# Patient Record
Sex: Female | Born: 1950 | Race: White | Hispanic: No | Marital: Married | Smoking: Former smoker
Health system: Southern US, Community
[De-identification: ages and names within clinical notes are randomized; demographics above are authoritative.]

## PROBLEM LIST (undated history)

## (undated) DIAGNOSIS — E039 Hypothyroidism, unspecified: Secondary | ICD-10-CM

## (undated) DIAGNOSIS — M349 Systemic sclerosis, unspecified: Secondary | ICD-10-CM

## (undated) DIAGNOSIS — R519 Headache, unspecified: Secondary | ICD-10-CM

## (undated) DIAGNOSIS — I10 Essential (primary) hypertension: Secondary | ICD-10-CM

## (undated) DIAGNOSIS — F329 Major depressive disorder, single episode, unspecified: Secondary | ICD-10-CM

## (undated) DIAGNOSIS — F319 Bipolar disorder, unspecified: Secondary | ICD-10-CM

## (undated) DIAGNOSIS — Z87442 Personal history of urinary calculi: Secondary | ICD-10-CM

## (undated) DIAGNOSIS — E042 Nontoxic multinodular goiter: Secondary | ICD-10-CM

## (undated) DIAGNOSIS — M1990 Unspecified osteoarthritis, unspecified site: Secondary | ICD-10-CM

## (undated) DIAGNOSIS — J309 Allergic rhinitis, unspecified: Secondary | ICD-10-CM

## (undated) DIAGNOSIS — Z923 Personal history of irradiation: Secondary | ICD-10-CM

## (undated) DIAGNOSIS — J189 Pneumonia, unspecified organism: Secondary | ICD-10-CM

## (undated) DIAGNOSIS — L4050 Arthropathic psoriasis, unspecified: Secondary | ICD-10-CM

## (undated) DIAGNOSIS — Z8781 Personal history of (healed) traumatic fracture: Secondary | ICD-10-CM

## (undated) DIAGNOSIS — I4510 Unspecified right bundle-branch block: Secondary | ICD-10-CM

## (undated) DIAGNOSIS — Z9889 Other specified postprocedural states: Secondary | ICD-10-CM

## (undated) DIAGNOSIS — F32A Depression, unspecified: Secondary | ICD-10-CM

## (undated) DIAGNOSIS — F4310 Post-traumatic stress disorder, unspecified: Secondary | ICD-10-CM

## (undated) DIAGNOSIS — M810 Age-related osteoporosis without current pathological fracture: Secondary | ICD-10-CM

## (undated) DIAGNOSIS — Z973 Presence of spectacles and contact lenses: Secondary | ICD-10-CM

## (undated) DIAGNOSIS — G4730 Sleep apnea, unspecified: Secondary | ICD-10-CM

## (undated) DIAGNOSIS — E7800 Pure hypercholesterolemia, unspecified: Secondary | ICD-10-CM

## (undated) DIAGNOSIS — I5032 Chronic diastolic (congestive) heart failure: Secondary | ICD-10-CM

## (undated) DIAGNOSIS — IMO0001 Reserved for inherently not codable concepts without codable children: Secondary | ICD-10-CM

## (undated) DIAGNOSIS — E119 Type 2 diabetes mellitus without complications: Secondary | ICD-10-CM

## (undated) DIAGNOSIS — K219 Gastro-esophageal reflux disease without esophagitis: Secondary | ICD-10-CM

## (undated) DIAGNOSIS — D509 Iron deficiency anemia, unspecified: Secondary | ICD-10-CM

## (undated) DIAGNOSIS — E1340 Other specified diabetes mellitus with diabetic neuropathy, unspecified: Secondary | ICD-10-CM

## (undated) DIAGNOSIS — R112 Nausea with vomiting, unspecified: Secondary | ICD-10-CM

## (undated) DIAGNOSIS — H269 Unspecified cataract: Secondary | ICD-10-CM

## (undated) DIAGNOSIS — C801 Malignant (primary) neoplasm, unspecified: Secondary | ICD-10-CM

## (undated) DIAGNOSIS — Z8739 Personal history of other diseases of the musculoskeletal system and connective tissue: Secondary | ICD-10-CM

## (undated) DIAGNOSIS — N200 Calculus of kidney: Secondary | ICD-10-CM

## (undated) DIAGNOSIS — R51 Headache: Secondary | ICD-10-CM

## (undated) HISTORY — DX: Systemic sclerosis, unspecified: M34.9

## (undated) HISTORY — DX: Hypothyroidism, unspecified: E03.9

## (undated) HISTORY — DX: Unspecified cataract: H26.9

## (undated) HISTORY — DX: Age-related osteoporosis without current pathological fracture: M81.0

## (undated) HISTORY — PX: KNEE ARTHROSCOPY WITH MEDIAL MENISECTOMY: SHX5651

## (undated) HISTORY — DX: Chronic diastolic (congestive) heart failure: I50.32

## (undated) HISTORY — DX: Morbid (severe) obesity due to excess calories: E66.01

## (undated) HISTORY — PX: EXCISIONAL HEMORRHOIDECTOMY: SHX1541

## (undated) HISTORY — DX: Allergic rhinitis, unspecified: J30.9

## (undated) HISTORY — PX: JOINT REPLACEMENT: SHX530

## (undated) HISTORY — DX: Arthropathic psoriasis, unspecified: L40.50

## (undated) HISTORY — DX: Reserved for inherently not codable concepts without codable children: IMO0001

## (undated) HISTORY — PX: BREAST SURGERY: SHX581

## (undated) HISTORY — DX: Nontoxic multinodular goiter: E04.2

## (undated) HISTORY — DX: Bipolar disorder, unspecified: F31.9

## (undated) HISTORY — DX: Pure hypercholesterolemia, unspecified: E78.00

## (undated) HISTORY — PX: BREAST EXCISIONAL BIOPSY: SUR124

---

## 1970-04-05 HISTORY — PX: TUBAL LIGATION: SHX77

## 1974-04-05 HISTORY — PX: ABDOMINAL HYSTERECTOMY: SHX81

## 1978-04-05 HISTORY — PX: LEFT OOPHORECTOMY: SHX1961

## 1983-04-06 HISTORY — PX: CHOLECYSTECTOMY: SHX55

## 1988-04-05 HISTORY — PX: OTHER SURGICAL HISTORY: SHX169

## 1988-08-03 HISTORY — PX: OTHER SURGICAL HISTORY: SHX169

## 1989-08-03 HISTORY — PX: OTHER SURGICAL HISTORY: SHX169

## 1990-04-05 HISTORY — PX: OTHER SURGICAL HISTORY: SHX169

## 1995-04-06 HISTORY — PX: OTHER SURGICAL HISTORY: SHX169

## 1997-07-08 ENCOUNTER — Encounter: Admission: RE | Admit: 1997-07-08 | Discharge: 1997-10-06 | Payer: Self-pay | Admitting: *Deleted

## 1997-11-20 ENCOUNTER — Encounter: Admission: RE | Admit: 1997-11-20 | Discharge: 1998-02-18 | Payer: Self-pay | Admitting: *Deleted

## 1998-03-24 ENCOUNTER — Encounter: Admission: RE | Admit: 1998-03-24 | Discharge: 1998-06-22 | Payer: Self-pay | Admitting: *Deleted

## 1998-04-05 HISTORY — PX: INCISIONAL BREAST BIOPSY: SHX1812

## 1998-07-14 ENCOUNTER — Other Ambulatory Visit: Admission: RE | Admit: 1998-07-14 | Discharge: 1998-07-14 | Payer: Self-pay | Admitting: *Deleted

## 1998-07-23 ENCOUNTER — Encounter: Admission: RE | Admit: 1998-07-23 | Discharge: 1998-10-21 | Payer: Self-pay | Admitting: *Deleted

## 1998-10-13 ENCOUNTER — Ambulatory Visit (HOSPITAL_COMMUNITY): Admission: RE | Admit: 1998-10-13 | Discharge: 1998-10-13 | Payer: Self-pay | Admitting: *Deleted

## 1998-10-22 ENCOUNTER — Encounter: Admission: RE | Admit: 1998-10-22 | Discharge: 1998-11-03 | Payer: Self-pay | Admitting: *Deleted

## 1999-04-06 HISTORY — PX: CARDIAC CATHETERIZATION: SHX172

## 1999-11-04 ENCOUNTER — Encounter: Admission: RE | Admit: 1999-11-04 | Discharge: 1999-11-04 | Payer: Self-pay | Admitting: *Deleted

## 1999-11-04 ENCOUNTER — Encounter: Payer: Self-pay | Admitting: *Deleted

## 1999-11-08 ENCOUNTER — Other Ambulatory Visit: Admission: RE | Admit: 1999-11-08 | Discharge: 1999-11-08 | Payer: Self-pay | Admitting: *Deleted

## 1999-12-15 ENCOUNTER — Ambulatory Visit (HOSPITAL_COMMUNITY): Admission: RE | Admit: 1999-12-15 | Discharge: 1999-12-15 | Payer: Self-pay | Admitting: Cardiology

## 2000-12-30 ENCOUNTER — Encounter: Admission: RE | Admit: 2000-12-30 | Discharge: 2000-12-30 | Payer: Self-pay | Admitting: *Deleted

## 2000-12-30 ENCOUNTER — Encounter: Payer: Self-pay | Admitting: *Deleted

## 2001-02-01 ENCOUNTER — Other Ambulatory Visit: Admission: RE | Admit: 2001-02-01 | Discharge: 2001-02-01 | Payer: Self-pay | Admitting: *Deleted

## 2001-08-01 ENCOUNTER — Encounter: Payer: Self-pay | Admitting: *Deleted

## 2001-08-01 ENCOUNTER — Encounter: Admission: RE | Admit: 2001-08-01 | Discharge: 2001-08-01 | Payer: Self-pay | Admitting: *Deleted

## 2002-01-09 ENCOUNTER — Encounter: Admission: RE | Admit: 2002-01-09 | Discharge: 2002-01-09 | Payer: Self-pay

## 2002-02-19 ENCOUNTER — Other Ambulatory Visit: Admission: RE | Admit: 2002-02-19 | Discharge: 2002-02-19 | Payer: Self-pay | Admitting: Family Medicine

## 2002-02-22 ENCOUNTER — Encounter: Admission: RE | Admit: 2002-02-22 | Discharge: 2002-02-22 | Payer: Self-pay

## 2003-01-17 ENCOUNTER — Encounter: Admission: RE | Admit: 2003-01-17 | Discharge: 2003-01-17 | Payer: Self-pay

## 2003-03-21 ENCOUNTER — Other Ambulatory Visit: Admission: RE | Admit: 2003-03-21 | Discharge: 2003-04-01 | Payer: Self-pay

## 2004-01-21 ENCOUNTER — Encounter: Admission: RE | Admit: 2004-01-21 | Discharge: 2004-01-21 | Payer: Self-pay | Admitting: Physician Assistant

## 2004-09-03 LAB — HM COLONOSCOPY: HM Colonoscopy: NORMAL

## 2005-02-18 ENCOUNTER — Encounter: Admission: RE | Admit: 2005-02-18 | Discharge: 2005-02-18 | Payer: Self-pay | Admitting: Physician Assistant

## 2006-01-05 ENCOUNTER — Encounter: Admission: RE | Admit: 2006-01-05 | Discharge: 2006-01-05 | Payer: Self-pay | Admitting: Family Medicine

## 2006-02-21 ENCOUNTER — Encounter: Admission: RE | Admit: 2006-02-21 | Discharge: 2006-02-21 | Payer: Self-pay | Admitting: Family Medicine

## 2006-07-26 ENCOUNTER — Encounter: Admission: RE | Admit: 2006-07-26 | Discharge: 2006-07-26 | Payer: Self-pay | Admitting: Family Medicine

## 2006-08-26 ENCOUNTER — Encounter: Admission: RE | Admit: 2006-08-26 | Discharge: 2006-08-26 | Payer: Self-pay | Admitting: Family Medicine

## 2007-03-14 ENCOUNTER — Encounter: Admission: RE | Admit: 2007-03-14 | Discharge: 2007-03-14 | Payer: Self-pay | Admitting: Family Medicine

## 2007-03-22 ENCOUNTER — Encounter: Admission: RE | Admit: 2007-03-22 | Discharge: 2007-03-22 | Payer: Self-pay | Admitting: Family Medicine

## 2007-04-10 ENCOUNTER — Encounter: Admission: RE | Admit: 2007-04-10 | Discharge: 2007-04-10 | Payer: Self-pay | Admitting: Family Medicine

## 2007-11-02 ENCOUNTER — Encounter: Admission: RE | Admit: 2007-11-02 | Discharge: 2007-11-02 | Payer: Self-pay | Admitting: Family Medicine

## 2007-11-15 ENCOUNTER — Encounter: Admission: RE | Admit: 2007-11-15 | Discharge: 2007-11-15 | Payer: Self-pay | Admitting: Internal Medicine

## 2008-03-20 ENCOUNTER — Encounter: Admission: RE | Admit: 2008-03-20 | Discharge: 2008-03-20 | Payer: Self-pay | Admitting: Family Medicine

## 2008-06-10 ENCOUNTER — Ambulatory Visit: Payer: Self-pay | Admitting: Endocrinology

## 2008-06-10 DIAGNOSIS — J309 Allergic rhinitis, unspecified: Secondary | ICD-10-CM | POA: Insufficient documentation

## 2008-06-10 DIAGNOSIS — I10 Essential (primary) hypertension: Secondary | ICD-10-CM | POA: Insufficient documentation

## 2008-06-10 DIAGNOSIS — F319 Bipolar disorder, unspecified: Secondary | ICD-10-CM | POA: Insufficient documentation

## 2008-06-10 DIAGNOSIS — E1159 Type 2 diabetes mellitus with other circulatory complications: Secondary | ICD-10-CM | POA: Insufficient documentation

## 2008-06-10 DIAGNOSIS — E039 Hypothyroidism, unspecified: Secondary | ICD-10-CM | POA: Insufficient documentation

## 2008-06-10 DIAGNOSIS — E7800 Pure hypercholesterolemia, unspecified: Secondary | ICD-10-CM | POA: Insufficient documentation

## 2008-06-10 DIAGNOSIS — Z780 Asymptomatic menopausal state: Secondary | ICD-10-CM | POA: Insufficient documentation

## 2008-06-10 DIAGNOSIS — I152 Hypertension secondary to endocrine disorders: Secondary | ICD-10-CM | POA: Insufficient documentation

## 2008-06-11 ENCOUNTER — Telehealth (INDEPENDENT_AMBULATORY_CARE_PROVIDER_SITE_OTHER): Payer: Self-pay | Admitting: *Deleted

## 2008-06-24 ENCOUNTER — Ambulatory Visit: Payer: Self-pay | Admitting: Endocrinology

## 2008-07-25 ENCOUNTER — Ambulatory Visit: Payer: Self-pay | Admitting: Endocrinology

## 2008-07-26 LAB — HM DIABETES EYE EXAM: HM Diabetic Eye Exam: NORMAL

## 2008-09-03 ENCOUNTER — Encounter (INDEPENDENT_AMBULATORY_CARE_PROVIDER_SITE_OTHER): Payer: Self-pay | Admitting: *Deleted

## 2008-09-03 ENCOUNTER — Ambulatory Visit: Payer: Self-pay | Admitting: Internal Medicine

## 2008-09-03 LAB — CONVERTED CEMR LAB
ALT: 33 units/L (ref 0–35)
AST: 37 units/L (ref 0–37)
Albumin: 3.5 g/dL (ref 3.5–5.2)
Alkaline Phosphatase: 130 units/L — ABNORMAL HIGH (ref 39–117)
BUN: 11 mg/dL (ref 6–23)
Bilirubin Urine: NEGATIVE
Bilirubin, Direct: 0.1 mg/dL (ref 0.0–0.3)
CO2: 30 meq/L (ref 19–32)
Calcium: 9.3 mg/dL (ref 8.4–10.5)
Chloride: 103 meq/L (ref 96–112)
Cholesterol: 115 mg/dL (ref 0–200)
Creatinine, Ser: 0.8 mg/dL (ref 0.4–1.2)
GFR calc non Af Amer: 78.28 mL/min (ref >60)
Glucose, Bld: 368 mg/dL — ABNORMAL HIGH (ref 70–99)
HDL: 53.5 mg/dL (ref >39.00)
Hemoglobin, Urine: NEGATIVE
Hgb A1c MFr Bld: 8.4 % — ABNORMAL HIGH (ref 4.6–6.5)
Ketones, ur: NEGATIVE mg/dL
LDL Cholesterol: 42 mg/dL (ref 0–99)
Nitrite: NEGATIVE
Potassium: 4 meq/L (ref 3.5–5.1)
Sodium: 138 meq/L (ref 135–145)
Specific Gravity, Urine: 1.01 (ref 1.000–1.030)
TSH: 1.05 microintl units/mL (ref 0.35–5.50)
Total Bilirubin: 0.6 mg/dL (ref 0.3–1.2)
Total CHOL/HDL Ratio: 2
Total Protein, Urine: NEGATIVE mg/dL
Total Protein: 6.6 g/dL (ref 6.0–8.3)
Triglycerides: 97 mg/dL (ref 0.0–149.0)
Urine Glucose: >=1000 mg/dL
Urobilinogen, UA: 0.2 (ref 0.0–1.0)
VLDL: 19.4 mg/dL (ref 0.0–40.0)
pH: 7.5 (ref 5.0–8.0)

## 2008-09-06 ENCOUNTER — Ambulatory Visit: Payer: Self-pay | Admitting: Endocrinology

## 2008-09-06 ENCOUNTER — Ambulatory Visit: Payer: Self-pay

## 2008-09-06 DIAGNOSIS — R82991 Hypocitraturia: Secondary | ICD-10-CM | POA: Insufficient documentation

## 2008-09-06 DIAGNOSIS — R609 Edema, unspecified: Secondary | ICD-10-CM | POA: Insufficient documentation

## 2008-09-09 ENCOUNTER — Telehealth: Payer: Self-pay | Admitting: Internal Medicine

## 2008-09-18 ENCOUNTER — Telehealth (INDEPENDENT_AMBULATORY_CARE_PROVIDER_SITE_OTHER): Payer: Self-pay | Admitting: *Deleted

## 2008-09-18 ENCOUNTER — Telehealth: Payer: Self-pay | Admitting: Endocrinology

## 2008-09-23 ENCOUNTER — Telehealth (INDEPENDENT_AMBULATORY_CARE_PROVIDER_SITE_OTHER): Payer: Self-pay | Admitting: *Deleted

## 2008-09-25 ENCOUNTER — Ambulatory Visit: Payer: Self-pay | Admitting: Internal Medicine

## 2008-09-25 ENCOUNTER — Ambulatory Visit: Payer: Self-pay | Admitting: Endocrinology

## 2008-09-26 ENCOUNTER — Encounter (INDEPENDENT_AMBULATORY_CARE_PROVIDER_SITE_OTHER): Payer: Self-pay | Admitting: *Deleted

## 2008-09-26 ENCOUNTER — Encounter: Payer: Self-pay | Admitting: Internal Medicine

## 2008-09-26 DIAGNOSIS — E876 Hypokalemia: Secondary | ICD-10-CM | POA: Insufficient documentation

## 2008-09-26 DIAGNOSIS — R7401 Elevation of levels of liver transaminase levels: Secondary | ICD-10-CM | POA: Insufficient documentation

## 2008-09-26 DIAGNOSIS — R740 Nonspecific elevation of levels of transaminase and lactic acid dehydrogenase [LDH]: Secondary | ICD-10-CM

## 2008-09-26 DIAGNOSIS — R7402 Elevation of levels of lactic acid dehydrogenase (LDH): Secondary | ICD-10-CM | POA: Insufficient documentation

## 2008-09-26 LAB — CONVERTED CEMR LAB
ALT: 38 units/L — ABNORMAL HIGH (ref 0–35)
AST: 46 units/L — ABNORMAL HIGH (ref 0–37)
Albumin: 3.5 g/dL (ref 3.5–5.2)
Alkaline Phosphatase: 128 units/L — ABNORMAL HIGH (ref 39–117)
BUN: 10 mg/dL (ref 6–23)
Bilirubin Urine: NEGATIVE
Bilirubin, Direct: 0.1 mg/dL (ref 0.0–0.3)
CO2: 31 meq/L (ref 19–32)
Calcium: 9.3 mg/dL (ref 8.4–10.5)
Chloride: 96 meq/L (ref 96–112)
Creatinine, Ser: 0.8 mg/dL (ref 0.4–1.2)
GFR calc non Af Amer: 78.27 mL/min (ref >60)
Glucose, Bld: 259 mg/dL — ABNORMAL HIGH (ref 70–99)
Hemoglobin, Urine: NEGATIVE
Ketones, ur: NEGATIVE mg/dL
Leukocytes, UA: NEGATIVE
Nitrite: NEGATIVE
Potassium: 3.3 meq/L — ABNORMAL LOW (ref 3.5–5.1)
Sodium: 137 meq/L (ref 135–145)
Specific Gravity, Urine: <=1.005 (ref 1.000–1.030)
Total Bilirubin: 0.4 mg/dL (ref 0.3–1.2)
Total Protein, Urine: NEGATIVE mg/dL
Total Protein: 6.8 g/dL (ref 6.0–8.3)
Urine Glucose: 100 mg/dL
Urobilinogen, UA: 0.2 (ref 0.0–1.0)
pH: 7 (ref 5.0–8.0)

## 2008-09-30 ENCOUNTER — Encounter: Admission: RE | Admit: 2008-09-30 | Discharge: 2008-09-30 | Payer: Self-pay | Admitting: Internal Medicine

## 2008-09-30 ENCOUNTER — Encounter (INDEPENDENT_AMBULATORY_CARE_PROVIDER_SITE_OTHER): Payer: Self-pay | Admitting: *Deleted

## 2008-10-22 ENCOUNTER — Telehealth: Payer: Self-pay | Admitting: Internal Medicine

## 2008-10-22 ENCOUNTER — Telehealth: Payer: Self-pay | Admitting: Endocrinology

## 2008-10-29 ENCOUNTER — Ambulatory Visit: Payer: Self-pay | Admitting: Internal Medicine

## 2008-10-29 ENCOUNTER — Telehealth: Payer: Self-pay | Admitting: Internal Medicine

## 2008-10-29 DIAGNOSIS — M25476 Effusion, unspecified foot: Secondary | ICD-10-CM | POA: Insufficient documentation

## 2008-10-29 DIAGNOSIS — M25473 Effusion, unspecified ankle: Secondary | ICD-10-CM | POA: Insufficient documentation

## 2008-10-31 ENCOUNTER — Encounter: Payer: Self-pay | Admitting: Internal Medicine

## 2008-10-31 LAB — CONVERTED CEMR LAB
BUN: 12 mg/dL (ref 6–23)
Basophils Absolute: 0.1 10*3/uL (ref 0.0–0.1)
Basophils Relative: 0.9 % (ref 0.0–3.0)
CO2: 35 meq/L — ABNORMAL HIGH (ref 19–32)
Calcium: 9.6 mg/dL (ref 8.4–10.5)
Chloride: 93 meq/L — ABNORMAL LOW (ref 96–112)
Creatinine, Ser: 0.9 mg/dL (ref 0.4–1.2)
Eosinophils Absolute: 0.4 10*3/uL (ref 0.0–0.7)
Eosinophils Relative: 4.1 % (ref 0.0–5.0)
GFR calc non Af Amer: 68.3 mL/min (ref >60)
Glucose, Bld: 124 mg/dL — ABNORMAL HIGH (ref 70–99)
HCT: 39.6 % (ref 36.0–46.0)
Hemoglobin: 13.1 g/dL (ref 12.0–15.0)
Lymphocytes Relative: 33.5 % (ref 12.0–46.0)
Lymphs Abs: 3.2 10*3/uL (ref 0.7–4.0)
MCHC: 33 g/dL (ref 30.0–36.0)
MCV: 82.9 fL (ref 78.0–100.0)
Monocytes Absolute: 0.6 10*3/uL (ref 0.1–1.0)
Monocytes Relative: 6.7 % (ref 3.0–12.0)
Neutro Abs: 5.4 10*3/uL (ref 1.4–7.7)
Neutrophils Relative %: 54.8 % (ref 43.0–77.0)
Platelets: 385 10*3/uL (ref 150.0–400.0)
Potassium: 2.8 meq/L — CL (ref 3.5–5.1)
RBC: 4.78 M/uL (ref 3.87–5.11)
RDW: 14 % (ref 11.5–14.6)
Sed Rate: 35 mm/hr — ABNORMAL HIGH (ref 0–22)
Sodium: 140 meq/L (ref 135–145)
WBC: 9.7 10*3/uL (ref 4.5–10.5)

## 2008-11-01 ENCOUNTER — Ambulatory Visit: Payer: Self-pay | Admitting: Internal Medicine

## 2008-11-01 ENCOUNTER — Telehealth: Payer: Self-pay | Admitting: Internal Medicine

## 2008-11-01 LAB — CONVERTED CEMR LAB: CRP, High Sensitivity: 31.6 — ABNORMAL HIGH

## 2008-11-02 LAB — CONVERTED CEMR LAB
Magnesium: 2 mg/dL (ref 1.5–2.5)
Potassium: 3 meq/L — ABNORMAL LOW (ref 3.5–5.1)

## 2008-11-04 ENCOUNTER — Telehealth: Payer: Self-pay | Admitting: Internal Medicine

## 2008-11-04 ENCOUNTER — Ambulatory Visit: Payer: Self-pay

## 2008-11-04 ENCOUNTER — Encounter: Payer: Self-pay | Admitting: Internal Medicine

## 2008-11-13 ENCOUNTER — Telehealth: Payer: Self-pay | Admitting: Internal Medicine

## 2008-11-26 ENCOUNTER — Ambulatory Visit: Payer: Self-pay | Admitting: Internal Medicine

## 2008-11-26 ENCOUNTER — Telehealth: Payer: Self-pay | Admitting: Internal Medicine

## 2008-11-27 LAB — CONVERTED CEMR LAB
BUN: 10 mg/dL (ref 6–23)
CO2: 32 meq/L (ref 19–32)
Calcium: 9.5 mg/dL (ref 8.4–10.5)
Chloride: 100 meq/L (ref 96–112)
Creatinine, Ser: 0.8 mg/dL (ref 0.4–1.2)
GFR calc non Af Amer: 78.22 mL/min (ref >60)
Glucose, Bld: 216 mg/dL — ABNORMAL HIGH (ref 70–99)
Potassium: 3.2 meq/L — ABNORMAL LOW (ref 3.5–5.1)
Sodium: 140 meq/L (ref 135–145)

## 2008-12-02 ENCOUNTER — Telehealth (INDEPENDENT_AMBULATORY_CARE_PROVIDER_SITE_OTHER): Payer: Self-pay | Admitting: *Deleted

## 2008-12-16 ENCOUNTER — Telehealth: Payer: Self-pay | Admitting: Internal Medicine

## 2008-12-20 ENCOUNTER — Telehealth: Payer: Self-pay | Admitting: Family Medicine

## 2008-12-24 ENCOUNTER — Ambulatory Visit: Payer: Self-pay | Admitting: Endocrinology

## 2008-12-24 ENCOUNTER — Ambulatory Visit: Payer: Self-pay | Admitting: Internal Medicine

## 2008-12-24 ENCOUNTER — Encounter: Admission: RE | Admit: 2008-12-24 | Discharge: 2008-12-24 | Payer: Self-pay | Admitting: Endocrinology

## 2008-12-24 DIAGNOSIS — I5032 Chronic diastolic (congestive) heart failure: Secondary | ICD-10-CM | POA: Insufficient documentation

## 2008-12-24 LAB — CONVERTED CEMR LAB
BUN: 9 mg/dL (ref 6–23)
CO2: 32 meq/L (ref 19–32)
Calcium: 9.5 mg/dL (ref 8.4–10.5)
Chloride: 100 meq/L (ref 96–112)
Creatinine, Ser: 0.8 mg/dL (ref 0.4–1.2)
GFR calc non Af Amer: 78.2 mL/min (ref >60)
Glucose, Bld: 190 mg/dL — ABNORMAL HIGH (ref 70–99)
Potassium: 3.7 meq/L (ref 3.5–5.1)
Sodium: 140 meq/L (ref 135–145)

## 2008-12-25 LAB — CONVERTED CEMR LAB
Creatinine,U: 11 mg/dL
Hgb A1c MFr Bld: 7.7 % — ABNORMAL HIGH (ref 4.6–6.5)
Microalb Creat Ratio: 18.2 mg/g (ref 0.0–30.0)
Microalb, Ur: 0.2 mg/dL (ref 0.0–1.9)

## 2009-01-03 ENCOUNTER — Telehealth: Payer: Self-pay | Admitting: Endocrinology

## 2009-01-13 ENCOUNTER — Ambulatory Visit: Payer: Self-pay | Admitting: Endocrinology

## 2009-01-13 ENCOUNTER — Encounter: Payer: Self-pay | Admitting: Endocrinology

## 2009-01-13 ENCOUNTER — Other Ambulatory Visit: Admission: RE | Admit: 2009-01-13 | Discharge: 2009-01-13 | Payer: Self-pay | Admitting: Endocrinology

## 2009-01-17 ENCOUNTER — Ambulatory Visit: Payer: Self-pay | Admitting: Endocrinology

## 2009-01-26 DIAGNOSIS — S8000XA Contusion of unspecified knee, initial encounter: Secondary | ICD-10-CM | POA: Insufficient documentation

## 2009-02-11 ENCOUNTER — Telehealth: Payer: Self-pay | Admitting: Endocrinology

## 2009-02-17 ENCOUNTER — Telehealth: Payer: Self-pay | Admitting: Internal Medicine

## 2009-03-06 ENCOUNTER — Telehealth: Payer: Self-pay | Admitting: Internal Medicine

## 2009-03-07 ENCOUNTER — Telehealth (INDEPENDENT_AMBULATORY_CARE_PROVIDER_SITE_OTHER): Payer: Self-pay | Admitting: *Deleted

## 2009-03-23 ENCOUNTER — Encounter: Payer: Self-pay | Admitting: Endocrinology

## 2009-03-24 ENCOUNTER — Ambulatory Visit: Payer: Self-pay | Admitting: Internal Medicine

## 2009-03-24 ENCOUNTER — Ambulatory Visit: Payer: Self-pay | Admitting: Endocrinology

## 2009-03-24 DIAGNOSIS — E042 Nontoxic multinodular goiter: Secondary | ICD-10-CM | POA: Insufficient documentation

## 2009-03-24 DIAGNOSIS — E049 Nontoxic goiter, unspecified: Secondary | ICD-10-CM | POA: Insufficient documentation

## 2009-03-24 LAB — CONVERTED CEMR LAB: Hgb A1c MFr Bld: 7.6 % — ABNORMAL HIGH (ref 4.6–6.5)

## 2009-04-25 ENCOUNTER — Encounter: Admission: RE | Admit: 2009-04-25 | Discharge: 2009-04-25 | Payer: Self-pay | Admitting: Internal Medicine

## 2009-04-25 LAB — HM MAMMOGRAPHY: HM Mammogram: NEGATIVE

## 2009-05-13 ENCOUNTER — Encounter: Payer: Self-pay | Admitting: Endocrinology

## 2009-05-14 ENCOUNTER — Telehealth: Payer: Self-pay | Admitting: Endocrinology

## 2009-05-23 ENCOUNTER — Ambulatory Visit: Payer: Self-pay | Admitting: Internal Medicine

## 2009-05-23 DIAGNOSIS — K120 Recurrent oral aphthae: Secondary | ICD-10-CM | POA: Insufficient documentation

## 2009-06-16 ENCOUNTER — Ambulatory Visit: Payer: Self-pay | Admitting: Endocrinology

## 2009-06-16 LAB — CONVERTED CEMR LAB: Hgb A1c MFr Bld: 7.8 % — ABNORMAL HIGH (ref 4.6–6.5)

## 2009-06-17 ENCOUNTER — Encounter: Admission: RE | Admit: 2009-06-17 | Discharge: 2009-06-17 | Payer: Self-pay | Admitting: Endocrinology

## 2009-07-10 ENCOUNTER — Telehealth: Payer: Self-pay | Admitting: Endocrinology

## 2009-07-28 ENCOUNTER — Telehealth: Payer: Self-pay | Admitting: Internal Medicine

## 2009-08-03 HISTORY — PX: KNEE ARTHROSCOPY: SUR90

## 2009-08-11 ENCOUNTER — Ambulatory Visit (HOSPITAL_BASED_OUTPATIENT_CLINIC_OR_DEPARTMENT_OTHER): Admission: RE | Admit: 2009-08-11 | Discharge: 2009-08-11 | Payer: Self-pay | Admitting: Orthopedic Surgery

## 2009-08-14 ENCOUNTER — Telehealth: Payer: Self-pay | Admitting: Internal Medicine

## 2009-08-14 ENCOUNTER — Telehealth: Payer: Self-pay | Admitting: Endocrinology

## 2009-08-18 ENCOUNTER — Telehealth: Payer: Self-pay | Admitting: Internal Medicine

## 2009-08-25 ENCOUNTER — Ambulatory Visit (HOSPITAL_COMMUNITY)
Admission: RE | Admit: 2009-08-25 | Discharge: 2009-08-25 | Payer: Self-pay | Source: Home / Self Care | Admitting: Orthopaedic Surgery

## 2009-08-25 ENCOUNTER — Encounter (INDEPENDENT_AMBULATORY_CARE_PROVIDER_SITE_OTHER): Payer: Self-pay | Admitting: Orthopaedic Surgery

## 2009-08-25 ENCOUNTER — Ambulatory Visit: Payer: Self-pay | Admitting: Vascular Surgery

## 2009-09-15 ENCOUNTER — Ambulatory Visit: Payer: Self-pay | Admitting: Internal Medicine

## 2009-09-18 ENCOUNTER — Ambulatory Visit: Payer: Self-pay | Admitting: Internal Medicine

## 2009-09-19 ENCOUNTER — Telehealth: Payer: Self-pay | Admitting: Internal Medicine

## 2009-10-10 ENCOUNTER — Ambulatory Visit: Payer: Self-pay | Admitting: Internal Medicine

## 2009-10-10 DIAGNOSIS — L259 Unspecified contact dermatitis, unspecified cause: Secondary | ICD-10-CM | POA: Insufficient documentation

## 2009-10-13 ENCOUNTER — Ambulatory Visit: Payer: Self-pay | Admitting: Endocrinology

## 2009-10-13 LAB — CONVERTED CEMR LAB: Hgb A1c MFr Bld: 7.8 % — ABNORMAL HIGH (ref 4.6–6.5)

## 2009-11-27 ENCOUNTER — Telehealth: Payer: Self-pay | Admitting: Internal Medicine

## 2009-11-27 ENCOUNTER — Ambulatory Visit: Payer: Self-pay | Admitting: Internal Medicine

## 2009-11-27 DIAGNOSIS — J329 Chronic sinusitis, unspecified: Secondary | ICD-10-CM | POA: Insufficient documentation

## 2009-12-22 ENCOUNTER — Encounter: Payer: Self-pay | Admitting: Internal Medicine

## 2009-12-30 ENCOUNTER — Encounter: Payer: Self-pay | Admitting: Internal Medicine

## 2010-01-02 ENCOUNTER — Ambulatory Visit: Payer: Self-pay | Admitting: Internal Medicine

## 2010-02-09 ENCOUNTER — Telehealth: Payer: Self-pay | Admitting: Internal Medicine

## 2010-03-18 ENCOUNTER — Telehealth: Payer: Self-pay | Admitting: Endocrinology

## 2010-03-18 ENCOUNTER — Telehealth: Payer: Self-pay | Admitting: Internal Medicine

## 2010-03-19 ENCOUNTER — Telehealth: Payer: Self-pay | Admitting: Internal Medicine

## 2010-03-23 ENCOUNTER — Ambulatory Visit: Payer: Self-pay | Admitting: Endocrinology

## 2010-03-23 ENCOUNTER — Ambulatory Visit: Payer: Self-pay | Admitting: Internal Medicine

## 2010-03-23 DIAGNOSIS — IMO0001 Reserved for inherently not codable concepts without codable children: Secondary | ICD-10-CM

## 2010-03-23 DIAGNOSIS — M797 Fibromyalgia: Secondary | ICD-10-CM | POA: Insufficient documentation

## 2010-03-23 LAB — CONVERTED CEMR LAB: Hgb A1c MFr Bld: 7.5 % — ABNORMAL HIGH (ref 4.6–6.5)

## 2010-03-24 LAB — CONVERTED CEMR LAB
ALT: 28 units/L (ref 0–35)
AST: 31 units/L (ref 0–37)
Albumin: 3.4 g/dL — ABNORMAL LOW (ref 3.5–5.2)
Alkaline Phosphatase: 104 units/L (ref 39–117)
BUN: 16 mg/dL (ref 6–23)
Bilirubin, Direct: 0.1 mg/dL (ref 0.0–0.3)
CO2: 30 meq/L (ref 19–32)
Calcium: 9.1 mg/dL (ref 8.4–10.5)
Chloride: 99 meq/L (ref 96–112)
Cholesterol: 122 mg/dL (ref 0–200)
Creatinine, Ser: 0.9 mg/dL (ref 0.4–1.2)
Direct LDL: 52.3 mg/dL
GFR calc non Af Amer: 67.11 mL/min (ref >60.00)
Glucose, Bld: 81 mg/dL (ref 70–99)
HDL: 43.5 mg/dL (ref >39.00)
Potassium: 3.9 meq/L (ref 3.5–5.1)
Sodium: 140 meq/L (ref 135–145)
Total Bilirubin: 0.5 mg/dL (ref 0.3–1.2)
Total CHOL/HDL Ratio: 3
Total Protein: 5.9 g/dL — ABNORMAL LOW (ref 6.0–8.3)
Triglycerides: 205 mg/dL — ABNORMAL HIGH (ref 0.0–149.0)
VLDL: 41 mg/dL — ABNORMAL HIGH (ref 0.0–40.0)

## 2010-03-26 ENCOUNTER — Telehealth: Payer: Self-pay | Admitting: Endocrinology

## 2010-03-31 ENCOUNTER — Telehealth: Payer: Self-pay | Admitting: Internal Medicine

## 2010-04-26 ENCOUNTER — Encounter: Payer: Self-pay | Admitting: Internal Medicine

## 2010-04-27 ENCOUNTER — Encounter: Payer: Self-pay | Admitting: Family Medicine

## 2010-05-03 LAB — CONVERTED CEMR LAB: Pro B Natriuretic peptide (BNP): 16 pg/mL (ref 0.0–100.0)

## 2010-05-07 NOTE — Assessment & Plan Note (Signed)
Summary: 1 MO ROV /NWS $50-PER PT RS TO 6-8 WK FU-D/T-VAC-STC   Vital Signs:  Patient profile:   60 year old female Height:      63 inches Weight:      243 pounds O2 Sat:      97 % on Room air Temp:     97.5 degrees F oral Pulse rate:   85 / minute Pulse rhythm:   regular BP sitting:   122 / 82  (left arm)  Vitals Entered By: Josph Macho (December 24, 2008 8:04 AM)  O2 Flow:  Room air CC: F/u spikes in sugar at dinner between 200-300 Is Patient Diabetic? Yes  Comments needs refill for metformin - cf   Referring Katira Dumais:  Clancy Gourd Primary Amaan Meyer:  Newt Lukes MD  CC:  F/u spikes in sugar at dinner between 200-300.  History of Present Illness: pt states she feels well in general, except for ongoing leg swelling.  no cbg record, but states cbg's are sometimes low in the afternoon (60's).  it is overall well-controlled.   pt states 3 weeks of slight pain at the anterior neck, and associated swelling   Current Medications (verified): 1)  Metformin Hcl 1000 Mg Tabs (Metformin Hcl) .Marland Kitchen.. 1 By Mouth Qam, 1 By Mouth Qpm 2)  Lipitor 20 Mg Tabs (Atorvastatin Calcium) .Marland Kitchen.. 1 By Mouth Once Daily 3)  Premarin 0.3 Mg Tabs (Estrogens Conjugated) .Marland Kitchen.. 1 By Mouth Qd 4)  Synthroid 50 Mcg Tabs (Levothyroxine Sodium) .Marland Kitchen.. 1 By Mouth Qd 5)  Urocit-K 10 1080 Mg Cr-Tabs (Potassium Citrate) .... 2 By Mouth Qam, 2 By Mouth Qpm 6)  Hydrochlorothiazide 25 Mg Tabs (Hydrochlorothiazide) .Marland Kitchen.. 1 By Mouth Qam 7)  Effexor Xr 150 Mg Xr24h-Cap (Venlafaxine Hcl) .Marland Kitchen.. 1 By Mouth Qam 8)  Abilify 20 Mg Tabs (Aripiprazole) .Marland Kitchen.. 1 By Mouth At Bedtime 9)  Lamotrigine 200 Mg Tabs (Lamotrigine) .Marland Kitchen.. 1 By Mouth Q Noon 10)  Clonazepam 1 Mg Tabs (Clonazepam) .Marland Kitchen.. 1 By Mouth At Bedtime 11)  Seroquel Xr 150 Mg Xr24h-Tab (Quetiapine Fumarate) .Marland Kitchen.. 1 in Am, 3 At Bedtime 12)  Fexofenadine Hcl 180 Mg Tabs (Fexofenadine Hcl) .Marland Kitchen.. 1 By Mouth Q Am 13)  Mirtazapine 30 Mg Tabs (Mirtazapine) .Marland Kitchen.. 1 By Mouth  Qhs 14)  Multivitamins  Caps (Multiple Vitamin) .Marland Kitchen.. 1 By Mouth Once Daily 15)  Vitamin D-3  1000 Iu .Marland Kitchen.. 1 By Mouth At Bedtime 16)  Humalog 100 Unit/ml Soln (Insulin Lispro (Human)) .... 4 Times A Day (Qac) 03-11-11- Units 17)  Topiramate 100 Mg Tabs (Topiramate) .Marland Kitchen.. 1 Tab Bid 18)  Novolin N 100 Unit/ml Susp (Insulin Isophane Human) .Marland Kitchen.. 15 Units Qhs 19)  Furosemide 40 Mg Tabs (Furosemide) .Marland Kitchen.. 1 By Mouth Qd 20)  Onetouch Ultra Test  Strp (Glucose Blood) .Marland Kitchen.. 1 Strip 4 Times A Day 21)  Aspirin 81 Mg Tabs (Aspirin) .... Take 1 By Mouth Qd 22)  Potassium Chloride 20 Meq Pack (Potassium Chloride) .Marland Kitchen.. 1 By Mouth Once Daily 23)  Allopurinol 100 Mg Tabs (Allopurinol) .... 2 A Day  Allergies (verified): No Known Drug Allergies  Past History:  Past Medical History: Last updated: 09/03/2008 IDDM (ICD-250.01) ALLERGIC RHINITIS (ICD-477.9) ASYMPTOMATIC POSTMENOPAUSAL STATUS (ICD-V49.81) HYPERCHOLESTEROLEMIA (ICD-272.0) HYPERTENSION (ICD-401.9) BIPOLAR DISORDER UNSPECIFIED (ICD-296.80) HYPOTHYROIDISM (ICD-244.9)  physician roster: Dr. Bryson Ha at [facility]. Dr. Turner Daniels at [facility] orthopedics Dr. Edison Pace Dr. Artist Beach Dr. Lavonia Drafts  Review of Systems       denies difficulty swallowing or breathing  Physical Exam  General:  morbidly  obese.   Neck:  thyroid is probably 3x normal size, left > right.  i cannot appreciate a  nodule   Impression & Recommendations:  Problem # 1:  IDDM (ICD-250.01) apparently well-controlled  Problem # 2:  GOITER (ICD-240.9)  Medications Added to Medication List This Visit: 1)  Humalog 100 Unit/ml Soln (Insulin lispro (human)) .... 4 times a day (qac) 03-11-11- units 2)  Novolin N 100 Unit/ml Susp (Insulin isophane human) .... 20 units qhs 3)  Allopurinol 100 Mg Tabs (Allopurinol) .... 2 a day  Other Orders: TLB-A1C / Hgb A1C (Glycohemoglobin) (83036-A1C) TLB-Microalbumin/Creat Ratio, Urine (82043-MALB) Radiology  Referral (Radiology) Est. Patient Level IV (91478)  Patient Instructions: 1)  decrease humalog to three times a day (just before each meal) 03-11-11 units 2)  continue nph 20 units at bedtime. 3)  return 3 months 4)  thyroid ultrasound 5)  tests are being ordered for you today.  a few days after the test(s), please call [phone] to hear your test results.  Appended Document: 1 MO ROV /NWS $50-PER PT RS TO 6-8 WK FU-D/T-VAC-STC [facility] (throid [place]) 12/24/08 report in emr .

## 2010-05-07 NOTE — Progress Notes (Signed)
Summary: [facility]  Phone Note From Other Clinic   Summary of Call: Received paperwork for Therapeutic shoes from [facility]. Paperwork on MD's desk Initial call taken by: Josph Macho CMA,  March 07, 2009 10:47 AM     Appended Document: [facility] Faxed paperwork and sent to scanning.

## 2010-05-07 NOTE — Letter (Signed)
Summary: Results Follow-up Letter  [facility]  [address] [place], [place] [address]   Phone: [phone]  Fax: [phone]    09/26/2008  [address] [place], [place]  [address]  Dear Ms. Nydam,   The following are the results of your recent test(s):  Test     Result     Metabol           Mild hypokalemia please take the potassium that [facility]                              [facility] Will re-check levels at your next office visit.  UDIP              No proteinuria Liver functions   Mild elevation of alkaline phosphatase and newly mild elevated tranaminase. Will arrange for abdominal ultrasound. Attached a copy of your labs for your records.     Dr. Rene Paci

## 2010-05-07 NOTE — Progress Notes (Signed)
Summary: Rx alt  Phone Note Call from Patient Call back at Home Phone [phone]   Caller: Patient Summary of Call: pt called stating that after 05/15 her Insurance co will no longer cover Rx fro Allegra. Pt requested #30 Allergra and and alternative medication be sent to pharmacy that can be used after 05/15. Please advise on alt Initial call taken by: Margaret Pyle, CMA,  July 28, 2009 3:48 PM  Follow-up for Phone Call        ask pt to check with her formulary for names of covered antihistamines - i am unaware of any that are covered by insurance since they are available OTC - if pt has the name of something she would like to try that is covered, let us know and i will change it if it is an appropriate tx option - thanks - ok to send #30 allegra as requested (already done)-  Follow-up by: Newt Lukes MD,  July 28, 2009 4:22 PM  Additional Follow-up for Phone Call Additional follow up Details #1::        pt advised and will call back with name of medications covered by Ins Additional Follow-up by: Margaret Pyle, CMA,  July 28, 2009 4:36 PM    Prescriptions: FEXOFENADINE HCL 180 MG TABS (FEXOFENADINE HCL) 1 by mouth Q AM  #30 x 0   Entered and Authorized by:   Newt Lukes MD   Signed by:   Newt Lukes MD on 07/28/2009   Method used:   [facility] to        [facility] [phone][facility] (retail)       [address] [place], [place]  [address]       Ph: [phone] or [phone]       Fax: [phone]   RxID:   [id number]

## 2010-05-07 NOTE — Letter (Signed)
Summary: Physical Exam/[facility]CD[facility]  Physical Exam/[facility]CD[facility]   Imported By: Sherian Rein 09/23/2009 13:26:58  _____________________________________________________________________  External Attachment:    Type:   Image     Comment:   External Document

## 2010-05-07 NOTE — Letter (Signed)
Summary: Lipid Letter  [facility]  [address] [place], [place] [address]   Phone: [phone]  Fax: [phone]    09/03/2008  Aliese Brannum [address] [place], [place]  [address]  Dear Dois Davenport:  We have carefully reviewed your last lipid profile from 09/03/2008 and the results are noted below with a summary of recommendations for lipid management.    Cholesterol:       115     Goal: <   HDL "good" Cholesterol:   57.84     Goal: >   LDL "bad" Cholesterol:   42     Goal: <   Triglycerides:       97.0     Goal: <  Cholestrol looks good, and  no liver prbloems. Therefore  continue same statin dosing. No evidence of protein in your urine or kidney dysfuction to explain swelling. Continue salt restriction.   Diabetes uncontrolled as expected. Please continue seeing Dr. Everardo All concerning diabetes. Normal TSH continue same thyroid treatment. Attached a copy of your bloodwork for your record.   LIFESTYLE RECOMMENDATIONS   TLC Diet (Therapeutic Lifestyle Change): Saturated Fats & Transfatty acids should be kept < 7% of total calories ***Reduce Saturated Fats Polyunstaurated Fat can be up to 10% of total calories Monounsaturated Fat Fat can be up to 20% of total calories Total Fat should be no greater than 25-35% of total calories Carbohydrates should be 50-60% of total calories Protein should be approximately 15% of total calories Fiber should be at least 20-30 grams a day ***Increased fiber may help lower LDL Total Cholesterol should be < 200mg /day Consider adding plant stanol/sterols to diet (example: Benacol spread) ***A higher intake of unsaturated fat may reduce Triglycerides and Increase HDL    Adjunctive Measures (may lower LIPIDS and reduce risk of Heart Attack) include: Aerobic Exercise (20-30 minutes 3-4 times a week) Limit Alcohol Consumption Weight Reduction Aspirin 75-81 mg a day by mouth (if not allergic or contraindicated) Dietary Fiber  20-30 grams a day by mouth     Current Medications: 1)    Metformin Hcl 1000 Mg Tabs (Metformin hcl) .Marland Kitchen.. 1 by mouth qam, 1 by mouth qpm 2)    Lipitor 20 Mg Tabs (Atorvastatin calcium) .Marland Kitchen.. 1 by mouth once daily 3)    Premarin 0.3 Mg Tabs (Estrogens conjugated) .Marland Kitchen.. 1 by mouth qd 4)    Synthroid 50 Mcg Tabs (Levothyroxine sodium) .Marland Kitchen.. 1 by mouth qd 5)    Urocit-k 10 1080 Mg Cr-tabs (Potassium citrate) .... 2 by mouth qam, 2 by mouth qpm 6)    Hydrochlorothiazide 25 Mg Tabs (Hydrochlorothiazide) .Marland Kitchen.. 1 by mouth qam 7)    Effexor Xr 150 Mg Xr24h-cap (Venlafaxine hcl) .Marland Kitchen.. 1 by mouth qam 8)    Abilify 20 Mg Tabs (Aripiprazole) .Marland Kitchen.. 1 by mouth at bedtime 9)    Lamotrigine 200 Mg Tabs (Lamotrigine) .Marland Kitchen.. 1 by mouth q noon 10)    Clonazepam 1 Mg Tabs (Clonazepam) .Marland Kitchen.. 1 by mouth at bedtime 11)    Seroquel Xr 150 Mg Xr24h-tab (Quetiapine fumarate) .Marland Kitchen.. 1 in am, 3 at bedtime 12)    Fexofenadine Hcl 180 Mg Tabs (Fexofenadine hcl) .Marland Kitchen.. 1 by mouth q am 13)    Mirtazapine 30 Mg Tabs (Mirtazapine) .Marland Kitchen.. 1 by mouth qhs 14)    Multivitamins  Caps (Multiple vitamin) .Marland Kitchen.. 1 by mouth once daily 15)    Vitamin D-3  1000 Iu  .Marland Kitchen.. 1 by mouth at bedtime 16)  Humalog 100 Unit/ml Soln (Insulin lispro (human)) .... 4 times a day (qac) 03-11-11-4 units 17)    Topiramate 100 Mg Tabs (Topiramate) .Marland Kitchen.. 1 tab bid 18)    Novolin N 100 Unit/ml Susp (Insulin isophane human) .... 4 units qhs 19)    Meloxicam 15 Mg Tabs (Meloxicam) .... Take 1 q am  If you have any questions, please call. We appreciate being able to work with you.   Sincerely,     [facility] Newt Lukes MD

## 2010-05-07 NOTE — Progress Notes (Signed)
Summary: HCTZ  Phone Note Refill Request Message from:  Fax from Pharmacy on December 16, 2008 4:44 PM  Refills Requested: Medication #1:  HYDROCHLOROTHIAZIDE 25 MG TABS 1 by mouth QAM [facility] [phone]   Method Requested: Fax to Mail Away Pharmacy Initial call taken by: Orlan Leavens,  December 16, 2008 4:45 PM    Prescriptions: HYDROCHLOROTHIAZIDE 25 MG TABS (HYDROCHLOROTHIAZIDE) 1 by mouth QAM  #90 x 3   Entered by:   Orlan Leavens   Authorized by:   Newt Lukes MD   Signed by:   Orlan Leavens on 12/16/2008   Method used:   [facility]ed to ...       [facility]       [address]       [place], [place]  [address]       Ph: [phone]       Fax: [phone]   RxID:   [id number]

## 2010-05-07 NOTE — Progress Notes (Signed)
  Phone Note Refill Request Message from:  Fax from Pharmacy on Aug 14, 2009 9:30 AM  Refills Requested: Medication #1:  FUROSEMIDE 40 MG TABS 1 by mouth qd   Dosage confirmed as above?Dosage Confirmed Initial call taken by: Josph Macho RMA,  Aug 14, 2009 9:30 AM    Prescriptions: FUROSEMIDE 40 MG TABS (FUROSEMIDE) 1 by mouth qd  #90 x 2   Entered by:   Josph Macho RMA   Authorized by:   Minus Breeding MD   Signed by:   Josph Macho RMA on 08/14/2009   Method used:   Print then Give to Patient   RxID:   [id number]   Appended Document:  Faxed

## 2010-05-07 NOTE — Miscellaneous (Signed)
Summary: LT Side Thyroid Biopsy/[facility]  LT Side Thyroid Biopsy/[facility]   Imported By: Sherian Rein 01/16/2009 11:52:22  _____________________________________________________________________  External Attachment:    Type:   Image     Comment:   External Document

## 2010-05-07 NOTE — Assessment & Plan Note (Signed)
Summary: 3 MTH FU  PER PT D/T--$50--STC   Vital Signs:  Patient profile:   60 year old female Height:      63 inches Weight:      243 pounds O2 Sat:      97 % Temp:     97.5 degrees F oral Pulse rate:   85 / minute BP sitting:   122 / 82  (left arm) Cuff size:   large  Vitals Entered By: Orlan Leavens (December 24, 2008 8:37 AM) CC: 3 month follow-up Is Patient Diabetic? Yes  Pain Assessment Patient in pain? no        Primary Care Provider:  Newt Lukes MD  CC:  3 month follow-up.  History of Present Illness: R foot pain - has seen dr. Lajoyce Corners for same -  treatment begun for ?gout - swelling and swelling and pain not improving toes feel numb going back today to repeat blood work - prev high CRP but no inc WBC or fever reported  goiter - neck and L front has been sore endo planning repeat US a/w tension headache x 5 days  cysts on kidney - dr. Statistician at [facility] = uro following for same -   hypokalemia - taking tabs and packs of KCl supp - (?both) no muscle cramping diffusely  DM - insulin adjusted this AM by endo - dr. Everardo All  no hypoglcemic events noted  Clinical Review Panels:  Lipid Management   Cholesterol:  115 (09/03/2008)   LDL (bad choesterol):  42 (09/03/2008)   HDL (good cholesterol):  53.50 (09/03/2008)  Diabetes Management   HgBA1C:  8.4 (09/03/2008)   Creatinine:  0.8 (11/26/2008)   Last Dilated Eye Exam:  normal (07/26/2008)   Last Flu Vaccine:  Historical (01/08/2008)  Complete Metabolic Panel   Glucose:  216 (11/26/2008)   Sodium:  140 (11/26/2008)   Potassium:  3.2 (11/26/2008)   Chloride:  100 (11/26/2008)   CO2:  32 (11/26/2008)   BUN:  10 (11/26/2008)   Creatinine:  0.8 (11/26/2008)   Albumin:  3.5 (09/25/2008)   Total Protein:  6.8 (09/25/2008)   Calcium:  9.5 (11/26/2008)   Total Bili:  0.4 (09/25/2008)   Alk Phos:  128 (09/25/2008)   SGPT (ALT):  38 (09/25/2008)   SGOT (AST):  46 (09/25/2008)   Current Medications  (verified): 1)  Metformin Hcl 1000 Mg Tabs (Metformin Hcl) .Marland Kitchen.. 1 By Mouth Qam, 1 By Mouth Qpm 2)  Lipitor 20 Mg Tabs (Atorvastatin Calcium) .Marland Kitchen.. 1 By Mouth Once Daily 3)  Premarin 0.3 Mg Tabs (Estrogens Conjugated) .Marland Kitchen.. 1 By Mouth Qd 4)  Synthroid 50 Mcg Tabs (Levothyroxine Sodium) .Marland Kitchen.. 1 By Mouth Qd 5)  Urocit-K 10 1080 Mg Cr-Tabs (Potassium Citrate) .... 2 By Mouth Qam, 2 By Mouth Qpm 6)  Hydrochlorothiazide 25 Mg Tabs (Hydrochlorothiazide) .Marland Kitchen.. 1 By Mouth Qam 7)  Effexor Xr 150 Mg Xr24h-Cap (Venlafaxine Hcl) .Marland Kitchen.. 1 By Mouth Qam 8)  Abilify 20 Mg Tabs (Aripiprazole) .Marland Kitchen.. 1 By Mouth At Bedtime 9)  Lamotrigine 200 Mg Tabs (Lamotrigine) .Marland Kitchen.. 1 By Mouth Q Noon 10)  Clonazepam 1 Mg Tabs (Clonazepam) .Marland Kitchen.. 1 By Mouth At Bedtime 11)  Seroquel Xr 150 Mg Xr24h-Tab (Quetiapine Fumarate) .Marland Kitchen.. 1 in Am, 3 At Bedtime 12)  Fexofenadine Hcl 180 Mg Tabs (Fexofenadine Hcl) .Marland Kitchen.. 1 By Mouth Q Am 13)  Mirtazapine 30 Mg Tabs (Mirtazapine) .Marland Kitchen.. 1 By Mouth Qhs 14)  Multivitamins  Caps (Multiple Vitamin) .Marland Kitchen.. 1 By Mouth Once Daily  15)  Vitamin D-3  1000 Iu .Marland Kitchen.. 1 By Mouth At Bedtime 16)  Humalog 100 Unit/ml Soln (Insulin Lispro (Human)) .... 4 Times A Day (Qac) 03-11-11- Units 17)  Topiramate 100 Mg Tabs (Topiramate) .Marland Kitchen.. 1 Tab Bid 18)  Novolin N 100 Unit/ml Susp (Insulin Isophane Human) .... 20 Units Qhs 19)  Furosemide 40 Mg Tabs (Furosemide) .Marland Kitchen.. 1 By Mouth Qd 20)  Onetouch Ultra Test  Strp (Glucose Blood) .Marland Kitchen.. 1 Strip 4 Times A Day 21)  Aspirin 81 Mg Tabs (Aspirin) .... Take 1 By Mouth Qd 22)  Potassium Chloride 20 Meq Pack (Potassium Chloride) .Marland Kitchen.. 1 By Mouth Once Daily 23)  Allopurinol 100 Mg Tabs (Allopurinol) .... 2 A Day  Allergies (verified): No Known Drug Allergies  Past History:  Past Medical History: IDDM (ICD-250.01) ALLERGIC RHINITIS (ICD-477.9) ASYMPTOMATIC POSTMENOPAUSAL STATUS (ICD-V49.81) HYPERCHOLESTEROLEMIA (ICD-272.0) HYPERTENSION (ICD-401.9) BIPOLAR DISORDER UNSPECIFIED  (ICD-296.80) HYPOTHYROIDISM (ICD-244.9) GOITER  physician roster: Dr. Bryson Ha at [facility]. Dr. Lucienne Minks at [facility] orthopedics Dr. Edison Pace Dr. Artist [place] Dr. Lavonia Drafts Dr. Everardo All - endo  Review of Systems  The patient denies fever, weight loss, chest pain, and abdominal pain.    Physical Exam  General:  alert, well-developed, well-nourished, and cooperative to examination.    Lungs:  normal respiratory effort, no intercostal retractions or use of accessory muscles; normal breath sounds bilaterally - no crackles and no wheezes.    Heart:  normal rate, regular rhythm, no murmur, and no rub. BLE without edema. (see Mskel exam)  Msk:  improved but diffuse edema throughout R foot - nontender to palpation dorsal and plantar surfaces. FROM in toes and ankle w/o pain or limitations. no knee effusions B - L foot and ankle normal appearing   Impression & Recommendations:  Problem # 1:  EFFUSION OF ANKLE AND FOOT JOINT (ICD-719.07) following with dr. 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 1 by mouth qam, 1 by mouth qpm    Humalog 100 Unit/ml Soln (Insulin lispro (human)) .Marland KitchenMarland KitchenMarland KitchenMarland Kitchen 4 times a day (qac) 03-11-11- units    Novolin N 100 Unit/ml Susp (Insulin isophane human) .Marland Kitchen... 20 units qhs    Aspirin 81 Mg Tabs (Aspirin) .Marland Kitchen... Take 1 by mouth qd  Problem # 4:  HYPERTENSION (ICD-401.9) well controlled on diuretic only tx - ?need ARB Her updated medication list for this problem includes:    Hydrochlorothiazide 25 Mg Tabs (Hydrochlorothiazide) .Marland Kitchen... 1 by mouth  qam    Furosemide 40 Mg Tabs (Furosemide) .Marland Kitchen... 1 by mouth qd  BP today: 122/82 Prior BP: 122/82 (12/24/2008)  Labs Reviewed: K+: 3.2 (11/26/2008) Creat: : 0.8 (11/26/2008)   Chol: 115 (09/03/2008)   HDL: 53.50 (09/03/2008)   LDL: 42 (09/03/2008)   TG: 97.0 (09/03/2008)  Orders: TLB-BMP (Basic Metabolic Panel-BMET) (80048-METABOL)  Problem # 5:  BIPOLAR DISORDER UNSPECIFIED (ICD-296.80) still following with dr. Jamas Lav no recent med changes  Problem # 6:  CHRONIC DIASTOLIC HEART FAILURE (ICD-428.32) mild on recent echo - results 8/2 reviewed cont diuretic tx - no SOB or increase edema  Her updated medication list for this problem includes:  Hydrochlorothiazide 25 Mg Tabs (Hydrochlorothiazide) .Marland Kitchen... 1 by mouth qam    Furosemide 40 Mg Tabs (Furosemide) .Marland Kitchen... 1 by mouth qd    Aspirin 81 Mg Tabs (Aspirin) .Marland Kitchen... Take 1 by mouth qd  Complete Medication List: 1)  Metformin Hcl 1000 Mg Tabs (Metformin hcl) .Marland Kitchen.. 1 by mouth qam, 1 by mouth qpm 2)  Lipitor 20 Mg Tabs (Atorvastatin calcium) .Marland Kitchen.. 1 by mouth once daily 3)  Premarin 0.3 Mg Tabs (Estrogens conjugated) .Marland Kitchen.. 1 by mouth qd 4)  Synthroid 50 Mcg Tabs (Levothyroxine sodium) .Marland Kitchen.. 1 by mouth qd 5)  Urocit-k 10 1080 Mg Cr-tabs (Potassium citrate) .... 2 by mouth qam, 2 by mouth qpm 6)  Hydrochlorothiazide 25 Mg Tabs (Hydrochlorothiazide) .Marland Kitchen.. 1 by mouth qam 7)  Effexor Xr 150 Mg Xr24h-cap (Venlafaxine hcl) .Marland Kitchen.. 1 by mouth qam 8)  Abilify 20 Mg Tabs (Aripiprazole) .Marland Kitchen.. 1 by mouth at bedtime 9)  Lamotrigine 200 Mg Tabs (Lamotrigine) .Marland Kitchen.. 1 by mouth q noon 10)  Clonazepam 1 Mg Tabs (Clonazepam) .Marland Kitchen.. 1 by mouth at bedtime 11)  Seroquel Xr 150 Mg Xr24h-tab (Quetiapine fumarate) .Marland Kitchen.. 1 in am, 3 at bedtime 12)  Fexofenadine Hcl 180 Mg Tabs (Fexofenadine hcl) .Marland Kitchen.. 1 by mouth q am 13)  Mirtazapine 30 Mg Tabs (Mirtazapine) .Marland Kitchen.. 1 by mouth qhs 14)  Multivitamins Caps (Multiple vitamin) .Marland Kitchen.. 1 by mouth once daily 15)  Vitamin D-3 1000  Iu  .Marland Kitchen.. 1 by mouth at bedtime 16)  Humalog 100 Unit/ml Soln (Insulin lispro (human)) .... 4 times a day (qac) 03-11-11- units 17)  Topiramate 100 Mg Tabs (Topiramate) .Marland Kitchen.. 1 tab bid 18)  Novolin N 100 Unit/ml Susp (Insulin isophane human) .... 20 units qhs 19)  Furosemide 40 Mg Tabs (Furosemide) .Marland Kitchen.. 1 by mouth qd 20)  Onetouch Ultra Test Strp (Glucose blood) .Marland Kitchen.. 1 strip 4 times a day 21)  Aspirin 81 Mg Tabs (Aspirin) .... Take 1 by mouth qd 22)  Potassium Chloride 20 Meq Pack (Potassium chloride) .Marland Kitchen.. 1 by mouth once daily 23)  Allopurinol 100 Mg Tabs (Allopurinol) .... 2 a day  Patient Instructions: 1)  Please schedule a follow-up appointment in 3 months, sooner if problems. 2)  labwork today - your results will be posted on the phone tree for review in next 24-48 hours.  3)  please ask dr. Lajoyce Corners to send copies of his notes to our office - thanks   Immunization History:  Tetanus/Td Immunization History:    Tetanus/Td:  historical (04/05/2004)

## 2010-05-07 NOTE — Assessment & Plan Note (Signed)
Summary: PER DEBRA SCHED-FEET SWELLING X 3 WKS-$50-URG/ER IF WORSEN-STC   Vital Signs:  Patient profile:   60 year old female Height:      63 inches Weight:      250 pounds BMI:     44.45 Temp:     98.3 degrees F oral Pulse rate:   100 / minute BP sitting:   132 / 84 Cuff size:   large  Vitals Entered By: Ami Bullins CMA (September 25, 2008 10:52 AM) CC: f/u dm   Referring Provider:  Clancy Gourd Primary Provider:  Newt Lukes MD  CC:  f/u dm.  History of Present Illness: she feels well in general.  she brings a record of her cbg's which i have reviewed today.  it is highest in am 9200), followed by hs (high-100's).  she says it is consistently higher in am than at hs, even if she does not eat a snack at hs.  Current Medications (verified): 1)  Metformin Hcl 1000 Mg Tabs (Metformin Hcl) .Marland Kitchen.. 1 By Mouth Qam, 1 By Mouth Qpm 2)  Lipitor 20 Mg Tabs (Atorvastatin Calcium) .Marland Kitchen.. 1 By Mouth Once Daily 3)  Premarin 0.3 Mg Tabs (Estrogens Conjugated) .Marland Kitchen.. 1 By Mouth Qd 4)  Synthroid 50 Mcg Tabs (Levothyroxine Sodium) .Marland Kitchen.. 1 By Mouth Qd 5)  Urocit-K 10 1080 Mg Cr-Tabs (Potassium Citrate) .... 2 By Mouth Qam, 2 By Mouth Qpm 6)  Hydrochlorothiazide 25 Mg Tabs (Hydrochlorothiazide) .Marland Kitchen.. 1 By Mouth Qam 7)  Effexor Xr 150 Mg Xr24h-Cap (Venlafaxine Hcl) .Marland Kitchen.. 1 By Mouth Qam 8)  Abilify 20 Mg Tabs (Aripiprazole) .Marland Kitchen.. 1 By Mouth At Bedtime 9)  Lamotrigine 200 Mg Tabs (Lamotrigine) .Marland Kitchen.. 1 By Mouth Q Noon 10)  Clonazepam 1 Mg Tabs (Clonazepam) .Marland Kitchen.. 1 By Mouth At Bedtime 11)  Seroquel Xr 150 Mg Xr24h-Tab (Quetiapine Fumarate) .Marland Kitchen.. 1 in Am, 3 At Bedtime 12)  Fexofenadine Hcl 180 Mg Tabs (Fexofenadine Hcl) .Marland Kitchen.. 1 By Mouth Q Am 13)  Mirtazapine 30 Mg Tabs (Mirtazapine) .Marland Kitchen.. 1 By Mouth Qhs 14)  Multivitamins  Caps (Multiple Vitamin) .Marland Kitchen.. 1 By Mouth Once Daily 15)  Vitamin D-3  1000 Iu .Marland Kitchen.. 1 By Mouth At Bedtime 16)  Humalog 100 Unit/ml Soln (Insulin Lispro (Human)) .... 4 Times A Day (Qac)  03-11-11- Units 17)  Topiramate 100 Mg Tabs (Topiramate) .Marland Kitchen.. 1 Tab Bid 18)  Novolin N 100 Unit/ml Susp (Insulin Isophane Human) .Marland Kitchen.. 10 Units Qhs 19)  Furosemide 40 Mg Tabs (Furosemide) .Marland Kitchen.. 1 By Mouth Qd 20)  Onetouch Ultra Test  Strp (Glucose Blood) .Marland Kitchen.. 1 Strip 4 Times A Day 21)  Aspirin 81 Mg Tabs (Aspirin) .... Take 1 By Mouth Qd  Allergies (verified): No Known Drug Allergies  Past History:  Past Medical History: Last updated: 09/03/2008 IDDM (ICD-250.01) ALLERGIC RHINITIS (ICD-477.9) ASYMPTOMATIC POSTMENOPAUSAL STATUS (ICD-V49.81) HYPERCHOLESTEROLEMIA (ICD-272.0) HYPERTENSION (ICD-401.9) BIPOLAR DISORDER UNSPECIFIED (ICD-296.80) HYPOTHYROIDISM (ICD-244.9)  physician roster: Dr. Bryson Ha at [facility]. Dr. Turner Daniels at [facility] orthopedics Dr. Edison Pace Dr. Artist Beach Dr. Lavonia Drafts  Review of Systems  The patient denies hypoglycemia.    Physical Exam  General:  obese.  no distress  Psych:  Alert and cooperative; normal mood and affect; normal attention span and concentration.     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 15 units qhs  Other Orders: Est. Patient Level III ([address])  Patient  Instructions: 1)  continue humalog three times a day (just before each meal) 03-13-11 units 2)  increase nph to 15 units at bedtime.  if this does not improve your morning sugar to low-100, increase to 20 units at bedtime. 3)  return 6-8 weeks.

## 2010-05-07 NOTE — Progress Notes (Signed)
  Phone Note Refill Request Call back at [facility] Phone [phone]   Refills Requested: Medication #1:  ONETOUCH ULTRA TEST  STRP 1 strip 4 times a day. Caller: [phone] Call For: Newt Lukes MD Summary of Call: per Lanna Poche call states she has run out of her testing  strips . one touch ultra  strips need 1 box pt test 4 times a day [facility] [place] [phone].Marland Kitchenpls call in per pt , she is out of town  Initial call taken by: Shelbie Proctor,  September 18, 2008 8:51 AM    New/Updated Medications: ONETOUCH ULTRA TEST  STRP (GLUCOSE BLOOD) 1 strip 4 times a day   Prescriptions: ONETOUCH ULTRA TEST  STRP (GLUCOSE BLOOD) 1 strip 4 times a day  #120 x 0   Entered by:   Ami Bullins CMA   Authorized by:   Minus Breeding MD   Signed by:   Bill Salinas CMA on 09/18/2008   Method used:   [phone]       [address]       [place], [place]  [address]       Ph: [phone] or [phone]       Fax: [phone]   RxID:   [id number]

## 2010-05-07 NOTE — Progress Notes (Signed)
Summary: ORDER IN EMR  Phone Note From Other Clinic   Summary of Call: SEE append, pt needs order in EMR for Dr Lajoyce Corners.  Initial call taken by: Lamar Sprinkles,  November 01, 2008 2:00 PM  Follow-up for Phone Call        done - thanks

## 2010-05-07 NOTE — Assessment & Plan Note (Signed)
Summary: PER PH NOTE/FLU SHOT /SAE--SCHED BX--#--PHONE  STC   Vital Signs:  Patient profile:   60 year old female Height:      63 inches (160.02 cm) Weight:      245.38 pounds (111.54 kg) O2 Sat:      95 % on Room air Temp:     97.1 degrees F (36.17 degrees C) oral Pulse rate:   82 / minute BP sitting:   122 / 74  (left arm) Cuff size:   large  Vitals Entered By: Josph Macho CMA (January 13, 2009 1:10 PM)  O2 Flow:  Room air CC: follow-up visit/ pt wants flu vax today/ CF   Referring Sally-Anne Wamble:  Clancy Gourd Primary Alix Stowers:  Newt Lukes MD  CC:  follow-up visit/ pt wants flu vax today/ CF.  History of Present Illness: no cbg record, but states cbg's are improved.  it is lowest in the afternoon (70's), and highest (200) at hs, and in am.   Current Medications (verified): 1)  Metformin Hcl 1000 Mg Tabs (Metformin Hcl) .Marland Kitchen.. 1 By Mouth Qam, 1 By Mouth Qpm 2)  Lipitor 20 Mg Tabs (Atorvastatin Calcium) .Marland Kitchen.. 1 By Mouth Once Daily 3)  Premarin 0.3 Mg Tabs (Estrogens Conjugated) .Marland Kitchen.. 1 By Mouth Qd 4)  Synthroid 50 Mcg Tabs (Levothyroxine Sodium) .Marland Kitchen.. 1 By Mouth Qd 5)  Urocit-K 10 1080 Mg Cr-Tabs (Potassium Citrate) .... 2 By Mouth Qam, 2 By Mouth Qpm 6)  Hydrochlorothiazide 25 Mg Tabs (Hydrochlorothiazide) .Marland Kitchen.. 1 By Mouth Qam 7)  Effexor Xr 150 Mg Xr24h-Cap (Venlafaxine Hcl) .Marland Kitchen.. 1 By Mouth Qam 8)  Abilify 20 Mg Tabs (Aripiprazole) .Marland Kitchen.. 1 By Mouth At Bedtime 9)  Lamotrigine 200 Mg Tabs (Lamotrigine) .Marland Kitchen.. 1 By Mouth Q Noon 10)  Clonazepam 1 Mg Tabs (Clonazepam) .Marland Kitchen.. 1 By Mouth At Bedtime 11)  Seroquel Xr 150 Mg Xr24h-Tab (Quetiapine Fumarate) .Marland Kitchen.. 1 in Am, 3 At Bedtime 12)  Fexofenadine Hcl 180 Mg Tabs (Fexofenadine Hcl) .Marland Kitchen.. 1 By Mouth Q Am 13)  Mirtazapine 30 Mg Tabs (Mirtazapine) .Marland Kitchen.. 1 By Mouth Qhs 14)  Multivitamins  Caps (Multiple Vitamin) .Marland Kitchen.. 1 By Mouth Once Daily 15)  Vitamin D-3  1000 Iu .Marland Kitchen.. 1 By Mouth At Bedtime 16)  Humalog 100 Unit/ml Soln (Insulin  Lispro (Human)) .... 4 Times A Day (Qac) 03-11-11- Units 17)  Topiramate 100 Mg Tabs (Topiramate) .Marland Kitchen.. 1 Tab Bid 18)  Novolin N 100 Unit/ml Susp (Insulin Isophane Human) .... 20 Units Qhs 19)  Furosemide 40 Mg Tabs (Furosemide) .Marland Kitchen.. 1 By Mouth Qd 20)  Onetouch Ultra Test  Strp (Glucose Blood) .Marland Kitchen.. 1 Strip 4 Times A Day 21)  Aspirin 81 Mg Tabs (Aspirin) .... Take 1 By Mouth Qd 22)  Potassium Chloride 20 Meq Pack (Potassium Chloride) .Marland Kitchen.. 1 By Mouth Once Daily 23)  Allopurinol 100 Mg Tabs (Allopurinol) .... 2 A Day 24)  Oxybutynin Chloride 10 Mg Xr24h-Tab (Oxybutynin Chloride) .Marland Kitchen.. 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 Bryson Ha at [facility]. Dr. Lucienne Minks at [facility] orthopedics Dr. Edison Pace Dr. Artist Beach Dr. Lavonia Drafts Dr. Everardo All - endo  Review of Systems  The patient denies hypoglycemia.    Physical Exam  General:  obese.   Neck:  thyroid is probably 3x normal size, left > right.  i cannot  appreciate a  nodule. 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 3 times a day (qac) 03-08-13- units 2)  Oxybutynin Chloride 10 Mg Xr24h-tab (Oxybutynin chloride) .Marland Kitchen.. 1 at pm 3)  Cefuroxime Axetil 250 Mg Tabs (Cefuroxime axetil) .Marland Kitchen.. 1 qd  Other  Orders: Flu Vaccine 80yrs + (04540) Administration Flu vaccine - MCR (G0008) Est. Patient Level III (98119) Thyroid Biopsy Percutaneous Core Needle (60100)  Patient Instructions: 1)  change humalog to three times a day (just before each meal) 03-08-13 units 2)  continue nph 20 units at bedtime. 3)  return 2 months. 4)  thyroid biopsy is done today.  in a few days, please call [phone] to hear your test results. Flu Vaccine Consent Questions     Do you have a history of severe allergic reactions to this vaccine? no    Any prior history of allergic reactions to egg and/or gelatin? no    Do you have a sensitivity to the preservative Thimersol? no    Do you have a past history of Guillan-Barre Syndrome? no    Do you currently have an acute febrile illness? no    Have you ever had a severe reaction to latex? no    Vaccine information given and explained to patient? yes    Are you currently pregnant? no    Lot Number:[id number]   Exp Date:10/02/2009   Site Given  Left Deltoid IM 3)  return 3 months 4)  tests are being ordered for you today.  a few days after the test(s), please call [phone] to hear your test results. Prescriptions: CEFUROXIME AXETIL 250 MG TABS (CEFUROXIME AXETIL) 1 qd  #14 x 0   Entered and Authorized by:   Minus Breeding MD   Signed by:   Minus Breeding MD on 01/17/2009   Method used:   Electronically to        [facility] [phone][facility] (retail)       [address] [place], [place]  [address]       Ph: [phone] or [phone]       Fax: [phone]   RxID:   [id number] [place] TEST  STRP (GLUCOSE BLOOD) 1 strip 4 times a day  #360 x 3   Entered and Authorized by:   Minus Breeding MD   Signed by:   Minus Breeding MD on 01/17/2009   Method used:   Electronically to        [facility]* (mail-order)       [address] [place], [place]  [address]       Ph: [phone]       Fax: [phone]   RxID:   [id number] METFORMIN HCL  1000 MG TABS (METFORMIN HCL) 1 by mouth QAM, 1 by mouth QPM  #180 x 3   Entered and Authorized by:   Minus Breeding MD   Signed by:   Minus Breeding MD on 01/17/2009   Method used:   Electronically to  [facility]* (mail-order)       [address] [place], [place]  [address]       Ph: [phone]       Fax: [phone]   RxID:   [id number] [place] TEST  STRP (GLUCOSE BLOOD) 1 strip 4 times a day  #360 x 3   Entered and Authorized by:   Minus Breeding MD   Signed by:   Minus Breeding MD on 01/17/2009   Method used:   Print then Give to Patient   RxID:   [id number] METFORMIN HCL 1000 MG TABS (METFORMIN HCL) 1 by mouth QAM, 1 by mouth QPM  #180 x 3   Entered and Authorized by:   Minus Breeding MD   Signed by:   Minus Breeding MD on 01/17/2009   Method used:   Print then Give to Patient   RxID:   [id number] CEFUROXIME AXETIL 250 MG TABS (CEFUROXIME AXETIL) 1 qd  #14 x 0   Entered and Authorized by:   Minus Breeding MD   Signed by:   Minus Breeding MD on 01/17/2009   Method used:   Electronically to        [facility]retail[facility]       [address]       [place], [place]  [address]       Ph: [phone]       Fax: [phone]   RxID:   [id number] METFORMIN HCL 1000 MG TABS (METFORMIN HCL) 1 by mouth QAM, 1 by mouth QPM  #180 x 3   Entered and Authorized by:   Minus Breeding MD   Signed by:   Minus Breeding MD on 01/13/2009   Method used:   Electronically to        [facility] (retail)       [address]       [place], [place]  [address]       Ph: [phone]       Fax: [phone]   RxID:   [id number] ONETOUCH ULTRA TEST  STRP (GLUCOSE BLOOD) 1 strip 4 times a day  #360 x 3   Entered and Authorized by:   Minus Breeding MD   Signed by:   Minus Breeding MD on 01/13/2009   Method used:   Electronically to        [facility] (retail)       [address]        [place], [place]  [address]       Ph: [phone]       Fax: [phone]   RxID:   [id number]  .[email or web address]

## 2010-05-07 NOTE — Progress Notes (Signed)
  Phone Note Call from Patient Call back at Home Phone [phone]   Caller: Patient Call For: Newt Lukes MD Summary of Call: per Lanna Poche call states her feet are still swollen , appt is wed  june 23-2010  with dr Lorane Gell , does she need to be seen sooner  Initial call taken by: Shelbie Proctor,  September 23, 2008 9:58 AM  Follow-up for Phone Call        i discussed with dr Felicity Coyer.  please have pt see her in addition to me on 6/23 Follow-up by: Minus Breeding MD,  September 23, 2008 1:01 PM  Additional Follow-up for Phone Call Additional follow up Details #1::        Phone Call Completed, Provider Notified called pt to inform sgharon to scheduled with dr Felicity Coyer Additional Follow-up by: Shelbie Proctor,  September 23, 2008 3:31 PM

## 2010-05-07 NOTE — Assessment & Plan Note (Signed)
Summary: 3-6 MTH FU--D/T--STC   Vital Signs:  Patient profile:   60 year old female Height:      63 inches (160.02 cm) Weight:      247.6 pounds (112.55 kg) O2 Sat:      96 % on Room air Temp:     98.5 degrees F (36.94 degrees C) oral Pulse rate:   84 / minute BP sitting:   130 / 76  (left arm) Cuff size:   large  Vitals Entered By: Orlan Leavens RMA (March 23, 2010 1:05 PM)  O2 Flow:  Room air CC: 3-6 month follow-up Is Patient Diabetic? Yes Did you bring your meter with you today? No Pain Assessment Patient in pain? no        Primary Care Provider:  Newt Lukes MD  CC:  3-6 month follow-up.  History of Present Illness: here for f/u- 1) allg rhinitis - exac due to no coverage for antihistamine pills - not using nasal steroid regularly -   2) DM2 - reports compliance with ongoing medical treatment and no changes in medication dose or frequency. denies adverse side effects related to current therapy.  home cbg log reviewed - no hypoglycemia events  3) edema - has completely resolved -  still using furosemide with potassium meds aw occ urinary incontinence - so takes at night and wears depends to avid daytime accidents  4) HTN - reports compliance with ongoing medical treatment and no changes in medication dose or frequency. denies adverse side effects related to current therapy.   5) dyslipidemia- reports compliance with ongoing medical treatment and no changes in medication dose or frequency. denies adverse side effects related to current therapy.   6) hypothyroid - reports compliance with ongoing medical treatment and no changes in medication dose or frequency. denies adverse side effects related to current therapy.   Clinical Review Panels:  Immunizations   Last Tetanus Booster:  Historical (04/05/2004)   Last Flu Vaccine:  Fluvax 3+ (01/13/2009)  Lipid Management   Cholesterol:  115 (09/03/2008)   LDL (bad choesterol):  42 (09/03/2008)   HDL (good  cholesterol):  11.91 (09/03/2008)  Diabetes Management   HgBA1C:  7.8 (10/13/2009)   Creatinine:  0.8 (12/24/2008)   Last Dilated Eye Exam:  normal (07/26/2008)   Last Flu Vaccine:  Fluvax 3+ (01/13/2009)  CBC   WBC:  9.7 (10/29/2008)   RBC:  4.78 (10/29/2008)   Hgb:  13.1 (10/29/2008)   Hct:  39.6 (10/29/2008)   Platelets:  385.0 (10/29/2008)   MCV  82.9 (10/29/2008)   MCHC  33.0 (10/29/2008)   RDW  14.0 (10/29/2008)   PMN:  54.8 (10/29/2008)   Lymphs:  33.5 (10/29/2008)   Monos:  6.7 (10/29/2008)   Eosinophils:  4.1 (10/29/2008)   Basophil:  0.9 (10/29/2008)  Complete Metabolic Panel   Glucose:  190 (12/24/2008)   Sodium:  140 (12/24/2008)   Potassium:  3.7 (12/24/2008)   Chloride:  100 (12/24/2008)   CO2:  32 (12/24/2008)   BUN:  9 (12/24/2008)   Creatinine:  0.8 (12/24/2008)   Albumin:  3.5 (09/25/2008)   Total Protein:  6.8 (09/25/2008)   Calcium:  9.5 (12/24/2008)   Total Bili:  0.4 (09/25/2008)   Alk Phos:  128 (09/25/2008)   SGPT (ALT):  38 (09/25/2008)   SGOT (AST):  46 (09/25/2008)   Current Medications (verified): 1)  Metformin Hcl 1000 Mg Tabs (Metformin Hcl) .Marland Kitchen.. 1 By Mouth Qam, 1 By Mouth Qpm 2)  Lipitor  20 Mg Tabs (Atorvastatin Calcium) .Marland Kitchen.. 1 By Mouth Once Daily 3)  Premarin 0.3 Mg Tabs (Estrogens Conjugated) .Marland Kitchen.. 1 By Mouth Qd 4)  Synthroid 50 Mcg Tabs (Levothyroxine Sodium) .Marland Kitchen.. 1 By Mouth Qd 5)  Urocit-K 10 1080 Mg Cr-Tabs (Potassium Citrate) .... 2 By Mouth Qam, 2 By Mouth Qpm 6)  Hydrochlorothiazide 25 Mg Tabs (Hydrochlorothiazide) .Marland Kitchen.. 1 By Mouth Qam 7)  Effexor Xr 150 Mg Xr24h-Cap (Venlafaxine Hcl) .Marland Kitchen.. 1 By Mouth Qam 8)  Abilify 20 Mg Tabs (Aripiprazole) .Marland Kitchen.. 1 By Mouth At Bedtime 9)  Lamotrigine 200 Mg Tabs (Lamotrigine) .Marland Kitchen.. 1 By Mouth Q Noon 10)  Clonazepam 1 Mg Tabs (Clonazepam) .... 2 By Mouth At Bedtime 11)  Seroquel Xr 150 Mg Xr24h-Tab (Quetiapine Fumarate) .Marland Kitchen.. 1 in Am, 3 At Bedtime 12)  Mirtazapine 30 Mg Tabs (Mirtazapine) .Marland Kitchen.. 1 By  Mouth Qhs 13)  Multivitamins  Caps (Multiple Vitamin) .Marland Kitchen.. 1 By Mouth Once Daily 14)  Vitamin D-3  1000 Iu .Marland Kitchen.. 1 By Mouth At Bedtime 15)  Humalog 100 Unit/ml Soln (Insulin Lispro (Human)) .... 3 Times A Day (Qac) 12-0-14- Units 16)  Topiramate 100 Mg Tabs (Topiramate) .Marland Kitchen.. 1 Tab Bid 17)  Novolin N 100 Unit/ml Susp (Insulin Isophane Human) .... 30 Units Qhs 18)  Onetouch Ultra Test  Strp (Glucose Blood) .Marland Kitchen.. 1 Strip 4 Times A Day 19)  Aspirin 81 Mg Tabs (Aspirin) .... Take 1 By Mouth Qd 20)  Oxybutynin Chloride 10 Mg Xr24h-Tab (Oxybutynin Chloride) .Marland Kitchen.. 1 At Pm 21)  Furosemide 40 Mg Tabs (Furosemide) .Marland Kitchen.. 1 By Mouth Qd 22)  Bd Insulin Syringe Ultrafine 31g X 5/16" 0.5 Ml Misc (Insulin Syringe-Needle U-100) .... Any Brand, 4x A Day 23)  Biotin 1000 Mcg Tabs (Biotin) .... Take 1 By Mouth Once Daily 24)  Naproxen 500 Mg Tabs (Naproxen) .... Take 2 By Mouth Once Daily  Allergies (verified): No Known Drug Allergies  Past History:  Past Medical History: DM2 - insulin dep ALLERGIC RHINITIS ASYMPTOMATIC POSTMENOPAUSAL STATUS (ICD-V49.81) HYPERCHOLESTEROLEMIA  HYPERTENSION BIPOLAR DISORDER UNSPECIFIED  HYPOTHYROIDISM (ICD-244.9) GOITER  ?fibromyalga - tx by ortho/rheum      physician roster: Dr. Bryson Ha at [facility]. Dr. Lucienne Minks at [facility] orthopedics  Dr. Edison Pace Dr. Artist [place] Dr. Lavonia Drafts Dr. Everardo All - endo Dr. Dierdre Forth - rheum Dr. Jenne Pane - ENT  Review of Systems  The patient denies fever, weight loss, chest pain, headaches, and abdominal pain.    Physical Exam  General:  overweight-appearing.  alert, well-developed, well-nourished, and cooperative to examination.  Lungs:  normal respiratory effort, no intercostal retractions or use of accessory muscles; normal breath sounds bilaterally - no crackles and no wheezes.    Heart:  normal rate, regular rhythm, no murmur, and no rub. BLE without edema.    Impression & Recommendations:  Problem # 1:   HYPERCHOLESTEROLEMIA (ICD-272.0)  Her updated medication list for this problem includes:    Lipitor 20 Mg Tabs (Atorvastatin calcium) .Marland Kitchen... 1 by mouth once daily  Labs Reviewed: SGOT: 46 (09/25/2008)   SGPT: 38 (09/25/2008)   HDL:53.50 (09/03/2008)  LDL:42 (09/03/2008)  Chol:115 (09/03/2008)  Trig:97.0 (09/03/2008)  Orders: TLB-Lipid Panel (80061-LIPID)  Problem # 2:  HYPERTENSION (ICD-401.9)  Her updated medication list for this problem includes:    Hydrochlorothiazide 25 Mg Tabs (Hydrochlorothiazide) .Marland Kitchen... 1 by mouth qam    Furosemide 40 Mg Tabs (Furosemide) .Marland Kitchen... 1 by mouth qd  BP today: 130/76 Prior BP: 130/80 (01/02/2010)  Labs Reviewed: K+: 3.7 (12/24/2008) Creat: : 0.8 (12/24/2008)   Chol:  115 (09/03/2008)   HDL: 53.50 (09/03/2008)   LDL: 42 (09/03/2008)   TG: 97.0 (09/03/2008)  Orders: TLB-BMP (Basic Metabolic Panel-BMET) (80048-METABOL)  Problem # 3:  IDDM (ICD-250.01)  Her updated medication list for this problem includes:    Metformin Hcl 1000 Mg Tabs (Metformin hcl) .Marland Kitchen... 1 by mouth qam, 1 by mouth qpm    Humalog 100 Unit/ml Soln (Insulin lispro (human)) .Marland KitchenMarland KitchenMarland KitchenMarland Kitchen 3 times a day (qac) 12-0-14- units    Novolin N 100 Unit/ml Susp (Insulin isophane human) .Marland KitchenMarland KitchenMarland KitchenMarland Kitchen 30 units qhs    Aspirin 81 Mg Tabs (Aspirin) .Marland Kitchen... Take 1 by mouth qd  mgmt per endo -  Labs Reviewed: Creat: 0.8 (12/24/2008)     Last Eye Exam: normal (07/26/2008) Reviewed HgBA1c results: 7.8 (10/13/2009)  7.8 (06/16/2009)  Problem # 4:  HYPOTHYROIDISM (ICD-244.9)  Her updated medication list for this problem includes:    Synthroid 50 Mcg Tabs (Levothyroxine sodium) .Marland Kitchen... 1 by mouth once daily  mgmt per endo - goiter hx noted  Labs Reviewed: TSH: 1.05 (09/03/2008)    HgBA1c: 7.8 (10/13/2009) Chol: 115 (09/03/2008)   HDL: 53.50 (09/03/2008)   LDL: 42 (09/03/2008)   TG: 97.0 (09/03/2008)  Complete Medication List: 1)  Metformin Hcl 1000 Mg Tabs (Metformin hcl) .Marland Kitchen.. 1 by mouth qam, 1 by mouth  qpm 2)  Lipitor 20 Mg Tabs (Atorvastatin calcium) .Marland Kitchen.. 1 by mouth once daily 3)  Premarin 0.3 Mg Tabs (Estrogens conjugated) .Marland Kitchen.. 1 by mouth qd 4)  Synthroid 50 Mcg Tabs (Levothyroxine sodium) .Marland Kitchen.. 1 by mouth qd 5)  Urocit-k 10 1080 Mg Cr-tabs (Potassium citrate) .... 2 by mouth qam, 2 by mouth qpm 6)  Hydrochlorothiazide 25 Mg Tabs (Hydrochlorothiazide) .Marland Kitchen.. 1 by mouth qam 7)  Effexor Xr 150 Mg Xr24h-cap (Venlafaxine hcl) .Marland Kitchen.. 1 by mouth qam 8)  Abilify 20 Mg Tabs (Aripiprazole) .Marland Kitchen.. 1 by mouth at bedtime 9)  Lamotrigine 200 Mg Tabs (Lamotrigine) .Marland Kitchen.. 1 by mouth q noon 10)  Clonazepam 1 Mg Tabs (Clonazepam) .... 2 by mouth at bedtime 11)  Seroquel Xr 150 Mg Xr24h-tab (Quetiapine fumarate) .Marland Kitchen.. 1 in am, 3 at bedtime 12)  Mirtazapine 30 Mg Tabs (Mirtazapine) .Marland Kitchen.. 1 by mouth qhs 13)  Multivitamins Caps (Multiple vitamin) .Marland Kitchen.. 1 by mouth once daily 14)  Vitamin D-3 1000 Iu  .Marland Kitchen.. 1 by mouth at bedtime 15)  Humalog 100 Unit/ml Soln (Insulin lispro (human)) .... 3 times a day (qac) 12-0-14- units 16)  Topiramate 100 Mg Tabs (Topiramate) .Marland Kitchen.. 1 tab bid 17)  Novolin N 100 Unit/ml Susp (Insulin isophane human) .... 30 units qhs 18)  Onetouch Ultra Test Strp (Glucose blood) .Marland Kitchen.. 1 strip 4 times a day 19)  Aspirin 81 Mg Tabs (Aspirin) .... Take 1 by mouth qd 20)  Oxybutynin Chloride 10 Mg Xr24h-tab (Oxybutynin chloride) .Marland Kitchen.. 1 at pm 21)  Furosemide 40 Mg Tabs (Furosemide) .Marland Kitchen.. 1 by mouth qd 22)  Bd Insulin Syringe Ultrafine 31g X 5/16" 0.5 Ml Misc (Insulin syringe-needle u-100) .... Any brand, 4x a day 23)  Biotin 1000 Mcg Tabs (Biotin) .... Take 1 by mouth once daily 24)  Naproxen 500 Mg Tabs (Naproxen) .... Take 2 by mouth once daily  Other Orders: TLB-Hepatic/Liver Function Pnl (80076-HEPATIC)  Patient Instructions: 1)  it was good to see you today. 2)  test(s) ordered today - your results will be posted on the phone tree for review in 48-72 hours from the time of test completion; call  [phone] and enter your 9 digit MRN (  listed above on this page, just below your name); if any changes need to be made or there are abnormal results, you will be contacted directly.  3)  medications reviewed - no changes -  4)  Please schedule a follow-up appointment in 3-6 months to monitor blood pressure and other issues, call sooner if problems.    Orders Added: 1)  Est. Patient Level IV [16109] 2)  TLB-Hepatic/Liver Function Pnl [80076-HEPATIC] 3)  TLB-Lipid Panel [80061-LIPID] 4)  TLB-BMP (Basic Metabolic Panel-BMET) [80048-METABOL]

## 2010-05-07 NOTE — Progress Notes (Signed)
Summary: premarin  Phone Note Refill Request Message from:  Fax from Pharmacy on Aug 14, 2009 8:42 AM  Refills Requested: Medication #1:  PREMARIN 0.3 MG TABS 1 by mouth QD [facility] [phone]   Method Requested: Electronic Initial call taken by: Orlan Leavens,  Aug 14, 2009 8:43 AM    Prescriptions: PREMARIN 0.3 MG TABS (ESTROGENS CONJUGATED) 1 by mouth QD  #90 x 1   Entered by:   Orlan Leavens   Authorized by:   Newt Lukes MD   Signed by:   Orlan Leavens on 08/14/2009   Method used:   Faxed to ...       [facility])       [address] [place], [place]  [address]       Ph: [phone]       Fax: [phone]   RxID:   [id number]   Appended Document: premarin & synthroid    Clinical Lists Changes  Medications: Rx of SYNTHROID 50 MCG TABS (LEVOTHYROXINE SODIUM) 1 by mouth QD;  #90 x 1;  Signed;  Entered by: Orlan Leavens;  Authorized by: Newt Lukes MD;  Method used: [facility]ed to [facility] [place], [address], [place], [place]  [address], Ph: [phone], Fax: [phone]    Prescriptions: SYNTHROID 50 MCG TABS (LEVOTHYROXINE SODIUM) 1 by mouth QD  #90 x 1   Entered by:   Orlan Leavens   Authorized by:   Newt Lukes MD   Signed by:   Orlan Leavens on 08/14/2009   Method used:   Faxed to ...       [facility] (mail[facility]order)       [address] [place], [place]  [address]       Ph: [phone]       Fax: [phone]   RxID:   [id number]

## 2010-05-07 NOTE — Assessment & Plan Note (Signed)
Summary: earache/vl/cd   Vital Signs:  Patient profile:   60 year old female Height:      63 inches (160.02 cm) Weight:      244 pounds (110.91 kg) O2 Sat:      97 % on Room air Temp:     98.0 degrees F (36.67 degrees C) oral Pulse rate:   87 / minute BP sitting:   138 / 68  (left arm) Cuff size:   large  Vitals Entered By: Josph Macho CMA (January 17, 2009 1:28 PM)  O2 Flow:  Room air CC: Earache X4days/ pt states she is no longer taking the potassium/ CF Is Patient Diabetic? Yes   Referring Provider:  Clancy Gourd Primary Provider:  Newt Lukes MD  CC:  Earache X4days/ pt states she is no longer taking the potassium/ CF.  History of Present Illness: see above.  she has nasal congestion but no cough.    Current Medications (verified): 1)  Metformin Hcl 1000 Mg Tabs (Metformin Hcl) .Marland Kitchen.. 1 By Mouth Qam, 1 By Mouth Qpm 2)  Lipitor 20 Mg Tabs (Atorvastatin Calcium) .Marland Kitchen.. 1 By Mouth Once Daily 3)  Premarin 0.3 Mg Tabs (Estrogens Conjugated) .Marland Kitchen.. 1 By Mouth Qd 4)  Synthroid 50 Mcg Tabs (Levothyroxine Sodium) .Marland Kitchen.. 1 By Mouth Qd 5)  Urocit-K 10 1080 Mg Cr-Tabs (Potassium Citrate) .... 2 By Mouth Qam, 2 By Mouth Qpm 6)  Hydrochlorothiazide 25 Mg Tabs (Hydrochlorothiazide) .Marland Kitchen.. 1 By Mouth Qam 7)  Effexor Xr 150 Mg Xr24h-Cap (Venlafaxine Hcl) .Marland Kitchen.. 1 By Mouth Qam 8)  Abilify 20 Mg Tabs (Aripiprazole) .Marland Kitchen.. 1 By Mouth At Bedtime 9)  Lamotrigine 200 Mg Tabs (Lamotrigine) .Marland Kitchen.. 1 By Mouth Q Noon 10)  Clonazepam 1 Mg Tabs (Clonazepam) .Marland Kitchen.. 1 By Mouth At Bedtime 11)  Seroquel Xr 150 Mg Xr24h-Tab (Quetiapine Fumarate) .Marland Kitchen.. 1 in Am, 3 At Bedtime 12)  Fexofenadine Hcl 180 Mg Tabs (Fexofenadine Hcl) .Marland Kitchen.. 1 By Mouth Q Am 13)  Mirtazapine 30 Mg Tabs (Mirtazapine) .Marland Kitchen.. 1 By Mouth Qhs 14)  Multivitamins  Caps (Multiple Vitamin) .Marland Kitchen.. 1 By Mouth Once Daily 15)  Vitamin D-3  1000 Iu .Marland Kitchen.. 1 By Mouth At Bedtime 16)  Humalog 100 Unit/ml Soln (Insulin Lispro (Human)) .... 4 Times A Day  (Qac) 03-11-11- Units 17)  Topiramate 100 Mg Tabs (Topiramate) .Marland Kitchen.. 1 Tab Bid 18)  Novolin N 100 Unit/ml Susp (Insulin Isophane Human) .... 20 Units Qhs 19)  Furosemide 40 Mg Tabs (Furosemide) .Marland Kitchen.. 1 By Mouth Qd 20)  Onetouch Ultra Test  Strp (Glucose Blood) .Marland Kitchen.. 1 Strip 4 Times A Day 21)  Aspirin 81 Mg Tabs (Aspirin) .... Take 1 By Mouth Qd 22)  Potassium Chloride 20 Meq Pack (Potassium Chloride) .Marland Kitchen.. 1 By Mouth Once Daily 23)  Allopurinol 100 Mg Tabs (Allopurinol) .... 2 A Day  Allergies (verified): No Known Drug Allergies  Past History:  Past Medical History: Last updated: 12/24/2008 IDDM (ICD-250.01) ALLERGIC RHINITIS (ICD-477.9) ASYMPTOMATIC POSTMENOPAUSAL STATUS (ICD-V49.81) HYPERCHOLESTEROLEMIA (ICD-272.0) HYPERTENSION (ICD-401.9) BIPOLAR DISORDER UNSPECIFIED (ICD-296.80) HYPOTHYROIDISM (ICD-244.9) GOITER  physician roster: Dr. Bryson Ha at [facility]. Dr. Lucienne Minks at [facility] orthopedics Dr. Edison Pace Dr. Artist [place] Dr. Lavonia Drafts Dr. Everardo All - endo  Review of Systems  The patient denies fever and dyspnea on exertion.    Physical Exam  General:  obese.  no distress  Head:  head: no deformity eyes: no periorbital swelling, no proptosis external nose and ears are normal mouth: no lesion seen Ears:  both tm's have moderate fluid and  slight erythema   Impression & Recommendations:  Problem # 1:  URI (ICD-465.9)  Other Orders: Est. Patient Level III (40981)  Patient Instructions: 1)  cefuroxime 250 mg two times a day 2)  loratadine-d as needed congestion. 3)  return as needed

## 2010-05-07 NOTE — Progress Notes (Signed)
Summary: Rx refill req  Phone Note Call from Patient Call back at Work Phone [phone]   Caller: Patient Summary of Call: pt called requesting refill of Humolog Insulin to [facility] Initial call taken by: Margaret Pyle, CMA,  Aug 18, 2009 11:33 AM    Prescriptions: HUMALOG 100 UNIT/ML SOLN (INSULIN LISPRO (HUMAN)) 3 times a day (qac) 12-0-14- units  #39mth x 3   Entered by:   Margaret Pyle, CMA   Authorized by:   Newt Lukes MD   Signed by:   Margaret Pyle, CMA on 08/18/2009   Method used:   Faxed to ...       [facility])       [address] [place], [place]  [address]       Ph: [phone]       Fax: [phone]   RxID:   [id number]

## 2010-05-07 NOTE — Progress Notes (Signed)
Summary: Call Report  Phone Note Other Incoming   Caller: [facility] Call Report Summary of Call: pt called 05/13/2009 @ 5:26pm stating that she was out of town and forgot her Insulin at home. Dr. Laury Axon was paged and asked of it was okay to fill pt's Rx out of town. MD okay'd Humolog to [facility] [phone] @ 6:04pm) spoke with Robby, Pharmacist. Initial call taken by: Margaret Pyle, CMA,  May 14, 2009 9:06 AM

## 2010-05-07 NOTE — Assessment & Plan Note (Signed)
Summary: SINUS/NWS   Vital Signs:  Patient profile:   60 year old female Height:      63 inches (160.02 cm) Weight:      241.4 pounds (109.73 kg) O2 Sat:      97 % on Room air Temp:     98.2 degrees F (36.78 degrees C) oral Pulse rate:   83 / minute BP sitting:   128 / 74  (left arm) Cuff size:   large  Vitals Entered By: Orlan Leavens RMA (November 27, 2009 10:55 AM)  O2 Flow:  Room air CC: Ongoing sinus issues Is Patient Diabetic? Yes Did you bring your meter with you today? No Pain Assessment Patient in pain? no      Comments Pt want referral to see ENT   Primary Care Provider:  Newt Lukes MD  CC:  Ongoing sinus issues.  History of Present Illness: c/o sinus pain and pressure- located right maxillary region - onset >6 weeks ago precipitated by dental procedures a/w green discharge from right nostril no fever or vision change prior mild HA but now resolved  Current Medications (verified): 1)  Metformin Hcl 1000 Mg Tabs (Metformin Hcl) .Marland Kitchen.. 1 By Mouth Qam, 1 By Mouth Qpm 2)  Lipitor 20 Mg Tabs (Atorvastatin Calcium) .Marland Kitchen.. 1 By Mouth Once Daily 3)  Premarin 0.3 Mg Tabs (Estrogens Conjugated) .Marland Kitchen.. 1 By Mouth Qd 4)  Synthroid 50 Mcg Tabs (Levothyroxine Sodium) .Marland Kitchen.. 1 By Mouth Qd 5)  Urocit-K 10 1080 Mg Cr-Tabs (Potassium Citrate) .... 2 By Mouth Qam, 2 By Mouth Qpm 6)  Hydrochlorothiazide 25 Mg Tabs (Hydrochlorothiazide) .Marland Kitchen.. 1 By Mouth Qam 7)  Effexor Xr 150 Mg Xr24h-Cap (Venlafaxine Hcl) .Marland Kitchen.. 1 By Mouth Qam 8)  Abilify 20 Mg Tabs (Aripiprazole) .Marland Kitchen.. 1 By Mouth At Bedtime 9)  Lamotrigine 200 Mg Tabs (Lamotrigine) .Marland Kitchen.. 1 By Mouth Q Noon 10)  Clonazepam 1 Mg Tabs (Clonazepam) .Marland Kitchen.. 1 By Mouth At Bedtime 11)  Seroquel Xr 150 Mg Xr24h-Tab (Quetiapine Fumarate) .Marland Kitchen.. 1 in Am, 3 At Bedtime 12)  Fexofenadine Hcl 180 Mg Tabs (Fexofenadine Hcl) .Marland Kitchen.. 1 By Mouth Q Am 13)  Mirtazapine 30 Mg Tabs (Mirtazapine) .Marland Kitchen.. 1 By Mouth Qhs 14)  Multivitamins  Caps (Multiple Vitamin)  .Marland Kitchen.. 1 By Mouth Once Daily 15)  Vitamin D-3  1000 Iu .Marland Kitchen.. 1 By Mouth At Bedtime 16)  Humalog 100 Unit/ml Soln (Insulin Lispro (Human)) .... 3 Times A Day (Qac) 12-0-14- Units 17)  Topiramate 100 Mg Tabs (Topiramate) .Marland Kitchen.. 1 Tab Bid 18)  Novolin N 100 Unit/ml Susp (Insulin Isophane Human) .... 30 Units Qhs 19)  Onetouch Ultra Test  Strp (Glucose Blood) .Marland Kitchen.. 1 Strip 4 Times A Day 20)  Aspirin 81 Mg Tabs (Aspirin) .... Take 1 By Mouth Qd 21)  Oxybutynin Chloride 10 Mg Xr24h-Tab (Oxybutynin Chloride) .Marland Kitchen.. 1 At Pm 22)  Furosemide 40 Mg Tabs (Furosemide) .Marland Kitchen.. 1 By Mouth Qd 23)  Bd Insulin Syringe Ultrafine 31g X 5/16" 0.5 Ml Misc (Insulin Syringe-Needle U-100) .... Any Brand, 4x A Day 24)  Oxycodone-Acetaminophen 5-325 Mg Tabs (Oxycodone-Acetaminophen) .... Take 1 By Mouth Q 6 Hours As Needed 25)  Biotin 1000 Mcg Tabs (Biotin) .... Take 1 By Mouth Once Daily 26)  Hydroxyzine Hcl 25 Mg Tabs (Hydroxyzine Hcl) .Marland Kitchen.. 1 By Mouth Four Times A Day As Needed For Rash and Itch 27)  Triamcinolone Acetonide 0.1 % Lotn (Triamcinolone Acetonide) .... 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 Bryson Ha at [facility]. Dr. Lucienne Minks at [facility]  Dr. Edison Pace Dr. Artist Beach Dr. Lavonia Drafts Dr. Everardo All - endo Dr. Dierdre Forth - rheum  Review of Systems  The patient denies vision loss, chest pain, and headaches.    Physical Exam  General:  overweight-appearing.  alert, well-developed, well-nourished, and cooperative to examination.  nontoxic  Eyes:  vision grossly intact; pupils equal, round  and reactive to light.  conjunctiva and lids normal.    Ears:  normal pinnae bilaterally, without erythema, swelling, or tenderness to palpation. TMs hazy but without erythema and without effusion, no cerumen impaction. Hearing grossly normal bilaterally  Mouth:  teeth and gums in fair repair; mucous membranes moist, without lesions or ulcers. oropharynx clear without exudate, no erythema. +shallow base apthous ulcer on left lateral side of tongue Lungs:  normal respiratory effort, no intercostal retractions or use of accessory muscles; normal breath sounds bilaterally - no crackles and no wheezes.    Heart:  normal rate, regular rhythm, no murmur, and no rub. BLE without edema.    Impression & Recommendations:  Problem # 1:  SINUSITIS (ICD-473.9)  Her updated medication list for this problem includes:    Augmentin 875-125 Mg Tabs (Amoxicillin-pot clavulanate) .Marland Kitchen... 1 by mouth two times a day x 7 days    Flonase 50 Mcg/act Susp (Fluticasone propionate) .Marland Kitchen... 2 sprays each nostril  every morning  Take antibiotics for full duration. Discussed treatment options and pref for ENT referral.   Orders: Prescription Created Electronically [id number]) ENT Referral (ENT)  Complete Medication List: 1)  Metformin Hcl 1000 Mg Tabs (Metformin hcl) .Marland Kitchen.. 1 by mouth qam, 1 by mouth qpm 2)  Lipitor 20 Mg Tabs (Atorvastatin calcium) .Marland Kitchen.. 1 by mouth once daily 3)  Premarin 0.3 Mg Tabs (Estrogens conjugated) .Marland Kitchen.. 1 by mouth qd 4)  Synthroid 50 Mcg Tabs (Levothyroxine sodium) .Marland Kitchen.. 1 by mouth qd 5)  Urocit-k 10 1080 Mg Cr-tabs (Potassium citrate) .... 2 by mouth qam, 2 by mouth qpm 6)  Hydrochlorothiazide 25 Mg Tabs (Hydrochlorothiazide) .Marland Kitchen.. 1 by mouth qam 7)  Effexor Xr 150 Mg Xr24h-cap (Venlafaxine hcl) .Marland Kitchen.. 1 by mouth qam 8)  Abilify 20 Mg Tabs (Aripiprazole) .Marland Kitchen.. 1 by mouth at bedtime 9)  Lamotrigine 200 Mg Tabs (Lamotrigine) .Marland Kitchen.. 1 by mouth q noon 10)  Clonazepam 1 Mg Tabs (Clonazepam) .Marland Kitchen.. 1 by mouth at  bedtime 11)  Seroquel Xr 150 Mg Xr24h-tab (Quetiapine fumarate) .Marland Kitchen.. 1 in am, 3 at bedtime 12)  Fexofenadine Hcl 180 Mg Tabs (Fexofenadine hcl) .Marland Kitchen.. 1 by mouth q am 13)  Mirtazapine 30 Mg Tabs (Mirtazapine) .Marland Kitchen.. 1 by mouth qhs 14)  Multivitamins Caps (Multiple vitamin) .Marland Kitchen.. 1 by mouth once daily 15)  Vitamin D-3 1000 Iu  .Marland Kitchen.. 1 by mouth at bedtime 16)  Humalog 100 Unit/ml Soln (Insulin lispro (human)) .... 3 times a day (qac) 12-0-14- units 17)  Topiramate 100 Mg Tabs (Topiramate) .Marland Kitchen.. 1 tab bid 18)  Novolin N 100 Unit/ml Susp (Insulin isophane human) .... 30 units qhs 19)  Onetouch Ultra Test Strp (Glucose blood) .Marland Kitchen.. 1 strip 4 times a day 20)  Aspirin 81 Mg Tabs (Aspirin) .... Take 1 by mouth qd 21)  Oxybutynin Chloride 10 Mg Xr24h-tab (Oxybutynin chloride) .Marland Kitchen.. 1 at pm 22)  Furosemide 40 Mg Tabs (Furosemide) .Marland Kitchen.. 1 by mouth qd 23)  Bd Insulin Syringe Ultrafine 31g X 5/16" 0.5 Ml Misc (Insulin syringe-needle u-100) .... Any brand, 4x a day 24)  Oxycodone-acetaminophen 5-325 Mg Tabs (Oxycodone-acetaminophen) .... Take 1 by mouth q 6 hours as needed 25)  Biotin 1000 Mcg Tabs (Biotin) .... Take 1 by mouth once daily 26)  Augmentin 875-125 Mg Tabs (Amoxicillin-pot clavulanate) .Marland Kitchen.. 1 by mouth two times a day x 7 days 27)  Flonase 50 Mcg/act Susp (Fluticasone propionate) .... 2 sprays each nostril  every morning  Patient Instructions: 1)  it was good to see you today. 2)  antibioitcs and flonase for your sinus symptoms as discussed - your prescriptions have been electronically submitted to your pharmacy. Please take as directed. Contact our office if you believe you're having problems with the medication(s).  3)  we'll make referral to ENT as requested. Our office will contact you regarding this appointment once made.  4)  Please keep follow-up appointment as previously scheduled, call sooner if problems.  Prescriptions: FLONASE 50 MCG/ACT SUSP (FLUTICASONE PROPIONATE) 2 sprays each nostril   every morning  #1 x 6   Entered and Authorized by:   Newt Lukes MD   Signed by:   Newt Lukes MD on 11/27/2009   Method used:   [facility]        [facility] [phone][facility] (retail)       [address] [place], [place]  [address]       Ph: [phone] or [phone]       Fax: [phone]   RxID:   [id number] AUGMENTIN 875-125 MG TABS (AMOXICILLIN-POT CLAVULANATE) 1 by mouth two times a day x 7 days  #14 x 0   Entered and Authorized by:   Newt Lukes MD   Signed by:   Newt Lukes MD on 11/27/2009   Method used:   [facility] to        [facility] [phone]* (retail)       [address] [place], [place]  [address]       Ph: [phone] or [phone]       Fax: [phone]   RxID:   [id number]

## 2010-05-07 NOTE — Progress Notes (Signed)
Summary: send rx  Phone Note Call from Patient Call back at Home Phone [phone]   Caller: Patient/[phone] Call For: Newt Lukes MD Summary of Call: per Lanna Poche call still having swelling in her feet right feet what should she do? Initial call taken by: Shelbie Proctor,  September 09, 2008 11:27 AM  Follow-up for Phone Call        labs done by Dr. Everardo All recently - reviewed: they show no congestive heart failure and no blood clot. Will prescribe Lasix 20 mg daily to be taken for one week then as needed for swelling, dispense #30, zero refills. followup if persistent swelling after one week Follow-up by: Newt Lukes MD,  September 09, 2008 8:44 PM  Additional Follow-up for Phone Call Additional follow up Details #1::          called pt to inform  made aware want this rx sent to   [facility] [phone][facility] (retail) [address] [place], [place]  [address] Ph: [phone] or [phone] Fax: [phone] Additional Follow-up by: Shelbie Proctor,  September 10, 2008 8:58 AM    Additional Follow-up for Phone Call Additional follow up Details #2::    [facility] Follow-up by: Orlan Leavens,  September 10, 2008 9:10 AM  New/Updated Medications: LASIX 20 MG TABS (FUROSEMIDE) TAKE 1 by mouth once daily PRN   Prescriptions: LASIX 20 MG TABS (FUROSEMIDE) TAKE 1 by mouth once daily PRN  #30 x 0   Entered by:   Orlan Leavens   Authorized by:   Newt Lukes MD   Signed by:   Orlan Leavens on 09/10/2008   Method used:   [facility]        [facility] [address] [phone]* (retail)       [address] [place], [place]  [address]       Ph: [phone] or [phone]       Fax: [phone]   RxID:   [id number]

## 2010-05-07 NOTE — Assessment & Plan Note (Signed)
Summary: PER DEBRA -SAME DY AS DR ELLISON--$50-FEET SWELLING-STC   Vital Signs:  Patient profile:   60 year old female Height:      63 inches (160.02 cm) Weight:      245.4 pounds (111.55 kg) O2 Sat:      96 % Temp:     98.3 degrees F (36.83 degrees C) oral Pulse rate:   94 / minute BP sitting:   134 / 76  (left arm) Cuff size:   large  Vitals Entered By: Orlan Leavens (September 25, 2008 9:19 AM) CC: Swelling in her feet Is Patient Diabetic? Yes  Pain Assessment Patient in pain? no        Primary Care Deneene Tarver:  Newt Lukes MD  CC:  Swelling in her feet.  History of Present Illness: cont swelling R>L feet ongoing now over 4 weeks stopped Mobic 1 weeks ago - not improved swelling no joint pains no foot pain no CP or SOB no prev leg/foot swelling no change urination habits weight actaully down 5# since last vist taking lasix 20 once daily since 6/8 but not improved  Dimer, BNP, Cr and LFTS all normal last checked  Clinical Review Panels:  Complete Metabolic Panel   Glucose:  368 (09/03/2008)   Sodium:  138 (09/03/2008)   Potassium:  4.0 (09/03/2008)   Chloride:  103 (09/03/2008)   CO2:  30 (09/03/2008)   BUN:  11 (09/03/2008)   Creatinine:  0.8 (09/03/2008)   Albumin:  3.5 (09/03/2008)   Total Protein:  6.6 (09/03/2008)   Calcium:  9.3 (09/03/2008)   Total Bili:  0.6 (09/03/2008)   Alk Phos:  130 (09/03/2008)   SGPT (ALT):  33 (09/03/2008)   SGOT (AST):  37 (09/03/2008)   Current Medications (verified): 1)  Metformin Hcl 1000 Mg Tabs (Metformin Hcl) .Marland Kitchen.. 1 By Mouth Qam, 1 By Mouth Qpm 2)  Lipitor 20 Mg Tabs (Atorvastatin Calcium) .Marland Kitchen.. 1 By Mouth Once Daily 3)  Premarin 0.3 Mg Tabs (Estrogens Conjugated) .Marland Kitchen.. 1 By Mouth Qd 4)  Synthroid 50 Mcg Tabs (Levothyroxine Sodium) .Marland Kitchen.. 1 By Mouth Qd 5)  Urocit-K 10 1080 Mg Cr-Tabs (Potassium Citrate) .... 2 By Mouth Qam, 2 By Mouth Qpm 6)  Hydrochlorothiazide 25 Mg Tabs (Hydrochlorothiazide) .Marland Kitchen.. 1 By Mouth  Qam 7)  Effexor Xr 150 Mg Xr24h-Cap (Venlafaxine Hcl) .Marland Kitchen.. 1 By Mouth Qam 8)  Abilify 20 Mg Tabs (Aripiprazole) .Marland Kitchen.. 1 By Mouth At Bedtime 9)  Lamotrigine 200 Mg Tabs (Lamotrigine) .Marland Kitchen.. 1 By Mouth Q Noon 10)  Clonazepam 1 Mg Tabs (Clonazepam) .Marland Kitchen.. 1 By Mouth At Bedtime 11)  Seroquel Xr 150 Mg Xr24h-Tab (Quetiapine Fumarate) .Marland Kitchen.. 1 in Am, 3 At Bedtime 12)  Fexofenadine Hcl 180 Mg Tabs (Fexofenadine Hcl) .Marland Kitchen.. 1 By Mouth Q Am 13)  Mirtazapine 30 Mg Tabs (Mirtazapine) .Marland Kitchen.. 1 By Mouth Qhs 14)  Multivitamins  Caps (Multiple Vitamin) .Marland Kitchen.. 1 By Mouth Once Daily 15)  Vitamin D-3  1000 Iu .Marland Kitchen.. 1 By Mouth At Bedtime 16)  Humalog 100 Unit/ml Soln (Insulin Lispro (Human)) .... 4 Times A Day (Qac) 03-11-11- Units 17)  Topiramate 100 Mg Tabs (Topiramate) .Marland Kitchen.. 1 Tab Bid 18)  Novolin N 100 Unit/ml Susp (Insulin Isophane Human) .Marland Kitchen.. 10 Units Qhs 19)  Lasix 20 Mg Tabs (Furosemide) .... Take 1 By Mouth Once Daily Prn 20)  Onetouch Ultra Test  Strp (Glucose Blood) .Marland Kitchen.. 1 Strip 4 Times A Day 21)  Aspirin 81 Mg Tabs (Aspirin) .... Take 1 By Mouth  Qd  Allergies (verified): No Known Drug Allergies  Past History:  Past Medical History: Reviewed history from 09/03/2008 and no changes required. 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 Bryson Ha at [facility]. Dr. Turner Daniels at [facility] orthopedics Dr. Edison Pace Dr. Artist Beach Dr. Lavonia Drafts  Review of Systems       The patient complains of peripheral edema.  The patient denies anorexia, fever, weight gain, hoarseness, chest pain, syncope, dyspnea on exertion, prolonged cough, headaches, and abdominal pain.    Physical Exam  General:  overweight-appearing. alert, well-developed, well-nourished, and cooperative to examination.    Neck:   No deformities, masses, or tenderness noted. Lungs:  normal respiratory effort, no intercostal retractions or use of accessory muscles; normal breath sounds bilaterally - no crackles and no wheezes.    Heart:  normal rate, regular rhythm, no murmur, and no rub. BLE with edema 1+ pitting R foot, trace L foot.  Abdomen:  soft, non-tender, normal bowel sounds, no distention; no masses and no appreciable hepatomegaly or splenomegaly.   Msk:  no joint effusions or swelling Skin:  no erythema, ulcerations, or rash   Impression & Recommendations:  Problem # 1:  EDEMA (ICD-782.3)  recheck basic metabolic today to monitor electrolytes and creatinine +LFTs (all normal 6/1) Check urinalysis to rule out proteinuria - again was neg 6/1 Previous d-dimer and BNP normal on 6/4 and doppler neg for DVT meanwhile inc Lasix 40 once daily  watch wieghts recheck 2 weeks consider Echo for r/o diast dysfx if still with edema 2 weeks Her updated medication list for this problem includes:    Hydrochlorothiazide 25 Mg Tabs (Hydrochlorothiazide) .Marland Kitchen... 1 by mouth qam    Furosemide 40 Mg Tabs (Furosemide) .Marland Kitchen... 1 by mouth qd  Orders: TLB-BMP (Basic Metabolic Panel-BMET) (80048-METABOL) TLB-Hepatic/Liver Function Pnl (80076-HEPATIC) TLB-Udip ONLY (81003-UDIP) Prescription Created Electronically [phone])  Complete Medication List: 1)  Metformin Hcl 1000 Mg Tabs (Metformin hcl) .Marland Kitchen.. 1 by mouth qam, 1 by mouth qpm 2)  Lipitor 20 Mg Tabs (Atorvastatin calcium) .Marland Kitchen.. 1 by mouth once daily 3)  Premarin 0.3 Mg Tabs (Estrogens conjugated) .Marland Kitchen.. 1 by mouth qd 4)  Synthroid 50 Mcg Tabs (Levothyroxine sodium) .Marland Kitchen.. 1 by mouth qd 5)  Urocit-k 10 1080 Mg Cr-tabs (Potassium citrate) .... 2 by mouth qam, 2 by mouth qpm 6)  Hydrochlorothiazide 25 Mg Tabs (Hydrochlorothiazide) .Marland Kitchen.. 1 by mouth qam 7)  Effexor Xr 150 Mg Xr24h-cap (Venlafaxine hcl) .Marland Kitchen.. 1 by mouth qam 8)  Abilify 20 Mg Tabs (Aripiprazole) .Marland Kitchen.. 1 by mouth at  bedtime 9)  Lamotrigine 200 Mg Tabs (Lamotrigine) .Marland Kitchen.. 1 by mouth q noon 10)  Clonazepam 1 Mg Tabs (Clonazepam) .Marland Kitchen.. 1 by mouth at bedtime 11)  Seroquel Xr 150 Mg Xr24h-tab (Quetiapine fumarate) .Marland Kitchen.. 1 in am, 3 at bedtime 12)  Fexofenadine Hcl 180 Mg Tabs (Fexofenadine hcl) .Marland Kitchen.. 1 by mouth q am 13)  Mirtazapine 30 Mg Tabs (Mirtazapine) .Marland Kitchen.. 1 by mouth qhs 14)  Multivitamins Caps (Multiple vitamin) .Marland Kitchen.. 1 by mouth once daily 15)  Vitamin D-3 1000 Iu  .Marland Kitchen.. 1 by mouth at bedtime 16)  Humalog 100 Unit/ml Soln (Insulin lispro (human)) .... 4 times a day (qac) 03-11-11- units 17)  Topiramate 100 Mg Tabs (Topiramate) .Marland Kitchen.. 1 tab bid 18)  Novolin N 100 Unit/ml Susp (Insulin isophane human) .Marland Kitchen.. 10 units qhs 19)  Furosemide 40 Mg Tabs (Furosemide) .Marland Kitchen.. 1 by mouth qd 20)  Onetouch Ultra Test Strp (Glucose blood) .Marland Kitchen.. 1 strip 4 times a day 21)  Aspirin 81 Mg Tabs (Aspirin) .... Take 1 by mouth qd  Patient Instructions: 1)  we'll recheck your labs today to monitor kidney function and liver function. 2)  Will increase your fluid pill (furosemide) to 40 mg daily 3)  Monitor your weight at home and call us if weight is increasing more than 3 pounds 4)  Recheck with office visit here in 2 weeks Prescriptions: FUROSEMIDE 40 MG TABS (FUROSEMIDE) 1 by mouth qd  #30 x 2   Entered and Authorized by:   Newt Lukes MD   Signed by:   Newt Lukes MD on 09/25/2008   Method used:   [facility] to        [facility] [phone]* (retail)       [address] [place], [place]  [address]       Ph: [phone] or [phone]       Fax: [phone]   RxID:   [id number]

## 2010-05-07 NOTE — Assessment & Plan Note (Signed)
Summary: NEW MEDICARE  $50  STC   Vital Signs:  Patient profile:   61 year old female Height:      63 inches (160.02 cm) Weight:      251.6 pounds (114.36 kg) O2 Sat:      94 % on Room air Temp:     98.3 degrees F (36.83 degrees C) oral Pulse rate:   98 / minute BP sitting:   142 / 88  (left arm) Cuff size:   large  Vitals Entered By: Orlan Leavens (September 03, 2008 9:11 AM)  O2 Flow:  Room air CC: New patient est Is Patient Diabetic? Yes  Pain Assessment Patient in pain? no       Vision Screening:      Vision Comments: Done 07/26/08 @ [facility]/ Dr. Lovell Sheehan   Primary Care Sura Canul:  Newt Lukes MD  CC:  New patient est.  History of Present Illness: here today to establish care - prev with dr. Sonia Baller  DM - home CBGs avg 250 - follows with dr. Everardo All - feels weight is going up - making insulin changes for better mgmt.   hypothyroid - as noted, complains of increasing weight. No recent thyroid check or thyroid dose changes. No palpitations, or skin changes noted  bipolar - dr. Raquel James counsels and manages her medications regularly. Patient feels her symptoms are under reasonable control at this time. Has not had any medication or dose changes recently  HTN  -swelling of feet bilaterally this am - no previous swelling. Fracture both feet. Believes she had extra salt in her diet compared to usual with [place] meal last night. No shortness of breath. No chest pain  history of recurrent kidney stones. Follows with Dr. Bryson Ha at [facility]. No current symptoms.  Current Medications (verified): 1)  Metformin Hcl 1000 Mg Tabs (Metformin Hcl) .Marland Kitchen.. 1 By Mouth Qam, 1 By Mouth Qpm 2)  Lipitor 20 Mg Tabs (Atorvastatin Calcium) .Marland Kitchen.. 1 By Mouth Once Daily 3)  Premarin 0.3 Mg Tabs (Estrogens Conjugated) .Marland Kitchen.. 1 By Mouth Qd 4)  Synthroid 50 Mcg Tabs (Levothyroxine Sodium) .Marland Kitchen.. 1 By Mouth Qd 5)  Urocit-K 10 1080 Mg Cr-Tabs (Potassium Citrate) .... 2 By Mouth Qam, 2 By  Mouth Qpm 6)  Hydrochlorothiazide 25 Mg Tabs (Hydrochlorothiazide) .Marland Kitchen.. 1 By Mouth Qam 7)  Effexor Xr 150 Mg Xr24h-Cap (Venlafaxine Hcl) .Marland Kitchen.. 1 By Mouth Qam 8)  Abilify 20 Mg Tabs (Aripiprazole) .Marland Kitchen.. 1 By Mouth At Bedtime 9)  Lamotrigine 200 Mg Tabs (Lamotrigine) .Marland Kitchen.. 1 By Mouth Q Noon 10)  Clonazepam 1 Mg Tabs (Clonazepam) .Marland Kitchen.. 1 By Mouth At Bedtime 11)  Seroquel Xr 150 Mg Xr24h-Tab (Quetiapine Fumarate) .Marland Kitchen.. 1 in Am, 3 At Bedtime 12)  Fexofenadine Hcl 180 Mg Tabs (Fexofenadine Hcl) .Marland Kitchen.. 1 By Mouth Q Am 13)  Mirtazapine 30 Mg Tabs (Mirtazapine) .Marland Kitchen.. 1 By Mouth Qhs 14)  Multivitamins  Caps (Multiple Vitamin) .Marland Kitchen.. 1 By Mouth Once Daily 15)  Vitamin D-3  1000 Iu .Marland Kitchen.. 1 By Mouth At Bedtime 16)  Humalog 100 Unit/ml Soln (Insulin Lispro (Human)) .... 4 Times A Day (Qac) 03-11-11-4 Units 17)  Topiramate 100 Mg Tabs (Topiramate) .Marland Kitchen.. 1 Tab Bid 18)  Novolin N 100 Unit/ml Susp (Insulin Isophane Human) .... 4 Units Qhs 19)  Meloxicam 15 Mg Tabs (Meloxicam) .... Take 1 Q Am  Allergies (verified): No Known Drug Allergies  Comments:  Nurse/Medical Assistant: The patient's medications and allergies were reviewed with the patient and were  updated in the Medication and Allergy Lists. 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 Bryson Ha at [facility]. Dr. Turner Daniels at [facility] Dr. Edison Pace Dr. Artist [place] Dr. Lavonia Drafts  Past Surgical History: Cholecystectomy (1985) Hysterectomy (1976) Tubal ligation (1972) (L) Ovary removed 1980 (R) nasal surgery (08/1988) (R) sinus removed tooth partial (08/1989) Percitania stone removed )L) kidney (1992) Lithotripsy (L) kidney (1997) (L) knee  arthroscopic partial medical menisectomy  (L) Cystoscopy 1990  Family History: father died age 66 - MI, DM2, HTN, dyslipidemia mom - alive age 44 - MI age 79, HTN +CAD/MI = paternal siblings  Social History: married, lives with spouse 2 grown kids - 6 g-kids disabled former smoker - quit 1985  Review of Systems       see HPI above. I have reviewed all other systems and they were negative.   Physical Exam  General:  overweight-appearing.  alert, well-developed, well-nourished, and cooperative to examination.    Eyes:  vision grossly intact; pupils equal, round and reactive to light.  conjunctiva and lids normal.    Ears:  normal pinnae bilaterally, without erythema, swelling, or tenderness to palpation. TMs clear, without effusion, or cerumen impaction. Hearing grossly normal bilaterally  Mouth:  teeth and gums in good repair; mucous membranes moist, without lesions or ulcers. oropharynx clear without exudate, erythema.  Lungs:  normal respiratory effort, no intercostal retractions or use of accessory muscles; normal breath sounds bilaterally - no crackles and no wheezes.    Heart:  normal rate, regular rhythm, no murmur, and no rub. BLE with trace edema in feet. normal DP pulses and normal cap refill in all 4 extremities    Abdomen:  soft, non-tender, normal bowel sounds, no distention; no masses and no appreciable hepatomegaly or splenomegaly.   Msk:  no joint effusions or deformities Neurologic:  alert & oriented X3 and cranial nerves II-XII symetrically intact.  strength normal in all extremities, sensation intact to light touch, and gait normal. speech fluent without dysarthria or aphasia; follows commands with good comprehension.  Skin:  no rashes, vesicles, ulcers, or erythema. No nodules or irregularity to palpation.  Psych:  Oriented X3, memory intact for recent and remote, normally interactive, good eye contact, not anxious appearing, not depressed appearing, and not agitated.    mildly flat affect  Diabetes Management Exam:    Eye Exam:       Eye Exam done elsewhere          Date: 07/26/2008          Results: normal          Done by: 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 Everardo All. Continue same Her updated medication list for this problem includes:    Metformin Hcl 1000 Mg Tabs (Metformin hcl) .Marland Kitchen... 1 by mouth qam, 1 by mouth qpm    Humalog 100 Unit/ml Soln (Insulin lispro (human)) .Marland KitchenMarland KitchenMarland KitchenMarland Kitchen 4 times a day (qac) 03-11-11-4 units    Novolin N 100 Unit/ml Susp (Insulin isophane human) .Marland KitchenMarland KitchenMarland KitchenMarland Kitchen 4 units qhs  Orders: TLB-A1C / Hgb A1C (Glycohemoglobin) (83036-A1C)  Problem # 2:  HYPERCHOLESTEROLEMIA (ICD-272.0) has not had any adverse side effects related to statin. Recheck lipids today and LFTs to monitor for adverse drug effects Pending these results,  plan to continue same Her updated medication list for this problem includes:    Lipitor 20 Mg Tabs (Atorvastatin calcium) .Marland Kitchen... 1 by mouth once daily  Orders: TLB-Lipid Panel (80061-LIPID)  Problem # 3:  HYPERTENSION (ICD-401.9) given new edema, will check a BMet and urinalysis to rule out renal disease or proteinuria. most likely related to dietary indiscretion with increased salt at last night supper Her updated medication list for this problem includes:    Hydrochlorothiazide 25 Mg Tabs (Hydrochlorothiazide) .Marland Kitchen... 1 by mouth qam  Orders: TLB-BMP (Basic Metabolic Panel-BMET) (80048-METABOL) TLB-Udip ONLY (81003-UDIP)  Problem # 4:  HYPOTHYROIDISM (ICD-244.9) weight gain as noted by history. We'll check TSH, and adjust medications as needed Her updated medication list for this problem includes:    Synthroid 50 Mcg Tabs (Levothyroxine sodium) .Marland Kitchen... 1 by mouth qd  Orders: TLB-TSH (Thyroid Stimulating Hormone) (84443-TSH)  Problem # 5:  BIPOLAR DISORDER UNSPECIFIED (ICD-296.80) continue management  as ongoing with Dr. Raquel James.  Problem # 6:  MORBID OBESITY (ICD-278.01)  Ht: 63 (09/03/2008)   Wt: 251.6 (09/03/2008)   BMI: 43.38 (07/25/2008) discussion with patient regarding increased activity and continued attention to diet for weight control and efforts on weight loss  Complete Medication List: 1)  Metformin Hcl 1000 Mg Tabs (Metformin hcl) .Marland Kitchen.. 1 by mouth qam, 1 by mouth qpm 2)  Lipitor 20 Mg Tabs (Atorvastatin calcium) .Marland Kitchen.. 1 by mouth once daily 3)  Premarin 0.3 Mg Tabs (Estrogens conjugated) .Marland Kitchen.. 1 by mouth qd 4)  Synthroid 50 Mcg Tabs (Levothyroxine sodium) .Marland Kitchen.. 1 by mouth qd 5)  Urocit-k 10 1080 Mg Cr-tabs (Potassium citrate) .... 2 by mouth qam, 2 by mouth qpm 6)  Hydrochlorothiazide 25 Mg Tabs (Hydrochlorothiazide) .Marland Kitchen.. 1 by mouth qam 7)  Effexor Xr 150 Mg Xr24h-cap (Venlafaxine hcl) .Marland Kitchen.. 1 by mouth qam 8)  Abilify 20 Mg Tabs (Aripiprazole) .Marland Kitchen.. 1 by mouth at bedtime 9)  Lamotrigine 200 Mg Tabs (Lamotrigine) .Marland Kitchen.. 1 by mouth q noon 10)  Clonazepam 1 Mg Tabs (Clonazepam) .Marland Kitchen.. 1 by mouth at bedtime 11)  Seroquel Xr 150 Mg Xr24h-tab (Quetiapine fumarate) .Marland Kitchen.. 1 in am, 3 at bedtime 12)  Fexofenadine Hcl 180 Mg Tabs (Fexofenadine hcl) .Marland Kitchen.. 1 by mouth q am 13)  Mirtazapine 30 Mg Tabs (Mirtazapine) .Marland Kitchen.. 1 by mouth qhs 14)  Multivitamins Caps (Multiple vitamin) .Marland Kitchen.. 1 by mouth once daily 15)  Vitamin D-3 1000 Iu  .Marland Kitchen.. 1 by mouth at bedtime 16)  Humalog 100 Unit/ml Soln (Insulin lispro (human)) .... 4 times a day (qac) 03-11-11-4 units 17)  Topiramate 100 Mg Tabs (Topiramate) .Marland Kitchen.. 1 tab bid 18)  Novolin N 100 Unit/ml Susp (Insulin isophane human) .... 4 units qhs 19)  Meloxicam 15 Mg Tabs (Meloxicam) .... Take 1 q am  Other Orders: TLB-Hepatic/Liver Function Pnl (80076-HEPATIC)  Patient Instructions: 1)  pleasure meeting you today. 2)  Will do labs including cholesterol, liver function test, A1c, TSH to evaluate ongoing treatment for your cholesterol, diabetes, and thyroid disease,  respectively. you'll receive a lab letter on this information regarding results and if any changes are needed. 3)  Followup visit 3 months or as needed for routine maintenance of these health issues 4)  continue all medications as currently prescribed     Bone Density  Procedure date:  09/30/2006  Findings:      Done @ [facility] Normal  Exercise Stress Test  Procedure date:  08/03/2007  Findings:      DONE @ [facility]  Colonoscopy  Procedure date:  09/03/2004  Findings:      DONE @ DR. Charna Elizabeth OFFICE Results: Normal.   Mammogram  Procedure date:  03/20/2008  Findings:      No specific mammographic evidence of malignancy.    Imaging Exam  Procedure date:  11/02/2007  Findings:      MULTINODULAR GOITER DONE @ [facility]

## 2010-05-07 NOTE — Consult Note (Signed)
Summary: [facility]  [facility]   Imported By: Lester  01/05/2010 07:57:28  _____________________________________________________________________  External Attachment:    Type:   Image     Comment:   External Document

## 2010-05-07 NOTE — Assessment & Plan Note (Signed)
Summary: SHINGLES?  SEEING DR Everardo All AT 9:15/NWS   Vital Signs:  Patient profile:   60 year old female Height:      63 inches (160.02 cm) Weight:      239.8 pounds (109 kg) O2 Sat:      94 % on Room air Temp:     98.4 degrees F (36.89 degrees C) oral Pulse rate:   95 / minute BP sitting:   130 / 88  (left arm) Cuff size:   large  Vitals Entered By: Orlan Leavens (October 10, 2009 2:22 PM)  O2 Flow:  Room air CC: ? shingles on her back & stomach, Rash Is Patient Diabetic? Yes Did you bring your meter with you today? No   Primary Care Provider:  Newt Lukes MD  CC:  ? shingles on her back & stomach and Rash.  History of Present Illness:  Rash      This is a 60 year old woman who presents with Rash.  The symptoms began 3 days ago.  The severity is described as moderate.  started on front abd, now on back flank as well -.  The patient reports papules, nodules, and itching, but denies hives and blisters.  The rash is located on the back and abdomen.  The rash is worse with heat, worse with scratching, and worse with sweating.  Associated symptoms include burning.  The patient denies the following symptoms: fever, headache, difficulty breathing, abdominal pain, nausea, vomiting, diarrhea, dizziness, sore throat, and arthralgias.  The patient reports a history of recent travel.  The patient denies history of recent tick bite, other insect bite, recent infection, recent antibiotic use, and pet/animal contact.    Current Medications (verified): 1)  Metformin Hcl 1000 Mg Tabs (Metformin Hcl) .Marland Kitchen.. 1 By Mouth Qam, 1 By Mouth Qpm 2)  Lipitor 20 Mg Tabs (Atorvastatin Calcium) .Marland Kitchen.. 1 By Mouth Once Daily 3)  Premarin 0.3 Mg Tabs (Estrogens Conjugated) .Marland Kitchen.. 1 By Mouth Qd 4)  Synthroid 50 Mcg Tabs (Levothyroxine Sodium) .Marland Kitchen.. 1 By Mouth Qd 5)  Urocit-K 10 1080 Mg Cr-Tabs (Potassium Citrate) .... 2 By Mouth Qam, 2 By Mouth Qpm 6)  Hydrochlorothiazide 25 Mg Tabs (Hydrochlorothiazide) .Marland Kitchen.. 1 By  Mouth Qam 7)  Effexor Xr 150 Mg Xr24h-Cap (Venlafaxine Hcl) .Marland Kitchen.. 1 By Mouth Qam 8)  Abilify 20 Mg Tabs (Aripiprazole) .Marland Kitchen.. 1 By Mouth At Bedtime 9)  Lamotrigine 200 Mg Tabs (Lamotrigine) .Marland Kitchen.. 1 By Mouth Q Noon 10)  Clonazepam 1 Mg Tabs (Clonazepam) .Marland Kitchen.. 1 By Mouth At Bedtime 11)  Seroquel Xr 150 Mg Xr24h-Tab (Quetiapine Fumarate) .Marland Kitchen.. 1 in Am, 3 At Bedtime 12)  Fexofenadine Hcl 180 Mg Tabs (Fexofenadine Hcl) .Marland Kitchen.. 1 By Mouth Q Am 13)  Mirtazapine 30 Mg Tabs (Mirtazapine) .Marland Kitchen.. 1 By Mouth Qhs 14)  Multivitamins  Caps (Multiple Vitamin) .Marland Kitchen.. 1 By Mouth Once Daily 15)  Vitamin D-3  1000 Iu .Marland Kitchen.. 1 By Mouth At Bedtime 16)  Humalog 100 Unit/ml Soln (Insulin Lispro (Human)) .... 3 Times A Day (Qac) 12-0-14- Units 17)  Topiramate 100 Mg Tabs (Topiramate) .Marland Kitchen.. 1 Tab Bid 18)  Novolin N 100 Unit/ml Susp (Insulin Isophane Human) .... 30 Units Qhs 19)  Onetouch Ultra Test  Strp (Glucose Blood) .Marland Kitchen.. 1 Strip 4 Times A Day 20)  Aspirin 81 Mg Tabs (Aspirin) .... Take 1 By Mouth Qd 21)  Oxybutynin Chloride 10 Mg Xr24h-Tab (Oxybutynin Chloride) .Marland Kitchen.. 1 At Pm 22)  Furosemide 40 Mg Tabs (Furosemide) .Marland KitchenMarland KitchenMarland Kitchen 1  By Mouth Qd 23)  Bd Insulin Syringe Ultrafine 31g X 5/16" 0.5 Ml Misc (Insulin Syringe-Needle U-100) .... Any Brand, 4x A Day 24)  Oxycodone-Acetaminophen 5-325 Mg Tabs (Oxycodone-Acetaminophen) .... Take 1 By Mouth Q 6 Hours As Needed 25)  Biotin 1000 Mcg Tabs (Biotin) .... 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 Bryson Ha at [facility]. Dr. Lucienne Minks at [facility]  Dr. Edison Pace Dr. Artist [place] Dr. Lavonia Drafts Dr. Everardo All - endo  Review of  Systems       The patient complains of suspicious skin lesions.  The patient denies chest pain, peripheral edema, and headaches.    Physical Exam  General:  overweight-appearing.  alert, well-developed, well-nourished, and cooperative to examination.  nontoxic  Lungs:  normal respiratory effort, no intercostal retractions or use of accessory muscles; normal breath sounds bilaterally - no crackles and no wheezes.    Heart:  normal rate, regular rhythm, no murmur, and no rub. BLE without edema.  Skin:  excema changes along bilateral flank and LQs of front abd - +excoriation - no insect bite or infx signs    Impression & Recommendations:  Problem # 1:  DERMATITIS (ICD-692.9)  will use oral antihistamin bfore oral steroid given DM2 hx - also topical steroids Her updated medication list for this problem includes:    Fexofenadine Hcl 180 Mg Tabs (Fexofenadine hcl) .Marland Kitchen... 1 by mouth q am    Triamcinolone Acetonide 0.1 % Lotn (Triamcinolone acetonide) .Marland Kitchen... Apply to affected skin three times a day as needed  Orders: Prescription Created Electronically [phone])  Complete Medication List: 1)  Metformin Hcl 1000 Mg Tabs (Metformin hcl) .Marland Kitchen.. 1 by mouth qam, 1 by mouth qpm 2)  Lipitor 20 Mg Tabs (Atorvastatin calcium) .Marland Kitchen.. 1 by mouth once daily 3)  Premarin 0.3 Mg Tabs (Estrogens conjugated) .Marland Kitchen.. 1 by mouth qd 4)  Synthroid 50 Mcg Tabs (Levothyroxine sodium) .Marland Kitchen.. 1 by mouth qd 5)  Urocit-k 10 1080 Mg Cr-tabs (Potassium citrate) .... 2 by mouth qam, 2 by mouth qpm 6)  Hydrochlorothiazide 25 Mg Tabs (Hydrochlorothiazide) .Marland Kitchen.. 1 by mouth qam 7)  Effexor Xr 150 Mg Xr24h-cap (Venlafaxine hcl) .Marland Kitchen.. 1 by mouth qam 8)  Abilify 20 Mg Tabs (Aripiprazole) .Marland Kitchen.. 1 by mouth at bedtime 9)  Lamotrigine 200 Mg Tabs (Lamotrigine) .Marland Kitchen.. 1 by mouth q noon 10)  Clonazepam 1 Mg Tabs (Clonazepam) .Marland Kitchen.. 1 by mouth at bedtime 11)  Seroquel Xr 150 Mg Xr24h-tab (Quetiapine fumarate) .Marland Kitchen.. 1 in am, 3 at bedtime 12)   Fexofenadine Hcl 180 Mg Tabs (Fexofenadine hcl) .Marland Kitchen.. 1 by mouth q am 13)  Mirtazapine 30 Mg Tabs (Mirtazapine) .Marland Kitchen.. 1 by mouth qhs 14)  Multivitamins Caps (Multiple vitamin) .Marland Kitchen.. 1 by mouth once daily 15)  Vitamin D-3 1000 Iu  .Marland Kitchen.. 1 by mouth at bedtime 16)  Humalog 100 Unit/ml Soln (Insulin lispro (human)) .... 3 times a day (qac) 12-0-14- units 17)  Topiramate 100 Mg Tabs (Topiramate) .Marland Kitchen.. 1 tab bid 18)  Novolin N 100 Unit/ml Susp (Insulin isophane human) .... 30 units qhs 19)  Onetouch Ultra Test Strp (Glucose blood) .Marland Kitchen.. 1 strip 4 times a day 20)  Aspirin 81 Mg Tabs (Aspirin) .... Take 1 by mouth qd 21)  Oxybutynin Chloride 10 Mg Xr24h-tab (Oxybutynin chloride) .Marland Kitchen.. 1 at pm 22)  Furosemide 40 Mg Tabs (Furosemide) .Marland Kitchen.. 1 by mouth qd 23)  Bd Insulin Syringe Ultrafine 31g X 5/16" 0.5 Ml Misc (Insulin syringe-needle u-100) .... Any brand, 4x a day 24)  Oxycodone-acetaminophen 5-325 Mg Tabs (Oxycodone-acetaminophen) .... Take 1 by mouth q 6 hours as needed 25)  Biotin 1000 Mcg Tabs (Biotin) .... Take 1 by mouth once daily 26)  Hydroxyzine Hcl 25 Mg Tabs (Hydroxyzine hcl) .Marland Kitchen.. 1 by mouth four times a day as needed for rash and itch 27)  Triamcinolone Acetonide 0.1 % Lotn (Triamcinolone acetonide) .... Apply to affected skin three times a day as needed  Patient Instructions: 1)  it was good to see you today. 2)  use medications and cream for your rash as discussed -your prescriptions have been electronically submitted to your pharmacy. Please take as directed. Contact our office if you believe you're having problems with the medication(s).  3)  if not improved, call for consideration of prednsione dose pack 4)  Please schedule a follow-up appointment as needed. Prescriptions: TRIAMCINOLONE ACETONIDE 0.1 % LOTN (TRIAMCINOLONE ACETONIDE) apply to affected skin three times a day as needed  #1 large x 1   Entered and Authorized by:   Newt Lukes MD   Signed by:   Newt Lukes MD on  10/10/2009   Method used:   Electronically to        [facility] [phone]* (retail)       [address] [place], [place]  [address]       Ph: [phone] or [phone]       Fax: [phone]   RxID:   [id number] HYDROXYZINE HCL 25 MG TABS (HYDROXYZINE HCL) 1 by mouth four times a day as needed for rash and itch  #60 x 1   Entered and Authorized by:   Newt Lukes MD   Signed by:   Newt Lukes MD on 10/10/2009   Method used:   [facility]ronically to        [facility] [phone]* (retail)       [address] [place], [place]  [address]       Ph: [phone] or [phone]       Fax: [phone]   RxID:   [id number]

## 2010-05-07 NOTE — Progress Notes (Signed)
Summary: lipitor  Phone Note Refill Request Message from:  Fax from Pharmacy on March 19, 2010 9:57 AM  Refills Requested: Medication #1:  LIPITOR 20 MG TABS 1 by mouth once daily [facility] [phone]  Initial call taken by: Orlan Leavens RMA,  March 19, 2010 9:58 AM  Follow-up for Phone Call        Faxed paper req back to [facility] [phone]. Updated EMR Follow-up by: Orlan Leavens RMA,  March 19, 2010 9:59 AM    Prescriptions: LIPITOR 20 MG TABS (ATORVASTATIN CALCIUM) 1 by mouth once daily  #90 x 1   Entered by:   Orlan Leavens RMA   Authorized by:   Newt Lukes MD   Signed by:   Orlan Leavens RMA on 03/19/2010   Method used:   Historical   RxID:   [id number]

## 2010-05-07 NOTE — Progress Notes (Signed)
Summary: rx refill req  Phone Note Refill Request Message from:  Fax from Pharmacy on March 18, 2010 11:11 AM  Refills Requested: Medication #1:  METFORMIN HCL 1000 MG TABS 1 by mouth QAM   Dosage confirmed as above?Dosage Confirmed  Method Requested: Fax to [facility] Next Appointment Scheduled: 03/23/2010 Initial call taken by: Brenton Grills CMA (AAMA),  March 18, 2010 11:11 AM    Prescriptions: METFORMIN HCL 1000 MG TABS (METFORMIN HCL) 1 by mouth QAM, 1 by mouth QPM  #180 x 2   Entered by:   Brenton Grills CMA (AAMA)   Authorized by:   Minus Breeding MD   Signed by:   Brenton Grills CMA (AAMA) on 03/18/2010   Method used:   Faxed to ...       [facility]       [address] [place], [place]  [address]       Ph: [phone]       Fax: [phone]   RxID:   [id number]

## 2010-05-07 NOTE — Assessment & Plan Note (Signed)
Summary: CONGESTION/NWS   Vital Signs:  Patient profile:   60 year old female Height:      63 inches (160.02 cm) Weight:      241 pounds (109.55 kg) O2 Sat:      98 % on Room air Temp:     97.4 degrees F (36.33 degrees C) oral Pulse rate:   91 / minute BP sitting:   130 / 80  (left arm) Cuff size:   large  Vitals Entered By: Orlan Leavens RMA (January 02, 2010 3:05 PM)  O2 Flow:  Room air CC: Congestion Is Patient Diabetic? Yes Did you bring your meter with you today? No Pain Assessment Patient in pain? no        Primary Care Provider:  Newt Lukes MD  CC:  Congestion.  History of Present Illness: c/o sinus pain and pressure- recurrent hx same - has recently seen ENT for sme onset 8 days ago precipitated byweather change a/w green discharge from nares -  also wheeze and productive yellow green cough no fever or vision change prior mild HA but now resolved  Clinical Review Panels:  CBC   WBC:  9.7 (10/29/2008)   RBC:  4.78 (10/29/2008)   Hgb:  13.1 (10/29/2008)   Hct:  39.6 (10/29/2008)   Platelets:  385.0 (10/29/2008)   MCV  82.9 (10/29/2008)   MCHC  33.0 (10/29/2008)   RDW  14.0 (10/29/2008)   PMN:  54.8 (10/29/2008)   Lymphs:  33.5 (10/29/2008)   Monos:  6.7 (10/29/2008)   Eosinophils:  4.1 (10/29/2008)   Basophil:  0.9 (10/29/2008)  Complete Metabolic Panel   Glucose:  190 (12/24/2008)   Sodium:  140 (12/24/2008)   Potassium:  3.7 (12/24/2008)   Chloride:  100 (12/24/2008)   CO2:  32 (12/24/2008)   BUN:  9 (12/24/2008)   Creatinine:  0.8 (12/24/2008)   Albumin:  3.5 (09/25/2008)   Total Protein:  6.8 (09/25/2008)   Calcium:  9.5 (12/24/2008)   Total Bili:  0.4 (09/25/2008)   Alk Phos:  128 (09/25/2008)   SGPT (ALT):  38 (09/25/2008)   SGOT (AST):  46 (09/25/2008)   Current Medications (verified): 1)  Metformin Hcl 1000 Mg Tabs (Metformin Hcl) .Marland Kitchen.. 1 By Mouth Qam, 1 By Mouth Qpm 2)  Lipitor 20 Mg Tabs (Atorvastatin Calcium) .Marland Kitchen.. 1  By Mouth Once Daily 3)  Premarin 0.3 Mg Tabs (Estrogens Conjugated) .Marland Kitchen.. 1 By Mouth Qd 4)  Synthroid 50 Mcg Tabs (Levothyroxine Sodium) .Marland Kitchen.. 1 By Mouth Qd 5)  Urocit-K 10 1080 Mg Cr-Tabs (Potassium Citrate) .... 2 By Mouth Qam, 2 By Mouth Qpm 6)  Hydrochlorothiazide 25 Mg Tabs (Hydrochlorothiazide) .Marland Kitchen.. 1 By Mouth Qam 7)  Effexor Xr 150 Mg Xr24h-Cap (Venlafaxine Hcl) .Marland Kitchen.. 1 By Mouth Qam 8)  Abilify 20 Mg Tabs (Aripiprazole) .Marland Kitchen.. 1 By Mouth At Bedtime 9)  Lamotrigine 200 Mg Tabs (Lamotrigine) .Marland Kitchen.. 1 By Mouth Q Noon 10)  Clonazepam 1 Mg Tabs (Clonazepam) .Marland Kitchen.. 1 By Mouth At Bedtime 11)  Seroquel Xr 150 Mg Xr24h-Tab (Quetiapine Fumarate) .Marland Kitchen.. 1 in Am, 3 At Bedtime 12)  Fexofenadine Hcl 180 Mg Tabs (Fexofenadine Hcl) .Marland Kitchen.. 1 By Mouth Q Am 13)  Mirtazapine 30 Mg Tabs (Mirtazapine) .Marland Kitchen.. 1 By Mouth Qhs 14)  Multivitamins  Caps (Multiple Vitamin) .Marland Kitchen.. 1 By Mouth Once Daily 15)  Vitamin D-3  1000 Iu .Marland Kitchen.. 1 By Mouth At Bedtime 16)  Humalog 100 Unit/ml Soln (Insulin Lispro (Human)) .... 3 Times A Day (Qac) 12-0-14-  Units 17)  Topiramate 100 Mg Tabs (Topiramate) .Marland Kitchen.. 1 Tab Bid 18)  Novolin N 100 Unit/ml Susp (Insulin Isophane Human) .... 30 Units Qhs 19)  Onetouch Ultra Test  Strp (Glucose Blood) .Marland Kitchen.. 1 Strip 4 Times A Day 20)  Aspirin 81 Mg Tabs (Aspirin) .... Take 1 By Mouth Qd 21)  Oxybutynin Chloride 10 Mg Xr24h-Tab (Oxybutynin Chloride) .Marland Kitchen.. 1 At Pm 22)  Furosemide 40 Mg Tabs (Furosemide) .Marland Kitchen.. 1 By Mouth Qd 23)  Bd Insulin Syringe Ultrafine 31g X 5/16" 0.5 Ml Misc (Insulin Syringe-Needle U-100) .... Any Brand, 4x A Day 24)  Biotin 1000 Mcg Tabs (Biotin) .... Take 1 By Mouth Once Daily 25)  Flonase 50 Mcg/act Susp (Fluticasone Propionate) .... 2 Sprays Each Nostril  Every Morning 26)  Nasal Rinse .... Use Once Once Daily  Allergies (verified): No Known Drug Allergies  Past History:  Past Medical History: IDDM ALLERGIC RHINITIS ASYMPTOMATIC POSTMENOPAUSAL STATUS  (ICD-V49.81) HYPERCHOLESTEROLEMIA  HYPERTENSION BIPOLAR DISORDER UNSPECIFIED  HYPOTHYROIDISM (ICD-244.9) GOITER  ?fibromyalga - tx by ortho/rheum      physician roster: Dr. Bryson Ha at [facility]. Dr. Lucienne Minks at [facility] orthopedics  Dr. Edison Pace Dr. Artist [place] Dr. Lavonia Drafts Dr. Everardo All - endo Dr. Dierdre Forth - rheum Dr. Jenne Pane - ENT  Review of Systems  The patient denies fever, chest pain, syncope, hemoptysis, and abdominal pain.    Physical Exam  General:  overweight-appearing.  alert, well-developed, well-nourished, and cooperative to examination.  nontoxic  Head:  Normocephalic and atraumatic without obvious abnormalities. No apparent alopecia or balding. Ears:  normal pinnae bilaterally, without erythema, swelling, or tenderness to palpation. TMs hazy but without erythema and without effusion, no cerumen impaction. Hearing grossly normal bilaterally  Mouth:  teeth and gums in fair repair; mucous membranes moist, without lesions or ulcers. oropharynx clear without exudate, no erythema. +shallow base apthous ulcer on left lateral side of tongue Lungs:  upper airway psuedowheeze audible but normal respiratory effort, no intercostal retractions or use of accessory muscles; normal breath sounds bilaterally - no crackles and no true wheezes.    Heart:  normal rate, regular rhythm, no murmur, and no rub. BLE without edema.    Impression & Recommendations:  Problem # 1:  SINUSITIS (ICD-473.9)  cont to follow ENT for same -  given sinus symptoms with progression into chest and assoc pseudowheze, use steroids for tx same  Her updated medication list for this problem includes:    Flonase 50 Mcg/act Susp (Fluticasone propionate) .Marland Kitchen... 2 sprays each nostril  every morning    Augmentin 875-125 Mg Tabs (Amoxicillin-pot clavulanate) .Marland Kitchen... 1 by mouth two times a day x 7 days  Take antibiotics for full duration. Discussed treatment options including indications for  coronal CT scan of sinuses and ENT referral.   Orders: Prescription Created Electronically [phone]) Depo- Medrol 80mg  (J1040) Depo- Medrol 40mg  (J1030) Admin of Therapeutic Inj  intramuscular or subcutaneous (84696)  Complete Medication List: 1)  Metformin Hcl 1000 Mg Tabs (Metformin hcl) .Marland Kitchen.. 1 by mouth qam, 1 by mouth qpm 2)  Lipitor 20 Mg Tabs (Atorvastatin calcium) .Marland Kitchen.. 1 by mouth once daily 3)  Premarin 0.3 Mg Tabs (Estrogens conjugated) .Marland Kitchen.. 1 by mouth qd 4)  Synthroid 50 Mcg Tabs (Levothyroxine sodium) .Marland Kitchen.. 1 by mouth qd 5)  Urocit-k 10 1080 Mg Cr-tabs (Potassium citrate) .... 2 by mouth qam, 2 by mouth qpm 6)  Hydrochlorothiazide 25 Mg Tabs (Hydrochlorothiazide) .Marland Kitchen.. 1 by mouth qam 7)  Effexor Xr 150 Mg Xr24h-cap (Venlafaxine hcl) .Marland Kitchen.. 1 by mouth  qam 8)  Abilify 20 Mg Tabs (Aripiprazole) .Marland Kitchen.. 1 by mouth at bedtime 9)  Lamotrigine 200 Mg Tabs (Lamotrigine) .Marland Kitchen.. 1 by mouth q noon 10)  Clonazepam 1 Mg Tabs (Clonazepam) .Marland Kitchen.. 1 by mouth at bedtime 11)  Seroquel Xr 150 Mg Xr24h-tab (Quetiapine fumarate) .Marland Kitchen.. 1 in am, 3 at bedtime 12)  Fexofenadine Hcl 180 Mg Tabs (Fexofenadine hcl) .Marland Kitchen.. 1 by mouth q am 13)  Mirtazapine 30 Mg Tabs (Mirtazapine) .Marland Kitchen.. 1 by mouth qhs 14)  Multivitamins Caps (Multiple vitamin) .Marland Kitchen.. 1 by mouth once daily 15)  Vitamin D-3 1000 Iu  .Marland Kitchen.. 1 by mouth at bedtime 16)  Humalog 100 Unit/ml Soln (Insulin lispro (human)) .... 3 times a day (qac) 12-0-14- units 17)  Topiramate 100 Mg Tabs (Topiramate) .Marland Kitchen.. 1 tab bid 18)  Novolin N 100 Unit/ml Susp (Insulin isophane human) .... 30 units qhs 19)  Onetouch Ultra Test Strp (Glucose blood) .Marland Kitchen.. 1 strip 4 times a day 20)  Aspirin 81 Mg Tabs (Aspirin) .... Take 1 by mouth qd 21)  Oxybutynin Chloride 10 Mg Xr24h-tab (Oxybutynin chloride) .Marland Kitchen.. 1 at pm 22)  Furosemide 40 Mg Tabs (Furosemide) .Marland Kitchen.. 1 by mouth qd 23)  Bd Insulin Syringe Ultrafine 31g X 5/16" 0.5 Ml Misc (Insulin syringe-needle u-100) .... Any brand, 4x a day 24)   Biotin 1000 Mcg Tabs (Biotin) .... Take 1 by mouth once daily 25)  Flonase 50 Mcg/act Susp (Fluticasone propionate) .... 2 sprays each nostril  every morning 26)  Nasal Rinse  .... Use once once daily 27)  Augmentin 875-125 Mg Tabs (Amoxicillin-pot clavulanate) .Marland Kitchen.. 1 by mouth two times a day x 7 days  Patient Instructions: 1)  it was good to see you today. 2)  antibiotics for your sinus symptoms and steroid shot for the wheezing as discussed; continue the nasal spray and wash -  3)  your prescription have been electronically submitted to your pharmacy. Please take as directed. Contact our office if you believe continue to follow with ENT as ongoing 4)  Please keep follow-up appointment as previously scheduled, call sooner if problems.  Prescriptions: AUGMENTIN 875-125 MG TABS (AMOXICILLIN-POT CLAVULANATE) 1 by mouth two times a day x 7 days  #14 x 0   Entered and Authorized by:   Newt Lukes MD   Signed by:   Newt Lukes MD on 01/02/2010   Method used:   [facility] [address] [phone][facility] (retail)       [address] [place], [place]  [address]       Ph: [phone] or [phone]       Fax: [phone]   RxID:   [id number]    Medication Administration  Injection # 1:    Medication: Depo- Medrol 80mg     Diagnosis: SINUSITIS (ICD-473.9)    Route: IM    Site: LUOQ gluteus    Exp Date: 07/2012    Lot #: [id number]    Mfr: [employer]    Comments: Gave totalof 120mg     Patient tolerated injection without complications    Given by: Orlan Leavens RMA (January 02, 2010 3:47 PM)  Injection # 2:    Medication: Depo- Medrol 40mg     Diagnosis: SINUSITIS (ICD-473.9)  Orders Added: 1)  Est. Patient Level IV [59563] 2)  Prescription Created Electronically [G8553] 3)  Depo- Medrol 80mg  [J1040] 4)  Depo- Medrol 40mg  [J1030] 5)  Admin of  Therapeutic Inj  intramuscular or subcutaneous [16109]

## 2010-05-07 NOTE — Assessment & Plan Note (Signed)
Summary: 2 WK ROV Brittany Acosta $50-PER PT RS--D/T  STC   Vital Signs:  Patient profile:   60 year old female Height:      63 inches (160.02 cm) Weight:      242.6 pounds (110.27 kg) O2 Sat:      97 % Temp:     98.2 degrees F (36.78 degrees C) oral Pulse rate:   95 / minute BP sitting:   130 / 78  (left arm) Cuff size:   large  Vitals Entered By: Orlan Leavens (October 29, 2008 10:09 AM) CC: 2 week follow-up/ Pt states is taking 20 units of novolin n Is Patient Diabetic? Yes  Pain Assessment Patient in pain? no        Primary Care Provider:  Newt Lukes MD  CC:  2 week follow-up/ Pt states is taking 20 units of novolin n.  History of Present Illness: still swollen R foot and trace to no L foot swelling skin bust open between toes last week because of swelling -  has improved with "saltwater soaks" at the beach last week no drainage no erythema or bruising no injury no pain in foot, toes or ankle ongoing swelling R>L foot now over 2mos  labs/work up reviewed: 09/03/08 -normal TSH 09/06/08 - normal DDimer, BNP, Cr, LFTs; neg venous US for DVT 09/25/08 - neg UA for protein, fatty liver changes on abd [place]  reviewed potential SE of all pych meds for ?swelling/edema -none obvious with effexor, abilify, lamictal, seroquel or topamax - no recent med or dose changes  Clinical Review Panels:  Complete Metabolic Panel   Glucose:  259 (09/25/2008)   Sodium:  137 (09/25/2008)   Potassium:  3.3 (09/25/2008)   Chloride:  96 (09/25/2008)   CO2:  31 (09/25/2008)   BUN:  10 (09/25/2008)   Creatinine:  0.8 (09/25/2008)   Albumin:  3.5 (09/25/2008)   Total Protein:  6.8 (09/25/2008)   Calcium:  9.3 (09/25/2008)   Total Bili:  0.4 (09/25/2008)   Alk Phos:  128 (09/25/2008)   SGPT (ALT):  38 (09/25/2008)   SGOT (AST):  46 (09/25/2008)   Current Medications (verified): 1)  Metformin Hcl 1000 Mg Tabs (Metformin Hcl) .Marland Kitchen.. 1 By Mouth Qam, 1 By Mouth Qpm 2)  Lipitor 20 Mg Tabs (Atorvastatin  Calcium) .Marland Kitchen.. 1 By Mouth Once Daily 3)  Premarin 0.3 Mg Tabs (Estrogens Conjugated) .Marland Kitchen.. 1 By Mouth Qd 4)  Synthroid 50 Mcg Tabs (Levothyroxine Sodium) .Marland Kitchen.. 1 By Mouth Qd 5)  Urocit-K 10 1080 Mg Cr-Tabs (Potassium Citrate) .... 2 By Mouth Qam, 2 By Mouth Qpm 6)  Hydrochlorothiazide 25 Mg Tabs (Hydrochlorothiazide) .Marland Kitchen.. 1 By Mouth Qam 7)  Effexor Xr 150 Mg Xr24h-Cap (Venlafaxine Hcl) .Marland Kitchen.. 1 By Mouth Qam 8)  Abilify 20 Mg Tabs (Aripiprazole) .Marland Kitchen.. 1 By Mouth At Bedtime 9)  Lamotrigine 200 Mg Tabs (Lamotrigine) .Marland Kitchen.. 1 By Mouth Q Noon 10)  Clonazepam 1 Mg Tabs (Clonazepam) .Marland Kitchen.. 1 By Mouth At Bedtime 11)  Seroquel Xr 150 Mg Xr24h-Tab (Quetiapine Fumarate) .Marland Kitchen.. 1 in Am, 3 At Bedtime 12)  Fexofenadine Hcl 180 Mg Tabs (Fexofenadine Hcl) .Marland Kitchen.. 1 By Mouth Q Am 13)  Mirtazapine 30 Mg Tabs (Mirtazapine) .Marland Kitchen.. 1 By Mouth Qhs 14)  Multivitamins  Caps (Multiple Vitamin) .Marland Kitchen.. 1 By Mouth Once Daily 15)  Vitamin D-3  1000 Iu .Marland Kitchen.. 1 By Mouth At Bedtime 16)  Humalog 100 Unit/ml Soln (Insulin Lispro (Human)) .... 4 Times A Day (Qac) 03-11-11- Units 17)  Topiramate 100 Mg Tabs (Topiramate) .Marland Kitchen.. 1 Tab Bid 18)  Novolin N 100 Unit/ml Susp (Insulin Isophane Human) .Marland Kitchen.. 15 Units Qhs 19)  Furosemide 40 Mg Tabs (Furosemide) .Marland Kitchen.. 1 By Mouth Qd 20)  Onetouch Ultra Test  Strp (Glucose Blood) .Marland Kitchen.. 1 Strip 4 Times A Day 21)  Aspirin 81 Mg Tabs (Aspirin) .... Take 1 By Mouth Qd 22)  Potassium Chloride 20 Meq Pack (Potassium Chloride) .Marland Kitchen.. 1 By Mouth Once Daily  Allergies (verified): No Known Drug Allergies PMH-FH-SH reviewed-no changes except otherwise noted  Review of Systems       see HPI above. I have reviewed all other systems and they were negative.   Physical Exam  General:  alert, well-developed, well-nourished, and cooperative to examination.    Eyes:  vision grossly intact; pupils equal, round and reactive to light.  conjunctiva and lids normal.    Lungs:  normal respiratory effort, no intercostal retractions or  use of accessory muscles; normal breath sounds bilaterally - no crackles and no wheezes.    Heart:  normal rate, regular rhythm, no murmur, and no rub. BLE without edema. (see Mskel exam) normal DP pulses and normal cap refill in all 4 extremities    Abdomen:  obese, soft, non-tender, normal bowel sounds, no distention; no masses and no appreciable hepatomegaly or splenomegaly.   Msk:  diffuse mild edema throughout R foot - nontender to palpation dorsal and plantar surfaces. FROM in toes and ankle w/o pain or limitations. no knee effusions B - L foot and ankle normal appearing Neurologic:  alert & oriented X3 and cranial nerves II-XII symetrically intact.  strength normal in all extremities, sensation intact to light touch, and gait normal. speech fluent without dysarthria or aphasia; follows commands with good comprehension.  Skin:  no bruising or ulceration or cellulitis changes over b feet. R foot with dry, cracked skin between great and 2nd toe w/o signs infection or drainage. Psych:  Oriented X3, memory intact for recent and remote, normally interactive, good eye contact, not anxious appearing, not depressed appearing, and not agitated.      Impression & Recommendations:  Problem # 1:  EDEMA (ICD-782.3) improved with diuretic tx but not resolved no evidence to date of cardiac, renal or hepatic dysfx causing fluid retention no DVT on prev [place] not on meds to cause poss SE cont same tx and continue w/u given DM, ?underlying infx or bony injury - check xray now check labs to r/o inf - CBC, ESR, CRP also check echo r/o diastolic dysfx Her updated medication list for this problem includes:    Hydrochlorothiazide 25 Mg Tabs (Hydrochlorothiazide) .Marland Kitchen... 1 by mouth qam    Furosemide 40 Mg Tabs (Furosemide) .Marland Kitchen... 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 1 by mouth qam, 1 by mouth qpm    Humalog 100 Unit/ml Soln (Insulin lispro (human)) .Marland KitchenMarland KitchenMarland KitchenMarland Kitchen 4 times a day (qac) 03-11-11- units    Novolin N 100 Unit/ml Susp (Insulin isophane human) .Marland KitchenMarland KitchenMarland KitchenMarland Kitchen  15 units qhs    Aspirin 81 Mg Tabs (Aspirin) .Marland Kitchen... Take 1 by mouth qd  Labs Reviewed: Creat: 0.8 (09/25/2008)     Last Eye Exam: normal (07/26/2008) Reviewed HgBA1c results: 8.4 (09/03/2008)  Problem # 5:  HYPOTHYROIDISM (ICD-244.9)  Her updated medication list for this problem includes:    Synthroid 50 Mcg Tabs (Levothyroxine sodium) .Marland Kitchen... 1 by mouth qd  Labs Reviewed: TSH: 1.05 (09/03/2008)    HgBA1c: 8.4 (09/03/2008) Chol: 115 (09/03/2008)   HDL: 53.50 (09/03/2008)   LDL: 42 (09/03/2008)   TG: 97.0 (09/03/2008)  Complete Medication List: 1)  Metformin Hcl 1000 Mg Tabs (Metformin hcl) .Marland Kitchen.. 1 by mouth qam, 1 by mouth qpm 2)  Lipitor 20 Mg Tabs (Atorvastatin calcium) .Marland Kitchen.. 1 by mouth once daily 3)  Premarin 0.3 Mg Tabs (Estrogens conjugated) .Marland Kitchen.. 1 by mouth qd 4)  Synthroid 50 Mcg Tabs (Levothyroxine sodium) .Marland Kitchen.. 1 by mouth qd 5)  Urocit-k 10 1080 Mg Cr-tabs (Potassium citrate) .... 2 by mouth qam, 2 by mouth qpm 6)  Hydrochlorothiazide 25 Mg Tabs (Hydrochlorothiazide) .Marland Kitchen.. 1 by mouth qam 7)  Effexor Xr 150 Mg Xr24h-cap (Venlafaxine hcl) .Marland Kitchen.. 1 by mouth qam 8)  Abilify 20 Mg Tabs (Aripiprazole) .Marland Kitchen.. 1 by mouth at bedtime 9)  Lamotrigine 200 Mg Tabs (Lamotrigine) .Marland Kitchen.. 1 by mouth q noon 10)  Clonazepam 1 Mg Tabs (Clonazepam) .Marland Kitchen.. 1 by mouth at bedtime 11)  Seroquel Xr  150 Mg Xr24h-tab (Quetiapine fumarate) .Marland Kitchen.. 1 in am, 3 at bedtime 12)  Fexofenadine Hcl 180 Mg Tabs (Fexofenadine hcl) .Marland Kitchen.. 1 by mouth q am 13)  Mirtazapine 30 Mg Tabs (Mirtazapine) .Marland Kitchen.. 1 by mouth qhs 14)  Multivitamins Caps (Multiple vitamin) .Marland Kitchen.. 1 by mouth once daily 15)  Vitamin D-3 1000 Iu  .Marland Kitchen.. 1 by mouth at bedtime 16)  Humalog 100 Unit/ml Soln (Insulin lispro (human)) .... 4 times a day (qac) 03-11-11- units 17)  Topiramate 100 Mg Tabs (Topiramate) .Marland Kitchen.. 1 tab bid 18)  Novolin N 100 Unit/ml Susp (Insulin isophane human) .Marland Kitchen.. 15 units qhs 19)  Furosemide 40 Mg Tabs (Furosemide) .Marland Kitchen.. 1 by mouth qd 20)  Onetouch Ultra Test Strp (Glucose blood) .Marland Kitchen.. 1 strip 4 times a day 21)  Aspirin 81 Mg Tabs (Aspirin) .... Take 1 by mouth qd 22)  Potassium Chloride 20 Meq Pack (Potassium chloride) .Marland Kitchen.. 1 by mouth once daily  Patient Instructions: 1)  we will arrange for further testing today to look for possible infection in your right foot - bloodwork and x-rays 2)  will also arrange for cardiac ultrasound to look for possible heart falure 3)  it does not appear that your medications are causing this problem - continue everything as you are doing 4)  followup here 2-3 months, sooner if problems  Appended Document: Orders Update    Clinical Lists Changes  Orders: Added new Test order of T- * Misc. Laboratory test [phone]) - Signed

## 2010-05-07 NOTE — Progress Notes (Signed)
Summary: rx- one touch  Phone Note Refill Request Message from:  Patient/[phone] on October 22, 2008 8:42 AM  Refills Requested: Medication #1:  ONETOUCH ULTRA TEST  STRP 1 strip 4 times a day send to pharmacy on file    Method Requested: Electronic Initial call taken by: Shelbie Proctor,  October 22, 2008 8:43 AM    Prescriptions: [place] TEST  STRP (GLUCOSE BLOOD) 1 strip 4 times a day  #120 x 3   Entered by:   Ami Bullins CMA   Authorized by:   Minus Breeding MD   Signed by:   Bill Salinas CMA on 10/22/2008   Method used:   [facility]ronically to        [facility] [address] (retail)       [address] [place], [place]  [address]       Ph: [phone] or [phone]       Fax: [phone]   RxID:   [id number]

## 2010-05-07 NOTE — Progress Notes (Signed)
Summary: CRITICAL LAB  Phone Note From Other Clinic   Summary of Call: CRITICAL LAB: Potassium 2.8, Dr Felicity Coyer is out of the office this pm, given to DR Jonny Ruiz  Initial call taken by: Lamar Sprinkles,  October 29, 2008 1:38 PM  Follow-up for Phone Call        call pt with low K - to take EXTRA potassium pills that she already has - take 3 extra pills today, and 2 extra pills tomorrow with repeat K and magnesium done on Thursday 276.8 Follow-up by: Corwin Levins MD,  October 29, 2008 2:15 PM  Additional Follow-up for Phone Call Additional follow up Details #1::        Called pt no ansew [facility] concerning labs Additional Follow-up by: Orlan Leavens,  October 29, 2008 2:23 PM    Additional Follow-up for Phone Call Additional follow up Details #2::    Called pt again still no ansew [facility] Follow-up by: Orlan Leavens,  October 29, 2008 4:15 PM  Additional Follow-up for Phone Call Additional follow up Details #3:: Details for Additional Follow-up Action Taken: Pt return called per dr Felicity Coyer was ok for pt to take 3 potassium regimen as dr Jonny Ruiz rx. will come in friday to have potassium check again. lab entered in [facility] Additional Follow-up by: Orlan Leavens,  October 30, 2008 9:09 AM

## 2010-05-07 NOTE — Progress Notes (Signed)
  Phone Note Call from Patient Call back at Home Phone [phone]   Caller: Patient Call For: Newt Lukes MD Summary of Call: Pt. c/o 2 day hx of somewhat "sharp"pain radiating left neck to shoulder.  No injury.  No assoc. numbness or weakness.  Pain worse with movement such as lateral bending neck.  Tylenol with no relief.  Denies chest pain. ?cervical nerve root impingement.  No red flags for concerning neurologic defecits.  Pt has reported f/u with primary next week.  She will present to Urgent Care of ER if pain intolerable or new sxs such as weakness between now and then. Initial call taken by: Evelena Peat MD,  December 20, 2008 8:54 PM

## 2010-05-07 NOTE — Miscellaneous (Signed)
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.8) - Signed Added new problem of TRANSAMINASES, SERUM, ELEVATED (ICD-790.4) - Signed Medications: Added new medication of POTASSIUM CHLORIDE 20 MEQ PACK (POTASSIUM CHLORIDE) 1 by mouth once daily - Signed Rx of POTASSIUM CHLORIDE 20 MEQ PACK (POTASSIUM CHLORIDE) 1 by mouth once daily;  #30 x 2;  Signed;  Entered by: Newt Lukes MD;  Authorized by: Newt Lukes MD;  Method used: [facility] to [facility] [phone]*, [address], [place], [place]  [address], Ph: [phone] or [phone], Fax: [phone] Orders: Added new Referral order of Radiology Referral (Radiology) - Signed Added new Service order of Prescription Created Electronically [phone]) - Signed    Prescriptions: POTASSIUM CHLORIDE 20 MEQ PACK (POTASSIUM CHLORIDE) 1 by mouth once daily  #30 x 2   Entered and Authorized by:   Newt Lukes MD   Signed by:   Newt Lukes MD on 09/26/2008   Method used:   Electronically to        [facility] [phone]* (retail)       [address] [place], [place]  [address]       Ph: [phone] or [phone]       Fax: [phone]   RxID:   [id number]

## 2010-05-07 NOTE — Progress Notes (Signed)
  Phone Note Other Incoming   Summary of Call: Called pt and informed her that [facility] [place] pt informed me that she is in [place], [place] Rx for one touch ultra test strips qty 120 with no refills Initial call taken by: Ami Bullins CMA,  September 18, 2008 9:12 AM  Follow-up for Phone Call        informed pt that One Touch ultra test strips were called in Follow-up by: Ami Bullins CMA,  September 18, 2008 9:31 AM

## 2010-05-07 NOTE — Progress Notes (Signed)
Summary: Rx req  Phone Note Call from Patient Call back at Home Phone [phone]   Caller: Patient Summary of Call: Pt called requesting Rx for ultrafine short pen needles and alcohol wipes for insulin injections. Pt informed Initial call taken by: Margaret Pyle, CMA,  March 26, 2010 1:44 PM    New/Updated Medications: BD PEN NEEDLE MINI U/F 31G X 5 MM MISC (INSULIN PEN NEEDLE) use as directed three times a day  Dx 250.01 ALCOHOL WIPES 70 % PADS (ALCOHOL SWABS) use as directed Prescriptions: ALCOHOL WIPES 70 % PADS (ALCOHOL SWABS) use as directed  #200 x 3   Entered by:   Margaret Pyle, CMA   Authorized by:   Minus Breeding MD   Signed by:   Margaret Pyle, CMA on 03/26/2010   Method used:   Electronically to        [facility] [phone]* (retail)       [address] [place], [place]  [address]       Ph: [phone] or [phone]       Fax: [phone]   RxID:   [id number] BD PEN NEEDLE MINI U/F 31G X 5 MM MISC (INSULIN PEN NEEDLE) use as directed three times a day  Dx 250.01  #100 x 3   Entered by:   Margaret Pyle, CMA   Authorized by:   Minus Breeding MD   Signed by:   Margaret Pyle, CMA on 03/26/2010   Method used:   Electronically to        [facility] [address]* (retail)       [address] [place], [place]  [address]       Ph: [phone] or [phone]       Fax: [phone]   RxID:   [id number]

## 2010-05-07 NOTE — Progress Notes (Signed)
Summary: furosemide  Phone Note From Pharmacy   Caller: [facility] [phone]* Summary of Call: Pt requesting a 90 day supply on her furosemide 40mg  Initial call taken by: Orlan Leavens,  November 13, 2008 3:00 PM  Follow-up for Phone Call        sent to pharm Follow-up by: Orlan Leavens,  November 13, 2008 3:01 PM    Prescriptions: FUROSEMIDE 40 MG TABS (FUROSEMIDE) 1 by mouth qd  #90 x 1   Entered by:   Orlan Leavens   Authorized by:   Newt Lukes MD   Signed by:   Orlan Leavens on 11/13/2008   Method used:   [facility]        [facility] [address] [phone]* (retail)       [address] [place], [place]  [address]       Ph: [phone] or [phone]       Fax: [phone]   RxID:   [id number]

## 2010-05-07 NOTE — Assessment & Plan Note (Signed)
Summary: PER PT D/T--FU--STC   Vital Signs:  Patient profile:   60 year old female Height:      63 inches (160.02 cm) Weight:      246.4 pounds (112 kg) BMI:     43.81 O2 Sat:      96 % on Room air Temp:     98.2 degrees F (36.78 degrees C) oral Pulse rate:   91 / minute BP sitting:   122 / 74  (left arm) Cuff size:   large  Vitals Entered By: Orlan Leavens (September 15, 2009 2:34 PM)  O2 Flow:  Room air CC: follow-up visit Is Patient Diabetic? Yes Did you bring your meter with you today? No Pain Assessment Patient in pain? no        Primary Care Provider:  Newt Lukes MD  CC:  follow-up visit.  History of Present Illness: here for f/u   1) prior injury to right knee precipitated by fall now s/p surg 08/13/09 no swelling or trouble walking -   2) DM2 - reports compliance with ongoing medical treatment and no changes in medication dose or frequency. denies adverse side effects related to current therapy.  home cbg log reviewed - no hypoglycemia events  3) edema - has completely resolved -  still using furosemide with potassium meds aw occ urinary incontinence - so takes at night and wears depends to avid daytime accidents  4) HTN - reports compliance with ongoing medical treatment and no changes in medication dose or frequency. denies adverse side effects related to current therapy.    Clinical Review Panels:  Prevention   Last Mammogram:  ASSESSMENT: Negative - BI-RADS 1^MM DIGITAL SCREENING (04/25/2009)   Last Colonoscopy:  DONE @ DR. Charna Elizabeth OFFICE Results: Normal. (09/03/2004)  Lipid Management   Cholesterol:  115 (09/03/2008)   LDL (bad choesterol):  42 (09/03/2008)   HDL (good cholesterol):  53.50 (09/03/2008)  Diabetes Management   HgBA1C:  7.8 (06/16/2009)   Creatinine:  0.8 (12/24/2008)   Last Dilated Eye Exam:  normal (07/26/2008)   Last Flu Vaccine:  Fluvax 3+ (01/13/2009)  CBC   WBC:  9.7 (10/29/2008)   RBC:  4.78 (10/29/2008)   Hgb:   13.1 (10/29/2008)   Hct:  39.6 (10/29/2008)   Platelets:  385.0 (10/29/2008)   MCV  82.9 (10/29/2008)   MCHC  33.0 (10/29/2008)   RDW  14.0 (10/29/2008)   PMN:  54.8 (10/29/2008)   Lymphs:  33.5 (10/29/2008)   Monos:  6.7 (10/29/2008)   Eosinophils:  4.1 (10/29/2008)   Basophil:  0.9 (10/29/2008)  Complete Metabolic Panel   Glucose:  190 (12/24/2008)   Sodium:  140 (12/24/2008)   Potassium:  3.7 (12/24/2008)   Chloride:  100 (12/24/2008)   CO2:  32 (12/24/2008)   BUN:  9 (12/24/2008)   Creatinine:  0.8 (12/24/2008)   Albumin:  3.5 (09/25/2008)   Total Protein:  6.8 (09/25/2008)   Calcium:  9.5 (12/24/2008)   Total Bili:  0.4 (09/25/2008)   Alk Phos:  128 (09/25/2008)   SGPT (ALT):  38 (09/25/2008)   SGOT (AST):  46 (09/25/2008)   Current Medications (verified): 1)  Metformin Hcl 1000 Mg Tabs (Metformin Hcl) .Marland Kitchen.. 1 By Mouth Qam, 1 By Mouth Qpm 2)  Lipitor 20 Mg Tabs (Atorvastatin Calcium) .Marland Kitchen.. 1 By Mouth Once Daily 3)  Premarin 0.3 Mg Tabs (Estrogens Conjugated) .Marland Kitchen.. 1 By Mouth Qd 4)  Synthroid 50 Mcg Tabs (Levothyroxine Sodium) .Marland Kitchen.. 1 By Mouth Qd  5)  Urocit-K 10 1080 Mg Cr-Tabs (Potassium Citrate) .... 2 By Mouth Qam, 2 By Mouth Qpm 6)  Hydrochlorothiazide 25 Mg Tabs (Hydrochlorothiazide) .Marland Kitchen.. 1 By Mouth Qam 7)  Effexor Xr 150 Mg Xr24h-Cap (Venlafaxine Hcl) .Marland Kitchen.. 1 By Mouth Qam 8)  Abilify 20 Mg Tabs (Aripiprazole) .Marland Kitchen.. 1 By Mouth At Bedtime 9)  Lamotrigine 200 Mg Tabs (Lamotrigine) .Marland Kitchen.. 1 By Mouth Q Noon 10)  Clonazepam 1 Mg Tabs (Clonazepam) .Marland Kitchen.. 1 By Mouth At Bedtime 11)  Seroquel Xr 150 Mg Xr24h-Tab (Quetiapine Fumarate) .Marland Kitchen.. 1 in Am, 3 At Bedtime 12)  Fexofenadine Hcl 180 Mg Tabs (Fexofenadine Hcl) .Marland Kitchen.. 1 By Mouth Q Am 13)  Mirtazapine 30 Mg Tabs (Mirtazapine) .Marland Kitchen.. 1 By Mouth Qhs 14)  Multivitamins  Caps (Multiple Vitamin) .Marland Kitchen.. 1 By Mouth Once Daily 15)  Vitamin D-3  1000 Iu .Marland Kitchen.. 1 By Mouth At Bedtime 16)  Humalog 100 Unit/ml Soln (Insulin Lispro (Human)) .... 3  Times A Day (Qac) 12-0-14- Units 17)  Topiramate 100 Mg Tabs (Topiramate) .Marland Kitchen.. 1 Tab Bid 18)  Novolin N 100 Unit/ml Susp (Insulin Isophane Human) .... 30 Units Qhs 19)  Onetouch Ultra Test  Strp (Glucose Blood) .Marland Kitchen.. 1 Strip 4 Times A Day 20)  Aspirin 81 Mg Tabs (Aspirin) .... Take 1 By Mouth Qd 21)  Allopurinol 100 Mg Tabs (Allopurinol) .... 2 A Day 22)  Oxybutynin Chloride 10 Mg Xr24h-Tab (Oxybutynin Chloride) .Marland Kitchen.. 1 At Pm 23)  Hydrocodone-Acetaminophen 5-500 Mg Tabs (Hydrocodone-Acetaminophen) .... Take 1-2 By Mouth Once Daily As Needed 24)  Alka-Seltzer Plus Cold 2-7.8-325 Mg Tbef (Chlorphen-Phenyleph-Asa) .... Use As Needed 25)  Furosemide 40 Mg Tabs (Furosemide) .Marland Kitchen.. 1 By Mouth Qd 26)  Meloxicam 15 Mg Tabs (Meloxicam) .... Take 1 By Mouth Once Daily 27)  Lidocaine Viscous 2 % Soln (Lidocaine Hcl) .... 5cc Bu Mouth Swish and Spit Four Times A Day As Needed For Mouth Pain 28)  Bd Insulin Syringe Ultrafine 31g X 5/16" 0.5 Ml Misc (Insulin Syringe-Needle U-100) .... Any Brand, 4x A Day 29)  Oxycodone-Acetaminophen 5-325 Mg Tabs (Oxycodone-Acetaminophen) .... Take 1 By Mouth Q 6 Hours As Needed 30)  Biotin 1000 Mcg Tabs (Biotin) .... 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 Bryson Ha at [facility]. Dr. Lucienne Minks at [facility] orthopedics  Dr. Edison Pace Dr. Artist Beach Dr. Lavonia Drafts Dr. Everardo All - endo  Past Surgical History: Cholecystectomy (1610) Hysterectomy (1976) Tubal ligation (1972) (L) Ovary removed 1980 (R) nasal surgery (08/1988) (R) sinus removed tooth partial (08/1989) Percitania stone removed )L) kidney (1992) Lithotripsy (L)  kidney (1997) (L) knee arthroscopic partial medical menisectomy  (L) Cystoscopy 1990 R knee surg 08/2009  Review of Systems  The patient denies chest pain, syncope, peripheral edema, and headaches.    Physical Exam  General:  alert, well-developed, well-nourished, and cooperative to examination.    Lungs:  normal respiratory effort, no intercostal retractions or use of accessory muscles; normal breath sounds bilaterally - no crackles and no wheezes.    Heart:  normal rate, regular rhythm, no murmur, and no rub. BLE with trace edema ankles Psych:  Oriented X3, memory intact for recent and remote, normally interactive, good eye contact, not anxious appearing, not depressed appearing, and not agitated.      Impression & Recommendations:  Problem # 1:  IDDM (  ICD-250.01)  Her updated medication list for this problem includes:    Metformin Hcl 1000 Mg Tabs (Metformin hcl) .Marland Kitchen... 1 by mouth qam, 1 by mouth qpm    Humalog 100 Unit/ml Soln (Insulin lispro (human)) .Marland KitchenMarland KitchenMarland KitchenMarland Kitchen 3 times a day (qac) 12-0-14- units    Novolin N 100 Unit/ml Susp (Insulin isophane human) .Marland KitchenMarland KitchenMarland KitchenMarland Kitchen 30 units qhs    Aspirin 81 Mg Tabs (Aspirin) .Marland Kitchen... Take 1 by mouth qd  Labs Reviewed: Creat: 0.8 (12/24/2008)     Last Eye Exam: normal (07/26/2008) Reviewed HgBA1c results: 7.8 (06/16/2009)  7.6 (03/24/2009)  Problem # 2:  HYPERTENSION (ICD-401.9)  Her updated medication list for this problem includes:    Hydrochlorothiazide 25 Mg Tabs (Hydrochlorothiazide) .Marland Kitchen... 1 by mouth qam    Furosemide 40 Mg Tabs (Furosemide) .Marland Kitchen... 1 by mouth qd  BP today: 122/74 Prior BP: 136/88 (06/16/2009)  Labs Reviewed: K+: 3.7 (12/24/2008) Creat: : 0.8 (12/24/2008)   Chol: 115 (09/03/2008)   HDL: 53.50 (09/03/2008)   LDL: 42 (09/03/2008)   TG: 97.0 (09/03/2008)  Problem # 3:  HYPERCHOLESTEROLEMIA (ICD-272.0)  Her updated medication list for this problem includes:    Lipitor 20 Mg Tabs (Atorvastatin calcium) .Marland Kitchen... 1 by mouth once  daily  Labs Reviewed: SGOT: 46 (09/25/2008)   SGPT: 38 (09/25/2008)   HDL:53.50 (09/03/2008)  LDL:42 (09/03/2008)  Chol:115 (09/03/2008)  Trig:97.0 (09/03/2008)  Problem # 4:  HYPOTHYROIDISM (ICD-244.9)  Her updated medication list for this problem includes:    Synthroid 50 Mcg Tabs (Levothyroxine sodium) .Marland Kitchen... 1 by mouth qd  Labs Reviewed: TSH: 1.05 (09/03/2008)    HgBA1c: 7.8 (06/16/2009) Chol: 115 (09/03/2008)   HDL: 53.50 (09/03/2008)   LDL: 42 (09/03/2008)   TG: 97.0 (09/03/2008)  Complete Medication List: 1)  Metformin Hcl 1000 Mg Tabs (Metformin hcl) .Marland Kitchen.. 1 by mouth qam, 1 by mouth qpm 2)  Lipitor 20 Mg Tabs (Atorvastatin calcium) .Marland Kitchen.. 1 by mouth once daily 3)  Premarin 0.3 Mg Tabs (Estrogens conjugated) .Marland Kitchen.. 1 by mouth qd 4)  Synthroid 50 Mcg Tabs (Levothyroxine sodium) .Marland Kitchen.. 1 by mouth qd 5)  Urocit-k 10 1080 Mg Cr-tabs (Potassium citrate) .... 2 by mouth qam, 2 by mouth qpm 6)  Hydrochlorothiazide 25 Mg Tabs (Hydrochlorothiazide) .Marland Kitchen.. 1 by mouth qam 7)  Effexor Xr 150 Mg Xr24h-cap (Venlafaxine hcl) .Marland Kitchen.. 1 by mouth qam 8)  Abilify 20 Mg Tabs (Aripiprazole) .Marland Kitchen.. 1 by mouth at bedtime 9)  Lamotrigine 200 Mg Tabs (Lamotrigine) .Marland Kitchen.. 1 by mouth q noon 10)  Clonazepam 1 Mg Tabs (Clonazepam) .Marland Kitchen.. 1 by mouth at bedtime 11)  Seroquel Xr 150 Mg Xr24h-tab (Quetiapine fumarate) .Marland Kitchen.. 1 in am, 3 at bedtime 12)  Fexofenadine Hcl 180 Mg Tabs (Fexofenadine hcl) .Marland Kitchen.. 1 by mouth q am 13)  Mirtazapine 30 Mg Tabs (Mirtazapine) .Marland Kitchen.. 1 by mouth qhs 14)  Multivitamins Caps (Multiple vitamin) .Marland Kitchen.. 1 by mouth once daily 15)  Vitamin D-3 1000 Iu  .Marland Kitchen.. 1 by mouth at bedtime 16)  Humalog 100 Unit/ml Soln (Insulin lispro (human)) .... 3 times a day (qac) 12-0-14- units 17)  Topiramate 100 Mg Tabs (Topiramate) .Marland Kitchen.. 1 tab bid 18)  Novolin N 100 Unit/ml Susp (Insulin isophane human) .... 30 units qhs 19)  Onetouch Ultra Test Strp (Glucose blood) .Marland Kitchen.. 1 strip 4 times a day 20)  Aspirin 81 Mg Tabs (Aspirin)  .... Take 1 by mouth qd 21)  Allopurinol 100 Mg Tabs (Allopurinol) .... 2 a day 22)  Oxybutynin Chloride 10 Mg Xr24h-tab (Oxybutynin chloride) .Marland KitchenMarland KitchenMarland Kitchen  1 at pm 23)  Hydrocodone-acetaminophen 5-500 Mg Tabs (Hydrocodone-acetaminophen) .... Take 1-2 by mouth once daily as needed 24)  Alka-seltzer Plus Cold 2-7.8-325 Mg Tbef (Chlorphen-phenyleph-asa) .... Use as needed 25)  Furosemide 40 Mg Tabs (Furosemide) .Marland Kitchen.. 1 by mouth qd 26)  Meloxicam 15 Mg Tabs (Meloxicam) .... Take 1 by mouth once daily 27)  Lidocaine Viscous 2 % Soln (Lidocaine hcl) .... 5cc bu mouth swish and spit four times a day as needed for mouth pain 28)  Bd Insulin Syringe Ultrafine 31g X 5/16" 0.5 Ml Misc (Insulin syringe-needle u-100) .... Any brand, 4x a day 29)  Oxycodone-acetaminophen 5-325 Mg Tabs (Oxycodone-acetaminophen) .... Take 1 by mouth q 6 hours as needed 30)  Biotin 1000 Mcg Tabs (Biotin) .... Take 1 by mouth once daily  Patient Instructions: 1)  it was good to see you today. 2)  labs and recent surgery reviewed - no labs needed today 3)  reschedule your appointment with dr. Everardo All as planned and call if you have problems before then - 4)  Please schedule a follow-up appointment in 3-6 months, sooner if problems.

## 2010-05-07 NOTE — Progress Notes (Signed)
Summary: rx-test strips  Phone Note Refill Request Message from:  Fax from Pharmacy on March 18, 2010 11:09 AM  Refills Requested: Medication #1:  ONETOUCH ULTRA TEST  STRP 1 strip 4 times a day   Dosage confirmed as above?Dosage Confirmed  Method Requested: Fax to [facility] Next Appointment Scheduled: 03/23/2010 Initial call taken by: Brenton Grills CMA (AAMA),  March 18, 2010 11:10 AM    Prescriptions: [place] TEST  STRP (GLUCOSE BLOOD) 1 strip 4 times a day  #360 x 3   Entered by:   Brenton Grills CMA (AAMA)   Authorized by:   Minus Breeding MD   Signed by:   Brenton Grills CMA (AAMA) on 03/18/2010   Method used:   Faxed to ...       [facility]       [address] [place], [place]  [address]       Ph: [phone]       Fax: [phone]   RxID:   [id number]

## 2010-05-07 NOTE — Assessment & Plan Note (Signed)
Summary: 2 WK FU   $50---STC   Vital Signs:  Patient profile:   60 year old female Height:      64 inches (162.56 cm) Weight:      248.6 pounds (113.00 kg) BMI:     42.83 O2 Sat:      94 % Temp:     97.2 degrees F (36.22 degrees C) oral Pulse rate:   99 / minute BP sitting:   134 / 80  (left arm) Cuff size:   large  Vitals Entered By: Orlan Leavens (June 24, 2008 1:25 PM)  Vision Screening:      Vision Comments: Last eye exam 07/07/07 @ [facility]   Referring Elliot Simoneaux:  Clancy Gourd Primary Vernie Piet:  Clancy Gourd   History of Present Illness: pt likes this new insulin regimen.  she brings a record of her cbg's which i have reviewed today.  it varies from 100's to 200's.  it is highest in am and before lunch.  she says it is high in am "because i have a habit of eating at bedtime."  Current Medications (verified): 1)  Metformin Hcl 1000 Mg Tabs (Metformin Hcl) .Marland Kitchen.. 1 By Mouth Qam, 1 By Mouth Qpm 2)  Lipitor 20 Mg Tabs (Atorvastatin Calcium) .Marland Kitchen.. 1 By Mouth Once Daily 3)  Premarin 0.3 Mg Tabs (Estrogens Conjugated) .Marland Kitchen.. 1 By Mouth Qd 4)  Synthroid 50 Mcg Tabs (Levothyroxine Sodium) .Marland Kitchen.. 1 By Mouth Qd 5)  Urocit-K 10 1080 Mg Cr-Tabs (Potassium Citrate) .... 2 By Mouth Qam, 2 By Mouth Qpm 6)  Hydrochlorothiazide 25 Mg Tabs (Hydrochlorothiazide) .Marland Kitchen.. 1 By Mouth Qam 7)  Topiramate 200 Mg Tabs (Topiramate) .Marland Kitchen.. 1 By Mouth Qd 8)  Effexor Xr 150 Mg Xr24h-Cap (Venlafaxine Hcl) .Marland Kitchen.. 1 By Mouth Qam 9)  Abilify 20 Mg Tabs (Aripiprazole) .Marland Kitchen.. 1 By Mouth At Bedtime 10)  Lamotrigine 200 Mg Tabs (Lamotrigine) .Marland Kitchen.. 1 By Mouth Q Noon 11)  Clonazepam 1 Mg Tabs (Clonazepam) .Marland Kitchen.. 1 By Mouth At Bedtime 12)  Seroquel Xr 150 Mg Xr24h-Tab (Quetiapine Fumarate) .Marland Kitchen.. 1 in Am, 3 At Bedtime 13)  Fexofenadine Hcl 180 Mg Tabs (Fexofenadine Hcl) .Marland Kitchen.. 1 By Mouth Q Am 14)  Nasonex 50 Mcg/act Susp (Mometasone Furoate) .... 2 Sprays Daily 15)  Mirtazapine 30 Mg Tabs (Mirtazapine) .Marland Kitchen..  1 By Mouth Qhs 16)  Multivitamins  Caps (Multiple Vitamin) .Marland Kitchen.. 1 By Mouth Once Daily 17)  Vitamin D-3  1000 Iu .Marland Kitchen.. 1 By Mouth At Bedtime 18)  Humalog 100 Unit/ml Soln (Insulin Lispro (Human)) .... Three Times A Day (Qac) 03-11-11 Units  Allergies (verified): No Known Drug Allergies  Review of Systems  The patient denies hypoglycemia.    Physical Exam  General:  obese.   Skin:  insulin injection sites at anterior abdomen are normal, except for a few ecchymoses.   Medications Added to Medication List This Visit: 1)  Urocit-k 10 1080 Mg Cr-tabs (Potassium citrate) .... 2 by mouth qam, 2 by mouth qpm 2)  Humalog 100 Unit/ml Soln (Insulin lispro (human)) .... 4 times a day (qac) 03-11-11-4 units  Other Orders: Est. Patient Level III ([address])  Patient Instructions: 1)  continue humalog (just before each meal) 03-11-11 units 2)  also, if you choose to eat a bedtime snack, take 4 units of humalog with it. 3)  Please schedule a follow-up appointment in 1 month. Prescriptions: METFORMIN HCL 1000 MG TABS (METFORMIN HCL) 1 by mouth QAM, 1 by mouth QPM  #180 x 3  Entered and Authorized by:   Minus Breeding MD   Signed by:   Minus Breeding MD on 06/24/2008   Method used:   [facility]ronically to        [facility] [employer]       [address] [place], [place]  [address]       Ph: [phone]       Fax: [phone]   RxID:   [id number]        Immunization History:  Influenza Immunization History:    Influenza:  historical (01/08/2008)

## 2010-05-07 NOTE — Assessment & Plan Note (Signed)
Summary: RS BUMP/DESK--STC   Vital Signs:  Patient profile:   60 year old female Height:      63 inches (160.02 cm) Weight:      238.50 pounds (108.41 kg) BMI:     42.40 O2 Sat:      96 % on Room air Temp:     98.3 degrees F (36.83 degrees C) oral Pulse rate:   92 / minute Pulse rhythm:   regular BP sitting:   132 / 84  (left arm) Cuff size:   large  Vitals Entered By: Brenton Grills MA (October 13, 2009 9:19 AM)  O2 Flow:  Room air CC: f/u appt/aj   Referring Provider:  Clancy Gourd Primary Provider:  Newt Lukes MD  CC:  f/u appt/aj.  History of Present Illness: pt states she feels well in general.  she brings a record of her cbg's which i have reviewed today.  it is lowest in the afternoon (as low as 60's), but it can be below 100 at any time of day  Current Medications (verified): 1)  Metformin Hcl 1000 Mg Tabs (Metformin Hcl) .Marland Kitchen.. 1 By Mouth Qam, 1 By Mouth Qpm 2)  Lipitor 20 Mg Tabs (Atorvastatin Calcium) .Marland Kitchen.. 1 By Mouth Once Daily 3)  Premarin 0.3 Mg Tabs (Estrogens Conjugated) .Marland Kitchen.. 1 By Mouth Qd 4)  Synthroid 50 Mcg Tabs (Levothyroxine Sodium) .Marland Kitchen.. 1 By Mouth Qd 5)  Urocit-K 10 1080 Mg Cr-Tabs (Potassium Citrate) .... 2 By Mouth Qam, 2 By Mouth Qpm 6)  Hydrochlorothiazide 25 Mg Tabs (Hydrochlorothiazide) .Marland Kitchen.. 1 By Mouth Qam 7)  Effexor Xr 150 Mg Xr24h-Cap (Venlafaxine Hcl) .Marland Kitchen.. 1 By Mouth Qam 8)  Abilify 20 Mg Tabs (Aripiprazole) .Marland Kitchen.. 1 By Mouth At Bedtime 9)  Lamotrigine 200 Mg Tabs (Lamotrigine) .Marland Kitchen.. 1 By Mouth Q Noon 10)  Clonazepam 1 Mg Tabs (Clonazepam) .Marland Kitchen.. 1 By Mouth At Bedtime 11)  Seroquel Xr 150 Mg Xr24h-Tab (Quetiapine Fumarate) .Marland Kitchen.. 1 in Am, 3 At Bedtime 12)  Fexofenadine Hcl 180 Mg Tabs (Fexofenadine Hcl) .Marland Kitchen.. 1 By Mouth Q Am 13)  Mirtazapine 30 Mg Tabs (Mirtazapine) .Marland Kitchen.. 1 By Mouth Qhs 14)  Multivitamins  Caps (Multiple Vitamin) .Marland Kitchen.. 1 By Mouth Once Daily 15)  Vitamin D-3  1000 Iu .Marland Kitchen.. 1 By Mouth At Bedtime 16)  Humalog 100 Unit/ml Soln  (Insulin Lispro (Human)) .... 3 Times A Day (Qac) 12-0-14- Units 17)  Topiramate 100 Mg Tabs (Topiramate) .Marland Kitchen.. 1 Tab Bid 18)  Novolin N 100 Unit/ml Susp (Insulin Isophane Human) .... 30 Units Qhs 19)  Onetouch Ultra Test  Strp (Glucose Blood) .Marland Kitchen.. 1 Strip 4 Times A Day 20)  Aspirin 81 Mg Tabs (Aspirin) .... Take 1 By Mouth Qd 21)  Oxybutynin Chloride 10 Mg Xr24h-Tab (Oxybutynin Chloride) .Marland Kitchen.. 1 At Pm 22)  Furosemide 40 Mg Tabs (Furosemide) .Marland Kitchen.. 1 By Mouth Qd 23)  Bd Insulin Syringe Ultrafine 31g X 5/16" 0.5 Ml Misc (Insulin Syringe-Needle U-100) .... Any Brand, 4x A Day 24)  Oxycodone-Acetaminophen 5-325 Mg Tabs (Oxycodone-Acetaminophen) .... Take 1 By Mouth Q 6 Hours As Needed 25)  Biotin 1000 Mcg Tabs (Biotin) .... Take 1 By Mouth Once Daily 26)  Hydroxyzine Hcl 25 Mg Tabs (Hydroxyzine Hcl) .Marland Kitchen.. 1 By Mouth Four Times A Day As Needed For Rash and Itch 27)  Triamcinolone Acetonide 0.1 % Lotn (Triamcinolone Acetonide) .... 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 Bryson Ha at [facility]. Dr. Lucienne Minks at [facility] orthopedics  Dr. Edison Pace Dr. Artist [place] Dr. Lavonia Drafts Dr. Everardo All - endo  Review of Systems  The patient denies syncope.    Physical Exam  General:  obese.   Pulses:  dorsalis pedis intact bilat.  Extremities:  no deformity.  no ulcer on the feet.  feet are of normal color and temp.  1+ right pedal edema and 1+ left pedal edema.  there is a heavy callous on the right foot mtp area Neurologic:  sensation is intact to touch on the feet, but slightly decreased from  normal.  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 4)  return 3 months. 5)  tests are being ordered for you today.  a few days after the test(s), please call [phone] to hear your test results. 6)  check your blood sugar 2 times a day.  vary the time of day when you check, between before the 3 meals, and at bedtime.  also check if you have symptoms of your blood sugar being too high or too low.  please keep a record of the readings and bring it to your next appointment here.  please call us sooner if you are having low blood sugar episodes.

## 2010-05-07 NOTE — Assessment & Plan Note (Signed)
Summary: R ANKLE SWELLING/CD   Vital Signs:  Patient profile:   60 year old female Height:      63 inches (160.02 cm) Weight:      249.0 pounds (113.18 kg) O2 Sat:      95 % on Room air Temp:     98.5 degrees F (36.94 degrees C) oral Pulse rate:   98 / minute BP sitting:   134 / 80  (left arm) Cuff size:   large  Vitals Entered By: Orlan Leavens (May 23, 2009 3:31 PM)  O2 Flow:  Room air CC: Swollen ankle, also complaining of chest congestion Is Patient Diabetic? Yes Did you bring your meter with you today? No Pain Assessment Patient in pain? no        Primary Care Provider:  Newt Lukes MD  CC:  Swollen ankle and also complaining of chest congestion.  History of Present Illness: here today with complaint of fluid in her ankles R>L . onset of symptoms was 1 week ago. course has been gradual onset and now occurs in constant daily pattern. problem precipitated by ?starting new medications for arthritis - mobic + vicodin denies injury or bruising symptom characterized as "puffiness" around both ankles problem associated with chest cold anddry cough but not associated with sputum,fever or CP  . symptoms improved by elevation. symptoms worsened with activity during the day. +prior hx of same symptoms.   Current Medications (verified): 1)  Metformin Hcl 1000 Mg Tabs (Metformin Hcl) .Marland Kitchen.. 1 By Mouth Qam, 1 By Mouth Qpm 2)  Lipitor 20 Mg Tabs (Atorvastatin Calcium) .Marland Kitchen.. 1 By Mouth Once Daily 3)  Premarin 0.3 Mg Tabs (Estrogens Conjugated) .Marland Kitchen.. 1 By Mouth Qd 4)  Synthroid 50 Mcg Tabs (Levothyroxine Sodium) .Marland Kitchen.. 1 By Mouth Qd 5)  Urocit-K 10 1080 Mg Cr-Tabs (Potassium Citrate) .... 2 By Mouth Qam, 2 By Mouth Qpm 6)  Hydrochlorothiazide 25 Mg Tabs (Hydrochlorothiazide) .Marland Kitchen.. 1 By Mouth Qam 7)  Effexor Xr 150 Mg Xr24h-Cap (Venlafaxine Hcl) .Marland Kitchen.. 1 By Mouth Qam 8)  Abilify 20 Mg Tabs (Aripiprazole) .Marland Kitchen.. 1 By Mouth At Bedtime 9)  Lamotrigine 200 Mg Tabs (Lamotrigine) .Marland Kitchen..  1 By Mouth Q Noon 10)  Clonazepam 1 Mg Tabs (Clonazepam) .Marland Kitchen.. 1 By Mouth At Bedtime 11)  Seroquel Xr 150 Mg Xr24h-Tab (Quetiapine Fumarate) .Marland Kitchen.. 1 in Am, 3 At Bedtime 12)  Fexofenadine Hcl 180 Mg Tabs (Fexofenadine Hcl) .Marland Kitchen.. 1 By Mouth Q Am 13)  Mirtazapine 30 Mg Tabs (Mirtazapine) .Marland Kitchen.. 1 By Mouth Qhs 14)  Multivitamins  Caps (Multiple Vitamin) .Marland Kitchen.. 1 By Mouth Once Daily 15)  Vitamin D-3  1000 Iu .Marland Kitchen.. 1 By Mouth At Bedtime 16)  Humalog 100 Unit/ml Soln (Insulin Lispro (Human)) .... 3 Times A Day (Qac) 03-06-13- Units 17)  Topiramate 100 Mg Tabs (Topiramate) .Marland Kitchen.. 1 Tab Bid 18)  Novolin N 100 Unit/ml Susp (Insulin Isophane Human) .... 25 Units Qhs 19)  Furosemide 40 Mg Tabs (Furosemide) .Marland Kitchen.. 1 By Mouth Qd 20)  Onetouch Ultra Test  Strp (Glucose Blood) .Marland Kitchen.. 1 Strip 4 Times A Day 21)  Aspirin 81 Mg Tabs (Aspirin) .... Take 1 By Mouth Qd 22)  Allopurinol 100 Mg Tabs (Allopurinol) .... 2 A Day 23)  Oxybutynin Chloride 10 Mg Xr24h-Tab (Oxybutynin Chloride) .Marland Kitchen.. 1 At Pm 24)  Meloxicam 15 Mg Tabs (Meloxicam) .... Take 1 By Mouth Qd 25)  Hydrocodone-Acetaminophen 5-500 Mg Tabs (Hydrocodone-Acetaminophen) .... Take 1-2 By Mouth Once Daily As Needed 26)  Alka-Seltzer Plus Cold  2-7.8-325 Mg Tbef (Chlorphen-Phenyleph-Asa) .... Use As Needed  Allergies (verified): No Known Drug Allergies  Past History:  Past Medical History: Reviewed history from 12/24/2008 and no changes required. IDDM (ICD-250.01) ALLERGIC RHINITIS (ICD-477.9) ASYMPTOMATIC POSTMENOPAUSAL STATUS (ICD-V49.81) HYPERCHOLESTEROLEMIA (ICD-272.0) HYPERTENSION (ICD-401.9) BIPOLAR DISORDER UNSPECIFIED (ICD-296.80) HYPOTHYROIDISM (ICD-244.9) GOITER  physician roster: Dr. Bryson Ha at [facility]. Dr. Lucienne Minks at [facility] orthopedics Dr. Edison Pace Dr. Artist [place] Dr. Lavonia Drafts Dr. Everardo All - endo  Review of Systems  The patient denies anorexia, fever, hoarseness, chest pain, syncope, dyspnea on exertion,  headaches, and difficulty walking.    Physical Exam  General:  alert, well-developed, well-nourished, and cooperative to examination.    Eyes:  vision grossly intact; pupils equal, round and reactive to light.  conjunctiva and lids normal.    Ears:  normal pinnae bilaterally, without erythema, swelling, or tenderness to palpation. TMs clear, without effusion, or cerumen impaction. Hearing grossly normal bilaterally  Mouth:  teeth and gums in good repair; mucous membranes moist, without lesions or ulcers. oropharynx clear without exudate, no erythema. +shallow base apthous ulcer on left lateral side of tongue Lungs:  normal respiratory effort, no intercostal retractions or use of accessory muscles; normal breath sounds bilaterally - no crackles and no wheezes.    Heart:  normal rate, regular rhythm, no murmur, and no rub. BLE with 1+ pitting edema ankles to mid calf   Impression & Recommendations:  Problem # 1:  EDEMA (ICD-782.3) ?exac by mobic use - began approx 3 weeks ago by ortho for arthritis - will holdsame for now - inc diuretic x 72h - cont elevation  Her updated medication list for this problem includes:    Hydrochlorothiazide 25 Mg Tabs (Hydrochlorothiazide) .Marland Kitchen... 1 by mouth qam    Furosemide 40 Mg Tabs (Furosemide) .Marland Kitchen... 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 1 by mouth q am    Aspirin 81 Mg Tabs (Aspirin) .Marland Kitchen... Take 1 by mouth qd    Alka-seltzer Plus Cold 2-7.8-325 Mg Tbef (Chlorphen-phenyleph-asa) ..... Use as needed    Meloxicam 15 Mg Tabs (Meloxicam) .Marland Kitchen... Take 1 by mouth once daily- hold for now  Instructed on symptomatic treatment. Call if symptoms persist or worsen.   Orders: Prescription Created  Electronically [phone])  Complete Medication List: 1)  Metformin Hcl 1000 Mg Tabs (Metformin hcl) .Marland Kitchen.. 1 by mouth qam, 1 by mouth qpm 2)  Lipitor 20 Mg Tabs (Atorvastatin calcium) .Marland Kitchen.. 1 by mouth once daily 3)  Premarin 0.3 Mg Tabs (Estrogens conjugated) .Marland Kitchen.. 1 by mouth qd 4)  Synthroid 50 Mcg Tabs (Levothyroxine sodium) .Marland Kitchen.. 1 by mouth qd 5)  Urocit-k 10 1080 Mg Cr-tabs (Potassium citrate) .... 2 by mouth qam, 2 by mouth qpm 6)  Hydrochlorothiazide 25 Mg Tabs (Hydrochlorothiazide) .Marland Kitchen.. 1 by mouth qam 7)  Effexor Xr 150 Mg Xr24h-cap (Venlafaxine hcl) .Marland Kitchen.. 1 by mouth qam 8)  Abilify 20 Mg Tabs (Aripiprazole) .Marland Kitchen.. 1 by mouth at bedtime 9)  Lamotrigine 200 Mg Tabs (Lamotrigine) .Marland Kitchen.. 1 by mouth q noon 10)  Clonazepam 1 Mg Tabs (Clonazepam) .Marland Kitchen.. 1 by mouth at bedtime 11)  Seroquel Xr 150 Mg Xr24h-tab (Quetiapine fumarate) .Marland Kitchen.. 1 in am, 3 at bedtime 12)  Fexofenadine Hcl 180 Mg Tabs (Fexofenadine hcl) .Marland KitchenMarland KitchenMarland Kitchen 1  by mouth q am 13)  Mirtazapine 30 Mg Tabs (Mirtazapine) .Marland Kitchen.. 1 by mouth qhs 14)  Multivitamins Caps (Multiple vitamin) .Marland Kitchen.. 1 by mouth once daily 15)  Vitamin D-3 1000 Iu  .Marland Kitchen.. 1 by mouth at bedtime 16)  Humalog 100 Unit/ml Soln (Insulin lispro (human)) .... 3 times a day (qac) 03-06-13- units 17)  Topiramate 100 Mg Tabs (Topiramate) .Marland Kitchen.. 1 tab bid 18)  Novolin N 100 Unit/ml Susp (Insulin isophane human) .... 25 units qhs 19)  Onetouch Ultra Test Strp (Glucose blood) .Marland Kitchen.. 1 strip 4 times a day 20)  Aspirin 81 Mg Tabs (Aspirin) .... Take 1 by mouth qd 21)  Allopurinol 100 Mg Tabs (Allopurinol) .... 2 a day 22)  Oxybutynin Chloride 10 Mg Xr24h-tab (Oxybutynin chloride) .Marland Kitchen.. 1 at pm 23)  Hydrocodone-acetaminophen 5-500 Mg Tabs (Hydrocodone-acetaminophen) .... Take 1-2 by mouth once daily as needed 24)  Alka-seltzer Plus Cold 2-7.8-325 Mg Tbef (Chlorphen-phenyleph-asa) .... Use as needed 25)  Furosemide 40 Mg Tabs (Furosemide) .Marland Kitchen.. 1 by mouth qd 26)  Meloxicam 15 Mg Tabs (Meloxicam) .... Take 1  by mouth once daily- hold for now 27)  Lidocaine Viscous 2 % Soln (Lidocaine hcl) .... 5cc bu mouth swish and spit four times a day as needed for mouth pain  Patient Instructions: 1)  it was good to see you today. 2)  hold meloxicam for now - may consider resuming at lower dose if needed for arthritis pain after swelling is improved 3)  take 2 furosemide (fluid pills) for next 3 days, then resume just 1 tab daily as before 4)  lidocaine gel for mouth and tongue pain - your prescription has been electronically submitted to your pharmacy. Please take as directed. Contact our office if you believe you're having problems with the medication(s).  5)  continue Alka seltzer and tylenol for cold symptoms - if you develop worseing symptoms or fever, call us and we can recosider antibiotics but it does not appear necessary to use any anitbiotic at this time  Prescriptions: LIDOCAINE VISCOUS 2 % SOLN (LIDOCAINE HCL) 5cc bu mouth swish and spit four times a day as needed for mouth pain  #100cc x 0   Entered and Authorized by:   Newt Lukes MD   Signed by:   Newt Lukes MD on 05/23/2009   Method used:   [facility]ronically to        [facility] [address] (retail[facility]       [address] [place], [place]  [address]       Ph: [phone] or [phone]       Fax: [phone]   RxID:   [id number]

## 2010-05-07 NOTE — Progress Notes (Signed)
Summary: Pharmacy  Phone Note Call from Patient Call back at Work Phone [phone]   Caller: Patient Summary of Call: pt called [facility] Initial call taken by: Margaret Pyle, CMA,  February 17, 2009 9:17 AM    Prescriptions: FUROSEMIDE 40 MG TABS (FUROSEMIDE) 1 by mouth qd  #90 x 2   Entered by:   Margaret Pyle, CMA   Authorized by:   Minus Breeding MD   Signed by:   Margaret Pyle, CMA on 02/17/2009   Method used:   Faxed to ...       [facility] [address] (mail-order)       [address] [place], [place]  [address]       Ph: [phone]       Fax: [phone]   RxID:   [id number]

## 2010-05-07 NOTE — Assessment & Plan Note (Signed)
Summary: 3 MTH FU  D/T---STC   Vital Signs:  Patient profile:   60 year old female Height:      63 inches (160.02 cm) Weight:      248.38 pounds (112.90 kg) O2 Sat:      97 % on Room air Temp:     97.3 degrees F (36.28 degrees C) oral Pulse rate:   101 / minute BP sitting:   136 / 88  (left arm) Cuff size:   large  Vitals Entered By: Josph Macho RMA (June 16, 2009 1:50 PM)  O2 Flow:  Room air CC: 3 month follow up/ pt states she is no longer taking the Alka Seltzer/ CF Is Patient Diabetic? No   Referring Provider:  Clancy Gourd Primary Provider:  Newt Lukes MD  CC:  3 month follow up/ pt states she is no longer taking the Alka Seltzer/ CF.  History of Present Illness: no cbg record, but states cbg's are sometimes low in the afternoon.  it is highest in am (higher than at hs despite no hs-snack).   Current Medications (verified): 1)  Metformin Hcl 1000 Mg Tabs (Metformin Hcl) .Marland Kitchen.. 1 By Mouth Qam, 1 By Mouth Qpm 2)  Lipitor 20 Mg Tabs (Atorvastatin Calcium) .Marland Kitchen.. 1 By Mouth Once Daily 3)  Premarin 0.3 Mg Tabs (Estrogens Conjugated) .Marland Kitchen.. 1 By Mouth Qd 4)  Synthroid 50 Mcg Tabs (Levothyroxine Sodium) .Marland Kitchen.. 1 By Mouth Qd 5)  Urocit-K 10 1080 Mg Cr-Tabs (Potassium Citrate) .... 2 By Mouth Qam, 2 By Mouth Qpm 6)  Hydrochlorothiazide 25 Mg Tabs (Hydrochlorothiazide) .Marland Kitchen.. 1 By Mouth Qam 7)  Effexor Xr 150 Mg Xr24h-Cap (Venlafaxine Hcl) .Marland Kitchen.. 1 By Mouth Qam 8)  Abilify 20 Mg Tabs (Aripiprazole) .Marland Kitchen.. 1 By Mouth At Bedtime 9)  Lamotrigine 200 Mg Tabs (Lamotrigine) .Marland Kitchen.. 1 By Mouth Q Noon 10)  Clonazepam 1 Mg Tabs (Clonazepam) .Marland Kitchen.. 1 By Mouth At Bedtime 11)  Seroquel Xr 150 Mg Xr24h-Tab (Quetiapine Fumarate) .Marland Kitchen.. 1 in Am, 3 At Bedtime 12)  Fexofenadine Hcl 180 Mg Tabs (Fexofenadine Hcl) .Marland Kitchen.. 1 By Mouth Q Am 13)  Mirtazapine 30 Mg Tabs (Mirtazapine) .Marland Kitchen.. 1 By Mouth Qhs 14)  Multivitamins  Caps (Multiple Vitamin) .Marland Kitchen.. 1 By Mouth Once Daily 15)  Vitamin D-3  1000 Iu .Marland Kitchen.. 1  By Mouth At Bedtime 16)  Humalog 100 Unit/ml Soln (Insulin Lispro (Human)) .... 3 Times A Day (Qac) 03-06-13- Units 17)  Topiramate 100 Mg Tabs (Topiramate) .Marland Kitchen.. 1 Tab Bid 18)  Novolin N 100 Unit/ml Susp (Insulin Isophane Human) .... 25 Units Qhs 19)  Onetouch Ultra Test  Strp (Glucose Blood) .Marland Kitchen.. 1 Strip 4 Times A Day 20)  Aspirin 81 Mg Tabs (Aspirin) .... Take 1 By Mouth Qd 21)  Allopurinol 100 Mg Tabs (Allopurinol) .... 2 A Day 22)  Oxybutynin Chloride 10 Mg Xr24h-Tab (Oxybutynin Chloride) .Marland Kitchen.. 1 At Pm 23)  Hydrocodone-Acetaminophen 5-500 Mg Tabs (Hydrocodone-Acetaminophen) .... Take 1-2 By Mouth Once Daily As Needed 24)  Alka-Seltzer Plus Cold 2-7.8-325 Mg Tbef (Chlorphen-Phenyleph-Asa) .... Use As Needed 25)  Furosemide 40 Mg Tabs (Furosemide) .Marland Kitchen.. 1 By Mouth Qd 26)  Meloxicam 15 Mg Tabs (Meloxicam) .... Take 1 By Mouth Once Daily- Hold For Now 27)  Lidocaine Viscous 2 % Soln (Lidocaine Hcl) .... 5cc Bu Mouth Swish and Spit Four Times A Day As Needed For Mouth Pain  Allergies (verified): No Known Drug Allergies  Past History:  Past Medical History: Last updated: 12/24/2008 IDDM (ICD-250.01) ALLERGIC RHINITIS (  ICD-477.9) ASYMPTOMATIC POSTMENOPAUSAL STATUS (ICD-V49.81) HYPERCHOLESTEROLEMIA (ICD-272.0) HYPERTENSION (ICD-401.9) BIPOLAR DISORDER UNSPECIFIED (ICD-296.80) HYPOTHYROIDISM (ICD-244.9) GOITER  physician roster: Dr. Bryson Ha at [facility]. Dr. Lucienne Minks at [facility] orthopedics Dr. Edison Pace Dr. Artist [place] Dr. Lavonia Drafts Dr. Everardo All - endo  Review of Systems  The patient denies syncope.    Physical Exam  General:  morbidly obese.  no distress  Neck:  thyroid is approx 3x normal size, left > right.  i cannot appreciate a  nodule. Additional Exam:   Hemoglobin A1C       [H]  7.8 %    Impression & Recommendations:  Problem # 1:  IDDM (ICD-250.01) needs increased rx  Problem # 2:  GOITER, MULTINODULAR (ICD-241.1)  Medications Added to  Medication List This Visit: 1)  Humalog 100 Unit/ml Soln (Insulin lispro (human)) .... 3 times a day (qac) 12-0-14- units 2)  Novolin N 100 Unit/ml Susp (Insulin isophane human) .... 30 units qhs 3)  Bd Pen Needle Short U/f 31g X 8 Mm Misc (Insulin pen needle) .... Three times a day  Other Orders: Radiology Referral (Radiology) TLB-A1C / Hgb A1C (Glycohemoglobin) (83036-A1C) Est. Patient Level III (52841)  Patient Instructions: 1)  pending the results of today's blood tests: 2)  increase nph to 30 units at night 3)  reduce humalog to (just before each meal) 12-0-14 units. 4)  return 3 months. 5)  recheck thyroid ultrasound. 6)  tests are being ordered for you today.  a few days after the test(s), please call [phone] to hear your test results. 7)  check your blood sugar 2 times a day.  vary the time of day when you check, between before the 3 meals, and at bedtime.  also check if you have symptoms of your blood sugar being too high or too low.  please keep a record of the readings and bring it to your next appointment here.  please call us sooner if you are having low blood sugar episodes. 8)  (update: i left message on phone-tree:  rx as we discussed) Prescriptions: BD PEN NEEDLE SHORT U/F 31G X 8 MM MISC (INSULIN PEN NEEDLE) three times a day  #300 x 3   Entered and Authorized by:   Minus Breeding MD   Signed by:   Minus Breeding MD on 06/16/2009   Method used:   Faxed to ...       [facility]       [address] [place], [place]  [address]       Ph: [phone]       Fax: [phone]   RxID:   [id number]

## 2010-05-07 NOTE — Progress Notes (Signed)
  Phone Note Call from Patient   Caller: Patient Reason for Call: Talk to Nurse Summary of Call: Recieved a call from office. want to know who called her. pt see Dr. Felicity Coyer & Dr. Everardo All Initial call taken by: Orlan Leavens,  December 02, 2008 4:26 PM  Follow-up for Phone Call        I  Brittany Acosta) did not call pt today. Ami did you call Brittany Acosta today Follow-up by: Orlan Leavens,  December 02, 2008 4:26 PM  Additional Follow-up for Phone Call Additional follow up Details #1::        I did not call pt Additional Follow-up by: Ami Bullins CMA,  December 04, 2008 4:04 PM

## 2010-05-07 NOTE — Progress Notes (Signed)
  Phone Note Call from Patient Call back at Home Phone [phone]   Caller: Patient Call For: Everardo All Summary of Call: Pt would like log books to keep track of glucose, can you mail them to here? [address] [place], [place] [address] Initial call taken by: Verdell Face,  June 11, 2008 1:22 PM  Follow-up for Phone Call        please write on a paper for now.  please let me know when you return, and i'll see if we have any Follow-up by: Minus Breeding MD,  June 11, 2008 4:00 PM  Additional Follow-up for Phone Call Additional follow up Details #1::        called pt to inform left msg on machine  Additional Follow-up by: Shelbie Proctor,  June 11, 2008 4:39 PM

## 2010-05-07 NOTE — Assessment & Plan Note (Signed)
Summary: NEW MEDICARE/BCBS-$50-PKG/OFF-DIABETES-PER [facility]HA/DR [facility]   Vital Signs:  Patient Profile:   60 Years Old Female Weight:      249.8 pounds O2 Sat:      97 % O2 treatment:    Room Air Temp:     98.0 degrees F oral Pulse rate:   92 / minute BP sitting:   110 / 80  (left arm) Cuff size:   large  Pt. in pain?   no  Vitals Entered By: Iona Hansen CMA (June 10, 2008 2:43 PM)                  Vital Signs:  Patient profile:   60 Years Old Female Weight:      249.8 pounds O2 Sat:      97 % Temp:     98.0 degrees F oral Pulse rate:   92 / minute BP sitting:   110 / 80 Cuff size:   large   Visit Type:  NEW ENDO Referred by:  Clancy Gourd PCP:  Clancy Gourd  Chief Complaint:  NEW ENDO.  History of Present Illness: pt states dm x approx 10 years.  she is unaware of any chronic complications.  she has taken insulin since soon after dx.  she takes humulin 70/30, at a varying dosage (total approx 35 units/day).  she says her cbg's vary greatly.  it is often over 300.   she has few years of moderate weight gain, bu no associated numbness of the feet. she says her diet is "fair," and exercise is poor.    Prior Medications Reviewed Using: List Brought by Patient  Updated Prior Medication List: METFORMIN HCL 1000 MG TABS (METFORMIN HCL) 1 by mouth QAM, 1 by mouth QPM HUMULIN 70/30 70-30 % SUSP (INSULIN ISOPHANE & REGULAR) SLIDING SCALE LIPITOR 20 MG TABS (ATORVASTATIN CALCIUM) 1 by mouth once daily PREMARIN 0.3 MG TABS (ESTROGENS CONJUGATED) 1 by mouth QD SYNTHROID 50 MCG TABS (LEVOTHYROXINE SODIUM) 1 by mouth QD UROCIT-K 10 1080 MG CR-TABS (POTASSIUM CITRATE) 1 by mouth QAM, 2 by mouth QPM HYDROCHLOROTHIAZIDE 25 MG TABS (HYDROCHLOROTHIAZIDE) 1 by mouth QAM TOPIRAMATE 200 MG TABS (TOPIRAMATE) 1 by mouth QD EFFEXOR XR 150 MG XR24H-CAP (VENLAFAXINE HCL) 1 by mouth QAM ABILIFY 20 MG TABS (ARIPIPRAZOLE) 1 by mouth at bedtime LAMOTRIGINE 200 MG TABS  (LAMOTRIGINE) 1 by mouth Q NOON CLONAZEPAM 1 MG TABS (CLONAZEPAM) 1 by mouth at bedtime SEROQUEL XR 150 MG XR24H-TAB (QUETIAPINE FUMARATE) 1 IN AM, 3 at bedtime FEXOFENADINE HCL 180 MG TABS (FEXOFENADINE HCL) 1 by mouth Q AM NASONEX 50 MCG/ACT SUSP (MOMETASONE FUROATE) 2 SPRAYS DAILY MIRTAZAPINE 30 MG TABS (MIRTAZAPINE) 1 by mouth QHS MULTIVITAMINS  CAPS (MULTIPLE VITAMIN) 1 by mouth once daily * VITAMIN D-3  1000 IU 1 by mouth at bedtime  Current Allergies (reviewed today): No known allergies   Past Medical History:    Reviewed history and no changes required:       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         denies chest pain, n/v, cramps, excessive diaphoresis, memory loss, hypoglycemia, bruising.  she has frequent urination     Physical Exam  General:     obese.   Head:     head: no deformity eyes: no periorbital swelling, no proptosis external nose and ears are normal mouth: no lesion seen  Neck:     thyroid is slightly enlarged with irregular surface, but i do not appreciate a discrete nodule. Lungs:     clear bilaterally to A  Heart:     regular rate and rhythm, S1, S2 without murmurs, rubs, gallops, or clicks Abdomen:     abdomen is soft, nontender.  no hepatosplenomegaly.   not distended.  no hernia  Msk:     no deformity or scoliosis noted with normal posture and gait Pulses:     dorsalis pedis intact bilat.  no carotid  bruit  Extremities:     no deformity.  no ulcer on the feet.  feet are of normal color and temp.   trace left pedal edema and trace right pedal edema.   Neurologic:     cn 2-12 grossly intact.   readily moves all 4's.   sensation is intact to touch on the feet  Skin:     normal texture and temp.  no rash.  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 1 by mouth qam, 1 by mouth qpm 2)  Humulin 70/30 70-30 % Susp (Insulin isophane & regular) .... Sliding scale 3)  Lipitor 20 Mg Tabs (Atorvastatin calcium) .Marland Kitchen.. 1 by mouth once daily 4)  Premarin 0.3 Mg Tabs (Estrogens conjugated) .Marland Kitchen.. 1 by mouth qd 5)  Synthroid 50 Mcg Tabs (Levothyroxine sodium) .Marland Kitchen.. 1 by mouth qd 6)  Urocit-k 10 1080 Mg Cr-tabs (Potassium citrate) .Marland Kitchen.. 1 by mouth qam, 2 by mouth qpm 7)  Hydrochlorothiazide 25 Mg Tabs (Hydrochlorothiazide) .Marland Kitchen.. 1 by mouth qam 8)  Topiramate 200 Mg Tabs (Topiramate) .Marland Kitchen.. 1 by mouth qd 9)  Effexor Xr 150 Mg Xr24h-cap (Venlafaxine hcl) .Marland Kitchen.. 1 by mouth qam 10)  Abilify 20 Mg Tabs (Aripiprazole) .Marland Kitchen.. 1 by mouth at bedtime 11)  Lamotrigine 200 Mg Tabs (Lamotrigine) .Marland Kitchen.. 1 by mouth q noon 12)  Clonazepam 1 Mg Tabs (Clonazepam) .Marland Kitchen.. 1 by mouth at bedtime 13)  Seroquel Xr 150 Mg Xr24h-tab (Quetiapine fumarate) .Marland Kitchen.. 1 in am, 3 at bedtime 14)  Fexofenadine Hcl 180 Mg Tabs (Fexofenadine hcl) .Marland Kitchen.. 1 by mouth q am 15)  Nasonex 50 Mcg/act Susp (Mometasone furoate) .... 2 sprays daily 16)  Mirtazapine 30 Mg Tabs  (Mirtazapine) .Marland Kitchen.. 1 by mouth qhs 17)  Multivitamins Caps (Multiple vitamin) .Marland Kitchen.. 1 by mouth once daily 18)  Vitamin D-3 1000  Iu  ..Marland Kitchen. 1 by mouth at bedtime 19)  Humalog 100 Unit/ml Soln (Insulin lispro (human)) .... Three times a day (qac) 03-11-11 units   Patient Instructions: 1)  we discussed the importance of diet and exercise therapy and the risk of diabetes 2)  i told pt we will need to take this complex situation in stages 3)  check your blood glucose 2 times a day.  vary the time of day between before the 3 meals and at bedtime.  also check if you feel as though your glucose might be very high or too low.  bring a record of this to your doctor appointments. 4)  change current insulin to humalog three times a day (just before each meal) 03-11-11 units 5)  Please schedule a follow-up appointment in 2 weeks. 6)  continue aggressive rx of other risk-factors, per dr Raquel James

## 2010-05-07 NOTE — Progress Notes (Signed)
Summary: 2-D echo  Phone Note Outgoing Call   Call placed by: Orlan Leavens,  November 04, 2008 12:37 PM Call placed to: Patient Summary of Call: Called to inform her about labs that she had done on Friday 11/01/08/ Pt was'nt @ home left msg on machine to RTC/LMB Initial call taken by: Orlan Leavens,  November 04, 2008 12:39 PM  Follow-up for Phone Call        Called pt again no ansew [facility] RTC concerning labs Follow-up by: Orlan Leavens,  November 04, 2008 3:41 PM  Additional Follow-up for Phone Call Additional follow up Details #1::        Called pt no ansew [facility] RTC concerning labs Additional Follow-up by: Orlan Leavens,  November 05, 2008 8:41 AM    Additional Follow-up for Phone Call Additional follow up Details #2::    Pt return my call gave results concerning potassioum. also pt states that she had 2-d echo doen yesterday. req results Follow-up by: Orlan Leavens,  November 05, 2008 9:52 AM  Additional Follow-up for Phone Call Additional follow up Details #3:: Details for Additional Follow-up Action Taken: very mild diastolic heart failure - appropriate treatment is to continue the fluid pills for swelling as we are doing - will need recheck Bmet in 2 weeks to watch potassium and kidney function. thanks.  Rene Paci, MD 10:41 AM 11/05/08   Notified pt with results, also put future lab order in IDX, mailed copy of report 2 pt for her records/LMB Additional Follow-up by: Orlan Leavens,  November 05, 2008 11:52 AM

## 2010-05-07 NOTE — Progress Notes (Signed)
Summary: premarin & synthroid  Phone Note Refill Request Message from:  Fax from Pharmacy on March 18, 2010 9:18 AM  Refills Requested: Medication #1:  PREMARIN 0.3 MG TABS 1 by mouth QD  Medication #2:  SYNTHROID 50 MCG TABS 1 by mouth QD [facility] [phone]   Method Requested: Fax to Mail Away Pharmacy Initial call taken by: Orlan Leavens RMA,  March 18, 2010 9:18 AM  Follow-up for Phone Call        Faxed paper request back to [facility] [phone]. Updated EMR Follow-up by: Orlan Leavens RMA,  March 18, 2010 9:19 AM    Prescriptions: SYNTHROID 50 MCG TABS (LEVOTHYROXINE SODIUM) 1 by mouth QD  #90 x 1   Entered by:   Orlan Leavens RMA   Authorized by:   Newt Lukes MD   Signed by:   Orlan Leavens RMA on 03/18/2010   Method used:   Historical   RxID:   [id number] PREMARIN 0.3 MG TABS (ESTROGENS CONJUGATED) 1 by mouth QD  #90 x 1   Entered by:   Orlan Leavens RMA   Authorized by:   Newt Lukes MD   Signed by:   Orlan Leavens RMA on 03/18/2010   Method used:   Historical   RxID:   [id number]

## 2010-05-07 NOTE — Progress Notes (Signed)
Summary: Insulin  Phone Note Call from Patient Call back at Work Phone [phone]   Caller: Patient Summary of Call: pt called stating that she doesnot use pen needles, she says she uses regular needles and vials of Insulin. Pt is requesting new Rx to [facility]. Initial call taken by: Margaret Pyle, CMA,  July 10, 2009 9:26 AM  Follow-up for Phone Call        sent Follow-up by: Minus Breeding MD,  July 10, 2009 11:59 AM  Additional Follow-up for Phone Call Additional follow up Details #1::        pt informed, states that she had to pay $60+ for the first Rx that she does not use. pt is requesting MD call her back. please advise Additional Follow-up by: Margaret Pyle, CMA,  July 10, 2009 1:15 PM    Additional Follow-up for Phone Call Additional follow up Details #2::    please call patient: hold on to the pen needles.  i'll give you some free sample insulin pens when you come in for appointments, so you can use up the pen needles.  Follow-up by: Minus Breeding MD,  July 10, 2009 2:11 PM  Additional Follow-up for Phone Call Additional follow up Details #3:: Details for Additional Follow-up Action Taken: pt agreed Additional Follow-up by: Margaret Pyle, CMA,  July 10, 2009 2:14 PM  New/Updated Medications: BD INSULIN SYRINGE ULTRAFINE 31G X 5/16" 0.5 ML MISC (INSULIN SYRINGE-NEEDLE U-100) any brand, 4x a day Prescriptions: BD INSULIN SYRINGE ULTRAFINE 31G X 5/16" 0.5 ML MISC (INSULIN SYRINGE-NEEDLE U-100) any brand, 4x a day  #360 x 3   Entered and Authorized by:   Minus Breeding MD   Signed by:   Minus Breeding MD on 07/10/2009   Method used:   Electronically to        [facility] ([facility])       [address]       [place], [place]  [address]       Ph: [phone]       Fax: [phone]   RxID:   [id number]

## 2010-05-07 NOTE — Progress Notes (Signed)
Summary: referral  Phone Note Call from Patient Call back at Work Phone [phone]   Caller: Patient Summary of Call: Pt called requesting referral to an ENT specialist. Pt says she has been have persistant sinus drainage  Initial call taken by: Margaret Pyle, CMA,  November 27, 2009 9:03 AM  Follow-up for Phone Call        Pt has appt scheduled to day with VAL Follow-up by: Margaret Pyle, CMA,  November 27, 2009 10:29 AM

## 2010-05-07 NOTE — Letter (Signed)
Summary: Results Follow-up Letter  [facility]  [address] [place], [place] [address]   Phone: [phone]  Fax: [phone]    09/30/2008  [address] [place], [place]  [address]  Dear Ms. Harshman,   The following are the results of your recent test(s 09/30/08  Test     Result     U/S Abdomen       Fatty liver changes, likely explaination for abnormal lever functions on your labwork. No specific treatment except to continue effort at weight loss. No other explaination for feet swelling. attached a copy of your U/S report for your records   Sincerely,  Lucy Brand,RMA/Dr. Rene Paci [facility]

## 2010-05-07 NOTE — Progress Notes (Signed)
Summary: Lab results  Phone Note Call from Patient Call back at Home Phone [phone] Call back at Work Phone [phone]   Caller: Patient Summary of Call: Pt called stating results of recent labs with VAL are not available on PT. I have verified this. Pt is requesting results and also states she is still having swellingof her ankles. Initial call taken by: Margaret Pyle, CMA,  March 31, 2010 11:33 AM  Follow-up for Phone Call        her labs are all fine - normal liver, kindeys and chol under good control - no explaination on labs for ankle swelling (and on exam last OV there was no swelling/edema) - i do not rec increase in furosemide - pt should use compression hose (from medical supply store) and avoid sodium in diet - limited canned or processed foods and no extra salt on food - f/u as needed  Follow-up by: Newt Lukes MD,  March 31, 2010 12:28 PM  Additional Follow-up for Phone Call Additional follow up Details #1::        pt advised of above Additional Follow-up by: Margaret Pyle, CMA,  March 31, 2010 1:18 PM

## 2010-05-07 NOTE — Progress Notes (Signed)
Summary: lipitor & fexofenadine  Phone Note Refill Request   Refills Requested: Medication #1:  LIPITOR 20 MG TABS 1 by mouth once daily  Medication #2:  FEXOFENADINE HCL 180 MG TABS 1 by mouth Q AM  Method Requested: Fax to [facility] Initial call taken by: Orlan Leavens,  March 06, 2009 11:21 AM    Prescriptions: FEXOFENADINE HCL 180 MG TABS (FEXOFENADINE HCL) 1 by mouth Q AM  #90 x 3   Entered by:   Orlan Leavens   Authorized by:   Newt Lukes MD   Signed by:   Orlan Leavens on 03/06/2009   Method used:   Historical   RxID:   [id number] LIPITOR 20 MG TABS (ATORVASTATIN CALCIUM) 1 by mouth once daily  #90 x 3   Entered by:   Orlan Leavens   Authorized by:   Newt Lukes MD   Signed by:   Orlan Leavens on 03/06/2009   Method used:   Historical   RxID:   [id number]  Faxed back paper req to [facility] @ [phone]....03/06/09 @ 11:22am/LMB

## 2010-05-07 NOTE — Progress Notes (Signed)
Summary: 90 day rx  Phone Note Call from Patient Call back at Work Phone [phone]   Caller: Patient Summary of Call: pt called requesting 90 day supply of test strips sent to [facility]. pt has appt w MD on 10/11 but is almost out of strips. rx sent Initial call taken by: Margaret Pyle, CMA,  January 03, 2009 1:43 PM    Prescriptions: ONETOUCH ULTRA TEST  STRP (GLUCOSE BLOOD) 1 strip 4 times a day  #360 x 3   Entered by:   Margaret Pyle, CMA   Authorized by:   Minus Breeding MD   Signed by:   Margaret Pyle, CMA on 01/03/2009   Method used:   Faxed to ...       [facility] (retail)       [address]       [place], [place]  [address]       Ph: [phone]       Fax: [phone]   RxID:   [id number]

## 2010-05-07 NOTE — Assessment & Plan Note (Signed)
Summary: 3 MTH FU  STC   Vital Signs:  Patient profile:   60 year old female Menstrual status:  hysterectomy Height:      63 inches (160.02 cm) Weight:      247.38 pounds (112.45 kg) BMI:     43.98 O2 Sat:      96 % on Room air Temp:     98.5 degrees F (36.94 degrees C) oral Pulse rate:   84 / minute BP sitting:   130 / 76  (left arm) Cuff size:   large  Vitals Entered By: Brenton Grills CMA Duncan Dull) (March 23, 2010 2:23 PM)  O2 Flow:  Room air CC: 3 month F/U/blurred vision, bilateral swelling in ankles x 2 weeks/aj Is Patient Diabetic? Yes Comments Pt is due for mammogram     Menstrual Status hysterectomy   Referring Ripley Lovecchio:  Clancy Gourd Primary Ason Heslin:  Newt Lukes MD  CC:  3 month F/U/blurred vision and bilateral swelling in ankles x 2 weeks/aj.  History of Present Illness: she brings a record of her cbg's which i have reviewed today.  it is sometimes low before lunch.  it is highest at hs, but sometimes is as low as the low-100's at hs.  she does not take supper humalog, as she often eats out.  cbg is also sometimes over 200 in am.  she feels this is due to hs snack.    Current Medications (verified): 1)  Metformin Hcl 1000 Mg Tabs (Metformin Hcl) .Marland Kitchen.. 1 By Mouth Qam, 1 By Mouth Qpm 2)  Lipitor 20 Mg Tabs (Atorvastatin Calcium) .Marland Kitchen.. 1 By Mouth Once Daily 3)  Premarin 0.3 Mg Tabs (Estrogens Conjugated) .Marland Kitchen.. 1 By Mouth Qd 4)  Synthroid 50 Mcg Tabs (Levothyroxine Sodium) .Marland Kitchen.. 1 By Mouth Qd 5)  Urocit-K 10 1080 Mg Cr-Tabs (Potassium Citrate) .... 2 By Mouth Qam, 2 By Mouth Qpm 6)  Hydrochlorothiazide 25 Mg Tabs (Hydrochlorothiazide) .Marland Kitchen.. 1 By Mouth Qam 7)  Effexor Xr 150 Mg Xr24h-Cap (Venlafaxine Hcl) .Marland Kitchen.. 1 By Mouth Qam 8)  Abilify 20 Mg Tabs (Aripiprazole) .Marland Kitchen.. 1 By Mouth At Bedtime 9)  Lamotrigine 200 Mg Tabs (Lamotrigine) .Marland Kitchen.. 1 By Mouth Q Noon 10)  Clonazepam 1 Mg Tabs (Clonazepam) .... 2 By Mouth At Bedtime 11)  Seroquel Xr 150 Mg Xr24h-Tab  (Quetiapine Fumarate) .Marland Kitchen.. 1 in Am, 3 At Bedtime 12)  Mirtazapine 30 Mg Tabs (Mirtazapine) .Marland Kitchen.. 1 By Mouth Qhs 13)  Multivitamins  Caps (Multiple Vitamin) .Marland Kitchen.. 1 By Mouth Once Daily 14)  Vitamin D-3  1000 Iu .Marland Kitchen.. 1 By Mouth At Bedtime 15)  Humalog 100 Unit/ml Soln (Insulin Lispro (Human)) .... 3 Times A Day (Qac) 12-0-14- Units 16)  Topiramate 100 Mg Tabs (Topiramate) .Marland Kitchen.. 1 Tab Bid 17)  Novolin N 100 Unit/ml Susp (Insulin Isophane Human) .... 30 Units Qhs 18)  Onetouch Ultra Test  Strp (Glucose Blood) .Marland Kitchen.. 1 Strip 4 Times A Day 19)  Aspirin 81 Mg Tabs (Aspirin) .... Take 1 By Mouth Qd 20)  Oxybutynin Chloride 10 Mg Xr24h-Tab (Oxybutynin Chloride) .Marland Kitchen.. 1 At Pm 21)  Furosemide 40 Mg Tabs (Furosemide) .Marland Kitchen.. 1 By Mouth Qd 22)  Bd Insulin Syringe Ultrafine 31g X 5/16" 0.5 Ml Misc (Insulin Syringe-Needle U-100) .... Any Brand, 4x A Day 23)  Biotin 1000 Mcg Tabs (Biotin) .... Take 1 By Mouth Once Daily 24)  Naproxen 500 Mg Tabs (Naproxen) .... Take 2 By Mouth Once Daily  Allergies (verified): No Known Drug Allergies  Past History:  Past Medical History: Last updated: 01/02/2010 IDDM ALLERGIC RHINITIS ASYMPTOMATIC POSTMENOPAUSAL STATUS (ICD-V49.81) HYPERCHOLESTEROLEMIA  HYPERTENSION BIPOLAR DISORDER UNSPECIFIED  HYPOTHYROIDISM (ICD-244.9) GOITER  ?fibromyalga - tx by ortho/rheum      physician roster: Dr. Bryson Ha at [facility]. Dr. Lucienne Minks at [facility] orthopedics  Dr. Edison Pace Dr. Artist [place] Dr. Lavonia Drafts Dr. Everardo All - endo Dr. Dierdre Forth - rheum Dr. Jenne Pane - ENT  Review of Systems  The patient denies syncope.    Physical Exam  General:  obese.  no distress  Pulses:  dorsalis pedis intact bilat.  Extremities:  no deformity.  no ulcer on the feet.  feet are of normal color and temp.  1+ right pedal edema and 1+ left pedal edema.  there is a heavy callous on the right foot mtp area Neurologic:  sensation is intact to touch on the feet. 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 3 times a day (qac) 12-0-6-5 units units. (breakfast, lunch, supper, bedtime snack) 2)  Humalog Kwikpen 100 Unit/ml Soln (Insulin lispro (human)) .... Breakfast: 12 units, lunch: none, supper: 6 units, bedtime snack: 4 units.  Other Orders: TLB-A1C / Hgb A1C (Glycohemoglobin) (83036-A1C) Est. Patient Level III (60454)  Patient Instructions: 1)  blood tests are being ordered for you today.  please call [phone] to hear your test results. 2)  pending the results of today's blood tests: 3)  reduce nph to 25 units at night. 4)  take humalog (just before each meal) 12-0-6 units.  if you eat a bedtime snack, take 4 units of humalog for it.   5)  return 3 months. 6)  tests are being ordered for you today.  a few days after the test(s), please call [phone] to hear your test results. 7)  check your blood sugar 2 times a day.  vary the time of day when you check, between before the 3 meals, and at bedtime.  also check if you have symptoms of your blood sugar being too high or too low.  please keep a record of the readings and bring it to your next appointment here.  please call us sooner if you are having low blood sugar episodes. 8)  (update: i left message on phone-tree:  rx as we discussed) Prescriptions: HUMALOG KWIKPEN 100 UNIT/ML SOLN (INSULIN LISPRO (HUMAN)) breakfast: 12 units, lunch: none, supper: 6 units, bedtime snack: 4 units.  #1 box x 1   Entered and Authorized by:   Minus Breeding MD   Signed by:   Minus Breeding MD on 03/23/2010   Method used:   Print then Give to Patient   RxID:   [id number]    Orders Added: 1)  TLB-A1C / Hgb A1C (Glycohemoglobin) [83036-A1C] 2)  Est. Patient Level III [28413]

## 2010-05-07 NOTE — Progress Notes (Signed)
Summary: Call back  Phone Note Call from Patient Call back at Work Phone [phone]   Summary of Call: Pt left vm that she faxed the office and would like a call back.  Initial call taken by: Lamar Sprinkles, CMA,  September 19, 2009 2:37 PM  Follow-up for Phone Call        pt advised that fax not recieved. I advised pt to fax to [facility] fax ATTN Dahlia Follow-up by: Margaret Pyle, CMA,  September 22, 2009 8:31 AM

## 2010-05-07 NOTE — Letter (Signed)
Summary: [facility]  [facility]   Imported By: Lester Sunrise 05/15/2009 10:27:19  _____________________________________________________________________  External Attachment:    Type:   Image     Comment:   External Document

## 2010-05-07 NOTE — Assessment & Plan Note (Signed)
Summary: 1 MTH FU  $50---STC   Vital Signs:  Patient profile:   60 year old female Height:      63 inches Weight:      244 pounds BMI:     43.38 Temp:     97.6 degrees F oral Pulse rate:   102 / minute BP sitting:   110 / 78  (left arm) Cuff size:   large  Vitals Entered By: Bill Salinas CMA (July 25, 2008 1:05 PM) CC: follow-up visit   Referring Provider:  Clancy Gourd Primary Provider:  Clancy Gourd  CC:  follow-up visit.  History of Present Illness: pt says her am and noon cbg's are still above 200.  the am-cbg is higher than hs despite no hs-snack.  she feels well in general.    Current Medications (verified): 1)  Metformin Hcl 1000 Mg Tabs (Metformin Hcl) .Marland Kitchen.. 1 By Mouth Qam, 1 By Mouth Qpm 2)  Lipitor 20 Mg Tabs (Atorvastatin Calcium) .Marland Kitchen.. 1 By Mouth Once Daily 3)  Premarin 0.3 Mg Tabs (Estrogens Conjugated) .Marland Kitchen.. 1 By Mouth Qd 4)  Synthroid 50 Mcg Tabs (Levothyroxine Sodium) .Marland Kitchen.. 1 By Mouth Qd 5)  Urocit-K 10 1080 Mg Cr-Tabs (Potassium Citrate) .... 2 By Mouth Qam, 2 By Mouth Qpm 6)  Hydrochlorothiazide 25 Mg Tabs (Hydrochlorothiazide) .Marland Kitchen.. 1 By Mouth Qam 7)  Effexor Xr 150 Mg Xr24h-Cap (Venlafaxine Hcl) .Marland Kitchen.. 1 By Mouth Qam 8)  Abilify 20 Mg Tabs (Aripiprazole) .Marland Kitchen.. 1 By Mouth At Bedtime 9)  Lamotrigine 200 Mg Tabs (Lamotrigine) .Marland Kitchen.. 1 By Mouth Q Noon 10)  Clonazepam 1 Mg Tabs (Clonazepam) .Marland Kitchen.. 1 By Mouth At Bedtime 11)  Seroquel Xr 150 Mg Xr24h-Tab (Quetiapine Fumarate) .Marland Kitchen.. 1 in Am, 3 At Bedtime 12)  Fexofenadine Hcl 180 Mg Tabs (Fexofenadine Hcl) .Marland Kitchen.. 1 By Mouth Q Am 13)  Nasonex 50 Mcg/act Susp (Mometasone Furoate) .... 2 Sprays Daily 14)  Mirtazapine 30 Mg Tabs (Mirtazapine) .Marland Kitchen.. 1 By Mouth Qhs 15)  Multivitamins  Caps (Multiple Vitamin) .Marland Kitchen.. 1 By Mouth Once Daily 16)  Vitamin D-3  1000 Iu .Marland Kitchen.. 1 By Mouth At Bedtime 17)  Humalog 100 Unit/ml Soln (Insulin Lispro (Human)) .... 4 Times A Day (Qac) 03-11-11-4 Units 18)  Topiramate 100 Mg Tabs (Topiramate)  .Marland Kitchen.. 1 Tab Bid  Allergies (verified): No Known Drug Allergies  Past History:  Past Medical History:    IDDM (ICD-250.01)    ALLERGIC RHINITIS (ICD-477.9)    ASYMPTOMATIC POSTMENOPAUSAL STATUS (ICD-V49.81)    HYPERCHOLESTEROLEMIA (ICD-272.0)    HYPERTENSION (ICD-401.9)    BIPOLAR DISORDER UNSPECIFIED (ICD-296.80)    HYPOTHYROIDISM (ICD-244.9)     (06/10/2008)  Review of Systems       The patient complains of nausea.  The patient denies hypoglycemia.         correction: no nausea  Physical Exam  General:  obese.   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 1 tab bid 2)  Novolin N 100 Unit/ml Susp (Insulin isophane human) .... 4 units qhs  Other Orders: Est. Patient Level III ([address])  Patient Instructions: 1)  continue humalog (just before each meal) 03-11-11 2)  add nph 4 units at bedtime 3)  ret 6 weeks Prescriptions: NOVOLIN N 100 UNIT/ML SUSP (INSULIN ISOPHANE HUMAN) 4 units qhs  #3 vials  x 3   Entered and Authorized by:   Minus Breeding MD   Signed by:   Minus Breeding MD on 07/25/2008   Method used:   Electronically to        [facility]* (mail-order)       [address] [place], [place]  [address]       Ph: [phone]       Fax: [phone]   RxID:   [id number]

## 2010-05-07 NOTE — Progress Notes (Signed)
Summary: Novolin  Phone Note Call from Patient Call back at Work Phone [phone]   Summary of Call: Patient is requesting novolin to be called into to [facility] # [phone]. Initial call taken by: Lamar Sprinkles, CMA,  February 09, 2010 12:23 PM  Follow-up for Phone Call        Pt advised that Rx can be sent to pharm in [place] and transferred as needed. Pt agreed and Rx was sent Follow-up by: Margaret Pyle, CMA,  February 09, 2010 2:37 PM    Prescriptions: NOVOLIN N 100 UNIT/ML SUSP (INSULIN ISOPHANE HUMAN) 30 units qhs  #41mth x 1   Entered by:   Margaret Pyle, CMA   Authorized by:   Newt Lukes MD   Signed by:   Margaret Pyle, CMA on 02/09/2010   Method used:   [facility]        [facility] [address] (retail)       [address] [place], [place]  [address]       Ph: [phone] or [phone]       Fax: [phone]   RxID:   [id number]

## 2010-05-07 NOTE — Progress Notes (Signed)
  Phone Note Refill Request Message from:  Fax from Pharmacy on February 11, 2009 8:31 AM  Refills Requested: Medication #1:  HUMALOG 100 UNIT/ML SOLN 3 times a day (qac) 03-08-13- units   Dosage confirmed as above?Dosage Confirmed Initial call taken by: Josph Macho CMA,  February 11, 2009 8:32 AM    Prescriptions: HUMALOG 100 UNIT/ML SOLN (INSULIN LISPRO (HUMAN)) 3 times a day (qac) 03-08-13- units  #3 month x 0   Entered by:   Josph Macho CMA   Authorized by:   Minus Breeding MD   Signed by:   Josph Macho CMA on 02/11/2009   Method used:   [facility]        [facility]       [address]       [place], [place]  [address]       Ph: [phone]       Fax: [phone]   RxID:   [id number]

## 2010-05-07 NOTE — Consult Note (Signed)
Summary: [facility]  [facility]   Imported By: Sherian Rein 12/25/2009 10:11:33  _____________________________________________________________________  External Attachment:    Type:   Image     Comment:   External Document

## 2010-05-07 NOTE — Medication Information (Signed)
Summary: Diabetic shoes/[facility]  Diabetic shoes/[facility]   Imported By: Lester Whitfield 03/26/2009 10:24:19  _____________________________________________________________________  External Attachment:    Type:   Image     Comment:   External Document

## 2010-05-07 NOTE — Progress Notes (Signed)
Summary: HCTZ  Phone Note Refill Request Message from:  Fax from Pharmacy on October 22, 2008 3:22 PM  Refills Requested: Medication #1:  HYDROCHLOROTHIAZIDE 25 MG TABS 1 by mouth QAM [facility] [place] [phone]  Initial call taken by: Orlan Leavens,  October 22, 2008 3:23 PM    Prescriptions: HYDROCHLOROTHIAZIDE 25 MG TABS (HYDROCHLOROTHIAZIDE) 1 by mouth QAM  #30 x 6   Entered by:   Orlan Leavens   Authorized by:   Newt Lukes MD   Signed by:   Orlan Leavens on 10/22/2008   Method used:   [facility]        [facility] [address] [phone]* (retail)       [address] [place], [place]  [address]       Ph: [phone] or [phone]       Fax: [phone]   RxID:   [id number]

## 2010-05-07 NOTE — Assessment & Plan Note (Signed)
Summary: 3 MTH FU  #  STC--rs'd/cd   Vital Signs:  Patient profile:   60 year old female Height:      63 inches (160.02 cm) Weight:      242.50 pounds (110.23 kg) O2 Sat:      94 % on Room air Temp:     97.4 degrees F (36.33 degrees C) oral Pulse rate:   90 / minute BP sitting:   124 / 80  (left arm) Cuff size:   large  Vitals Entered By: Josph Macho CMA (March 24, 2009 2:33 PM)  O2 Flow:  Room air CC: 3 month follow up/ pt states she is no longer taking Potassium Chloride or Cefuroxime Axetil/ pt has paperwork that needs to be filled out/ pt sees Dr. Felicity Coyer after this appt/CF Is Patient Diabetic? Yes   Referring Provider:  Clancy Gourd Primary Provider:  Newt Lukes MD  CC:  3 month follow up/ pt states she is no longer taking Potassium Chloride or Cefuroxime Axetil/ pt has paperwork that needs to be filled out/ pt sees Dr. Felicity Coyer after this appt/CF.  History of Present Illness: pt states she feels well in general, except for fatigue.  she brings a record of her cbg's which i have reviewed today.  it is lowest in the afternoon (as low as 64), and highest in am (high-100's). she has 8 years of mild numbness of the feet, and associated pain.  Current Medications (verified): 1)  Metformin Hcl 1000 Mg Tabs (Metformin Hcl) .Marland Kitchen.. 1 By Mouth Qam, 1 By Mouth Qpm 2)  Lipitor 20 Mg Tabs (Atorvastatin Calcium) .Marland Kitchen.. 1 By Mouth Once Daily 3)  Premarin 0.3 Mg Tabs (Estrogens Conjugated) .Marland Kitchen.. 1 By Mouth Qd 4)  Synthroid 50 Mcg Tabs (Levothyroxine Sodium) .Marland Kitchen.. 1 By Mouth Qd 5)  Urocit-K 10 1080 Mg Cr-Tabs (Potassium Citrate) .... 2 By Mouth Qam, 2 By Mouth Qpm 6)  Hydrochlorothiazide 25 Mg Tabs (Hydrochlorothiazide) .Marland Kitchen.. 1 By Mouth Qam 7)  Effexor Xr 150 Mg Xr24h-Cap (Venlafaxine Hcl) .Marland Kitchen.. 1 By Mouth Qam 8)  Abilify 20 Mg Tabs (Aripiprazole) .Marland Kitchen.. 1 By Mouth At Bedtime 9)  Lamotrigine 200 Mg Tabs (Lamotrigine) .Marland Kitchen.. 1 By Mouth Q Noon 10)  Clonazepam 1 Mg Tabs  (Clonazepam) .Marland Kitchen.. 1 By Mouth At Bedtime 11)  Seroquel Xr 150 Mg Xr24h-Tab (Quetiapine Fumarate) .Marland Kitchen.. 1 in Am, 3 At Bedtime 12)  Fexofenadine Hcl 180 Mg Tabs (Fexofenadine Hcl) .Marland Kitchen.. 1 By Mouth Q Am 13)  Mirtazapine 30 Mg Tabs (Mirtazapine) .Marland Kitchen.. 1 By Mouth Qhs 14)  Multivitamins  Caps (Multiple Vitamin) .Marland Kitchen.. 1 By Mouth Once Daily 15)  Vitamin D-3  1000 Iu .Marland Kitchen.. 1 By Mouth At Bedtime 16)  Humalog 100 Unit/ml Soln (Insulin Lispro (Human)) .... 3 Times A Day (Qac) 03-08-13- Units 17)  Topiramate 100 Mg Tabs (Topiramate) .Marland Kitchen.. 1 Tab Bid 18)  Novolin N 100 Unit/ml Susp (Insulin Isophane Human) .... 20 Units Qhs 19)  Furosemide 40 Mg Tabs (Furosemide) .Marland Kitchen.. 1 By Mouth Qd 20)  Onetouch Ultra Test  Strp (Glucose Blood) .Marland Kitchen.. 1 Strip 4 Times A Day 21)  Aspirin 81 Mg Tabs (Aspirin) .... Take 1 By Mouth Qd 22)  Potassium Chloride 20 Meq Pack (Potassium Chloride) .Marland Kitchen.. 1 By Mouth Once Daily 23)  Allopurinol 100 Mg Tabs (Allopurinol) .... 2 A Day 24)  Oxybutynin Chloride 10 Mg Xr24h-Tab (Oxybutynin Chloride) .Marland Kitchen.. 1 At Pm 25)  Cefuroxime Axetil 250 Mg Tabs (Cefuroxime Axetil) .Marland Kitchen.. 1 Qd  Allergies (verified):  No Known Drug Allergies  Review of Systems  The patient denies weight loss, weight gain, and syncope.    Physical Exam  General:  obese.  no distress  Pulses:  dorsalis pedis intact bilat.  Extremities:  no deformity.  no ulcer on the feet.  feet are of normal color and temp.  1+ right pedal edema and 1+ left pedal edema.  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 3 times a day (qac) 03-06-13-  units 2)  Novolin N 100 Unit/ml Susp (Insulin isophane human) .... 25 units qhs  Other Orders: TLB-A1C / Hgb A1C (Glycohemoglobin) (83036-A1C) Est. Patient Level IV ([address])  Patient Instructions: 1)  pending the results of today's blood tests: 2)  increase nph to 25 units at night 3)  reduce humalog to (just before each meal) 03-06-13 units. 4)  return 3 months. 5)  tests are being ordered for you today.  a few days after the test(s), please call [phone] to hear your test results. 6)  i did form for dm shoes. 7)  (update: i left message on phone-tree:  rx as we discussed)

## 2010-05-07 NOTE — Assessment & Plan Note (Signed)
Summary: 3 MTH FU  #  STC   Vital Signs:  Patient profile:   60 year old female Height:      63 inches (160.02 cm) Weight:      242.8 pounds (110.36 kg) Temp:     97.4 degrees F (36.33 degrees C) oral Pulse rate:   90 / minute BP sitting:   124 / 80  (left arm) Cuff size:   large  Vitals Entered By: Orlan Leavens (March 24, 2009 3:20 PM) CC: 3 month follow-up/ Pt states she feel couple weeks ago injured (R) knee Is Patient Diabetic? Yes Did you bring your meter with you today? No Pain Assessment Patient in pain? no        Primary Care Danai Gotto:  Newt Lukes MD  CC:  3 month follow-up/ Pt states she feel couple weeks ago injured (R) knee.  History of Present Illness: here for followup  1) c/o injury to right knee precipitated by fall few weeks ago - accidental in the night while going to [place] no swelling or trouble walking -  trenderness with direct pressure to front of knee no falls since then or leg weakness -   2) DM - drop sugars in the afternoon - just saw endo- insuli n changes rec -  3) edema - has completely resolved -  still using furosemide with potassium meds aw occ urinary incontinence - so takes at night and wears depends to avid daytime accidents  4) HTN - reports compliance with ongoing medical treatment and no changes in medication dose or frequency. denies adverse side effects related to current therapy.   Current Medications (verified): 1)  Metformin Hcl 1000 Mg Tabs (Metformin Hcl) .Marland Kitchen.. 1 By Mouth Qam, 1 By Mouth Qpm 2)  Lipitor 20 Mg Tabs (Atorvastatin Calcium) .Marland Kitchen.. 1 By Mouth Once Daily 3)  Premarin 0.3 Mg Tabs (Estrogens Conjugated) .Marland Kitchen.. 1 By Mouth Qd 4)  Synthroid 50 Mcg Tabs (Levothyroxine Sodium) .Marland Kitchen.. 1 By Mouth Qd 5)  Urocit-K 10 1080 Mg Cr-Tabs (Potassium Citrate) .... 2 By Mouth Qam, 2 By Mouth Qpm 6)  Hydrochlorothiazide 25 Mg Tabs (Hydrochlorothiazide) .Marland Kitchen.. 1 By Mouth Qam 7)  Effexor Xr 150 Mg Xr24h-Cap (Venlafaxine Hcl) .Marland Kitchen.. 1  By Mouth Qam 8)  Abilify 20 Mg Tabs (Aripiprazole) .Marland Kitchen.. 1 By Mouth At Bedtime 9)  Lamotrigine 200 Mg Tabs (Lamotrigine) .Marland Kitchen.. 1 By Mouth Q Noon 10)  Clonazepam 1 Mg Tabs (Clonazepam) .Marland Kitchen.. 1 By Mouth At Bedtime 11)  Seroquel Xr 150 Mg Xr24h-Tab (Quetiapine Fumarate) .Marland Kitchen.. 1 in Am, 3 At Bedtime 12)  Fexofenadine Hcl 180 Mg Tabs (Fexofenadine Hcl) .Marland Kitchen.. 1 By Mouth Q Am 13)  Mirtazapine 30 Mg Tabs (Mirtazapine) .Marland Kitchen.. 1 By Mouth Qhs 14)  Multivitamins  Caps (Multiple Vitamin) .Marland Kitchen.. 1 By Mouth Once Daily 15)  Vitamin D-3  1000 Iu .Marland Kitchen.. 1 By Mouth At Bedtime 16)  Humalog 100 Unit/ml Soln (Insulin Lispro (Human)) .... 3 Times A Day (Qac) 03-08-13- Units 17)  Topiramate 100 Mg Tabs (Topiramate) .Marland Kitchen.. 1 Tab Bid 18)  Novolin N 100 Unit/ml Susp (Insulin Isophane Human) .... 20 Units Qhs 19)  Furosemide 40 Mg Tabs (Furosemide) .Marland Kitchen.. 1 By Mouth Qd 20)  Onetouch Ultra Test  Strp (Glucose Blood) .Marland Kitchen.. 1 Strip 4 Times A Day 21)  Aspirin 81 Mg Tabs (Aspirin) .... Take 1 By Mouth Qd 22)  Potassium Chloride 20 Meq Pack (Potassium Chloride) .Marland Kitchen.. 1 By Mouth Once Daily 23)  Allopurinol 100 Mg Tabs (Allopurinol) .Marland KitchenMarland KitchenMarland Kitchen  2 A Day 24)  Oxybutynin Chloride 10 Mg Xr24h-Tab (Oxybutynin Chloride) .Marland Kitchen.. 1 At Pm 25)  Cefuroxime Axetil 250 Mg Tabs (Cefuroxime Axetil) .Marland Kitchen.. 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 Bryson Ha at [facility]. Dr. Lucienne Minks at [facility] orthopedics Dr. Edison Pace Dr. [place] Dr. Lavonia Drafts Dr. Everardo All - endo  Review of Systems  The patient denies fever, weight loss, chest pain, syncope, dyspnea on exertion, and peripheral edema.    Physical  Exam  General:  alert, well-developed, well-nourished, and cooperative to examination.    Lungs:  normal respiratory effort, no intercostal retractions or use of accessory muscles; normal breath sounds bilaterally - no crackles and no wheezes.    Heart:  normal rate, regular rhythm, no murmur, and no rub. BLE without edema.  Msk:  right knee: full range of motion, no joint effusion or swelling. no erythema or abnormal warmth. Stable to ligamentous testing. Nontender to palpation. Neurovascularly intact.    Impression & Recommendations:  Problem # 1:  CONTUSION, RIGHT KNEE (ICD-924.11) reassurance provided - exam benign Time spent with patient 25 minutes, more than 50% of this time was spent counseling patient on knee injury and problems concerning the need for further eval if change in signs or symptoms   Problem # 2:  EDEMA (ICD-782.3) Assessment: Improved  Her updated medication list for this problem includes:    Hydrochlorothiazide 25 Mg Tabs (Hydrochlorothiazide) .Marland Kitchen... 1 by mouth qam    Furosemide 40 Mg Tabs (Furosemide) .Marland Kitchen... 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 1 by mouth qam, 1 by mouth qpm    Humalog 100 Unit/ml Soln (Insulin lispro (human)) .Marland KitchenMarland KitchenMarland KitchenMarland Kitchen 3 times a day (qac) 03-06-13- units    Novolin N 100 Unit/ml Susp (Insulin isophane human) .Marland Kitchen... 25 units qhs    Aspirin 81 Mg Tabs (Aspirin) .Marland Kitchen... Take 1 by mouth qd  Problem # 4:  HYPERTENSION (ICD-401.9)  Her updated medication list for this problem includes:    Hydrochlorothiazide 25 Mg Tabs (Hydrochlorothiazide) .Marland Kitchen... 1 by mouth qam    Furosemide 40 Mg Tabs (Furosemide) .Marland Kitchen... 1 by mouth qd  BP today: 124/80 Prior BP: 138/68 (01/17/2009)  Labs Reviewed: K+: 3.7 (12/24/2008) Creat: : 0.8 (12/24/2008)   Chol: 115 (09/03/2008)   HDL: 53.50 (09/03/2008)   LDL: 42 (09/03/2008)   TG: 97.0  (09/03/2008)  Problem # 5:  HYPERCHOLESTEROLEMIA (ICD-272.0)  Her updated medication list for this problem includes:    Lipitor 20 Mg Tabs (Atorvastatin calcium) .Marland Kitchen... 1 by mouth once daily  Labs Reviewed: SGOT: 46 (09/25/2008)   SGPT: 38 (09/25/2008)   HDL:53.50 (09/03/2008)  LDL:42 (09/03/2008)  Chol:115 (09/03/2008)  Trig:97.0 (09/03/2008)  Problem # 6:  HYPOTHYROIDISM (ICD-244.9)  Her updated medication list for this problem includes:    Synthroid 50 Mcg Tabs (Levothyroxine sodium) .Marland Kitchen... 1 by mouth qd  Labs Reviewed: TSH: 1.05 (09/03/2008)    HgBA1c: 7.7 (12/24/2008) Chol: 115 (09/03/2008)   HDL: 53.50 (09/03/2008)   LDL: 42 (09/03/2008)   TG: 97.0 (09/03/2008)  Complete Medication List: 1)  Metformin Hcl 1000 Mg Tabs (Metformin hcl) .Marland Kitchen.. 1 by mouth qam, 1 by mouth qpm 2)  Lipitor  20 Mg Tabs (Atorvastatin calcium) .Marland Kitchen.. 1 by mouth once daily 3)  Premarin 0.3 Mg Tabs (Estrogens conjugated) .Marland Kitchen.. 1 by mouth qd 4)  Synthroid 50 Mcg Tabs (Levothyroxine sodium) .Marland Kitchen.. 1 by mouth qd 5)  Urocit-k 10 1080 Mg Cr-tabs (Potassium citrate) .... 2 by mouth qam, 2 by mouth qpm 6)  Hydrochlorothiazide 25 Mg Tabs (Hydrochlorothiazide) .Marland Kitchen.. 1 by mouth qam 7)  Effexor Xr 150 Mg Xr24h-cap (Venlafaxine hcl) .Marland Kitchen.. 1 by mouth qam 8)  Abilify 20 Mg Tabs (Aripiprazole) .Marland Kitchen.. 1 by mouth at bedtime 9)  Lamotrigine 200 Mg Tabs (Lamotrigine) .Marland Kitchen.. 1 by mouth q noon 10)  Clonazepam 1 Mg Tabs (Clonazepam) .Marland Kitchen.. 1 by mouth at bedtime 11)  Seroquel Xr 150 Mg Xr24h-tab (Quetiapine fumarate) .Marland Kitchen.. 1 in am, 3 at bedtime 12)  Fexofenadine Hcl 180 Mg Tabs (Fexofenadine hcl) .Marland Kitchen.. 1 by mouth q am 13)  Mirtazapine 30 Mg Tabs (Mirtazapine) .Marland Kitchen.. 1 by mouth qhs 14)  Multivitamins Caps (Multiple vitamin) .Marland Kitchen.. 1 by mouth once daily 15)  Vitamin D-3 1000 Iu  .Marland Kitchen.. 1 by mouth at bedtime 16)  Humalog 100 Unit/ml Soln (Insulin lispro (human)) .... 3 times a day (qac) 03-06-13- units 17)  Topiramate 100 Mg Tabs (Topiramate) .Marland Kitchen.. 1 tab  bid 18)  Novolin N 100 Unit/ml Susp (Insulin isophane human) .... 25 units qhs 19)  Furosemide 40 Mg Tabs (Furosemide) .Marland Kitchen.. 1 by mouth qd 20)  Onetouch Ultra Test Strp (Glucose blood) .Marland Kitchen.. 1 strip 4 times a day 21)  Aspirin 81 Mg Tabs (Aspirin) .... Take 1 by mouth qd 22)  Allopurinol 100 Mg Tabs (Allopurinol) .... 2 a day 23)  Oxybutynin Chloride 10 Mg Xr24h-tab (Oxybutynin chloride) .Marland Kitchen.. 1 at pm  Patient Instructions: 1)  it was good to see you today.  2)  no medication changes recomended by me - as per dr. Everardo All 3)  Please schedule a follow-up appointment in 3 months, sooner if problems.

## 2010-05-15 ENCOUNTER — Other Ambulatory Visit: Payer: Self-pay | Admitting: Internal Medicine

## 2010-05-15 DIAGNOSIS — Z1231 Encounter for screening mammogram for malignant neoplasm of breast: Secondary | ICD-10-CM

## 2010-05-26 ENCOUNTER — Telehealth: Payer: Self-pay | Admitting: Endocrinology

## 2010-06-02 NOTE — Progress Notes (Signed)
Summary: rx refill req  Phone Note Refill Request Message from:  Fax from Pharmacy on May 26, 2010 8:58 AM  Refills Requested: Medication #1:  FUROSEMIDE 40 MG TABS 1 by mouth qd   Dosage confirmed as above?Dosage Confirmed   Last Refilled: 08/14/2009  Method Requested: Fax to Mail Away Pharmacy Next Appointment Scheduled: 08/03/2010 Initial call taken by: Brenton Grills CMA (AAMA),  May 26, 2010 8:59 AM    Prescriptions: FUROSEMIDE 40 MG TABS (FUROSEMIDE) 1 by mouth qd  #90 x 0   Entered by:   Brenton Grills CMA (AAMA)   Authorized by:   Minus Breeding MD   Signed by:   Brenton Grills CMA (AAMA) on 05/26/2010   Method used:   Faxed to ...       [facility]       [address] [place], [place]  [address]       Ph: [phone]       Fax: [phone]   RxID:   [id number]

## 2010-06-05 ENCOUNTER — Ambulatory Visit: Payer: Self-pay

## 2010-06-23 LAB — POCT I-STAT, CHEM 8
BUN: 8 mg/dL (ref 6–23)
Calcium, Ion: 1.08 mmol/L — ABNORMAL LOW (ref 1.12–1.32)
Chloride: 102 mEq/L (ref 96–112)
Creatinine, Ser: 0.8 mg/dL (ref 0.4–1.2)
Glucose, Bld: 170 mg/dL — ABNORMAL HIGH (ref 70–99)
HCT: 41 % (ref 36.0–46.0)
Hemoglobin: 13.9 g/dL (ref 12.0–15.0)
Potassium: 3.2 mEq/L — ABNORMAL LOW (ref 3.5–5.1)
Sodium: 140 mEq/L (ref 135–145)
TCO2: 31 mmol/L (ref 0–100)

## 2010-06-23 LAB — GLUCOSE, CAPILLARY: Glucose-Capillary: 169 mg/dL — ABNORMAL HIGH (ref 70–99)

## 2010-06-24 ENCOUNTER — Encounter: Payer: Self-pay | Admitting: *Deleted

## 2010-06-26 ENCOUNTER — Ambulatory Visit
Admission: RE | Admit: 2010-06-26 | Discharge: 2010-06-26 | Disposition: A | Discharge status: Home / Self Care | Payer: Medicare Other | Source: Ambulatory Visit | Attending: Internal Medicine | Admitting: Internal Medicine

## 2010-06-26 DIAGNOSIS — Z1231 Encounter for screening mammogram for malignant neoplasm of breast: Secondary | ICD-10-CM

## 2010-07-03 ENCOUNTER — Encounter: Payer: Self-pay | Admitting: Internal Medicine

## 2010-07-04 ENCOUNTER — Encounter: Payer: Self-pay | Admitting: Internal Medicine

## 2010-07-08 ENCOUNTER — Encounter: Payer: Self-pay | Admitting: Internal Medicine

## 2010-07-08 ENCOUNTER — Ambulatory Visit (INDEPENDENT_AMBULATORY_CARE_PROVIDER_SITE_OTHER): Payer: Medicare Other | Admitting: Endocrinology

## 2010-07-08 ENCOUNTER — Ambulatory Visit (INDEPENDENT_AMBULATORY_CARE_PROVIDER_SITE_OTHER): Payer: Medicare Other | Admitting: Internal Medicine

## 2010-07-08 ENCOUNTER — Other Ambulatory Visit (INDEPENDENT_AMBULATORY_CARE_PROVIDER_SITE_OTHER): Payer: Medicare Other

## 2010-07-08 ENCOUNTER — Encounter: Payer: Self-pay | Admitting: Endocrinology

## 2010-07-08 VITALS — BP 116/72 | HR 95 | Temp 97.8°F | Ht 62.5 in | Wt 240.0 lb

## 2010-07-08 DIAGNOSIS — E7800 Pure hypercholesterolemia, unspecified: Secondary | ICD-10-CM

## 2010-07-08 DIAGNOSIS — E109 Type 1 diabetes mellitus without complications: Secondary | ICD-10-CM

## 2010-07-08 DIAGNOSIS — E039 Hypothyroidism, unspecified: Secondary | ICD-10-CM

## 2010-07-08 DIAGNOSIS — IMO0001 Reserved for inherently not codable concepts without codable children: Secondary | ICD-10-CM

## 2010-07-08 DIAGNOSIS — I10 Essential (primary) hypertension: Secondary | ICD-10-CM

## 2010-07-08 LAB — HEMOGLOBIN A1C: Hgb A1c MFr Bld: 9.5 % — ABNORMAL HIGH (ref 4.6–6.5)

## 2010-07-08 MED ORDER — INSULIN ASPART 100 UNIT/ML ~~LOC~~ SOLN: SUBCUTANEOUS | Status: DC

## 2010-07-08 MED ORDER — NAPROXEN 500 MG PO TABS: 500.0000 mg | ORAL_TABLET | Freq: Two times a day (BID) | ORAL | Status: DC

## 2010-07-08 NOTE — Progress Notes (Signed)
  Subjective:    Patient ID: Brittany Acosta, female    DOB: [birth year], 60 y.o.   MRN: [id number]  HPI Pt says cbg's went high (at all times of day), approx 2 mos ago.  Pt says this is driven by pain, and by a death in the family.  She says her diet is good, but exercise is limited by pain.  She is taking the humalog only--not the nph. Past Medical History  Diagnosis Date  . GOITER, MULTINODULAR 03/24/2009  . HYPOTHYROIDISM 06/10/2008  . IDDM 06/10/2008  . HYPERCHOLESTEROLEMIA 06/10/2008  . Morbid obesity 09/03/2008  . BIPOLAR DISORDER UNSPECIFIED 06/10/2008  . HYPERTENSION 06/10/2008  . Chronic diastolic heart failure 12/24/2008  . ALLERGIC RHINITIS 06/10/2008  . APHTHOUS STOMATITIS 05/23/2009  . TRANSAMINASES, SERUM, ELEVATED 09/26/2008  . ASYMPTOMATIC POSTMENOPAUSAL STATUS 06/10/2008  . FIBROMYALGIA 03/23/2010   Past Surgical History  Procedure Date  . Cholecystectomy 1985  . Abdominal hysterectomy 1976  . Tubal ligation 1972  . Left ovary removed 1980  . Right nasal surgery 08/1988  . Right sinus removed 08/1989    tooth partial  . Percitania stone removed (l) kidney 1992  . Lithotripsy (l) kidney 1997  . Left knee arthroscopy      Partial medical menisectomy  . Left cystoscopy  1990  . Right knee surgery  08/2009    reports that she quit smoking about 27 years ago. She does not have any smokeless tobacco history on file. She reports that she does not drink alcohol or use illicit drugs. family history includes Coronary artery disease in her other; Diabetes in her father; Heart attack in her other; Hyperlipidemia in her father; and Hypertension in her father and mother. No Known Allergies   Review of Systems denies hypoglycemia.  She has lost a few lbs recently.      Objective:   Physical Exam GENERAL: no distress.  Obese.   Neck - No masses or thyromegaly or limitation in range of motion.        Assessment & Plan:  Dm, therapy limited by noncompliance.  i'll do the best i can.

## 2010-07-08 NOTE — Assessment & Plan Note (Signed)
BP Readings from Last 3 Encounters:  07/08/10 116/72  07/08/10 116/72  03/23/10 130/76   The current medical regimen is effective;  continue present plan and medications.

## 2010-07-08 NOTE — Progress Notes (Signed)
  Subjective:    Patient ID: Brittany Acosta, female    DOB: 1950-05-14, 60 y.o.   MRN: [id number]  HPI here for f/u- allg rhinitis - exac due to no coverage for antihistamine pills - not using nasal steroid regularly -   DM2 - follows with endo for same. reports compliance with ongoing medical treatment and no changes in medication dose or frequency. denies adverse side effects related to current therapy.  home cbg log reviewed - no hypoglycemia events  edema - completely resolved -  still using furosemide with potassium meds associated with occ urinary incontinence - so takes at night and wears depends to avid daytime accidents  HTN - reports compliance with ongoing medical treatment and no changes in medication dose or frequency. denies adverse side effects related to current therapy.   dyslipidemia- reports compliance with ongoing medical treatment and no changes in medication dose or frequency. denies adverse side effects related to current therapy.   hypothyroid - reports compliance with ongoing medical treatment and no changes in medication dose or frequency. denies adverse side effects related to current therapy.   Past Medical History  Diagnosis Date  . GOITER, MULTINODULAR 03/24/2009  . HYPOTHYROIDISM 06/10/2008  . IDDM 06/10/2008  . HYPERCHOLESTEROLEMIA 06/10/2008  . Morbid obesity 09/03/2008  . BIPOLAR DISORDER UNSPECIFIED 06/10/2008  . HYPERTENSION 06/10/2008  . Chronic diastolic heart failure 12/24/2008  . ALLERGIC RHINITIS 06/10/2008  . APHTHOUS STOMATITIS 05/23/2009  . TRANSAMINASES, SERUM, ELEVATED 09/26/2008  . ASYMPTOMATIC POSTMENOPAUSAL STATUS 06/10/2008  . FIBROMYALGIA 03/23/2010    Review of Systems  Constitutional: Negative for fever and unexpected weight change.  Respiratory: Negative for cough.   Cardiovascular: Negative for chest pain.      Objective:   Physical Exam BP 116/72  Pulse 95  Temp(Src) 97.8 F (36.6 C) (Oral)  Ht 5\' 3"  (1.6 m)  Wt 240 lb (108.863 kg)   BMI 42.51 kg/m2     Physical Exam  Constitutional: She is oriented to person, place, and time. She appears well-developed and well-nourished. No distress.  Neck: Normal range of motion. Neck supple. No JVD present. No thyromegaly present.  Cardiovascular: Normal rate, regular rhythm and normal heart sounds.  No murmur heard. Pulmonary/Chest: Effort normal and breath sounds normal. No respiratory distress. She has no wheezes.  Musculoskeletal: Normal range of motion. She exhibits no edema. No joint effusions, full range of motion without pain or deformity Neurological: She is alert and oriented to person, place, and time. No cranial nerve deficit. Coordination normal.   Lab Results  Component Value Date   WBC 9.7 10/29/2008   HGB 13.9 08/11/2009   HCT 41.0 08/11/2009   PLT 385.0 10/29/2008   CHOL 122 03/23/2010   TRIG 205.0* 03/23/2010   HDL 43.50 03/23/2010   LDLDIRECT 52.3 03/23/2010   ALT 28 03/23/2010   AST 31 03/23/2010   NA 140 03/23/2010   K 3.9 03/23/2010   CL 99 03/23/2010   CREATININE 0.9 03/23/2010   BUN 16 03/23/2010   CO2 30 03/23/2010   TSH 1.05 09/03/2008   HGBA1C 9.5* 07/08/2010   MICROALBUR 0.2 12/24/2008   Assessment & Plan:  See problem list. Medications and labs reviewed today. Time spent with the patient 25 minutes, greater than 50% time spent counseling patient on diabetes, FM and medication review.

## 2010-07-08 NOTE — Patient Instructions (Addendum)
blood tests are being ordered for you today.  please call [phone] to hear your test results pending the results of today's blood tests: change humalog to novolog 20 units 2x a day (just before first and last meals of the day).  if you eat a bedtime snack, take 4 units of humalog for it.   Please make a follow-up appointment in 3 weeks.  Please call sooner if your blood sugar does not significantly improve.  check your blood sugar 2 times a day.  vary the time of day when you check, between before the 3 meals, and at bedtime.  also check if you have symptoms of your blood sugar being too high or too low.  please keep a record of the readings and bring it to your next appointment here.  please call us sooner if you are having low blood sugar episodes.

## 2010-07-08 NOTE — Progress Notes (Signed)
Addended by: Orlan Leavens on: 07/08/2010 03:02 PM   Modules accepted: Orders

## 2010-07-08 NOTE — Assessment & Plan Note (Signed)
Diffuse ache, no effusions - renew NSAIDs -

## 2010-07-08 NOTE — Progress Notes (Signed)
Had to print naproxen rx out and manually fax to [facility] @ [phone].Marland KitchenMarland Kitchen4/4/12@3 :03pm/LMB

## 2010-07-08 NOTE — Assessment & Plan Note (Signed)
Need updated TSH but the patient already completed labs - check next OV - pt aware of need to do same The current medical regimen is effective;  continue present plan and medications. Lab Results  Component Value Date   TSH 1.05 09/03/2008

## 2010-07-08 NOTE — Patient Instructions (Signed)
It was good to see you today. Medications reviewed, no changes at this time. Refill on medication(s) as discussed today. Please schedule followup in 3-4 months to review medications and other issues, call sooner if problems.

## 2010-07-08 NOTE — Assessment & Plan Note (Signed)
The current medical regimen is effective;  continue present plan and medications.  

## 2010-07-21 ENCOUNTER — Other Ambulatory Visit: Payer: Self-pay | Admitting: Internal Medicine

## 2010-07-21 MED ORDER — INSULIN ASPART 100 UNIT/ML ~~LOC~~ SOLN: SUBCUTANEOUS | Status: DC

## 2010-07-21 NOTE — Telephone Encounter (Addendum)
Rx for Insulin sent to [facility] did not go through electronically, rx printed to fax to  [facility].  Rx faxed to [facility]

## 2010-07-27 ENCOUNTER — Ambulatory Visit: Payer: Self-pay | Admitting: Internal Medicine

## 2010-07-27 ENCOUNTER — Ambulatory Visit (INDEPENDENT_AMBULATORY_CARE_PROVIDER_SITE_OTHER): Payer: Medicare Other | Admitting: Endocrinology

## 2010-07-27 ENCOUNTER — Encounter: Payer: Self-pay | Admitting: Endocrinology

## 2010-07-27 VITALS — BP 122/74 | HR 94 | Temp 98.3°F | Ht 63.0 in | Wt 245.0 lb

## 2010-07-27 DIAGNOSIS — E109 Type 1 diabetes mellitus without complications: Secondary | ICD-10-CM

## 2010-07-27 NOTE — Progress Notes (Signed)
  Subjective:    Patient ID: Brittany Acosta, female    DOB: 05-26-1950, 60 y.o.   MRN: [id number]  HPI pt states she feels well in general, except for cold intolerance.  She is not sure why her a1c has increased, as she does not have much weight change. She had been on the seroquel x a few years. she brings a record of her cbg's which i have reviewed today.  It is highest in am (170-220).  This is higher than at hs, despite her eating her eating little or nothing at hs.  Past Medical History  Diagnosis Date  . GOITER, MULTINODULAR 03/24/2009  . HYPOTHYROIDISM 06/10/2008  . IDDM 06/10/2008  . HYPERCHOLESTEROLEMIA 06/10/2008  . Morbid obesity 09/03/2008  . BIPOLAR DISORDER UNSPECIFIED 06/10/2008  . HYPERTENSION 06/10/2008  . Chronic diastolic heart failure 12/24/2008  . ALLERGIC RHINITIS 06/10/2008  . APHTHOUS STOMATITIS 05/23/2009  . TRANSAMINASES, SERUM, ELEVATED 09/26/2008  . ASYMPTOMATIC POSTMENOPAUSAL STATUS 06/10/2008  . FIBROMYALGIA 03/23/2010   Past Surgical History  Procedure Date  . Cholecystectomy 1985  . Abdominal hysterectomy 1976  . Tubal ligation 1972  . Left ovary removed 1980  . Right nasal surgery 08/1988  . Right sinus removed 08/1989    tooth partial  . Percitania stone removed (l) kidney 1992  . Lithotripsy (l) kidney 1997  . Left knee arthroscopy      Partial medical menisectomy  . Left cystoscopy  1990  . Right knee surgery  08/2009    reports that she quit smoking about 27 years ago. She does not have any smokeless tobacco history on file. She reports that she does not drink alcohol or use illicit drugs. family history includes Coronary artery disease in her other; Diabetes in her father; Heart attack in her other; Hyperlipidemia in her father; and Hypertension in her father and mother. No Known Allergies   Review of Systems denies hypoglycemia    Objective:   Physical Exam GENERAL: no distress.  Morbidly obese. SKIN: Insulin injection sites at the anterior abdomen are  normal, except for a few ecchymoses.    Lab Results  Component Value Date   HGBA1C 9.5* 07/08/2010      Assessment & Plan:  Dm, worse.

## 2010-07-27 NOTE — Patient Instructions (Addendum)
increase nph to 30 units at night. take humalog (just before each meal) 20-0-20 units.  if you eat a bedtime snack, take 4 units of humalog for it.   return 1 month. tests are being ordered for you today.  a few days after the test(s), please call [phone] to hear your test results. check your blood sugar 2 times a day.  vary the time of day when you check, between before the 3 meals, and at bedtime.  also check if you have symptoms of your blood sugar being too high or too low.  please keep a record of the readings and bring it to your next appointment here.  please call us sooner if you are having low blood sugar episodes.

## 2010-08-03 ENCOUNTER — Ambulatory Visit: Payer: Self-pay | Admitting: Internal Medicine

## 2010-08-03 ENCOUNTER — Ambulatory Visit: Payer: Self-pay | Admitting: Endocrinology

## 2010-08-27 ENCOUNTER — Encounter: Payer: Self-pay | Admitting: Endocrinology

## 2010-08-27 ENCOUNTER — Ambulatory Visit (INDEPENDENT_AMBULATORY_CARE_PROVIDER_SITE_OTHER): Payer: Medicare Other | Admitting: Endocrinology

## 2010-08-27 ENCOUNTER — Other Ambulatory Visit: Payer: Self-pay | Admitting: *Deleted

## 2010-08-27 ENCOUNTER — Other Ambulatory Visit (INDEPENDENT_AMBULATORY_CARE_PROVIDER_SITE_OTHER): Payer: Medicare Other

## 2010-08-27 DIAGNOSIS — E559 Vitamin D deficiency, unspecified: Secondary | ICD-10-CM | POA: Insufficient documentation

## 2010-08-27 DIAGNOSIS — E039 Hypothyroidism, unspecified: Secondary | ICD-10-CM

## 2010-08-27 DIAGNOSIS — E109 Type 1 diabetes mellitus without complications: Secondary | ICD-10-CM

## 2010-08-27 LAB — TSH: TSH: 0.82 u[IU]/mL (ref 0.35–5.50)

## 2010-08-27 MED ORDER — LEVOTHYROXINE SODIUM 50 MCG PO TABS: 50.0000 ug | ORAL_TABLET | Freq: Every day | ORAL | Status: DC

## 2010-08-27 NOTE — Progress Notes (Signed)
Subjective:    Patient ID: Brittany Acosta, female    DOB: 05-10-1950, 60 y.o.   MRN: [id number]  HPI she brings a record of her cbg's which i have reviewed today.  It varies from 89 (breakfast) to 200 (afternoon).  She had steroid injection into the right knee, for pain there.  She otherwise feels well.   Past Medical History  Diagnosis Date  . GOITER, MULTINODULAR 03/24/2009  . HYPOTHYROIDISM 06/10/2008  . IDDM 06/10/2008  . HYPERCHOLESTEROLEMIA 06/10/2008  . Morbid obesity 09/03/2008  . BIPOLAR DISORDER UNSPECIFIED 06/10/2008  . HYPERTENSION 06/10/2008  . Chronic diastolic heart failure 12/24/2008  . ALLERGIC RHINITIS 06/10/2008  . APHTHOUS STOMATITIS 05/23/2009  . TRANSAMINASES, SERUM, ELEVATED 09/26/2008  . ASYMPTOMATIC POSTMENOPAUSAL STATUS 06/10/2008  . FIBROMYALGIA 03/23/2010    Past Surgical History  Procedure Date  . Cholecystectomy 1985  . Abdominal hysterectomy 1976  . Tubal ligation 1972  . Left ovary removed 1980  . Right nasal surgery 08/1988  . Right sinus removed 08/1989    tooth partial  . Percitania stone removed (l) kidney 1992  . Lithotripsy (l) kidney 1997  . Left knee arthroscopy      Partial medical menisectomy  . Left cystoscopy  1990  . Right knee surgery  08/2009    History   Social History  . Marital Status: Married    Spouse Name: N/A    Number of Children: N/A  . Years of Education: N/A   Occupational History  . Not on file.   Social History Main Topics  . Smoking status: Former Smoker    Quit date: 04/06/1983  . Smokeless tobacco: Not on file   Comment: Married, lives with spouse. Disable- 2 grown kids-6 g-kids  . Alcohol Use: No  . Drug Use: No  . Sexually Active:    Other Topics Concern  . Not on file   Social History Narrative  . No narrative on file    Current Outpatient Prescriptions on File Prior to Visit  Medication Sig Dispense Refill  . Alcohol Swabs (ALCOHOL WIPES) 70 % PADS by Does not apply route. Use as directed        .  ARIPiprazole (ABILIFY) 20 MG tablet Take 20 mg by mouth at bedtime.        Marland Kitchen aspirin 81 MG tablet Take 81 mg by mouth at bedtime.       Marland Kitchen atorvastatin (LIPITOR) 20 MG tablet Take 20 mg by mouth daily.       . B-D UF III MINI PEN NEEDLES 31G X 5 MM MISC       . Biotin 5000 MCG CAPS Take 1 capsule by mouth 3 (three) times daily.        . Cholecalciferol (VITAMIN D) 1000 UNITS capsule Take 1,000 Units by mouth at bedtime.        . clonazePAM (KLONOPIN) 1 MG tablet Take 1 mg by mouth at bedtime. Take 2 by mouth at bedtime       . estrogens, conjugated, (PREMARIN) 0.3 MG tablet Take 0.3 mg by mouth daily.       . furosemide (LASIX) 40 MG tablet Take 40 mg by mouth daily.        . Glucose Blood (ONETOUCH ULTRA BLUE VI) Use 1 strip four times to day to check blood sugars      . hydrochlorothiazide 25 MG tablet Take 25 mg by mouth every morning.        . lamoTRIgine (LAMICTAL) 200  MG tablet Take 200 mg by mouth daily at 12 noon.        . metFORMIN (GLUCOPHAGE) 1000 MG tablet Take 1,000 mg by mouth 2 (two) times daily with a meal.        . mirtazapine (REMERON) 30 MG tablet Take 30 mg by mouth at bedtime.        . Multiple Vitamin (MULTIVITAMIN) tablet Take 1 tablet by mouth daily.        . naproxen (NAPROSYN) 500 MG tablet Take 1 tablet (500 mg total) by mouth 2 (two) times daily with a meal.  180 tablet  1  . oxybutynin (DITROPAN-XL) 10 MG 24 hr tablet Take 10 mg by mouth every evening.        Bertram Gala Glycol-Propyl Glycol (SYSTANE) 0.4-0.3 % SOLN Apply to eye. 3 times daily       . potassium citrate (UROCIT-K 10) 10 MEQ (1080 MG) SR tablet Take 10 mEq by mouth. Take 2 by mouth every morning and 2 in the evening       . QUEtiapine Fumarate (SEROQUEL XR) 150 MG TB24 Take by mouth. Take 1 every morning and 3  At bedtime       . topiramate (TOPAMAX) 100 MG tablet Take 100 mg by mouth 2 (two) times daily.        Marland Kitchen venlafaxine (EFFEXOR-XR) 150 MG 24 hr capsule Take 150 mg by mouth every morning.          . insulin NPH (HUMULIN N,NOVOLIN N) 100 UNIT/ML injection Inject 30 Units into the skin at bedtime.        . Insulin Syringe-Needle U-100 (B-D INSULIN SYRINGE) 31G X 5/16" 0.3 ML MISC 3 times daily        No Known Allergies  Family History  Problem Relation Age of Onset  . Hypertension Mother   . Diabetes Father   . Hypertension Father   . Hyperlipidemia Father   . Heart attack Other   . Coronary artery disease Other     BP 122/78  Pulse 95  Temp(Src) 98 F (36.7 C) (Oral)  Ht 5\' 3"  (1.6 m)  Wt 250 lb 6.4 oz (113.581 kg)  BMI 44.36 kg/m2  SpO2 94%    Review of Systems denies hypoglycemia    Objective:   Physical Exam Pulses: dorsalis pedis intact bilat.   Feet: no deformity.  no ulcer on the feet.  feet are of normal color and temp.  no edema Neuro: sensation is intact to touch on the feet, but slightly decreased from normal.    Fructosamine= 237 (converts to a1c of 5.8)    Assessment & Plan:  Dm, overcontrolled

## 2010-08-27 NOTE — Patient Instructions (Addendum)
continue nph 30 units at night. increase humalog to (just before each meal) 20-5-20 units.  if you eat a bedtime snack, take 4 units of humalog for it.   return 1 month. tests are being ordered for you today.  a few days after the test(s), please call [phone] to hear your test results. check your blood sugar 2 times a day.  vary the time of day when you check, between before the 3 meals, and at bedtime.  also check if you have symptoms of your blood sugar being too high or too low.  please keep a record of the readings and bring it to your next appointment here.  please call us sooner if you are having low blood sugar episodes. It will take approx 1 week for your blood sugar to return to what it was before the steroid injection.  Until then, take an extra 10 units of novolog for any blood sugar over 300, and 5 extra any time it is in the 200's.   (update: i left message on phone-tree:  Reduce nph to 10 units qhs).

## 2010-08-28 ENCOUNTER — Other Ambulatory Visit: Payer: Self-pay | Admitting: *Deleted

## 2010-08-28 LAB — VITAMIN D 25 HYDROXY (VIT D DEFICIENCY, FRACTURES): Vit D, 25-Hydroxy: 59 ng/mL (ref 30–89)

## 2010-08-28 LAB — FRUCTOSAMINE: Fructosamine: 237 umol/L (ref <285)

## 2010-08-28 MED ORDER — LEVOTHYROXINE SODIUM 50 MCG PO TABS: 50.0000 ug | ORAL_TABLET | Freq: Every day | ORAL | Status: DC

## 2010-09-02 ENCOUNTER — Other Ambulatory Visit: Payer: Self-pay | Admitting: *Deleted

## 2010-09-02 MED ORDER — FUROSEMIDE 40 MG PO TABS: 40.0000 mg | ORAL_TABLET | Freq: Every day | ORAL | Status: DC

## 2010-09-02 NOTE — Telephone Encounter (Signed)
R'cd fax from [facility] for refill of Furosemide  Last OV-08/27/2010  Last Filled-05/26/2010

## 2010-09-23 ENCOUNTER — Ambulatory Visit (INDEPENDENT_AMBULATORY_CARE_PROVIDER_SITE_OTHER): Payer: Medicare Other | Admitting: Endocrinology

## 2010-09-23 ENCOUNTER — Encounter: Payer: Self-pay | Admitting: Endocrinology

## 2010-09-23 VITALS — BP 126/70 | HR 94 | Temp 98.1°F | Ht 63.5 in | Wt 240.0 lb

## 2010-09-23 DIAGNOSIS — E042 Nontoxic multinodular goiter: Secondary | ICD-10-CM

## 2010-09-23 DIAGNOSIS — M25569 Pain in unspecified knee: Secondary | ICD-10-CM

## 2010-09-23 DIAGNOSIS — E119 Type 2 diabetes mellitus without complications: Secondary | ICD-10-CM

## 2010-09-23 NOTE — Progress Notes (Signed)
Subjective:    Patient ID: Brittany Acosta, female    DOB: 03-21-1951, 60 y.o.   MRN: [id number]  HPI pt states she feels well in general, except she got a steroid shot into her left knee yesterday.  she brings a record of her cbg's which i have reviewed today.  She checks mostly hs and am.  It is in the 200's at hs, and 100-150 in am.  She says that the scale of extra novolog i advised for her only improved cbg's to the 200's. She has few years of moderate pain at both knees, but no assoc numbness Past Medical History  Diagnosis Date  . GOITER, MULTINODULAR 03/24/2009  . HYPOTHYROIDISM 06/10/2008  . IDDM 06/10/2008  . HYPERCHOLESTEROLEMIA 06/10/2008  . Morbid obesity 09/03/2008  . BIPOLAR DISORDER UNSPECIFIED 06/10/2008  . HYPERTENSION 06/10/2008  . Chronic diastolic heart failure 12/24/2008  . ALLERGIC RHINITIS 06/10/2008  . APHTHOUS STOMATITIS 05/23/2009  . TRANSAMINASES, SERUM, ELEVATED 09/26/2008  . ASYMPTOMATIC POSTMENOPAUSAL STATUS 06/10/2008  . FIBROMYALGIA 03/23/2010    Past Surgical History  Procedure Date  . Cholecystectomy 1985  . Abdominal hysterectomy 1976  . Tubal ligation 1972  . Left ovary removed 1980  . Right nasal surgery 08/1988  . Right sinus removed 08/1989    tooth partial  . Percitania stone removed (l) kidney 1992  . Lithotripsy (l) kidney 1997  . Left knee arthroscopy      Partial medical menisectomy  . Left cystoscopy  1990  . Right knee surgery  08/2009    History   Social History  . Marital Status: Married    Spouse Name: N/A    Number of Children: N/A  . Years of Education: N/A   Occupational History  . Not on file.   Social History Main Topics  . Smoking status: Former Smoker    Quit date: 04/06/1983  . Smokeless tobacco: Not on file   Comment: Married, lives with spouse. Disable- 2 grown kids-6 g-kids  . Alcohol Use: No  . Drug Use: No  . Sexually Active:    Other Topics Concern  . Not on file   Social History Narrative  . No narrative on file      Current Outpatient Prescriptions on File Prior to Visit  Medication Sig Dispense Refill  . Alcohol Swabs (ALCOHOL WIPES) 70 % PADS by Does not apply route. Use as directed        . ARIPiprazole (ABILIFY) 20 MG tablet Take 20 mg by mouth at bedtime.        Marland Kitchen aspirin 81 MG tablet Take 81 mg by mouth at bedtime.       Marland Kitchen atorvastatin (LIPITOR) 20 MG tablet Take 20 mg by mouth daily.       . B-D UF III MINI PEN NEEDLES 31G X 5 MM MISC       . Biotin 5000 MCG CAPS Take 1 capsule by mouth 3 (three) times daily.        . Calcium Carbonate-Vitamin D (CALCIUM 600 + D PO) Take 1 tablet by mouth 2 (two) times daily.        . clonazePAM (KLONOPIN) 1 MG tablet Take 1 mg by mouth at bedtime. Take 2 by mouth at bedtime       . estrogens, conjugated, (PREMARIN) 0.3 MG tablet Take 0.3 mg by mouth daily.       . furosemide (LASIX) 40 MG tablet Take 1 tablet (40 mg total) by mouth daily.  90  tablet  2  . Glucose Blood (ONETOUCH ULTRA BLUE VI) Use 1 strip four times to day to check blood sugars      . hydrochlorothiazide 25 MG tablet Take 25 mg by mouth every morning.        . insulin aspart (NOVOLOG) 100 UNIT/ML injection 3 (three) times daily before meals. 3x a day (just before each meal), 20-5-20 units and 10 units at bedtime      . lamoTRIgine (LAMICTAL) 200 MG tablet Take 200 mg by mouth daily at 12 noon.        Marland Kitchen levothyroxine (SYNTHROID) 50 MCG tablet Take 1 tablet (50 mcg total) by mouth daily.  90 tablet  2  . Magnesium Malate POWD 1000mg  tablet 1 tablet by mouth three times a day with meals       . Manganese Gluconate 50 MG TABS Take 1 tablet by mouth daily.        . metFORMIN (GLUCOPHAGE) 1000 MG tablet Take 1,000 mg by mouth 2 (two) times daily with a meal.        . mirtazapine (REMERON) 30 MG tablet Take 30 mg by mouth at bedtime.        . Multiple Vitamin (MULTIVITAMIN) tablet Take 1 tablet by mouth daily.        . naproxen (NAPROSYN) 500 MG tablet Take 1 tablet (500 mg total) by mouth 2 (two)  times daily with a meal.  180 tablet  1  . oxybutynin (DITROPAN-XL) 10 MG 24 hr tablet Take 10 mg by mouth every evening.        Bertram Gala Glycol-Propyl Glycol (SYSTANE) 0.4-0.3 % SOLN Apply to eye. 3 times daily       . potassium citrate (UROCIT-K 10) 10 MEQ (1080 MG) SR tablet Take 10 mEq by mouth. Take 2 by mouth every morning and 2 in the evening       . QUEtiapine Fumarate (SEROQUEL XR) 150 MG TB24 Take by mouth. Take 1 every morning and 3  At bedtime       . topiramate (TOPAMAX) 100 MG tablet Take 100 mg by mouth 2 (two) times daily.        Marland Kitchen venlafaxine (EFFEXOR-XR) 150 MG 24 hr capsule Take 150 mg by mouth every morning.        Marland Kitchen DISCONTD: Cholecalciferol (VITAMIN D) 1000 UNITS capsule Take 1,000 Units by mouth at bedtime.        Marland Kitchen DISCONTD: insulin NPH (HUMULIN N,NOVOLIN N) 100 UNIT/ML injection Inject 30 Units into the skin at bedtime.        Marland Kitchen DISCONTD: Insulin Syringe-Needle U-100 (B-D INSULIN SYRINGE) 31G X 5/16" 0.3 ML MISC 3 times daily        No Known Allergies  Family History  Problem Relation Age of Onset  . Hypertension Mother   . Diabetes Father   . Hypertension Father   . Hyperlipidemia Father   . Heart attack Other   . Coronary artery disease Other     BP 126/70  Pulse 94  Temp(Src) 98.1 F (36.7 C) (Oral)  Ht 5' 3.5" (1.613 m)  Wt 240 lb (108.863 kg)  BMI 41.85 kg/m2  SpO2 97%  Review of Systems denies hypoglycemia and fever.    Objective:   Physical Exam GENERAL: no distress Neck: the right thyroid lobe now seems 5x normal size--much larger than the left       Assessment & Plan:  DM.  Her cbg record indicates she may not  need nph now oa of the knees.  Steroid injections are increasing cbg's. Multinodular goiter.  Physical exam now suggests right lobe is larger than the left.

## 2010-09-23 NOTE — Patient Instructions (Addendum)
Stop nph insulin. increase novolog to (just before each meal) 20-5-25 units.  if you eat a bedtime snack, take 4 units of humalog for it.   return 6 weeks. check your blood sugar 2 times a day.  vary the time of day when you check, between before the 3 meals, and at bedtime.  also check if you have symptoms of your blood sugar being too high or too low.  please keep a record of the readings and bring it to your next appointment here.  please call us sooner if you are having low blood sugar episodes. It will take approx 1 week for your blood sugar to return to what it was before the steroid injection.  Until then, take an extra 15 units of novolog for any blood sugar over 300, and 10 extra any time it is in the 200's.   Let's recheck the thyroid ultrasound.  you will be called with a day and time for an appointment.  Then please call [phone] to hear your test results.  You will be prompted to enter the 9-digit "MRN" number that appears at the top left of this page, followed by #.  Then you will hear the message.

## 2010-09-29 ENCOUNTER — Ambulatory Visit
Admission: RE | Admit: 2010-09-29 | Discharge: 2010-09-29 | Disposition: A | Discharge status: Home / Self Care | Payer: Medicare Other | Source: Ambulatory Visit | Attending: Endocrinology | Admitting: Endocrinology

## 2010-09-29 DIAGNOSIS — E042 Nontoxic multinodular goiter: Secondary | ICD-10-CM

## 2010-10-26 ENCOUNTER — Telehealth: Payer: Self-pay

## 2010-10-26 NOTE — Telephone Encounter (Signed)
Pt advised and states she has been wearing DM shoes for 10 years and has other Dx associated. Pt will provide documentation from Podiatrist for [facility] to review as soon as possible.

## 2010-10-26 NOTE — Telephone Encounter (Signed)
Pt called stating she was advised by her Podiatrist Dr Stevan Born, that request for DM shoes was denied by [facility]. Pt is would like to know why/if she does not qualify?

## 2010-10-26 NOTE — Telephone Encounter (Signed)
Medicare has peculiar criteria that someone has to meet, in addition to having dm.  Fortunately, you do not meet any of these criteria.

## 2010-10-27 ENCOUNTER — Other Ambulatory Visit: Payer: Self-pay | Admitting: *Deleted

## 2010-10-27 MED ORDER — ATORVASTATIN CALCIUM 20 MG PO TABS: 20.0000 mg | ORAL_TABLET | Freq: Every day | ORAL | Status: DC

## 2010-11-02 ENCOUNTER — Ambulatory Visit: Payer: Medicare Other | Admitting: Endocrinology

## 2010-11-25 ENCOUNTER — Telehealth: Payer: Self-pay

## 2010-11-25 NOTE — Telephone Encounter (Signed)
Pt called stating she is out of town and has had MA x 3 days. Pt is requesting MD [facility]. I advised pt that she would need to be evaluated prior to treatment and since she is out of town she may want to consider local UC. Pt agreed.

## 2010-12-08 ENCOUNTER — Other Ambulatory Visit: Payer: Self-pay | Admitting: Internal Medicine

## 2010-12-14 ENCOUNTER — Encounter: Payer: Self-pay | Admitting: Endocrinology

## 2010-12-14 ENCOUNTER — Other Ambulatory Visit (INDEPENDENT_AMBULATORY_CARE_PROVIDER_SITE_OTHER): Payer: Medicare Other

## 2010-12-14 ENCOUNTER — Ambulatory Visit (INDEPENDENT_AMBULATORY_CARE_PROVIDER_SITE_OTHER): Payer: Medicare Other | Admitting: Internal Medicine

## 2010-12-14 ENCOUNTER — Encounter: Payer: Self-pay | Admitting: Internal Medicine

## 2010-12-14 ENCOUNTER — Ambulatory Visit: Payer: Medicare Other | Admitting: Endocrinology

## 2010-12-14 ENCOUNTER — Ambulatory Visit: Payer: Medicare Other | Admitting: Internal Medicine

## 2010-12-14 ENCOUNTER — Ambulatory Visit (INDEPENDENT_AMBULATORY_CARE_PROVIDER_SITE_OTHER): Payer: Medicare Other | Admitting: Endocrinology

## 2010-12-14 VITALS — BP 112/72 | HR 92 | Temp 98.2°F | Ht 63.5 in | Wt 246.0 lb

## 2010-12-14 DIAGNOSIS — E109 Type 1 diabetes mellitus without complications: Secondary | ICD-10-CM

## 2010-12-14 DIAGNOSIS — R51 Headache: Secondary | ICD-10-CM

## 2010-12-14 DIAGNOSIS — E039 Hypothyroidism, unspecified: Secondary | ICD-10-CM

## 2010-12-14 LAB — HEMOGLOBIN A1C: Hgb A1c MFr Bld: 7.6 % — ABNORMAL HIGH (ref 4.6–6.5)

## 2010-12-14 MED ORDER — GABAPENTIN 300 MG PO CAPS: 300.0000 mg | ORAL_CAPSULE | Freq: Every day | ORAL | Status: DC

## 2010-12-14 MED ORDER — AMITRIPTYLINE HCL 10 MG PO TABS: 10.0000 mg | ORAL_TABLET | Freq: Every day | ORAL | Status: DC

## 2010-12-14 MED ORDER — HYDROCODONE-ACETAMINOPHEN 5-500 MG PO TABS: 1.0000 | ORAL_TABLET | ORAL | Status: DC | PRN

## 2010-12-14 NOTE — Progress Notes (Signed)
Subjective:    Patient ID: Brittany Acosta, female    DOB: 1951/01/07, 60 y.o.   MRN: [id number]  HPI pt states she feels well in general, except for chronic arthralgias.  Pt states she thinks cbg's have been mildly low twice since last ov.  She did not check cbg then.  Both episodes were after the evening meal.  It is highest in am.   she brings a record of her cbg's which i have reviewed today. Past Medical History  Diagnosis Date  . GOITER, MULTINODULAR 03/24/2009  . HYPOTHYROIDISM 06/10/2008  . IDDM 06/10/2008  . HYPERCHOLESTEROLEMIA 06/10/2008  . Morbid obesity 09/03/2008  . BIPOLAR DISORDER UNSPECIFIED 06/10/2008  . HYPERTENSION 06/10/2008  . Chronic diastolic heart failure 12/24/2008  . ALLERGIC RHINITIS 06/10/2008  . APHTHOUS STOMATITIS 05/23/2009  . TRANSAMINASES, SERUM, ELEVATED 09/26/2008  . ASYMPTOMATIC POSTMENOPAUSAL STATUS 06/10/2008  . FIBROMYALGIA 03/23/2010    Past Surgical History  Procedure Date  . Cholecystectomy 1985  . Abdominal hysterectomy 1976  . Tubal ligation 1972  . Left ovary removed 1980  . Right nasal surgery 08/1988  . Right sinus removed 08/1989    tooth partial  . Percitania stone removed (l) kidney 1992  . Lithotripsy (l) kidney 1997  . Left knee arthroscopy      Partial medical menisectomy  . Left cystoscopy  1990  . Right knee surgery  08/2009    History   Social History  . Marital Status: Married    Spouse Name: N/A    Number of Children: N/A  . Years of Education: N/A   Occupational History  . Not on file.   Social History Main Topics  . Smoking status: Former Smoker    Quit date: 04/06/1983  . Smokeless tobacco: Not on file   Comment: Married, lives with spouse. Disable- 2 grown kids-6 g-kids  . Alcohol Use: No  . Drug Use: No  . Sexually Active:    Other Topics Concern  . Not on file   Social History Narrative  . No narrative on file    Current Outpatient Prescriptions on File Prior to Visit  Medication Sig Dispense Refill  .  Alcohol Swabs (ALCOHOL WIPES) 70 % PADS by Does not apply route. Use as directed        . ARIPiprazole (ABILIFY) 20 MG tablet Take 20 mg by mouth at bedtime.        Marland Kitchen aspirin 81 MG tablet Take 81 mg by mouth at bedtime.       Marland Kitchen atorvastatin (LIPITOR) 20 MG tablet Take 1 tablet (20 mg total) by mouth daily.  90 tablet  3  . B-D UF III MINI PEN NEEDLES 31G X 5 MM MISC       . Biotin 5000 MCG CAPS Take 1 capsule by mouth 3 (three) times daily.        . Calcium Carbonate-Vitamin D (CALCIUM 600 + D PO) Take 1 tablet by mouth 2 (two) times daily.        . Cholecalciferol (VITAMIN D) 2000 UNITS CAPS Take 1 capsule by mouth at bedtime.        . clonazePAM (KLONOPIN) 1 MG tablet Take 1 mg by mouth at bedtime. Take 2 by mouth at bedtime       . Coenzyme Q10 (CO Q 10) 60 MG CAPS Take 1 capsule by mouth 3 (three) times daily.        Marland Kitchen estrogens, conjugated, (PREMARIN) 0.3 MG tablet Take 0.3 mg by  mouth daily.       . furosemide (LASIX) 40 MG tablet Take 1 tablet (40 mg total) by mouth daily.  90 tablet  2  . Glucose Blood (ONETOUCH ULTRA BLUE VI) Use 1 strip four times to day to check blood sugars      . hydrochlorothiazide 25 MG tablet Take 25 mg by mouth every morning.        . insulin aspart (NOVOLOG) 100 UNIT/ML injection 3 (three) times daily before meals. 3x a day (just before each meal), 20-5-20 units and 10 units at bedtime      . lamoTRIgine (LAMICTAL) 200 MG tablet Take 200 mg by mouth daily at 12 noon.        Marland Kitchen levothyroxine (SYNTHROID) 50 MCG tablet Take 1 tablet (50 mcg total) by mouth daily.  90 tablet  2  . Magnesium Malate POWD 1000mg  tablet 1 tablet by mouth three times a day with meals       . Manganese Gluconate 50 MG TABS Take 1 tablet by mouth daily.        . metFORMIN (GLUCOPHAGE) 1000 MG tablet Take 1,000 mg by mouth 2 (two) times daily with a meal.        . mirtazapine (REMERON) 30 MG tablet Take 30 mg by mouth at bedtime.        . Multiple Vitamin (MULTIVITAMIN) tablet Take 1  tablet by mouth daily.        . naproxen (NAPROSYN) 500 MG tablet TAKE 1 TABLET TWICE A DAY  WITH MEALS  180 tablet  1  . oxybutynin (DITROPAN-XL) 10 MG 24 hr tablet Take 10 mg by mouth every evening.        Bertram Gala Glycol-Propyl Glycol (SYSTANE) 0.4-0.3 % SOLN Apply to eye. 3 times daily       . potassium citrate (UROCIT-K 10) 10 MEQ (1080 MG) SR tablet Take 10 mEq by mouth. Take 2 by mouth every morning and 2 in the evening       . QUEtiapine Fumarate (SEROQUEL XR) 150 MG TB24 Take by mouth. Take 1 every morning and 3  At bedtime       . Thiamine HCl (VITAMIN B-1) 100 MG tablet Take 100 mg by mouth daily.        Marland Kitchen topiramate (TOPAMAX) 100 MG tablet Take 100 mg by mouth 2 (two) times daily.        Marland Kitchen venlafaxine (EFFEXOR-XR) 150 MG 24 hr capsule Take 150 mg by mouth every morning.          No Known Allergies  Family History  Problem Relation Age of Onset  . Hypertension Mother   . Diabetes Father   . Hypertension Father   . Hyperlipidemia Father   . Heart attack Other   . Coronary artery disease Other     BP 112/72  Pulse 92  Temp(Src) 98.2 F (36.8 C) (Oral)  Ht 5' 3.5" (1.613 m)  Wt 246 lb (111.585 kg)  BMI 42.89 kg/m2  SpO2 96%  Review of Systems Denies loc    Objective:   Physical Exam VITAL SIGNS:  See vs page GENERAL: no distress.  Obese SKIN: Insulin injection sites at the anterior abdomen are normal Pulses: dorsalis pedis intact bilat.   Feet: no deformity.  no ulcer on the feet.  feet are of normal color and temp.  trace bilat leg edema.  There is bilteral onychomycosis.  No callus.   Neuro: sensation is intact to touch on  the feet, but decreased from normal.     Assessment & Plan:  Dm.  She needs less supper novolog.  She may also need to resume the nph Pt has peripheral neuropathy, but no callus.  She does not qualify for dm shoes.

## 2010-12-14 NOTE — Patient Instructions (Addendum)
blood tests are being requested for you today.  please call [phone] to hear your test results.  You will be prompted to enter the 9-digit "MRN" number that appears at the top left of this page, followed by #.  Then you will hear the message. pending the test results, please decrease novolog to (just before each meal) 20-5-20 units.  if you eat a bedtime snack, take 4 units of humalog for it.   return 6 weeks. check your blood sugar 2 times a day.  vary the time of day when you check, between before the 3 meals, and at bedtime.  also check if you have symptoms of your blood sugar being too high or too low.  please keep a record of the readings and bring it to your next appointment here.  please call us sooner if you are having low blood sugar episodes. Please make a follow-up appointment in 4 months.

## 2010-12-14 NOTE — Assessment & Plan Note (Signed)
Check TSH now, adjust as needed The current medical regimen is effective;  continue present plan and medications. Lab Results  Component Value Date   TSH 0.82 08/27/2010

## 2010-12-14 NOTE — Assessment & Plan Note (Signed)
Reports increasing peripheral neuropathy symptoms Follows with podiatry and endo for same -  Start gabapentin as well as elavil  - erx done

## 2010-12-14 NOTE — Progress Notes (Signed)
Subjective:    Patient ID: Brittany Acosta, female    DOB: [birth year], 60 y.o.   MRN: [id number]  HPI  here for follow up - reviewed chronic medical issues:  allg rhinitis - exacerbation when out antihistamine pills - not using nasal steroid regularly -   DM2 - follows with endo for same. reports compliance with ongoing medical treatment and no changes in medication dose or frequency. denies adverse side effects related to current therapy.  home cbg log reviewed - no hypoglycemia events - complains of feet tingling and pain - working with podiatry for same  HTN - reports compliance with ongoing medical treatment and no changes in medication dose or frequency. denies adverse side effects related to current therapy.   dyslipidemia- reports compliance with ongoing medical treatment and no changes in medication dose or frequency. denies adverse side effects related to current therapy.   hypothyroid - reports compliance with ongoing medical treatment and no changes in medication dose or frequency. denies adverse side effects related to current therapy.   Past Medical History  Diagnosis Date  . GOITER, MULTINODULAR   . HYPOTHYROIDISM   . IDDM   . HYPERCHOLESTEROLEMIA   . Morbid obesity   . BIPOLAR DISORDER UNSPECIFIED   . HYPERTENSION   . Chronic diastolic heart failure   . ALLERGIC RHINITIS   . APHTHOUS STOMATITIS   . TRANSAMINASES, SERUM, ELEVATED   . ASYMPTOMATIC POSTMENOPAUSAL STATUS   . FIBROMYALGIA     Review of Systems  Constitutional: Negative for fever and unexpected weight change.  Respiratory: Negative for cough.   Cardiovascular: Negative for chest pain.  Neurological: Positive for headaches.      Objective:   Physical Exam  BP 112/72  Pulse 92  Temp(Src) 98.2 F (36.8 C) (Oral)  Ht 5\' 3"  (1.6 m)  Wt 246 lb (111.585 kg)  BMI 43.58 kg/m2  SpO2 96%     Wt Readings from Last 3 Encounters:  12/14/10 246 lb (111.585 kg)  12/14/10 246 lb (111.585 kg)  09/23/10  240 lb (108.863 kg)    Constitutional: She is overweight. She appears well-developed and well-nourished. No distress.  Neck: Normal range of motion. Neck supple. No JVD present. No thyromegaly present.  Cardiovascular: Normal rate, regular rhythm and normal heart sounds.  No murmur heard. no BLE edema Pulmonary/Chest: Effort normal and breath sounds normal. No respiratory distress. She has no wheezes.  Musculoskeletal: No joint effusions, B knee - boggy synovitis - tender to palpation over joint line; FROM and ligamentous function intact Neurological: She is alert and oriented to person, place, and time. No cranial nerve deficit. MAE well; Coordination and balance normal.   Lab Results  Component Value Date   WBC 9.7 10/29/2008   HGB 13.9 08/11/2009   HCT 41.0 08/11/2009   PLT 385.0 10/29/2008   CHOL 122 03/23/2010   TRIG 205.0* 03/23/2010   HDL 43.50 03/23/2010   LDLDIRECT 52.3 03/23/2010   ALT 28 03/23/2010   AST 31 03/23/2010   NA 140 03/23/2010   K 3.9 03/23/2010   CL 99 03/23/2010   CREATININE 0.9 03/23/2010   BUN 16 03/23/2010   CO2 30 03/23/2010   TSH 0.82 08/27/2010   HGBA1C 9.5* 07/08/2010   MICROALBUR 0.2 12/24/2008   Assessment & Plan:  See problem list. Medications and labs reviewed today.  Headache x 1 month - tension type but reports remote migraines - neuro exam benign, already on NSAIDs for knee OA - will provide few vicodin  and start elavil for sleep to help with same

## 2010-12-14 NOTE — Patient Instructions (Signed)
It was good to see you today. Test(s) ordered today. Your results will be called to you after review (48-72hours after test completion). If any changes need to be made, you will be notified at that time. Start gabapentin and amitriptyline at bedtime to help headache and feet burning symptoms - Your prescription(s) have been submitted to your pharmacy. Please take as directed and contact our office if you believe you are having problem(s) with the medication(s). Also few vicodin to use as needed for severe pain symptoms  Please schedule followup in 6 months, call sooner if problems.

## 2010-12-15 LAB — TSH: TSH: 1.11 u[IU]/mL (ref 0.35–5.50)

## 2010-12-31 ENCOUNTER — Other Ambulatory Visit: Payer: Self-pay | Admitting: Endocrinology

## 2011-01-05 ENCOUNTER — Other Ambulatory Visit: Payer: Self-pay | Admitting: Internal Medicine

## 2011-01-11 ENCOUNTER — Telehealth: Payer: Self-pay

## 2011-01-11 ENCOUNTER — Encounter: Payer: Self-pay | Admitting: *Deleted

## 2011-01-11 NOTE — Telephone Encounter (Signed)
Pt calling to inform MDs that she is scheduled to have bilateral cataract surgery on 01/18/2011

## 2011-01-11 NOTE — Telephone Encounter (Signed)
Thank you.  Skip novolog for any skipped meal.

## 2011-01-11 NOTE — Telephone Encounter (Signed)
Pt informed of MD's advisement. 

## 2011-01-26 ENCOUNTER — Telehealth: Payer: Self-pay | Admitting: *Deleted

## 2011-01-26 DIAGNOSIS — Z1211 Encounter for screening for malignant neoplasm of colon: Secondary | ICD-10-CM

## 2011-01-26 NOTE — Telephone Encounter (Signed)
Notified pt with md response...01/26/11@4 :13pm/LMB

## 2011-01-26 NOTE — Telephone Encounter (Signed)
Order placed as requested for screen colo With Dr. Loreta Ave

## 2011-01-26 NOTE — Telephone Encounter (Signed)
Left msg on on vm stating need colonoscopy last ine was done 05/27/97...01/26/11@3 :52pm/LMB

## 2011-02-04 HISTORY — PX: CATARACT EXTRACTION, BILATERAL: SHX1313

## 2011-02-06 ENCOUNTER — Emergency Department (HOSPITAL_COMMUNITY): Payer: Medicare Other

## 2011-02-06 ENCOUNTER — Emergency Department (HOSPITAL_COMMUNITY)
Admission: EM | Admit: 2011-02-06 | Discharge: 2011-02-06 | Disposition: A | Discharge status: Home / Self Care | Payer: Medicare Other | Attending: Emergency Medicine | Admitting: Emergency Medicine

## 2011-02-06 ENCOUNTER — Other Ambulatory Visit: Payer: Self-pay

## 2011-02-06 DIAGNOSIS — R5381 Other malaise: Secondary | ICD-10-CM | POA: Insufficient documentation

## 2011-02-06 DIAGNOSIS — R059 Cough, unspecified: Secondary | ICD-10-CM | POA: Insufficient documentation

## 2011-02-06 DIAGNOSIS — I10 Essential (primary) hypertension: Secondary | ICD-10-CM | POA: Insufficient documentation

## 2011-02-06 DIAGNOSIS — K589 Irritable bowel syndrome without diarrhea: Secondary | ICD-10-CM | POA: Insufficient documentation

## 2011-02-06 DIAGNOSIS — R05 Cough: Secondary | ICD-10-CM | POA: Insufficient documentation

## 2011-02-06 DIAGNOSIS — R0602 Shortness of breath: Secondary | ICD-10-CM | POA: Insufficient documentation

## 2011-02-06 DIAGNOSIS — R0609 Other forms of dyspnea: Secondary | ICD-10-CM | POA: Insufficient documentation

## 2011-02-06 DIAGNOSIS — Z794 Long term (current) use of insulin: Secondary | ICD-10-CM | POA: Insufficient documentation

## 2011-02-06 DIAGNOSIS — E119 Type 2 diabetes mellitus without complications: Secondary | ICD-10-CM | POA: Insufficient documentation

## 2011-02-06 DIAGNOSIS — J069 Acute upper respiratory infection, unspecified: Secondary | ICD-10-CM | POA: Insufficient documentation

## 2011-02-06 DIAGNOSIS — R5383 Other fatigue: Secondary | ICD-10-CM | POA: Insufficient documentation

## 2011-02-06 DIAGNOSIS — Z7982 Long term (current) use of aspirin: Secondary | ICD-10-CM | POA: Insufficient documentation

## 2011-02-06 DIAGNOSIS — R0989 Other specified symptoms and signs involving the circulatory and respiratory systems: Secondary | ICD-10-CM | POA: Insufficient documentation

## 2011-02-06 DIAGNOSIS — Z79899 Other long term (current) drug therapy: Secondary | ICD-10-CM | POA: Insufficient documentation

## 2011-02-06 DIAGNOSIS — R062 Wheezing: Secondary | ICD-10-CM | POA: Insufficient documentation

## 2011-02-06 DIAGNOSIS — R079 Chest pain, unspecified: Secondary | ICD-10-CM | POA: Insufficient documentation

## 2011-02-06 DIAGNOSIS — F319 Bipolar disorder, unspecified: Secondary | ICD-10-CM | POA: Insufficient documentation

## 2011-02-06 LAB — BASIC METABOLIC PANEL
BUN: 13 mg/dL (ref 6–23)
CO2: 27 mEq/L (ref 19–32)
Calcium: 9.1 mg/dL (ref 8.4–10.5)
Chloride: 93 mEq/L — ABNORMAL LOW (ref 96–112)
Creatinine, Ser: 0.82 mg/dL (ref 0.50–1.10)
GFR calc Af Amer: 88 mL/min — ABNORMAL LOW (ref >90)
GFR calc non Af Amer: 76 mL/min — ABNORMAL LOW (ref >90)
Glucose, Bld: 229 mg/dL — ABNORMAL HIGH (ref 70–99)
Potassium: 3.4 mEq/L — ABNORMAL LOW (ref 3.5–5.1)
Sodium: 133 mEq/L — ABNORMAL LOW (ref 135–145)

## 2011-02-06 LAB — CBC
HCT: 33.3 % — ABNORMAL LOW (ref 36.0–46.0)
Hemoglobin: 10.8 g/dL — ABNORMAL LOW (ref 12.0–15.0)
MCH: 24.1 pg — ABNORMAL LOW (ref 26.0–34.0)
MCHC: 32.4 g/dL (ref 30.0–36.0)
MCV: 74.2 fL — ABNORMAL LOW (ref 78.0–100.0)
Platelets: 420 10*3/uL — ABNORMAL HIGH (ref 150–400)
RBC: 4.49 MIL/uL (ref 3.87–5.11)
RDW: 16.9 % — ABNORMAL HIGH (ref 11.5–15.5)
WBC: 14.8 10*3/uL — ABNORMAL HIGH (ref 4.0–10.5)

## 2011-02-06 LAB — DIFFERENTIAL
Basophils Absolute: 0.1 10*3/uL (ref 0.0–0.1)
Basophils Relative: 0 % (ref 0–1)
Eosinophils Absolute: 0.4 10*3/uL (ref 0.0–0.7)
Eosinophils Relative: 3 % (ref 0–5)
Lymphocytes Relative: 13 % (ref 12–46)
Lymphs Abs: 1.9 10*3/uL (ref 0.7–4.0)
Monocytes Absolute: 0.4 10*3/uL (ref 0.1–1.0)
Monocytes Relative: 3 % (ref 3–12)
Neutro Abs: 12.1 10*3/uL — ABNORMAL HIGH (ref 1.7–7.7)
Neutrophils Relative %: 82 % — ABNORMAL HIGH (ref 43–77)

## 2011-02-06 LAB — POCT I-STAT, CHEM 8
BUN: 13 mg/dL (ref 6–23)
Calcium, Ion: 1.12 mmol/L (ref 1.12–1.32)
Chloride: 95 mEq/L — ABNORMAL LOW (ref 96–112)
Creatinine, Ser: 0.8 mg/dL (ref 0.50–1.10)
Glucose, Bld: 230 mg/dL — ABNORMAL HIGH (ref 70–99)
HCT: 36 % (ref 36.0–46.0)
Hemoglobin: 12.2 g/dL (ref 12.0–15.0)
Potassium: 3.4 mEq/L — ABNORMAL LOW (ref 3.5–5.1)
Sodium: 133 mEq/L — ABNORMAL LOW (ref 135–145)
TCO2: 25 mmol/L (ref 0–100)

## 2011-02-06 MED ORDER — IOHEXOL 300 MG/ML  SOLN: 100.0000 mL | Freq: Once | INTRAMUSCULAR | Status: DC | PRN

## 2011-02-06 MED ORDER — IOHEXOL 350 MG/ML SOLN
100.0000 mL | Freq: Once | INTRAVENOUS | Status: AC | PRN
  Administered 2011-02-06: 100 mL via INTRAVENOUS

## 2011-02-09 ENCOUNTER — Ambulatory Visit (INDEPENDENT_AMBULATORY_CARE_PROVIDER_SITE_OTHER): Payer: Medicare Other | Admitting: Cardiology

## 2011-02-09 ENCOUNTER — Telehealth: Payer: Self-pay

## 2011-02-09 ENCOUNTER — Ambulatory Visit: Payer: Medicare Other | Admitting: Internal Medicine

## 2011-02-09 ENCOUNTER — Encounter: Payer: Self-pay | Admitting: Internal Medicine

## 2011-02-09 ENCOUNTER — Ambulatory Visit (INDEPENDENT_AMBULATORY_CARE_PROVIDER_SITE_OTHER): Payer: Medicare Other | Admitting: Internal Medicine

## 2011-02-09 ENCOUNTER — Encounter: Payer: Self-pay | Admitting: Cardiology

## 2011-02-09 VITALS — BP 110/72 | HR 78 | Ht 63.0 in | Wt 246.4 lb

## 2011-02-09 VITALS — BP 120/72 | HR 90 | Temp 98.6°F

## 2011-02-09 DIAGNOSIS — E785 Hyperlipidemia, unspecified: Secondary | ICD-10-CM

## 2011-02-09 DIAGNOSIS — R0600 Dyspnea, unspecified: Secondary | ICD-10-CM | POA: Insufficient documentation

## 2011-02-09 DIAGNOSIS — R0609 Other forms of dyspnea: Secondary | ICD-10-CM

## 2011-02-09 DIAGNOSIS — E119 Type 2 diabetes mellitus without complications: Secondary | ICD-10-CM

## 2011-02-09 DIAGNOSIS — R062 Wheezing: Secondary | ICD-10-CM

## 2011-02-09 DIAGNOSIS — IMO0001 Reserved for inherently not codable concepts without codable children: Secondary | ICD-10-CM

## 2011-02-09 DIAGNOSIS — J069 Acute upper respiratory infection, unspecified: Secondary | ICD-10-CM

## 2011-02-09 DIAGNOSIS — K219 Gastro-esophageal reflux disease without esophagitis: Secondary | ICD-10-CM | POA: Insufficient documentation

## 2011-02-09 DIAGNOSIS — R0789 Other chest pain: Secondary | ICD-10-CM | POA: Insufficient documentation

## 2011-02-09 DIAGNOSIS — R0989 Other specified symptoms and signs involving the circulatory and respiratory systems: Secondary | ICD-10-CM

## 2011-02-09 MED ORDER — ALBUTEROL 90 MCG/ACT IN AERS: 2.0000 | INHALATION_SPRAY | Freq: Four times a day (QID) | RESPIRATORY_TRACT | Status: DC | PRN

## 2011-02-09 MED ORDER — OMEPRAZOLE 20 MG PO CPDR: 20.0000 mg | DELAYED_RELEASE_CAPSULE | Freq: Two times a day (BID) | ORAL | Status: DC

## 2011-02-09 NOTE — Telephone Encounter (Signed)
[facility] Triage Call Report Triage Record Num: [id number] Operator: Lesli Albee Patient Name: Brittany Acosta Call Date & Time: 02/06/2011 6:34:48PM Patient Phone: [phone] PCP: Romero Belling Patient Gender: Female PCP Fax : [phone] Patient DOB: [birth year] Practice Name: [facility] Reason for Call: wheezing and coughing and sinuses are hurting, chest is hurting, chest is rattling and can hear it over the phone THE PATIENT REFUSED 911 Pt is calling about a cough worsening. Tonight she can hear her chest rattling and she is wheezing. Rn triaged and advised ED. Pt will go to [facility]. Protocol(s) Used: Cough - Adult Recommended Outcome per Protocol: See ED Immediately Reason for Outcome: Sudden onset of shortness of breath, difficulty breathing, chest pain OR cough with blood tinged sputum. Care Advice: ~ 02/06/2011 7:35:08PM Page 1 of 1 [id number]  Appt scheduled for today with VAL

## 2011-02-09 NOTE — Patient Instructions (Signed)
We will schedule you for an echocardiogram and a nuclear stress test.   I recommend you use the albuterol inhaler every 6 hours for wheezing.  I recommend you schedule a visit with Dr. Bayard Hugger for your asthma.

## 2011-02-09 NOTE — Progress Notes (Signed)
  Subjective:    Patient ID: Brittany Acosta, female    DOB: [birth year], 60 y.o.   MRN: [id number]  HPI multiple concerns:   Cough - onset 3 days ago - no fever or sputum- seen at ER 3 days ago>> no abx, but using cough syrup at night - improved, but ongoing wheeze   GERD - ?uncontrolled per anesthesia following cataract surgery 2 weeks ago - takes OTC meds for same but unrelieved substernal burning. Symptoms worse with supine position.  Past Medical History  Diagnosis Date  . GOITER, MULTINODULAR   . HYPOTHYROIDISM   . IDDM   . HYPERCHOLESTEROLEMIA   . Morbid obesity   . BIPOLAR DISORDER UNSPECIFIED   . HYPERTENSION   . Chronic diastolic heart failure   . ALLERGIC RHINITIS   . APHTHOUS STOMATITIS   . TRANSAMINASES, SERUM, ELEVATED   . ASYMPTOMATIC POSTMENOPAUSAL STATUS   . FIBROMYALGIA     Review of Systems  Constitutional: Negative for fever and fatigue.  HENT: Negative for sinus pressure.   Respiratory: Positive for cough, shortness of breath and wheezing.   Cardiovascular: Negative for chest pain and palpitations.  Neurological: Negative for dizziness and headaches.       Objective:   Physical Exam BP 120/72  Pulse 90  Temp(Src) 98.6 F (37 C) (Oral)  SpO2 94% Constitutional: She is obese; but appears well-developed and well-nourished. No distress.  Neck: Thick; Normal range of motion. Neck supple. No JVD present. No thyromegaly present.  Cardiovascular: Normal rate, regular rhythm and normal heart sounds.  No murmur heard. No BLE edema. Pulmonary/Chest: Effort normal and breath sounds normal. No respiratory distress. She has end exp wheezes and psuedowheeze/VCD.  Psychiatric: She has a normal mood and affect. Her behavior is normal. Judgment and thought content normal.   Lab Results  Component Value Date   WBC 14.8* 02/06/2011   HGB 12.2 02/06/2011   HCT 36.0 02/06/2011   PLT 420* 02/06/2011   GLUCOSE 230* 02/06/2011   CHOL 122 03/23/2010   TRIG 205.0* 03/23/2010     HDL 43.50 03/23/2010   LDLDIRECT 52.3 03/23/2010   LDLCALC 42 09/03/2008   ALT 28 03/23/2010   AST 31 03/23/2010   NA 133* 02/06/2011   K 3.4* 02/06/2011   CL 95* 02/06/2011   CREATININE 0.80 02/06/2011   BUN 13 02/06/2011   CO2 27 02/06/2011   TSH 1.11 12/14/2010   HGBA1C 7.6* 12/14/2010   MICROALBUR 0.2 12/24/2008        Assessment & Plan:   URI/pseudowheeze - seen in ER for same last 72h - reviewed labs/tx - continue antitussive and rx Alb MDI due to pseudowheeze, no role for antibiotics - also tx for GERD as below  GERD - start PPI - BID x 2 weeks, then qd

## 2011-02-09 NOTE — Progress Notes (Signed)
Brittany Acosta Date of Birth: 1951/03/18 Medical Record #[id number]  History of Present Illness: Brittany Acosta is seen today at the request of the emergency department for evaluation of dyspnea and atypical chest pain. She is a 60 year old white female who has multiple cardiac risk factors including diabetes, hypertension, and hyperlipidemia. She reports prior cardiac catheterization in 2001 which apparently was unremarkable. She has a very strong family history of early coronary disease. Over the past year she reports symptoms of increasing dyspnea. This is worse with exertion. This past weekend she presented to the emergency department with increasing cough and shortness of breath. Significant wheezing was noted on exam. Chest x-ray showed no active disease. She was prescribed an inhaler and cough medicine and discharged home. She has had occasional chest pain every once in a while describes a stinging and burning sensation and usually occurring at rest.  Current Outpatient Prescriptions on File Prior to Visit  Medication Sig Dispense Refill  . albuterol (PROVENTIL,VENTOLIN) 90 MCG/ACT inhaler Inhale 2 puffs into the lungs every 6 (six) hours as needed for wheezing or shortness of breath.  17 g  0  . Alcohol Swabs (ALCOHOL WIPES) 70 % PADS by Does not apply route. Use as directed        . ARIPiprazole (ABILIFY) 20 MG tablet Take 20 mg by mouth at bedtime.        Marland Kitchen aspirin 81 MG tablet Take 81 mg by mouth at bedtime.       Marland Kitchen atorvastatin (LIPITOR) 20 MG tablet Take 1 tablet (20 mg total) by mouth daily.  90 tablet  3  . B-D UF III MINI PEN NEEDLES 31G X 5 MM MISC USE AS DIRECTED 3 TIMES A DAY DX 250.01  300 each  3  . Biotin 5000 MCG CAPS Take 1 capsule by mouth 3 (three) times daily.        . Calcium Carbonate-Vitamin D (CALCIUM 600 + D PO) Take 1 tablet by mouth 2 (two) times daily.        . Cholecalciferol (VITAMIN D) 2000 UNITS CAPS Take 1 capsule by mouth at bedtime.        . clonazePAM  (KLONOPIN) 1 MG tablet Take 1 mg by mouth at bedtime. Take 2 by mouth at bedtime       . Coenzyme Q10 (CO Q 10) 60 MG CAPS Take 1 capsule by mouth 3 (three) times daily.        . furosemide (LASIX) 40 MG tablet Take 1 tablet (40 mg total) by mouth daily.  90 tablet  2  . Glucose Blood (ONETOUCH ULTRA BLUE VI) Use 1 strip four times to day to check blood sugars      . hydrochlorothiazide 25 MG tablet Take 25 mg by mouth every morning.        . indomethacin (INDOCIN SR) 75 MG CR capsule       . insulin aspart (NOVOLOG) 100 UNIT/ML injection 3 (three) times daily before meals. 3x a day (just before each meal), 20-5-20 units and 10 units at bedtime      . lamoTRIgine (LAMICTAL) 200 MG tablet Take 200 mg by mouth daily at 12 noon.        Marland Kitchen levothyroxine (SYNTHROID) 50 MCG tablet Take 1 tablet (50 mcg total) by mouth daily.  90 tablet  2  . Magnesium Malate POWD 1000mg  tablet 1 tablet by mouth three times a day with meals       . Manganese Gluconate  50 MG TABS Take 1 tablet by mouth daily.        . metFORMIN (GLUCOPHAGE) 1000 MG tablet Take 1,000 mg by mouth 2 (two) times daily with a meal.        . mirtazapine (REMERON) 30 MG tablet Take 30 mg by mouth at bedtime.        . Multiple Vitamin (MULTIVITAMIN) tablet Take 1 tablet by mouth daily.        . naproxen (NAPROSYN) 500 MG tablet TAKE 1 TABLET TWICE A DAY  WITH MEALS  180 tablet  1  . NON FORMULARY Garcinia Cambogla 400 mg take 2 tabs daily       . oxybutynin (DITROPAN-XL) 10 MG 24 hr tablet Take 10 mg by mouth every evening.        Bertram Gala Glycol-Propyl Glycol (SYSTANE) 0.4-0.3 % SOLN Apply to eye. 3 times daily       . potassium citrate (UROCIT-K 10) 10 MEQ (1080 MG) SR tablet Take 10 mEq by mouth. Take 2 by mouth every morning and 2 in the evening       . PREMARIN 0.3 MG tablet TAKE 1 TABLET DAILY  90 tablet  1  . QUEtiapine Fumarate (SEROQUEL XR) 150 MG TB24 Take by mouth. Take 1 every morning and 3  At bedtime       . Thiamine HCl  (VITAMIN B-1) 100 MG tablet Take 100 mg by mouth daily.        Marland Kitchen topiramate (TOPAMAX) 100 MG tablet Take 100 mg by mouth 2 (two) times daily.        Marland Kitchen venlafaxine (EFFEXOR-XR) 150 MG 24 hr capsule Take 150 mg by mouth every morning.        Marland Kitchen DISCONTD: LIPITOR 20 MG tablet Take 1 by mouth daily        No Known Allergies  Past Medical History  Diagnosis Date  . GOITER, MULTINODULAR   . HYPOTHYROIDISM   . IDDM   . HYPERCHOLESTEROLEMIA   . Morbid obesity   . BIPOLAR DISORDER UNSPECIFIED   . Chronic diastolic heart failure   . ALLERGIC RHINITIS   . APHTHOUS STOMATITIS   . TRANSAMINASES, SERUM, ELEVATED   . ASYMPTOMATIC POSTMENOPAUSAL STATUS   . FIBROMYALGIA     Past Surgical History  Procedure Date  . Cholecystectomy 1985  . Abdominal hysterectomy 1976  . Tubal ligation 1972  . Left ovary removed 1980  . Right nasal surgery 08/1988  . Right sinus removed 08/1989    tooth partial  . Percitania stone removed (l) kidney 1992  . Lithotripsy (l) kidney 1997  . Left knee arthroscopy      Partial medical menisectomy  . Left cystoscopy  1990  . Right knee surgery  08/2009    History  Smoking status  . Former Smoker  . Quit date: 04/06/1983  Smokeless tobacco  . Never Used  Comment: Married, lives with spouse. Disable- 2 grown kids-6 g-kids    History  Alcohol Use No    Family History  Problem Relation Age of Onset  . Hypertension Mother   . Diabetes Father   . Hypertension Father   . Hyperlipidemia Father   . Heart attack Other   . Coronary artery disease Other     Review of Systems: The review of systems is positive for increased weight gain.  She has occasional ankle swelling which varies. She is disabled because of her bipolar disorder. All other systems were reviewed and are negative.  Physical Exam: BP 110/72  Pulse 78  Ht 5\' 3"  (1.6 m)  Wt 246 lb 6.4 oz (111.766 kg)  BMI 43.65 kg/m2 She is a morbidly obese white female in no acute distress.The patient is  alert and oriented x 3.  The mood and affect are normal.  The skin is warm and dry.  Color is normal.  The HEENT exam reveals that the sclera are nonicteric.  The mucous membranes are moist.  The carotids are 2+ without bruits.  There is no thyromegaly.  There is no JVD.  The lungs reveal coarse breath sounds bilaterally with expiratory wheezing. The chest wall is non tender.  The heart exam reveals a regular rate with a normal S1 and S2.  There are no murmurs, gallops, or rubs.  The PMI is not displaced.   Abdominal exam reveals good bowel sounds.  There is no guarding or rebound.  There is no hepatosplenomegaly or tenderness.  There are no masses.  Exam of the legs reveal no clubbing, cyanosis, or edema.  The legs are without rashes.  The distal pulses are intact.  Cranial nerves II - XII are intact.  Motor and sensory functions are intact.  The gait is normal.  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 There are no  old ECGs for comparison.  Assessment / Plan:

## 2011-02-09 NOTE — Patient Instructions (Signed)
It was good to see you today. We have reviewed your prior ER records including labs and tests today Use omeprazole for reflux symptoms to help burning and cough - Also start inhaler every 4 hours as needed for cough/wheeze symptoms - Your prescription(s) have been submitted to your pharmacy. Please take as directed and contact our office if you believe you are having problem(s) with the medication(s).

## 2011-02-09 NOTE — Assessment & Plan Note (Signed)
Her chest pain symptoms are atypical for angina. However given her multiple cardiac risk factors I recommended a nuclear stress test to further stratify her cardiac risk.

## 2011-02-09 NOTE — Assessment & Plan Note (Signed)
I think that her dyspnea is predominantly related to asthma. She did have an echocardiogram in August of 2010 which was unremarkable. We will update it and repeat. I recommended that she schedule an appointment to see Dr. Bayard Hugger for treatment of her asthmatic symptoms.

## 2011-02-11 ENCOUNTER — Telehealth: Payer: Self-pay

## 2011-02-11 ENCOUNTER — Telehealth: Payer: Self-pay | Admitting: Endocrinology

## 2011-02-11 MED ORDER — PREDNISONE (PAK) 10 MG PO TABS: 10.0000 mg | ORAL_TABLET | ORAL | Status: DC

## 2011-02-11 MED ORDER — DOXYCYCLINE HYCLATE 100 MG PO TABS: 100.0000 mg | ORAL_TABLET | Freq: Two times a day (BID) | ORAL | Status: DC

## 2011-02-11 NOTE — Telephone Encounter (Signed)
Pt called stating she is no better than she was at OV, still has productive cough, wheezing and SOB. Pt is requesting Rx to treat, please advise.

## 2011-02-11 NOTE — Telephone Encounter (Signed)
Doxy antibiotics and Pred pak - erx done - use both in addition to ongoing tx (Alb MDI, omeprazole bid, cough syrup from ER) - ROV if still unimproved or worse - thx

## 2011-02-11 NOTE — Telephone Encounter (Signed)
Received copies from [facility] 02/11/11. Forwarded  2pages to Dr. Jannetta Quint review.

## 2011-02-12 ENCOUNTER — Telehealth: Payer: Self-pay | Admitting: Cardiology

## 2011-02-12 ENCOUNTER — Telehealth: Payer: Self-pay

## 2011-02-12 NOTE — Telephone Encounter (Signed)
The effects of steroids take about 1 week to go away.  Until then take 5 extra units of novolog qac/qhs, for any cbg in the 200's, adn 10 extra for any over 300.  Thi is in addition to your usual dosage.

## 2011-02-12 NOTE — Telephone Encounter (Signed)
Pt called stating her blood sugar has been elevated (250) since starting steroids, Rx'd by [facility]. Pt is requesting advisement from [facility].

## 2011-02-12 NOTE — Telephone Encounter (Signed)
Patient called to let Dr Swaziland and his nurse known that Dr. Felicity Coyer has put her on Prednisone for 6 days and Doxycycline for upper respiratory infection. She is scheduled for an Echo and stress test on 02/18/11.

## 2011-02-12 NOTE — Telephone Encounter (Signed)
Pt informed of MD's advisement. 

## 2011-02-12 NOTE — Telephone Encounter (Signed)
Pt calling wanting to inform MD/nurse that pt is taking steroids. Please return pt call to discuss further.

## 2011-02-15 ENCOUNTER — Telehealth: Payer: Self-pay

## 2011-02-15 DIAGNOSIS — R059 Cough, unspecified: Secondary | ICD-10-CM

## 2011-02-15 DIAGNOSIS — R05 Cough: Secondary | ICD-10-CM

## 2011-02-15 MED ORDER — HYDROCODONE-HOMATROPINE 5-1.5 MG/5ML PO SYRP: 5.0000 mL | ORAL_SOLUTION | Freq: Four times a day (QID) | ORAL | Status: AC | PRN

## 2011-02-15 NOTE — Telephone Encounter (Signed)
Called to let us know Dr. Felicity Coyer has put her on Prednisone and Doxycycline for URI. Per Dr. Swaziland need to reschedule lexiscan when she is feeling better. Pt wants to do both Echo and Lexiscan on same day since she is driving from [place]. Will call back when feeling better.

## 2011-02-15 NOTE — Telephone Encounter (Signed)
Pt called complaining of a disruptive cough that has not improved with steroids or ABX. Pt is requesting Rx for cough syrup, please advise.

## 2011-02-15 NOTE — Telephone Encounter (Signed)
Pt advised of Rx and referral. Rx faxed to pharmacy per pt request.

## 2011-02-15 NOTE — Telephone Encounter (Signed)
rx for cough syrup AND refer to pulm for ocugh done - thanks

## 2011-02-18 ENCOUNTER — Other Ambulatory Visit (HOSPITAL_COMMUNITY): Payer: Medicare Other | Admitting: Radiology

## 2011-02-19 ENCOUNTER — Ambulatory Visit (INDEPENDENT_AMBULATORY_CARE_PROVIDER_SITE_OTHER): Payer: Medicare Other | Admitting: Internal Medicine

## 2011-02-19 ENCOUNTER — Encounter: Payer: Self-pay | Admitting: Internal Medicine

## 2011-02-19 VITALS — BP 120/78 | HR 84 | Temp 98.3°F | Ht 63.0 in | Wt 246.2 lb

## 2011-02-19 DIAGNOSIS — R05 Cough: Secondary | ICD-10-CM

## 2011-02-19 DIAGNOSIS — R0600 Dyspnea, unspecified: Secondary | ICD-10-CM

## 2011-02-19 DIAGNOSIS — R0989 Other specified symptoms and signs involving the circulatory and respiratory systems: Secondary | ICD-10-CM

## 2011-02-19 DIAGNOSIS — R059 Cough, unspecified: Secondary | ICD-10-CM

## 2011-02-19 DIAGNOSIS — R0609 Other forms of dyspnea: Secondary | ICD-10-CM

## 2011-02-19 LAB — PULMONARY FUNCTION TEST

## 2011-02-19 NOTE — Progress Notes (Signed)
PFT done today. 

## 2011-02-19 NOTE — Progress Notes (Signed)
Subjective:    Patient ID: Brittany Acosta, female    DOB: Feb 14, 1951, 60 y.o.   MRN: [id number]  HPI  02/19/2011 OV : 60 year old, hardly a smoker, morbid obesity, dm (but no high bp), normal echo, normal cath 2001, ptsd, fibromyalgia, bipolar, hypothyroid, polypharmacy with narcs/benzo and psych meds  . Referred for cough. Started with cough before 02/16/11 but she does not remember exactly when but thinks cough onset > 1 but < 2 months ago. Insidious onset. Progressive since onset. At time is mild but at times is severe. Present both day and night. Wakes her up from sleep +. Dry cough in quality. Unclear with aggravating or relieving factors. Reports prior wheezing on 02/06/11 during ER visit but now resolved. Reports associated dyspnea that is chronic, exertional, and slowly gets worse, class 3 severity. Denies current chest pain. Per records Dr Swaziland suspects asthma but no formal diagnosis. Admits to GERD +  And is on "Something" for it though med review does not show PPI. She admits to chronic sinus drainage +.     RSI cough score is 31 of 45 and c/w LPR . Reports level 2 difficulty with foods, level 3 difficulty with hoarseness, post nasal drip/tickle in throat, cough after lying down, and sensation of lump in throat. Reports level 4 difficulty clearing throat, troublesome cough, and heart burn. THere is level 5 difficulty in choking sensation.   CT chest 02/06/11 shows thyroid goiter with tracheal deviation. PE ruled out. Normal lung parenchyma. Labs that day show normal renal function  Review of Systems  Constitutional: Negative for fever and unexpected weight change.  HENT: Positive for congestion. Negative for ear pain, nosebleeds, sore throat, rhinorrhea, sneezing, trouble swallowing, dental problem, postnasal drip and sinus pressure.   Eyes: Negative for redness and itching.  Respiratory: Positive for cough and shortness of breath. Negative for chest tightness and wheezing.     Cardiovascular: Positive for palpitations. Negative for leg swelling.  Gastrointestinal: Negative for nausea and vomiting.  Genitourinary: Negative for dysuria.  Musculoskeletal: Negative for joint swelling.  Skin: Negative for rash.  Neurological: Positive for headaches.  Hematological: Does not bruise/bleed easily.  Psychiatric/Behavioral: Positive for dysphoric mood. The patient is nervous/anxious.        Objective:   Physical Exam  Vitals reviewed. Constitutional: She is oriented to person, place, and time. She appears well-developed and well-nourished. No distress.       Obese   HENT:  Head: Normocephalic and atraumatic.  Right Ear: External ear normal.  Left Ear: External ear normal.  Mouth/Throat: Oropharynx is clear and moist. No oropharyngeal exudate.  Eyes: Conjunctivae and EOM are normal. Pupils are equal, round, and reactive to light. Right eye exhibits no discharge. Left eye exhibits no discharge. No scleral icterus.  Neck: Normal range of motion. Neck supple. No JVD present. No tracheal deviation present. No thyromegaly present.  Cardiovascular: Normal rate, regular rhythm, normal heart sounds and intact distal pulses.  Exam reveals no gallop and no friction rub.   No murmur heard. Pulmonary/Chest: Effort normal and breath sounds normal. No respiratory distress. She has no wheezes. She has no rales. She exhibits no tenderness.  Abdominal: Soft. Bowel sounds are normal. She exhibits no distension and no mass. There is no tenderness. There is no rebound and no guarding.  Musculoskeletal: Normal range of motion. She exhibits no edema and no tenderness.  Lymphadenopathy:    She has no cervical adenopathy.  Neurological: She is alert and oriented to  person, place, and time. She has normal reflexes. No cranial nerve deficit. She exhibits normal muscle tone. Coordination normal.  Skin: Skin is warm and dry. No rash noted. She is not diaphoretic. No erythema. No pallor.   Psychiatric:       Flat affect Slow response           Assessment & Plan:

## 2011-02-19 NOTE — Assessment & Plan Note (Signed)
I thnk she has LPR cough with sinus and gERd playing a big role. She might have undiagnosed asthma. Doubt with all her polypharmacy and PTSD issues how much headway one can make with Rx. Will try to keep it as simple as possible. First wil do PFT. IF negative, will do methacholine challenge test. IF that is negative, will Rx for gerd/sinus and monitor

## 2011-02-19 NOTE — Patient Instructions (Signed)
We need to figure out if you have asthma or not as first step Sinuses and acid reflux can also cause cough Please have full PFTs breathing test After this, I will look at result. If this is normal will do additional breathing test called methacholine challenge test

## 2011-02-22 ENCOUNTER — Telehealth: Payer: Self-pay | Admitting: Internal Medicine

## 2011-02-22 DIAGNOSIS — R059 Cough, unspecified: Secondary | ICD-10-CM

## 2011-02-22 DIAGNOSIS — R05 Cough: Secondary | ICD-10-CM

## 2011-02-22 NOTE — Telephone Encounter (Signed)
I spoke with pt and she is requesting her PFT results from 02/19/11. Please advise Dr. Marchelle Gearing, thanks

## 2011-02-22 NOTE — Telephone Encounter (Signed)
Please leave on my desk today for review

## 2011-02-22 NOTE — Telephone Encounter (Signed)
Please let her know PFTs normal (low dlco 65%, ? Cause, because CT chest is normal). Need to methacholine challenge test which is similar to PFTs but with many smaller breathing tests and she will have to inhaler irrriatnt. Normal cath; no perceived contraindications.  I gave results to patient myself.   She is ok having methacholine. Please set this up. Aftrer finishing test; she is to call me and I can look at report and decide next step

## 2011-02-22 NOTE — Telephone Encounter (Signed)
Results have been placed on your desk. Please advise Dr. Marchelle Gearing, thanks

## 2011-02-22 NOTE — Telephone Encounter (Signed)
Patient is aware and an order to set up methacholine challenge has been sent to the [facility].

## 2011-02-23 ENCOUNTER — Ambulatory Visit: Payer: Medicare Other | Admitting: Internal Medicine

## 2011-03-01 ENCOUNTER — Other Ambulatory Visit: Payer: Self-pay | Admitting: Endocrinology

## 2011-03-02 ENCOUNTER — Ambulatory Visit (HOSPITAL_COMMUNITY)
Admission: RE | Admit: 2011-03-02 | Discharge: 2011-03-02 | Disposition: A | Discharge status: Home / Self Care | Payer: Medicare Other | Source: Ambulatory Visit | Attending: Internal Medicine | Admitting: Internal Medicine

## 2011-03-02 ENCOUNTER — Other Ambulatory Visit: Payer: Self-pay | Admitting: *Deleted

## 2011-03-02 ENCOUNTER — Other Ambulatory Visit (HOSPITAL_COMMUNITY): Payer: Self-pay | Admitting: Radiology

## 2011-03-02 ENCOUNTER — Telehealth: Payer: Self-pay

## 2011-03-02 DIAGNOSIS — R059 Cough, unspecified: Secondary | ICD-10-CM | POA: Insufficient documentation

## 2011-03-02 DIAGNOSIS — R05 Cough: Secondary | ICD-10-CM

## 2011-03-02 LAB — PULMONARY FUNCTION TEST

## 2011-03-02 MED ORDER — SODIUM CHLORIDE 0.9 % IN NEBU: 3.0000 mL | INHALATION_SOLUTION | Freq: Once | RESPIRATORY_TRACT | Status: DC

## 2011-03-02 MED ORDER — METHACHOLINE 0.25 MG/ML NEB SOLN: 2.0000 mL | Freq: Once | RESPIRATORY_TRACT | Status: DC

## 2011-03-02 MED ORDER — METHACHOLINE 4 MG/ML NEB SOLN: 2.0000 mL | Freq: Once | RESPIRATORY_TRACT | Status: DC

## 2011-03-02 MED ORDER — METHACHOLINE 0.0625 MG/ML NEB SOLN: 2.0000 mL | Freq: Once | RESPIRATORY_TRACT | Status: DC

## 2011-03-02 MED ORDER — ALBUTEROL SULFATE (5 MG/ML) 0.5% IN NEBU: 2.5000 mg | INHALATION_SOLUTION | Freq: Once | RESPIRATORY_TRACT | Status: DC

## 2011-03-02 MED ORDER — METHACHOLINE 1 MG/ML NEB SOLN: 2.0000 mL | Freq: Once | RESPIRATORY_TRACT | Status: DC

## 2011-03-02 MED ORDER — METHACHOLINE 16 MG/ML NEB SOLN: 2.0000 mL | Freq: Once | RESPIRATORY_TRACT | Status: DC

## 2011-03-02 MED ORDER — GLUCOSE BLOOD VI STRP: ORAL_STRIP | Status: DC

## 2011-03-02 NOTE — Telephone Encounter (Signed)
Pt called stating that Dr Everardo All has discussed with her the possibility of having gastric bypass surgery. Pt says that she is now interested in pursuing this option. Pt is requesting referral.

## 2011-03-02 NOTE — Telephone Encounter (Signed)
done

## 2011-03-02 NOTE — Telephone Encounter (Signed)
Pt informed

## 2011-03-02 NOTE — Telephone Encounter (Signed)
R'cd fax from [facility] for refill of test strips.  Last OV-12/2010

## 2011-03-03 ENCOUNTER — Telehealth: Payer: Self-pay | Admitting: Internal Medicine

## 2011-03-03 NOTE — Telephone Encounter (Signed)
[email or web address].Cailean Heacock, CMA  

## 2011-03-03 NOTE — Telephone Encounter (Signed)
Need her to come in. Lot of discrepancy between full pft and pre methacholine challenge . ? Thyroid the issue, ? VCD.  Please give first avail appt

## 2011-03-03 NOTE — Telephone Encounter (Signed)
Pt returned Jennifer's call.  appt scheduled with MR 12.10.12 @ 1530.  Will sign off on message.

## 2011-03-08 ENCOUNTER — Ambulatory Visit (HOSPITAL_COMMUNITY): Payer: Medicare Other | Attending: Cardiology | Admitting: Radiology

## 2011-03-08 ENCOUNTER — Encounter: Payer: Self-pay | Admitting: Internal Medicine

## 2011-03-08 VITALS — Ht 63.5 in | Wt 240.0 lb

## 2011-03-08 DIAGNOSIS — Z8249 Family history of ischemic heart disease and other diseases of the circulatory system: Secondary | ICD-10-CM | POA: Insufficient documentation

## 2011-03-08 DIAGNOSIS — I079 Rheumatic tricuspid valve disease, unspecified: Secondary | ICD-10-CM | POA: Insufficient documentation

## 2011-03-08 DIAGNOSIS — R0789 Other chest pain: Secondary | ICD-10-CM

## 2011-03-08 DIAGNOSIS — E119 Type 2 diabetes mellitus without complications: Secondary | ICD-10-CM | POA: Insufficient documentation

## 2011-03-08 DIAGNOSIS — R9431 Abnormal electrocardiogram [ECG] [EKG]: Secondary | ICD-10-CM | POA: Insufficient documentation

## 2011-03-08 DIAGNOSIS — R0609 Other forms of dyspnea: Secondary | ICD-10-CM | POA: Insufficient documentation

## 2011-03-08 DIAGNOSIS — I4510 Unspecified right bundle-branch block: Secondary | ICD-10-CM | POA: Insufficient documentation

## 2011-03-08 DIAGNOSIS — Z794 Long term (current) use of insulin: Secondary | ICD-10-CM | POA: Insufficient documentation

## 2011-03-08 DIAGNOSIS — R0602 Shortness of breath: Secondary | ICD-10-CM

## 2011-03-08 DIAGNOSIS — R079 Chest pain, unspecified: Secondary | ICD-10-CM | POA: Insufficient documentation

## 2011-03-08 DIAGNOSIS — E785 Hyperlipidemia, unspecified: Secondary | ICD-10-CM | POA: Insufficient documentation

## 2011-03-08 DIAGNOSIS — E109 Type 1 diabetes mellitus without complications: Secondary | ICD-10-CM

## 2011-03-08 DIAGNOSIS — I10 Essential (primary) hypertension: Secondary | ICD-10-CM | POA: Insufficient documentation

## 2011-03-08 DIAGNOSIS — R5383 Other fatigue: Secondary | ICD-10-CM | POA: Insufficient documentation

## 2011-03-08 DIAGNOSIS — E669 Obesity, unspecified: Secondary | ICD-10-CM | POA: Insufficient documentation

## 2011-03-08 DIAGNOSIS — R0989 Other specified symptoms and signs involving the circulatory and respiratory systems: Secondary | ICD-10-CM | POA: Insufficient documentation

## 2011-03-08 DIAGNOSIS — R5381 Other malaise: Secondary | ICD-10-CM | POA: Insufficient documentation

## 2011-03-08 DIAGNOSIS — R0600 Dyspnea, unspecified: Secondary | ICD-10-CM

## 2011-03-08 DIAGNOSIS — Z87891 Personal history of nicotine dependence: Secondary | ICD-10-CM | POA: Insufficient documentation

## 2011-03-08 DIAGNOSIS — E7800 Pure hypercholesterolemia, unspecified: Secondary | ICD-10-CM

## 2011-03-08 DIAGNOSIS — R000 Tachycardia, unspecified: Secondary | ICD-10-CM | POA: Insufficient documentation

## 2011-03-08 MED ORDER — TECHNETIUM TC 99M TETROFOSMIN IV KIT
33.0000 | PACK | Freq: Once | INTRAVENOUS | Status: AC | PRN
  Administered 2011-03-08: 33 via INTRAVENOUS

## 2011-03-08 MED ORDER — TECHNETIUM TC 99M TETROFOSMIN IV KIT
11.0000 | PACK | Freq: Once | INTRAVENOUS | Status: AC | PRN
  Administered 2011-03-08: 11 via INTRAVENOUS

## 2011-03-08 MED ORDER — REGADENOSON 0.4 MG/5ML IV SOLN
0.4000 mg | Freq: Once | INTRAVENOUS | Status: AC
  Administered 2011-03-08: 0.4 mg via INTRAVENOUS

## 2011-03-08 NOTE — Progress Notes (Signed)
[facility] SITE 3 NUCLEAR MED [address] [place] [address] [phone]  Cardiology Nuclear Med Study  Brittany Acosta is a 60 y.o. female [id number] [birth year]   Nuclear Med Background Indication for Stress Test:  Evaluation for Ischemia, 02/06/11 Post Hospital:Dyspnea and Abnormal EKG-RBBB History:  '01 Cath:Normal per patient; '09 MPS:OK per patient; '10 Echo:EF=60-65% Cardiac Risk Factors: Family History - CAD, History of Smoking, Hypertension, IDDM Type 2, Lipids, Obesity and New RBBB  Symptoms:  DOE, Fatigue and Rapid HR   Nuclear Pre-Procedure Caffeine/Decaff Intake:  None NPO After: 7:00pm   Lungs:  Clear.  O2 SAT 95% on RA. IV 0.9% NS with Angio Cath:  20g  IV Site: L Antecubital  IV Started by:  Stanton Kidney, EMT-P  Chest Size (in):  48 Cup Size: DD  Height: 5' 3.5" (1.613 m)  Weight:  240 lb (108.863 kg)  BMI:  Body mass index is 41.85 kg/(m^2). Tech Comments:  CBG=239 @ 6:30 am, per patient.    Nuclear Med Study 1 or 2 day study: 1 day  Stress Test Type:  Lexiscan  Reading MD: Olga Millers, MD  Order Authorizing Provider:  Peter Swaziland, MD  Resting Radionuclide: Technetium 25m Tetrofosmin  Resting Radionuclide Dose: 11.0 mCi   Stress Radionuclide:  Technetium 60m Tetrofosmin  Stress Radionuclide Dose: 33.0 mCi           Stress Protocol Rest HR: 84 Stress HR: 97  Rest BP: 107/60 Stress BP: 104/62  Exercise Time (min): n/a METS: n/a   Predicted Max HR: 160 bpm % Max HR: 60.62 bpm Rate Pressure Product: 21308   Dose of Adenosine (mg):  n/a Dose of Lexiscan: 0.4 mg  Dose of Atropine (mg): n/a Dose of Dobutamine: n/a mcg/kg/min (at max HR)  Stress Test Technologist: Smiley Houseman, CMA-N  Nuclear Technologist:  Domenic Polite, CNMT     Rest Procedure:  Myocardial perfusion imaging was performed at rest 45 minutes following the intravenous administration of Technetium 32m Tetrofosmin.  Rest ECG: RBBB  Stress Procedure:  The patient  received IV Lexiscan 0.4 mg over 15-seconds.  Technetium 47m Tetrofosmin injected at 30-seconds.  There were no significant changes with Lexiscan.  Quantitative spect images were obtained after a 45 minute delay.  Stress ECG: No significant ST segment change suggestive of ischemia.  QPS Raw Data Images:  Acquisition technically good; normal left ventricular size. Stress Images:  Normal homogeneous uptake in all areas of the myocardium. Rest Images:  Normal homogeneous uptake in all areas of the myocardium. Subtraction (SDS):  No evidence of ischemia. Transient Ischemic Dilatation (Normal <1.22):  1.18 Lung/Heart Ratio (Normal <0.45):  0.41  Quantitative Gated Spect Images QGS EDV:  74 ml QGS ESV:  18 ml QGS cine images:  NL LV Function; NL Wall Motion QGS EF: 76%  Impression Exercise Capacity:  Lexiscan with no exercise. BP Response:  Normal blood pressure response. Clinical Symptoms:  No chest pain. ECG Impression:  No significant ST segment change suggestive of ischemia. Comparison with Prior Nuclear Study: No images to compare  Overall Impression:  Normal stress nuclear study.  Olga Millers

## 2011-03-09 ENCOUNTER — Telehealth: Payer: Self-pay | Admitting: Cardiology

## 2011-03-09 NOTE — Telephone Encounter (Signed)
Patient called,was told Lexiscan and echo normal.

## 2011-03-09 NOTE — Telephone Encounter (Signed)
New Msg: Pt calling wanting to know results of pt ECHO and rest lexi. Please return pt call to discuss further.

## 2011-03-15 ENCOUNTER — Telehealth: Payer: Self-pay | Admitting: Internal Medicine

## 2011-03-15 ENCOUNTER — Encounter: Payer: Self-pay | Admitting: Internal Medicine

## 2011-03-15 ENCOUNTER — Ambulatory Visit (INDEPENDENT_AMBULATORY_CARE_PROVIDER_SITE_OTHER): Payer: Medicare Other | Admitting: Internal Medicine

## 2011-03-15 DIAGNOSIS — R05 Cough: Secondary | ICD-10-CM

## 2011-03-15 DIAGNOSIS — R059 Cough, unspecified: Secondary | ICD-10-CM

## 2011-03-15 NOTE — Patient Instructions (Signed)
My feeling is that the goiter is causing pressure effect on breathing tube and might be causing your symptoms I will talk to Dr Everardo All if surgical option is an option or not Please wait to hear from [place]

## 2011-03-15 NOTE — Progress Notes (Signed)
Subjective:    Patient ID: Brittany Acosta, female    DOB: 05-Mar-1951, 60 y.o.   MRN: [id number]  HPI  02/19/2011 IOV : 60 year old, hardly a smoker, morbid obesity, dm (but no high bp), normal echo, normal cath 2001, ptsd, fibromyalgia, bipolar, hypothyroid, polypharmacy with narcs/benzo and psych meds  .Referred for cough. Started with cough before 02/16/11 but she does not remember exactly when but thinks cough onset > 1 but < 2 months ago. Insidious onset. Progressive since onset. At time is mild but at times is severe. Present both day and night. Wakes her up from sleep +. Dry cough in quality. Unclear with aggravating or relieving factors. Reports prior wheezing on 02/06/11 during ER visit but now resolved. Reports associated dyspnea that is chronic, exertional, and slowly gets worse, class 3 severity. Denies current chest pain. Per records Dr Swaziland suspects asthma but no formal diagnosis. Admits to GERD +  And is on "Something" for it though med review does not show PPI. She admits to chronic sinus drainage +.   RSI cough score is 31 of 45 and c/w LPR . Reports level 2 difficulty with foods, level 3 difficulty with hoarseness, post nasal drip/tickle in throat, cough after lying down, and sensation of lump in throat. Reports level 4 difficulty clearing throat, troublesome cough, and heart burn. THere is level 5 difficulty in choking sensation.   CT chest 02/06/11 shows thyroid goiter with tracheal deviation (also conformed in cxr). PE ruled out. Normal lung parenchyma. Labs that day show normal renal function  OV 03/15/2011 Had PFT 02/19/11: essentially normal except for isolated low DLCO 65%. It appeared that flow volume was flat esp at peak flow. We then sent her to her methacholine challeng and there it appears both insp and flow volume loops somewwhat flat and fvc was 61%. So test prematurely terminated. The FVC in both tests are markedly different raising possibility along with flattened flow  volume loop of upper airway obstruction.  Today she states cough is spontaneously improved. RSI cough score is only 14 (Level 4 - clearing of throat, post nasal drip, and heart burn. Level 1 - cough after lying down, and sensation of somehting in throat)  Reviewed her past hx again: she is aware of goiter. I discussed this in context of tracheal deviation due to goiter and flat flow volume loops. SHe says is being monitored by Dr. Everardo All. No surgical consult done so far  No change in social or family hx    Review of Systems  Constitutional: Negative for fever and unexpected weight change.  HENT: Positive for ear pain and sinus pressure. Negative for nosebleeds, congestion, sore throat, rhinorrhea, sneezing, trouble swallowing, dental problem and postnasal drip.   Eyes: Positive for redness and itching.  Respiratory: Positive for shortness of breath and wheezing. Negative for cough and chest tightness.   Cardiovascular: Positive for leg swelling. Negative for palpitations.  Gastrointestinal: Positive for nausea. Negative for vomiting and diarrhea.  Genitourinary: Positive for dysuria.  Musculoskeletal: Negative for joint swelling.  Skin: Negative for rash.  Neurological: Positive for headaches.  Hematological: Negative for adenopathy. Bruises/bleeds easily.  Psychiatric/Behavioral: Positive for dysphoric mood. The patient is nervous/anxious.        Objective:   Physical Exam  Physical Exam  Vitals reviewed. Constitutional: She is oriented to person, place, and time. She appears well-developed and well-nourished. No distress.       Obese   HENT:  Head: Normocephalic and atraumatic.  Right  Ear: External ear normal.  Left Ear: External ear normal.  Mouth/Throat: Oropharynx is clear and moist. No oropharyngeal exudate.  Eyes: Conjunctivae and EOM are normal. Pupils are equal, round, and reactive to light. Right eye exhibits no discharge. Left eye exhibits no discharge. No scleral  icterus.  Neck: Normal range of motion. Neck supple. No JVD present. No tracheal deviation present. No thyromegaly present.  Cardiovascular: Normal rate, regular rhythm, normal heart sounds and intact distal pulses.  Exam reveals no gallop and no friction rub.   No murmur heard. Pulmonary/Chest: Effort normal and breath sounds normal. No respiratory distress. She has no wheezes. She has no rales. She exhibits no tenderness.  Abdominal: Soft. Bowel sounds are normal. She exhibits no distension and no mass. There is no tenderness. There is no rebound and no guarding.  Musculoskeletal: Normal range of motion. She exhibits no edema and no tenderness.  Lymphadenopathy:    She has no cervical adenopathy.  Neurological: She is alert and oriented to person, place, and time. She has normal reflexes. No cranial nerve deficit. She exhibits normal muscle tone. Coordination normal.  Skin: Skin is warm and dry. No rash noted. She is not diaphoretic. No erythema. No pallor.  Psychiatric:       Flat affect Slow response        Assessment & Plan:

## 2011-03-15 NOTE — Telephone Encounter (Signed)
Hi Sean  I saw her for cough. What is striking is hte tracheal deviation on CT from the gotier. HEr PFTS show flatening of flow volume loop on inspiration and expiration. Suspect fixed obstruction. She is having hoarseness and mild upper airway symptoms too. I suspect cough related to this. Wondering if surgical resection in an option or indicated ?   Please let me know  THanks  MR

## 2011-03-16 ENCOUNTER — Ambulatory Visit (INDEPENDENT_AMBULATORY_CARE_PROVIDER_SITE_OTHER): Payer: Medicare Other | Admitting: Endocrinology

## 2011-03-16 ENCOUNTER — Telehealth: Payer: Self-pay | Admitting: Internal Medicine

## 2011-03-16 ENCOUNTER — Encounter: Payer: Self-pay | Admitting: Endocrinology

## 2011-03-16 ENCOUNTER — Encounter: Payer: Self-pay | Admitting: Internal Medicine

## 2011-03-16 VITALS — BP 122/64 | HR 89 | Temp 98.2°F | Ht 63.5 in | Wt 243.6 lb

## 2011-03-16 DIAGNOSIS — E039 Hypothyroidism, unspecified: Secondary | ICD-10-CM

## 2011-03-16 DIAGNOSIS — E042 Nontoxic multinodular goiter: Secondary | ICD-10-CM

## 2011-03-16 DIAGNOSIS — E109 Type 1 diabetes mellitus without complications: Secondary | ICD-10-CM

## 2011-03-16 MED ORDER — INSULIN ASPART PROT & ASPART (70-30 MIX) 100 UNIT/ML ~~LOC~~ SUSP: SUBCUTANEOUS | Status: DC

## 2011-03-16 MED ORDER — GLUCOSE BLOOD VI STRP: 1.0000 | ORAL_STRIP | Freq: Two times a day (BID) | Status: DC

## 2011-03-16 NOTE — Assessment & Plan Note (Addendum)
Given variable FVC and fev1 readings wihtout Rx intervention and given relatively flat flow volume loops and CT/CXR showing large goiter causing tracheal deviation and she reporting upper airway hoarseness, I suspect cough is due to goiter. Given clinical findings of possible obstruction I think surgical resection is needed but I need to know Dr Harlow Asa opinion. Clearly, it would be difficult to diagnose etiology of cough without resolving this issue. I have sent a message to Dr Everardo All and if he is ok, refer [facility]  > 50% of this > 15 min visit spent in face to face counseling

## 2011-03-16 NOTE — Progress Notes (Signed)
Subjective:    Patient ID: Brittany Acosta, female    DOB: 10/21/1950, 60 y.o.   MRN: [id number]  HPI Pt states few years of slight wheezing sensation in the chest, an assoc cough.  She saw dr Izell [place]kas[place], who noted spirometry pattern c/w extrathoracic airway obstruction. Pt returns for f/u of insulin-requiring DM (2002).  she brings a record of her cbg's which i have reviewed today.  It was low twice, at hs (61 and 67).  She does not know why this happened.   She does not check cbg at lunch. At breakfast and afternoon, it is approx 200.   On further questioning, she says she has not been taking insulin at lunch.   Past Medical History  Diagnosis Date  . GOITER, MULTINODULAR   . HYPOTHYROIDISM   . IDDM   . HYPERCHOLESTEROLEMIA   . Morbid obesity   . BIPOLAR DISORDER UNSPECIFIED   . Chronic diastolic heart failure   . ALLERGIC RHINITIS   . APHTHOUS STOMATITIS   . TRANSAMINASES, SERUM, ELEVATED   . ASYMPTOMATIC POSTMENOPAUSAL STATUS   . FIBROMYALGIA     Past Surgical History  Procedure Date  . Cholecystectomy 1985  . Abdominal hysterectomy 1976  . Tubal ligation 1972  . Left ovary removed 1980  . Right nasal surgery 08/1988  . Right sinus removed 08/1989    tooth partial  . Percitania stone removed (l) kidney 1992  . Lithotripsy (l) kidney 1997  . Left knee arthroscopy      Partial medical menisectomy  . Left cystoscopy  1990  . Right knee surgery  08/2009    History   Social History  . Marital Status: Married    Spouse Name: N/A    Number of Children: 2  . Years of Education: N/A   Occupational History  . disability    Social History Main Topics  . Smoking status: Former Smoker -- 1.0 packs/day for 6 years    Types: Cigarettes    Quit date: 04/06/1983  . Smokeless tobacco: Never Used   Comment: Married, lives with spouse. Disable- 2 grown kids-6 g-kids  . Alcohol Use: No  . Drug Use: No  . Sexually Active: Not on file   Other Topics Concern  . Not on file    Social History Narrative   Married, lives with spouse - 2 adult childrendiabled    Current Outpatient Prescriptions on File Prior to Visit  Medication Sig Dispense Refill  . Alcohol Swabs (ALCOHOL WIPES) 70 % PADS by Does not apply route. Use as directed        . ARIPiprazole (ABILIFY) 20 MG tablet Take 20 mg by mouth at bedtime.        Marland Kitchen aspirin 81 MG tablet Take 81 mg by mouth at bedtime.       Marland Kitchen atorvastatin (LIPITOR) 20 MG tablet Take 1 tablet (20 mg total) by mouth daily.  90 tablet  3  . Biotin 5000 MCG CAPS Take 1 capsule by mouth 3 (three) times daily.        . Calcium Carbonate-Vitamin D (CALCIUM 600 + D PO) Take 1 tablet by mouth 2 (two) times daily.        . Cholecalciferol (VITAMIN D) 2000 UNITS CAPS Take 1 capsule by mouth at bedtime.        . clonazePAM (KLONOPIN) 1 MG tablet Take 2 mg by mouth at bedtime.       . Coenzyme Q10 (CO Q 10) 60 MG CAPS  Take 1 capsule by mouth 3 (three) times daily.        . furosemide (LASIX) 40 MG tablet Take 1 tablet (40 mg total) by mouth daily.  90 tablet  2  . hydrochlorothiazide 25 MG tablet Take 25 mg by mouth every morning.        . indomethacin (INDOCIN SR) 75 MG CR capsule as needed.       . Lactobacillus (ULTIMATE PROBIOTIC FORMULA PO) Take 1 capsule by mouth daily.        Marland Kitchen lamoTRIgine (LAMICTAL) 200 MG tablet Take 200 mg by mouth daily at 12 noon.        Marland Kitchen levothyroxine (SYNTHROID) 50 MCG tablet Take 1 tablet (50 mcg total) by mouth daily.  90 tablet  2  . Magnesium Malate POWD 1000mg  tablet 1 tablet by mouth three times a day with meals       . Manganese Gluconate 50 MG TABS Take 1 tablet by mouth daily.        . metFORMIN (GLUCOPHAGE) 1000 MG tablet TAKE 1 TABLET EVERY        MORNING AND 1 TABLET IN THEEVENING  180 tablet  2  . mirtazapine (REMERON) 30 MG tablet Take 30 mg by mouth at bedtime.        . Multiple Vitamin (MULTIVITAMIN) tablet Take 1 tablet by mouth daily.        . naproxen (NAPROSYN) 500 MG tablet TAKE 1 TABLET  TWICE A DAY  WITH MEALS  180 tablet  1  . omeprazole (PRILOSEC) 20 MG capsule Take 40 mg by mouth daily.        Marland Kitchen oxybutynin (DITROPAN-XL) 10 MG 24 hr tablet Take 10 mg by mouth every evening.        . potassium citrate (UROCIT-K 10) 10 MEQ (1080 MG) SR tablet Take 20 mEq by mouth 2 (two) times daily.       Marland Kitchen PREMARIN 0.3 MG tablet TAKE 1 TABLET DAILY  90 tablet  1  . QUEtiapine Fumarate (SEROQUEL XR) 150 MG TB24 Take by mouth. Take 1 every morning and 3  At bedtime       . Thiamine HCl (VITAMIN B-1) 100 MG tablet Take 100 mg by mouth daily.        Marland Kitchen topiramate (TOPAMAX) 100 MG tablet Take 100 mg by mouth 2 (two) times daily.        Marland Kitchen venlafaxine (EFFEXOR-XR) 150 MG 24 hr capsule Take 150 mg by mouth every morning.        Marland Kitchen albuterol (PROVENTIL,VENTOLIN) 90 MCG/ACT inhaler Inhale 2 puffs into the lungs every 6 (six) hours as needed for wheezing or shortness of breath.  17 g  0  . Insulin Pen Needle (B-D UF III MINI PEN NEEDLES) 31G X 5 MM MISC 1 each by Other route 3 (three) times daily.  300 each  3    No Known Allergies  Family History  Problem Relation Age of Onset  . Hypertension Mother   . Diabetes Father   . Hypertension Father   . Hyperlipidemia Father   . Heart attack Other   . Coronary artery disease Other     BP 122/64  Pulse 89  Temp(Src) 98.2 F (36.8 C) (Oral)  Ht 5' 3.5" (1.613 m)  Wt 243 lb 9.6 oz (110.496 kg)  BMI 42.47 kg/m2  SpO2 94%    Review of Systems Denies loc and weight change.    Objective:   Physical Exam VITAL  SIGNS:  See vs page GENERAL: no distress Neck: thyroid is 5x normal size, with multinodular surface   Lab Results  Component Value Date   TSH 1.11 12/14/2010      Assessment & Plan:  Multinodular goiter, apparently causing airway compromise.  We discussed the pros and cons of thyroidectomy vs i-131 rx.   Hypothyroidism.  She is euthyroid on synthroid.  However, she would need to be off synthroid x 2 mos before she could have i-131  rx, so i favor surgery.   DM.  She may do better with a simpler regimen.

## 2011-03-16 NOTE — Telephone Encounter (Signed)
Please get hold of Dr Christia Reading ENT for me please  Thanks MR

## 2011-03-16 NOTE — Patient Instructions (Addendum)
pending the test results, please change novolog to novolog 70/30, 30 units with breakfast, and 15 with the evening meal. check your blood sugar 2 times a day.  vary the time of day when you check, between before the 3 meals, and at bedtime.  also check if you have symptoms of your blood sugar being too high or too low.  please keep a record of the readings and bring it to your next appointment here.  please call us sooner if you are having low blood sugar episodes. Please make a follow-up appointment in 1 month.   Let's wait to hear back from dr Izell Eunola about his advice re: radioactive iodine treatmetn of the goiter, vs surgical removal.   (update: pt is advised that dr Izell Avon advises surgery--i agree).

## 2011-03-17 ENCOUNTER — Telehealth: Payer: Self-pay

## 2011-03-17 MED ORDER — INSULIN PEN NEEDLE 31G X 5 MM MISC: 1.0000 | Freq: Three times a day (TID) | Status: DC

## 2011-03-17 NOTE — Telephone Encounter (Signed)
Pt called requesting refill of pen needles, per pharmacy.

## 2011-03-17 NOTE — Telephone Encounter (Signed)
LMTCBx1 with Dr. Jenne Pane nurse and I left MR cell number per his request for Dr. Jenne Pane to call him in regards to this patient. I will forward message to MR. Carron Curie, CMA

## 2011-03-17 NOTE — Telephone Encounter (Signed)
Gregary Signs  I d/w Dr Jenne Pane today. ANd we both thought thyroidectomy best option. HE will touch base with you to see what you think  THanks  MR

## 2011-03-17 NOTE — Telephone Encounter (Signed)
Spoke to Dr Jenne Pane. He agrees she needs thyroidectomy. He has already called patient. He will be in touch with Dr Everardo All

## 2011-03-18 NOTE — Telephone Encounter (Signed)
Left message for pt to callback office.  

## 2011-03-18 NOTE — Telephone Encounter (Signed)
Pt informed of MD's advisement. 

## 2011-03-18 NOTE — Telephone Encounter (Signed)
Ashely: please call patient:  Dr Sandria Manly has advised that you consider surgery.  You will receive a phone call, about a date and time to discuss this with surgeon.

## 2011-03-24 ENCOUNTER — Telehealth: Payer: Self-pay | Admitting: *Deleted

## 2011-03-24 DIAGNOSIS — E042 Nontoxic multinodular goiter: Secondary | ICD-10-CM

## 2011-03-24 NOTE — Telephone Encounter (Signed)
Pt is calling on status of thyroidectomy so that she can make plans accordingly.

## 2011-03-24 NOTE — Telephone Encounter (Signed)
Pt informed of referral and that PCC's will contact regarding appt information.

## 2011-03-24 NOTE — Telephone Encounter (Signed)
i made referral

## 2011-03-26 ENCOUNTER — Ambulatory Visit: Payer: Medicare Other | Admitting: Endocrinology

## 2011-03-26 ENCOUNTER — Ambulatory Visit: Payer: Medicare Other | Admitting: Internal Medicine

## 2011-04-04 ENCOUNTER — Other Ambulatory Visit: Payer: Self-pay | Admitting: Internal Medicine

## 2011-04-06 HISTORY — PX: ROTATOR CUFF REPAIR: SHX139

## 2011-04-07 ENCOUNTER — Encounter (HOSPITAL_COMMUNITY): Admission: RE | Payer: Self-pay | Source: Ambulatory Visit

## 2011-04-07 ENCOUNTER — Ambulatory Visit (HOSPITAL_COMMUNITY): Admission: RE | Admit: 2011-04-07 | Payer: Medicare Other | Source: Ambulatory Visit | Admitting: Otolaryngology

## 2011-04-07 ENCOUNTER — Other Ambulatory Visit: Payer: Self-pay | Admitting: *Deleted

## 2011-04-07 SURGERY — THYROIDECTOMY
Anesthesia: General | Laterality: Left

## 2011-04-07 MED ORDER — OMEPRAZOLE 20 MG PO CPDR: 20.0000 mg | DELAYED_RELEASE_CAPSULE | Freq: Two times a day (BID) | ORAL | Status: DC

## 2011-04-07 NOTE — Telephone Encounter (Signed)
Received fax stating insurance require 90 supply on omeprazole. Sending new rx to pharmacy...04/07/11@9 :46am/LMB

## 2011-04-09 ENCOUNTER — Other Ambulatory Visit: Payer: Self-pay | Admitting: *Deleted

## 2011-04-09 MED ORDER — NAPROXEN 500 MG PO TABS: 500.0000 mg | ORAL_TABLET | Freq: Two times a day (BID) | ORAL | Status: DC

## 2011-04-13 ENCOUNTER — Telehealth: Payer: Self-pay | Admitting: *Deleted

## 2011-04-13 ENCOUNTER — Encounter (HOSPITAL_COMMUNITY): Payer: Self-pay

## 2011-04-13 NOTE — Telephone Encounter (Signed)
Pt left vm wanting to let md know that Dr. Jenne Pane will be doing Thyroidectomy on both sides on 04/22/11...1/8/13210:46am/LMB

## 2011-04-13 NOTE — Telephone Encounter (Signed)
Noted thanks °

## 2011-04-14 ENCOUNTER — Telehealth: Payer: Self-pay | Admitting: *Deleted

## 2011-04-14 MED ORDER — OMEPRAZOLE 20 MG PO CPDR: 20.0000 mg | DELAYED_RELEASE_CAPSULE | Freq: Two times a day (BID) | ORAL | Status: DC

## 2011-04-14 NOTE — Telephone Encounter (Signed)
Pt states needing new rx for omeprazole fax to mail service [phone]. Inform pt will send rx to mail service...04/14/11@8 :32am/LMB

## 2011-04-15 ENCOUNTER — Ambulatory Visit: Payer: Medicare Other | Admitting: Endocrinology

## 2011-04-19 ENCOUNTER — Other Ambulatory Visit (HOSPITAL_BASED_OUTPATIENT_CLINIC_OR_DEPARTMENT_OTHER): Payer: Self-pay | Admitting: Otolaryngology

## 2011-04-20 ENCOUNTER — Encounter (HOSPITAL_COMMUNITY)
Admission: RE | Admit: 2011-04-20 | Discharge: 2011-04-20 | Disposition: A | Discharge status: Home / Self Care | Payer: Medicare Other | Source: Ambulatory Visit | Attending: Otolaryngology | Admitting: Otolaryngology

## 2011-04-20 ENCOUNTER — Encounter (HOSPITAL_COMMUNITY): Payer: Self-pay

## 2011-04-20 HISTORY — DX: Unspecified osteoarthritis, unspecified site: M19.90

## 2011-04-20 HISTORY — DX: Gastro-esophageal reflux disease without esophagitis: K21.9

## 2011-04-20 HISTORY — DX: Nausea with vomiting, unspecified: R11.2

## 2011-04-20 HISTORY — DX: Other specified postprocedural states: Z98.890

## 2011-04-20 HISTORY — DX: Personal history of (healed) traumatic fracture: Z87.81

## 2011-04-20 HISTORY — DX: Major depressive disorder, single episode, unspecified: F32.9

## 2011-04-20 HISTORY — DX: Depression, unspecified: F32.A

## 2011-04-20 LAB — COMPREHENSIVE METABOLIC PANEL
ALT: 26 U/L (ref 0–35)
AST: 25 U/L (ref 0–37)
Albumin: 3.4 g/dL — ABNORMAL LOW (ref 3.5–5.2)
Alkaline Phosphatase: 114 U/L (ref 39–117)
BUN: 17 mg/dL (ref 6–23)
CO2: 32 mEq/L (ref 19–32)
Calcium: 9.5 mg/dL (ref 8.4–10.5)
Chloride: 99 mEq/L (ref 96–112)
Creatinine, Ser: 0.94 mg/dL (ref 0.50–1.10)
GFR calc Af Amer: 75 mL/min — ABNORMAL LOW (ref >90)
GFR calc non Af Amer: 65 mL/min — ABNORMAL LOW (ref >90)
Glucose, Bld: 82 mg/dL (ref 70–99)
Potassium: 3.5 mEq/L (ref 3.5–5.1)
Sodium: 141 mEq/L (ref 135–145)
Total Bilirubin: 0.1 mg/dL — ABNORMAL LOW (ref 0.3–1.2)
Total Protein: 6.2 g/dL (ref 6.0–8.3)

## 2011-04-20 LAB — SURGICAL PCR SCREEN
MRSA, PCR: NEGATIVE
Staphylococcus aureus: NEGATIVE

## 2011-04-20 LAB — CBC
HCT: 32.6 % — ABNORMAL LOW (ref 36.0–46.0)
Hemoglobin: 10.2 g/dL — ABNORMAL LOW (ref 12.0–15.0)
MCH: 22.9 pg — ABNORMAL LOW (ref 26.0–34.0)
MCHC: 31.3 g/dL (ref 30.0–36.0)
MCV: 73.3 fL — ABNORMAL LOW (ref 78.0–100.0)
Platelets: 428 10*3/uL — ABNORMAL HIGH (ref 150–400)
RBC: 4.45 MIL/uL (ref 3.87–5.11)
RDW: 16.1 % — ABNORMAL HIGH (ref 11.5–15.5)
WBC: 11.7 10*3/uL — ABNORMAL HIGH (ref 4.0–10.5)

## 2011-04-20 NOTE — Pre-Procedure Instructions (Signed)
20 AVITAL DANCY  04/20/2011   Your procedure is scheduled on:  Thursday, Jan 17  Report to [facility] at 0530 AM.  Call this number if you have problems the morning of surgery: [phone]   Remember:   Do not eat food:After Midnight.  May have clear liquids: up to 4 Hours before arrival.  Clear liquids include soda, tea, black coffee, apple or grape juice, broth.  Take these medicines the morning of surgery with A SIP OF WATER: *Synthroid,Prilosec,Seroquel,Topamax,Effexor**   Do not wear jewelry, make-up or nail polish.  Do not wear lotions, powders, or perfumes. You may wear deodorant.  Do not shave 48 hours prior to surgery.  Do not bring valuables to the hospital.  Contacts, dentures or bridgework may not be worn into surgery.  Leave suitcase in the car. After surgery it may be brought to your room.  For patients admitted to the hospital, checkout time is 11:00 AM the day of discharge.   Patients discharged the day of surgery will not be allowed to drive home.  Name and phone number of your driver: N/A  Special Instructions: CHG Shower Use Special Wash: 1/2 bottle night before surgery and 1/2 bottle morning of surgery.   Please read over the following fact sheets that you were given: Pain Booklet, Coughing and Deep Breathing, MRSA Information and Surgical Site Infection Prevention

## 2011-04-20 NOTE — Consult Note (Signed)
Anesthesia:  Patient is a 61 year old female scheduled for a total thyroidectomy on 02/20/12 by Dr. Jenne Pane.  He requested an Anesthesia consult during her PAT visit presumable due to possible difficult intubation related to large goiter.  Her history includes obesity, multi-nodular goiter with right tracheal deviation, DM2 on insulin, hypothyroidism, hypercholesterolemia, Bipolar disorder, chronic diastolic HF, aphthous stomatitis, elevated transaminases (liver with fatty infiltration on U/S in 2010), fibromyalgia, depression GERD, former smoker, and kidney stones.  She is also prone to post-operative N/V.  She has had a CTA of the chest on 02/06/11 and a thyroid U/S on 09/29/10 which measured the right lobe at 4.9X2.6X2.0 CM and the left lobe at 11.0X4.6X4.7 CM.  Her CXR on 02/06/11 showed no acute cardiopulmonary process identified. Rightward deviation of the trachea, most in keeping with the patient's known thyroid goiter.  She has also been evaluated by Pulmonologist Dr. Marchelle Gearing due to SOB and cough.  She had PFTs showing moderate obstructive airway disease (see Media tab).  He is the one who ultimately felt that she would benefit from surgical resection of her goiter to improve her respiratory symptoms.     Her PCP is Dr. Everardo All and her Cardiologist is Dr. Swaz[place].  She recently had a normal nuclear stress test with EF 76% and echo with EF 55-60% on 03/08/11 (See Results Review tab).  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  She reportedly had a normal cath in 2001.  She denies CP.  She does have DOE with moderate activity and chronic mild LE edema R>L.  Her heart has a RRR, lungs clear, ankles show 1+ edema.  No carotid bruits were auscultated.  Her neck was large, goiter noted.  Her preoperative labs are noted.  Her H/H are 10.2/32.6.  Her last two readings in November were 12.2/36 and 10.8/33.3.  LFTs were unremarkable.  I reviewed possible means of intubation including use of a glidescope  or an awake fiberoptic intubation with the patient and also updated Anesthesiologist Dr. Chaney Malling.  She is comfortable with talking with her assigned Anesthesiologist about the definitive Anesthesia plan on the day of surgery.

## 2011-04-20 NOTE — Anesthesia Preprocedure Evaluation (Addendum)
Anesthesia Evaluation  Patient identified by MRN, date of birth, ID band Patient awake    Reviewed: Allergy & Precautions, H&P , NPO status , Patient's Chart, lab work & pertinent test results, reviewed documented beta blocker date and time   History of Anesthesia Complications (+) PONV  Airway Mallampati: III TM Distance: >3 FB Neck ROM: Full  Mouth opening: Limited Mouth Opening  Dental  (+) Partial Lower and Teeth Intact   Pulmonary shortness of breath, with exertion and lying,          Cardiovascular hypertension, Pt. on medications     Neuro/Psych PSYCHIATRIC DISORDERS Depression Bipolar Disorder  Neuromuscular disease    GI/Hepatic GERD-  Medicated and Controlled,  Endo/Other  Diabetes mellitus-, Type 2, Oral Hypoglycemic Agents and Insulin DependentHypothyroidism   Renal/GU      Musculoskeletal  (+) Fibromyalgia -  Abdominal   Peds  Hematology   Anesthesia Other Findings Seen by P Sw[place] MD in 02/2011. Had ECHO NL LV EF 65. Had NL Stress test. EKG showed RBBB and LAFB. OK for GA  Reproductive/Obstetrics                          Anesthesia Physical Anesthesia Plan  ASA: III  Anesthesia Plan: General ETT   Post-op Pain Management:    Induction:   Airway Management Planned:   Additional Equipment:   Intra-op Plan:   Post-operative Plan:   Informed Consent: I have reviewed the patients History and Physical, chart, labs and discussed the procedure including the risks, benefits and alternatives for the proposed anesthesia with the patient or authorized representative who has indicated his/her understanding and acceptance.     Plan Discussed with: CRNA and Surgeon  Anesthesia Plan Comments: (See Anesthesia consult note.  Possible difficult intubation secondary to large goiter with right tracheal deviation.  Shonna Chock, PA-C)       Anesthesia Quick Evaluation

## 2011-04-22 ENCOUNTER — Other Ambulatory Visit: Payer: Self-pay | Admitting: Otolaryngology

## 2011-04-22 ENCOUNTER — Inpatient Hospital Stay (HOSPITAL_COMMUNITY)
Admission: RE | Admit: 2011-04-22 | Discharge: 2011-04-23 | DRG: 626 | Disposition: A | Discharge status: Home / Self Care | Payer: Medicare Other | Source: Ambulatory Visit | Attending: Otolaryngology | Admitting: Otolaryngology

## 2011-04-22 ENCOUNTER — Encounter (HOSPITAL_COMMUNITY): Payer: Self-pay | Admitting: Vascular Surgery

## 2011-04-22 ENCOUNTER — Encounter (HOSPITAL_COMMUNITY)
Admission: RE | Disposition: A | Discharge status: Home / Self Care | Payer: Self-pay | Source: Ambulatory Visit | Attending: Otolaryngology

## 2011-04-22 ENCOUNTER — Ambulatory Visit (HOSPITAL_COMMUNITY): Payer: Medicare Other | Admitting: Vascular Surgery

## 2011-04-22 DIAGNOSIS — Z886 Allergy status to analgesic agent status: Secondary | ICD-10-CM

## 2011-04-22 DIAGNOSIS — E039 Hypothyroidism, unspecified: Secondary | ICD-10-CM | POA: Diagnosis present

## 2011-04-22 DIAGNOSIS — E042 Nontoxic multinodular goiter: Secondary | ICD-10-CM

## 2011-04-22 DIAGNOSIS — E041 Nontoxic single thyroid nodule: Principal | ICD-10-CM | POA: Diagnosis present

## 2011-04-22 DIAGNOSIS — E119 Type 2 diabetes mellitus without complications: Secondary | ICD-10-CM | POA: Diagnosis present

## 2011-04-22 DIAGNOSIS — K219 Gastro-esophageal reflux disease without esophagitis: Secondary | ICD-10-CM | POA: Diagnosis present

## 2011-04-22 DIAGNOSIS — IMO0001 Reserved for inherently not codable concepts without codable children: Secondary | ICD-10-CM | POA: Diagnosis present

## 2011-04-22 DIAGNOSIS — J398 Other specified diseases of upper respiratory tract: Secondary | ICD-10-CM | POA: Diagnosis present

## 2011-04-22 DIAGNOSIS — E7800 Pure hypercholesterolemia, unspecified: Secondary | ICD-10-CM | POA: Diagnosis present

## 2011-04-22 DIAGNOSIS — J988 Other specified respiratory disorders: Secondary | ICD-10-CM | POA: Diagnosis present

## 2011-04-22 DIAGNOSIS — Z794 Long term (current) use of insulin: Secondary | ICD-10-CM

## 2011-04-22 DIAGNOSIS — M069 Rheumatoid arthritis, unspecified: Secondary | ICD-10-CM | POA: Diagnosis present

## 2011-04-22 DIAGNOSIS — I10 Essential (primary) hypertension: Secondary | ICD-10-CM | POA: Diagnosis present

## 2011-04-22 DIAGNOSIS — Z780 Asymptomatic menopausal state: Secondary | ICD-10-CM

## 2011-04-22 DIAGNOSIS — Z23 Encounter for immunization: Secondary | ICD-10-CM

## 2011-04-22 DIAGNOSIS — F319 Bipolar disorder, unspecified: Secondary | ICD-10-CM | POA: Diagnosis present

## 2011-04-22 DIAGNOSIS — I509 Heart failure, unspecified: Secondary | ICD-10-CM | POA: Diagnosis present

## 2011-04-22 DIAGNOSIS — I5032 Chronic diastolic (congestive) heart failure: Secondary | ICD-10-CM | POA: Diagnosis present

## 2011-04-22 HISTORY — PX: THYROIDECTOMY: SHX17

## 2011-04-22 LAB — GLUCOSE, CAPILLARY
Glucose-Capillary: 184 mg/dL — ABNORMAL HIGH (ref 70–99)
Glucose-Capillary: 171 mg/dL — ABNORMAL HIGH (ref 70–99)
Glucose-Capillary: 169 mg/dL — ABNORMAL HIGH (ref 70–99)
Glucose-Capillary: 151 mg/dL — ABNORMAL HIGH (ref 70–99)

## 2011-04-22 SURGERY — THYROIDECTOMY
Anesthesia: General | Site: Throat | Wound class: Clean

## 2011-04-22 MED ORDER — INSULIN ASPART PROT & ASPART (70-30 MIX) 100 UNIT/ML ~~LOC~~ SUSP
30.0000 [IU] | Freq: Every day | SUBCUTANEOUS | Status: DC
  Administered 2011-04-23: 30 [IU] via SUBCUTANEOUS
  Filled 2011-04-22: qty 3

## 2011-04-22 MED ORDER — PNEUMOCOCCAL VAC POLYVALENT 25 MCG/0.5ML IJ INJ
0.5000 mL | INJECTION | INTRAMUSCULAR | Status: AC
  Administered 2011-04-23: 0.5 mL via INTRAMUSCULAR
  Filled 2011-04-22: qty 0.5

## 2011-04-22 MED ORDER — METFORMIN HCL 500 MG PO TABS
1000.0000 mg | ORAL_TABLET | Freq: Two times a day (BID) | ORAL | Status: DC
  Administered 2011-04-22 – 2011-04-23 (×2): 1000 mg via ORAL
  Filled 2011-04-22 (×4): qty 2

## 2011-04-22 MED ORDER — INSULIN PEN NEEDLE 31G X 5 MM MISC: 1.0000 | Freq: Three times a day (TID) | Status: DC

## 2011-04-22 MED ORDER — LIDOCAINE-EPINEPHRINE 1 %-1:100000 IJ SOLN
INTRAMUSCULAR | Status: DC | PRN
  Administered 2011-04-22: 6 mL

## 2011-04-22 MED ORDER — GLUCOSE BLOOD VI STRP: 1.0000 | ORAL_STRIP | Freq: Two times a day (BID) | Status: DC

## 2011-04-22 MED ORDER — EPHEDRINE SULFATE 50 MG/ML IJ SOLN
INTRAMUSCULAR | Status: DC | PRN
  Administered 2011-04-22 (×3): 10 mg via INTRAVENOUS

## 2011-04-22 MED ORDER — HYDROCODONE-ACETAMINOPHEN 5-325 MG PO TABS
1.0000 | ORAL_TABLET | ORAL | Status: DC | PRN
  Administered 2011-04-22 – 2011-04-23 (×2): 2 via ORAL
  Filled 2011-04-22 (×2): qty 2

## 2011-04-22 MED ORDER — PHENYLEPHRINE HCL 10 MG/ML IJ SOLN
INTRAMUSCULAR | Status: DC | PRN
  Administered 2011-04-22 (×5): 80 ug via INTRAVENOUS

## 2011-04-22 MED ORDER — MIRTAZAPINE 30 MG PO TABS
30.0000 mg | ORAL_TABLET | Freq: Every day | ORAL | Status: DC
  Administered 2011-04-22: 30 mg via ORAL
  Filled 2011-04-22 (×2): qty 1

## 2011-04-22 MED ORDER — VITAMIN B-1 100 MG PO TABS
100.0000 mg | ORAL_TABLET | Freq: Every day | ORAL | Status: DC
  Administered 2011-04-22 – 2011-04-23 (×2): 100 mg via ORAL
  Filled 2011-04-22 (×2): qty 1

## 2011-04-22 MED ORDER — LEVOTHYROXINE SODIUM 50 MCG PO TABS
50.0000 ug | ORAL_TABLET | Freq: Every day | ORAL | Status: DC
  Administered 2011-04-23: 50 ug via ORAL
  Filled 2011-04-22 (×2): qty 1

## 2011-04-22 MED ORDER — PANTOPRAZOLE SODIUM 40 MG PO TBEC
40.0000 mg | DELAYED_RELEASE_TABLET | Freq: Every day | ORAL | Status: DC
  Administered 2011-04-22: 40 mg via ORAL
  Filled 2011-04-22: qty 1

## 2011-04-22 MED ORDER — NAPROXEN 500 MG PO TABS
500.0000 mg | ORAL_TABLET | Freq: Two times a day (BID) | ORAL | Status: DC
  Administered 2011-04-22 – 2011-04-23 (×2): 500 mg via ORAL
  Filled 2011-04-22 (×4): qty 1

## 2011-04-22 MED ORDER — ARIPIPRAZOLE 10 MG PO TABS
20.0000 mg | ORAL_TABLET | Freq: Every day | ORAL | Status: DC
  Administered 2011-04-22: 20 mg via ORAL
  Filled 2011-04-22 (×2): qty 2

## 2011-04-22 MED ORDER — PROPOFOL 10 MG/ML IV EMUL
INTRAVENOUS | Status: DC | PRN
  Administered 2011-04-22: 200 mg via INTRAVENOUS

## 2011-04-22 MED ORDER — MAGNESIUM OXIDE 400 MG PO TABS
400.0000 mg | ORAL_TABLET | Freq: Every day | ORAL | Status: DC
Start: 2011-04-22 — End: 2011-04-23
  Administered 2011-04-22 – 2011-04-23 (×2): 400 mg via ORAL
  Filled 2011-04-22 (×2): qty 1

## 2011-04-22 MED ORDER — CEFAZOLIN SODIUM 1-5 GM-% IV SOLN
1.0000 g | Freq: Three times a day (TID) | INTRAVENOUS | Status: DC
  Filled 2011-04-22 (×3): qty 50

## 2011-04-22 MED ORDER — QUETIAPINE FUMARATE ER 300 MG PO TB24
300.0000 mg | ORAL_TABLET | Freq: Every day | ORAL | Status: DC
  Administered 2011-04-22: 300 mg via ORAL
  Filled 2011-04-22 (×2): qty 1

## 2011-04-22 MED ORDER — VITAMIN D 1000 UNITS PO TABS
2000.0000 [IU] | ORAL_TABLET | Freq: Every day | ORAL | Status: DC
  Administered 2011-04-22: 2000 [IU] via ORAL
  Filled 2011-04-22 (×2): qty 2

## 2011-04-22 MED ORDER — INSULIN ASPART PROT & ASPART (70-30 MIX) 100 UNIT/ML ~~LOC~~ SUSP
15.0000 [IU] | Freq: Every day | SUBCUTANEOUS | Status: DC
  Administered 2011-04-22: 15 [IU] via SUBCUTANEOUS
  Filled 2011-04-22: qty 3

## 2011-04-22 MED ORDER — CEFAZOLIN SODIUM 1-5 GM-% IV SOLN
INTRAVENOUS | Status: AC
  Filled 2011-04-22: qty 100

## 2011-04-22 MED ORDER — CEFAZOLIN SODIUM 1-5 GM-% IV SOLN
INTRAVENOUS | Status: DC | PRN
  Administered 2011-04-22: 2 g via INTRAVENOUS

## 2011-04-22 MED ORDER — ASPIRIN 81 MG PO CHEW
81.0000 mg | CHEWABLE_TABLET | Freq: Every day | ORAL | Status: DC
  Administered 2011-04-22: 81 mg via ORAL
  Filled 2011-04-22 (×2): qty 1

## 2011-04-22 MED ORDER — LAMOTRIGINE 200 MG PO TABS
200.0000 mg | ORAL_TABLET | Freq: Every day | ORAL | Status: DC
Start: 2011-04-22 — End: 2011-04-23
  Administered 2011-04-22: 200 mg via ORAL
  Filled 2011-04-22 (×2): qty 1

## 2011-04-22 MED ORDER — MIDAZOLAM HCL 5 MG/5ML IJ SOLN
INTRAMUSCULAR | Status: DC | PRN
  Administered 2011-04-22: 1 mg via INTRAVENOUS

## 2011-04-22 MED ORDER — CLONAZEPAM 1 MG PO TABS
2.0000 mg | ORAL_TABLET | Freq: Every day | ORAL | Status: DC
  Administered 2011-04-22: 2 mg via ORAL
  Filled 2011-04-22: qty 2

## 2011-04-22 MED ORDER — MANGANESE GLUCONATE 50 MG PO TABS: 1.0000 | ORAL_TABLET | Freq: Every day | ORAL | Status: DC

## 2011-04-22 MED ORDER — ONDANSETRON HCL 4 MG/2ML IJ SOLN
INTRAMUSCULAR | Status: DC | PRN
  Administered 2011-04-22: 4 mg via INTRAVENOUS

## 2011-04-22 MED ORDER — LIDOCAINE HCL (CARDIAC) 20 MG/ML IV SOLN
INTRAVENOUS | Status: DC | PRN
  Administered 2011-04-22: 40 mg via INTRAVENOUS

## 2011-04-22 MED ORDER — ONDANSETRON HCL 4 MG/2ML IJ SOLN: 4.0000 mg | Freq: Once | INTRAMUSCULAR | Status: DC | PRN

## 2011-04-22 MED ORDER — SUCCINYLCHOLINE CHLORIDE 20 MG/ML IJ SOLN
INTRAMUSCULAR | Status: DC | PRN
  Administered 2011-04-22: 100 mg via INTRAVENOUS

## 2011-04-22 MED ORDER — OXYBUTYNIN CHLORIDE ER 10 MG PO TB24
10.0000 mg | ORAL_TABLET | Freq: Every evening | ORAL | Status: DC
Start: 2011-04-22 — End: 2011-04-23
  Administered 2011-04-22: 10 mg via ORAL
  Filled 2011-04-22 (×2): qty 1

## 2011-04-22 MED ORDER — ADULT MULTIVITAMIN W/MINERALS CH
1.0000 | ORAL_TABLET | Freq: Every day | ORAL | Status: DC
  Administered 2011-04-22 – 2011-04-23 (×2): 1 via ORAL
  Filled 2011-04-22 (×2): qty 1

## 2011-04-22 MED ORDER — DROPERIDOL 2.5 MG/ML IJ SOLN
INTRAMUSCULAR | Status: DC | PRN
  Administered 2011-04-22: 0.625 mg via INTRAVENOUS

## 2011-04-22 MED ORDER — VENLAFAXINE HCL ER 150 MG PO CP24
150.0000 mg | ORAL_CAPSULE | ORAL | Status: DC
  Administered 2011-04-23: 150 mg via ORAL
  Filled 2011-04-22 (×2): qty 1

## 2011-04-22 MED ORDER — CEPHALEXIN 500 MG PO CAPS
500.0000 mg | ORAL_CAPSULE | Freq: Three times a day (TID) | ORAL | Status: AC
  Administered 2011-04-22 – 2011-04-23 (×3): 500 mg via ORAL
  Filled 2011-04-22 (×3): qty 1

## 2011-04-22 MED ORDER — MEPERIDINE HCL 25 MG/ML IJ SOLN: 6.2500 mg | INTRAMUSCULAR | Status: DC | PRN

## 2011-04-22 MED ORDER — LACTATED RINGERS IV SOLN
INTRAVENOUS | Status: DC | PRN
  Administered 2011-04-22 (×2): via INTRAVENOUS

## 2011-04-22 MED ORDER — CO Q 10 60 MG PO CAPS: 1.0000 | ORAL_CAPSULE | Freq: Three times a day (TID) | ORAL | Status: DC

## 2011-04-22 MED ORDER — MORPHINE SULFATE 2 MG/ML IJ SOLN: 0.0500 mg/kg | INTRAMUSCULAR | Status: DC | PRN

## 2011-04-22 MED ORDER — PROMETHAZINE HCL 25 MG PO TABS: 12.5000 mg | ORAL_TABLET | Freq: Four times a day (QID) | ORAL | Status: DC | PRN

## 2011-04-22 MED ORDER — FUROSEMIDE 40 MG PO TABS
40.0000 mg | ORAL_TABLET | Freq: Every day | ORAL | Status: DC
  Administered 2011-04-22: 40 mg via ORAL
  Filled 2011-04-22 (×2): qty 1

## 2011-04-22 MED ORDER — QUETIAPINE FUMARATE ER 50 MG PO TB24: 150.0000 mg | ORAL_TABLET | Freq: Two times a day (BID) | ORAL | Status: DC

## 2011-04-22 MED ORDER — LIDOCAINE HCL 4 % MT SOLN
OROMUCOSAL | Status: DC | PRN
  Administered 2011-04-22: 4 mL via TOPICAL

## 2011-04-22 MED ORDER — HYDROCHLOROTHIAZIDE 25 MG PO TABS
25.0000 mg | ORAL_TABLET | ORAL | Status: DC
  Administered 2011-04-23: 25 mg via ORAL
  Filled 2011-04-22 (×2): qty 1

## 2011-04-22 MED ORDER — SIMVASTATIN 40 MG PO TABS
40.0000 mg | ORAL_TABLET | Freq: Every day | ORAL | Status: DC
  Administered 2011-04-22 – 2011-04-23 (×2): 40 mg via ORAL
  Filled 2011-04-22 (×2): qty 1

## 2011-04-22 MED ORDER — HYDROMORPHONE HCL PF 1 MG/ML IJ SOLN
0.2500 mg | INTRAMUSCULAR | Status: DC | PRN
  Administered 2011-04-22 (×2): 0.5 mg via INTRAVENOUS

## 2011-04-22 MED ORDER — TOPIRAMATE 100 MG PO TABS
100.0000 mg | ORAL_TABLET | Freq: Two times a day (BID) | ORAL | Status: DC
  Administered 2011-04-22 – 2011-04-23 (×3): 100 mg via ORAL
  Filled 2011-04-22 (×5): qty 1

## 2011-04-22 MED ORDER — INFLUENZA VIRUS VACC SPLIT PF IM SUSP
0.5000 mL | INTRAMUSCULAR | Status: AC
  Administered 2011-04-23: 0.5 mL via INTRAMUSCULAR
  Filled 2011-04-22: qty 0.5

## 2011-04-22 MED ORDER — QUETIAPINE FUMARATE ER 50 MG PO TB24
150.0000 mg | ORAL_TABLET | Freq: Every day | ORAL | Status: DC
  Administered 2011-04-23: 150 mg via ORAL
  Filled 2011-04-22 (×2): qty 3

## 2011-04-22 MED ORDER — FENTANYL CITRATE 0.05 MG/ML IJ SOLN
INTRAMUSCULAR | Status: DC | PRN
  Administered 2011-04-22: 100 ug via INTRAVENOUS

## 2011-04-22 MED ORDER — INDOMETHACIN ER 75 MG PO CPCR
75.0000 mg | ORAL_CAPSULE | Freq: Four times a day (QID) | ORAL | Status: DC | PRN
  Filled 2011-04-22: qty 1

## 2011-04-22 MED ORDER — ZOLPIDEM TARTRATE 5 MG PO TABS: 5.0000 mg | ORAL_TABLET | Freq: Every evening | ORAL | Status: DC | PRN

## 2011-04-22 MED ORDER — PROMETHAZINE HCL 25 MG RE SUPP: 12.5000 mg | Freq: Four times a day (QID) | RECTAL | Status: DC | PRN

## 2011-04-22 MED ORDER — 0.9 % SODIUM CHLORIDE (POUR BTL) OPTIME
TOPICAL | Status: DC | PRN
  Administered 2011-04-22: 1000 mL

## 2011-04-22 MED ORDER — POTASSIUM CITRATE ER 10 MEQ (1080 MG) PO TBCR
20.0000 meq | EXTENDED_RELEASE_TABLET | Freq: Two times a day (BID) | ORAL | Status: DC
  Administered 2011-04-22 – 2011-04-23 (×3): 20 meq via ORAL
  Filled 2011-04-22 (×4): qty 2

## 2011-04-22 MED ORDER — BIOTIN 5000 MCG PO CAPS: 1.0000 | ORAL_CAPSULE | Freq: Three times a day (TID) | ORAL | Status: DC

## 2011-04-22 MED ORDER — INSULIN ASPART PROT & ASPART (70-30 MIX) 100 UNIT/ML ~~LOC~~ SUSP: 15.0000 [IU] | Freq: Two times a day (BID) | SUBCUTANEOUS | Status: DC

## 2011-04-22 MED ORDER — MORPHINE SULFATE 4 MG/ML IJ SOLN
2.0000 mg | INTRAMUSCULAR | Status: DC | PRN
  Administered 2011-04-22: 2 mg via INTRAVENOUS
  Filled 2011-04-22: qty 1

## 2011-04-22 MED ORDER — ALBUTEROL SULFATE HFA 108 (90 BASE) MCG/ACT IN AERS
2.0000 | INHALATION_SPRAY | Freq: Four times a day (QID) | RESPIRATORY_TRACT | Status: DC | PRN
  Filled 2011-04-22: qty 6.7

## 2011-04-22 MED ORDER — ESTROGENS CONJUGATED 0.3 MG PO TABS
0.3000 mg | ORAL_TABLET | Freq: Every day | ORAL | Status: DC
  Administered 2011-04-22 – 2011-04-23 (×2): 0.3 mg via ORAL
  Filled 2011-04-22 (×2): qty 1

## 2011-04-22 SURGICAL SUPPLY — 58 items
APPLIER CLIP 9.375 SM OPEN (CLIP) ×2
ATTRACTOMAT 16X20 MAGNETIC DRP (DRAPES) IMPLANT
BENZOIN TINCTURE PRP APPL 2/3 (GAUZE/BANDAGES/DRESSINGS) IMPLANT
BLADE SURG 15 STRL LF DISP TIS (BLADE) ×1 IMPLANT
BLADE SURG 15 STRL SS (BLADE) ×1
CANISTER SUCTION 2500CC (MISCELLANEOUS) ×2 IMPLANT
CLEANER TIP ELECTROSURG 2X2 (MISCELLANEOUS) ×2 IMPLANT
CLIP APPLIE 9.375 SM OPEN (CLIP) ×1 IMPLANT
CLOTH BEACON ORANGE TIMEOUT ST (SAFETY) ×2 IMPLANT
CORDS BIPOLAR (ELECTRODE) ×4 IMPLANT
COVER SURGICAL LIGHT HANDLE (MISCELLANEOUS) ×2 IMPLANT
CRADLE DONUT ADULT HEAD (MISCELLANEOUS) IMPLANT
DERMABOND ADHESIVE PROPEN (GAUZE/BANDAGES/DRESSINGS) ×1
DERMABOND ADVANCED (GAUZE/BANDAGES/DRESSINGS)
DERMABOND ADVANCED .7 DNX12 (GAUZE/BANDAGES/DRESSINGS) IMPLANT
DERMABOND ADVANCED .7 DNX6 (GAUZE/BANDAGES/DRESSINGS) ×1 IMPLANT
DRAIN JACKSON RD 7FR 3/32 (WOUND CARE) IMPLANT
DRAIN SNY 10 ROU (WOUND CARE) IMPLANT
ELECT COATED BLADE 2.86 ST (ELECTRODE) ×2 IMPLANT
ELECT REM PT RETURN 9FT ADLT (ELECTROSURGICAL) ×2
ELECTRODE REM PT RTRN 9FT ADLT (ELECTROSURGICAL) ×1 IMPLANT
EVACUATOR SILICONE 100CC (DRAIN) ×2 IMPLANT
GAUZE SPONGE 4X4 16PLY XRAY LF (GAUZE/BANDAGES/DRESSINGS) ×2 IMPLANT
GLOVE BIO SURGEON STRL SZ7.5 (GLOVE) ×4 IMPLANT
GLOVE BIOGEL PI IND STRL 6.5 (GLOVE) ×2 IMPLANT
GLOVE BIOGEL PI IND STRL 7.0 (GLOVE) ×1 IMPLANT
GLOVE BIOGEL PI IND STRL 7.5 (GLOVE) ×1 IMPLANT
GLOVE BIOGEL PI INDICATOR 6.5 (GLOVE) ×2
GLOVE BIOGEL PI INDICATOR 7.0 (GLOVE) ×1
GLOVE BIOGEL PI INDICATOR 7.5 (GLOVE) ×1
GLOVE ECLIPSE 6.5 STRL STRAW (GLOVE) ×2 IMPLANT
GLOVE ECLIPSE 7.5 STRL STRAW (GLOVE) ×2 IMPLANT
GLOVE SURG SS PI 6.5 STRL IVOR (GLOVE) ×4 IMPLANT
GLOVE SURG SS PI 7.0 STRL IVOR (GLOVE) ×2 IMPLANT
GOWN PREVENTION PLUS XLARGE (GOWN DISPOSABLE) ×2 IMPLANT
GOWN STRL NON-REIN LRG LVL3 (GOWN DISPOSABLE) ×8 IMPLANT
KIT BASIN OR (CUSTOM PROCEDURE TRAY) ×2 IMPLANT
KIT ROOM TURNOVER OR (KITS) ×2 IMPLANT
LOCATOR NERVE 3 VOLT (DISPOSABLE) IMPLANT
MARKER SKIN DUAL TIP RULER LAB (MISCELLANEOUS) ×2 IMPLANT
NS IRRIG 1000ML POUR BTL (IV SOLUTION) ×2 IMPLANT
PAD ARMBOARD 7.5X6 YLW CONV (MISCELLANEOUS) ×4 IMPLANT
PENCIL BUTTON HOLSTER BLD 10FT (ELECTRODE) ×2 IMPLANT
SPECIMEN JAR MEDIUM (MISCELLANEOUS) ×2 IMPLANT
SPONGE INTESTINAL PEANUT (DISPOSABLE) IMPLANT
STAPLER VISISTAT 35W (STAPLE) ×2 IMPLANT
SUT ETHILON 2 0 FS 18 (SUTURE) ×2 IMPLANT
SUT MNCRL AB 4-0 PS2 18 (SUTURE) IMPLANT
SUT SILK 3 0 REEL (SUTURE) ×2 IMPLANT
SUT VIC AB 3-0 FS2 27 (SUTURE) ×2 IMPLANT
SUT VIC AB 3-0 SH 27 (SUTURE) ×1
SUT VIC AB 3-0 SH 27X BRD (SUTURE) ×1 IMPLANT
SUT VICRYL 4-0 PS2 18IN ABS (SUTURE) ×2 IMPLANT
TOWEL OR 17X24 6PK STRL BLUE (TOWEL DISPOSABLE) ×2 IMPLANT
TOWEL OR 17X26 10 PK STRL BLUE (TOWEL DISPOSABLE) ×2 IMPLANT
TRAY ENT MC OR (CUSTOM PROCEDURE TRAY) ×2 IMPLANT
TRAY FOLEY CATH 14FRSI W/METER (CATHETERS) IMPLANT
WATER STERILE IRR 1000ML POUR (IV SOLUTION) ×2 IMPLANT

## 2011-04-22 NOTE — Op Note (Signed)
Brittany Acosta, Brittany Acosta                 ACCOUNT NO.:  [id number]  MEDICAL RECORD NO.:  [id number]  LOCATION:  [facility]                         FACILITY:  [facility]  PHYSICIAN:  Antony Contras, MD     DATE OF BIRTH:  30-Nov-1950  DATE OF PROCEDURE:  04/22/2011 DATE OF DISCHARGE:                              OPERATIVE REPORT   PREOPERATIVE DIAGNOSES: 1. Thyroid goiter. 2. Tracheal stenosis.  POSTOPERATIVE DIAGNOSES: 1. Thyroid goiter. 2. Tracheal stenosis.  PROCEDURE:  Left thyroid lobectomy.  SURGEON:  Antony Contras, MD  ASSISTANT:  Suzanna Obey, MD  ANESTHESIA:  General endotracheal anesthesia.  COMPLICATIONS:  None.  INDICATION:  The patient is a 61 year old white female who has been followed for thyroid goiter by Dr. Everardo All.  She has developed difficulty breathing and was evaluated at the emergency department.  A CT scan demonstrated tracheal narrowing at the level of the thyroid goiter due to the left side enlargement.  Pulmonary function testing with Dr. Marchelle Gearing demonstrated an obstructive airway.  Thus, she presents to the operating room for surgical management.  The original plan was to remove the total thyroid but with difficulty removing the left side, the surgery was stopped after removing that side.  The right side of the gland was palpated and not enlarged significantly.  FINDINGS:  There was a large left-sided thyroid goiter involving the isthmus as well.  There was a firm area superiorly and a softer area inferiorly.  The inferior area extended down into the thoracic inlet. Superior parathyroid gland was identified and kept in place.  The recurrent laryngeal nerve was not clearly identified, and there was a structure that was divided while dividing the vessels that was concerning to be possibly the nerve.  This structure was stretched around the thyroid goiter and was not in the tracheoesophageal groove. It was because of this concern that the right-sided thyroid lobe  was left in place for taking on unnecessary risk.  DESCRIPTION OF PROCEDURE:  The patient was identified in the holding room.  Informed consent having been obtained, including discussion of risks, benefits, alternatives, the patient was brought to the operative suite and put on the operative table in supine position.  Anesthesia was induced, and the patient was intubated by anesthesia team without difficulty.  The patient was given intravenous antibiotics during the case.  The eyes were taped and closed and a shoulder roll was placed. The incision was marked with a marking pen and injected with 1% lidocaine with 1:100,000 epinephrine.  The neck was then prepped and draped in sterile fashion.  An incision was made with a 15-blade scalpel and extended through the subcutaneous layer and platysmal layer using Bovie electrocautery.  Subplatysmal flaps were elevated superiorly and inferiorly.  A self-retaining retractor was added.  The midline raphe of the strap muscles was divided and the left-sided strap muscles were retracted laterally, exposing the thyroid gland.  Dissection was then performed over the anterior surface of the thyroid gland and around to the lateral edge using bipolar to control bleeding.  Finger dissection was then performed around the lateral edge and down toward the inferior portion.  Superiorly, dissection was performed carefully with  hemoclips and bipolar electrocautery until the superior pedicle was identified. There were very large vessels making up superior pedicle that were then divided and ligated in a step-by-step fashion until the superior pole was freed.  Careful dissection was then performed around the superior edge of the gland and this is when the superior parathyroid gland was identified.  It was dissected out of a groove in the thyroid gland. Vessels were then further dissected and ligated and divided until the superior pole was free.  It was in this  process that the structure was identified that may represent recurrent nerve.  At this point, with the superior pole 3, finger dissection was further performed around the inferior part of the gland down into the thoracic inlet and done until the mass of the thyroid goiter was able to be rolled out and removed. This was passed to nursing for pathology.  This came out separate from the remainder of the left lobe.  Careful dissection was then performed under the undersurface of the remaining left lobe and dividing vessels to berry ligament and then the isthmus was divided using Bovie electrocautery.  Remainder of the left lobe was passed to nursing also for pathology.  The right-sided strap muscles were then dissected over the right thyroid lobe and it was palpated.  There was no discrete mass and the lobe was not particularly enlarged.  Because of concern of the left-sided recurrent nerve, the procedure was then aborted and the right lobe left in place.  The left-sided dissection site was packed for several minutes and then packing was removed.  The area was copiously irrigated.  The dissection site was then carefully examined and bleeding sites were then controlled with bipolar electrocautery or ligature. This was tedious in the superior pedicle region.  Valsalva was given and additional bleeding controlled.  Irrigation was again used.  Once bleeding was under control, a 10-French drain was placed in the wound and secured to the left-sided incision using 2-0 nylon suture in a standard drain stitch.  The midline raphe was then closed with 3-0 Vicryl suture in a simple interrupted fashion.  The platysmal layer was also closed with 3-0 Vicryl suture in a simple interrupted fashion.  The subcutaneous layer was closed with 4-0 Vicryl in a simple interrupted fashion.  The skin was closed with Dermabond.  The drain was hooked to suction during closure and then changed to bulb suction.  The  patient was then cleaned off, and the drain was attached to the left shoulder. She was returned to anesthesia for wake-up and was extubated and moved to recovery room in stable condition.  Pressure was held against the neck during wake-up.     Antony Contras, MD     DDB/MEDQ  D:  04/22/2011  T:  04/22/2011  Job:  [id number]

## 2011-04-22 NOTE — H&P (Signed)
Brittany Acosta is an 61 y.o. female.   Chief Complaint: thyroid goiter, tracheal narrowing HPI: 61 year old with long history of thyroid goiter found to be narrowing trachea on CT imaging.  Pulmonary testing demonstrates obstruction and she is symptomatic.  Past Medical History  Diagnosis Date  . GOITER, MULTINODULAR   . HYPOTHYROIDISM   . HYPERCHOLESTEROLEMIA   . Morbid obesity   . BIPOLAR DISORDER UNSPECIFIED   . Chronic diastolic heart failure   . ALLERGIC RHINITIS   . APHTHOUS STOMATITIS   . TRANSAMINASES, SERUM, ELEVATED   . ASYMPTOMATIC POSTMENOPAUSAL STATUS   . FIBROMYALGIA   . PONV (postoperative nausea and vomiting)   . IDDM     Type  2 DM x 10 years  . Chronic kidney disease     kidney stones; sees urologist @ [facility]  . Fibromyalgia   . Depression     Bipolar disorder  . Arthritis     rheumatoid arthritis knees and shoulders  . GERD (gastroesophageal reflux disease)   . Shortness of breath     exertional  . History of wrist fracture     rt wrist    Past Surgical History  Procedure Date  . Cholecystectomy 1985  . Abdominal hysterectomy 1976  . Tubal ligation 1972  . Left ovary removed 1980  . Right nasal surgery 08/1988  . Right sinus removed 08/1989    tooth partial  . Percitania stone removed (l) kidney 1992  . Lithotripsy (l) kidney 1997  . Left knee arthroscopy      Partial medical menisectomy  . Left cystoscopy  1990  . Right knee surgery  08/2009  . Eye surgery 2012    Bil cataract Ext  . Breast surgery 2000    Breast bx- Left  . Cardiac catheterization 2001    sees Dr Peter [place]    Family History  Problem Relation Age of Onset  . Hypertension Mother   . Diabetes Father   . Hypertension Father   . Hyperlipidemia Father   . Heart attack Other   . Coronary artery disease Other    Social History:  reports that she quit smoking about 28 years ago. Her smoking use included Cigarettes. She has a 6 pack-year smoking history. She has never used  smokeless tobacco. She reports that she does not drink alcohol or use illicit drugs.  Allergies: No Known Allergies  Medications Prior to Admission  Medication Dose Route Frequency Provider Last Rate Last Dose  . 0.9 % irrigation (POUR BTL)    PRN Antony Contras, MD   1,000 mL at 04/22/11 0732   Medications Prior to Admission  Medication Sig Dispense Refill  . ARIPiprazole (ABILIFY) 20 MG tablet Take 20 mg by mouth at bedtime.        Marland Kitchen aspirin 81 MG tablet Take 81 mg by mouth at bedtime.       Marland Kitchen atorvastatin (LIPITOR) 20 MG tablet Take 1 tablet (20 mg total) by mouth daily.  90 tablet  3  . Biotin 5000 MCG CAPS Take 1 capsule by mouth 3 (three) times daily.        . Calcium Carbonate-Vitamin D (CALCIUM 600 + D PO) Take 1 tablet by mouth 2 (two) times daily.        . Cholecalciferol (VITAMIN D) 2000 UNITS CAPS Take 1 capsule by mouth at bedtime.        . clonazePAM (KLONOPIN) 1 MG tablet Take 2 mg by mouth at bedtime.       Marland Kitchen  Coenzyme Q10 (CO Q 10) 60 MG CAPS Take 1 capsule by mouth 3 (three) times daily.        . furosemide (LASIX) 40 MG tablet Take 1 tablet (40 mg total) by mouth daily.  90 tablet  2  . glucose blood (ONE TOUCH ULTRA TEST) test strip 1 each by Other route 2 (two) times daily. Use as instructed to test blood sugar 2 times daily dx 250.01, and lancets 2/day  180 each  3  . hydrochlorothiazide 25 MG tablet Take 25 mg by mouth every morning.        . indomethacin (INDOCIN SR) 75 MG CR capsule Take 75 mg by mouth 4 (four) times daily as needed. For gout.      . insulin aspart protamine-insulin aspart (NOVOLOG 70/30) (70-30) 100 UNIT/ML injection Inject 15-30 Units into the skin 2 (two) times daily before a meal. 30 units before breakfast, and 15 units before the evening meal, and pen needles 2/day.       . Insulin Pen Needle (B-D UF III MINI PEN NEEDLES) 31G X 5 MM MISC 1 each by Other route 3 (three) times daily.  300 each  3  . Lactobacillus (ULTIMATE PROBIOTIC FORMULA PO) Take  1 capsule by mouth daily.        Marland Kitchen lamoTRIgine (LAMICTAL) 200 MG tablet Take 200 mg by mouth daily at 12 noon.        Marland Kitchen levothyroxine (SYNTHROID) 50 MCG tablet Take 1 tablet (50 mcg total) by mouth daily.  90 tablet  2  . Manganese Gluconate 50 MG TABS Take 1 tablet by mouth daily.        . metFORMIN (GLUCOPHAGE) 1000 MG tablet TAKE 1 TABLET EVERY        MORNING AND 1 TABLET IN THEEVENING  180 tablet  2  . mirtazapine (REMERON) 30 MG tablet Take 30 mg by mouth at bedtime.        . Multiple Vitamin (MULTIVITAMIN) tablet Take 1 tablet by mouth daily.        . naproxen (NAPROSYN) 500 MG tablet Take 1 tablet (500 mg total) by mouth 2 (two) times daily with a meal.  180 tablet  1  . oxybutynin (DITROPAN-XL) 10 MG 24 hr tablet Take 10 mg by mouth every evening.        . potassium citrate (UROCIT-K 10) 10 MEQ (1080 MG) SR tablet Take 20 mEq by mouth 2 (two) times daily.       Marland Kitchen PREMARIN 0.3 MG tablet TAKE 1 TABLET DAILY  90 tablet  1  . QUEtiapine Fumarate (SEROQUEL XR) 150 MG TB24 Take 150-450 mg by mouth 2 (two) times daily. Take 1 every morning and 3  At bedtime      . Thiamine HCl (VITAMIN B-1) 100 MG tablet Take 100 mg by mouth daily.        Marland Kitchen topiramate (TOPAMAX) 100 MG tablet Take 100 mg by mouth 2 (two) times daily.        Marland Kitchen venlafaxine (EFFEXOR-XR) 150 MG 24 hr capsule Take 150 mg by mouth every morning.        Marland Kitchen albuterol (PROVENTIL,VENTOLIN) 90 MCG/ACT inhaler Inhale 2 puffs into the lungs every 6 (six) hours as needed for wheezing or shortness of breath.  17 g  0    Results for orders placed during the hospital encounter of 04/22/11 (from the past 48 hour(s))  GLUCOSE, CAPILLARY     Status: Abnormal   Collection Time  04/22/11  6:16 AM      Component Value Range Comment   Glucose-Capillary 184 (*) 70 - 99 (mg/dL)    No results found.  Review of Systems  All other systems reviewed and are negative.    Blood pressure 131/74, pulse 82, temperature 98.1 F (36.7 C), temperature  source Oral, resp. rate 18, SpO2 97.00%. Physical Exam  Constitutional: She is oriented to person, place, and time. She appears well-developed and well-nourished.  HENT:  Head: Normocephalic and atraumatic.  Right Ear: External ear normal.  Left Ear: External ear normal.  Nose: Nose normal.  Mouth/Throat: Oropharynx is clear and moist.  Eyes: Conjunctivae and EOM are normal. Pupils are equal, round, and reactive to light.  Neck: Normal range of motion. Neck supple. Thyromegaly present.  Cardiovascular: Normal rate and regular rhythm.   Respiratory: Effort normal.  GI:       Did not examine.  Genitourinary:       Did not examine.  Musculoskeletal: Normal range of motion.  Neurological: She is alert and oriented to person, place, and time. No cranial nerve deficit.  Skin: Skin is warm and dry.  Psychiatric: She has a normal mood and affect. Her behavior is normal. Judgment and thought content normal.     Assessment/Plan Thyroid goiter with narrowed trachea. To OR for total thyroidectomy.  Overnight admission.  Khing Belcher D 04/22/2011, 7:39 AM

## 2011-04-22 NOTE — Brief Op Note (Signed)
04/22/2011  9:57 AM  PATIENT:  Brittany Acosta  61 y.o. female  PRE-OPERATIVE DIAGNOSIS:  Thyroid nodule [241.0] Tracheal stenosis [519.19]  POST-OPERATIVE DIAGNOSIS:  Thyroid nodule [241.0] Tracheal stenosis [519.19]  PROCEDURE:  Procedure(s): LEFT THYROID LOBECTOMY  SURGEON:  Surgeon(s): Antony Contras, MD Leonette Most, MD  PHYSICIAN ASSISTANT:   ASSISTANTS: Suzanna Obey, MD   ANESTHESIA:   general  EBL:  Total I/O In: 1000 [I.V.:1000] Out: 200 [Blood:200]  BLOOD ADMINISTERED:none  DRAINS: () Jackson-Pratt drain(s) with closed bulb suction in the neck   LOCAL MEDICATIONS USED:  XYLOCAINE 6CC  SPECIMEN:  Source of Specimen:  Left thyroid lobe  DISPOSITION OF SPECIMEN:  PATHOLOGY  COUNTS:  YES  TOURNIQUET:  * No tourniquets in log *  DICTATION: .Other Dictation: Dictation Number [id number]  PLAN OF CARE: Admit for overnight observation  PATIENT DISPOSITION:  PACU - hemodynamically stable.   Delay start of Pharmacological VTE agent (>24hrs) due to surgical blood loss or risk of bleeding:  {YES/NO/NOT APPLICABLE:20182

## 2011-04-22 NOTE — Transfer of Care (Signed)
Immediate Anesthesia Transfer of Care Note  Patient: Brittany Acosta  Procedure(s) Performed:  THYROIDECTOMY - TOTAL THYROIDECOTMY  Patient Location: PACU  Anesthesia Type: General  Level of Consciousness: awake, alert , oriented and patient cooperative  Airway & Oxygen Therapy: Patient Spontanous Breathing and Patient connected to face mask oxygen  Post-op Assessment: Report given to PACU RN, Post -op Vital signs reviewed and stable and Patient moving all extremities  Post vital signs: Reviewed and stable Filed Vitals:   04/22/11 0613  BP: 131/74  Pulse: 82  Temp: 36.7 C  Resp: 18    Complications: No apparent anesthesia complications

## 2011-04-22 NOTE — Progress Notes (Signed)
Subjective: POD#0 from left thyroid lobectomy for compressive goiter with tracheal compression. Subjectively doing well, denies any shortness of breath. Awake and alert. JP has been emptied twice since OR.  Objective: Vital signs in last 24 hours: Temp:  [97.1 F (36.2 C)-98.4 F (36.9 C)] 97.9 F (36.6 C) (01/17 1400) Pulse Rate:  [82-103] 92  (01/17 1400) Resp:  [13-43] 18  (01/17 1400) BP: (99-133)/(43-74) 119/56 mmHg (01/17 1400) SpO2:  [95 %-98 %] 98 % (01/17 1400) Weight:  [111.131 kg (245 lb)] 111.131 kg (245 lb) (01/17 1015)  Awake, alert, no acute distress, CN 2-12 grossly intact and symmetric. Voice is strong with no dysphonia, no stridor, no stertor. Neck is supple, flat, no ecchymosis, no hematoma. JP is holding bulb suction with 25mL serosanguinous drainage. Trachea midline. Oral cavity clear.  Basename 04/20/11 1314  NA 141  K 3.5  CL 99  CO2 32  GLUCOSE 82  BUN 17  CREATININE 0.94  CALCIUM 9.5    Medications:  Scheduled Meds:   . ARIPiprazole  20 mg Oral QHS  . aspirin  81 mg Oral QHS  . cephALEXin  500 mg Oral Q8H  . cholecalciferol  2,000 Units Oral QHS  . clonazePAM  2 mg Oral QHS  . estrogens (conjugated)  0.3 mg Oral Daily  . furosemide  40 mg Oral Daily  . hydrochlorothiazide  25 mg Oral Q0700  . influenza  inactive virus vaccine  0.5 mL Intramuscular Tomorrow-1000  . insulin aspart protamine-insulin aspart  15 Units Subcutaneous Q supper  . insulin aspart protamine-insulin aspart  30 Units Subcutaneous Q breakfast  . lamoTRIgine  200 mg Oral Q1200  . levothyroxine  50 mcg Oral Daily  . magnesium oxide  400 mg Oral Daily  . metFORMIN  1,000 mg Oral BID WC  . mirtazapine  30 mg Oral QHS  . mulitivitamin with minerals  1 tablet Oral Daily  . naproxen  500 mg Oral BID WC  . oxybutynin  10 mg Oral QPM  . pantoprazole  40 mg Oral Q1200  . pneumococcal 23 valent vaccine  0.5 mL Intramuscular Tomorrow-1000  . potassium citrate  20 mEq Oral BID  .  QUEtiapine  150 mg Oral Daily  . QUEtiapine  300 mg Oral QHS  . simvastatin  40 mg Oral Daily  . thiamine  100 mg Oral Daily  . topiramate  100 mg Oral BID  . venlafaxine  150 mg Oral Q0700  . DISCONTD: Biotin  1 capsule Oral TID  . DISCONTD:  ceFAZolin (ANCEF) IV  1 g Intravenous Q8H  . DISCONTD: Co Q 10  1 capsule Oral TID  . DISCONTD: glucose blood  1 each Other BID  . DISCONTD: insulin aspart protamine-insulin aspart  15-30 Units Subcutaneous BID AC  . DISCONTD: Insulin Pen Needle  1 each Other TID  . DISCONTD: Manganese Gluconate  1 tablet Oral Daily  . DISCONTD: QUEtiapine Fumarate  150-450 mg Oral BID   Continuous Infusions:  PRN Meds:.albuterol, HYDROcodone-acetaminophen, indomethacin, morphine, promethazine, promethazine, zolpidem, DISCONTD: 0.9 % irrigation (POUR BTL), DISCONTD: HYDROmorphone, DISCONTD: lidocaine-EPINEPHrine, DISCONTD: meperidine, DISCONTD: morphine, DISCONTD: ondansetron (ZOFRAN) IV  Assessment/Plan: POD#0 from left thyroid lobectomy. Doing well, no signs of stridor or dysphonia. Will monitor JP output, encourage PO intake and ambulation.   LOS: 0 days   Melvenia Beam 04/22/2011, 5:20 PM

## 2011-04-22 NOTE — Preoperative (Signed)
Beta Blockers   Reason not to administer Beta Blockers:Not Applicable 

## 2011-04-22 NOTE — Anesthesia Postprocedure Evaluation (Signed)
Anesthesia Post Note  Patient: Brittany Acosta  Procedure(s) Performed:  THYROIDECTOMY - TOTAL THYROIDECOTMY  Anesthesia type: general  Patient location: PACU  Post pain: Pain level controlled  Post assessment: Patient's Cardiovascular Status Stable  Last Vitals:  Filed Vitals:   04/22/11 1115  BP:   Pulse: 98  Temp:   Resp: 20    Post vital signs: Reviewed and stable  Level of consciousness: sedated  Complications: No apparent anesthesia complications

## 2011-04-23 LAB — GLUCOSE, CAPILLARY
Glucose-Capillary: 118 mg/dL — ABNORMAL HIGH (ref 70–99)
Glucose-Capillary: 151 mg/dL — ABNORMAL HIGH (ref 70–99)

## 2011-04-23 MED ORDER — HYDROCODONE-ACETAMINOPHEN 5-325 MG PO TABS: 1.0000 | ORAL_TABLET | ORAL | Status: AC | PRN

## 2011-04-23 NOTE — Discharge Summary (Signed)
Physician Discharge Summary  Patient ID: Brittany Acosta MRN: [id number] DOB/AGE: 1950-12-05 61 y.o.  Admit date: 04/22/2011 Discharge date: 04/23/2011  Admission Diagnoses: Thyroid goiter, tracheal stenosis  Discharge Diagnoses: Same Active Problems:  * No active hospital problems. *    Discharged Condition: good  Hospital Course: 61 year old with thyroid goiter and tracheal compression with difficulty breathing.  Taken to OR for thyroidectomy where left side removed.  Observed overnight with JP drain in place.  Did well overnight with reasonable degree of pain.  Voice is normal.  Tolerating oral intake well.  Out of bed well.  Drain removed on POD 1 with 125 cc of output and stable for discharge home.  Consults: none  Significant Diagnostic Studies: None.  Treatments: analgesia: Morphine  Discharge Exam: Blood pressure 130/66, pulse 87, temperature 98.1 F (36.7 C), temperature source Oral, resp. rate 18, weight 111.131 kg (245 lb), SpO2 98.00%. General appearance: alert, cooperative and no distress Neck: thyroid not enlarged, symmetric, no tenderness/mass/nodules and neck incision clean and intact.  Area soft with no sign of hematoma. Drain removed.  Voice normal.  No stridor.  Disposition: Home or Self Care  Discharge Orders    Future Appointments: Provider: Department: Dept Phone: Center:   06/14/2011 10:00 AM Rene Paci, MD [facility] [phone] [facility]     Future Orders Please Complete By Expires   Diet - low sodium heart healthy      Increase activity slowly      Discharge instructions      Comments:   Do not apply ointment to incision.  OK to get it wet.  Pat dry gently.   Call MD for:  temperature >100.4      Call MD for:  redness, tenderness, or signs of infection (pain, swelling, redness, odor or green/yellow discharge around incision site)        Medication List  As of 04/23/2011  9:11 AM   TAKE these medications         albuterol 90 MCG/ACT inhaler   Commonly known as: PROVENTIL,VENTOLIN   Inhale 2 puffs into the lungs every 6 (six) hours as needed for wheezing or shortness of breath.      ARIPiprazole 20 MG tablet   Commonly known as: ABILIFY   Take 20 mg by mouth at bedtime.      aspirin 81 MG tablet   Take 81 mg by mouth at bedtime.      atorvastatin 20 MG tablet   Commonly known as: LIPITOR   Take 1 tablet (20 mg total) by mouth daily.      Biotin 5000 MCG Caps   Take 1 capsule by mouth 3 (three) times daily.      CALCIUM 600 + D PO   Take 1 tablet by mouth 2 (two) times daily.      clonazePAM 1 MG tablet   Commonly known as: KLONOPIN   Take 2 mg by mouth at bedtime.      Co Q 10 60 MG Caps   Take 1 capsule by mouth 3 (three) times daily.      furosemide 40 MG tablet   Commonly known as: LASIX   Take 1 tablet (40 mg total) by mouth daily.      glucose blood test strip   1 each by Other route 2 (two) times daily. Use as instructed to test blood sugar 2 times daily dx 250.01, and lancets 2/day      hydrochlorothiazide 25 MG tablet  Commonly known as: HYDRODIURIL   Take 25 mg by mouth every morning.      HYDROcodone-acetaminophen 5-325 MG per tablet   Commonly known as: NORCO   Take 1-2 tablets by mouth every 4 (four) hours as needed.      indomethacin 75 MG CR capsule   Commonly known as: INDOCIN SR   Take 75 mg by mouth 4 (four) times daily as needed. For gout.      insulin aspart protamine-insulin aspart (70-30) 100 UNIT/ML injection   Commonly known as: NOVOLOG 70/30   Inject 15-30 Units into the skin 2 (two) times daily before a meal. 30 units before breakfast, and 15 units before the evening meal, and pen needles 2/day.      Insulin Pen Needle 31G X 5 MM Misc   1 each by Other route 3 (three) times daily.      lamoTRIgine 200 MG tablet   Commonly known as: LAMICTAL   Take 200 mg by mouth daily at 12 noon.      levothyroxine 50 MCG tablet   Commonly known as: SYNTHROID, LEVOTHROID   Take 1 tablet  (50 mcg total) by mouth daily.      Magnesium 100 MG Tabs   Take 1 tablet by mouth 3 (three) times daily.      Manganese Gluconate 50 MG Tabs   Take 1 tablet by mouth daily.      metFORMIN 1000 MG tablet   Commonly known as: GLUCOPHAGE   TAKE 1 TABLET EVERY        MORNING AND 1 TABLET IN THEEVENING      mirtazapine 30 MG tablet   Commonly known as: REMERON   Take 30 mg by mouth at bedtime.      multivitamin tablet   Take 1 tablet by mouth daily.      naproxen 500 MG tablet   Commonly known as: NAPROSYN   Take 1 tablet (500 mg total) by mouth 2 (two) times daily with a meal.      omeprazole 20 MG capsule   Commonly known as: PRILOSEC   Take 1 capsule (20 mg total) by mouth 2 (two) times daily.      oxybutynin 10 MG 24 hr tablet   Commonly known as: DITROPAN-XL   Take 10 mg by mouth every evening.      PREMARIN 0.3 MG tablet   Generic drug: estrogens (conjugated)   TAKE 1 TABLET DAILY      SEROQUEL XR 150 MG 24 hr tablet   Generic drug: QUEtiapine Fumarate   Take 150-450 mg by mouth 2 (two) times daily. Take 1 every morning and 3  At bedtime      thiamine 100 MG tablet   Commonly known as: VITAMIN B-1   Take 100 mg by mouth daily.      topiramate 100 MG tablet   Commonly known as: TOPAMAX   Take 100 mg by mouth 2 (two) times daily.      ULTIMATE PROBIOTIC FORMULA PO   Take 1 capsule by mouth daily.      UROCIT-K 10 10 MEQ (1080 MG) SR tablet   Generic drug: potassium citrate   Take 20 mEq by mouth 2 (two) times daily.      venlafaxine 150 MG 24 hr capsule   Commonly known as: EFFEXOR-XR   Take 150 mg by mouth every morning.      Vitamin D 2000 UNITS Caps   Take 1 capsule by mouth at  bedtime.           Follow-up Information    Follow up with Evvie Behrmann D, MD. Schedule an appointment as soon as possible for a visit in 1 week.   Contact information:   [facility] [address] [place]  [address] [phone]          Signed: Antony Contras 04/23/2011, 9:11 AM

## 2011-04-27 ENCOUNTER — Encounter (HOSPITAL_COMMUNITY): Payer: Self-pay | Admitting: Otolaryngology

## 2011-04-29 ENCOUNTER — Encounter: Payer: Self-pay | Admitting: Endocrinology

## 2011-04-29 ENCOUNTER — Ambulatory Visit (INDEPENDENT_AMBULATORY_CARE_PROVIDER_SITE_OTHER): Payer: Medicare Other | Admitting: Endocrinology

## 2011-04-29 DIAGNOSIS — E109 Type 1 diabetes mellitus without complications: Secondary | ICD-10-CM

## 2011-04-29 NOTE — Patient Instructions (Addendum)
please increase novolog 70/30, to 32 units with breakfast, and 17 with the evening meal. check your blood sugar 2 times a day.  vary the time of day when you check, between before the 3 meals, and at bedtime.  also check if you have symptoms of your blood sugar being too high or too low.  please keep a record of the readings and bring it to your next appointment here.  please call us sooner if you are having low blood sugar episodes. Please make a follow-up appointment in 1 month.   Continue the thyroid pill at the same dosage.

## 2011-04-29 NOTE — Progress Notes (Signed)
Subjective:    Patient ID: Brittany Acosta, female    DOB: May 17, 1950, 61 y.o.   MRN: [id number]  HPI Pt returns for f/u of insulin-requiring DM (2002).  She likes the bid insulin schedule better.  She had only 1 episode of hypoglycemia, and this was mild.  She does not recall what time of day it was.  she brings a record of her cbg's which i have reviewed today.  It varies from 150-220.  There is no trend throughout the day. She had thyroidectomy approx 1 week ago.  She takes synthroid 50/d.  Past Medical History  Diagnosis Date  . GOITER, MULTINODULAR   . HYPOTHYROIDISM   . HYPERCHOLESTEROLEMIA   . Morbid obesity   . BIPOLAR DISORDER UNSPECIFIED   . Chronic diastolic heart failure   . ALLERGIC RHINITIS   . APHTHOUS STOMATITIS   . TRANSAMINASES, SERUM, ELEVATED   . ASYMPTOMATIC POSTMENOPAUSAL STATUS   . FIBROMYALGIA   . PONV (postoperative nausea and vomiting)   . IDDM     Type  2 DM x 10 years  . Chronic kidney disease     kidney stones; sees urologist @ [facility]  . Fibromyalgia   . Depression     Bipolar disorder  . Arthritis     rheumatoid arthritis knees and shoulders  . GERD (gastroesophageal reflux disease)   . Shortness of breath     exertional  . History of wrist fracture     rt wrist    Past Surgical History  Procedure Date  . Cholecystectomy 1985  . Abdominal hysterectomy 1976  . Tubal ligation 1972  . Left ovary removed 1980  . Right nasal surgery 08/1988  . Right sinus removed 08/1989    tooth partial  . Percitania stone removed (l) kidney 1992  . Lithotripsy (l) kidney 1997  . Left knee arthroscopy      Partial medical menisectomy  . Left cystoscopy  1990  . Right knee surgery  08/2009  . Eye surgery 2012    Bil cataract Ext  . Breast surgery 2000    Breast bx- Left  . Cardiac catheterization 2001    sees Dr Peter [place]  . Thyroidectomy 04/22/2011    Procedure: THYROIDECTOMY;  Surgeon: Antony Contras, MD;  Location: [facility] OR;  Service: ENT;  Laterality:  N/A;  TOTAL THYROIDECOTMY    History   Social History  . Marital Status: Married    Spouse Name: N/A    Number of Children: 2  . Years of Education: N/A   Occupational History  . disability    Social History Main Topics  . Smoking status: Former Smoker -- 1.0 packs/day for 6 years    Types: Cigarettes    Quit date: 04/06/1983  . Smokeless tobacco: Never Used   Comment: Married, lives with spouse. Disable- 2 grown kids-6 g-kids  . Alcohol Use: No  . Drug Use: No  . Sexually Active: Yes    Birth Control/ Protection: Surgical   Other Topics Concern  . Not on file   Social History Narrative   Married, lives with spouse - 2 adult childrendiabled    Current Outpatient Prescriptions on File Prior to Visit  Medication Sig Dispense Refill  . ARIPiprazole (ABILIFY) 20 MG tablet Take 20 mg by mouth at bedtime.        Marland Kitchen aspirin 81 MG tablet Take 81 mg by mouth at bedtime.       Marland Kitchen atorvastatin (LIPITOR) 20 MG tablet Take  1 tablet (20 mg total) by mouth daily.  90 tablet  3  . Biotin 5000 MCG CAPS Take 1 capsule by mouth 3 (three) times daily.        . Calcium Carbonate-Vitamin D (CALCIUM 600 + D PO) Take 1 tablet by mouth 2 (two) times daily.        . Cholecalciferol (VITAMIN D) 2000 UNITS CAPS Take 1 capsule by mouth at bedtime.        . clonazePAM (KLONOPIN) 1 MG tablet Take 2 mg by mouth at bedtime.       . Coenzyme Q10 (CO Q 10) 60 MG CAPS Take 1 capsule by mouth 3 (three) times daily.        . furosemide (LASIX) 40 MG tablet Take 1 tablet (40 mg total) by mouth daily.  90 tablet  2  . glucose blood (ONE TOUCH ULTRA TEST) test strip 1 each by Other route 2 (two) times daily. Use as instructed to test blood sugar 2 times daily dx 250.01, and lancets 2/day  180 each  3  . hydrochlorothiazide 25 MG tablet Take 25 mg by mouth every morning.        Marland Kitchen HYDROcodone-acetaminophen (NORCO) 5-325 MG per tablet Take 1-2 tablets by mouth every 4 (four) hours as needed.  30 tablet  0  .  indomethacin (INDOCIN SR) 75 MG CR capsule Take 75 mg by mouth 4 (four) times daily as needed. For gout.      . insulin aspart protamine-insulin aspart (NOVOLOG 70/30) (70-30) 100 UNIT/ML injection Inject 15-30 Units into the skin 2 (two) times daily before a meal. 30 units before breakfast, and 15 units before the evening meal, and pen needles 2/day.       . Insulin Pen Needle (B-D UF III MINI PEN NEEDLES) 31G X 5 MM MISC 1 each by Other route 3 (three) times daily.  300 each  3  . Lactobacillus (ULTIMATE PROBIOTIC FORMULA PO) Take 1 capsule by mouth daily.        Marland Kitchen lamoTRIgine (LAMICTAL) 200 MG tablet Take 200 mg by mouth daily at 12 noon.        Marland Kitchen levothyroxine (SYNTHROID) 50 MCG tablet Take 1 tablet (50 mcg total) by mouth daily.  90 tablet  2  . Magnesium 100 MG TABS Take 1 tablet by mouth 3 (three) times daily.        . Manganese Gluconate 50 MG TABS Take 1 tablet by mouth daily.        . metFORMIN (GLUCOPHAGE) 1000 MG tablet TAKE 1 TABLET EVERY        MORNING AND 1 TABLET IN THEEVENING  180 tablet  2  . mirtazapine (REMERON) 30 MG tablet Take 30 mg by mouth at bedtime.        . Multiple Vitamin (MULTIVITAMIN) tablet Take 1 tablet by mouth daily.        . naproxen (NAPROSYN) 500 MG tablet Take 1 tablet (500 mg total) by mouth 2 (two) times daily with a meal.  180 tablet  1  . omeprazole (PRILOSEC) 20 MG capsule Take 1 capsule (20 mg total) by mouth 2 (two) times daily.  180 capsule  1  . oxybutynin (DITROPAN-XL) 10 MG 24 hr tablet Take 10 mg by mouth every evening.        . potassium citrate (UROCIT-K 10) 10 MEQ (1080 MG) SR tablet Take 20 mEq by mouth 2 (two) times daily.       Marland Kitchen PREMARIN  0.3 MG tablet TAKE 1 TABLET DAILY  90 tablet  1  . QUEtiapine Fumarate (SEROQUEL XR) 150 MG TB24 Take 150-450 mg by mouth 2 (two) times daily. Take 1 every morning and 3  At bedtime      . Thiamine HCl (VITAMIN B-1) 100 MG tablet Take 100 mg by mouth daily.        Marland Kitchen topiramate (TOPAMAX) 100 MG tablet Take  100 mg by mouth 2 (two) times daily.        Marland Kitchen venlafaxine (EFFEXOR-XR) 150 MG 24 hr capsule Take 150 mg by mouth every morning.          No Known Allergies  Family History  Problem Relation Age of Onset  . Hypertension Mother   . Diabetes Father   . Hypertension Father   . Hyperlipidemia Father   . Heart attack Other   . Coronary artery disease Other     BP 124/74  Pulse 100  Temp(Src) 97.8 F (36.6 C) (Oral)  Ht 5' 3.5" (1.613 m)  Wt 247 lb 9.6 oz (112.311 kg)  BMI 43.17 kg/m2  SpO2 96%   Review of Systems Denies loc and sob.      Objective:   Physical Exam VITAL SIGNS:  See vs page GENERAL: no distress Neck: a healing thyroidectomy scar is present.  i do not appreciate a nodule in the thyroid or elsewhere in the neck     Assessment & Plan:  S/P partial thyroidectomy, benign DM, needs increased rx

## 2011-05-04 ENCOUNTER — Telehealth: Payer: Self-pay

## 2011-05-04 NOTE — Telephone Encounter (Signed)
ok 

## 2011-05-04 NOTE — Telephone Encounter (Signed)
Pt called back, unable to reach, left message for pt to callback office.

## 2011-05-04 NOTE — Telephone Encounter (Signed)
Left message for pt to callback office.  

## 2011-05-04 NOTE — Telephone Encounter (Signed)
Pt called requesting MD advise if it would be safe for her to take Chromium 500 mg bid with her Rx medications. Pt says that the medication information states that it can affect blood glucose levels.

## 2011-05-04 NOTE — Telephone Encounter (Signed)
As SAE manages her DM, i will defer this question to him - thanks

## 2011-05-05 NOTE — Telephone Encounter (Signed)
Pt informed of MD's advisement. 

## 2011-05-18 ENCOUNTER — Other Ambulatory Visit: Payer: Self-pay | Admitting: Internal Medicine

## 2011-05-18 DIAGNOSIS — Z1231 Encounter for screening mammogram for malignant neoplasm of breast: Secondary | ICD-10-CM

## 2011-05-26 ENCOUNTER — Other Ambulatory Visit: Payer: Self-pay | Admitting: Orthopedic Surgery

## 2011-05-31 ENCOUNTER — Other Ambulatory Visit: Payer: Self-pay | Admitting: Endocrinology

## 2011-05-31 NOTE — Telephone Encounter (Signed)
Please decrease am insulin to 30 units

## 2011-05-31 NOTE — Telephone Encounter (Signed)
Pt left msg on vm wanting to inform md that her BS is running low during lunch in the 60's... 05/31/11@ 4:39pm/LMB

## 2011-06-01 NOTE — Telephone Encounter (Signed)
Pt informed of MD's advisement,

## 2011-06-03 ENCOUNTER — Other Ambulatory Visit: Payer: Self-pay | Admitting: Endocrinology

## 2011-06-04 ENCOUNTER — Encounter (HOSPITAL_COMMUNITY): Payer: Self-pay | Admitting: Respiratory Therapy

## 2011-06-07 ENCOUNTER — Other Ambulatory Visit (HOSPITAL_COMMUNITY): Payer: Medicare Other

## 2011-06-09 ENCOUNTER — Other Ambulatory Visit: Payer: Self-pay | Admitting: *Deleted

## 2011-06-09 MED ORDER — FUROSEMIDE 40 MG PO TABS: 40.0000 mg | ORAL_TABLET | Freq: Every day | ORAL | Status: DC

## 2011-06-09 NOTE — Telephone Encounter (Signed)
R'cd fax from [facility] for refill of Furosemide.

## 2011-06-11 ENCOUNTER — Other Ambulatory Visit (INDEPENDENT_AMBULATORY_CARE_PROVIDER_SITE_OTHER): Payer: Medicare Other

## 2011-06-11 ENCOUNTER — Ambulatory Visit (INDEPENDENT_AMBULATORY_CARE_PROVIDER_SITE_OTHER): Payer: Medicare Other | Admitting: Internal Medicine

## 2011-06-11 ENCOUNTER — Encounter: Payer: Self-pay | Admitting: Internal Medicine

## 2011-06-11 DIAGNOSIS — E7800 Pure hypercholesterolemia, unspecified: Secondary | ICD-10-CM

## 2011-06-11 DIAGNOSIS — E039 Hypothyroidism, unspecified: Secondary | ICD-10-CM

## 2011-06-11 DIAGNOSIS — E109 Type 1 diabetes mellitus without complications: Secondary | ICD-10-CM

## 2011-06-11 DIAGNOSIS — I10 Essential (primary) hypertension: Secondary | ICD-10-CM

## 2011-06-11 LAB — LIPID PANEL
Cholesterol: 128 mg/dL (ref 0–200)
HDL: 50 mg/dL (ref >39.00)
Total CHOL/HDL Ratio: 3
Triglycerides: 228 mg/dL — ABNORMAL HIGH (ref 0.0–149.0)
VLDL: 45.6 mg/dL — ABNORMAL HIGH (ref 0.0–40.0)

## 2011-06-11 LAB — HEMOGLOBIN A1C: Hgb A1c MFr Bld: 7.9 % — ABNORMAL HIGH (ref 4.6–6.5)

## 2011-06-11 LAB — LDL CHOLESTEROL, DIRECT: Direct LDL: 56.4 mg/dL

## 2011-06-11 LAB — TSH: TSH: 2.45 u[IU]/mL (ref 0.35–5.50)

## 2011-06-11 NOTE — Assessment & Plan Note (Signed)
On statin - check annually The current medical regimen appears effective;  continue present plan and medications.

## 2011-06-11 NOTE — Assessment & Plan Note (Signed)
BP Readings from Last 3 Encounters:  06/11/11 120/72  04/29/11 124/74  04/23/11 130/66   The current medical regimen is effective;  continue present plan and medications.

## 2011-06-11 NOTE — Assessment & Plan Note (Signed)
S/p partial thyroidectomy 04/2011 for goiter - follows with endo for same Check TSH now, adjust as needed The current medical regimen is effective;  continue present plan and medications. Lab Results  Component Value Date   TSH 1.11 12/14/2010

## 2011-06-11 NOTE — Progress Notes (Signed)
Subjective:    Patient ID: Brittany Acosta, female    DOB: [birth year], 61 y.o.   MRN: [id number]  HPI  here for follow up - reviewed chronic medical issues:  DM2 - follows with endo for same. reports compliance with ongoing medical treatment and no changes in medication dose or frequency. denies adverse side effects related to current therapy.  home cbg log reviewed - no hypoglycemia events - improved neuropathy - less feet tingling and pain on current meds- working with podiatry for same  HTN - reports compliance with ongoing medical treatment and no changes in medication dose or frequency. denies adverse side effects related to current therapy.   dyslipidemia- on statin, reports compliance with ongoing medical treatment and no changes in medication dose or frequency. denies adverse side effects related to current therapy.   hypothyroid - reviewed thyroidectomy January 2013 procedure for goiter -reports compliance with ongoing medical treatment and no changes in medication dose or frequency. denies adverse side effects related to current therapy.   Past Medical History  Diagnosis Date  . GOITER, MULTINODULAR   . HYPOTHYROIDISM   . HYPERCHOLESTEROLEMIA   . Morbid obesity   . BIPOLAR DISORDER UNSPECIFIED     depression  . Chronic diastolic heart failure   . ALLERGIC RHINITIS   . APHTHOUS STOMATITIS   . TRANSAMINASES, SERUM, ELEVATED   . ASYMPTOMATIC POSTMENOPAUSAL STATUS   . FIBROMYALGIA   . IDDM     Type  2 DM x 10 years  . Chronic kidney disease     kidney stones; sees urologist @ [facility]  . Arthritis     rheumatoid arthritis knees and shoulders  . GERD (gastroesophageal reflux disease)   . History of wrist fracture     rt wrist    Review of Systems  Constitutional: Negative for fever and unexpected weight change.  HENT: Negative for facial swelling and neck pain.   Respiratory: Negative for cough.   Cardiovascular: Negative for chest pain and leg swelling.  Neurological:  Negative for dizziness and headaches.      Objective:   Physical Exam  BP 120/72  Pulse 101  Temp(Src) 98.9 F (37.2 C) (Oral)  Ht 5\' 2"  (1.575 m)  Wt 247 lb 1.9 oz (112.093 kg)  BMI 45.20 kg/m2  SpO2 97%     Wt Readings from Last 3 Encounters:  06/11/11 247 lb 1.9 oz (112.093 kg)  04/29/11 247 lb 9.6 oz (112.311 kg)  04/22/11 245 lb (111.131 kg)    Constitutional: She is overweight. She appears well-developed and well-nourished. No distress.  Neck: thyroidectomy scar healing well, mild soft tissue swelling without fluid or mass - Normal range of motion. Neck supple. No JVD present. Cardiovascular: Normal rate, regular rhythm and normal heart sounds.  No murmur heard. no BLE edema Pulmonary/Chest: Effort normal and breath sounds normal. No respiratory distress. She has no wheezes.  Musculoskeletal: No joint effusions, B knee - boggy synovitis - tender to palpation over joint line; FROM and ligamentous function intact Neurological: She is alert and oriented to person, place, and time. No cranial nerve deficit. MAE well; Coordination and balance normal.   Lab Results  Component Value Date   WBC 11.7* 04/20/2011   HGB 10.2* 04/20/2011   HCT 32.6* 04/20/2011   PLT 428* 04/20/2011   CHOL 122 03/23/2010   TRIG 205.0* 03/23/2010   HDL 43.50 03/23/2010   LDLDIRECT 52.3 03/23/2010   ALT 26 04/20/2011   AST 25 04/20/2011   NA 141  04/20/2011   K 3.5 04/20/2011   CL 99 04/20/2011   CREATININE 0.94 04/20/2011   BUN 17 04/20/2011   CO2 32 04/20/2011   TSH 1.11 12/14/2010   HGBA1C 7.6* 12/14/2010   MICROALBUR 0.2 12/24/2008   Assessment & Plan:  See problem list. Medications and labs reviewed today.

## 2011-06-11 NOTE — Assessment & Plan Note (Signed)
Reports increasing peripheral neuropathy symptoms Follows with podiatry and endo for same -  on gabapentin since 9/12 in addition to elavil  -  Check a1c now - meds managed by endo The current medical regimen appears effective;  continue present plan and medications.  Lab Results  Component Value Date   HGBA1C 7.6* 12/14/2010

## 2011-06-14 ENCOUNTER — Ambulatory Visit (INDEPENDENT_AMBULATORY_CARE_PROVIDER_SITE_OTHER): Payer: Medicare Other | Admitting: Endocrinology

## 2011-06-14 ENCOUNTER — Ambulatory Visit: Payer: Medicare Other | Admitting: Internal Medicine

## 2011-06-14 ENCOUNTER — Encounter: Payer: Self-pay | Admitting: Endocrinology

## 2011-06-14 DIAGNOSIS — E109 Type 1 diabetes mellitus without complications: Secondary | ICD-10-CM

## 2011-06-14 DIAGNOSIS — E042 Nontoxic multinodular goiter: Secondary | ICD-10-CM

## 2011-06-14 NOTE — Patient Instructions (Addendum)
please continue the same insulin. check your blood sugar 2 times a day.  vary the time of day when you check, between before the 3 meals, and at bedtime.  also check if you have symptoms of your blood sugar being too high or too low.  please keep a record of the readings and bring it to your next appointment here.  please call us sooner if you are having low blood sugar episodes.  If it goes below 70, please write down why it may have happened). Please make a follow-up appointment in 3 months.

## 2011-06-14 NOTE — Progress Notes (Signed)
Subjective:    Patient ID: Brittany Acosta, female    DOB: 10-Oct-1950, 61 y.o.   MRN: [id number]  HPI Pt returns for f/u of insulin-requiring DM (2002).  she brings a record of her cbg's which i have reviewed today.  It varies from 63 (once, in the afternoon), to 200's (after evening meal).  She does not know why it was low in the afternoon (such as lunch being smaller than expected, or delayed).  Since am insulin was reduced to 30 units, she ha had no further hypoglycemia. She had left thyroid lobectomy 6 weeks ago for mass effect.  She feels well on synthroid.   Past Medical History  Diagnosis Date  . GOITER, MULTINODULAR   . HYPOTHYROIDISM   . HYPERCHOLESTEROLEMIA   . Morbid obesity   . BIPOLAR DISORDER UNSPECIFIED     depression  . Chronic diastolic heart failure   . ALLERGIC RHINITIS   . APHTHOUS STOMATITIS   . TRANSAMINASES, SERUM, ELEVATED   . ASYMPTOMATIC POSTMENOPAUSAL STATUS   . FIBROMYALGIA   . IDDM     Type  2 DM x 10 years  . Chronic kidney disease     kidney stones; sees urologist @ [facility]  . Arthritis     rheumatoid arthritis knees and shoulders  . GERD (gastroesophageal reflux disease)   . History of wrist fracture     rt wrist    Past Surgical History  Procedure Date  . Cholecystectomy 1985  . Abdominal hysterectomy 1976  . Tubal ligation 1972  . Left ovary removed 1980  . Right nasal surgery 08/1988  . Right sinus removed 08/1989    tooth partial  . Percitania stone removed (l) kidney 1992  . Lithotripsy (l) kidney 1997  . Left knee arthroscopy      Partial medical menisectomy  . Left cystoscopy  1990  . Right knee surgery  08/2009  . Breast surgery 2000    Breast bx- Left  . Cardiac catheterization 2001    sees Dr Peter [place]  . Thyroidectomy 04/22/2011    Procedure: THYROIDECTOMY;  Surgeon: Antony Contras, MD;  Location: [facility] OR;  Service: ENT;  Laterality: N/A;  TOTAL THYROIDECOTMY  . Cataract extraction, bilateral 02/2011    epps    History    Social History  . Marital Status: Married    Spouse Name: N/A    Number of Children: 2  . Years of Education: N/A   Occupational History  . disability    Social History Main Topics  . Smoking status: Former Smoker -- 1.0 packs/day for 6 years    Types: Cigarettes    Quit date: 04/06/1983  . Smokeless tobacco: Never Used   Comment: Married, lives with spouse. Disable- 2 grown kids-6 g-kids  . Alcohol Use: No  . Drug Use: No  . Sexually Active: Yes    Birth Control/ Protection: Surgical   Other Topics Concern  . Not on file   Social History Narrative   Married, lives with spouse - 2 adult childrendiabled    Current Outpatient Prescriptions on File Prior to Visit  Medication Sig Dispense Refill  . Alcohol Swabs ([facility] ALCOHOL PREP SWABS) 70 % PADS USE AS DIRECTED  200 each  3  . Alpha-Lipoic Acid 300 MG TABS Take by mouth daily.      . ARIPiprazole (ABILIFY) 20 MG tablet Take 20 mg by mouth at bedtime.        Marland Kitchen aspirin 81 MG tablet Take 81  mg by mouth at bedtime.       Marland Kitchen atorvastatin (LIPITOR) 20 MG tablet Take 1 tablet (20 mg total) by mouth daily.  90 tablet  3  . Biotin 5000 MCG CAPS Take 1 capsule by mouth 3 (three) times daily.        . Calcium Carbonate-Vitamin D (CALCIUM 600 + D PO) Take 1 tablet by mouth 2 (two) times daily.        . Cholecalciferol (VITAMIN D) 2000 UNITS CAPS Take 1 capsule by mouth at bedtime.        . clonazePAM (KLONOPIN) 1 MG tablet Take 2 mg by mouth at bedtime.       . Coenzyme Q10 (CO Q 10) 60 MG CAPS Take 1 capsule by mouth 3 (three) times daily.        Marland Kitchen estrogens, conjugated, (PREMARIN) 0.3 MG tablet Take 0.3 mg by mouth daily.       . furosemide (LASIX) 40 MG tablet Take 40 mg by mouth at bedtime.      Marland Kitchen glucose blood (ONE TOUCH ULTRA TEST) test strip 1 each by Other route 2 (two) times daily. Use as instructed to test blood sugar 2 times daily dx 250.01, and lancets 2/day  180 each  3  . hydrochlorothiazide 25 MG tablet Take 25 mg by  mouth every morning.        . indomethacin (INDOCIN SR) 75 MG CR capsule Take 75 mg by mouth 4 (four) times daily as needed. For gout.      . insulin aspart protamine-insulin aspart (NOVOLOG 70/30) (70-30) 100 UNIT/ML injection Inject into the skin 2 (two) times daily before a meal. 30 units before breakfast, and 17 units before the evening meal, and pen needles 2/day.  10 mL    . Insulin Pen Needle (B-D UF III MINI PEN NEEDLES) 31G X 5 MM MISC 1 each by Other route 3 (three) times daily.  300 each  3  . Lactobacillus (ULTIMATE PROBIOTIC FORMULA PO) Take 1 capsule by mouth daily.        Marland Kitchen lamoTRIgine (LAMICTAL) 200 MG tablet Take 200 mg by mouth daily at 12 noon.        Marland Kitchen levothyroxine (SYNTHROID) 50 MCG tablet Take 1 tablet (50 mcg total) by mouth daily.  90 tablet  2  . Magnesium 100 MG TABS Take 100 mg by mouth 3 (three) times daily.       . Manganese Gluconate 50 MG TABS Take 50 mg by mouth daily.       . metFORMIN (GLUCOPHAGE) 1000 MG tablet Take 1,000 mg by mouth 2 (two) times daily with a meal.      . mirtazapine (REMERON) 30 MG tablet Take 30 mg by mouth at bedtime.        . Multiple Vitamin (MULTIVITAMIN) tablet Take 1 tablet by mouth daily.        . naproxen (NAPROSYN) 500 MG tablet Take 1 tablet (500 mg total) by mouth 2 (two) times daily with a meal.  180 tablet  1  . NON FORMULARY Hair Revitalizing formula take 2 tabs daily      . omeprazole (PRILOSEC) 20 MG capsule Take 1 capsule (20 mg total) by mouth 2 (two) times daily.  180 capsule  1  . oxybutynin (DITROPAN-XL) 10 MG 24 hr tablet Take 10 mg by mouth every evening.        . potassium citrate (UROCIT-K 10) 10 MEQ (1080 MG) SR tablet Take 20  mEq by mouth 2 (two) times daily.       . QUEtiapine Fumarate (SEROQUEL XR) 150 MG TB24 Take 150-450 mg by mouth 2 (two) times daily. Take 1 every morning and 3  At bedtime      . Thiamine HCl (VITAMIN B-1) 100 MG tablet Take 100 mg by mouth daily.        Marland Kitchen topiramate (TOPAMAX) 100 MG tablet  Take 100 mg by mouth 2 (two) times daily.        Marland Kitchen venlafaxine (EFFEXOR-XR) 150 MG 24 hr capsule Take 150 mg by mouth every morning.          No Known Allergies  Family History  Problem Relation Age of Onset  . Hypertension Mother   . Diabetes Father   . Hypertension Father   . Hyperlipidemia Father   . Heart attack Other   . Coronary artery disease Other     BP 130/72  Pulse 89  Temp(Src) 96.7 F (35.9 C) (Oral)  Ht 5\' 3"  (1.6 m)  Wt 247 lb (112.038 kg)  BMI 43.75 kg/m2  SpO2 96%  Review of Systems Denies loc.    Objective:   Physical Exam VITAL SIGNS:  See vs page GENERAL: no distress PSYCH: Alert and oriented x 3.  Does not appear anxious nor depressed.    Lab Results  Component Value Date   HGBA1C 7.9* 06/11/2011   Lab Results  Component Value Date   TSH 2.45 06/11/2011      Assessment & Plan:  DM.  this is the best control this pt should aim for, given this regimen, which does match insulin to her changing needs throughout the day Postsurgical hypothyroidism, well-replaced

## 2011-06-15 ENCOUNTER — Encounter (HOSPITAL_COMMUNITY): Payer: Self-pay

## 2011-06-15 ENCOUNTER — Encounter (HOSPITAL_COMMUNITY)
Admission: RE | Admit: 2011-06-15 | Discharge: 2011-06-15 | Disposition: A | Discharge status: Home / Self Care | Payer: Medicare Other | Source: Ambulatory Visit | Attending: Orthopedic Surgery | Admitting: Orthopedic Surgery

## 2011-06-15 LAB — BASIC METABOLIC PANEL
BUN: 15 mg/dL (ref 6–23)
CO2: 30 mEq/L (ref 19–32)
Calcium: 9.2 mg/dL (ref 8.4–10.5)
Chloride: 99 mEq/L (ref 96–112)
Creatinine, Ser: 0.88 mg/dL (ref 0.50–1.10)
GFR calc Af Amer: 81 mL/min — ABNORMAL LOW (ref >90)
GFR calc non Af Amer: 70 mL/min — ABNORMAL LOW (ref >90)
Glucose, Bld: 105 mg/dL — ABNORMAL HIGH (ref 70–99)
Potassium: 3.6 mEq/L (ref 3.5–5.1)
Sodium: 138 mEq/L (ref 135–145)

## 2011-06-15 LAB — CBC
HCT: 31.2 % — ABNORMAL LOW (ref 36.0–46.0)
Hemoglobin: 9.5 g/dL — ABNORMAL LOW (ref 12.0–15.0)
MCH: 21.5 pg — ABNORMAL LOW (ref 26.0–34.0)
MCHC: 30.4 g/dL (ref 30.0–36.0)
MCV: 70.6 fL — ABNORMAL LOW (ref 78.0–100.0)
Platelets: 483 10*3/uL — ABNORMAL HIGH (ref 150–400)
RBC: 4.42 MIL/uL (ref 3.87–5.11)
RDW: 16.6 % — ABNORMAL HIGH (ref 11.5–15.5)
WBC: 10.9 10*3/uL — ABNORMAL HIGH (ref 4.0–10.5)

## 2011-06-15 LAB — TYPE AND SCREEN
ABO/RH(D): O POS
Antibody Screen: NEGATIVE

## 2011-06-15 LAB — DIFFERENTIAL
Basophils Absolute: 0 10*3/uL (ref 0.0–0.1)
Basophils Relative: 0 % (ref 0–1)
Eosinophils Absolute: 1 10*3/uL — ABNORMAL HIGH (ref 0.0–0.7)
Eosinophils Relative: 9 % — ABNORMAL HIGH (ref 0–5)
Lymphocytes Relative: 35 % (ref 12–46)
Lymphs Abs: 3.8 10*3/uL (ref 0.7–4.0)
Monocytes Absolute: 0.7 10*3/uL (ref 0.1–1.0)
Monocytes Relative: 6 % (ref 3–12)
Neutro Abs: 5.3 10*3/uL (ref 1.7–7.7)
Neutrophils Relative %: 49 % (ref 43–77)

## 2011-06-15 LAB — PROTIME-INR
INR: 0.96 (ref 0.00–1.49)
Prothrombin Time: 13 seconds (ref 11.6–15.2)

## 2011-06-15 LAB — URINALYSIS, ROUTINE W REFLEX MICROSCOPIC
Bilirubin Urine: NEGATIVE
Glucose, UA: NEGATIVE mg/dL
Hgb urine dipstick: NEGATIVE
Ketones, ur: NEGATIVE mg/dL
Leukocytes, UA: NEGATIVE
Nitrite: NEGATIVE
Protein, ur: NEGATIVE mg/dL
Specific Gravity, Urine: 1.01 (ref 1.005–1.030)
Urobilinogen, UA: 0.2 mg/dL (ref 0.0–1.0)
pH: 7.5 (ref 5.0–8.0)

## 2011-06-15 LAB — APTT: aPTT: 29 seconds (ref 24–37)

## 2011-06-15 LAB — ABO/RH: ABO/RH(D): O POS

## 2011-06-15 LAB — SURGICAL PCR SCREEN
MRSA, PCR: NEGATIVE
Staphylococcus aureus: NEGATIVE

## 2011-06-15 NOTE — H&P (Signed)
  Subjective: The patient returns in followup for bilateral knee arthritis.  At this point her left knee is causing significant pain that interferes with her sleep at night and her with her ability to ambulate throughout the day.  She completed the Supartz Visco supplementation series on 02/02/11.  She initially got very good pain relief from the injections, but at this point all of her pain has returned.  She localizes her pain to the medial side.  X-rays taken in December demonstrated significant arthritis in the medial compartment that was approaching bare bone.  She reports that her pain has worsened considerably over the last month with no new injury.  PAST MEDICAL HX: high cholesterol, HTN, Diabetes, hypothyroidism, Bipolar disorder, PTSD.  Past surgical Hx: Tubal ligation, hysterectomy, Cholecystectomy, Lithotripsy, Left knee scope  Family Hx: Non-contributory  Social Hx: Non-smoker, denies EtOH.  Allergies: NKDA  ROS: Patient denies dizziness, nausea, fever, chills, vomiting, shortness of breath, chest pain, loss of appetite, or rash.    PHYSICAL EXAM: Well-developed, well-nourished.  Awake, alert, and oriented x3.  Extraocular motion is intact.  No use of accessory respiratory muscles for breathing. Cardiovascular exam reveals a regular rhythm.  Skin is intact without cuts, scrapes, or abrasions. 5'3" tall, 247 lbs.  BMI 44. Examination of the left knee demonstrates no palpable effusion.  She is tender to palpation along the medial joint line.  Range of motion is 0-120.  She is neurovascularly intact.  X-rays: 4 views of the left knee taken today demonstrate mild progression of her arthritis in the medial compartment.  She now has end-stage arthritis.  Asses: End-stage arthritis of the left knee  Plan: We have discussed the options with Ms. Christofferson today.  One option would be a cortisone injection until it has been 6 months since her last Supartz injection and we can repeat the series.   Her other option is total knee replacement, which was discussed with her in detail today.  We have given her prescriptions for tramadol and Vicodin to take for pain.  She has decided to proceed with surgery.  All risks and benefits of surgery were discussed with the patient.

## 2011-06-15 NOTE — Pre-Procedure Instructions (Signed)
20 GENEVIVE PRINTUP  06/15/2011   Your procedure is scheduled on:  06/18/2011@1 :35PM  Report to [facility] at 11:30 AM.  Call this number if you have problems the morning of surgery: [phone]   Remember:   Do not eat food:After Midnight.  May have clear liquids: up to 4 Hours before arrival( nothing after 7:30AM).  Clear liquids include soda, tea, black coffee, apple or grape juice, broth.  Take these medicines the morning of surgery with A SIP OF WATER: Prilosec, Synthroid, Klonopin, Effexor, Ditropan, Premarin                   Do not wear jewelry, make-up or nail polish.  Do not wear lotions, powders, or perfumes. You may wear deodorant.  Do not shave 48 hours prior to surgery.  Do not bring valuables to the hospital.  Contacts, dentures or bridgework may not be worn into surgery.  Leave suitcase in the car. After surgery it may be brought to your room.  For patients admitted to the hospital, checkout time is 11:00 AM the day of discharge.   Patients discharged the day of surgery will not be allowed to drive home.  Name and phone number of your driver:   Special Instructions: CHG Shower Use Special Wash: 1/2 bottle night before surgery and 1/2 bottle morning of surgery.   Please read over the following fact sheets that you were given: Pain Booklet, Coughing and Deep Breathing, Blood Transfusion Information and Surgical Site Infection Prevention

## 2011-06-17 MED ORDER — CEFAZOLIN SODIUM-DEXTROSE 2-3 GM-% IV SOLR
2.0000 g | INTRAVENOUS | Status: DC
Start: 2011-06-18 — End: 2011-06-18
  Filled 2011-06-17: qty 50

## 2011-06-17 MED ORDER — DEXTROSE-NACL 5-0.45 % IV SOLN: INTRAVENOUS | Status: DC

## 2011-06-17 MED ORDER — CHLORHEXIDINE GLUCONATE 4 % EX LIQD
60.0000 mL | Freq: Once | CUTANEOUS | Status: DC
  Filled 2011-06-17: qty 60

## 2011-06-18 ENCOUNTER — Inpatient Hospital Stay (HOSPITAL_COMMUNITY)
Admission: RE | Admit: 2011-06-18 | Discharge: 2011-06-21 | DRG: 470 | Disposition: A | Discharge status: Skilled Nursing Facility | Payer: Medicare Other | Source: Ambulatory Visit | Attending: Orthopedic Surgery | Admitting: Orthopedic Surgery

## 2011-06-18 ENCOUNTER — Encounter (HOSPITAL_COMMUNITY): Payer: Self-pay | Admitting: *Deleted

## 2011-06-18 ENCOUNTER — Encounter (HOSPITAL_COMMUNITY): Payer: Self-pay | Admitting: Vascular Surgery

## 2011-06-18 ENCOUNTER — Ambulatory Visit (HOSPITAL_COMMUNITY): Payer: Medicare Other | Admitting: Vascular Surgery

## 2011-06-18 ENCOUNTER — Encounter (HOSPITAL_COMMUNITY)
Admission: RE | Disposition: A | Discharge status: Skilled Nursing Facility | Payer: Self-pay | Source: Ambulatory Visit | Attending: Orthopedic Surgery

## 2011-06-18 DIAGNOSIS — Z90710 Acquired absence of both cervix and uterus: Secondary | ICD-10-CM

## 2011-06-18 DIAGNOSIS — K219 Gastro-esophageal reflux disease without esophagitis: Secondary | ICD-10-CM | POA: Diagnosis present

## 2011-06-18 DIAGNOSIS — F4310 Post-traumatic stress disorder, unspecified: Secondary | ICD-10-CM | POA: Diagnosis present

## 2011-06-18 DIAGNOSIS — G4730 Sleep apnea, unspecified: Secondary | ICD-10-CM | POA: Diagnosis present

## 2011-06-18 DIAGNOSIS — M069 Rheumatoid arthritis, unspecified: Secondary | ICD-10-CM | POA: Diagnosis present

## 2011-06-18 DIAGNOSIS — E7800 Pure hypercholesterolemia, unspecified: Secondary | ICD-10-CM | POA: Diagnosis present

## 2011-06-18 DIAGNOSIS — F319 Bipolar disorder, unspecified: Secondary | ICD-10-CM | POA: Diagnosis present

## 2011-06-18 DIAGNOSIS — I10 Essential (primary) hypertension: Secondary | ICD-10-CM | POA: Diagnosis present

## 2011-06-18 DIAGNOSIS — I5032 Chronic diastolic (congestive) heart failure: Secondary | ICD-10-CM | POA: Diagnosis present

## 2011-06-18 DIAGNOSIS — Z794 Long term (current) use of insulin: Secondary | ICD-10-CM

## 2011-06-18 DIAGNOSIS — M1712 Unilateral primary osteoarthritis, left knee: Secondary | ICD-10-CM | POA: Diagnosis present

## 2011-06-18 DIAGNOSIS — Z780 Asymptomatic menopausal state: Secondary | ICD-10-CM

## 2011-06-18 DIAGNOSIS — E119 Type 2 diabetes mellitus without complications: Secondary | ICD-10-CM | POA: Diagnosis present

## 2011-06-18 DIAGNOSIS — E039 Hypothyroidism, unspecified: Secondary | ICD-10-CM | POA: Diagnosis present

## 2011-06-18 DIAGNOSIS — I509 Heart failure, unspecified: Secondary | ICD-10-CM | POA: Diagnosis present

## 2011-06-18 DIAGNOSIS — Z01812 Encounter for preprocedural laboratory examination: Secondary | ICD-10-CM

## 2011-06-18 DIAGNOSIS — M1710 Unilateral primary osteoarthritis, unspecified knee: Principal | ICD-10-CM | POA: Diagnosis present

## 2011-06-18 DIAGNOSIS — Z6841 Body Mass Index (BMI) 40.0 and over, adult: Secondary | ICD-10-CM

## 2011-06-18 DIAGNOSIS — IMO0001 Reserved for inherently not codable concepts without codable children: Secondary | ICD-10-CM | POA: Diagnosis present

## 2011-06-18 HISTORY — PX: TOTAL KNEE ARTHROPLASTY: SHX125

## 2011-06-18 LAB — GLUCOSE, CAPILLARY
Glucose-Capillary: 139 mg/dL — ABNORMAL HIGH (ref 70–99)
Glucose-Capillary: 141 mg/dL — ABNORMAL HIGH (ref 70–99)
Glucose-Capillary: 172 mg/dL — ABNORMAL HIGH (ref 70–99)
Glucose-Capillary: 219 mg/dL — ABNORMAL HIGH (ref 70–99)
Glucose-Capillary: 166 mg/dL — ABNORMAL HIGH (ref 70–99)

## 2011-06-18 SURGERY — ARTHROPLASTY, KNEE, TOTAL
Anesthesia: General | Site: Knee | Laterality: Left | Wound class: Clean

## 2011-06-18 MED ORDER — ROCURONIUM BROMIDE 100 MG/10ML IV SOLN
INTRAVENOUS | Status: DC | PRN
  Administered 2011-06-18: 50 mg via INTRAVENOUS

## 2011-06-18 MED ORDER — MENTHOL 3 MG MT LOZG: 1.0000 | LOZENGE | OROMUCOSAL | Status: DC | PRN

## 2011-06-18 MED ORDER — HYDROCODONE-ACETAMINOPHEN 5-325 MG PO TABS: 1.0000 | ORAL_TABLET | ORAL | Status: DC | PRN

## 2011-06-18 MED ORDER — TOPIRAMATE 100 MG PO TABS
100.0000 mg | ORAL_TABLET | Freq: Two times a day (BID) | ORAL | Status: DC
  Administered 2011-06-18 – 2011-06-21 (×6): 100 mg via ORAL
  Filled 2011-06-18 (×7): qty 1

## 2011-06-18 MED ORDER — SCOPOLAMINE 1 MG/3DAYS TD PT72
1.0000 | MEDICATED_PATCH | Freq: Once | TRANSDERMAL | Status: DC
  Filled 2011-06-18: qty 1

## 2011-06-18 MED ORDER — HYDROMORPHONE HCL PF 1 MG/ML IJ SOLN
INTRAMUSCULAR | Status: AC
  Administered 2011-06-18: 1 mg via INTRAVENOUS
  Filled 2011-06-18: qty 1

## 2011-06-18 MED ORDER — MIDAZOLAM HCL 5 MG/5ML IJ SOLN
INTRAMUSCULAR | Status: DC | PRN
  Administered 2011-06-18 (×2): 1 mg via INTRAVENOUS

## 2011-06-18 MED ORDER — ONDANSETRON HCL 4 MG/2ML IJ SOLN
INTRAMUSCULAR | Status: DC | PRN
  Administered 2011-06-18: 4 mg via INTRAVENOUS

## 2011-06-18 MED ORDER — MORPHINE SULFATE 2 MG/ML IJ SOLN: 0.0500 mg/kg | INTRAMUSCULAR | Status: DC | PRN

## 2011-06-18 MED ORDER — INSULIN ASPART PROT & ASPART (70-30 MIX) 100 UNIT/ML ~~LOC~~ SUSP
17.0000 [IU] | Freq: Every day | SUBCUTANEOUS | Status: DC
  Filled 2011-06-18: qty 3

## 2011-06-18 MED ORDER — MAGNESIUM 100 MG PO TABS: 100.0000 mg | ORAL_TABLET | Freq: Three times a day (TID) | ORAL | Status: DC

## 2011-06-18 MED ORDER — ESTROGENS CONJUGATED 0.3 MG PO TABS
0.3000 mg | ORAL_TABLET | Freq: Every day | ORAL | Status: DC
  Administered 2011-06-19 – 2011-06-21 (×3): 0.3 mg via ORAL
  Filled 2011-06-18 (×3): qty 1

## 2011-06-18 MED ORDER — KCL IN DEXTROSE-NACL 20-5-0.45 MEQ/L-%-% IV SOLN
INTRAVENOUS | Status: DC
  Administered 2011-06-18 – 2011-06-19 (×3): via INTRAVENOUS
  Filled 2011-06-18 (×11): qty 1000

## 2011-06-18 MED ORDER — FLEET ENEMA 7-19 GM/118ML RE ENEM: 1.0000 | ENEMA | Freq: Once | RECTAL | Status: AC | PRN

## 2011-06-18 MED ORDER — PHENOL 1.4 % MT LIQD: 1.0000 | OROMUCOSAL | Status: DC | PRN

## 2011-06-18 MED ORDER — WARFARIN SODIUM 7.5 MG PO TABS
7.5000 mg | ORAL_TABLET | Freq: Once | ORAL | Status: AC
  Administered 2011-06-18: 7.5 mg via ORAL
  Filled 2011-06-18: qty 1

## 2011-06-18 MED ORDER — POTASSIUM CHLORIDE CRYS ER 20 MEQ PO TBCR
20.0000 meq | EXTENDED_RELEASE_TABLET | Freq: Two times a day (BID) | ORAL | Status: DC
  Administered 2011-06-18 – 2011-06-19 (×2): 20 meq via ORAL
  Filled 2011-06-18 (×3): qty 1

## 2011-06-18 MED ORDER — ALUM & MAG HYDROXIDE-SIMETH 200-200-20 MG/5ML PO SUSP: 30.0000 mL | ORAL | Status: DC | PRN

## 2011-06-18 MED ORDER — CEFAZOLIN SODIUM 1-5 GM-% IV SOLN
INTRAVENOUS | Status: DC | PRN
  Administered 2011-06-18: 2 g via INTRAVENOUS

## 2011-06-18 MED ORDER — QUETIAPINE FUMARATE ER 50 MG PO TB24
450.0000 mg | ORAL_TABLET | Freq: Every day | ORAL | Status: DC
  Administered 2011-06-18 – 2011-06-20 (×3): 450 mg via ORAL
  Filled 2011-06-18 (×4): qty 1

## 2011-06-18 MED ORDER — PROPOFOL 10 MG/ML IV EMUL
INTRAVENOUS | Status: DC | PRN
  Administered 2011-06-18: 170 mg via INTRAVENOUS

## 2011-06-18 MED ORDER — FENTANYL CITRATE 0.05 MG/ML IJ SOLN
INTRAMUSCULAR | Status: DC | PRN
  Administered 2011-06-18 (×2): 50 ug via INTRAVENOUS
  Administered 2011-06-18: 100 ug via INTRAVENOUS
  Administered 2011-06-18: 50 ug via INTRAVENOUS

## 2011-06-18 MED ORDER — LACTATED RINGERS IV SOLN
INTRAVENOUS | Status: DC | PRN
  Administered 2011-06-18: 13:00:00 via INTRAVENOUS

## 2011-06-18 MED ORDER — WARFARIN VIDEO: Freq: Once | Status: DC

## 2011-06-18 MED ORDER — INSULIN ASPART PROT & ASPART (70-30 MIX) 100 UNIT/ML ~~LOC~~ SUSP
30.0000 [IU] | Freq: Every day | SUBCUTANEOUS | Status: DC
  Administered 2011-06-19 – 2011-06-21 (×3): 30 [IU] via SUBCUTANEOUS
  Filled 2011-06-18: qty 10
  Filled 2011-06-18: qty 3

## 2011-06-18 MED ORDER — ARIPIPRAZOLE 10 MG PO TABS
20.0000 mg | ORAL_TABLET | Freq: Every day | ORAL | Status: DC
  Administered 2011-06-18 – 2011-06-20 (×3): 20 mg via ORAL
  Filled 2011-06-18 (×4): qty 2

## 2011-06-18 MED ORDER — HYDROCHLOROTHIAZIDE 25 MG PO TABS
25.0000 mg | ORAL_TABLET | Freq: Every day | ORAL | Status: DC
  Administered 2011-06-18 – 2011-06-21 (×4): 25 mg via ORAL
  Filled 2011-06-18 (×4): qty 1

## 2011-06-18 MED ORDER — HYDROMORPHONE HCL PF 1 MG/ML IJ SOLN
0.5000 mg | INTRAMUSCULAR | Status: DC | PRN
  Administered 2011-06-18 – 2011-06-19 (×3): 1 mg via INTRAVENOUS
  Filled 2011-06-18 (×2): qty 1

## 2011-06-18 MED ORDER — FUROSEMIDE 40 MG PO TABS
40.0000 mg | ORAL_TABLET | Freq: Every day | ORAL | Status: DC
  Administered 2011-06-18 – 2011-06-20 (×3): 40 mg via ORAL
  Filled 2011-06-18 (×4): qty 1

## 2011-06-18 MED ORDER — MAGNESIUM HYDROXIDE 400 MG/5ML PO SUSP: 30.0000 mL | Freq: Every day | ORAL | Status: DC | PRN

## 2011-06-18 MED ORDER — SODIUM CHLORIDE 0.9 % IR SOLN
Status: DC | PRN
  Administered 2011-06-18: 1000 mL
  Administered 2011-06-18: 3000 mL

## 2011-06-18 MED ORDER — METHOCARBAMOL 500 MG PO TABS
500.0000 mg | ORAL_TABLET | Freq: Four times a day (QID) | ORAL | Status: DC | PRN
  Administered 2011-06-18 – 2011-06-20 (×4): 500 mg via ORAL
  Filled 2011-06-18 (×4): qty 1

## 2011-06-18 MED ORDER — FENTANYL CITRATE 0.05 MG/ML IJ SOLN
INTRAMUSCULAR | Status: AC
  Filled 2011-06-18: qty 2

## 2011-06-18 MED ORDER — NEOSTIGMINE METHYLSULFATE 1 MG/ML IJ SOLN
INTRAMUSCULAR | Status: DC | PRN
  Administered 2011-06-18: 3 mg via INTRAVENOUS

## 2011-06-18 MED ORDER — ONDANSETRON HCL 4 MG/2ML IJ SOLN: 4.0000 mg | Freq: Four times a day (QID) | INTRAMUSCULAR | Status: DC | PRN

## 2011-06-18 MED ORDER — METOCLOPRAMIDE HCL 10 MG PO TABS: 5.0000 mg | ORAL_TABLET | Freq: Three times a day (TID) | ORAL | Status: DC | PRN

## 2011-06-18 MED ORDER — LEVOTHYROXINE SODIUM 50 MCG PO TABS
50.0000 ug | ORAL_TABLET | Freq: Every day | ORAL | Status: DC
  Administered 2011-06-19 – 2011-06-21 (×3): 50 ug via ORAL
  Filled 2011-06-18 (×4): qty 1

## 2011-06-18 MED ORDER — MIDAZOLAM HCL 2 MG/2ML IJ SOLN
INTRAMUSCULAR | Status: AC
  Filled 2011-06-18: qty 2

## 2011-06-18 MED ORDER — OXYCODONE-ACETAMINOPHEN 5-325 MG PO TABS
1.0000 | ORAL_TABLET | ORAL | Status: DC | PRN
  Administered 2011-06-18 – 2011-06-21 (×8): 2 via ORAL
  Filled 2011-06-18 (×8): qty 2

## 2011-06-18 MED ORDER — INSULIN ASPART PROT & ASPART (70-30 MIX) 100 UNIT/ML ~~LOC~~ SUSP
30.0000 [IU] | Freq: Every day | SUBCUTANEOUS | Status: DC
  Filled 2011-06-18: qty 3

## 2011-06-18 MED ORDER — LAMOTRIGINE 200 MG PO TABS
200.0000 mg | ORAL_TABLET | Freq: Every day | ORAL | Status: DC
  Administered 2011-06-18 – 2011-06-21 (×4): 200 mg via ORAL
  Filled 2011-06-18 (×4): qty 1

## 2011-06-18 MED ORDER — POTASSIUM CITRATE ER 10 MEQ (1080 MG) PO TBCR
20.0000 meq | EXTENDED_RELEASE_TABLET | Freq: Two times a day (BID) | ORAL | Status: DC
  Filled 2011-06-18: qty 2

## 2011-06-18 MED ORDER — KCL IN DEXTROSE-NACL 20-5-0.45 MEQ/L-%-% IV SOLN
INTRAVENOUS | Status: AC
  Filled 2011-06-18: qty 1000

## 2011-06-18 MED ORDER — VENLAFAXINE HCL ER 150 MG PO CP24
150.0000 mg | ORAL_CAPSULE | Freq: Every day | ORAL | Status: DC
  Administered 2011-06-19 – 2011-06-21 (×3): 150 mg via ORAL
  Filled 2011-06-18 (×3): qty 1

## 2011-06-18 MED ORDER — HYDROMORPHONE HCL PF 1 MG/ML IJ SOLN
0.2500 mg | INTRAMUSCULAR | Status: DC | PRN
  Administered 2011-06-18: 0.5 mg via INTRAVENOUS

## 2011-06-18 MED ORDER — QUETIAPINE FUMARATE ER 50 MG PO TB24
150.0000 mg | ORAL_TABLET | Freq: Every day | ORAL | Status: DC
  Administered 2011-06-19 – 2011-06-21 (×3): 150 mg via ORAL
  Filled 2011-06-18 (×3): qty 3

## 2011-06-18 MED ORDER — GLYCOPYRROLATE 0.2 MG/ML IJ SOLN
INTRAMUSCULAR | Status: DC | PRN
  Administered 2011-06-18: 0.4 mg via INTRAVENOUS

## 2011-06-18 MED ORDER — METHOCARBAMOL 100 MG/ML IJ SOLN
500.0000 mg | Freq: Four times a day (QID) | INTRAMUSCULAR | Status: DC | PRN
  Filled 2011-06-18 (×3): qty 5

## 2011-06-18 MED ORDER — CEFUROXIME SODIUM 1.5 G IJ SOLR: INTRAMUSCULAR | Status: DC | PRN

## 2011-06-18 MED ORDER — MIRTAZAPINE 30 MG PO TABS
30.0000 mg | ORAL_TABLET | Freq: Every day | ORAL | Status: DC
  Administered 2011-06-18 – 2011-06-20 (×3): 30 mg via ORAL
  Filled 2011-06-18 (×4): qty 1

## 2011-06-18 MED ORDER — QUETIAPINE FUMARATE ER 50 MG PO TB24: 150.0000 mg | ORAL_TABLET | Freq: Two times a day (BID) | ORAL | Status: DC

## 2011-06-18 MED ORDER — OXYBUTYNIN CHLORIDE ER 10 MG PO TB24
10.0000 mg | ORAL_TABLET | Freq: Every evening | ORAL | Status: DC
  Administered 2011-06-19 – 2011-06-20 (×2): 10 mg via ORAL
  Filled 2011-06-18 (×4): qty 1

## 2011-06-18 MED ORDER — SIMVASTATIN 20 MG PO TABS
20.0000 mg | ORAL_TABLET | Freq: Every day | ORAL | Status: DC
  Administered 2011-06-18 – 2011-06-20 (×3): 20 mg via ORAL
  Filled 2011-06-18 (×4): qty 1

## 2011-06-18 MED ORDER — ALPHA-LIPOIC ACID 300 MG PO TABS: 300.0000 mg | ORAL_TABLET | Freq: Every day | ORAL | Status: DC

## 2011-06-18 MED ORDER — METOCLOPRAMIDE HCL 5 MG/ML IJ SOLN: 5.0000 mg | Freq: Three times a day (TID) | INTRAMUSCULAR | Status: DC | PRN

## 2011-06-18 MED ORDER — COUMADIN BOOK
Freq: Once | Status: AC
  Administered 2011-06-18: 18:00:00
  Filled 2011-06-18: qty 1

## 2011-06-18 MED ORDER — PANTOPRAZOLE SODIUM 40 MG PO TBEC
40.0000 mg | DELAYED_RELEASE_TABLET | Freq: Every day | ORAL | Status: DC
  Administered 2011-06-19 – 2011-06-21 (×3): 40 mg via ORAL
  Filled 2011-06-18 (×2): qty 1

## 2011-06-18 MED ORDER — LACTATED RINGERS IV SOLN
INTRAVENOUS | Status: DC
  Administered 2011-06-18: 13:00:00 via INTRAVENOUS

## 2011-06-18 MED ORDER — ONDANSETRON HCL 4 MG PO TABS: 4.0000 mg | ORAL_TABLET | Freq: Four times a day (QID) | ORAL | Status: DC | PRN

## 2011-06-18 MED ORDER — ACETAMINOPHEN 650 MG RE SUPP: 650.0000 mg | Freq: Four times a day (QID) | RECTAL | Status: DC | PRN

## 2011-06-18 MED ORDER — CLONAZEPAM 1 MG PO TABS
2.0000 mg | ORAL_TABLET | Freq: Every day | ORAL | Status: DC
  Administered 2011-06-18 – 2011-06-20 (×3): 2 mg via ORAL
  Filled 2011-06-18 (×3): qty 2

## 2011-06-18 MED ORDER — ZOLPIDEM TARTRATE 5 MG PO TABS: 5.0000 mg | ORAL_TABLET | Freq: Every evening | ORAL | Status: DC | PRN

## 2011-06-18 MED ORDER — SODIUM CHLORIDE 0.9 % IV SOLN
INTRAVENOUS | Status: DC | PRN
  Administered 2011-06-18: 14:00:00 via INTRAVENOUS

## 2011-06-18 MED ORDER — DIPHENHYDRAMINE HCL 12.5 MG/5ML PO ELIX: 12.5000 mg | ORAL_SOLUTION | ORAL | Status: DC | PRN

## 2011-06-18 MED ORDER — METFORMIN HCL 500 MG PO TABS
1000.0000 mg | ORAL_TABLET | Freq: Two times a day (BID) | ORAL | Status: DC
  Administered 2011-06-18 – 2011-06-21 (×6): 1000 mg via ORAL
  Filled 2011-06-18 (×8): qty 2

## 2011-06-18 MED ORDER — SCOPOLAMINE 1 MG/3DAYS TD PT72
MEDICATED_PATCH | TRANSDERMAL | Status: DC | PRN
  Administered 2011-06-18: 1 via TRANSDERMAL

## 2011-06-18 MED ORDER — ACETAMINOPHEN 325 MG PO TABS: 650.0000 mg | ORAL_TABLET | Freq: Four times a day (QID) | ORAL | Status: DC | PRN

## 2011-06-18 MED ORDER — INSULIN ASPART 100 UNIT/ML ~~LOC~~ SOLN: 0.0000 [IU] | Freq: Three times a day (TID) | SUBCUTANEOUS | Status: DC

## 2011-06-18 MED ORDER — BISACODYL 10 MG RE SUPP: 10.0000 mg | Freq: Every day | RECTAL | Status: DC | PRN

## 2011-06-18 MED ORDER — INSULIN ASPART PROT & ASPART (70-30 MIX) 100 UNIT/ML ~~LOC~~ SUSP
17.0000 [IU] | Freq: Every day | SUBCUTANEOUS | Status: DC
  Administered 2011-06-18 – 2011-06-20 (×3): 17 [IU] via SUBCUTANEOUS
  Filled 2011-06-18: qty 3

## 2011-06-18 MED ORDER — WARFARIN - PHARMACIST DOSING INPATIENT: Freq: Every day | Status: DC

## 2011-06-18 SURGICAL SUPPLY — 53 items
BANDAGE ELASTIC 6 VELCRO ST LF (GAUZE/BANDAGES/DRESSINGS) ×2 IMPLANT
BANDAGE ESMARK 6X9 LF (GAUZE/BANDAGES/DRESSINGS) ×1 IMPLANT
BLADE SAG 18X100X1.27 (BLADE) ×2 IMPLANT
BLADE SAW SGTL 13X75X1.27 (BLADE) ×2 IMPLANT
BLADE SURG ROTATE 9660 (MISCELLANEOUS) IMPLANT
BNDG ELASTIC 6X10 VLCR STRL LF (GAUZE/BANDAGES/DRESSINGS) ×2 IMPLANT
BNDG ESMARK 6X9 LF (GAUZE/BANDAGES/DRESSINGS) ×2
BOWL SMART MIX CTS (DISPOSABLE) ×2 IMPLANT
CEMENT HV SMART SET (Cement) ×4 IMPLANT
CLOTH BEACON ORANGE TIMEOUT ST (SAFETY) ×2 IMPLANT
COVER BACK TABLE 24X17X13 BIG (DRAPES) IMPLANT
COVER SURGICAL LIGHT HANDLE (MISCELLANEOUS) ×2 IMPLANT
CUFF TOURNIQUET SINGLE 34IN LL (TOURNIQUET CUFF) IMPLANT
CUFF TOURNIQUET SINGLE 44IN (TOURNIQUET CUFF) IMPLANT
DRAPE EXTREMITY T 121X128X90 (DRAPE) ×2 IMPLANT
DRAPE U-SHAPE 47X51 STRL (DRAPES) ×2 IMPLANT
DURAPREP 26ML APPLICATOR (WOUND CARE) ×2 IMPLANT
ELECT REM PT RETURN 9FT ADLT (ELECTROSURGICAL) ×2
ELECTRODE REM PT RTRN 9FT ADLT (ELECTROSURGICAL) ×1 IMPLANT
EVACUATOR 1/8 PVC DRAIN (DRAIN) ×2 IMPLANT
GAUZE XEROFORM 1X8 LF (GAUZE/BANDAGES/DRESSINGS) ×2 IMPLANT
GLOVE BIO SURGEON STRL SZ7 (GLOVE) ×2 IMPLANT
GLOVE BIO SURGEON STRL SZ7.5 (GLOVE) ×2 IMPLANT
GLOVE BIOGEL PI IND STRL 7.0 (GLOVE) ×1 IMPLANT
GLOVE BIOGEL PI IND STRL 8 (GLOVE) ×1 IMPLANT
GLOVE BIOGEL PI INDICATOR 7.0 (GLOVE) ×1
GLOVE BIOGEL PI INDICATOR 8 (GLOVE) ×1
GOWN PREVENTION PLUS XLARGE (GOWN DISPOSABLE) ×2 IMPLANT
GOWN STRL NON-REIN LRG LVL3 (GOWN DISPOSABLE) ×4 IMPLANT
HANDPIECE INTERPULSE COAX TIP (DISPOSABLE) ×1
HOOD PEEL AWAY FACE SHEILD DIS (HOOD) ×6 IMPLANT
KIT BASIN OR (CUSTOM PROCEDURE TRAY) ×2 IMPLANT
KIT ROOM TURNOVER OR (KITS) ×2 IMPLANT
MANIFOLD NEPTUNE II (INSTRUMENTS) ×2 IMPLANT
NS IRRIG 1000ML POUR BTL (IV SOLUTION) ×2 IMPLANT
PACK TOTAL JOINT (CUSTOM PROCEDURE TRAY) ×2 IMPLANT
PAD ARMBOARD 7.5X6 YLW CONV (MISCELLANEOUS) ×4 IMPLANT
PADDING CAST COTTON 6X4 STRL (CAST SUPPLIES) ×2 IMPLANT
SET HNDPC FAN SPRY TIP SCT (DISPOSABLE) ×1 IMPLANT
SPONGE GAUZE 4X4 12PLY (GAUZE/BANDAGES/DRESSINGS) ×2 IMPLANT
STAPLER VISISTAT 35W (STAPLE) ×2 IMPLANT
SUCTION FRAZIER TIP 10 FR DISP (SUCTIONS) ×2 IMPLANT
SURGIFLO TRUKIT (HEMOSTASIS) IMPLANT
SUT VIC AB 0 CTX 36 (SUTURE) ×1
SUT VIC AB 0 CTX36XBRD ANTBCTR (SUTURE) ×1 IMPLANT
SUT VIC AB 1 CTX 36 (SUTURE) ×1
SUT VIC AB 1 CTX36XBRD ANBCTR (SUTURE) ×1 IMPLANT
SUT VIC AB 2-0 CT1 27 (SUTURE) ×1
SUT VIC AB 2-0 CT1 TAPERPNT 27 (SUTURE) ×1 IMPLANT
TOWEL OR 17X24 6PK STRL BLUE (TOWEL DISPOSABLE) ×2 IMPLANT
TOWEL OR 17X26 10 PK STRL BLUE (TOWEL DISPOSABLE) ×2 IMPLANT
TRAY FOLEY CATH 14FR (SET/KITS/TRAYS/PACK) IMPLANT
WATER STERILE IRR 1000ML POUR (IV SOLUTION) ×6 IMPLANT

## 2011-06-18 NOTE — Transfer of Care (Signed)
Immediate Anesthesia Transfer of Care Note  Patient: Brittany Acosta  Procedure(s) Performed: Procedure(s) (LRB): TOTAL KNEE ARTHROPLASTY (Left)  Patient Location: PACU  Anesthesia Type: General and Regional  Level of Consciousness: awake, alert , oriented and patient cooperative  Airway & Oxygen Therapy: Patient Spontanous Breathing and Patient connected to nasal cannula oxygen  Post-op Assessment: Report given to PACU RN, Post -op Vital signs reviewed and stable and Patient moving all extremities X 4  Post vital signs: Reviewed and stable  Complications: No apparent anesthesia complications

## 2011-06-18 NOTE — Progress Notes (Signed)
Orthopedic Tech Progress Note Patient Details:  Brittany Acosta 1951/03/31 [id number]  CPM Left Knee CPM Left Knee: On Left Knee Flexion (Degrees): 30  Left Knee Extension (Degrees): 0    [name] 06/18/2011, 4:48 PM

## 2011-06-18 NOTE — Progress Notes (Signed)
Patient reports moderate left knee pain  BP 112/73  Pulse 97  Temp(Src) 97.8 F (36.6 C) (Oral)  Resp 20  SpO2 98%  Dressing in place NVI  Hg 8.6  POD #1 after left TKA  - drain d/c'ed - will change dressing tomorrow - d/c PCA and foley today, start oral pain meds - follow hg

## 2011-06-18 NOTE — Interval H&P Note (Signed)
History and Physical Interval Note:  06/18/2011 1:18 PM  Brittany Acosta  has presented today for surgery, with the diagnosis of OSTEOARTHRITIS LEFT KNEE  The various methods of treatment have been discussed with the patient and family. After consideration of risks, benefits and other options for treatment, the patient has consented to  Procedure(s) (LRB): TOTAL KNEE ARTHROPLASTY (Left) as a surgical intervention .  The patients' history has been reviewed, patient examined, no change in status, stable for surgery.  I have reviewed the patients' chart and labs.  Questions were answered to the patient's satisfaction.     Nestor Lewandowsky

## 2011-06-18 NOTE — Op Note (Signed)
PATIENT ID:      Brittany Acosta  MRN:     [id number] DOB/AGE:    Jan 27, 1951 / 61 y.o.       OPERATIVE REPORT    DATE OF PROCEDURE:  06/18/2011       PREOPERATIVE DIAGNOSIS:   OSTEOARTHRITIS LEFT KNEE      Estimated Body mass index is 43.17 kg/(m^2) as calculated from the following:   Height as of 04/29/11: 5' 3.5"(1.613 m).   Weight as of 04/29/11: 247 lb 9.6 oz(112.311 kg).                                                        POSTOPERATIVE DIAGNOSIS:   OSTEOARTHRITIS LEFT KNEE                                                                      PROCEDURE:  Procedure(s): TOTAL KNEE ARTHROPLASTY Using Depuy Sigma RP implants #3L Femur, #3Tibia, 10mm sigma RP bearing, 35 Patella     SURGEON: Esmirna Ravan J    ASSISTANT:   Shirl Harris PA-C   (Present and scrubbed throughout the case, critical for assistance with exposure, retraction, instrumentation, and closure.)         ANESTHESIA: GA combined with regional for post-op pain   DRAINS: foley, 2 medium hemovac in knee   TOURNIQUET TIME: * Missing tourniquet times found for documented tourniquets in log:  [id number] *    COMPLICATIONS:  None     SPECIMENS: None   INDICATIONS FOR PROCEDURE: The patient has  OSTEOARTHRITIS LEFT KNEE, varus deformities, XR shows bone on bone arthritis. Patient has failed all conservative measures including anti-inflammatory medicines, narcotics, attempts at  exercise and weight loss, cortisone injections and viscosupplementation.  Risks and benefits of surgery have been discussed, questions answered.   DESCRIPTION OF PROCEDURE: The patient identified by armband, received  right femoral nerve block and IV antibiotics, in the holding area at [facility]. Patient taken to the operating room, appropriate anesthetic  monitors were attached General endotracheal anesthesia induced with  the patient in supine position, Foley catheter was inserted. Tourniquet  applied high to the operative thigh. Lateral post  and foot positioner  applied to the table, the lower extremity was then prepped and draped  in usual sterile fashion from the ankle to the tourniquet. Time-out procedure was performed. The limb was wrapped with an Esmarch bandage and the tourniquet inflated to 350 mmHg. We began the operation by making the anterior midline incision starting at  handbreadth above the patella going over the patella 1 cm medial to and  4 cm distal to the tibial tubercle. Small bleeders in the skin and the  subcutaneous tissue identified and cauterized. Transverse retinaculum was incised and reflected medially and a medial parapatellar arthrotomy was accomplished. the patella was everted and theprepatellar fat pad resected. The superficial medial collateral  ligament was then elevated from anterior to posterior along the proximal  flare of the tibia and anterior half of the menisci resected. The knee was hyperflexed exposing bone on bone arthritis. Peripheral  and notch osteophytes as well as the cruciate ligaments were then resected. We continued to  work our way around posteriorly along the proximal tibia, and externally  rotated the tibia subluxing it out from underneath the femur. A McHale  retractor was placed through the notch and a lateral Hohmann retractor  placed, and we then drilled through the proximal tibia in line with the  axis of the tibia followed by an intramedullary guide rod and 2-degree  posterior slope cutting guide. The tibial cutting guide was pinned into place  allowing resection of 3 mm of bone medially and about 8 mm of bone  laterally because of her varus deformity. Satisfied with the tibial resection, we then  entered the distal femur 2 mm anterior to the PCL origin with the  intramedullary guide rod and applied the distal femoral cutting guide  set at 11mm, with 5 degrees of valgus. This was pinned along the  epicondylar axis. At this point, the distal femoral cut was accomplished without  difficulty. We then sized for a #3L femoral component and pinned the guide in 3 degrees of external rotation.The chamfer cutting guide was pinned into place. The anterior, posterior, and chamfer cuts were accomplished without difficulty followed by  the Sigma RP box cutting guide and the box cut. We also removed posterior osteophytes from the posterior femoral condyles. At this  time, the knee was brought into full extension. We checked our  extension and flexion gaps and found them symmetric at 10mm.  The patella thickness measured at 23 mm. We felt a 35 patella would  fit. We set the cutting guide at 15 and removed the posterior 9.5-10 mm  of the patella sized for 35 button and drilled the lollipop. The knee  was then once again hyperflexed exposing the proximal tibia. We sized for a #3 tibial base plate, applied the smokestack and the conical reamer followed by the the Delta fin keel punch. We then hammered into place the Sigma RP trial femoral component, inserted a 10-mm trial bearing, trial patellar button, and took the knee through range of motion from 0-130 degrees. No thumb pressure was required for patellar  tracking. At this point, all trial components were removed, a double batch of DePuy HV cement with 1500 mg of Zinacef was mixed and applied to all bony metallic mating surfaces except for the posterior condyles of the femur itself. In order, we  hammered into place the tibial tray and removed excess cement, the femoral component and removed excess cement, a 10-mm Sigma RP bearing  was inserted, and the knee brought to full extension with compression.  The patellar button was clamped into place, and excess cement  removed. While the cement cured the wound was irrigated out with normal saline solution pulse lavage, and medium Hemovac drains were placed from an anterolateral  approach. Ligament stability and patellar tracking were checked and found to be excellent. The parapatellar arthrotomy  was closed with  running #1 Vicryl suture. The subcutaneous tissue with 0 and 2-0 undyed  Vicryl suture, and the skin with skin staples. A dressing of Xeroform,  4 x 4, dressing sponges, Webril, and Ace wrap applied. The patient  awakened, extubated, and taken to recovery room without difficulty.   Nestor Lewandowsky 06/18/2011, 3:18 PM

## 2011-06-18 NOTE — Anesthesia Preprocedure Evaluation (Addendum)
Anesthesia Evaluation  Patient identified by MRN, date of birth, ID band Patient awake    Reviewed: Allergy & Precautions, H&P , NPO status , Patient's Chart, lab work & pertinent test results, reviewed documented beta blocker date and time   History of Anesthesia Complications (+) PONV  Airway Mallampati: III TM Distance: >3 FB Neck ROM: Limited    Dental No notable dental hx. (+) Teeth Intact   Pulmonary sleep apnea ,  Suspected Undiagnosed OSA breath sounds clear to auscultation  Pulmonary exam normal       Cardiovascular hypertension, Pt. on medications +CHF Rhythm:Regular Rate:Normal  Hx/o Diastolic Heart Failure   Neuro/Psych PSYCHIATRIC DISORDERS Anxiety Depression Fibromyalgia  Neuromuscular disease    GI/Hepatic Neg liver ROS, GERD-  Medicated and Controlled,  Endo/Other  Diabetes mellitus-, Well Controlled, Type 2, Insulin DependentHypothyroidism Morbid obesityHx/o large multinodular goiter  Renal/GU negative Renal ROS  negative genitourinary   Musculoskeletal  (+) Fibromyalgia -  Abdominal Normal abdominal exam  (+) + obese,  Abdomen: soft.    Peds  (+) Congenital Heart Disease Hematology negative hematology ROS (+)   Anesthesia Other Findings   Reproductive/Obstetrics negative OB ROS                          Anesthesia Physical Anesthesia Plan  ASA: III  Anesthesia Plan: General   Post-op Pain Management:    Induction: Intravenous  Airway Management Planned: Oral ETT  Additional Equipment:   Intra-op Plan:   Post-operative Plan: Extubation in OR  Informed Consent: I have reviewed the patients History and Physical, chart, labs and discussed the procedure including the risks, benefits and alternatives for the proposed anesthesia with the patient or authorized representative who has indicated his/her understanding and acceptance.   Dental advisory given  Plan  Discussed with: CRNA, Anesthesiologist and Surgeon  Anesthesia Plan Comments: (Would have Glidescope in room for intubation. Discussed Risks, Benefits and alternatives of Femoral Nerve Block for post operative pain management.)      Anesthesia Quick Evaluation

## 2011-06-18 NOTE — Anesthesia Procedure Notes (Signed)
Anesthesia Regional Block:  Femoral nerve block  Pre-Anesthetic Checklist: ,, timeout performed, Correct Patient, Correct Site, Correct Laterality, Correct Procedure, Correct Position, site marked, Risks and benefits discussed,  Surgical consent,  Pre-op evaluation,  At surgeon's request and post-op pain management  Laterality: Left  Prep: chloraprep       Needles:  Injection technique: Single-shot  Needle Type: Stimulator Needle - 80      Needle Gauge: 22 and 22 G    Additional Needles:  Procedures: ultrasound guided and nerve stimulator Femoral nerve block  Nerve Stimulator or Paresthesia:  Response: 1 mA, 4 cm  Additional Responses:   Narrative:  Start time: 06/18/2011 1:00 PM End time: 06/18/2011 1:11 PM  Additional Notes: 30 cc 0.5% marcaine 1:200 Epi injected in 5  Cc increments.  Kipp Brood, MD

## 2011-06-18 NOTE — Preoperative (Signed)
Beta Blockers   Reason not to administer Beta Blockers:Hold beta blocker due to otherPt takes BB at night taken on 3/14

## 2011-06-18 NOTE — Anesthesia Postprocedure Evaluation (Signed)
  Anesthesia Post-op Note  Patient: Brittany Acosta  Procedure(s) Performed: Procedure(s) (LRB): TOTAL KNEE ARTHROPLASTY (Left)  Patient Location: PACU  Anesthesia Type: General  Level of Consciousness: awake  Airway and Oxygen Therapy: Patient Spontanous Breathing  Post-op Pain: mild  Post-op Assessment: Post-op Vital signs reviewed  Post-op Vital Signs: stable  Complications: No apparent anesthesia complications

## 2011-06-18 NOTE — Anesthesia Postprocedure Evaluation (Signed)
  Anesthesia Post-op Note  Patient: Brittany Acosta  Procedure(s) Performed: Procedure(s) (LRB): TOTAL KNEE ARTHROPLASTY (Left)  Patient Location: PACU  Anesthesia Type: General  Level of Consciousness: awake  Airway and Oxygen Therapy: Patient Spontanous Breathing  Post-op Pain: none  Post-op Assessment: Post-op Vital signs reviewed  Post-op Vital Signs: stable  Complications: No apparent anesthesia complications

## 2011-06-18 NOTE — Progress Notes (Signed)
Orthopedic Tech Progress Note Patient Details:  Brittany Acosta October 11, 1950 [id number]  Patient ID: Frutoso Schatz, female   DOB: 06-19-1950, 61 y.o.   MRN: [id number]   [name] 06/18/2011, 4:49 PM Trapeze bar

## 2011-06-18 NOTE — Progress Notes (Signed)
ANTICOAGULATION CONSULT NOTE - Initial Consult  Pharmacy Consult for Coumadin Indication: VTE prophylaxis  No Known Allergies  Patient Measurements:  Weight: 111.9 kg on 06/15/11  Vital Signs: Temp: 97 F (36.1 C) (03/15 1627) Temp src: Oral (03/15 1059) BP: 144/66 mmHg (03/15 1630) Pulse Rate: 96  (03/15 1630)  Labs: No results found for this basename: HGB:2,HCT:3,PLT:3,APTT:3,LABPROT:3,INR:3,HEPARINUNFRC:3,CREATININE:3,CKTOTAL:3,CKMB:3,TROPONINI:3 in the last 72 hours The CrCl is unknown because both a height and weight (above a minimum accepted value) are required for this calculation.  Medical History: Past Medical History  Diagnosis Date  . GOITER, MULTINODULAR   . HYPOTHYROIDISM   . HYPERCHOLESTEROLEMIA   . Morbid obesity   . BIPOLAR DISORDER UNSPECIFIED     depression  . Chronic diastolic heart failure   . ALLERGIC RHINITIS   . APHTHOUS STOMATITIS   . TRANSAMINASES, SERUM, ELEVATED   . ASYMPTOMATIC POSTMENOPAUSAL STATUS   . FIBROMYALGIA   . IDDM     Type  2 DM x 10 years  . Chronic kidney disease     kidney stones; sees urologist @ [facility]  . Arthritis     rheumatoid arthritis knees and shoulders  . GERD (gastroesophageal reflux disease)   . History of wrist fracture     rt wrist  . Complication of anesthesia   . PONV (postoperative nausea and vomiting)     Medications:  Prescriptions prior to admission  Medication Sig Dispense Refill  . Alcohol Swabs ([employer] ALCOHOL PREP SWABS) 70 % PADS USE AS DIRECTED  200 each  3  . Alpha-Lipoic Acid 300 MG TABS Take by mouth daily.      . ARIPiprazole (ABILIFY) 20 MG tablet Take 20 mg by mouth at bedtime.        Marland Kitchen aspirin 81 MG tablet Take 81 mg by mouth at bedtime.       Marland Kitchen atorvastatin (LIPITOR) 20 MG tablet Take 1 tablet (20 mg total) by mouth daily.  90 tablet  3  . Biotin 5000 MCG CAPS Take 1 capsule by mouth 3 (three) times daily.        . Calcium Carbonate-Vitamin D (CALCIUM 600 + D PO) Take 1 tablet by mouth 2  (two) times daily.        . Cholecalciferol (VITAMIN D) 2000 UNITS CAPS Take 1 capsule by mouth at bedtime.        . clonazePAM (KLONOPIN) 1 MG tablet Take 2 mg by mouth at bedtime.       . Coenzyme Q10 (CO Q 10) 60 MG CAPS Take 1 capsule by mouth 3 (three) times daily.        Marland Kitchen estrogens, conjugated, (PREMARIN) 0.3 MG tablet Take 0.3 mg by mouth daily.       . furosemide (LASIX) 40 MG tablet Take 40 mg by mouth at bedtime.      . hydrochlorothiazide 25 MG tablet Take 25 mg by mouth every morning.        . indomethacin (INDOCIN SR) 75 MG CR capsule Take 75 mg by mouth 4 (four) times daily as needed. For gout.      . insulin aspart protamine-insulin aspart (NOVOLOG 70/30) (70-30) 100 UNIT/ML injection Inject into the skin 2 (two) times daily before a meal. 30 units before breakfast, and 17 units before the evening meal, and pen needles 2/day.  10 mL    . Lactobacillus (ULTIMATE PROBIOTIC FORMULA PO) Take 1 capsule by mouth daily.        Marland Kitchen lamoTRIgine (LAMICTAL) 200 MG  tablet Take 200 mg by mouth daily at 12 noon.        Marland Kitchen levothyroxine (SYNTHROID) 50 MCG tablet Take 1 tablet (50 mcg total) by mouth daily.  90 tablet  2  . Magnesium 100 MG TABS Take 100 mg by mouth 3 (three) times daily.       . Manganese Gluconate 50 MG TABS Take 50 mg by mouth daily.       . metFORMIN (GLUCOPHAGE) 1000 MG tablet Take 1,000 mg by mouth 2 (two) times daily with a meal.      . mirtazapine (REMERON) 30 MG tablet Take 30 mg by mouth at bedtime.        . Multiple Vitamin (MULTIVITAMIN) tablet Take 1 tablet by mouth daily.        . naproxen (NAPROSYN) 500 MG tablet Take 1 tablet (500 mg total) by mouth 2 (two) times daily with a meal.  180 tablet  1  . NON FORMULARY Hair Revitalizing formula take 2 tabs daily      . omeprazole (PRILOSEC) 20 MG capsule Take 1 capsule (20 mg total) by mouth 2 (two) times daily.  180 capsule  1  . oxybutynin (DITROPAN-XL) 10 MG 24 hr tablet Take 10 mg by mouth every evening.        .  potassium citrate (UROCIT-K 10) 10 MEQ (1080 MG) SR tablet Take 20 mEq by mouth 2 (two) times daily.       . QUEtiapine Fumarate (SEROQUEL XR) 150 MG TB24 Take 150-450 mg by mouth 2 (two) times daily. Take 1 every morning and 3  At bedtime      . Thiamine HCl (VITAMIN B-1) 100 MG tablet Take 100 mg by mouth daily.        Marland Kitchen topiramate (TOPAMAX) 100 MG tablet Take 100 mg by mouth 2 (two) times daily.        Marland Kitchen venlafaxine (EFFEXOR-XR) 150 MG 24 hr capsule Take 150 mg by mouth every morning.        Marland Kitchen DISCONTD: glucose blood (ONE TOUCH ULTRA TEST) test strip 1 each by Other route 2 (two) times daily. Use as instructed to test blood sugar 2 times daily dx 250.01, and lancets 2/day  180 each  3  . DISCONTD: Insulin Pen Needle (B-D UF III MINI PEN NEEDLES) 31G X 5 MM MISC 1 each by Other route 3 (three) times daily.  300 each  3   Scheduled:    . Alpha-Lipoic Acid  300 mg Oral Daily  . ARIPiprazole  20 mg Oral QHS  . clonazePAM  2 mg Oral QHS  . dextrose 5 % and 0.45 % NaCl with KCl 20 mEq/L      . estrogens (conjugated)  0.3 mg Oral Daily  . furosemide  40 mg Oral QHS  . hydrochlorothiazide  25 mg Oral BH-q7a  . insulin aspart  0-20 Units Subcutaneous TID WC  . insulin aspart protamine-insulin aspart  30 Units Subcutaneous BID AC  . lamoTRIgine  200 mg Oral Q1200  . levothyroxine  50 mcg Oral Daily  . Magnesium  100 mg Oral TID  . metFORMIN  1,000 mg Oral BID WC  . mirtazapine  30 mg Oral QHS  . oxybutynin  10 mg Oral QPM  . pantoprazole  40 mg Oral Q1200  . potassium citrate  20 mEq Oral BID  . QUEtiapine Fumarate  150-450 mg Oral BID  . simvastatin  20 mg Oral Daily  . topiramate  100  mg Oral BID  . venlafaxine  150 mg Oral BH-q7a  . DISCONTD:  ceFAZolin (ANCEF) IV  2 g Intravenous 60 min Pre-Op  . DISCONTD: chlorhexidine  60 mL Topical Once  . DISCONTD: scopolamine  1 patch Transdermal Once    Assessment: 61 year old female s/p TKA today (06/1511) to start Coumadin post-op for VTE  prophylaxis. Baseline INR from 06/15/11 was 0.96. Patient was not on Coumadin PTA. CBC from 06/15/11- Hgb 9.5, Plts 483. Patient is not on significant potentiators of Coumadin.   Goal of Therapy:  INR 2-3   Plan:  1. Coumadin 7.5mg  po x1 at 1800. 2. Daily PT/INR. 3. Coumadin book and video.  4. Will delay education until patient is in less pain.   Fayne Norrie 06/18/2011,5:07 PM

## 2011-06-18 NOTE — Progress Notes (Signed)
Pt is S/P L TKR. Knee precautiions. +cms. Pt is to be WBAT. CPM per order. Ice to L knee. Hemovac draining mod amount of bldy drainage. Ace dressing to L knee dry and intact. CPM applied at 1627. Pt is alert and oriented x 3. Neuro check is negative. Lungs CTA. Heart rate regular rate and rhythm. No s/sx cardiac or resp distress and no c/o such. Vital signs stable. Pt repts LBM 3/14. BS+x4. Pt abdomen is soft flat nontender and nondistended. Pt repts a history of postop nausea and vomiting. Pt has a scopalamine patch behind R ear. Foley draining mod amount of pale clear yellow urine. Pt instructed IS. Pt verb understanding and agrees to comply. Pt oriented to room and unit protocol. Pt family at bedside and supportive. Pt rates pain 4 on scale of 1-10. Call bell within reach. Will continue to monitor.

## 2011-06-19 LAB — CBC
HCT: 28.9 % — ABNORMAL LOW (ref 36.0–46.0)
Hemoglobin: 8.6 g/dL — ABNORMAL LOW (ref 12.0–15.0)
MCH: 21 pg — ABNORMAL LOW (ref 26.0–34.0)
MCHC: 29.8 g/dL — ABNORMAL LOW (ref 30.0–36.0)
MCV: 70.7 fL — ABNORMAL LOW (ref 78.0–100.0)
Platelets: 480 10*3/uL — ABNORMAL HIGH (ref 150–400)
RBC: 4.09 MIL/uL (ref 3.87–5.11)
RDW: 16.8 % — ABNORMAL HIGH (ref 11.5–15.5)
WBC: 11.1 10*3/uL — ABNORMAL HIGH (ref 4.0–10.5)

## 2011-06-19 LAB — BASIC METABOLIC PANEL
BUN: 7 mg/dL (ref 6–23)
CO2: 28 mEq/L (ref 19–32)
Calcium: 8.2 mg/dL — ABNORMAL LOW (ref 8.4–10.5)
Chloride: 98 mEq/L (ref 96–112)
Creatinine, Ser: 0.74 mg/dL (ref 0.50–1.10)
GFR calc Af Amer: >90 mL/min (ref >90)
GFR calc non Af Amer: >90 mL/min (ref >90)
Glucose, Bld: 211 mg/dL — ABNORMAL HIGH (ref 70–99)
Potassium: 3.1 mEq/L — ABNORMAL LOW (ref 3.5–5.1)
Sodium: 139 mEq/L (ref 135–145)

## 2011-06-19 LAB — PROTIME-INR
INR: 1.02 (ref 0.00–1.49)
Prothrombin Time: 13.6 seconds (ref 11.6–15.2)

## 2011-06-19 LAB — GLUCOSE, CAPILLARY: Glucose-Capillary: 232 mg/dL — ABNORMAL HIGH (ref 70–99)

## 2011-06-19 MED ORDER — POTASSIUM CHLORIDE CRYS ER 20 MEQ PO TBCR
40.0000 meq | EXTENDED_RELEASE_TABLET | Freq: Two times a day (BID) | ORAL | Status: DC
  Administered 2011-06-19: 20 meq via ORAL
  Administered 2011-06-19 – 2011-06-21 (×4): 40 meq via ORAL
  Filled 2011-06-19 (×2): qty 1
  Filled 2011-06-19 (×4): qty 2

## 2011-06-19 MED ORDER — WARFARIN SODIUM 7.5 MG PO TABS
7.5000 mg | ORAL_TABLET | Freq: Once | ORAL | Status: AC
  Administered 2011-06-19: 7.5 mg via ORAL
  Filled 2011-06-19: qty 1

## 2011-06-19 MED ORDER — WHITE PETROLATUM GEL
Status: AC
  Administered 2011-06-19: 13:00:00
  Filled 2011-06-19: qty 5

## 2011-06-19 NOTE — Progress Notes (Signed)
Physical Therapy Treatment Patient Details Name: Brittany Acosta MRN: [id number] DOB: 08-Jun-1950 Today's Date: 06/19/2011  PT Assessment/Plan  PT - Assessment/Plan Comments on Treatment Session: Pt progressing well.  Exercise handout provided. PT Plan: Discharge plan remains appropriate;Frequency remains appropriate PT Frequency: 7X/week Follow Up Recommendations: Skilled nursing facility Equipment Recommended: Defer to next venue PT Goals  Acute Rehab PT Goals PT Goal: Supine/Side to Sit - Progress: Progressing toward goal PT Goal: Sit to Supine/Side - Progress: Progressing toward goal PT Goal: Sit to Stand - Progress: Progressing toward goal PT Goal: Stand to Sit - Progress: Progressing toward goal PT Transfer Goal: Bed to Chair/Chair to Bed - Progress: Progressing toward goal PT Goal: Ambulate - Progress: Progressing toward goal PT Goal: Up/Down Stairs - Progress: Progressing toward goal PT Goal: Perform Home Exercise Program - Progress: Progressing toward goal  PT Treatment Precautions/Restrictions  Precautions Precautions: Knee Required Braces or Orthoses: No Restrictions Weight Bearing Restrictions: Yes LLE Weight Bearing: Weight bearing as tolerated Mobility (including Balance) Bed Mobility Supine to Sit: 4: Min assist;With rails;HOB elevated (Comment degrees) (30 degrees) Supine to Sit Details (indicate cue type and reason): verbal cues for sequencing Sit to Supine: 4: Min assist;With rail;HOB elevated (comment degrees) (30 degrees) Sit to Supine - Details (indicate cue type and reason): verbal cues for sequencing Transfers Sit to Stand: 4: Min assist;From bed;From chair/3-in-1;With upper extremity assist Sit to Stand Details (indicate cue type and reason): verbal cues for hand placement, sequencing, safety Stand to Sit: 4: Min assist;To bed;To chair/3-in-1;With upper extremity assist Stand to Sit Details: assist to control descent, verbal cues for sequencing Stand Pivot  Transfers: 4: Min assist Stand Pivot Transfer Details (indicate cue type and reason): with RW, bed to/from [facility] Ambulation/Gait Assistive device: Rolling walker Gait Pattern: Step-to pattern;Decreased stance time - left;Antalgic    Exercise  Total Joint Exercises Ankle Circles/Pumps: AROM;Both;10 reps;Supine Quad Sets: AAROM;Left;10 reps;Supine (neuromuscular facilitation to complete) Heel Slides: AAROM;Left;10 reps;Supine Hip ABduction/ADduction: AAROM;Left;10 reps;Supine End of Session PT - End of Session Equipment Utilized During Treatment: Gait belt Activity Tolerance: Patient tolerated treatment well Patient left: in bed;in CPM;with call bell in reach;with family/visitor present Nurse Communication: Mobility status for transfers General Behavior During Session: [facility] for tasks performed Cognition: [facility] for tasks performed  Ilda Foil 06/19/2011, 3:03 PM  Aida Raider, PT  Office # [phone] Pager [phone]

## 2011-06-19 NOTE — Progress Notes (Signed)
ANTICOAGULATION CONSULT NOTE - Follow Up Consult  Pharmacy Consult for coumadin Indication: VTE prophylaxis s/p L TKA  No Known Allergies  Vital Signs: Temp: 97.8 F (36.6 C) (03/16 0711) BP: 139/79 mmHg (03/16 0711) Pulse Rate: 92  (03/16 0711)  Labs:  Basename 06/19/11 0636  HGB 8.6*  HCT 28.9*  PLT 480*  APTT --  LABPROT 13.6  INR 1.02  HEPARINUNFRC --  CREATININE 0.74  CKTOTAL --  CKMB --  TROPONINI --   The CrCl is unknown because both a height and weight (above a minimum accepted value) are required for this calculation.   Medications:  Scheduled:    . ARIPiprazole  20 mg Oral QHS  . clonazePAM  2 mg Oral QHS  . coumadin book   Does not apply Once  . dextrose 5 % and 0.45 % NaCl with KCl 20 mEq/L      . estrogens (conjugated)  0.3 mg Oral Daily  . furosemide  40 mg Oral QHS  . hydrochlorothiazide  25 mg Oral Daily  . insulin aspart protamine-insulin aspart  17 Units Subcutaneous Q supper  . insulin aspart protamine-insulin aspart  30 Units Subcutaneous Q breakfast  . lamoTRIgine  200 mg Oral Q1200  . levothyroxine  50 mcg Oral Q0600  . metFORMIN  1,000 mg Oral BID WC  . mirtazapine  30 mg Oral QHS  . oxybutynin  10 mg Oral QPM  . pantoprazole  40 mg Oral Q1200  . potassium chloride  40 mEq Oral BID  . QUEtiapine Fumarate  150 mg Oral Daily  . QUEtiapine  450 mg Oral QHS  . simvastatin  20 mg Oral Q2000  . topiramate  100 mg Oral BID  . venlafaxine  150 mg Oral Daily  . warfarin  7.5 mg Oral ONCE-1800  . warfarin   Does not apply Once  . Warfarin - Pharmacist Dosing Inpatient   Does not apply q1800  . DISCONTD: Alpha-Lipoic Acid  300 mg Oral Daily  . DISCONTD:  ceFAZolin (ANCEF) IV  2 g Intravenous 60 min Pre-Op  . DISCONTD: chlorhexidine  60 mL Topical Once  . DISCONTD: insulin aspart  0-20 Units Subcutaneous TID WC  . DISCONTD: insulin aspart protamine-insulin aspart  17 Units Subcutaneous Q supper  . DISCONTD: insulin aspart protamine-insulin  aspart  30 Units Subcutaneous Q breakfast  . DISCONTD: Magnesium  100 mg Oral TID  . DISCONTD: potassium chloride  20 mEq Oral BID  . DISCONTD: potassium citrate  20 mEq Oral BID  . DISCONTD: QUEtiapine Fumarate  150-450 mg Oral BID  . DISCONTD: scopolamine  1 patch Transdermal Once    Assessment: 61 yo female s.p L TKA on coumadin day 2 and INR =1.02.  Goal of Therapy:  INR 2-3   Plan:  -Continue coumadin 7.5mg  today  Brittany Acosta 06/19/2011,11:57 AM

## 2011-06-19 NOTE — Progress Notes (Signed)
   CARE MANAGEMENT NOTE 06/19/2011  Patient:  RALENE, GASPARYAN   Account Number:  [id number]  Date Initiated:  06/19/2011  Documentation initiated by:  [facility]  Subjective/Objective Assessment:   TOTAL KNEE ARTHROPLASTY     Action/Plan:   Anticipated DC Date:  06/21/2011   Anticipated DC Plan:  HOME W HOME HEALTH SERVICES  In-house referral  Clinical Social Worker      DC Planning Services  CM consult      Choice offered to / List presented to:             Status of service:  Completed, signed off Medicare Important Message given?   (If response is "NO", the following Medicare IM given date fields will be blank) Date Medicare IM given:   Date Additional Medicare IM given:    Discharge Disposition:  SKILLED NURSING FACILITY  Per UR Regulation:    If discussed at Long Length of Stay Meetings, dates discussed:    Comments:  06/19/2011 1550 Spoke to pt and states she is going to [facility]. Contacted CSW for referral for SNF. PT recommended SNF after d/c. Isidoro Donning RN CCM Case Mgmt phone [phone]

## 2011-06-19 NOTE — Evaluation (Signed)
Physical Therapy Evaluation Patient Details Name: Brittany Acosta MRN: [id number] DOB: 1950-07-31 Today's Date: 06/19/2011  Problem List:  Patient Active Problem List  Diagnoses  . GOITER, MULTINODULAR  . HYPOTHYROIDISM  . IDDM  . HYPERCHOLESTEROLEMIA  . MORBID OBESITY  . BIPOLAR DISORDER UNSPECIFIED  . HYPERTENSION  . CHRONIC DIASTOLIC HEART FAILURE  . ALLERGIC RHINITIS  . APHTHOUS STOMATITIS  . EDEMA  . TRANSAMINASES, SERUM, ELEVATED  . ASYMPTOMATIC POSTMENOPAUSAL STATUS  . FIBROMYALGIA  . Unspecified vitamin D deficiency  . GERD (gastroesophageal reflux disease)  . Atypical chest pain    Past Medical History:  Past Medical History  Diagnosis Date  . GOITER, MULTINODULAR   . HYPOTHYROIDISM   . HYPERCHOLESTEROLEMIA   . Morbid obesity   . BIPOLAR DISORDER UNSPECIFIED     depression  . Chronic diastolic heart failure   . ALLERGIC RHINITIS   . APHTHOUS STOMATITIS   . TRANSAMINASES, SERUM, ELEVATED   . ASYMPTOMATIC POSTMENOPAUSAL STATUS   . FIBROMYALGIA   . IDDM     Type  2 DM x 10 years  . Chronic kidney disease     kidney stones; sees urologist @ [facility]  . Arthritis     rheumatoid arthritis knees and shoulders  . GERD (gastroesophageal reflux disease)   . History of wrist fracture     rt wrist  . Complication of anesthesia   . PONV (postoperative nausea and vomiting)    Past Surgical History:  Past Surgical History  Procedure Date  . Cholecystectomy 1985  . Abdominal hysterectomy 1976  . Tubal ligation 1972  . Left ovary removed 1980  . Right nasal surgery 08/1988  . Right sinus removed 08/1989    tooth partial  . Percitania stone removed (l) kidney 1992  . Lithotripsy (l) kidney 1997  . Left knee arthroscopy      Partial medical menisectomy  . Left cystoscopy  1990  . Right knee surgery  08/2009  . Breast surgery 2000    Breast bx- Left  . Cardiac catheterization 2001    sees Dr Peter [place]  . Thyroidectomy 04/22/2011    Procedure: THYROIDECTOMY;   Surgeon: Antony Contras, MD;  Location: [facility] OR;  Service: ENT;  Laterality: N/A;  TOTAL THYROIDECOTMY  . Cataract extraction, bilateral 02/2011    epps    PT Assessment/Plan/Recommendation PT Assessment Clinical Impression Statement: Pt is a 61 y.o. female who underwent L TKA 06-18-11.  D/C plan is for [facility].  PT intervention needed to progress mobility/gait, provide education, increase strength/ROM L knee for LTG of returning home independent. PT Recommendation/Assessment: Patient will need skilled PT in the acute care venue PT Problem List: Decreased strength;Decreased range of motion;Decreased activity tolerance;Decreased mobility;Decreased safety awareness;Pain Barriers to Discharge: None PT Therapy Diagnosis : Difficulty walking;Acute pain PT Plan PT Frequency: 7X/week PT Treatment/Interventions: DME instruction;Gait training;Stair training;Functional mobility training;Therapeutic activities;Therapeutic exercise;Patient/family education PT Recommendation Recommendations for Other Services: OT consult Follow Up Recommendations: Skilled nursing facility Equipment Recommended: Defer to next venue PT Goals  Acute Rehab PT Goals PT Goal Formulation: With patient Time For Goal Achievement: 7 days Pt will go Supine/Side to Sit: with modified independence PT Goal: Supine/Side to Sit - Progress: Goal set today Pt will go Sit to Supine/Side: with modified independence PT Goal: Sit to Supine/Side - Progress: Goal set today Pt will go Sit to Stand: with supervision PT Goal: Sit to Stand - Progress: Goal set today Pt will go Stand to Sit: with supervision PT Goal:  Stand to Sit - Progress: Goal set today Pt will Transfer Bed to Chair/Chair to Bed: with supervision PT Transfer Goal: Bed to Chair/Chair to Bed - Progress: Goal set today Pt will Ambulate: 51 - 150 feet;with supervision PT Goal: Ambulate - Progress: Goal set today Pt will Go Up / Down Stairs: 1-2 stairs;with min assist;with  least restrictive assistive device PT Goal: Up/Down Stairs - Progress: Goal set today Pt will Perform Home Exercise Program: with supervision, verbal cues required/provided PT Goal: Perform Home Exercise Program - Progress: Goal set today  PT Evaluation Precautions/Restrictions  Precautions Precautions: Knee Required Braces or Orthoses: No Restrictions LLE Weight Bearing: Weight bearing as tolerated Prior Functioning  Home Living Lives With: Spouse Receives Help From: Family Type of Home: House Home Layout: One level Home Access: Stairs to enter Entrance Stairs-Rails: None Entrance Stairs-Number of Steps: 2 Prior Function Level of Independence: Independent with basic ADLs;Independent with homemaking with ambulation;Independent with gait;Independent with transfers Cognition Cognition Arousal/Alertness: Lethargic Overall Cognitive Status: Difficult to assess Difficult to assess due to: level of arousal Orientation Level: Oriented X4 Sensation/Coordination   Extremity Assessment   Mobility (including Balance) Bed Mobility Bed Mobility: Yes Supine to Sit: 4: Min assist;HOB elevated (Comment degrees);With rails (45 degrees) Transfers Transfers: Yes Sit to Stand: 3: Mod assist;With upper extremity assist;From bed Sit to Stand Details (indicate cue type and reason): verbal cues for hand placement, sequencing Stand to Sit: 3: Mod assist Stand to Sit Details: verbal cues for sequencing, safety Ambulation/Gait Ambulation/Gait: Yes Ambulation/Gait Assistance: 3: Mod assist Ambulation/Gait Assistance Details (indicate cue type and reason): pt with L knee buckling during L stance phase, pt with LOB x 1 posteriorly requiring max assist to recover Ambulation Distance (Feet): 4 Feet Assistive device: Rolling walker Gait Pattern: Antalgic;Decreased stance time - left;Step-to pattern Gait velocity: decreased    Exercise  Total Joint Exercises Ankle Circles/Pumps: AROM;Both;10  reps Knee Flexion: AAROM;Left;Supine (5-75 degrees) End of Session PT - End of Session Equipment Utilized During Treatment: Gait belt Activity Tolerance: Patient limited by fatigue Patient left: in chair;with call bell in reach Nurse Communication: Mobility status for transfers;Mobility status for ambulation General Behavior During Session: Lethargic Cognition: WFL for tasks performed  Ilda Foil 06/19/2011, 10:21 AM  Aida Raider, PT  Office # [phone] Pager [phone]

## 2011-06-20 LAB — CBC
HCT: 28.6 % — ABNORMAL LOW (ref 36.0–46.0)
Hemoglobin: 8.6 g/dL — ABNORMAL LOW (ref 12.0–15.0)
MCH: 20.9 pg — ABNORMAL LOW (ref 26.0–34.0)
MCHC: 30.1 g/dL (ref 30.0–36.0)
MCV: 69.6 fL — ABNORMAL LOW (ref 78.0–100.0)
Platelets: 452 10*3/uL — ABNORMAL HIGH (ref 150–400)
RBC: 4.11 MIL/uL (ref 3.87–5.11)
RDW: 16.8 % — ABNORMAL HIGH (ref 11.5–15.5)
WBC: 15.4 10*3/uL — ABNORMAL HIGH (ref 4.0–10.5)

## 2011-06-20 LAB — GLUCOSE, CAPILLARY
Glucose-Capillary: 155 mg/dL — ABNORMAL HIGH (ref 70–99)
Glucose-Capillary: 200 mg/dL — ABNORMAL HIGH (ref 70–99)
Glucose-Capillary: 107 mg/dL — ABNORMAL HIGH (ref 70–99)
Glucose-Capillary: 173 mg/dL — ABNORMAL HIGH (ref 70–99)
Glucose-Capillary: 157 mg/dL — ABNORMAL HIGH (ref 70–99)
Glucose-Capillary: 176 mg/dL — ABNORMAL HIGH (ref 70–99)
Glucose-Capillary: 112 mg/dL — ABNORMAL HIGH (ref 70–99)

## 2011-06-20 LAB — PROTIME-INR
INR: 1.47 (ref 0.00–1.49)
Prothrombin Time: 18.1 seconds — ABNORMAL HIGH (ref 11.6–15.2)

## 2011-06-20 MED ORDER — WARFARIN SODIUM 7.5 MG PO TABS
7.5000 mg | ORAL_TABLET | Freq: Once | ORAL | Status: AC
  Administered 2011-06-20: 7.5 mg via ORAL
  Filled 2011-06-20: qty 1

## 2011-06-20 NOTE — Progress Notes (Signed)
Chart reviewed.  Note that pt. Will discharge to SNF likely first of week - will defer OT eval to SNF.  Jeani Hawking, OTR/L [phone]

## 2011-06-20 NOTE — Progress Notes (Signed)
PT Progress Note:     06/20/11 1000  PT Visit Information  Last PT Received On 06/20/11  Precautions  Precautions Knee  Restrictions  LLE Weight Bearing WBAT  Bed Mobility  Bed Mobility No  Transfers  Sit to Stand 4: Min assist;With armrests;From chair/3-in-1;With upper extremity assist  Sit to Stand Details (indicate cue type and reason) (A) to steady.  Cues for hand placement  Stand to Sit 4: Min assist;To chair/3-in-1;With armrests;With upper extremity assist  Stand to Sit Details (A) to control descent.  Cues for hand placement & Lt leg postioning to decrease pain/stress with descent  Ambulation/Gait  Ambulation/Gait Yes  Ambulation/Gait Assistance Other (comment) (Min Guard)  Ambulation/Gait Assistance Details (indicate cue type and reason) Cues for sequencing, upright posture, look ahead.  No knee buckling- pt reports feeling more steady today.  Relies heavily on RW with UE's.    Ambulation Distance (Feet) 50 Feet  Assistive device Rolling walker  Gait Pattern Step-to pattern;Decreased stance time - right;Decreased step length - right;Decreased step length - left;Decreased hip/knee flexion - left;Decreased weight shift to left  Exercises  Exercises Total Joint  Total Joint Exercises  Ankle Circles/Pumps AROM;Both;10 reps;Supine  Quad Sets AAROM;Left;10 reps;Seated  Long Arc Quad AAROM;Strengthening;Left;10 reps;Seated  PT - End of Session  Equipment Utilized During Treatment Gait belt  Activity Tolerance Patient tolerated treatment well  Patient left in chair;with call bell in reach;with family/visitor present  Nurse Communication Mobility status for transfers  General  Behavior During Session [facility] for tasks performed  [facility]ognition [facility] for tasks performed  PT - Assessment/Plan  Comments on Treatment Session Pt progressing with PT goals.  Increased ambulation distance today.  Reports mobiliizing is getting easier & she feels more steady on feet.   PT Plan Discharge plan  remains appropriate;Frequency remains appropriate  PT Frequency 7X/week  Follow Up Recommendations Skilled nursing facility  Equipment Recommended Defer to next venue  Acute Rehab PT Goals  PT Goal: Sit to Stand - Progress Progressing toward goal  PT Goal: Stand to Sit - Progress Progressing toward goal  PT Goal: Ambulate - Progress Progressing toward goal  PT Goal: Perform Home Exercise Program - Progress Progressing toward goal     Verdell Face, [place] [phone] 06/20/2011

## 2011-06-20 NOTE — Progress Notes (Signed)
Pt will require Level 2 PASARR for SNF. 30 day note on chart for MD's signature. Pt unable to d/c to SNF until PASARR number received and request for PASARR cannot be submitted without the 30 day note signed by MD. [facility] aware of level 2 PASARR requirement.  Dellie Burns, MSW, [place] [phone] (weekend)

## 2011-06-20 NOTE — Progress Notes (Signed)
Clinical Social Work Department BRIEF PSYCHOSOCIAL ASSESSMENT 06/20/2011  Patient:  Brittany Acosta, Brittany Acosta     Account Number:  [id number]     Admit date:  06/18/2011  Clinical Social Worker:  Skip Mayer  Date/Time:  06/20/2011 11:30 AM  Referred by:  Physician  Date Referred:  06/19/2011 Referred for  SNF Placement   Other Referral:   Interview type:  Patient Other interview type:   Pt's husband at bedside    PSYCHOSOCIAL DATA Living Status:  HUSBAND Admitted from facility:   Level of care:   Primary support name:  Alfredo Bach Primary support relationship to patient:  SPOUSE Degree of support available:   Adequate    CURRENT CONCERNS Current Concerns  Post-Acute Placement   Other Concerns:    SOCIAL WORK ASSESSMENT / PLAN CSW met with pt and pt's husband re: PT/OT recommendation for SNF. Pt reports pre-registered with [facility]. CSW confirmed pt has bed with Jasmine December in admissions at [place]. Pt will require a level 2 PASARR prior to SNF admission. 30 day note on pt's shadow chart for MD signature. 30 day note must be signed before level 2 PASARR request can be initiated. CSW completed PASARR form. Weekday CSW to f/u on 30 day note and submit for PASARR request if signed. [facility] aware of level 2.   Assessment/plan status:  Information/Referral to [facility] Other assessment/ plan:   Information/referral to community resources:   SNF    PATIENT'S/FAMILY'S RESPONSE TO PLAN OF CARE: Pt and pt's husband agreeable to SNF placement in order to increase strength and independence with mobility. Pt stated, "I really don't want to be around old people because I'm not old, but I know I need rehab." Pt verbalized understanding of level 2 PASARR need.     Dellie Burns, MSW, [place] [phone] (weekend)

## 2011-06-20 NOTE — Progress Notes (Signed)
PT Progress Note    06/20/11 1300  PT Visit Information  Last PT Received On 06/20/11  Precautions  Precautions Knee  Restrictions  LLE Weight Bearing WBAT  Bed Mobility  Supine to Sit 5: Supervision;HOB flat  Supine to Sit Details (indicate cue type and reason) cues for sequencing & technique.  Managed LLE independently.    Sit to Supine 4: Min assist;HOB flat  Sit to Supine - Details (indicate cue type and reason) (A) for LLE.  Cues for technique & use of UE's.   Transfers  Sit to Stand Other (comment);With upper extremity assist;From bed (Min Guard (A))  Sit to Stand Details (indicate cue type and reason) cues for hand placement  Stand to Sit Other (comment);To bed;With upper extremity assist (Min Guard (A))  Stand to Sit Details cues for hand placement  Ambulation/Gait  Ambulation/Gait Yes  Ambulation/Gait Assistance (Min Guard (A))  Ambulation/Gait Assistance Details (indicate cue type and reason) cues for sequencing, keep eyes open (pt seemed to be a little more drowsy this PM), encouragement to increase step length (Rt & Lt.)  Ambulation Distance (Feet) 30 Feet  Assistive device Rolling walker  Gait Pattern Step-to pattern;Decreased stance time - right;Decreased step length - right;Decreased step length - left;Decreased hip/knee flexion - left;Decreased weight shift to left  Gait velocity decreased  Stairs No  Wheelchair Mobility  Wheelchair Mobility No  Posture/Postural Control  Posture/Postural Control No significant limitations  Balance  Balance Assessed No  Exercises  Exercises Total Joint  Total Joint Exercises  Ankle Circles/Pumps AROM;Both;10 reps;Supine  Quad Sets AAROM;Left;10 reps;Seated  Heel Slides AAROM;Left;10 reps;Supine  Hip ABduction/ADduction AAROM;Left;10 reps;Supine  Straight Leg Raises AAROM;Strengthening;Left;10 reps;Supine  PT - End of Session  Equipment Utilized During Treatment Gait belt  Activity Tolerance Patient tolerated treatment  well  Patient left in bed;in CPM;with call bell in reach;with family/visitor present  General  Behavior During Session [facility] for tasks performed  Cognition [facility] for tasks performed  PT - Assessment/Plan  Comments on Treatment Session Pt placed in CPM at end of session- CPM: 0-45 degrees.    PT Plan Discharge plan remains appropriate;Frequency remains appropriate  PT Frequency 7X/week  Follow Up Recommendations Skilled nursing facility  Equipment Recommended Defer to next venue  Acute Rehab PT Goals  PT Goal: Supine/Side to Sit - Progress Not met  PT Goal: Sit to Supine/Side - Progress Not met  PT Goal: Sit to Stand - Progress Not met  PT Goal: Stand to Sit - Progress Not met  PT Goal: Ambulate - Progress Not met  PT Goal: Perform Home Exercise Program - Progress Not met     Verdell Face, [place] [phone] 06/20/2011

## 2011-06-20 NOTE — Progress Notes (Signed)
ANTICOAGULATION CONSULT NOTE - Follow Up Consult  Pharmacy Consult for coumadin Indication: VTE prophylaxis s/p L TKA  No Known Allergies  Vital Signs: Temp: 98.4 F (36.9 C) (03/17 0704) BP: 130/80 mmHg (03/17 0704) Pulse Rate: 101  (03/17 0704)  Labs:  Basename 06/20/11 0636 06/19/11 0636  HGB 8.6* 8.6*  HCT 28.6* 28.9*  PLT 452* 480*  APTT -- --  LABPROT 18.1* 13.6  INR 1.47 1.02  HEPARINUNFRC -- --  CREATININE -- 0.74  CKTOTAL -- --  CKMB -- --  TROPONINI -- --   The CrCl is unknown because both a height and weight (above a minimum accepted value) are required for this calculation.   Medications:  Scheduled:     . ARIPiprazole  20 mg Oral QHS  . clonazePAM  2 mg Oral QHS  . estrogens (conjugated)  0.3 mg Oral Daily  . furosemide  40 mg Oral QHS  . hydrochlorothiazide  25 mg Oral Daily  . insulin aspart protamine-insulin aspart  17 Units Subcutaneous Q supper  . insulin aspart protamine-insulin aspart  30 Units Subcutaneous Q breakfast  . lamoTRIgine  200 mg Oral Q1200  . levothyroxine  50 mcg Oral Q0600  . metFORMIN  1,000 mg Oral BID WC  . mirtazapine  30 mg Oral QHS  . oxybutynin  10 mg Oral QPM  . pantoprazole  40 mg Oral Q1200  . potassium chloride  40 mEq Oral BID  . QUEtiapine Fumarate  150 mg Oral Daily  . QUEtiapine  450 mg Oral QHS  . simvastatin  20 mg Oral Q2000  . topiramate  100 mg Oral BID  . venlafaxine  150 mg Oral Daily  . warfarin  7.5 mg Oral ONCE-1800  . warfarin   Does not apply Once  . Warfarin - Pharmacist Dosing Inpatient   Does not apply q1800  . white petrolatum        Assessment: 61 yo female s.p L TKA on coumadin day 3 and INR =1.47.  Goal of Therapy:  INR 2-3   Plan:  -Continue coumadin 7.5mg  today  Brittany Acosta 06/20/2011,11:39 AM

## 2011-06-20 NOTE — Progress Notes (Signed)
Patient reports moderate left knee pain   BP 130/80  Pulse 101  Temp(Src) 98.4 F (36.9 C) (Oral)  Resp 20  SpO2 98%  wound CDI NVI  Hg 8.6   POD #2 after left TKA  - continue to follow hg - up with PT -continue coumadin

## 2011-06-21 ENCOUNTER — Encounter (HOSPITAL_COMMUNITY): Payer: Self-pay | Admitting: Orthopedic Surgery

## 2011-06-21 DIAGNOSIS — M1712 Unilateral primary osteoarthritis, left knee: Secondary | ICD-10-CM | POA: Diagnosis present

## 2011-06-21 LAB — GLUCOSE, CAPILLARY
Glucose-Capillary: 177 mg/dL — ABNORMAL HIGH (ref 70–99)
Glucose-Capillary: 244 mg/dL — ABNORMAL HIGH (ref 70–99)

## 2011-06-21 LAB — CBC
HCT: 26.1 % — ABNORMAL LOW (ref 36.0–46.0)
Hemoglobin: 8.1 g/dL — ABNORMAL LOW (ref 12.0–15.0)
MCH: 21.5 pg — ABNORMAL LOW (ref 26.0–34.0)
MCHC: 31 g/dL (ref 30.0–36.0)
MCV: 69.2 fL — ABNORMAL LOW (ref 78.0–100.0)
Platelets: 468 10*3/uL — ABNORMAL HIGH (ref 150–400)
RBC: 3.77 MIL/uL — ABNORMAL LOW (ref 3.87–5.11)
RDW: 16.9 % — ABNORMAL HIGH (ref 11.5–15.5)
WBC: 12.7 10*3/uL — ABNORMAL HIGH (ref 4.0–10.5)

## 2011-06-21 LAB — PROTIME-INR
INR: 2.21 — ABNORMAL HIGH (ref 0.00–1.49)
Prothrombin Time: 24.9 seconds — ABNORMAL HIGH (ref 11.6–15.2)

## 2011-06-21 MED ORDER — GLUCOSE BLOOD VI STRP: 1.0000 | ORAL_STRIP | Freq: Two times a day (BID) | Status: DC

## 2011-06-21 MED ORDER — WARFARIN SODIUM 5 MG PO TABS: 5.0000 mg | ORAL_TABLET | Freq: Every day | ORAL | Status: DC

## 2011-06-21 MED ORDER — INSULIN PEN NEEDLE 31G X 5 MM MISC: 1.0000 | Freq: Three times a day (TID) | Status: DC

## 2011-06-21 MED ORDER — METHOCARBAMOL 500 MG PO TABS
500.0000 mg | ORAL_TABLET | Freq: Four times a day (QID) | ORAL | Status: AC | PRN
Start: 2011-06-21 — End: 2011-07-01

## 2011-06-21 MED ORDER — OXYCODONE-ACETAMINOPHEN 5-325 MG PO TABS: 1.0000 | ORAL_TABLET | ORAL | Status: AC | PRN

## 2011-06-21 NOTE — Progress Notes (Signed)
Clinical Social Work-CSW received passarr number-confirmed d/c with SNF, compiled d/c packet and arranged PTAR transport for pt to d/c to [facility] place-no further needs-Markees Carns-MSW, [phone]

## 2011-06-21 NOTE — Progress Notes (Signed)
UR COMPLETED  

## 2011-06-21 NOTE — Progress Notes (Signed)
Physical Therapy Treatment Note   06/21/11 1500  PT Visit Information  Last PT Received On 06/21/11  Precautions  Precautions Knee  Restrictions  LLE Weight Bearing WBAT  Bed Mobility  Supine to Sit 5: Supervision;HOB flat;With rails  Supine to Sit Details (indicate cue type and reason) strong use of bed rail  Transfers  Sit to Stand 5: Supervision (verbal cues for hand placement)  Stand to Sit 5: Supervision  Stand to Sit Details verbal cues to reach back for chair  Ambulation/Gait  Ambulation/Gait Assistance 4: Min assist  Ambulation/Gait Assistance Details (indicate cue type and reason) contact guard  Ambulation Distance (Feet) 120 Feet  Assistive device Rolling walker  Gait Pattern Step-to pattern;Decreased stride length;Decreased step length - left;Decreased stance time - left  Gait velocity increased time requierd  Stairs No  PT - End of Session  Equipment Utilized During Treatment Gait belt  Activity Tolerance Patient tolerated treatment well  Patient left in chair;with call bell in reach  General  Behavior During Session [facility] for tasks performed  Cognition [facility] for tasks performed  PT - Assessment/Plan  Comments on Treatment Session Patient assisted to/from bathroom. Patient supervision with tolieting and hygiene. Per RN report patient d/c to SNF this date.  PT Plan Discharge plan remains appropriate  Follow Up Recommendations Skilled nursing facility  Acute Rehab PT Goals  PT Goal: Supine/Side to Sit - Progress Progressing toward goal  PT Goal: Sit to Supine/Side - Progress Progressing toward goal  PT Goal: Sit to Stand - Progress Progressing toward goal  PT Goal: Stand to Sit - Progress Progressing toward goal  PT Transfer Goal: Bed to Chair/Chair to Bed - Progress Progressing toward goal  PT Goal: Ambulate - Progress Progressing toward goal  PT Goal: Perform Home Exercise Program - Progress Progressing toward goal    Pain: 5/10 L knee pain  Lewis Shock, PT,  DPT Pager #: [phone] Office #: [phone]

## 2011-06-21 NOTE — Discharge Summary (Signed)
Patient ID: Brittany Acosta MRN: [id number] DOB/AGE: Jan 12, 1951 61 y.o.  Admit date: 06/18/2011 Discharge date: 06/21/2011  Admission Diagnoses:  Active Problems:  Osteoarthritis of left knee   Discharge Diagnoses:  Same  Past Medical History  Diagnosis Date  . GOITER, MULTINODULAR   . HYPOTHYROIDISM   . HYPERCHOLESTEROLEMIA   . Morbid obesity   . BIPOLAR DISORDER UNSPECIFIED     depression  . Chronic diastolic heart failure   . ALLERGIC RHINITIS   . APHTHOUS STOMATITIS   . TRANSAMINASES, SERUM, ELEVATED   . ASYMPTOMATIC POSTMENOPAUSAL STATUS   . FIBROMYALGIA   . IDDM     Type  2 DM x 10 years  . Chronic kidney disease     kidney stones; sees urologist @ [facility]  . Arthritis     rheumatoid arthritis knees and shoulders  . GERD (gastroesophageal reflux disease)   . History of wrist fracture     rt wrist  . Complication of anesthesia   . PONV (postoperative nausea and vomiting)     Surgeries: Procedure(s): TOTAL KNEE ARTHROPLASTY on 06/18/2011   Consultants:    Discharged Condition: Improved  Hospital Course: Brittany Acosta is an 61 y.o. female who was admitted 06/18/2011 for operative treatment of<principal problem not specified>. Patient has severe unremitting pain that affects sleep, daily activities, and work/hobbies. After pre-op clearance the patient was taken to the operating room on 06/18/2011 and underwent  Procedure(s): TOTAL KNEE ARTHROPLASTY.    Patient was given perioperative antibiotics: Anti-infectives     Start     Dose/Rate Route Frequency Ordered Stop   06/18/11 1423   cefUROXime (ZINACEF) injection  Status:  Discontinued          As needed 06/18/11 1423 06/18/11 1528   06/18/11 0600   ceFAZolin (ANCEF) IVPB 2 g/50 mL premix  Status:  Discontinued        2 g 100 mL/hr over 30 Minutes Intravenous 60 min pre-op 06/17/11 1016 06/18/11 1648           Patient was given sequential compression devices, early ambulation, and chemoprophylaxis to prevent  DVT.  Patient benefited maximally from hospital stay and there were no complications.    Recent vital signs: Patient Vitals for the past 24 hrs:  BP Temp Temp src Pulse Resp SpO2  06/21/11 0624 137/64 mmHg 97.4 F (36.3 C) - 107  20  93 %  Jul 07, 2011 2224 159/69 mmHg 98.2 F (36.8 C) - 103  20  99 %  2011-07-07 1425 135/75 mmHg 98.3 F (36.8 C) Oral 85  18  98 %     Recent laboratory studies:  Basename 06/21/11 0528 07/07/2011 0636 06/19/11 0636  WBC 12.7* 15.4* --  HGB 8.1* 8.6* --  HCT 26.1* 28.6* --  PLT 468* 452* --  NA -- -- 139  K -- -- 3.1*  CL -- -- 98  CO2 -- -- 28  BUN -- -- 7  CREATININE -- -- 0.74  GLUCOSE -- -- 211*  INR 2.21* 1.47 --  CALCIUM -- -- 8.2*     Discharge Medications:   Medication List  As of 06/21/2011  7:58 AM   STOP taking these medications         aspirin 81 MG tablet      naproxen 500 MG tablet         TAKE these medications         Alpha-Lipoic Acid 300 MG Tabs   Take by mouth daily.  ARIPiprazole 20 MG tablet   Commonly known as: ABILIFY   Take 20 mg by mouth at bedtime.      atorvastatin 20 MG tablet   Commonly known as: LIPITOR   Take 1 tablet (20 mg total) by mouth daily.      Biotin 5000 MCG Caps   Take 1 capsule by mouth 3 (three) times daily.      CALCIUM 600 + D PO   Take 1 tablet by mouth 2 (two) times daily.      clonazePAM 1 MG tablet   Commonly known as: KLONOPIN   Take 2 mg by mouth at bedtime.      Co Q 10 60 MG Caps   Take 1 capsule by mouth 3 (three) times daily.      [employer]S ALCOHOL PREP SWABS 70 % Pads   USE AS DIRECTED      estrogens (conjugated) 0.3 MG tablet   Commonly known as: PREMARIN   Take 0.3 mg by mouth daily.      furosemide 40 MG tablet   Commonly known as: LASIX   Take 40 mg by mouth at bedtime.      glucose blood test strip   1 each by Other route 2 (two) times daily. Use as instructed to test blood sugar 2 times daily dx 250.01, and lancets 2/day      hydrochlorothiazide 25 MG  tablet   Commonly known as: HYDRODIURIL   Take 25 mg by mouth every morning.      indomethacin 75 MG CR capsule   Commonly known as: INDOCIN SR   Take 75 mg by mouth 4 (four) times daily as needed. For gout.      insulin aspart protamine-insulin aspart (70-30) 100 UNIT/ML injection   Commonly known as: NOVOLOG 70/30   Inject into the skin 2 (two) times daily before a meal. 30 units before breakfast, and 17 units before the evening meal, and pen needles 2/day.      Insulin Pen Needle 31G X 5 MM Misc   1 each by Other route 3 (three) times daily.      lamoTRIgine 200 MG tablet   Commonly known as: LAMICTAL   Take 200 mg by mouth daily at 12 noon.      levothyroxine 50 MCG tablet   Commonly known as: SYNTHROID, LEVOTHROID   Take 1 tablet (50 mcg total) by mouth daily.      Magnesium 100 MG Tabs   Take 100 mg by mouth 3 (three) times daily.      Manganese Gluconate 50 MG Tabs   Take 50 mg by mouth daily.      metFORMIN 1000 MG tablet   Commonly known as: GLUCOPHAGE   Take 1,000 mg by mouth 2 (two) times daily with a meal.      methocarbamol 500 MG tablet   Commonly known as: ROBAXIN   Take 1 tablet (500 mg total) by mouth every 6 (six) hours as needed.      mirtazapine 30 MG tablet   Commonly known as: REMERON   Take 30 mg by mouth at bedtime.      multivitamin tablet   Take 1 tablet by mouth daily.      NON FORMULARY   Hair Revitalizing formula take 2 tabs daily      omeprazole 20 MG capsule   Commonly known as: PRILOSEC   Take 1 capsule (20 mg total) by mouth 2 (two) times daily.  oxybutynin 10 MG 24 hr tablet   Commonly known as: DITROPAN-XL   Take 10 mg by mouth every evening.      oxyCODONE-acetaminophen 5-325 MG per tablet   Commonly known as: PERCOCET   Take 1-2 tablets by mouth every 4 (four) hours as needed.      SEROQUEL XR 150 MG 24 hr tablet   Generic drug: QUEtiapine Fumarate   Take 150-450 mg by mouth 2 (two) times daily. Take 1 every morning  and 3  At bedtime      thiamine 100 MG tablet   Commonly known as: VITAMIN B-1   Take 100 mg by mouth daily.      topiramate 100 MG tablet   Commonly known as: TOPAMAX   Take 100 mg by mouth 2 (two) times daily.      ULTIMATE PROBIOTIC FORMULA PO   Take 1 capsule by mouth daily.      UROCIT-K 10 10 MEQ (1080 MG) SR tablet   Generic drug: potassium citrate   Take 20 mEq by mouth 2 (two) times daily.      venlafaxine 150 MG 24 hr capsule   Commonly known as: EFFEXOR-XR   Take 150 mg by mouth every morning.      Vitamin D 2000 UNITS Caps   Take 1 capsule by mouth at bedtime.      warfarin 5 MG tablet   Commonly known as: COUMADIN   Take 1 tablet (5 mg total) by mouth daily.            Diagnostic Studies: No results found.  Disposition: Short term Health visitor  Discharge Orders    Future Appointments: Provider: Department: Dept Phone: Center:   07/19/2011 9:00 AM [facility] [employer] [phone] [facility]   07/19/2011 10:40 AM [facility] [phone] [facility]   09/13/2011 9:30 AM Romero Belling, MD [facility] [phone] [facility]   12/10/2011 10:00 AM Newt Lukes, MD [facility] [phone] [facility]     Future Orders Please Complete By Expires   Increase activity slowly      Dan Humphreys       May shower / Bathe      Driving Restrictions      Comments:   No driving for 2 weeks.   Change dressing (specify)      Comments:   Dressing change as needed.   Call MD for:  temperature >100.4      Call MD for:  severe uncontrolled pain      Call MD for:  redness, tenderness, or signs of infection (pain, swelling, redness, odor or green/yellow discharge around incision site)      Discharge instructions      Comments:   F/U with Dr. Turner Daniels in 10 days         Signed: Hazle Nordmann. 06/21/2011, 7:58 AM

## 2011-06-21 NOTE — Progress Notes (Signed)
Physical Therapy Treatment Note   06/21/11 1011  PT Visit Information  Last PT Received On 06/21/11  Precautions  Precautions Knee  Restrictions  LLE Weight Bearing WBAT  Bed Mobility  Bed Mobility (pt received sitting up in chair)  Transfers  Sit to Stand 4: Min assist  Sit to Stand Details (indicate cue type and reason) increased time, contact guard  Stand to Sit 5: Supervision  Stand to Sit Details pt with good hand placement  Ambulation/Gait  Ambulation/Gait Assistance 4: Min assist  Ambulation/Gait Assistance Details (indicate cue type and reason) pt with good sequencing, increased time required, no episodes of L Knee buckling  Ambulation Distance (Feet) 150 Feet  Assistive device Rolling walker  Gait Pattern Step-to pattern;Decreased step length - left;Decreased stance time - left  Gait velocity decreased  Stairs No  Total Joint Exercises  Quad Sets AROM;Left;10 reps  Heel Slides AROM;Left;AAROM;20 reps;Seated (with towel beneath foot)  Long Arc Quad AROM;Left;10 reps;Seated  PT - End of Session  Equipment Utilized During Treatment Gait belt  Activity Tolerance Patient tolerated treatment well  Patient left in chair;with call bell in reach  General  Behavior During Session [facility] for tasks performed  Cognition [facility] for tasks performed  PT - Assessment/Plan  Comments on Treatment Session Patient with improved ambulation tolerance this date. Patient achieve approx 60 deg of active L knee flexion this date and 75 degrees AAROM to L knee. patient remains to present "groggy" put did tolerate session very well.  PT Plan Discharge plan remains appropriate;Frequency remains appropriate  PT Frequency 7X/week  Follow Up Recommendations Skilled nursing facility  Equipment Recommended Defer to next venue  Acute Rehab PT Goals  PT Goal: Sit to Stand - Progress Progressing toward goal  PT Goal: Stand to Sit - Progress Progressing toward goal  PT Transfer Goal: Bed to Chair/Chair to  Bed - Progress Progressing toward goal  PT Goal: Ambulate - Progress Progressing toward goal     Pain: Patient reports pain in L knee to "not be too bad"  Lewis Shock, PT, DPT Pager #: [phone] Office #: [phone]

## 2011-06-21 NOTE — Progress Notes (Signed)
CSW faxed in documents for a 30 day waiver on a level II pasarr. DC summary sent to [facility]. Awaiting pasarr for DC.  Frederico Hamman, LCSW [phone]

## 2011-06-22 LAB — GLUCOSE, CAPILLARY: Glucose-Capillary: 121 mg/dL — ABNORMAL HIGH (ref 70–99)

## 2011-07-08 ENCOUNTER — Telehealth: Payer: Self-pay | Admitting: Cardiology

## 2011-07-08 NOTE — Telephone Encounter (Signed)
Patient called wanting to know if she still needs lab on 07/19/11 since she was recently in hospital.Patient advised hepatic panel is scheduled on 07/19/11 and that was not done in hospital.

## 2011-07-08 NOTE — Telephone Encounter (Signed)
Please return call to patient at [phone]  Patient had labs while she was in [facility] hospital and wants to know if she has to keep 4/15 lab appnt @ [facility].  Please return call to patient at [phone]

## 2011-07-19 ENCOUNTER — Ambulatory Visit
Admission: RE | Admit: 2011-07-19 | Discharge: 2011-07-19 | Disposition: A | Discharge status: Home / Self Care | Payer: Medicare Other | Source: Ambulatory Visit | Attending: Internal Medicine | Admitting: Internal Medicine

## 2011-07-19 ENCOUNTER — Ambulatory Visit: Payer: Medicare Other | Admitting: *Deleted

## 2011-07-19 DIAGNOSIS — Z1231 Encounter for screening mammogram for malignant neoplasm of breast: Secondary | ICD-10-CM

## 2011-07-19 DIAGNOSIS — E785 Hyperlipidemia, unspecified: Secondary | ICD-10-CM

## 2011-07-19 LAB — HEPATIC FUNCTION PANEL
ALT: 18 U/L (ref 0–35)
AST: 21 U/L (ref 0–37)
Albumin: 3.6 g/dL (ref 3.5–5.2)
Alkaline Phosphatase: 102 U/L (ref 39–117)
Bilirubin, Direct: 0 mg/dL (ref 0.0–0.3)
Total Bilirubin: 0.3 mg/dL (ref 0.3–1.2)
Total Protein: 6.7 g/dL (ref 6.0–8.3)

## 2011-07-22 ENCOUNTER — Other Ambulatory Visit: Payer: Self-pay

## 2011-07-22 MED ORDER — LEVOTHYROXINE SODIUM 50 MCG PO TABS: 50.0000 ug | ORAL_TABLET | Freq: Every day | ORAL | Status: DC

## 2011-07-22 MED ORDER — OMEPRAZOLE 20 MG PO CPDR: 20.0000 mg | DELAYED_RELEASE_CAPSULE | Freq: Two times a day (BID) | ORAL | Status: DC

## 2011-08-18 ENCOUNTER — Encounter: Payer: Self-pay | Admitting: Endocrinology

## 2011-08-18 ENCOUNTER — Ambulatory Visit (INDEPENDENT_AMBULATORY_CARE_PROVIDER_SITE_OTHER): Payer: Medicare Other | Admitting: Endocrinology

## 2011-08-18 VITALS — BP 132/72 | HR 98 | Temp 97.2°F | Ht 63.0 in | Wt 241.0 lb

## 2011-08-18 DIAGNOSIS — E1142 Type 2 diabetes mellitus with diabetic polyneuropathy: Secondary | ICD-10-CM

## 2011-08-18 DIAGNOSIS — E1065 Type 1 diabetes mellitus with hyperglycemia: Secondary | ICD-10-CM

## 2011-08-18 DIAGNOSIS — E1149 Type 2 diabetes mellitus with other diabetic neurological complication: Secondary | ICD-10-CM

## 2011-08-18 DIAGNOSIS — E1049 Type 1 diabetes mellitus with other diabetic neurological complication: Secondary | ICD-10-CM

## 2011-08-18 NOTE — Progress Notes (Signed)
Subjective:    Patient ID: Brittany Acosta, female    DOB: [birth year], 61 y.o.   MRN: [id number]  HPI Pt returns for f/u of insulin-requiring DM (dx'ed 2002, complicated by peripheral sensory neuropathy).  she brings a record of her cbg's which i have reviewed today.  It is frequently mildly low after the evening meal.  It is in the mid-100's at all other times of day. Past Medical History  Diagnosis Date  . GOITER, MULTINODULAR   . HYPOTHYROIDISM   . HYPERCHOLESTEROLEMIA   . Morbid obesity   . BIPOLAR DISORDER UNSPECIFIED     depression  . Chronic diastolic heart failure   . ALLERGIC RHINITIS   . APHTHOUS STOMATITIS   . TRANSAMINASES, SERUM, ELEVATED   . ASYMPTOMATIC POSTMENOPAUSAL STATUS   . FIBROMYALGIA   . IDDM     Type  2 DM x 10 years  . Chronic kidney disease     kidney stones; sees urologist @ [facility]  . Arthritis     rheumatoid arthritis knees and shoulders  . GERD (gastroesophageal reflux disease)   . History of wrist fracture     rt wrist  . Complication of anesthesia   . PONV (postoperative nausea and vomiting)     Past Surgical History  Procedure Date  . Cholecystectomy 1985  . Abdominal hysterectomy 1976  . Tubal ligation 1972  . Left ovary removed 1980  . Right nasal surgery 08/1988  . Right sinus removed 08/1989    tooth partial  . Percitania stone removed (l) kidney 1992  . Lithotripsy (l) kidney 1997  . Left knee arthroscopy      Partial medical menisectomy  . Left cystoscopy  1990  . Right knee surgery  08/2009  . Breast surgery 2000    Breast bx- Left  . Cardiac catheterization 2001    sees Dr Peter [place]  . Thyroidectomy 04/22/2011    Procedure: THYROIDECTOMY;  Surgeon: Antony Contras, MD;  Location: [facility] OR;  Service: ENT;  Laterality: N/A;  TOTAL THYROIDECOTMY  . Cataract extraction, bilateral 02/2011    epps  . Total knee arthroplasty 06/18/2011    Procedure: TOTAL KNEE ARTHROPLASTY;  Surgeon: Nestor Lewandowsky, MD;  Location: [facility] OR;  Service:  Orthopedics;  Laterality: Left;  DEPUY    History   Social History  . Marital Status: Married    Spouse Name: N/A    Number of Children: 2  . Years of Education: N/A   Occupational History  . disability    Social History Main Topics  . Smoking status: Former Smoker -- 1.0 packs/day for 6 years    Types: Cigarettes    Quit date: 04/06/1983  . Smokeless tobacco: Never Used   Comment: Married, lives with spouse. Disable- 2 grown kids-6 g-kids  . Alcohol Use: No  . Drug Use: No  . Sexually Active: Yes    Birth Control/ Protection: Surgical   Other Topics Concern  . Not on file   Social History Narrative   Married, lives with spouse - 2 adult childrendiabled    Current Outpatient Prescriptions on File Prior to Visit  Medication Sig Dispense Refill  . Alcohol Swabs ([facility]S ALCOHOL PREP SWABS) 70 % PADS USE AS DIRECTED  200 each  3  . Alpha-Lipoic Acid 300 MG TABS Take by mouth daily.      . ARIPiprazole (ABILIFY) 20 MG tablet Take 20 mg by mouth at bedtime.        Marland Kitchen atorvastatin (LIPITOR)  20 MG tablet Take 1 tablet (20 mg total) by mouth daily.  90 tablet  3  . Biotin 5000 MCG CAPS Take 1 capsule by mouth 3 (three) times daily.        . Calcium Carbonate-Vitamin D (CALCIUM 600 + D PO) Take 1 tablet by mouth 2 (two) times daily.        . Cholecalciferol (VITAMIN D) 2000 UNITS CAPS Take 1 capsule by mouth at bedtime.        . clonazePAM (KLONOPIN) 1 MG tablet Take 2 mg by mouth at bedtime.       . Coenzyme Q10 (CO Q 10) 60 MG CAPS Take 1 capsule by mouth 3 (three) times daily.        Marland Kitchen estrogens, conjugated, (PREMARIN) 0.3 MG tablet Take 0.3 mg by mouth daily.       . furosemide (LASIX) 40 MG tablet Take 40 mg by mouth at bedtime.      Marland Kitchen glucose blood (ONE TOUCH ULTRA TEST) test strip 1 each by Other route 2 (two) times daily. Use as instructed to test blood sugar 2 times daily dx 250.01, and lancets 2/day  180 each  3  . hydrochlorothiazide 25 MG tablet Take 25 mg by mouth every  morning.        . indomethacin (INDOCIN SR) 75 MG CR capsule Take 75 mg by mouth 4 (four) times daily as needed. For gout.      . insulin aspart protamine-insulin aspart (NOVOLOG 70/30) (70-30) 100 UNIT/ML injection Inject into the skin 2 (two) times daily before a meal. 30 units before breakfast, and 12 units before the evening meal, and pen needles 2/day.  10 mL    . Insulin Pen Needle (B-D UF III MINI PEN NEEDLES) 31G X 5 MM MISC 1 each by Other route 3 (three) times daily.  300 each  3  . Lactobacillus (ULTIMATE PROBIOTIC FORMULA PO) Take 1 capsule by mouth daily.        Marland Kitchen lamoTRIgine (LAMICTAL) 200 MG tablet Take 200 mg by mouth daily at 12 noon.        Marland Kitchen levothyroxine (SYNTHROID) 50 MCG tablet Take 1 tablet (50 mcg total) by mouth daily.  90 tablet  1  . Magnesium 100 MG TABS Take 100 mg by mouth 3 (three) times daily.       . Manganese Gluconate 50 MG TABS Take 50 mg by mouth daily.       . metFORMIN (GLUCOPHAGE) 1000 MG tablet Take 1,000 mg by mouth 2 (two) times daily with a meal.      . mirtazapine (REMERON) 30 MG tablet Take 30 mg by mouth at bedtime.        . Multiple Vitamin (MULTIVITAMIN) tablet Take 1 tablet by mouth daily.        . NON FORMULARY Hair Revitalizing formula take 2 tabs daily      . omeprazole (PRILOSEC) 20 MG capsule Take 1 capsule (20 mg total) by mouth 2 (two) times daily.  180 capsule  1  . oxybutynin (DITROPAN-XL) 10 MG 24 hr tablet Take 10 mg by mouth every evening.        . potassium citrate (UROCIT-K 10) 10 MEQ (1080 MG) SR tablet Take 20 mEq by mouth 2 (two) times daily.       . QUEtiapine Fumarate (SEROQUEL XR) 150 MG TB24 Take 150-450 mg by mouth 2 (two) times daily. Take 1 every morning and 3  At bedtime      .  Thiamine HCl (VITAMIN B-1) 100 MG tablet Take 100 mg by mouth daily.        Marland Kitchen topiramate (TOPAMAX) 100 MG tablet Take 100 mg by mouth 2 (two) times daily.        Marland Kitchen venlafaxine (EFFEXOR-XR) 150 MG 24 hr capsule Take 150 mg by mouth every morning.         . warfarin (COUMADIN) 5 MG tablet Take 1 tablet (5 mg total) by mouth daily.  30 tablet  0    No Known Allergies  Family History  Problem Relation Age of Onset  . Hypertension Mother   . Diabetes Father   . Hypertension Father   . Hyperlipidemia Father   . Heart attack Other   . Coronary artery disease Other     BP 132/72  Pulse 98  Temp(Src) 97.2 F (36.2 C) (Oral)  Ht 5\' 3"  (1.6 m)  Wt 241 lb (109.317 kg)  BMI 42.69 kg/m2  SpO2 98%  Review of Systems Denies loc    Objective:   Physical Exam VITAL SIGNS:  See vs page GENERAL: no distress PSYCH: Alert and oriented x 3.  Does not appear anxious nor depressed.   Lab Results  Component Value Date   HGBA1C 7.9* 06/11/2011      Assessment & Plan:  DM, Based on the pattern of her cbg's, she needs some adjustment in her therapy.  this is the best control this pt should aim for, given this regimen, which does match insulin to her changing needs throughout the day

## 2011-08-18 NOTE — Patient Instructions (Addendum)
Decrease insulin to 30 units with breakfast, and 12 with the evening meal.  Please come back for a follow-up appointment in 3 months check your blood sugar twice a day.  vary the time of day when you check, between before the 3 meals, and at bedtime.  also check if you have symptoms of your blood sugar being too high or too low.  please keep a record of the readings and bring it to your next appointment here.  please call us sooner if your blood sugar goes below 70, or if it stays over 200.

## 2011-08-27 ENCOUNTER — Other Ambulatory Visit: Payer: Self-pay | Admitting: Endocrinology

## 2011-09-01 ENCOUNTER — Other Ambulatory Visit: Payer: Self-pay | Admitting: *Deleted

## 2011-09-01 MED ORDER — GLUCOSE BLOOD VI STRP: ORAL_STRIP | Status: DC

## 2011-09-01 NOTE — Telephone Encounter (Signed)
R'cd fax from [facility] for refill of Onetouch Ultra test strips

## 2011-09-03 ENCOUNTER — Other Ambulatory Visit: Payer: Self-pay | Admitting: Internal Medicine

## 2011-09-03 MED ORDER — ESTROGENS CONJUGATED 0.3 MG PO TABS: 0.3000 mg | ORAL_TABLET | Freq: Every day | ORAL | Status: DC

## 2011-09-03 NOTE — Telephone Encounter (Signed)
[employer]/medicare part D/[place]--requesting primeran .3--fax#  [phone]---[place] ph#  [phone]

## 2011-09-03 NOTE — Telephone Encounter (Signed)
Faxed script back to [facility]... 09/03/11@12 :00pm/LMB

## 2011-09-13 ENCOUNTER — Ambulatory Visit: Payer: Medicare Other | Admitting: Endocrinology

## 2011-09-30 ENCOUNTER — Other Ambulatory Visit: Payer: Self-pay | Admitting: Internal Medicine

## 2011-10-08 ENCOUNTER — Ambulatory Visit: Payer: Medicare Other | Admitting: Endocrinology

## 2011-11-24 ENCOUNTER — Ambulatory Visit: Payer: Medicare Other | Admitting: Endocrinology

## 2011-12-07 ENCOUNTER — Ambulatory Visit (INDEPENDENT_AMBULATORY_CARE_PROVIDER_SITE_OTHER): Payer: Medicare Other | Admitting: Endocrinology

## 2011-12-07 ENCOUNTER — Encounter: Payer: Self-pay | Admitting: Endocrinology

## 2011-12-07 VITALS — BP 130/66 | HR 89 | Temp 96.8°F | Resp 16 | Wt 246.2 lb

## 2011-12-07 DIAGNOSIS — E1149 Type 2 diabetes mellitus with other diabetic neurological complication: Secondary | ICD-10-CM

## 2011-12-07 DIAGNOSIS — E1049 Type 1 diabetes mellitus with other diabetic neurological complication: Secondary | ICD-10-CM

## 2011-12-07 DIAGNOSIS — E1142 Type 2 diabetes mellitus with diabetic polyneuropathy: Secondary | ICD-10-CM

## 2011-12-07 DIAGNOSIS — E1065 Type 1 diabetes mellitus with hyperglycemia: Secondary | ICD-10-CM

## 2011-12-07 MED ORDER — INSULIN ASPART 100 UNIT/ML ~~LOC~~ SOLN: SUBCUTANEOUS | Status: DC

## 2011-12-07 MED ORDER — INSULIN PEN NEEDLE 31G X 5 MM MISC: 1.0000 | Freq: Two times a day (BID) | Status: DC

## 2011-12-07 MED ORDER — GLUCOSE BLOOD VI STRP: ORAL_STRIP | Status: DC

## 2011-12-07 MED ORDER — INSULIN ASPART PROT & ASPART (70-30 MIX) 100 UNIT/ML ~~LOC~~ SUSP: SUBCUTANEOUS | Status: DC

## 2011-12-07 NOTE — Progress Notes (Signed)
Subjective:    Patient ID: Brittany Acosta, female    DOB: November 01, 1950, 61 y.o.   MRN: [id number]  HPI Pt returns for f/u of insulin-requiring DM (dx'ed 2002, complicated by peripheral sensory neuropathy).  she brings a record of her cbg's which i have reviewed today.  It is frequently mildly low before lunch, if it has been more than 4 1/2 hrs since breakfast (and breakfast insulin).  It is highest in the afternoon Past Medical History  Diagnosis Date  . GOITER, MULTINODULAR   . HYPOTHYROIDISM   . HYPERCHOLESTEROLEMIA   . Morbid obesity   . BIPOLAR DISORDER UNSPECIFIED     depression  . Chronic diastolic heart failure   . ALLERGIC RHINITIS   . APHTHOUS STOMATITIS   . TRANSAMINASES, SERUM, ELEVATED   . ASYMPTOMATIC POSTMENOPAUSAL STATUS   . FIBROMYALGIA   . IDDM     Type  2 DM x 10 years  . Chronic kidney disease     kidney stones; sees urologist @ [facility]  . Arthritis     rheumatoid arthritis knees and shoulders  . GERD (gastroesophageal reflux disease)   . History of wrist fracture     rt wrist  . Complication of anesthesia   . PONV (postoperative nausea and vomiting)     Past Surgical History  Procedure Date  . Cholecystectomy 1985  . Abdominal hysterectomy 1976  . Tubal ligation 1972  . Left ovary removed 1980  . Right nasal surgery 08/1988  . Right sinus removed 08/1989    tooth partial  . Percitania stone removed (l) kidney 1992  . Lithotripsy (l) kidney 1997  . Left knee arthroscopy      Partial medical menisectomy  . Left cystoscopy  1990  . Right knee surgery  08/2009  . Breast surgery 2000    Breast bx- Left  . Cardiac catheterization 2001    sees Dr Peter [place]  . Thyroidectomy 04/22/2011    Procedure: THYROIDECTOMY;  Surgeon: Antony Contras, MD;  Location: [facility] OR;  Service: ENT;  Laterality: N/A;  TOTAL THYROIDECOTMY  . Cataract extraction, bilateral 02/2011    epps  . Total knee arthroplasty 06/18/2011    Procedure: TOTAL KNEE ARTHROPLASTY;  Surgeon: Nestor Lewandowsky, MD;  Location: [facility] OR;  Service: Orthopedics;  Laterality: Left;  DEPUY    History   Social History  . Marital Status: Married    Spouse Name: N/A    Number of Children: 2  . Years of Education: N/A   Occupational History  . disability    Social History Main Topics  . Smoking status: Former Smoker -- 1.0 packs/day for 6 years    Types: Cigarettes    Quit date: 04/06/1983  . Smokeless tobacco: Never Used   Comment: Married, lives with spouse. Disable- 2 grown kids-6 g-kids  . Alcohol Use: No  . Drug Use: No  . Sexually Active: Yes    Birth Control/ Protection: Surgical   Other Topics Concern  . Not on file   Social History Narrative   Married, lives with spouse - 2 adult childrendiabled    Current Outpatient Prescriptions on File Prior to Visit  Medication Sig Dispense Refill  . Alcohol Swabs ([employer] ALCOHOL PREP SWABS) 70 % PADS       . Alpha-Lipoic Acid 300 MG TABS Take by mouth daily.      . ARIPiprazole (ABILIFY) 20 MG tablet Take 20 mg by mouth at bedtime.        Marland Kitchen  atorvastatin (LIPITOR) 20 MG tablet Take 1 tablet (20 mg total) by mouth daily.  90 tablet  3  . Biotin 5000 MCG CAPS Take 1 capsule by mouth 3 (three) times daily.        . Calcium Carbonate-Vitamin D (CALCIUM 600 + D PO) Take 1 tablet by mouth 2 (two) times daily.        . Cholecalciferol (VITAMIN D) 2000 UNITS CAPS Take 1 capsule by mouth at bedtime.        . clonazePAM (KLONOPIN) 1 MG tablet Take 2 mg by mouth at bedtime.       . Coenzyme Q10 (CO Q 10) 60 MG CAPS Take 1 capsule by mouth 3 (three) times daily.        Marland Kitchen estrogens, conjugated, (PREMARIN) 0.3 MG tablet Take 1 tablet (0.3 mg total) by mouth daily.  90 tablet  1  . furosemide (LASIX) 40 MG tablet Take 40 mg by mouth at bedtime.      . hydrochlorothiazide 25 MG tablet Take 25 mg by mouth every morning.        . indomethacin (INDOCIN SR) 75 MG CR capsule Take 75 mg by mouth 4 (four) times daily as needed. For gout.      . Lactobacillus  (ULTIMATE PROBIOTIC FORMULA PO) Take 1 capsule by mouth daily.        Marland Kitchen lamoTRIgine (LAMICTAL) 200 MG tablet Take 200 mg by mouth daily at 12 noon.        Marland Kitchen levothyroxine (SYNTHROID) 50 MCG tablet Take 1 tablet (50 mcg total) by mouth daily.  90 tablet  1  . Magnesium 100 MG TABS Take 100 mg by mouth 3 (three) times daily.       . Manganese Gluconate 50 MG TABS Take 50 mg by mouth daily.       . meloxicam (MOBIC) 15 MG tablet Take 15 mg by mouth daily.      . mirtazapine (REMERON) 30 MG tablet Take 30 mg by mouth at bedtime.        . Multiple Vitamin (MULTIVITAMIN) tablet Take 1 tablet by mouth daily.        . NON FORMULARY Hair Revitalizing formula take 2 tabs daily      . omeprazole (PRILOSEC) 20 MG capsule TAKE 1 CAPSULE TWICE DAILY  180 capsule  1  . oxybutynin (DITROPAN-XL) 10 MG 24 hr tablet Take 10 mg by mouth every evening.        Marland Kitchen oxyCODONE-acetaminophen (PERCOCET) 5-325 MG per tablet 1 tablet every 4-6 hours as needed for pain      . potassium citrate (UROCIT-K 10) 10 MEQ (1080 MG) SR tablet Take 20 mEq by mouth 2 (two) times daily.       . QUEtiapine Fumarate (SEROQUEL XR) 150 MG TB24 Take 150-450 mg by mouth 2 (two) times daily. Take 1 every morning and 3  At bedtime      . Thiamine HCl (VITAMIN B-1) 100 MG tablet Take 100 mg by mouth daily.        Marland Kitchen topiramate (TOPAMAX) 100 MG tablet Take 100 mg by mouth 2 (two) times daily.        Marland Kitchen venlafaxine (EFFEXOR-XR) 150 MG 24 hr capsule Take 150 mg by mouth every morning.        . warfarin (COUMADIN) 5 MG tablet Take 1 tablet (5 mg total) by mouth daily.  30 tablet  0    No Known Allergies  Family History  Problem  Relation Age of Onset  . Hypertension Mother   . Diabetes Father   . Hypertension Father   . Hyperlipidemia Father   . Heart attack Other   . Coronary artery disease Other     BP 130/66  Pulse 89  Temp 96.8 F (36 C) (Oral)  Resp 16  Wt 246 lb 4 oz (111.698 kg)  SpO2 98%  Review of Systems Denies LOC      Objective:   Physical Exam VITAL SIGNS:  See vs page GENERAL: no distress Pulses: dorsalis pedis intact bilat.   Feet: no deformity.  no ulcer on the feet.  feet are of normal color and temp.  1+ right leg edema (trace on the left).  There is bilateral onychomycosis. Neuro: sensation is intact to touch on the feet, but decreased from normal.        Assessment & Plan:

## 2011-12-07 NOTE — Patient Instructions (Addendum)
continue insulin, 30 units with breakfast, and 12 with the evening meal.  Y\on this type of insulin, you should eat lunch no later than 4 hrs after breakfast.   Stop metformin.  Please call if this stoppage makes your morning blood sugar go up.   blood tests are being requested for you today.  You will receive a letter with results. Please come back for a follow-up appointment in 3 months.   check your blood sugar twice a day.  vary the time of day when you check, between before the 3 meals, and at bedtime.  also check if you have symptoms of your blood sugar being too high or too low.  please keep a record of the readings and bring it to your next appointment here.  please call us sooner if your blood sugar goes below 70, or if it stays over 200.

## 2011-12-09 ENCOUNTER — Encounter: Payer: Self-pay | Admitting: Internal Medicine

## 2011-12-09 ENCOUNTER — Other Ambulatory Visit (INDEPENDENT_AMBULATORY_CARE_PROVIDER_SITE_OTHER): Payer: Medicare Other

## 2011-12-09 ENCOUNTER — Ambulatory Visit (INDEPENDENT_AMBULATORY_CARE_PROVIDER_SITE_OTHER): Payer: Medicare Other | Admitting: Internal Medicine

## 2011-12-09 VITALS — BP 128/70 | HR 90 | Temp 97.0°F | Resp 18

## 2011-12-09 DIAGNOSIS — E1149 Type 2 diabetes mellitus with other diabetic neurological complication: Secondary | ICD-10-CM

## 2011-12-09 DIAGNOSIS — E039 Hypothyroidism, unspecified: Secondary | ICD-10-CM

## 2011-12-09 DIAGNOSIS — E1049 Type 1 diabetes mellitus with other diabetic neurological complication: Secondary | ICD-10-CM

## 2011-12-09 DIAGNOSIS — E1065 Type 1 diabetes mellitus with hyperglycemia: Secondary | ICD-10-CM

## 2011-12-09 DIAGNOSIS — M1710 Unilateral primary osteoarthritis, unspecified knee: Secondary | ICD-10-CM

## 2011-12-09 DIAGNOSIS — E1142 Type 2 diabetes mellitus with diabetic polyneuropathy: Secondary | ICD-10-CM

## 2011-12-09 DIAGNOSIS — M1712 Unilateral primary osteoarthritis, left knee: Secondary | ICD-10-CM

## 2011-12-09 DIAGNOSIS — IMO0002 Reserved for concepts with insufficient information to code with codable children: Secondary | ICD-10-CM

## 2011-12-09 LAB — HEMOGLOBIN A1C: Hgb A1c MFr Bld: 7.1 % — ABNORMAL HIGH (ref 4.6–6.5)

## 2011-12-09 LAB — TSH: TSH: 7.09 u[IU]/mL — ABNORMAL HIGH (ref 0.35–5.50)

## 2011-12-09 NOTE — Patient Instructions (Addendum)
It was good to see you today. We have reviewed your interval medical records including labs and tests today. Medications reviewed and updated, no changes at this time.  Test(s) ordered today. Your results will be called to you after review (48-72hours after test completion). If any changes need to be made, you will be notified at that time. Please schedule followup in 6 months, call sooner if problems.

## 2011-12-09 NOTE — Assessment & Plan Note (Signed)
Reports increasing peripheral neuropathy symptoms Follows with podiatry and endo for same -  on gabapentin since 9/12 in addition to elavil  - No ASA on coumadin, but on statin Check a1c now - meds managed by endo - 70/30 only, metformin stopped 12/2011 The current medical regimen appears effective;  continue present plan and medications.  Lab Results  Component Value Date   HGBA1C 7.9* 06/11/2011

## 2011-12-09 NOTE — Progress Notes (Signed)
Subjective:    Patient ID: Brittany Acosta, female    DOB: 01-Oct-1950, 61 y.o.   MRN: [id number]  HPI  here for follow up - reviewed chronic medical issues:  DM2 - follows with endo for same. reports compliance with ongoing medical treatment and no changes in medication dose or frequency. denies adverse side effects related to current therapy.  home cbg log reviewed - no hypoglycemia events - improved neuropathy - less feet tingling and pain on current meds- working with podiatry for same  HTN - reports compliance with ongoing medical treatment and no changes in medication dose or frequency. denies adverse side effects related to current therapy.   dyslipidemia- on statin, reports compliance with ongoing medical treatment and no changes in medication dose or frequency. denies adverse side effects related to current therapy.   hypothyroid - s/p L partial thyroidectomy January 2013 procedure for goiter with mass effect -reports compliance with ongoing medical treatment and no changes in medication dose or frequency. denies adverse side effects related to current therapy.   Past Medical History  Diagnosis Date  . GOITER, MULTINODULAR   . HYPOTHYROIDISM   . HYPERCHOLESTEROLEMIA   . Morbid obesity   . BIPOLAR DISORDER UNSPECIFIED     depression  . Chronic diastolic heart failure   . ALLERGIC RHINITIS   . APHTHOUS STOMATITIS   . TRANSAMINASES, SERUM, ELEVATED   . ASYMPTOMATIC POSTMENOPAUSAL STATUS   . FIBROMYALGIA   . IDDM     Type  2 DM x 10 years  . Chronic kidney disease     kidney stones; sees urologist @ [facility]  . Arthritis     rheumatoid arthritis knees and shoulders  . GERD (gastroesophageal reflux disease)   . History of wrist fracture     rt wrist    Review of Systems  Constitutional: Negative for fever and unexpected weight change.  HENT: Negative for facial swelling and neck pain.   Respiratory: Negative for cough.   Cardiovascular: Negative for chest pain and leg  swelling.  Neurological: Negative for dizziness and headaches.      Objective:   Physical Exam  BP 128/70  Pulse 90  Temp 97 F (36.1 C)  Resp 18  SpO2 95%     Wt Readings from Last 3 Encounters:  12/07/11 246 lb 4 oz (111.698 kg)  08/18/11 241 lb (109.317 kg)  06/15/11 246 lb 11.2 oz (111.902 kg)   Constitutional: She is overweight. She appears well-developed and well-nourished. No distress.  Neck: thyroidectomy scar, mild soft tissue swelling without fluid or mass - Normal range of motion. Neck supple. No JVD present. Cardiovascular: Normal rate, regular rhythm and normal heart sounds.  No murmur heard. no BLE edema Pulmonary/Chest: Effort normal and breath sounds normal. No respiratory distress. She has no wheezes.  Musculoskeletal:  s/p L TKR - B knee with boggy synovitis - tender to palpation over joint line; FROM and ligamentous function intact - no gross effusions Neurological: She is alert and oriented to person, place, and time. No cranial nerve deficit. MAE well; Coordination and balance normal.   Lab Results  Component Value Date   WBC 12.7* 06/21/2011   HGB 8.1* 06/21/2011   HCT 26.1* 06/21/2011   PLT 468* 06/21/2011   CHOL 128 06/11/2011   TRIG 228.0* 06/11/2011   HDL 50.00 06/11/2011   LDLDIRECT 56.4 06/11/2011   ALT 18 07/19/2011   AST 21 07/19/2011   NA 139 06/19/2011   K 3.1* 06/19/2011  CL 98 06/19/2011   CREATININE 0.74 06/19/2011   BUN 7 06/19/2011   CO2 28 06/19/2011   TSH 2.45 06/11/2011   INR 2.21* 06/21/2011   HGBA1C 7.9* 06/11/2011   MICROALBUR 0.2 12/24/2008   Assessment & Plan:  See problem list. Medications and labs reviewed today.

## 2011-12-09 NOTE — Assessment & Plan Note (Signed)
Check TSH now, adjust as needed 04/2011 underwent L thyroid lobectomy due to goiter with mass effect The current medical regimen is effective;  continue present plan and medications. Lab Results  Component Value Date   TSH 2.45 06/11/2011

## 2011-12-09 NOTE — Assessment & Plan Note (Signed)
S/p TKR 06/2011 - Planning R RTC repair 12/2011 - Turner Daniels

## 2011-12-10 ENCOUNTER — Ambulatory Visit: Payer: Medicare Other | Admitting: Internal Medicine

## 2011-12-10 ENCOUNTER — Ambulatory Visit: Payer: Medicare Other | Admitting: Endocrinology

## 2011-12-12 ENCOUNTER — Other Ambulatory Visit: Payer: Self-pay | Admitting: Internal Medicine

## 2011-12-13 ENCOUNTER — Telehealth: Payer: Self-pay | Admitting: Internal Medicine

## 2011-12-13 ENCOUNTER — Telehealth: Payer: Self-pay | Admitting: Endocrinology

## 2011-12-13 DIAGNOSIS — R17 Unspecified jaundice: Secondary | ICD-10-CM

## 2011-12-13 NOTE — Telephone Encounter (Signed)
It would be returned if i received the fax -  They may fax form request again if not yet received by their agency

## 2011-12-13 NOTE — Telephone Encounter (Signed)
No problem seen Dr. Felicity Coyer is PCP will route it there.

## 2011-12-13 NOTE — Telephone Encounter (Signed)
Caller: Yuleidy/Patient; Phone: [phone]; Reason for Call: Patient would like to talk to Dr Felicity Coyer regarding a follow-up blood test.  Please call back when possible.

## 2011-12-13 NOTE — Telephone Encounter (Signed)
i only see pt for DM, so it probably not come to me

## 2011-12-13 NOTE — Telephone Encounter (Signed)
Kristy of [facility] called to see if we received orders for a knee brace for Pt. Order was sent on 12/07/11. Please Advise.

## 2011-12-14 NOTE — Telephone Encounter (Signed)
Notified pt md ok order....Raechel Chute

## 2011-12-14 NOTE — Telephone Encounter (Signed)
Called pt to clarify what she needed. Patient states she had surgery on her (R) shoulder on Friday was told by nurse she look like she had jaundice. Requesting to have labs done to see if she have jaundice...Raechel Chute

## 2011-12-14 NOTE — Telephone Encounter (Signed)
Lab ordered.

## 2011-12-15 ENCOUNTER — Other Ambulatory Visit (INDEPENDENT_AMBULATORY_CARE_PROVIDER_SITE_OTHER): Payer: Medicare Other

## 2011-12-15 DIAGNOSIS — R17 Unspecified jaundice: Secondary | ICD-10-CM

## 2011-12-15 LAB — HEPATIC FUNCTION PANEL
ALT: 20 U/L (ref 0–35)
AST: 26 U/L (ref 0–37)
Albumin: 3 g/dL — ABNORMAL LOW (ref 3.5–5.2)
Alkaline Phosphatase: 105 U/L (ref 39–117)
Bilirubin, Direct: 0.1 mg/dL (ref 0.0–0.3)
Total Bilirubin: 0.2 mg/dL — ABNORMAL LOW (ref 0.3–1.2)
Total Protein: 6.2 g/dL (ref 6.0–8.3)

## 2011-12-15 NOTE — Telephone Encounter (Signed)
Called [facility] Advised them to resend if need be.

## 2011-12-22 ENCOUNTER — Telehealth: Payer: Self-pay | Admitting: Internal Medicine

## 2011-12-22 NOTE — Telephone Encounter (Signed)
Noted thanks °

## 2011-12-22 NOTE — Telephone Encounter (Signed)
Patient calling, had right shoulder surgery on 9/6.  Has worsening shoulder pain, knee pain with tingling in her legs and neck.  Has had same "for the last couple of days".  No fever but having some chills.   Triaged per Tingling, needs to be seen in 72 hours.  RN override to have her seen with first available appt.  Same scheduled for 9/19 at 1145 with MD.

## 2011-12-23 ENCOUNTER — Other Ambulatory Visit (INDEPENDENT_AMBULATORY_CARE_PROVIDER_SITE_OTHER): Payer: Medicare Other

## 2011-12-23 ENCOUNTER — Ambulatory Visit (INDEPENDENT_AMBULATORY_CARE_PROVIDER_SITE_OTHER): Payer: Medicare Other | Admitting: Internal Medicine

## 2011-12-23 ENCOUNTER — Encounter: Payer: Self-pay | Admitting: Internal Medicine

## 2011-12-23 ENCOUNTER — Ambulatory Visit: Payer: Medicare Other

## 2011-12-23 ENCOUNTER — Encounter: Payer: Self-pay | Admitting: Endocrinology

## 2011-12-23 VITALS — BP 130/68 | HR 86 | Temp 97.5°F | Resp 15 | Wt 233.1 lb

## 2011-12-23 DIAGNOSIS — R6883 Chills (without fever): Secondary | ICD-10-CM

## 2011-12-23 DIAGNOSIS — G629 Polyneuropathy, unspecified: Secondary | ICD-10-CM

## 2011-12-23 DIAGNOSIS — D62 Acute posthemorrhagic anemia: Secondary | ICD-10-CM

## 2011-12-23 DIAGNOSIS — E1049 Type 1 diabetes mellitus with other diabetic neurological complication: Secondary | ICD-10-CM

## 2011-12-23 DIAGNOSIS — E1149 Type 2 diabetes mellitus with other diabetic neurological complication: Secondary | ICD-10-CM

## 2011-12-23 DIAGNOSIS — G609 Hereditary and idiopathic neuropathy, unspecified: Secondary | ICD-10-CM

## 2011-12-23 DIAGNOSIS — E1065 Type 1 diabetes mellitus with hyperglycemia: Secondary | ICD-10-CM

## 2011-12-23 DIAGNOSIS — E1142 Type 2 diabetes mellitus with diabetic polyneuropathy: Secondary | ICD-10-CM

## 2011-12-23 LAB — BASIC METABOLIC PANEL
BUN: 14 mg/dL (ref 6–23)
CO2: 29 mEq/L (ref 19–32)
Calcium: 9.4 mg/dL (ref 8.4–10.5)
Chloride: 96 mEq/L (ref 96–112)
Creatinine, Ser: 1 mg/dL (ref 0.4–1.2)
GFR: 63.49 mL/min (ref >60.00)
Glucose, Bld: 117 mg/dL — ABNORMAL HIGH (ref 70–99)
Potassium: 3.5 mEq/L (ref 3.5–5.1)
Sodium: 136 mEq/L (ref 135–145)

## 2011-12-23 LAB — URINALYSIS, ROUTINE W REFLEX MICROSCOPIC
Bilirubin Urine: NEGATIVE
Hgb urine dipstick: NEGATIVE
Ketones, ur: NEGATIVE
Leukocytes, UA: NEGATIVE
Nitrite: NEGATIVE
Specific Gravity, Urine: 1.01 (ref 1.000–1.030)
Total Protein, Urine: NEGATIVE
Urine Glucose: NEGATIVE
Urobilinogen, UA: 0.2 (ref 0.0–1.0)
pH: 7.5 (ref 5.0–8.0)

## 2011-12-23 LAB — CBC WITH DIFFERENTIAL/PLATELET
Basophils Absolute: 0.1 10*3/uL (ref 0.0–0.1)
Basophils Relative: 1 % (ref 0–1)
Eosinophils Absolute: 0.6 10*3/uL (ref 0.0–0.7)
Eosinophils Relative: 4 % (ref 0–5)
HCT: 29.1 % — ABNORMAL LOW (ref 36.0–46.0)
Hemoglobin: 8.4 g/dL — ABNORMAL LOW (ref 12.0–15.0)
Lymphocytes Relative: 37 % (ref 12–46)
Lymphs Abs: 4.7 10*3/uL — ABNORMAL HIGH (ref 0.7–4.0)
MCH: 17.5 pg — ABNORMAL LOW (ref 26.0–34.0)
MCHC: 28.9 g/dL — ABNORMAL LOW (ref 30.0–36.0)
MCV: 60.5 fL — ABNORMAL LOW (ref 78.0–100.0)
Monocytes Absolute: 0.9 10*3/uL (ref 0.1–1.0)
Monocytes Relative: 7 % (ref 3–12)
Neutro Abs: 6.6 10*3/uL (ref 1.7–7.7)
Neutrophils Relative %: 51 % (ref 43–77)
Platelets: 607 10*3/uL — ABNORMAL HIGH (ref 150–400)
RBC: 4.81 MIL/uL (ref 3.87–5.11)
RDW: 20 % — ABNORMAL HIGH (ref 11.5–15.5)
WBC: 12.8 10*3/uL — ABNORMAL HIGH (ref 4.0–10.5)

## 2011-12-23 LAB — FERRITIN: Ferritin: 4.2 ng/mL — ABNORMAL LOW (ref 10.0–291.0)

## 2011-12-23 LAB — VITAMIN B12: Vitamin B-12: 1106 pg/mL — ABNORMAL HIGH (ref 211–911)

## 2011-12-23 LAB — HEMOGLOBIN A1C: Hgb A1c MFr Bld: 7.3 % — ABNORMAL HIGH (ref 4.6–6.5)

## 2011-12-23 MED ORDER — GABAPENTIN 300 MG PO CAPS: 300.0000 mg | ORAL_CAPSULE | Freq: Three times a day (TID) | ORAL | Status: DC

## 2011-12-23 NOTE — Progress Notes (Signed)
Subjective:    Patient ID: Brittany Acosta, female    DOB: 09-Oct-1950, 61 y.o.   MRN: [id number]  HPI  See CC -  S/p R shoulder surgery 2 weeks ago - now with increased tingling bilateral hands. No weakness and no swelling Increase in chronic fibromyalgia aches and pains Also increase in burning of bilateral feet, question neuropathy (see DM below)  also reviewed chronic medical issues:  DM2 - follows with endo for same. reports compliance with ongoing medical treatment and no changes in medication dose or frequency. denies adverse side effects related to current therapy.  home cbg log reviewed - no hypoglycemia events - associated with neuropathy - working with podiatry for same  HTN - reports compliance with ongoing medical treatment and no changes in medication dose or frequency. denies adverse side effects related to current therapy.   dyslipidemia- on statin, reports compliance with ongoing medical treatment and no changes in medication dose or frequency. denies adverse side effects related to current therapy.   hypothyroid - s/p L partial thyroidectomy January 2013 procedure for goiter with mass effect -reports compliance with ongoing medical treatment and no changes in medication dose or frequency. denies adverse side effects related to current therapy.   Past Medical History  Diagnosis Date  . GOITER, MULTINODULAR   . HYPOTHYROIDISM   . HYPERCHOLESTEROLEMIA   . Morbid obesity   . BIPOLAR DISORDER UNSPECIFIED     depression  . Chronic diastolic heart failure   . ALLERGIC RHINITIS   . APHTHOUS STOMATITIS   . TRANSAMINASES, SERUM, ELEVATED   . ASYMPTOMATIC POSTMENOPAUSAL STATUS   . FIBROMYALGIA   . IDDM     Type  2 DM x 10 years  . Chronic kidney disease     kidney stones; sees urologist @ [facility]  . Arthritis     rheumatoid arthritis knees and shoulders  . GERD (gastroesophageal reflux disease)   . History of wrist fracture     rt wrist    Review of Systems    Constitutional: Negative for fever and unexpected weight change.  HENT: Negative for facial swelling and neck pain.   Respiratory: Negative for cough.   Cardiovascular: Negative for chest pain and leg swelling.  Neurological: Negative for dizziness and headaches.      Objective:   Physical Exam  BP 130/68  Pulse 86  Temp 97.5 F (36.4 C) (Oral)  Resp 15  Wt 233 lb 2 oz (105.745 kg)  SpO2 99%     Wt Readings from Last 3 Encounters:  12/23/11 233 lb 2 oz (105.745 kg)  12/07/11 246 lb 4 oz (111.698 kg)  08/18/11 241 lb (109.317 kg)    Constitutional: She is overweight. She appears well-developed and well-nourished. No distress.  Neck: thyroidectomy scar, mild soft tissue swelling without fluid or mass - Normal range of motion. Neck supple. No JVD present. Cardiovascular: Normal rate, regular rhythm and normal heart sounds.  No murmur heard. no BLE edema Pulmonary/Chest: Effort normal and breath sounds normal. No respiratory distress. She has no wheezes.  Musculoskeletal:  right shoulder arthroscopy site incision intact, healing well without signs of infection. Mild diffuse bruising along the right bicep - s/p L TKR - B knee with boggy synovitis - tender to palpation over joint line; FROM and ligamentous function intact - no gross effusions Neurological: She is alert and oriented to person, place, and time. No cranial nerve deficit. MAE well; Coordination and balance normal.   Lab Results  Component Value  Date   WBC 12.7* 06/21/2011   HGB 8.1* 06/21/2011   HCT 26.1* 06/21/2011   PLT 468* 06/21/2011   CHOL 128 06/11/2011   TRIG 228.0* 06/11/2011   HDL 50.00 06/11/2011   LDLDIRECT 56.4 06/11/2011   ALT 20 12/15/2011   AST 26 12/15/2011   NA 139 06/19/2011   K 3.1* 06/19/2011   CL 98 06/19/2011   CREATININE 0.74 06/19/2011   BUN 7 06/19/2011   CO2 28 06/19/2011   TSH 7.09* 12/09/2011   INR 2.21* 06/21/2011   HGBA1C 7.1* 12/09/2011   MICROALBUR 0.2 12/24/2008   Assessment & Plan:  See problem  list. Medications and labs reviewed today.  Acute blood loss anemia, related to recent surgical intervention. Recheck H&H now  Peripheral neuropathy, history of same related to diabetes. Add gabapentin. Also check B12 for other potential causes of neuropathy -suspect exacerbation of fibromyalgia contributing to increasing pain symptoms  Chills without fever. Nontoxic on exam. Check labs including UA -hold empiric antibiotics unless abnormal white count or evidence of infection identified

## 2011-12-23 NOTE — Patient Instructions (Signed)
It was good to see you today. Test(s) ordered today. Your results will be called to you after review (48-72hours after test completion). If any changes need to be made, you will be notified at that time. Start gabapentin for neuropathy ain - Your prescription(s) have been submitted to your pharmacy. Please take as directed and contact our office if you believe you are having problem(s) with the medication(s). Other Medications reviewed, no additional changes at this time.

## 2012-01-03 ENCOUNTER — Encounter: Payer: Self-pay | Admitting: Endocrinology

## 2012-01-03 ENCOUNTER — Ambulatory Visit (INDEPENDENT_AMBULATORY_CARE_PROVIDER_SITE_OTHER): Payer: Medicare Other | Admitting: Endocrinology

## 2012-01-03 VITALS — BP 118/72 | HR 84 | Temp 98.2°F | Resp 15 | Wt 242.6 lb

## 2012-01-03 DIAGNOSIS — E1065 Type 1 diabetes mellitus with hyperglycemia: Secondary | ICD-10-CM

## 2012-01-03 DIAGNOSIS — E1049 Type 1 diabetes mellitus with other diabetic neurological complication: Secondary | ICD-10-CM

## 2012-01-03 DIAGNOSIS — E1142 Type 2 diabetes mellitus with diabetic polyneuropathy: Secondary | ICD-10-CM

## 2012-01-03 DIAGNOSIS — E1149 Type 2 diabetes mellitus with other diabetic neurological complication: Secondary | ICD-10-CM

## 2012-01-03 NOTE — Patient Instructions (Addendum)
Please increase the insulin to 35 units with breakfast, and 15 with the evening meal.   If your sugar is not better by next week, please call back.   on this type of insulin, you should eat lunch no later than 4 hrs after breakfast.     check your blood sugar twice a day.  vary the time of day when you check, between before the 3 meals, and at bedtime.  also check if you have symptoms of your blood sugar being too high or too low.  please keep a record of the readings and bring it to your next appointment here.  please call us sooner if your blood sugar goes below 70, or if it stays over 200.

## 2012-01-03 NOTE — Progress Notes (Signed)
  Subjective:    Patient ID: Brittany Acosta, female    DOB: 1950-07-04, 61 y.o.   MRN: [id number]  HPI Pt returns for f/u of insulin-requiring DM (dx'ed 2002, complicated by peripheral sensory neuropathy).  no cbg record, but states cbg's have gone up to the 200's.  She is not sure if this is due to the neurontin, discontinuation of the metformin, or some other reason.     Review of Systems denies hypoglycemia    Objective:   Physical Exam VITAL SIGNS:  See vs page GENERAL: no distress PSYCH: Alert and oriented x 3.  Does not appear anxious nor depressed.  Lab Results  Component Value Date   HGBA1C 7.3* 12/23/2011      Assessment & Plan:

## 2012-01-05 ENCOUNTER — Ambulatory Visit (INDEPENDENT_AMBULATORY_CARE_PROVIDER_SITE_OTHER): Payer: Medicare Other | Admitting: Internal Medicine

## 2012-01-05 ENCOUNTER — Telehealth: Payer: Self-pay

## 2012-01-05 ENCOUNTER — Encounter: Payer: Self-pay | Admitting: Internal Medicine

## 2012-01-05 VITALS — BP 134/72 | HR 90 | Temp 97.5°F | Ht 63.5 in | Wt 245.0 lb

## 2012-01-05 DIAGNOSIS — M25569 Pain in unspecified knee: Secondary | ICD-10-CM

## 2012-01-05 DIAGNOSIS — M25561 Pain in right knee: Secondary | ICD-10-CM

## 2012-01-05 DIAGNOSIS — E1142 Type 2 diabetes mellitus with diabetic polyneuropathy: Secondary | ICD-10-CM

## 2012-01-05 DIAGNOSIS — E1049 Type 1 diabetes mellitus with other diabetic neurological complication: Secondary | ICD-10-CM

## 2012-01-05 DIAGNOSIS — Z23 Encounter for immunization: Secondary | ICD-10-CM

## 2012-01-05 DIAGNOSIS — IMO0001 Reserved for inherently not codable concepts without codable children: Secondary | ICD-10-CM

## 2012-01-05 DIAGNOSIS — E1065 Type 1 diabetes mellitus with hyperglycemia: Secondary | ICD-10-CM

## 2012-01-05 DIAGNOSIS — E1149 Type 2 diabetes mellitus with other diabetic neurological complication: Secondary | ICD-10-CM

## 2012-01-05 MED ORDER — GABAPENTIN 300 MG PO CAPS: 600.0000 mg | ORAL_CAPSULE | Freq: Three times a day (TID) | ORAL | Status: DC

## 2012-01-05 NOTE — Telephone Encounter (Signed)
[facility] Triage Call Report Triage Record Num: [id number] Operator: Maryfrances Bunnell Patient Name: Brittany Acosta Call Date & Time: 01/04/2012 11:50:01PM Patient Phone: [phone] PCP: Rene Paci Patient Gender: Female PCP Fax : [phone] Patient DOB: 05-18-1950 Practice Name: [facility] Reason for Call: Caller: Belinda Fisher; PCP: Rene Paci (Adults only); CB#: [phone]; Call regarding Neuropathy; Pain in legs tonight, using Neurontin which has been helping up until tonight. Per Numbness or Tingling Protocol all emergent symptoms ruled out with exception of "Any numbness/tingling that persists despite home care measures for past 24hrs." Advised to see provider within 72 hours. Home care advice given. Protocol(s) Used: Leg Non-Injury Protocol(s) Used: Numbness or Tingling Recommended Outcome per Protocol: See Provider within 72 Hours Reason for Outcome: Gradual onset or worsening numbness/tingling Any numbness/tingling (not related to trauma or prolonged positioning) that persists despite home care measures for past 24 hours. Care Advice: ~ Avoid injury to affected area. Maintain good posture. Avoid putting pressure on a nerve by not carrying heavy objects such as computer case or backpack. Avoid overuse activities - alternate activities by switching sides and limit length of activity. Avoid lifting heavy objects. ~ ~ If numbness/tingling, weakness or pain worsens, see provider in 24 hours. Continue to follow your treatment plan. Take all medications as prescribed and keep all appointments with your provider. ~ ~ HEALTH PROMOTION / MAINTENANCE Call provider if numbness/tingling becomes worse when you walk, or is accompanied by other symptoms such as dizziness or muscle spasm. ~ ~ SYMPTOM / CONDITION MANAGEMENT ~ CAUTIONS 01/04/2012 11:59:09PM Page 1 of 1 [id number]

## 2012-01-05 NOTE — Telephone Encounter (Signed)
Caller: [name]/Patient; Patient Name: Brittany Acosta; PCP: Rene Paci (Adults only); Best Callback Phone Number: [phone]; Calling reports neuropathy below knees and feet  worsened last night,  has been using Neurontin for last 10 days, better till 910/1/13, took one extra pill to sleep, states blood sugars still in the 20 range, follows a fall 01/04/12 after being pulled by dog, fell flat on face, calling for appointment after triage 01/04/12. Same completed for 1100 01/05/12 with Dr Felicity Coyer.

## 2012-01-05 NOTE — Assessment & Plan Note (Signed)
Diffuse ache continue gabapentin with NSAIDs - suspect exacerbation of fibromyalgia contributing to increasing pain symptoms

## 2012-01-05 NOTE — Patient Instructions (Signed)
It was good to see you today. Increase dose of gabapentin for neuropathy and fibromyalgia pain - Your prescription(s) have been submitted to your pharmacy. Please take as directed and contact our office if you believe you are having problem(s) with the medication(s). Other Medications reviewed, no additional changes at this time. If new pain is unimproved, call your orthopedist or here for consideration of injection as needed Flu shot given today

## 2012-01-05 NOTE — Progress Notes (Signed)
Subjective:    Patient ID: Brittany Acosta, female    DOB: 19-May-1950, 61 y.o.   MRN: [id number]  Leg Pain    See CC -  Increase in chronic fibromyalgia aches and pains following accidental fall onto R knee - started gabapentin 12/2011 - initially improved until fall chronic burning of bilateral feet related to DM neuropathy   also reviewed chronic medical issues:  DM2 - follows with endo for same. reports compliance with ongoing medical treatment and no changes in medication dose or frequency. denies adverse side effects related to current therapy.  home cbg log reviewed - no hypoglycemia events - associated with neuropathy - working with podiatry for same  HTN - reports compliance with ongoing medical treatment and no changes in medication dose or frequency. denies adverse side effects related to current therapy.   dyslipidemia- on statin, reports compliance with ongoing medical treatment and no changes in medication dose or frequency. denies adverse side effects related to current therapy.   hypothyroid - s/p L partial thyroidectomy January 2013 procedure for goiter with mass effect -reports compliance with ongoing medical treatment and no changes in medication dose or frequency. denies adverse side effects related to current therapy.   Past Medical History  Diagnosis Date  . GOITER, MULTINODULAR   . HYPOTHYROIDISM   . HYPERCHOLESTEROLEMIA   . Morbid obesity   . BIPOLAR DISORDER UNSPECIFIED     depression  . Chronic diastolic heart failure   . ALLERGIC RHINITIS   . APHTHOUS STOMATITIS   . TRANSAMINASES, SERUM, ELEVATED   . ASYMPTOMATIC POSTMENOPAUSAL STATUS   . FIBROMYALGIA   . IDDM     Type  2 DM x 10 years  . Chronic kidney disease     kidney stones; sees urologist @ [facility]  . Arthritis     rheumatoid arthritis knees and shoulders  . GERD (gastroesophageal reflux disease)   . History of wrist fracture     rt wrist    Review of Systems  Constitutional: Negative for fever  and unexpected weight change.  HENT: Negative for facial swelling and neck pain.   Respiratory: Negative for cough.   Cardiovascular: Negative for chest pain and leg swelling.  Neurological: Negative for dizziness and headaches.      Objective:   Physical Exam  BP 134/72  Pulse 90  Temp 97.5 F (36.4 C) (Oral)  Ht 5' 3.5" (1.613 m)  Wt 245 lb (111.131 kg)  BMI 42.72 kg/m2  SpO2 97%     Wt Readings from Last 3 Encounters:  01/05/12 245 lb (111.131 kg)  01/03/12 242 lb 9 oz (110.026 kg)  12/23/11 233 lb 2 oz (105.745 kg)    Constitutional: She is overweight. She appears well-developed and well-nourished. No distress.  Neck: thyroidectomy scar, mild soft tissue swelling without fluid or mass - Normal range of motion. Neck supple. No JVD present. Cardiovascular: Normal rate, regular rhythm and normal heart sounds.  No murmur heard. no BLE edema Pulmonary/Chest: Effort normal and breath sounds normal. No respiratory distress. She has no wheezes.  Musculoskeletal:  R knee with warmth and boggy synovitis - mild bruising anterior, mildly tender to palpation over joint line;s/p L TKR - FROM and ligamentous function intact - no gross effusions Neurological: She is alert and oriented to person, place, and time. No cranial nerve deficit. MAE well; Coordination and balance normal.   Lab Results  Component Value Date   WBC 12.8* 12/23/2011   HGB 8.4* 12/23/2011   HCT  29.1* 12/23/2011   PLT 607* 12/23/2011   CHOL 128 06/11/2011   TRIG 228.0* 06/11/2011   HDL 50.00 06/11/2011   LDLDIRECT 56.4 06/11/2011   ALT 20 12/15/2011   AST 26 12/15/2011   NA 136 12/23/2011   K 3.5 12/23/2011   CL 96 12/23/2011   CREATININE 1.0 12/23/2011   BUN 14 12/23/2011   CO2 29 12/23/2011   TSH 7.09* 12/09/2011   INR 2.21* 06/21/2011   HGBA1C 7.3* 12/23/2011   MICROALBUR 0.2 12/24/2008   Assessment & Plan:  See problem list. Medications and labs reviewed today.  Accidental fall - mild R knee contusion from direct trauma.  No effusion today. 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

## 2012-01-05 NOTE — Assessment & Plan Note (Signed)
Reports increasing peripheral neuropathy symptoms Follows with podiatry and endo for same -  on gabapentin since 9/12 in addition to elavil  - titrate up now No ASA on coumadin, but on statin meds managed by endo - 70/30 only, metformin stopped 12/2011  Lab Results  Component Value Date   HGBA1C 7.3* 12/23/2011

## 2012-01-05 NOTE — Addendum Note (Signed)
Addended by: Carin Primrose on: 01/05/2012 04:49 PM   Modules accepted: Orders

## 2012-01-06 ENCOUNTER — Other Ambulatory Visit: Payer: Self-pay | Admitting: General Practice

## 2012-01-10 ENCOUNTER — Other Ambulatory Visit: Payer: Self-pay | Admitting: Internal Medicine

## 2012-01-17 ENCOUNTER — Telehealth: Payer: Self-pay | Admitting: Endocrinology

## 2012-01-17 ENCOUNTER — Telehealth: Payer: Self-pay

## 2012-01-17 NOTE — Telephone Encounter (Signed)
Pt advised that there is no documentation on a knee brace being ordered by [facility] or [facility]. Pt advised to return to sender since equipment not applicable. Pt understood and agreed.

## 2012-01-17 NOTE — Telephone Encounter (Signed)
Dr. Everardo All spoke with Dr. Felicity Coyer in regards to this Pt. Paperwork was sent to our office several times upon speaking with Dr. Felicity Coyer we completed said paperwork. Can someone in your office call Pt and confirm why knee brace was ordered and directions for use? Thanks.

## 2012-01-17 NOTE — Telephone Encounter (Signed)
Pt was sent a knee brace and isn't sure why. Her last appt was with Dr. Felicity Coyer for right knee pain, so informed pt that's probably why she received knee brace. She states that Dr. Diamantina Monks office told her they didn't send it. Can we look into her records and find out why she received this and whether or not she needs to be wearing it? Call pt at home #.

## 2012-01-20 ENCOUNTER — Other Ambulatory Visit: Payer: Self-pay

## 2012-01-20 MED ORDER — ESTROGENS CONJUGATED 0.3 MG PO TABS: ORAL_TABLET | ORAL | Status: DC

## 2012-01-21 ENCOUNTER — Telehealth: Payer: Self-pay | Admitting: Internal Medicine

## 2012-01-21 ENCOUNTER — Emergency Department (HOSPITAL_COMMUNITY): Payer: Medicare Other

## 2012-01-21 ENCOUNTER — Encounter: Payer: Self-pay | Admitting: Internal Medicine

## 2012-01-21 ENCOUNTER — Encounter (HOSPITAL_COMMUNITY): Payer: Self-pay | Admitting: *Deleted

## 2012-01-21 ENCOUNTER — Emergency Department (HOSPITAL_COMMUNITY)
Admission: EM | Admit: 2012-01-21 | Discharge: 2012-01-21 | Disposition: A | Discharge status: Home / Self Care | Payer: Medicare Other | Attending: Emergency Medicine | Admitting: Emergency Medicine

## 2012-01-21 ENCOUNTER — Ambulatory Visit (INDEPENDENT_AMBULATORY_CARE_PROVIDER_SITE_OTHER): Payer: Medicare Other | Admitting: Internal Medicine

## 2012-01-21 VITALS — BP 140/64 | HR 110 | Temp 97.6°F | Resp 20 | Wt 240.2 lb

## 2012-01-21 DIAGNOSIS — E860 Dehydration: Secondary | ICD-10-CM

## 2012-01-21 DIAGNOSIS — N189 Chronic kidney disease, unspecified: Secondary | ICD-10-CM | POA: Insufficient documentation

## 2012-01-21 DIAGNOSIS — F319 Bipolar disorder, unspecified: Secondary | ICD-10-CM | POA: Insufficient documentation

## 2012-01-21 DIAGNOSIS — Z794 Long term (current) use of insulin: Secondary | ICD-10-CM | POA: Insufficient documentation

## 2012-01-21 DIAGNOSIS — R112 Nausea with vomiting, unspecified: Secondary | ICD-10-CM | POA: Insufficient documentation

## 2012-01-21 DIAGNOSIS — R109 Unspecified abdominal pain: Secondary | ICD-10-CM | POA: Insufficient documentation

## 2012-01-21 DIAGNOSIS — Z9089 Acquired absence of other organs: Secondary | ICD-10-CM | POA: Insufficient documentation

## 2012-01-21 DIAGNOSIS — R197 Diarrhea, unspecified: Secondary | ICD-10-CM | POA: Insufficient documentation

## 2012-01-21 DIAGNOSIS — Z79899 Other long term (current) drug therapy: Secondary | ICD-10-CM | POA: Insufficient documentation

## 2012-01-21 DIAGNOSIS — E119 Type 2 diabetes mellitus without complications: Secondary | ICD-10-CM | POA: Insufficient documentation

## 2012-01-21 LAB — CBC WITH DIFFERENTIAL/PLATELET
Basophils Absolute: 0 10*3/uL (ref 0.0–0.1)
Basophils Relative: 0 % (ref 0–1)
Eosinophils Absolute: 0.5 10*3/uL (ref 0.0–0.7)
Eosinophils Relative: 5 % (ref 0–5)
HCT: 30.3 % — ABNORMAL LOW (ref 36.0–46.0)
Hemoglobin: 8.9 g/dL — ABNORMAL LOW (ref 12.0–15.0)
Lymphocytes Relative: 36 % (ref 12–46)
Lymphs Abs: 3.7 10*3/uL (ref 0.7–4.0)
MCH: 19.5 pg — ABNORMAL LOW (ref 26.0–34.0)
MCHC: 29.4 g/dL — ABNORMAL LOW (ref 30.0–36.0)
MCV: 66.3 fL — ABNORMAL LOW (ref 78.0–100.0)
Monocytes Absolute: 0.8 10*3/uL (ref 0.1–1.0)
Monocytes Relative: 8 % (ref 3–12)
Neutro Abs: 5.4 10*3/uL (ref 1.7–7.7)
Neutrophils Relative %: 51 % (ref 43–77)
Platelets: 552 10*3/uL — ABNORMAL HIGH (ref 150–400)
RBC: 4.57 MIL/uL (ref 3.87–5.11)
RDW: 26.2 % — ABNORMAL HIGH (ref 11.5–15.5)
WBC: 10.4 10*3/uL (ref 4.0–10.5)

## 2012-01-21 LAB — COMPREHENSIVE METABOLIC PANEL
ALT: 22 U/L (ref 0–35)
AST: 23 U/L (ref 0–37)
Albumin: 3.4 g/dL — ABNORMAL LOW (ref 3.5–5.2)
Alkaline Phosphatase: 124 U/L — ABNORMAL HIGH (ref 39–117)
BUN: 12 mg/dL (ref 6–23)
CO2: 26 mEq/L (ref 19–32)
Calcium: 8.5 mg/dL (ref 8.4–10.5)
Chloride: 101 mEq/L (ref 96–112)
Creatinine, Ser: 0.82 mg/dL (ref 0.50–1.10)
GFR calc Af Amer: 88 mL/min — ABNORMAL LOW (ref >90)
GFR calc non Af Amer: 76 mL/min — ABNORMAL LOW (ref >90)
Glucose, Bld: 70 mg/dL (ref 70–99)
Potassium: 3.2 mEq/L — ABNORMAL LOW (ref 3.5–5.1)
Sodium: 138 mEq/L (ref 135–145)
Total Bilirubin: 0.1 mg/dL — ABNORMAL LOW (ref 0.3–1.2)
Total Protein: 6.8 g/dL (ref 6.0–8.3)

## 2012-01-21 LAB — URINALYSIS, ROUTINE W REFLEX MICROSCOPIC
Bilirubin Urine: NEGATIVE
Glucose, UA: NEGATIVE mg/dL
Hgb urine dipstick: NEGATIVE
Ketones, ur: NEGATIVE mg/dL
Nitrite: NEGATIVE
Protein, ur: NEGATIVE mg/dL
Specific Gravity, Urine: 1.023 (ref 1.005–1.030)
Urobilinogen, UA: 0.2 mg/dL (ref 0.0–1.0)
pH: 6.5 (ref 5.0–8.0)

## 2012-01-21 LAB — URINE MICROSCOPIC-ADD ON

## 2012-01-21 MED ORDER — MORPHINE SULFATE 4 MG/ML IJ SOLN
6.0000 mg | Freq: Once | INTRAMUSCULAR | Status: AC
  Administered 2012-01-21: 6 mg via INTRAVENOUS
  Filled 2012-01-21: qty 2

## 2012-01-21 MED ORDER — ONDANSETRON HCL 4 MG/2ML IJ SOLN
4.0000 mg | Freq: Once | INTRAMUSCULAR | Status: AC
  Administered 2012-01-21: 4 mg via INTRAVENOUS
  Filled 2012-01-21: qty 2

## 2012-01-21 MED ORDER — ONDANSETRON HCL 4 MG PO TABS: 4.0000 mg | ORAL_TABLET | Freq: Four times a day (QID) | ORAL | Status: DC

## 2012-01-21 MED ORDER — SODIUM CHLORIDE 0.9 % IV BOLUS (SEPSIS)
1000.0000 mL | Freq: Once | INTRAVENOUS | Status: AC
  Administered 2012-01-21: 1000 mL via INTRAVENOUS

## 2012-01-21 MED ORDER — IOHEXOL 300 MG/ML  SOLN
100.0000 mL | Freq: Once | INTRAMUSCULAR | Status: AC | PRN
  Administered 2012-01-21: 100 mL via INTRAVENOUS

## 2012-01-21 MED ORDER — POTASSIUM CHLORIDE CRYS ER 20 MEQ PO TBCR
40.0000 meq | EXTENDED_RELEASE_TABLET | Freq: Once | ORAL | Status: AC
  Administered 2012-01-21: 40 meq via ORAL
  Filled 2012-01-21: qty 2

## 2012-01-21 NOTE — ED Notes (Signed)
Patient stated she is unable to give a urine sample at this time. Will try again later

## 2012-01-21 NOTE — ED Notes (Signed)
Pt aware of the need for a urine specimen,pt states "I will try after I drink this" (meaning CT contrast). Will check back.

## 2012-01-21 NOTE — Progress Notes (Signed)
Subjective:    Patient ID: Brittany Acosta, female    DOB: 05-Jan-1951, 61 y.o.   MRN: [id number]  Diarrhea  This is a new problem. The current episode started in the past 7 days. The problem occurs 5 to 10 times per day. The problem has been gradually worsening. The stool consistency is described as bloody and watery. The patient states that diarrhea awakens her from sleep. Associated symptoms include abdominal pain, chills, myalgias and vomiting. Pertinent negatives include no arthralgias, bloating, coughing, fever, headaches, increased  flatus, sweats, URI or weight loss. Nothing aggravates the symptoms. Risk factors include no known risk factors. She has tried nothing for the symptoms. The treatment provided no relief.      Review of Systems  Constitutional: Positive for chills, activity change, appetite change and fatigue. Negative for fever, weight loss, diaphoresis and unexpected weight change.  HENT: Negative.   Eyes: Negative.   Respiratory: Negative.  Negative for cough.   Cardiovascular: Negative.  Negative for chest pain, palpitations and leg swelling.  Gastrointestinal: Positive for nausea, vomiting, abdominal pain, diarrhea and blood in stool. Negative for constipation, abdominal distention, rectal pain, bloating and flatus.  Genitourinary: Negative.   Musculoskeletal: Positive for myalgias. Negative for back pain, joint swelling, arthralgias and gait problem.  Skin: Negative for color change, pallor, rash and wound.  Neurological: Positive for dizziness and light-headedness. Negative for tremors, seizures, syncope, facial asymmetry, speech difficulty, weakness, numbness and headaches.  Hematological: Negative for adenopathy. Does not bruise/bleed easily.  Psychiatric/Behavioral: Negative.        Objective:   Physical Exam  Vitals reviewed. Constitutional: She is oriented to person, place, and time. She appears well-developed and well-nourished. She appears lethargic.   Non-toxic appearance. She has a sickly appearance. She appears ill. No distress.  HENT:  Head: Normocephalic.  Mouth/Throat: Oropharynx is clear and moist. Mucous membranes are pale, dry and not cyanotic. No oropharyngeal exudate, posterior oropharyngeal edema, posterior oropharyngeal erythema or tonsillar abscesses.  Eyes: Conjunctivae normal are normal. Right eye exhibits no discharge. Left eye exhibits no discharge. No scleral icterus.  Neck: Normal range of motion. Neck supple. No JVD present. No tracheal deviation present. No thyromegaly present.  Cardiovascular: Normal rate, regular rhythm, normal heart sounds and intact distal pulses.  Exam reveals no gallop and no friction rub.   No murmur heard. Pulmonary/Chest: Effort normal and breath sounds normal. No stridor. No respiratory distress. She has no wheezes. She has no rales. She exhibits no tenderness.  Abdominal: Soft. Bowel sounds are normal. She exhibits no distension and no mass. There is no tenderness. There is no rebound and no guarding.  Genitourinary: Rectum normal. Rectal exam shows no external hemorrhoid, no internal hemorrhoid, no fissure, no mass, no tenderness and anal tone normal. Guaiac negative stool.  Musculoskeletal: Normal range of motion. She exhibits edema. She exhibits no tenderness.  Lymphadenopathy:    She has no cervical adenopathy.  Neurological: She is oriented to person, place, and time. She appears lethargic. She is not disoriented.  Skin: Skin is warm, dry and intact. No rash noted. She is not diaphoretic. No erythema. There is pallor.  Psychiatric: She has a normal mood and affect. Her behavior is normal. Judgment and thought content normal.      Lab Results  Component Value Date   WBC 12.8* 12/23/2011   HGB 8.4* 12/23/2011   HCT 29.1* 12/23/2011   PLT 607* 12/23/2011   GLUCOSE 117* 12/23/2011   CHOL 128 06/11/2011   TRIG  228.0* 06/11/2011   HDL 50.00 06/11/2011   LDLDIRECT 56.4 06/11/2011   LDLCALC 42  09/03/2008   ALT 20 12/15/2011   AST 26 12/15/2011   NA 136 12/23/2011   K 3.5 12/23/2011   CL 96 12/23/2011   CREATININE 1.0 12/23/2011   BUN 14 12/23/2011   CO2 29 12/23/2011   TSH 7.09* 12/09/2011   INR 2.21* 06/21/2011   HGBA1C 7.3* 12/23/2011   MICROALBUR 0.2 12/24/2008      Assessment & Plan:

## 2012-01-21 NOTE — ED Notes (Signed)
Pt sts headache, abdominal pain, n/v/d, and shoulder pain x 5 days.  Sts emesis has stopped.  Sts increasing abdominal pain.  Sts blood in stool x 1 occurrence on Monday.

## 2012-01-21 NOTE — Assessment & Plan Note (Signed)
She appears to be acutely ill with N/V/D and now dehydration, she has recent abnormal lab work and a complicated history, I have sent her to the ER for urgent evaluation and IV fluids.

## 2012-01-21 NOTE — ED Notes (Signed)
Pt attempted to provide [place] with a urine sample however "just can't go".

## 2012-01-21 NOTE — ED Notes (Addendum)
Pt states "this has been going on since Monday, thought I was getting better then last night it was awful, my doctor is out of town, also having stomach pain, feels rumbling, can't describe the pain"; pt now stating "they sent me here from the doctor's office for dehydration"

## 2012-01-21 NOTE — Telephone Encounter (Signed)
Caller: [name]/Patient; Patient Name: Brittany Acosta; PCP: Rene Paci (Adults only); Best Callback Phone Number: [phone]. Patient N/V/D onset on Monday  01/17/12.  She states Tuesday 01/18/15 she had diarrhea. Wednesday 01/19/12 she was fine, Thursday 01/20/12 fine and then diarrhea started back .  Diarrhea x every 1/2 hour through the night.  She states she is weak. Abdominal discomfort at umbilicus, intermittent dull pain.  Treating at home with Pepto-Bismol. GBS # 189. Last intake this morning water taking with meds.  Last UOP- this morning. Afebrile. she has been eating and drinking . Generalized weakness with bloating and discomfort.  Emergent s/sx ruled out per Diabetes: Gastrointestinal Problems Protocol with the exception to "Generalized bloating /diarrhea. See Provider in 24 hours . Appointment scheduled today 01/21/12, with Dr. Yetta Barre at 11:15. Care advice and call back parameters reviewed with patient. Understanding expressed.

## 2012-01-22 NOTE — ED Provider Notes (Signed)
History    61yF with abdominal pain and n/v/d. Onset about 4d ago. Feels achy all over, particularly shoulder. Nausea and NBNB emesis. Last vomited yesterday but still feels nauseated. 2-3 loose stools/day. No blood or melena. Mild diffuse crampy abdominal pain. No urinary complaints. No fever or chills. No sick contacts.  CSN: [id number]  Arrival date & time 01/21/12  1143   First MD Initiated Contact with Patient 01/21/12 1214      Chief Complaint  Patient presents with  . Abdominal Pain  . Emesis  . Diarrhea    (Consider location/radiation/quality/duration/timing/severity/associated sxs/prior treatment) HPI  Past Medical History  Diagnosis Date  . GOITER, MULTINODULAR   . HYPOTHYROIDISM   . HYPERCHOLESTEROLEMIA   . Morbid obesity   . BIPOLAR DISORDER UNSPECIFIED     depression  . Chronic diastolic heart failure   . ALLERGIC RHINITIS   . APHTHOUS STOMATITIS   . TRANSAMINASES, SERUM, ELEVATED   . ASYMPTOMATIC POSTMENOPAUSAL STATUS   . FIBROMYALGIA   . IDDM     Type  2 DM x 10 years  . Chronic kidney disease     kidney stones; sees urologist @ [facility]  . Arthritis     rheumatoid arthritis knees and shoulders  . GERD (gastroesophageal reflux disease)   . History of wrist fracture     rt wrist    Past Surgical History  Procedure Date  . Cholecystectomy 1985  . Abdominal hysterectomy 1976  . Tubal ligation 1972  . Left ovary removed 1980  . Right nasal surgery 08/1988  . Right sinus removed 08/1989    tooth partial  . Percitania stone removed (l) kidney 1992  . Lithotripsy (l) kidney 1997  . Left knee arthroscopy      Partial medical menisectomy  . Left cystoscopy  1990  . Right knee surgery  08/2009  . Breast surgery 2000    Breast bx- Left  . Cardiac catheterization 2001    sees Dr Peter [place]  . Thyroidectomy 04/22/2011    Procedure: THYROIDECTOMY;  Surgeon: Antony Contras, MD;  Location: [facility] OR;  Service: ENT;  Laterality: N/A;  TOTAL THYROIDECOTMY  .  Cataract extraction, bilateral 02/2011    epps  . Total knee arthroplasty 06/18/2011    Procedure: TOTAL KNEE ARTHROPLASTY;  Surgeon: Nestor Lewandowsky, MD;  Location: [facility] OR;  Service: Orthopedics;  Laterality: Left;  DEPUY    Family History  Problem Relation Age of Onset  . Hypertension Mother   . Diabetes Father   . Hypertension Father   . Hyperlipidemia Father   . Heart attack Other   . Coronary artery disease Other     History  Substance Use Topics  . Smoking status: Former Smoker -- 1.0 packs/day for 6 years    Types: Cigarettes    Quit date: 04/06/1983  . Smokeless tobacco: Never Used   Comment: Married, lives with spouse. Disable- 2 grown kids-6 g-kids  . Alcohol Use: No    OB History    Grav Para Term Preterm Abortions TAB SAB Ect Mult Living                  Review of Systems   Review of symptoms negative unless otherwise noted in HPI.   Allergies  Review of patient's allergies indicates no known allergies.  Home Medications   Current Outpatient Rx  Name Route Sig Dispense Refill  . [facility] ALCOHOL PREP SWABS 70 % PADS      . ALPHA-LIPOIC ACID  300 MG PO TABS Oral Take by mouth daily.    . ARIPIPRAZOLE 20 MG PO TABS Oral Take 20 mg by mouth at bedtime.     . ATORVASTATIN CALCIUM 20 MG PO TABS Oral Take 1 tablet (20 mg total) by mouth daily. 90 tablet 3  . BIOTIN 5000 MCG PO CAPS Oral Take 1 capsule by mouth daily.     Marland Kitchen CALCIUM 600 + D PO Oral Take 1 tablet by mouth 2 (two) times daily.      Marland Kitchen VITAMIN D 2000 UNITS PO CAPS Oral Take 1 capsule by mouth at bedtime.      Marland Kitchen CLONAZEPAM 1 MG PO TABS Oral Take 2 mg by mouth at bedtime.     . CO Q 10 60 MG PO CAPS Oral Take 1 capsule by mouth 3 (three) times daily.      Marland Kitchen ESTROGENS CONJUGATED 0.3 MG PO TABS Oral Take 0.3 mg by mouth daily.    . IRON 325 (65 FE) MG PO TABS Oral Take 1 tablet by mouth daily.    . FUROSEMIDE 40 MG PO TABS Oral Take 40 mg by mouth at bedtime.    Marland Kitchen GABAPENTIN 300 MG PO CAPS Oral Take 600 mg  by mouth 3 (three) times daily.    Marland Kitchen HYDROCHLOROTHIAZIDE 25 MG PO TABS Oral Take 25 mg by mouth every morning.     Marland Kitchen HYDROCODONE-ACETAMINOPHEN 7.5-325 MG PO TABS Oral Take 1 tablet by mouth every 6 (six) hours as needed.    . INDOMETHACIN ER 75 MG PO CPCR Oral Take 75 mg by mouth 4 (four) times daily as needed. For gout.    . INSULIN ASPART PROT & ASPART (70-30) 100 UNIT/ML [place] SUSP Subcutaneous Inject 15-35 Units into the skin. 35 units before breakfast, and 15 units before the evening meal, and pen needles 2/day.    . INSULIN PEN NEEDLE 31G X 5 MM MISC Other 1 each by Other route 2 (two) times daily.    Marland Kitchen ULTIMATE PROBIOTIC FORMULA PO Oral Take 1 capsule by mouth daily.     Marland Kitchen LAMOTRIGINE 200 MG PO TABS Oral Take 200 mg by mouth daily at 12 noon.     Marland Kitchen LEVOTHYROXINE SODIUM 50 MCG PO TABS Oral Take 50 mcg by mouth daily.    Marland Kitchen MAGNESIUM 100 MG PO TABS Oral Take 100 mg by mouth 3 (three) times daily.     Marland Kitchen MANGANESE GLUCONATE 50 MG PO TABS Oral Take 50 mg by mouth daily.     . MELOXICAM 15 MG PO TABS Oral Take 15 mg by mouth daily.    Marland Kitchen MIRTAZAPINE 30 MG PO TABS Oral Take 30 mg by mouth at bedtime.      Marland Kitchen ONE-DAILY MULTI VITAMINS PO TABS Oral Take 1 tablet by mouth daily.      Marland Kitchen NAPROXEN 500 MG PO TABS Oral Take 500 mg by mouth 2 (two) times daily with a meal.    . NON FORMULARY  Hair Revitalizing formula take 2 tabs daily    . OMEPRAZOLE 20 MG PO CPDR Oral Take 20 mg by mouth 2 (two) times daily.    . OXYBUTYNIN CHLORIDE ER 10 MG PO TB24 Oral Take 10 mg by mouth every evening.     Marland Kitchen POTASSIUM CITRATE ER 10 MEQ (1080 MG) PO TBCR Oral Take 20 mEq by mouth 2 (two) times daily.     . QUETIAPINE FUMARATE ER 150 MG PO TB24 Oral Take 150-450 mg by mouth  2 (two) times daily. Take 1 every morning and 3  At bedtime    . VITAMIN B-1 100 MG PO TABS Oral Take 100 mg by mouth daily.      . TOPIRAMATE 100 MG PO TABS Oral Take 100 mg by mouth 2 (two) times daily.     . VENLAFAXINE HCL ER 150 MG PO CP24 Oral Take  150 mg by mouth every morning.      Marland Kitchen GLUCOSE BLOOD VI STRP  test blood sugar 2 times daily dx 250.01, and lancets and alcohol preps, 2/day 200 each 3  . ONDANSETRON HCL 4 MG PO TABS Oral Take 1 tablet (4 mg total) by mouth every 6 (six) hours. 12 tablet 0    BP 114/54  Pulse 74  Temp 98.2 F (36.8 C) (Oral)  Resp 20  Wt 240 lb (108.863 kg)  SpO2 99%  Physical Exam  Nursing note and vitals reviewed. Constitutional: She appears well-developed and well-nourished. No distress.       Laying in bed. NAD. Obese.  HENT:  Head: Normocephalic and atraumatic.  Eyes: Conjunctivae normal are normal. Right eye exhibits no discharge. Left eye exhibits no discharge.  Neck: Neck supple.  Cardiovascular: Normal rate, regular rhythm and normal heart sounds.  Exam reveals no gallop and no friction rub.   No murmur heard. Pulmonary/Chest: Effort normal and breath sounds normal. No respiratory distress.  Abdominal: Soft. She exhibits no distension. There is tenderness. There is no rebound and no guarding.       Mild diffuse tenderness. No rebound or guarding.  Musculoskeletal: She exhibits no edema and no tenderness.  Neurological: She is alert.  Skin: Skin is warm and dry.  Psychiatric: She has a normal mood and affect. Her behavior is normal. Thought content normal.    ED Course  Procedures (including critical care time)  Labs Reviewed  CBC WITH DIFFERENTIAL - Abnormal; Notable for the following:    Hemoglobin 8.9 (*)     HCT 30.3 (*)     MCV 66.3 (*)     MCH 19.5 (*)     MCHC 29.4 (*)     RDW 26.2 (*)     Platelets 552 (*)     All other components within normal limits  COMPREHENSIVE METABOLIC PANEL - Abnormal; Notable for the following:    Potassium 3.2 (*)     Albumin 3.4 (*)     Alkaline Phosphatase 124 (*)     Total Bilirubin 0.1 (*)     GFR calc non Af Amer 76 (*)     GFR calc Af Amer 88 (*)     All other components within normal limits  URINALYSIS, ROUTINE W REFLEX MICROSCOPIC  - Abnormal; Notable for the following:    Leukocytes, UA SMALL (*)     All other components within normal limits  URINE MICROSCOPIC-ADD ON - Abnormal; Notable for the following:    Squamous Epithelial / LPF FEW (*)     Bacteria, UA FEW (*)     All other components within normal limits  LAB REPORT - SCANNED   Ct Abdomen Pelvis W Contrast  01/21/2012  *RADIOLOGY REPORT*  Clinical Data: abdominal pain, nausea, vomiting, diarrhea  CT ABDOMEN AND PELVIS WITH CONTRAST  Technique:  Multidetector CT imaging of the abdomen and pelvis was performed following the standard protocol during bolus administration of intravenous contrast.  Contrast: OMNIPAQUE IOHEXOL 300 MG/ML  SOLN  Comparison: 01/05/2006  Findings: Sagittal images of the spine  shows disc space flattening with vacuum disc phenomenon and mild anterior and posterior spurring at L5 S1 level.  Lung bases are unremarkable.  Enhanced liver is unremarkable.  The patient is status post cholecystectomy.  Spleen and adrenal glands are unremarkable.  Partially fatty replaced pancreas again noted without change from prior exam.  Enhanced kidneys are symmetrical in size.  No hydronephrosis or hydroureter.  There is a stable cyst in lower pole of the right kidney measures 9 mm.  Exophytic cyst in the lower pole of the left kidney measures 2.3 cm increased in size from prior exam.  Stable cyst in the upper pole of the left kidney measures 1.9 cm.  Again noted a retroaortic left renal vein.  There is nonspecific minimal stranding of the central mesenteric fat surrounding the root of the mesentery.  This is stable from prior exam.  A central mesenteric lymph node measures 1.3 x 0.8 cm without change from prior exam.  Delayed renal images shows bilateral renal symmetrical excretion. A mesenteric cyst measures five by 3.3 cm in axial image 38 in the right abdomen.  Stable in size and appearance from prior exam.  No small bowel obstruction.  No ascites or free air.   There is no pericecal inflammation. The patient is status post appendectomy.  The urinary bladder is unremarkable.  The patient is status post hysterectomy.  No distal colonic obstruction.  No inguinal adenopathy.  Mild degenerative changes SI joints.  Coronal images shows no destructive bony lesions within pelvis.  Bilateral visualized proximal ureter on delayed images is unremarkable.  IMPRESSION:  1.  No hydronephrosis or hydroureter.  Bilateral renal cysts. 2.  No small bowel obstruction. 3.  Stable mesenteric/peritoneal cyst in right abdomen. 4.  Status post cholecystectomy.  Status post appendectomy. 5.  No pericecal inflammation. 6.  Status post hysterectomy.   Original Report Authenticated By: Natasha Mead, M.D.      1. Nausea and vomiting   2. Diarrhea   3. Mild dehydration       MDM  61yF with abdominal pain. Suspect viral illness. Mild diffuse tenderness on exam w/o peritoneal signs. Imaging unremarkable. Labs fairly unremarkable aside from anemia which is at baseline (8.1-9.5) and thrombocytosis which is chronic as well. Clinically mildy dehydrated. Feeling better with ivf and meds. Tachycardia resolved. I feel safe for DC at this time. Return precautions discussed. PRN antiemetics. Outpt FU.        Raeford Razor, MD 01/22/12 [phone]

## 2012-01-26 ENCOUNTER — Telehealth: Payer: Self-pay | Admitting: Internal Medicine

## 2012-01-26 NOTE — Telephone Encounter (Signed)
The patient is hoping to get an rx for a walk in tub.  Does she need to schedule an ov to get this?

## 2012-01-27 NOTE — Telephone Encounter (Signed)
I do not prescribe this type of dme. No ov needed

## 2012-01-27 NOTE — Telephone Encounter (Signed)
Notified pt wit hmd response. Pt states she has appt already schedule for 02/03/12 will discuss then,....Brittany Acosta

## 2012-01-28 ENCOUNTER — Other Ambulatory Visit: Payer: Self-pay | Admitting: Orthopedic Surgery

## 2012-01-28 ENCOUNTER — Encounter (HOSPITAL_COMMUNITY): Payer: Self-pay | Admitting: Pharmacy Technician

## 2012-01-31 ENCOUNTER — Encounter: Payer: Self-pay | Admitting: Internal Medicine

## 2012-01-31 ENCOUNTER — Ambulatory Visit (INDEPENDENT_AMBULATORY_CARE_PROVIDER_SITE_OTHER): Payer: Medicare Other | Admitting: Internal Medicine

## 2012-01-31 VITALS — BP 132/62 | HR 97 | Temp 97.0°F | Ht 63.5 in | Wt 248.0 lb

## 2012-01-31 DIAGNOSIS — IMO0001 Reserved for inherently not codable concepts without codable children: Secondary | ICD-10-CM

## 2012-01-31 DIAGNOSIS — M1710 Unilateral primary osteoarthritis, unspecified knee: Secondary | ICD-10-CM

## 2012-01-31 DIAGNOSIS — E860 Dehydration: Secondary | ICD-10-CM

## 2012-01-31 DIAGNOSIS — M1712 Unilateral primary osteoarthritis, left knee: Secondary | ICD-10-CM

## 2012-01-31 DIAGNOSIS — IMO0002 Reserved for concepts with insufficient information to code with codable children: Secondary | ICD-10-CM

## 2012-01-31 NOTE — Assessment & Plan Note (Signed)
Diffuse ache  continue gabapentin with NSAIDs - suspect exacerbation of fibromyalgia contributing to osteoarthritis  pain symptoms

## 2012-01-31 NOTE — Patient Instructions (Signed)
It was good to see you today. We have reviewed your ER records including labs and tests today We've completed the paperwork for the [facility] application as requested today Continue to work with other specialists as ongoing Let us know if [facility] works with walk-in bathtub as I am unfamiliar with processing or ordering this equipment

## 2012-01-31 NOTE — Progress Notes (Signed)
  Subjective:    Patient ID: Brittany Acosta, female    DOB: Aug 19, 1950, 61 y.o.   MRN: [id number]  HPI  Here for [facility] paperwork - Also requesting approval for walk-in tub  Past Medical History  Diagnosis Date  . GOITER, MULTINODULAR   . HYPOTHYROIDISM   . HYPERCHOLESTEROLEMIA   . Morbid obesity   . BIPOLAR DISORDER UNSPECIFIED     depression  . Chronic diastolic heart failure   . ALLERGIC RHINITIS   . APHTHOUS STOMATITIS   . TRANSAMINASES, SERUM, ELEVATED   . ASYMPTOMATIC POSTMENOPAUSAL STATUS   . FIBROMYALGIA   . IDDM     Type  2 DM x 10 years  . Chronic kidney disease     kidney stones; sees urologist @ [facility]  . Arthritis     rheumatoid arthritis knees and shoulders  . GERD (gastroesophageal reflux disease)   . History of wrist fracture     rt wrist    Review of Systems  Constitutional: Negative for fever and fatigue.  Respiratory: Negative for cough and shortness of breath.   Cardiovascular: Negative for chest pain and palpitations.       Objective:   Physical Exam BP 132/62  Pulse 97  Temp 97 F (36.1 C) (Oral)  Ht 5' 3.5" (1.613 m)  Wt 248 lb (112.492 kg)  BMI 43.24 kg/m2  SpO2 98% Constitutional: She appears well-developed and well-nourished. No distress.  Neck: Normal range of motion. Neck supple. No JVD present. No thyromegaly present.  Cardiovascular: Normal rate, regular rhythm and normal heart sounds.  No murmur heard. No BLE edema. Pulmonary/Chest: Effort normal and breath sounds normal. No respiratory distress. She has no wheezes.  Musculoskeletal: Normal range of motion, no joint effusions. No gross deformities Neurological: She is alert and oriented to person, place, and time. No cranial nerve deficit. Coordination normal.  Skin: Skin is warm and dry. No rash noted. No erythema.  Psychiatric: She has a normal mood and affect. Her behavior is normal. Judgment and thought content normal.   Lab Results  Component Value Date   WBC 10.4 01/21/2012   HGB 8.9* 01/21/2012   HCT 30.3* 01/21/2012   PLT 552* 01/21/2012   GLUCOSE 70 01/21/2012   CHOL 128 06/11/2011   TRIG 228.0* 06/11/2011   HDL 50.00 06/11/2011   LDLDIRECT 56.4 06/11/2011   LDLCALC 42 09/03/2008   ALT 22 01/21/2012   AST 23 01/21/2012   NA 138 01/21/2012   K 3.2* 01/21/2012   CL 101 01/21/2012   CREATININE 0.82 01/21/2012   BUN 12 01/21/2012   CO2 26 01/21/2012   TSH 7.09* 12/09/2011   INR 2.21* 06/21/2011   HGBA1C 7.3* 12/23/2011   MICROALBUR 0.2 12/24/2008       Assessment & Plan:  See problem list. Medications and labs reviewed today.  Time spent with pt today 25 minutes, greater than 50% time spent counseling patient on paperwork for DMV re: diabetes, bipolar and medication review. Also review of ER records for dehydration  dehydration with viral gastroenteritis - resolved

## 2012-01-31 NOTE — Assessment & Plan Note (Signed)
S/p TKR 06/2011 - Planning R RTC repair 02/2012 Turner Daniels ?walk in tub - I am unfamiliar with this DME order - pt will talk with her [facility] agency and ortho

## 2012-02-03 ENCOUNTER — Ambulatory Visit: Payer: Medicare Other | Admitting: Internal Medicine

## 2012-02-03 ENCOUNTER — Ambulatory Visit (INDEPENDENT_AMBULATORY_CARE_PROVIDER_SITE_OTHER): Payer: Medicare Other | Admitting: Endocrinology

## 2012-02-03 ENCOUNTER — Other Ambulatory Visit (HOSPITAL_COMMUNITY): Payer: Medicare Other

## 2012-02-03 ENCOUNTER — Encounter: Payer: Self-pay | Admitting: Endocrinology

## 2012-02-03 VITALS — BP 130/70 | HR 104 | Temp 97.5°F | Wt 245.0 lb

## 2012-02-03 DIAGNOSIS — E1049 Type 1 diabetes mellitus with other diabetic neurological complication: Secondary | ICD-10-CM

## 2012-02-03 DIAGNOSIS — E1065 Type 1 diabetes mellitus with hyperglycemia: Secondary | ICD-10-CM

## 2012-02-03 NOTE — Patient Instructions (Addendum)
Please increase the insulin to 35 units with breakfast, and 25 with the evening meal.   Please come back for a follow-up appointment in 3 months.   on this type of insulin, you should eat lunch no later than 4 hrs after breakfast.     check your blood sugar twice a day.  vary the time of day when you check, between before the 3 meals, and at bedtime.  also check if you have symptoms of your blood sugar being too high or too low.  please keep a record of the readings and bring it to your next appointment here.  please call us sooner if your blood sugar goes below 70, or if it stays over 200.

## 2012-02-03 NOTE — Progress Notes (Signed)
Subjective:    Patient ID: Brittany Acosta, female    DOB: 08-27-1950, 61 y.o.   MRN: [id number]  Diabetes  Pt returns for f/u of insulin-requiring DM (dx'ed 2002, complicated by peripheral sensory neuropathy).  she brings a record of her cbg's which i have reviewed today. It varies from 100-200's.  It is in general lowest in the afternoon, but not necessarily so.    Past Medical History  Diagnosis Date  . GOITER, MULTINODULAR   . HYPOTHYROIDISM   . HYPERCHOLESTEROLEMIA   . Morbid obesity   . BIPOLAR DISORDER UNSPECIFIED     depression  . Chronic diastolic heart failure   . ALLERGIC RHINITIS   . APHTHOUS STOMATITIS   . TRANSAMINASES, SERUM, ELEVATED   . ASYMPTOMATIC POSTMENOPAUSAL STATUS   . FIBROMYALGIA   . IDDM     Type  2 DM x 10 years  . Chronic kidney disease     kidney stones; sees urologist @ [facility]  . Arthritis     rheumatoid arthritis knees and shoulders  . GERD (gastroesophageal reflux disease)   . History of wrist fracture     rt wrist  . Depression   . PONV (postoperative nausea and vomiting)   . Anemia     iron deficiency  . Neuropathy due to secondary diabetes     Past Surgical History  Procedure Date  . Cholecystectomy 1985  . Abdominal hysterectomy 1976  . Tubal ligation 1972  . Left ovary removed 1980  . Right nasal surgery 08/1988  . Right sinus removed 08/1989    tooth partial  . Percitania stone removed (l) kidney 1992  . Lithotripsy (l) kidney 1997  . Left knee arthroscopy      Partial medical menisectomy  . Left cystoscopy  1990  . Right knee surgery  08/2009  . Breast surgery 2000    Breast bx- Left  . Cardiac catheterization 2001    sees Dr Peter [place]  . Thyroidectomy 04/22/2011    Procedure: THYROIDECTOMY;  Surgeon: Antony Contras, MD;  Location: [facility];  Service: ENT;  Laterality: N/A;  TOTAL THYROIDECOTMY  . Cataract extraction, bilateral 02/2011    epps  . Total knee arthroplasty 06/18/2011    Procedure: TOTAL KNEE ARTHROPLASTY;   Surgeon: Nestor Lewandowsky, MD;  Location: [facility] OR;  Service: Orthopedics;  Laterality: Left;  DEPUY    History   Social History  . Marital Status: Married    Spouse Name: N/A    Number of Children: 2  . Years of Education: N/A   Occupational History  . disability    Social History Main Topics  . Smoking status: Former Smoker -- 1.0 packs/day for 6 years    Types: Cigarettes    Quit date: 04/06/1983  . Smokeless tobacco: Never Used   Comment: Married, lives with spouse. Disable- 2 grown kids-6 g-kids  . Alcohol Use: No  . Drug Use: No  . Sexually Active: No   Other Topics Concern  . Not on file   Social History Narrative   Married, lives with spouse - 2 adult childrendiabled    Current Outpatient Prescriptions on File Prior to Visit  Medication Sig Dispense Refill  . Alpha-Lipoic Acid 300 MG TABS Take 300 mg by mouth daily.       . ARIPiprazole (ABILIFY) 20 MG tablet Take 20 mg by mouth at bedtime.       Marland Kitchen atorvastatin (LIPITOR) 20 MG tablet Take 1 tablet (20 mg total) by  mouth daily.  90 tablet  3  . Biotin 10 MG TABS Take by mouth daily.      . Cholecalciferol (VITAMIN D3) 400 UNITS CAPS Take by mouth daily.      . clonazePAM (KLONOPIN) 1 MG tablet Take 2 mg by mouth at bedtime.       . Coenzyme Q10 (CO Q 10) 60 MG CAPS Take 120 mg by mouth daily.       Marland Kitchen estrogens, conjugated, (PREMARIN) 0.3 MG tablet Take 0.3 mg by mouth daily.      . Ferrous Sulfate (IRON) 325 (65 FE) MG TABS Take 325 mg by mouth every evening.       . furosemide (LASIX) 40 MG tablet Take 40 mg by mouth every evening.       . gabapentin (NEURONTIN) 300 MG capsule Take 600 mg by mouth 3 (three) times daily.      Marland Kitchen glucose blood (ONE TOUCH ULTRA TEST) test strip test blood sugar 2 times daily dx 250.01, and lancets and alcohol preps, 2/day  200 each  3  . hydrochlorothiazide 25 MG tablet Take 25 mg by mouth every morning.       . insulin aspart protamine-insulin aspart (NOVOLOG 70/30) (70-30) 100 UNIT/ML  injection Inject 15-35 Units into the skin 2 (two) times daily before lunch and supper. 35 units before breakfast, and 25 units before the evening meal      . Insulin Pen Needle 31G X 5 MM MISC 1 each by Other route 2 (two) times daily.      . Lactobacillus (ULTIMATE PROBIOTIC FORMULA PO) Take 1 capsule by mouth every evening.       . lamoTRIgine (LAMICTAL) 200 MG tablet Take 200 mg by mouth daily at 12 noon.       Marland Kitchen levothyroxine (SYNTHROID, LEVOTHROID) 50 MCG tablet Take 50 mcg by mouth daily.      . Magnesium 250 MG TABS Take by mouth 2 (two) times daily.      . Manganese Gluconate 50 MG TABS Take 50 mg by mouth every evening.       . meloxicam (MOBIC) 15 MG tablet Take 15 mg by mouth every evening.       . mirtazapine (REMERON) 30 MG tablet Take 30 mg by mouth at bedtime.        . Multiple Vitamin (MULTIVITAMIN WITH MINERALS) TABS Take 1 tablet by mouth every evening.      . naproxen (NAPROSYN) 500 MG tablet Take 500 mg by mouth 2 (two) times daily with a meal.      . NON FORMULARY Hair Revitalizing formula take 2 tabs daily      . omeprazole (PRILOSEC) 20 MG capsule Take 20 mg by mouth 2 (two) times daily.      Marland Kitchen oxybutynin (DITROPAN-XL) 10 MG 24 hr tablet Take 10 mg by mouth every evening.       . potassium citrate (UROCIT-K 10) 10 MEQ (1080 MG) SR tablet Take 20 mEq by mouth 2 (two) times daily.       . QUEtiapine Fumarate (SEROQUEL XR) 150 MG TB24 Take 150-450 mg by mouth 2 (two) times daily. Take 1 every morning and 3  At bedtime      . Thiamine HCl (VITAMIN B-1) 100 MG tablet Take 100 mg by mouth every evening.       . topiramate (TOPAMAX) 100 MG tablet Take 100 mg by mouth 2 (two) times daily.       Marland Kitchen  venlafaxine (EFFEXOR-XR) 150 MG 24 hr capsule Take 150 mg by mouth every morning.          No Known Allergies  Family History  Problem Relation Age of Onset  . Hypertension Mother   . Diabetes Father   . Hypertension Father   . Hyperlipidemia Father   . Heart attack Other   .  Coronary artery disease Other     BP 130/70  Pulse 104  Temp 97.5 F (36.4 C) (Oral)  Wt 245 lb (111.131 kg)  SpO2 94%  Review of Systems denies hypoglycemia.      Objective:   Physical Exam VITAL SIGNS:  See vs page.  GENERAL: no distress.   PSYCH: Alert and oriented x 3.  Does not appear anxious nor depressed.       Assessment & Plan:  DM, needs increased rx

## 2012-02-04 ENCOUNTER — Encounter (HOSPITAL_COMMUNITY)
Admission: RE | Admit: 2012-02-04 | Discharge: 2012-02-04 | Disposition: A | Discharge status: Home / Self Care | Payer: Medicare Other | Source: Ambulatory Visit | Attending: Orthopedic Surgery | Admitting: Orthopedic Surgery

## 2012-02-04 ENCOUNTER — Encounter (HOSPITAL_COMMUNITY): Payer: Self-pay

## 2012-02-04 HISTORY — DX: Other specified diabetes mellitus with diabetic neuropathy, unspecified: E13.40

## 2012-02-04 LAB — BASIC METABOLIC PANEL
BUN: 17 mg/dL (ref 6–23)
CO2: 28 mEq/L (ref 19–32)
Calcium: 9 mg/dL (ref 8.4–10.5)
Chloride: 104 mEq/L (ref 96–112)
Creatinine, Ser: 0.79 mg/dL (ref 0.50–1.10)
GFR calc Af Amer: >90 mL/min (ref >90)
GFR calc non Af Amer: 88 mL/min — ABNORMAL LOW (ref >90)
Glucose, Bld: 71 mg/dL (ref 70–99)
Potassium: 3.3 mEq/L — ABNORMAL LOW (ref 3.5–5.1)
Sodium: 141 mEq/L (ref 135–145)

## 2012-02-04 LAB — CBC WITH DIFFERENTIAL/PLATELET
Basophils Absolute: 0.1 10*3/uL (ref 0.0–0.1)
Basophils Relative: 1 % (ref 0–1)
Eosinophils Absolute: 0.5 10*3/uL (ref 0.0–0.7)
Eosinophils Relative: 6 % — ABNORMAL HIGH (ref 0–5)
HCT: 33.5 % — ABNORMAL LOW (ref 36.0–46.0)
Hemoglobin: 9.4 g/dL — ABNORMAL LOW (ref 12.0–15.0)
Lymphocytes Relative: 39 % (ref 12–46)
Lymphs Abs: 3.4 10*3/uL (ref 0.7–4.0)
MCH: 19.5 pg — ABNORMAL LOW (ref 26.0–34.0)
MCHC: 28.1 g/dL — ABNORMAL LOW (ref 30.0–36.0)
MCV: 69.4 fL — ABNORMAL LOW (ref 78.0–100.0)
Monocytes Absolute: 0.5 10*3/uL (ref 0.1–1.0)
Monocytes Relative: 6 % (ref 3–12)
Neutro Abs: 4.1 10*3/uL (ref 1.7–7.7)
Neutrophils Relative %: 48 % (ref 43–77)
Platelets: 523 10*3/uL — ABNORMAL HIGH (ref 150–400)
RBC: 4.83 MIL/uL (ref 3.87–5.11)
RDW: 26.4 % — ABNORMAL HIGH (ref 11.5–15.5)
WBC: 8.6 10*3/uL (ref 4.0–10.5)

## 2012-02-04 LAB — URINALYSIS, ROUTINE W REFLEX MICROSCOPIC
Bilirubin Urine: NEGATIVE
Glucose, UA: NEGATIVE mg/dL
Hgb urine dipstick: NEGATIVE
Ketones, ur: NEGATIVE mg/dL
Leukocytes, UA: NEGATIVE
Nitrite: NEGATIVE
Protein, ur: NEGATIVE mg/dL
Specific Gravity, Urine: 1.014 (ref 1.005–1.030)
Urobilinogen, UA: 0.2 mg/dL (ref 0.0–1.0)
pH: 7.5 (ref 5.0–8.0)

## 2012-02-04 LAB — TYPE AND SCREEN
ABO/RH(D): O POS
Antibody Screen: NEGATIVE

## 2012-02-04 LAB — PROTIME-INR
INR: 1.01 (ref 0.00–1.49)
Prothrombin Time: 13.2 seconds (ref 11.6–15.2)

## 2012-02-04 LAB — APTT: aPTT: 32 seconds (ref 24–37)

## 2012-02-04 LAB — SURGICAL PCR SCREEN
MRSA, PCR: NEGATIVE
Staphylococcus aureus: NEGATIVE

## 2012-02-04 NOTE — Pre-Procedure Instructions (Signed)
20 Brittany Acosta  02/04/2012   Your procedure is scheduled on:  Monday, November 4  Report to [facility] at 0845 AM.  Call this number if you have problems the morning of surgery: [phone]   Remember:   Do not eat food or drink liquids:After Midnight.     Take these medicines the morning of surgery with A SIP OF WATER: Premarin,Gabapentin,Synthroid,Omeprazole,Seroquel,Venlafexine,Topiramate   Do not wear jewelry, make-up or nail polish.  Do not wear lotions, powders, or perfumes. You may wear deodorant.  Do not shave 48 hours prior to surgery. Men may shave face and neck.  Do not bring valuables to the hospital.  Contacts, dentures or bridgework may not be worn into surgery.  Leave suitcase in the car. After surgery it may be brought to your room.  For patients admitted to the hospital, checkout time is 11:00 AM the day of discharge.   Special Instructions: Shower using CHG 2 nights before surgery and the night before surgery.  If you shower the day of surgery use CHG.  Use special wash - you have one bottle of CHG for all showers.  You should use approximately 1/3 of the bottle for each shower.   Please read over the following fact sheets that you were given: Pain Booklet, Coughing and Deep Breathing, Blood Transfusion Information, Total Joint Packet, MRSA Information and Surgical Site Infection Prevention

## 2012-02-04 NOTE — H&P (Signed)
TOTAL KNEE ADMISSION H&P  Patient is being admitted for right total knee arthroplasty.  Subjective:  Chief Complaint:right knee pain.  HPI: Brittany Acosta, 61 y.o. female, has a history of pain and functional disability in the right knee due to arthritis and has failed non-surgical conservative treatments for greater than 12 weeks to includeNSAID's and/or analgesics, corticosteriod injections and use of assistive devices.  Onset of symptoms was gradual, starting 5 years ago with gradually worsening course since that time. The patient noted no past surgery on the right knee(s).  Patient currently rates pain in the right knee(s) at 7 out of 10 with activity. Patient has night pain, pain that interferes with activities of daily living and pain with passive range of motion.  Patient has evidence of subchondral cysts, periarticular osteophytes and joint space narrowing by imaging studies. There is no active infection.  Patient Active Problem List   Diagnosis Date Noted  . Dehydration, moderate 01/21/2012  . 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 Osteoarthritis of left knee 06/21/2011  . GERD (gastroesophageal reflux disease) 02/09/2011  . Atypical chest pain 02/09/2011  . Unspecified vitamin D deficiency 08/27/2010  . FIBROMYALGIA 03/23/2010  . APHTHOUS STOMATITIS 05/23/2009  . GOITER, MULTINODULAR 03/24/2009  . CHRONIC DIASTOLIC HEART FAILURE 12/24/2008  . TRANSAMINASES, SERUM, ELEVATED 09/26/2008  . EDEMA 09/06/2008  . MORBID OBESITY 09/03/2008  . HYPOTHYROIDISM 06/10/2008  . HYPERCHOLESTEROLEMIA 06/10/2008  . BIPOLAR DISORDER UNSPECIFIED 06/10/2008  . HYPERTENSION 06/10/2008  . ALLERGIC RHINITIS 06/10/2008  . ASYMPTOMATIC POSTMENOPAUSAL STATUS 06/10/2008   Past Medical History  Diagnosis Date  . GOITER, MULTINODULAR   . HYPOTHYROIDISM   . HYPERCHOLESTEROLEMIA   . Morbid obesity   . BIPOLAR DISORDER UNSPECIFIED       depression  . Chronic diastolic heart failure   . ALLERGIC RHINITIS   . APHTHOUS STOMATITIS   . TRANSAMINASES, SERUM, ELEVATED   . ASYMPTOMATIC POSTMENOPAUSAL STATUS   . FIBROMYALGIA   . IDDM     Type  2 DM x 10 years  . Chronic kidney disease     kidney stones; sees urologist @ [facility]  . Arthritis     rheumatoid arthritis knees and shoulders  . GERD (gastroesophageal reflux disease)   . History of wrist fracture     rt wrist    Past Surgical History  Procedure Date  . Cholecystectomy 1985  . Abdominal hysterectomy 1976  . Tubal ligation 1972  . Left ovary removed 1980  . Right nasal surgery 08/1988  . Right sinus removed 08/1989    tooth partial  . Percitania stone removed (l) kidney 1992  . Lithotripsy (l) kidney 1997  . Left knee arthroscopy      Partial medical menisectomy  . Left cystoscopy  1990  . Right knee surgery  08/2009  . Breast surgery 2000    Breast bx- Left  . Cardiac catheterization 2001    sees Dr Peter [place]  . Thyroidectomy 04/22/2011    Procedure: THYROIDECTOMY;  Surgeon: Antony Contras, MD;  Location: [facility] OR;  Service: ENT;  Laterality: N/A;  TOTAL THYROIDECOTMY  . Cataract extraction, bilateral 02/2011    epps  . Total knee arthroplasty 06/18/2011    Procedure: TOTAL KNEE ARTHROPLASTY;  Surgeon: Nestor Lewandowsky, MD;  Location: [facility] OR;  Service: Orthopedics;  Laterality: Left;  DEPUY    No prescriptions prior to admission   No Known Allergies  History  Substance Use Topics  . Smoking  status: Former Smoker -- 1.0 packs/day for 6 years    Types: Cigarettes    Quit date: 04/06/1983  . Smokeless tobacco: Never Used   Comment: Married, lives with spouse. Disable- 2 grown kids-6 g-kids  . Alcohol Use: No    Family History  Problem Relation Age of Onset  . Hypertension Mother   . Diabetes Father   . Hypertension Father   . Hyperlipidemia Father   . Heart attack Other   . Coronary artery disease Other      Review of Systems  Constitutional:  Negative.   HENT: Negative.   Eyes: Negative.   Respiratory: Negative.   Cardiovascular: Negative.   Gastrointestinal: Negative.   Genitourinary: Negative.   Musculoskeletal: Positive for myalgias and joint pain.  Skin: Negative.   Neurological: Negative.   Endo/Heme/Allergies: Negative.   Psychiatric/Behavioral: Positive for depression. The patient is nervous/anxious.     Objective:  Physical Exam  Constitutional: She is oriented to person, place, and time. She appears well-developed and well-nourished.  HENT:  Head: Normocephalic.  Eyes: Pupils are equal, round, and reactive to light.  Cardiovascular: Normal heart sounds.   Respiratory: Breath sounds normal.  Musculoskeletal:       Right knee: She exhibits decreased range of motion and effusion. tenderness found. Medial joint line tenderness noted.  Neurological: She is alert and oriented to person, place, and time.  Psychiatric: She has a normal mood and affect.    Vital signs in last 24 hours:    Labs:   Estimated Body mass index is 41.85 kg/(m^2) as calculated from the following:   Height as of 01/05/12: 5' 3.5"(1.613 m).   Weight as of 01/21/12: 240 lb(108.863 kg).   Imaging Review Plain radiographs demonstrate severe degenerative joint disease of the right knee(s). The overall alignment ismild varus. The bone quality appears to be adequate for age and reported activity level.  Assessment/Plan:  End stage arthritis, right knee   The patient history, physical examination, clinical judgment of the provider and imaging studies are consistent with end stage degenerative joint disease of the right knee(s) and total knee arthroplasty is deemed medically necessary. The treatment options including medical management, injection therapy arthroscopy and arthroplasty were discussed at length. The risks and benefits of total knee arthroplasty were presented and reviewed. The risks due to aseptic loosening, infection, stiffness,  patella tracking problems, thromboembolic complications and other imponderables were discussed. The patient acknowledged the explanation, agreed to proceed with the plan and consent was signed. Patient is being admitted for inpatient treatment for surgery, pain control, PT, OT, prophylactic antibiotics, VTE prophylaxis, progressive ambulation and ADL's and discharge planning. The patient is planning to be discharged to skilled nursing facility

## 2012-02-06 MED ORDER — CEFAZOLIN SODIUM-DEXTROSE 2-3 GM-% IV SOLR
2.0000 g | INTRAVENOUS | Status: AC
  Administered 2012-02-07: 2 g via INTRAVENOUS
  Filled 2012-02-06: qty 50

## 2012-02-07 ENCOUNTER — Encounter (HOSPITAL_COMMUNITY): Payer: Self-pay | Admitting: Surgery

## 2012-02-07 ENCOUNTER — Encounter (HOSPITAL_COMMUNITY): Payer: Self-pay | Admitting: Vascular Surgery

## 2012-02-07 ENCOUNTER — Inpatient Hospital Stay (HOSPITAL_COMMUNITY)
Admission: RE | Admit: 2012-02-07 | Discharge: 2012-02-10 | DRG: 470 | Disposition: A | Discharge status: Skilled Nursing Facility | Payer: Medicare Other | Source: Ambulatory Visit | Attending: Orthopedic Surgery | Admitting: Orthopedic Surgery

## 2012-02-07 ENCOUNTER — Inpatient Hospital Stay (HOSPITAL_COMMUNITY): Payer: Medicare Other | Admitting: Anesthesiology

## 2012-02-07 ENCOUNTER — Encounter (HOSPITAL_COMMUNITY): Payer: Self-pay | Admitting: Anesthesiology

## 2012-02-07 ENCOUNTER — Encounter (HOSPITAL_COMMUNITY)
Admission: RE | Disposition: A | Discharge status: Skilled Nursing Facility | Payer: Self-pay | Source: Ambulatory Visit | Attending: Orthopedic Surgery

## 2012-02-07 DIAGNOSIS — M069 Rheumatoid arthritis, unspecified: Secondary | ICD-10-CM | POA: Diagnosis present

## 2012-02-07 DIAGNOSIS — K219 Gastro-esophageal reflux disease without esophagitis: Secondary | ICD-10-CM | POA: Diagnosis present

## 2012-02-07 DIAGNOSIS — Z87891 Personal history of nicotine dependence: Secondary | ICD-10-CM

## 2012-02-07 DIAGNOSIS — Z7982 Long term (current) use of aspirin: Secondary | ICD-10-CM

## 2012-02-07 DIAGNOSIS — M1711 Unilateral primary osteoarthritis, right knee: Secondary | ICD-10-CM | POA: Diagnosis present

## 2012-02-07 DIAGNOSIS — D62 Acute posthemorrhagic anemia: Secondary | ICD-10-CM | POA: Diagnosis not present

## 2012-02-07 DIAGNOSIS — Z79899 Other long term (current) drug therapy: Secondary | ICD-10-CM

## 2012-02-07 DIAGNOSIS — E7800 Pure hypercholesterolemia, unspecified: Secondary | ICD-10-CM | POA: Diagnosis present

## 2012-02-07 DIAGNOSIS — F319 Bipolar disorder, unspecified: Secondary | ICD-10-CM | POA: Diagnosis present

## 2012-02-07 DIAGNOSIS — Z01812 Encounter for preprocedural laboratory examination: Secondary | ICD-10-CM

## 2012-02-07 DIAGNOSIS — I5032 Chronic diastolic (congestive) heart failure: Secondary | ICD-10-CM | POA: Diagnosis present

## 2012-02-07 DIAGNOSIS — M1710 Unilateral primary osteoarthritis, unspecified knee: Principal | ICD-10-CM | POA: Diagnosis present

## 2012-02-07 DIAGNOSIS — E039 Hypothyroidism, unspecified: Secondary | ICD-10-CM | POA: Diagnosis present

## 2012-02-07 DIAGNOSIS — Z833 Family history of diabetes mellitus: Secondary | ICD-10-CM

## 2012-02-07 DIAGNOSIS — M109 Gout, unspecified: Secondary | ICD-10-CM | POA: Diagnosis present

## 2012-02-07 DIAGNOSIS — Z8249 Family history of ischemic heart disease and other diseases of the circulatory system: Secondary | ICD-10-CM

## 2012-02-07 DIAGNOSIS — F4310 Post-traumatic stress disorder, unspecified: Secondary | ICD-10-CM | POA: Diagnosis present

## 2012-02-07 DIAGNOSIS — E1149 Type 2 diabetes mellitus with other diabetic neurological complication: Secondary | ICD-10-CM | POA: Diagnosis present

## 2012-02-07 DIAGNOSIS — IMO0001 Reserved for inherently not codable concepts without codable children: Secondary | ICD-10-CM | POA: Diagnosis present

## 2012-02-07 DIAGNOSIS — E1142 Type 2 diabetes mellitus with diabetic polyneuropathy: Secondary | ICD-10-CM | POA: Diagnosis present

## 2012-02-07 DIAGNOSIS — Z794 Long term (current) use of insulin: Secondary | ICD-10-CM

## 2012-02-07 DIAGNOSIS — E042 Nontoxic multinodular goiter: Secondary | ICD-10-CM | POA: Diagnosis present

## 2012-02-07 HISTORY — DX: Personal history of other diseases of the musculoskeletal system and connective tissue: Z87.39

## 2012-02-07 HISTORY — DX: Type 2 diabetes mellitus without complications: E11.9

## 2012-02-07 HISTORY — DX: Post-traumatic stress disorder, unspecified: F43.10

## 2012-02-07 HISTORY — PX: TOTAL KNEE ARTHROPLASTY: SHX125

## 2012-02-07 HISTORY — DX: Calculus of kidney: N20.0

## 2012-02-07 HISTORY — DX: Iron deficiency anemia, unspecified: D50.9

## 2012-02-07 LAB — GLUCOSE, CAPILLARY
Glucose-Capillary: 175 mg/dL — ABNORMAL HIGH (ref 70–99)
Glucose-Capillary: 180 mg/dL — ABNORMAL HIGH (ref 70–99)
Glucose-Capillary: 232 mg/dL — ABNORMAL HIGH (ref 70–99)
Glucose-Capillary: 258 mg/dL — ABNORMAL HIGH (ref 70–99)

## 2012-02-07 SURGERY — ARTHROPLASTY, KNEE, TOTAL
Anesthesia: Choice | Site: Knee | Laterality: Right | Wound class: Clean

## 2012-02-07 MED ORDER — INSULIN ASPART PROT & ASPART (70-30 MIX) 100 UNIT/ML ~~LOC~~ SUSP
25.0000 [IU] | Freq: Every day | SUBCUTANEOUS | Status: DC
  Administered 2012-02-08 – 2012-02-09 (×2): 25 [IU] via SUBCUTANEOUS

## 2012-02-07 MED ORDER — TOPIRAMATE 100 MG PO TABS
100.0000 mg | ORAL_TABLET | Freq: Two times a day (BID) | ORAL | Status: DC
  Administered 2012-02-07 – 2012-02-09 (×5): 100 mg via ORAL
  Filled 2012-02-07 (×8): qty 1

## 2012-02-07 MED ORDER — LACTATED RINGERS IV SOLN
INTRAVENOUS | Status: DC | PRN
  Administered 2012-02-07 (×2): via INTRAVENOUS

## 2012-02-07 MED ORDER — ARTIFICIAL TEARS OP OINT
TOPICAL_OINTMENT | OPHTHALMIC | Status: DC | PRN
  Administered 2012-02-07: 1 via OPHTHALMIC

## 2012-02-07 MED ORDER — LEVOTHYROXINE SODIUM 50 MCG PO TABS
50.0000 ug | ORAL_TABLET | Freq: Every day | ORAL | Status: DC
  Administered 2012-02-08 – 2012-02-10 (×3): 50 ug via ORAL
  Filled 2012-02-07 (×4): qty 1

## 2012-02-07 MED ORDER — ACETAMINOPHEN 10 MG/ML IV SOLN
INTRAVENOUS | Status: AC
  Filled 2012-02-07: qty 100

## 2012-02-07 MED ORDER — INSULIN ASPART PROT & ASPART (70-30 MIX) 100 UNIT/ML ~~LOC~~ SUSP
35.0000 [IU] | Freq: Every day | SUBCUTANEOUS | Status: DC
  Administered 2012-02-08 – 2012-02-10 (×3): 35 [IU] via SUBCUTANEOUS

## 2012-02-07 MED ORDER — MIDAZOLAM HCL 2 MG/2ML IJ SOLN
1.0000 mg | INTRAMUSCULAR | Status: DC | PRN
  Administered 2012-02-07: 2 mg via INTRAVENOUS

## 2012-02-07 MED ORDER — GABAPENTIN 300 MG PO CAPS
600.0000 mg | ORAL_CAPSULE | Freq: Three times a day (TID) | ORAL | Status: DC
  Administered 2012-02-07 – 2012-02-10 (×8): 600 mg via ORAL
  Filled 2012-02-07 (×11): qty 2

## 2012-02-07 MED ORDER — DIPHENHYDRAMINE HCL 12.5 MG/5ML PO ELIX: 12.5000 mg | ORAL_SOLUTION | ORAL | Status: DC | PRN

## 2012-02-07 MED ORDER — ALUM & MAG HYDROXIDE-SIMETH 200-200-20 MG/5ML PO SUSP: 30.0000 mL | ORAL | Status: DC | PRN

## 2012-02-07 MED ORDER — ONDANSETRON HCL 4 MG/2ML IJ SOLN
INTRAMUSCULAR | Status: DC | PRN
  Administered 2012-02-07: 4 mg via INTRAVENOUS

## 2012-02-07 MED ORDER — FENTANYL CITRATE 0.05 MG/ML IJ SOLN
INTRAMUSCULAR | Status: DC | PRN
  Administered 2012-02-07 (×2): 25 ug via INTRAVENOUS

## 2012-02-07 MED ORDER — ACETAMINOPHEN 325 MG PO TABS: 650.0000 mg | ORAL_TABLET | Freq: Four times a day (QID) | ORAL | Status: DC | PRN

## 2012-02-07 MED ORDER — QUETIAPINE FUMARATE ER 50 MG PO TB24
450.0000 mg | ORAL_TABLET | Freq: Every day | ORAL | Status: DC
  Administered 2012-02-07 – 2012-02-09 (×3): 450 mg via ORAL
  Filled 2012-02-07 (×4): qty 1

## 2012-02-07 MED ORDER — ATORVASTATIN CALCIUM 20 MG PO TABS
20.0000 mg | ORAL_TABLET | Freq: Every day | ORAL | Status: DC
  Administered 2012-02-07 – 2012-02-10 (×4): 20 mg via ORAL
  Filled 2012-02-07 (×4): qty 1

## 2012-02-07 MED ORDER — ASPIRIN EC 325 MG PO TBEC
325.0000 mg | DELAYED_RELEASE_TABLET | Freq: Two times a day (BID) | ORAL | Status: DC
  Administered 2012-02-07 – 2012-02-10 (×7): 325 mg via ORAL
  Filled 2012-02-07 (×8): qty 1

## 2012-02-07 MED ORDER — ONDANSETRON HCL 4 MG PO TABS: 4.0000 mg | ORAL_TABLET | Freq: Four times a day (QID) | ORAL | Status: DC | PRN

## 2012-02-07 MED ORDER — PHENOL 1.4 % MT LIQD: 1.0000 | OROMUCOSAL | Status: DC | PRN

## 2012-02-07 MED ORDER — OXYBUTYNIN CHLORIDE ER 10 MG PO TB24
10.0000 mg | ORAL_TABLET | Freq: Every evening | ORAL | Status: DC
  Administered 2012-02-07 – 2012-02-09 (×3): 10 mg via ORAL
  Filled 2012-02-07 (×4): qty 1

## 2012-02-07 MED ORDER — QUETIAPINE FUMARATE ER 50 MG PO TB24
150.0000 mg | ORAL_TABLET | Freq: Two times a day (BID) | ORAL | Status: DC
  Filled 2012-02-07 (×2): qty 9

## 2012-02-07 MED ORDER — HYDROMORPHONE HCL PF 1 MG/ML IJ SOLN: 0.2500 mg | INTRAMUSCULAR | Status: DC | PRN

## 2012-02-07 MED ORDER — OXYCODONE HCL 5 MG/5ML PO SOLN: 5.0000 mg | Freq: Once | ORAL | Status: DC | PRN

## 2012-02-07 MED ORDER — HYDROCODONE-ACETAMINOPHEN 5-325 MG PO TABS
1.0000 | ORAL_TABLET | ORAL | Status: DC | PRN
  Administered 2012-02-07 – 2012-02-10 (×3): 2 via ORAL
  Filled 2012-02-07 (×4): qty 2

## 2012-02-07 MED ORDER — ESTROGENS CONJUGATED 0.3 MG PO TABS
0.3000 mg | ORAL_TABLET | Freq: Every day | ORAL | Status: DC
  Administered 2012-02-07 – 2012-02-10 (×4): 0.3 mg via ORAL
  Filled 2012-02-07 (×4): qty 1

## 2012-02-07 MED ORDER — HYDROMORPHONE HCL PF 1 MG/ML IJ SOLN
1.0000 mg | INTRAMUSCULAR | Status: DC | PRN
  Administered 2012-02-07 – 2012-02-08 (×2): 1 mg via INTRAVENOUS
  Filled 2012-02-07 (×2): qty 1

## 2012-02-07 MED ORDER — BISACODYL 5 MG PO TBEC
5.0000 mg | DELAYED_RELEASE_TABLET | Freq: Every day | ORAL | Status: DC | PRN
  Administered 2012-02-10: 5 mg via ORAL
  Filled 2012-02-07: qty 1

## 2012-02-07 MED ORDER — MIDAZOLAM HCL 2 MG/2ML IJ SOLN
INTRAMUSCULAR | Status: AC
  Filled 2012-02-07: qty 2

## 2012-02-07 MED ORDER — MIRTAZAPINE 30 MG PO TABS
30.0000 mg | ORAL_TABLET | Freq: Every day | ORAL | Status: DC
  Administered 2012-02-07 – 2012-02-09 (×3): 30 mg via ORAL
  Filled 2012-02-07 (×4): qty 1

## 2012-02-07 MED ORDER — FENTANYL CITRATE 0.05 MG/ML IJ SOLN
INTRAMUSCULAR | Status: AC
  Filled 2012-02-07: qty 2

## 2012-02-07 MED ORDER — LACTATED RINGERS IV SOLN
INTRAVENOUS | Status: DC
  Administered 2012-02-07: 09:00:00 via INTRAVENOUS

## 2012-02-07 MED ORDER — MENTHOL 3 MG MT LOZG: 1.0000 | LOZENGE | OROMUCOSAL | Status: DC | PRN

## 2012-02-07 MED ORDER — VENLAFAXINE HCL ER 150 MG PO CP24
150.0000 mg | ORAL_CAPSULE | Freq: Every day | ORAL | Status: DC
  Administered 2012-02-07 – 2012-02-10 (×4): 150 mg via ORAL
  Filled 2012-02-07 (×4): qty 1

## 2012-02-07 MED ORDER — PROMETHAZINE HCL 25 MG/ML IJ SOLN: 6.2500 mg | INTRAMUSCULAR | Status: DC | PRN

## 2012-02-07 MED ORDER — METOCLOPRAMIDE HCL 5 MG/ML IJ SOLN: 5.0000 mg | Freq: Three times a day (TID) | INTRAMUSCULAR | Status: DC | PRN

## 2012-02-07 MED ORDER — HYDROCHLOROTHIAZIDE 25 MG PO TABS
25.0000 mg | ORAL_TABLET | Freq: Every day | ORAL | Status: DC
  Administered 2012-02-08 – 2012-02-10 (×3): 25 mg via ORAL
  Filled 2012-02-07 (×3): qty 1

## 2012-02-07 MED ORDER — LIDOCAINE HCL (CARDIAC) 20 MG/ML IV SOLN
INTRAVENOUS | Status: DC | PRN
  Administered 2012-02-07: 100 mg via INTRAVENOUS

## 2012-02-07 MED ORDER — OXYCODONE HCL 5 MG PO TABS: 5.0000 mg | ORAL_TABLET | Freq: Once | ORAL | Status: DC | PRN

## 2012-02-07 MED ORDER — MAGNESIUM HYDROXIDE 400 MG/5ML PO SUSP: 30.0000 mL | Freq: Every day | ORAL | Status: DC | PRN

## 2012-02-07 MED ORDER — INSULIN PEN NEEDLE 31G X 5 MM MISC: 1.0000 | Freq: Two times a day (BID) | Status: DC

## 2012-02-07 MED ORDER — KCL IN DEXTROSE-NACL 20-5-0.45 MEQ/L-%-% IV SOLN
INTRAVENOUS | Status: DC
  Administered 2012-02-07: 15:00:00 via INTRAVENOUS
  Filled 2012-02-07 (×12): qty 1000

## 2012-02-07 MED ORDER — CEFUROXIME SODIUM 1.5 G IJ SOLR
INTRAMUSCULAR | Status: DC | PRN
  Administered 2012-02-07: 1.5 g

## 2012-02-07 MED ORDER — PANTOPRAZOLE SODIUM 40 MG PO TBEC
40.0000 mg | DELAYED_RELEASE_TABLET | Freq: Every day | ORAL | Status: DC
  Administered 2012-02-07 – 2012-02-09 (×3): 40 mg via ORAL
  Filled 2012-02-07 (×3): qty 1

## 2012-02-07 MED ORDER — FUROSEMIDE 40 MG PO TABS
40.0000 mg | ORAL_TABLET | Freq: Every evening | ORAL | Status: DC
  Administered 2012-02-08 – 2012-02-09 (×2): 40 mg via ORAL
  Filled 2012-02-07 (×4): qty 1

## 2012-02-07 MED ORDER — INSULIN ASPART 100 UNIT/ML ~~LOC~~ SOLN
0.0000 [IU] | Freq: Three times a day (TID) | SUBCUTANEOUS | Status: DC
  Administered 2012-02-07: 5 [IU] via SUBCUTANEOUS
  Administered 2012-02-08: 2 [IU] via SUBCUTANEOUS
  Administered 2012-02-08: 8 [IU] via SUBCUTANEOUS
  Administered 2012-02-08 (×2): 2 [IU] via SUBCUTANEOUS
  Administered 2012-02-09: 3 [IU] via SUBCUTANEOUS
  Administered 2012-02-09: 2 [IU] via SUBCUTANEOUS
  Administered 2012-02-10: 3 [IU] via SUBCUTANEOUS

## 2012-02-07 MED ORDER — DEXAMETHASONE SODIUM PHOSPHATE 4 MG/ML IJ SOLN
INTRAMUSCULAR | Status: DC | PRN
  Administered 2012-02-07: 4 mg

## 2012-02-07 MED ORDER — PROPOFOL 10 MG/ML IV BOLUS
INTRAVENOUS | Status: DC | PRN
  Administered 2012-02-07: 160 mg via INTRAVENOUS

## 2012-02-07 MED ORDER — ACETAMINOPHEN 650 MG RE SUPP: 650.0000 mg | Freq: Four times a day (QID) | RECTAL | Status: DC | PRN

## 2012-02-07 MED ORDER — ACETAMINOPHEN 10 MG/ML IV SOLN
1000.0000 mg | Freq: Once | INTRAVENOUS | Status: AC
  Administered 2012-02-07: 1000 mg via INTRAVENOUS
  Filled 2012-02-07: qty 100

## 2012-02-07 MED ORDER — VITAMIN B-1 100 MG PO TABS
100.0000 mg | ORAL_TABLET | Freq: Every evening | ORAL | Status: DC
  Administered 2012-02-07 – 2012-02-09 (×3): 100 mg via ORAL
  Filled 2012-02-07 (×4): qty 1

## 2012-02-07 MED ORDER — METHOCARBAMOL 100 MG/ML IJ SOLN
500.0000 mg | Freq: Four times a day (QID) | INTRAVENOUS | Status: DC | PRN
  Filled 2012-02-07: qty 5

## 2012-02-07 MED ORDER — ACETAMINOPHEN 10 MG/ML IV SOLN
1000.0000 mg | Freq: Four times a day (QID) | INTRAVENOUS | Status: DC
  Administered 2012-02-07 – 2012-02-08 (×3): 1000 mg via INTRAVENOUS
  Filled 2012-02-07 (×4): qty 100

## 2012-02-07 MED ORDER — HYDROMORPHONE HCL PF 1 MG/ML IJ SOLN
INTRAMUSCULAR | Status: AC
  Filled 2012-02-07: qty 1

## 2012-02-07 MED ORDER — METHOCARBAMOL 100 MG/ML IJ SOLN
500.0000 mg | INTRAVENOUS | Status: DC
  Filled 2012-02-07: qty 5

## 2012-02-07 MED ORDER — FLEET ENEMA 7-19 GM/118ML RE ENEM: 1.0000 | ENEMA | Freq: Once | RECTAL | Status: AC | PRN

## 2012-02-07 MED ORDER — CHLORHEXIDINE GLUCONATE 4 % EX LIQD: 60.0000 mL | Freq: Once | CUTANEOUS | Status: DC

## 2012-02-07 MED ORDER — DEXTROSE-NACL 5-0.45 % IV SOLN: INTRAVENOUS | Status: DC

## 2012-02-07 MED ORDER — CEFUROXIME SODIUM 1.5 G IJ SOLR
INTRAMUSCULAR | Status: AC
  Filled 2012-02-07: qty 1.5

## 2012-02-07 MED ORDER — ARIPIPRAZOLE 10 MG PO TABS
20.0000 mg | ORAL_TABLET | Freq: Every day | ORAL | Status: DC
  Administered 2012-02-07 – 2012-02-09 (×3): 20 mg via ORAL
  Filled 2012-02-07 (×4): qty 2

## 2012-02-07 MED ORDER — METOCLOPRAMIDE HCL 10 MG PO TABS: 5.0000 mg | ORAL_TABLET | Freq: Three times a day (TID) | ORAL | Status: DC | PRN

## 2012-02-07 MED ORDER — ONDANSETRON HCL 4 MG/2ML IJ SOLN: 4.0000 mg | Freq: Four times a day (QID) | INTRAMUSCULAR | Status: DC | PRN

## 2012-02-07 MED ORDER — CLONAZEPAM 1 MG PO TABS
2.0000 mg | ORAL_TABLET | Freq: Every day | ORAL | Status: DC
  Administered 2012-02-07 – 2012-02-09 (×3): 2 mg via ORAL
  Filled 2012-02-07 (×3): qty 2

## 2012-02-07 MED ORDER — LAMOTRIGINE 200 MG PO TABS
200.0000 mg | ORAL_TABLET | Freq: Every day | ORAL | Status: DC
  Administered 2012-02-07 – 2012-02-09 (×3): 200 mg via ORAL
  Filled 2012-02-07 (×4): qty 1

## 2012-02-07 MED ORDER — HYDROCHLOROTHIAZIDE 25 MG PO TABS
25.0000 mg | ORAL_TABLET | ORAL | Status: DC
  Filled 2012-02-07: qty 1

## 2012-02-07 MED ORDER — CELECOXIB 200 MG PO CAPS
200.0000 mg | ORAL_CAPSULE | Freq: Two times a day (BID) | ORAL | Status: DC
  Administered 2012-02-07 – 2012-02-10 (×7): 200 mg via ORAL
  Filled 2012-02-07 (×8): qty 1

## 2012-02-07 MED ORDER — INSULIN ASPART PROT & ASPART (70-30 MIX) 100 UNIT/ML ~~LOC~~ SUSP
15.0000 [IU] | Freq: Two times a day (BID) | SUBCUTANEOUS | Status: DC
  Filled 2012-02-07: qty 3

## 2012-02-07 MED ORDER — BUPIVACAINE-EPINEPHRINE PF 0.5-1:200000 % IJ SOLN
INTRAMUSCULAR | Status: DC | PRN
  Administered 2012-02-07: 25 mL

## 2012-02-07 MED ORDER — METHOCARBAMOL 500 MG PO TABS: 500.0000 mg | ORAL_TABLET | Freq: Four times a day (QID) | ORAL | Status: DC | PRN

## 2012-02-07 MED ORDER — POTASSIUM CITRATE ER 10 MEQ (1080 MG) PO TBCR
20.0000 meq | EXTENDED_RELEASE_TABLET | Freq: Two times a day (BID) | ORAL | Status: DC
  Administered 2012-02-08 – 2012-02-10 (×6): 20 meq via ORAL
  Filled 2012-02-07 (×9): qty 2

## 2012-02-07 MED ORDER — FENTANYL CITRATE 0.05 MG/ML IJ SOLN
50.0000 ug | Freq: Once | INTRAMUSCULAR | Status: AC
  Administered 2012-02-07: 100 ug via INTRAVENOUS

## 2012-02-07 MED ORDER — SODIUM CHLORIDE 0.9 % IR SOLN
Status: DC | PRN
  Administered 2012-02-07: 1000 mL
  Administered 2012-02-07: 3000 mL

## 2012-02-07 MED ORDER — SUCCINYLCHOLINE CHLORIDE 20 MG/ML IJ SOLN
INTRAMUSCULAR | Status: DC | PRN
  Administered 2012-02-07: 100 mg via INTRAVENOUS

## 2012-02-07 MED ORDER — QUETIAPINE FUMARATE ER 50 MG PO TB24
150.0000 mg | ORAL_TABLET | Freq: Every day | ORAL | Status: DC
  Administered 2012-02-08 – 2012-02-10 (×3): 150 mg via ORAL
  Filled 2012-02-07 (×4): qty 3

## 2012-02-07 SURGICAL SUPPLY — 63 items
BANDAGE ESMARK 6X9 LF (GAUZE/BANDAGES/DRESSINGS) ×1 IMPLANT
BLADE SAG 18X100X1.27 (BLADE) ×2 IMPLANT
BLADE SAW SGTL 13X75X1.27 (BLADE) ×2 IMPLANT
BLADE SURG ROTATE 9660 (MISCELLANEOUS) IMPLANT
BNDG ELASTIC 6X10 VLCR STRL LF (GAUZE/BANDAGES/DRESSINGS) ×2 IMPLANT
BNDG ESMARK 6X9 LF (GAUZE/BANDAGES/DRESSINGS) ×2
BOWL SMART MIX CTS (DISPOSABLE) ×2 IMPLANT
CEMENT HV SMART SET (Cement) ×4 IMPLANT
CLOTH BEACON ORANGE TIMEOUT ST (SAFETY) ×2 IMPLANT
COVER BACK TABLE 24X17X13 BIG (DRAPES) IMPLANT
COVER SURGICAL LIGHT HANDLE (MISCELLANEOUS) ×2 IMPLANT
CUFF TOURNIQUET SINGLE 34IN LL (TOURNIQUET CUFF) IMPLANT
CUFF TOURNIQUET SINGLE 44IN (TOURNIQUET CUFF) ×2 IMPLANT
DRAPE EXTREMITY T 121X128X90 (DRAPE) ×2 IMPLANT
DRAPE U-SHAPE 47X51 STRL (DRAPES) ×2 IMPLANT
DRSG PAD ABDOMINAL 8X10 ST (GAUZE/BANDAGES/DRESSINGS) ×2 IMPLANT
DURAPREP 26ML APPLICATOR (WOUND CARE) ×2 IMPLANT
ELECT REM PT RETURN 9FT ADLT (ELECTROSURGICAL) ×2
ELECTRODE REM PT RTRN 9FT ADLT (ELECTROSURGICAL) ×1 IMPLANT
EVACUATOR 1/8 PVC DRAIN (DRAIN) ×2 IMPLANT
GAUZE XEROFORM 1X8 LF (GAUZE/BANDAGES/DRESSINGS) ×2 IMPLANT
GLOVE BIO SURGEON STRL SZ7 (GLOVE) ×2 IMPLANT
GLOVE BIO SURGEON STRL SZ7.5 (GLOVE) ×2 IMPLANT
GLOVE BIO SURGEON STRL SZ8.5 (GLOVE) ×4 IMPLANT
GLOVE BIOGEL PI IND STRL 6.5 (GLOVE) ×1 IMPLANT
GLOVE BIOGEL PI IND STRL 7.0 (GLOVE) ×1 IMPLANT
GLOVE BIOGEL PI IND STRL 8 (GLOVE) ×1 IMPLANT
GLOVE BIOGEL PI INDICATOR 6.5 (GLOVE) ×1
GLOVE BIOGEL PI INDICATOR 7.0 (GLOVE) ×1
GLOVE BIOGEL PI INDICATOR 8 (GLOVE) ×1
GLOVE BIOGEL PI ORTHO PRO 7.5 (GLOVE) ×1
GLOVE PI ORTHO PRO STRL 7.5 (GLOVE) ×1 IMPLANT
GLOVE SS BIOGEL STRL SZ 8.5 (GLOVE) ×1 IMPLANT
GLOVE SUPERSENSE BIOGEL SZ 8.5 (GLOVE) ×1
GLOVE SURG SS PI 7.5 STRL IVOR (GLOVE) ×2 IMPLANT
GOWN PREVENTION PLUS XLARGE (GOWN DISPOSABLE) ×2 IMPLANT
GOWN SRG XL XLNG 56XLVL 4 (GOWN DISPOSABLE) ×1 IMPLANT
GOWN STRL NON-REIN LRG LVL3 (GOWN DISPOSABLE) ×2 IMPLANT
GOWN STRL NON-REIN XL XLG LVL4 (GOWN DISPOSABLE) ×1
GOWN STRL REIN 2XL XLG LVL4 (GOWN DISPOSABLE) ×4 IMPLANT
HANDPIECE INTERPULSE COAX TIP (DISPOSABLE) ×1
HOOD PEEL AWAY FACE SHEILD DIS (HOOD) ×4 IMPLANT
KIT BASIN OR (CUSTOM PROCEDURE TRAY) ×2 IMPLANT
KIT ROOM TURNOVER OR (KITS) ×2 IMPLANT
MANIFOLD NEPTUNE II (INSTRUMENTS) ×2 IMPLANT
NS IRRIG 1000ML POUR BTL (IV SOLUTION) ×2 IMPLANT
PACK TOTAL JOINT (CUSTOM PROCEDURE TRAY) ×2 IMPLANT
PAD ARMBOARD 7.5X6 YLW CONV (MISCELLANEOUS) ×2 IMPLANT
PADDING CAST COTTON 6X4 STRL (CAST SUPPLIES) ×2 IMPLANT
SET HNDPC FAN SPRY TIP SCT (DISPOSABLE) ×1 IMPLANT
SPONGE GAUZE 4X4 12PLY (GAUZE/BANDAGES/DRESSINGS) ×2 IMPLANT
STAPLER VISISTAT 35W (STAPLE) ×2 IMPLANT
SUCTION FRAZIER TIP 10 FR DISP (SUCTIONS) ×2 IMPLANT
SUT VIC AB 0 CTX 36 (SUTURE) ×1
SUT VIC AB 0 CTX36XBRD ANTBCTR (SUTURE) ×1 IMPLANT
SUT VIC AB 1 CTX 36 (SUTURE) ×1
SUT VIC AB 1 CTX36XBRD ANBCTR (SUTURE) ×1 IMPLANT
SUT VIC AB 2-0 CT1 27 (SUTURE) ×1
SUT VIC AB 2-0 CT1 TAPERPNT 27 (SUTURE) ×1 IMPLANT
TOWEL OR 17X24 6PK STRL BLUE (TOWEL DISPOSABLE) ×2 IMPLANT
TOWEL OR 17X26 10 PK STRL BLUE (TOWEL DISPOSABLE) ×2 IMPLANT
TRAY FOLEY CATH 14FR (SET/KITS/TRAYS/PACK) ×2 IMPLANT
WATER STERILE IRR 1000ML POUR (IV SOLUTION) ×2 IMPLANT

## 2012-02-07 NOTE — Transfer of Care (Signed)
Immediate Anesthesia Transfer of Care Note  Patient: Brittany Acosta  Procedure(s) Performed: Procedure(s) (LRB) with comments: TOTAL KNEE ARTHROPLASTY (Right)  Patient Location: PACU  Anesthesia Type:General  Level of Consciousness: awake and oriented  Airway & Oxygen Therapy: Patient Spontanous Breathing and Patient connected to face mask oxygen  Post-op Assessment: Report given to PACU RN and Post -op Vital signs reviewed and stable  Post vital signs: Reviewed and stable  Complications: No apparent anesthesia complications

## 2012-02-07 NOTE — Anesthesia Procedure Notes (Addendum)
Anesthesia Regional Block:  Femoral nerve block  Pre-Anesthetic Checklist: ,, timeout performed, Correct Patient, Correct Site, Correct Laterality, Correct Procedure, Correct Position, site marked, Risks and benefits discussed,  Surgical consent,  Pre-op evaluation,  At surgeon's request and post-op pain management  Laterality: Right  Prep: chloraprep       Needles:  Injection technique: Single-shot  Needle Type: Stimulator Needle - 80     Needle Length: 8cm  Needle Gauge: 22 and 22 G    Additional Needles:  Procedures: nerve stimulator Femoral nerve block  Nerve Stimulator or Paresthesia:  Response: 0.48 mA,   Additional Responses:   Narrative:  Start time: 02/07/2012 9:45 AM End time: 02/07/2012 9:55 AM Injection made incrementally with aspirations every 5 mL. Anesthesiologist: Dr Gypsy Balsam  Additional Notes: [id number] R FNB POP CHG prep, sterile tech #[id number] stim needle w/stim down to .48ma Multiple neg asp Marc .5% w/epi 25cc+ decadron 4mg  infil No compl Dr Gypsy Balsam     Procedure Name: Intubation Date/Time: 02/07/2012 10:16 AM Performed by: Romie Minus K Pre-anesthesia Checklist: Patient identified, Emergency Drugs available, Suction available, Patient being monitored and Timeout performed Patient Re-evaluated:Patient Re-evaluated prior to inductionOxygen Delivery Method: Circle system utilized Preoxygenation: Pre-oxygenation with 100% oxygen Intubation Type: IV induction Ventilation: Mask ventilation without difficulty Grade View: Grade I Tube type: Oral Tube size: 7.0 mm Number of attempts: 1 Airway Equipment and Method: Video-laryngoscopy and Stylet Placement Confirmation: positive ETCO2 and breath sounds checked- equal and bilateral Secured at: 21 cm Tube secured with: Tape Dental Injury: Teeth and Oropharynx as per pre-operative assessment  Difficulty Due To: Difficulty was anticipated and Difficult Airway- due to limited oral opening Future  Recommendations: Recommend- induction with short-acting agent, and alternative techniques readily available

## 2012-02-07 NOTE — Anesthesia Postprocedure Evaluation (Signed)
  Anesthesia Post-op Note  Patient: Brittany Acosta  Procedure(s) Performed: Procedure(s) (LRB) with comments: TOTAL KNEE ARTHROPLASTY (Right)  Patient Location: PACU  Anesthesia Type:GA combined with regional for post-op pain  Level of Consciousness: awake and alert   Airway and Oxygen Therapy: Patient Spontanous Breathing  Post-op Pain: mild  Post-op Assessment: Post-op Vital signs reviewed, Patient's Cardiovascular Status Stable, Respiratory Function Stable, Patent Airway, No signs of Nausea or vomiting and Pain level controlled  Post-op Vital Signs: stable  Complications: No apparent anesthesia complications

## 2012-02-07 NOTE — Anesthesia Preprocedure Evaluation (Addendum)
Anesthesia Evaluation  Patient identified by MRN, date of birth, ID band Patient awake    Reviewed: Allergy & Precautions, H&P , NPO status , Patient's Chart, lab work & pertinent test results, reviewed documented beta blocker date and time   History of Anesthesia Complications (+) PONV  Airway Mallampati: III TM Distance: >3 FB Neck ROM: Limited    Dental No notable dental hx. (+) Teeth Intact and Dental Advisory Given   Pulmonary sleep apnea , former smoker,  Suspected Undiagnosed OSA breath sounds clear to auscultation  Pulmonary exam normal       Cardiovascular hypertension, Pt. on medications +CHF Rhythm:Regular Rate:Normal  Hx/o Diastolic Heart Failure   Neuro/Psych PSYCHIATRIC DISORDERS Anxiety Depression Bipolar Disorder Fibromyalgia  Neuromuscular disease    GI/Hepatic Neg liver ROS, GERD-  Medicated and Controlled,  Endo/Other  diabetes, Type 2, Insulin DependentHypothyroidism Morbid obesityHx/o large multinodular goiter  Renal/GU negative Renal ROS  negative genitourinary   Musculoskeletal  (+) Fibromyalgia -  Abdominal Normal abdominal exam  (+) + obese,  Abdomen: soft.    Peds  (+) Congenital Heart Disease Hematology negative hematology ROS (+)   Anesthesia Other Findings   Reproductive/Obstetrics negative OB ROS                       Anesthesia Physical Anesthesia Plan  ASA: III  Anesthesia Plan: General   Post-op Pain Management:    Induction: Intravenous  Airway Management Planned: Oral ETT and Video Laryngoscope Planned  Additional Equipment:   Intra-op Plan:   Post-operative Plan: Extubation in OR  Informed Consent: I have reviewed the patients History and Physical, chart, labs and discussed the procedure including the risks, benefits and alternatives for the proposed anesthesia with the patient or authorized representative who has indicated his/her  understanding and acceptance.     Plan Discussed with: CRNA and Surgeon  Anesthesia Plan Comments:         Anesthesia Quick Evaluation

## 2012-02-07 NOTE — Progress Notes (Signed)
Orthopedic Tech Progress Note Patient Details:  Brittany Acosta 1950/08/10 [id number] CPM applied to Right Le with appropriate settings; OHF applied to bed CPM Right Knee CPM Right Knee: On Right Knee Flexion (Degrees): 60  Right Knee Extension (Degrees): 0    Asia R Thompson 02/07/2012, 1:15 PM

## 2012-02-07 NOTE — Op Note (Signed)
PATIENT ID:      Brittany Acosta  MRN:     [id number] DOB/AGE:    Jun 04, 1950 / 61 y.o.       OPERATIVE REPORT    DATE OF PROCEDURE:  02/07/2012       PREOPERATIVE DIAGNOSIS:   OSTEOARTHRITIS RIGHT KNEE      Estimated Body mass index is 41.85 kg/(m^2) as calculated from the following:   Height as of 01/05/12: 5' 3.5"(1.613 m).   Weight as of 01/21/12: 240 lb(108.863 kg).                                                        POSTOPERATIVE DIAGNOSIS:   OSTEOARTHRITIS RIGHT KNEE                                                                      PROCEDURE:  Procedure(s): TOTAL KNEE ARTHROPLASTY Using Depuy Sigma RP implants #3R Femur, #3Tibia, 10mm sigma RP bearing, 38 Patella     SURGEON: Trinady Milewski J    ASSISTANT:   Shirl Harris PA-C   (Present and scrubbed throughout the case, critical for assistance with exposure, retraction, instrumentation, and closure.)         ANESTHESIA: GET with Femoral Nerve Block  DRAINS: foley, 2 medium hemovac in knee   TOURNIQUET TIME:   COMPLICATIONS:  None     SPECIMENS: None   INDICATIONS FOR PROCEDURE: The patient has  OSTEOARTHRITIS RIGHT KNEE, varus deformities, XR shows bone on bone arthritis. Patient has failed all conservative measures including anti-inflammatory medicines, narcotics, attempts at  exercise and weight loss, cortisone injections and viscosupplementation.  Risks and benefits of surgery have been discussed, questions answered.   DESCRIPTION OF PROCEDURE: The patient identified by armband, received  right femoral nerve block and IV antibiotics, in the holding area at [facility]. Patient taken to the operating room, appropriate anesthetic  monitors were attached General endotracheal anesthesia induced with  the patient in supine position, Foley catheter was inserted. Tourniquet  applied high to the operative thigh. Lateral post and foot positioner  applied to the table, the lower extremity was then prepped and draped   in usual sterile fashion from the ankle to the tourniquet. Time-out procedure was performed. The limb was wrapped with an Esmarch bandage and the tourniquet inflated to 350 mmHg. We began the operation by making the anterior midline incision starting at handbreadth above the patella going over the patella 1 cm medial to and  4 cm distal to the tibial tubercle. Small bleeders in the skin and the  subcutaneous tissue identified and cauterized. Transverse retinaculum was incised and reflected medially and a medial parapatellar arthrotomy was accomplished. the patella was everted and theprepatellar fat pad resected. The superficial medial collateral  ligament was then elevated from anterior to posterior along the proximal  flare of the tibia and anterior half of the menisci resected. The knee was hyperflexed exposing bone on bone arthritis. Peripheral and notch osteophytes as well as the cruciate ligaments were then resected. We continued to  work our way  around posteriorly along the proximal tibia, and externally  rotated the tibia subluxing it out from underneath the femur. A McHale  retractor was placed through the notch and a lateral Hohmann retractor  placed, and we then drilled through the proximal tibia in line with the  axis of the tibia followed by an intramedullary guide rod and 2-degree  posterior slope cutting guide. The tibial cutting guide was pinned into place  allowing resection of 4 mm of bone medially and about 8 mm of bone  laterally because of her varus deformity. Satisfied with the tibial resection, we then  entered the distal femur 2 mm anterior to the PCL origin with the  intramedullary guide rod and applied the distal femoral cutting guide  set at 11mm, with 5 degrees of valgus. This was pinned along the  epicondylar axis. At this point, the distal femoral cut was accomplished without difficulty. We then sized for a #3R femoral component and pinned the guide in 3 degrees of  external rotation.The chamfer cutting guide was pinned into place. The anterior, posterior, and chamfer cuts were accomplished without difficulty followed by  the Sigma RP box cutting guide and the box cut. We also removed posterior osteophytes from the posterior femoral condyles. At this  time, the knee was brought into full extension. We checked our  extension and flexion gaps and found them symmetric at 10mm.  The patella thickness measured at 22 mm. We set the cutting guide at 13 and removed the posterior 9.5-10 mm  of the patella sized for 38 button and drilled the lollipop. The knee  was then once again hyperflexed exposing the proximal tibia. We sized for a #3 tibial base plate, applied the smokestack and the conical reamer followed by the the Delta fin keel punch. We then hammered into place the Sigma RP trial femoral component, inserted a 10-mm trial bearing, trial patellar button, and took the knee through range of motion from 0-130 degrees. No thumb pressure was required for patellar  tracking. At this point, all trial components were removed, a double batch of DePuy HV cement with 1500 mg of Zinacef was mixed and applied to all bony metallic mating surfaces except for the posterior condyles of the femur itself. In order, we  hammered into place the tibial tray and removed excess cement, the femoral component and removed excess cement, a 10-mm Sigma RP bearing  was inserted, and the knee brought to full extension with compression.  The patellar button was clamped into place, and excess cement  removed. While the cement cured the wound was irrigated out with normal saline solution pulse lavage, and medium Hemovac drains were placed from an anterolateral  approach. Ligament stability and patellar tracking were checked and found to be excellent. The parapatellar arthrotomy was closed with  running #1 Vicryl suture. The subcutaneous tissue with 0 and 2-0 undyed  Vicryl suture, and the skin with  skin staples. A dressing of Xeroform,  4 x 4, dressing sponges, Webril, and Ace wrap applied. The patient  awakened, extubated, and taken to recovery room without difficulty.   Heather Mckendree J 02/07/2012, 11:43 AM

## 2012-02-07 NOTE — Preoperative (Signed)
Beta Blockers   Reason not to administer Beta Blockers:Not Applicable 

## 2012-02-07 NOTE — Interval H&P Note (Signed)
History and Physical Interval Note:  02/07/2012 10:02 AM  Brittany Acosta  has presented today for surgery, with the diagnosis of OSTEOARTHRITIS RIGHT KNEE  The various methods of treatment have been discussed with the patient and family. After consideration of risks, benefits and other options for treatment, the patient has consented to  Procedure(s) (LRB) with comments: TOTAL KNEE ARTHROPLASTY (Right) as a surgical intervention .  The patient's history has been reviewed, patient examined, no change in status, stable for surgery.  I have reviewed the patient's chart and labs.  Questions were answered to the patient's satisfaction.     Nestor Lewandowsky

## 2012-02-07 NOTE — Progress Notes (Signed)
UR COMPLETED  

## 2012-02-08 ENCOUNTER — Encounter (HOSPITAL_COMMUNITY): Payer: Self-pay | Admitting: Orthopedic Surgery

## 2012-02-08 LAB — BASIC METABOLIC PANEL
BUN: 12 mg/dL (ref 6–23)
CO2: 26 mEq/L (ref 19–32)
Calcium: 8.3 mg/dL — ABNORMAL LOW (ref 8.4–10.5)
Chloride: 104 mEq/L (ref 96–112)
Creatinine, Ser: 0.76 mg/dL (ref 0.50–1.10)
GFR calc Af Amer: >90 mL/min (ref >90)
GFR calc non Af Amer: 89 mL/min — ABNORMAL LOW (ref >90)
Glucose, Bld: 144 mg/dL — ABNORMAL HIGH (ref 70–99)
Potassium: 3.6 mEq/L (ref 3.5–5.1)
Sodium: 137 mEq/L (ref 135–145)

## 2012-02-08 LAB — GLUCOSE, CAPILLARY
Glucose-Capillary: 136 mg/dL — ABNORMAL HIGH (ref 70–99)
Glucose-Capillary: 132 mg/dL — ABNORMAL HIGH (ref 70–99)
Glucose-Capillary: 132 mg/dL — ABNORMAL HIGH (ref 70–99)
Glucose-Capillary: 131 mg/dL — ABNORMAL HIGH (ref 70–99)

## 2012-02-08 LAB — CBC
HCT: 26.6 % — ABNORMAL LOW (ref 36.0–46.0)
Hemoglobin: 7.6 g/dL — ABNORMAL LOW (ref 12.0–15.0)
MCH: 20 pg — ABNORMAL LOW (ref 26.0–34.0)
MCHC: 28.6 g/dL — ABNORMAL LOW (ref 30.0–36.0)
MCV: 70 fL — ABNORMAL LOW (ref 78.0–100.0)
Platelets: 447 10*3/uL — ABNORMAL HIGH (ref 150–400)
RBC: 3.8 MIL/uL — ABNORMAL LOW (ref 3.87–5.11)
RDW: 26.6 % — ABNORMAL HIGH (ref 11.5–15.5)
WBC: 10.2 10*3/uL (ref 4.0–10.5)

## 2012-02-08 NOTE — Clinical Social Work Psychosocial (Signed)
     Clinical Social Work Department BRIEF PSYCHOSOCIAL ASSESSMENT 02/08/2012  Patient:  Brittany Acosta, Brittany Acosta     Account Number:  [id number]     Admit date:  02/07/2012  Clinical Social Worker:  Tiburcio Pea  Date/Time:  02/08/2012 04:36 PM  Referred by:  Physician  Date Referred:  02/08/2012 Referred for  SNF Placement   Other Referral:   Interview type:  Patient Other interview type:    PSYCHOSOCIAL DATA Living Status:  HUSBAND Admitted from facility:   Level of care:   Primary support name:  Brittany Acosta Primary support relationship to patient:  SPOUSE Degree of support available:   Strong support    CURRENT CONCERNS Current Concerns  Post-Acute Placement   Other Concerns:    SOCIAL WORK ASSESSMENT / PLAN Met with patient today- she is requesting short term SNF for rehab. She states taht she is a prior resident of [address] and would like to return there  at d/c. Spoke with Donne Hazel, Admissions- [facility]- she is aware of patient and states that they will have a bed available on Thursday 02/10/12.  Fl2 placed on chart for MD's signature. Will monitor for tentative d/c date.   Assessment/plan status:  Psychosocial Support/Ongoing Assessment of Needs Other assessment/ plan:   Information/referral to community resources:   SNF list deferred as she wants return to [facility]  Aftercare needs discussed and will be arranged by SNF at d/c.    PATIENTS/FAMILYS RESPONSE TO PLAN OF CARE: Patient is alert, oriented and very pleasant. She wants return to [facility] for short term rehab.  Facility agrees to accept patient when medically stable.  Patient states she will notify her husband who supports this d/c plan. Patient was appreciatve of CSW's assistance.

## 2012-02-08 NOTE — Progress Notes (Signed)
Orthopedic Tech Progress Note Patient Details:  Brittany Acosta 25-Dec-1950 [id number] Nurse called because CPM stopped working. Ortho Tech checked CPM and remote to machine is not turning on but machine sounds as if it is on when the power switch is flipped. There is possibly a short in the remote cord. Patient given another CPM but wasn't fitted at that time for it because she was eating. Nurse made aware that the next time patient is put on CPM it will have to be adjusted to fit. Nurse shown how to adjust machine with side knobs and foot plate.  Patient ID: Brittany Acosta, female   DOB: 02-15-1951, 61 y.o.   MRN: [id number]   [name] 02/08/2012, 5:00 PM

## 2012-02-08 NOTE — Progress Notes (Signed)
Patient ID: Brittany Acosta, female   DOB: [birth year], 61 y.o.   MRN: [id number] PATIENT ID: Brittany Acosta  MRN: [id number]  DOB/AGE:  [birth year] / 61 y.o.  1 Day Post-Op Procedure(s) (LRB): TOTAL KNEE ARTHROPLASTY (Right)    PROGRESS NOTE Subjective: Patient is alert, oriented, no Nausea, no Vomiting, yes passing gas, no Bowel Movement. Taking PO well. Denies SOB, Chest or Calf Pain. Using Incentive Spirometer, PAS in place. Ambulate wbat, CPM 0-30 Patient reports pain as 4 on 0-10 scale  .    Objective: Vital signs in last 24 hours: Filed Vitals:   02/07/12 1330 02/07/12 1600 02/07/12 2000 02/08/12 0616  BP: 131/65  135/66 136/68  Pulse: 83  85 80  Temp: 97.1 F (36.2 C)  97.6 F (36.4 C) 97.9 F (36.6 C)  TempSrc:      Resp: 16 16 18 18   SpO2: 100% 93% 95% 99%      Intake/Output from previous day: I/O last 3 completed shifts: In: 2970 [P.O.:120; I.V.:2850] Out: 1075 [Urine:700; Drains:300; Blood:75]   Intake/Output this shift: Total I/O In: -  Out: 1500 [Other:1500]   LABORATORY DATA:  Basename 02/07/12 2219 02/07/12 1544 02/07/12 1220  WBC -- -- --  HGB -- -- --  HCT -- -- --  PLT -- -- --  NA -- -- --  K -- -- --  CL -- -- --  CO2 -- -- --  BUN -- -- --  CREATININE -- -- --  GLUCOSE -- -- --  GLUCAP 258* 232* 180*  INR -- -- --  CALCIUM -- -- --    Examination: Neurologically intact ABD soft Neurovascular intact Sensation intact distally Intact pulses distally Dorsiflexion/Plantar flexion intact Incision: no drainage No cellulitis present Compartment soft}  Assessment:   1 Day Post-Op Procedure(s) (LRB): TOTAL KNEE ARTHROPLASTY (Right) ADDITIONAL DIAGNOSIS:  Morbid obesity, diabetes  Plan: PT/OT WBAT, CPM 5/hrs day until ROM 0-90 degrees, then D/C CPM DVT Prophylaxis:  SCDx72hrs, ASA 325 mg BID x 2 weeks DISCHARGE PLAN: Skilled Nursing Facility/Rehab DISCHARGE NEEDS: HHPT, HHRN, CPM, Walker and 3-in-1 comode seat     Skyler Dusing  J 02/08/2012, 7:14 AM

## 2012-02-08 NOTE — Evaluation (Signed)
Physical Therapy Evaluation Patient Details Name: Brittany Acosta MRN: [id number] DOB: [birth year] Today's Date: 02/08/2012 Time: 8469-6295 PT Time Calculation (min): 31 min  PT Assessment / Plan / Recommendation Clinical Impression  Pt is a 61 y/o female s/p L TKA.  Pt lives with spouse who is unable to assist with mobility secondary to health issues of caregiver.  Pt would benefit from SNF placement to maximize functional mobility for safe return to home.     PT Assessment  Patient needs continued PT services    Follow Up Recommendations  Post acute inpatient    Does the patient have the potential to tolerate intense rehabilitation   No, Recommend SNF  Barriers to Discharge Decreased caregiver support Spouse cannot provide assistance beyond supervision level     Equipment Recommendations  None recommended by PT    Recommendations for Other Services     Frequency 7X/week    Precautions / Restrictions Precautions Precautions: Fall;Knee Precaution Booklet Issued: No Precaution Comments: Educated pt on TKA position precautions.  Restrictions Weight Bearing Restrictions: No   Pertinent Vitals/Pain Pt reporting 4/10 pain in L Knee. Pt medicated prior to session.       Mobility  Bed Mobility Bed Mobility: Sit to Supine;Scooting to [facility] Rolling Left: Not tested (comment) Left Sidelying to Sit: Not tested (comment) Supine to Sit: Not tested (comment) Sitting - Scoot to Edge of Bed: Not tested (comment) Sit to Supine: 4: Min assist;HOB flat Scooting to HOB: 4: Min assist;With trapeze Details for Bed Mobility Assistance: Assist to manage L LE. Transfers Transfers: Sit to Stand;Stand to Sit Sit to Stand: 5: Supervision;With upper extremity assist;From chair/3-in-1 Stand to Sit: 5: Supervision;With upper extremity assist;To chair/3-in-1 Details for Transfer Assistance: Verbal cues for technque including hand and L LE placement.   Ambulation/Gait Ambulation/Gait Assistance: 5:  Supervision Ambulation Distance (Feet): 50 Feet Assistive device: Rolling walker Ambulation/Gait Assistance Details: Cues for gait sequencing, WBAT on L LE, and to maintain safe distance from walker.   Gait Pattern: Step-to pattern Gait velocity: WFL  General Gait Details: Pt pushes walker too far ahead causing pt to flex at the trunk.   Stairs: No Wheelchair Mobility Wheelchair Mobility: No    Shoulder Instructions     Exercises Total Joint Exercises Ankle Circles/Pumps: Both;10 reps;Supine Quad Sets: Left;10 reps;Supine   PT Diagnosis: Difficulty walking;Acute pain;Abnormality of gait;Generalized weakness  PT Problem List:   PT Treatment Interventions: DME instruction;Gait training;Stair training;Functional mobility training;Therapeutic activities;Therapeutic exercise;Patient/family education   PT Goals Acute Rehab PT Goals PT Goal Formulation: With patient Time For Goal Achievement: 02/15/12 Potential to Achieve Goals: Good Pt will go Supine/Side to Sit: with modified independence PT Goal: Supine/Side to Sit - Progress: Goal set today Pt will go Sit to Supine/Side: with modified independence PT Goal: Sit to Supine/Side - Progress: Goal set today Pt will Transfer Bed to Chair/Chair to Bed: with modified independence PT Transfer Goal: Bed to Chair/Chair to Bed - Progress: Goal set today Pt will Ambulate: >150 feet;with modified independence;with rolling walker PT Goal: Ambulate - Progress: Goal set today Pt will Go Up / Down Stairs: 1-2 stairs;with supervision;with rolling walker PT Goal: Up/Down Stairs - Progress: Goal set today Pt will Perform Home Exercise Program: Independently PT Goal: Perform Home Exercise Program - Progress: Goal set today  Visit Information  Last PT Received On: 02/08/12 Assistance Needed: +1    Subjective Data  Subjective: Agree to PT Eval.  Patient Stated Goal: Ambulate without pain.  Prior Functioning  Home Living Lives With:  Spouse Available Help at Discharge: Skilled Nursing Facility Type of Home: House Home Access: Stairs to enter Secretary/administrator of Steps: 2 Entrance Stairs-Rails: None Home Layout: One level Bathroom Shower/Tub: Health visitor: Standard Bathroom Accessibility: Yes Home Adaptive Equipment: Walker - rolling;Bedside commode/3-in-1 Additional Comments: d/c SNF planned Prior Function Level of Independence: Independent Able to Take Stairs?: Yes Driving: Yes Communication Communication: No difficulties Dominant Hand: Right    Cognition  Overall Cognitive Status: Appears within functional limits for tasks assessed/performed Arousal/Alertness: Awake/alert Orientation Level: Appears intact for tasks assessed Behavior During Session: [facility] for tasks performed    Extremity/Trunk Assessment Right Upper Extremity Assessment RUE ROM/Strength/Tone: Deficits RUE ROM/Strength/Tone Deficits: recent shoulder surg in September and s/p fall on Rt shoulder. AROM ~ 20 degress PROM 90 degrees. shoulder evaluation limited due to no details regarding ROM restrictions in chart and patient unable to recall any. Pt abduction Rt shoulder ~ 20 degrees. RUE Coordination: WFL - gross motor;WFL - fine motor Left Upper Extremity Assessment LUE ROM/Strength/Tone: Within functional levels Right Lower Extremity Assessment RLE ROM/Strength/Tone: Within functional levels Left Lower Extremity Assessment LLE ROM/Strength/Tone: Deficits LLE ROM/Strength/Tone Deficits: ROM and strength in knee and hip limited secondary to surgery. Trunk Assessment Trunk Assessment: Normal   Balance High Level Balance High Level Balance Activites: Turns;Other (comment) (bil hand use at sink) High Level Balance Comments: pt is able to complete static standing and dynamic reaching within 5 inches of BOS  End of Session PT - End of Session Equipment Utilized During Treatment: Gait belt CPM Right Knee CPM Right Knee:  On Right Knee Flexion (Degrees): 50  Right Knee Extension (Degrees): 0   GP     [name] 02/08/2012, 1:41 PM [name] L. Wauneta Silveria DPT [phone]

## 2012-02-08 NOTE — Evaluation (Signed)
Occupational Therapy Evaluation Patient Details Name: Brittany Acosta MRN: [id number] DOB: 1950/09/19 Today's Date: 02/08/2012 Time: 1478-2956 OT Time Calculation (min): 26 min  OT Assessment / Plan / Recommendation Clinical Impression  61 yo female s/p Rt TKA that could benefit from skilled OT and will defer all OT needs to snf level. OT to sign off acutely    OT Assessment  All further OT needs can be met in the next venue of care    Follow Up Recommendations  Skilled nursing facility    Barriers to Discharge      Equipment Recommendations  Rolling walker with 5" wheels;3 in 1 bedside comode    Recommendations for Other Services    Frequency       Precautions / Restrictions Precautions Precautions: Fall;Knee Restrictions Weight Bearing Restrictions: No   Pertinent Vitals/Pain none    ADL  Eating/Feeding: Performed;Independent Where Assessed - Eating/Feeding: Chair (poured water into cup and drink it) Grooming: Performed;Wash/dry hands;Wash/dry face;Teeth care;Denture care;Independent (with RN student just prior to arrival) Where Assessed - Grooming: Supported sitting (washed hands standing bil UE used) Upper Body Bathing: Performed;Chest;Right arm;Left arm;Abdomen;Modified independent Where Assessed - Upper Body Bathing: Supported sitting Lower Body Bathing: Performed;Moderate assistance (performed with RN student just prior to arrival) Where Assessed - Lower Body Bathing: Supported sitting Upper Body Dressing: Performed;Modified independent Where Assessed - Upper Body Dressing: Supported sitting Lower Body Dressing: Simulated Where Assessed - Lower Body Dressing: Unsupported sitting (Min (A) able to reach just above ankles) Toilet Transfer: Research scientist (life sciences) Method: Sit to Barista: Raised toilet seat with arms (or 3-in-1 over toilet) Toileting - Clothing Manipulation and Hygiene: Performed;Minimal assistance Where  Assessed - Engineer, mining and Hygiene: Sit to stand from 3-in-1 or toilet (pt requires sitting for hygiene) Tub/Shower Transfer:  (defer to snf) Equipment Used: Gait belt;Rolling walker Transfers/Ambulation Related to ADLs: Pt ambulated S level to rest room with decr gait velocity ADL Comments: pt completing bath on arrival with Environmental health practitioner. Pt with difficulty reaching LB supported sitting in bed. Pt completed bed mobility with Lt rail  MOD I. Pt completed restroom transfer and sink level grooming. Pt setup for grooming at chair as well due to oral care not addressed during RN student hygiene. Pt progressing well and will defer all other OT needs to SNF at this time    OT Diagnosis: Generalized weakness  OT Problem List: Decreased strength;Decreased activity tolerance;Impaired balance (sitting and/or standing);Decreased safety awareness;Decreased knowledge of use of DME or AE;Decreased knowledge of precautions OT Treatment Interventions:     OT Goals    Visit Information  Last OT Received On: 02/08/12 Assistance Needed: +1    Subjective Data  Subjective: "I fell and they scanned my shoulder on saturday"- Pt reports a fall and previous surg on Rt shoulder in September 2013 Patient Stated Goal: to go to [facility] for therapy   Prior Functioning     Home Living Lives With: Spouse Available Help at Discharge: Other (Comment) (limited so plans to d/c to [facility]) Additional Comments: d/c SNF planned Communication Communication: No difficulties Dominant Hand: Right         Vision/Perception     Cognition  Overall Cognitive Status: Appears within functional limits for tasks assessed/performed Arousal/Alertness: Awake/alert Orientation Level: Appears intact for tasks assessed Behavior During Session: [facility] for tasks performed    Extremity/Trunk Assessment Right Upper Extremity Assessment RUE ROM/Strength/Tone: Deficits RUE ROM/Strength/Tone Deficits: recent  shoulder surg in September and s/p fall  on Rt shoulder. AROM ~ 20 degress PROM 90 degrees. shoulder evaluation limited due to no details regarding ROM restrictions in chart and patient unable to recall any. Pt abduction Rt shoulder ~ 20 degrees. RUE Coordination: WFL - gross motor;WFL - fine motor Left Upper Extremity Assessment LUE ROM/Strength/Tone: Within functional levels Trunk Assessment Trunk Assessment: Normal     Mobility Bed Mobility Bed Mobility: Rolling Left;Left Sidelying to Sit;Supine to Sit;Sitting - Scoot to Edge of Bed Rolling Left: 6: Modified independent (Device/Increase time);With rail Left Sidelying to Sit: 6: Modified independent (Device/Increase time);With rails;HOB flat Supine to Sit: 6: Modified independent (Device/Increase time);With rails;HOB flat Sitting - Scoot to Edge of Bed: 6: Modified independent (Device/Increase time);With rail Details for Bed Mobility Assistance: pt requires use of the Lt hand rail Transfers Transfers: Sit to Stand;Stand to Sit Sit to Stand: 5: Supervision;With upper extremity assist;From bed Stand to Sit: 5: Supervision;With upper extremity assist;To chair/3-in-1 Details for Transfer Assistance: min v/c for RW safety during transfers     Shoulder Instructions     Exercise     Balance High Level Balance High Level Balance Activites: Turns;Other (comment) (bil hand use at sink) High Level Balance Comments: pt is able to complete static standing and dynamic reaching within 5 inches of BOS   End of Session OT - End of Session Activity Tolerance: Patient tolerated treatment well Patient left: in chair;with call bell/phone within reach Nurse Communication: Mobility status CPM Right Knee CPM Right Knee: Off Right Knee Flexion (Degrees): 60  Right Knee Extension (Degrees): 0   OT to defer all further needs to SNF at this time. OT to treat patient if d/c plan changes to Home.     Harrel Carina [facility] 02/08/2012, 11:22 AM Pager:  [phone]

## 2012-02-08 NOTE — Progress Notes (Signed)
CARE MANAGEMENT NOTE 02/08/2012  Patient:  Brittany Acosta, Brittany Acosta   Account Number:  [id number]  Date Initiated:  02/08/2012  Documentation initiated by:  Vance Peper  Subjective/Objective Assessment:   61 yr old female s/p right total knee arthroplasty.     Action/Plan:   patient will go for short term rehab at [facility]. Social Worker is aware.   Anticipated DC Date:  02/10/2012   Anticipated DC Plan:  SKILLED NURSING FACILITY  In-house referral  Clinical Social Worker      DC Planning Services  CM consult      Choice offered to / List presented to:             Status of service:  Completed, signed off Medicare Important Message given?   (If response is "NO", the following Medicare IM given date fields will be blank) Date Medicare IM given:   Date Additional Medicare IM given:    Discharge Disposition:  SKILLED NURSING FACILITY  Per UR Regulation:    If discussed at Long Length of Stay Meetings, dates discussed:    Comments:

## 2012-02-09 LAB — GLUCOSE, CAPILLARY
Glucose-Capillary: 131 mg/dL — ABNORMAL HIGH (ref 70–99)
Glucose-Capillary: 188 mg/dL — ABNORMAL HIGH (ref 70–99)
Glucose-Capillary: 114 mg/dL — ABNORMAL HIGH (ref 70–99)
Glucose-Capillary: 91 mg/dL (ref 70–99)

## 2012-02-09 LAB — CBC
HCT: 25.7 % — ABNORMAL LOW (ref 36.0–46.0)
Hemoglobin: 7.4 g/dL — ABNORMAL LOW (ref 12.0–15.0)
MCH: 20.4 pg — ABNORMAL LOW (ref 26.0–34.0)
MCHC: 28.8 g/dL — ABNORMAL LOW (ref 30.0–36.0)
MCV: 70.8 fL — ABNORMAL LOW (ref 78.0–100.0)
Platelets: 382 10*3/uL (ref 150–400)
RBC: 3.63 MIL/uL — ABNORMAL LOW (ref 3.87–5.11)
RDW: 27 % — ABNORMAL HIGH (ref 11.5–15.5)
WBC: 8.8 10*3/uL (ref 4.0–10.5)

## 2012-02-09 MED ORDER — BISACODYL 5 MG PO TBEC: 5.0000 mg | DELAYED_RELEASE_TABLET | Freq: Every day | ORAL | Status: DC | PRN

## 2012-02-09 MED ORDER — METHOCARBAMOL 500 MG PO TABS: 500.0000 mg | ORAL_TABLET | Freq: Four times a day (QID) | ORAL | Status: DC | PRN

## 2012-02-09 MED ORDER — INSULIN ASPART PROT & ASPART (70-30 MIX) 100 UNIT/ML ~~LOC~~ SUSP: 15.0000 [IU] | Freq: Two times a day (BID) | SUBCUTANEOUS | Status: DC

## 2012-02-09 MED ORDER — HYDROCODONE-ACETAMINOPHEN 5-325 MG PO TABS: 1.0000 | ORAL_TABLET | ORAL | Status: DC | PRN

## 2012-02-09 MED ORDER — ASPIRIN 325 MG PO TBEC: 325.0000 mg | DELAYED_RELEASE_TABLET | Freq: Two times a day (BID) | ORAL | Status: DC

## 2012-02-09 NOTE — Progress Notes (Signed)
Nutrition Brief Note  Patient identified on the Malnutrition Screening Tool (MST) Report  Pt's BMI is 43.2.  Pt meets criteria for morbidly obese based on current BMI.   Current diet order is CHO Modified Med, patient is consuming approximately 75% of meals at this time per her report. Labs and medications reviewed.  Pt reports she recently weighed 105 kg at a visit with her PCP.  Per chart review, pt wt has been stable at 245 lbs within 2 weeks before and after this visit with PCP suggesting variability within scales, calibration error, etc.  Pt states she believed the wt of 105 kg was accurate and reports "something was going on then, but I don't remember what."  Pt states she has been eating per her usual, denies activity, and denies noticeable wt change.   Pt wt of 247 lbs is currently consistent with her usual of 245 lbs.  No nutrition interventions warranted at this time. If nutrition issues arise, please consult RD.   Loyce Dys, MS RD LDN Clinical Inpatient Dietitian Pager: [phone] Weekend/After hours pager: [phone]

## 2012-02-09 NOTE — Progress Notes (Signed)
Physical Therapy Treatment Patient Details Name: Brittany Acosta MRN: [id number] DOB: 1950-11-16 Today's Date: 02/09/2012 Time: 4098-1191 PT Time Calculation (min): 26 min  PT Assessment / Plan / Recommendation Comments on Treatment Session  Patient progressing well with ambulation. Able to tolerate therex without difficulty. Planning on discharge to [facility] tomorrow    Follow Up Recommendations  SNF     Does the patient have the potential to tolerate intense rehabilitation     Barriers to Discharge        Equipment Recommendations  None recommended by PT    Recommendations for Other Services    Frequency 7X/week   Plan Discharge plan remains appropriate;Frequency remains appropriate    Precautions / Restrictions Precautions Precautions: Fall;Knee Restrictions Weight Bearing Restrictions: Yes LLE Weight Bearing: Weight bearing as tolerated   Pertinent Vitals/Pain     Mobility  Bed Mobility Bed Mobility: Not assessed Transfers Sit to Stand: 5: Supervision;With upper extremity assist;From chair/3-in-1 Stand to Sit: 5: Supervision;With upper extremity assist;To chair/3-in-1 Details for Transfer Assistance: Supervision for safety Ambulation/Gait Ambulation/Gait Assistance: 4: Min guard Ambulation Distance (Feet): 300 Feet Assistive device: Rolling walker Ambulation/Gait Assistance Details: Cues to stand tall Gait Pattern: Step-through pattern;Decreased stride length Gait velocity: [facility]    Exercises Total Joint Exercises Ankle Circles/Pumps: Both;10 reps;AROM Quad Sets: AROM;Left;15 reps Short Arc Quad: AROM;Left;15 reps Heel Slides: AAROM;Left;15 reps Hip ABduction/ADduction: AAROM;15 reps;Left Straight Leg Raises: AAROM;Left;15 reps   PT Diagnosis:    PT Problem List:   PT Treatment Interventions:     PT Goals Acute Rehab PT Goals PT Transfer Goal: Bed to Chair/Chair to Bed - Progress: Progressing toward goal PT Goal: Ambulate - Progress: Progressing  toward goal PT Goal: Perform Home Exercise Program - Progress: Progressing toward goal  Visit Information  Last PT Received On: 02/09/12 Assistance Needed: +1    Subjective Data      Cognition  Overall Cognitive Status: Appears within functional limits for tasks assessed/performed Arousal/Alertness: Awake/alert Orientation Level: Appears intact for tasks assessed Behavior During Session: [facility] for tasks performed    Balance     End of Session PT - End of Session Equipment Utilized During Treatment: Gait belt Activity Tolerance: Patient tolerated treatment well Patient left: in chair;with call bell/phone within reach Nurse Communication: Mobility status   GP     Fredrich Birks 02/09/2012, 11:01 AM 02/09/2012 Fredrich Birks PTA [phone] pager [phone] office

## 2012-02-09 NOTE — Progress Notes (Signed)
PATIENT ID: Brittany Acosta  MRN: [id number]  DOB/AGE:  1950-10-03 / 61 y.o.  2 Days Post-Op Procedure(s) (LRB): TOTAL KNEE ARTHROPLASTY (Right)    PROGRESS NOTE Subjective: Patient is alert, oriented, no Nausea, no Vomiting, yes passing gas, no Bowel Movement. Taking PO well. Denies SOB, Chest or Calf Pain. Using Incentive Spirometer, PAS in place. Ambulating slowly with PT. Patient reports pain as moderate  .    Objective: Vital signs in last 24 hours: Filed Vitals:   02/08/12 1405 02/08/12 1600 02/08/12 2015 02/09/12 0603  BP: 122/66  116/69 110/57  Pulse: 82  74 85  Temp: 97.5 F (36.4 C)  97.6 F (36.4 C) 97.8 F (36.6 C)  TempSrc:    Axillary  Resp: 18 16 17 18   SpO2: 100% 100% 100% 93%      Intake/Output from previous day: I/O last 3 completed shifts: In: 1620 [P.O.:120; I.V.:1500] Out: 1850 [Urine:200; Drains:150; Other:1500]   Intake/Output this shift:     LABORATORY DATA:  Basename 02/09/12 0642 02/09/12 0500 02/08/12 2158 02/08/12 1643 02/08/12 0615  WBC -- 8.8 -- -- 10.2  HGB -- 7.4* -- -- 7.6*  HCT -- 25.7* -- -- 26.6*  PLT -- 382 -- -- 447*  NA -- -- -- -- 137  K -- -- -- -- 3.6  CL -- -- -- -- 104  CO2 -- -- -- -- 26  BUN -- -- -- -- 12  CREATININE -- -- -- -- 0.76  GLUCOSE -- -- -- -- 144*  GLUCAP 131* -- 131* 132* --  INR -- -- -- -- --  CALCIUM -- -- -- -- 8.3*    Examination: Neurologically intact ABD soft Neurovascular intact Sensation intact distally Intact pulses distally Dorsiflexion/Plantar flexion intact Incision: dressing C/D/I}  Assessment:   2 Days Post-Op Procedure(s) (LRB): TOTAL KNEE ARTHROPLASTY (Right) ADDITIONAL DIAGNOSIS:  Acute Blood Loss Anemia - asymptomatic.  Will transfuse 1 unit PRBC if patient becomes symptomatic.    Plan: PT/OT WBAT, CPM 5/hrs day until ROM 0-90 degrees, then D/C CPM  Dressing change today. DVT Prophylaxis:  SCDx72hrs, ASA 325 mg BID x 2 weeks DISCHARGE PLAN: Skilled Nursing Facility/Rehab  when bed available. DISCHARGE NEEDS: HHPT, HHRN, CPM, Walker and 3-in-1 comode seat     Brittany Avis M. 02/09/2012, 8:14 AM

## 2012-02-09 NOTE — Clinical Social Work Placement (Signed)
    Clinical Social Work Department CLINICAL SOCIAL WORK PLACEMENT NOTE 02/09/2012  Patient:  Brittany Acosta, Brittany Acosta  Account Number:  [id number] Admit date:  02/07/2012  Clinical Social Worker:  Lupita Leash Abran Gavigan, LCSWA  Date/time:  02/08/2012 04:00 PM  Clinical Social Work is seeking post-discharge placement for this patient at the following level of care:   SKILLED NURSING   (*CSW will update this form in Epic as items are completed)     Patient/family provided with [facility] Department of Clinical Social Work's list of facilities offering this level of care within the geographic area requested by the patient (or if unable, by the patient's family).    Patient/family informed of their freedom to choose among providers that offer the needed level of care, that participate in Medicare, Medicaid or managed care program needed by the patient, have an available bed and are willing to accept the patient.    Patient/family informed of [facility]' ownership interest in [facility], as well as of the fact that they are under no obligation to receive care at this facility.  PASARR submitted to EDS on 02/08/2012 PASARR number received from EDS on 02/08/2012  FL2 transmitted to all facilities in geographic area requested by pt/family on  02/08/2012 FL2 transmitted to all facilities within larger geographic area on   Patient informed that his/her managed care company has contracts with or will negotiate with  certain facilities, including the following:   Patient is a past resident of [address] and is requesting return there.     Patient/family informed of bed offers received:  02/08/2012 Patient chooses bed at  [facility] Physician recommends and patient chooses bed at    Patient to be transferred to [facility]  on   02/10/12 Patient to be transferred to facility by [facility] (per patient's request)  The following physician request were entered in Epic:   Additional Comments:  02/10/12  Patient is pleased with d/c plan. She will notify her family. Notified SNF and pt's nurse of d/c. No further CSW needs identified. CSW signing off.  Lorri Frederick. [place]  [phone]

## 2012-02-10 DIAGNOSIS — M1711 Unilateral primary osteoarthritis, right knee: Secondary | ICD-10-CM | POA: Diagnosis present

## 2012-02-10 LAB — GLUCOSE, CAPILLARY
Glucose-Capillary: 179 mg/dL — ABNORMAL HIGH (ref 70–99)
Glucose-Capillary: 156 mg/dL — ABNORMAL HIGH (ref 70–99)
Glucose-Capillary: 190 mg/dL — ABNORMAL HIGH (ref 70–99)

## 2012-02-10 LAB — CBC
HCT: 30.7 % — ABNORMAL LOW (ref 36.0–46.0)
Hemoglobin: 9.2 g/dL — ABNORMAL LOW (ref 12.0–15.0)
MCH: 21 pg — ABNORMAL LOW (ref 26.0–34.0)
MCHC: 30 g/dL (ref 30.0–36.0)
MCV: 70.1 fL — ABNORMAL LOW (ref 78.0–100.0)
Platelets: 520 10*3/uL — ABNORMAL HIGH (ref 150–400)
RBC: 4.38 MIL/uL (ref 3.87–5.11)
RDW: 27.5 % — ABNORMAL HIGH (ref 11.5–15.5)
WBC: 11.5 10*3/uL — ABNORMAL HIGH (ref 4.0–10.5)

## 2012-02-10 NOTE — Progress Notes (Signed)
Physical Therapy Treatment Patient Details Name: Brittany Acosta MRN: [id number] DOB: 11-27-1950 Today's Date: 02/10/2012 Time: 7829-5621 PT Time Calculation (min): 25 min  PT Assessment / Plan / Recommendation Comments on Treatment Session  Patient progressing well. Plans to DC to [facility] this afternoon    Follow Up Recommendations  SNF     Does the patient have the potential to tolerate intense rehabilitation     Barriers to Discharge        Equipment Recommendations  None recommended by PT    Recommendations for Other Services    Frequency 7X/week   Plan Discharge plan remains appropriate;Frequency remains appropriate    Precautions / Restrictions Precautions Precautions: Fall;Knee Restrictions Weight Bearing Restrictions: No LLE Weight Bearing: Weight bearing as tolerated   Pertinent Vitals/Pain     Mobility  Bed Mobility Supine to Sit: 5: Supervision Sitting - Scoot to Edge of Bed: 5: Supervision Sit to Supine: 5: Supervision Transfers Sit to Stand: 5: Supervision;With upper extremity assist;From chair/3-in-1;From bed Stand to Sit: 5: Supervision;With upper extremity assist;To chair/3-in-1;To bed Ambulation/Gait Ambulation/Gait Assistance: 5: Supervision Ambulation Distance (Feet): 350 Feet Assistive device: Rolling walker Ambulation/Gait Assistance Details: Cues for posture Gait Pattern: Step-through pattern;Decreased stride length    Exercises Total Joint Exercises Quad Sets: AROM;Left;15 reps Heel Slides: AROM;Left;15 reps Hip ABduction/ADduction: AROM;Left;15 reps Straight Leg Raises: AROM;Left;15 reps Long Arc Quad: 15 reps;Left;AROM   PT Diagnosis:    PT Problem List:   PT Treatment Interventions:     PT Goals Acute Rehab PT Goals PT Goal: Supine/Side to Sit - Progress: Progressing toward goal PT Goal: Sit to Supine/Side - Progress: Progressing toward goal PT Transfer Goal: Bed to Chair/Chair to Bed - Progress: Progressing toward goal PT  Goal: Ambulate - Progress: Progressing toward goal  Visit Information  Last PT Received On: 02/10/12 Assistance Needed: +1    Subjective Data      Cognition  Overall Cognitive Status: Appears within functional limits for tasks assessed/performed Arousal/Alertness: Awake/alert Orientation Level: Appears intact for tasks assessed Behavior During Session: [facility] for tasks performed    Balance     End of Session PT - End of Session Equipment Utilized During Treatment: Gait belt Activity Tolerance: Patient tolerated treatment well Patient left: with call bell/phone within reach;in bed Nurse Communication: Mobility status CPM Right Knee CPM Right Knee: Off   GP     Fredrich Birks 02/10/2012, 10:40 AM 02/10/2012 Fredrich Birks PTA [phone] pager [phone] office

## 2012-02-10 NOTE — Discharge Summary (Signed)
Patient ID: Brittany Acosta MRN: [id number] DOB/AGE: [birth year] 61 y.o.  Admit date: 02/07/2012 Discharge date: 02/10/2012  Admission Diagnoses:  Principal Problem:  *Osteoarthritis of right knee   Discharge Diagnoses:  Same  Past Medical History  Diagnosis Date  . GOITER, MULTINODULAR   . HYPOTHYROIDISM   . HYPERCHOLESTEROLEMIA   . Morbid obesity   . BIPOLAR DISORDER UNSPECIFIED     depression  . Chronic diastolic heart failure   . ALLERGIC RHINITIS   . APHTHOUS STOMATITIS   . TRANSAMINASES, SERUM, ELEVATED   . ASYMPTOMATIC POSTMENOPAUSAL STATUS   . FIBROMYALGIA   . GERD (gastroesophageal reflux disease)   . History of wrist fracture     rt wrist  . Depression   . PONV (postoperative nausea and vomiting)   . Type II diabetes mellitus   . Neuropathy due to secondary diabetes   . Iron deficiency anemia   . Kidney stones     sees urologist @ [facility]  . External bleeding hemorrhoids   . Rheumatoid arthritis     knees and shoulders  . Fibromyalgia   . History of gout     "haven't had it in several years" (02/08/2012)  . PTSD (post-traumatic stress disorder)     Surgeries: Procedure(s): TOTAL KNEE ARTHROPLASTY on 02/07/2012   Consultants:    Discharged Condition: Improved  Hospital Course: Brittany Acosta is an 61 y.o. female who was admitted 02/07/2012 for operative treatment ofOsteoarthritis of right knee. Patient has severe unremitting pain that affects sleep, daily activities, and work/hobbies. After pre-op clearance the patient was taken to the operating room on 02/07/2012 and underwent  Procedure(s): TOTAL KNEE ARTHROPLASTY.    Patient was given perioperative antibiotics: Anti-infectives     Start     Dose/Rate Route Frequency Ordered Stop   02/07/12 1044   cefUROXime (ZINACEF) injection  Status:  Discontinued          As needed 02/07/12 1044 02/07/12 1204   02/06/12 1318   ceFAZolin (ANCEF) IVPB 2 g/50 mL premix        2 g 100 mL/hr over 30 Minutes Intravenous  60 min pre-op 02/06/12 1318 02/07/12 1007           Patient was given sequential compression devices, early ambulation, and chemoprophylaxis to prevent DVT.  Patient benefited maximally from hospital stay and there were no complications.    Recent vital signs: Patient Vitals for the past 24 hrs:  BP Temp Temp src Pulse Resp SpO2  Mar 04, 2012 2040 147/68 mmHg 97.9 F (36.6 C) Oral 93  18  98 %  03/04/12 1453 138/64 mmHg 98.1 F (36.7 C) - 68  19  99 %     Recent laboratory studies:  Basename 02/10/12 0621 03-04-2012 0500 02/08/12 0615  WBC 11.5* 8.8 --  HGB 9.2* 7.4* --  HCT 30.7* 25.7* --  PLT 520* 382 --  NA -- -- 137  K -- -- 3.6  CL -- -- 104  CO2 -- -- 26  BUN -- -- 12  CREATININE -- -- 0.76  GLUCOSE -- -- 144*  INR -- -- --  CALCIUM -- -- 8.3*     Discharge Medications:     Medication List     As of 02/10/2012 12:56 PM    STOP taking these medications         naproxen 500 MG tablet   Commonly known as: NAPROSYN      TAKE these medications  Alpha-Lipoic Acid 300 MG Tabs   Take 300 mg by mouth daily.      ARIPiprazole 20 MG tablet   Commonly known as: ABILIFY   Take 20 mg by mouth at bedtime.      aspirin 325 MG EC tablet   Take 1 tablet (325 mg total) by mouth 2 (two) times daily.      atorvastatin 20 MG tablet   Commonly known as: LIPITOR   Take 1 tablet (20 mg total) by mouth daily.      Biotin 10 MG Tabs   Take by mouth daily.      bisacodyl 5 MG EC tablet   Commonly known as: DULCOLAX   Take 1 tablet (5 mg total) by mouth daily as needed.      clonazePAM 1 MG tablet   Commonly known as: KLONOPIN   Take 2 mg by mouth at bedtime.      Co Q 10 60 MG Caps   Take 120 mg by mouth daily.      estrogens (conjugated) 0.3 MG tablet   Commonly known as: PREMARIN   Take 0.3 mg by mouth daily.      furosemide 40 MG tablet   Commonly known as: LASIX   Take 40 mg by mouth every evening.      gabapentin 300 MG capsule   Commonly known  as: NEURONTIN   Take 600 mg by mouth 3 (three) times daily.      glucose blood test strip   test blood sugar 2 times daily dx 250.01, and lancets and alcohol preps, 2/day      hydrochlorothiazide 25 MG tablet   Commonly known as: HYDRODIURIL   Take 25 mg by mouth every morning.      HYDROcodone-acetaminophen 5-325 MG per tablet   Commonly known as: NORCO/VICODIN   Take 1-2 tablets by mouth every 4 (four) hours as needed (breakthrough pain).      insulin aspart protamine-insulin aspart (70-30) 100 UNIT/ML injection   Commonly known as: NOVOLOG 70/30   Inject 15-35 Units into the skin 2 (two) times daily before lunch and supper. 35 units before breakfast, and 25 units before the evening meal      insulin aspart protamine-insulin aspart (70-30) 100 UNIT/ML injection   Commonly known as: NOVOLOG 70/30   Inject 25-35 Units into the skin 2 (two) times daily with a meal. Takes 35 units before breakfast and 25 units before supper      Insulin Pen Needle 31G X 5 MM Misc   1 each by Other route 2 (two) times daily.      Iron 325 (65 FE) MG Tabs   Take 325 mg by mouth every evening.      lamoTRIgine 200 MG tablet   Commonly known as: LAMICTAL   Take 200 mg by mouth daily at 12 noon.      levothyroxine 50 MCG tablet   Commonly known as: SYNTHROID, LEVOTHROID   Take 50 mcg by mouth daily.      Magnesium 250 MG Tabs   Take by mouth 2 (two) times daily.      Manganese Gluconate 50 MG Tabs   Take 50 mg by mouth every evening.      meloxicam 15 MG tablet   Commonly known as: MOBIC   Take 15 mg by mouth every evening.      methocarbamol 500 MG tablet   Commonly known as: ROBAXIN   Take 1 tablet (500 mg total) by  mouth every 6 (six) hours as needed.      mirtazapine 30 MG tablet   Commonly known as: REMERON   Take 30 mg by mouth at bedtime.      multivitamin with minerals Tabs   Take 1 tablet by mouth every evening.      NON FORMULARY   Hair Revitalizing formula take 2 tabs  daily      omeprazole 20 MG capsule   Commonly known as: PRILOSEC   Take 20 mg by mouth 2 (two) times daily.      oxybutynin 10 MG 24 hr tablet   Commonly known as: DITROPAN-XL   Take 10 mg by mouth every evening.      SEROQUEL XR 150 MG 24 hr tablet   Generic drug: QUEtiapine Fumarate   Take 150-450 mg by mouth 2 (two) times daily. Take 1 every morning and 3  At bedtime      thiamine 100 MG tablet   Commonly known as: VITAMIN B-1   Take 100 mg by mouth every evening.      topiramate 100 MG tablet   Commonly known as: TOPAMAX   Take 100 mg by mouth 2 (two) times daily.      ULTIMATE PROBIOTIC FORMULA PO   Take 1 capsule by mouth every evening.      UROCIT-K 10 10 MEQ (1080 MG) SR tablet   Generic drug: potassium citrate   Take 20 mEq by mouth 2 (two) times daily.      venlafaxine XR 150 MG 24 hr capsule   Commonly known as: EFFEXOR-XR   Take 150 mg by mouth every morning.      Vitamin D3 400 UNITS Caps   Take by mouth daily.        Diagnostic Studies: Dg Chest 2 View  02/04/2012  *RADIOLOGY REPORT*  Clinical Data: Preop for knee replacement.  CHEST - 2 VIEW  Comparison: 02/06/2011  Findings: Two views of the chest demonstrate clear lungs. Heart and mediastinum are within normal limits.  Trachea is midline.  Bony thorax is intact.  IMPRESSION: Normal examination.   Original Report Authenticated By: Richarda Overlie, M.D.    Ct Abdomen Pelvis W Contrast  01/21/2012  *RADIOLOGY REPORT*  Clinical Data: abdominal pain, nausea, vomiting, diarrhea  CT ABDOMEN AND PELVIS WITH CONTRAST  Technique:  Multidetector CT imaging of the abdomen and pelvis was performed following the standard protocol during bolus administration of intravenous contrast.  Contrast: OMNIPAQUE IOHEXOL 300 MG/ML  SOLN  Comparison: 01/05/2006  Findings: Sagittal images of the spine shows disc space flattening with vacuum disc phenomenon and mild anterior and posterior spurring at L5 S1 level.  Lung bases are  unremarkable.  Enhanced liver is unremarkable.  The patient is status post cholecystectomy.  Spleen and adrenal glands are unremarkable.  Partially fatty replaced pancreas again noted without change from prior exam.  Enhanced kidneys are symmetrical in size.  No hydronephrosis or hydroureter.  There is a stable cyst in lower pole of the right kidney measures 9 mm.  Exophytic cyst in the lower pole of the left kidney measures 2.3 cm increased in size from prior exam.  Stable cyst in the upper pole of the left kidney measures 1.9 cm.  Again noted a retroaortic left renal vein.  There is nonspecific minimal stranding of the central mesenteric fat surrounding the root of the mesentery.  This is stable from prior exam.  A central mesenteric lymph node measures 1.3 x 0.8 cm  without change from prior exam.  Delayed renal images shows bilateral renal symmetrical excretion. A mesenteric cyst measures five by 3.3 cm in axial image 38 in the right abdomen.  Stable in size and appearance from prior exam.  No small bowel obstruction.  No ascites or free air.  There is no pericecal inflammation. The patient is status post appendectomy.  The urinary bladder is unremarkable.  The patient is status post hysterectomy.  No distal colonic obstruction.  No inguinal adenopathy.  Mild degenerative changes SI joints.  Coronal images shows no destructive bony lesions within pelvis.  Bilateral visualized proximal ureter on delayed images is unremarkable.  IMPRESSION:  1.  No hydronephrosis or hydroureter.  Bilateral renal cysts. 2.  No small bowel obstruction. 3.  Stable mesenteric/peritoneal cyst in right abdomen. 4.  Status post cholecystectomy.  Status post appendectomy. 5.  No pericecal inflammation. 6.  Status post hysterectomy.   Original Report Authenticated By: Natasha Mead, M.D.     Disposition: 01-Home or Self Care      Discharge Orders    Future Appointments: Provider: Department: Dept Phone: Center:   03/07/2012 10:45 AM  Romero Belling, MD [facility] PRIMARY CARE ENDOCRINOLOGY [phone] None   05/04/2012 10:00 AM Romero Belling, MD [facility] PRIMARY CARE ENDOCRINOLOGY [phone] None   06/07/2012 10:45 AM Newt Lukes, MD [facility] Primary Care [facility] [phone] [facility]     Future Orders Please Complete By Expires   Increase activity slowly      Walker       May shower / Bathe      Driving Restrictions      Comments:   No driving for 2 weeks.   Change dressing (specify)      Comments:   Dressing change as needed.   Call MD for:  temperature >100.4      Call MD for:  severe uncontrolled pain      Call MD for:  redness, tenderness, or signs of infection (pain, swelling, redness, odor or green/yellow discharge around incision site)      Discharge instructions      Comments:   F/U with Dr. Turner Daniels as scheduled (POD #14)         Signed: Hazle Nordmann. 02/10/2012, 12:56 PM

## 2012-02-10 NOTE — Progress Notes (Signed)
Patient ID: KORRINE SICARD, female   DOB: Jan 06, 1951, 61 y.o.   MRN: [id number] PATIENT ID: DALORES WEGER  MRN: [id number]  DOB/AGE:  [birth year] / 61 y.o.  3 Days Post-Op Procedure(s) (LRB): TOTAL KNEE ARTHROPLASTY (Right)    PROGRESS NOTE Subjective: Patient is alert, oriented, no Nausea, no Vomiting, yes passing gas, no Bowel Movement. Taking PO well. Denies SOB, Chest or Calf Pain. Using Incentive Spirometer, PAS in place. Ambulate WBAT, CPM 0-50 Patient reports pain as 4 on 0-10 scale  .    Objective: Vital signs in last 24 hours: Filed Vitals:   02/09/12 0603 02/09/12 1100 02/09/12 1453 02/09/12 2040  BP: 110/57  138/64 147/68  Pulse: 85  68 93  Temp: 97.8 F (36.6 C)  98.1 F (36.7 C) 97.9 F (36.6 C)  TempSrc: Axillary   Oral  Resp: 18  19 18   Height:  5' 3.5" (1.613 m)    SpO2: 93%  99% 98%      Intake/Output from previous day: I/O last 3 completed shifts: In: 470 [P.O.:470] Out: 1700 [Urine:200; Other:1500]   Intake/Output this shift: Total I/O In: 240 [P.O.:240] Out: -    LABORATORY DATA:  Basename 02/09/12 2220 02/09/12 1724 02/09/12 1225 02/09/12 0500 02/08/12 0615  WBC -- -- -- 8.8 10.2  HGB -- -- -- 7.4* 7.6*  HCT -- -- -- 25.7* 26.6*  PLT -- -- -- 382 447*  NA -- -- -- -- 137  K -- -- -- -- 3.6  CL -- -- -- -- 104  CO2 -- -- -- -- 26  BUN -- -- -- -- 12  CREATININE -- -- -- -- 0.76  GLUCOSE -- -- -- -- 144*  GLUCAP 91 114* 188* -- --  INR -- -- -- -- --  CALCIUM -- -- -- -- 8.3*    Examination: Neurologically intact ABD soft Neurovascular intact Sensation intact distally Intact pulses distally Dorsiflexion/Plantar flexion intact Incision: no drainage No cellulitis present Compartment soft}  Assessment:   3 Days Post-Op Procedure(s) (LRB): TOTAL KNEE ARTHROPLASTY (Right) ADDITIONAL DIAGNOSIS:  Diabetes, morbid obesity  Plan: PT/OT WBAT, CPM 5/hrs day until ROM 0-90 degrees, then D/C CPM DVT Prophylaxis:  SCDx72hrs, ASA 325 mg BID x 2  weeks DISCHARGE PLAN: Skilled Nursing Facility/Rehab, [facility] DISCHARGE NEEDS: HHPT, HHRN, CPM, Walker and 3-in-1 comode seat     Madicyn Mesina J 02/10/2012, 6:14 AM

## 2012-03-01 ENCOUNTER — Other Ambulatory Visit: Payer: Self-pay | Admitting: Endocrinology

## 2012-03-07 ENCOUNTER — Ambulatory Visit: Payer: Medicare Other | Admitting: Internal Medicine

## 2012-03-07 ENCOUNTER — Ambulatory Visit (INDEPENDENT_AMBULATORY_CARE_PROVIDER_SITE_OTHER): Payer: Medicare Other | Admitting: Endocrinology

## 2012-03-07 ENCOUNTER — Encounter: Payer: Self-pay | Admitting: Endocrinology

## 2012-03-07 VITALS — BP 132/74 | HR 87 | Temp 97.6°F | Wt 254.0 lb

## 2012-03-07 DIAGNOSIS — E1065 Type 1 diabetes mellitus with hyperglycemia: Secondary | ICD-10-CM

## 2012-03-07 DIAGNOSIS — E1049 Type 1 diabetes mellitus with other diabetic neurological complication: Secondary | ICD-10-CM

## 2012-03-07 MED ORDER — INSULIN NPH (HUMAN) (ISOPHANE) 100 UNIT/ML ~~LOC~~ SUSP: SUBCUTANEOUS | Status: DC

## 2012-03-07 NOTE — Patient Instructions (Addendum)
Please increase the insulin to 35 units with breakfast, and 25 with the evening meal.   Please come back for a follow-up appointment in 2 months.   on this type of insulin, you should eat lunch no later than 4 hrs after breakfast.  Otherwise, your blood sugar could go low.   check your blood sugar twice a day.  vary the time of day when you check, between before the 3 meals, and at bedtime.  also check if you have symptoms of your blood sugar being too high or too low.  please keep a record of the readings and bring it to your next appointment here.  please call us sooner if your blood sugar goes below 70, or if it stays over 200.

## 2012-03-07 NOTE — Progress Notes (Signed)
Subjective:    Patient ID: Brittany Acosta, female    DOB: [birth year], 61 y.o.   MRN: [id number]  HPI Pt returns for f/u of insulin-requiring DM (dx'ed 2002, complicated by peripheral sensory neuropathy).  she brings a record of her cbg's which i have reviewed today. It varies from 69 (before lunch) to 200's (other times).  The lows before lunch happen even when she ets lunch by 11 AM.  She says cbg's are higher due to persistent pain since her recent right TKR. Past Medical History  Diagnosis Date  . GOITER, MULTINODULAR   . HYPOTHYROIDISM   . HYPERCHOLESTEROLEMIA   . Morbid obesity   . BIPOLAR DISORDER UNSPECIFIED     depression  . Chronic diastolic heart failure   . ALLERGIC RHINITIS   . APHTHOUS STOMATITIS   . TRANSAMINASES, SERUM, ELEVATED   . ASYMPTOMATIC POSTMENOPAUSAL STATUS   . FIBROMYALGIA   . GERD (gastroesophageal reflux disease)   . History of wrist fracture     rt wrist  . Depression   . PONV (postoperative nausea and vomiting)   . Type II diabetes mellitus   . Neuropathy due to secondary diabetes   . Iron deficiency anemia   . Kidney stones     sees urologist @ [facility]  . External bleeding hemorrhoids   . Rheumatoid arthritis     knees and shoulders  . Fibromyalgia   . History of gout     "haven't had it in several years" (02/08/2012)  . PTSD (post-traumatic stress disorder)     Past Surgical History  Procedure Date  . Cholecystectomy 1985  . Abdominal hysterectomy 1976  . Tubal ligation 1972  . Right nasal surgery 08/1988  . Right sinus removed 08/1989    tooth partial  . Percitania stone removed (l) kidney 1992  . Lithotripsy (l) kidney 1997  . Left cystoscopy  1990  . Thyroidectomy 04/22/2011    Procedure: THYROIDECTOMY;  Surgeon: Antony Contras, MD;  Location: [facility];  Service: ENT;  Laterality: N/A;  TOTAL THYROIDECOTMY  . Cataract extraction, bilateral 02/2011    epps  . Total knee arthroplasty 06/18/2011    Procedure: TOTAL KNEE ARTHROPLASTY;  Surgeon:  Nestor Lewandowsky, MD;  Location: [facility] OR;  Service: Orthopedics;  Laterality: Left;  DEPUY  . Total knee arthroplasty 02/07/2012    Procedure: TOTAL KNEE ARTHROPLASTY;  Surgeon: Nestor Lewandowsky, MD;  Location: [facility] OR;  Service: Orthopedics;  Laterality: Right;  . Incisional breast biopsy 2000    right  . Cardiac catheterization 2001    sees Dr Peter [place]il[place]  . Knee arthroscopy 08/2009    right  . Knee arthroscopy with medial menisectomy     left  . Excisional hemorrhoidectomy     "dr cut out in his office" (02/08/2012)  . Left oophorectomy 1980    History   Social History  . Marital Status: Married    Spouse Name: N/A    Number of Children: 2  . Years of Education: N/A   Occupational History  . disability    Social History Main Topics  . Smoking status: Former Smoker -- 1.0 packs/day for 6 years    Types: Cigarettes    Quit date: 04/06/1983  . Smokeless tobacco: Never Used     Comment: Married, lives with spouse. Disable- 2 grown kids-6 g-kids  . Alcohol Use: No  . Drug Use: No  . Sexually Active: Not Currently   Other Topics Concern  . Not on  file   Social History Narrative   Married, lives with spouse - 2 adult childrendiabled    Current Outpatient Prescriptions on File Prior to Visit  Medication Sig Dispense Refill  . Alpha-Lipoic Acid 300 MG TABS Take 300 mg by mouth daily.       . ARIPiprazole (ABILIFY) 20 MG tablet Take 20 mg by mouth at bedtime.       Marland Kitchen aspirin EC 325 MG EC tablet Take 1 tablet (325 mg total) by mouth 2 (two) times daily.  30 tablet  0  . atorvastatin (LIPITOR) 20 MG tablet Take 1 tablet (20 mg total) by mouth daily.  90 tablet  3  . Biotin 10 MG TABS Take by mouth daily.      . bisacodyl (DULCOLAX) 5 MG EC tablet Take 1 tablet (5 mg total) by mouth daily as needed.  30 tablet  0  . Cholecalciferol (VITAMIN D3) 400 UNITS CAPS Take by mouth daily.      . clonazePAM (KLONOPIN) 1 MG tablet Take 2 mg by mouth at bedtime.       . Coenzyme Q10 (CO Q 10) 60  MG CAPS Take 120 mg by mouth daily.       Marland Kitchen estrogens, conjugated, (PREMARIN) 0.3 MG tablet Take 0.3 mg by mouth daily.      . Ferrous Sulfate (IRON) 325 (65 FE) MG TABS Take 325 mg by mouth every evening.       . furosemide (LASIX) 40 MG tablet TAKE 1 TABLET DAILY  90 tablet  2  . gabapentin (NEURONTIN) 300 MG capsule Take 600 mg by mouth 3 (three) times daily.      Marland Kitchen glucose blood (ONE TOUCH ULTRA TEST) test strip test blood sugar 2 times daily dx 250.01, and lancets and alcohol preps, 2/day  200 each  3  . hydrochlorothiazide 25 MG tablet Take 25 mg by mouth every morning.       Marland Kitchen HYDROcodone-acetaminophen (NORCO/VICODIN) 5-325 MG per tablet Take 1-2 tablets by mouth every 4 (four) hours as needed (breakthrough pain).  60 tablet  0  . Insulin Pen Needle 31G X 5 MM MISC 1 each by Other route 2 (two) times daily.      . Lactobacillus (ULTIMATE PROBIOTIC FORMULA PO) Take 1 capsule by mouth every evening.       . lamoTRIgine (LAMICTAL) 200 MG tablet Take 200 mg by mouth daily at 12 noon.       Marland Kitchen levothyroxine (SYNTHROID, LEVOTHROID) 50 MCG tablet Take 50 mcg by mouth daily.      . Magnesium 250 MG TABS Take by mouth 2 (two) times daily.      . Manganese Gluconate 50 MG TABS Take 50 mg by mouth every evening.       . meloxicam (MOBIC) 15 MG tablet Take 15 mg by mouth every evening.       . methocarbamol (ROBAXIN) 500 MG tablet Take 1 tablet (500 mg total) by mouth every 6 (six) hours as needed.  60 tablet  0  . mirtazapine (REMERON) 30 MG tablet Take 30 mg by mouth at bedtime.        . Multiple Vitamin (MULTIVITAMIN WITH MINERALS) TABS Take 1 tablet by mouth every evening.      . NON FORMULARY Hair Revitalizing formula take 2 tabs daily      . omeprazole (PRILOSEC) 20 MG capsule Take 20 mg by mouth 2 (two) times daily.      Marland Kitchen oxybutynin (DITROPAN-XL) 10  MG 24 hr tablet Take 10 mg by mouth every evening.       . potassium citrate (UROCIT-K 10) 10 MEQ (1080 MG) SR tablet Take 20 mEq by mouth 2 (two)  times daily.       . QUEtiapine Fumarate (SEROQUEL XR) 150 MG TB24 Take 150-450 mg by mouth 2 (two) times daily. Take 1 every morning and 3  At bedtime      . Thiamine HCl (VITAMIN B-1) 100 MG tablet Take 100 mg by mouth every evening.       . topiramate (TOPAMAX) 100 MG tablet Take 100 mg by mouth 2 (two) times daily.       Marland Kitchen venlafaxine (EFFEXOR-XR) 150 MG 24 hr capsule Take 150 mg by mouth every morning.        . insulin NPH (HUMULIN N PEN) 100 UNIT/ML injection 30 units before breakfast, and 20 units before the evening meal  30 mL  12    No Known Allergies  Family History  Problem Relation Age of Onset  . Hypertension Mother   . Diabetes Father   . Hypertension Father   . Hyperlipidemia Father   . Heart attack Other   . Coronary artery disease Other     BP 132/74  Pulse 87  Temp 97.6 F (36.4 C) (Oral)  Wt 254 lb (115.214 kg)  SpO2 96%    Review of Systems Denies LOC    Objective:   Physical Exam VITAL SIGNS:  See vs page GENERAL: no distress PSYCH: Alert and oriented x 3.  Does not appear anxious nor depressed.   Lab Results  Component Value Date   HGBA1C 7.3* 12/23/2011      Assessment & Plan:  DM: Based on the pattern of her cbg's, she needs some adjustment in her therapy

## 2012-03-27 ENCOUNTER — Other Ambulatory Visit: Payer: Self-pay | Admitting: *Deleted

## 2012-03-27 MED ORDER — ATORVASTATIN CALCIUM 20 MG PO TABS: 20.0000 mg | ORAL_TABLET | Freq: Every day | ORAL | Status: DC

## 2012-03-27 MED ORDER — LEVOTHYROXINE SODIUM 50 MCG PO TABS: 50.0000 ug | ORAL_TABLET | Freq: Every day | ORAL | Status: DC

## 2012-03-28 ENCOUNTER — Other Ambulatory Visit: Payer: Self-pay | Admitting: *Deleted

## 2012-03-28 MED ORDER — OMEPRAZOLE 20 MG PO CPDR: 20.0000 mg | DELAYED_RELEASE_CAPSULE | Freq: Two times a day (BID) | ORAL | Status: DC

## 2012-03-30 ENCOUNTER — Encounter: Payer: Self-pay | Admitting: Internal Medicine

## 2012-03-30 ENCOUNTER — Ambulatory Visit (INDEPENDENT_AMBULATORY_CARE_PROVIDER_SITE_OTHER): Payer: Medicare Other | Admitting: Internal Medicine

## 2012-03-30 ENCOUNTER — Telehealth: Payer: Self-pay | Admitting: Internal Medicine

## 2012-03-30 VITALS — BP 124/68 | HR 82 | Temp 97.7°F | Ht 63.5 in | Wt 251.1 lb

## 2012-03-30 DIAGNOSIS — J019 Acute sinusitis, unspecified: Secondary | ICD-10-CM

## 2012-03-30 MED ORDER — PROMETHAZINE-PHENYLEPHRINE 6.25-5 MG/5ML PO SYRP: 5.0000 mL | ORAL_SOLUTION | ORAL | Status: DC | PRN

## 2012-03-30 MED ORDER — AMOXICILLIN-POT CLAVULANATE 875-125 MG PO TABS: 1.0000 | ORAL_TABLET | Freq: Two times a day (BID) | ORAL | Status: AC

## 2012-03-30 NOTE — Progress Notes (Signed)
  Subjective:    HPI  complains of head cold symptoms, ?sinusitus Onset >1 week ago, initially improved then relapsing and worse symptoms  First associated with rhinorrhea, sneezing, sore throat, mild headache and low grade fever Now sinus pressure and mild-mod nasal congestion, yellow-green discharge - hx same with sinus surg x 3 in past No relief with OTC meds Precipitated by sick contacts and weather change Planning shoulder surgery 04/04/12 -"i need to be better by then"  Past Medical History  Diagnosis Date  . GOITER, MULTINODULAR   . HYPOTHYROIDISM   . HYPERCHOLESTEROLEMIA   . Morbid obesity   . BIPOLAR DISORDER UNSPECIFIED     depression  . Chronic diastolic heart failure   . ALLERGIC RHINITIS   . APHTHOUS STOMATITIS   . TRANSAMINASES, SERUM, ELEVATED   . ASYMPTOMATIC POSTMENOPAUSAL STATUS   . FIBROMYALGIA   . GERD (gastroesophageal reflux disease)   . History of wrist fracture     rt wrist  . Depression   . PONV (postoperative nausea and vomiting)   . Type II diabetes mellitus   . Neuropathy due to secondary diabetes   . Iron deficiency anemia   . Kidney stones     sees urologist @ [facility]  . External bleeding hemorrhoids   . Rheumatoid arthritis     knees and shoulders  . Fibromyalgia   . History of gout     "haven't had it in several years" (02/08/2012)  . PTSD (post-traumatic stress disorder)     Review of Systems Constitutional: No night sweats, no unexpected weight change Pulmonary: No pleurisy or hemoptysis Cardiovascular: No chest pain or palpitations     Objective:   Physical Exam BP 124/68  Pulse 82  Temp 97.7 F (36.5 C) (Oral)  Ht 5' 3.5" (1.613 m)  Wt 251 lb 1.9 oz (113.907 kg)  BMI 43.79 kg/m2  SpO2 97% GEN: mildly ill appearing and audible head/chest congestion HENT: NCAT, mild sinus tenderness bilaterally, nares with thick discharge and turbinate swelling, oropharynx mild erythema and PND, no exudate Eyes: Vision grossly intact, no  conjunctivitis Lungs: Clear to auscultation without rhonchi or wheeze, no increased work of breathing Cardiovascular: Regular rate and rhythm, no bilateral edema  Lab Results  Component Value Date   WBC 11.5* 02/10/2012   HGB 9.2* 02/10/2012   HCT 30.7* 02/10/2012   PLT 520* 02/10/2012   GLUCOSE 144* 02/08/2012   CHOL 128 06/11/2011   TRIG 228.0* 06/11/2011   HDL 50.00 06/11/2011   LDLDIRECT 56.4 06/11/2011   LDLCALC 42 09/03/2008   ALT 22 01/21/2012   AST 23 01/21/2012   NA 137 02/08/2012   K 3.6 02/08/2012   CL 104 02/08/2012   CREATININE 0.76 02/08/2012   BUN 12 02/08/2012   CO2 26 02/08/2012   TSH 7.09* 12/09/2011   INR 1.01 02/04/2012   HGBA1C 7.3* 12/23/2011   MICROALBUR 0.2 12/24/2008      Assessment & Plan:  Viral URI > progression to acute sinusitis Cough, postnasal drip related to above L otalgia   Empiric antibiotics prescribed due to symptom duration greater than 7 days and progression despite OTC symptomatic care Prescription cough suppression - new prescriptions done Symptomatic care with Tylenol or Advil, decongestants, antihistamine, hydration and rest -  Saline irrigation and salt gargle advised as needed

## 2012-03-30 NOTE — Patient Instructions (Signed)
It was good to see you today. augmentin antibiotics and prescription cough syrup - Your prescription(s) have been submitted to your pharmacy. Please take as directed and contact our office if you believe you are having problem(s) with the medication(s). Alternate between ibuprofen and tylenol for aches, pain and fever symptoms as discussed Hydrate, rest and call if worse or unimproved

## 2012-03-30 NOTE — Telephone Encounter (Signed)
Patient Information:  Caller Name: Stevie  Phone: [phone]  Patient: Brittany Acosta, Brittany Acosta  Gender: Female  DOB: Sep 01, 1950  Age: 61 Years  PCP: Rene Paci (Adults only)  Office Follow Up:  Does the office need to follow up with this patient?: No  Instructions For The Office: N/A  RN Note:  Due to have shoulder surgery 04/04/12.  Reports hoarseness with yellow sinus drainage. Temp unknown/no thermometer; suspects afebrile. FBS 234. Left earache present.  Symptoms  Reason For Call & Symptoms: hoarseness and yellow sinus drainage  Reviewed Health History In EMR: Yes  Reviewed Medications In EMR: Yes  Reviewed Allergies In EMR: Yes  Reviewed Surgeries / Procedures: Yes  Date of Onset of Symptoms: 03/27/2012  Treatments Tried: zyrtec  Treatments Tried Worked: No  Guideline(s) Used:  Sinus Pain and Congestion  Disposition Per Guideline:   See Today in Office  Reason For Disposition Reached:   Earache  Advice Given:  Reassurance:   Sinus congestion is a normal part of a cold.  Usually home treatment with nasal washes can prevent an actual bacterial sinus infection.  Antibiotics are not helpful for the sinus congestion that occurs with colds.  Here is some care advice that should help.  For a Runny Nose With Profuse Discharge:  Nasal mucus and discharge helps to wash viruses and bacteria out of the nose and sinuses.  Blowing the nose is all that is needed.  For a Stuffy Nose - Use Nasal Washes:  Introduction: Saline (salt water) nasal irrigation (nasal wash) is an effective and simple home remedy for treating stuffy nose and sinus congestion. The nose can be irrigated by pouring, spraying, or squirting salt water into the nose and then letting it run back out.  Medicines for a Stuffy or Runny Nose:  Most cold medicines that are available over-the-counter (OTC) are not helpful.  Antihistamines: Are only helpful if you also have nasal allergies.  Caution - Nasal  Decongestants:  Do not take these medications if you have high blood pressure, heart disease, prostate problems, or an overactive thyroid.  Medicines for a Stuffy or Runny Nose:  Most cold medicines that are available over-the-counter (OTC) are not helpful.  Hydration:  Drink plenty of liquids (6-8 glasses of water daily). If the air in your home is dry, use a cool mist humidifier  Expected Course:  Sinus congestion from viral upper respiratory infections (colds) usually lasts 5-10 days.  Occasionally a cold can worsen and turn into bacterial sinusitis. Clues to this are sinus symptoms lasting longer than 10 days, fever lasting longer than 3 days, and worsening pain. Bacterial sinusitis may need antibiotic treatment.  Call Back If:   You become worse.  Appointment Scheduled:  03/30/2012 11:45:00 Appointment Scheduled Provider:  Rene Paci (Adults only)

## 2012-04-05 HISTORY — PX: CATARACT EXTRACTION: SUR2

## 2012-05-04 ENCOUNTER — Ambulatory Visit: Payer: Medicare Other | Admitting: Endocrinology

## 2012-05-17 ENCOUNTER — Telehealth: Payer: Self-pay | Admitting: Endocrinology

## 2012-05-17 MED ORDER — INSULIN PEN NEEDLE 31G X 5 MM MISC: 1.0000 | Freq: Two times a day (BID) | Status: DC

## 2012-05-17 NOTE — Telephone Encounter (Signed)
The patient called to request refill for BD Ultra fine pen needles 5mm x 31g.  The patient would like them sent to the [facility] on [address] in [place].  The patient may be reached at home at [phone] or on her cell at [phone] with any questions.

## 2012-05-19 ENCOUNTER — Telehealth: Payer: Self-pay | Admitting: Internal Medicine

## 2012-05-19 MED ORDER — SULFAMETHOXAZOLE-TRIMETHOPRIM 800-160 MG PO TABS: 1.0000 | ORAL_TABLET | Freq: Two times a day (BID) | ORAL | Status: DC

## 2012-05-19 NOTE — Telephone Encounter (Signed)
Patient called through the answering service: two day illnes with sinus drainage, chest congestion,low grade fever, sputum is green, she feels more short of breath. She doesn't have a way to get to [place] to attend Saturday clinic.  Plan She is advised that for increased SOB, persistent fever she will need to see a doctor  Septra DS bid x 7 days for possible bronchitis in a diabetic patient  Sudafed 30 mg twice a day  Robitussin DM sugar free every 4 hours as needed

## 2012-05-22 NOTE — Telephone Encounter (Signed)
Noted - I agree with advice and mgmt - and I appreciate the excellent care!

## 2012-06-04 ENCOUNTER — Other Ambulatory Visit: Payer: Self-pay | Admitting: Endocrinology

## 2012-06-07 ENCOUNTER — Encounter: Payer: Self-pay | Admitting: Endocrinology

## 2012-06-07 ENCOUNTER — Ambulatory Visit (INDEPENDENT_AMBULATORY_CARE_PROVIDER_SITE_OTHER): Payer: Medicare Other | Admitting: Endocrinology

## 2012-06-07 ENCOUNTER — Ambulatory Visit (INDEPENDENT_AMBULATORY_CARE_PROVIDER_SITE_OTHER): Payer: Medicare Other | Admitting: Internal Medicine

## 2012-06-07 ENCOUNTER — Other Ambulatory Visit (INDEPENDENT_AMBULATORY_CARE_PROVIDER_SITE_OTHER): Payer: Medicare Other

## 2012-06-07 ENCOUNTER — Encounter: Payer: Self-pay | Admitting: Internal Medicine

## 2012-06-07 VITALS — BP 130/80 | HR 70 | Temp 97.0°F | Wt 254.0 lb

## 2012-06-07 VITALS — BP 134/70 | HR 73 | Wt 253.0 lb

## 2012-06-07 DIAGNOSIS — R269 Unspecified abnormalities of gait and mobility: Secondary | ICD-10-CM

## 2012-06-07 DIAGNOSIS — D62 Acute posthemorrhagic anemia: Secondary | ICD-10-CM

## 2012-06-07 DIAGNOSIS — E1065 Type 1 diabetes mellitus with hyperglycemia: Secondary | ICD-10-CM

## 2012-06-07 DIAGNOSIS — E1049 Type 1 diabetes mellitus with other diabetic neurological complication: Secondary | ICD-10-CM

## 2012-06-07 DIAGNOSIS — R51 Headache: Secondary | ICD-10-CM

## 2012-06-07 LAB — CBC WITH DIFFERENTIAL/PLATELET
Basophils Absolute: 0 10*3/uL (ref 0.0–0.1)
Basophils Relative: 0.2 % (ref 0.0–3.0)
Eosinophils Absolute: 0.2 10*3/uL (ref 0.0–0.7)
Eosinophils Relative: 1.9 % (ref 0.0–5.0)
HCT: 37.7 % (ref 36.0–46.0)
Hemoglobin: 12.3 g/dL (ref 12.0–15.0)
Lymphocytes Relative: 25.9 % (ref 12.0–46.0)
Lymphs Abs: 2.5 10*3/uL (ref 0.7–4.0)
MCHC: 32.7 g/dL (ref 30.0–36.0)
MCV: 78.1 fl (ref 78.0–100.0)
Monocytes Absolute: 0.7 10*3/uL (ref 0.1–1.0)
Monocytes Relative: 6.7 % (ref 3.0–12.0)
Neutro Abs: 6.3 10*3/uL (ref 1.4–7.7)
Neutrophils Relative %: 65.3 % (ref 43.0–77.0)
Platelets: 304 10*3/uL (ref 150.0–400.0)
RBC: 4.82 Mil/uL (ref 3.87–5.11)
RDW: 19.4 % — ABNORMAL HIGH (ref 11.5–14.6)
WBC: 9.7 10*3/uL (ref 4.5–10.5)

## 2012-06-07 LAB — HEMOGLOBIN A1C: Hgb A1c MFr Bld: 8.9 % — ABNORMAL HIGH (ref 4.6–6.5)

## 2012-06-07 MED ORDER — INSULIN NPH (HUMAN) (ISOPHANE) 100 UNIT/ML ~~LOC~~ SUSP: 35.0000 [IU] | Freq: Two times a day (BID) | SUBCUTANEOUS | Status: DC

## 2012-06-07 NOTE — Assessment & Plan Note (Signed)
Reports increasing peripheral neuropathy symptoms and falls - refer to PT for gait training (see above) Follows with podiatry and endo for same -  on gabapentin since 12/2010 in addition to elavil  - continue same No ASA on coumadin, but on statin meds managed by endo - 70/30 only, metformin stopped 12/2011 Check a1c and titrate as needed  Lab Results  Component Value Date   HGBA1C 7.3* 12/23/2011

## 2012-06-07 NOTE — Progress Notes (Signed)
Subjective:    Patient ID: Brittany Acosta, female    DOB: [birth year], 62 y.o.   MRN: [id number]  HPI Pt returns for f/u of insulin-requiring DM (dx'ed 2002, complicated by peripheral sensory neuropathy; therapy limited by pt's need for a simple, inexpensive regimen; she has never had severe hypoglycemia or DKA).  no cbg record, but states cbg's are in the mid-100's.  There is no trend throughout the day, except it is lowest in the afternoon (90, 71). Past Medical History  Diagnosis Date  . GOITER, MULTINODULAR   . HYPOTHYROIDISM   . HYPERCHOLESTEROLEMIA   . Morbid obesity   . BIPOLAR DISORDER UNSPECIFIED     depression  . Chronic diastolic heart failure   . ALLERGIC RHINITIS   . APHTHOUS STOMATITIS   . TRANSAMINASES, SERUM, ELEVATED   . ASYMPTOMATIC POSTMENOPAUSAL STATUS   . FIBROMYALGIA   . GERD (gastroesophageal reflux disease)   . History of wrist fracture     rt wrist  . Depression   . PONV (postoperative nausea and vomiting)   . Type II diabetes mellitus   . Neuropathy due to secondary diabetes   . Iron deficiency anemia   . Kidney stones     sees urologist @ [facility]  . External bleeding hemorrhoids   . Rheumatoid arthritis     knees and shoulders  . Fibromyalgia   . History of gout     "haven't had it in several years" (02/08/2012)  . PTSD (post-traumatic stress disorder)     Past Surgical History  Procedure Laterality Date  . Cholecystectomy  1985  . Abdominal hysterectomy  1976  . Tubal ligation  1972  . Right nasal surgery  08/1988  . Right sinus removed  08/1989    tooth partial  . Percitania stone removed (l) kidney  1992  . Lithotripsy (l) kidney  1997  . Left cystoscopy   1990  . Thyroidectomy  04/22/2011    Procedure: THYROIDECTOMY;  Surgeon: Antony Contras, MD;  Location: [facility] OR;  Service: ENT;  Laterality: N/A;  TOTAL THYROIDECOTMY  . Cataract extraction, bilateral  02/2011    epps  . Total knee arthroplasty  06/18/2011    Procedure: TOTAL KNEE  ARTHROPLASTY;  Surgeon: Nestor Lewandowsky, MD;  Location: [facility] OR;  Service: Orthopedics;  Laterality: Left;  DEPUY  . Total knee arthroplasty  02/07/2012    Procedure: TOTAL KNEE ARTHROPLASTY;  Surgeon: Nestor Lewandowsky, MD;  Location: [facility] OR;  Service: Orthopedics;  Laterality: Right;  . Incisional breast biopsy  2000    right  . Cardiac catheterization  2001    sees Dr Peter [place]  . Knee arthroscopy  08/2009    right  . Knee arthroscopy with medial menisectomy      left  . Excisional hemorrhoidectomy      "dr cut out in his office" (02/08/2012)  . Left oophorectomy  1980    History   Social History  . Marital Status: Married    Spouse Name: N/A    Number of Children: 2  . Years of Education: N/A   Occupational History  . disability    Social History Main Topics  . Smoking status: Former Smoker -- 1.00 packs/day for 6 years    Types: Cigarettes    Quit date: 04/06/1983  . Smokeless tobacco: Never Used     Comment: Married, lives with spouse. Disable- 2 grown kids-6 g-kids  . Alcohol Use: No  . Drug Use: No  .  Sexually Active: Not Currently   Other Topics Concern  . Not on file   Social History Narrative   Married, lives with spouse - 2 adult children   diabled    Current Outpatient Prescriptions on File Prior to Visit  Medication Sig Dispense Refill  . Alpha-Lipoic Acid 300 MG TABS Take 300 mg by mouth daily.       . ARIPiprazole (ABILIFY) 20 MG tablet Take 20 mg by mouth at bedtime.       Marland Kitchen aspirin EC 325 MG EC tablet Take 1 tablet (325 mg total) by mouth 2 (two) times daily.  30 tablet  0  . atorvastatin (LIPITOR) 20 MG tablet Take 1 tablet (20 mg total) by mouth daily.  90 tablet  3  . Cholecalciferol (VITAMIN D3) 400 UNITS CAPS Take 400 Units by mouth 2 (two) times daily.       . clonazePAM (KLONOPIN) 1 MG tablet Take 2 mg by mouth at bedtime.       . Coenzyme Q10 120 MG CAPS Take 120 mg by mouth daily.      Marland Kitchen estrogens, conjugated, (PREMARIN) 0.3 MG tablet Take 0.3  mg by mouth daily.      . Ferrous Sulfate (IRON) 325 (65 FE) MG TABS Take 325 mg by mouth every evening.       . furosemide (LASIX) 40 MG tablet TAKE 1 TABLET DAILY  90 tablet  2  . gabapentin (NEURONTIN) 300 MG capsule Take 600 mg by mouth 3 (three) times daily.      Marland Kitchen glucose blood (ONE TOUCH ULTRA TEST) test strip test blood sugar 2 times daily dx 250.01, and lancets and alcohol preps, 2/day  200 each  3  . hydrochlorothiazide 25 MG tablet Take 25 mg by mouth every morning.       . indomethacin (INDOCIN SR) 75 MG CR capsule Take 1 capsule (75 mg total) by mouth 2 (two) times daily with a meal.      . Insulin Pen Needle 31G X 5 MM MISC 1 each by Other route 2 (two) times daily.  100 each  6  . Lactobacillus (ULTIMATE PROBIOTIC FORMULA PO) Take 1 capsule by mouth every evening.       . lamoTRIgine (LAMICTAL) 200 MG tablet Take 200 mg by mouth daily at 12 noon.       Marland Kitchen levothyroxine (SYNTHROID, LEVOTHROID) 50 MCG tablet Take 1 tablet (50 mcg total) by mouth daily.  90 tablet  1  . Magnesium 200 MG TABS Take 200 mg by mouth daily.      . Manganese Gluconate 50 MG TABS Take 50 mg by mouth every evening.       . Multiple Vitamin (MULTIVITAMIN WITH MINERALS) TABS Take 1 tablet by mouth every evening.      . NON FORMULARY Hair Revitalizing formula take 2 tabs daily      . omeprazole (PRILOSEC) 20 MG capsule Take 1 capsule (20 mg total) by mouth 2 (two) times daily.  180 capsule  1  . oxybutynin (DITROPAN-XL) 10 MG 24 hr tablet Take 10 mg by mouth every evening.       . potassium citrate (UROCIT-K 10) 10 MEQ (1080 MG) SR tablet Take 20 mEq by mouth 2 (two) times daily.       . QUEtiapine Fumarate (SEROQUEL XR) 150 MG TB24 Take 150-450 mg by mouth 2 (two) times daily. Take 1 every morning and 3  At bedtime      .  Thiamine HCl (VITAMIN B-1) 100 MG tablet Take 100 mg by mouth every evening.       . venlafaxine (EFFEXOR-XR) 150 MG 24 hr capsule Take 150 mg by mouth every morning.         No current  facility-administered medications on file prior to visit.    No Known Allergies  Family History  Problem Relation Age of Onset  . Hypertension Mother   . Diabetes Father   . Hypertension Father   . Hyperlipidemia Father   . Heart attack Other   . Coronary artery disease Other     BP 134/70  Pulse 73  Wt 253 lb (114.76 kg)  BMI 44.11 kg/m2  SpO2 98%   Review of Systems denies hypoglycemia    Objective:   Physical Exam VITAL SIGNS:  See vs page GENERAL: no distress SKIN:  Insulin injection sites at the anterior abdomen are normal.       Lab Results  Component Value Date   HGBA1C 8.9* 06/07/2012      Assessment & Plan:  DM: needs increased rx

## 2012-06-07 NOTE — Progress Notes (Signed)
Subjective:    Patient ID: Brittany Acosta, female    DOB: 1950/09/07, 62 y.o.   MRN: [id number]  HPI Type 1 Diabetes- Patient presents today for a f/u of Type 1 Diabetes. She is currently monitoring her blood glucose bid. She takes 30 units of insulin NPH in the morning and 20 units of same at dinner. She denies hyperglycemic episodes and states she may have some hypoglycemia symptoms but is unsure. She does feel nauseated at times. She denies any weakness, shakiness, sweating or confusion. She perceives her diabetes as being "pretty well controlled".   Headache- Headache began on Friday and has been constant ever since. The pain is located on the right side of the forehead and was precipitated when medications of Topamax and Remeron were stopped by her psychiatrist. She has tried alka-selzter and BC Powder to relieve her symptoms, but neither have provided significant relief. She denies nausea, vomiting, and weakness.   Gout- Gout flair noticed last week. Patient began taking indomethacin and does say it is helping. She denies side effects of the medications.   Instability- Patient reports repeated falls over the last year, two of which requiring orthopaedic intervention after sustaining an injury to knee and shoulder. Falls occur after getting out of bed or out of a seated position. She states she becomes dizzy and then falls.   Acute Blood Loss Anemia- Occurred secondary to orthopaedic surgery in December. Has been taking iron supplements since that time. She would like to know when these supplements can be stopped.   Past Medical History  Diagnosis Date  . GOITER, MULTINODULAR   . HYPOTHYROIDISM   . HYPERCHOLESTEROLEMIA   . Morbid obesity   . BIPOLAR DISORDER UNSPECIFIED     depression  . Chronic diastolic heart failure   . ALLERGIC RHINITIS   . APHTHOUS STOMATITIS   . TRANSAMINASES, SERUM, ELEVATED   . ASYMPTOMATIC POSTMENOPAUSAL STATUS   . FIBROMYALGIA   . GERD (gastroesophageal  reflux disease)   . History of wrist fracture     rt wrist  . Depression   . PONV (postoperative nausea and vomiting)   . Type II diabetes mellitus   . Neuropathy due to secondary diabetes   . Iron deficiency anemia   . Kidney stones     sees urologist @ [facility]  . External bleeding hemorrhoids   . Rheumatoid arthritis     knees and shoulders  . Fibromyalgia   . History of gout     "haven't had it in several years" (02/08/2012)  . PTSD (post-traumatic stress disorder)     Review of Systems  Constitutional: Negative for fever and unexpected weight change.  Respiratory: Negative for chest tightness, shortness of breath and wheezing.   Cardiovascular: Negative for chest pain and palpitations.  Gastrointestinal: Negative for nausea, vomiting, diarrhea and constipation.  Endocrine: Negative for polydipsia, polyphagia and polyuria.  Neurological: Positive for dizziness (upon standing) and headaches. Negative for syncope, speech difficulty, weakness, light-headedness and numbness.  Psychiatric/Behavioral: Positive for dysphoric mood.       Objective:   Physical Exam  Constitutional: She is oriented to person, place, and time. She appears well-developed and well-nourished. No distress.  Obese.  HENT:  Head: Normocephalic and atraumatic.  Mouth/Throat: Oropharynx is clear and moist. No oropharyngeal exudate.  Eyes: Conjunctivae and EOM are normal. Pupils are equal, round, and reactive to light. Right eye exhibits no discharge. Left eye exhibits no discharge. No scleral icterus.  Neck: Normal range of motion. Neck supple.  Cardiovascular: Normal rate and regular rhythm.  Exam reveals no gallop and no friction rub.   No murmur heard. Pulmonary/Chest: Effort normal and breath sounds normal. No respiratory distress. She has no wheezes.  Neurological: She is alert and oriented to person, place, and time. She has normal strength. No cranial nerve deficit. She displays a negative Romberg sign.  Coordination and gait normal.  Skin: Skin is warm and dry. No rash noted.  Psychiatric: Her behavior is normal. Judgment and thought content normal.  Dysphoric mood noted today.    Filed Vitals:   06/07/12 1103  BP: 130/80  Pulse: 70  Temp: 97 F (36.1 C)   Lab Results  Component Value Date   WBC 11.5* 02/10/2012   HGB 9.2* 02/10/2012   HCT 30.7* 02/10/2012   PLT 520* 02/10/2012   GLUCOSE 144* 02/08/2012   CHOL 128 06/11/2011   TRIG 228.0* 06/11/2011   HDL 50.00 06/11/2011   LDLDIRECT 56.4 06/11/2011   LDLCALC 42 09/03/2008   ALT 22 01/21/2012   AST 23 01/21/2012   NA 137 02/08/2012   K 3.6 02/08/2012   CL 104 02/08/2012   CREATININE 0.76 02/08/2012   BUN 12 02/08/2012   CO2 26 02/08/2012   TSH 7.09* 12/09/2011   INR 1.01 02/04/2012   HGBA1C 7.3* 12/23/2011   MICROALBUR 0.2 12/24/2008       Assessment & Plan:  Type 1 Diabetes- A1C recheck today. Medication changes will be made if indicated. Work with endo on same as ongoing Headache- Patient to try Tylenol to control pain. If pain continues or gets worse call office for another appointment.  Gout- Continue Indomethacin treatment. Symptoms controlled at present time.  Instability- Referral made for PT for gait assessment.   Acute Blood Loss Anemia- Check CBC today to evaluate if iron therapy is still needed. Will stop iron supplements if indicated.  Will return in 6 months for follow up or sooner if needed.   Jennifer Little PA-S  I have personally reviewed this case with PA student. I also personally examined this patient. I agree with history and findings as documented above. I reviewed, discussed and approve of the assessment and plan as listed above. Rene Paci, MD

## 2012-06-07 NOTE — Progress Notes (Signed)
Subjective:    Patient ID: Brittany Acosta, female    DOB: 05-29-1950, 62 y.o.   MRN: [id number]  HPI   Here for follow up - reviewed chronic medical issues  Past Medical History  Diagnosis Date  . GOITER, MULTINODULAR   . HYPOTHYROIDISM   . HYPERCHOLESTEROLEMIA   . Morbid obesity   . BIPOLAR DISORDER UNSPECIFIED     depression  . Chronic diastolic heart failure   . ALLERGIC RHINITIS   . APHTHOUS STOMATITIS   . TRANSAMINASES, SERUM, ELEVATED   . ASYMPTOMATIC POSTMENOPAUSAL STATUS   . FIBROMYALGIA   . GERD (gastroesophageal reflux disease)   . History of wrist fracture     rt wrist  . Depression   . PONV (postoperative nausea and vomiting)   . Type II diabetes mellitus   . Neuropathy due to secondary diabetes   . Iron deficiency anemia   . Kidney stones     sees urologist @ [facility]  . External bleeding hemorrhoids   . Rheumatoid arthritis     knees and shoulders  . Fibromyalgia   . History of gout     "haven't had it in several years" (02/08/2012)  . PTSD (post-traumatic stress disorder)     Review of Systems  Constitutional: Negative for fever and fatigue.  Respiratory: Negative for cough and shortness of breath.   Cardiovascular: Negative for chest pain and palpitations.       Objective:   Physical Exam  BP 130/80  Pulse 70  Temp(Src) 97 F (36.1 C) (Oral)  Wt 254 lb (115.214 kg)  BMI 44.28 kg/m2  SpO2 95% Wt Readings from Last 3 Encounters:  06/07/12 254 lb (115.214 kg)  03/30/12 251 lb 1.9 oz (113.907 kg)  03/07/12 254 lb (115.214 kg)   Constitutional: She appears well-developed and well-nourished. No distress.  Neck: Normal range of motion. Neck supple. No JVD present. No thyromegaly present.  Cardiovascular: Normal rate, regular rhythm and normal heart sounds.  No murmur heard. No BLE edema. Pulmonary/Chest: Effort normal and breath sounds normal. No respiratory distress. She has no wheezes.  Musculoskeletal: Normal range of motion, no joint  effusions. No gross deformities Neurological: She is alert and oriented to person, place, and time. No cranial nerve deficit. Coordination normal.  Skin: Skin is warm and dry. No rash noted. No erythema.  Psychiatric: She has a normal mood and affect. Her behavior is normal. Judgment and thought content normal.   Lab Results  Component Value Date   WBC 11.5* 02/10/2012   HGB 9.2* 02/10/2012   HCT 30.7* 02/10/2012   PLT 520* 02/10/2012   GLUCOSE 144* 02/08/2012   CHOL 128 06/11/2011   TRIG 228.0* 06/11/2011   HDL 50.00 06/11/2011   LDLDIRECT 56.4 06/11/2011   LDLCALC 42 09/03/2008   ALT 22 01/21/2012   AST 23 01/21/2012   NA 137 02/08/2012   K 3.6 02/08/2012   CL 104 02/08/2012   CREATININE 0.76 02/08/2012   BUN 12 02/08/2012   CO2 26 02/08/2012   TSH 7.09* 12/09/2011   INR 1.01 02/04/2012   HGBA1C 7.3* 12/23/2011   MICROALBUR 0.2 12/24/2008       Assessment & Plan:  See problem list. Medications and labs reviewed today.  headache  -neuro exam benign - likely related recent med changes - recommended tylenol otc prn and reassurance provided  Gait disorder with frequent falls - suspect contribution of peripheral neuropathy - refer to PT for gait training  ABL anemia - related to  shoulder surgery - on iron - recheck now and stop iron if normalized

## 2012-06-07 NOTE — Patient Instructions (Addendum)
It was good to see you today. We have reviewed your prior records including labs and tests today Test(s) ordered today. Your results will be released to MyChart (or called to you) after review, usually within 72hours after test completion. If any changes need to be made, you will be notified at that same time. we'll make referral to physical therapy for balance and gait training . Our office will contact you regarding appointment(s) once made. Medications reviewed and updated, no changes at this time.  Try using Tylenol as needed for headache symptoms and let us know if the symptoms are worse or unimproved Continue to work with other specialists as ongoing Please schedule followup in 6 months, call sooner if problems.

## 2012-06-07 NOTE — Patient Instructions (Addendum)
Please increase the insulin to 35 units twice a day (with breakfast, and with the evening meal).   Please come back for a follow-up appointment in 3 months.   on this type of insulin, you should eat lunch no later than 4 hrs after breakfast.  Otherwise, your blood sugar could go low.   check your blood sugar twice a day.  vary the time of day when you check, between before the 3 meals, and at bedtime.  also check if you have symptoms of your blood sugar being too high or too low.  please keep a record of the readings and bring it to your next appointment here.  please call us sooner if your blood sugar goes below 70, or if it stays over 200.

## 2012-06-13 ENCOUNTER — Telehealth: Payer: Self-pay | Admitting: Endocrinology

## 2012-06-13 NOTE — Telephone Encounter (Signed)
insulin should be the pens not the vial per patient. Please call Kelli at [facility] # [phone]./ Sherri S.

## 2012-06-13 NOTE — Telephone Encounter (Signed)
Per pharmacist they have correct rx

## 2012-06-19 ENCOUNTER — Telehealth: Payer: Self-pay | Admitting: Endocrinology

## 2012-06-19 MED ORDER — INSULIN NPH (HUMAN) (ISOPHANE) 100 UNIT/ML ~~LOC~~ SUSP: 35.0000 [IU] | Freq: Two times a day (BID) | SUBCUTANEOUS | Status: DC

## 2012-06-19 NOTE — Telephone Encounter (Signed)
The patient called to report that the cost for her Humulin N 100u flex pen is $66 for a box of 5.  The patient can get this for less money by using her [facility] mail order pharmacy.  The telephone number for the pharmacy is [phone].  The patient may be reached at [phone] if needed.

## 2012-06-20 ENCOUNTER — Telehealth: Payer: Self-pay | Admitting: Internal Medicine

## 2012-06-20 ENCOUNTER — Other Ambulatory Visit: Payer: Self-pay

## 2012-06-20 ENCOUNTER — Other Ambulatory Visit: Payer: Self-pay | Admitting: *Deleted

## 2012-06-20 DIAGNOSIS — Z1231 Encounter for screening mammogram for malignant neoplasm of breast: Secondary | ICD-10-CM

## 2012-06-20 MED ORDER — INSULIN NPH (HUMAN) (ISOPHANE) 100 UNIT/ML ~~LOC~~ SUSP: 35.0000 [IU] | Freq: Two times a day (BID) | SUBCUTANEOUS | Status: DC

## 2012-06-20 NOTE — Telephone Encounter (Signed)
The patient called and is hoping to have her physical therapy transferred to [facility] clinic [phone]).  The pt's callback number - [phone]

## 2012-07-10 ENCOUNTER — Ambulatory Visit (INDEPENDENT_AMBULATORY_CARE_PROVIDER_SITE_OTHER): Payer: Medicare Other | Admitting: Internal Medicine

## 2012-07-10 ENCOUNTER — Encounter: Payer: Self-pay | Admitting: Internal Medicine

## 2012-07-10 VITALS — BP 130/74 | HR 93 | Temp 98.1°F | Wt 258.8 lb

## 2012-07-10 DIAGNOSIS — G43909 Migraine, unspecified, not intractable, without status migrainosus: Secondary | ICD-10-CM

## 2012-07-10 MED ORDER — TOPIRAMATE 50 MG PO TABS: 50.0000 mg | ORAL_TABLET | Freq: Two times a day (BID) | ORAL | Status: DC

## 2012-07-10 MED ORDER — HYDROCODONE-ACETAMINOPHEN 5-325 MG PO TABS: 1.0000 | ORAL_TABLET | Freq: Four times a day (QID) | ORAL | Status: DC | PRN

## 2012-07-10 NOTE — Assessment & Plan Note (Signed)
Increase in headache symptoms since March 2014 medication changes when psychiatry stopped Remeron and Topamax Neuro exam remains benign but headache symptoms are persistent and severe Check MRI head to exclude underlying anatomic abnormality Resume Topamax for treatment of migraines, unmasked by sudden discontinuation of same with psychiatry's effort to simplify medication regimen Okay for Norco to use as needed with severe symptoms Followup if symptoms worse or unimproved on resumed Topamax therapy

## 2012-07-10 NOTE — Progress Notes (Signed)
Subjective:    Patient ID: Brittany Acosta, female    DOB: Apr 30, 1950, 62 y.o.   MRN: [id number]  Headache  This is a chronic problem. The current episode started more than 1 month ago. The problem occurs constantly. The problem has been gradually worsening. The pain is located in the right unilateral and parietal region. The pain does not radiate. The pain quality is not similar to prior headaches. The quality of the pain is described as aching. The pain is moderate. Associated symptoms include ear pain. Pertinent negatives include no anorexia, blurred vision, coughing, dizziness, fever, hearing loss, loss of balance, nausea, neck pain, phonophobia, photophobia, scalp tenderness, seizures, sore throat, swollen glands, tingling, tinnitus, visual change or vomiting. The symptoms are aggravated by bright light and fatigue. She has tried acetaminophen and oral narcotics for the symptoms. The treatment provided mild relief. Her past medical history is significant for hypertension, migraine headaches, obesity and sinus disease. There is no history of immunosuppression or recent head traumas.     Past Medical History  Diagnosis Date  . GOITER, MULTINODULAR   . HYPOTHYROIDISM   . HYPERCHOLESTEROLEMIA   . Morbid obesity   . BIPOLAR DISORDER UNSPECIFIED     depression  . Chronic diastolic heart failure   . ALLERGIC RHINITIS   . APHTHOUS STOMATITIS   . TRANSAMINASES, SERUM, ELEVATED   . ASYMPTOMATIC POSTMENOPAUSAL STATUS   . FIBROMYALGIA   . GERD (gastroesophageal reflux disease)   . History of wrist fracture     rt wrist  . Depression   . PONV (postoperative nausea and vomiting)   . Type II diabetes mellitus   . Neuropathy due to secondary diabetes   . Iron deficiency anemia   . Kidney stones     sees urologist @ [facility]  . External bleeding hemorrhoids   . Rheumatoid arthritis     knees and shoulders  . Fibromyalgia   . History of gout     "haven't had it in several years" (02/08/2012)  .  PTSD (post-traumatic stress disorder)     Review of Systems  Constitutional: Negative for fever and fatigue.  HENT: Positive for ear pain. Negative for hearing loss, sore throat, neck pain and tinnitus.   Eyes: Negative for blurred vision and photophobia.  Respiratory: Negative for cough and shortness of breath.   Cardiovascular: Negative for chest pain and palpitations.  Gastrointestinal: Negative for nausea, vomiting and anorexia.  Neurological: Positive for headaches. Negative for dizziness, tingling, seizures and loss of balance.       Objective:   Physical Exam  BP 130/74  Pulse 93  Temp(Src) 98.1 F (36.7 C) (Oral)  Wt 258 lb 12.8 oz (117.391 kg)  BMI 45.12 kg/m2  SpO2 96% Wt Readings from Last 3 Encounters:  07/10/12 258 lb 12.8 oz (117.391 kg)  06/07/12 253 lb (114.76 kg)  06/07/12 254 lb (115.214 kg)   Constitutional: She appears well-developed and well-nourished. No distress.  HENT: normocephalic, atraumatic. Sinuses nontender to palpation. No temporal tenderness or scalp tenderness. Nose normal. Throat with clear oropharynx, no erythema or exudate. Ears with TMs clear bilaterally without erythema or effusion Eyes: PERRL, EOMI. Vision grossly intact bilaterally. Neck: Normal range of motion. Neck supple. No JVD present. No thyromegaly present.  Cardiovascular: Normal rate, regular rhythm and normal heart sounds.  No murmur heard. No BLE edema. Pulmonary/Chest: Effort normal and breath sounds normal. No respiratory distress. She has no wheezes.  Neurological: She is alert and oriented to person, place, and  time. No cranial nerve deficit. Coordination, balance, gait, speech and recall are normal.  Skin: Skin is warm and dry. No rash noted. No erythema.  Psychiatric: She has a normal mood and affect. Her behavior is normal. Judgment and thought content normal.   Lab Results  Component Value Date   WBC 9.7 06/07/2012   HGB 12.3 06/07/2012   HCT 37.7 06/07/2012   PLT 304.0  06/07/2012   GLUCOSE 144* 02/08/2012   CHOL 128 06/11/2011   TRIG 228.0* 06/11/2011   HDL 50.00 06/11/2011   LDLDIRECT 56.4 06/11/2011   LDLCALC 42 09/03/2008   ALT 22 01/21/2012   AST 23 01/21/2012   NA 137 02/08/2012   K 3.6 02/08/2012   CL 104 02/08/2012   CREATININE 0.76 02/08/2012   BUN 12 02/08/2012   CO2 26 02/08/2012   TSH 7.09* 12/09/2011   INR 1.01 02/04/2012   HGBA1C 8.9* 06/07/2012   MICROALBUR 0.2 12/24/2008       Assessment & Plan:  See problem list. Medications and labs reviewed today.

## 2012-07-10 NOTE — Patient Instructions (Signed)
It was good to see you today. We have reviewed your prior records including labs and tests today we'll make referral for MRI brain because of migraine headache symptoms. Our office will contact you regarding appointment(s) once made. Your results will be released to MyChart (or called to you) after review, usually within 72hours after test completion. If any changes need to be made, you will be notified at that same time. Until then, resumed Topamax 50 mg twice daily for migraine treatment. Okay to use Norco as needed for severe pain - Your prescription(s) have been submitted to your pharmacy. Please take as directed and contact our office if you believe you are having problem(s) with the medication(s). Continue working with physical therapy for balance and gait training . Our office will contact you regarding appointment(s) once made. Continue to work with other specialists as ongoing Please keep scheduled followup as planned, call sooner if problems. Migraine Headache A migraine headache is an intense, throbbing pain on one or both sides of your head. A migraine can last for 30 minutes to several hours. CAUSES  The exact cause of a migraine headache is not always known. However, a migraine may be caused when nerves in the brain become irritated and release chemicals that cause inflammation. This causes pain. SYMPTOMS  Pain on one or both sides of your head.  Pulsating or throbbing pain.  Severe pain that prevents daily activities.  Pain that is aggravated by any physical activity.  Nausea, vomiting, or both.  Dizziness.  Pain with exposure to bright lights, loud noises, or activity.  General sensitivity to bright lights, loud noises, or smells. Before you get a migraine, you may get warning signs that a migraine is coming (aura). An aura may include:  Seeing flashing lights.  Seeing bright spots, halos, or zig-zag lines.  Having tunnel vision or blurred vision.  Having feelings  of numbness or tingling.  Having trouble talking.  Having muscle weakness. MIGRAINE TRIGGERS  Alcohol.  Smoking.  Stress.  Menstruation.  Aged cheeses.  Foods or drinks that contain nitrates, glutamate, aspartame, or tyramine.  Lack of sleep.  Chocolate.  Caffeine.  Hunger.  Physical exertion.  Fatigue.  Medicines used to treat chest pain (nitroglycerine), birth control pills, estrogen, and some blood pressure medicines. DIAGNOSIS  A migraine headache is often diagnosed based on:  Symptoms.  Physical examination.  A CT scan or MRI of your head. TREATMENT Medicines may be given for pain and nausea. Medicines can also be given to help prevent recurrent migraines.  HOME CARE INSTRUCTIONS  Only take over-the-counter or prescription medicines for pain or discomfort as directed by your caregiver. The use of long-term narcotics is not recommended.  Lie down in a dark, quiet room when you have a migraine.  Keep a journal to find out what may trigger your migraine headaches. For example, write down:  What you eat and drink.  How much sleep you get.  Any change to your diet or medicines.  Limit alcohol consumption.  Quit smoking if you smoke.  Get 7 to 9 hours of sleep, or as recommended by your caregiver.  Limit stress.  Keep lights dim if bright lights bother you and make your migraines worse. SEEK IMMEDIATE MEDICAL CARE IF:   Your migraine becomes severe.  You have a fever.  You have a stiff neck.  You have vision loss.  You have muscular weakness or loss of muscle control.  You start losing your balance or have trouble walking.  You feel faint or pass out.  You have severe symptoms that are different from your first symptoms. MAKE SURE YOU:   Understand these instructions.  Will watch your condition.  Will get help right away if you are not doing well or get worse. Document Released: 03/22/2005 Document Revised: 06/14/2011 Document  Reviewed: 03/12/2011 [facility] Patient Information 2013 [place], [place].

## 2012-07-19 ENCOUNTER — Ambulatory Visit
Admission: RE | Admit: 2012-07-19 | Discharge: 2012-07-19 | Disposition: A | Discharge status: Home / Self Care | Payer: Medicare Other | Source: Ambulatory Visit | Attending: Internal Medicine | Admitting: Internal Medicine

## 2012-07-19 DIAGNOSIS — G43909 Migraine, unspecified, not intractable, without status migrainosus: Secondary | ICD-10-CM

## 2012-07-27 ENCOUNTER — Other Ambulatory Visit: Payer: Self-pay | Admitting: Internal Medicine

## 2012-07-28 ENCOUNTER — Other Ambulatory Visit: Payer: Self-pay | Admitting: Internal Medicine

## 2012-07-28 ENCOUNTER — Ambulatory Visit
Admission: RE | Admit: 2012-07-28 | Discharge: 2012-07-28 | Disposition: A | Discharge status: Home / Self Care | Payer: Medicare Other | Source: Ambulatory Visit

## 2012-07-28 ENCOUNTER — Telehealth: Payer: Self-pay | Admitting: Internal Medicine

## 2012-07-28 DIAGNOSIS — G43909 Migraine, unspecified, not intractable, without status migrainosus: Secondary | ICD-10-CM

## 2012-07-28 DIAGNOSIS — R928 Other abnormal and inconclusive findings on diagnostic imaging of breast: Secondary | ICD-10-CM | POA: Insufficient documentation

## 2012-07-28 DIAGNOSIS — Z1231 Encounter for screening mammogram for malignant neoplasm of breast: Secondary | ICD-10-CM

## 2012-07-28 NOTE — Telephone Encounter (Signed)
Called pt she states she is still having the migraine along with the med md rx. She is also concern about the weight gain. She stats since Feb she has gain 18lbs. Saw her neurologist yesterday she was 15

## 2012-07-28 NOTE — Telephone Encounter (Signed)
Patient would like to speak with Valentina Gu because she is still having migraines and she is gaining weight

## 2012-07-28 NOTE — Telephone Encounter (Signed)
Faxed script [facility] to [facility].../lmb 

## 2012-07-31 ENCOUNTER — Encounter: Payer: Self-pay | Admitting: Internal Medicine

## 2012-07-31 ENCOUNTER — Other Ambulatory Visit: Payer: Self-pay | Admitting: Internal Medicine

## 2012-07-31 DIAGNOSIS — R928 Other abnormal and inconclusive findings on diagnostic imaging of breast: Secondary | ICD-10-CM

## 2012-07-31 NOTE — Telephone Encounter (Signed)
Breast ultrasound was scheduled to evaluate abnormal mammogram. I am unaware of the breast MRI, but this may have been ordered by radiology to evaluate her abnormality. If she has questions, she should discuss with the breast center where her mammogram was performed.

## 2012-07-31 NOTE — Telephone Encounter (Signed)
Notified pt with md response. Pt has ? about why she was schedule for a breast MRI. She stated md referred her. Pls advise....lmb

## 2012-07-31 NOTE — Telephone Encounter (Signed)
Notified pt with md response. Pt states ok also want to let md know she never seen neurologist will need referral...lmb

## 2012-07-31 NOTE — Telephone Encounter (Signed)
Defer tx of migraines to her neurologist - OV if needs to discuss with me Weight gain likely related to medications - she should talk with her psyciatrist about this side effect

## 2012-07-31 NOTE — Telephone Encounter (Signed)
Order to neuro done- thanks

## 2012-08-10 ENCOUNTER — Ambulatory Visit
Admission: RE | Admit: 2012-08-10 | Discharge: 2012-08-10 | Disposition: A | Discharge status: Home / Self Care | Payer: Medicare Other | Source: Ambulatory Visit | Attending: Internal Medicine | Admitting: Internal Medicine

## 2012-08-10 ENCOUNTER — Other Ambulatory Visit: Payer: Self-pay | Admitting: Internal Medicine

## 2012-08-10 DIAGNOSIS — N6320 Unspecified lump in the left breast, unspecified quadrant: Secondary | ICD-10-CM

## 2012-08-10 DIAGNOSIS — R928 Other abnormal and inconclusive findings on diagnostic imaging of breast: Secondary | ICD-10-CM

## 2012-08-11 ENCOUNTER — Ambulatory Visit
Admission: RE | Admit: 2012-08-11 | Discharge: 2012-08-11 | Disposition: A | Discharge status: Home / Self Care | Payer: Medicare Other | Source: Ambulatory Visit | Attending: Internal Medicine | Admitting: Internal Medicine

## 2012-08-11 DIAGNOSIS — N6320 Unspecified lump in the left breast, unspecified quadrant: Secondary | ICD-10-CM

## 2012-08-21 ENCOUNTER — Encounter: Payer: Self-pay | Admitting: Neurology

## 2012-08-21 ENCOUNTER — Ambulatory Visit (INDEPENDENT_AMBULATORY_CARE_PROVIDER_SITE_OTHER): Payer: Medicare Other | Admitting: Neurology

## 2012-08-21 VITALS — BP 150/70 | HR 88 | Temp 97.7°F | Resp 12 | Ht 63.5 in | Wt 257.0 lb

## 2012-08-21 DIAGNOSIS — G43819 Other migraine, intractable, without status migrainosus: Secondary | ICD-10-CM

## 2012-08-21 DIAGNOSIS — G43119 Migraine with aura, intractable, without status migrainosus: Secondary | ICD-10-CM

## 2012-08-21 MED ORDER — ALMOTRIPTAN MALATE 12.5 MG PO TABS: 12.5000 mg | ORAL_TABLET | ORAL | Status: DC | PRN

## 2012-08-21 NOTE — Patient Instructions (Addendum)
Follow up in one month with Dr. Smiley Houseman.
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
## 2012-08-22 ENCOUNTER — Encounter (HOSPITAL_COMMUNITY): Payer: Self-pay | Admitting: Pharmacy Technician

## 2012-08-22 ENCOUNTER — Encounter (INDEPENDENT_AMBULATORY_CARE_PROVIDER_SITE_OTHER): Payer: Self-pay | Admitting: General Surgery

## 2012-08-22 ENCOUNTER — Ambulatory Visit (INDEPENDENT_AMBULATORY_CARE_PROVIDER_SITE_OTHER): Payer: Medicare Other | Admitting: General Surgery

## 2012-08-22 VITALS — BP 150/86 | HR 73 | Temp 96.6°F | Ht 63.0 in | Wt 256.8 lb

## 2012-08-22 DIAGNOSIS — R928 Other abnormal and inconclusive findings on diagnostic imaging of breast: Secondary | ICD-10-CM

## 2012-08-22 NOTE — Progress Notes (Signed)
Patient ID: Brittany Acosta, female   DOB: 1950-05-06, 62 y.o.   MRN: [id number]  Chief Complaint  Patient presents with  . New Evaluation    eval br mass    HPI Brittany Acosta is a 62 y.o. female.  She is referred by Dr. Juel Burrow at the breast center of [facility] for evaluation of an abnormal mammogram of the left breast. Rene Paci  is her PCP.  The patient has a minimal history of breast problems. She did have a left breast biopsy in the lower inner quadrant many years ago for benign process. Recent screening mammograms led to focused left mammogram and ultrasound. This shows a persistent, solid, lobulated 1.1 cm lesion in the left breast at the 10:00 position 8 cm from the nipple. Image guided biopsy shows a biphasic lesion, with differential diagnosis of intraductal papilloma, hamartoma, and fibroadenoma. Pathology stated that surgical excision may help better evaluate this lesion.  Comorbidities include morbid obesity, bipolar disorder, insulin-dependent diabetes mellitus, as it had multiple surgeries including open cholecystectomy, bilateral total knee replacement, thyroidectomy, hysterectomy and BSO for endometriosis, right shoulder surgery.  Family history is negative for breast cancer. There is a fairly minimum and had uterine cancer but not ovarian cancer. Lots of cardiovascular disease in the family.  HPI  Past Medical History  Diagnosis Date  . GOITER, MULTINODULAR   . HYPOTHYROIDISM   . HYPERCHOLESTEROLEMIA   . Morbid obesity   . BIPOLAR DISORDER UNSPECIFIED     depression  . Chronic diastolic heart failure   . ALLERGIC RHINITIS   . APHTHOUS STOMATITIS   . TRANSAMINASES, SERUM, ELEVATED   . ASYMPTOMATIC POSTMENOPAUSAL STATUS   . FIBROMYALGIA   . GERD (gastroesophageal reflux disease)   . History of wrist fracture     rt wrist  . Depression   . PONV (postoperative nausea and vomiting)   . Type II diabetes mellitus   . Neuropathy due to secondary diabetes   . Iron  deficiency anemia   . Kidney stones     sees urologist @ [facility]  . External bleeding hemorrhoids   . Rheumatoid arthritis     knees and shoulders  . Fibromyalgia   . History of gout     "haven't had it in several years" (02/08/2012)  . PTSD (post-traumatic stress disorder)     Past Surgical History  Procedure Laterality Date  . Cholecystectomy  1985  . Abdominal hysterectomy  1976  . Tubal ligation  1972  . Right nasal surgery  08/1988  . Right sinus removed  08/1989    tooth partial  . Percitania stone removed (l) kidney  1992  . Lithotripsy (l) kidney  1997  . Left cystoscopy   1990  . Thyroidectomy  04/22/2011    Procedure: THYROIDECTOMY;  Surgeon: Antony Contras, MD;  Location: [facility] OR;  Service: ENT;  Laterality: N/A;  TOTAL THYROIDECOTMY  . Cataract extraction, bilateral  02/2011    epps  . Total knee arthroplasty  06/18/2011    Procedure: TOTAL KNEE ARTHROPLASTY;  Surgeon: Nestor Lewandowsky, MD;  Location: [facility] OR;  Service: Orthopedics;  Laterality: Left;  DEPUY  . Total knee arthroplasty  02/07/2012    Procedure: TOTAL KNEE ARTHROPLASTY;  Surgeon: Nestor Lewandowsky, MD;  Location: [facility] OR;  Service: Orthopedics;  Laterality: Right;  . Incisional breast biopsy  2000    right  . Cardiac catheterization  2001    sees Dr Peter Swaziland  . Knee arthroscopy  08/2009  right  . Knee arthroscopy with medial menisectomy      left  . Excisional hemorrhoidectomy      "dr cut out in his office" (02/08/2012)  . Left oophorectomy  1980  . Rotator cuff repair  2013    right shoulder x 2    Family History  Problem Relation Age of Onset  . Hypertension Mother   . Diabetes Father   . Hypertension Father   . Hyperlipidemia Father   . Heart attack Other   . Coronary artery disease Other     Social History History  Substance Use Topics  . Smoking status: Former Smoker -- 1.00 packs/day for 6 years    Types: Cigarettes    Quit date: 04/06/1983  . Smokeless tobacco: Never Used     Comment:  Married, lives with spouse. Disable- 2 grown kids-6 g-kids  . Alcohol Use: No    No Known Allergies  Current Outpatient Prescriptions  Medication Sig Dispense Refill  . almotriptan (AXERT) 12.5 MG tablet Take 1 tablet (12.5 mg total) by mouth as needed for migraine. may repeat in 2 hours if needed  10 tablet  2  . Alpha-Lipoic Acid 300 MG TABS Take 300 mg by mouth daily.       . ARIPiprazole (ABILIFY) 20 MG tablet Take 20 mg by mouth at bedtime.       Marland Kitchen aspirin 81 MG tablet Take 81 mg by mouth daily.      Marland Kitchen atorvastatin (LIPITOR) 20 MG tablet Take 1 tablet (20 mg total) by mouth daily.  90 tablet  3  . Cholecalciferol (VITAMIN D3) 400 UNITS CAPS Take 400 Units by mouth 2 (two) times daily.       . clonazePAM (KLONOPIN) 1 MG tablet Take 2 mg by mouth at bedtime.       . Coenzyme Q10 120 MG CAPS Take 120 mg by mouth daily.      Marland Kitchen estrogens, conjugated, (PREMARIN) 0.3 MG tablet Take 0.3 mg by mouth daily.      . Ferrous Sulfate (IRON) 325 (65 FE) MG TABS Take 325 mg by mouth every evening.       . furosemide (LASIX) 40 MG tablet TAKE 1 TABLET DAILY  90 tablet  2  . glucose blood (ONE TOUCH ULTRA TEST) test strip test blood sugar 2 times daily dx 250.01, and lancets and alcohol preps, 2/day  200 each  3  . hydrochlorothiazide 25 MG tablet Take 25 mg by mouth every morning.       Marland Kitchen HYDROcodone-acetaminophen (NORCO/VICODIN) 5-325 MG per tablet TAKE 1 TABLET BY MOUTH EVERY 6 HOURS AS NEEDED FOR PAIN  20 tablet  0  . indomethacin (INDOCIN SR) 75 MG CR capsule Take 75 mg by mouth 2 (two) times daily between meals as needed.       . insulin NPH (HUMULIN N,NOVOLIN N) 100 UNIT/ML injection Inject 35 Units into the skin 2 (two) times daily.  7 vial  3  . Insulin Pen Needle 31G X 5 MM MISC 1 each by Other route 2 (two) times daily.  100 each  6  . Lactobacillus (ULTIMATE PROBIOTIC FORMULA PO) Take 1 capsule by mouth every evening.       . lamoTRIgine (LAMICTAL) 200 MG tablet Take 200 mg by mouth daily at  12 noon.       Marland Kitchen levothyroxine (SYNTHROID, LEVOTHROID) 50 MCG tablet Take 1 tablet (50 mcg total) by mouth daily.  90 tablet  1  .  Magnesium 200 MG TABS Take 200 mg by mouth daily.      . Manganese Gluconate 50 MG TABS Take 50 mg by mouth every evening.       . meloxicam (MOBIC) 15 MG tablet Take 15 mg by mouth daily. Take with food.      . Multiple Vitamin (MULTIVITAMIN WITH MINERALS) TABS Take 1 tablet by mouth every evening.      . NON FORMULARY Hair Revitalizing formula take 2 tabs daily      . omeprazole (PRILOSEC) 20 MG capsule TAKE 1 CAPSULE TWICE DAILY  180 capsule  1  . oxybutynin (DITROPAN-XL) 10 MG 24 hr tablet Take 10 mg by mouth every evening.       . potassium citrate (UROCIT-K 10) 10 MEQ (1080 MG) SR tablet Take 20 mEq by mouth 2 (two) times daily.       . QUEtiapine Fumarate (SEROQUEL XR) 150 MG TB24 Take 150-450 mg by mouth 2 (two) times daily. Take 1 every morning and 3  At bedtime      . Thiamine HCl (VITAMIN B-1) 100 MG tablet Take 100 mg by mouth every evening.       . topiramate (TOPAMAX) 50 MG tablet Take 1 tablet (50 mg total) by mouth 2 (two) times daily.  60 tablet  3  . venlafaxine (EFFEXOR-XR) 150 MG 24 hr capsule Take 150 mg by mouth every morning.         No current facility-administered medications for this visit.    Review of Systems Review of Systems  Constitutional: Negative for fever, chills and unexpected weight change.  HENT: Negative for hearing loss, congestion, sore throat, trouble swallowing and voice change.   Eyes: Negative for visual disturbance.  Respiratory: Negative for cough and wheezing.   Cardiovascular: Negative for chest pain, palpitations and leg swelling.  Gastrointestinal: Negative for nausea, vomiting, abdominal pain, diarrhea, constipation, blood in stool, abdominal distention and anal bleeding.  Genitourinary: Negative for hematuria, vaginal bleeding and difficulty urinating.  Musculoskeletal: Negative for arthralgias.  Skin:  Negative for rash and wound.  Neurological: Negative for seizures, syncope and headaches.  Hematological: Negative for adenopathy. Does not bruise/bleed easily.  Psychiatric/Behavioral: Negative for confusion. The patient is nervous/anxious.     Blood pressure 150/86, pulse 73, temperature 96.6 F (35.9 C), temperature source Temporal, height 5\' 3"  (1.6 m), weight 256 lb 12.8 oz (116.484 kg), SpO2 98.00%.  Physical Exam Physical Exam  Constitutional: She is oriented to person, place, and time. She appears well-developed and well-nourished. No distress.  HENT:  Head: Normocephalic and atraumatic.  Nose: Nose normal.  Mouth/Throat: No oropharyngeal exudate.  Eyes: Conjunctivae and EOM are normal. Pupils are equal, round, and reactive to light. Left eye exhibits no discharge. No scleral icterus.  Neck: Neck supple. No JVD present. No tracheal deviation present. No thyromegaly present.  Thyroidectomy scar well healed. No adenopathy  Cardiovascular: Normal rate, regular rhythm, normal heart sounds and intact distal pulses.   No murmur heard. Pulmonary/Chest: Effort normal and breath sounds normal. No respiratory distress. She has no wheezes. She has no rales. She exhibits no tenderness.    Biopsy site in bruise left breast, 11:00 position. Tiny hematoma. No other mass either breast. No axillary adenopathy on either side. Well-healed biopsy scar left breast, lower inner quadrant.  Abdominal: Soft. Bowel sounds are normal. She exhibits no distension and no mass. There is no tenderness. There is no rebound and no guarding.  Right subcostal incision well-healed.  Musculoskeletal:  She exhibits no edema and no tenderness.  Lymphadenopathy:    She has no cervical adenopathy.  Neurological: She is alert and oriented to person, place, and time. She exhibits normal muscle tone. Coordination normal.  Skin: Skin is warm. No rash noted. She is not diaphoretic. No erythema. No pallor.  Psychiatric: She  has a normal mood and affect. Her behavior is normal. Judgment and thought content normal.    Data Reviewed Mammograms. Pathology report. Position meds.  Assessment    Abnormal mammogram left breast, upper inner quadrant with biopsy-proven biphasic lesion. Differential diagnosis includes benign papilloma, hamartoma, fibroadenoma. Noninvasive breast cancer risk here is 5-10% at most. Excision is reasonable.  Morbid obesity  Bipolar disorder  Isn't to diabetes mellitus  Status post multiple operations.  Negative family history for breast cancer    Plan    The patient strongly desires at this area be excised to exclude cancer. That is reasonable.  She'll be scheduled for left partial mastectomy with needle localization.  I've discussed indications, details, techniques, and numerous risk of surgery with her. I've explained to her the risk of bleeding, infection, reoperation, cosmetic deformity, skin necrosis, and other unforeseen problems. She understands all these issues well. All of her questions are answered. She agrees with this plan.        Angelia Mould. Derrell Lolling, M.D., [facility] General and Minimally invasive Surgery Breast and Colorectal Surgery Office:   [phone] Pager:   [phone]  08/22/2012, 11:11 AM

## 2012-08-22 NOTE — Patient Instructions (Signed)
There is a solid tumor in your left breast. Most likely this is noncancerous. There is a small chance that an early breast cancer could be in this area.  You will be scheduled for left partial mastectomy with needle localization in the near future.      Lumpectomy, Breast Conserving Surgery A lumpectomy is breast surgery that removes only part of the breast. Another name used may be partial mastectomy. The amount removed varies. Make sure you understand how much of your breast will be removed. Reasons for a lumpectomy:  Any solid breast mass.  Grouped significant nodularity that may be confused with a solitary breast mass. Lumpectomy is the most common form of breast cancer surgery today. The surgeon removes the portion of your breast which contains the tumor (cancer). This is the lump. Some normal tissue around the lump is also removed to be sure that all the tumor has been removed.  If cancer cells are found in the margins where the breast tissue was removed, your surgeon will do more surgery to remove the remaining cancer tissue. This is called re-excision surgery. Radiation and/or chemotherapy treatments are often given following a lumpectomy to kill any cancer cells that could possibly remain.  REASONS YOU MAY NOT BE ABLE TO HAVE BREAST CONSERVING SURGERY:  The tumor is located in more than one place.  Your breast is small and the tumor is large so the breast would be disfigured.  The entire tumor removal is not successful with a lumpectomy.  You cannot commit to a full course of chemotherapy, radiation therapy or are pregnant and cannot have radiation.  You have previously had radiation to the breast to treat cancer. HOW A LUMPECTOMY IS PERFORMED If overnight nursing is not required following a biopsy, a lumpectomy can be performed as a same-day surgery. This can be done in a hospital, clinic, or surgical center. The anesthesia used will depend on your surgeon. They will discuss  this with you. A general anesthetic keeps you sleeping through the procedure. LET YOUR CAREGIVERS KNOW ABOUT THE FOLLOWING:  Allergies  Medications taken including herbs, eye drops, over the counter medications, and creams.  Use of steroids (by mouth or creams)  Previous problems with anesthetics or Novocaine.  Possibility of pregnancy, if this applies  History of blood clots (thrombophlebitis)  History of bleeding or blood problems.  Previous surgery  Other health problems BEFORE THE PROCEDURE You should be present one hour prior to your procedure unless directed otherwise.  AFTER THE PROCEDURE  After surgery, you will be taken to the recovery area where a nurse will watch and check your progress. Once you're awake, stable, and taking fluids well, barring other problems you will be allowed to go home.  Ice packs applied to your operative site may help with discomfort and keep the swelling down.  A small rubber drain may be placed in the breast for a couple of days to prevent a hematoma from developing in the breast.  A pressure dressing may be applied for 24 to 48 hours to prevent bleeding.  Keep the wound dry.  You may resume a normal diet and activities as directed. Avoid strenuous activities affecting the arm on the side of the biopsy site such as tennis, swimming, heavy lifting (more than 10 pounds) or pulling.  Bruising in the breast is normal following this procedure.  Wearing a bra - even to bed - may be more comfortable and also help keep the dressing on.  Change dressings  as directed.  Only take over-the-counter or prescription medicines for pain, discomfort, or fever as directed by your caregiver. Call for your results as instructed by your surgeon. Remember it is your responsibility to get the results of your lumpectomy if your surgeon asked you to follow-up. Do not assume everything is fine if you have not heard from your caregiver. SEEK MEDICAL CARE IF:    There is increased bleeding (more than a small spot) from the wound.  You notice redness, swelling, or increasing pain in the wound.  Pus is coming from wound.  An unexplained oral temperature above 102 F (38.9 C) develops.  You notice a foul smell coming from the wound or dressing. SEEK IMMEDIATE MEDICAL CARE IF:   You develop a rash.  You have difficulty breathing.  You have any allergic problems. Document Released: 05/03/2006 Document Revised: 06/14/2011 Document Reviewed: 08/04/2006 [facility] Patient Information 2013 [place], [place].

## 2012-08-23 ENCOUNTER — Ambulatory Visit: Payer: Medicare Other | Admitting: Internal Medicine

## 2012-08-31 ENCOUNTER — Encounter (HOSPITAL_COMMUNITY): Payer: Self-pay

## 2012-08-31 ENCOUNTER — Encounter (HOSPITAL_COMMUNITY)
Admission: RE | Admit: 2012-08-31 | Discharge: 2012-08-31 | Disposition: A | Discharge status: Home / Self Care | Payer: Medicare Other | Source: Ambulatory Visit | Attending: General Surgery | Admitting: General Surgery

## 2012-08-31 LAB — BASIC METABOLIC PANEL
BUN: 13 mg/dL (ref 6–23)
CO2: 30 mEq/L (ref 19–32)
Calcium: 9.7 mg/dL (ref 8.4–10.5)
Chloride: 92 mEq/L — ABNORMAL LOW (ref 96–112)
Creatinine, Ser: 0.72 mg/dL (ref 0.50–1.10)
GFR calc Af Amer: >90 mL/min (ref >90)
GFR calc non Af Amer: >90 mL/min (ref >90)
Glucose, Bld: 194 mg/dL — ABNORMAL HIGH (ref 70–99)
Potassium: 3.2 mEq/L — ABNORMAL LOW (ref 3.5–5.1)
Sodium: 135 mEq/L (ref 135–145)

## 2012-08-31 LAB — SURGICAL PCR SCREEN
MRSA, PCR: NEGATIVE
Staphylococcus aureus: NEGATIVE

## 2012-08-31 LAB — CBC
HCT: 38 % (ref 36.0–46.0)
Hemoglobin: 12.8 g/dL (ref 12.0–15.0)
MCH: 27.4 pg (ref 26.0–34.0)
MCHC: 33.7 g/dL (ref 30.0–36.0)
MCV: 81.4 fL (ref 78.0–100.0)
Platelets: 337 10*3/uL (ref 150–400)
RBC: 4.67 MIL/uL (ref 3.87–5.11)
RDW: 14.7 % (ref 11.5–15.5)
WBC: 10.6 10*3/uL — ABNORMAL HIGH (ref 4.0–10.5)

## 2012-08-31 MED ORDER — DEXTROSE 5 % IV SOLN
3.0000 g | INTRAVENOUS | Status: AC
  Administered 2012-09-01: 3 g via INTRAVENOUS
  Filled 2012-08-31: qty 3000

## 2012-08-31 NOTE — Progress Notes (Signed)
Will give to anesthesia for cardiac review

## 2012-08-31 NOTE — Pre-Procedure Instructions (Signed)
Brittany Acosta  08/31/2012   Your procedure is scheduled on:  09/01/12  Report to [facility] at 8 AM.  Call this number if you have problems the morning of surgery: [phone]   Remember:   Do not eat food or drink liquids after midnight.   Take these medicines the morning of surgery with A SIP OF WATER: synthroid,prilosec,effexor,premarin   Do not wear jewelry, make-up or nail polish.  Do not wear lotions, powders, or perfumes. You may wear deodorant.  Do not shave 48 hours prior to surgery. Men may shave face and neck.  Do not bring valuables to the hospital.  Contacts, dentures or bridgework may not be worn into surgery.  Leave suitcase in the car. After surgery it may be brought to your room.  For patients admitted to the hospital, checkout time is 11:00 AM the day of  discharge.   Patients discharged the day of surgery will not be allowed to drive  home.  Name and phone number of your driver: family  Special Instructions: Incentive Spirometry - Practice and bring it with you on the day of surgery.   Please read over the following fact sheets that you were given: Pain Booklet, Coughing and Deep Breathing, MRSA Information and Surgical Site Infection Prevention

## 2012-08-31 NOTE — H&P (Signed)
Brittany Acosta    MRN:  [id number]   Description: 62 year old female  Provider: Ernestene Mention, MD  Department: [facility]        Diagnoses    Abnormal mammogram    -  Primary    793.80      Reason for Visit    New Evaluation    eval br mass        Current Vitals - Last Recorded    BP Pulse Temp(Src) Ht Wt BMI    150/86 73 96.6 F (35.9 C) (Temporal) 5\' 3"  (1.6 m) 256 lb 12.8 oz (116.484 kg) 45.5 kg/m2               History and Physical    Ernestene Mention, MD    Status: Signed                           HPI Brittany Acosta is a 62 y.o. female.  She is referred by Dr. Juel Burrow at the breast center of [facility] for evaluation of an abnormal mammogram of the left breast. Rene Paci  is her PCP.   The patient has a minimal history of breast problems. She did have a left breast biopsy in the lower inner quadrant many years ago for benign process. Recent screening mammograms led to focused left mammogram and ultrasound. This shows a persistent, solid, lobulated 1.1 cm lesion in the left breast at the 10:00 position 8 cm from the nipple. Image guided biopsy shows a biphasic lesion, with differential diagnosis of intraductal papilloma, hamartoma, and fibroadenoma. Pathology stated that surgical excision may help better evaluate this lesion.   Comorbidities include morbid obesity, bipolar disorder, insulin-dependent diabetes mellitus, as it had multiple surgeries including open cholecystectomy, bilateral total knee replacement, thyroidectomy, hysterectomy and BSO for endometriosis, right shoulder surgery.   Family history is negative for breast cancer. There is a fairly minimum and had uterine cancer but not ovarian cancer. Lots of cardiovascular disease in the family.        Past Medical History   Diagnosis  Date   .  GOITER, MULTINODULAR     .  HYPOTHYROIDISM     .  HYPERCHOLESTEROLEMIA     .  Morbid obesity     .  BIPOLAR DISORDER UNSPECIFIED          depression   .  Chronic diastolic heart failure     .  ALLERGIC RHINITIS     .  APHTHOUS STOMATITIS     .  TRANSAMINASES, SERUM, ELEVATED     .  ASYMPTOMATIC POSTMENOPAUSAL STATUS     .  FIBROMYALGIA     .  GERD (gastroesophageal reflux disease)     .  History of wrist fracture         rt wrist   .  Depression     .  PONV (postoperative nausea and vomiting)     .  Type II diabetes mellitus     .  Neuropathy due to secondary diabetes     .  Iron deficiency anemia     .  Kidney stones         sees urologist @ [facility]   .  External bleeding hemorrhoids     .  Rheumatoid arthritis         knees and shoulders   .  Fibromyalgia     .  History  of gout         "haven't had it in several years" (02/08/2012)   .  PTSD (post-traumatic stress disorder)           Past Surgical History   Procedure  Laterality  Date   .  Cholecystectomy    1985   .  Abdominal hysterectomy    1976   .  Tubal ligation    1972   .  Right nasal surgery    08/1988   .  Right sinus removed    08/1989       tooth partial   .  Percitania stone removed (l) kidney    1992   .  Lithotripsy (l) kidney    1997   .  Left cystoscopy     1990   .  Thyroidectomy    04/22/2011       Procedure: THYROIDECTOMY;  Surgeon: Antony Contras, MD;  Location: [facility] OR;  Service: ENT;  Laterality: N/A;  TOTAL THYROIDECOTMY   .  Cataract extraction, bilateral    02/2011       epps   .  Total knee arthroplasty    06/18/2011       Procedure: TOTAL KNEE ARTHROPLASTY;  Surgeon: Nestor Lewandowsky, MD;  Location: [facility] OR;  Service: Orthopedics;  Laterality: Left;  DEPUY   .  Total knee arthroplasty    02/07/2012       Procedure: TOTAL KNEE ARTHROPLASTY;  Surgeon: Nestor Lewandowsky, MD;  Location: [facility] OR;  Service: Orthopedics;  Laterality: Right;   .  Incisional breast biopsy    2000       right   .  Cardiac catheterization    2001       sees Dr Peter [place]   .  Knee arthroscopy    08/2009       right   .  Knee arthroscopy with medial menisectomy            left   .  Excisional hemorrhoidectomy           "dr cut out in his office" (02/08/2012)   .  Left oophorectomy    1980   .  Rotator cuff repair    2013       right shoulder x 2         Family History   Problem  Relation  Age of Onset   .  Hypertension  Mother     .  Diabetes  Father     .  Hypertension  Father     .  Hyperlipidemia  Father     .  Heart attack  Other     .  Coronary artery disease  Other          Social History History   Substance Use Topics   .  Smoking status:  Former Smoker -- 1.00 packs/day for 6 years       Types:  Cigarettes       Quit date:  04/06/1983   .  Smokeless tobacco:  Never Used         Comment: Married, lives with spouse. Disable- 2 grown kids-6 g-kids   .  Alcohol Use:  No        No Known Allergies    Current Outpatient Prescriptions   Medication  Sig  Dispense  Refill   .  almotriptan (AXERT) 12.5 MG tablet  Take 1 tablet (12.5 mg total)  by mouth as needed for migraine. may repeat in 2 hours if needed   10 tablet   2   .  Alpha-Lipoic Acid 300 MG TABS  Take 300 mg by mouth daily.          .  ARIPiprazole (ABILIFY) 20 MG tablet  Take 20 mg by mouth at bedtime.          Marland Kitchen  aspirin 81 MG tablet  Take 81 mg by mouth daily.         Marland Kitchen  atorvastatin (LIPITOR) 20 MG tablet  Take 1 tablet (20 mg total) by mouth daily.   90 tablet   3   .  Cholecalciferol (VITAMIN D3) 400 UNITS CAPS  Take 400 Units by mouth 2 (two) times daily.          .  clonazePAM (KLONOPIN) 1 MG tablet  Take 2 mg by mouth at bedtime.          .  Coenzyme Q10 120 MG CAPS  Take 120 mg by mouth daily.         Marland Kitchen  estrogens, conjugated, (PREMARIN) 0.3 MG tablet  Take 0.3 mg by mouth daily.         .  Ferrous Sulfate (IRON) 325 (65 FE) MG TABS  Take 325 mg by mouth every evening.          .  furosemide (LASIX) 40 MG tablet  TAKE 1 TABLET DAILY   90 tablet   2   .  glucose blood (ONE TOUCH ULTRA TEST) test strip  test blood sugar 2 times daily dx 250.01, and lancets and  alcohol preps, 2/day   200 each   3   .  hydrochlorothiazide 25 MG tablet  Take 25 mg by mouth every morning.          Marland Kitchen  HYDROcodone-acetaminophen (NORCO/VICODIN) 5-325 MG per tablet  TAKE 1 TABLET BY MOUTH EVERY 6 HOURS AS NEEDED FOR PAIN   20 tablet   0   .  indomethacin (INDOCIN SR) 75 MG CR capsule  Take 75 mg by mouth 2 (two) times daily between meals as needed.          .  insulin NPH (HUMULIN N,NOVOLIN N) 100 UNIT/ML injection  Inject 35 Units into the skin 2 (two) times daily.   7 vial   3   .  Insulin Pen Needle 31G X 5 MM MISC  1 each by Other route 2 (two) times daily.   100 each   6   .  Lactobacillus (ULTIMATE PROBIOTIC FORMULA PO)  Take 1 capsule by mouth every evening.          .  lamoTRIgine (LAMICTAL) 200 MG tablet  Take 200 mg by mouth daily at 12 noon.          Marland Kitchen  levothyroxine (SYNTHROID, LEVOTHROID) 50 MCG tablet  Take 1 tablet (50 mcg total) by mouth daily.   90 tablet   1   .  Magnesium 200 MG TABS  Take 200 mg by mouth daily.         .  Manganese Gluconate 50 MG TABS  Take 50 mg by mouth every evening.          .  meloxicam (MOBIC) 15 MG tablet  Take 15 mg by mouth daily. Take with food.         .  Multiple Vitamin (MULTIVITAMIN WITH MINERALS) TABS  Take 1 tablet by mouth  every evening.         .  NON FORMULARY  Hair Revitalizing formula take 2 tabs daily         .  omeprazole (PRILOSEC) 20 MG capsule  TAKE 1 CAPSULE TWICE DAILY   180 capsule   1   .  oxybutynin (DITROPAN-XL) 10 MG 24 hr tablet  Take 10 mg by mouth every evening.          .  potassium citrate (UROCIT-K 10) 10 MEQ (1080 MG) SR tablet  Take 20 mEq by mouth 2 (two) times daily.          .  QUEtiapine Fumarate (SEROQUEL XR) 150 MG TB24  Take 150-450 mg by mouth 2 (two) times daily. Take 1 every morning and 3  At bedtime         .  Thiamine HCl (VITAMIN B-1) 100 MG tablet  Take 100 mg by mouth every evening.          .  topiramate (TOPAMAX) 50 MG tablet  Take 1 tablet (50 mg total) by mouth 2 (two) times daily.    60 tablet   3   .  venlafaxine (EFFEXOR-XR) 150 MG 24 hr capsule  Take 150 mg by mouth every morning.               No current facility-administered medications for this visit.        Review of Systems  Constitutional: Negative for fever, chills and unexpected weight change.  HENT: Negative for hearing loss, congestion, sore throat, trouble swallowing and voice change.   Eyes: Negative for visual disturbance.  Respiratory: Negative for cough and wheezing.   Cardiovascular: Negative for chest pain, palpitations and leg swelling.  Gastrointestinal: Negative for nausea, vomiting, abdominal pain, diarrhea, constipation, blood in stool, abdominal distention and anal bleeding.  Genitourinary: Negative for hematuria, vaginal bleeding and difficulty urinating.  Musculoskeletal: Negative for arthralgias.  Skin: Negative for rash and wound.  Neurological: Negative for seizures, syncope and headaches.  Hematological: Negative for adenopathy. Does not bruise/bleed easily.  Psychiatric/Behavioral: Negative for confusion. The patient is nervous/anxious.       Blood pressure 150/86, pulse 73, temperature 96.6 F (35.9 C), temperature source Temporal, height 5\' 3"  (1.6 m), weight 256 lb 12.8 oz (116.484 kg), SpO2 98.00%.   Physical Exam  Constitutional: She is oriented to person, place, and time. She appears well-developed and well-nourished. No distress.  HENT:   Head: Normocephalic and atraumatic.   Nose: Nose normal.   Mouth/Throat: No oropharyngeal exudate.  Eyes: Conjunctivae and EOM are normal. Pupils are equal, round, and reactive to light. Left eye exhibits no discharge. No scleral icterus.  Neck: Neck supple. No JVD present. No tracheal deviation present. No thyromegaly present.  Thyroidectomy scar well healed. No adenopathy  Cardiovascular: Normal rate, regular rhythm, normal heart sounds and intact distal pulses.    No murmur heard. Pulmonary/Chest: Effort normal and breath  sounds normal. No respiratory distress. She has no wheezes. She has no rales. She exhibits no tenderness.    Biopsy site in bruise left breast, 11:00 position. Tiny hematoma. No other mass either breast. No axillary adenopathy on either side. Well-healed biopsy scar left breast, lower inner quadrant.  Abdominal: Soft. Bowel sounds are normal. She exhibits no distension and no mass. There is no tenderness. There is no rebound and no guarding.  Right subcostal incision well-healed.  Musculoskeletal: She exhibits no edema and no tenderness.  Lymphadenopathy:    She  has no cervical adenopathy.  Neurological: She is alert and oriented to person, place, and time. She exhibits normal muscle tone. Coordination normal.  Skin: Skin is warm. No rash noted. She is not diaphoretic. No erythema. No pallor.  Psychiatric: She has a normal mood and affect. Her behavior is normal. Judgment and thought content normal.      Data Reviewed Mammograms. Pathology report. Position meds.   Assessment     Abnormal mammogram left breast, upper inner quadrant with biopsy-proven biphasic lesion. Differential diagnosis includes benign papilloma, hamartoma, fibroadenoma. Noninvasive breast cancer risk here is 5-10% at most. Excision is reasonable.   Morbid obesity   Bipolar disorder   Isn't to diabetes mellitus   Status post multiple operations.   Negative family history for breast cancer     Plan    The patient strongly desires at this area be excised to exclude cancer. That is reasonable.   She'll be scheduled for left partial mastectomy with needle localization.   I've discussed indications, details, techniques, and numerous risk of surgery with her. I've explained to her the risk of bleeding, infection, reoperation, cosmetic deformity, skin necrosis, and other unforeseen problems. She understands all these issues well. All of her questions are answered. She agrees with this plan.            Angelia Mould. Derrell Lolling, M.D., [facility] General and Minimally invasive Surgery Breast and Colorectal Surgery Office:   [phone] Pager:   [phone]

## 2012-08-31 NOTE — Progress Notes (Signed)
Dr Katrinka Blazing notified of chart review for surgery

## 2012-09-01 ENCOUNTER — Ambulatory Visit (HOSPITAL_COMMUNITY): Payer: Medicare Other | Admitting: Anesthesiology

## 2012-09-01 ENCOUNTER — Ambulatory Visit (HOSPITAL_COMMUNITY)
Admission: RE | Admit: 2012-09-01 | Discharge: 2012-09-01 | Disposition: A | Discharge status: Home / Self Care | Payer: Medicare Other | Source: Ambulatory Visit | Attending: General Surgery | Admitting: General Surgery

## 2012-09-01 ENCOUNTER — Encounter (HOSPITAL_COMMUNITY)
Admission: RE | Disposition: A | Discharge status: Home / Self Care | Payer: Self-pay | Source: Ambulatory Visit | Attending: General Surgery

## 2012-09-01 ENCOUNTER — Encounter (HOSPITAL_COMMUNITY): Payer: Self-pay | Admitting: Anesthesiology

## 2012-09-01 ENCOUNTER — Ambulatory Visit
Admission: RE | Admit: 2012-09-01 | Discharge: 2012-09-01 | Disposition: A | Discharge status: Home / Self Care | Payer: Medicare Other | Source: Ambulatory Visit | Attending: General Surgery | Admitting: General Surgery

## 2012-09-01 DIAGNOSIS — Z79899 Other long term (current) drug therapy: Secondary | ICD-10-CM | POA: Insufficient documentation

## 2012-09-01 DIAGNOSIS — Z791 Long term (current) use of non-steroidal anti-inflammatories (NSAID): Secondary | ICD-10-CM | POA: Insufficient documentation

## 2012-09-01 DIAGNOSIS — E042 Nontoxic multinodular goiter: Secondary | ICD-10-CM | POA: Insufficient documentation

## 2012-09-01 DIAGNOSIS — K219 Gastro-esophageal reflux disease without esophagitis: Secondary | ICD-10-CM | POA: Insufficient documentation

## 2012-09-01 DIAGNOSIS — D509 Iron deficiency anemia, unspecified: Secondary | ICD-10-CM | POA: Insufficient documentation

## 2012-09-01 DIAGNOSIS — Z794 Long term (current) use of insulin: Secondary | ICD-10-CM | POA: Insufficient documentation

## 2012-09-01 DIAGNOSIS — R928 Other abnormal and inconclusive findings on diagnostic imaging of breast: Secondary | ICD-10-CM

## 2012-09-01 DIAGNOSIS — M069 Rheumatoid arthritis, unspecified: Secondary | ICD-10-CM | POA: Insufficient documentation

## 2012-09-01 DIAGNOSIS — I509 Heart failure, unspecified: Secondary | ICD-10-CM | POA: Insufficient documentation

## 2012-09-01 DIAGNOSIS — F4310 Post-traumatic stress disorder, unspecified: Secondary | ICD-10-CM | POA: Insufficient documentation

## 2012-09-01 DIAGNOSIS — Z87891 Personal history of nicotine dependence: Secondary | ICD-10-CM | POA: Insufficient documentation

## 2012-09-01 DIAGNOSIS — F319 Bipolar disorder, unspecified: Secondary | ICD-10-CM | POA: Insufficient documentation

## 2012-09-01 DIAGNOSIS — D249 Benign neoplasm of unspecified breast: Secondary | ICD-10-CM | POA: Insufficient documentation

## 2012-09-01 DIAGNOSIS — I5032 Chronic diastolic (congestive) heart failure: Secondary | ICD-10-CM | POA: Insufficient documentation

## 2012-09-01 DIAGNOSIS — Z780 Asymptomatic menopausal state: Secondary | ICD-10-CM | POA: Insufficient documentation

## 2012-09-01 DIAGNOSIS — E1149 Type 2 diabetes mellitus with other diabetic neurological complication: Secondary | ICD-10-CM | POA: Insufficient documentation

## 2012-09-01 DIAGNOSIS — E1142 Type 2 diabetes mellitus with diabetic polyneuropathy: Secondary | ICD-10-CM | POA: Insufficient documentation

## 2012-09-01 DIAGNOSIS — Z7982 Long term (current) use of aspirin: Secondary | ICD-10-CM | POA: Insufficient documentation

## 2012-09-01 HISTORY — PX: PARTIAL MASTECTOMY WITH NEEDLE LOCALIZATION: SHX6008

## 2012-09-01 LAB — GLUCOSE, CAPILLARY
Glucose-Capillary: 207 mg/dL — ABNORMAL HIGH (ref 70–99)
Glucose-Capillary: 211 mg/dL — ABNORMAL HIGH (ref 70–99)
Glucose-Capillary: 189 mg/dL — ABNORMAL HIGH (ref 70–99)

## 2012-09-01 SURGERY — PARTIAL MASTECTOMY WITH NEEDLE LOCALIZATION
Anesthesia: General | Site: Breast | Laterality: Left | Wound class: Clean

## 2012-09-01 MED ORDER — LACTATED RINGERS IV SOLN
INTRAVENOUS | Status: DC | PRN
  Administered 2012-09-01: 10:00:00 via INTRAVENOUS

## 2012-09-01 MED ORDER — EPHEDRINE SULFATE 50 MG/ML IJ SOLN
INTRAMUSCULAR | Status: DC | PRN
  Administered 2012-09-01: 10 mg via INTRAVENOUS

## 2012-09-01 MED ORDER — LIDOCAINE HCL (CARDIAC) 20 MG/ML IV SOLN
INTRAVENOUS | Status: DC | PRN
  Administered 2012-09-01: 100 mg via INTRAVENOUS

## 2012-09-01 MED ORDER — SCOPOLAMINE 1 MG/3DAYS TD PT72: 1.0000 | MEDICATED_PATCH | TRANSDERMAL | Status: DC

## 2012-09-01 MED ORDER — ARTIFICIAL TEARS OP OINT
TOPICAL_OINTMENT | OPHTHALMIC | Status: DC | PRN
  Administered 2012-09-01: 1 via OPHTHALMIC

## 2012-09-01 MED ORDER — PROPOFOL 10 MG/ML IV BOLUS
INTRAVENOUS | Status: DC | PRN
  Administered 2012-09-01: 50 mg via INTRAVENOUS
  Administered 2012-09-01: 150 mg via INTRAVENOUS
  Administered 2012-09-01: 50 mg via INTRAVENOUS

## 2012-09-01 MED ORDER — BUPIVACAINE-EPINEPHRINE PF 0.25-1:200000 % IJ SOLN
INTRAMUSCULAR | Status: AC
  Filled 2012-09-01: qty 30

## 2012-09-01 MED ORDER — ONDANSETRON HCL 4 MG/2ML IJ SOLN
INTRAMUSCULAR | Status: DC | PRN
  Administered 2012-09-01 (×2): 4 mg via INTRAVENOUS

## 2012-09-01 MED ORDER — LACTATED RINGERS IV SOLN: INTRAVENOUS | Status: DC | PRN

## 2012-09-01 MED ORDER — SCOPOLAMINE 1 MG/3DAYS TD PT72
MEDICATED_PATCH | TRANSDERMAL | Status: AC
  Administered 2012-09-01: 1.5 mg via TRANSDERMAL
  Filled 2012-09-01: qty 1

## 2012-09-01 MED ORDER — FENTANYL CITRATE 0.05 MG/ML IJ SOLN
INTRAMUSCULAR | Status: DC | PRN
  Administered 2012-09-01 (×4): 50 ug via INTRAVENOUS

## 2012-09-01 MED ORDER — HYDROCODONE-ACETAMINOPHEN 5-325 MG PO TABS: 1.0000 | ORAL_TABLET | ORAL | Status: DC | PRN

## 2012-09-01 MED ORDER — ROCURONIUM BROMIDE 100 MG/10ML IV SOLN
INTRAVENOUS | Status: DC | PRN
  Administered 2012-09-01: 40 mg via INTRAVENOUS

## 2012-09-01 MED ORDER — NEOSTIGMINE METHYLSULFATE 1 MG/ML IJ SOLN
INTRAMUSCULAR | Status: DC | PRN
  Administered 2012-09-01: 5 mg via INTRAVENOUS

## 2012-09-01 MED ORDER — 0.9 % SODIUM CHLORIDE (POUR BTL) OPTIME
TOPICAL | Status: DC | PRN
  Administered 2012-09-01: 1000 mL

## 2012-09-01 MED ORDER — CHLORHEXIDINE GLUCONATE 4 % EX LIQD: 1.0000 "application " | Freq: Once | CUTANEOUS | Status: DC

## 2012-09-01 MED ORDER — BUPIVACAINE-EPINEPHRINE 0.25% -1:200000 IJ SOLN
INTRAMUSCULAR | Status: DC | PRN
  Administered 2012-09-01: 10 mL

## 2012-09-01 MED ORDER — GLYCOPYRROLATE 0.2 MG/ML IJ SOLN
INTRAMUSCULAR | Status: DC | PRN
  Administered 2012-09-01: .8 mg via INTRAVENOUS

## 2012-09-01 MED ORDER — MIDAZOLAM HCL 5 MG/5ML IJ SOLN
INTRAMUSCULAR | Status: DC | PRN
  Administered 2012-09-01 (×2): 1 mg via INTRAVENOUS

## 2012-09-01 MED ORDER — LACTATED RINGERS IV SOLN
INTRAVENOUS | Status: DC
  Administered 2012-09-01: 10:00:00 via INTRAVENOUS

## 2012-09-01 SURGICAL SUPPLY — 45 items
BLADE SURG 15 STRL LF DISP TIS (BLADE) ×1 IMPLANT
BLADE SURG 15 STRL SS (BLADE) ×1
CANISTER SUCTION 2500CC (MISCELLANEOUS) ×2 IMPLANT
CHLORAPREP W/TINT 26ML (MISCELLANEOUS) ×2 IMPLANT
CLOTH BEACON ORANGE TIMEOUT ST (SAFETY) ×2 IMPLANT
COVER SURGICAL LIGHT HANDLE (MISCELLANEOUS) ×2 IMPLANT
DERMABOND ADHESIVE PROPEN (GAUZE/BANDAGES/DRESSINGS) ×1
DERMABOND ADVANCED (GAUZE/BANDAGES/DRESSINGS) ×1
DERMABOND ADVANCED .7 DNX12 (GAUZE/BANDAGES/DRESSINGS) ×1 IMPLANT
DERMABOND ADVANCED .7 DNX6 (GAUZE/BANDAGES/DRESSINGS) ×1 IMPLANT
DEVICE DUBIN SPECIMEN MAMMOGRA (MISCELLANEOUS) ×2 IMPLANT
DRAPE CHEST BREAST 15X10 FENES (DRAPES) ×2 IMPLANT
DRAPE UTILITY 15X26 W/TAPE STR (DRAPE) ×4 IMPLANT
ELECT CAUTERY BLADE 6.4 (BLADE) ×2 IMPLANT
ELECT REM PT RETURN 9FT ADLT (ELECTROSURGICAL) ×2
ELECTRODE REM PT RTRN 9FT ADLT (ELECTROSURGICAL) ×1 IMPLANT
GLOVE BIO SURGEON STRL SZ7.5 (GLOVE) ×2 IMPLANT
GLOVE BIOGEL PI IND STRL 7.0 (GLOVE) ×2 IMPLANT
GLOVE BIOGEL PI IND STRL 8 (GLOVE) ×1 IMPLANT
GLOVE BIOGEL PI INDICATOR 7.0 (GLOVE) ×2
GLOVE BIOGEL PI INDICATOR 8 (GLOVE) ×1
GLOVE EUDERMIC 7 POWDERFREE (GLOVE) ×4 IMPLANT
GLOVE SURG SS PI 7.0 STRL IVOR (GLOVE) ×2 IMPLANT
GOWN PREVENTION PLUS XLARGE (GOWN DISPOSABLE) ×2 IMPLANT
GOWN STRL NON-REIN LRG LVL3 (GOWN DISPOSABLE) ×4 IMPLANT
KIT BASIN OR (CUSTOM PROCEDURE TRAY) ×2 IMPLANT
KIT MARKER MARGIN INK (KITS) IMPLANT
KIT ROOM TURNOVER OR (KITS) ×2 IMPLANT
NEEDLE HYPO 25GX1X1/2 BEV (NEEDLE) ×2 IMPLANT
NS IRRIG 1000ML POUR BTL (IV SOLUTION) ×2 IMPLANT
PACK SURGICAL SETUP 50X90 (CUSTOM PROCEDURE TRAY) ×2 IMPLANT
PAD ARMBOARD 7.5X6 YLW CONV (MISCELLANEOUS) ×2 IMPLANT
PENCIL BUTTON HOLSTER BLD 10FT (ELECTRODE) ×2 IMPLANT
SPONGE GAUZE 4X4 12PLY (GAUZE/BANDAGES/DRESSINGS) ×2 IMPLANT
SPONGE LAP 18X18 X RAY DECT (DISPOSABLE) ×2 IMPLANT
SUT MNCRL AB 4-0 PS2 18 (SUTURE) ×2 IMPLANT
SUT SILK 2 0 SH (SUTURE) ×2 IMPLANT
SUT VIC AB 3-0 SH 18 (SUTURE) ×2 IMPLANT
SYR BULB 3OZ (MISCELLANEOUS) ×2 IMPLANT
SYR CONTROL 10ML LL (SYRINGE) ×2 IMPLANT
TAPE CLOTH SURG 4X10 WHT LF (GAUZE/BANDAGES/DRESSINGS) ×2 IMPLANT
TOWEL OR 17X24 6PK STRL BLUE (TOWEL DISPOSABLE) ×2 IMPLANT
TOWEL OR 17X26 10 PK STRL BLUE (TOWEL DISPOSABLE) ×2 IMPLANT
TUBE CONNECTING 12X1/4 (SUCTIONS) ×2 IMPLANT
YANKAUER SUCT BULB TIP NO VENT (SUCTIONS) ×2 IMPLANT

## 2012-09-01 NOTE — Preoperative (Signed)
Beta Blockers   Reason not to administer Beta Blockers:Not Applicable 

## 2012-09-01 NOTE — Progress Notes (Signed)
Prescription given to family member........getting dressed at this moment...Marland KitchenDA

## 2012-09-01 NOTE — Anesthesia Preprocedure Evaluation (Addendum)
Anesthesia Evaluation    History of Anesthesia Complications (+) PONV  Airway       Dental   Pulmonary neg pulmonary ROS, former smoker,          Cardiovascular hypertension, + Peripheral Vascular Disease   Normal stress nuclear study and  nl echo in 2012    Neuro/Psych PSYCHIATRIC DISORDERS Depression Bipolar Disorder negative neurological ROS     GI/Hepatic Neg liver ROS, GERD-  ,  Endo/Other  diabetes, Type 2Hypothyroidism   Renal/GU negative Renal ROS     Musculoskeletal  (+) Arthritis -, Fibromyalgia -  Abdominal   Peds  Hematology negative hematology ROS (+)   Anesthesia Other Findings   Reproductive/Obstetrics                          Anesthesia Physical Anesthesia Plan  ASA: III  Anesthesia Plan: General   Post-op Pain Management:    Induction: Intravenous  Airway Management Planned: Oral ETT  Additional Equipment:   Intra-op Plan:   Post-operative Plan: Extubation in OR  Informed Consent:   Plan Discussed with: CRNA, Anesthesiologist and Surgeon  Anesthesia Plan Comments:        Anesthesia Quick Evaluation

## 2012-09-01 NOTE — Anesthesia Procedure Notes (Signed)
Procedure Name: Intubation Date/Time: 09/01/2012 10:01 AM Performed by: Sherie Don Pre-anesthesia Checklist: Patient identified, Emergency Drugs available, Suction available, Patient being monitored and Timeout performed Patient Re-evaluated:Patient Re-evaluated prior to inductionOxygen Delivery Method: Circle system utilized Preoxygenation: Pre-oxygenation with 100% oxygen Intubation Type: IV induction Ventilation: Mask ventilation without difficulty and Oral airway inserted - appropriate to patient size Laryngoscope Size: Mac and 3 Grade View: Grade II Tube type: Oral Tube size: 7.5 mm Number of attempts: 1 Airway Equipment and Method: Stylet Placement Confirmation: ETT inserted through vocal cords under direct vision,  positive ETCO2 and breath sounds checked- equal and bilateral Secured at: 22 cm Tube secured with: Tape Dental Injury: Teeth and Oropharynx as per pre-operative assessment

## 2012-09-01 NOTE — Interval H&P Note (Signed)
History and Physical Interval Note:  09/01/2012 9:33 AM  Brittany Acosta  has presented today for surgery, with the diagnosis of breast abdnormal mgm  The goals and the  various methods of treatment have been discussed with the patient and family. After consideration of risks, benefits and other options for treatment, the patient has consented to  Procedure(s): PARTIAL MASTECTOMY WITH NEEDLE LOCALIZATION (Left) as a surgical intervention .  The patient's history has been reviewed, patient examined, no change in status, stable for surgery.  I have reviewed the patient's chart and labs.  Questions were answered to the patient's satisfaction.     Ernestene Mention

## 2012-09-01 NOTE — Transfer of Care (Signed)
Immediate Anesthesia Transfer of Care Note  Patient: Brittany Acosta  Procedure(s) Performed: Procedure(s): PARTIAL MASTECTOMY WITH NEEDLE LOCALIZATION (Left)  Patient Location: PACU  Anesthesia Type:General  Level of Consciousness: awake, oriented and patient cooperative  Airway & Oxygen Therapy: Patient Spontanous Breathing and Patient connected to face mask oxygen  Post-op Assessment: Report given to PACU RN, Post -op Vital signs reviewed and stable and Patient moving all extremities X 4  Post vital signs: Reviewed and stable  Complications: No apparent anesthesia complications

## 2012-09-01 NOTE — Op Note (Signed)
Patient Name:           Brittany Acosta   Date of Surgery:        09/01/2012  Pre op Diagnosis:      Abnormal mammogram left breast, upper inner quadrant with biopsy-proven biphasic lesion  Post op Diagnosis:    Same  Procedure:                 Left partial mastectomy with needle localization and margin assessment  Surgeon:                     Angelia Mould. Derrell Lolling, M.D., FACS  Assistant:                      None  Operative Indications:   Brittany Acosta is a 62 y.o. female. She is referred by Dr. Juel Burrow at the breast center of [facility] for evaluation of an abnormal mammogram of the left breast. Rene Paci is her PCP.  The patient has a minimal history of breast problems. She did have a left breast biopsy in the lower inner quadrant many years ago for benign process. Recent screening mammograms led to focused left mammogram and ultrasound. This shows a persistent, solid, lobulated 1.1 cm lesion in the left breast at the 10:00 position 8 cm from the nipple. Image guided biopsy shows a biphasic lesion, with differential diagnosis of intraductal papilloma, hamartoma, and fibroadenoma. Pathology stated that surgical excision may help better evaluate this lesion.  Comorbidities include morbid obesity, bipolar disorder, insulin-dependent diabetes mellitus, as it had multiple surgeries including open cholecystectomy, bilateral total knee replacement, thyroidectomy, hysterectomy and BSO for endometriosis, right shoulder surgery. The patient desires that this area be excised. Family history is negative for breast cancer. r. Lots of cardiovascular disease in the family.    Operative Findings:       The wire was placed in the upper inner quadrant of the left breast and directed from medial to lateral. The specimen mammogram looked good with the marker clip and the wire essentially centered within the specimen. There was no gross abnormality by physical exam.  Procedure in Detail:          Following the  placement of the localizing wire, the patient was brought to the operating room at [facility] hospital and general anesthesia was induced. The left breast was prepped and draped in a sterile fashion and intravenous antibiotics were given, after which a surgical time out was performed. 0.5% Marcaine with epinephrine was used as a local infiltration anesthetic. A transverse, somewhat radially oriented incision was made starting at the insertion site of the wire and directed medially. Dissection was carried down to the breast tissue and around the localizing wire. The specimen was removed and the superior, lateral, and deep margins were marked with silk sutures. Specimen mammogram looked good as described above. The specimen was marked and sent to the pathology lab. Hemostasis was excellent and achieved with electrocautery and the wound was irrigated with saline. The breast tissues were closed in several layers with interrupted sutures of 3-0 Vicryl and the skin closed with a running subcuticular suture of 4-0 Monocryl and Dermabond. Patient tolerated procedure well was taken to recovery in stable condition. EBL 10 cc. Counts correct. Complications none.     Angelia Mould. Derrell Lolling, M.D., FACS General and Minimally Invasive Surgery Breast and Colorectal Surgery  09/01/2012 10:49 AM

## 2012-09-01 NOTE — Anesthesia Postprocedure Evaluation (Signed)
Anesthesia Post Note  Patient: Brittany Acosta  Procedure(s) Performed: Procedure(s) (LRB): PARTIAL MASTECTOMY WITH NEEDLE LOCALIZATION (Left)  Anesthesia type: general  Patient location: PACU  Post pain: Pain level controlled  Post assessment: Patient's Cardiovascular Status Stable  Last Vitals:  Filed Vitals:   09/01/12 1200  BP: 132/68  Pulse: 85  Temp: 36.6 C  Resp: 16    Post vital signs: Reviewed and stable  Level of consciousness: sedated  Complications: No apparent anesthesia complications

## 2012-09-04 ENCOUNTER — Telehealth (INDEPENDENT_AMBULATORY_CARE_PROVIDER_SITE_OTHER): Payer: Self-pay | Admitting: General Surgery

## 2012-09-04 ENCOUNTER — Encounter (HOSPITAL_COMMUNITY): Payer: Self-pay | Admitting: General Surgery

## 2012-09-04 NOTE — Telephone Encounter (Signed)
Spoke with patient she is aware of PO f/u visit

## 2012-09-05 ENCOUNTER — Telehealth (INDEPENDENT_AMBULATORY_CARE_PROVIDER_SITE_OTHER): Payer: Self-pay

## 2012-09-05 NOTE — Telephone Encounter (Signed)
Patient called in requesting path results. I looked and told her that her path was not in yet. I explained I talked to pathology today and there is a 1-2 day delay in getting results to patients right now. I explained we will call her when we receive them.

## 2012-09-06 ENCOUNTER — Ambulatory Visit: Payer: Medicare Other | Admitting: Endocrinology

## 2012-09-07 NOTE — Telephone Encounter (Signed)
Patient called for her pathology results.  I pulled them up and gave them to her.  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  She has a follow up appointment.  She asked if she can swim.  I told her not for 3 to 4 weeks once her incisions are healed and no open areas.

## 2012-09-13 ENCOUNTER — Encounter: Payer: Self-pay | Admitting: Internal Medicine

## 2012-09-13 ENCOUNTER — Ambulatory Visit (INDEPENDENT_AMBULATORY_CARE_PROVIDER_SITE_OTHER): Payer: Medicare Other | Admitting: Internal Medicine

## 2012-09-13 VITALS — BP 140/70 | HR 82 | Temp 97.1°F | Ht 63.5 in | Wt 257.0 lb

## 2012-09-13 DIAGNOSIS — G629 Polyneuropathy, unspecified: Secondary | ICD-10-CM | POA: Insufficient documentation

## 2012-09-13 DIAGNOSIS — G609 Hereditary and idiopathic neuropathy, unspecified: Secondary | ICD-10-CM

## 2012-09-13 MED ORDER — TRAMADOL HCL (ER BIPHASIC) 100 MG PO CP24: 1.0000 | ORAL_CAPSULE | Freq: Every day | ORAL | Status: DC

## 2012-09-13 NOTE — Patient Instructions (Addendum)
Please take all new medication as prescribed Please continue all other medications as before  Please keep your appointments with your specialists as you have planned, and Dr Felicity Coyer in September, 2014  Please remember to sign up for My Chart if you have not done so, as this will be important to you in the future with finding out test results, communicating by private email, and scheduling acute appointments online when needed.

## 2012-09-13 NOTE — Assessment & Plan Note (Signed)
With pain - Ideally would like to try cymbalta, but already on mult psychoactive meds, for ultram 100 ER qd prn,  to f/u any worsening symptoms or concerns j

## 2012-09-13 NOTE — Progress Notes (Signed)
Subjective:    Patient ID: Brittany Acosta, female    DOB: 1951-01-07, 62 y.o.   MRN: [id number]  HPI  Here to f/u, pt of Dr L, [place]. Pt c/o 1 mo worsening bilat neuropathic type distal and prox leg burning pain with pins and needles, no back pain or other leg weakness or numbness.  Still off the mobic and asa since her breast surgury last wk (non malignant) but had the discomfort prior to that , Out of norco from last wk, worked ok for legs as well.. S/p laser surgury to right eye yesterday, but she thinks related to cataract, not DM.  Past Medical History  Diagnosis Date  . GOITER, MULTINODULAR   . HYPOTHYROIDISM   . HYPERCHOLESTEROLEMIA   . Morbid obesity   . BIPOLAR DISORDER UNSPECIFIED     depression  . Chronic diastolic heart failure   . ALLERGIC RHINITIS   . APHTHOUS STOMATITIS   . TRANSAMINASES, SERUM, ELEVATED   . ASYMPTOMATIC POSTMENOPAUSAL STATUS   . FIBROMYALGIA   . GERD (gastroesophageal reflux disease)   . History of wrist fracture     rt wrist  . Depression   . PONV (postoperative nausea and vomiting)   . Type II diabetes mellitus   . Neuropathy due to secondary diabetes   . Iron deficiency anemia   . Kidney stones     sees urologist @ [facility]  . External bleeding hemorrhoids   . Rheumatoid arthritis(714.0)     knees and shoulders  . Fibromyalgia   . History of gout     "haven't had it in several years" (02/08/2012)  . PTSD (post-traumatic stress disorder)   . Shortness of breath    Past Surgical History  Procedure Laterality Date  . Cholecystectomy  1985  . Abdominal hysterectomy  1976  . Tubal ligation  1972  . Right nasal surgery  08/1988  . Right sinus removed  08/1989    tooth partial  . Percitania stone removed (l) kidney  1992  . Lithotripsy (l) kidney  1997  . Left cystoscopy   1990  . Thyroidectomy  04/22/2011    Procedure: THYROIDECTOMY;  Surgeon: Antony Contras, MD;  Location: [facility] OR;  Service: ENT;  Laterality: N/A;   TOTAL THYROIDECOTMY  . Cataract extraction, bilateral  02/2011    epps  . Total knee arthroplasty  06/18/2011    Procedure: TOTAL KNEE ARTHROPLASTY;  Surgeon: Nestor Lewandowsky, MD;  Location: [facility] OR;  Service: Orthopedics;  Laterality: Left;  DEPUY  . Total knee arthroplasty  02/07/2012    Procedure: TOTAL KNEE ARTHROPLASTY;  Surgeon: Nestor Lewandowsky, MD;  Location: [facility] OR;  Service: Orthopedics;  Laterality: Right;  . Incisional breast biopsy  2000    right  . Cardiac catheterization  2001    sees Dr Peter [place]and  . Knee arthroscopy  08/2009    right  . Knee arthroscopy with medial menisectomy      left  . Excisional hemorrhoidectomy      "dr cut out in his office" (02/08/2012)  . Left oophorectomy  1980  . Rotator cuff repair  2013    right shoulder x 2  . Joint replacement      rt knee  . Partial mastectomy with needle localization Left 09/01/2012    Procedure: PARTIAL MASTECTOMY WITH NEEDLE LOCALIZATION;  Surgeon: Ernestene Mention, MD;  Location: [facility] OR;  Service: General;  Laterality: Left;  reports that she quit smoking about 29 years ago. Her smoking use included Cigarettes. She has a 6 pack-year smoking history. She has never used smokeless tobacco. She reports that she does not drink alcohol or use illicit drugs. family history includes Coronary artery disease in her other; Diabetes in her father; Heart attack in her other; Hyperlipidemia in her father; and Hypertension in her father and mother. No Known Allergies Current Outpatient Prescriptions on File Prior to Visit  Medication Sig Dispense Refill  . Alpha-Lipoic Acid 300 MG TABS Take 300 mg by mouth daily.       . ARIPiprazole (ABILIFY) 20 MG tablet Take 20 mg by mouth at bedtime.       Marland Kitchen aspirin 81 MG tablet Take 81 mg by mouth daily.      Marland Kitchen atorvastatin (LIPITOR) 20 MG tablet Take 1 tablet (20 mg total) by mouth daily.  90 tablet  3  . BIOTIN PO Take 1 tablet by mouth 3 (three) times daily.      . Cholecalciferol (VITAMIN D3)  400 UNITS CAPS Take 400 Units by mouth daily.       . clonazePAM (KLONOPIN) 1 MG tablet Take 2 mg by mouth at bedtime.       . Coenzyme Q10 120 MG CAPS Take 120 mg by mouth daily.      Marland Kitchen estrogens, conjugated, (PREMARIN) 0.3 MG tablet Take 0.3 mg by mouth daily.      . furosemide (LASIX) 40 MG tablet Take 40 mg by mouth daily.      . hydrochlorothiazide 25 MG tablet Take 25 mg by mouth every morning.       . insulin NPH (HUMULIN N,NOVOLIN N) 100 UNIT/ML injection Inject 35 Units into the skin 2 (two) times daily.  7 vial  3  . Lactobacillus (ULTIMATE PROBIOTIC FORMULA PO) Take 1 capsule by mouth every evening.       . lamoTRIgine (LAMICTAL) 200 MG tablet Take 200 mg by mouth daily at 12 noon.       Marland Kitchen levothyroxine (SYNTHROID, LEVOTHROID) 50 MCG tablet Take 1 tablet (50 mcg total) by mouth daily.  90 tablet  1  . Magnesium 200 MG TABS Take 200 mg by mouth daily.      . Manganese Gluconate 50 MG TABS Take 50 mg by mouth every evening.       . meloxicam (MOBIC) 15 MG tablet Take 15 mg by mouth daily.       . Multiple Vitamin (MULTIVITAMIN WITH MINERALS) TABS Take 1 tablet by mouth every evening.      . neomycin-bacitracin-polymyxin (NEOSPORIN) ointment Apply 1 application topically every evening.      Marland Kitchen omeprazole (PRILOSEC) 20 MG capsule Take 20 mg by mouth 2 (two) times daily.      Marland Kitchen oxybutynin (DITROPAN-XL) 10 MG 24 hr tablet Take 10 mg by mouth every evening.       . potassium citrate (UROCIT-K 10) 10 MEQ (1080 MG) SR tablet Take 20 mEq by mouth 2 (two) times daily.       . QUEtiapine Fumarate (SEROQUEL XR) 150 MG TB24 Take 150-450 mg by mouth 2 (two) times daily. Take 1 every morning and 3  At bedtime      . Thiamine HCl (VITAMIN B-1) 100 MG tablet Take 100 mg by mouth every evening.       . venlafaxine (EFFEXOR-XR) 150 MG 24 hr capsule Take 150 mg by mouth every morning.  No current facility-administered medications on file prior to visit.   Review of Systems All otherwise neg per  pt     Objective:   Physical Exam BP 140/70  Pulse 82  Temp(Src) 97.1 F (36.2 C) (Oral)  Ht 5' 3.5" (1.613 m)  Wt 257 lb (116.574 kg)  BMI 44.81 kg/m2  SpO2 96% VS noted,  Constitutional: Pt appears well-developed and well-nourished.  HENT: Head: NCAT.  Right Ear: External ear normal.  Left Ear: External ear normal.  Eyes: Conjunctivae and EOM are normal. Pupils are equal, round, and reactive to light.  Neck: Normal range of motion. Neck supple.  Cardiovascular: Normal rate and regular rhythm.   Pulmonary/Chest: Effort normal and breath sounds normal.  Abd:  Soft, NT, non-distended, + BS Neurological: Pt is alert. Not confused , motor 5/5 LE Skin: Skin is warm. No erythema. , some decr sens to LT to legs to mid thigh Spine nontender, no sweling, erythema, rash Psychiatric: Pt behavior is normal. Thought content normal. mild nervous     Assessment & Plan:

## 2012-09-18 ENCOUNTER — Other Ambulatory Visit: Payer: Self-pay | Admitting: Internal Medicine

## 2012-09-21 ENCOUNTER — Ambulatory Visit (INDEPENDENT_AMBULATORY_CARE_PROVIDER_SITE_OTHER): Payer: Medicare Other | Admitting: General Surgery

## 2012-09-21 ENCOUNTER — Encounter (INDEPENDENT_AMBULATORY_CARE_PROVIDER_SITE_OTHER): Payer: Self-pay | Admitting: General Surgery

## 2012-09-21 VITALS — BP 130/74 | HR 80 | Temp 98.6°F | Resp 16 | Ht 63.5 in | Wt 259.4 lb

## 2012-09-21 DIAGNOSIS — R928 Other abnormal and inconclusive findings on diagnostic imaging of breast: Secondary | ICD-10-CM

## 2012-09-21 NOTE — Progress Notes (Signed)
Patient ID: Brittany Acosta, female   DOB: Nov 02, 1950, 62 y.o.   MRN: [id number] History: This patient underwent excisional biopsy of a biphasic lesion in the upper left breast on 09/01/2012. Final pathology report shows benign intraductal papilloma, benign fibrocystic disease. No atypia or malignancy. I discussed the pathology report with her and gave her a copy. She's not had any wound problems.  Exam: Patient looks well. No distress Left breast examined. The incision is healing without signs of infection or hematoma. Normal amount of incisional thickening.  Assessment abnormal mammogram left breast. Benign intraductal papilloma and fibrocystic disease on final pathology following lumpectomy  Plan: The patient was reassured Annual breast exam and annual mammogram by her primary care physician Return to see me if further problems arise   Angelia Mould. Derrell Lolling, M.D., [facility] General and Minimally invasive Surgery Breast and Colorectal Surgery Office:   [phone] Pager:   [phone]

## 2012-09-21 NOTE — Patient Instructions (Signed)
Your left breast biopsy wound appears to be healing without any surgical complications. The thickening and the wound will slowly resolve over the next several months.  Be sure to get annual mammograms and annual breast exam by your physician  Return to see Dr. Derrell Lolling if further problems arise

## 2012-09-27 ENCOUNTER — Ambulatory Visit (INDEPENDENT_AMBULATORY_CARE_PROVIDER_SITE_OTHER): Payer: Medicare Other | Admitting: Endocrinology

## 2012-09-27 ENCOUNTER — Encounter: Payer: Self-pay | Admitting: Endocrinology

## 2012-09-27 ENCOUNTER — Ambulatory Visit: Payer: Medicare Other | Admitting: Neurology

## 2012-09-27 ENCOUNTER — Encounter: Payer: Self-pay | Admitting: Neurology

## 2012-09-27 VITALS — BP 140/76 | HR 88 | Temp 97.8°F | Ht 63.5 in | Wt 261.0 lb

## 2012-09-27 VITALS — BP 126/82 | HR 100 | Ht 63.0 in | Wt 261.0 lb

## 2012-09-27 DIAGNOSIS — E1049 Type 1 diabetes mellitus with other diabetic neurological complication: Secondary | ICD-10-CM

## 2012-09-27 DIAGNOSIS — G43119 Migraine with aura, intractable, without status migrainosus: Secondary | ICD-10-CM

## 2012-09-27 DIAGNOSIS — G43819 Other migraine, intractable, without status migrainosus: Secondary | ICD-10-CM

## 2012-09-27 DIAGNOSIS — G4730 Sleep apnea, unspecified: Secondary | ICD-10-CM

## 2012-09-27 DIAGNOSIS — E1065 Type 1 diabetes mellitus with hyperglycemia: Secondary | ICD-10-CM

## 2012-09-27 LAB — HEMOGLOBIN A1C: Hgb A1c MFr Bld: 8.4 % — ABNORMAL HIGH (ref 4.6–6.5)

## 2012-09-27 NOTE — Patient Instructions (Addendum)
Follow up in our office in 3 months with Dr. Everlena Cooper.  Continue the Topamax as directed.  Your appointment for the sleep study is scheduled for Wednesday, July 9th at 8:00 pm at the [facility].  They will mail information to your home about this test.  [phone]

## 2012-09-27 NOTE — Patient Instructions (Addendum)
blood and urine tests are being requested for you today.  We'll contact you with results.  Please come back for a follow-up appointment in 3 months.   on this type of insulin, you should eat lunch no later than 4 hrs after breakfast.  Otherwise, your blood sugar could go low.   check your blood sugar twice a day.  vary the time of day when you check, between before the 3 meals, and at bedtime.  also check if you have symptoms of your blood sugar being too high or too low.  please keep a record of the readings and bring it to your next appointment here.  please call us sooner if your blood sugar goes below 70, or if it stays over 200.

## 2012-09-27 NOTE — Progress Notes (Unsigned)
Brittany Acosta returns for followup of right-sided throbbing headaches which are currently happening about every other day.  She is now taking Topamax 50 mg at night.  She also mentioned that she has gained weight and that she snores and she has daytime fatigue and sleepiness.  She states that she has not had a sleep study to rule out sleep apnea.  The headache persists and can be pre-strong. She is taking tramadol 100 mg if needed.  Headaches are throbbing in quality.  She is happily back on the Seroquel and Topamax.  She states that when she stopped these 2+ mirtazapine, her body had a funny reaction from withdrawing the medications.  She did not try the Relpax medication for the migraine-like headache.  Review of systems is positive for poor concentration memory, snoring, fatigue and daytime sleepiness, headaches and depression as well as aches and pains diffusely.  Past Medical History  Diagnosis Date  . GOITER, MULTINODULAR   . HYPOTHYROIDISM   . HYPERCHOLESTEROLEMIA   . Morbid obesity   . BIPOLAR DISORDER UNSPECIFIED     depression  . Chronic diastolic heart failure   . ALLERGIC RHINITIS   . APHTHOUS STOMATITIS   . TRANSAMINASES, SERUM, ELEVATED   . ASYMPTOMATIC POSTMENOPAUSAL STATUS   . FIBROMYALGIA   . GERD (gastroesophageal reflux disease)   . History of wrist fracture     rt wrist  . Depression   . PONV (postoperative nausea and vomiting)   . Type II diabetes mellitus   . Neuropathy due to secondary diabetes   . Iron deficiency anemia   . Kidney stones     sees urologist @ [facility]  . External bleeding hemorrhoids   . Rheumatoid arthritis(714.0)     knees and shoulders  . Fibromyalgia   . History of gout     "haven't had it in several years" (02/08/2012)  . PTSD (post-traumatic stress disorder)   . Shortness of breath     Current Outpatient Prescriptions on File Prior to Visit  Medication Sig Dispense Refill  . Alpha-Lipoic Acid 300 MG TABS Take 300 mg by mouth daily.       .  ARIPiprazole (ABILIFY) 20 MG tablet Take 20 mg by mouth at bedtime.       Marland Kitchen aspirin 81 MG tablet Take 81 mg by mouth daily.      Marland Kitchen atorvastatin (LIPITOR) 20 MG tablet Take 1 tablet (20 mg total) by mouth daily.  90 tablet  3  . BIOTIN PO Take 1 tablet by mouth 3 (three) times daily.      . Cholecalciferol (VITAMIN D3) 400 UNITS CAPS Take 400 Units by mouth daily.       . clonazePAM (KLONOPIN) 1 MG tablet Take 2 mg by mouth at bedtime.       . Coenzyme Q10 120 MG CAPS Take 120 mg by mouth daily.      Marland Kitchen estrogens, conjugated, (PREMARIN) 0.3 MG tablet Take 0.3 mg by mouth daily.      . furosemide (LASIX) 40 MG tablet Take 40 mg by mouth daily.      . hydrochlorothiazide 25 MG tablet Take 25 mg by mouth every morning.       . insulin NPH (HUMULIN N,NOVOLIN N) 100 UNIT/ML injection Inject 35 Units into the skin 2 (two) times daily.  7 vial  3  . Lactobacillus (ULTIMATE PROBIOTIC FORMULA PO) Take 1 capsule by mouth every evening.       . lamoTRIgine (LAMICTAL) 200 MG tablet  Take 200 mg by mouth daily at 12 noon.       . Magnesium 200 MG TABS Take 200 mg by mouth daily.      . Manganese Gluconate 50 MG TABS Take 50 mg by mouth every evening.       . meloxicam (MOBIC) 15 MG tablet Take 15 mg by mouth daily.       . Multiple Vitamin (MULTIVITAMIN WITH MINERALS) TABS Take 1 tablet by mouth every evening.      . neomycin-bacitracin-polymyxin (NEOSPORIN) ointment Apply 1 application topically every evening.      Marland Kitchen omeprazole (PRILOSEC) 20 MG capsule Take 20 mg by mouth 2 (two) times daily.      Marland Kitchen oxybutynin (DITROPAN-XL) 10 MG 24 hr tablet Take 10 mg by mouth every evening.       . potassium citrate (UROCIT-K 10) 10 MEQ (1080 MG) SR tablet Take 20 mEq by mouth 2 (two) times daily.       Marland Kitchen PREMARIN 0.3 MG tablet TAKE 1 TABLET DAILY  90 tablet  1  . QUEtiapine Fumarate (SEROQUEL XR) 150 MG TB24 Take 150-450 mg by mouth 2 (two) times daily. Take 1 every morning and 3  At bedtime      . SYNTHROID 50 MCG  tablet TAKE 1 TABLET DAILY  90 tablet  1  . Thiamine HCl (VITAMIN B-1) 100 MG tablet Take 100 mg by mouth every evening.       . TraMADol HCl 100 MG CP24 Take 1 tablet by mouth daily.  30 capsule  2  . venlafaxine (EFFEXOR-XR) 150 MG 24 hr capsule Take 150 mg by mouth every morning.         No current facility-administered medications on file prior to visit.   Review of patient's allergies indicates no known allergies.  History   Social History  . Marital Status: Married    Spouse Name: N/A    Number of Children: 2  . Years of Education: N/A   Occupational History  . disability    Social History Main Topics  . Smoking status: Former Smoker -- 1.00 packs/day for 6 years    Types: Cigarettes    Quit date: 04/06/1983  . Smokeless tobacco: Never Used     Comment: Married, lives with spouse. Disable- 2 grown kids-6 g-kids  . Alcohol Use: No     Comment: none  . Drug Use: No  . Sexually Active: Not Currently   Other Topics Concern  . Not on file   Social History Narrative   Married, lives with spouse - 2 adult children   diabled    Family History  Problem Relation Age of Onset  . Hypertension Mother   . Diabetes Father   . Hypertension Father   . Hyperlipidemia Father   . Heart attack Other   . Coronary artery disease Other    BP 140/76  Pulse 88  Temp(Src) 97.8 F (36.6 C)  Ht 5' 3.5" (1.613 m)  Wt 261 lb (118.389 kg)  BMI 45.5 kg/m2   Morbidly obese woman.  Alert and oriented x 3.  Memory function appears to be fair with slow recall.  Concentration and attention are poor.  Speech is fluent and with some word finding difficulty.  Is aware of current events.  No carotid bruits detected.  Cranial nerve II through XII are within normal limits.  This includes normal optic discs and acuity, EOMI, PERLA, facial movement and sensation intact, hearing grossly intact, gag intact,Uvula  raises symmetrically and tongue protrudes evenly. Motor strength is 5 over 5 throughout  all limbs.  No atrophy, abnormal tone or tremors. Reflexes are 1-2+ and symmetric in the upper and lower extremities Sensory exam is intact. Coordination is intact for fine movements and rapid alternating movements in all limbs Gait and station are normal.   Impression: 1. Ongoing right-sided atypical migraine-like headaches.  Slightly improved if at all but now back on Topamax 50 mg at night.  She has been acupuncture twice but she doesn't think she can afford to continue. 2. Obesity, fatigue and daytime sleepiness with observe snoring.  Plan: 1. She can try the Axert to see if her migraine-like headaches respond. 2. We will refer her to sleep clinic on needle exam for evaluation and sleep studies to rule out sleep apnea. 3. Return in 3 months

## 2012-09-27 NOTE — Progress Notes (Signed)
Subjective:    Patient ID: Brittany Acosta, female    DOB: [birth year], 62 y.o.   MRN: [id number]  HPI Pt returns for f/u of insulin-requiring DM (dx'ed 2002, complicated by peripheral sensory neuropathy; therapy limited by pt's need for a simple, inexpensive regimen; she has never had severe hypoglycemia or DKA).  she brings a record of her cbg's which i have reviewed today.  It varies from 86-220, but most are in the 100's.  There is no trend throughout the day.  Past Medical History  Diagnosis Date  . GOITER, MULTINODULAR   . HYPOTHYROIDISM   . HYPERCHOLESTEROLEMIA   . Morbid obesity   . BIPOLAR DISORDER UNSPECIFIED     depression  . Chronic diastolic heart failure   . ALLERGIC RHINITIS   . APHTHOUS STOMATITIS   . TRANSAMINASES, SERUM, ELEVATED   . ASYMPTOMATIC POSTMENOPAUSAL STATUS   . FIBROMYALGIA   . GERD (gastroesophageal reflux disease)   . History of wrist fracture     rt wrist  . Depression   . PONV (postoperative nausea and vomiting)   . Type II diabetes mellitus   . Neuropathy due to secondary diabetes   . Iron deficiency anemia   . Kidney stones     sees urologist @ [facility]  . External bleeding hemorrhoids   . Rheumatoid arthritis(714.0)     knees and shoulders  . Fibromyalgia   . History of gout     "haven't had it in several years" (02/08/2012)  . PTSD (post-traumatic stress disorder)   . Shortness of breath     Past Surgical History  Procedure Laterality Date  . Cholecystectomy  1985  . Abdominal hysterectomy  1976  . Tubal ligation  1972  . Right nasal surgery  08/1988  . Right sinus removed  08/1989    tooth partial  . Percitania stone removed (l) kidney  1992  . Lithotripsy (l) kidney  1997  . Left cystoscopy   1990  . Thyroidectomy  04/22/2011    Procedure: THYROIDECTOMY;  Surgeon: Antony Contras, MD;  Location: [facility] OR;  Service: ENT;  Laterality: N/A;  TOTAL THYROIDECOTMY  . Cataract extraction, bilateral  02/2011    epps  . Total knee arthroplasty   06/18/2011    Procedure: TOTAL KNEE ARTHROPLASTY;  Surgeon: Nestor Lewandowsky, MD;  Location: [facility] OR;  Service: Orthopedics;  Laterality: Left;  DEPUY  . Total knee arthroplasty  02/07/2012    Procedure: TOTAL KNEE ARTHROPLASTY;  Surgeon: Nestor Lewandowsky, MD;  Location: [facility] OR;  Service: Orthopedics;  Laterality: Right;  . Incisional breast biopsy  2000    right  . Cardiac catheterization  2001    sees Dr Peter [place]il[place]  . Knee arthroscopy  08/2009    right  . Knee arthroscopy with medial menisectomy      left  . Excisional hemorrhoidectomy      "dr cut out in his office" (02/08/2012)  . Left oophorectomy  1980  . Rotator cuff repair  2013    right shoulder x 2  . Joint replacement      rt knee  . Partial mastectomy with needle localization Left 09/01/2012    Procedure: PARTIAL MASTECTOMY WITH NEEDLE LOCALIZATION;  Surgeon: Ernestene Mention, MD;  Location: [facility] OR;  Service: General;  Laterality: Left;  . Cataract extraction  2014    History   Social History  . Marital Status: Married    Spouse Name: N/A    Number of Children:  2  . Years of Education: N/A   Occupational History  . disability    Social History Main Topics  . Smoking status: Former Smoker -- 1.00 packs/day for 6 years    Types: Cigarettes    Quit date: 04/06/1983  . Smokeless tobacco: Never Used     Comment: Married, lives with spouse. Disable- 2 grown kids-6 g-kids  . Alcohol Use: No     Comment: none  . Drug Use: No  . Sexually Active: Not Currently   Other Topics Concern  . Not on file   Social History Narrative   Married, lives with spouse - 2 adult children   diabled    Current Outpatient Prescriptions on File Prior to Visit  Medication Sig Dispense Refill  . Alpha-Lipoic Acid 300 MG TABS Take 300 mg by mouth daily.       . ARIPiprazole (ABILIFY) 20 MG tablet Take 20 mg by mouth at bedtime.       Marland Kitchen aspirin 81 MG tablet Take 81 mg by mouth daily.      Marland Kitchen atorvastatin (LIPITOR) 20 MG tablet Take 1 tablet (20  mg total) by mouth daily.  90 tablet  3  . BIOTIN PO Take 1 tablet by mouth 3 (three) times daily.      . Cholecalciferol (VITAMIN D3) 400 UNITS CAPS Take 400 Units by mouth daily.       . clonazePAM (KLONOPIN) 1 MG tablet Take 2 mg by mouth at bedtime.       . Coenzyme Q10 120 MG CAPS Take 120 mg by mouth daily.      Marland Kitchen estrogens, conjugated, (PREMARIN) 0.3 MG tablet Take 0.3 mg by mouth daily.      . furosemide (LASIX) 40 MG tablet Take 40 mg by mouth daily.      . hydrochlorothiazide 25 MG tablet Take 25 mg by mouth every morning.       . Lactobacillus (ULTIMATE PROBIOTIC FORMULA PO) Take 1 capsule by mouth every evening.       . lamoTRIgine (LAMICTAL) 200 MG tablet Take 200 mg by mouth daily at 12 noon.       . Magnesium 200 MG TABS Take 200 mg by mouth daily.      . Manganese Gluconate 50 MG TABS Take 50 mg by mouth every evening.       . meloxicam (MOBIC) 15 MG tablet Take 15 mg by mouth daily.       . Multiple Vitamin (MULTIVITAMIN WITH MINERALS) TABS Take 1 tablet by mouth every evening.      . neomycin-bacitracin-polymyxin (NEOSPORIN) ointment Apply 1 application topically every evening.      Marland Kitchen omeprazole (PRILOSEC) 20 MG capsule Take 20 mg by mouth 2 (two) times daily.      Marland Kitchen oxybutynin (DITROPAN-XL) 10 MG 24 hr tablet Take 10 mg by mouth every evening.       . potassium citrate (UROCIT-K 10) 10 MEQ (1080 MG) SR tablet Take 20 mEq by mouth 2 (two) times daily.       Marland Kitchen PREMARIN 0.3 MG tablet TAKE 1 TABLET DAILY  90 tablet  1  . QUEtiapine Fumarate (SEROQUEL XR) 150 MG TB24 Take 150-450 mg by mouth 2 (two) times daily. Take 1 every morning and 3  At bedtime      . SYNTHROID 50 MCG tablet TAKE 1 TABLET DAILY  90 tablet  1  . Thiamine HCl (VITAMIN B-1) 100 MG tablet Take 100 mg by mouth every  evening.       . TraMADol HCl 100 MG CP24 Take 1 tablet by mouth daily.  30 capsule  2  . venlafaxine (EFFEXOR-XR) 150 MG 24 hr capsule Take 150 mg by mouth every morning.         No current  facility-administered medications on file prior to visit.    No Known Allergies  Family History  Problem Relation Age of Onset  . Hypertension Mother   . Diabetes Father   . Hypertension Father   . Hyperlipidemia Father   . Heart attack Other   . Coronary artery disease Other    BP 126/82  Pulse 100  Ht 5\' 3"  (1.6 m)  Wt 261 lb (118.389 kg)  BMI 46.25 kg/m2  SpO2 93%  Review of Systems She has weight gain.  She denies hypoglycemia    Objective:   Physical Exam VITAL SIGNS:  See vs page GENERAL: no distress  Lab Results  Component Value Date   HGBA1C 8.4* 09/27/2012      Assessment & Plan:  DM: This insulin regimen was chosen from multiple options, for its simplicity and relatively low cost.  The benefits of glycemic control must be weighed against the risks of hypoglycemia.  She needs increased rx

## 2012-10-03 DIAGNOSIS — G4730 Sleep apnea, unspecified: Secondary | ICD-10-CM

## 2012-10-03 HISTORY — DX: Sleep apnea, unspecified: G47.30

## 2012-10-11 ENCOUNTER — Ambulatory Visit (HOSPITAL_BASED_OUTPATIENT_CLINIC_OR_DEPARTMENT_OTHER): Payer: Medicare Other | Attending: Neurology | Admitting: Radiology

## 2012-10-11 VITALS — Ht 63.0 in | Wt 255.0 lb

## 2012-10-11 DIAGNOSIS — G4730 Sleep apnea, unspecified: Secondary | ICD-10-CM

## 2012-10-11 DIAGNOSIS — G43119 Migraine with aura, intractable, without status migrainosus: Secondary | ICD-10-CM

## 2012-10-11 DIAGNOSIS — G43819 Other migraine, intractable, without status migrainosus: Secondary | ICD-10-CM

## 2012-10-11 DIAGNOSIS — G4733 Obstructive sleep apnea (adult) (pediatric): Secondary | ICD-10-CM | POA: Insufficient documentation

## 2012-10-23 ENCOUNTER — Telehealth: Payer: Self-pay | Admitting: *Deleted

## 2012-10-23 NOTE — Telephone Encounter (Signed)
Received order from [facility] for Diabetic Supplies for patient, completed demo on paperwork and forwarded to provider for completion to be faxed/SLS

## 2012-10-25 DIAGNOSIS — G4733 Obstructive sleep apnea (adult) (pediatric): Secondary | ICD-10-CM

## 2012-10-25 NOTE — Procedures (Signed)
[name], Brittany Acosta                 ACCOUNT NO.:  [id number]  MEDICAL RECORD NO.:  [id number]          PATIENT TYPE:  OUT  LOCATION:  SLEEP CENTER                 FACILITY:  [facility]  PHYSICIAN:  Barbaraann Share, MD,FCCPDATE OF BIRTH:  1950/05/25  DATE OF STUDY:  10/11/2012                           NOCTURNAL POLYSOMNOGRAM  REFERRING PHYSICIAN:  MICHAEL L. SOO  INDICATION FOR STUDY:  Hypersomnia with sleep apnea.  EPWORTH SLEEPINESS SCORE:  8.  MEDICATIONS:  SLEEP ARCHITECTURE:  The patient had a total sleep time of 371 minutes with no slow-wave sleep and only 55 minutes of REM.  Sleep onset latency was normal at 12 minutes and REM onset was prolonged at 2157 minutes. Sleep efficiency was mildly reduced at 89%.  RESPIRATORY DATA:  The patient was found to have 21 obstructive apneas and 59 obstructive hypopneas, giving her an apnea-hypopnea index of 13 events per hour.  The events occurred in all body positions, and there was moderate snoring noted throughout.  OXYGEN DATA:  There was O2 desaturation as low as 87% with the patient's obstructive events.  CARDIAC DATA:  No clinically significant arrhythmias were noted.  MOVEMENT-PARASOMNIA:  The patient had small numbers of periodic limb movements without significant sleep disruption.  IMPRESSIONS-RECOMMENDATIONS:  Mild obstructive sleep apnea/hypopnea syndrome, with an AHI of 13 events per hour and oxygen desaturation as low as 87% transiently.  Treatment for this degree of sleep apnea can include a trial of weight loss alone, upper airway surgery, dental appliance, and also CPAP.  Clinical correlation is suggested.     Barbaraann Share, MD,FCCP Diplomate, American Board of Sleep Medicine   [facility]  D:  10/25/2012 07:52:37  T:  10/25/2012 08:32:35  Job:  [id number]

## 2012-10-26 ENCOUNTER — Telehealth: Payer: Self-pay | Admitting: Neurology

## 2012-10-26 NOTE — Telephone Encounter (Signed)
Called and spoke with the patient. I let her know that I had received a preliminary copy of her sleep study from the Sleep Center. We discussed the preliminary findings and at Dr. Moises Blood recommendation, I offered to refer her to a sleep specialist. The patient denied a referral at this point. I asked her to call if she changed her mind. She states she will. I also gave her the phone number of the sleep center as she wanted a copy of her study mailed to her.

## 2012-12-06 ENCOUNTER — Other Ambulatory Visit (INDEPENDENT_AMBULATORY_CARE_PROVIDER_SITE_OTHER): Payer: Medicare Other

## 2012-12-06 ENCOUNTER — Ambulatory Visit (INDEPENDENT_AMBULATORY_CARE_PROVIDER_SITE_OTHER): Payer: Medicare Other | Admitting: Internal Medicine

## 2012-12-06 ENCOUNTER — Other Ambulatory Visit: Payer: Self-pay | Admitting: Internal Medicine

## 2012-12-06 ENCOUNTER — Encounter: Payer: Self-pay | Admitting: Internal Medicine

## 2012-12-06 VITALS — BP 132/70 | HR 86 | Temp 97.4°F | Wt 260.8 lb

## 2012-12-06 DIAGNOSIS — E1049 Type 1 diabetes mellitus with other diabetic neurological complication: Secondary | ICD-10-CM

## 2012-12-06 DIAGNOSIS — R109 Unspecified abdominal pain: Secondary | ICD-10-CM

## 2012-12-06 DIAGNOSIS — E1065 Type 1 diabetes mellitus with hyperglycemia: Secondary | ICD-10-CM

## 2012-12-06 DIAGNOSIS — H9313 Tinnitus, bilateral: Secondary | ICD-10-CM

## 2012-12-06 DIAGNOSIS — Z23 Encounter for immunization: Secondary | ICD-10-CM

## 2012-12-06 DIAGNOSIS — Z87442 Personal history of urinary calculi: Secondary | ICD-10-CM

## 2012-12-06 DIAGNOSIS — H9319 Tinnitus, unspecified ear: Secondary | ICD-10-CM

## 2012-12-06 LAB — BASIC METABOLIC PANEL
BUN: 13 mg/dL (ref 6–23)
CO2: 30 mEq/L (ref 19–32)
Calcium: 8.9 mg/dL (ref 8.4–10.5)
Chloride: 95 mEq/L — ABNORMAL LOW (ref 96–112)
Creatinine, Ser: 0.8 mg/dL (ref 0.4–1.2)
GFR: 73.96 mL/min (ref >60.00)
Glucose, Bld: 139 mg/dL — ABNORMAL HIGH (ref 70–99)
Potassium: 3.6 mEq/L (ref 3.5–5.1)
Sodium: 134 mEq/L — ABNORMAL LOW (ref 135–145)

## 2012-12-06 LAB — MICROALBUMIN / CREATININE URINE RATIO
Creatinine,U: 60.6 mg/dL
Microalb Creat Ratio: 0.7 mg/g (ref 0.0–30.0)
Microalb, Ur: 0.4 mg/dL (ref 0.0–1.9)

## 2012-12-06 LAB — URINALYSIS, ROUTINE W REFLEX MICROSCOPIC
Bilirubin Urine: NEGATIVE
Hgb urine dipstick: NEGATIVE
Ketones, ur: NEGATIVE
Leukocytes, UA: NEGATIVE
Nitrite: NEGATIVE
Specific Gravity, Urine: 1.01 (ref 1.000–1.030)
Total Protein, Urine: NEGATIVE
Urine Glucose: 500
Urobilinogen, UA: 0.2 (ref 0.0–1.0)
WBC, UA: NONE SEEN (ref 0–?)
pH: 7.5 (ref 5.0–8.0)

## 2012-12-06 LAB — LIPID PANEL
Cholesterol: 116 mg/dL (ref 0–200)
HDL: 53.2 mg/dL (ref >39.00)
LDL Cholesterol: 37 mg/dL (ref 0–99)
Total CHOL/HDL Ratio: 2
Triglycerides: 130 mg/dL (ref 0.0–149.0)
VLDL: 26 mg/dL (ref 0.0–40.0)

## 2012-12-06 NOTE — Assessment & Plan Note (Signed)
Follows with podiatry, optho and endo for same -  on gabapentin since 12/2010 in addition to elavil  - continue same On ASA and statin meds managed by endo - 70/30 only, metformin stopped 12/2011 Check a1c q3-36mo, microalb, lipids and titrate tx as needed  Lab Results  Component Value Date   HGBA1C 8.4* 09/27/2012

## 2012-12-06 NOTE — Progress Notes (Signed)
Subjective:    Patient ID: Brittany Acosta, female    DOB: 25-Apr-1950, 62 y.o.   MRN: [id number]  Flank Pain This is a recurrent problem. The current episode started in the past 7 days. The problem occurs intermittently. The problem has been waxing and waning since onset. The pain is present in the lumbar spine. The quality of the pain is described as aching and stabbing. The pain does not radiate. The pain is moderate. The pain is the same all the time. Stiffness is present all day. Associated symptoms include dysuria. Pertinent negatives include no abdominal pain, bladder incontinence, chest pain, fever, numbness, pelvic pain, tingling, weakness or weight loss. Risk factors include obesity and menopause (history of kidney stone 1997; no trauma). She has tried bed rest for the symptoms. The treatment provided no relief.   Also "crickets" noise in both ears for past 2 months, day and night  also reviewed chronic medical issues  Past Medical History  Diagnosis Date  . GOITER, MULTINODULAR   . HYPOTHYROIDISM   . HYPERCHOLESTEROLEMIA   . Morbid obesity   . BIPOLAR DISORDER UNSPECIFIED     depression  . Chronic diastolic heart failure   . ALLERGIC RHINITIS   . APHTHOUS STOMATITIS   . TRANSAMINASES, SERUM, ELEVATED   . ASYMPTOMATIC POSTMENOPAUSAL STATUS   . FIBROMYALGIA   . GERD (gastroesophageal reflux disease)   . History of wrist fracture     rt wrist  . Depression   . PONV (postoperative nausea and vomiting)   . Type II diabetes mellitus   . Neuropathy due to secondary diabetes   . Iron deficiency anemia   . Kidney stones     sees urologist @ [facility]  . External bleeding hemorrhoids   . Rheumatoid arthritis(714.0)     knees and shoulders  . Fibromyalgia   . History of gout     "haven't had it in several years" (02/08/2012)  . PTSD (post-traumatic stress disorder)   . Shortness of breath     Review of Systems  Constitutional: Negative for fever, weight loss and fatigue.   Respiratory: Negative for cough and shortness of breath.   Cardiovascular: Negative for chest pain and palpitations.  Gastrointestinal: Negative for abdominal pain.  Genitourinary: Positive for dysuria and flank pain. Negative for bladder incontinence and pelvic pain.  Neurological: Negative for tingling, weakness and numbness.       Objective:   Physical Exam BP 132/70  Pulse 86  Temp(Src) 97.4 F (36.3 C) (Oral)  Wt 260 lb 12.8 oz (118.298 kg)  BMI 46.21 kg/m2  SpO2 96% Wt Readings from Last 3 Encounters:  12/06/12 260 lb 12.8 oz (118.298 kg)  10/11/12 255 lb (115.667 kg)  09/27/12 261 lb (118.389 kg)   Constitutional: She is obese, but appears well-developed and well-nourished. No distress.  HENT: NCAT, TMs clear B without erythema or effusion, no cerumen impaction - sinus nontender to palp Neck: Normal range of motion. Neck supple. No JVD present. No thyromegaly present.  Cardiovascular: Normal rate, regular rhythm and normal heart sounds.  No murmur heard. No BLE edema. Pulmonary/Chest: Effort normal and breath sounds normal. No respiratory distress. She has no wheezes.  Abdomen: SNTND, +BS, no flank pain to palp Musculoskeletal: Normal range of motion, no joint effusions. No gross deformities Neurological: She is alert and oriented to person, place, and time. No cranial nerve deficit. Coordination normal.  Skin: Skin is warm and dry. No rash noted. No erythema.  Psychiatric: She has a  normal mood and affect. Her behavior is normal. Judgment and thought content normal.   Lab Results  Component Value Date   WBC 10.6* 08/31/2012   HGB 12.8 08/31/2012   HCT 38.0 08/31/2012   PLT 337 08/31/2012   GLUCOSE 194* 08/31/2012   CHOL 128 06/11/2011   TRIG 228.0* 06/11/2011   HDL 50.00 06/11/2011   LDLDIRECT 56.4 06/11/2011   LDLCALC 42 09/03/2008   ALT 22 01/21/2012   AST 23 01/21/2012   NA 135 08/31/2012   K 3.2* 08/31/2012   CL 92* 08/31/2012   CREATININE 0.72 08/31/2012   BUN 13  08/31/2012   CO2 30 08/31/2012   TSH 7.09* 12/09/2011   INR 1.01 02/04/2012   HGBA1C 8.4* 09/27/2012   MICROALBUR 0.2 12/24/2008       Assessment & Plan:  See problem list. Medications and labs reviewed today.  B tinnitus - ENT exam appears benign, MRI brain earlier this year 07/2012 ok - refer for audiology testing  R flank pain - exam benign but hx kidney stone requiring intervention in 1997 - check UA, reports 06/2012 KUB with uro at [facility] without stones - on NSAIDs as for osteoarthritis

## 2012-12-06 NOTE — Patient Instructions (Signed)
It was good to see you today. We have reviewed your prior records including labs and tests today Test(s) ordered today. Your results will be released to MyChart (or called to you) after review, usually within 72hours after test completion. If any changes need to be made, you will be notified at that same time. we'll make referral for hearing testing. Our office will contact you regarding appointment(s) once made. Further testing for flank pain and possible kidney stones will depend on what labs show -  Continue hydration as ongoing and call if worse or unimproved

## 2012-12-07 ENCOUNTER — Telehealth: Payer: Self-pay | Admitting: Internal Medicine

## 2012-12-07 MED ORDER — TRAMADOL HCL (ER BIPHASIC) 100 MG PO CP24: 1.0000 | ORAL_CAPSULE | Freq: Every day | ORAL | Status: DC

## 2012-12-07 NOTE — Addendum Note (Signed)
Addended by: Rene Paci A on: 12/07/2012 10:58 AM   Modules accepted: Orders

## 2012-12-07 NOTE — Telephone Encounter (Signed)
[facility]ed [facility] to [facility]...lmb 

## 2012-12-07 NOTE — Telephone Encounter (Signed)
Tramadol [facility]...lmb

## 2012-12-07 NOTE — Telephone Encounter (Signed)
Patient Information:  Caller Name: Sahalie  Phone: [phone]  Patient: Brittany Acosta, Brittany Acosta  Gender: Female  DOB: Dec 15, 1950  Age: 62 Years  PCP: Rene Paci (Adults only)  Office Follow Up:  Does the office need to follow up with this patient?: No  Instructions For The Office: N/A  RN Note:  pt states her daughter is coming from [facility] to take care of her  Symptoms  Reason For Call & Symptoms: pt states that she had a flu shot on 12/06/12.  Pt reports she began having diarrhea yesterday and it continues.  Pt states she has had at least 25 episodes of diarrhea.  Pt wanted to know if it was a reaction to the flu shot. Pt reports that she is drinking fluids.   Reviewed Health History In EMR: Yes  Reviewed Medications In EMR: Yes  Reviewed Allergies In EMR: Yes  Reviewed Surgeries / Procedures: Yes  Date of Onset of Symptoms: 12/06/2012  Guideline(s) Used:  Immunization Reactions  Diarrhea  Disposition Per Guideline:   Go to Office Now  Reason For Disposition Reached:   Age > 60 years and has had > 6 diarrhea stools in past 24 hours  Advice Given:  Fluids:  Drink more fluids, at least 8-10 glasses (8 oz or 240 ml) daily.  For example: sports drinks, diluted fruit juices, soft drinks.  Diarrhea Medication  - Imodium AD:   Helps reduce diarrhea.  Adult dosage: 4 mg (2 capsules or 4 teaspoons or 20 ml) is the recommended first dose. You may take an additional 2 mg (1 capsule or 2 teaspoons or 10 ml) after each loose BM.  Maximum dosage: 16 mg (8 capsules or 16 teaspoons or 80 ml).  1 capsule = 2 mg; 1 teaspoon (5 ml) = 1 mg.  Caution: Do not use if you have a fever greater than 100F (37.8C). Do not use if there is blood or mucus in your stools. Do not use for more than 2 days.  Read all package instructions.  Call Back If:  Signs of dehydration occur (e.g., no urine for more than 12 hours, very dry mouth, lightheaded, etc.)  You become worse.  Patient Refused  Recommendation:  Patient Refused Care Advice  pt states she cannot come into the office because she feels to bad.  RN advised pt that was the reason to come in. Pt states she will continue drinking fluids and then talk with her daughter when she gets there.

## 2012-12-07 NOTE — Telephone Encounter (Signed)
Pt called stated Dr. Felicity Coyer assistant called about the lab result that she done 12/06/12. Please call pt. Pt is aware that Valentina Gu is not going to be here Friday.

## 2012-12-08 NOTE — Telephone Encounter (Signed)
Spoke with pt advised all labs normal, no treatment changes recommended

## 2012-12-12 ENCOUNTER — Ambulatory Visit (INDEPENDENT_AMBULATORY_CARE_PROVIDER_SITE_OTHER): Payer: Medicare Other | Admitting: Internal Medicine

## 2012-12-12 ENCOUNTER — Encounter: Payer: Self-pay | Admitting: Internal Medicine

## 2012-12-12 ENCOUNTER — Ambulatory Visit (INDEPENDENT_AMBULATORY_CARE_PROVIDER_SITE_OTHER)
Admission: RE | Admit: 2012-12-12 | Discharge: 2012-12-12 | Disposition: A | Discharge status: Home / Self Care | Payer: Medicare Other | Source: Ambulatory Visit | Attending: Internal Medicine | Admitting: Internal Medicine

## 2012-12-12 ENCOUNTER — Other Ambulatory Visit (INDEPENDENT_AMBULATORY_CARE_PROVIDER_SITE_OTHER): Payer: Medicare Other

## 2012-12-12 VITALS — BP 140/72 | HR 85 | Temp 97.1°F | Wt 257.0 lb

## 2012-12-12 DIAGNOSIS — R109 Unspecified abdominal pain: Secondary | ICD-10-CM

## 2012-12-12 DIAGNOSIS — T50Z95A Adverse effect of other vaccines and biological substances, initial encounter: Secondary | ICD-10-CM

## 2012-12-12 DIAGNOSIS — E039 Hypothyroidism, unspecified: Secondary | ICD-10-CM

## 2012-12-12 DIAGNOSIS — IMO0001 Reserved for inherently not codable concepts without codable children: Secondary | ICD-10-CM

## 2012-12-12 DIAGNOSIS — T8062XA Other serum reaction due to vaccination, initial encounter: Secondary | ICD-10-CM

## 2012-12-12 LAB — CBC WITH DIFFERENTIAL/PLATELET
Basophils Absolute: 0.1 10*3/uL (ref 0.0–0.1)
Basophils Relative: 0.7 % (ref 0.0–3.0)
Eosinophils Absolute: 0.2 10*3/uL (ref 0.0–0.7)
Eosinophils Relative: 3 % (ref 0.0–5.0)
HCT: 36.6 % (ref 36.0–46.0)
Hemoglobin: 12.2 g/dL (ref 12.0–15.0)
Lymphocytes Relative: 39.3 % (ref 12.0–46.0)
Lymphs Abs: 3 10*3/uL (ref 0.7–4.0)
MCHC: 33.4 g/dL (ref 30.0–36.0)
MCV: 80.9 fl (ref 78.0–100.0)
Monocytes Absolute: 0.6 10*3/uL (ref 0.1–1.0)
Monocytes Relative: 7.3 % (ref 3.0–12.0)
Neutro Abs: 3.8 10*3/uL (ref 1.4–7.7)
Neutrophils Relative %: 49.7 % (ref 43.0–77.0)
Platelets: 377 10*3/uL (ref 150.0–400.0)
RBC: 4.52 Mil/uL (ref 3.87–5.11)
RDW: 14.5 % (ref 11.5–14.6)
WBC: 7.7 10*3/uL (ref 4.5–10.5)

## 2012-12-12 LAB — HEPATIC FUNCTION PANEL
ALT: 34 U/L (ref 0–35)
AST: 32 U/L (ref 0–37)
Albumin: 3.5 g/dL (ref 3.5–5.2)
Alkaline Phosphatase: 100 U/L (ref 39–117)
Bilirubin, Direct: 0.1 mg/dL (ref 0.0–0.3)
Total Bilirubin: 0.5 mg/dL (ref 0.3–1.2)
Total Protein: 6.7 g/dL (ref 6.0–8.3)

## 2012-12-12 LAB — TSH: TSH: 2.93 u[IU]/mL (ref 0.35–5.50)

## 2012-12-12 NOTE — Patient Instructions (Signed)
It was good to see you today. We have reviewed your prior records including labs and tests today Test(s) ordered today. Your results will be released to MyChart (or called to you) after review, usually within 72hours after test completion. If any changes need to be made, you will be notified at that same time. we'll make referral for CT scan to look for kidney stone. Our office will contact you regarding appointment(s) once made. Medications reviewed and updated, no changes recommended at this time. Please schedule followup in 4 months, call sooner if problems.

## 2012-12-12 NOTE — Progress Notes (Signed)
Subjective:    Patient ID: Brittany Acosta, female    DOB: Apr 26, 1950, 62 y.o.   MRN: [id number]  HPI See CC Chief Complaint  Patient presents with  . Follow-up    Pt states after recieving flu shot she became sick chills, fatigue, muscle aches, diarrhea   Also continue R flank pain without change from last OV also reviewed chronic medical issues  Past Medical History  Diagnosis Date  . GOITER, MULTINODULAR   . HYPOTHYROIDISM   . HYPERCHOLESTEROLEMIA   . Morbid obesity   . BIPOLAR DISORDER UNSPECIFIED     depression  . Chronic diastolic heart failure   . ALLERGIC RHINITIS   . APHTHOUS STOMATITIS   . TRANSAMINASES, SERUM, ELEVATED   . ASYMPTOMATIC POSTMENOPAUSAL STATUS   . FIBROMYALGIA   . GERD (gastroesophageal reflux disease)   . History of wrist fracture     rt wrist  . Depression   . PONV (postoperative nausea and vomiting)   . Type II diabetes mellitus   . Neuropathy due to secondary diabetes   . Iron deficiency anemia   . Kidney stones     sees urologist @ [facility]  . External bleeding hemorrhoids   . Rheumatoid arthritis(714.0)     knees and shoulders  . Fibromyalgia   . History of gout     "haven't had it in several years" (02/08/2012)  . PTSD (post-traumatic stress disorder)   . Shortness of breath     Review of Systems  Constitutional: Positive for chills and fatigue.  Respiratory: Negative for cough and shortness of breath.   Cardiovascular: Negative for palpitations.  Gastrointestinal: Positive for diarrhea (resolved with immodium yesterday). Negative for nausea, vomiting and blood in stool.  Musculoskeletal: Positive for myalgias.  Skin: Negative for rash.  Neurological: Positive for weakness (generalized), light-headedness and headaches. Negative for dizziness and facial asymmetry.       Objective:   Physical Exam BP 140/72  Pulse 85  Temp(Src) 97.1 F (36.2 C) (Oral)  Wt 257 lb (116.574 kg)  BMI 45.54 kg/m2  SpO2 98% Wt Readings from Last 3  Encounters:  12/12/12 257 lb (116.574 kg)  12/06/12 260 lb 12.8 oz (118.298 kg)  10/11/12 255 lb (115.667 kg)   Constitutional: She is obese, but appears well-developed and well-nourished. No distress.  HENT: NCAT, TMs clear B without erythema or effusion, no cerumen impaction - sinus nontender to palp Neck: Normal range of motion. Neck supple. No JVD present. No thyromegaly present.  Cardiovascular: Normal rate, regular rhythm and normal heart sounds.  No murmur heard. No BLE edema. Pulmonary/Chest: Effort normal and breath sounds normal. No respiratory distress. She has no wheezes.  Abdomen: SNTND, +BS, no flank pain to palpation Musculoskeletal: Normal range of motion, no joint effusions. No gross deformities Neurological: She is alert and oriented to person, place, and time. No cranial nerve deficit. Coordination normal.  Skin: Skin is warm and dry. No rash noted. No erythema.  Psychiatric: She has a normal mood and affect. Her behavior is normal. Judgment and thought content normal.   Lab Results  Component Value Date   WBC 10.6* 08/31/2012   HGB 12.8 08/31/2012   HCT 38.0 08/31/2012   PLT 337 08/31/2012   GLUCOSE 139* 12/06/2012   CHOL 116 12/06/2012   TRIG 130.0 12/06/2012   HDL 53.20 12/06/2012   LDLDIRECT 56.4 06/11/2011   LDLCALC 37 12/06/2012   ALT 22 01/21/2012   AST 23 01/21/2012   NA 134* 12/06/2012  K 3.6 12/06/2012   CL 95* 12/06/2012   CREATININE 0.8 12/06/2012   BUN 13 12/06/2012   CO2 30 12/06/2012   TSH 7.09* 12/09/2011   INR 1.01 02/04/2012   HGBA1C 8.4* 09/27/2012   MICROALBUR 0.4 12/06/2012       Assessment & Plan:  See problem list. Medications and labs reviewed today.  Myalgias, flu-like reaction to flu vaccine last week? - symptoms now resolved. Suspect viral infection overlap given transient diarrhea? -reassurance and education provided. Okay to continue Tylenol and Imodium as needed  R flank pain, ongoing >2 weeks - exam benign but hx kidney stone requiring intervention in  1997 - neg UA last week, reports 06/2012 KUB with uro at [facility] without stones - on NSAIDs as for osteoarthritis, improved but not resolved -  check CBC, LFTs and Ct a/p given overlap with other symptoms

## 2012-12-12 NOTE — Assessment & Plan Note (Signed)
Check TSH now, adjust as needed 04/2011 underwent L thyroid lobectomy due to goiter with mass effect  Lab Results  Component Value Date   TSH 7.09* 12/09/2011

## 2012-12-13 ENCOUNTER — Ambulatory Visit: Payer: Medicare Other | Admitting: Internal Medicine

## 2012-12-19 ENCOUNTER — Other Ambulatory Visit: Payer: Self-pay | Admitting: Internal Medicine

## 2012-12-25 ENCOUNTER — Ambulatory Visit (INDEPENDENT_AMBULATORY_CARE_PROVIDER_SITE_OTHER): Payer: Medicare Other | Admitting: Endocrinology

## 2012-12-25 ENCOUNTER — Telehealth: Payer: Self-pay | Admitting: Internal Medicine

## 2012-12-25 ENCOUNTER — Encounter: Payer: Self-pay | Admitting: Endocrinology

## 2012-12-25 ENCOUNTER — Ambulatory Visit: Payer: Medicare Other | Admitting: Internal Medicine

## 2012-12-25 ENCOUNTER — Encounter: Payer: Self-pay | Admitting: Neurology

## 2012-12-25 ENCOUNTER — Ambulatory Visit (INDEPENDENT_AMBULATORY_CARE_PROVIDER_SITE_OTHER): Payer: Medicare Other | Admitting: Neurology

## 2012-12-25 VITALS — BP 140/74 | HR 88 | Temp 98.1°F | Ht 63.0 in | Wt 251.0 lb

## 2012-12-25 VITALS — BP 140/74 | HR 88 | Ht 63.0 in | Wt 251.0 lb

## 2012-12-25 DIAGNOSIS — E1049 Type 1 diabetes mellitus with other diabetic neurological complication: Secondary | ICD-10-CM

## 2012-12-25 DIAGNOSIS — E1065 Type 1 diabetes mellitus with hyperglycemia: Secondary | ICD-10-CM

## 2012-12-25 DIAGNOSIS — G4733 Obstructive sleep apnea (adult) (pediatric): Secondary | ICD-10-CM

## 2012-12-25 DIAGNOSIS — G43909 Migraine, unspecified, not intractable, without status migrainosus: Secondary | ICD-10-CM

## 2012-12-25 LAB — HEMOGLOBIN A1C: Hgb A1c MFr Bld: 8.3 % — ABNORMAL HIGH (ref 4.6–6.5)

## 2012-12-25 MED ORDER — PROPRANOLOL HCL 40 MG PO TABS: 40.0000 mg | ORAL_TABLET | Freq: Two times a day (BID) | ORAL | Status: DC

## 2012-12-25 NOTE — Patient Instructions (Addendum)
A diabetes blood test is requested for you today.  We'll contact you with results.  Please come back for a follow-up appointment in 3 months.   on this type of insulin, you should eat lunch no later than 4 hrs after breakfast.  Otherwise, your blood sugar could go low.   check your blood sugar twice a day.  vary the time of day when you check, between before the 3 meals, and at bedtime.  also check if you have symptoms of your blood sugar being too high or too low.  please keep a record of the readings and bring it to your next appointment here.  please call us sooner if your blood sugar goes below 70, or if it stays over 200.

## 2012-12-25 NOTE — Progress Notes (Signed)
Subjective:    Patient ID: Brittany Acosta, female    DOB: 22-Nov-1950, 62 y.o.   MRN: [id number]  HPI Pt returns for f/u of insulin-requiring DM (dx'ed 2002, complicated by peripheral sensory neuropathy; therapy limited by pt's need for a simple, inexpensive regimen; she has never had severe hypoglycemia or DKA).  she brings a record of her cbg's which i have reviewed today.  It varies from 90-200.  There is no trend throughout the day.  pt states she feels well in general. Past Medical History  Diagnosis Date  . GOITER, MULTINODULAR   . HYPOTHYROIDISM   . HYPERCHOLESTEROLEMIA   . Morbid obesity   . BIPOLAR DISORDER UNSPECIFIED     depression  . Chronic diastolic heart failure   . ALLERGIC RHINITIS   . FIBROMYALGIA   . GERD (gastroesophageal reflux disease)   . History of wrist fracture     rt wrist  . Depression   . Type II diabetes mellitus   . Neuropathy due to secondary diabetes   . Iron deficiency anemia   . Kidney stones     sees urologist @ [facility]  . History of gout     "haven't had it in several years" (02/08/2012)  . PTSD (post-traumatic stress disorder)     Past Surgical History  Procedure Laterality Date  . Cholecystectomy  1985  . Abdominal hysterectomy  1976  . Tubal ligation  1972  . Right nasal surgery  08/1988  . Right sinus removed  08/1989    tooth partial  . Percitania stone removed (l) kidney  1992  . Lithotripsy (l) kidney  1997  . Left cystoscopy   1990  . Thyroidectomy  04/22/2011    Procedure: THYROIDECTOMY;  Surgeon: Antony Contras, MD;  Location: [facility] OR;  Service: ENT;  Laterality: N/A;  TOTAL THYROIDECOTMY  . Cataract extraction, bilateral  02/2011    epps  . Total knee arthroplasty  06/18/2011    Procedure: TOTAL KNEE ARTHROPLASTY;  Surgeon: Nestor Lewandowsky, MD;  Location: [facility] OR;  Service: Orthopedics;  Laterality: Left;  DEPUY  . Total knee arthroplasty  02/07/2012    Procedure: TOTAL KNEE ARTHROPLASTY;  Surgeon: Nestor Lewandowsky, MD;  Location: [facility] OR;   Service: Orthopedics;  Laterality: Right;  . Incisional breast biopsy  2000    right  . Cardiac catheterization  2001    sees Dr Peter [place]  . Knee arthroscopy  08/2009    right  . Knee arthroscopy with medial menisectomy      left  . Excisional hemorrhoidectomy      "dr cut out in his office" (02/08/2012)  . Left oophorectomy  1980  . Rotator cuff repair  2013    right shoulder x 2  . Joint replacement      rt knee  . Partial mastectomy with needle localization Left 09/01/2012    Procedure: PARTIAL MASTECTOMY WITH NEEDLE LOCALIZATION;  Surgeon: Ernestene Mention, MD;  Location: [facility] OR;  Service: General;  Laterality: Left;  . Cataract extraction  2014    History   Social History  . Marital Status: Married    Spouse Name: N/A    Number of Children: 2  . Years of Education: N/A   Occupational History  . disability    Social History Main Topics  . Smoking status: Former Smoker -- 1.00 packs/day for 6 years    Types: Cigarettes    Quit date: 04/06/1983  . Smokeless tobacco: Never Used  Comment: Married, lives with spouse. Disable- 2 grown kids-6 g-kids  . Alcohol Use: No     Comment: none  . Drug Use: No  . Sexual Activity: Not Currently   Other Topics Concern  . Not on file   Social History Narrative   Married, lives with spouse - 2 adult children   diabled    Current Outpatient Prescriptions on File Prior to Visit  Medication Sig Dispense Refill  . Alpha-Lipoic Acid 300 MG TABS Take 300 mg by mouth daily.       . ARIPiprazole (ABILIFY) 20 MG tablet Take 20 mg by mouth at bedtime.       Marland Kitchen aspirin 81 MG tablet Take 81 mg by mouth daily.      Marland Kitchen atorvastatin (LIPITOR) 20 MG tablet Take 1 tablet (20 mg total) by mouth daily.  90 tablet  3  . Cholecalciferol (VITAMIN D3) 400 UNITS CAPS Take 400 Units by mouth daily.       . clonazePAM (KLONOPIN) 1 MG tablet Take 2 mg by mouth at bedtime.       . Coenzyme Q10 120 MG CAPS Take 120 mg by mouth daily.      .  furosemide (LASIX) 40 MG tablet Take 40 mg by mouth daily.      . hydrochlorothiazide 25 MG tablet Take 25 mg by mouth every morning.       . insulin NPH (HUMULIN N,NOVOLIN N) 100 UNIT/ML injection Inject 40 Units into the skin 2 (two) times daily.       . Lactobacillus (ULTIMATE PROBIOTIC FORMULA PO) Take 1 capsule by mouth every evening.       . lamoTRIgine (LAMICTAL) 200 MG tablet Take 200 mg by mouth daily at 12 noon.       . Magnesium 200 MG TABS Take 200 mg by mouth daily.      . Manganese Gluconate 50 MG TABS Take 50 mg by mouth every evening.       . meloxicam (MOBIC) 15 MG tablet Take 15 mg by mouth daily.       . Multiple Vitamin (MULTIVITAMIN WITH MINERALS) TABS Take 1 tablet by mouth every evening.      Marland Kitchen omeprazole (PRILOSEC) 20 MG capsule TAKE 1 CAPSULE TWICE DAILY  180 capsule  1  . oxybutynin (DITROPAN-XL) 10 MG 24 hr tablet Take 10 mg by mouth every evening.       . potassium citrate (UROCIT-K 10) 10 MEQ (1080 MG) SR tablet Take 20 mEq by mouth 2 (two) times daily.       . prazosin (MINIPRESS) 5 MG capsule Take 10 mg by mouth every evening.      Marland Kitchen PREMARIN 0.3 MG tablet TAKE 1 TABLET DAILY  90 tablet  1  . QUEtiapine Fumarate (SEROQUEL XR) 150 MG TB24 Take 150-450 mg by mouth 2 (two) times daily. Take 1 every morning and 2  At bedtime      . SYNTHROID 50 MCG tablet TAKE 1 TABLET DAILY  90 tablet  1  . Thiamine HCl (VITAMIN B-1) 100 MG tablet Take 100 mg by mouth every evening.        No current facility-administered medications on file prior to visit.    No Known Allergies  Family History  Problem Relation Age of Onset  . Hypertension Mother   . Diabetes Father   . Hypertension Father   . Hyperlipidemia Father   . Heart attack Other   . Coronary artery disease Other  BP 140/74  Pulse 88  Ht 5\' 3"  (1.6 m)  Wt 251 lb (113.853 kg)  BMI 44.47 kg/m2  SpO2 98%  Review of Systems denies hypoglycemia and weight change    Objective:   Physical Exam VITAL SIGNS:   See vs page GENERAL: no distress  Lab Results  Component Value Date   HGBA1C 8.3* 12/25/2012      Assessment & Plan:  DM: This insulin regimen was chosen from multiple options, for its simplicity.  The benefits of glycemic control must be weighed against the risks of hypoglycemia.   bipolar disorder: this complicates the rx of DM. Fibromyalgia: this limits exercise of DM.

## 2012-12-25 NOTE — Progress Notes (Signed)
NEUROLOGY FOLLOW UP OFFICE NOTE  Brittany Acosta [id number]  HISTORY OF PRESENT ILLNESS: Brittany Acosta is a 62 year old left-handed woman with history of bipolar disorder, on disability, PTSD, obesity and diabetes type II who presents for migraines.  She was previously seen by Dr. Murriel Hopper, who has since left [facility].  Records and images were personally reviewed where available.    Onset:  Many years ago Location:  right temple Quality:  throbbing Intensity:  3 to 9/10 Aura:  no Associated symptoms:  Sometimes nausea, vomiting, photophobia, phonophobia, osmophobia Activity:  Needs to lay down in dark room Duration:  1 to 2 days Frequency:  Initially almost daily.  Recently had her eye prescription changed and now about once a week. Triggers/Exacerbating factors:  none Relieving factors:  Laying down in dark  Prior abortive therapy:  None.  Question of Relpax or Axert? Prior preventative therapy:  Topamax 50mg  qhs (was taking it for mood, but discontinued in attempt to reduce number of neuroleptic medications)  Current abortive therapy: Not certain Current preventative therapy:  None  Other medication:  Seroquel (weaning off), Zoloft, Lamictal 200mg  BID, Mg 200mg , Klonopin 2mg  qhs, Co Q10  She says she sleeps well but feels fatigued during the day.  She had a sleep study performed on 10/11/12, which revealed mild OSA/hypopnea syndrome.  We offered referral to sleep specialist, but she denied referral at that time.  PAST MEDICAL HISTORY: Past Medical History  Diagnosis Date  . GOITER, MULTINODULAR   . HYPOTHYROIDISM   . HYPERCHOLESTEROLEMIA   . Morbid obesity   . BIPOLAR DISORDER UNSPECIFIED     depression  . Chronic diastolic heart failure   . ALLERGIC RHINITIS   . FIBROMYALGIA   . GERD (gastroesophageal reflux disease)   . History of wrist fracture     rt wrist  . Depression   . Type II diabetes mellitus   . Neuropathy due to secondary diabetes   . Iron  deficiency anemia   . Kidney stones     sees urologist @ [facility]  . History of gout     "haven't had it in several years" (02/08/2012)  . PTSD (post-traumatic stress disorder)     MEDICATIONS: Current Outpatient Prescriptions on File Prior to Visit  Medication Sig Dispense Refill  . Alpha-Lipoic Acid 300 MG TABS Take 300 mg by mouth daily.       . ARIPiprazole (ABILIFY) 20 MG tablet Take 20 mg by mouth at bedtime.       Marland Kitchen aspirin 81 MG tablet Take 81 mg by mouth daily.      Marland Kitchen atorvastatin (LIPITOR) 20 MG tablet Take 1 tablet (20 mg total) by mouth daily.  90 tablet  3  . Cholecalciferol (VITAMIN D3) 400 UNITS CAPS Take 400 Units by mouth daily.       . clonazePAM (KLONOPIN) 1 MG tablet Take 2 mg by mouth at bedtime.       . Coenzyme Q10 120 MG CAPS Take 120 mg by mouth daily.      . furosemide (LASIX) 40 MG tablet Take 40 mg by mouth daily.      . hydrochlorothiazide 25 MG tablet Take 25 mg by mouth every morning.       . insulin NPH (HUMULIN N,NOVOLIN N) 100 UNIT/ML injection Inject 38 Units into the skin 2 (two) times daily.       . Lactobacillus (ULTIMATE PROBIOTIC FORMULA PO) Take 1 capsule by mouth every evening.       Marland Kitchen  lamoTRIgine (LAMICTAL) 200 MG tablet Take 200 mg by mouth daily at 12 noon.       . Magnesium 200 MG TABS Take 200 mg by mouth daily.      . Manganese Gluconate 50 MG TABS Take 50 mg by mouth every evening.       . meloxicam (MOBIC) 15 MG tablet Take 15 mg by mouth daily.       . Multiple Vitamin (MULTIVITAMIN WITH MINERALS) TABS Take 1 tablet by mouth every evening.      Marland Kitchen omeprazole (PRILOSEC) 20 MG capsule TAKE 1 CAPSULE TWICE DAILY  180 capsule  1  . oxybutynin (DITROPAN-XL) 10 MG 24 hr tablet Take 10 mg by mouth every evening.       . potassium citrate (UROCIT-K 10) 10 MEQ (1080 MG) SR tablet Take 20 mEq by mouth 2 (two) times daily.       . prazosin (MINIPRESS) 5 MG capsule Take 10 mg by mouth every evening.      Marland Kitchen PREMARIN 0.3 MG tablet TAKE 1 TABLET DAILY  90  tablet  1  . QUEtiapine Fumarate (SEROQUEL XR) 150 MG TB24 Take 150-450 mg by mouth 2 (two) times daily. Take 1 every morning and 2  At bedtime      . SYNTHROID 50 MCG tablet TAKE 1 TABLET DAILY  90 tablet  1  . Thiamine HCl (VITAMIN B-1) 100 MG tablet Take 100 mg by mouth every evening.        No current facility-administered medications on file prior to visit.    ALLERGIES: No Known Allergies  FAMILY HISTORY: Family History  Problem Relation Age of Onset  . Hypertension Mother   . Diabetes Father   . Hypertension Father   . Hyperlipidemia Father   . Heart attack Other   . Coronary artery disease Other     SOCIAL HISTORY: History   Social History  . Marital Status: Married    Spouse Name: N/A    Number of Children: 2  . Years of Education: N/A   Occupational History  . disability    Social History Main Topics  . Smoking status: Former Smoker -- 1.00 packs/day for 6 years    Types: Cigarettes    Quit date: 04/06/1983  . Smokeless tobacco: Never Used     Comment: Married, lives with spouse. Disable- 2 grown kids-6 g-kids  . Alcohol Use: No     Comment: none  . Drug Use: No  . Sexual Activity: Not Currently   Other Topics Concern  . Not on file   Social History Narrative   Married, lives with spouse - 2 adult children   diabled    PHYSICAL EXAM: Filed Vitals:   12/25/12 1249  BP: 140/74  Pulse: 88  Temp: 98.1 F (36.7 C)   General: No acute distress Head:  Normocephalic/atraumatic Neck: supple, no paraspinal tenderness, full range of motion Back: No paraspinal tenderness Neurological Exam: alert and oriented to person, place, and time, speech fluent and not dysarthric, language intact.  CN II-XII intact, bulk and tone normal, muscle strength 5/5 throughout, endorses reduced temperature and vibration sensation in toes, deep tendon reflexes 2+ and symmetric in UEs, absent in LEs, toes downgoing, finger to nose and heel to shin testing intact, gait mildly  wide-based, no ataxia, Romberg negative.  IMPRESSION & PLAN: Chronic migraine, improved since vision correction.  Will continue to optimize control. 1.  For preventative therapy, will start propranolol 40mg  BID (prefer not to  add another neuroleptic).  Topamax was recently discontinued, however it is typically very effective for migraine prophylaxis. 2.  Instructed her to go home and call us with the abortive medication she uses, so I can refill.  Instructed about prevention of rebound headache. 3.  Will refer to sleep specialist since proper sleep hygiene can help with both migraine and depression.  She agrees to referral. 4.  Proper diet, exercise and hydration. 5.  Follow up in 3 months.  Call with questions or concerns.  Shon Millet, DO  CC:  Rene Paci, MD  Archer Asa, MD

## 2012-12-25 NOTE — Patient Instructions (Addendum)
1.  To help reduce the frequency of headaches, we will start propranolol 40mg  twice daily.  Side effects include sleepiness or dizziness or reduced heart rate.   2.  When you get home, call to let us know what medication you take for the migraine attacks so I can refill them. 3.  We will refer to sleep specialist, as adequate management of your sleep may help headaches and depression. 4.  Proper diet, exercise, stress management 5.  Follow up in 3 months.  Call with questions or concerns.  We have referred you to [facility] for your sleep apnea. You have an appointment with Dr. Shelle Iron at [address] in [place] on Thursday, September 24th at 11:15 am. They are located on the second floor of the [employer].   [phone]

## 2012-12-25 NOTE — Telephone Encounter (Signed)
Patient Information:  Caller Name: Kenneshia  Phone: [phone]  Patient: Brittany Acosta, Brittany Acosta  Gender: Female  DOB: 1951/04/05  Age: 62 Years  PCP: Rene Paci (Adults only)  Office Follow Up:  Does the office need to follow up with this patient?: No  Instructions For The Office: N/A  RN Note:  RN offered appt for today (12/25/12) but pt declined.  Appt scheduled for 12/26/12 at 0845 with Dr Posey Rea  Symptoms  Reason For Call & Symptoms: flu like symptoms x 1 week.  Body aches is the main symptoms.  Pt also reports chills but no fever  Reviewed Health History In EMR: Yes  Reviewed Medications In EMR: Yes  Reviewed Allergies In EMR: Yes  Reviewed Surgeries / Procedures: Yes  Date of Onset of Symptoms: 12/06/2012  Guideline(s) Used:  Influenza - Seasonal  Disposition Per Guideline:   See Today in Office  Reason For Disposition Reached:   Earache  Advice Given:  N/A  Patient Refused Recommendation:  Patient Refused Appt, Patient Requests Appt At Later Date  pt states she has appts all day and cannot make it today

## 2012-12-26 ENCOUNTER — Encounter: Payer: Self-pay | Admitting: Internal Medicine

## 2012-12-26 ENCOUNTER — Ambulatory Visit (INDEPENDENT_AMBULATORY_CARE_PROVIDER_SITE_OTHER): Payer: Medicare Other | Admitting: Internal Medicine

## 2012-12-26 ENCOUNTER — Other Ambulatory Visit (INDEPENDENT_AMBULATORY_CARE_PROVIDER_SITE_OTHER): Payer: Medicare Other

## 2012-12-26 VITALS — BP 140/76 | HR 76 | Temp 96.9°F | Resp 16 | Wt 252.0 lb

## 2012-12-26 DIAGNOSIS — R209 Unspecified disturbances of skin sensation: Secondary | ICD-10-CM

## 2012-12-26 DIAGNOSIS — M545 Low back pain, unspecified: Secondary | ICD-10-CM

## 2012-12-26 DIAGNOSIS — E559 Vitamin D deficiency, unspecified: Secondary | ICD-10-CM

## 2012-12-26 DIAGNOSIS — IMO0001 Reserved for inherently not codable concepts without codable children: Secondary | ICD-10-CM

## 2012-12-26 DIAGNOSIS — R202 Paresthesia of skin: Secondary | ICD-10-CM

## 2012-12-26 LAB — URINALYSIS
Bilirubin Urine: NEGATIVE
Hgb urine dipstick: NEGATIVE
Ketones, ur: NEGATIVE
Leukocytes, UA: NEGATIVE
Nitrite: NEGATIVE
Specific Gravity, Urine: 1.01 (ref 1.000–1.030)
Total Protein, Urine: NEGATIVE
Urine Glucose: NEGATIVE
Urobilinogen, UA: 0.2 (ref 0.0–1.0)
pH: 7.5 (ref 5.0–8.0)

## 2012-12-26 LAB — CBC WITH DIFFERENTIAL/PLATELET
Basophils Absolute: 0 10*3/uL (ref 0.0–0.1)
Basophils Relative: 0.3 % (ref 0.0–3.0)
Eosinophils Absolute: 0.2 10*3/uL (ref 0.0–0.7)
Eosinophils Relative: 1.9 % (ref 0.0–5.0)
HCT: 38.8 % (ref 36.0–46.0)
Hemoglobin: 12.9 g/dL (ref 12.0–15.0)
Lymphocytes Relative: 24 % (ref 12.0–46.0)
Lymphs Abs: 2.9 10*3/uL (ref 0.7–4.0)
MCHC: 33.1 g/dL (ref 30.0–36.0)
MCV: 81 fl (ref 78.0–100.0)
Monocytes Absolute: 0.6 10*3/uL (ref 0.1–1.0)
Monocytes Relative: 4.7 % (ref 3.0–12.0)
Neutro Abs: 8.3 10*3/uL — ABNORMAL HIGH (ref 1.4–7.7)
Neutrophils Relative %: 69.1 % (ref 43.0–77.0)
Platelets: 306 10*3/uL (ref 150.0–400.0)
RBC: 4.8 Mil/uL (ref 3.87–5.11)
RDW: 14.6 % (ref 11.5–14.6)
WBC: 12 10*3/uL — ABNORMAL HIGH (ref 4.5–10.5)

## 2012-12-26 LAB — HEPATIC FUNCTION PANEL
ALT: 29 U/L (ref 0–35)
AST: 28 U/L (ref 0–37)
Albumin: 3.7 g/dL (ref 3.5–5.2)
Alkaline Phosphatase: 116 U/L (ref 39–117)
Bilirubin, Direct: 0 mg/dL (ref 0.0–0.3)
Total Bilirubin: 0.5 mg/dL (ref 0.3–1.2)
Total Protein: 7.1 g/dL (ref 6.0–8.3)

## 2012-12-26 LAB — BASIC METABOLIC PANEL
BUN: 11 mg/dL (ref 6–23)
CO2: 31 mEq/L (ref 19–32)
Calcium: 9.4 mg/dL (ref 8.4–10.5)
Chloride: 98 mEq/L (ref 96–112)
Creatinine, Ser: 0.8 mg/dL (ref 0.4–1.2)
GFR: 80.63 mL/min (ref >60.00)
Glucose, Bld: 182 mg/dL — ABNORMAL HIGH (ref 70–99)
Potassium: 3.6 mEq/L (ref 3.5–5.1)
Sodium: 137 mEq/L (ref 135–145)

## 2012-12-26 LAB — SEDIMENTATION RATE: Sed Rate: 30 mm/hr — ABNORMAL HIGH (ref 0–22)

## 2012-12-26 LAB — CK: Total CK: 87 U/L (ref 7–177)

## 2012-12-26 LAB — VITAMIN B12: Vitamin B-12: 1045 pg/mL — ABNORMAL HIGH (ref 211–911)

## 2012-12-26 MED ORDER — TRAMADOL HCL 50 MG PO TABS: 50.0000 mg | ORAL_TABLET | Freq: Two times a day (BID) | ORAL | Status: DC | PRN

## 2012-12-26 NOTE — Patient Instructions (Signed)
Hold Lipitor x 2-3 weeks

## 2012-12-26 NOTE — Progress Notes (Signed)
Subjective:    HPI See CC No chief complaint on file. C/o diffuse body aches 8/10 in intensity and chills, tired after her last flu shot Also continue R flank pain without change from last OV also reviewed chronic medical issues  Past Medical History  Diagnosis Date  . GOITER, MULTINODULAR   . HYPOTHYROIDISM   . HYPERCHOLESTEROLEMIA   . Morbid obesity   . BIPOLAR DISORDER UNSPECIFIED     depression  . Chronic diastolic heart failure   . ALLERGIC RHINITIS   . FIBROMYALGIA   . GERD (gastroesophageal reflux disease)   . History of wrist fracture     rt wrist  . Depression   . Type II diabetes mellitus   . Neuropathy due to secondary diabetes   . Iron deficiency anemia   . Kidney stones     sees urologist @ [facility]  . History of gout     "haven't had it in several years" (02/08/2012)  . PTSD (post-traumatic stress disorder)     Review of Systems  Constitutional: Positive for chills and fatigue.  Respiratory: Negative for cough and shortness of breath.   Cardiovascular: Negative for palpitations.  Gastrointestinal: Positive for diarrhea (resolved with immodium yesterday). Negative for nausea, vomiting and blood in stool.  Musculoskeletal: Positive for myalgias.  Skin: Negative for rash.  Neurological: Positive for weakness (generalized), light-headedness and headaches. Negative for dizziness and facial asymmetry.       Objective:   Physical Exam BP 140/76  Pulse 76  Temp(Src) 96.9 F (36.1 C) (Oral)  Resp 16  Wt 252 lb (114.306 kg)  BMI 44.65 kg/m2 Wt Readings from Last 3 Encounters:  12/26/12 252 lb (114.306 kg)  12/25/12 251 lb (113.853 kg)  12/25/12 251 lb (113.853 kg)   Constitutional: She is obese, but appears well-developed and well-nourished. No distress.  HENT: NCAT, TMs clear B without erythema or effusion, no cerumen impaction - sinus nontender to palp Neck: Normal range of motion. Neck supple. No JVD present. No thyromegaly present.   Cardiovascular: Normal rate, regular rhythm and normal heart sounds.  No murmur heard. No BLE edema. Pulmonary/Chest: Effort normal and breath sounds normal. No respiratory distress. She has no wheezes.  Abdomen: SNTND, +BS, no flank pain to palpation Musculoskeletal: Normal range of motion, no joint effusions. No gross deformities. Joints are pinful w/ROM/palpation except for hands and feet. Muscles are tender as well... Neurological: She is alert and oriented to person, place, and time. No cranial nerve deficit. Coordination normal.  Skin: Skin is warm and dry. No rash noted. No erythema.  Psychiatric: She has a normal mood and affect. Her behavior is normal. Judgment and thought content normal.    Lab Results  Component Value Date   WBC 7.7 12/12/2012   HGB 12.2 12/12/2012   HCT 36.6 12/12/2012   PLT 377.0 12/12/2012   GLUCOSE 139* 12/06/2012   CHOL 116 12/06/2012   TRIG 130.0 12/06/2012   HDL 53.20 12/06/2012   LDLDIRECT 56.4 06/11/2011   LDLCALC 37 12/06/2012   ALT 34 12/12/2012   AST 32 12/12/2012   NA 134* 12/06/2012   K 3.6 12/06/2012   CL 95* 12/06/2012   CREATININE 0.8 12/06/2012   BUN 13 12/06/2012   CO2 30 12/06/2012   TSH 2.93 12/12/2012   INR 1.01 02/04/2012   HGBA1C 8.3* 12/25/2012   MICROALBUR 0.4 12/06/2012       Assessment & Plan:  See problem list. Medications and labs reviewed today.  Myalgias, flu-like  reaction to flu vaccine 3 week? - not better. Suspect viral infection overlap given transient diarrhea? -reassurance and education provided. Okay to continue Tylenol and Imodium as needed. Hold Lipitor to see if it would help. Labs. F/u w/Dr Felicity Coyer in 2 wks. Tramadol prn  R flank pain, ongoing >3 weeks - some better now - exam benign but hx kidney stone requiring intervention in 1997 - neg UA last week, reports 06/2012 KUB with uro at [facility] without stones - on NSAIDs as for osteoarthritis, improved but not resolved -  check CBC, LFTs and Ct a/p given overlap with other symptoms   Paresthesias.  Labs

## 2012-12-27 ENCOUNTER — Institutional Professional Consult (permissible substitution): Payer: Medicare Other | Admitting: Pulmonary Disease

## 2012-12-27 ENCOUNTER — Telehealth: Payer: Self-pay | Admitting: Neurology

## 2012-12-27 NOTE — Telephone Encounter (Signed)
Called and spoke with Brittany Acosta. She states she is a little better. Information given as per Dr. Everlena Cooper. Reinforced that if sx worsened or if she did not feel better to call 911 or proceed to the ER. She states she will.

## 2012-12-27 NOTE — Telephone Encounter (Signed)
Patient called to say the medication that she took in the past for her migraines was Axert 12.5 mg po. She states Dr. Everlena Cooper wanted her to call and let him know what it was as she could not remember when she was in the office.  **Dr. Everlena Cooper, just a FYI.

## 2012-12-27 NOTE — Telephone Encounter (Signed)
Those are unusual symptoms to be side effect of propranolol, especially after only one pill.  I recommend contacting her PCP or going to the ED for further evaluation.

## 2012-12-27 NOTE — Telephone Encounter (Signed)
Picked up a call from the patient who states she took the first pill that Dr. Everlena Cooper ordered for her (Propranolol) when she saw him on 09/22 and about 3 hours later she experienced jaw pain on the left, neck pain and sweating. She also reports that she is packing and working real hard as she is getting ready to move. Asked about if she was lifting and/or pushing and pulling boxes and she reports that she has been. She denies any cardiac history but I did tell her that jaw pain and sweating were actually classic sx of a heart attack. She states she does feel some better now but that her jaw still hurts a little. I told her that I would let Dr. Everlena Cooper know and see what he had to say. Reinforced that if her sx worsened she she go to the ER to rule out heart problems. The patient states she understands. **Dr. Everlena Cooper, please advise.

## 2013-01-09 ENCOUNTER — Ambulatory Visit (INDEPENDENT_AMBULATORY_CARE_PROVIDER_SITE_OTHER): Payer: Medicare Other | Admitting: Internal Medicine

## 2013-01-09 ENCOUNTER — Encounter: Payer: Self-pay | Admitting: Internal Medicine

## 2013-01-09 VITALS — BP 122/70 | HR 70 | Temp 97.8°F | Wt 253.8 lb

## 2013-01-09 DIAGNOSIS — I10 Essential (primary) hypertension: Secondary | ICD-10-CM

## 2013-01-09 DIAGNOSIS — IMO0001 Reserved for inherently not codable concepts without codable children: Secondary | ICD-10-CM

## 2013-01-09 MED ORDER — GABAPENTIN 300 MG PO CAPS: 300.0000 mg | ORAL_CAPSULE | Freq: Three times a day (TID) | ORAL | Status: DC

## 2013-01-09 NOTE — Assessment & Plan Note (Signed)
BP Readings from Last 3 Encounters:  01/09/13 122/70  12/26/12 140/76  12/25/12 140/74   The current medical regimen is effective;  continue present plan and medications.

## 2013-01-09 NOTE — Progress Notes (Signed)
Subjective:    Patient ID: Brittany Acosta, female    DOB: Jul 23, 1950, 62 y.o.   MRN: [id number]  HPI See CC Chief Complaint  Patient presents with  . Follow-up    2 weeks     Past Medical History  Diagnosis Date  . GOITER, MULTINODULAR   . HYPOTHYROIDISM   . HYPERCHOLESTEROLEMIA   . Morbid obesity   . BIPOLAR DISORDER UNSPECIFIED     depression  . Chronic diastolic heart failure   . ALLERGIC RHINITIS   . FIBROMYALGIA   . GERD (gastroesophageal reflux disease)   . History of wrist fracture     rt wrist  . Depression   . Type II diabetes mellitus   . Neuropathy due to secondary diabetes   . Iron deficiency anemia   . Kidney stones     sees urologist @ [facility]  . History of gout     "haven't had it in several years" (02/08/2012)  . PTSD (post-traumatic stress disorder)     Review of Systems  Constitutional: Positive for chills and fatigue. Negative for fever.  Respiratory: Negative for cough and shortness of breath.   Cardiovascular: Negative for palpitations and leg swelling.  Gastrointestinal: Negative for nausea, vomiting, diarrhea and blood in stool.  Musculoskeletal: Positive for myalgias. Negative for joint swelling.  Skin: Negative for color change and rash.  Neurological: Positive for weakness (generalized), light-headedness and headaches. Negative for dizziness and facial asymmetry.       Objective:   Physical Exam BP 122/70  Pulse 70  Temp(Src) 97.8 F (36.6 C) (Oral)  Wt 253 lb 12.8 oz (115.123 kg)  BMI 44.97 kg/m2  SpO2 95% Wt Readings from Last 3 Encounters:  01/09/13 253 lb 12.8 oz (115.123 kg)  12/26/12 252 lb (114.306 kg)  12/25/12 251 lb (113.853 kg)   Constitutional: She is obese, but appears well-developed and well-nourished. No distress.  Neck: Normal range of motion. Neck supple. No JVD present. No thyromegaly present.  Cardiovascular: Normal rate, regular rhythm and normal heart sounds.  No murmur heard. No BLE edema. Pulmonary/Chest:  Effort normal and breath sounds normal. No respiratory distress. She has no wheezes.  Musculoskeletal: Normal range of motion, no joint effusions. No gross deformities Neurological: She is alert and oriented to person, place, and time. No cranial nerve deficit. Coordination normal.  Skin: Skin is warm and dry. No rash noted. No erythema.  Psychiatric: She has a normal mood and affect. Her behavior is normal. Judgment and thought content normal.   Lab Results  Component Value Date   WBC 12.0* 12/26/2012   HGB 12.9 12/26/2012   HCT 38.8 12/26/2012   PLT 306.0 12/26/2012   GLUCOSE 182* 12/26/2012   CHOL 116 12/06/2012   TRIG 130.0 12/06/2012   HDL 53.20 12/06/2012   LDLDIRECT 56.4 06/11/2011   LDLCALC 37 12/06/2012   ALT 29 12/26/2012   AST 28 12/26/2012   NA 137 12/26/2012   K 3.6 12/26/2012   CL 98 12/26/2012   CREATININE 0.8 12/26/2012   BUN 11 12/26/2012   CO2 31 12/26/2012   TSH 2.93 12/12/2012   INR 1.01 02/04/2012   HGBA1C 8.3* 12/25/2012   MICROALBUR 0.4 12/06/2012   Ct Abdomen Pelvis Wo Contrast  12/12/2012   CLINICAL DATA:  Right flank pain. History of kidney stones.  EXAM: CT ABDOMEN AND PELVIS WITHOUT CONTRAST  TECHNIQUE: Multidetector CT imaging of the abdomen and pelvis was performed following the standard protocol without intravenous contrast.  COMPARISON:  01/21/2012  FINDINGS: Lung bases are clear. No effusions. Heart is normal size.  Prior cholecystectomy. Liver, spleen, adrenals are unremarkable. Mild fatty replacement of the pancreas without focal abnormality. Small transverse duodenal diverticulum.  No renal or ureteral stones. No hydronephrosis.  Bowel grossly unremarkable. No free fluid, free air, or adenopathy.  Aorta is normal caliber. There is a retro aortic left renal vein.  No acute bony abnormality. Degenerative changes at L5-S1. Small 's scattered sclerotic foci within the right sacrum and right iliac bone, presumably: [place]. These are unchanged since prior study.  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 This currently measures 5.5 x 3.9 cm compared with 5.0 x 3.4 cm previously. The slight difference in size may be technical. This remains well-circumscribed and measures water density most compatible with a mesenteric/peritoneal cyst.  IMPRESSION: No renal or ureteral stones. No hydronephrosis.  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   Electronically Signed   By: Charlett Nose M.D.   On: 12/12/2012 13:02     Assessment & Plan:  See problem list. Medications and labs reviewed today.  Myalgias, ongoing >1 months following flu vaccine early 12/2012?  Normal lab evaluation 12/26/12 reviewed Unimproved with DC of statin Tramadol ineffective relief  Will tx with gabapentin for neuropathic type pain/chronic FM overlap - consider Lyrica if unimproved   reassurance and education provided. Okay to continue Tylenol as needed

## 2013-01-09 NOTE — Assessment & Plan Note (Signed)
Diffuse ache - exacerbated by flu vaccination early September 2014 prescribe gabapentin today -continue chronic "vitamins" with NSAIDs/Tylenol -

## 2013-01-09 NOTE — Patient Instructions (Signed)
It was good to see you today. We have reviewed your prior records including labs and tests today Medications reviewed and updated Begin gabapentin for pain symptoms and fibromyalgia flare -start once daily at bedtime for one week, then twice daily for one week, then increase to 3 times daily to control pain Your prescription(s) have been submitted to your local pharmacy. Please take as directed and contact our office if you believe you are having problem(s) with the medication(s). Please schedule followup in 3-4 weeks, call sooner if problems.

## 2013-01-15 ENCOUNTER — Telehealth: Payer: Self-pay | Admitting: *Deleted

## 2013-01-15 ENCOUNTER — Telehealth: Payer: Self-pay | Admitting: Internal Medicine

## 2013-01-15 DIAGNOSIS — IMO0001 Reserved for inherently not codable concepts without codable children: Secondary | ICD-10-CM

## 2013-01-15 NOTE — Telephone Encounter (Signed)
[facility] Triage Call Report Triage Record Num: [id number] Operator: Devin Going Patient Name: Brittany Acosta Call Date & Time: 01/14/2013 8:51:21AM Patient Phone: [phone] PCP: Rene Paci Patient Gender: Female PCP Fax : [phone] Patient DOB: 1951/01/13 Practice Name: [facility] Reason for Call: Caller: Belinda Fisher; PCP: Rene Paci (Adults only); CB#: [phone]; Call regarding Body Aches Post Flu Vaccine; Onset 12/15/12 after the flu vaccine began to get body aches. Went into the Dr. three times to be evaluated and was told did not think it was the flu vaccine. Got Tramadol and Gabapentin and it has not really helped. Did some blood tests all were normal. No fever. Triaged per Flu-Like Symptoms with a See Provider in 8 hrs. for evaluated by provider and symptoms are worsening. Advised pt. to call the office in the am for an appt. to be re-evaluated. Pt. agrees to do this. Care Advice for rest and fluids since no respiratory problems. Protocol(s) Used: Flu-Like Symptoms Recommended Outcome per Protocol: Call Provider within 8 Hours Reason for Outcome: Evaluated by provider AND symptoms worsening when following recommended treatment plan for 48 hours Care Advice: ~ Rest until symptoms improve. See a provider immediately or go to the Emergency Department if having chest pain with breathing, change in level of consciousness, new seizure, vomiting and unable to keep fluids down for 8 hours or more, or has not urinated for 8 or more hours. ~ Total water intake includes drinking water, water in beverages, and water contained in food. Fluids make up about 80% of the body's total hydration need. Individual fluid requirement to maintain hydration vary based on physical activity, environmental factors and illness. Limit fluids that contain sugar, caffeine, or alcohol. Urine will be very light yellow color when you drink enough fluids. ~ 01/14/2013 9:10:51AM Page  1 of 1 [id number]

## 2013-01-15 NOTE — Telephone Encounter (Signed)
She should followup with her rheumatologist -Dr. Dierdre Forth -to see what other treatments for her fibromyalgia flare can be considered.  No new recommendations for medication changes

## 2013-01-15 NOTE — Telephone Encounter (Signed)
Left msg on vm stating saw md last Tuesday. Still not feeling any better. Wanting md advisement on what to do...lmb

## 2013-01-15 NOTE — Telephone Encounter (Signed)
Pt called Dr. Shawnee Knapp office.  It has been since 2011 since she last saw him.  That office needs a referral.

## 2013-01-15 NOTE — Telephone Encounter (Signed)
Notified pt with md response.../lmb 

## 2013-01-15 NOTE — Telephone Encounter (Signed)
Order for rheumatology referral placed as requested. Thanks

## 2013-01-16 NOTE — Telephone Encounter (Signed)
Notified pt with md response.../lmb 

## 2013-01-18 ENCOUNTER — Encounter: Payer: Self-pay | Admitting: Internal Medicine

## 2013-01-24 ENCOUNTER — Encounter: Payer: Self-pay | Admitting: Internal Medicine

## 2013-01-24 ENCOUNTER — Ambulatory Visit (INDEPENDENT_AMBULATORY_CARE_PROVIDER_SITE_OTHER): Payer: Medicare Other | Admitting: Internal Medicine

## 2013-01-24 VITALS — BP 120/68 | HR 62 | Temp 98.2°F | Wt 257.1 lb

## 2013-01-24 DIAGNOSIS — R1011 Right upper quadrant pain: Secondary | ICD-10-CM

## 2013-01-24 MED ORDER — ONDANSETRON HCL 4 MG PO TABS: 4.0000 mg | ORAL_TABLET | Freq: Three times a day (TID) | ORAL | Status: DC | PRN

## 2013-01-24 MED ORDER — HYOSCYAMINE SULFATE ER 0.375 MG PO TB12: 0.3750 mg | ORAL_TABLET | Freq: Two times a day (BID) | ORAL | Status: DC | PRN

## 2013-01-24 NOTE — Progress Notes (Signed)
Subjective:    Patient ID: Brittany Acosta, female    DOB: [birth year], 62 y.o.   MRN: [id number]  Flank Pain This is a new problem. The current episode started in the past 7 days. The problem occurs constantly. The problem is unchanged. Pain location: right upper quadrant. Quality: tearing. The pain does not radiate. The pain is at a severity of 9/10. The pain is moderate. The pain is the same all the time. Pertinent negatives include no chest pain, fever or pelvic pain. Treatments tried: Alka selzer. The treatment provided no relief.  Patient also reports single episode of small amount of brown hemoptysis on Monday.  None since.   Past Medical History  Diagnosis Date  . GOITER, MULTINODULAR   . HYPOTHYROIDISM   . HYPERCHOLESTEROLEMIA   . Morbid obesity   . BIPOLAR DISORDER UNSPECIFIED     depression  . Chronic diastolic heart failure   . ALLERGIC RHINITIS   . FIBROMYALGIA   . GERD (gastroesophageal reflux disease)   . History of wrist fracture     rt wrist  . Depression   . Type II diabetes mellitus   . Neuropathy due to secondary diabetes   . Iron deficiency anemia   . Kidney stones     sees urologist @ [facility]  . History of gout     "haven't had it in several years" (02/08/2012)  . PTSD (post-traumatic stress disorder)      Review of Systems  Constitutional: Negative for fever, activity change and appetite change.  Respiratory: Positive for shortness of breath (chronic, unchanged).   Cardiovascular: Negative for chest pain and leg swelling.  Gastrointestinal: Positive for nausea and constipation (chronic, unchanged). Negative for vomiting and diarrhea.       Increased belching and gas  Genitourinary: Positive for flank pain. Negative for urgency, hematuria and pelvic pain.       Objective:   Physical Exam  Constitutional: She is oriented to person, place, and time. She appears well-nourished. No distress.  obese  Cardiovascular: Normal rate, regular rhythm and normal heart  sounds.   Pulmonary/Chest: Effort normal and breath sounds normal. No respiratory distress.  Abdominal: Soft. Bowel sounds are normal. She exhibits no distension and no mass. There is tenderness (right upper quadrant at costal margin). There is no rebound and no guarding.  Neurological: She is alert and oriented to person, place, and time.  Skin: Skin is warm and dry. No rash noted. She is not diaphoretic.  Psychiatric: She has a normal mood and affect. Her behavior is normal. Judgment and thought content normal.   BP 120/68  Pulse 62  Temp(Src) 98.2 F (36.8 C) (Oral)  Wt 257 lb 1.9 oz (116.629 kg)  BMI 45.56 kg/m2  SpO2 97%  Lab Results  Component Value Date   WBC 12.0* 12/26/2012   HGB 12.9 12/26/2012   HCT 38.8 12/26/2012   PLT 306.0 12/26/2012   GLUCOSE 182* 12/26/2012   CHOL 116 12/06/2012   TRIG 130.0 12/06/2012   HDL 53.20 12/06/2012   LDLDIRECT 56.4 06/11/2011   LDLCALC 37 12/06/2012   ALT 29 12/26/2012   AST 28 12/26/2012   NA 137 12/26/2012   K 3.6 12/26/2012   CL 98 12/26/2012   CREATININE 0.8 12/26/2012   BUN 11 12/26/2012   CO2 31 12/26/2012   TSH 2.93 12/12/2012   INR 1.01 02/04/2012   HGBA1C 8.3* 12/25/2012   MICROALBUR 0.4 12/06/2012       Assessment & Plan:  Right upper quadrant pain and cramping x72 hours. Patient is status post cholecystectomy. Mild nausea but no vomiting or bowel change. Afebrile. Denies potential food poisoning exposure or recent change in medications. Recent labs reviewed and normal. Will treat symptomatic cramping with Levbid twice daily and use Zofran as needed for nausea -patient will call if symptoms unimproved in next 72h to consider imaging, or call sooner if worse or go to the emergency room.

## 2013-01-24 NOTE — Patient Instructions (Signed)
It was good to see you today.  We have reviewed your prior records including labs and tests today  Medications reviewed and updated - take Zofran as needed for nausea and Levbid twice daily for cramping pain  Your prescription(s) have been submitted to your pharmacy. Please take as directed and contact our office if you believe you are having problem(s) with the medication(s).  Call if pain symptoms unimproved in next 72 hours, sooner if worse

## 2013-01-30 ENCOUNTER — Other Ambulatory Visit: Payer: Self-pay | Admitting: *Deleted

## 2013-01-30 ENCOUNTER — Other Ambulatory Visit: Payer: Self-pay | Admitting: Internal Medicine

## 2013-01-30 ENCOUNTER — Other Ambulatory Visit: Payer: Self-pay | Admitting: Endocrinology

## 2013-01-30 MED ORDER — FUROSEMIDE 40 MG PO TABS: 40.0000 mg | ORAL_TABLET | Freq: Every day | ORAL | Status: DC

## 2013-02-05 ENCOUNTER — Ambulatory Visit: Payer: Medicare Other | Admitting: Internal Medicine

## 2013-02-19 ENCOUNTER — Telehealth: Payer: Self-pay | Admitting: Neurology

## 2013-02-19 NOTE — Telephone Encounter (Signed)
Pt wants to know if it is necessary for her to follow up next month, why? / Please call / Sherri

## 2013-02-19 NOTE — Telephone Encounter (Signed)
Called and spoke with Brittany Acosta. She reports she is doing better and didn't feel as if she needed to come in to see Dr. Everlena Cooper in December. I told her that would be her call and she asked that I cancel the appointment. I told her that I would but to call us if she needed [place]. She states she will.

## 2013-02-22 ENCOUNTER — Telehealth: Payer: Self-pay | Admitting: Endocrinology

## 2013-02-22 NOTE — Telephone Encounter (Signed)
Pt states she left message with nurse yesterday regarding her readings  Call back [phone]  Thank You :)

## 2013-03-14 ENCOUNTER — Other Ambulatory Visit: Payer: Self-pay | Admitting: Internal Medicine

## 2013-03-19 ENCOUNTER — Ambulatory Visit: Payer: Medicare Other | Admitting: Neurology

## 2013-03-19 ENCOUNTER — Ambulatory Visit (INDEPENDENT_AMBULATORY_CARE_PROVIDER_SITE_OTHER): Payer: Medicare Other | Admitting: Endocrinology

## 2013-03-19 ENCOUNTER — Encounter: Payer: Self-pay | Admitting: Endocrinology

## 2013-03-19 ENCOUNTER — Ambulatory Visit: Payer: Medicare Other | Admitting: Endocrinology

## 2013-03-19 VITALS — BP 126/64 | HR 60 | Temp 97.0°F | Ht 63.0 in | Wt 264.0 lb

## 2013-03-19 DIAGNOSIS — M545 Low back pain, unspecified: Secondary | ICD-10-CM

## 2013-03-19 DIAGNOSIS — R209 Unspecified disturbances of skin sensation: Secondary | ICD-10-CM

## 2013-03-19 DIAGNOSIS — E1065 Type 1 diabetes mellitus with hyperglycemia: Secondary | ICD-10-CM

## 2013-03-19 DIAGNOSIS — IMO0001 Reserved for inherently not codable concepts without codable children: Secondary | ICD-10-CM

## 2013-03-19 DIAGNOSIS — E1049 Type 1 diabetes mellitus with other diabetic neurological complication: Secondary | ICD-10-CM

## 2013-03-19 DIAGNOSIS — E559 Vitamin D deficiency, unspecified: Secondary | ICD-10-CM

## 2013-03-19 DIAGNOSIS — R202 Paresthesia of skin: Secondary | ICD-10-CM

## 2013-03-19 LAB — HEMOGLOBIN A1C: Hgb A1c MFr Bld: 7.6 % — ABNORMAL HIGH (ref 4.6–6.5)

## 2013-03-19 NOTE — Progress Notes (Signed)
Subjective:    Patient ID: Brittany Acosta, female    DOB: Sep 08, 1950, 62 y.o.   MRN: [id number]  HPI Pt returns for f/u of insulin-requiring DM (dx'ed 2002, on a routine blood test; she has moderate neuropathy of the lower extremities; no associated chronic complications; she has been on insulin since 2004; therapy limited by pt's need for a simple, inexpensive regimen; she has never had severe hypoglycemia or DKA).  she brings a record of her cbg's which i have reviewed today.  It varies from 55-200, but most are in the low-100's.  There is no trend throughout the day, except it is lowest in the late morning.  pt states she feels well in general.   Past Medical History  Diagnosis Date  . GOITER, MULTINODULAR   . HYPOTHYROIDISM   . HYPERCHOLESTEROLEMIA   . Morbid obesity   . BIPOLAR DISORDER UNSPECIFIED     depression  . Chronic diastolic heart failure   . ALLERGIC RHINITIS   . FIBROMYALGIA   . GERD (gastroesophageal reflux disease)   . History of wrist fracture     rt wrist  . Depression   . Type II diabetes mellitus   . Neuropathy due to secondary diabetes   . Iron deficiency anemia   . Kidney stones     sees urologist @ [facility]  . History of gout     "haven't had it in several years" (02/08/2012)  . PTSD (post-traumatic stress disorder)     Past Surgical History  Procedure Laterality Date  . Cholecystectomy  1985  . Abdominal hysterectomy  1976  . Tubal ligation  1972  . Right nasal surgery  08/1988  . Right sinus removed  08/1989    tooth partial  . Percitania stone removed (l) kidney  1992  . Lithotripsy (l) kidney  1997  . Left cystoscopy   1990  . Thyroidectomy  04/22/2011    Procedure: THYROIDECTOMY;  Surgeon: Antony Contras, MD;  Location: [facility] OR;  Service: ENT;  Laterality: N/A;  TOTAL THYROIDECOTMY  . Cataract extraction, bilateral  02/2011    epps  . Total knee arthroplasty  06/18/2011    Procedure: TOTAL KNEE ARTHROPLASTY;  Surgeon: Nestor Lewandowsky, MD;  Location: [facility]  OR;  Service: Orthopedics;  Laterality: Left;  DEPUY  . Total knee arthroplasty  02/07/2012    Procedure: TOTAL KNEE ARTHROPLASTY;  Surgeon: Nestor Lewandowsky, MD;  Location: [facility] OR;  Service: Orthopedics;  Laterality: Right;  . Incisional breast biopsy  2000    right  . Cardiac catheterization  2001    sees Dr Peter [place]  . Knee arthroscopy  08/2009    right  . Knee arthroscopy with medial menisectomy      left  . Excisional hemorrhoidectomy      "dr cut out in his office" (02/08/2012)  . Left oophorectomy  1980  . Rotator cuff repair  2013    right shoulder x 2  . Joint replacement      rt knee  . Partial mastectomy with needle localization Left 09/01/2012    Procedure: PARTIAL MASTECTOMY WITH NEEDLE LOCALIZATION;  Surgeon: Ernestene Mention, MD;  Location: [facility] OR;  Service: General;  Laterality: Left;  . Cataract extraction  2014    History   Social History  . Marital Status: Married    Spouse Name: N/A    Number of Children: 2  . Years of Education: N/A   Occupational History  . disability  Social History Main Topics  . Smoking status: Former Smoker -- 1.00 packs/day for 6 years    Types: Cigarettes    Quit date: 04/06/1983  . Smokeless tobacco: Never Used     Comment: Married, lives with spouse. Disable- 2 grown kids-6 g-kids  . Alcohol Use: No     Comment: none  . Drug Use: No  . Sexual Activity: Not Currently   Other Topics Concern  . Not on file   Social History Narrative   Married, lives with spouse - 2 adult children   diabled    Current Outpatient Prescriptions on File Prior to Visit  Medication Sig Dispense Refill  . Alpha-Lipoic Acid 300 MG TABS Take 300 mg by mouth daily.       . ARIPiprazole (ABILIFY) 20 MG tablet Take 20 mg by mouth at bedtime.       Marland Kitchen aspirin 81 MG tablet Take 81 mg by mouth daily.      Marland Kitchen atorvastatin (LIPITOR) 20 MG tablet Take 1 tablet (20 mg total) by mouth daily.  90 tablet  3  . Cholecalciferol (VITAMIN D-3 PO) Take 800 mg by  mouth daily.      . clonazePAM (KLONOPIN) 1 MG tablet Take 2 mg by mouth at bedtime.       . Coenzyme Q10 120 MG CAPS Take 120 mg by mouth daily.      . furosemide (LASIX) 40 MG tablet Take 1 tablet (40 mg total) by mouth daily.  90 tablet  1  . gabapentin (NEURONTIN) 300 MG capsule Take 1 capsule (300 mg total) by mouth 3 (three) times daily.  90 capsule  3  . hydrochlorothiazide 25 MG tablet Take 25 mg by mouth every morning.       . hyoscyamine (LEVBID) 0.375 MG 12 hr tablet Take 1 tablet (0.375 mg total) by mouth every 12 (twelve) hours as needed for cramping.  60 tablet  0  . insulin NPH (HUMULIN N,NOVOLIN N) 100 UNIT/ML injection 40 units each morning, and 35 units each evening.      . Lactobacillus (ULTIMATE PROBIOTIC FORMULA PO) Take 1 capsule by mouth every evening.       . lamoTRIgine (LAMICTAL) 200 MG tablet Take 200 mg by mouth daily at 12 noon.       . Magnesium 200 MG TABS Take 200 mg by mouth daily.      . Manganese Gluconate 50 MG TABS Take 50 mg by mouth every evening.       . meloxicam (MOBIC) 15 MG tablet Take 15 mg by mouth daily.       . Multiple Vitamin (MULTIVITAMIN WITH MINERALS) TABS Take 1 tablet by mouth every evening.      Marland Kitchen omeprazole (PRILOSEC) 20 MG capsule TAKE 1 CAPSULE TWICE DAILY  180 capsule  1  . ondansetron (ZOFRAN) 4 MG tablet Take 1 tablet (4 mg total) by mouth every 8 (eight) hours as needed for nausea.  20 tablet  0  . oxybutynin (DITROPAN-XL) 10 MG 24 hr tablet Take 10 mg by mouth every evening.       . potassium citrate (UROCIT-K 10) 10 MEQ (1080 MG) SR tablet Take 20 mEq by mouth 2 (two) times daily.       . prazosin (MINIPRESS) 5 MG capsule Take 10 mg by mouth every evening.      Marland Kitchen PREMARIN 0.3 MG tablet TAKE 1 TABLET DAILY  90 tablet  1  . propranolol (INDERAL) 40 MG tablet  Take 1 tablet (40 mg total) by mouth 2 (two) times daily.  60 tablet  11  . sertraline (ZOLOFT) 100 MG tablet Take 100 mg by mouth daily. Take 200 mg once a day.      Marland Kitchen  SYNTHROID 50 MCG tablet TAKE 1 TABLET DAILY  90 tablet  1  . Thiamine HCl (VITAMIN B-1) 100 MG tablet Take 100 mg by mouth every evening.        No current facility-administered medications on file prior to visit.   No Known Allergies  Family History  Problem Relation Age of Onset  . Hypertension Mother   . Diabetes Father   . Hypertension Father   . Hyperlipidemia Father   . Heart attack Other   . Coronary artery disease Other    BP 126/64  Pulse 60  Temp(Src) 97 F (36.1 C) (Oral)  Ht 5\' 3"  (1.6 m)  Wt 264 lb (119.75 kg)  BMI 46.78 kg/m2  SpO2 97%  Review of Systems Denies LOC.  She has gained a few lbs    Objective:   Physical Exam VITAL SIGNS:  See vs page GENERAL: no distress SKIN:  Insulin injection sites at the anterior abdomen are normal, except for a few ecchymoses.    Lab Results  Component Value Date   HGBA1C 7.6* 03/19/2013      Assessment & Plan:  DM: This insulin regimen was chosen from multiple options, for its simplicity.  The benefits of glycemic control must be weighed against the risks of hypoglycemia.  this is the best control this pt should aim for, given this regimen, which does match insulin to her changing needs throughout the day bipolar disorder: this complicates the rx of DM. Fibromyalgia: this limits exercise of DM.  Weight gain: this complicates the rx of DM.

## 2013-03-19 NOTE — Patient Instructions (Addendum)
A diabetes blood test is requested for you today.  We'll contact you with results.  Please reduce the insulin, as below.   Please come back for a follow-up appointment in 3 months.   on this type of insulin, you should eat lunch no later than 4 hrs after breakfast.  Otherwise, your blood sugar could go low.   check your blood sugar twice a day.  vary the time of day when you check, between before the 3 meals, and at bedtime.  also check if you have symptoms of your blood sugar being too high or too low.  please keep a record of the readings and bring it to your next appointment here.  please call us sooner if your blood sugar goes below 70, or if it stays over 200.

## 2013-03-20 LAB — VITAMIN D 25 HYDROXY (VIT D DEFICIENCY, FRACTURES): Vit D, 25-Hydroxy: 46 ng/mL (ref 30–89)

## 2013-03-27 ENCOUNTER — Other Ambulatory Visit: Payer: Self-pay | Admitting: Internal Medicine

## 2013-03-28 MED ORDER — ESTROGENS CONJUGATED 0.3 MG PO TABS: ORAL_TABLET | ORAL | Status: DC

## 2013-03-28 MED ORDER — LEVOTHYROXINE SODIUM 50 MCG PO TABS: ORAL_TABLET | ORAL | Status: DC

## 2013-03-28 NOTE — Telephone Encounter (Signed)
VAL pt  

## 2013-03-28 NOTE — Addendum Note (Signed)
Addended by: Deatra James on: 03/28/2013 11:04 AM   Modules accepted: Orders

## 2013-04-12 ENCOUNTER — Encounter: Payer: Self-pay | Admitting: Internal Medicine

## 2013-04-12 ENCOUNTER — Ambulatory Visit (INDEPENDENT_AMBULATORY_CARE_PROVIDER_SITE_OTHER): Payer: Medicare Other | Admitting: Internal Medicine

## 2013-04-12 VITALS — BP 122/78 | HR 57 | Temp 97.5°F | Wt 265.0 lb

## 2013-04-12 DIAGNOSIS — E1065 Type 1 diabetes mellitus with hyperglycemia: Secondary | ICD-10-CM

## 2013-04-12 DIAGNOSIS — E7800 Pure hypercholesterolemia, unspecified: Secondary | ICD-10-CM

## 2013-04-12 DIAGNOSIS — E1049 Type 1 diabetes mellitus with other diabetic neurological complication: Secondary | ICD-10-CM

## 2013-04-12 DIAGNOSIS — M25579 Pain in unspecified ankle and joints of unspecified foot: Secondary | ICD-10-CM

## 2013-04-12 DIAGNOSIS — M25572 Pain in left ankle and joints of left foot: Secondary | ICD-10-CM

## 2013-04-12 DIAGNOSIS — G43909 Migraine, unspecified, not intractable, without status migrainosus: Secondary | ICD-10-CM

## 2013-04-12 MED ORDER — ATORVASTATIN CALCIUM 10 MG PO TABS: 10.0000 mg | ORAL_TABLET | Freq: Every day | ORAL | Status: DC

## 2013-04-12 MED ORDER — INSULIN NPH (HUMAN) (ISOPHANE) 100 UNIT/ML ~~LOC~~ SUSP: 35.0000 [IU] | Freq: Two times a day (BID) | SUBCUTANEOUS | Status: DC

## 2013-04-12 MED ORDER — ESTRADIOL 0.5 MG PO TABS: 0.5000 mg | ORAL_TABLET | Freq: Every day | ORAL | Status: DC

## 2013-04-12 NOTE — Assessment & Plan Note (Signed)
Follows with podiatry, optho and endo for same -  on gabapentin since 12/2010 in addition to elavil  For neuropathy- continue same On ASA and statin meds managed by endo - 70/30 only, metformin stopped 12/2011 Because of reported hypoglycemia, reduce NPH to 35 units twice a day Check a1c q3-48mo, microalb, lipids and titrate tx as needed  Lab Results  Component Value Date   HGBA1C 7.6* 03/19/2013

## 2013-04-12 NOTE — Progress Notes (Signed)
Pre-visit discussion using our clinic review tool. No additional management support is needed unless otherwise documented below in the visit note.  

## 2013-04-12 NOTE — Patient Instructions (Addendum)
It was good to see you today.  We have reviewed your prior records including labs and tests today  Medications reviewed and updated - Refill on medication(s) as discussed today.  Reduce NPH to 35 units twice daily and exchange estradiol for Premarin  Your prescription(s) have been submitted to your mail order pharmacy. Please take as directed and contact our office if you believe you are having problem(s) with the medication(s).  Will make appointment with Dr. Tamala Julian to evaluate your left ankle pain  Continue working with your other specialists as reviewed  Please schedule followup in 4 months, call sooner if problems.  Diabetes and Standards of Medical Care  Diabetes is complicated. You may find that your diabetes team includes a dietitian, nurse, diabetes educator, eye doctor, and more. To help everyone know what is going on and to help you get the care you deserve, the following schedule of care was developed to help keep you on track. Below are the tests, exams, vaccines, medicines, education, and plans you will need. HbA1c test This test shows how well you have controlled your glucose over the past 2 3 months. It is used to see if your diabetes management plan needs to be adjusted.   It is performed at least 2 times a year if you are meeting treatment goals.  It is performed 4 times a year if therapy has changed or if you are not meeting treatment goals. Blood pressure test  This test is performed at every routine medical visit. The goal is less than 140/90 mmHg for most people, but 130/80 mmHg in some cases. Ask your health care provider about your goal. Dental exam  Follow up with the dentist regularly. Eye exam  If you are diagnosed with type 1 diabetes as a child, get an exam upon reaching the age of 8 years or older and have had diabetes for 3 5 years. Yearly eye exams are recommended after that initial eye exam.  If you are diagnosed with type 1 diabetes as an adult, get  an exam within 5 years of diagnosis and then yearly.  If you are diagnosed with type 2 diabetes, get an exam as soon as possible after the diagnosis and then yearly. Foot care exam  Visual foot exams are performed at every routine medical visit. The exams check for cuts, injuries, or other problems with the feet.  A comprehensive foot exam should be done yearly. This includes visual inspection as well as assessing foot pulses and testing for loss of sensation.  Check your feet nightly for cuts, injuries, or other problems with your feet. Tell your health care provider if anything is not healing. Kidney function test (urine microalbumin)  This test is performed once a year.  Type 1 diabetes: The first test is performed 5 years after diagnosis.  Type 2 diabetes: The first test is performed at the time of diagnosis.  A serum creatinine and estimated glomerular filtration rate (eGFR) test is done once a year to assess the level of chronic kidney disease (CKD), if present. Lipid profile (cholesterol, HDL, LDL, triglycerides)  Performed every 5 years for most people.  The goal for LDL is less than 100 mg/dL. If you are at high risk, the goal is less than 70 mg/dL.  The goal for HDL is 40 mg/dL 50 mg/dL for men and 50 mg/dL 60 mg/dL for women. An HDL cholesterol of 60 mg/dL or higher gives some protection against heart disease.  The goal for triglycerides is  less than 150 mg/dL. Influenza vaccine, pneumococcal vaccine, and hepatitis B vaccine  The influenza vaccine is recommended yearly.  The pneumococcal vaccine is generally given once in a lifetime. However, there are some instances when another vaccination is recommended. Check with your health care provider.  The hepatitis B vaccine is also recommended for adults with diabetes. Diabetes self-management education  Education is recommended at diagnosis and ongoing as needed. Treatment plan  Your treatment plan is reviewed at every  medical visit. Document Released: 01/17/2009 Document Revised: 11/22/2012 Document Reviewed: 08/22/2012 [facility] Patient Information 2014 [place].

## 2013-04-12 NOTE — Progress Notes (Signed)
   Subjective:    Patient ID: Brittany Acosta, female    DOB: 1950/11/24, 63 y.o.   MRN: [id number]  HPI  Here for followup. Reviewed chronic medical issues interval medical event  Reports concerns about medications and need for refill Hypoglycemia, especially evenings before dinner -60s requiring glucose tabs Also reports left ankle pain for 3 days  Past Medical History  Diagnosis Date  . GOITER, MULTINODULAR   . HYPOTHYROIDISM   . HYPERCHOLESTEROLEMIA   . Morbid obesity   . BIPOLAR DISORDER UNSPECIFIED     depression  . Chronic diastolic heart failure   . ALLERGIC RHINITIS   . FIBROMYALGIA   . GERD (gastroesophageal reflux disease)   . History of wrist fracture     rt wrist  . Depression   . Type II diabetes mellitus   . Neuropathy due to secondary diabetes   . Iron deficiency anemia   . Kidney stones     sees urologist @ [place]  . History of gout     "haven't had it in several years" (02/08/2012)  . PTSD (post-traumatic stress disorder)     Review of Systems  Constitutional: Positive for fatigue and unexpected weight change. Negative for fever.  Respiratory: Negative for cough and shortness of breath.   Cardiovascular: Negative for chest pain and leg swelling.       Objective:   Physical Exam BP 122/78  Pulse 57  Temp(Src) 97.5 F (36.4 C) (Oral)  Wt 265 lb (120.203 kg)  SpO2 97% Wt Readings from Last 3 Encounters:  04/12/13 265 lb (120.203 kg)  03/19/13 264 lb (119.75 kg)  01/24/13 257 lb 1.9 oz (116.629 kg)   Constitutional: She is obese, but appears well-developed and well-nourished. No distress.  Neck: Thick -Normal range of motion. Neck supple. No JVD present. No thyromegaly present.  Cardiovascular: Normal rate, regular rhythm and normal heart sounds.  No murmur heard. No BLE edema. Pulmonary/Chest: Effort normal and breath sounds normal. No respiratory distress. She has no wheezes.  Musculoskeletal: left ankle with mild soft tissue swelling, Normal  range of motion, no joint effusions. No gross deformities Psychiatric: She has a monotone but dysphoric/flat mood and affect. Her behavior is normal. Judgment and thought content normal.   Lab Results  Component Value Date   WBC 12.0* 12/26/2012   HGB 12.9 12/26/2012   HCT 38.8 12/26/2012   PLT 306.0 12/26/2012   GLUCOSE 182* 12/26/2012   CHOL 116 12/06/2012   TRIG 130.0 12/06/2012   HDL 53.20 12/06/2012   LDLDIRECT 56.4 06/11/2011   LDLCALC 37 12/06/2012   ALT 29 12/26/2012   AST 28 12/26/2012   NA 137 12/26/2012   K 3.6 12/26/2012   CL 98 12/26/2012   CREATININE 0.8 12/26/2012   BUN 11 12/26/2012   CO2 31 12/26/2012   TSH 2.93 12/12/2012   INR 1.01 02/04/2012   HGBA1C 7.6* 03/19/2013   MICROALBUR 0.4 12/06/2012       Assessment & Plan:   left ankle pain x3 days. Mild soft tissue swelling, but denies injury or sprain. No problem weightbearing but constant ache unrelieved with Tylenol. Refer to sports medicine for further evaluation and treatment of same  Also see problem list. Medications reviewed and updated

## 2013-04-12 NOTE — Assessment & Plan Note (Signed)
Goal LDL under 70 Will reduce statin to avoid over treatment -  Change atorvastatin from 20 mg to 10 mg daily -erx done

## 2013-04-12 NOTE — Assessment & Plan Note (Signed)
Increase in headache symptoms since March 2014 medication changes when psychiatry stopped Remeron and Topamax Neuro exam remains benign  Evaluation by neurology summer 2014 reviewed -on Inderal for prophylaxis in place of Topamax MRI head unremarkable December 2014 Continue Axert or formulary equivalent as needed

## 2013-04-13 ENCOUNTER — Ambulatory Visit: Payer: Medicare Other | Admitting: Internal Medicine

## 2013-04-17 ENCOUNTER — Ambulatory Visit: Payer: Medicare Other | Admitting: Family Medicine

## 2013-04-23 ENCOUNTER — Ambulatory Visit (INDEPENDENT_AMBULATORY_CARE_PROVIDER_SITE_OTHER): Payer: Medicare Other | Admitting: Podiatry

## 2013-04-23 ENCOUNTER — Encounter: Payer: Self-pay | Admitting: Podiatry

## 2013-04-23 ENCOUNTER — Ambulatory Visit (INDEPENDENT_AMBULATORY_CARE_PROVIDER_SITE_OTHER): Payer: Medicare Other

## 2013-04-23 VITALS — BP 119/61 | HR 79 | Temp 96.4°F | Resp 18

## 2013-04-23 DIAGNOSIS — S91309A Unspecified open wound, unspecified foot, initial encounter: Secondary | ICD-10-CM

## 2013-04-23 DIAGNOSIS — M79609 Pain in unspecified limb: Secondary | ICD-10-CM

## 2013-04-23 DIAGNOSIS — S91319A Laceration without foreign body, unspecified foot, initial encounter: Secondary | ICD-10-CM

## 2013-04-23 DIAGNOSIS — S92919A Unspecified fracture of unspecified toe(s), initial encounter for closed fracture: Secondary | ICD-10-CM

## 2013-04-23 MED ORDER — CEPHALEXIN 500 MG PO CAPS
500.0000 mg | ORAL_CAPSULE | Freq: Four times a day (QID) | ORAL | Status: DC
Start: 2013-04-23 — End: 2013-04-23

## 2013-04-23 MED ORDER — CEPHALEXIN 500 MG PO CAPS: 500.0000 mg | ORAL_CAPSULE | Freq: Four times a day (QID) | ORAL | Status: DC

## 2013-04-23 NOTE — Progress Notes (Signed)
° °  Subjective:    Patient ID: Brittany Acosta, female    DOB: [birth year], 63 y.o.   MRN: [id number]  HPI I hit my right foot on a tool box this morning and throbbing and no numbness or tingling and can't tell if it is swelling or not .  This patient describes injuring her right foot on a plastic stepstool today. She noticed immediate bleeding from the area. Her husband applied a gauze dressing and presents today for evaluation.   Review of Systems  Constitutional: Positive for chills.  HENT: Negative.   Eyes: Negative.   Respiratory: Negative.   Cardiovascular: Negative.   Gastrointestinal: Negative.   Endocrine: Positive for cold intolerance and heat intolerance.  Genitourinary: Negative.   Musculoskeletal: Negative.   Skin: Negative.   Allergic/Immunologic: Negative.   Neurological: Negative.   Hematological: Bruises/bleeds easily.  Psychiatric/Behavioral: Negative.        Objective:   Physical Exam  Orientated x3 white female presents with her husband  Vascular: DP pulses two over four bilaterally. PT pulses one over 4 bilaterally.  Neurological: Deferred  Dermatological: A laceration in the fourth right with space with some residual bleeding from the area noted. There is no no erythema, edema noted in the fourth fifth right toe area.  Musculoskeletal: No deformities noted bilaterally   X-ray report right foot  Transverse fracture of the proximal phalanx fifth digit without displacement. Soft tissue deficit noted between fourth and fifth toes with slight lateral drifting of the fifth digit.  Radiographic impression:  Nondisplaced fracture proximal phalanx fifth toe  Soft tissue void between fourth and fifth toes noted.       Assessment & Plan:   Assessment: Transverse fracture proximal phalanx fifth digit right without displacement Laceration fourth right with space  Plan: The wound site between the fourth and fifth right toe was washed and packed with Iodosorb  gel. A gauze dressing was applied. A surgical shoe was dispensed to wear and right foot Cephalexin 500 mg 4 times a day x7 days prescribed.  The patient will continue  local wound care at home with Betadine soaks and applying Betadine ointment to the fourth webspace area on a daily basis, cover with  gauze and continue wearing surgical shoe. Patient advised if she develops any sudden swelling, warmth or fever to present to the ER.   Reevaluate in 7 days.

## 2013-04-23 NOTE — Patient Instructions (Signed)
Betadine Soak Instructions  Purchase an 8 oz. bottle of BETADINE solution (Povidone)  THE DAY AFTER THE PROCEDURE  Place 1 tablespoon of betadine solution in a quart of warm tap water.  Submerge your foot or feet with outer bandage intact for the initial soak; this will allow the bandage to become moist and wet for easy lift off.  Once you remove your bandage, continue to soak in the solution for 20 minutes.  This soak should be done twice a day.  Next, remove your foot or feet from solution, blot dry the affected area and cover.  You may use a band aid large enough to cover the area or use gauze and tape.  Apply other medications to the area as directed by the doctor such as cortisporin otic solution (ear drops) or neosporin.  Apply Betadine ointment to the wound site on the fourth/fifth toe area right foot daily  Wear a surgical shoe on right foot.  Stretch her antibiotics today.  IF YOUR SKIN BECOMES IRRITATED WHILE USING THESE INSTRUCTIONS, IT IS OKAY TO SWITCH TO EPSOM SALTS AND WATER OR WHITE VINEGAR AND WATER.

## 2013-04-24 ENCOUNTER — Encounter: Payer: Self-pay | Admitting: Podiatry

## 2013-04-26 ENCOUNTER — Other Ambulatory Visit: Payer: Self-pay | Admitting: Internal Medicine

## 2013-04-30 ENCOUNTER — Ambulatory Visit (INDEPENDENT_AMBULATORY_CARE_PROVIDER_SITE_OTHER): Payer: Medicare Other | Admitting: *Deleted

## 2013-04-30 ENCOUNTER — Ambulatory Visit (INDEPENDENT_AMBULATORY_CARE_PROVIDER_SITE_OTHER): Payer: Medicare Other | Admitting: Podiatry

## 2013-04-30 ENCOUNTER — Encounter: Payer: Self-pay | Admitting: Podiatry

## 2013-04-30 VITALS — BP 152/73 | HR 57 | Temp 96.8°F | Resp 18

## 2013-04-30 DIAGNOSIS — S92919A Unspecified fracture of unspecified toe(s), initial encounter for closed fracture: Secondary | ICD-10-CM

## 2013-04-30 DIAGNOSIS — S91319A Laceration without foreign body, unspecified foot, initial encounter: Secondary | ICD-10-CM

## 2013-04-30 DIAGNOSIS — S91309A Unspecified open wound, unspecified foot, initial encounter: Secondary | ICD-10-CM

## 2013-04-30 DIAGNOSIS — Z23 Encounter for immunization: Secondary | ICD-10-CM

## 2013-04-30 NOTE — Patient Instructions (Signed)
Complete previous prescription for antibiotics. Maintain daily Betadine soaks. Apply a small amount of antibiotic ointment to the fourth right web space daily, insert a folded piece of gauze between the fourth and fifth toes on the right foot and secure with Coflex tape. Maintain surgical shoe on the right foot and limited amount of standing and walking.

## 2013-04-30 NOTE — Progress Notes (Signed)
   Subjective:    Patient ID: Brittany Acosta, female    DOB: [birth year], 63 y.o.   MRN: [id number]  HPI It feels so much better on my fifth toe on my right foot  This patient presents for followup care after the visit of 04/24/2013 for a laceration and fracture in the right foot. She is currently taking cephalexin without a complaint. .   Review of Systems     Objective:   Physical Exam Orientated x35 63 year old white female presents with her husband. Oral temperature 96.8  Dermatological: Low-grade edema in the fifth right toe with slight maceration in the fourth right with space and beginning closure of the laceration in the fourth right with space. No erythema, drainage or malodor is noted around the fifth toe area on right foot the        Assessment & Plan:   Assessment: Healing laceration fourth right with space. No active sign of clinical infection at this time.  Plan: Complete previous Rx for cephalexin. Continue Betadine soaks and local antibiotic to the fourth right with space with light gauze dressing. Ambulate in a surgical shoe and reduce standing and walking.  Reappoint x2 weeks

## 2013-05-14 ENCOUNTER — Ambulatory Visit (INDEPENDENT_AMBULATORY_CARE_PROVIDER_SITE_OTHER): Payer: Medicare Other | Admitting: Endocrinology

## 2013-05-14 ENCOUNTER — Ambulatory Visit: Payer: Medicare Other | Admitting: Podiatry

## 2013-05-14 ENCOUNTER — Encounter: Payer: Self-pay | Admitting: Endocrinology

## 2013-05-14 VITALS — BP 122/86 | HR 67 | Temp 97.8°F | Ht 63.0 in | Wt 265.0 lb

## 2013-05-14 DIAGNOSIS — E1049 Type 1 diabetes mellitus with other diabetic neurological complication: Secondary | ICD-10-CM

## 2013-05-14 DIAGNOSIS — E1065 Type 1 diabetes mellitus with hyperglycemia: Secondary | ICD-10-CM

## 2013-05-14 NOTE — Patient Instructions (Signed)
Please reduce the insulin to 35 units each morning, and 30 units each evening.   Please come back for a follow-up appointment in 2 months.  on this type of insulin, you should eat lunch no later than 4 hrs after breakfast.  Otherwise, your blood sugar could go low.   check your blood sugar twice a day.  vary the time of day when you check, between before the 3 meals, and at bedtime.  also check if you have symptoms of your blood sugar being too high or too low.  please keep a record of the readings and bring it to your next appointment here.  please call us sooner if your blood sugar goes below 70, or if it stays over 200.

## 2013-05-14 NOTE — Progress Notes (Signed)
Subjective:    Patient ID: Brittany Acosta, female    DOB: Jul 25, 1950, 63 y.o.   MRN: [id number]  HPI Pt returns for f/u of insulin-requiring DM (dx'ed 2002, on a routine blood test; she has moderate neuropathy of the lower extremities; no associated chronic complications; she has been on insulin since 2004; in 2012, she was changed to a simple BID insulin regimen, after she did not get good control on multiple daily injections; she says she can only afford inexpensive human insulin; she has never had severe hypoglycemia or DKA).  she brings a record of her cbg's which i have reviewed today.  It varies from 65-200, but most are below 100.  This is despite the insulin being decreased to 35 units BID.  It is in general higher as the day goes on, but not necessarily so.   Past Medical History  Diagnosis Date  . GOITER, MULTINODULAR   . HYPOTHYROIDISM   . HYPERCHOLESTEROLEMIA   . Morbid obesity   . BIPOLAR DISORDER UNSPECIFIED     depression  . Chronic diastolic heart failure   . ALLERGIC RHINITIS   . FIBROMYALGIA   . GERD (gastroesophageal reflux disease)   . History of wrist fracture     rt wrist  . Depression   . Type II diabetes mellitus   . Neuropathy due to secondary diabetes   . Iron deficiency anemia   . Kidney stones     sees urologist @ [facility]  . History of gout     "haven't had it in several years" (02/08/2012)  . PTSD (post-traumatic stress disorder)     Past Surgical History  Procedure Laterality Date  . Cholecystectomy  1985  . Abdominal hysterectomy  1976  . Tubal ligation  1972  . Right nasal surgery  08/1988  . Right sinus removed  08/1989    tooth partial  . Percitania stone removed (l) kidney  1992  . Lithotripsy (l) kidney  1997  . Left cystoscopy   1990  . Thyroidectomy  04/22/2011    Procedure: THYROIDECTOMY;  Surgeon: Onnie Graham, MD;  Location: [facility];  Service: ENT;  Laterality: N/A;  TOTAL THYROIDECOTMY  . Cataract extraction, bilateral  02/2011    epps    . Total knee arthroplasty  06/18/2011    Procedure: TOTAL KNEE ARTHROPLASTY;  Surgeon: Kerin Salen, MD;  Location: [facility];  Service: Orthopedics;  Laterality: Left;  DEPUY  . Total knee arthroplasty  02/07/2012    Procedure: TOTAL KNEE ARTHROPLASTY;  Surgeon: Kerin Salen, MD;  Location: [facility];  Service: Orthopedics;  Laterality: Right;  . Incisional breast biopsy  2000    right  . Cardiac catheterization  2001    sees Dr Peter Martinique  . Knee arthroscopy  08/2009    right  . Knee arthroscopy with medial menisectomy      left  . Excisional hemorrhoidectomy      "dr cut out in his office" (02/08/2012)  . Left oophorectomy  1980  . Rotator cuff repair  2013    right shoulder x 2  . Joint replacement      rt knee  . Partial mastectomy with needle localization Left 09/01/2012    Procedure: PARTIAL MASTECTOMY WITH NEEDLE LOCALIZATION;  Surgeon: Adin Hector, MD;  Location: [facility];  Service: General;  Laterality: Left;  . Cataract extraction  2014    History   Social History  . Marital Status: Married  Spouse Name: N/A    Number of Children: 2  . Years of Education: N/A   Occupational History  . disability    Social History Main Topics  . Smoking status: Former Smoker -- 1.00 packs/day for 6 years    Types: Cigarettes    Quit date: 04/06/1983  . Smokeless tobacco: Never Used     Comment: Married, lives with spouse. Disable- 2 grown kids-6 g-kids  . Alcohol Use: No     Comment: none  . Drug Use: No  . Sexual Activity: Not Currently   Other Topics Concern  . Not on file   Social History Narrative   Married, lives with spouse - 2 adult children   diabled    Current Outpatient Prescriptions on File Prior to Visit  Medication Sig Dispense Refill  . Alpha-Lipoic Acid 300 MG TABS Take 300 mg by mouth daily.       . ARIPiprazole (ABILIFY) 20 MG tablet Take 10 mg by mouth at bedtime.       Marland Kitchen aspirin 81 MG tablet Take 81 mg by mouth daily.      Marland Kitchen atorvastatin (LIPITOR)  10 MG tablet Take 1 tablet (10 mg total) by mouth daily.  90 tablet  3  . cephALEXin (KEFLEX) 500 MG capsule Take 1 capsule (500 mg total) by mouth 4 (four) times daily.  28 capsule  1  . Cholecalciferol (VITAMIN D-3 PO) Take 800 mg by mouth daily.      . clonazePAM (KLONOPIN) 1 MG tablet Take 3 mg by mouth at bedtime.       . Coenzyme Q10 120 MG CAPS Take 120 mg by mouth daily.      Marland Kitchen estradiol (ESTRACE) 0.5 MG tablet Take 1 tablet (0.5 mg total) by mouth daily.  90 tablet  1  . furosemide (LASIX) 40 MG tablet Take 1 tablet (40 mg total) by mouth daily.  90 tablet  1  . gabapentin (NEURONTIN) 300 MG capsule TAKE 1 CAPSULE (300 MG TOTAL) BY MOUTH 3 (THREE) TIMES DAILY.  90 capsule  5  . hydrochlorothiazide 25 MG tablet Take 25 mg by mouth every morning.       . Lactobacillus (ULTIMATE PROBIOTIC FORMULA PO) Take 1 capsule by mouth every evening.       . lamoTRIgine (LAMICTAL) 200 MG tablet Take 200 mg by mouth daily at 12 noon.       Marland Kitchen levothyroxine (SYNTHROID) 50 MCG tablet TAKE 1 TABLET DAILY  90 tablet  1  . Magnesium 200 MG TABS Take 200 mg by mouth daily.      . Manganese Gluconate 50 MG TABS Take 50 mg by mouth every evening.       Marland Kitchen MANGANESE PO Take 40 mg by mouth daily.      . meloxicam (MOBIC) 15 MG tablet Take 15 mg by mouth daily.       . Multiple Vitamin (MULTIVITAMIN WITH MINERALS) TABS Take 1 tablet by mouth every evening.      Marland Kitchen omeprazole (PRILOSEC) 20 MG capsule TAKE 1 CAPSULE TWICE DAILY  180 capsule  1  . oxybutynin (DITROPAN-XL) 10 MG 24 hr tablet Take 10 mg by mouth every evening.       . potassium citrate (UROCIT-K 10) 10 MEQ (1080 MG) SR tablet Take 20 mEq by mouth 2 (two) times daily.       . prazosin (MINIPRESS) 5 MG capsule Take 10 mg by mouth every evening.      Marland Kitchen  propranolol (INDERAL) 40 MG tablet Take 1 tablet (40 mg total) by mouth 2 (two) times daily.  60 tablet  11  . sertraline (ZOLOFT) 100 MG tablet Take 300 mg by mouth daily. Take 200 mg once a day.      .  Thiamine HCl (VITAMIN B-1) 100 MG tablet Take 100 mg by mouth every evening.        No current facility-administered medications on file prior to visit.    No Known Allergies  Family History  Problem Relation Age of Onset  . Hypertension Mother   . Diabetes Father   . Hypertension Father   . Hyperlipidemia Father   . Heart attack Other   . Coronary artery disease Other     BP 122/86  Pulse 67  Temp(Src) 97.8 F (36.6 C) (Oral)  Ht 5\' 3"  (1.6 m)  Wt 265 lb (120.203 kg)  BMI 46.95 kg/m2  SpO2 96%  Review of Systems Denies LOC.  She has gained weight.      Objective:   Physical Exam VITAL SIGNS:  See vs page. GENERAL: no distress. SKIN:  Insulin injection sites at the anterior abdomen are normal.   Lab Results  Component Value Date   HGBA1C 7.6* 03/19/2013      Assessment & Plan:  DM: This insulin regimen was chosen from multiple options, for its simplicity.  The benefits of glycemic control must be weighed against the risks of hypoglycemia.  overcontrolled, given this regimen, which does match insulin to her changing needs throughout the day bipolar disorder: this complicates the rx of DM. Fibromyalgia: this limits exercise of DM.  Weight gain: this complicates the rx of DM.

## 2013-05-21 ENCOUNTER — Ambulatory Visit: Payer: Medicare Other | Admitting: Podiatry

## 2013-05-23 ENCOUNTER — Ambulatory Visit (INDEPENDENT_AMBULATORY_CARE_PROVIDER_SITE_OTHER): Payer: Medicare Other | Admitting: Podiatry

## 2013-05-23 ENCOUNTER — Encounter: Payer: Self-pay | Admitting: Podiatry

## 2013-05-23 ENCOUNTER — Ambulatory Visit (INDEPENDENT_AMBULATORY_CARE_PROVIDER_SITE_OTHER): Payer: Medicare Other

## 2013-05-23 VITALS — BP 141/64 | HR 62 | Resp 18

## 2013-05-23 DIAGNOSIS — S92919A Unspecified fracture of unspecified toe(s), initial encounter for closed fracture: Secondary | ICD-10-CM

## 2013-05-23 DIAGNOSIS — S91319A Laceration without foreign body, unspecified foot, initial encounter: Secondary | ICD-10-CM

## 2013-05-23 DIAGNOSIS — S91309A Unspecified open wound, unspecified foot, initial encounter: Secondary | ICD-10-CM

## 2013-05-23 NOTE — Patient Instructions (Signed)
Wear the surgical shoe on the right foot an additional 2 weeks. Began transitioning into a soft shoe on the right foot as long as the foot is comfortable. Return to the surgical shoe as needed. Discontinue local wound to the right foot.

## 2013-05-24 ENCOUNTER — Encounter: Payer: Self-pay | Admitting: Podiatry

## 2013-05-24 NOTE — Progress Notes (Signed)
Patient ID: FATIMATA TALSMA, female   DOB: [birth year], 63 y.o.   MRN: [id number]  Subjective: This patient presents for followup care for laceration and fracture fifth toe right foot, initial visit 04/23/2013.  Objective: Slight edema noted in the fifth right toe with the toe in a rectus position. The laceration the fourth right with space is closed. There is mild palpable tenderness in the base of fifth right metatarsal.   X-ray examination left foot  Very slight visibility of the transverse fracture on the medial aspect of the base of the proximal phalanx fifth toe without displacement.  Radiographic impression: Healing transverse fracture base of proximal phalanx fifth toe, left foot  Assessment: Healed laceration fourth right web space without any clinical sign of infection Healing transverse fracture base of proximal phalanx fifth right toe  Plan: DC local wound care Continue to wear her surgical shoe on right foot until fifth toe is pain-free.  Patient is discharged

## 2013-06-12 ENCOUNTER — Other Ambulatory Visit: Payer: Self-pay | Admitting: Endocrinology

## 2013-06-25 ENCOUNTER — Ambulatory Visit: Payer: Medicare Other

## 2013-06-25 VITALS — BP 142/66 | HR 76 | Resp 12

## 2013-06-25 DIAGNOSIS — M79609 Pain in unspecified limb: Secondary | ICD-10-CM

## 2013-06-25 DIAGNOSIS — R609 Edema, unspecified: Secondary | ICD-10-CM

## 2013-06-25 DIAGNOSIS — M1990 Unspecified osteoarthritis, unspecified site: Secondary | ICD-10-CM

## 2013-06-25 DIAGNOSIS — M2030 Hallux varus (acquired), unspecified foot: Secondary | ICD-10-CM

## 2013-06-25 DIAGNOSIS — E1140 Type 2 diabetes mellitus with diabetic neuropathy, unspecified: Secondary | ICD-10-CM

## 2013-06-25 NOTE — Patient Instructions (Signed)
Diabetes and Foot Care Diabetes may cause you to have problems because of poor blood supply (circulation) to your feet and legs. This may cause the skin on your feet to become thinner, break easier, and heal more slowly. Your skin may become dry, and the skin may peel and crack. You may also have nerve damage in your legs and feet causing decreased feeling in them. You may not notice minor injuries to your feet that could lead to infections or more serious problems. Taking care of your feet is one of the most important things you can do for yourself.  HOME CARE INSTRUCTIONS  Wear shoes at all times, even in the house. Do not go barefoot. Bare feet are easily injured.  Check your feet daily for blisters, cuts, and redness. If you cannot see the bottom of your feet, use a mirror or ask someone for help.  Wash your feet with warm water (do not use hot water) and mild soap. Then pat your feet and the areas between your toes until they are completely dry. Do not soak your feet as this can dry your skin.  Apply a moisturizing lotion or petroleum jelly (that does not contain alcohol and is unscented) to the skin on your feet and to dry, brittle toenails. Do not apply lotion between your toes.  Trim your toenails straight across. Do not dig under them or around the cuticle. File the edges of your nails with an emery board or nail file.  Do not cut corns or calluses or try to remove them with medicine.  Wear clean socks or stockings every day. Make sure they are not too tight. Do not wear knee-high stockings since they may decrease blood flow to your legs.  Wear shoes that fit properly and have enough cushioning. To break in new shoes, wear them for just a few hours a day. This prevents you from injuring your feet. Always look in your shoes before you put them on to be sure there are no objects inside.  Do not cross your legs. This may decrease the blood flow to your feet.  If you find a minor scrape,  cut, or break in the skin on your feet, keep it and the skin around it clean and dry. These areas may be cleansed with mild soap and water. Do not cleanse the area with peroxide, alcohol, or iodine.  When you remove an adhesive bandage, be sure not to damage the skin around it.  If you have a wound, look at it several times a day to make sure it is healing.  Do not use heating pads or hot water bottles. They may burn your skin. If you have lost feeling in your feet or legs, you may not know it is happening until it is too late.  Make sure your health care provider performs a complete foot exam at least annually or more often if you have foot problems. Report any cuts, sores, or bruises to your health care provider immediately. SEEK MEDICAL CARE IF:   You have an injury that is not healing.  You have cuts or breaks in the skin.  You have an ingrown nail.  You notice redness on your legs or feet.  You feel burning or tingling in your legs or feet.  You have pain or cramps in your legs and feet.  Your legs or feet are numb.  Your feet always feel cold. SEEK IMMEDIATE MEDICAL CARE IF:   There is increasing redness,   swelling, or pain in or around a wound.  There is a red line that goes up your leg.  Pus is coming from a wound.  You develop a fever or as directed by your health care provider.  You notice a bad smell coming from an ulcer or wound. Document Released: 03/19/2000 Document Revised: 11/22/2012 Document Reviewed: 08/29/2012 [facility] Patient Information 2014 [place].   Must wear a lace up or Oxford type shoe no slip on snow flip-flops no flimsy shoes no maryjane type shoes Also recommend support hose or compression stocking be worn daily place the stockings on first thing in the morning before getting out of bed and maintain them as instructed this will help with the swelling and fluid formation both feet and ankles

## 2013-06-25 NOTE — Progress Notes (Signed)
   Subjective:    Patient ID: Brittany Acosta, female    DOB: 1950-05-23, 63 y.o.   MRN: [id number]  HPI '' BOTH FEET ARE STILL SWOLLEN AND LT FOOT 5TH TOE IS DOING OK.''   Review of Systems no new changes or findings since last visit     Objective:   Physical Exam Patient presents at this time with a new problem she has a fracture the fifth toe right foot this visit still has some edema and pain tenderness and fifth toe however at this time her concern is that she's having pain in both feet swelling aching and throbbing points to the great toes left foot has a prominence dissection point the hallux IP joint dorsally slight hallux hammertoe deformity or hallux malleus with contracture and prominence of the MTP joint and IP joint no new history of injury trauma we'll do x-rays are reviewed with fracture the fifth digit proximal phalanx base being noted. Patient has +2 edema on the right plus one edema the left she does have compression stockings however has not used them pedal pulses palpable DP and PT bilateral. Epicritic and proprioceptive sensations intact although diminished on Semmes Weinstein testing to the digits and plantar foot remainder of exam otherwise unremarkable patient wearing shoes are appropriate slip on or to narrow for foot with no structure support or stability.       Assessment & Plan:  Assessment this time his diabetes with peripheral neuropathy. Patient also has venous insufficiency and edema right more so than left recommended compression stockings to be worn daily she'll he has to stockings at home. May recommendations for a lace up oxford type shoe such as a walking or running shoe or SAS or [place]'s or Birkenstock type shoes. No barefoot no flimsy shoes no flip-flops patient currently doing her own nail care or sitting manicurist advised that she needs assistance report happy to provide diabetic foot and palliative care in the future to. Reappointed for followup as needed  again stressed the importance accommodative shoes not tight or constrictive the fifth metatarsal fracture may produce some edema for leaks other 2 or 3 months however did stressed the importance of appropriate coming shoes  Harriet Masson DPM

## 2013-06-28 ENCOUNTER — Other Ambulatory Visit: Payer: Self-pay

## 2013-06-28 ENCOUNTER — Telehealth: Payer: Self-pay | Admitting: *Deleted

## 2013-06-28 DIAGNOSIS — Z1239 Encounter for other screening for malignant neoplasm of breast: Secondary | ICD-10-CM

## 2013-06-28 NOTE — Telephone Encounter (Signed)
Spoke with pt advised order placed. 

## 2013-06-28 NOTE — Telephone Encounter (Signed)
Pt called requesting a referral to the [facility] for a Mammogram.  Please advise

## 2013-06-28 NOTE — Telephone Encounter (Signed)
ordered

## 2013-07-02 ENCOUNTER — Encounter: Payer: Self-pay | Admitting: Family Medicine

## 2013-07-02 ENCOUNTER — Other Ambulatory Visit (INDEPENDENT_AMBULATORY_CARE_PROVIDER_SITE_OTHER): Payer: Medicare Other

## 2013-07-02 ENCOUNTER — Ambulatory Visit (INDEPENDENT_AMBULATORY_CARE_PROVIDER_SITE_OTHER): Payer: Medicare Other | Admitting: Family Medicine

## 2013-07-02 VITALS — BP 126/82 | HR 63 | Wt 266.0 lb

## 2013-07-02 DIAGNOSIS — M79672 Pain in left foot: Secondary | ICD-10-CM

## 2013-07-02 DIAGNOSIS — M79609 Pain in unspecified limb: Secondary | ICD-10-CM

## 2013-07-02 DIAGNOSIS — M25579 Pain in unspecified ankle and joints of unspecified foot: Secondary | ICD-10-CM | POA: Insufficient documentation

## 2013-07-02 DIAGNOSIS — M79671 Pain in right foot: Secondary | ICD-10-CM

## 2013-07-02 NOTE — Progress Notes (Signed)
[facility] [address], [place] [address] Phone: [phone] Subjective:    I'm seeing this patient by the request  of:  Gwendolyn Grant, MD   CC: foot pain bilaterally.   Brittany Acosta is a 63 y.o. female coming in with complaint of left ankle pain. Patient has been seen by different providers including a podiatrist for this problem. Patient has had some swelling and did have a fracture of the fifth metatarsal previously. Patient also has a past medical history significant for fibromyalgia and overweight and peripheral neuropathy. Patient states that she has bilateral pain of the feet. Patient states it hurts more at the end of the day especially when she has more swelling. Patient has tried and diabetic orthotics with minimal improvement as well as gotten new shoes which have been somewhat helpful.     Past medical history, social, surgical and family history all reviewed in electronic medical record.   Review of Systems: No headache, visual changes, nausea, vomiting, diarrhea, constipation, dizziness, abdominal pain, skin rash, fevers, chills, night sweats, weight loss, swollen lymph nodes, body aches, joint swelling, muscle aches, chest pain, shortness of breath, mood changes.   Objective Blood pressure 126/82, pulse 63, weight 266 lb (120.657 kg), SpO2 98.00%.  General: No apparent distress alert and oriented x3 mood and affect normal, dressed appropriately.  HEENT: Pupils equal, extraocular movements intact  Respiratory: Patient's speak in full sentences and does not appear short of breath  Cardiovascular: 2+ lower extremity edema, non tender, no erythema  Skin: Warm dry intact with no signs of infection or rash on extremities or on axial skeleton.  Abdomen: Soft nontender  Neuro: Cranial nerves II through XII are intact, neurovascularly intact in all extremities with 2+ DTRs and 2+ pulses.  Lymph: No lymphadenopathy of  posterior or anterior cervical chain or axillae bilaterally.  Gait normal with good balance and coordination.  MSK:  Non tender with full range of motion and good stability and symmetric strength and tone of shoulders, elbows, wrist, hip, knee and ankles bilaterally.  Foot Exam shows the patient does have swelling of ankles bilaterally. Patient does have overpronation hindfoot bilaterally significantly. Patient does have some breakdown of the longitudinal as well as the transverse arch causing splaying between the first and second toes bilaterally right greater than left. Patient does have hammering of the third fourth and fifth toes bilaterally. Mild tibial deviation of the large toes bilaterally. Patient does have mild bunion formations bilaterally with mild bunionette. Nontender on exam good range of motion of the ankles.  MSK US performed of: Left This study was ordered, performed, and interpreted by Charlann Boxer D.O.  Foot/Ankle:   All structures visualized.   Talar dome unremarkable  Ankle mortise without effusion. Peroneus longus and brevis tendons unremarkable on long and transverse views without sheath effusions. Patient does have surrounding soft tissue swelling Posterior tibialis, flexor hallucis longus, and flexor digitorum longus tendons unremarkable on long and transverse views without sheath effusions. Achilles tendon visualized along length of tendon and unremarkable on long and transverse views without sheath effusion. Anterior Talofibular Ligament hard to visualize secondary to soft tissue swelling surrounding the area and Calcaneofibular Ligaments unremarkable and intact. Deltoid Ligament unremarkable and intact. Plantar fascia intact and without effusion, normal thickness. No increased doppler signal, cap sign, or thickening of tibial cortex. Power doppler signal normal.  IMPRESSION:  Lateral column overload     Impression and Recommendations:  This case required  medical decision making of moderate complexity.

## 2013-07-02 NOTE — Assessment & Plan Note (Signed)
Patient's bilateral foot pain is likely multifactorial secondary to her diabetes and peripheral neuropathy, obesity, and patient's fibromyalgia. Patient does have lateral column overload of the left ankle that is causing some mild trace tarsi tunnel syndrome. Patient was told that compression could be beneficial. We discussed over-the-counter orthotics that could be helpful as well as shoes that would be more beneficial. Home exercise program to do 3 times a week and discussed icing protocol the Patient will try these interventions and come back in 3 weeks. If she continues to have trouble we may want to consider orthotics.

## 2013-07-02 NOTE — Patient Instructions (Signed)
Nice to meet you Wear shoes with rigid sole- Dansko, Marily Memos, Jennet Maduro are some that do well Spenco orthotics at [employer] or online would be good.  Ice bath 10 minutes 1 time before bed can help  Vitamin D 2000 IU daily  Turmeric 500mg  twice daily.  Controlling your weight is important.  Compression socks will help with the swelling and some of the discomfort.  Exercises 3 times a week.  Come back in 3 weeks.  If still in pain then come back and we will make orthotics.

## 2013-07-03 ENCOUNTER — Other Ambulatory Visit: Payer: Self-pay | Admitting: Internal Medicine

## 2013-07-03 DIAGNOSIS — Z9889 Other specified postprocedural states: Secondary | ICD-10-CM

## 2013-07-03 DIAGNOSIS — Z1231 Encounter for screening mammogram for malignant neoplasm of breast: Secondary | ICD-10-CM

## 2013-07-04 ENCOUNTER — Other Ambulatory Visit: Payer: Self-pay | Admitting: Endocrinology

## 2013-07-16 ENCOUNTER — Ambulatory Visit (INDEPENDENT_AMBULATORY_CARE_PROVIDER_SITE_OTHER): Payer: Medicare Other | Admitting: Endocrinology

## 2013-07-16 ENCOUNTER — Encounter: Payer: Self-pay | Admitting: Endocrinology

## 2013-07-16 ENCOUNTER — Other Ambulatory Visit: Payer: Self-pay | Admitting: Internal Medicine

## 2013-07-16 VITALS — BP 124/82 | HR 63 | Temp 97.6°F | Ht 63.0 in | Wt 266.0 lb

## 2013-07-16 DIAGNOSIS — E1049 Type 1 diabetes mellitus with other diabetic neurological complication: Secondary | ICD-10-CM

## 2013-07-16 DIAGNOSIS — E1065 Type 1 diabetes mellitus with hyperglycemia: Secondary | ICD-10-CM

## 2013-07-16 LAB — HEMOGLOBIN A1C: Hgb A1c MFr Bld: 8.1 % — ABNORMAL HIGH (ref 4.6–6.5)

## 2013-07-16 NOTE — Patient Instructions (Addendum)
Please come back for a follow-up appointment in 3 months.  on this type of insulin, you should eat lunch no later than 4 hrs after breakfast.  Otherwise, your blood sugar could go low.   check your blood sugar twice a day.  vary the time of day when you check, between before the 3 meals, and at bedtime.  also check if you have symptoms of your blood sugar being too high or too low.  please keep a record of the readings and bring it to your next appointment here.  please call us sooner if your blood sugar goes below 70, or if it stays over 200.   blood tests are being requested for you today.  We'll contact you with results.    

## 2013-07-16 NOTE — Progress Notes (Signed)
Subjective:    Patient ID: Brittany Acosta, female    DOB: May 13, 1950, 63 y.o.   MRN: [id number]  HPI Pt returns for f/u of insulin-requiring DM (dx'ed 2002, on a routine blood test; she has moderate neuropathy of the lower extremities; no associated chronic complications; she has been on insulin since 2004; in 2012, she was changed to a simple BID insulin regimen, after she did not get good control on multiple daily injections; she says she can only afford inexpensive human insulin; she has never had severe hypoglycemia or DKA; she declines weight-loss surgery).  she brings a record of her cbg's which i have reviewed today.  It is in general higher as the day goes on, but not necessarily so.  It varies from 72-200, but most are in the low-100's.   Past Medical History  Diagnosis Date  . GOITER, MULTINODULAR   . HYPOTHYROIDISM   . HYPERCHOLESTEROLEMIA   . Morbid obesity   . BIPOLAR DISORDER UNSPECIFIED     depression  . Chronic diastolic heart failure   . ALLERGIC RHINITIS   . FIBROMYALGIA   . GERD (gastroesophageal reflux disease)   . History of wrist fracture     rt wrist  . Depression   . Type II diabetes mellitus   . Neuropathy due to secondary diabetes   . Iron deficiency anemia   . Kidney stones     sees urologist @ [place]  . History of gout     "haven't had it in several years" (02/08/2012)  . PTSD (post-traumatic stress disorder)     Past Surgical History  Procedure Laterality Date  . Cholecystectomy  1985  . Abdominal hysterectomy  1976  . Tubal ligation  1972  . Right nasal surgery  08/1988  . Right sinus removed  08/1989    tooth partial  . Percitania stone removed (l) kidney  1992  . Lithotripsy (l) kidney  1997  . Left cystoscopy   1990  . Thyroidectomy  04/22/2011    Procedure: THYROIDECTOMY;  Surgeon: Onnie Graham, MD;  Location: [facility];  Service: ENT;  Laterality: N/A;  TOTAL THYROIDECOTMY  . Cataract extraction, bilateral  02/2011    epps  . Total knee  arthroplasty  06/18/2011    Procedure: TOTAL KNEE ARTHROPLASTY;  Surgeon: Kerin Salen, MD;  Location: [facility];  Service: Orthopedics;  Laterality: Left;  DEPUY  . Total knee arthroplasty  02/07/2012    Procedure: TOTAL KNEE ARTHROPLASTY;  Surgeon: Kerin Salen, MD;  Location: [facility];  Service: Orthopedics;  Laterality: Right;  . Incisional breast biopsy  2000    right  . Cardiac catheterization  2001    sees Dr Peter Martinique  . Knee arthroscopy  08/2009    right  . Knee arthroscopy with medial menisectomy      left  . Excisional hemorrhoidectomy      "dr cut out in his office" (02/08/2012)  . Left oophorectomy  1980  . Rotator cuff repair  2013    right shoulder x 2  . Joint replacement      rt knee  . Partial mastectomy with needle localization Left 09/01/2012    Procedure: PARTIAL MASTECTOMY WITH NEEDLE LOCALIZATION;  Surgeon: Adin Hector, MD;  Location: [facility];  Service: General;  Laterality: Left;  . Cataract extraction  2014    History   Social History  . Marital Status: Married    Spouse Name: N/A    Number of  Children: 2  . Years of Education: N/A   Occupational History  . disability    Social History Main Topics  . Smoking status: Former Smoker -- 1.00 packs/day for 6 years    Types: Cigarettes    Quit date: 04/06/1983  . Smokeless tobacco: Never Used     Comment: Married, lives with spouse. Disable- 2 grown kids-6 g-kids  . Alcohol Use: No     Comment: none  . Drug Use: No  . Sexual Activity: Not Currently   Other Topics Concern  . Not on file   Social History Narrative   Married, lives with spouse - 2 adult children   diabled    Current Outpatient Prescriptions on File Prior to Visit  Medication Sig Dispense Refill  . Alpha-Lipoic Acid 300 MG TABS Take 300 mg by mouth daily.       . ARIPiprazole (ABILIFY) 20 MG tablet Take 10 mg by mouth at bedtime.       Marland Kitchen aspirin 81 MG tablet Take 81 mg by mouth daily.      Marland Kitchen atorvastatin (LIPITOR) 10 MG tablet  Take 1 tablet (10 mg total) by mouth daily.  90 tablet  3  . B-D UF III MINI PEN NEEDLES 31G X 5 MM MISC USE AND DISCARD 1 PEN      NEEDLE TWO TIMES A DAY  90 each  6  . Cholecalciferol (VITAMIN D-3 PO) Take 800 mg by mouth daily.      . clonazePAM (KLONOPIN) 1 MG tablet Take 3 mg by mouth at bedtime.       . Coenzyme Q10 120 MG CAPS Take 120 mg by mouth daily.      Marland Kitchen estradiol (ESTRACE) 0.5 MG tablet Take 1 tablet (0.5 mg total) by mouth daily.  90 tablet  1  . furosemide (LASIX) 40 MG tablet Take 1 tablet (40 mg total) by mouth daily.  90 tablet  1  . gabapentin (NEURONTIN) 300 MG capsule TAKE 1 CAPSULE (300 MG TOTAL) BY MOUTH 3 (THREE) TIMES DAILY.  90 capsule  5  . hydrochlorothiazide 25 MG tablet Take 25 mg by mouth every morning.       . insulin NPH Human (HUMULIN N,NOVOLIN N) 100 UNIT/ML injection 45 units each morning, and 30 units each evening.      . lamoTRIgine (LAMICTAL) 200 MG tablet Take 200 mg by mouth daily at 12 noon.       Marland Kitchen levothyroxine (SYNTHROID) 50 MCG tablet TAKE 1 TABLET DAILY  90 tablet  1  . Magnesium 200 MG TABS Take 200 mg by mouth daily.      . Multiple Vitamin (MULTIVITAMIN WITH MINERALS) TABS Take 1 tablet by mouth every evening.      Marland Kitchen omeprazole (PRILOSEC) 20 MG capsule TAKE 1 CAPSULE TWICE DAILY  180 capsule  1  . oxybutynin (DITROPAN-XL) 10 MG 24 hr tablet Take 10 mg by mouth every evening.       . potassium citrate (UROCIT-K 10) 10 MEQ (1080 MG) SR tablet Take 20 mEq by mouth 2 (two) times daily.       . prazosin (MINIPRESS) 5 MG capsule Take 1 mg by mouth every evening. 3 tablets every evening.      . propranolol (INDERAL) 40 MG tablet Take 1 tablet (40 mg total) by mouth 2 (two) times daily.  60 tablet  11  . Thiamine HCl (VITAMIN B-1) 100 MG tablet Take 100 mg by mouth every evening.       Marland Kitchen  sertraline (ZOLOFT) 100 MG tablet Take 200 mg by mouth daily. Take 200 mg once a day.       No current facility-administered medications on file prior to visit.     No Known Allergies  Family History  Problem Relation Age of Onset  . Hypertension Mother   . Diabetes Father   . Hypertension Father   . Hyperlipidemia Father   . Heart attack Other   . Coronary artery disease Other     BP 124/82  Pulse 63  Temp(Src) 97.6 F (36.4 C) (Oral)  Ht 5\' 3"  (1.6 m)  Wt 266 lb (120.657 kg)  BMI 47.13 kg/m2  SpO2 93%  Review of Systems She denies hypoglycemia and weight change.      Objective:   Physical Exam VITAL SIGNS:  See vs page GENERAL: no distress   Lab Results  Component Value Date   HGBA1C 8.1* 07/16/2013      Assessment & Plan:  DM: she needs increased rx bipolar disorder: this complicates the rx of DM. Fibromyalgia: this limits exercise of DM.  Weight gain: this complicates the rx of DM.

## 2013-07-24 ENCOUNTER — Telehealth: Payer: Self-pay | Admitting: Internal Medicine

## 2013-07-24 DIAGNOSIS — E1049 Type 1 diabetes mellitus with other diabetic neurological complication: Secondary | ICD-10-CM

## 2013-07-24 DIAGNOSIS — E1065 Type 1 diabetes mellitus with hyperglycemia: Secondary | ICD-10-CM

## 2013-07-24 NOTE — Telephone Encounter (Signed)
Pt has an Eye appt with Dr. Laurey Morale at [facility] on April 28.  They need a referral sent for the appt.

## 2013-07-24 NOTE — Telephone Encounter (Signed)
Refer done thanks

## 2013-07-25 DIAGNOSIS — R82994 Hypercalciuria: Secondary | ICD-10-CM | POA: Insufficient documentation

## 2013-07-25 DIAGNOSIS — N200 Calculus of kidney: Secondary | ICD-10-CM | POA: Insufficient documentation

## 2013-07-25 NOTE — Telephone Encounter (Signed)
Notified pt with md response.../lmb 

## 2013-07-26 ENCOUNTER — Ambulatory Visit: Payer: Medicare Other | Admitting: Family Medicine

## 2013-07-27 ENCOUNTER — Ambulatory Visit (INDEPENDENT_AMBULATORY_CARE_PROVIDER_SITE_OTHER): Payer: Medicare Other | Admitting: Family Medicine

## 2013-07-27 ENCOUNTER — Encounter: Payer: Self-pay | Admitting: Family Medicine

## 2013-07-27 VITALS — BP 130/74 | HR 61

## 2013-07-27 DIAGNOSIS — M25579 Pain in unspecified ankle and joints of unspecified foot: Secondary | ICD-10-CM

## 2013-07-27 LAB — HM DIABETES EYE EXAM

## 2013-07-27 NOTE — Assessment & Plan Note (Signed)
Patient had orthotics made for her today. Patient is going to slowly increase the amount wear over the course of time. We discussed proper care and likely longevity of these orthotics for 2-5 years. We discussed continuing icing at home exercises. Patient was again declined formal physical therapy. Patient will try this and come back again in 3 weeks for further evaluation.

## 2013-07-27 NOTE — Progress Notes (Signed)
  [name] [address], [place] [address] Phone: [phone] Subjective:     CC: foot pain bilaterally.   [id number] Brittany Acosta is a 63 y.o. female coming in with complaint of left ankle pain and bilateral foot pain. Patient does have significant osteophytic changes of the ankles bilaterally as well as peripheral neuropathy and fibromyalgia is likely contributing to her pain. Patient did get over-the-counter orthotics and has tried some other medications with minimal benefit. Patient states that she continues to have significant ankle and foot pain with walking greater than 1 mile distance. Denies any new symptoms.  Patient does have significant lateral column overload secondary to patient's body habitus as well as significant pes planus of the feet bilaterally.     Past medical history, social, surgical and family history all reviewed in electronic medical record.   Review of Systems: No headache, visual changes, nausea, vomiting, diarrhea, constipation, dizziness, abdominal pain, skin rash, fevers, chills, night sweats, weight loss, swollen lymph nodes, body aches, joint swelling, muscle aches, chest pain, shortness of breath, mood changes.   Objective Blood pressure 130/74, pulse 61, SpO2 95.00%.  General: No apparent distress alert and oriented x3 mood and affect normal, dressed appropriately.  HEENT: Pupils equal, extraocular movements intact  Respiratory: Patient's speak in full sentences and does not appear short of breath  Cardiovascular: 2+ lower extremity edema, non tender, no erythema  Skin: Warm dry intact with no signs of infection or rash on extremities or on axial skeleton.  Abdomen: Soft nontender  Neuro: Cranial nerves II through XII are intact, neurovascularly intact in all extremities with 2+ DTRs and 2+ pulses.  Lymph: No lymphadenopathy of posterior or anterior cervical chain or axillae bilaterally.  Gait normal with good  balance and coordination.  MSK:  Non tender with full range of motion and good stability and symmetric strength and tone of shoulders, elbows, wrist, hip, knee and ankles bilaterally.  Foot Exam shows the patient does have swelling of ankles bilaterally. Patient does have overpronation hindfoot bilaterally significantly. Patient does have some breakdown of the longitudinal as well as the transverse arch causing splaying between the first and second toes bilaterally right greater than left. Patient does have hammering of the third fourth and fifth toes bilaterally. Mild tibial deviation of the large toes bilaterally. Patient does have mild bunion formations bilaterally with mild bunionette. Nontender on exam good range of motion of the ankles.   For suture note  Patient was fitted for a : standard, cushioned, semi-rigid orthotic. The orthotic was heated and afterward the patient stood on the orthotic blank positioned on the orthotic stand. The patient was positioned in subtalar neutral position and 10 degrees of ankle dorsiflexion in a weight bearing stance. After completion of molding, a stable base was applied to the orthotic blank. The blank was ground to a stable position for weight bearing. Size: 8 Base: Left her with cork bottom Additional Posting and Padding: None The patient ambulated these, and they were very comfortable.  I spent 45 minutes with this patient, greater than 50% was face-to-face time counseling regarding the below diagnosis.      Impression and Recommendations:

## 2013-07-27 NOTE — Patient Instructions (Signed)
Good to see you We will call you when we have the orthotics.  Wear them for 3-4 hours the first day and increase 1 hour daily until full day.  Still continue exercises and icing.  See me again in 3 weeks after orthotics to make sure we are doing better.

## 2013-08-06 ENCOUNTER — Encounter: Payer: Self-pay | Admitting: Internal Medicine

## 2013-08-06 ENCOUNTER — Ambulatory Visit: Admission: RE | Admit: 2013-08-06 | Payer: Medicare Other | Source: Ambulatory Visit

## 2013-08-06 ENCOUNTER — Ambulatory Visit (INDEPENDENT_AMBULATORY_CARE_PROVIDER_SITE_OTHER): Payer: Medicare Other | Admitting: Internal Medicine

## 2013-08-06 ENCOUNTER — Other Ambulatory Visit (INDEPENDENT_AMBULATORY_CARE_PROVIDER_SITE_OTHER): Payer: Medicare Other

## 2013-08-06 ENCOUNTER — Ambulatory Visit: Payer: Medicare Other

## 2013-08-06 VITALS — BP 130/76 | HR 64 | Temp 98.1°F | Wt 268.8 lb

## 2013-08-06 DIAGNOSIS — R928 Other abnormal and inconclusive findings on diagnostic imaging of breast: Secondary | ICD-10-CM

## 2013-08-06 DIAGNOSIS — Z0000 Encounter for general adult medical examination without abnormal findings: Secondary | ICD-10-CM

## 2013-08-06 DIAGNOSIS — E039 Hypothyroidism, unspecified: Secondary | ICD-10-CM

## 2013-08-06 DIAGNOSIS — E1049 Type 1 diabetes mellitus with other diabetic neurological complication: Secondary | ICD-10-CM

## 2013-08-06 DIAGNOSIS — E1065 Type 1 diabetes mellitus with hyperglycemia: Secondary | ICD-10-CM

## 2013-08-06 LAB — TSH: TSH: 3.09 u[IU]/mL (ref 0.35–5.50)

## 2013-08-06 NOTE — Progress Notes (Signed)
Subjective:    Patient ID: Brittany Acosta, female    DOB: 1951/03/26, 63 y.o.   MRN: [id number]  HPI   Here for medicare wellness  Diet: heart healthy, diabetic Physical activity: sedentary Depression/mood screen: negative Hearing: intact to whispered voice Visual acuity: grossly normal, performs annual eye exam  ADLs: capable Fall risk: none Home safety: good Cognitive evaluation: intact to orientation, naming, recall and repetition EOL planning: adv directives, full code/ I agree  I have personally reviewed and have noted 1. The patient's medical and social history 2. Their use of alcohol, tobacco or illicit drugs 3. Their current medications and supplements 4. The patient's functional ability including ADL's, fall risks, home safety risks and hearing or visual impairment. 5. Diet and physical activities 6. Evidence for depression or mood disorders  Also reviewed chronic medical issues and interval medical events  Past Medical History  Diagnosis Date  . GOITER, MULTINODULAR   . HYPOTHYROIDISM   . HYPERCHOLESTEROLEMIA   . Morbid obesity   . BIPOLAR DISORDER UNSPECIFIED     depression  . Chronic diastolic heart failure   . ALLERGIC RHINITIS   . FIBROMYALGIA   . GERD (gastroesophageal reflux disease)   . History of wrist fracture     rt wrist  . Depression   . Type II diabetes mellitus   . Neuropathy due to secondary diabetes   . Iron deficiency anemia   . Kidney stones     sees urologist @ [place]  . History of gout     "haven't had it in several years" (02/08/2012)  . PTSD (post-traumatic stress disorder)    Family History  Problem Relation Age of Onset  . Hypertension Mother   . Diabetes Father   . Hypertension Father   . Hyperlipidemia Father   . Heart attack Other   . Coronary artery disease Other    History  Substance Use Topics  . Smoking status: Former Smoker -- 1.00 packs/day for 6 years    Types: Cigarettes    Quit date: 04/06/1983  . Smokeless  tobacco: Never Used     Comment: Married, lives with spouse. Disable- 2 grown kids-6 g-kids  . Alcohol Use: No     Comment: none    Review of Systems  Constitutional: Negative for fatigue and unexpected weight change.  Respiratory: Negative for cough, shortness of breath and wheezing.   Cardiovascular: Negative for chest pain, palpitations and leg swelling.  Gastrointestinal: Negative for nausea, abdominal pain and diarrhea.  Musculoskeletal: Positive for arthralgias (L shoulder - working with ortho ([facility]) on same -?rtc).  Neurological: Negative for dizziness, weakness, light-headedness and headaches.  Psychiatric/Behavioral: Negative for dysphoric mood. The patient is not nervous/anxious.   All other systems reviewed and are negative.      Objective:   Physical Exam  BP 130/76  Pulse 64  Temp(Src) 98.1 F (36.7 C) (Oral)  Wt 268 lb 12.8 oz (121.927 kg)  SpO2 94% Wt Readings from Last 3 Encounters:  08/06/13 268 lb 12.8 oz (121.927 kg)  07/16/13 266 lb (120.657 kg)  07/02/13 266 lb (120.657 kg)   Constitutional: She is obese, but appears well-developed and well-nourished. No distress.  Neck: Normal range of motion. Neck supple. No JVD present. No thyromegaly present.  Cardiovascular: Normal rate, regular rhythm and normal heart sounds.  No murmur heard. No BLE edema. Pulmonary/Chest: Effort normal and breath sounds normal. No respiratory distress. She has no wheezes.  MSkel: L decreased range of motion on forward  flexion, abduction, and internal rotation. Positive impingement signs. Decreased strength with stressing of rotator cuff. Pain with crossed arm adduction. referred pain into distal deltoid. Tender over a.c. joint and subacromial. Psychiatric: She has a normal mood and affect. Her behavior is normal. Judgment and thought content normal.   Lab Results  Component Value Date   WBC 12.0* 12/26/2012   HGB 12.9 12/26/2012   HCT 38.8 12/26/2012   PLT 306.0 12/26/2012    GLUCOSE 182* 12/26/2012   CHOL 116 12/06/2012   TRIG 130.0 12/06/2012   HDL 53.20 12/06/2012   LDLDIRECT 56.4 06/11/2011   LDLCALC 37 12/06/2012   ALT 29 12/26/2012   AST 28 12/26/2012   NA 137 12/26/2012   K 3.6 12/26/2012   CL 98 12/26/2012   CREATININE 0.8 12/26/2012   BUN 11 12/26/2012   CO2 31 12/26/2012   TSH 2.93 12/12/2012   INR 1.01 02/04/2012   HGBA1C 8.1* 07/16/2013   MICROALBUR 0.4 12/06/2012    Ct Abdomen Pelvis Wo Contrast  12/12/2012   CLINICAL DATA:  Right flank pain. History of kidney stones.  EXAM: CT ABDOMEN AND PELVIS WITHOUT CONTRAST  TECHNIQUE: Multidetector CT imaging of the abdomen and pelvis was performed following the standard protocol without intravenous contrast.  COMPARISON:  01/21/2012  FINDINGS: Lung bases are clear. No effusions. Heart is normal size.  Prior cholecystectomy. Liver, spleen, adrenals are unremarkable. Mild fatty replacement of the pancreas without focal abnormality. Small transverse duodenal diverticulum.  No renal or ureteral stones. No hydronephrosis.  Bowel grossly unremarkable. No free fluid, free air, or adenopathy.  Aorta is normal caliber. There is a retro aortic left renal vein.  No acute bony abnormality. Degenerative changes at L5-S1. Small 's scattered sclerotic foci within the right sacrum and right iliac bone, presumably: [place]. These are unchanged since prior study.  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 This currently measures 5.5 x 3.9 cm compared with 5.0 x 3.4 cm previously. The slight difference in size may be technical. This remains well-circumscribed and measures water density most compatible with a mesenteric/peritoneal cyst.  IMPRESSION: No renal or ureteral stones. No hydronephrosis.  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   Electronically Signed   By: Rolm Baptise M.D.   On: 12/12/2012 13:02       Assessment & Plan:   AWV/v70.0 - Today patient counseled on age appropriate routine health  concerns for screening and prevention, each reviewed and up to date or declined. Immunizations reviewed and up to date or declined. Labs reviewed. Risk factors for depression reviewed and negative. Hearing function and visual acuity are intact. ADLs screened and addressed as needed. Functional ability and level of safety reviewed and appropriate. Education, counseling and referrals performed based on assessed risks today. Patient provided with a copy of personalized plan for preventive services.  Problem List Items Addressed This Visit   Abnormal mammogram     Scheduled for annual follow up today, 2014 reviewed    HYPOTHYROIDISM      Check TSH now, adjust as needed 04/2011 underwent L thyroid lobectomy due to goiter with mass effect  Lab Results  Component Value Date   TSH 2.93 12/12/2012      Relevant Orders      TSH   MORBID OBESITY      Wt Readings from Last 3 Encounters:  08/06/13 268 lb 12.8 oz (121.927 kg)  07/16/13 266 lb (120.657 kg)  07/02/13 266 lb (120.657 kg)  Pt understands this to be greatest obstacle to her health Encouraged to investigate bariatric options - she will consider, but is reluctant    Type I (juvenile type) diabetes mellitus with neurological manifestations, 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

## 2013-08-06 NOTE — Assessment & Plan Note (Signed)
Follows with podiatry, optho and endo for same -  on gabapentin since 12/2010 in addition to elavil for neuropathy- continue same On ASA and statin meds managed by endo - NPH 70/30 only, metformin stopped 12/2011 Check a1c q3-63mo, microalb, lipids and titrate tx as needed  Lab Results  Component Value Date   HGBA1C 8.1* 07/16/2013

## 2013-08-06 NOTE — Assessment & Plan Note (Signed)
Check TSH now, adjust as needed 04/2011 underwent L thyroid lobectomy due to goiter with mass effect  Lab Results  Component Value Date   TSH 2.93 12/12/2012

## 2013-08-06 NOTE — Patient Instructions (Addendum)
It was good to see you today.  We have reviewed your prior records including labs and tests today  Health Maintenance reviewed - all recommended immunizations and age-appropriate screenings are up-to-date.  Test(s) ordered today. Your results will be released to [facility] (or called to you) after review, usually within 72hours after test completion. If any changes need to be made, you will be notified at that same time.  Medications reviewed and updated, no changes recommended at this time.  Please schedule followup in 6 months for semiannual exam and labs, call sooner if problems.  Health Maintenance, Female A healthy lifestyle and preventative care can promote health and wellness.  Maintain regular health, dental, and eye exams.  Eat a healthy diet. Foods like vegetables, fruits, whole grains, low-fat dairy products, and lean protein foods contain the nutrients you need without too many calories. Decrease your intake of foods high in solid fats, added sugars, and salt. Get information about a proper diet from your caregiver, if necessary.  Regular physical exercise is one of the most important things you can do for your health. Most adults should get at least 150 minutes of moderate-intensity exercise (any activity that increases your heart rate and causes you to sweat) each week. In addition, most adults need muscle-strengthening exercises on 2 or more days a week.   Maintain a healthy weight. The body mass index (BMI) is a screening tool to identify possible weight problems. It provides an estimate of body fat based on height and weight. Your caregiver can help determine your BMI, and can help you achieve or maintain a healthy weight. For adults 20 years and older:  A BMI below 18.5 is considered underweight.  A BMI of 18.5 to 24.9 is normal.  A BMI of 25 to 29.9 is considered overweight.  A BMI of 30 and above is considered obese.  Maintain normal blood lipids and cholesterol by  exercising and minimizing your intake of saturated fat. Eat a balanced diet with plenty of fruits and vegetables. Blood tests for lipids and cholesterol should begin at age 44 and be repeated every 5 years. If your lipid or cholesterol levels are high, you are over 50, or you are a high risk for heart disease, you may need your cholesterol levels checked more frequently.Ongoing high lipid and cholesterol levels should be treated with medicines if diet and exercise are not effective.  If you smoke, find out from your caregiver how to quit. If you do not use tobacco, do not start.  Lung cancer screening is recommended for adults aged 34 80 years who are at high risk for developing lung cancer because of a history of smoking. Yearly low-dose computed tomography (CT) is recommended for people who have at least a 30-pack-year history of smoking and are a current smoker or have quit within the past 15 years. A pack year of smoking is smoking an average of 1 pack of cigarettes a day for 1 year (for example: 1 pack a day for 30 years or 2 packs a day for 15 years). Yearly screening should continue until the smoker has stopped smoking for at least 15 years. Yearly screening should also be stopped for people who develop a health problem that would prevent them from having lung cancer treatment.  If you are pregnant, do not drink alcohol. If you are breastfeeding, be very cautious about drinking alcohol. If you are not pregnant and choose to drink alcohol, do not exceed 1 drink per day. One drink  is considered to be 12 ounces (355 mL) of beer, 5 ounces (148 mL) of wine, or 1.5 ounces (44 mL) of liquor.  Avoid use of street drugs. Do not share needles with anyone. Ask for help if you need support or instructions about stopping the use of drugs.  High blood pressure causes heart disease and increases the risk of stroke. Blood pressure should be checked at least every 1 to 2 years. Ongoing high blood pressure should be  treated with medicines, if weight loss and exercise are not effective.  If you are 39 to 63 years old, ask your caregiver if you should take aspirin to prevent strokes.  Diabetes screening involves taking a blood sample to check your fasting blood sugar level. This should be done once every 3 years, after age 66, if you are within normal weight and without risk factors for diabetes. Testing should be considered at a younger age or be carried out more frequently if you are overweight and have at least 1 risk factor for diabetes.  Breast cancer screening is essential preventative care for women. You should practice "breast self-awareness." This means understanding the normal appearance and feel of your breasts and may include breast self-examination. Any changes detected, no matter how small, should be reported to a caregiver. Women in their 73s and 30s should have a clinical breast exam (CBE) by a caregiver as part of a regular health exam every 1 to 3 years. After age 39, women should have a CBE every year. Starting at age 91, women should consider having a mammogram (breast X-ray) every year. Women who have a family history of breast cancer should talk to their caregiver about genetic screening. Women at a high risk of breast cancer should talk to their caregiver about having an MRI and a mammogram every year.  Breast cancer gene (BRCA)-related cancer risk assessment is recommended for women who have family members with BRCA-related cancers. BRCA-related cancers include breast, ovarian, tubal, and peritoneal cancers. Having family members with these cancers may be associated with an increased risk for harmful changes (mutations) in the breast cancer genes BRCA1 and BRCA2. Results of the assessment will determine the need for genetic counseling and BRCA1 and BRCA2 testing.  The Pap test is a screening test for cervical cancer. Women should have a Pap test starting at age 63. Between ages 85 and 56, Pap  tests should be repeated every 2 years. Beginning at age 29, you should have a Pap test every 3 years as long as the past 3 Pap tests have been normal. If you had a hysterectomy for a problem that was not cancer or a condition that could lead to cancer, then you no longer need Pap tests. If you are between ages 49 and 47, and you have had normal Pap tests going back 10 years, you no longer need Pap tests. If you have had past treatment for cervical cancer or a condition that could lead to cancer, you need Pap tests and screening for cancer for at least 20 years after your treatment. If Pap tests have been discontinued, risk factors (such as a new sexual partner) need to be reassessed to determine if screening should be resumed. Some women have medical problems that increase the chance of getting cervical cancer. In these cases, your caregiver may recommend more frequent screening and Pap tests.  The human papillomavirus (HPV) test is an additional test that may be used for cervical cancer screening. The HPV test looks for  the virus that can cause the cell changes on the cervix. The cells collected during the Pap test can be tested for HPV. The HPV test could be used to screen women aged 31 years and older, and should be used in women of any age who have unclear Pap test results. After the age of 35, women should have HPV testing at the same frequency as a Pap test.  Colorectal cancer can be detected and often prevented. Most routine colorectal cancer screening begins at the age of 76 and continues through age 69. However, your caregiver may recommend screening at an earlier age if you have risk factors for colon cancer. On a yearly basis, your caregiver may provide home test kits to check for hidden blood in the stool. Use of a small camera at the end of a tube, to directly examine the colon (sigmoidoscopy or colonoscopy), can detect the earliest forms of colorectal cancer. Talk to your caregiver about this at  age 68, when routine screening begins. Direct examination of the colon should be repeated every 5 to 10 years through age 84, unless early forms of pre-cancerous polyps or small growths are found.  Hepatitis C blood testing is recommended for all people born from [birth year] through 1965 and any individual with known risks for hepatitis C.  Practice safe sex. Use condoms and avoid high-risk sexual practices to reduce the spread of sexually transmitted infections (STIs). Sexually active women aged 66 and younger should be checked for Chlamydia, which is a common sexually transmitted infection. Older women with new or multiple partners should also be tested for Chlamydia. Testing for other STIs is recommended if you are sexually active and at increased risk.  Osteoporosis is a disease in which the bones lose minerals and strength with aging. This can result in serious bone fractures. The risk of osteoporosis can be identified using a bone density scan. Women ages 65 and over and women at risk for fractures or osteoporosis should discuss screening with their caregivers. Ask your caregiver whether you should be taking a calcium supplement or vitamin D to reduce the rate of osteoporosis.  Menopause can be associated with physical symptoms and risks. Hormone replacement therapy is available to decrease symptoms and risks. You should talk to your caregiver about whether hormone replacement therapy is right for you.  Use sunscreen. Apply sunscreen liberally and repeatedly throughout the day. You should seek shade when your shadow is shorter than you. Protect yourself by wearing long sleeves, pants, a wide-brimmed hat, and sunglasses year round, whenever you are outdoors.  Notify your caregiver of new moles or changes in moles, especially if there is a change in shape or color. Also notify your caregiver if a mole is larger than the size of a pencil eraser.  Stay current with your immunizations. Document Released:  10/05/2010 Document Revised: 07/17/2012 Document Reviewed: 10/05/2010 [facility] Patient Information 2014 [place].

## 2013-08-06 NOTE — Assessment & Plan Note (Signed)
Scheduled for annual follow up today, 2014 reviewed

## 2013-08-06 NOTE — Progress Notes (Signed)
Pre visit review using our clinic review tool, if applicable. No additional management support is needed unless otherwise documented below in the visit note. 

## 2013-08-06 NOTE — Assessment & Plan Note (Signed)
Wt Readings from Last 3 Encounters:  08/06/13 268 lb 12.8 oz (121.927 kg)  07/16/13 266 lb (120.657 kg)  07/02/13 266 lb (120.657 kg)   Pt understands this to be greatest obstacle to her health Encouraged to investigate bariatric options - she will consider, but is reluctant

## 2013-08-13 ENCOUNTER — Ambulatory Visit: Payer: Medicare Other | Admitting: Internal Medicine

## 2013-08-15 ENCOUNTER — Ambulatory Visit
Admission: RE | Admit: 2013-08-15 | Discharge: 2013-08-15 | Disposition: A | Discharge status: Home / Self Care | Payer: Medicare Other | Source: Ambulatory Visit | Attending: Internal Medicine | Admitting: Internal Medicine

## 2013-08-15 DIAGNOSIS — Z1231 Encounter for screening mammogram for malignant neoplasm of breast: Secondary | ICD-10-CM

## 2013-08-15 DIAGNOSIS — Z9889 Other specified postprocedural states: Secondary | ICD-10-CM

## 2013-08-16 ENCOUNTER — Encounter (HOSPITAL_BASED_OUTPATIENT_CLINIC_OR_DEPARTMENT_OTHER): Payer: Self-pay | Admitting: *Deleted

## 2013-08-16 ENCOUNTER — Other Ambulatory Visit: Payer: Self-pay | Admitting: Orthopedic Surgery

## 2013-08-16 ENCOUNTER — Other Ambulatory Visit: Payer: Self-pay | Admitting: Internal Medicine

## 2013-08-16 NOTE — Progress Notes (Signed)
Pt has had many surgeries and shoulders in past-is obese, had a sleep study 7/14-mild-did not need cpap-diabetes not well controlled, meds adjusted-will bring all meds and overnight bag in case she has to stay-to come in for bmet-ekg-did have a cardiac work up dr Martinique 2012-stress test normal-echo normal-did not need to f/u

## 2013-08-16 NOTE — Progress Notes (Signed)
Called susan-or sch. About the type and screen order-if he wanted her to have blood-she needs to be moved to main or

## 2013-08-17 ENCOUNTER — Encounter (HOSPITAL_BASED_OUTPATIENT_CLINIC_OR_DEPARTMENT_OTHER)
Admission: RE | Admit: 2013-08-17 | Discharge: 2013-08-17 | Disposition: A | Discharge status: Home / Self Care | Payer: Medicare Other | Source: Ambulatory Visit | Attending: Orthopedic Surgery | Admitting: Orthopedic Surgery

## 2013-08-17 ENCOUNTER — Encounter (HOSPITAL_BASED_OUTPATIENT_CLINIC_OR_DEPARTMENT_OTHER): Payer: Self-pay | Admitting: *Deleted

## 2013-08-17 ENCOUNTER — Ambulatory Visit: Payer: Medicare Other

## 2013-08-17 LAB — BASIC METABOLIC PANEL
BUN: 15 mg/dL (ref 6–23)
CO2: 30 mEq/L (ref 19–32)
Calcium: 9 mg/dL (ref 8.4–10.5)
Chloride: 97 mEq/L (ref 96–112)
Creatinine, Ser: 0.74 mg/dL (ref 0.50–1.10)
GFR calc Af Amer: >90 mL/min (ref >90)
GFR calc non Af Amer: 89 mL/min — ABNORMAL LOW (ref >90)
Glucose, Bld: 382 mg/dL — ABNORMAL HIGH (ref 70–99)
Potassium: 4.3 mEq/L (ref 3.7–5.3)
Sodium: 136 mEq/L — ABNORMAL LOW (ref 137–147)

## 2013-08-20 ENCOUNTER — Ambulatory Visit (HOSPITAL_BASED_OUTPATIENT_CLINIC_OR_DEPARTMENT_OTHER): Payer: Medicare Other | Admitting: Anesthesiology

## 2013-08-20 ENCOUNTER — Encounter (HOSPITAL_BASED_OUTPATIENT_CLINIC_OR_DEPARTMENT_OTHER): Payer: Medicare Other | Admitting: Anesthesiology

## 2013-08-20 ENCOUNTER — Encounter (HOSPITAL_BASED_OUTPATIENT_CLINIC_OR_DEPARTMENT_OTHER)
Admission: RE | Disposition: A | Discharge status: Home / Self Care | Payer: Self-pay | Source: Ambulatory Visit | Attending: Orthopedic Surgery

## 2013-08-20 ENCOUNTER — Ambulatory Visit: Payer: Medicare Other | Admitting: Family Medicine

## 2013-08-20 ENCOUNTER — Encounter (HOSPITAL_BASED_OUTPATIENT_CLINIC_OR_DEPARTMENT_OTHER): Payer: Self-pay

## 2013-08-20 ENCOUNTER — Ambulatory Visit (HOSPITAL_BASED_OUTPATIENT_CLINIC_OR_DEPARTMENT_OTHER)
Admission: RE | Admit: 2013-08-20 | Discharge: 2013-08-20 | Disposition: A | Discharge status: Home / Self Care | Payer: Medicare Other | Source: Ambulatory Visit | Attending: Orthopedic Surgery | Admitting: Orthopedic Surgery

## 2013-08-20 DIAGNOSIS — F319 Bipolar disorder, unspecified: Secondary | ICD-10-CM | POA: Insufficient documentation

## 2013-08-20 DIAGNOSIS — Z794 Long term (current) use of insulin: Secondary | ICD-10-CM | POA: Insufficient documentation

## 2013-08-20 DIAGNOSIS — I4510 Unspecified right bundle-branch block: Secondary | ICD-10-CM | POA: Insufficient documentation

## 2013-08-20 DIAGNOSIS — Z79899 Other long term (current) drug therapy: Secondary | ICD-10-CM | POA: Insufficient documentation

## 2013-08-20 DIAGNOSIS — E1149 Type 2 diabetes mellitus with other diabetic neurological complication: Secondary | ICD-10-CM | POA: Insufficient documentation

## 2013-08-20 DIAGNOSIS — M25819 Other specified joint disorders, unspecified shoulder: Secondary | ICD-10-CM | POA: Insufficient documentation

## 2013-08-20 DIAGNOSIS — D509 Iron deficiency anemia, unspecified: Secondary | ICD-10-CM | POA: Insufficient documentation

## 2013-08-20 DIAGNOSIS — Z7982 Long term (current) use of aspirin: Secondary | ICD-10-CM | POA: Insufficient documentation

## 2013-08-20 DIAGNOSIS — M659 Unspecified synovitis and tenosynovitis, unspecified site: Secondary | ICD-10-CM | POA: Insufficient documentation

## 2013-08-20 DIAGNOSIS — IMO0001 Reserved for inherently not codable concepts without codable children: Secondary | ICD-10-CM | POA: Insufficient documentation

## 2013-08-20 DIAGNOSIS — E7800 Pure hypercholesterolemia, unspecified: Secondary | ICD-10-CM | POA: Insufficient documentation

## 2013-08-20 DIAGNOSIS — M67919 Unspecified disorder of synovium and tendon, unspecified shoulder: Secondary | ICD-10-CM | POA: Insufficient documentation

## 2013-08-20 DIAGNOSIS — E1142 Type 2 diabetes mellitus with diabetic polyneuropathy: Secondary | ICD-10-CM | POA: Insufficient documentation

## 2013-08-20 DIAGNOSIS — Z87891 Personal history of nicotine dependence: Secondary | ICD-10-CM | POA: Insufficient documentation

## 2013-08-20 DIAGNOSIS — M75122 Complete rotator cuff tear or rupture of left shoulder, not specified as traumatic: Secondary | ICD-10-CM

## 2013-08-20 DIAGNOSIS — G4730 Sleep apnea, unspecified: Secondary | ICD-10-CM | POA: Insufficient documentation

## 2013-08-20 DIAGNOSIS — Z6841 Body Mass Index (BMI) 40.0 and over, adult: Secondary | ICD-10-CM | POA: Insufficient documentation

## 2013-08-20 DIAGNOSIS — K219 Gastro-esophageal reflux disease without esophagitis: Secondary | ICD-10-CM | POA: Insufficient documentation

## 2013-08-20 DIAGNOSIS — F4310 Post-traumatic stress disorder, unspecified: Secondary | ICD-10-CM | POA: Insufficient documentation

## 2013-08-20 DIAGNOSIS — E039 Hypothyroidism, unspecified: Secondary | ICD-10-CM | POA: Insufficient documentation

## 2013-08-20 DIAGNOSIS — M19019 Primary osteoarthritis, unspecified shoulder: Secondary | ICD-10-CM | POA: Insufficient documentation

## 2013-08-20 DIAGNOSIS — M719 Bursopathy, unspecified: Principal | ICD-10-CM | POA: Insufficient documentation

## 2013-08-20 DIAGNOSIS — M7580 Other shoulder lesions, unspecified shoulder: Secondary | ICD-10-CM

## 2013-08-20 HISTORY — PX: SHOULDER ARTHROSCOPY WITH ROTATOR CUFF REPAIR AND SUBACROMIAL DECOMPRESSION: SHX5686

## 2013-08-20 HISTORY — DX: Unspecified right bundle-branch block: I45.10

## 2013-08-20 HISTORY — DX: Sleep apnea, unspecified: G47.30

## 2013-08-20 HISTORY — DX: Presence of spectacles and contact lenses: Z97.3

## 2013-08-20 LAB — GLUCOSE, CAPILLARY
Glucose-Capillary: 143 mg/dL — ABNORMAL HIGH (ref 70–99)
Glucose-Capillary: 139 mg/dL — ABNORMAL HIGH (ref 70–99)

## 2013-08-20 LAB — POCT HEMOGLOBIN-HEMACUE: Hemoglobin: 13.7 g/dL (ref 12.0–15.0)

## 2013-08-20 SURGERY — SHOULDER ARTHROSCOPY WITH ROTATOR CUFF REPAIR AND SUBACROMIAL DECOMPRESSION
Anesthesia: General | Site: Shoulder | Laterality: Left

## 2013-08-20 MED ORDER — MIDAZOLAM HCL 2 MG/2ML IJ SOLN
INTRAMUSCULAR | Status: AC
  Filled 2013-08-20: qty 2

## 2013-08-20 MED ORDER — OXYCODONE-ACETAMINOPHEN 5-325 MG PO TABS: 1.0000 | ORAL_TABLET | ORAL | Status: DC | PRN

## 2013-08-20 MED ORDER — BUPIVACAINE-EPINEPHRINE (PF) 0.5% -1:200000 IJ SOLN
INTRAMUSCULAR | Status: DC | PRN
  Administered 2013-08-20: 20 mL

## 2013-08-20 MED ORDER — PROPOFOL 10 MG/ML IV BOLUS
INTRAVENOUS | Status: DC | PRN
  Administered 2013-08-20: 60 mg via INTRAVENOUS
  Administered 2013-08-20: 250 mg via INTRAVENOUS

## 2013-08-20 MED ORDER — SODIUM CHLORIDE 0.9 % IR SOLN
Status: DC | PRN
  Administered 2013-08-20: 3000 mL

## 2013-08-20 MED ORDER — LACTATED RINGERS IV SOLN
INTRAVENOUS | Status: DC
Start: 2013-08-20 — End: 2013-08-20
  Administered 2013-08-20: 12:00:00 via INTRAVENOUS

## 2013-08-20 MED ORDER — MIDAZOLAM HCL 2 MG/2ML IJ SOLN
1.0000 mg | INTRAMUSCULAR | Status: DC | PRN
  Administered 2013-08-20: 2 mg via INTRAVENOUS

## 2013-08-20 MED ORDER — FENTANYL CITRATE 0.05 MG/ML IJ SOLN
50.0000 ug | INTRAMUSCULAR | Status: DC | PRN
  Administered 2013-08-20: 100 ug via INTRAVENOUS

## 2013-08-20 MED ORDER — CEFAZOLIN SODIUM 10 G IJ SOLR
3.0000 g | INTRAMUSCULAR | Status: AC
  Administered 2013-08-20: 3 g via INTRAVENOUS

## 2013-08-20 MED ORDER — ONDANSETRON HCL 4 MG/2ML IJ SOLN: 4.0000 mg | Freq: Once | INTRAMUSCULAR | Status: DC | PRN

## 2013-08-20 MED ORDER — SUCCINYLCHOLINE CHLORIDE 20 MG/ML IJ SOLN
INTRAMUSCULAR | Status: DC | PRN
  Administered 2013-08-20: 180 mg via INTRAVENOUS

## 2013-08-20 MED ORDER — POVIDONE-IODINE 7.5 % EX SOLN: Freq: Once | CUTANEOUS | Status: DC

## 2013-08-20 MED ORDER — CEFAZOLIN SODIUM-DEXTROSE 2-3 GM-% IV SOLR
INTRAVENOUS | Status: AC
  Filled 2013-08-20: qty 50

## 2013-08-20 MED ORDER — OXYCODONE HCL 5 MG PO TABS: 5.0000 mg | ORAL_TABLET | Freq: Once | ORAL | Status: DC | PRN

## 2013-08-20 MED ORDER — FENTANYL CITRATE 0.05 MG/ML IJ SOLN
INTRAMUSCULAR | Status: AC
  Filled 2013-08-20: qty 6

## 2013-08-20 MED ORDER — CEFAZOLIN SODIUM 1-5 GM-% IV SOLN
INTRAVENOUS | Status: AC
  Filled 2013-08-20: qty 50

## 2013-08-20 MED ORDER — EPHEDRINE SULFATE 50 MG/ML IJ SOLN
INTRAMUSCULAR | Status: DC | PRN
  Administered 2013-08-20 (×2): 15 mg via INTRAVENOUS

## 2013-08-20 MED ORDER — LIDOCAINE HCL (CARDIAC) 20 MG/ML IV SOLN
INTRAVENOUS | Status: DC | PRN
  Administered 2013-08-20: 80 mg via INTRAVENOUS

## 2013-08-20 MED ORDER — ONDANSETRON HCL 4 MG/2ML IJ SOLN
INTRAMUSCULAR | Status: DC | PRN
  Administered 2013-08-20: 4 mg via INTRAVENOUS

## 2013-08-20 MED ORDER — DOCUSATE SODIUM 100 MG PO CAPS: 100.0000 mg | ORAL_CAPSULE | Freq: Three times a day (TID) | ORAL | Status: DC | PRN

## 2013-08-20 MED ORDER — HYDROMORPHONE HCL PF 1 MG/ML IJ SOLN: 0.2500 mg | INTRAMUSCULAR | Status: DC | PRN

## 2013-08-20 MED ORDER — DEXAMETHASONE SODIUM PHOSPHATE 4 MG/ML IJ SOLN
INTRAMUSCULAR | Status: DC | PRN
  Administered 2013-08-20: 10 mg via INTRAVENOUS

## 2013-08-20 MED ORDER — OXYCODONE HCL 5 MG/5ML PO SOLN: 5.0000 mg | Freq: Once | ORAL | Status: DC | PRN

## 2013-08-20 MED ORDER — FENTANYL CITRATE 0.05 MG/ML IJ SOLN
INTRAMUSCULAR | Status: AC
  Filled 2013-08-20: qty 2

## 2013-08-20 SURGICAL SUPPLY — 80 items
BENZOIN TINCTURE PRP APPL 2/3 (GAUZE/BANDAGES/DRESSINGS) IMPLANT
BLADE 15 SAFETY STRL DISP (BLADE) IMPLANT
BLADE SURG ROTATE 9660 (MISCELLANEOUS) IMPLANT
BUR OVAL 4.0 (BURR) ×2 IMPLANT
CANISTER SUCT 3000ML (MISCELLANEOUS) IMPLANT
CANNULA 5.75X71 LONG (CANNULA) ×2 IMPLANT
CANNULA TWIST IN 8.25X7CM (CANNULA) IMPLANT
CHLORAPREP W/TINT 26ML (MISCELLANEOUS) ×4 IMPLANT
DECANTER SPIKE VIAL GLASS SM (MISCELLANEOUS) IMPLANT
DERMABOND ADVANCED (GAUZE/BANDAGES/DRESSINGS)
DERMABOND ADVANCED .7 DNX12 (GAUZE/BANDAGES/DRESSINGS) IMPLANT
DRAPE INCISE IOBAN 66X45 STRL (DRAPES) ×2 IMPLANT
DRAPE STERI 35X30 U-POUCH (DRAPES) ×4 IMPLANT
DRAPE SURG 17X23 STRL (DRAPES) ×2 IMPLANT
DRAPE U 20/CS (DRAPES) ×2 IMPLANT
DRAPE U-SHAPE 47X51 STRL (DRAPES) ×2 IMPLANT
DRAPE U-SHAPE 76X120 STRL (DRAPES) ×4 IMPLANT
DRSG PAD ABDOMINAL 8X10 ST (GAUZE/BANDAGES/DRESSINGS) ×2 IMPLANT
ELECT REM PT RETURN 9FT ADLT (ELECTROSURGICAL)
ELECTRODE REM PT RTRN 9FT ADLT (ELECTROSURGICAL) IMPLANT
GAUZE SPONGE 4X4 12PLY STRL (GAUZE/BANDAGES/DRESSINGS) ×2 IMPLANT
GAUZE SPONGE 4X4 16PLY XRAY LF (GAUZE/BANDAGES/DRESSINGS) IMPLANT
GAUZE XEROFORM 1X8 LF (GAUZE/BANDAGES/DRESSINGS) ×2 IMPLANT
GLOVE BIO SURGEON STRL SZ7 (GLOVE) ×2 IMPLANT
GLOVE BIO SURGEON STRL SZ7.5 (GLOVE) ×2 IMPLANT
GLOVE BIOGEL M STRL SZ7.5 (GLOVE) ×2 IMPLANT
GLOVE BIOGEL PI IND STRL 7.0 (GLOVE) ×1 IMPLANT
GLOVE BIOGEL PI IND STRL 8 (GLOVE) ×2 IMPLANT
GLOVE BIOGEL PI INDICATOR 7.0 (GLOVE) ×1
GLOVE BIOGEL PI INDICATOR 8 (GLOVE) ×2
GOWN STRL REUS W/ TWL LRG LVL3 (GOWN DISPOSABLE) ×2 IMPLANT
GOWN STRL REUS W/ TWL XL LVL3 (GOWN DISPOSABLE) ×1 IMPLANT
GOWN STRL REUS W/TWL LRG LVL3 (GOWN DISPOSABLE) ×2
GOWN STRL REUS W/TWL XL LVL3 (GOWN DISPOSABLE) ×1
MANIFOLD NEPTUNE II (INSTRUMENTS) ×2 IMPLANT
NDL SUT 6 .5 CRC .975X.05 MAYO (NEEDLE) IMPLANT
NEEDLE 1/2 CIR CATGUT .05X1.09 (NEEDLE) IMPLANT
NEEDLE MAYO TAPER (NEEDLE)
NEEDLE SCORPION MULTI FIRE (NEEDLE) IMPLANT
NS IRRIG 1000ML POUR BTL (IV SOLUTION) IMPLANT
PACK ARTHROSCOPY DSU (CUSTOM PROCEDURE TRAY) ×2 IMPLANT
PACK BASIN DAY SURGERY FS (CUSTOM PROCEDURE TRAY) ×2 IMPLANT
PENCIL BUTTON HOLSTER BLD 10FT (ELECTRODE) IMPLANT
RESECTOR FULL RADIUS 4.2MM (BLADE) ×2 IMPLANT
SHEET MEDIUM DRAPE 40X70 STRL (DRAPES) ×2 IMPLANT
SLEEVE SCD COMPRESS KNEE MED (MISCELLANEOUS) ×2 IMPLANT
SLING ARM IMMOBILIZER MED (SOFTGOODS) IMPLANT
SLING ARM LRG ADULT FOAM STRAP (SOFTGOODS) ×2 IMPLANT
SLING ARM MED ADULT FOAM STRAP (SOFTGOODS) IMPLANT
SLING ARM XL FOAM STRAP (SOFTGOODS) IMPLANT
SPONGE LAP 4X18 X RAY DECT (DISPOSABLE) IMPLANT
STRIP CLOSURE SKIN 1/2X4 (GAUZE/BANDAGES/DRESSINGS) IMPLANT
SUCTION FRAZIER TIP 10 FR DISP (SUCTIONS) IMPLANT
SUPPORT WRAP ARM LG (MISCELLANEOUS) ×2 IMPLANT
SUT BONE WAX W31G (SUTURE) IMPLANT
SUT ETHIBOND 2 OS 4 DA (SUTURE) IMPLANT
SUT ETHILON 3 0 PS 1 (SUTURE) ×2 IMPLANT
SUT ETHILON 4 0 PS 2 18 (SUTURE) IMPLANT
SUT FIBERWIRE #2 38 T-5 BLUE (SUTURE)
SUT FIBERWIRE 2-0 18 17.9 3/8 (SUTURE)
SUT MNCRL AB 3-0 PS2 18 (SUTURE) IMPLANT
SUT MNCRL AB 4-0 PS2 18 (SUTURE) IMPLANT
SUT PDS AB 0 CT 36 (SUTURE) IMPLANT
SUT PROLENE 3 0 PS 2 (SUTURE) IMPLANT
SUT TIGER TAPE 7 IN WHITE (SUTURE) IMPLANT
SUT VIC AB 0 CT1 27 (SUTURE)
SUT VIC AB 0 CT1 27XBRD ANBCTR (SUTURE) IMPLANT
SUT VIC AB 2-0 SH 27 (SUTURE)
SUT VIC AB 2-0 SH 27XBRD (SUTURE) IMPLANT
SUTURE FIBERWR #2 38 T-5 BLUE (SUTURE) IMPLANT
SUTURE FIBERWR 2-0 18 17.9 3/8 (SUTURE) IMPLANT
SYR BULB 3OZ (MISCELLANEOUS) IMPLANT
TAPE FIBER 2MM 7IN #2 BLUE (SUTURE) IMPLANT
TOWEL OR 17X24 6PK STRL BLUE (TOWEL DISPOSABLE) ×2 IMPLANT
TOWEL OR NON WOVEN STRL DISP B (DISPOSABLE) ×2 IMPLANT
TUBE CONNECTING 20X1/4 (TUBING) ×2 IMPLANT
TUBING ARTHROSCOPY IRRIG 16FT (MISCELLANEOUS) ×2 IMPLANT
WAND STAR VAC 90 (SURGICAL WAND) ×2 IMPLANT
WATER STERILE IRR 1000ML POUR (IV SOLUTION) ×2 IMPLANT
YANKAUER SUCT BULB TIP NO VENT (SUCTIONS) IMPLANT

## 2013-08-20 NOTE — Op Note (Signed)
Procedure(s): SHOULDER ARTHROSCOPY WITH ROTATOR CUFF REPAIR AND SUBACROMIAL DECOMPRESSION Procedure Note  Brittany Acosta female 63 y.o. 08/20/2013  Procedure(s) and Anesthesia Type: Left shoulder arthroscopic debridement irreparable supraspinatus tear Left shoulder arthroscopic biceps tenotomy Left shoulder arthroscopic subacromial decompression Left shoulder arthroscopic distal clavicle excision  Surgeon(s) and Role:    * Nita Sells, MD - Primary     Surgeon: Nita Sells   Assistants: Jeanmarie Hubert PA-C [facility] was present and scrubbed throughout the procedure and was essential in positioning, assisting with the camera and instrumentation,, and closure)  Anesthesia: General endotracheal anesthesia with preoperative interscalene block    Procedure Detail  Estimated Blood Loss: Min         Drains: none  Blood Given: none         Specimens: none        Complications:  * No complications entered in OR log *         Disposition: PACU - hemodynamically stable.         Condition: stable    Procedure:   INDICATIONS FOR SURGERY: The patient is 63 y.o. female who has a history of left shoulder pain which the fracture and nonoperative management. She was found on MRI to have a large supraspinatus tear with significant atrophy. She also had symptomatic a.c. joint DJD. After discussion of potential treatment option she want to go forward with surgery understanding that the rotator cuff tear was likely irreparable, but that she may benefit from debridement with resection of the a.c. joint to try and decrease her pain. She understood risks benefits alternatives to the procedure and wished to go forward with surgery.  OPERATIVE FINDINGS: Examination under anesthesia: No stiffness or instability Diagnostic Arthroscopy:  Glenoid articular cartilage: Intact Humeral head articular cartilage: Intact Labrum: Intact, mild superior labral fraying, when the  biceps tendon was pulled into the joint there was a fair amount of synovitis in the bicipital groove. Therefore given her irreparable rotator cuff tear I performed a biceps tenotomy. Loose bodies: None Synovitis: Moderate rotator cuff: Full-thickness retracted tear of the supraspinatus beyond the level of the glenoid. Irreparable. Coracoacromial ligament: Frayed indicating impingement. A smoothing procedure was performed of the anterior acromial spur , without taking down the coracoacromial ligament. The a.c. joint was arthritic and was addressed with a standard distal clavicle excision.  DESCRIPTION OF PROCEDURE: The patient was identified in preoperative  holding area where I personally marked the operative site after  verifying site, side, and procedure with the patient. An interscalene block was given by the attending anesthesiologist the holding area.  The patient was taken back to the operating room where general anesthesia was induced without complication and was placed in the beach-chair position with the back  elevated about 60 degrees and all extremities and head and neck carefully padded and  positioned.   The left upper extremity was then prepped and  draped in a standard sterile fashion. The appropriate time-out  procedure was carried out. The patient did receive IV antibiotics  within 30 minutes of incision.   A small posterior portal incision was made and the arthroscope was introduced into the joint. An anterior portal was then established above the subscapularis using needle localization. Small cannula was placed anteriorly. Diagnostic arthroscopy was then carried out with findings as described above.  She was noted to have a retracted supraspinatus tear beyond the level of the glenoid. Superior labrum was mildly elevated from the origin on the superior glenoid. The  biceps tendon was pulled in the joint. There is a fair amount of synovitis around the tendon. Therefore given her  irreparable rotator cuff tear the decision was made to go forth biceps tenotomy to try and maximize her pain relief from the procedure.  The arthroscope was then introduced into the subacromial space a standard lateral portal was established with needle localization. The greater tuberosity was noted to be completely bare. There is some soft tissue remaining here which was debrided down to a smooth surface on the greater tuberosity. The tear was viewed from the lateral portal. Was retracted down all the glenoid. Tissue quality was poor. It was determined that the tear was irreparable. Tear edge was debrided and anteriorly there is a band of scar tissue which likely contributed to anterior impingement was resected. The anterior acromial spur was exposed with the ArthriCare and smoothed from posterior to anterior, to create a flat surface without resecting her detaching the coracoacromial ligament anteriorly.  The distal clavicle was exposed arthroscopically and the bur was used to take off the undersurface for approximately 8 mm from the lateral portal. The bur was then moved to an anterior portal position to complete the distal clavicle excision resecting about 8 mm of the distal clavicle and a smooth even fashion. This was viewed from anterior and lateral portals and felt to be complete.  The arthroscopic equipment was removed from the joint and the portals were closed with 3-0 nylon in an interrupted fashion. Sterile dressings were then applied including Xeroform 4 x 4's ABDs and tape. The patient was then allowed to awaken from general anesthesia, placed in a sling, transferred to the stretcher and taken to the recovery room in stable condition.   POSTOPERATIVE PLAN: The patient will be discharged home today and will followup in one week for suture removal and wound check.

## 2013-08-20 NOTE — H&P (Signed)
Brittany Acosta is an 63 y.o. female.   Chief Complaint: L shoulder pain HPI: L shoulder chronic RCT, failed conservative treatment.  Past Medical History  Diagnosis Date  . GOITER, MULTINODULAR   . HYPOTHYROIDISM   . HYPERCHOLESTEROLEMIA   . Morbid obesity   . BIPOLAR DISORDER UNSPECIFIED     depression  . Chronic diastolic heart failure   . ALLERGIC RHINITIS   . FIBROMYALGIA   . GERD (gastroesophageal reflux disease)   . History of wrist fracture     rt wrist  . Depression   . Type II diabetes mellitus   . Neuropathy due to secondary diabetes   . Iron deficiency anemia   . Kidney stones     sees urologist @ [place]  . History of gout     "haven't had it in several years" (02/08/2012)  . PTSD (post-traumatic stress disorder)   . Wears glasses   . Sleep apnea 7/14    mild osa-did not need cpap -dr Gwenette Greet  . PONV (postoperative nausea and vomiting)   . RBBB     Past Surgical History  Procedure Laterality Date  . Cholecystectomy  1985  . Abdominal hysterectomy  1976  . Tubal ligation  1972  . Right nasal surgery  08/1988  . Right sinus removed  08/1989    tooth partial  . Percitania stone removed (l) kidney  1992  . Lithotripsy (l) kidney  1997  . Left cystoscopy   1990  . Thyroidectomy  04/22/2011    Procedure: THYROIDECTOMY;  Surgeon: Onnie Graham, MD;  Location: [facility];  Service: ENT;  Laterality: N/A;  TOTAL THYROIDECOTMY  . Cataract extraction, bilateral  02/2011    epps  . Total knee arthroplasty  06/18/2011    Procedure: TOTAL KNEE ARTHROPLASTY;  Surgeon: Kerin Salen, MD;  Location: [facility];  Service: Orthopedics;  Laterality: Left;  DEPUY  . Total knee arthroplasty  02/07/2012    Procedure: TOTAL KNEE ARTHROPLASTY;  Surgeon: Kerin Salen, MD;  Location: [facility];  Service: Orthopedics;  Laterality: Right;  . Incisional breast biopsy  2000    right  . Cardiac catheterization  2001    sees Dr Peter Martinique  . Knee arthroscopy  08/2009    right  . Knee arthroscopy with  medial menisectomy      left  . Excisional hemorrhoidectomy      "dr cut out in his office" (02/08/2012)  . Left oophorectomy  1980  . Rotator cuff repair  2013    right shoulder x 2  . Joint replacement      rt knee  . Partial mastectomy with needle localization Left 09/01/2012    Procedure: PARTIAL MASTECTOMY WITH NEEDLE LOCALIZATION;  Surgeon: Adin Hector, MD;  Location: [facility];  Service: General;  Laterality: Left;  . Cataract extraction  2014    Family History  Problem Relation Age of Onset  . Hypertension Mother   . Diabetes Father   . Hypertension Father   . Hyperlipidemia Father   . Heart attack Other   . Coronary artery disease Other    Social History:  reports that she quit smoking about 30 years ago. Her smoking use included Cigarettes. She has a 6 pack-year smoking history. She has never used smokeless tobacco. She reports that she does not drink alcohol or use illicit drugs.  Allergies: No Known Allergies  Medications Prior to Admission  Medication Sig Dispense Refill  . Alpha-Lipoic Acid 300 MG  TABS Take 300 mg by mouth daily.       . ARIPiprazole (ABILIFY) 20 MG tablet Take 10 mg by mouth every morning.       Marland Kitchen aspirin 81 MG tablet Take 81 mg by mouth daily.      Marland Kitchen atorvastatin (LIPITOR) 10 MG tablet Take 1 tablet (10 mg total) by mouth daily.  90 tablet  3  . B-D UF III MINI PEN NEEDLES 31G X 5 MM MISC USE AND DISCARD 1 PEN      NEEDLE TWO TIMES A DAY  90 each  6  . Biotin 10 MG CAPS Take 1 capsule by mouth daily.      . Calcium Carbonate (CALCIUM 600 PO) Take 2 tablets by mouth daily.      . Cholecalciferol (VITAMIN D) 2000 UNITS CAPS Take 1 capsule by mouth daily.      . clonazePAM (KLONOPIN) 1 MG tablet Take 3 mg by mouth at bedtime.       . Coenzyme Q10 120 MG CAPS Take 120 mg by mouth daily.      Marland Kitchen doxepin (SINEQUAN) 10 MG capsule Take 10 mg by mouth at bedtime.       Marland Kitchen estradiol (ESTRACE) 0.5 MG tablet Take 1 tablet (0.5 mg total) by mouth daily.  90  tablet  1  . furosemide (LASIX) 40 MG tablet Take 1 tablet (40 mg total) by mouth daily.  90 tablet  1  . gabapentin (NEURONTIN) 300 MG capsule TAKE 1 CAPSULE (300 MG TOTAL) BY MOUTH 3 (THREE) TIMES DAILY.  90 capsule  5  . hydrochlorothiazide 25 MG tablet Take 25 mg by mouth every morning.       . insulin NPH Human (HUMULIN N,NOVOLIN N) 100 UNIT/ML injection 40 units each morning, and 30 units each evening.      . lamoTRIgine (LAMICTAL) 200 MG tablet Take 200 mg by mouth daily at 12 noon.       Marland Kitchen levothyroxine (SYNTHROID) 50 MCG tablet TAKE 1 TABLET DAILY  90 tablet  1  . Magnesium 200 MG TABS Take 1 tablet by mouth daily.      Marland Kitchen MANGANESE PO Take by mouth. 40mg  daily.      . Multiple Vitamin (MULTIVITAMIN WITH MINERALS) TABS Take 1 tablet by mouth every evening.      . NON FORMULARY Tumeric-720mg  2 times daily.      Marland Kitchen omeprazole (PRILOSEC) 20 MG capsule TAKE 1 CAPSULE TWICE DAILY  180 capsule  1  . oxybutynin (DITROPAN-XL) 10 MG 24 hr tablet Take 10 mg by mouth every evening.       . potassium citrate (UROCIT-K 10) 10 MEQ (1080 MG) SR tablet Take 20 mEq by mouth 2 (two) times daily.       . prazosin (MINIPRESS) 2 MG capsule Take 3 mg by mouth at bedtime.      . propranolol (INDERAL) 40 MG tablet Take 1 tablet (40 mg total) by mouth 2 (two) times daily.  60 tablet  11  . Thiamine HCl (VITAMIN B-1) 100 MG tablet Take 100 mg by mouth every evening.       . Vortioxetine HBr (BRINTELLIX) 10 MG TABS Take 1 tablet by mouth every morning.         Results for orders placed during the hospital encounter of 08/20/13 (from the past 48 hour(s))  GLUCOSE, CAPILLARY     Status: Abnormal   Collection Time    08/20/13 12:00 PM  Result Value Ref Range   Glucose-Capillary 143 (*) 70 - 99 mg/dL  POCT HEMOGLOBIN-HEMACUE     Status: None   Collection Time    08/20/13 12:02 PM      Result Value Ref Range   Hemoglobin 13.7  12.0 - 15.0 g/dL   No results found.  Review of Systems  All other systems  reviewed and are negative.   Blood pressure 103/51, pulse 60, temperature 97.8 F (36.6 C), temperature source Oral, resp. rate 16, height 5\' 3"  (1.6 m), weight 121.201 kg (267 lb 3.2 oz), SpO2 96.00%. Physical Exam  Constitutional: She is oriented to person, place, and time. She appears well-developed and well-nourished.  HENT:  Head: Atraumatic.  Eyes: EOM are normal.  Cardiovascular: Intact distal pulses.   Respiratory: Effort normal.  Musculoskeletal:  L shoulder pain and weakness with RC testing  Neurological: She is alert and oriented to person, place, and time.  Skin: Skin is warm and dry.  Psychiatric: She has a normal mood and affect.     Assessment/Plan L Chronic RCT Will try to fix, but if irrepairable will debride with SAD/DCR Risks / benefits of surgery discussed Consent on chart  NPO for OR Preop antibiotics   Nita Sells 08/20/2013, 2:30 PM

## 2013-08-20 NOTE — Transfer of Care (Signed)
Immediate Anesthesia Transfer of Care Note  Patient: Brittany Acosta  Procedure(s) Performed: Procedure(s) with comments: SHOULDER ARTHROSCOPY  AND SUBACROMIAL DECOMPRESSION (Left) - Left shoulder arthroscopy, debridement, subacromial decompression, distal clavical resection  Patient Location: PACU  Anesthesia Type:GA combined with regional for post-op pain  Level of Consciousness: awake, alert , oriented and patient cooperative  Airway & Oxygen Therapy: Patient Spontanous Breathing and Patient connected to face mask oxygen  Post-op Assessment: Report given to PACU RN and Post -op Vital signs reviewed and stable  Post vital signs: Reviewed and stable  Complications: No apparent anesthesia complications

## 2013-08-20 NOTE — Anesthesia Procedure Notes (Addendum)
Anesthesia Regional Block:  Interscalene brachial plexus block  Pre-Anesthetic Checklist: ,, timeout performed, Correct Patient, Correct Site, Correct Laterality, Correct Procedure, Correct Position, site marked, Risks and benefits discussed,  Surgical consent,  Pre-op evaluation,  At surgeon's request and post-op pain management  Laterality: Left and Upper  Prep: chloraprep       Needles:  Injection technique: Single-shot  Needle Type: Echogenic Needle     Needle Length: 5cm 5 cm Needle Gauge: 21 and 21 G    Additional Needles:  Procedures: ultrasound guided (picture in chart) Interscalene brachial plexus block Narrative:  Start time: 08/20/2013 12:39 PM End time: 08/20/2013 12:45 PM Injection made incrementally with aspirations every 5 mL.  Performed by: Personally  Anesthesiologist: Lorrene Reid, MD   Procedure Name: Intubation Performed by: Terrance Mass Pre-anesthesia Checklist: Patient identified, Timeout performed, Emergency Drugs available, Suction available and Patient being monitored Patient Re-evaluated:Patient Re-evaluated prior to inductionPreoxygenation: Pre-oxygenation with 100% oxygen Intubation Type: IV induction Ventilation: Mask ventilation without difficulty Laryngoscope Size: Miller and 2 Grade View: Grade II Tube type: Oral Tube size: 7.0 mm Number of attempts: 1 Airway Equipment and Method: Stylet Placement Confirmation: ETT inserted through vocal cords under direct vision,  breath sounds checked- equal and bilateral and positive ETCO2 Secured at: 22 cm Tube secured with: Tape Dental Injury: Teeth and Oropharynx as per pre-operative assessment

## 2013-08-20 NOTE — Anesthesia Preprocedure Evaluation (Signed)
Anesthesia Evaluation  Patient identified by MRN, date of birth, ID band Patient awake    Reviewed: Allergy & Precautions, H&P , NPO status , Patient's Chart, lab work & pertinent test results  History of Anesthesia Complications (+) PONV  Airway Mallampati: I TM Distance: >3 FB Neck ROM: Full    Dental  (+) Partial Lower, Dental Advisory Given, Teeth Intact   Pulmonary former smoker,  breath sounds clear to auscultation        Cardiovascular hypertension, Pt. on medications and Pt. on home beta blockers Rhythm:Regular Rate:Normal     Neuro/Psych    GI/Hepatic GERD-  Medicated and Controlled,  Endo/Other  diabetes, Well Controlled, Type 2, Insulin DependentMorbid obesity  Renal/GU      Musculoskeletal   Abdominal   Peds  Hematology   Anesthesia Other Findings   Reproductive/Obstetrics                           Anesthesia Physical Anesthesia Plan  ASA: III  Anesthesia Plan: General   Post-op Pain Management:    Induction:   Airway Management Planned: Oral ETT  Additional Equipment:   Intra-op Plan:   Post-operative Plan: Extubation in OR  Informed Consent: I have reviewed the patients History and Physical, chart, labs and discussed the procedure including the risks, benefits and alternatives for the proposed anesthesia with the patient or authorized representative who has indicated his/her understanding and acceptance.   Dental advisory given  Plan Discussed with: CRNA, Anesthesiologist and Surgeon  Anesthesia Plan Comments:         Anesthesia Quick Evaluation

## 2013-08-20 NOTE — Anesthesia Postprocedure Evaluation (Signed)
  Anesthesia Post-op Note  Patient: Brittany Acosta  Procedure(s) Performed: Procedure(s) with comments: SHOULDER ARTHROSCOPY  AND SUBACROMIAL DECOMPRESSION (Left) - Left shoulder arthroscopy, debridement, subacromial decompression, distal clavical resection  Patient Location: PACU  Anesthesia Type:GA combined with regional for post-op pain  Level of Consciousness: awake, alert  and oriented  Airway and Oxygen Therapy: Patient Spontanous Breathing and Patient connected to face mask oxygen  Post-op Pain: none  Post-op Assessment: Post-op Vital signs reviewed  Post-op Vital Signs: Reviewed  Last Vitals:  Filed Vitals:   08/20/13 1645  BP: 148/84  Pulse: 76  Temp:   Resp: 20    Complications: No apparent anesthesia complications

## 2013-08-20 NOTE — Discharge Instructions (Signed)
Discharge Instructions after Arthroscopic Shoulder Surgery ° ° °A sling has been provided for you. You may remove the sling after 72 hours. The sling may be worn for your protection, if you are in a crowd.  °Use ice on the shoulder intermittently over the first 48 hours after surgery.  °Pain medication has been prescribed for you.  °Use your medication liberally over the first 48 hours, and then begin to taper your use. You may take Extra Strength Tylenol or Tylenol only in place of the pain pills. DO NOT take ANY nonsteroidal anti-inflammatory pain medications: Advil, Motrin, Ibuprofen, Aleve, Naproxen, or Naprosyn.  °You may remove your dressing after two days.  °You may shower 5 days after surgery. The incision CANNOT get wet prior to 5 days. Simply allow the water to wash over the site and then pat dry. Do not rub the incision. Make sure your axilla (armpit) is completely dry after showering.  °Take one aspirin a day for 2 weeks after surgery, unless you have an aspirin sensitivity/allergy or asthma.  °Three to 5 times each day you should perform assisted overhead reaching and external rotation (outward turning) exercises with the operative arm. Both exercises should be done with the non-operative arm used as the "therapist arm" while the operative arm remains relaxed. Ten of each exercise should be done three to five times each day. ° ° ° °Overhead reach is helping to lift your stiff arm up as high as it will go. To stretch your overhead reach, lie flat on your back, relax, and grasp the wrist of the tight shoulder with your opposite hand. Using the power in your opposite arm, bring the stiff arm up as far as it is comfortable. Start holding it for ten seconds and then work up to where you can hold it for a count of 30. Breathe slowly and deeply while the arm is moved. Repeat this stretch ten times, trying to help the arm up a little higher each time.  ° ° ° ° ° °External rotation is turning the arm out to  the side while your elbow stays close to your body. External rotation is best stretched while you are lying on your back. Hold a cane, yardstick, broom handle, or dowel in both hands. Bend both elbows to a right angle. Use steady, gentle force from your normal arm to rotate the hand of the stiff shoulder out away from your body. Continue the rotation as far as it will go comfortably, holding it there for a count of 10. Repeat this exercise ten times.  ° ° ° °Please call [phone] during normal business hours or [phone] after hours for any problems. Including the following: ° °- excessive redness of the incisions °- drainage for more than 4 days °- fever of more than 101.5 F ° °*Please note that pain medications will not be refilled after hours or on weekends. ° ° ° °Post Anesthesia Home Care Instructions ° °Activity: °Get plenty of rest for the remainder of the day. A responsible adult should stay with you for 24 hours following the procedure.  °For the next 24 hours, DO NOT: °-Drive a car °-Operate machinery °-Drink alcoholic beverages °-Take any medication unless instructed by your physician °-Make any legal decisions or sign important papers. ° °Meals: °Start with liquid foods such as gelatin or soup. Progress to regular foods as tolerated. Avoid greasy, spicy, heavy foods. If nausea and/or vomiting occur, drink only clear liquids until the nausea and/or vomiting subsides. Call   your physician if vomiting continues. ° °Special Instructions/Symptoms: °Your throat may feel dry or sore from the anesthesia or the breathing tube placed in your throat during surgery. If this causes discomfort, gargle with warm salt water. The discomfort should disappear within 24 hours. ° ° °Regional Anesthesia Blocks ° °1. Numbness or the inability to move the "blocked" extremity may last from 3-48 hours after placement. The length of time depends on the medication injected and your individual response to the medication. If the  numbness is not going away after 48 hours, call your surgeon. ° °2. The extremity that is blocked will need to be protected until the numbness is gone and the  Strength has returned. Because you cannot feel it, you will need to take extra care to avoid injury. Because it may be weak, you may have difficulty moving it or using it. You may not know what position it is in without looking at it while the block is in effect. ° °3. For blocks in the legs and feet, returning to weight bearing and walking needs to be done carefully. You will need to wait until the numbness is entirely gone and the strength has returned. You should be able to move your leg and foot normally before you try and bear weight or walk. You will need someone to be with you when you first try to ensure you do not fall and possibly risk injury. ° °4. Bruising and tenderness at the needle site are common side effects and will resolve in a few days. ° °5. Persistent numbness or new problems with movement should be communicated to the surgeon or the [facility] ([phone])/ [facility] ([phone]). °

## 2013-08-20 NOTE — Progress Notes (Signed)
Assisted Dr. Crews with left, ultrasound guided, interscalene  block. Side rails up, monitors on throughout procedure. See vital signs in flow sheet. Tolerated Procedure well. 

## 2013-08-22 ENCOUNTER — Encounter (HOSPITAL_BASED_OUTPATIENT_CLINIC_OR_DEPARTMENT_OTHER): Payer: Self-pay | Admitting: Orthopedic Surgery

## 2013-08-31 ENCOUNTER — Ambulatory Visit: Payer: Medicare Other

## 2013-09-03 ENCOUNTER — Ambulatory Visit: Payer: Medicare Other | Admitting: Family Medicine

## 2013-09-07 ENCOUNTER — Ambulatory Visit (INDEPENDENT_AMBULATORY_CARE_PROVIDER_SITE_OTHER): Payer: Medicare Other

## 2013-09-07 DIAGNOSIS — Z2911 Encounter for prophylactic immunotherapy for respiratory syncytial virus (RSV): Secondary | ICD-10-CM

## 2013-09-07 DIAGNOSIS — Z23 Encounter for immunization: Secondary | ICD-10-CM

## 2013-09-10 ENCOUNTER — Other Ambulatory Visit: Payer: Self-pay | Admitting: Internal Medicine

## 2013-09-14 ENCOUNTER — Other Ambulatory Visit: Payer: Self-pay | Admitting: *Deleted

## 2013-09-14 MED ORDER — FUROSEMIDE 40 MG PO TABS: 40.0000 mg | ORAL_TABLET | Freq: Every day | ORAL | Status: DC

## 2013-10-15 ENCOUNTER — Other Ambulatory Visit: Payer: Self-pay | Admitting: Internal Medicine

## 2013-10-15 ENCOUNTER — Ambulatory Visit: Payer: Medicare Other | Admitting: Endocrinology

## 2013-10-18 ENCOUNTER — Encounter: Payer: Self-pay | Admitting: Family Medicine

## 2013-10-18 ENCOUNTER — Ambulatory Visit (INDEPENDENT_AMBULATORY_CARE_PROVIDER_SITE_OTHER): Payer: Medicare Other | Admitting: Family Medicine

## 2013-10-18 ENCOUNTER — Ambulatory Visit (INDEPENDENT_AMBULATORY_CARE_PROVIDER_SITE_OTHER): Payer: Medicare Other | Admitting: Endocrinology

## 2013-10-18 ENCOUNTER — Encounter: Payer: Self-pay | Admitting: Endocrinology

## 2013-10-18 VITALS — BP 114/64 | HR 67 | Temp 97.7°F | Ht 63.0 in | Wt 269.0 lb

## 2013-10-18 VITALS — BP 140/80 | HR 65 | Temp 97.9°F | Ht 63.5 in | Wt 269.0 lb

## 2013-10-18 DIAGNOSIS — G43909 Migraine, unspecified, not intractable, without status migrainosus: Secondary | ICD-10-CM

## 2013-10-18 DIAGNOSIS — E1065 Type 1 diabetes mellitus with hyperglycemia: Secondary | ICD-10-CM

## 2013-10-18 DIAGNOSIS — G629 Polyneuropathy, unspecified: Secondary | ICD-10-CM

## 2013-10-18 DIAGNOSIS — E1049 Type 1 diabetes mellitus with other diabetic neurological complication: Secondary | ICD-10-CM

## 2013-10-18 DIAGNOSIS — M25579 Pain in unspecified ankle and joints of unspecified foot: Secondary | ICD-10-CM

## 2013-10-18 DIAGNOSIS — G609 Hereditary and idiopathic neuropathy, unspecified: Secondary | ICD-10-CM

## 2013-10-18 LAB — HEMOGLOBIN A1C: Hgb A1c MFr Bld: 9.1 % — ABNORMAL HIGH (ref 4.6–6.5)

## 2013-10-18 MED ORDER — GABAPENTIN 300 MG PO CAPS: 600.0000 mg | ORAL_CAPSULE | Freq: Three times a day (TID) | ORAL | Status: DC

## 2013-10-18 NOTE — Patient Instructions (Addendum)
Good to see you COntinue to work with that shoulder.  Try the B12 and if you like it call me next week and we will get you a prescription.  We will increase your gabapentin to 300mg  in AM, 300mg  in PM and 600mg  at night.  New prescription given.  Continue to wear the orthotics whenever you can.  See me again in 4 weeks.

## 2013-10-18 NOTE — Progress Notes (Signed)
  [name] [address], [place] [address] Phone: [phone] Subjective:     CC: foot pain bilaterally.   [id number] Brittany Acosta is a 63 y.o. female coming in with complaint of left ankle pain and bilateral foot pain. Patient does have significant osteophytic changes of the ankles bilaterally as well as peripheral neuropathy and fibromyalgia is likely contributing to her pain. Patient did have custom orthotics made and states that she has made about 25-35% improvement. Patient though it has not been changing shoes with the orthotics. Patient has been wearing more sandals to to the weather. Patient states when she wears her orthotics though it does seem to help. Patient continues to have the neuropathy which he states could be more troubling than the pain sometimes. Patient is on gabapentin 300 mg 3 times a day without any side effects. Denies any weakness.    Past medical history, social, surgical and family history all reviewed in electronic medical record.   Review of Systems: No headache, visual changes, nausea, vomiting, diarrhea, constipation, dizziness, abdominal pain, skin rash, fevers, chills, night sweats, weight loss, swollen lymph nodes, body aches, joint swelling, muscle aches, chest pain, shortness of breath, mood changes.   Objective Blood pressure 140/80, pulse 65, temperature 97.9 F (36.6 C), temperature source Oral, height 5' 3.5" (1.613 m), weight 269 lb (122.018 kg), SpO2 95.00%.  General: No apparent distress alert and oriented x3 mood and affect normal, dressed appropriately.  HEENT: Pupils equal, extraocular movements intact  Respiratory: Patient's speak in full sentences and does not appear short of breath  Cardiovascular: 2+ lower extremity edema, non tender, no erythema  Skin: Warm dry intact with no signs of infection or rash on extremities or on axial skeleton.  Abdomen: Soft nontender  Neuro: Cranial nerves II  through XII are intact, neurovascularly intact in all extremities with 2+ DTRs and 2+ pulses.  Lymph: No lymphadenopathy of posterior or anterior cervical chain or axillae bilaterally.  Gait normal with good balance and coordination.  MSK:  Non tender with full range of motion and good stability and symmetric strength and tone of shoulders, elbows, wrist, hip, knee and ankles bilaterally.  Foot Exam shows the patient does have decreased swelling in the ankles.  Patient does have overpronation hindfoot bilaterally significantly. Patient does have some breakdown of the longitudinal as well as the transverse arch causing splaying between the first and second toes bilaterally right greater than left. Patient does have hammering of the third fourth and fifth toes bilaterally     Impression and Recommendations:

## 2013-10-18 NOTE — Progress Notes (Signed)
Subjective:    Patient ID: Brittany Acosta, female    DOB: 17-Apr-1950, 63 y.o.   MRN: [id number]  HPI Pt returns for f/u of insulin-requiring DM (dx'ed 2002, on a routine blood test; she has moderate neuropathy of the lower extremities; no associated chronic complications; she has been on insulin since 2004; in 2012, she was changed to a simple BID insulin regimen, after she did not get good control on multiple daily injections; she says she can only afford inexpensive human insulin; she has never had severe hypoglycemia, pancreatitis, or DKA; she declines weight-loss surgery).  Chief complaint is chronic neuropathic leg pain, which is limiting her exercise.  She takes insulin, 40 units each morning, and 30 units each evening.  she brings a record of her cbg's which i have reviewed today.  It varies from 70-200, but most are in the 100's.    Past Medical History  Diagnosis Date  . GOITER, MULTINODULAR   . HYPOTHYROIDISM   . HYPERCHOLESTEROLEMIA   . Morbid obesity   . BIPOLAR DISORDER UNSPECIFIED     depression  . Chronic diastolic heart failure   . ALLERGIC RHINITIS   . FIBROMYALGIA   . GERD (gastroesophageal reflux disease)   . History of wrist fracture     rt wrist  . Depression   . Type II diabetes mellitus   . Neuropathy due to secondary diabetes   . Iron deficiency anemia   . Kidney stones     sees urologist @ [place]  . History of gout     "haven't had it in several years" (02/08/2012)  . PTSD (post-traumatic stress disorder)   . Wears glasses   . Sleep apnea 7/14    mild osa-did not need cpap -dr Gwenette Greet  . PONV (postoperative nausea and vomiting)   . RBBB     Past Surgical History  Procedure Laterality Date  . Cholecystectomy  1985  . Abdominal hysterectomy  1976  . Tubal ligation  1972  . Right nasal surgery  08/1988  . Right sinus removed  08/1989    tooth partial  . Percitania stone removed (l) kidney  1992  . Lithotripsy (l) kidney  1997  . Left cystoscopy   1990    . Thyroidectomy  04/22/2011    Procedure: THYROIDECTOMY;  Surgeon: Onnie Graham, MD;  Location: [place];  Service: ENT;  Laterality: N/A;  TOTAL THYROIDECOTMY  . Cataract extraction, bilateral  02/2011    epps  . Total knee arthroplasty  06/18/2011    Procedure: TOTAL KNEE ARTHROPLASTY;  Surgeon: Kerin Salen, MD;  Location: [facility];  Service: Orthopedics;  Laterality: Left;  DEPUY  . Total knee arthroplasty  02/07/2012    Procedure: TOTAL KNEE ARTHROPLASTY;  Surgeon: Kerin Salen, MD;  Location: [facility];  Service: Orthopedics;  Laterality: Right;  . Incisional breast biopsy  2000    right  . Cardiac catheterization  2001    sees Dr Peter Martinique  . Knee arthroscopy  08/2009    right  . Knee arthroscopy with medial menisectomy      left  . Excisional hemorrhoidectomy      "dr cut out in his office" (02/08/2012)  . Left oophorectomy  1980  . Rotator cuff repair  2013    right shoulder x 2  . Joint replacement      rt knee  . Partial mastectomy with needle localization Left 09/01/2012    Procedure: PARTIAL MASTECTOMY WITH NEEDLE LOCALIZATION;  Surgeon: Adin Hector, MD;  Location: [facility];  Service: General;  Laterality: Left;  . Cataract extraction  2014  . Shoulder arthroscopy with rotator cuff repair and subacromial decompression Left 08/20/2013    Procedure: SHOULDER ARTHROSCOPY  AND SUBACROMIAL DECOMPRESSION;  Surgeon: Nita Sells, MD;  Location: [facility];  Service: Orthopedics;  Laterality: Left;  Left shoulder arthroscopy, debridement, subacromial decompression, distal clavical resection    History   Social History  . Marital Status: Married    Spouse Name: N/A    Number of Children: 2  . Years of Education: N/A   Occupational History  . disability    Social History Main Topics  . Smoking status: Former Smoker -- 1.00 packs/day for 6 years    Types: Cigarettes    Quit date: 04/06/1983  . Smokeless tobacco: Never Used     Comment: Married,  lives with spouse. Disable- 2 grown kids-6 g-kids  . Alcohol Use: No     Comment: none  . Drug Use: No  . Sexual Activity: Not Currently   Other Topics Concern  . Not on file   Social History Narrative   Married, lives with spouse - 2 adult children   diabled    Current Outpatient Prescriptions on File Prior to Visit  Medication Sig Dispense Refill  . Alpha-Lipoic Acid 300 MG TABS Take 300 mg by mouth daily.       . ARIPiprazole (ABILIFY) 20 MG tablet Take 10 mg by mouth every morning.       Marland Kitchen aspirin 81 MG tablet Take 81 mg by mouth daily.      Marland Kitchen atorvastatin (LIPITOR) 10 MG tablet Take 1 tablet (10 mg total) by mouth daily.  90 tablet  3  . B-D UF III MINI PEN NEEDLES 31G X 5 MM MISC USE AND DISCARD 1 PEN      NEEDLE TWO TIMES A DAY  90 each  6  . Biotin 10 MG CAPS Take 1 capsule by mouth daily.      . Calcium Carbonate (CALCIUM 600 PO) Take 2 tablets by mouth daily.      . Cholecalciferol (VITAMIN D) 2000 UNITS CAPS Take 1 capsule by mouth daily.      . clonazePAM (KLONOPIN) 1 MG tablet Take 2 mg by mouth at bedtime.       . Coenzyme Q10 120 MG CAPS Take 120 mg by mouth daily.      Marland Kitchen docusate sodium (COLACE) 100 MG capsule Take 1 capsule (100 mg total) by mouth 3 (three) times daily as needed.  20 capsule  0  . doxepin (SINEQUAN) 10 MG capsule Take 10 mg by mouth at bedtime.       Marland Kitchen estradiol (ESTRACE) 0.5 MG tablet TAKE 1 TABLET DAILY  90 tablet  1  . furosemide (LASIX) 40 MG tablet Take 1 tablet (40 mg total) by mouth daily.  90 tablet  3  . hydrochlorothiazide 25 MG tablet Take 25 mg by mouth every morning.       . insulin NPH Human (HUMULIN N,NOVOLIN N) 100 UNIT/ML injection 45 units each morning, and 35 units each evening.      . lamoTRIgine (LAMICTAL) 200 MG tablet Take 200 mg by mouth daily at 12 noon.       Marland Kitchen levothyroxine (SYNTHROID) 50 MCG tablet TAKE 1 TABLET DAILY  90 tablet  1  . Magnesium 200 MG TABS Take 1 tablet by mouth daily.      Marland Kitchen  MANGANESE PO Take by mouth.  40mg  daily.      . Multiple Vitamin (MULTIVITAMIN WITH MINERALS) TABS Take 1 tablet by mouth every evening.      . NON FORMULARY Tumeric-720mg  2 times daily.      Marland Kitchen omeprazole (PRILOSEC) 20 MG capsule TAKE 1 CAPSULE TWICE DAILY  180 capsule  3  . oxybutynin (DITROPAN-XL) 10 MG 24 hr tablet Take 10 mg by mouth every evening.       Marland Kitchen oxyCODONE-acetaminophen (ROXICET) 5-325 MG per tablet Take 1-2 tablets by mouth every 4 (four) hours as needed for severe pain.  60 tablet  0  . potassium citrate (UROCIT-K 10) 10 MEQ (1080 MG) SR tablet Take 20 mEq by mouth 2 (two) times daily.       . propranolol (INDERAL) 40 MG tablet Take 1 tablet (40 mg total) by mouth 2 (two) times daily.  60 tablet  11  . Thiamine HCl (VITAMIN B-1) 100 MG tablet Take 100 mg by mouth every evening.       . Vortioxetine HBr (BRINTELLIX) 10 MG TABS Take 1 tablet by mouth every morning. 20 mg      . gabapentin (NEURONTIN) 300 MG capsule Take 2 capsules (600 mg total) by mouth 3 (three) times daily.  180 capsule  5  . prazosin (MINIPRESS) 2 MG capsule Take 3 mg by mouth at bedtime.       No current facility-administered medications on file prior to visit.    No Known Allergies  Family History  Problem Relation Age of Onset  . Hypertension Mother   . Diabetes Father   . Hypertension Father   . Hyperlipidemia Father   . Heart attack Other   . Coronary artery disease Other     BP 114/64  Pulse 67  Temp(Src) 97.7 F (36.5 C) (Oral)  Ht 5\' 3"  (1.6 m)  Wt 269 lb (122.018 kg)  BMI 47.66 kg/m2  SpO2 95%  Review of Systems She denies hypoglycemia and weight change.     Objective:   Physical Exam VITAL SIGNS:  See vs page.   GENERAL: no distress. Pulses: dorsalis pedis intact bilat.  Feet: no deformity. feet are of normal color and temp. 1+ bilat leg edema.   Skin: no ulcer on the feet.  Neuro: sensation is intact to touch on the feet, but decreased from normal.   Lab Results  Component Value Date   HGBA1C 9.1*  10/18/2013       Assessment & Plan:  DM: severe exacerbation.  Fibromyalgia: this and neuropathy limit exercise of DM. This impairs her ability to achieve glycemic control.  I'll work around this as best I can.     Patient is advised the following: Patient Instructions  Please come back for a follow-up appointment in 3 months.  on this type of insulin, you should eat lunch no later than 4 hrs after breakfast.  Otherwise, your blood sugar could go low.   check your blood sugar twice a day.  vary the time of day when you check, between before the 3 meals, and at bedtime.  also check if you have symptoms of your blood sugar being too high or too low.  please keep a record of the readings and bring it to your next appointment here.  please call us sooner if your blood sugar goes below 70, or if it stays over 200.   blood tests are being requested for you today.  We'll contact you with results.

## 2013-10-18 NOTE — Assessment & Plan Note (Signed)
Patient's the pain is improving as long as she wears the custom orthotic. We discussed continuing this on a regular basis. Patient knows that she can switch to different shoes and will try to do this more regularly. She can followup on an as-needed basis.

## 2013-10-18 NOTE — Patient Instructions (Signed)
Please come back for a follow-up appointment in 3 months.  on this type of insulin, you should eat lunch no later than 4 hrs after breakfast.  Otherwise, your blood sugar could go low.   check your blood sugar twice a day.  vary the time of day when you check, between before the 3 meals, and at bedtime.  also check if you have symptoms of your blood sugar being too high or too low.  please keep a record of the readings and bring it to your next appointment here.  please call us sooner if your blood sugar goes below 70, or if it stays over 200.   blood tests are being requested for you today.  We'll contact you with results.

## 2013-10-18 NOTE — Progress Notes (Signed)
Pre visit review using our clinic review tool, if applicable. No additional management support is needed unless otherwise documented below in the visit note. 

## 2013-10-18 NOTE — Assessment & Plan Note (Signed)
Patient given a trial of B12 supplementations that I think will be beneficial. In addition this will increase patient's gabapentin to 600 mg at night and continue 300 mg in the morning as well as the afternoon. We'll see if this makes any improvement.

## 2013-10-19 ENCOUNTER — Telehealth: Payer: Self-pay | Admitting: Family Medicine

## 2013-10-19 MED ORDER — CYANOCOBALAMIN-SALCAPROZATE 1000-100 MCG-MG PO TABS: 1.0000 | ORAL_TABLET | Freq: Every day | ORAL | Status: DC

## 2013-10-19 NOTE — Telephone Encounter (Signed)
Please call and tell her done.

## 2013-10-19 NOTE — Telephone Encounter (Signed)
Patient informed script sent in as requested.

## 2013-10-19 NOTE — Telephone Encounter (Signed)
Patient would like script for b 12 call in to [facility] (dr. Tamala Julian patient).

## 2013-11-02 ENCOUNTER — Ambulatory Visit (INDEPENDENT_AMBULATORY_CARE_PROVIDER_SITE_OTHER): Payer: Medicare Other | Admitting: Physician Assistant

## 2013-11-02 ENCOUNTER — Encounter: Payer: Self-pay | Admitting: Physician Assistant

## 2013-11-02 VITALS — BP 140/76 | HR 65 | Temp 97.6°F | Resp 20 | Ht 63.0 in | Wt 273.2 lb

## 2013-11-02 DIAGNOSIS — R3989 Other symptoms and signs involving the genitourinary system: Secondary | ICD-10-CM

## 2013-11-02 DIAGNOSIS — R399 Unspecified symptoms and signs involving the genitourinary system: Secondary | ICD-10-CM | POA: Insufficient documentation

## 2013-11-02 DIAGNOSIS — G43909 Migraine, unspecified, not intractable, without status migrainosus: Secondary | ICD-10-CM

## 2013-11-02 LAB — POCT URINALYSIS DIPSTICK
Bilirubin, UA: NEGATIVE
Blood, UA: NEGATIVE
Ketones, UA: NEGATIVE
Leukocytes, UA: NEGATIVE
Nitrite, UA: NEGATIVE
Protein, UA: NEGATIVE
Spec Grav, UA: <=1.005
Urobilinogen, UA: 0.2
pH, UA: 7

## 2013-11-02 MED ORDER — CIPROFLOXACIN HCL 500 MG PO TABS: 500.0000 mg | ORAL_TABLET | Freq: Two times a day (BID) | ORAL | Status: DC

## 2013-11-02 NOTE — Progress Notes (Signed)
Pre visit review using our clinic review tool, if applicable. No additional management support is needed unless otherwise documented below in the visit note/SLS  

## 2013-11-02 NOTE — Patient Instructions (Signed)
Please take antibiotic as directed.  Stay well hydrated. Please take a daily probiotic (Align, Culturelle, Activia Yogurt).  I also recommend a cranberry supplement daily for good urinary tract health.  I will call you with your urine culture results. Please take all medications, especially insulin and blood pressure medications as directed.  If symptoms acutely worsen over the weekend, please proceed to an Urgent Care facility.  I hope you feel better.

## 2013-11-02 NOTE — Progress Notes (Signed)
Patient presents to clinic today c/o 3 days of dysuria, urgency , frequency and LBP. Denies fever, chills, N/V, hematuria.  Is a Type I diabetic.  Endorses taking medications as directed.  AM glucose around 140.  Denies vaginal pain, pruritus or discharge.   Past Medical History  Diagnosis Date  . GOITER, MULTINODULAR   . HYPOTHYROIDISM   . HYPERCHOLESTEROLEMIA   . Morbid obesity   . BIPOLAR DISORDER UNSPECIFIED     depression  . Chronic diastolic heart failure   . ALLERGIC RHINITIS   . FIBROMYALGIA   . GERD (gastroesophageal reflux disease)   . History of wrist fracture     rt wrist  . Depression   . Type II diabetes mellitus   . Neuropathy due to secondary diabetes   . Iron deficiency anemia   . Kidney stones     sees urologist @ [facility]  . History of gout     "haven't had it in several years" (02/08/2012)  . PTSD (post-traumatic stress disorder)   . Wears glasses   . Sleep apnea 7/14    mild osa-did not need cpap -dr Gwenette Greet  . PONV (postoperative nausea and vomiting)   . RBBB     Current Outpatient Prescriptions on File Prior to Visit  Medication Sig Dispense Refill  . Alpha-Lipoic Acid 300 MG TABS Take 300 mg by mouth daily.       . ARIPiprazole (ABILIFY) 20 MG tablet Take 10 mg by mouth every morning.       Marland Kitchen aspirin 81 MG tablet Take 81 mg by mouth daily.      Marland Kitchen atorvastatin (LIPITOR) 10 MG tablet Take 1 tablet (10 mg total) by mouth daily.  90 tablet  3  . B-D UF III MINI PEN NEEDLES 31G X 5 MM MISC USE AND DISCARD 1 PEN      NEEDLE TWO TIMES A DAY  90 each  6  . Biotin 10 MG CAPS Take 1 capsule by mouth daily.      . Calcium Carbonate (CALCIUM 600 PO) Take 2 tablets by mouth daily.      . Cholecalciferol (VITAMIN D) 2000 UNITS CAPS Take 1 capsule by mouth daily.      . clonazePAM (KLONOPIN) 1 MG tablet Take 2 mg by mouth at bedtime.       . Coenzyme Q10 120 MG CAPS Take 120 mg by mouth daily.      . Cyanocobalamin-Salcaprozate (ELIGEN B12) 1000-100 MCG-MG TABS Take 1  tablet by mouth daily.  30 tablet  3  . docusate sodium (COLACE) 100 MG capsule Take 1 capsule (100 mg total) by mouth 3 (three) times daily as needed.  20 capsule  0  . doxepin (SINEQUAN) 10 MG capsule Take 10 mg by mouth at bedtime.       Marland Kitchen estradiol (ESTRACE) 0.5 MG tablet TAKE 1 TABLET DAILY  90 tablet  1  . furosemide (LASIX) 40 MG tablet Take 1 tablet (40 mg total) by mouth daily.  90 tablet  3  . gabapentin (NEURONTIN) 300 MG capsule Take 2 capsules (600 mg total) by mouth 3 (three) times daily.  180 capsule  5  . hydrochlorothiazide 25 MG tablet Take 25 mg by mouth every morning.       . insulin NPH Human (HUMULIN N,NOVOLIN N) 100 UNIT/ML injection 45 units each morning, and 35 units each evening.      . lamoTRIgine (LAMICTAL) 200 MG tablet Take 200 mg by mouth daily at  12 noon.       Marland Kitchen levothyroxine (SYNTHROID) 50 MCG tablet TAKE 1 TABLET DAILY  90 tablet  1  . Magnesium 200 MG TABS Take 1 tablet by mouth daily.      Marland Kitchen MANGANESE PO Take by mouth. 2m daily.      . Multiple Vitamin (MULTIVITAMIN WITH MINERALS) TABS Take 1 tablet by mouth every evening.      . NON FORMULARY Tumeric-7231m2 times daily.      . Marland Kitchenmeprazole (PRILOSEC) 20 MG capsule TAKE 1 CAPSULE TWICE DAILY  180 capsule  3  . oxybutynin (DITROPAN-XL) 10 MG 24 hr tablet Take 10 mg by mouth every evening.       . Marland KitchenxyCODONE-acetaminophen (ROXICET) 5-325 MG per tablet Take 1-2 tablets by mouth every 4 (four) hours as needed for severe pain.  60 tablet  0  . potassium citrate (UROCIT-K 10) 10 MEQ (1080 MG) SR tablet Take 20 mEq by mouth 2 (two) times daily.       . prazosin (MINIPRESS) 2 MG capsule Take 3 mg by mouth at bedtime.      . propranolol (INDERAL) 40 MG tablet Take 1 tablet (40 mg total) by mouth 2 (two) times daily.  60 tablet  11  . Thiamine HCl (VITAMIN B-1) 100 MG tablet Take 100 mg by mouth every evening.       . Vortioxetine HBr (BRINTELLIX) 10 MG TABS Take 1 tablet by mouth every morning. 20 mg       No  current facility-administered medications on file prior to visit.    No Known Allergies  Family History  Problem Relation Age of Onset  . Hypertension Mother   . Diabetes Father   . Hypertension Father   . Hyperlipidemia Father   . Heart attack Other   . Coronary artery disease Other     History   Social History  . Marital Status: Married    Spouse Name: N/A    Number of Children: 2  . Years of Education: N/A   Occupational History  . disability    Social History Main Topics  . Smoking status: Former Smoker -- 1.00 packs/day for 6 years    Types: Cigarettes    Quit date: 04/06/1983  . Smokeless tobacco: Never Used     Comment: Married, lives with spouse. Disable- 2 grown kids-6 g-kids  . Alcohol Use: No     Comment: none  . Drug Use: No  . Sexual Activity: Not Currently   Other Topics Concern  . None   Social History Narrative   Married, lives with spouse - 2 adult children   diabled   Review of Systems - See HPI.  All other ROS are negative.  BP 192/84  Pulse 65  Temp(Src) 97.6 F (36.4 C) (Oral)  Resp 20  Ht '5\' 3"'  (1.6 m)  Wt 273 lb 4 oz (123.945 kg)  BMI 48.42 kg/m2  SpO2 98%  Physical Exam  Constitutional: She is well-developed, well-nourished, and in no distress.  HENT:  Head: Normocephalic and atraumatic.  Eyes: Conjunctivae are normal.  Neck: Neck supple.  Cardiovascular: Normal rate, regular rhythm, normal heart sounds and intact distal pulses.   Pulmonary/Chest: Effort normal and breath sounds normal. No respiratory distress. She has no wheezes. She has no rales. She exhibits no tenderness.  Negative for CVA tenderness.  Abdominal: Soft. Bowel sounds are normal. She exhibits no distension and no mass. There is no tenderness. There is no rebound and  no guarding.  Skin: Skin is warm and dry. No rash noted.    Recent Results (from the past 2160 hour(s))  TSH     Status: None   Collection Time    08/06/13 11:13 AM      Result Value Ref  Range   TSH 3.09  0.35 - 5.50 uIU/mL  BASIC METABOLIC PANEL     Status: Abnormal   Collection Time    08/17/13  9:45 AM      Result Value Ref Range   Sodium 136 (*) 137 - 147 mEq/L   Potassium 4.3  3.7 - 5.3 mEq/L   Chloride 97  96 - 112 mEq/L   CO2 30  19 - 32 mEq/L   Glucose, Bld 382 (*) 70 - 99 mg/dL   BUN 15  6 - 23 mg/dL   Creatinine, Ser 0.74  0.50 - 1.10 mg/dL   Calcium 9.0  8.4 - 10.5 mg/dL   GFR calc non Af Amer 89 (*) >90 mL/min   GFR calc Af Amer >90  >90 mL/min   Comment: (NOTE)     The eGFR has been calculated using the CKD EPI equation.     This calculation has not been validated in all clinical situations.     eGFR's persistently <90 mL/min signify possible Chronic Kidney     Disease.  GLUCOSE, CAPILLARY     Status: Abnormal   Collection Time    08/20/13 12:00 PM      Result Value Ref Range   Glucose-Capillary 143 (*) 70 - 99 mg/dL  POCT HEMOGLOBIN-HEMACUE     Status: None   Collection Time    08/20/13 12:02 PM      Result Value Ref Range   Hemoglobin 13.7  12.0 - 15.0 g/dL  GLUCOSE, CAPILLARY     Status: Abnormal   Collection Time    08/20/13  4:28 PM      Result Value Ref Range   Glucose-Capillary 139 (*) 70 - 99 mg/dL  HEMOGLOBIN A1C     Status: Abnormal   Collection Time    10/18/13  9:36 AM      Result Value Ref Range   Hemoglobin A1C 9.1 (*) 4.6 - 6.5 %   Comment: Glycemic Control Guidelines for People with Diabetes:Non Diabetic:  <6%Goal of Therapy: <7%Additional Action Suggested:  >8%     Assessment/Plan: UTI symptoms Urine dip unremarkable for infection.  Will send for culture.  Giving symptoms fit typical pattern for UTI, and patient's increases risk for UTI, will empirically treat with Cipro.  Stay well-hydrated.  Probiotic.  Cranberry supplement.  Will alter or d/rc therapy if indicated by culture.

## 2013-11-02 NOTE — Assessment & Plan Note (Addendum)
Urine dip unremarkable for infection.  Will send for culture.  Giving symptoms fit typical pattern for UTI, and patient's increases risk for UTI, will empirically treat with Cipro.  Stay well-hydrated.  Probiotic.  Cranberry supplement.  Will alter or d/rc therapy if indicated by culture.

## 2013-11-04 LAB — CULTURE, URINE COMPREHENSIVE
Colony Count: NO GROWTH
Organism ID, Bacteria: NO GROWTH

## 2013-11-05 ENCOUNTER — Ambulatory Visit: Payer: Medicare Other | Admitting: Family Medicine

## 2013-11-14 ENCOUNTER — Encounter: Payer: Self-pay | Admitting: Family Medicine

## 2013-11-14 ENCOUNTER — Encounter: Payer: Self-pay | Admitting: Internal Medicine

## 2013-11-14 ENCOUNTER — Ambulatory Visit (INDEPENDENT_AMBULATORY_CARE_PROVIDER_SITE_OTHER): Payer: Medicare Other | Admitting: Internal Medicine

## 2013-11-14 ENCOUNTER — Telehealth: Payer: Self-pay | Admitting: Internal Medicine

## 2013-11-14 ENCOUNTER — Ambulatory Visit (INDEPENDENT_AMBULATORY_CARE_PROVIDER_SITE_OTHER): Payer: Medicare Other | Admitting: Family Medicine

## 2013-11-14 VITALS — BP 140/78 | HR 68 | Ht 63.0 in | Wt 271.0 lb

## 2013-11-14 VITALS — BP 140/78 | HR 68 | Temp 97.8°F | Ht 63.0 in | Wt 271.0 lb

## 2013-11-14 DIAGNOSIS — F319 Bipolar disorder, unspecified: Secondary | ICD-10-CM

## 2013-11-14 DIAGNOSIS — T07XXXA Unspecified multiple injuries, initial encounter: Secondary | ICD-10-CM

## 2013-11-14 DIAGNOSIS — R296 Repeated falls: Secondary | ICD-10-CM

## 2013-11-14 DIAGNOSIS — Z9181 History of falling: Secondary | ICD-10-CM

## 2013-11-14 DIAGNOSIS — S40029A Contusion of unspecified upper arm, initial encounter: Secondary | ICD-10-CM | POA: Insufficient documentation

## 2013-11-14 DIAGNOSIS — G609 Hereditary and idiopathic neuropathy, unspecified: Secondary | ICD-10-CM

## 2013-11-14 DIAGNOSIS — R269 Unspecified abnormalities of gait and mobility: Secondary | ICD-10-CM

## 2013-11-14 DIAGNOSIS — M25579 Pain in unspecified ankle and joints of unspecified foot: Secondary | ICD-10-CM

## 2013-11-14 DIAGNOSIS — R29818 Other symptoms and signs involving the nervous system: Secondary | ICD-10-CM

## 2013-11-14 DIAGNOSIS — R2689 Other abnormalities of gait and mobility: Secondary | ICD-10-CM

## 2013-11-14 DIAGNOSIS — M949 Disorder of cartilage, unspecified: Secondary | ICD-10-CM

## 2013-11-14 DIAGNOSIS — T148XXA Other injury of unspecified body region, initial encounter: Secondary | ICD-10-CM

## 2013-11-14 DIAGNOSIS — M8580 Other specified disorders of bone density and structure, unspecified site: Secondary | ICD-10-CM

## 2013-11-14 DIAGNOSIS — M899 Disorder of bone, unspecified: Secondary | ICD-10-CM

## 2013-11-14 DIAGNOSIS — G629 Polyneuropathy, unspecified: Secondary | ICD-10-CM

## 2013-11-14 DIAGNOSIS — R2681 Unsteadiness on feet: Secondary | ICD-10-CM | POA: Insufficient documentation

## 2013-11-14 NOTE — Progress Notes (Signed)
  [facility] [address] [place], [place] [address] Phone: [phone] Subjective:     CC: foot pain bilaterally, recent fall  [id number] Brittany Acosta is a 63 y.o. female coming in with complaint of left ankle pain and bilateral foot pain. Patient does have significant osteophytic changes of the ankles bilaterally as well as peripheral neuropathy and fibromyalgia is likely contributing to her pain. Patient did have custom orthotics. Patient though it has not been changing shoes with the orthotics but to do well he continues to wear sandals. Discuss that the foot pain is improving with B12 supplementation. Patient states that the pain seems to be tolerable  Patient though has had a recent falls lately. Patient did fall and unfortunately fractured her wrist and she is seeing another provider for this. Patient states that her arm still hurts above where the fracture is secondary to bruising. States that it is improving slowly but would like to have it looked at. He is able to lift arm and denies any significant shoulder pain. Patient does not know the reason a fall. Patient had another fall where she fell on her knees bilaterally. Patient is taking gabapentin 600 mg 3 times a day we tried to decrease her to 300 in the morning and afternoon in 600mg .       Past medical history, social, surgical and family history all reviewed in electronic medical record.   Review of Systems: No headache, visual changes, nausea, vomiting, diarrhea, constipation, dizziness, abdominal pain, skin rash, fevers, chills, night sweats, weight loss, swollen lymph nodes, body aches, joint swelling, muscle aches, chest pain, shortness of breath, mood changes.   Objective Blood pressure 140/78, pulse 68, height 5\' 3"  (1.6 m), weight 271 lb (122.925 kg), SpO2 95.00%.  General: No apparent distress alert and oriented x3 mood and affect normal, dressed appropriately.  HEENT: Pupils equal,  extraocular movements intact  Respiratory: Patient's speak in full sentences and does not appear short of breath  Cardiovascular: 2+ lower extremity edema, non tender, no erythema  Skin: Warm dry intact with no signs of infection or rash on extremities or on axial skeleton.  Abdomen: Soft nontender  Neuro: Cranial nerves II through XII are intact, neurovascularly intact in all extremities with 2+ DTRs and 2+ pulses.  Lymph: No lymphadenopathy of posterior or anterior cervical chain or axillae bilaterally.  Gait normal with good balance and coordination.  MSK:  Non tender with full range of motion and good stability and symmetric strength and tone of shoulders, elbows, wrist, hip, knee and ankles bilaterally.  Foot Exam shows the patient does have decreased swelling in the ankles.  Patient does have overpronation hindfoot bilaterally significantly. Patient does have some breakdown of the longitudinal as well as the transverse arch causing splaying between the first and second toes bilaterally right greater than left. Patient does have hammering of the third fourth and fifth toes bilaterally  Patient's right arm shows the patient does have a hematoma in the humerus region the soft tissue. Minimally tender to palpation. No redness noted. Patient's bruising seems to be resolving.   Impression and Recommendations:

## 2013-11-14 NOTE — Patient Instructions (Addendum)
You are given the prescription for the [facility] today, to use at all times at home, and when out of the home (can be obtained at [facility] on [address]).  You will be contacted regarding the referral for: Physical Therapy  Please schedule the bone density test before leaving today at the scheduling desk (where you check out)  Please continue all other medications as before, and refills have been done if requested.  Please have the pharmacy call with any other refills you may need.  Please continue your efforts at being more active, low cholesterol diet, and weight control.  Please keep your appointments with your specialists as you may have planned

## 2013-11-14 NOTE — Assessment & Plan Note (Signed)
Patient does have recurrent falls as well as poor balance that could be contributing. I do agree with primary care provider that physical therapy would be warranted. Referral already placed. I do believe that the medications could also be contributing and we'll decrease her gabapentin again to 300, 300, 600.  The patient cannot tolerate this she will drop the night Dosepak to 300 and potentially get rid of the afternoon dose. We discussed talking to her psychiatrist about the Abilify at the dose that it is as well. This could also be contributing to her weight gain.

## 2013-11-14 NOTE — Assessment & Plan Note (Signed)
Patient's hematoma seems to be resolving but we will have her do heat and ice pedicle as well as massage. Patient was given some compression to try to make sure that there is no reaccumulation. We discussed that this will likely resolve over the course of the next 2 weeks. More concerning is patient's balance problem and recurrent falls.

## 2013-11-14 NOTE — Progress Notes (Signed)
Subjective:    Patient ID: Brittany Acosta, female    DOB: [birth year], 63 y.o.   MRN: [id number]  HPI Here to f/u, pt of Dr Asa Lente, with c/o fall x 2 recent, first with right wrist fx approx 10 days ago (also bruise to right upper arm), and then more recent 8 days ago with fall with abrasions to both knees. No further falls since then.  Not sure why she fell, but seemed to have something to do with balance.  Is s/p bilat knee replacements, right shoulder rot cuff, left shoulder rot cuff (both from falls per pt), and hx of fx of 5th toe left foot, all assoc with at least 15 falls over just 2 yrs.  Falls have had different character, such as tripping, falling out of bed, or bending over and losing balance. Has hx of DM neuropathy.  Not dizzy, HA, and Pt denies new neurological symptoms such as new headache, or facial or extremity weakness or numbness.  Has hx of migraine.  Pt denies chest pain, increased sob or doe, wheezing, orthopnea, PND, increased LE swelling, palpitations, dizziness or syncope, except some intermittent venous insuff type swelling to legs.  Has gained wt gradually 25 lbs over 1 yr.  Been rec'd to her for wt loss surgury.  States she does not overeat.  Not active at all. Mostly sitting with some light housework.  Denies worsening depressive symptoms but not getting better, chronic, stable but persistent, but no suicidal ideation, or panic; has ongoing anxiety, PTSD.  Sees psychology Dr Aneta Mins, and Dr Casimiro Needle for psychiatry.  Had PT for left shoulder recently.   Most recent a1c over 9 c/w difficult control, has some polys, followed by Dr Loanne Drilling. A1c worse recently with wt gain and less active. Also recent bmet, tsh may 2015 ok. Last DXA with ortho office about 6 yrs - normal per pt. B12 normal sept 2014 Past Medical History  Diagnosis Date  . GOITER, MULTINODULAR   . HYPOTHYROIDISM   . HYPERCHOLESTEROLEMIA   . Morbid obesity   . BIPOLAR DISORDER UNSPECIFIED     depression  . Chronic  diastolic heart failure   . ALLERGIC RHINITIS   . FIBROMYALGIA   . GERD (gastroesophageal reflux disease)   . History of wrist fracture     rt wrist  . Depression   . Type II diabetes mellitus   . Neuropathy due to secondary diabetes   . Iron deficiency anemia   . Kidney stones     sees urologist @ [facility]  . History of gout     "haven't had it in several years" (02/08/2012)  . PTSD (post-traumatic stress disorder)   . Wears glasses   . Sleep apnea 7/14    mild osa-did not need cpap -dr Gwenette Greet  . PONV (postoperative nausea and vomiting)   . RBBB    Past Surgical History  Procedure Laterality Date  . Cholecystectomy  1985  . Abdominal hysterectomy  1976  . Tubal ligation  1972  . Right nasal surgery  08/1988  . Right sinus removed  08/1989    tooth partial  . Percitania stone removed (l) kidney  1992  . Lithotripsy (l) kidney  1997  . Left cystoscopy   1990  . Thyroidectomy  04/22/2011    Procedure: THYROIDECTOMY;  Surgeon: Onnie Graham, MD;  Location: [facility];  Service: ENT;  Laterality: N/A;  TOTAL THYROIDECOTMY  . Cataract extraction, bilateral  02/2011    epps  .  Total knee arthroplasty  06/18/2011    Procedure: TOTAL KNEE ARTHROPLASTY;  Surgeon: Kerin Salen, MD;  Location: [facility];  Service: Orthopedics;  Laterality: Left;  DEPUY  . Total knee arthroplasty  02/07/2012    Procedure: TOTAL KNEE ARTHROPLASTY;  Surgeon: Kerin Salen, MD;  Location: [facility];  Service: Orthopedics;  Laterality: Right;  . Incisional breast biopsy  2000    right  . Cardiac catheterization  2001    sees Dr Peter Martinique  . Knee arthroscopy  08/2009    right  . Knee arthroscopy with medial menisectomy      left  . Excisional hemorrhoidectomy      "dr cut out in his office" (02/08/2012)  . Left oophorectomy  1980  . Rotator cuff repair  2013    right shoulder x 2  . Joint replacement      rt knee  . Partial mastectomy with needle localization Left 09/01/2012    Procedure: PARTIAL MASTECTOMY WITH  NEEDLE LOCALIZATION;  Surgeon: Adin Hector, MD;  Location: [facility];  Service: General;  Laterality: Left;  . Cataract extraction  2014  . Shoulder arthroscopy with rotator cuff repair and subacromial decompression Left 08/20/2013    Procedure: SHOULDER ARTHROSCOPY  AND SUBACROMIAL DECOMPRESSION;  Surgeon: Nita Sells, MD;  Location: [facility];  Service: Orthopedics;  Laterality: Left;  Left shoulder arthroscopy, debridement, subacromial decompression, distal clavical resection    reports that she quit smoking about 30 years ago. Her smoking use included Cigarettes. She has a 6 pack-year smoking history. She has never used smokeless tobacco. She reports that she does not drink alcohol or use illicit drugs. family history includes Coronary artery disease in her other; Diabetes in her father; Heart attack in her other; Hyperlipidemia in her father; Hypertension in her father and mother. No Known Allergies Current Outpatient Prescriptions on File Prior to Visit  Medication Sig Dispense Refill  . Alpha-Lipoic Acid 300 MG TABS Take 300 mg by mouth daily.       . ARIPiprazole (ABILIFY) 20 MG tablet Take 10 mg by mouth every morning.       Marland Kitchen aspirin 81 MG tablet Take 81 mg by mouth daily.      Marland Kitchen atorvastatin (LIPITOR) 10 MG tablet Take 1 tablet (10 mg total) by mouth daily.  90 tablet  3  . B-D UF III MINI PEN NEEDLES 31G X 5 MM MISC USE AND DISCARD 1 PEN      NEEDLE TWO TIMES A DAY  90 each  6  . Biotin 10 MG CAPS Take 1 capsule by mouth daily.      . Calcium Carbonate (CALCIUM 600 PO) Take 2 tablets by mouth daily.      . Cholecalciferol (VITAMIN D) 2000 UNITS CAPS Take 1 capsule by mouth daily.      . clonazePAM (KLONOPIN) 1 MG tablet Take 2 mg by mouth at bedtime.       . Coenzyme Q10 120 MG CAPS Take 120 mg by mouth daily.      . Cyanocobalamin-Salcaprozate (ELIGEN B12) 1000-100 MCG-MG TABS Take 1 tablet by mouth daily.  30 tablet  3  . doxepin (SINEQUAN) 10 MG capsule  Take 10 mg by mouth at bedtime.       Marland Kitchen estradiol (ESTRACE) 0.5 MG tablet TAKE 1 TABLET DAILY  90 tablet  1  . furosemide (LASIX) 40 MG tablet Take 1 tablet (40 mg total) by mouth daily.  90 tablet  3  . gabapentin (NEURONTIN) 300 MG capsule Take 2 capsules (600 mg total) by mouth 3 (three) times daily.  180 capsule  5  . hydrochlorothiazide 25 MG tablet Take 25 mg by mouth every morning.       . insulin NPH Human (HUMULIN N,NOVOLIN N) 100 UNIT/ML injection 45 units each morning, and 35 units each evening.      . lamoTRIgine (LAMICTAL) 200 MG tablet Take 200 mg by mouth daily at 12 noon.       Marland Kitchen levothyroxine (SYNTHROID) 50 MCG tablet TAKE 1 TABLET DAILY  90 tablet  1  . Magnesium 200 MG TABS Take 1 tablet by mouth daily.      Marland Kitchen MANGANESE PO Take by mouth. 40mg  daily.      . Multiple Vitamin (MULTIVITAMIN WITH MINERALS) TABS Take 1 tablet by mouth every evening.      . NON FORMULARY Tumeric-720mg  2 times daily.      Marland Kitchen omeprazole (PRILOSEC) 20 MG capsule TAKE 1 CAPSULE TWICE DAILY  180 capsule  3  . oxybutynin (DITROPAN-XL) 10 MG 24 hr tablet Take 10 mg by mouth every evening.       . potassium citrate (UROCIT-K 10) 10 MEQ (1080 MG) SR tablet Take 20 mEq by mouth 2 (two) times daily.       . prazosin (MINIPRESS) 2 MG capsule Take 3 mg by mouth at bedtime.      . propranolol (INDERAL) 40 MG tablet Take 1 tablet (40 mg total) by mouth 2 (two) times daily.  60 tablet  11  . Thiamine HCl (VITAMIN B-1) 100 MG tablet Take 100 mg by mouth every evening.       . Vortioxetine HBr (BRINTELLIX) 10 MG TABS Take 1 tablet by mouth every morning. 20 mg       No current facility-administered medications on file prior to visit.   Review of Systems  Constitutional: Negative for unusual diaphoresis or other sweats  HENT: Negative for ringing in ear Eyes: Negative for double vision or worsening visual disturbance.  Respiratory: Negative for choking and stridor.   Gastrointestinal: Negative for vomiting or  other signifcant bowel change Genitourinary: Negative for hematuria or decreased urine volume.  Musculoskeletal: Negative for other MSK pain or swelling Skin: Negative for color change and worsening wound.  Neurological: Negative for tremors and numbness other than noted  Psychiatric/Behavioral: Negative for decreased concentration or agitation other than above       Objective:   Physical Exam BP 140/78  Pulse 68  Temp(Src) 97.8 F (36.6 C) (Oral)  Ht 5\' 3"  (1.6 m)  Wt 271 lb (122.925 kg)  BMI 48.02 kg/m2  SpO2 95% VS noted,  Constitutional: Pt appears well-developed, well-nourished./morbid obese HENT: Head: NCAT.  Right Ear: External ear normal.  Left Ear: External ear normal.  Eyes: . Pupils are equal, round, and reactive to light. Conjunctivae and EOM are normal Neck: Normal range of motion. Neck supple.  Cardiovascular: Normal rate and regular rhythm.   Pulmonary/Chest: Effort normal and breath sounds normal.  Abd:  Soft, NT, ND, + BS Neurological: Pt is alert. Not confused , motor grossly intact, decr sens to LT to feet Skin: Skin is warm. No rash Psychiatric: Pt behavior is normal. No agitation. depressed, slowed affect     Assessment & Plan:

## 2013-11-14 NOTE — Patient Instructions (Signed)
Good to see you but we need you to stay on your feet.  For the arm, heat 10 minutes then message then ice for 10 minutes 2 times a day.  Continue to see chandler for the arm.  For the knee try the pennsaid up to 2 times daily.  If you like it I can call it in for you.  Tylenol 500mg  3 times daily.  Decrease gabapentin to 300mg  in AM, 300mg  in PM and 600mg  at night. I think this will help with the falling  Come back in 4 weeks if still having difficulty.

## 2013-11-14 NOTE — Assessment & Plan Note (Signed)
Discuss with following patient need to increase her vitamin D to 4000 units daily

## 2013-11-14 NOTE — Assessment & Plan Note (Signed)
Encourage patient to wear better shoes and try to avoid sandals. Encourage her to wear the orthotics on a more regular basis I think would be beneficial. We discussed the supplementation of Tylenol over-the-counter scheduled that could also be helpful. Patient will try these interventions and come back and see me again in 4 weeks.

## 2013-11-14 NOTE — Progress Notes (Signed)
Pre visit review using our clinic review tool, if applicable. No additional management support is needed unless otherwise documented below in the visit note. 

## 2013-11-14 NOTE — Telephone Encounter (Signed)
Patient came in today and received a prescription for a cane. She is at the place trying to pick one up today and states that they are wanting a new prescription with an ICD-9 code and diagnosis code written on it. They also are needing to know what "type" of cane is needed. Please advise. Patient says to send to fax#: [phone]

## 2013-11-16 NOTE — Telephone Encounter (Signed)
Done hardcopy to jasmine

## 2013-11-18 NOTE — Assessment & Plan Note (Signed)
Also for PT as above

## 2013-11-18 NOTE — Assessment & Plan Note (Signed)
Also for start PT  - pt [facility] [place], just completed right shoulder PT there

## 2013-11-18 NOTE — Assessment & Plan Note (Signed)
Also for DXA  -r/o osteporosis  Note:  Total time for pt hx, exam, review of record with pt in the room, determination of diagnoses and plan for further eval and tx is > 40 min, with over 50% spent in coordination and counseling of patient

## 2013-11-18 NOTE — Assessment & Plan Note (Signed)
Likely etiology for recent worsening gait and balance issue, recent b12 normal, cont Glucose control,  to f/u any worsening symptoms or concerns

## 2013-11-18 NOTE — Assessment & Plan Note (Signed)
F/u psych as planned

## 2013-11-18 NOTE — Assessment & Plan Note (Signed)
For rx for cane at [facility],  to f/u any worsening symptoms or concerns

## 2013-11-19 ENCOUNTER — Ambulatory Visit (INDEPENDENT_AMBULATORY_CARE_PROVIDER_SITE_OTHER)
Admission: RE | Admit: 2013-11-19 | Discharge: 2013-11-19 | Disposition: A | Discharge status: Home / Self Care | Payer: Medicare Other | Source: Ambulatory Visit | Attending: Internal Medicine | Admitting: Internal Medicine

## 2013-11-19 DIAGNOSIS — M899 Disorder of bone, unspecified: Secondary | ICD-10-CM

## 2013-11-19 DIAGNOSIS — T07XXXA Unspecified multiple injuries, initial encounter: Secondary | ICD-10-CM

## 2013-11-19 DIAGNOSIS — Z9181 History of falling: Secondary | ICD-10-CM

## 2013-11-19 DIAGNOSIS — R296 Repeated falls: Secondary | ICD-10-CM

## 2013-11-19 DIAGNOSIS — M8580 Other specified disorders of bone density and structure, unspecified site: Secondary | ICD-10-CM

## 2013-11-19 DIAGNOSIS — T148XXA Other injury of unspecified body region, initial encounter: Secondary | ICD-10-CM

## 2013-11-19 DIAGNOSIS — M949 Disorder of cartilage, unspecified: Secondary | ICD-10-CM

## 2013-11-27 ENCOUNTER — Encounter: Payer: Self-pay | Admitting: Internal Medicine

## 2013-12-06 ENCOUNTER — Other Ambulatory Visit: Payer: Self-pay | Admitting: Internal Medicine

## 2013-12-12 ENCOUNTER — Ambulatory Visit: Payer: Medicare Other | Admitting: Family Medicine

## 2013-12-31 ENCOUNTER — Other Ambulatory Visit: Payer: Self-pay | Admitting: *Deleted

## 2013-12-31 DIAGNOSIS — R51 Headache: Secondary | ICD-10-CM

## 2013-12-31 MED ORDER — PROPRANOLOL HCL 40 MG PO TABS: 40.0000 mg | ORAL_TABLET | Freq: Two times a day (BID) | ORAL | Status: DC

## 2014-01-03 ENCOUNTER — Other Ambulatory Visit: Payer: Self-pay | Admitting: *Deleted

## 2014-01-03 ENCOUNTER — Telehealth: Payer: Self-pay | Admitting: *Deleted

## 2014-01-03 NOTE — Telephone Encounter (Signed)
For patient to make an appt it has been alomost 1 year since last office visit and would need to be seen to make sure medication is working before we do refills again .

## 2014-01-18 ENCOUNTER — Ambulatory Visit: Payer: Medicare Other | Admitting: Family Medicine

## 2014-01-18 ENCOUNTER — Ambulatory Visit: Payer: Medicare Other | Admitting: Endocrinology

## 2014-01-21 ENCOUNTER — Ambulatory Visit: Payer: Medicare Other | Admitting: Endocrinology

## 2014-01-21 ENCOUNTER — Ambulatory Visit: Payer: Medicare Other | Admitting: Family Medicine

## 2014-02-03 ENCOUNTER — Other Ambulatory Visit: Payer: Self-pay | Admitting: Endocrinology

## 2014-02-18 ENCOUNTER — Encounter: Payer: Self-pay | Admitting: Endocrinology

## 2014-02-18 ENCOUNTER — Encounter: Payer: Medicare Other | Admitting: Internal Medicine

## 2014-02-18 ENCOUNTER — Ambulatory Visit (INDEPENDENT_AMBULATORY_CARE_PROVIDER_SITE_OTHER): Payer: Medicare Other | Admitting: Endocrinology

## 2014-02-18 VITALS — BP 136/80 | HR 80 | Temp 97.5°F | Ht 63.0 in | Wt 273.0 lb

## 2014-02-18 DIAGNOSIS — M25579 Pain in unspecified ankle and joints of unspecified foot: Secondary | ICD-10-CM

## 2014-02-18 DIAGNOSIS — E1065 Type 1 diabetes mellitus with hyperglycemia: Secondary | ICD-10-CM

## 2014-02-18 DIAGNOSIS — IMO0002 Reserved for concepts with insufficient information to code with codable children: Secondary | ICD-10-CM

## 2014-02-18 DIAGNOSIS — E1049 Type 1 diabetes mellitus with other diabetic neurological complication: Secondary | ICD-10-CM

## 2014-02-18 DIAGNOSIS — E1041 Type 1 diabetes mellitus with diabetic mononeuropathy: Secondary | ICD-10-CM

## 2014-02-18 LAB — SEDIMENTATION RATE: Sed Rate: 29 mm/hr — ABNORMAL HIGH (ref 0–22)

## 2014-02-18 LAB — MICROALBUMIN / CREATININE URINE RATIO
Creatinine,U: 33.6 mg/dL
Microalb Creat Ratio: 5.4 mg/g (ref 0.0–30.0)
Microalb, Ur: 1.8 mg/dL (ref 0.0–1.9)

## 2014-02-18 LAB — HEMOGLOBIN A1C: Hgb A1c MFr Bld: 8.1 % — ABNORMAL HIGH (ref 4.6–6.5)

## 2014-02-18 NOTE — Progress Notes (Signed)
Subjective:    Patient ID: [facility], female    DOB: [birth year], 63 y.o.   MRN: [id number]  HPI  Pt returns for f/u of diabetes mellitus: DM type: Insulin-requiring type 2 Dx'ed: 0938 Complications: neuropathy Therapy: insulin since 2004 GDM: never DKA: never Severe hypoglycemia: never Pancreatitis: never Other: in 2012, she was changed to a simple BID insulin regimen, after she did not get good control on multiple daily injections; she says she can only afford inexpensive human insulin; she declines weight-loss surgery Interval history: she brings a record of her cbg's which i have reviewed today.  It varies from 78-150.  There is no trend throughout the day.  Pt states 1 month of moderate pain at the ankles, and assoc swelling Past Medical History  Diagnosis Date  . GOITER, MULTINODULAR   . HYPOTHYROIDISM   . HYPERCHOLESTEROLEMIA   . Morbid obesity   . BIPOLAR DISORDER UNSPECIFIED     depression  . Chronic diastolic heart failure   . ALLERGIC RHINITIS   . FIBROMYALGIA   . GERD (gastroesophageal reflux disease)   . History of wrist fracture     rt wrist  . Depression   . Type II diabetes mellitus   . Neuropathy due to secondary diabetes   . Iron deficiency anemia   . Kidney stones     sees urologist @ [facility]  . History of gout     "haven't had it in several years" (02/08/2012)  . PTSD (post-traumatic stress disorder)   . Wears glasses   . Sleep apnea 7/14    mild osa-did not need cpap -dr Gwenette Greet  . PONV (postoperative nausea and vomiting)   . RBBB     Past Surgical History  Procedure Laterality Date  . Cholecystectomy  1985  . Abdominal hysterectomy  1976  . Tubal ligation  1972  . Right nasal surgery  08/1988  . Right sinus removed  08/1989    tooth partial  . Percitania stone removed (l) kidney  1992  . Lithotripsy (l) kidney  1997  . Left cystoscopy   1990  . Thyroidectomy  04/22/2011    Procedure: THYROIDECTOMY;  Surgeon: Onnie Graham, MD;  Location:  [facility];  Service: ENT;  Laterality: N/A;  TOTAL THYROIDECOTMY  . Cataract extraction, bilateral  02/2011    epps  . Total knee arthroplasty  06/18/2011    Procedure: TOTAL KNEE ARTHROPLASTY;  Surgeon: Kerin Salen, MD;  Location: [facility];  Service: Orthopedics;  Laterality: Left;  DEPUY  . Total knee arthroplasty  02/07/2012    Procedure: TOTAL KNEE ARTHROPLASTY;  Surgeon: Kerin Salen, MD;  Location: [facility];  Service: Orthopedics;  Laterality: Right;  . Incisional breast biopsy  2000    right  . Cardiac catheterization  2001    sees Dr Peter Martinique  . Knee arthroscopy  08/2009    right  . Knee arthroscopy with medial menisectomy      left  . Excisional hemorrhoidectomy      "dr cut out in his office" (02/08/2012)  . Left oophorectomy  1980  . Rotator cuff repair  2013    right shoulder x 2  . Joint replacement      rt knee  . Partial mastectomy with needle localization Left 09/01/2012    Procedure: PARTIAL MASTECTOMY WITH NEEDLE LOCALIZATION;  Surgeon: Adin Hector, MD;  Location: [facility];  Service: General;  Laterality: Left;  . Cataract extraction  2014  .  Shoulder arthroscopy with rotator cuff repair and subacromial decompression Left 08/20/2013    Procedure: SHOULDER ARTHROSCOPY  AND SUBACROMIAL DECOMPRESSION;  Surgeon: Nita Sells, MD;  Location: [facility];  Service: Orthopedics;  Laterality: Left;  Left shoulder arthroscopy, debridement, subacromial decompression, distal clavical resection    History   Social History  . Marital Status: Married    Spouse Name: N/A    Number of Children: 2  . Years of Education: N/A   Occupational History  . disability    Social History Main Topics  . Smoking status: Former Smoker -- 1.00 packs/day for 6 years    Types: Cigarettes    Quit date: 04/06/1983  . Smokeless tobacco: Never Used     Comment: Married, lives with spouse. Disable- 2 grown kids-6 g-kids  . Alcohol Use: No     Comment: none  . Drug Use:  No  . Sexual Activity: Not Currently   Other Topics Concern  . Not on file   Social History Narrative   Married, lives with spouse - 2 adult children   diabled    Current Outpatient Prescriptions on File Prior to Visit  Medication Sig Dispense Refill  . Alpha-Lipoic Acid 300 MG TABS Take 300 mg by mouth daily.     . ARIPiprazole (ABILIFY) 20 MG tablet Take 10 mg by mouth every morning.     Marland Kitchen aspirin 81 MG tablet Take 81 mg by mouth daily.    Marland Kitchen atorvastatin (LIPITOR) 10 MG tablet Take 1 tablet (10 mg total) by mouth daily. 90 tablet 3  . B-D UF III MINI PEN NEEDLES 31G X 5 MM MISC USE AND DISCARD 1 PEN      NEEDLE TWO TIMES A DAY 90 each 6  . Biotin 10 MG CAPS Take 1 capsule by mouth daily.    . Calcium Carbonate (CALCIUM 600 PO) Take 2 tablets by mouth daily.    . Cholecalciferol (VITAMIN D) 2000 UNITS CAPS Take 1 capsule by mouth daily.    . clonazePAM (KLONOPIN) 1 MG tablet Take 2 mg by mouth at bedtime.     . Coenzyme Q10 120 MG CAPS Take 120 mg by mouth daily.    . Cyanocobalamin-Salcaprozate (ELIGEN B12) 1000-100 MCG-MG TABS Take 1 tablet by mouth daily. 30 tablet 3  . doxepin (SINEQUAN) 10 MG capsule Take 10 mg by mouth at bedtime.     Marland Kitchen estradiol (ESTRACE) 0.5 MG tablet TAKE 1 TABLET DAILY 90 tablet 1  . furosemide (LASIX) 40 MG tablet Take 1 tablet (40 mg total) by mouth daily. 90 tablet 3  . gabapentin (NEURONTIN) 300 MG capsule Take 2 capsules (600 mg total) by mouth 3 (three) times daily. 180 capsule 5  . hydrochlorothiazide 25 MG tablet Take 25 mg by mouth every morning.     . insulin NPH Human (HUMULIN N,NOVOLIN N) 100 UNIT/ML injection 45 units each morning, and 35 units each evening.    . lamoTRIgine (LAMICTAL) 200 MG tablet Take 200 mg by mouth daily at 12 noon.     . Magnesium 200 MG TABS Take 1 tablet by mouth daily.    Marland Kitchen MANGANESE PO Take by mouth. 40mg  daily.    . Multiple Vitamin (MULTIVITAMIN WITH MINERALS) TABS Take 1 tablet by mouth every evening.    . NON  FORMULARY Tumeric-720mg  2 times daily.    Marland Kitchen omeprazole (PRILOSEC) 20 MG capsule TAKE 1 CAPSULE TWICE DAILY 180 capsule 3  . oxybutynin (DITROPAN-XL) 10 MG 24  hr tablet Take 10 mg by mouth every evening.     . potassium citrate (UROCIT-K 10) 10 MEQ (1080 MG) SR tablet Take 20 mEq by mouth 2 (two) times daily.     . prazosin (MINIPRESS) 2 MG capsule Take 3 mg by mouth at bedtime.    . propranolol (INDERAL) 40 MG tablet Take 1 tablet (40 mg total) by mouth 2 (two) times daily. 60 tablet 11  . SYNTHROID 50 MCG tablet TAKE 1 TABLET DAILY 90 tablet 1  . Thiamine HCl (VITAMIN B-1) 100 MG tablet Take 100 mg by mouth every evening.     . Vortioxetine HBr (BRINTELLIX) 10 MG TABS Take 1 tablet by mouth every morning. 20 mg     No current facility-administered medications on file prior to visit.    No Known Allergies  Family History  Problem Relation Age of Onset  . Hypertension Mother   . Diabetes Father   . Hypertension Father   . Hyperlipidemia Father   . Heart attack Other   . Coronary artery disease Other     BP 136/80 mmHg  Pulse 80  Temp(Src) 97.5 F (36.4 C) (Oral)  Ht 5\' 3"  (1.6 m)  Wt 273 lb (123.832 kg)  BMI 48.37 kg/m2  SpO2 97%    Review of Systems She denies hypoglycemia and weight change.    Objective:   Physical Exam VITAL SIGNS:  See vs page.   GENERAL: no distress.  Pulses: dorsalis pedis intact bilat.  Feet: no deformity. feet are of normal color and temp. 1+ bilat leg edema.   Ankles: nontender.  No warmth or erythema.   Skin: no ulcer on the feet.  Neuro: sensation is intact to touch on the feet, but decreased from normal.        Assessment & Plan:  DM: apparently well-controlled Arthralgias, new, uncertain etiology   Patient is advised the following: Patient Instructions  Please come back for a follow-up appointment in 3 months.  on this type of insulin, you should eat lunch no later than 4 hrs after breakfast.  Otherwise, your blood sugar could  go low.   check your blood sugar twice a day.  vary the time of day when you check, between before the 3 meals, and at bedtime.  also check if you have symptoms of your blood sugar being too high or too low.  please keep a record of the readings and bring it to your next appointment here.  please call us sooner if your blood sugar goes below 70, or if it stays over 200.   blood tests are being requested for you today.  We'll contact you with results.

## 2014-02-18 NOTE — Patient Instructions (Addendum)
Please come back for a follow-up appointment in 3 months.  on this type of insulin, you should eat lunch no later than 4 hrs after breakfast.  Otherwise, your blood sugar could go low.   check your blood sugar twice a day.  vary the time of day when you check, between before the 3 meals, and at bedtime.  also check if you have symptoms of your blood sugar being too high or too low.  please keep a record of the readings and bring it to your next appointment here.  please call us sooner if your blood sugar goes below 70, or if it stays over 200.   blood tests are being requested for you today.  We'll contact you with results.

## 2014-02-19 ENCOUNTER — Encounter: Payer: Self-pay | Admitting: Family Medicine

## 2014-02-19 ENCOUNTER — Ambulatory Visit (INDEPENDENT_AMBULATORY_CARE_PROVIDER_SITE_OTHER): Payer: Medicare Other | Admitting: Family Medicine

## 2014-02-19 VITALS — BP 142/82 | HR 70 | Ht 63.0 in | Wt 273.0 lb

## 2014-02-19 DIAGNOSIS — M7661 Achilles tendinitis, right leg: Secondary | ICD-10-CM

## 2014-02-19 DIAGNOSIS — M6788 Other specified disorders of synovium and tendon, other site: Secondary | ICD-10-CM | POA: Insufficient documentation

## 2014-02-19 LAB — BASIC METABOLIC PANEL
BUN: 12 mg/dL (ref 6–23)
CO2: 29 mEq/L (ref 19–32)
Calcium: 8.9 mg/dL (ref 8.4–10.5)
Chloride: 98 mEq/L (ref 96–112)
Creatinine, Ser: 0.8 mg/dL (ref 0.4–1.2)
GFR: 72.66 mL/min (ref >60.00)
Glucose, Bld: 145 mg/dL — ABNORMAL HIGH (ref 70–99)
Potassium: 3.7 mEq/L (ref 3.5–5.1)
Sodium: 135 mEq/L (ref 135–145)

## 2014-02-19 LAB — LIPID PANEL
Cholesterol: 117 mg/dL (ref 0–200)
HDL: 47.5 mg/dL (ref >39.00)
LDL Cholesterol: 41 mg/dL (ref 0–99)
NonHDL: 69.5
Total CHOL/HDL Ratio: 2
Triglycerides: 142 mg/dL (ref 0.0–149.0)
VLDL: 28.4 mg/dL (ref 0.0–40.0)

## 2014-02-19 LAB — URIC ACID: Uric Acid, Serum: 6.9 mg/dL (ref 2.4–7.0)

## 2014-02-19 NOTE — Patient Instructions (Signed)
Good to see you Ice 20 minutes 2 times daily. Usually after activity and before bed. Exercises 3 times a week.  Exercise on stair, with toes dropping heels as far as you can and then go up on your toes, hold 2 seconds, come down slow for a count of 4 seconds, repeat 30 times daily Wear the new brace and see if that helps See me again in 4 weeks.

## 2014-02-19 NOTE — Progress Notes (Signed)
  [facility] [address] [place], [place] [address] Phone: [phone] Subjective:     CC: foot pain bilaterally, been greater then 2 months since last visit.   Brittany Acosta is a 63 y.o. female coming in with complaint of left ankle pain and bilateral foot pain. Patient does have significant osteophytic changes of the ankles bilaterally as well as peripheral neuropathy and fibromyalgia is likely contributing to her pain. Patient did have custom orthotics. Patient was also found to be a very large dose of gabapentin at last visit and we did attempt to titrate down on the medication. Has not fallen as much. States that she has some discomfort throughout the day. Patient does have more of an Achilles tendinosis as well she states. More pain on the posterior aspect of her heel. States that she saw another provider and was given exercises which has helped minimally. Patient is wondering if there is anything else he can do. Denies any true injury. Denies any radiation into her foot. Denies any fevers or chills or any abnormal weight loss.       Past medical history, social, surgical and family history all reviewed in electronic medical record.   Review of Systems: No headache, visual changes, nausea, vomiting, diarrhea, constipation, dizziness, abdominal pain, skin rash, fevers, chills, night sweats, weight loss, swollen lymph nodes, body aches, joint swelling, muscle aches, chest pain, shortness of breath, mood changes.   Objective Blood pressure 142/82, pulse 70, height 5\' 3"  (1.6 m), weight 273 lb (123.832 kg), SpO2 95 %.  General: No apparent distress alert and oriented x3 mood and affect normal, dressed appropriately.  HEENT: Pupils equal, extraocular movements intact  Respiratory: Patient's speak in full sentences and does not appear short of breath  Cardiovascular: 2+ lower extremity edema, non tender, no erythema  Skin: Warm dry intact with no  signs of infection or rash on extremities or on axial skeleton.  Abdomen: Soft nontender  Neuro: Cranial nerves II through XII are intact, neurovascularly intact in all extremities with 2+ DTRs and 2+ pulses.  Lymph: No lymphadenopathy of posterior or anterior cervical chain or axillae bilaterally.  Gait normal with good balance and coordination.  MSK:  Non tender with full range of motion and good stability and symmetric strength and tone of shoulders, elbows, wrist, hip, knee and ankles bilaterally.  Foot Exam shows the patient does have decreased swelling in the ankles.  Patient does have overpronation hindfoot bilaterally significantly. Patient does have some breakdown of the longitudinal as well as the transverse arch causing splaying between the first and second toes bilaterally right greater than left. Patient does have hammering of the third fourth and fifth toes bilaterally Patient is also tender to palpation at the insertion of the Achilles. No Haglund's nodule noted. Patient does have some trace effusion though noted. Full strength of the calf. Neurovascularly intact distally.  Impression and Recommendations:

## 2014-02-19 NOTE — Assessment & Plan Note (Signed)
Discussed with patient at great length. Patient was given exercises and showed proper technique. We discussed icing protocol. We also discussed different range of motion exercises that I think will be beneficial. We discussed bracing and patient was given a pneumatic compression sleeve today. Patient and will come back and see me again in 3 weeks. Continuing to have difficulty I would consider doing an ultrasound evaluation and discuss formal physical therapy.  Spent greater than 25 minutes with patient face-to-face and had greater than 50% of counseling including as described above in assessment and plan.

## 2014-02-20 ENCOUNTER — Telehealth: Payer: Self-pay | Admitting: Endocrinology

## 2014-02-20 NOTE — Telephone Encounter (Signed)
Patient is calling back for the results of her labs, message on machine was not clear.  Please advise

## 2014-02-20 NOTE — Telephone Encounter (Signed)
Pt advise that A1C was a little high and to please increase insulin to 55 units. Pt voiced understanding,

## 2014-03-04 ENCOUNTER — Telehealth: Payer: Self-pay | Admitting: Endocrinology

## 2014-03-04 MED ORDER — INSULIN ISOPHANE HUMAN 100 UNIT/ML KWIKPEN: PEN_INJECTOR | SUBCUTANEOUS | Status: DC

## 2014-03-04 NOTE — Telephone Encounter (Signed)
Please call in humulin quikpen called into mail order pharmacy [facility]  55 u in am and 35 u in pm

## 2014-03-04 NOTE — Telephone Encounter (Signed)
Rx sent to pharamacy.  

## 2014-03-04 NOTE — Telephone Encounter (Signed)
Patient ask if Dr Loanne Drilling could send in a correct dosage of Humilin quick

## 2014-03-13 ENCOUNTER — Other Ambulatory Visit: Payer: Self-pay | Admitting: Internal Medicine

## 2014-03-22 ENCOUNTER — Ambulatory Visit: Payer: Medicare Other | Admitting: Family Medicine

## 2014-03-25 ENCOUNTER — Telehealth: Payer: Self-pay | Admitting: Internal Medicine

## 2014-03-25 MED ORDER — ATORVASTATIN CALCIUM 10 MG PO TABS: 10.0000 mg | ORAL_TABLET | Freq: Every day | ORAL | Status: DC

## 2014-03-25 NOTE — Addendum Note (Signed)
Addended by: Lowella Dandy on: 03/25/2014 02:08 PM   Modules accepted: Orders

## 2014-03-25 NOTE — Telephone Encounter (Signed)
Erx done.  

## 2014-03-25 NOTE — Telephone Encounter (Signed)
Pt also call to request Atorvastatin to be send there as well.

## 2014-04-08 ENCOUNTER — Ambulatory Visit: Payer: Medicare Other | Admitting: Family Medicine

## 2014-04-10 ENCOUNTER — Other Ambulatory Visit: Payer: Self-pay | Admitting: Endocrinology

## 2014-04-22 ENCOUNTER — Other Ambulatory Visit (INDEPENDENT_AMBULATORY_CARE_PROVIDER_SITE_OTHER): Payer: Medicare Other

## 2014-04-22 ENCOUNTER — Ambulatory Visit (INDEPENDENT_AMBULATORY_CARE_PROVIDER_SITE_OTHER): Payer: Medicare Other | Admitting: Family Medicine

## 2014-04-22 ENCOUNTER — Encounter: Payer: Self-pay | Admitting: Family Medicine

## 2014-04-22 VITALS — BP 134/76 | HR 76 | Ht 63.0 in | Wt 276.0 lb

## 2014-04-22 DIAGNOSIS — M25552 Pain in left hip: Secondary | ICD-10-CM

## 2014-04-22 DIAGNOSIS — M7062 Trochanteric bursitis, left hip: Secondary | ICD-10-CM

## 2014-04-22 NOTE — Progress Notes (Signed)
[facility] [address] [place], [place] [address] Phone: [phone] Subjective:     CC: foot pain bilaterally, follow up [id number] Britt[facility]osta is a 64 y.o. female coming in with complaint of left ankle pain and bilateral foot pain. Patient does have significant osteophytic changes of the ankles bilaterally as well as peripheral neuropathy and fibromyalgia is likely contributing to her pain. Patient did have custom orthotics. Patient was also found to be a very large dose of gabapentin at last visit and we did attempt to titrate down on the medication.  Patient at last visit had more of an Achilles tendinosis. Patient had elected to continue with conservative therapy. Patient states that her ankles are doing significant better.  Patient though states that she is having more pain on the lateral aspect of her hip. Patient states that when she is trying to sleep at night it is severe. Patient states that it hurts more after sitting a long amount of time and then seems give better slowly. Patient denies any significant radiation down the leg but sometimes does. Denies any weakness. Denies any back pain that is associated with it. Rates the severity of pain is 8 out of 10.    Past medical history, social, surgical and family history all reviewed in electronic medical record.   Review of Systems: No headache, visual changes, nausea, vomiting, diarrhea, constipation, dizziness, abdominal pain, skin rash, fevers, chills, night sweats, weight loss, swollen lymph nodes, body aches, joint swelling, muscle aches, chest pain, shortness of breath, mood changes.   Objective Blood pressure 134/76, pulse 76, height 5\' 3"  (1.6 m), weight 276 lb (125.193 kg), SpO2 96 %.  General: No apparent distress alert and oriented x3 mood and affect normal, dressed appropriately.  HEENT: Pupils equal, extraocular movements intact  Respiratory: Patient's speak in full sentences and does  not appear short of breath  Cardiovascular: 2+ lower extremity edema, non tender, no erythema  Skin: Warm dry intact with no signs of infection or rash on extremities or on axial skeleton.  Abdomen: Soft nontender  Neuro: Cranial nerves II through XII are intact, neurovascularly intact in all extremities with 2+ DTRs and 2+ pulses.  Lymph: No lymphadenopathy of posterior or anterior cervical chain or axillae bilaterally.  Gait normal with good balance and coordination.  MSK:  Non tender with full range of motion and good stability and symmetric strength and tone of shoulders, elbows, wrist, , knee and ankles bilaterally.  Foot Exam shows the patient does have decreased swelling in the ankles.  Patient does have overpronation hindfoot bilaterally significantly. Patient does have some breakdown of the longitudinal as well as the transverse arch causing splaying between the first and second toes bilaterally right greater than left. Patient does have hammering of the third fourth and fifth toes bilaterally  No Haglund's nodule noted. Nontender on exam today significant improvement from previous exam.  Hip: ROM IR: 25 Deg, ER: 45 Deg, Flexion: 100 Deg, Extension: 100 Deg, Abduction: 45 Deg, Adduction: 45 Deg Strength IR: 5/5, ER: 5/5, Flexion: 5/5, Extension: 5/5, Abduction: 5/5, Adduction: 5/5 Pelvic alignment unremarkable to inspection and palpation. Standing hip rotation and gait without trendelenburg sign / unsteadiness. Greater trochanter severe tenderness to palpation No tenderness over piriformis and greater trochanter. Positive Faber No SI joint tenderness and normal minimal SI movement.  MSK US performed of: Left This study was ordered, performed, and interpreted by Charlann Boxer D.O.  Hip: Trochanteric bursa with significant hypoechoic  changes and swelling Acetabular labrum visualized and without tears, displacement, or effusion in joint. Femoral neck appears unremarkable without  increased power doppler signal along Cortex.  IMPRESSION:  Greater trochanter bursitis   Procedure: Real-time Ultrasound Guided Injection of left greater trochanteric bursitis secondary to patient's body habitus Device: GE Logiq E  Ultrasound guided injection is preferred based studies that show increased duration, increased effect, greater accuracy, decreased procedural pain, increased response rate, and decreased cost with ultrasound guided versus blind injection.  Verbal informed consent obtained.  Time-out conducted.  Noted no overlying erythema, induration, or other signs of local infection.  Skin prepped in a sterile fashion.  Local anesthesia: Topical Ethyl chloride.  With sterile technique and under real time ultrasound guidance:  Greater trochanteric area was visualized and patient's bursa was noted. A 22-gauge 3 inch needle was inserted and 4 cc of 0.5% Marcaine and 1 cc of Kenalog 40 mg/dL was injected. Pictures taken Completed without difficulty  Pain immediately resolved suggesting accurate placement of the medication.  Advised to call if fevers/chills, erythema, induration, drainage, or persistent bleeding.  Images permanently stored and available for review in the ultrasound unit.  Impression: Technically successful ultrasound guided injection.   Procedure note 24401; 15 minutes spent for Therapeutic exercises as stated in above notes.  This included exercises focusing on stretching, strengthening, with significant focus on eccentric aspects.   Proper technique shown and discussed handout in great detail with ATC.  All questions were discussed and answered.     Impression and Recommendations:

## 2014-04-22 NOTE — Assessment & Plan Note (Signed)
Patient was given an injection today. We discussed icing regimen, home exercises and patient was given topical anti-inflammatories. Patient's underlying other comorbidities are likely going to contribute to some of his pain. Patient will continue with the home exercises and did work with the Systems analyst today. Patient will try to make these changes and come back and see me again in 3-4 weeks for further evaluation.

## 2014-04-22 NOTE — Patient Instructions (Signed)
Good to see you Ice to hip and shoulder before bed.  Try exercises 3 times a week.  In your car roll up a towel or your coat and put it under your thighs.  Try to bring seat up.  Try pennsaid on the shoulder up to 2 times daily.  With lifting think thumbs up or underhand.  See me again in 3-4 weeks.

## 2014-04-30 ENCOUNTER — Encounter: Payer: Medicare Other | Admitting: Internal Medicine

## 2014-05-06 ENCOUNTER — Other Ambulatory Visit: Payer: Self-pay | Admitting: Internal Medicine

## 2014-05-13 ENCOUNTER — Encounter: Payer: Self-pay | Admitting: Endocrinology

## 2014-05-13 ENCOUNTER — Ambulatory Visit (INDEPENDENT_AMBULATORY_CARE_PROVIDER_SITE_OTHER): Payer: Medicare Other | Admitting: Endocrinology

## 2014-05-13 ENCOUNTER — Other Ambulatory Visit (INDEPENDENT_AMBULATORY_CARE_PROVIDER_SITE_OTHER): Payer: Medicare Other

## 2014-05-13 ENCOUNTER — Encounter: Payer: Self-pay | Admitting: Family

## 2014-05-13 ENCOUNTER — Telehealth: Payer: Self-pay | Admitting: Family

## 2014-05-13 ENCOUNTER — Ambulatory Visit (INDEPENDENT_AMBULATORY_CARE_PROVIDER_SITE_OTHER): Payer: Medicare Other | Admitting: Family

## 2014-05-13 VITALS — BP 152/78 | HR 71 | Temp 97.6°F | Resp 18 | Ht 63.0 in | Wt 270.1 lb

## 2014-05-13 VITALS — BP 138/92 | HR 81 | Temp 98.1°F | Ht 63.0 in | Wt 270.0 lb

## 2014-05-13 DIAGNOSIS — E1041 Type 1 diabetes mellitus with diabetic mononeuropathy: Secondary | ICD-10-CM

## 2014-05-13 DIAGNOSIS — E7800 Pure hypercholesterolemia, unspecified: Secondary | ICD-10-CM

## 2014-05-13 DIAGNOSIS — I10 Essential (primary) hypertension: Secondary | ICD-10-CM

## 2014-05-13 DIAGNOSIS — E039 Hypothyroidism, unspecified: Secondary | ICD-10-CM

## 2014-05-13 DIAGNOSIS — R6889 Other general symptoms and signs: Secondary | ICD-10-CM

## 2014-05-13 DIAGNOSIS — E1049 Type 1 diabetes mellitus with other diabetic neurological complication: Secondary | ICD-10-CM

## 2014-05-13 DIAGNOSIS — Z9189 Other specified personal risk factors, not elsewhere classified: Secondary | ICD-10-CM

## 2014-05-13 DIAGNOSIS — Z0000 Encounter for general adult medical examination without abnormal findings: Secondary | ICD-10-CM | POA: Insufficient documentation

## 2014-05-13 DIAGNOSIS — E1065 Type 1 diabetes mellitus with hyperglycemia: Secondary | ICD-10-CM

## 2014-05-13 DIAGNOSIS — Z1331 Encounter for screening for depression: Secondary | ICD-10-CM | POA: Insufficient documentation

## 2014-05-13 DIAGNOSIS — IMO0002 Reserved for concepts with insufficient information to code with codable children: Secondary | ICD-10-CM

## 2014-05-13 LAB — LIPID PANEL
Cholesterol: 135 mg/dL (ref 0–200)
HDL: 65.4 mg/dL (ref >39.00)
LDL Cholesterol: 54 mg/dL (ref 0–99)
NonHDL: 69.6
Total CHOL/HDL Ratio: 2
Triglycerides: 78 mg/dL (ref 0.0–149.0)
VLDL: 15.6 mg/dL (ref 0.0–40.0)

## 2014-05-13 LAB — HEMOGLOBIN A1C: Hgb A1c MFr Bld: 8.1 % — ABNORMAL HIGH (ref 4.6–6.5)

## 2014-05-13 LAB — CBC
HCT: 41.1 % (ref 36.0–46.0)
Hemoglobin: 13.8 g/dL (ref 12.0–15.0)
MCHC: 33.6 g/dL (ref 30.0–36.0)
MCV: 81.4 fl (ref 78.0–100.0)
Platelets: 424 10*3/uL — ABNORMAL HIGH (ref 150.0–400.0)
RBC: 5.05 Mil/uL (ref 3.87–5.11)
RDW: 15.6 % — ABNORMAL HIGH (ref 11.5–15.5)
WBC: 12.2 10*3/uL — ABNORMAL HIGH (ref 4.0–10.5)

## 2014-05-13 LAB — BASIC METABOLIC PANEL
BUN: 21 mg/dL (ref 6–23)
CO2: 32 mEq/L (ref 19–32)
Calcium: 9.4 mg/dL (ref 8.4–10.5)
Chloride: 98 mEq/L (ref 96–112)
Creatinine, Ser: 0.9 mg/dL (ref 0.40–1.20)
GFR: 67.05 mL/min (ref >60.00)
Glucose, Bld: 107 mg/dL — ABNORMAL HIGH (ref 70–99)
Potassium: 4.1 mEq/L (ref 3.5–5.1)
Sodium: 140 mEq/L (ref 135–145)

## 2014-05-13 LAB — TSH: TSH: 2.29 u[IU]/mL (ref 0.35–4.50)

## 2014-05-13 MED ORDER — INSULIN ISOPHANE HUMAN 100 UNIT/ML KWIKPEN: PEN_INJECTOR | SUBCUTANEOUS | Status: DC

## 2014-05-13 NOTE — Progress Notes (Signed)
Pre visit review using our clinic review tool, if applicable. No additional management support is needed unless otherwise documented below in the visit note. 

## 2014-05-13 NOTE — Patient Instructions (Addendum)
Please come back for a follow-up appointment in 3 months.  on this type of insulin, it is really important to eat lunch no later than 4 hrs after breakfast.  Otherwise, your blood sugar could go low.   check your blood sugar twice a day.  vary the time of day when you check, between before the 3 meals, and at bedtime.  also check if you have symptoms of your blood sugar being too high or too low.  please keep a record of the readings and bring it to your next appointment here.  please call us sooner if your blood sugar goes below 70, or if it stays over 200.     please increase the insulin to 70 units each morning, and 30 units each evening.

## 2014-05-13 NOTE — Telephone Encounter (Signed)
Pt aware of results. Sending results in the mail per pts request.

## 2014-05-13 NOTE — Assessment & Plan Note (Addendum)
Obtain A1c.

## 2014-05-13 NOTE — Patient Instructions (Addendum)
Thank you for choosing [facility].  Summary/Instructions:  Please stop by the lab on the basement level of the building for your blood work. Your results will be released to MyChart (or called to you) after review, usually within 72hours after test completion. If any changes need to be made, you will be notified at that same time.  Referrals have been made during this visit. You should expect to hear back from our schedulers in about 7-10 days in regards to establishing an appointment with the specialists we discussed.   If your symptoms worsen or fail to improve, please contact our office for further instruction, or in case of emergency go directly to the emergency room at the closest medical facility.   Health Maintenance Adopting a healthy lifestyle and getting preventive care can go a long way to promote health and wellness. Talk with your health care provider about what schedule of regular examinations is right for you. This is a good chance for you to check in with your provider about disease prevention and staying healthy. In between checkups, there are plenty of things you can do on your own. Experts have done a lot of research about which lifestyle changes and preventive measures are most likely to keep you healthy. Ask your health care provider for more information. WEIGHT AND DIET  Eat a healthy diet  Be sure to include plenty of vegetables, fruits, low-fat dairy products, and lean protein.  Do not eat a lot of foods high in solid fats, added sugars, or salt.  Get regular exercise. This is one of the most important things you can do for your health.  Most adults should exercise for at least 150 minutes each week. The exercise should increase your heart rate and make you sweat (moderate-intensity exercise).  Most adults should also do strengthening exercises at least twice a week. This is in addition to the moderate-intensity exercise.  Maintain a healthy weight  Body  mass index (BMI) is a measurement that can be used to identify possible weight problems. It estimates body fat based on height and weight. Your health care provider can help determine your BMI and help you achieve or maintain a healthy weight.  For females 20 years of age and older:   A BMI below 18.5 is considered underweight.  A BMI of 18.5 to 24.9 is normal.  A BMI of 25 to 29.9 is considered overweight.  A BMI of 30 and above is considered obese.  Watch levels of cholesterol and blood lipids  You should start having your blood tested for lipids and cholesterol at 64 years of age, then have this test every 5 years.  You may need to have your cholesterol levels checked more often if:  Your lipid or cholesterol levels are high.  You are older than 64 years of age.  You are at high risk for heart disease.  CANCER SCREENING   Lung Cancer  Lung cancer screening is recommended for adults 55-80 years old who are at high risk for lung cancer because of a history of smoking.  A yearly low-dose CT scan of the lungs is recommended for people who:  Currently smoke.  Have quit within the past 15 years.  Have at least a 30-pack-year history of smoking. A pack year is smoking an average of one pack of cigarettes a day for 1 year.  Yearly screening should continue until it has been 15 years since you quit.  Yearly screening should stop if you develop   a health problem that would prevent you from having lung cancer treatment.  Breast Cancer  Practice breast self-awareness. This means understanding how your breasts normally appear and feel.  It also means doing regular breast self-exams. Let your health care provider know about any changes, no matter how small.  If you are in your 20s or 30s, you should have a clinical breast exam (CBE) by a health care provider every 1-3 years as part of a regular health exam.  If you are 40 or older, have a CBE every year. Also consider having a  breast X-ray (mammogram) every year.  If you have a family history of breast cancer, talk to your health care provider about genetic screening.  If you are at high risk for breast cancer, talk to your health care provider about having an MRI and a mammogram every year.  Breast cancer gene (BRCA) assessment is recommended for women who have family members with BRCA-related cancers. BRCA-related cancers include:  Breast.  Ovarian.  Tubal.  Peritoneal cancers.  Results of the assessment will determine the need for genetic counseling and BRCA1 and BRCA2 testing. Cervical Cancer Routine pelvic examinations to screen for cervical cancer are no longer recommended for nonpregnant women who are considered low risk for cancer of the pelvic organs (ovaries, uterus, and vagina) and who do not have symptoms. A pelvic examination may be necessary if you have symptoms including those associated with pelvic infections. Ask your health care provider if a screening pelvic exam is right for you.   The Pap test is the screening test for cervical cancer for women who are considered at risk.  If you had a hysterectomy for a problem that was not cancer or a condition that could lead to cancer, then you no longer need Pap tests.  If you are older than 65 years, and you have had normal Pap tests for the past 10 years, you no longer need to have Pap tests.  If you have had past treatment for cervical cancer or a condition that could lead to cancer, you need Pap tests and screening for cancer for at least 20 years after your treatment.  If you no longer get a Pap test, assess your risk factors if they change (such as having a new sexual partner). This can affect whether you should start being screened again.  Some women have medical problems that increase their chance of getting cervical cancer. If this is the case for you, your health care provider may recommend more frequent screening and Pap tests.  The  human papillomavirus (HPV) test is another test that may be used for cervical cancer screening. The HPV test looks for the virus that can cause cell changes in the cervix. The cells collected during the Pap test can be tested for HPV.  The HPV test can be used to screen women 30 years of age and older. Getting tested for HPV can extend the interval between normal Pap tests from three to five years.  An HPV test also should be used to screen women of any age who have unclear Pap test results.  After 64 years of age, women should have HPV testing as often as Pap tests.  Colorectal Cancer  This type of cancer can be detected and often prevented.  Routine colorectal cancer screening usually begins at 64 years of age and continues through 64 years of age.  Your health care provider may recommend screening at an earlier age if you have risk   factors for colon cancer.  Your health care provider may also recommend using home test kits to check for hidden blood in the stool.  A small camera at the end of a tube can be used to examine your colon directly (sigmoidoscopy or colonoscopy). This is done to check for the earliest forms of colorectal cancer.  Routine screening usually begins at age 50.  Direct examination of the colon should be repeated every 5-10 years through 64 years of age. However, you may need to be screened more often if early forms of precancerous polyps or small growths are found. Skin Cancer  Check your skin from head to toe regularly.  Tell your health care provider about any new moles or changes in moles, especially if there is a change in a mole's shape or color.  Also tell your health care provider if you have a mole that is larger than the size of a pencil eraser.  Always use sunscreen. Apply sunscreen liberally and repeatedly throughout the day.  Protect yourself by wearing long sleeves, pants, a wide-brimmed hat, and sunglasses whenever you are outside. HEART  DISEASE, DIABETES, AND HIGH BLOOD PRESSURE   Have your blood pressure checked at least every 1-2 years. High blood pressure causes heart disease and increases the risk of stroke.  If you are between 55 years and 79 years old, ask your health care provider if you should take aspirin to prevent strokes.  Have regular diabetes screenings. This involves taking a blood sample to check your fasting blood sugar level.  If you are at a normal weight and have a low risk for diabetes, have this test once every three years after 64 years of age.  If you are overweight and have a high risk for diabetes, consider being tested at a younger age or more often. PREVENTING INFECTION  Hepatitis B  If you have a higher risk for hepatitis B, you should be screened for this virus. You are considered at high risk for hepatitis B if:  You were born in a country where hepatitis B is common. Ask your health care provider which countries are considered high risk.  Your parents were born in a high-risk country, and you have not been immunized against hepatitis B (hepatitis B vaccine).  You have HIV or AIDS.  You use needles to inject street drugs.  You live with someone who has hepatitis B.  You have had sex with someone who has hepatitis B.  You get hemodialysis treatment.  You take certain medicines for conditions, including cancer, organ transplantation, and autoimmune conditions. Hepatitis C  Blood testing is recommended for:  Everyone born from 1945 through 1965.  Anyone with known risk factors for hepatitis C. Sexually transmitted infections (STIs)  You should be screened for sexually transmitted infections (STIs) including gonorrhea and chlamydia if:  You are sexually active and are younger than 64 years of age.  You are older than 64 years of age and your health care provider tells you that you are at risk for this type of infection.  Your sexual activity has changed since you were last  screened and you are at an increased risk for chlamydia or gonorrhea. Ask your health care provider if you are at risk.  If you do not have HIV, but are at risk, it may be recommended that you take a prescription medicine daily to prevent HIV infection. This is called pre-exposure prophylaxis (PrEP). You are considered at risk if:  You are sexually active   and do not regularly use condoms or know the HIV status of your partner(s).  You take drugs by injection.  You are sexually active with a partner who has HIV. Talk with your health care provider about whether you are at high risk of being infected with HIV. If you choose to begin PrEP, you should first be tested for HIV. You should then be tested every 3 months for as long as you are taking PrEP.  PREGNANCY   If you are premenopausal and you may become pregnant, ask your health care provider about preconception counseling.  If you may become pregnant, take 400 to 800 micrograms (mcg) of folic acid every day.  If you want to prevent pregnancy, talk to your health care provider about birth control (contraception). OSTEOPOROSIS AND MENOPAUSE   Osteoporosis is a disease in which the bones lose minerals and strength with aging. This can result in serious bone fractures. Your risk for osteoporosis can be identified using a bone density scan.  If you are 65 years of age or older, or if you are at risk for osteoporosis and fractures, ask your health care provider if you should be screened.  Ask your health care provider whether you should take a calcium or vitamin D supplement to lower your risk for osteoporosis.  Menopause may have certain physical symptoms and risks.  Hormone replacement therapy may reduce some of these symptoms and risks. Talk to your health care provider about whether hormone replacement therapy is right for you.  HOME CARE INSTRUCTIONS   Schedule regular health, dental, and eye exams.  Stay current with your  immunizations.   Do not use any tobacco products including cigarettes, chewing tobacco, or electronic cigarettes.  If you are pregnant, do not drink alcohol.  If you are breastfeeding, limit how much and how often you drink alcohol.  Limit alcohol intake to no more than 1 drink per day for nonpregnant women. One drink equals 12 ounces of beer, 5 ounces of wine, or 1 ounces of hard liquor.  Do not use street drugs.  Do not share needles.  Ask your health care provider for help if you need support or information about quitting drugs.  Tell your health care provider if you often feel depressed.  Tell your health care provider if you have ever been abused or do not feel safe at home. Document Released: 10/05/2010 Document Revised: 08/06/2013 Document Reviewed: 02/21/2013 [employer] Patient Information 2015 [employer]. This information is not intended to replace advice given to you by your health care provider. Make sure you discuss any questions you have with your health care provider.   

## 2014-05-13 NOTE — Assessment & Plan Note (Signed)
Screening positive. Patient is currently being treated by psychiatry and psychology.

## 2014-05-13 NOTE — Telephone Encounter (Signed)
Please inform patient that her thyroid, kidney function, electrolytes, and cholesterol are all within normal limits. Her red blood cells indicate a slightly smaller size than normal. This could be related t iron deficiency.  I would recommend starting ferrous sulfate which is available over-the-counter by [facility] on a daily basis. Otherwise, her A1c was unchanged.

## 2014-05-13 NOTE — Assessment & Plan Note (Signed)
Obtain lipid profile. Stable with current dosage of atorvastatin.

## 2014-05-13 NOTE — Progress Notes (Signed)
Subjective:    Patient ID: Brittany Acosta, female    DOB: 1950/05/02, 64 y.o.   MRN: [id number]  Chief Complaint  Patient presents with  . CPE    Fasting    HPI:  Brittany Acosta is a 64 y.o. female who presents today for an annual wellness visit.   1) Health Maintenance - Overall believes her health to be poor. States she gained about 30 lbs in the last year, but not sure why  Diet - Indicates she is supposed to eat every 4 hours due to diabetes, but she eats about 2-3 meals per day; vegetables, protein, and some fruit; Trying to stay away from fried and processed foods  Exercise - no structured exercise; working on starting.   2) Preventative Exams / Immunizations:  Dental -- Up to date  Vision -- Scheduled for April   Health Maintenance  Topic Date Due  . PAP SMEAR  05/06/2018 (Originally 08/10/1968)  . OPHTHALMOLOGY EXAM  07/28/2014  . HEMOGLOBIN A1C  08/19/2014  . COLONOSCOPY  09/04/2014  . INFLUENZA VACCINE  11/04/2014  . FOOT EXAM  02/19/2015  . URINE MICROALBUMIN  02/19/2015  . MAMMOGRAM  08/16/2015  . PNEUMOCOCCAL POLYSACCHARIDE VACCINE (2) 04/22/2016  . TETANUS/TDAP  05/01/2023  . ZOSTAVAX  Completed    Immunization History  Administered Date(s) Administered  . Influenza Split 04/23/2011, 01/05/2012  . Influenza Whole 01/08/2008, 01/13/2009  . Influenza,inj,Quad PF,36+ Mos 12/06/2012  . Pneumococcal Polysaccharide-23 04/23/2011  . Td 04/05/2004, 04/30/2013  . Zoster 09/07/2013    No Known Allergies   Current Outpatient Prescriptions on File Prior to Visit  Medication Sig Dispense Refill  . Alpha-Lipoic Acid 300 MG TABS Take 300 mg by mouth daily.     . ARIPiprazole (ABILIFY) 20 MG tablet Take 10 mg by mouth every morning.     Marland Kitchen aspirin 81 MG tablet Take 81 mg by mouth daily.    Marland Kitchen atorvastatin (LIPITOR) 10 MG tablet Take 1 tablet (10 mg total) by mouth daily. 90 tablet 3  . B-D UF III MINI PEN NEEDLES 31G X 5 MM MISC USE AND DISCARD 1 PEN      NEEDLE  TWO TIMES A DAY 90 each 6  . Biotin 10 MG CAPS Take 1 capsule by mouth daily.    . Calcium Carbonate (CALCIUM 600 PO) Take 2 tablets by mouth daily.    . Cholecalciferol (VITAMIN D) 2000 UNITS CAPS Take 1 capsule by mouth daily.    . clonazePAM (KLONOPIN) 1 MG tablet Take 2 mg by mouth at bedtime.     . Coenzyme Q10 120 MG CAPS Take 120 mg by mouth daily.    Marland Kitchen doxepin (SINEQUAN) 10 MG capsule Take 10 mg by mouth at bedtime.     Marland Kitchen estradiol (ESTRACE) 0.5 MG tablet Take 1 tablet (0.5 mg total) by mouth daily. 90 tablet 1  . furosemide (LASIX) 40 MG tablet Take 1 tablet (40 mg total) by mouth daily. 90 tablet 3  . gabapentin (NEURONTIN) 300 MG capsule Take 2 capsules (600 mg total) by mouth 3 (three) times daily. 180 capsule 5  . hydrochlorothiazide 25 MG tablet Take 25 mg by mouth every morning.     . Insulin NPH, Human,, Isophane, (HUMULIN N KWIKPEN) 100 UNIT/ML Kiwkpen Inject 45 units in the morning and 35 units in the evening. (Patient taking differently: Inject 55 units in the morning and 35 units in the evening.) 30 mL 2  . lamoTRIgine (LAMICTAL) 200 MG  tablet Take 200 mg by mouth daily at 12 noon.     Marland Kitchen levothyroxine (SYNTHROID) 50 MCG tablet Take 1 tablet (50 mcg total) by mouth daily. 90 tablet 1  . Magnesium 200 MG TABS Take 1 tablet by mouth daily.    Marland Kitchen MANGANESE PO Take by mouth. 40mg  daily.    . Multiple Vitamin (MULTIVITAMIN WITH MINERALS) TABS Take 1 tablet by mouth every evening.    . NON FORMULARY Tumeric-720mg  2 times daily.    Marland Kitchen omeprazole (PRILOSEC) 20 MG capsule TAKE 1 CAPSULE TWICE DAILY 180 capsule 3  . oxybutynin (DITROPAN-XL) 10 MG 24 hr tablet Take 10 mg by mouth every evening.     . potassium citrate (UROCIT-K 10) 10 MEQ (1080 MG) SR tablet Take 20 mEq by mouth 2 (two) times daily.     . prazosin (MINIPRESS) 2 MG capsule Take 3 mg by mouth at bedtime.    . propranolol (INDERAL) 40 MG tablet Take 1 tablet (40 mg total) by mouth 2 (two) times daily. 60 tablet 11  .  Thiamine HCl (VITAMIN B-1) 100 MG tablet Take 100 mg by mouth every evening.     . Vortioxetine HBr (BRINTELLIX) 10 MG TABS Take 1 tablet by mouth every morning. 20 mg     No current facility-administered medications on file prior to visit.    Past Medical History  Diagnosis Date  . GOITER, MULTINODULAR   . HYPOTHYROIDISM   . HYPERCHOLESTEROLEMIA   . Morbid obesity   . BIPOLAR DISORDER UNSPECIFIED     depression  . Chronic diastolic heart failure   . ALLERGIC RHINITIS   . FIBROMYALGIA   . GERD (gastroesophageal reflux disease)   . History of wrist fracture     rt wrist  . Depression   . Type II diabetes mellitus   . Neuropathy due to secondary diabetes   . Iron deficiency anemia   . Kidney stones     sees urologist @ [place]  . History of gout     "haven't had it in several years" (02/08/2012)  . PTSD (post-traumatic stress disorder)   . Wears glasses   . Sleep apnea 7/14    mild osa-did not need cpap -dr Gwenette Greet  . PONV (postoperative nausea and vomiting)   . RBBB     Past Surgical History  Procedure Laterality Date  . Cholecystectomy  1985  . Abdominal hysterectomy  1976  . Tubal ligation  1972  . Right nasal surgery  08/1988  . Right sinus removed  08/1989    tooth partial  . Percitania stone removed (l) kidney  1992  . Lithotripsy (l) kidney  1997  . Left cystoscopy   1990  . Thyroidectomy  04/22/2011    Procedure: THYROIDECTOMY;  Surgeon: Onnie Graham, MD;  Location: [place];  Service: ENT;  Laterality: N/A;  TOTAL THYROIDECOTMY  . Cataract extraction, bilateral  02/2011    epps  . Total knee arthroplasty  06/18/2011    Procedure: TOTAL KNEE ARTHROPLASTY;  Surgeon: Kerin Salen, MD;  Location: [place];  Service: Orthopedics;  Laterality: Left;  DEPUY  . Total knee arthroplasty  02/07/2012    Procedure: TOTAL KNEE ARTHROPLASTY;  Surgeon: Kerin Salen, MD;  Location: [place];  Service: Orthopedics;  Laterality: Right;  . Incisional breast biopsy  2000    right  .  Cardiac catheterization  2001    sees Dr Peter Martinique  . Knee arthroscopy  08/2009    right  .  Knee arthroscopy with medial menisectomy      left  . Excisional hemorrhoidectomy      "dr cut out in his office" (02/08/2012)  . Left oophorectomy  1980  . Rotator cuff repair  2013    right shoulder x 2  . Joint replacement      rt knee  . Partial mastectomy with needle localization Left 09/01/2012    Procedure: PARTIAL MASTECTOMY WITH NEEDLE LOCALIZATION;  Surgeon: Adin Hector, MD;  Location: [place];  Service: General;  Laterality: Left;  . Cataract extraction  2014  . Shoulder arthroscopy with rotator cuff repair and subacromial decompression Left 08/20/2013    Procedure: SHOULDER ARTHROSCOPY  AND SUBACROMIAL DECOMPRESSION;  Surgeon: Nita Sells, MD;  Location: [place];  Service: Orthopedics;  Laterality: Left;  Left shoulder arthroscopy, debridement, subacromial decompression, distal clavical resection    Family History  Problem Relation Age of Onset  . Hypertension Mother   . Diabetes Father   . Hypertension Father   . Hyperlipidemia Father   . Heart attack Other   . Coronary artery disease Other     History   Social History  . Marital Status: Married    Spouse Name: N/A    Number of Children: 2  . Years of Education: 14   Occupational History  . disability    Social History Main Topics  . Smoking status: Former Smoker -- 1.00 packs/day for 6 years    Types: Cigarettes    Quit date: 04/06/1983  . Smokeless tobacco: Never Used     Comment: Married, lives with spouse. Disable- 2 grown kids-6 g-kids  . Alcohol Use: No     Comment: none  . Drug Use: No  . Sexual Activity: Not Currently   Other Topics Concern  . Not on file   Social History Narrative   Married, lives with spouse - 2 adult children   diabled    RISK FACTORS  Tobacco History  Smoking status  . Former Smoker -- 1.00 packs/day for 6 years  . Types: Cigarettes  .  Quit date: 04/06/1983  Smokeless tobacco  . Never Used    Comment: Married, lives with spouse. Disable- 2 grown kids-6 g-kids     Cardiac risk factors: diabetes mellitus, dyslipidemia, hypertension, obesity (BMI >= 30 kg/m2) and sedentary lifestyle.  Depression Screen  Q1: Over the past two weeks, have you felt down, depressed or hopeless? Yes  Q2: Over the past two weeks, have you felt little interest or pleasure in doing things? Yes  Have you lost interest or pleasure in daily life? Yes  Do you often feel hopeless? Yes  Do you cry easily over simple problems? Yes   Currently taking medication, but not helping a lot - Dr. Wadie Lessen (psychiatrist)  Activities of Daily Living In your present state of health, do you have any difficulty performing the following activities?:  Driving? No Managing money?  No Feeding yourself? No Getting from bed to chair? No Climbing a flight of stairs? No Preparing food and eating?: No Bathing or showering? No Getting dressed: No Getting to the toilet? No Using the toilet: No Moving around from place to place: No In the past year have you fallen or had a near fall?:No   Home Safety Has smoke detector and wears seat belts. No firearms. No excess sun exposure. Are there smokers in your home (other than you)?  No Do you feel safe at home?  Yes  Hearing Difficulties:  No Do you often ask people to speak up or repeat themselves? No Do you experience ringing or noises in your ears? No  Do you have difficulty understanding soft or whispered voices? No    Cognitive Testing  Alert? Yes   Normal Appearance? Yes  Oriented to person? Yes  Place? Yes   Time? Yes  Recall of three objects?  Yes  Can perform simple calculations? Yes  Displays appropriate judgment? Yes  Can read the correct time from a watch face? Yes  Do you feel that you have a problem with memory? No  Do you often misplace items? No   Advanced Directives have been discussed with the  patient? Yes  Current Physicians/Providers and Suppliers  1. Dr. Gwendolyn Grant - PCP 2. Dr. Emelda Brothers - Psychiatrist 3. Dr. Clance Boll Ragen - Psychologist 4. Dr. Renato Shin - Endocrinology 5. Dr. Tamera Punt - Othropedics 6. Dr. Charlann Boxer - Sports Medicine 7. Dr. Amalia Hailey - Podiatry 8. Dr. Monica Martinez Premminger - Urology  Indicate any recent Medical Services you may have received from other than [facility] providers in the past year (date may be approximate).  All answers were reviewed with the patient and necessary referrals were made:  Mauricio Po, [place]   05/13/2014    Review of Systems  Constitutional: Denies fever, chills, fatigue, or significant weight gain/loss. HENT: Head: Denies headache or neck pain Ears: Denies changes in hearing, ringing in ears, earache, drainage Nose: Denies discharge, stuffiness, itching, nosebleed, sinus pain Throat: Denies sore throat, hoarseness, dry mouth, sores, thrush Eyes: Denies loss/changes in vision, pain, redness, blurry/double vision, flashing lights Cardiovascular: Denies chest pain/discomfort, tightness, palpitations, shortness of breath with activity, difficulty lying down, swelling, sudden awakening with shortness of breath Respiratory: Denies shortness of breath, cough, sputum production, wheezing Gastrointestinal: Denies dysphasia, heartburn, change in appetite, nausea, change in bowel habits, rectal bleeding, constipation, diarrhea, yellow skin or eyes Genitourinary: Denies frequency, urgency, burning/pain, blood in urine, incontinence, change in urinary strength. Musculoskeletal: Denies muscle/joint pain, stiffness, back pain, redness or swelling of joints, trauma Skin: Denies rashes, lumps, itching, dryness, color changes, or hair/nail changes Neurological: Denies dizziness, fainting, seizures, weakness, numbness, tingling, tremor Psychiatric - Denies nervousness, stress, depression or memory loss Endocrine: Denies heat or cold intolerance,  sweating, frequent urination, excessive thirst, changes in appetite Hematologic: Denies ease of bruising or bleeding    Objective:     BP 152/78 mmHg  Pulse 71  Temp(Src) 97.6 F (36.4 C) (Oral)  Resp 18  Ht 5\' 3"  (1.6 m)  Wt 270 lb 1.9 oz (122.526 kg)  BMI 47.86 kg/m2  SpO2 95% Nursing note and vital signs reviewed.  Physical Exam  Constitutional: She is oriented to person, place, and time. She appears well-developed and well-nourished. No distress.  Cardiovascular: Normal rate, regular rhythm, normal heart sounds and intact distal pulses.   Pulmonary/Chest: Effort normal and breath sounds normal.  Neurological: She is alert and oriented to person, place, and time.  Skin: Skin is warm and dry.  Psychiatric: She has a normal mood and affect. Her behavior is normal. Judgment and thought content normal.       Assessment & Plan:   During the course of the visit the patient was educated and counseled about appropriate screening and preventive services including:    Pneumococcal vaccine   Td vaccine  Colorectal cancer screening  Diabetes screening  Nutrition counseling   Diet review for nutrition referral? Yes ____  Not Indicated _X___   Patient Instructions (the written  plan) was given to the patient.  Medicare Attestation I have personally reviewed: The patient's medical and social history Their use of alcohol, tobacco or illicit drugs Their current medications and supplements The patient's functional ability including ADLs,fall risks, home safety risks, cognitive, and hearing and visual impairment Diet and physical activities Evidence for depression or mood disorders  The patient's weight, height, BMI,  have been recorded in the chart.  I have made referrals, counseling, and provided education to the patient based on review of the above and I have provided the patient with a written personalized care plan for preventive services.     Mauricio Po,  [place]   05/13/2014

## 2014-05-13 NOTE — Progress Notes (Signed)
Subjective:    Patient ID: Brittany Acosta, female    DOB: 01-07-1951, 64 y.o.   MRN: [id number]  HPI Pt returns for f/u of diabetes mellitus: DM type: Insulin-requiring type 2 Dx'ed: 7425 Complications: neuropathy Therapy: insulin since 2004 GDM: never DKA: never Severe hypoglycemia: never Pancreatitis: never.  Other: in 2012, she was changed to a simple BID insulin regimen, after she did not get good control on multiple daily injections; she says she can only afford inexpensive human insulin; she declines weight-loss surgery.   Interval history: no cbg record, but states cbg's mildly low before breakfast (most often time), and before lunch (if lunch is delayed).  Pt says she never misses the insulin injections.   Past Medical History  Diagnosis Date  . GOITER, MULTINODULAR   . HYPOTHYROIDISM   . HYPERCHOLESTEROLEMIA   . Morbid obesity   . BIPOLAR DISORDER UNSPECIFIED     depression  . Chronic diastolic heart failure   . ALLERGIC RHINITIS   . FIBROMYALGIA   . GERD (gastroesophageal reflux disease)   . History of wrist fracture     rt wrist  . Depression   . Type II diabetes mellitus   . Neuropathy due to secondary diabetes   . Iron deficiency anemia   . Kidney stones     sees urologist @ [place]  . History of gout     "haven't had it in several years" (02/08/2012)  . PTSD (post-traumatic stress disorder)   . Wears glasses   . Sleep apnea 7/14    mild osa-did not need cpap -dr Gwenette Greet  . PONV (postoperative nausea and vomiting)   . RBBB     Past Surgical History  Procedure Laterality Date  . Cholecystectomy  1985  . Abdominal hysterectomy  1976  . Tubal ligation  1972  . Right nasal surgery  08/1988  . Right sinus removed  08/1989    tooth partial  . Percitania stone removed (l) kidney  1992  . Lithotripsy (l) kidney  1997  . Left cystoscopy   1990  . Thyroidectomy  04/22/2011    Procedure: THYROIDECTOMY;  Surgeon: Onnie Graham, MD;  Location: [place];  Service: ENT;   Laterality: N/A;  TOTAL THYROIDECOTMY  . Cataract extraction, bilateral  02/2011    epps  . Total knee arthroplasty  06/18/2011    Procedure: TOTAL KNEE ARTHROPLASTY;  Surgeon: Kerin Salen, MD;  Location: [place];  Service: Orthopedics;  Laterality: Left;  DEPUY  . Total knee arthroplasty  02/07/2012    Procedure: TOTAL KNEE ARTHROPLASTY;  Surgeon: Kerin Salen, MD;  Location: [place];  Service: Orthopedics;  Laterality: Right;  . Incisional breast biopsy  2000    right  . Cardiac catheterization  2001    sees Dr Peter Martinique  . Knee arthroscopy  08/2009    right  . Knee arthroscopy with medial menisectomy      left  . Excisional hemorrhoidectomy      "dr cut out in his office" (02/08/2012)  . Left oophorectomy  1980  . Rotator cuff repair  2013    right shoulder x 2  . Joint replacement      rt knee  . Partial mastectomy with needle localization Left 09/01/2012    Procedure: PARTIAL MASTECTOMY WITH NEEDLE LOCALIZATION;  Surgeon: Adin Hector, MD;  Location: [employer] [place] [facility];  Service: General;  Laterality: Left;  . Cataract extraction  2014  . Shoulder arthroscopy with rotator cuff repair  and subacromial decompression Left 08/20/2013    Procedure: SHOULDER ARTHROSCOPY  AND SUBACROMIAL DECOMPRESSION;  Surgeon: Nita Sells, MD;  Location: [place];  Service: Orthopedics;  Laterality: Left;  Left shoulder arthroscopy, debridement, subacromial decompression, distal clavical resection    History   Social History  . Marital Status: Married    Spouse Name: N/A    Number of Children: 2  . Years of Education: 14   Occupational History  . disability    Social History Main Topics  . Smoking status: Former Smoker -- 1.00 packs/day for 6 years    Types: Cigarettes    Quit date: 04/06/1983  . Smokeless tobacco: Never Used     Comment: Married, lives with spouse. Disable- 2 grown kids-6 g-kids  . Alcohol Use: No     Comment: none  . Drug Use: No  . Sexual Activity:  Not Currently   Other Topics Concern  . Not on file   Social History Narrative   Married, lives with spouse - 2 adult children   diabled    Current Outpatient Prescriptions on File Prior to Visit  Medication Sig Dispense Refill  . Alpha-Lipoic Acid 300 MG TABS Take 300 mg by mouth daily.     . ARIPiprazole (ABILIFY) 20 MG tablet Take 10 mg by mouth every morning.     Marland Kitchen aspirin 81 MG tablet Take 81 mg by mouth daily.    Marland Kitchen atorvastatin (LIPITOR) 10 MG tablet Take 1 tablet (10 mg total) by mouth daily. 90 tablet 3  . B-D UF III MINI PEN NEEDLES 31G X 5 MM MISC USE AND DISCARD 1 PEN      NEEDLE TWO TIMES A DAY 90 each 6  . Biotin 10 MG CAPS Take 1 capsule by mouth daily.    . Calcium Carbonate (CALCIUM 600 PO) Take 2 tablets by mouth daily.    . Cholecalciferol (VITAMIN D) 2000 UNITS CAPS Take 1 capsule by mouth daily.    . clonazePAM (KLONOPIN) 1 MG tablet Take 2 mg by mouth at bedtime.     . Coenzyme Q10 120 MG CAPS Take 120 mg by mouth daily.    Marland Kitchen doxepin (SINEQUAN) 10 MG capsule Take 10 mg by mouth at bedtime.     Marland Kitchen estradiol (ESTRACE) 0.5 MG tablet Take 1 tablet (0.5 mg total) by mouth daily. 90 tablet 1  . furosemide (LASIX) 40 MG tablet Take 1 tablet (40 mg total) by mouth daily. 90 tablet 3  . gabapentin (NEURONTIN) 300 MG capsule Take 2 capsules (600 mg total) by mouth 3 (three) times daily. 180 capsule 5  . hydrochlorothiazide 25 MG tablet Take 25 mg by mouth every morning.     . lamoTRIgine (LAMICTAL) 200 MG tablet Take 200 mg by mouth daily at 12 noon.     Marland Kitchen levothyroxine (SYNTHROID) 50 MCG tablet Take 1 tablet (50 mcg total) by mouth daily. 90 tablet 1  . Magnesium 200 MG TABS Take 1 tablet by mouth daily.    Marland Kitchen MANGANESE PO Take by mouth. 40mg  daily.    . Multiple Vitamin (MULTIVITAMIN WITH MINERALS) TABS Take 1 tablet by mouth every evening.    . NON FORMULARY Tumeric-720mg  2 times daily.    Marland Kitchen omeprazole (PRILOSEC) 20 MG capsule TAKE 1 CAPSULE TWICE DAILY 180 capsule 3  .  oxybutynin (DITROPAN-XL) 10 MG 24 hr tablet Take 10 mg by mouth every evening.     . potassium citrate (UROCIT-K 10) 10 MEQ (1080  MG) SR tablet Take 20 mEq by mouth 2 (two) times daily.     . prazosin (MINIPRESS) 2 MG capsule Take 3 mg by mouth at bedtime.    . propranolol (INDERAL) 40 MG tablet Take 1 tablet (40 mg total) by mouth 2 (two) times daily. 60 tablet 11  . Thiamine HCl (VITAMIN B-1) 100 MG tablet Take 100 mg by mouth every evening.     . Vortioxetine HBr (BRINTELLIX) 10 MG TABS Take 1 tablet by mouth every morning. 20 mg     No current facility-administered medications on file prior to visit.    No Known Allergies  Family History  Problem Relation Age of Onset  . Hypertension Mother   . Diabetes Father   . Hypertension Father   . Hyperlipidemia Father   . Heart attack Other   . Coronary artery disease Other     BP 138/92 mmHg  Pulse 81  Temp(Src) 98.1 F (36.7 C) (Oral)  Ht 5\' 3"  (1.6 m)  Wt 270 lb (122.471 kg)  BMI 47.84 kg/m2  SpO2 91%    Review of Systems Denies LOC and weight change    Objective:   Physical Exam VITAL SIGNS:  See vs page GENERAL: no distress Pulses: dorsalis pedis intact bilat.   MSK: no deformity of the feet.  CV: no leg edema.  Skin:  no ulcer on the feet.  normal color and temp on the feet. Neuro: sensation is intact to touch on the feet, but decreased from normal.    Lab Results  Component Value Date   HGBA1C 8.1* 05/13/2014       Assessment & Plan:  DM: moderate exacerbation. Side-effect of rx: mild hypoglycemia.  Noncompliance with cbg recording: I'll work around this as best I can.     Patient is advised the following: Patient Instructions  Please come back for a follow-up appointment in 3 months.  on this type of insulin, it is really important to eat lunch no later than 4 hrs after breakfast.  Otherwise, your blood sugar could go low.   check your blood sugar twice a day.  vary the time of day when you check,  between before the 3 meals, and at bedtime.  also check if you have symptoms of your blood sugar being too high or too low.  please keep a record of the readings and bring it to your next appointment here.  please call us sooner if your blood sugar goes below 70, or if it stays over 200.     please increase the insulin to 70 units each morning, and 30 units each evening.

## 2014-05-13 NOTE — Assessment & Plan Note (Signed)
Obtain TSH. Stable with current regimen. Continue levothyroxine at 50 g.

## 2014-05-13 NOTE — Assessment & Plan Note (Signed)
Reviewed and updated patient's medical, surgical, family and social history. Medications and allergies were also reviewed. Basic screenings for depression, activities of daily living, hearing, cognition and safety were performed. Provider list was updated and health plan was provided to the patient.   1) Anticipatory Guidance: Discussed importance of wearing a seatbelt while driving and not texting while driving; changing batteries in smoke detector at least once annually; wearing suntan lotion when outside; eating a balanced and moderate diet; getting physical activity at least 30 minutes per day.  2) Immunizations / Screenings / Labs:  All immunizations are up-to-date per recommendations. Patient will be due for colonoscopy in June. Refer to gastroenterology for colonoscopy. All other recommended screenings have been completed per recommendations.Obtain CBC, BMET, A1c, Lipid profile and TSH.   Adequate health exam. Patient has multiple comorbidities which challenged her overall healthful living. Discussed risk factors of hypertension, hyperlipidemia, obesity, diabetes, and sedentary lifestyle. Discussed importance of eating a nutrient dense diet including fruits and vegetables with minimal saturated fats, processed foods and fried foods. Encouraged increasing physical activity to most days of the week for at least 30 minutes. Patient indicates she is going to start an exercise program in the pool. Patient depression screen is positive. She is being treated by psychology and psychiatry. Continue medications as prescribed. Follow-up annual wellness visit in one year.

## 2014-05-20 ENCOUNTER — Ambulatory Visit: Payer: Medicare Other | Admitting: Family Medicine

## 2014-05-27 ENCOUNTER — Ambulatory Visit: Payer: Medicare Other | Admitting: Family Medicine

## 2014-05-31 ENCOUNTER — Ambulatory Visit (INDEPENDENT_AMBULATORY_CARE_PROVIDER_SITE_OTHER): Payer: Medicare Other | Admitting: Family Medicine

## 2014-05-31 ENCOUNTER — Encounter: Payer: Self-pay | Admitting: Family Medicine

## 2014-05-31 ENCOUNTER — Ambulatory Visit (INDEPENDENT_AMBULATORY_CARE_PROVIDER_SITE_OTHER)
Admission: RE | Admit: 2014-05-31 | Discharge: 2014-05-31 | Disposition: A | Discharge status: Home / Self Care | Payer: Medicare Other | Source: Ambulatory Visit | Attending: Family Medicine | Admitting: Family Medicine

## 2014-05-31 VITALS — BP 144/86 | HR 74 | Ht 63.0 in | Wt 279.0 lb

## 2014-05-31 DIAGNOSIS — M25552 Pain in left hip: Secondary | ICD-10-CM

## 2014-05-31 DIAGNOSIS — M5416 Radiculopathy, lumbar region: Secondary | ICD-10-CM

## 2014-05-31 MED ORDER — TRAMADOL HCL 50 MG PO TABS: 50.0000 mg | ORAL_TABLET | Freq: Three times a day (TID) | ORAL | Status: DC

## 2014-05-31 NOTE — Patient Instructions (Signed)
Good to see you Exercises 3 times a week.  Ice 20 minutes 2 times daily. Usually after activity and before bed. Try tramadol up to 3 times a day as needed Take the meloxicam you were given Get xrays downstairs See me again in 2 weeks.

## 2014-05-31 NOTE — Progress Notes (Signed)
[name] [address] [place], [place] [address] Phone: [phone] Subjective:     CC: Continued lateral hip pain XBD:[id number] Brittany Acosta is a 64 y.o. female coming in with complaint of continued lateral hip pain. Patient was seen one month ago and was given an injection on the lateral aspect of her hip. Patient states it only worked for approximately 1 week and then the pain came back. Patient did see a orthopedic physician this morning and was given another injection in the hip but continues to have the discomfort. When asked patient does state that she does have back pain that seems to be associated with it. Patient denies any significant radiation down the leg but states this seems to be localized. Patient states that she moves certain whether she can have an electrical sensation down her leg. Patient denies any changes in medications and continues with the gabapentin 300 mg 2 times a day and 600 mg at night. Patient states that it is very uncomfortable and night and has difficulty falling asleep as well as staying asleep secondary to pain and rates the severity of pain a 7 out of 10.  Marland Kitchen    Past medical history, social, surgical and family history all reviewed in electronic medical record.   Review of Systems: No headache, visual changes, nausea, vomiting, diarrhea, constipation, dizziness, abdominal pain, skin rash, fevers, chills, night sweats, weight loss, swollen lymph nodes, body aches, joint swelling, muscle aches, chest pain, shortness of breath, mood changes.   Objective Blood pressure 144/86, pulse 74, height 5\' 3"  (1.6 m), weight 279 lb (126.554 kg), SpO2 97 %.  General: No apparent distress alert and oriented x3 mood and affect normal, dressed appropriately.  HEENT: Pupils equal, extraocular movements intact  Respiratory: Patient's speak in full sentences and does not appear short of breath  Cardiovascular: 2+ lower extremity edema, non  tender, no erythema  Skin: Warm dry intact with no signs of infection or rash on extremities or on axial skeleton.  Abdomen: Soft nontender  Neuro: Cranial nerves II through XII are intact, neurovascularly intact in all extremities with 2+ DTRs and 2+ pulses.  Lymph: No lymphadenopathy of posterior or anterior cervical chain or axillae bilaterally.  Gait normal with good balance and coordination.  MSK:  Non tender with full range of motion and good stability and symmetric strength and tone of shoulders, elbows, wrist, , knee and ankles bilaterally.  Foot Exam shows  Patient does have overpronation hindfoot bilaterally significantly. Patient does have some breakdown of the longitudinal as well as the transverse arch causing splaying between the first and second toes bilaterally right greater than left. Patient does have hammering of the third fourth and fifth toes bilaterally  No Haglund's nodule noted. Nontender on exam today significant improvement from previous exam.  Back Exam:  Inspection: Mild increase in lordosis secondary to patient's body habitus Motion: Flexion 45 deg, Extension 25 deg, Side Bending to 35 deg bilaterally,  Rotation to 35 deg bilaterally  SLR laying: Positive XSLR laying: Negative  Palpable tenderness: moderate pain, severe pain over the L5-S1 area on left. FABER: negative. Sensory change: Gross sensation intact to all lumbar and sacral dermatomes.  Reflexes: 2+ at both patellar tendons, 2+ at achilles tendons, Babinski's downgoing.  Strength at foot  Plantar-flexion: 5/5 Dorsi-flexion: 5/5 Eversion: 5/5 Inversion: 5/5  Leg strength  Quad: 3/5 Hamstring: 5/5 Hip flexor: 3/5 Hip abductors: 3/5 but symmetric compared to contralateral side Gait unremarkable.  Impression and Recommendations:

## 2014-05-31 NOTE — Assessment & Plan Note (Signed)
With patient not responding well to localize injections of the greater trochanteric area and do think that there is a chance that this is secondary to more of a lumbar radiculopathy. X-rays were ordered today and we will see if there is any narrowing at the L4-L5 or S1 levels. We discussed icing regimen as well as topical anti-inflammatories. We discussed continuing the gabapentin for now. Patient would not be a candidate for oral prednisone secondary to patient's poor glucose control at this time. Depending on findings and patient's response to conservative therapy further imaging may be necessary. Patient knows that if she gets any weakness or any numbness she will go to the emergency department immediately.

## 2014-05-31 NOTE — Progress Notes (Signed)
Pre visit review using our clinic review tool, if applicable. No additional management support is needed unless otherwise documented below in the visit note. 

## 2014-06-04 ENCOUNTER — Telehealth: Payer: Self-pay

## 2014-06-04 DIAGNOSIS — Z808 Family history of malignant neoplasm of other organs or systems: Secondary | ICD-10-CM

## 2014-06-04 NOTE — Telephone Encounter (Signed)
Referral made 

## 2014-06-04 NOTE — Telephone Encounter (Signed)
Pt called wanting to know if there could be a referral for dermatology put in due to the spots she has on her. She also has a hx of melanoma in her family. Can you put a referral in? Please advise.

## 2014-06-12 ENCOUNTER — Telehealth: Payer: Self-pay | Admitting: Endocrinology

## 2014-06-12 ENCOUNTER — Other Ambulatory Visit: Payer: Self-pay

## 2014-06-12 MED ORDER — INSULIN ISOPHANE HUMAN 100 UNIT/ML KWIKPEN: PEN_INJECTOR | SUBCUTANEOUS | Status: DC

## 2014-06-12 MED ORDER — INSULIN PEN NEEDLE 31G X 5 MM MISC: Status: DC

## 2014-06-12 NOTE — Telephone Encounter (Signed)
Spoke with MD. Hulen Skains in (and documented) rx refill. Pt notified.

## 2014-06-12 NOTE — Telephone Encounter (Signed)
Pt needs insulin  and needles called in for refill she is out of town due to family emergency  [facility] [place] number [phone] call pt please when this has been done

## 2014-06-14 ENCOUNTER — Ambulatory Visit: Payer: Medicare Other | Admitting: Family Medicine

## 2014-06-25 ENCOUNTER — Ambulatory Visit: Payer: Medicare Other | Admitting: Family Medicine

## 2014-06-27 ENCOUNTER — Other Ambulatory Visit: Payer: Self-pay | Admitting: *Deleted

## 2014-06-27 MED ORDER — GABAPENTIN 300 MG PO CAPS: 600.0000 mg | ORAL_CAPSULE | Freq: Three times a day (TID) | ORAL | Status: DC

## 2014-06-27 NOTE — Telephone Encounter (Signed)
Refill done.  

## 2014-07-31 ENCOUNTER — Other Ambulatory Visit: Payer: Self-pay

## 2014-07-31 MED ORDER — INSULIN PEN NEEDLE 31G X 5 MM MISC: Status: DC

## 2014-08-02 ENCOUNTER — Ambulatory Visit: Payer: Medicare Other | Admitting: Family Medicine

## 2014-08-12 ENCOUNTER — Ambulatory Visit: Payer: Medicare Other | Admitting: Endocrinology

## 2014-08-19 ENCOUNTER — Encounter: Payer: Medicare Other | Admitting: Internal Medicine

## 2014-08-19 ENCOUNTER — Encounter: Payer: Self-pay | Admitting: Endocrinology

## 2014-08-19 ENCOUNTER — Ambulatory Visit (INDEPENDENT_AMBULATORY_CARE_PROVIDER_SITE_OTHER): Payer: Medicare Other | Admitting: Endocrinology

## 2014-08-19 VITALS — BP 134/88 | HR 83 | Wt 273.0 lb

## 2014-08-19 DIAGNOSIS — E1041 Type 1 diabetes mellitus with diabetic mononeuropathy: Secondary | ICD-10-CM | POA: Diagnosis not present

## 2014-08-19 DIAGNOSIS — IMO0002 Reserved for concepts with insufficient information to code with codable children: Secondary | ICD-10-CM

## 2014-08-19 DIAGNOSIS — E1065 Type 1 diabetes mellitus with hyperglycemia: Secondary | ICD-10-CM

## 2014-08-19 DIAGNOSIS — E1049 Type 1 diabetes mellitus with other diabetic neurological complication: Secondary | ICD-10-CM

## 2014-08-19 LAB — HEMOGLOBIN A1C: Hgb A1c MFr Bld: 6.8 % — ABNORMAL HIGH (ref 4.6–6.5)

## 2014-08-19 MED ORDER — INSULIN ISOPHANE HUMAN 100 UNIT/ML KWIKPEN: PEN_INJECTOR | SUBCUTANEOUS | Status: DC

## 2014-08-19 MED ORDER — INSULIN PEN NEEDLE 31G X 5 MM MISC: Status: DC

## 2014-08-19 NOTE — Progress Notes (Signed)
Subjective:    Patient ID: Brittany Acosta, female    DOB: March 02, 1951, 64 y.o.   MRN: [id number]  HPI Pt returns for f/u of diabetes mellitus: DM type: Insulin-requiring type 2 Dx'ed: 3295 Complications: neuropathy Therapy: insulin since 2004 GDM: never DKA: never Severe hypoglycemia: never Pancreatitis: never.  Other: in 2012, she was changed to a simple BID insulin regimen, after she did not get good control on multiple daily injections; she says she can only afford inexpensive human insulin; she declines weight-loss surgery.   Interval history: Pt says she never misses the insulin injections.  no cbg record, but states cbg's are mildly low approx twice a week.  This usually happens before lunch, or in the afternoon.   Past Medical History  Diagnosis Date  . GOITER, MULTINODULAR   . HYPOTHYROIDISM   . HYPERCHOLESTEROLEMIA   . Morbid obesity   . BIPOLAR DISORDER UNSPECIFIED     depression  . Chronic diastolic heart failure   . ALLERGIC RHINITIS   . FIBROMYALGIA   . GERD (gastroesophageal reflux disease)   . History of wrist fracture     rt wrist  . Depression   . Type II diabetes mellitus   . Neuropathy due to secondary diabetes   . Iron deficiency anemia   . Kidney stones     sees urologist @ [facility]  . History of gout     "haven't had it in several years" (02/08/2012)  . PTSD (post-traumatic stress disorder)   . Wears glasses   . Sleep apnea 7/14    mild osa-did not need cpap -dr Gwenette Greet  . PONV (postoperative nausea and vomiting)   . RBBB     Past Surgical History  Procedure Laterality Date  . Cholecystectomy  1985  . Abdominal hysterectomy  1976  . Tubal ligation  1972  . Right nasal surgery  08/1988  . Right sinus removed  08/1989    tooth partial  . Percitania stone removed (l) kidney  1992  . Lithotripsy (l) kidney  1997  . Left cystoscopy   1990  . Thyroidectomy  04/22/2011    Procedure: THYROIDECTOMY;  Surgeon: Onnie Graham, MD;  Location: [facility];  Service:  ENT;  Laterality: N/A;  TOTAL THYROIDECOTMY  . Cataract extraction, bilateral  02/2011    epps  . Total knee arthroplasty  06/18/2011    Procedure: TOTAL KNEE ARTHROPLASTY;  Surgeon: Kerin Salen, MD;  Location: [facility];  Service: Orthopedics;  Laterality: Left;  DEPUY  . Total knee arthroplasty  02/07/2012    Procedure: TOTAL KNEE ARTHROPLASTY;  Surgeon: Kerin Salen, MD;  Location: [facility];  Service: Orthopedics;  Laterality: Right;  . Incisional breast biopsy  2000    right  . Cardiac catheterization  2001    sees Dr Peter Martinique  . Knee arthroscopy  08/2009    right  . Knee arthroscopy with medial menisectomy      left  . Excisional hemorrhoidectomy      "dr cut out in his office" (02/08/2012)  . Left oophorectomy  1980  . Rotator cuff repair  2013    right shoulder x 2  . Joint replacement      rt knee  . Partial mastectomy with needle localization Left 09/01/2012    Procedure: PARTIAL MASTECTOMY WITH NEEDLE LOCALIZATION;  Surgeon: Adin Hector, MD;  Location: [facility];  Service: General;  Laterality: Left;  . Cataract extraction  2014  . Shoulder arthroscopy with  rotator cuff repair and subacromial decompression Left 08/20/2013    Procedure: SHOULDER ARTHROSCOPY  AND SUBACROMIAL DECOMPRESSION;  Surgeon: Nita Sells, MD;  Location: [facility];  Service: Orthopedics;  Laterality: Left;  Left shoulder arthroscopy, debridement, subacromial decompression, distal clavical resection    History   Social History  . Marital Status: Married    Spouse Name: N/A  . Number of Children: 2  . Years of Education: 14   Occupational History  . disability    Social History Main Topics  . Smoking status: Former Smoker -- 1.00 packs/day for 6 years    Types: Cigarettes    Quit date: 04/06/1983  . Smokeless tobacco: Never Used     Comment: Married, lives with spouse. Disable- 2 grown kids-6 g-kids  . Alcohol Use: No     Comment: none  . Drug Use: No  . Sexual  Activity: Not Currently   Other Topics Concern  . Not on file   Social History Narrative   Married, lives with spouse - 2 adult children   diabled    Current Outpatient Prescriptions on File Prior to Visit  Medication Sig Dispense Refill  . Alpha-Lipoic Acid 300 MG TABS Take 300 mg by mouth daily.     . ARIPiprazole (ABILIFY) 20 MG tablet Take 10 mg by mouth every morning.     Marland Kitchen aspirin 81 MG tablet Take 81 mg by mouth daily.    Marland Kitchen atorvastatin (LIPITOR) 10 MG tablet Take 1 tablet (10 mg total) by mouth daily. 90 tablet 3  . Biotin 10 MG CAPS Take 1 capsule by mouth daily.    . Calcium Carbonate (CALCIUM 600 PO) Take 2 tablets by mouth daily.    . Cholecalciferol (VITAMIN D) 2000 UNITS CAPS Take 1 capsule by mouth daily.    . clonazePAM (KLONOPIN) 1 MG tablet Take 2 mg by mouth at bedtime.     . Coenzyme Q10 120 MG CAPS Take 120 mg by mouth daily.    Marland Kitchen doxepin (SINEQUAN) 10 MG capsule Take 10 mg by mouth at bedtime.     Marland Kitchen estradiol (ESTRACE) 0.5 MG tablet Take 1 tablet (0.5 mg total) by mouth daily. 90 tablet 1  . furosemide (LASIX) 40 MG tablet Take 1 tablet (40 mg total) by mouth daily. 90 tablet 3  . gabapentin (NEURONTIN) 300 MG capsule Take 2 capsules (600 mg total) by mouth 3 (three) times daily. 180 capsule 5  . hydrochlorothiazide 25 MG tablet Take 25 mg by mouth every morning.     . lamoTRIgine (LAMICTAL) 200 MG tablet Take 200 mg by mouth daily at 12 noon.     Marland Kitchen levothyroxine (SYNTHROID) 50 MCG tablet Take 1 tablet (50 mcg total) by mouth daily. 90 tablet 1  . Magnesium 200 MG TABS Take 1 tablet by mouth daily.    Marland Kitchen MANGANESE PO Take by mouth. 40mg  daily.    . Melatonin 3 MG CAPS Take 1 capsule by mouth at bedtime.    . Multiple Vitamin (MULTIVITAMIN WITH MINERALS) TABS Take 1 tablet by mouth every evening.    . NON FORMULARY Tumeric-720mg  2 times daily.    Marland Kitchen oxybutynin (DITROPAN-XL) 10 MG 24 hr tablet Take 10 mg by mouth every evening.     . potassium citrate (UROCIT-K 10)  10 MEQ (1080 MG) SR tablet Take 20 mEq by mouth 2 (two) times daily.     . prazosin (MINIPRESS) 2 MG capsule Take 3 mg by mouth at bedtime.    Marland Kitchen  Thiamine HCl (VITAMIN B-1) 100 MG tablet Take 100 mg by mouth every evening.     . traMADol (ULTRAM) 50 MG tablet Take 1 tablet (50 mg total) by mouth 3 (three) times daily. 30 tablet 0  . Vortioxetine HBr (BRINTELLIX) 10 MG TABS Take 1 tablet by mouth every morning. 20 mg     No current facility-administered medications on file prior to visit.    No Known Allergies  Family History  Problem Relation Age of Onset  . Hypertension Mother   . Diabetes Father   . Hypertension Father   . Hyperlipidemia Father   . Heart attack Other   . Coronary artery disease Other     BP 134/88 mmHg  Pulse 83  Wt 273 lb (123.832 kg)  SpO2 98%    Review of Systems Denies LOC and weight change.     Objective:   Physical Exam VITAL SIGNS:  See vs page GENERAL: no distress Pulses: dorsalis pedis intact bilat.   MSK: no deformity of the feet CV: no leg edema Skin:  no ulcer on the feet.  normal color and temp on the feet. Neuro: sensation is intact to touch on the feet Ext: There is bilateral onychomycosis of the toenails  Lab Results  Component Value Date   HGBA1C 6.8* 08/19/2014       Assessment & Plan:  DM: overcontrolled  Patient is advised the following: Patient Instructions  Please come back for a follow-up appointment in 3 months.  on this type of insulin, it is really important to eat lunch no later than 4 hrs after breakfast.  Otherwise, your blood sugar could go low.   check your blood sugar twice a day.  vary the time of day when you check, between before the 3 meals, and at bedtime.  also check if you have symptoms of your blood sugar being too high or too low.  please keep a record of the readings and bring it to your next appointment here.  please call us sooner if your blood sugar goes below 70, or if it stays over 200.    blood  tests are requested for you today.  We'll let you know about the results. We'll adjust the insulin according the results.      Addendum: reduce insulin to 60 units in the morning and 30 units in the evening.

## 2014-08-19 NOTE — Patient Instructions (Addendum)
Please come back for a follow-up appointment in 3 months.  on this type of insulin, it is really important to eat lunch no later than 4 hrs after breakfast.  Otherwise, your blood sugar could go low.   check your blood sugar twice a day.  vary the time of day when you check, between before the 3 meals, and at bedtime.  also check if you have symptoms of your blood sugar being too high or too low.  please keep a record of the readings and bring it to your next appointment here.  please call us sooner if your blood sugar goes below 70, or if it stays over 200.    blood tests are requested for you today.  We'll let you know about the results. We'll adjust the insulin according the results.

## 2014-08-21 ENCOUNTER — Ambulatory Visit: Payer: Medicare Other | Admitting: Endocrinology

## 2014-08-23 ENCOUNTER — Ambulatory Visit: Payer: Medicare Other | Admitting: Family Medicine

## 2014-08-29 ENCOUNTER — Other Ambulatory Visit: Payer: Self-pay | Admitting: Internal Medicine

## 2014-09-09 ENCOUNTER — Ambulatory Visit: Payer: Medicare Other | Admitting: Family Medicine

## 2014-09-10 ENCOUNTER — Ambulatory Visit: Payer: Medicare Other | Admitting: Family Medicine

## 2014-09-23 ENCOUNTER — Ambulatory Visit: Payer: Medicare Other | Admitting: Family Medicine

## 2014-09-27 ENCOUNTER — Telehealth: Payer: Self-pay | Admitting: Endocrinology

## 2014-09-27 NOTE — Telephone Encounter (Signed)
Please call pt and let her know if the paperwork that was sent by [facility] was send to [place]. Please call pt with answer

## 2014-09-30 ENCOUNTER — Other Ambulatory Visit: Payer: Self-pay

## 2014-09-30 NOTE — Telephone Encounter (Signed)
I contacted the pt and left a voicemail advising we have not received any paper work from [facility] as of 09/30/2014.

## 2014-10-11 ENCOUNTER — Other Ambulatory Visit: Payer: Self-pay

## 2014-10-11 DIAGNOSIS — Z1231 Encounter for screening mammogram for malignant neoplasm of breast: Secondary | ICD-10-CM

## 2014-10-21 ENCOUNTER — Ambulatory Visit: Payer: Medicare Other | Admitting: Family Medicine

## 2014-10-22 ENCOUNTER — Ambulatory Visit (INDEPENDENT_AMBULATORY_CARE_PROVIDER_SITE_OTHER): Payer: Medicare Other | Admitting: Family Medicine

## 2014-10-22 ENCOUNTER — Other Ambulatory Visit (INDEPENDENT_AMBULATORY_CARE_PROVIDER_SITE_OTHER): Payer: Medicare Other

## 2014-10-22 ENCOUNTER — Encounter: Payer: Self-pay | Admitting: Family Medicine

## 2014-10-22 VITALS — BP 136/82 | HR 90 | Wt 276.0 lb

## 2014-10-22 DIAGNOSIS — M25552 Pain in left hip: Secondary | ICD-10-CM

## 2014-10-22 DIAGNOSIS — M7062 Trochanteric bursitis, left hip: Secondary | ICD-10-CM

## 2014-10-22 NOTE — Progress Notes (Signed)
[name] [address] [place], [place] [address] Phone: [phone] Subjective:     CC: Continued lateral hip pain  WIO:[id number] Brittany Acosta is a 64 y.o. female coming in with complaint of continued lateral hip pain. She has been seen previously in the used to respond well to greater trochanteric bursitis injections. Patient though was not responding at last follow-up 6 months ago. Patient did have back x-rays at that time. Patient's back x-rays show patient having narrowing at L4-L5 and degenerative disc disease at L5-S1. Patient states the pain is still localized on the lateral aspect the leg. Mild radiation down the lateral aspect of the leg as well. Does not pass her knee. An states though that it is affecting her daily activities. Making it difficult to even sleep at night. Patient states that this does not seem to be new symptoms is worsening a previous symptoms. Patient is on gabapentin 300 mg but has not notice any significant improvement with this medication.  .   Body mass index is 48.9 kg/(m^2).   Past medical history, social, surgical and family history all reviewed in electronic medical record.   Review of Systems: No headache, visual changes, nausea, vomiting, diarrhea, constipation, dizziness, abdominal pain, skin rash, fevers, chills, night sweats, weight loss, swollen lymph nodes, body aches, joint swelling, muscle aches, chest pain, shortness of breath, mood changes.   Objective Blood pressure 136/82, pulse 90, weight 276 lb (125.193 kg), SpO2 97 %.  General: No apparent distress alert and oriented x3 mood and affect normal, dressed appropriately.  HEENT: Pupils equal, extraocular movements intact  Respiratory: Patient's speak in full sentences and does not appear short of breath  Cardiovascular: 2+ lower extremity edema, non tender, no erythema  Skin: Warm dry intact with no signs of infection or rash on extremities or on axial  skeleton.  Abdomen: Soft nontender  Neuro: Cranial nerves II through XII are intact, neurovascularly intact in all extremities with 2+ DTRs and 2+ pulses.  Lymph: No lymphadenopathy of posterior or anterior cervical chain or axillae bilaterally.  Gait normal with good balance and coordination.  MSK:  Non tender with full range of motion and good stability and symmetric strength and tone of shoulders, elbows, wrist, , knee and ankles bilaterally.  F    Back Exam:  Inspection: Mild increase in lordosis secondary to patient's body habitus Motion: Flexion 45 deg, Extension 25 deg, Side Bending to 35 deg bilaterally,  Rotation to 35 deg bilaterally  SLR laying: Positive XSLR laying: Negative  Palpable tenderness: moderate pain over the L5-S1 area on left. FABER: negative. Sensory change: Gross sensation intact to all lumbar and sacral dermatomes.  Reflexes: 2+ at both patellar tendons, 2+ at achilles tendons, Babinski's downgoing.  Strength at foot  Plantar-flexion: 5/5 Dorsi-flexion: 5/5 Eversion: 5/5 Inversion: 5/5  Leg strength  Quad: 3/5 Hamstring: 5/5 Hip flexor: 3/5 Hip abductors: 3/5 but symmetric compared to contralateral side Gait unremarkable. Still significantly tender over the lateral aspect of the hip   Procedure: Real-time Ultrasound Guided Injection of left  greater trochanteric bursitis secondary to patient's body habitus Device: GE Logiq E  Ultrasound guided injection is preferred based studies that show increased duration, increased effect, greater accuracy, decreased procedural pain, increased response rate, and decreased cost with ultrasound guided versus blind injection.  Verbal informed consent obtained.  Time-out conducted.  Noted no overlying erythema, induration, or other signs of local infection.  Skin prepped in a sterile fashion.  Local anesthesia: Topical Ethyl chloride.  With sterile technique and under real time ultrasound guidance:  Greater trochanteric  area was visualized and patient's bursa was noted. A 22-gauge 3 inch needle was inserted and 4 cc of 0.5% Marcaine and 1 cc of Kenalog 40 mg/dL was injected. Pictures taken Completed without difficulty  Pain immediately resolved suggesting accurate placement of the medication.  Advised to call if fevers/chills, erythema, induration, drainage, or persistent bleeding.  Images permanently stored and available for review in the ultrasound unit.  Impression: Technically successful ultrasound guided injection.  Impression and Recommendations:

## 2014-10-22 NOTE — Assessment & Plan Note (Signed)
She was given an injection today and tolerated the procedure very well. We discussed icing regimen and home exercises. Patient has many other comorbidities a could be contributing. I do believe that lumbar radiculopathy is within the differential. With patient having history of this previously and patient's x-ray showing severe osteophytic changes at L4-L5 which would correspond with her symptoms I do feel that if patient does not make significant improvement with this injection and maintenance imaging is warranted. Patient knows to call if any weakness or numbness occurs in the leg or seek medical attention immediately. Patient otherwise will follow-up with me again depending on how she responds to the injection.  Spent  25 minutes with patient face-to-face and had greater than 50% of counseling including as described above in assessment and plan.

## 2014-10-22 NOTE — Patient Instructions (Addendum)
Good to see you Ice will always help Love the pool Exercises on wall.  Heel and butt touching.  Raise leg 6 inches and hold 2 seconds.  Down slow for count of 4 seconds.  1 set of 30 reps daily on both sides. And can do in the pool Call me in 1 week and if not better we will order MRI of the back.

## 2014-10-29 ENCOUNTER — Other Ambulatory Visit: Payer: Self-pay

## 2014-10-29 ENCOUNTER — Telehealth: Payer: Self-pay | Admitting: Internal Medicine

## 2014-10-29 ENCOUNTER — Other Ambulatory Visit: Payer: Self-pay | Admitting: Internal Medicine

## 2014-10-29 ENCOUNTER — Telehealth: Payer: Self-pay | Admitting: Family Medicine

## 2014-10-29 MED ORDER — LEVOTHYROXINE SODIUM 50 MCG PO TABS: 50.0000 ug | ORAL_TABLET | Freq: Every day | ORAL | Status: DC

## 2014-10-29 MED ORDER — ATORVASTATIN CALCIUM 10 MG PO TABS: 10.0000 mg | ORAL_TABLET | Freq: Every day | ORAL | Status: DC

## 2014-10-29 MED ORDER — FUROSEMIDE 40 MG PO TABS: 40.0000 mg | ORAL_TABLET | Freq: Every day | ORAL | Status: DC

## 2014-10-29 MED ORDER — ESTRADIOL 0.5 MG PO TABS: 0.5000 mg | ORAL_TABLET | Freq: Every day | ORAL | Status: DC

## 2014-10-29 NOTE — Telephone Encounter (Signed)
lmovm making pt aware i'm faxing the paperwork now to requested fax number.

## 2014-10-29 NOTE — Telephone Encounter (Signed)
Pt rx sent to mail order pharmacy.

## 2014-10-29 NOTE — Telephone Encounter (Signed)
Patient would like to know if her forms for [facility] are ready, please advise Fax to [phone]

## 2014-10-29 NOTE — Telephone Encounter (Signed)
Patient need refill on medication, send to [facility] [id number] furosemide (LASIX) 40 MG tablet estradiol (ESTRACE) 0.5 MG tablet levothyroxine (SYNTHROID) 50 MCG tablet furosemide (LASIX) 40 MG tablet Omeprazole 20 mg

## 2014-11-04 ENCOUNTER — Telehealth: Payer: Self-pay | Admitting: Family Medicine

## 2014-11-04 NOTE — Telephone Encounter (Signed)
Pt scheduled tomorrow @ 9am.

## 2014-11-04 NOTE — Telephone Encounter (Signed)
Patient states she fell and hit her tale bone.  She would like a referral to an orthopedic for this issue. This is a new issue.  She wanted to get in today but there are no openings.  Please call patient back in regards.

## 2014-11-05 ENCOUNTER — Ambulatory Visit (INDEPENDENT_AMBULATORY_CARE_PROVIDER_SITE_OTHER): Payer: Medicare Other | Admitting: Family Medicine

## 2014-11-05 ENCOUNTER — Encounter: Payer: Self-pay | Admitting: Family Medicine

## 2014-11-05 ENCOUNTER — Other Ambulatory Visit (INDEPENDENT_AMBULATORY_CARE_PROVIDER_SITE_OTHER): Payer: Medicare Other

## 2014-11-05 VITALS — BP 126/84 | HR 73 | Ht 63.0 in | Wt 273.0 lb

## 2014-11-05 DIAGNOSIS — S3992XA Unspecified injury of lower back, initial encounter: Secondary | ICD-10-CM | POA: Diagnosis not present

## 2014-11-05 DIAGNOSIS — M25552 Pain in left hip: Secondary | ICD-10-CM | POA: Diagnosis not present

## 2014-11-05 MED ORDER — TRAMADOL HCL 50 MG PO TABS: 50.0000 mg | ORAL_TABLET | Freq: Three times a day (TID) | ORAL | Status: DC

## 2014-11-05 NOTE — Progress Notes (Signed)
  [name] [place] [place], [place] [address] Phone: [phone] Subjective:    CC: Continued lateral hip pain  GYI:RSWNIOEVOJ Brittany Acosta is a 64 y.o. female coming in with complaint of continued back pain and hip pain. Patient did have a fall on her tailbone this weekend. Patient does have a history of degenerative disc disease that is severe at L4-S1. Patient was seen by me 2 weeks ago and was given an injection in the lateral aspect of the hip 2 weeks ago. This is for a presumed greater trochanteric bursitis. Patient states the lateral hip started hurting after she fell. Patient fell in the stand on her buttocks. States though that she has had severe pain in his been unable to sit on a regular basis. Denies any bowel or bladder changes, states no significant radiation down the leg but is having more of the discomfort on the lateral aspect of the hip again. Continues to gabapentin 300 mg in a.m. and p.m. and 600 mg at night.  .   Body mass index is 48.37 kg/(m^2).   Past medical history, social, surgical and family history all reviewed in electronic medical record.   Review of Systems: No headache, visual changes, nausea, vomiting, diarrhea, constipation, dizziness, abdominal pain, skin rash, fevers, chills, night sweats, weight loss, swollen lymph nodes, body aches, joint swelling, muscle aches, chest pain, shortness of breath, mood changes.   Objective Blood pressure 126/84, pulse 73, height 5\' 3"  (1.6 m), weight 273 lb (123.832 kg), SpO2 97 %.  General: No apparent distress alert and oriented x3 mood and affect normal, dressed appropriately.  HEENT: Pupils equal, extraocular movements intact  Respiratory: Patient's speak in full sentences and does not appear short of breath  Cardiovascular: 2+ lower extremity edema, non tender, no erythema  Skin: Warm dry intact with no signs of infection or rash on extremities or on axial skeleton.  Abdomen: Soft  nontender  Neuro: Cranial nerves II through XII are intact, neurovascularly intact in all extremities with 2+ DTRs and 2+ pulses.  Lymph: No lymphadenopathy of posterior or anterior cervical chain or axillae bilaterally.  Gait normal with good balance and coordination.  MSK:  Non tender with full range of motion and good stability and symmetric strength and tone of shoulders, elbows, wrist, , knee and ankles bilaterally.    Back Exam:  Inspection: Mild increase in lordosis secondary to patient's body habitus Motion: Flexion 45 deg, Extension 25 deg, Side Bending to 35 deg bilaterally,  Rotation to 35 deg bilaterally  SLR laying: Negative which is improvement XSLR laying: Negative  Palpable tenderness: moderate pain over the L5-S1 area on left. FABER: negative. Sensory change: Gross sensation intact to all lumbar and sacral dermatomes.  Reflexes: 2+ at both patellar tendons, 2+ at achilles tendons, Babinski's downgoing.  Strength at foot  Plantar-flexion: 5/5 Dorsi-flexion: 5/5 Eversion: 5/5 Inversion: 5/5  Leg strength  Quad: 3/5 Hamstring: 5/5 Hip flexor: 3/5 Hip abductors: 3/5 but symmetric compared to contralateral side Gait unremarkable. Moderately tender on the lateral aspect of the hip one tenderness over the coccyx but no spinous process tenderness of the lumbar spine.     Impression and Recommendations:

## 2014-11-05 NOTE — Progress Notes (Signed)
Pre visit review using our clinic review tool, if applicable. No additional management support is needed unless otherwise documented below in the visit note. 

## 2014-11-05 NOTE — Patient Instructions (Signed)
Good to see you Brittany Acosta is your friend 10 minutes at night The pennsaid on the back side can help Tramadol and 1 tylenol up to 3 times daily Try a towel beneath knees with sitting See me again in 2 weeks when you come back.

## 2014-11-05 NOTE — Assessment & Plan Note (Signed)
Patient did fall on and has a possibility fracture but I do not feel that imaging is warranted at this time. No radicular symptoms with patient has had previously. Patient does have severe osteophytic changes. No spinous process tenderness which is significant this time. Even if patient does have a fracture that this would not change management. Patient will do tramadol and Tylenol for breakthrough pain as well as icing and topical anti-inflammatory.  Patient will come back and see me again in 2 weeks to make sure she is improving.

## 2014-11-18 ENCOUNTER — Ambulatory Visit: Payer: Medicare Other | Admitting: Endocrinology

## 2014-11-18 ENCOUNTER — Ambulatory Visit: Payer: Medicare Other | Admitting: Family Medicine

## 2014-11-18 ENCOUNTER — Ambulatory Visit: Payer: Medicare Other

## 2014-11-19 ENCOUNTER — Ambulatory Visit: Payer: Medicare Other | Admitting: Endocrinology

## 2014-11-20 ENCOUNTER — Ambulatory Visit: Payer: Medicare Other | Admitting: Endocrinology

## 2014-12-17 ENCOUNTER — Ambulatory Visit: Payer: Medicare Other | Admitting: Endocrinology

## 2014-12-17 ENCOUNTER — Ambulatory Visit: Payer: Medicare Other | Admitting: Family Medicine

## 2014-12-17 ENCOUNTER — Ambulatory Visit: Payer: Medicare Other

## 2014-12-18 ENCOUNTER — Ambulatory Visit (INDEPENDENT_AMBULATORY_CARE_PROVIDER_SITE_OTHER): Payer: Medicare Other | Admitting: Endocrinology

## 2014-12-18 ENCOUNTER — Encounter: Payer: Self-pay | Admitting: Endocrinology

## 2014-12-18 VITALS — BP 180/92 | HR 89 | Temp 97.7°F | Ht 63.0 in | Wt 279.0 lb

## 2014-12-18 DIAGNOSIS — Z23 Encounter for immunization: Secondary | ICD-10-CM

## 2014-12-18 DIAGNOSIS — E1049 Type 1 diabetes mellitus with other diabetic neurological complication: Secondary | ICD-10-CM

## 2014-12-18 DIAGNOSIS — M25569 Pain in unspecified knee: Secondary | ICD-10-CM | POA: Diagnosis not present

## 2014-12-18 DIAGNOSIS — E1065 Type 1 diabetes mellitus with hyperglycemia: Secondary | ICD-10-CM

## 2014-12-18 DIAGNOSIS — I10 Essential (primary) hypertension: Secondary | ICD-10-CM | POA: Diagnosis not present

## 2014-12-18 DIAGNOSIS — E1041 Type 1 diabetes mellitus with diabetic mononeuropathy: Secondary | ICD-10-CM

## 2014-12-18 DIAGNOSIS — IMO0002 Reserved for concepts with insufficient information to code with codable children: Secondary | ICD-10-CM

## 2014-12-18 LAB — POCT GLYCOSYLATED HEMOGLOBIN (HGB A1C): Hemoglobin A1C: 7.1

## 2014-12-18 MED ORDER — INSULIN ISOPHANE HUMAN 100 UNIT/ML KWIKPEN: PEN_INJECTOR | SUBCUTANEOUS | Status: DC

## 2014-12-18 NOTE — Patient Instructions (Addendum)
Please come back for a follow-up appointment in 4 months.  on this type of insulin, it is really important to eat lunch no later than 4 hrs after breakfast.  Otherwise, your blood sugar could go low.   check your blood sugar twice a day.  vary the time of day when you check, between before the 3 meals, and at bedtime.  also check if you have symptoms of your blood sugar being too high or too low.  please keep a record of the readings and bring it to your next appointment here.  please call us sooner if your blood sugar goes below 70, or if it stays over 200.    We'll adjust the insulin according the results.   The effect of the steroid on your blood sugar will start approx 1 day after starting, and end about 1 week after finishing.  During this time, take an extra 10 units of insulin for a blood sugar over 250.  Call if any problems, or if this doesn't help.

## 2014-12-18 NOTE — Progress Notes (Signed)
Subjective:    Patient ID: Brittany Acosta, female    DOB: 28-Jun-1950, 64 y.o.   MRN: [id number]  HPI Pt returns for f/u of diabetes mellitus: DM type: Insulin-requiring type 2 Dx'ed: 9622 Complications: neuropathy Therapy: insulin since 2004 GDM: never.  DKA: never. Severe hypoglycemia: never. Pancreatitis: never.  Other: in 2012, she was changed to a simple BID insulin regimen, after she did not get good control on multiple daily injections; she says she can only afford inexpensive human insulin; she declines weight-loss surgery.   Interval history: She had an episode of mild hypoglycemia today, after lunch was delayed.  no cbg record, but states cbg's are well-controlled.  She says she has mild hypoglycemia approx once per week, usually at lunch. She is about to start a course of steroids for knee pain Past Medical History  Diagnosis Date  . GOITER, MULTINODULAR   . HYPOTHYROIDISM   . HYPERCHOLESTEROLEMIA   . Morbid obesity   . BIPOLAR DISORDER UNSPECIFIED     depression  . Chronic diastolic heart failure   . ALLERGIC RHINITIS   . FIBROMYALGIA   . GERD (gastroesophageal reflux disease)   . History of wrist fracture     rt wrist  . Depression   . Type II diabetes mellitus   . Neuropathy due to secondary diabetes   . Iron deficiency anemia   . Kidney stones     sees urologist @ [facility]  . History of gout     "haven't had it in several years" (02/08/2012)  . PTSD (post-traumatic stress disorder)   . Wears glasses   . Sleep apnea 7/14    mild osa-did not need cpap -dr Gwenette Greet  . PONV (postoperative nausea and vomiting)   . RBBB     Past Surgical History  Procedure Laterality Date  . Cholecystectomy  1985  . Abdominal hysterectomy  1976  . Tubal ligation  1972  . Right nasal surgery  08/1988  . Right sinus removed  08/1989    tooth partial  . Percitania stone removed (l) kidney  1992  . Lithotripsy (l) kidney  1997  . Left cystoscopy   1990  . Thyroidectomy  04/22/2011      Procedure: THYROIDECTOMY;  Surgeon: Onnie Graham, MD;  Location: [facility];  Service: ENT;  Laterality: N/A;  TOTAL THYROIDECOTMY  . Cataract extraction, bilateral  02/2011    epps  . Total knee arthroplasty  06/18/2011    Procedure: TOTAL KNEE ARTHROPLASTY;  Surgeon: Kerin Salen, MD;  Location: [facility];  Service: Orthopedics;  Laterality: Left;  DEPUY  . Total knee arthroplasty  02/07/2012    Procedure: TOTAL KNEE ARTHROPLASTY;  Surgeon: Kerin Salen, MD;  Location: [facility];  Service: Orthopedics;  Laterality: Right;  . Incisional breast biopsy  2000    right  . Cardiac catheterization  2001    sees Dr Peter Martinique  . Knee arthroscopy  08/2009    right  . Knee arthroscopy with medial menisectomy      left  . Excisional hemorrhoidectomy      "dr cut out in his office" (02/08/2012)  . Left oophorectomy  1980  . Rotator cuff repair  2013    right shoulder x 2  . Joint replacement      rt knee  . Partial mastectomy with needle localization Left 09/01/2012    Procedure: PARTIAL MASTECTOMY WITH NEEDLE LOCALIZATION;  Surgeon: Adin Hector, MD;  Location: [facility];  Service:  General;  Laterality: Left;  . Cataract extraction  2014  . Shoulder arthroscopy with rotator cuff repair and subacromial decompression Left 08/20/2013    Procedure: SHOULDER ARTHROSCOPY  AND SUBACROMIAL DECOMPRESSION;  Surgeon: Nita Sells, MD;  Location: [facility];  Service: Orthopedics;  Laterality: Left;  Left shoulder arthroscopy, debridement, subacromial decompression, distal clavical resection    Social History   Social History  . Marital Status: Married    Spouse Name: N/A  . Number of Children: 2  . Years of Education: 14   Occupational History  . disability    Social History Main Topics  . Smoking status: Former Smoker -- 1.00 packs/day for 6 years    Types: Cigarettes    Quit date: 04/06/1983  . Smokeless tobacco: Never Used     Comment: Married, lives with spouse. Disable-  2 grown kids-6 g-kids  . Alcohol Use: No     Comment: none  . Drug Use: No  . Sexual Activity: Not Currently   Other Topics Concern  . Not on file   Social History Narrative   Married, lives with spouse - 2 adult children   diabled    Current Outpatient Prescriptions on File Prior to Visit  Medication Sig Dispense Refill  . Alpha-Lipoic Acid 300 MG TABS Take 300 mg by mouth daily.     . ARIPiprazole (ABILIFY) 20 MG tablet Take 5 mg by mouth every morning.     Marland Kitchen aspirin 81 MG tablet Take 81 mg by mouth daily.    Marland Kitchen atorvastatin (LIPITOR) 10 MG tablet Take 1 tablet (10 mg total) by mouth daily. 90 tablet 3  . Biotin 10 MG CAPS Take 1 capsule by mouth daily.    . Calcium Carbonate (CALCIUM 600 PO) Take 2 tablets by mouth daily.    . clonazePAM (KLONOPIN) 1 MG tablet Take 3 mg by mouth at bedtime.     . Coenzyme Q10 120 MG CAPS Take 120 mg by mouth daily.    Marland Kitchen doxepin (SINEQUAN) 10 MG capsule Take 10 mg by mouth at bedtime.     Marland Kitchen estradiol (ESTRACE) 0.5 MG tablet Take 1 tablet (0.5 mg total) by mouth daily. 90 tablet 1  . furosemide (LASIX) 40 MG tablet Take 1 tablet (40 mg total) by mouth daily. 90 tablet 3  . gabapentin (NEURONTIN) 300 MG capsule Take 2 capsules (600 mg total) by mouth 3 (three) times daily. 180 capsule 5  . hydrochlorothiazide 25 MG tablet Take 25 mg by mouth every morning.     . Insulin Pen Needle (B-D UF III MINI PEN NEEDLES) 31G X 5 MM MISC Use pen needles with kwik to inject insulin into skin twice a day. 180 each 3  . lamoTRIgine (LAMICTAL) 200 MG tablet Take 200 mg by mouth daily at 12 noon.     Marland Kitchen levothyroxine (SYNTHROID) 50 MCG tablet Take 1 tablet (50 mcg total) by mouth daily. 90 tablet 3  . Magnesium 200 MG TABS Take 1 tablet by mouth daily.    Marland Kitchen MANGANESE PO Take by mouth. 40mg  daily.    . Melatonin 3 MG CAPS Take 1 capsule by mouth at bedtime.    . Multiple Vitamin (MULTIVITAMIN WITH MINERALS) TABS Take 1 tablet by mouth every evening.    . NON  FORMULARY Tumeric-720mg  2 times daily.    Marland Kitchen oxybutynin (DITROPAN-XL) 10 MG 24 hr tablet Take 10 mg by mouth every evening.     . potassium citrate (UROCIT-K 10)  10 MEQ (1080 MG) SR tablet Take 20 mEq by mouth 2 (two) times daily.     . prazosin (MINIPRESS) 2 MG capsule Take 3 mg by mouth at bedtime.    . Thiamine HCl (VITAMIN B-1) 100 MG tablet Take 100 mg by mouth every evening.     . traMADol (ULTRAM) 50 MG tablet Take 1 tablet (50 mg total) by mouth 3 (three) times daily. 30 tablet 0  . Vortioxetine HBr (BRINTELLIX) 10 MG TABS Take 1 tablet by mouth every morning. 20 mg     No current facility-administered medications on file prior to visit.    No Known Allergies  Family History  Problem Relation Age of Onset  . Hypertension Mother   . Diabetes Father   . Hypertension Father   . Hyperlipidemia Father   . Heart attack Other   . Coronary artery disease Other     BP 180/92 mmHg  Pulse 89  Temp(Src) 97.7 F (36.5 C) (Oral)  Ht 5\' 3"  (1.6 m)  Wt 279 lb (126.554 kg)  BMI 49.44 kg/m2  SpO2 93%  Review of Systems Denies LOC    Objective:   Physical Exam VITAL SIGNS:  See vs page GENERAL: no distress Pulses: dorsalis pedis intact bilat.   MSK: no deformity of the feet CV: no leg edema Skin:  no ulcer on the feet.  normal color and temp on the feet. Neuro: sensation is intact to touch on the feet   Lab Results  Component Value Date   HGBA1C 7.1 12/18/2014      Assessment & Plan:  DM: this is the best control this pt should aim for, given this regimen, which does match insulin to her changing needs throughout the day HTN, worse, but prob exac by episode of hypoglycemia today. Knee pain.  She will have a course of steroids soon.    Patient is advised the following: Patient Instructions  Please come back for a follow-up appointment in 4 months.  on this type of insulin, it is really important to eat lunch no later than 4 hrs after breakfast.  Otherwise, your blood  sugar could go low.   check your blood sugar twice a day.  vary the time of day when you check, between before the 3 meals, and at bedtime.  also check if you have symptoms of your blood sugar being too high or too low.  please keep a record of the readings and bring it to your next appointment here.  please call us sooner if your blood sugar goes below 70, or if it stays over 200.    We'll adjust the insulin according the results.   The effect of the steroid on your blood sugar will start approx 1 day after starting, and end about 1 week after finishing.  During this time, take an extra 10 units of insulin for a blood sugar over 250.  Call if any problems, or if this doesn't help.

## 2014-12-31 ENCOUNTER — Ambulatory Visit: Payer: Medicare Other | Admitting: Family Medicine

## 2015-01-03 ENCOUNTER — Other Ambulatory Visit: Payer: Self-pay | Admitting: *Deleted

## 2015-01-03 ENCOUNTER — Other Ambulatory Visit: Payer: Self-pay | Admitting: Internal Medicine

## 2015-01-03 ENCOUNTER — Telehealth: Payer: Self-pay | Admitting: *Deleted

## 2015-01-03 MED ORDER — OMEPRAZOLE 20 MG PO CPDR: 20.0000 mg | DELAYED_RELEASE_CAPSULE | Freq: Two times a day (BID) | ORAL | Status: DC

## 2015-01-03 NOTE — Telephone Encounter (Signed)
Receive call pt states she is needing omeprazole sent to [facility]....Brittany Acosta

## 2015-02-10 ENCOUNTER — Ambulatory Visit: Payer: Medicare Other

## 2015-02-13 ENCOUNTER — Ambulatory Visit: Payer: Medicare Other

## 2015-03-13 ENCOUNTER — Encounter: Payer: Self-pay | Admitting: Endocrinology

## 2015-03-13 ENCOUNTER — Ambulatory Visit (INDEPENDENT_AMBULATORY_CARE_PROVIDER_SITE_OTHER): Payer: Medicare Other | Admitting: Endocrinology

## 2015-03-13 VITALS — BP 143/66 | HR 88 | Temp 97.8°F | Ht 63.0 in | Wt 274.0 lb

## 2015-03-13 DIAGNOSIS — E1049 Type 1 diabetes mellitus with other diabetic neurological complication: Secondary | ICD-10-CM

## 2015-03-13 DIAGNOSIS — IMO0002 Reserved for concepts with insufficient information to code with codable children: Secondary | ICD-10-CM

## 2015-03-13 DIAGNOSIS — E1065 Type 1 diabetes mellitus with hyperglycemia: Secondary | ICD-10-CM | POA: Diagnosis not present

## 2015-03-13 LAB — POCT GLYCOSYLATED HEMOGLOBIN (HGB A1C): Hemoglobin A1C: 7.3

## 2015-03-13 NOTE — Patient Instructions (Signed)
Please continue the same insulin. check your blood sugar twice a day.  vary the time of day when you check, between before the 3 meals, and at bedtime.  also check if you have symptoms of your blood sugar being too high or too low.  please keep a record of the readings and bring it to your next appointment here (or you can bring the meter itself).  You can write it on any piece of paper.  please call us sooner if your blood sugar goes below 70, or if you have a lot of readings over 200. Please come back for a follow-up appointment in 3 months. Please see your PCP about your blood pressure.

## 2015-03-13 NOTE — Progress Notes (Signed)
Subjective:    Patient ID: Brittany Acosta, female    DOB: 01/19/1951, 64 y.o.   MRN: [id number]  HPI Pt returns for f/u of diabetes mellitus: DM type: Insulin-requiring type 2 Dx'ed: [id number] Complications: polyneuropathy and bilat charcot feet.   Therapy: insulin since 2004 GDM: never.  DKA: never. Severe hypoglycemia: never. Pancreatitis: never.  Other: in 2012, she was changed to a simple BID insulin regimen, after she did not get good control on multiple daily injections; she says she can only afford inexpensive human insulin; she declines weight-loss surgery.   Interval history: she brings a record of her cbg's which i have reviewed today. It varies from 97-200's.  There is no trend throughout the day.  She has recently been dx'ed with bilat charcot feet.   Past Medical History  Diagnosis Date  . GOITER, MULTINODULAR   . HYPOTHYROIDISM   . HYPERCHOLESTEROLEMIA   . Morbid obesity ([facility])   . BIPOLAR DISORDER UNSPECIFIED     depression  . Chronic diastolic heart failure ([facility])   . ALLERGIC RHINITIS   . FIBROMYALGIA   . GERD (gastroesophageal reflux disease)   . History of wrist fracture     rt wrist  . Depression   . Type II diabetes mellitus ([facility])   . Neuropathy due to secondary diabetes ([facility])   . Iron deficiency anemia   . Kidney stones     sees urologist @ [facility]  . History of gout     "haven't had it in several years" (02/08/2012)  . PTSD (post-traumatic stress disorder)   . Wears glasses   . Sleep apnea 7/14    mild osa-did not need cpap -dr Gwenette Greet  . PONV (postoperative nausea and vomiting)   . RBBB     Past Surgical History  Procedure Laterality Date  . Cholecystectomy  1985  . Abdominal hysterectomy  1976  . Tubal ligation  1972  . Right nasal surgery  08/1988  . Right sinus removed  08/1989    tooth partial  . Percitania stone removed (l) kidney  1992  . Lithotripsy (l) kidney  1997  . Left cystoscopy   1990  . Thyroidectomy  04/22/2011    Procedure:  THYROIDECTOMY;  Surgeon: Onnie Graham, MD;  Location: [facility];  Service: ENT;  Laterality: N/A;  TOTAL THYROIDECOTMY  . Cataract extraction, bilateral  02/2011    epps  . Total knee arthroplasty  06/18/2011    Procedure: TOTAL KNEE ARTHROPLASTY;  Surgeon: Kerin Salen, MD;  Location: [facility];  Service: Orthopedics;  Laterality: Left;  DEPUY  . Total knee arthroplasty  02/07/2012    Procedure: TOTAL KNEE ARTHROPLASTY;  Surgeon: Kerin Salen, MD;  Location: [facility];  Service: Orthopedics;  Laterality: Right;  . Incisional breast biopsy  2000    right  . Cardiac catheterization  2001    sees Dr Peter Martinique  . Knee arthroscopy  08/2009    right  . Knee arthroscopy with medial menisectomy      left  . Excisional hemorrhoidectomy      "dr cut out in his office" (02/08/2012)  . Left oophorectomy  1980  . Rotator cuff repair  2013    right shoulder x 2  . Joint replacement      rt knee  . Partial mastectomy with needle localization Left 09/01/2012    Procedure: PARTIAL MASTECTOMY WITH NEEDLE LOCALIZATION;  Surgeon: Adin Hector, MD;  Location: [facility];  Service: General;  Laterality: Left;  . Cataract extraction  2014  . Shoulder arthroscopy with rotator cuff repair and subacromial decompression Left 08/20/2013    Procedure: SHOULDER ARTHROSCOPY  AND SUBACROMIAL DECOMPRESSION;  Surgeon: Nita Sells, MD;  Location: [facility];  Service: Orthopedics;  Laterality: Left;  Left shoulder arthroscopy, debridement, subacromial decompression, distal clavical resection    Social History   Social History  . Marital Status: Married    Spouse Name: N/A  . Number of Children: 2  . Years of Education: 14   Occupational History  . disability    Social History Main Topics  . Smoking status: Former Smoker -- 1.00 packs/day for 6 years    Types: Cigarettes    Quit date: 04/06/1983  . Smokeless tobacco: Never Used     Comment: Married, lives with spouse. Disable- 2 grown kids-6  g-kids  . Alcohol Use: No     Comment: none  . Drug Use: No  . Sexual Activity: Not Currently   Other Topics Concern  . Not on file   Social History Narrative   Married, lives with spouse - 2 adult children   diabled    Current Outpatient Prescriptions on File Prior to Visit  Medication Sig Dispense Refill  . Biotin 10 MG CAPS Take 1 capsule by mouth daily.    . cephALEXin (KEFLEX) 500 MG capsule Take 500 mg by mouth 4 (four) times daily.    . Coenzyme Q10 120 MG CAPS Take 120 mg by mouth daily.    Marland Kitchen doxepin (SINEQUAN) 10 MG capsule Take 10 mg by mouth at bedtime.     Marland Kitchen estradiol (ESTRACE) 0.5 MG tablet Take 1 tablet (0.5 mg total) by mouth daily. 90 tablet 1  . furosemide (LASIX) 40 MG tablet Take 1 tablet (40 mg total) by mouth daily. 90 tablet 3  . gabapentin (NEURONTIN) 300 MG capsule Take 2 capsules (600 mg total) by mouth 3 (three) times daily. 180 capsule 5  . hydrochlorothiazide 25 MG tablet Take 25 mg by mouth every morning.     . Insulin NPH, Human,, Isophane, (HUMULIN N KWIKPEN) 100 UNIT/ML Kiwkpen 55 units with breakfast, and 30 units with the evening meal. 105 mL 3  . Insulin Pen Needle (B-D UF III MINI PEN NEEDLES) 31G X 5 MM MISC Use pen needles with kwik to inject insulin into skin twice a day. 180 each 3  . lamoTRIgine (LAMICTAL) 200 MG tablet Take 200 mg by mouth daily at 12 noon.     Marland Kitchen levothyroxine (SYNTHROID) 50 MCG tablet Take 1 tablet (50 mcg total) by mouth daily. 90 tablet 3  . Melatonin 3 MG CAPS Take 1 capsule by mouth at bedtime.    . Multiple Vitamin (MULTIVITAMIN WITH MINERALS) TABS Take 1 tablet by mouth every evening.    Marland Kitchen oxybutynin (DITROPAN-XL) 10 MG 24 hr tablet Take 10 mg by mouth every evening.     . potassium citrate (UROCIT-K 10) 10 MEQ (1080 MG) SR tablet Take 20 mEq by mouth 2 (two) times daily.     . prazosin (MINIPRESS) 2 MG capsule Take 3 mg by mouth at bedtime.     No current facility-administered medications on file prior to visit.     No Known Allergies  Family History  Problem Relation Age of Onset  . Hypertension Mother   . Diabetes Father   . Hypertension Father   . Hyperlipidemia Father   . Heart attack Other   . Coronary artery disease  Other     BP 143/66 mmHg  Pulse 88  Temp(Src) 97.8 F (36.6 C) (Oral)  Ht 5\' 3"  (1.6 m)  Wt 274 lb (124.286 kg)  BMI 48.55 kg/m2  SpO2 97%  Review of Systems She denies hypoglycemia.      Objective:   Physical Exam VITAL SIGNS:  See vs page GENERAL: no distress SKIN:  Insulin injection sites at the anterior abdomen are normal, except for ecchymoses.     A1c=7.3%    Assessment & Plan:  DM: this is the best control this pt should aim for, given this regimen, which does match insulin to her changing needs throughout the day.   HTN: on rx  Patient is advised the following: Patient Instructions  Please continue the same insulin. check your blood sugar twice a day.  vary the time of day when you check, between before the 3 meals, and at bedtime.  also check if you have symptoms of your blood sugar being too high or too low.  please keep a record of the readings and bring it to your next appointment here (or you can bring the meter itself).  You can write it on any piece of paper.  please call us sooner if your blood sugar goes below 70, or if you have a lot of readings over 200. Please come back for a follow-up appointment in 3 months. Please see your PCP about your blood pressure.

## 2015-03-14 ENCOUNTER — Telehealth: Payer: Self-pay | Admitting: Internal Medicine

## 2015-03-14 ENCOUNTER — Telehealth: Payer: Self-pay | Admitting: Endocrinology

## 2015-03-14 NOTE — Telephone Encounter (Signed)
Yes, you do qualify

## 2015-03-14 NOTE — Telephone Encounter (Signed)
Patient Name: Brittany Acosta DOB: April 13, 1950 Initial Comment caller states her BP was 200/100 and is now 180/96 Nurse Assessment Nurse: Ronnald Ramp, RN, Miranda Date/Time (Eastern Time): 03/14/2015 11:57:17 AM Confirm and document reason for call. If symptomatic, describe symptoms. ---Caller states her BP [facility] the [facility]. She gets anxious when she has to drive. BP was 200/100 and is now 180/96. Has the patient traveled out of the country within the last 30 days? ---Not Applicable Does the patient have any new or worsening symptoms? ---Yes Will a triage be completed? ---Yes Related visit to physician within the last 2 weeks? ---No Does the PT have any chronic conditions? (i.e. diabetes, asthma, etc.) ---Yes List chronic conditions. ---Edema, diagnosed this week with Charcot (related to bones starting to crack), Diabetes, Neuropathy Is this a behavioral health or substance abuse call? ---No Guidelines Guideline Title Affirmed Question Affirmed Notes High Blood Pressure BP # 180/110 Final Disposition User See Physician within 24 Hours Ronnald Ramp, RN, Miranda Referrals [facility] Disagree/Comply: Comply  Appt scheduled for Sat clinic 12/10 at 0900. With Tommi Rumps

## 2015-03-14 NOTE — Telephone Encounter (Signed)
See note below and please advise, Thanks! 

## 2015-03-14 NOTE — Telephone Encounter (Signed)
Patient called stating that she would like to know if she is a candidate for diabetic shoes?  Please advise patient so she can call to get her Rx   Thank you

## 2015-03-14 NOTE — Telephone Encounter (Signed)
I contacted the pt and advised of note below. Pt voiced understanding and is going to try and find a place in [facility] to get her DM shoes from.

## 2015-03-15 ENCOUNTER — Encounter: Payer: Self-pay | Admitting: Family Medicine

## 2015-03-15 ENCOUNTER — Ambulatory Visit (INDEPENDENT_AMBULATORY_CARE_PROVIDER_SITE_OTHER): Payer: Medicare Other | Admitting: Family Medicine

## 2015-03-15 ENCOUNTER — Other Ambulatory Visit (HOSPITAL_COMMUNITY)
Admission: RE | Admit: 2015-03-15 | Discharge: 2015-03-15 | Disposition: A | Discharge status: Home / Self Care | Payer: Medicare Other | Source: Other Acute Inpatient Hospital | Attending: Internal Medicine | Admitting: Internal Medicine

## 2015-03-15 VITALS — BP 164/88 | HR 79 | Temp 97.5°F | Wt 274.0 lb

## 2015-03-15 DIAGNOSIS — R079 Chest pain, unspecified: Secondary | ICD-10-CM | POA: Insufficient documentation

## 2015-03-15 DIAGNOSIS — I10 Essential (primary) hypertension: Secondary | ICD-10-CM

## 2015-03-15 LAB — TROPONIN I: Troponin I: <0.03 ng/mL (ref <0.031)

## 2015-03-15 LAB — COMPREHENSIVE METABOLIC PANEL
ALT: 43 U/L (ref 14–54)
AST: 39 U/L (ref 15–41)
Albumin: 3.9 g/dL (ref 3.5–5.0)
Alkaline Phosphatase: 124 U/L (ref 38–126)
Anion gap: 7 (ref 5–15)
BUN: 17 mg/dL (ref 6–20)
CO2: 35 mmol/L — ABNORMAL HIGH (ref 22–32)
Calcium: 9.5 mg/dL (ref 8.9–10.3)
Chloride: 94 mmol/L — ABNORMAL LOW (ref 101–111)
Creatinine, Ser: 0.97 mg/dL (ref 0.44–1.00)
GFR calc Af Amer: >60 mL/min (ref >60)
GFR calc non Af Amer: >60 mL/min (ref >60)
Glucose, Bld: 245 mg/dL — ABNORMAL HIGH (ref 65–99)
Potassium: 3.5 mmol/L (ref 3.5–5.1)
Sodium: 136 mmol/L (ref 135–145)
Total Bilirubin: 0.5 mg/dL (ref 0.3–1.2)
Total Protein: 7.4 g/dL (ref 6.5–8.1)

## 2015-03-15 MED ORDER — LISINOPRIL 5 MG PO TABS: 5.0000 mg | ORAL_TABLET | Freq: Every day | ORAL | Status: DC

## 2015-03-15 NOTE — [facility] Notes (Signed)
Tommi Rumps, MD Phone: [phone]  Brittany Acosta is a 64 y.o. female who presents today for same-day visit.  Patient reports she was [facility] the dental [facility] yesterday and was noted to have a blood pressure of 200/100. They repeated it about 30 minutes later and was 186/102. She had no symptoms at that time. Later in the afternoon she developed chest pain over her right inferior sternum. This was a pressure sensation. Lasedt for several hours. It did not radiate. She had no diaphoresis. She had no shortness of breath. Chest pain resolved at 4 PM. She has not had any chest pain since then. She notes history of intermittent lower extremity swelling as well. This goes with propping her feet up. It is not worsened. She has no orthopnea. She did note mild throbbing headache with this. No vision changes, numbness, or weakness. No history of DVT or PE. No recent surgeries. No long trips.Denies cardiac history. She denies chest pain, shortness of breath, edema, headache, numbness, weakness, vision changes, or feeling poorly at this time.  PMH: Former Smoker   ROS history of present illness  Objective  Physical Exam Filed Vitals:   03/15/15 0904  BP: 164/88  Pulse: 79  Temp: 97.5 F (36.4 C)   Left arm BP 160/76 Right arm BP 164/80  BP Readings from Last 3 Encounters:  03/15/15 164/88  03/13/15 143/66  12/18/14 180/92   Wt Readings from Last 3 Encounters:  03/15/15 274 lb (124.286 kg)  03/13/15 274 lb (124.286 kg)  12/18/14 279 lb (126.554 kg)    Physical Exam  Constitutional: She is well-developed, well-nourished, and in no distress.  HENT:  Head: Normocephalic and atraumatic.  Mouth/Throat: Oropharynx is clear and moist. No oropharyngeal exudate.  Eyes: Conjunctivae are normal.  Neck: Neck supple.  Cardiovascular: Normal rate, regular rhythm and normal heart sounds.  Exam reveals no gallop and no friction rub.   No murmur heard. 2+ radial pulses  Pulmonary/Chest: Effort  normal and breath sounds normal. No respiratory distress. She has no wheezes. She has no rales.  Musculoskeletal: She exhibits no edema.  Lymphadenopathy:    She has no cervical adenopathy.  Neurological: She is alert. Gait normal.  CN 2-12 intact, 5/5 strength in bilateral biceps, triceps, grip, quads, hamstrings, plantar and dorsiflexion, sensation to light touch intact in bilateral UE and LE, normal gait, 2+ patellar reflexes  Skin: She is not diaphoretic.   EKG: Normal sinus rhythm, rate 81, right bundle-branch block with left axis, unchanged from previously, no ST or T-wave changes  Assessment/Plan: Please see individual problem list.  Essential hypertension Patient reports hypertensive urgency level blood pressures yesterday. Subsequently developed chest pain, though this has resolved. She's asymptomatic at this time. EKG is reassuring and unchanged from previously. Troponin negative. Blood pressures in both arms are equal making a vascular issue an unlikely cause. CMP with no renal or liver dysfunction. Neurologically intact. Start the patient on lisinopril. She will be referred to cardiology. She'll follow-up with her PCP this coming week. Return precautions given.    Orders Placed This Encounter  Procedures  . Comp Met (CMET)    Standing Status: Future     Number of Occurrences:      Standing Expiration Date: 03/14/2016  . Troponin I    Standing Status: Future     Number of Occurrences:      Standing Expiration Date: 03/14/2016  . Ambulatory referral to Cardiology    Referral Priority:  Routine    Referral  Type:  Consultation    Referral Reason:  Specialty Services Required    Requested Specialty:  Cardiology    Number of Visits Requested:  1  . EKG 12-Lead    Meds ordered this encounter  Medications  . lisinopril (PRINIVIL,ZESTRIL) 5 MG tablet    Sig: Take 1 tablet (5 mg total) by mouth daily.    Dispense:  90 tablet    Refill:  3    Dragon voice recognition  software was used during the dictation process of this note. If any phrases or words seem inappropriate it is likely secondary to the translation process being inefficient.  Tommi Rumps

## 2015-03-15 NOTE — Patient Instructions (Signed)
Nice to meet you. We'll start you on a new blood pressure medicine called lisinopril. Please go get blood work at [facility]. We will call you with the results of the blood work. If you develop any chest pain, shortness of breath, headaches, vision changes, numbness, weakness, increased swelling, or any new or change in symptoms please seek medical attention immediately.

## 2015-03-15 NOTE — Progress Notes (Signed)
Pre visit review using our clinic review tool, if applicable. No additional management support is needed unless otherwise documented below in the visit note. 

## 2015-03-15 NOTE — Assessment & Plan Note (Signed)
Patient reports hypertensive urgency level blood pressures yesterday. Subsequently developed chest pain, though this has resolved. She's asymptomatic at this time. EKG is reassuring and unchanged from previously. Troponin negative. Blood pressures in both arms are equal making a vascular issue an unlikely cause. CMP with no renal or liver dysfunction. Neurologically intact. Start the patient on lisinopril. She will be referred to cardiology. She'll follow-up with her PCP this coming week. Return precautions given.

## 2015-03-17 ENCOUNTER — Telehealth: Payer: Self-pay | Admitting: Endocrinology

## 2015-03-17 NOTE — Telephone Encounter (Signed)
There is a form, which I would be happy to do

## 2015-03-17 NOTE — Telephone Encounter (Signed)
Cathy from [facility] called regarding Brittany Acosta Rx for her diabetic shoes   She believes there is miscommunication   Please contact [facility] [place] @ [phone]   Thank you

## 2015-03-17 NOTE — Telephone Encounter (Signed)
Pt states she contacted her podiatrist and they will not rx her diabetic shoes, can we do this?  [facility]# [phone]

## 2015-03-17 NOTE — Telephone Encounter (Signed)
See note below and please advise, Thanks! 

## 2015-03-17 NOTE — Telephone Encounter (Signed)
F# [id number]

## 2015-03-17 NOTE — Telephone Encounter (Signed)
I contacted the pt and advised to have the form faxed to our office so we can complete.

## 2015-03-18 NOTE — Telephone Encounter (Signed)
I contacted Tye Maryland with [employer] and requested the DM shoe form to be faxed to your office. Waiting on the fax.

## 2015-03-19 ENCOUNTER — Telehealth: Payer: Self-pay | Admitting: Endocrinology

## 2015-03-19 NOTE — Telephone Encounter (Signed)
See note below and please advise, Thanks! 

## 2015-03-19 NOTE — Telephone Encounter (Signed)
Yes: Please call [phone], to schedule a free informational meeting

## 2015-03-19 NOTE — Telephone Encounter (Signed)
I contacted the pt and left a voicemail advising of note below.

## 2015-03-19 NOTE — Telephone Encounter (Signed)
Patient ask if you would recommend the bypass surgery Gastric bypass surgery, please advise

## 2015-03-21 ENCOUNTER — Telehealth: Payer: Self-pay | Admitting: Endocrinology

## 2015-03-21 NOTE — Telephone Encounter (Signed)
Pt advised we have received the paper work from [employer]. MD is working on completing the paper work. Pt verbalized understanding.

## 2015-03-21 NOTE — Telephone Encounter (Signed)
Patient ask if Dr Loanne Drilling has finished filling out her paper work for [employer], please advise

## 2015-03-21 NOTE — Telephone Encounter (Signed)
please call patient: In order to doe the form for diabetic shoes, I need 2 things Records from the office where charcot deformities were found, and: Another ov here, to document your examination

## 2015-03-24 NOTE — Telephone Encounter (Signed)
I contacted the pt and advised of note below. Pt scheduled for 03/27/2015.

## 2015-03-27 ENCOUNTER — Ambulatory Visit (INDEPENDENT_AMBULATORY_CARE_PROVIDER_SITE_OTHER): Payer: Medicare Other | Admitting: Endocrinology

## 2015-03-27 ENCOUNTER — Encounter: Payer: Self-pay | Admitting: Endocrinology

## 2015-03-27 VITALS — BP 126/72 | HR 91 | Temp 97.6°F | Ht 63.0 in | Wt 269.0 lb

## 2015-03-27 DIAGNOSIS — E1049 Type 1 diabetes mellitus with other diabetic neurological complication: Secondary | ICD-10-CM

## 2015-03-27 DIAGNOSIS — IMO0002 Reserved for concepts with insufficient information to code with codable children: Secondary | ICD-10-CM

## 2015-03-27 DIAGNOSIS — E1065 Type 1 diabetes mellitus with hyperglycemia: Secondary | ICD-10-CM

## 2015-03-27 NOTE — Patient Instructions (Signed)
In my opinion, you do qualify for the diabetic shoes.  I'll do the form.  i'll see you next time.

## 2015-03-27 NOTE — [facility] Notes (Signed)
Subjective:    Patient ID: Brittany Acosta, female    DOB: 07/30/1950, 64 y.o.   MRN: [id number]  HPI Pt reports 1 year of moderate numbness of the feet, and slight assoc pain Past Medical History  Diagnosis Date  . GOITER, MULTINODULAR   . HYPOTHYROIDISM   . HYPERCHOLESTEROLEMIA   . Morbid obesity ([place])   . BIPOLAR DISORDER UNSPECIFIED     depression  . Chronic diastolic heart failure ([place])   . ALLERGIC RHINITIS   . FIBROMYALGIA   . GERD (gastroesophageal reflux disease)   . History of wrist fracture     rt wrist  . Depression   . Type II diabetes mellitus ([place])   . Neuropathy due to secondary diabetes ([place])   . Iron deficiency anemia   . Kidney stones     sees urologist @ [place]  . History of gout     "haven't had it in several years" (02/08/2012)  . PTSD (post-traumatic stress disorder)   . Wears glasses   . Sleep apnea 7/14    mild osa-did not need cpap -dr Gwenette Greet  . PONV (postoperative nausea and vomiting)   . RBBB     Past Surgical History  Procedure Laterality Date  . Cholecystectomy  1985  . Abdominal hysterectomy  1976  . Tubal ligation  1972  . Right nasal surgery  08/1988  . Right sinus removed  08/1989    tooth partial  . Percitania stone removed (l) kidney  1992  . Lithotripsy (l) kidney  1997  . Left cystoscopy   1990  . Thyroidectomy  04/22/2011    Procedure: THYROIDECTOMY;  Surgeon: Onnie Graham, MD;  Location: [facility];  Service: ENT;  Laterality: N/A;  TOTAL THYROIDECOTMY  . Cataract extraction, bilateral  02/2011    epps  . Total knee arthroplasty  06/18/2011    Procedure: TOTAL KNEE ARTHROPLASTY;  Surgeon: Kerin Salen, MD;  Location: [facility];  Service: Orthopedics;  Laterality: Left;  DEPUY  . Total knee arthroplasty  02/07/2012    Procedure: TOTAL KNEE ARTHROPLASTY;  Surgeon: Kerin Salen, MD;  Location: [facility];  Service: Orthopedics;  Laterality: Right;  . Incisional breast biopsy  2000    right  . Cardiac catheterization  2001    sees Dr Peter  Martinique  . Knee arthroscopy  08/2009    right  . Knee arthroscopy with medial menisectomy      left  . Excisional hemorrhoidectomy      "dr cut out in his office" (02/08/2012)  . Left oophorectomy  1980  . Rotator cuff repair  2013    right shoulder x 2  . Joint replacement      rt knee  . Partial mastectomy with needle localization Left 09/01/2012    Procedure: PARTIAL MASTECTOMY WITH NEEDLE LOCALIZATION;  Surgeon: Adin Hector, MD;  Location: [facility];  Service: General;  Laterality: Left;  . Cataract extraction  2014  . Shoulder arthroscopy with rotator cuff repair and subacromial decompression Left 08/20/2013    Procedure: SHOULDER ARTHROSCOPY  AND SUBACROMIAL DECOMPRESSION;  Surgeon: Nita Sells, MD;  Location: [facility];  Service: Orthopedics;  Laterality: Left;  Left shoulder arthroscopy, debridement, subacromial decompression, distal clavical resection    Social History   Social History  . Marital Status: Married    Spouse Name: N/A  . Number of Children: 2  . Years of Education: 14   Occupational History  . disability  Social History Main Topics  . Smoking status: Former Smoker -- 1.00 packs/day for 6 years    Types: Cigarettes    Quit date: 04/06/1983  . Smokeless tobacco: Never Used     Comment: Married, lives with spouse. Disable- 2 grown kids-6 g-kids  . Alcohol Use: No     Comment: none  . Drug Use: No  . Sexual Activity: Not Currently   Other Topics Concern  . Not on file   Social History Narrative   Married, lives with spouse - 2 adult children   diabled    Current Outpatient Prescriptions on File Prior to Visit  Medication Sig Dispense Refill  . Biotin 10 MG CAPS Take 1 capsule by mouth daily.    . Brexpiprazole (REXULTI) 1 MG TABS Take by mouth.    . cephALEXin (KEFLEX) 500 MG capsule 500 mg. Take 4 prior to dental appt    . Coenzyme Q10 120 MG CAPS Take 120 mg by mouth daily.    Marland Kitchen doxepin (SINEQUAN) 10 MG capsule Take  10 mg by mouth at bedtime.     Marland Kitchen estradiol (ESTRACE) 0.5 MG tablet Take 1 tablet (0.5 mg total) by mouth daily. 90 tablet 1  . furosemide (LASIX) 40 MG tablet Take 1 tablet (40 mg total) by mouth daily. 90 tablet 3  . gabapentin (NEURONTIN) 300 MG capsule Take 2 capsules (600 mg total) by mouth 3 (three) times daily. 180 capsule 5  . hydrochlorothiazide 25 MG tablet Take 25 mg by mouth every morning.     . Insulin NPH, Human,, Isophane, (HUMULIN N KWIKPEN) 100 UNIT/ML Kiwkpen 55 units with breakfast, and 30 units with the evening meal. 105 mL 3  . Insulin Pen Needle (B-D UF III MINI PEN NEEDLES) 31G X 5 MM MISC Use pen needles with kwik to inject insulin into skin twice a day. 180 each 3  . lamoTRIgine (LAMICTAL) 200 MG tablet Take 200 mg by mouth daily at 12 noon.     . Levomilnacipran HCl ER (FETZIMA) 40 MG CP24 Take by mouth.    . levothyroxine (SYNTHROID) 50 MCG tablet Take 1 tablet (50 mcg total) by mouth daily. 90 tablet 3  . Melatonin 3 MG CAPS Take 1 capsule by mouth at bedtime.    . Multiple Vitamin (MULTIVITAMIN WITH MINERALS) TABS Take 1 tablet by mouth every evening.    Marland Kitchen oxybutynin (DITROPAN-XL) 10 MG 24 hr tablet Take 10 mg by mouth every evening.     . potassium citrate (UROCIT-K 10) 10 MEQ (1080 MG) SR tablet Take 20 mEq by mouth 2 (two) times daily.     . prazosin (MINIPRESS) 2 MG capsule Take 3 mg by mouth at bedtime.     No current facility-administered medications on file prior to visit.    No Known Allergies  Family History  Problem Relation Age of Onset  . Hypertension Mother   . Diabetes Father   . Hypertension Father   . Hyperlipidemia Father   . Heart attack Other   . Coronary artery disease Other     BP 126/72 mmHg  Pulse 91  Temp(Src) 97.6 F (36.4 C) (Oral)  Ht 5\' 3"  (1.6 m)  Wt 269 lb (122.018 kg)  BMI 47.66 kg/m2  SpO2 92%    Review of Systems She has slight swelling of the legs.    Objective:   Physical Exam VITAL SIGNS:  See vs  page GENERAL: no distress Pulses: dorsalis pedis intact bilat.   MSK: no  deformity of the feet CV: no leg edema Skin:  no ulcer on the feet.  normal color and temp on the feet. Neuro: sensation is intact to touch on the feet, but decreased from normal Ext: There is bilateral onychomycosis of the toenails.  There is ecchymosis under the right great toenail.   Radiol: foot MRI: mild deformities.    Assessment & Plan:  DM: she qualifies for shoes based on the foot deformities seen on MRI (but the deformities are not visible on physical exam).  Patient is advised the following: Patient Instructions  In my opinion, you do qualify for the diabetic shoes.  I'll do the form.  i'll see you next time.

## 2015-03-28 ENCOUNTER — Encounter: Payer: Self-pay | Admitting: Internal Medicine

## 2015-03-28 ENCOUNTER — Ambulatory Visit (INDEPENDENT_AMBULATORY_CARE_PROVIDER_SITE_OTHER): Payer: Medicare Other | Admitting: Internal Medicine

## 2015-03-28 VITALS — BP 138/82 | HR 79 | Temp 98.2°F | Resp 14 | Ht 63.5 in | Wt 269.0 lb

## 2015-03-28 DIAGNOSIS — F319 Bipolar disorder, unspecified: Secondary | ICD-10-CM

## 2015-03-28 DIAGNOSIS — IMO0002 Reserved for concepts with insufficient information to code with codable children: Secondary | ICD-10-CM

## 2015-03-28 DIAGNOSIS — E1065 Type 1 diabetes mellitus with hyperglycemia: Secondary | ICD-10-CM

## 2015-03-28 DIAGNOSIS — I10 Essential (primary) hypertension: Secondary | ICD-10-CM | POA: Diagnosis not present

## 2015-03-28 DIAGNOSIS — E1049 Type 1 diabetes mellitus with other diabetic neurological complication: Secondary | ICD-10-CM | POA: Diagnosis not present

## 2015-03-28 MED ORDER — LISINOPRIL 10 MG PO TABS: 10.0000 mg | ORAL_TABLET | Freq: Every day | ORAL | Status: DC

## 2015-03-28 NOTE — Progress Notes (Signed)
Pre visit review using our clinic review tool, if applicable. No additional management support is needed unless otherwise documented below in the visit note. 

## 2015-03-28 NOTE — [facility] Notes (Signed)
Subjective:    Patient ID: Brittany Acosta, female    DOB: 1951-02-12, 64 y.o.   MRN: [id number]  HPI  Here to f/u as PCP out of the office and pt is from out of town, for f/u recent concerns about elev BP. At [employer] 2 wks ago with SBP about 200 taken 4 times by the instructor that day; urged to go to [place] but she had declined to now, did have BP with provider last wk with SBP aprox 126/80. Has no other documented elev BPs.  Pt denies chest pain, increased sob or doe, wheezing, orthopnea, PND, increased LE swelling, palpitations, dizziness or syncope.  Pt denies new neurological symptoms such as new headache, or facial or extremity weakness or numbness   Pt denies polydipsia, polyuria, Pt states overall good compliance with meds.  No recent otc med use such as decongestants, wt gain. Denies being overly nervous or white coat HTn in past.  No increased recent ETOH or substance abuse. Denies worsening depressive symptoms, suicidal ideation, or panic; + hx of biplolar.  Currently on lisinopril 5 I supsect for renoprotective related to hx of DM. Past Medical History  Diagnosis Date  . GOITER, MULTINODULAR   . HYPOTHYROIDISM   . HYPERCHOLESTEROLEMIA   . Morbid obesity ([place])   . BIPOLAR DISORDER UNSPECIFIED     depression  . Chronic diastolic heart failure ([place])   . ALLERGIC RHINITIS   . FIBROMYALGIA   . GERD (gastroesophageal reflux disease)   . History of wrist fracture     rt wrist  . Depression   . Type II diabetes mellitus ([place])   . Neuropathy due to secondary diabetes ([place])   . Iron deficiency anemia   . Kidney stones     sees urologist @ [facility]  . History of gout     "haven't had it in several years" (02/08/2012)  . PTSD (post-traumatic stress disorder)   . Wears glasses   . Sleep apnea 7/14    mild osa-did not need cpap -dr Gwenette Greet  . PONV (postoperative nausea and vomiting)   . RBBB    Past Surgical History  Procedure Laterality Date  . Cholecystectomy  1985  . Abdominal  hysterectomy  1976  . Tubal ligation  1972  . Right nasal surgery  08/1988  . Right sinus removed  08/1989    tooth partial  . Percitania stone removed (l) kidney  1992  . Lithotripsy (l) kidney  1997  . Left cystoscopy   1990  . Thyroidectomy  04/22/2011    Procedure: THYROIDECTOMY;  Surgeon: Onnie Graham, MD;  Location: [facility];  Service: ENT;  Laterality: N/A;  TOTAL THYROIDECOTMY  . Cataract extraction, bilateral  02/2011    epps  . Total knee arthroplasty  06/18/2011    Procedure: TOTAL KNEE ARTHROPLASTY;  Surgeon: Kerin Salen, MD;  Location: [facility];  Service: Orthopedics;  Laterality: Left;  DEPUY  . Total knee arthroplasty  02/07/2012    Procedure: TOTAL KNEE ARTHROPLASTY;  Surgeon: Kerin Salen, MD;  Location: [facility];  Service: Orthopedics;  Laterality: Right;  . Incisional breast biopsy  2000    right  . Cardiac catheterization  2001    sees Dr Peter [place]  . Knee arthroscopy  08/2009    right  . Knee arthroscopy with medial menisectomy      left  . Excisional hemorrhoidectomy      "dr cut out in his office" (02/08/2012)  .  Left oophorectomy  1980  . Rotator cuff repair  2013    right shoulder x 2  . Joint replacement      rt knee  . Partial mastectomy with needle localization Left 09/01/2012    Procedure: PARTIAL MASTECTOMY WITH NEEDLE LOCALIZATION;  Surgeon: Adin Hector, MD;  Location: [facility];  Service: General;  Laterality: Left;  . Cataract extraction  2014  . Shoulder arthroscopy with rotator cuff repair and subacromial decompression Left 08/20/2013    Procedure: SHOULDER ARTHROSCOPY  AND SUBACROMIAL DECOMPRESSION;  Surgeon: Nita Sells, MD;  Location: [facility];  Service: Orthopedics;  Laterality: Left;  Left shoulder arthroscopy, debridement, subacromial decompression, distal clavical resection    reports that she quit smoking about 32 years ago. Her smoking use included Cigarettes. She has a 6 pack-year smoking history. She has never used  smokeless tobacco. She reports that she does not drink alcohol or use illicit drugs. family history includes Coronary artery disease in her other; Diabetes in her father; Heart attack in her other; Hyperlipidemia in her father; Hypertension in her father and mother. No Known Allergies Current Outpatient Prescriptions on File Prior to Visit  Medication Sig Dispense Refill  . Biotin 10 MG CAPS Take 1 capsule by mouth daily.    . Brexpiprazole (REXULTI) 1 MG TABS Take by mouth.    . cephALEXin (KEFLEX) 500 MG capsule 500 mg. Take 4 prior to dental appt    . Coenzyme Q10 120 MG CAPS Take 120 mg by mouth daily.    Marland Kitchen doxepin (SINEQUAN) 10 MG capsule Take 10 mg by mouth at bedtime.     Marland Kitchen estradiol (ESTRACE) 0.5 MG tablet Take 1 tablet (0.5 mg total) by mouth daily. 90 tablet 1  . furosemide (LASIX) 40 MG tablet Take 1 tablet (40 mg total) by mouth daily. 90 tablet 3  . gabapentin (NEURONTIN) 300 MG capsule Take 2 capsules (600 mg total) by mouth 3 (three) times daily. 180 capsule 5  . hydrochlorothiazide 25 MG tablet Take 25 mg by mouth every morning.     . Insulin NPH, Human,, Isophane, (HUMULIN N KWIKPEN) 100 UNIT/ML Kiwkpen 55 units with breakfast, and 30 units with the evening meal. 105 mL 3  . Insulin Pen Needle (B-D UF III MINI PEN NEEDLES) 31G X 5 MM MISC Use pen needles with kwik to inject insulin into skin twice a day. 180 each 3  . lamoTRIgine (LAMICTAL) 200 MG tablet Take 200 mg by mouth daily at 12 noon.     . Levomilnacipran HCl ER (FETZIMA) 40 MG CP24 Take by mouth.    . levothyroxine (SYNTHROID) 50 MCG tablet Take 1 tablet (50 mcg total) by mouth daily. 90 tablet 3  . Melatonin 3 MG CAPS Take 1 capsule by mouth at bedtime.    . Multiple Vitamin (MULTIVITAMIN WITH MINERALS) TABS Take 1 tablet by mouth every evening.    Marland Kitchen oxybutynin (DITROPAN-XL) 10 MG 24 hr tablet Take 10 mg by mouth every evening.     . potassium citrate (UROCIT-K 10) 10 MEQ (1080 MG) SR tablet Take 20 mEq by mouth 2  (two) times daily.     . prazosin (MINIPRESS) 2 MG capsule Take 3 mg by mouth at bedtime.     No current facility-administered medications on file prior to visit.    Review of Systems . Constitutional: Negative for unusual diaphoresis or night sweats HENT: Negative for ringing in ear or discharge Eyes: Negative for double vision or  worsening visual disturbance.  Respiratory: Negative for choking and stridor.   Gastrointestinal: Negative for vomiting or other signifcant bowel change Genitourinary: Negative for hematuria or change in urine volume.  Musculoskeletal: Negative for other MSK pain or swelling Skin: Negative for color change and worsening wound.  Neurological: Negative for tremors and numbness other than noted  Psychiatric/Behavioral: Negative for decreased concentration or agitation other than above       Objective:   Physical Exam BP 138/82 mmHg  Pulse 79  Temp(Src) 98.2 F (36.8 C) (Oral)  Resp 14  Ht 5' 3.5" (1.613 m)  Wt 269 lb (122.018 kg)  BMI 46.90 kg/m2  SpO2 98% VS noted, morbid obese Constitutional: Pt appears in no significant distress HENT: Head: NCAT.  Right Ear: External ear normal.  Left Ear: External ear normal.  Eyes: . Pupils are equal, round, and reactive to light. Conjunctivae and EOM are normal Neck: Normal range of motion. Neck supple.  Cardiovascular: Normal rate and regular rhythm.   Pulmonary/Chest: Effort normal and breath sounds without rales or wheezing.  Abd:  Soft, NT, ND, + BS, no flank tender Neurological: Pt is alert. Not confused , motor grossly intact Skin: Skin is warm. No rash, no LE edema Psychiatric: Pt behavior is normal. No agitation. not depressed affect, mild nervous only    Assessment & Plan:

## 2015-03-28 NOTE — Patient Instructions (Addendum)
Ok to increase the lsinopril to 10 mg per day  Please continue all other medications as before, and refills have been done if requested.  Please have the pharmacy call with any other refills you may need.  Please continue your efforts at being more active, low cholesterol diet, and weight control.  Please keep your appointments with your specialists as you may have planned  Please return in 3 months, or sooner if needed, to Dr Quay Burow

## 2015-03-29 NOTE — Assessment & Plan Note (Signed)
Lab Results  Component Value Date   HGBA1C 7.3 03/13/2015   Overall mild increased a1c, for cont same tx, f/u endo as planned

## 2015-03-29 NOTE — Assessment & Plan Note (Signed)
stable overall by history and exam, recent data reviewed with pt, and pt to continue medical treatment as before,  to f/u any worsening symptoms or concerns Lab Results  Component Value Date   WBC 12.2* 05/13/2014   HGB 13.8 05/13/2014   HCT 41.1 05/13/2014   PLT 424.0* 05/13/2014   GLUCOSE 245* 03/15/2015   CHOL 135 05/13/2014   TRIG 78.0 05/13/2014   HDL 65.40 05/13/2014   LDLDIRECT 56.4 06/11/2011   LDLCALC 54 05/13/2014   ALT 43 03/15/2015   AST 39 03/15/2015   NA 136 03/15/2015   K 3.5 03/15/2015   CL 94* 03/15/2015   CREATININE 0.97 03/15/2015   BUN 17 03/15/2015   CO2 35* 03/15/2015   TSH 2.29 05/13/2014   INR 1.01 02/04/2012   HGBA1C 7.3 03/13/2015   MICROALBUR 1.8 02/18/2014

## 2015-03-29 NOTE — Assessment & Plan Note (Signed)
Encouraged for increased activity, exercise, wt loss Wt Readings from Last 3 Encounters:  03/28/15 269 lb (122.018 kg)  03/27/15 269 lb (122.018 kg)  03/15/15 274 lb (124.286 kg)

## 2015-03-29 NOTE — Assessment & Plan Note (Signed)
I suspect degree of uncontrolled HTN to be mild at worst, though may have truly spiked recently due to dental visit stress in the setting of morbid obesity and psychiatric illness; at this time for purpose of "fine tuning" BP control (as HTN stress risk begins to increase after [id number] systolic), will elect to small increase the lisinopril to 10 mg per day, pt encouraged to follow her BP closely at home, with goal of being asympt with SBP lowered even to 110-120.  Higher dose ACE may lead to improved renoprotective for the long term as well.  Pt encouraged to be more active, wt loss, avoid otc meds such as nsaids and decongestants, and avoid excess ETOH.  To f/u PCP

## 2015-04-01 ENCOUNTER — Other Ambulatory Visit: Payer: Self-pay

## 2015-04-01 MED ORDER — MELOXICAM 15 MG PO TABS: 15.0000 mg | ORAL_TABLET | Freq: Every day | ORAL | Status: DC

## 2015-04-01 MED ORDER — FUROSEMIDE 40 MG PO TABS: 40.0000 mg | ORAL_TABLET | Freq: Every day | ORAL | Status: DC

## 2015-04-01 MED ORDER — CEPHALEXIN 500 MG PO CAPS: ORAL_CAPSULE | ORAL | Status: DC

## 2015-04-01 MED ORDER — HYDROCHLOROTHIAZIDE 25 MG PO TABS: 25.0000 mg | ORAL_TABLET | ORAL | Status: DC

## 2015-04-01 MED ORDER — LEVOTHYROXINE SODIUM 50 MCG PO TABS: 50.0000 ug | ORAL_TABLET | Freq: Every day | ORAL | Status: DC

## 2015-04-01 MED ORDER — GABAPENTIN 300 MG PO CAPS: 600.0000 mg | ORAL_CAPSULE | Freq: Three times a day (TID) | ORAL | Status: DC

## 2015-04-01 MED ORDER — ESTRADIOL 0.5 MG PO TABS: 0.5000 mg | ORAL_TABLET | Freq: Every day | ORAL | Status: DC

## 2015-04-01 MED ORDER — LAMOTRIGINE 200 MG PO TABS: 200.0000 mg | ORAL_TABLET | Freq: Every day | ORAL | Status: DC

## 2015-04-01 NOTE — Telephone Encounter (Signed)
Pt requesting refills of   Meloxicam 15 mg, daily  gabapentin (NEURONTIN) 300 MG capsule  Cephalexin 500 mg  She will not be establishing with you until February. Please advise

## 2015-04-01 NOTE — Telephone Encounter (Signed)
rx's sent to [facility]

## 2015-04-09 ENCOUNTER — Telehealth: Payer: Self-pay | Admitting: Endocrinology

## 2015-04-09 NOTE — Telephone Encounter (Signed)
Patient ask you to call her. °

## 2015-04-10 NOTE — Telephone Encounter (Signed)
I contacted the pt. She stated Brittany Acosta is requesting a actual signature and not a stamp on the diabetic shoe forms sent on 03/27/2015. Forms will be reviewed by MD. Pt advised.

## 2015-04-10 NOTE — Telephone Encounter (Signed)
Requested a call back from the pt.  

## 2015-04-16 ENCOUNTER — Telehealth: Payer: Self-pay

## 2015-04-16 NOTE — Telephone Encounter (Signed)
Pt called wanting to know why the Diabetic shoe forms were unable to be filled out and faxed to [employer]. Please advise, Thanks!

## 2015-04-16 NOTE — Telephone Encounter (Signed)
They have all the information they need. i would advise trying another vendor.

## 2015-04-17 ENCOUNTER — Encounter: Payer: Medicare Other | Admitting: Internal Medicine

## 2015-04-18 ENCOUNTER — Ambulatory Visit
Admission: RE | Admit: 2015-04-18 | Discharge: 2015-04-18 | Disposition: A | Discharge status: Home / Self Care | Payer: Medicare HMO | Source: Ambulatory Visit

## 2015-04-18 ENCOUNTER — Encounter: Payer: Self-pay | Admitting: Cardiology

## 2015-04-18 ENCOUNTER — Ambulatory Visit (INDEPENDENT_AMBULATORY_CARE_PROVIDER_SITE_OTHER): Payer: Medicare HMO | Admitting: Cardiology

## 2015-04-18 VITALS — BP 118/70 | HR 84 | Ht 63.0 in | Wt 271.8 lb

## 2015-04-18 DIAGNOSIS — Z8249 Family history of ischemic heart disease and other diseases of the circulatory system: Secondary | ICD-10-CM | POA: Insufficient documentation

## 2015-04-18 DIAGNOSIS — I4510 Unspecified right bundle-branch block: Secondary | ICD-10-CM | POA: Insufficient documentation

## 2015-04-18 DIAGNOSIS — I452 Bifascicular block: Secondary | ICD-10-CM

## 2015-04-18 DIAGNOSIS — I10 Essential (primary) hypertension: Secondary | ICD-10-CM

## 2015-04-18 DIAGNOSIS — Z1231 Encounter for screening mammogram for malignant neoplasm of breast: Secondary | ICD-10-CM

## 2015-04-18 DIAGNOSIS — R0789 Other chest pain: Secondary | ICD-10-CM

## 2015-04-18 NOTE — [facility] Notes (Signed)
Cardiology Office Note   Date:  04/18/2015   ID:  Brittany Acosta, DOB 1950-07-17, MRN [id number]  PCP:  [name], MD  Cardiologist:   Candee Furbish, MD       History of Present Illness: Brittany Acosta is a 65 y.o. female who presents for evaluation of chest pain at the request of Dr. Loanne Drilling.  Has a history of diabetes, sleep apnea, right bundle branch block, fibromyalgia, bipolar depression, obesity and hyperlipidemia.  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 Never been that high. Anxious. Cleaning was cancelled.  The pressure in her chest seemed to last for hours with no radiation, no diaphoresis, no shortness of breath. Finally it did resolve in the evening. She described no chest pain since then. She is a former smoker.  Lisinopril was started.  EKG 03/15/15 showed right bundle branch block, left anterior fascicular block. No ST segment changes  Mother had MI Father had 44 MI-died 27 - Uncle 75 died MI   07/13/99 Cath -OK 2012 - NUC OK  Has not felt any further symptoms of chest discomfort. Now has excellent control of her blood pressure. Last potassium 3.5. Watch with combination diuretic therapy.  Past Medical History  Diagnosis Date  . GOITER, MULTINODULAR   . HYPOTHYROIDISM   . HYPERCHOLESTEROLEMIA   . Morbid obesity ([place])   . BIPOLAR DISORDER UNSPECIFIED     depression  . Chronic diastolic heart failure ([place])   . ALLERGIC RHINITIS   . FIBROMYALGIA   . GERD (gastroesophageal reflux disease)   . History of wrist fracture     rt wrist  . Depression   . Type II diabetes mellitus ([place])   . Neuropathy due to secondary diabetes ([place])   . Iron deficiency anemia   . Kidney stones     sees urologist @ [facility]  . History of gout     "haven't had it in several years" (02/08/2012)  . PTSD (post-traumatic stress disorder)    . Wears glasses   . Sleep apnea 7/14    mild osa-did not need cpap -dr Gwenette Greet  . PONV (postoperative nausea and vomiting)   . RBBB     Past Surgical History  Procedure Laterality Date  . Cholecystectomy  1985  . Abdominal hysterectomy  1976  . Tubal ligation  07/13/1970  . Right nasal surgery  08/1988  . Right sinus removed  08/1989    tooth partial  . Percitania stone removed (l) kidney  1990-07-13  . Lithotripsy (l) kidney  07-13-95  . Left cystoscopy   1990  . Thyroidectomy  04/22/2011    Procedure: THYROIDECTOMY;  Surgeon: Onnie Graham, MD;  Location: [facility];  Service: ENT;  Laterality: N/A;  TOTAL THYROIDECOTMY  . Cataract extraction, bilateral  02/2011    epps  . Total knee arthroplasty  06/18/2011    Procedure: TOTAL KNEE ARTHROPLASTY;  Surgeon: Kerin Salen, MD;  Location: [facility];  Service: Orthopedics;  Laterality: Left;  DEPUY  . Total knee arthroplasty  02/07/2012    Procedure: TOTAL KNEE ARTHROPLASTY;  Surgeon: Kerin Salen, MD;  Location: [facility];  Service: Orthopedics;  Laterality: Right;  . Incisional breast biopsy  13-Jul-1998    right  . Cardiac catheterization  1999-07-13    sees Dr Peter Martinique  . Knee arthroscopy  08/2009    right  . Knee arthroscopy with medial menisectomy      left  . Excisional hemorrhoidectomy      "dr cut out in his office" (02/08/2012)  . Left oophorectomy  1980  . Rotator cuff repair  2013    right shoulder x 2  . Joint replacement      rt knee  . Partial mastectomy with needle localization Left 09/01/2012    Procedure: PARTIAL MASTECTOMY WITH NEEDLE LOCALIZATION;  Surgeon: Adin Hector, MD;  Location: [facility];  Service: General;  Laterality: Left;  . Cataract extraction  2014  . Shoulder arthroscopy with rotator cuff repair and subacromial decompression Left 08/20/2013    Procedure: SHOULDER ARTHROSCOPY  AND SUBACROMIAL DECOMPRESSION;  Surgeon: Nita Sells, MD;  Location: [facility];  Service: Orthopedics;  Laterality: Left;  Left  shoulder arthroscopy, debridement, subacromial decompression, distal clavical resection     Current Outpatient Prescriptions  Medication Sig Dispense Refill  . Biotin 10 MG CAPS Take 1 capsule by mouth daily.    . Brexpiprazole 2 MG TABS Take 2 mg by mouth daily.    . cephALEXin (KEFLEX) 500 MG capsule Take 4 prior to dental appt 4 capsule 0  . Coenzyme Q10 120 MG CAPS Take 120 mg by mouth daily.    Marland Kitchen doxepin (SINEQUAN) 100 MG capsule Take 100 mg by mouth at bedtime.    Marland Kitchen estradiol (ESTRACE) 0.5 MG tablet Take 1 tablet (0.5 mg total) by mouth daily. 90 tablet 1  . furosemide (LASIX) 40 MG tablet Take 1 tablet (40 mg total) by mouth daily. 90 tablet 3  . gabapentin (NEURONTIN) 300 MG capsule Take 2 capsules (600 mg total) by mouth 3 (three) times daily. 180 capsule 0  . hydrochlorothiazide (HYDRODIURIL) 25 MG tablet Take 1 tablet (25 mg total) by mouth every morning. 60 tablet 0  . Insulin NPH, Human,, Isophane, (HUMULIN N KWIKPEN) 100 UNIT/ML Kiwkpen 55 units with breakfast, and 30 units with the evening meal. 105 mL 3  . Insulin Pen Needle (B-D UF III MINI PEN NEEDLES) 31G X 5 MM MISC Use pen needles with kwik to inject insulin into skin twice a day. 180 each 3  . lamoTRIgine (LAMICTAL) 200 MG tablet Take 1 tablet (200 mg total) by mouth daily at 12 noon. 60 tablet 0  . Levomilnacipran HCl ER (FETZIMA) 40 MG CP24 Take 40 mg by mouth daily.     Marland Kitchen levothyroxine (SYNTHROID) 50 MCG tablet Take 1 tablet (50 mcg total) by mouth daily. 90 tablet 0  . lisinopril (PRINIVIL,ZESTRIL) 10 MG tablet Take 1 tablet (10 mg total) by mouth daily. 90 tablet 3  . Melatonin 3 MG CAPS Take 1 capsule by mouth at bedtime.    . meloxicam (MOBIC) 15 MG tablet Take 1 tablet (15 mg total) by mouth daily. 90 tablet 0  . Multiple Vitamin (MULTIVITAMIN WITH MINERALS) TABS Take 1 tablet by mouth every evening.    Marland Kitchen oxybutynin (DITROPAN-XL) 10 MG 24 hr tablet Take 10 mg by mouth every evening.     . potassium citrate  (UROCIT-K 10) 10 MEQ (1080 MG) SR tablet Take 20 mEq by mouth 2 (two) times daily.     Marland Kitchen PRAZOSIN HCL PO Take 3 mg by mouth at bedtime.     No current facility-administered medications for this visit.    Allergies:   Review of patient's allergies indicates no known allergies.    Social History:  The patient  reports that she quit smoking about 32 years ago. Her smoking use included Cigarettes. She has a 6 pack-year smoking history. She has never used smokeless tobacco. She reports that she does not drink alcohol or use illicit drugs.   Family History:  The patient's family history includes Coronary artery disease in her other; Diabetes in her father; Heart attack in her other; Hyperlipidemia in her father; Hypertension in her father and mother.    ROS:  Please see the history of present illness.   Otherwise, review of systems are positive for none.   All other systems are reviewed and negative.    PHYSICAL EXAM: VS:  BP 118/70 mmHg  Pulse 84  Ht 5\' 3"  (1.6 m)  Wt 271 lb 12.8 oz (123.288 kg)  BMI 48.16 kg/m2 , BMI Body mass index is 48.16 kg/(m^2). GEN: Well nourished, well developed, in no acute distress HEENT: normal Neck: no JVD, carotid bruits, or masses Cardiac: RRR; no murmurs, rubs, or gallops,no edema  Respiratory:  clear to auscultation bilaterally, normal work of breathing GI: soft, nontender, nondistended, + BS, obese MS: no deformity or atrophy Skin: warm and dry, no rash Neuro:  Strength and sensation are intact Psych: euthymic mood, full affect   EKG:    EKG 03/15/15 showed right bundle branch block, left anterior fascicular block. No ST segment changes   Recent Labs: 05/13/2014: Hemoglobin 13.8; Platelets 424.0*; TSH 2.29 03/15/2015: ALT 43; BUN 17; Creatinine, Ser 0.97; Potassium 3.5; Sodium 136    Lipid Panel    Component Value Date/Time   CHOL 135 05/13/2014 0850   TRIG 78.0 05/13/2014 0850   HDL 65.40 05/13/2014 0850   CHOLHDL 2 05/13/2014 0850    VLDL 15.6 05/13/2014 0850   LDLCALC 54 05/13/2014 0850   LDLDIRECT 56.4 06/11/2011 0931      Wt Readings from Last 3 Encounters:  04/18/15 271 lb 12.8 oz (123.288 kg)  03/28/15 269 lb (122.018 kg)  03/27/15 269 lb (122.018 kg)      Other studies Reviewed: Additional studies/ records that were reviewed today include: Office notes, EKG, prior echocardiogram, prior cardiac catheterization reviewed. Review of the above records demonstrates: As above   ASSESSMENT AND PLAN:  Chest pain  - Fairly atypical, right-sided associated with hypertension, anxiety  - No further symptoms after getting blood pressure under control  - We gave her option of proceeding with stress test, she has had stress test in the past, 2012, low risk with no ischemia, prior cardiac catheterization on 12/15/1999, normal. This point since she is not having any further discomfort, she would like to wait. This is very reasonable. If she has further chest discomfort, she will let us know. We will set up stress test at this time.  Family history of CAD/MI  - Continue with aggressive risk factor prevention  Diabetes  - Even though LDL is 54 I would strongly consider statin therapy per guidelines.  Morbid obesity  - encourage weight loss  - Discussed low carbohydrate foods  - She is not currently interested in bariatric surgery  Essential hypertension  - Currently well controlled with addition of lisinopril.  - Watch potassium and sodium closely with both furosemide and HCTZ.  Bifascicular block  - Both right bundle block and left anterior fascicular block  - No high-risk symptoms such as syncope.  - In the distant future, may require pacemaker if electrical system deteriorates.   Current medicines are reviewed at length with the patient today.  The patient does  not have concerns regarding medicines.  The following changes have been made:  no change  Labs/ tests ordered today include: none  No orders of the  defined types were placed in this encounter.     Disposition:   [place] PRN chest pain  Signed, Candee Furbish, MD  04/18/2015 9:27 AM    [facility] [address], [place], [place]  [address] Phone: [phone]; Fax: [phone]

## 2015-04-18 NOTE — Telephone Encounter (Signed)
I contacted the pt and advised of note below. Pt stated she will try to find another vendor and let us know.

## 2015-04-18 NOTE — Patient Instructions (Signed)
Medication Instructions:  The current medical regimen is effective;  continue present plan and medications.  Follow-Up: Follow up as needed.  If you need a refill on your cardiac medications before your next appointment, please call your pharmacy.  Thank you for choosing [facility]!!     

## 2015-04-21 ENCOUNTER — Ambulatory Visit: Payer: Medicare Other | Admitting: Endocrinology

## 2015-04-28 ENCOUNTER — Telehealth: Payer: Self-pay | Admitting: Internal Medicine

## 2015-04-28 NOTE — Telephone Encounter (Signed)
Pt request to speak to the assistant concern about the orthotics. Please give her a call back

## 2015-04-28 NOTE — Telephone Encounter (Signed)
Voicemail not set up to leave message.  

## 2015-04-29 NOTE — Telephone Encounter (Signed)
Spoke with patient and recommended trying to see Dr. Andreas Blower as his orthotics have more cushioning for her current issues

## 2015-05-07 ENCOUNTER — Telehealth: Payer: Self-pay | Admitting: Internal Medicine

## 2015-05-07 NOTE — Telephone Encounter (Signed)
Pt is requesting refill for gabapentin (NEURONTIN) 300 MG capsule [id number]  Pharmacy is [facility] in [place]. She thinks its on [address]

## 2015-05-07 NOTE — Telephone Encounter (Signed)
Please advise. Pt has an appt with [place] on 06/09/15

## 2015-05-08 MED ORDER — GABAPENTIN 300 MG PO CAPS: 600.0000 mg | ORAL_CAPSULE | Freq: Three times a day (TID) | ORAL | Status: DC

## 2015-05-08 NOTE — Telephone Encounter (Signed)
RX has been sent to [facility]

## 2015-05-08 NOTE — Telephone Encounter (Signed)
Ok to refill 

## 2015-05-08 NOTE — Telephone Encounter (Signed)
Can you please send this to [facility] in [place] on [address]

## 2015-05-16 ENCOUNTER — Encounter: Payer: Medicare Other | Admitting: Internal Medicine

## 2015-05-20 ENCOUNTER — Encounter: Payer: Medicare HMO | Admitting: Sports Medicine

## 2015-06-04 ENCOUNTER — Telehealth: Payer: Self-pay | Admitting: Endocrinology

## 2015-06-04 NOTE — Telephone Encounter (Signed)
Attempted to reach the pt. Will try again at a later time.  

## 2015-06-04 NOTE — Telephone Encounter (Signed)
Patient declined leaving a detailed message   Please call her back   Thank you

## 2015-06-04 NOTE — Telephone Encounter (Signed)
I contacted the pt. Pt stated she has moved to [place] and wanted the name of medical supplies she could contact the get her diabetic shoes. Pt will contact the suppliers and will let our office know what they say.

## 2015-06-05 ENCOUNTER — Telehealth: Payer: Self-pay | Admitting: Endocrinology

## 2015-06-05 NOTE — Telephone Encounter (Signed)
PT wanted to know if you received her message, she didn't go into detail as to what specific message.

## 2015-06-05 NOTE — Telephone Encounter (Signed)
I contacted the pt and advised I have spoken to the orthotic company about her diabetic shoes. Pt was advised once receive the form the MD would review and we would contact her back. Pt voiced understanding.

## 2015-06-09 ENCOUNTER — Encounter: Payer: Self-pay | Admitting: Endocrinology

## 2015-06-09 ENCOUNTER — Encounter: Payer: Self-pay | Admitting: Internal Medicine

## 2015-06-09 ENCOUNTER — Ambulatory Visit (INDEPENDENT_AMBULATORY_CARE_PROVIDER_SITE_OTHER): Payer: Medicare HMO | Admitting: Endocrinology

## 2015-06-09 ENCOUNTER — Ambulatory Visit (INDEPENDENT_AMBULATORY_CARE_PROVIDER_SITE_OTHER): Payer: Medicare HMO | Admitting: Internal Medicine

## 2015-06-09 VITALS — BP 136/84 | HR 83 | Temp 97.6°F | Ht 63.0 in | Wt 275.0 lb

## 2015-06-09 VITALS — BP 120/52 | HR 81 | Temp 97.7°F | Resp 16 | Wt 277.0 lb

## 2015-06-09 DIAGNOSIS — IMO0002 Reserved for concepts with insufficient information to code with codable children: Secondary | ICD-10-CM

## 2015-06-09 DIAGNOSIS — E1065 Type 1 diabetes mellitus with hyperglycemia: Secondary | ICD-10-CM

## 2015-06-09 DIAGNOSIS — Z794 Long term (current) use of insulin: Secondary | ICD-10-CM

## 2015-06-09 DIAGNOSIS — G6289 Other specified polyneuropathies: Secondary | ICD-10-CM | POA: Diagnosis not present

## 2015-06-09 DIAGNOSIS — E039 Hypothyroidism, unspecified: Secondary | ICD-10-CM | POA: Diagnosis not present

## 2015-06-09 DIAGNOSIS — E1142 Type 2 diabetes mellitus with diabetic polyneuropathy: Secondary | ICD-10-CM | POA: Diagnosis not present

## 2015-06-09 DIAGNOSIS — E1049 Type 1 diabetes mellitus with other diabetic neurological complication: Secondary | ICD-10-CM

## 2015-06-09 DIAGNOSIS — Z0000 Encounter for general adult medical examination without abnormal findings: Secondary | ICD-10-CM | POA: Diagnosis not present

## 2015-06-09 DIAGNOSIS — K219 Gastro-esophageal reflux disease without esophagitis: Secondary | ICD-10-CM | POA: Diagnosis not present

## 2015-06-09 DIAGNOSIS — I10 Essential (primary) hypertension: Secondary | ICD-10-CM

## 2015-06-09 DIAGNOSIS — Z780 Asymptomatic menopausal state: Secondary | ICD-10-CM

## 2015-06-09 DIAGNOSIS — F319 Bipolar disorder, unspecified: Secondary | ICD-10-CM

## 2015-06-09 LAB — POCT GLYCOSYLATED HEMOGLOBIN (HGB A1C): Hemoglobin A1C: 7.1

## 2015-06-09 MED ORDER — INSULIN ISOPHANE HUMAN 100 UNIT/ML KWIKPEN: PEN_INJECTOR | SUBCUTANEOUS | Status: DC

## 2015-06-09 MED ORDER — ESTRADIOL 0.5 MG PO TABS: 0.5000 mg | ORAL_TABLET | Freq: Every day | ORAL | Status: DC

## 2015-06-09 MED ORDER — LAMOTRIGINE 200 MG PO TABS: 200.0000 mg | ORAL_TABLET | Freq: Every day | ORAL | Status: DC

## 2015-06-09 MED ORDER — POTASSIUM CITRATE ER 10 MEQ (1080 MG) PO TBCR: 20.0000 meq | EXTENDED_RELEASE_TABLET | Freq: Two times a day (BID) | ORAL | Status: DC

## 2015-06-09 MED ORDER — BREXPIPRAZOLE 2 MG PO TABS: 2.0000 mg | ORAL_TABLET | Freq: Every day | ORAL | Status: DC

## 2015-06-09 MED ORDER — DOXEPIN HCL 100 MG PO CAPS: 100.0000 mg | ORAL_CAPSULE | Freq: Every day | ORAL | Status: DC

## 2015-06-09 MED ORDER — OXYBUTYNIN CHLORIDE ER 10 MG PO TB24: 10.0000 mg | ORAL_TABLET | Freq: Every evening | ORAL | Status: DC

## 2015-06-09 MED ORDER — LEVOMILNACIPRAN HCL ER 40 MG PO CP24: 40.0000 mg | ORAL_CAPSULE | Freq: Every day | ORAL | Status: DC

## 2015-06-09 MED ORDER — PRAZOSIN HCL 1 MG PO CAPS: 3.0000 mg | ORAL_CAPSULE | Freq: Every day | ORAL | Status: DC

## 2015-06-09 MED ORDER — LEVOTHYROXINE SODIUM 50 MCG PO TABS: 50.0000 ug | ORAL_TABLET | Freq: Every day | ORAL | Status: DC

## 2015-06-09 MED ORDER — GABAPENTIN 300 MG PO CAPS: 600.0000 mg | ORAL_CAPSULE | Freq: Three times a day (TID) | ORAL | Status: DC

## 2015-06-09 MED ORDER — HYDROCHLOROTHIAZIDE 25 MG PO TABS: 25.0000 mg | ORAL_TABLET | ORAL | Status: DC

## 2015-06-09 MED ORDER — RANITIDINE HCL 150 MG PO TABS: 150.0000 mg | ORAL_TABLET | Freq: Two times a day (BID) | ORAL | Status: DC

## 2015-06-09 MED ORDER — LISINOPRIL 10 MG PO TABS: 10.0000 mg | ORAL_TABLET | Freq: Every day | ORAL | Status: DC

## 2015-06-09 MED ORDER — FUROSEMIDE 40 MG PO TABS: 40.0000 mg | ORAL_TABLET | Freq: Every day | ORAL | Status: DC

## 2015-06-09 MED ORDER — MELOXICAM 15 MG PO TABS: 15.0000 mg | ORAL_TABLET | Freq: Every day | ORAL | Status: DC

## 2015-06-09 NOTE — Patient Instructions (Addendum)
Call your insurance company and ask if cologuard is covered (screening test for colon cancer) - if it is covered let us know and we will arrange for you to do it.    Have the blood work done when you are able - you do need to fast.    Your prescriptions have been sent to your pharmacy.     Brittany Acosta , Thank you for taking time to come for your Medicare Wellness Visit. I appreciate your ongoing commitment to your health goals. Please review the following plan we discussed and let me know if I can assist you in the future.   These are the goals we discussed: Goals    None      This is a list of the screening recommended for you and due dates:  Health Maintenance  Topic Date Due  .  Hepatitis C: One time screening is recommended by Center for Disease Control  (CDC) for  adults born from [birth year] through 1965.   [birth year]  . Colon Cancer Screening  09/04/2014  . Pap Smear  05/06/2018*  . Complete foot exam   08/19/2015  . Eye exam for diabetics  09/04/2015  . Hemoglobin A1C  09/11/2015  . Flu Shot  11/04/2015  . Mammogram  04/17/2017  . Tetanus Vaccine  05/01/2023  . Shingles Vaccine  Completed  . HIV Screening  Completed  *Topic was postponed. The date shown is not the original due date.   Health Maintenance, Female Adopting a healthy lifestyle and getting preventive care can go a long way to promote health and wellness. Talk with your health care provider about what schedule of regular examinations is right for you. This is a good chance for you to check in with your provider about disease prevention and staying healthy. In between checkups, there are plenty of things you can do on your own. Experts have done a lot of research about which lifestyle changes and preventive measures are most likely to keep you healthy. Ask your health care provider for more information. WEIGHT AND DIET  Eat a healthy diet  Be sure to include plenty of vegetables, fruits, low-fat dairy products, and lean  protein.  Do not eat a lot of foods high in solid fats, added sugars, or salt.  Get regular exercise. This is one of the most important things you can do for your health.  Most adults should exercise for at least 150 minutes each week. The exercise should increase your heart rate and make you sweat (moderate-intensity exercise).  Most adults should also do strengthening exercises at least twice a week. This is in addition to the moderate-intensity exercise.  Maintain a healthy weight  Body mass index (BMI) is a measurement that can be used to identify possible weight problems. It estimates body fat based on height and weight. Your health care provider can help determine your BMI and help you achieve or maintain a healthy weight.  For females 17 years of age and older:   A BMI below 18.5 is considered underweight.  A BMI of 18.5 to 24.9 is normal.  A BMI of 25 to 29.9 is considered overweight.  A BMI of 30 and above is considered obese.  Watch levels of cholesterol and blood lipids  You should start having your blood tested for lipids and cholesterol at 65 years of age, then have this test every 5 years.  You may need to have your cholesterol levels checked more often if:  Your lipid  or cholesterol levels are high.  You are older than 65 years of age.  You are at high risk for heart disease.  CANCER SCREENING   Lung Cancer  Lung cancer screening is recommended for adults 38-53 years old who are at high risk for lung cancer because of a history of smoking.  A yearly low-dose CT scan of the lungs is recommended for people who:  Currently smoke.  Have quit within the past 15 years.  Have at least a 30-pack-year history of smoking. A pack year is smoking an average of one pack of cigarettes a day for 1 year.  Yearly screening should continue until it has been 15 years since you quit.  Yearly screening should stop if you develop a health problem that would prevent you  from having lung cancer treatment.  Breast Cancer  Practice breast self-awareness. This means understanding how your breasts normally appear and feel.  It also means doing regular breast self-exams. Let your health care provider know about any changes, no matter how small.  If you are in your 20s or 30s, you should have a clinical breast exam (CBE) by a health care provider every 1-3 years as part of a regular health exam.  If you are 62 or older, have a CBE every year. Also consider having a breast X-ray (mammogram) every year.  If you have a family history of breast cancer, talk to your health care provider about genetic screening.  If you are at high risk for breast cancer, talk to your health care provider about having an MRI and a mammogram every year.  Breast cancer gene (BRCA) assessment is recommended for women who have family members with BRCA-related cancers. BRCA-related cancers include:  Breast.  Ovarian.  Tubal.  Peritoneal cancers.  Results of the assessment will determine the need for genetic counseling and BRCA1 and BRCA2 testing. Cervical Cancer Your health care provider may recommend that you be screened regularly for cancer of the pelvic organs (ovaries, uterus, and vagina). This screening involves a pelvic examination, including checking for microscopic changes to the surface of your cervix (Pap test). You may be encouraged to have this screening done every 3 years, beginning at age 48.  For women ages 67-65, health care providers may recommend pelvic exams and Pap testing every 3 years, or they may recommend the Pap and pelvic exam, combined with testing for human papilloma virus (HPV), every 5 years. Some types of HPV increase your risk of cervical cancer. Testing for HPV may also be done on women of any age with unclear Pap test results.  Other health care providers may not recommend any screening for nonpregnant women who are considered low risk for pelvic  cancer and who do not have symptoms. Ask your health care provider if a screening pelvic exam is right for you.  If you have had past treatment for cervical cancer or a condition that could lead to cancer, you need Pap tests and screening for cancer for at least 20 years after your treatment. If Pap tests have been discontinued, your risk factors (such as having a new sexual partner) need to be reassessed to determine if screening should resume. Some women have medical problems that increase the chance of getting cervical cancer. In these cases, your health care provider may recommend more frequent screening and Pap tests. Colorectal Cancer  This type of cancer can be detected and often prevented.  Routine colorectal cancer screening usually begins at 65 years of age  and continues through 65 years of age.  Your health care provider may recommend screening at an earlier age if you have risk factors for colon cancer.  Your health care provider may also recommend using home test kits to check for hidden blood in the stool.  A small camera at the end of a tube can be used to examine your colon directly (sigmoidoscopy or colonoscopy). This is done to check for the earliest forms of colorectal cancer.  Routine screening usually begins at age 30.  Direct examination of the colon should be repeated every 5-10 years through 65 years of age. However, you may need to be screened more often if early forms of precancerous polyps or small growths are found. Skin Cancer  Check your skin from head to toe regularly.  Tell your health care provider about any new moles or changes in moles, especially if there is a change in a mole's shape or color.  Also tell your health care provider if you have a mole that is larger than the size of a pencil eraser.  Always use sunscreen. Apply sunscreen liberally and repeatedly throughout the day.  Protect yourself by wearing long sleeves, pants, a wide-brimmed hat, and  sunglasses whenever you are outside. HEART DISEASE, DIABETES, AND HIGH BLOOD PRESSURE   High blood pressure causes heart disease and increases the risk of stroke. High blood pressure is more likely to develop in:  People who have blood pressure in the high end of the normal range (130-139/85-89 mm Hg).  People who are overweight or obese.  People who are African American.  If you are 74-95 years of age, have your blood pressure checked every 3-5 years. If you are 38 years of age or older, have your blood pressure checked every year. You should have your blood pressure measured twice--once when you are at a hospital or clinic, and once when you are not at a hospital or clinic. Record the average of the two measurements. To check your blood pressure when you are not at a hospital or clinic, you can use:  An automated blood pressure machine at a pharmacy.  A home blood pressure monitor.  If you are between 63 years and 65 years old, ask your health care provider if you should take aspirin to prevent strokes.  Have regular diabetes screenings. This involves taking a blood sample to check your fasting blood sugar level.  If you are at a normal weight and have a low risk for diabetes, have this test once every three years after 65 years of age.  If you are overweight and have a high risk for diabetes, consider being tested at a younger age or more often. PREVENTING INFECTION  Hepatitis B  If you have a higher risk for hepatitis B, you should be screened for this virus. You are considered at high risk for hepatitis B if:  You were born in a country where hepatitis B is common. Ask your health care provider which countries are considered high risk.  Your parents were born in a high-risk country, and you have not been immunized against hepatitis B (hepatitis B vaccine).  You have HIV or AIDS.  You use needles to inject street drugs.  You live with someone who has hepatitis B.  You have  had sex with someone who has hepatitis B.  You get hemodialysis treatment.  You take certain medicines for conditions, including cancer, organ transplantation, and autoimmune conditions. Hepatitis C  Blood testing is recommended  for:  Everyone born from [birth year] through 1965.  Anyone with known risk factors for hepatitis C. Sexually transmitted infections (STIs)  You should be screened for sexually transmitted infections (STIs) including gonorrhea and chlamydia if:  You are sexually active and are younger than 65 years of age.  You are older than 65 years of age and your health care provider tells you that you are at risk for this type of infection.  Your sexual activity has changed since you were last screened and you are at an increased risk for chlamydia or gonorrhea. Ask your health care provider if you are at risk.  If you do not have HIV, but are at risk, it may be recommended that you take a prescription medicine daily to prevent HIV infection. This is called pre-exposure prophylaxis (PrEP). You are considered at risk if:  You are sexually active and do not regularly use condoms or know the HIV status of your partner(s).  You take drugs by injection.  You are sexually active with a partner who has HIV. Talk with your health care provider about whether you are at high risk of being infected with HIV. If you choose to begin PrEP, you should first be tested for HIV. You should then be tested every 3 months for as long as you are taking PrEP.  PREGNANCY   If you are premenopausal and you may become pregnant, ask your health care provider about preconception counseling.  If you may become pregnant, take 400 to 800 micrograms (mcg) of folic acid every day.  If you want to prevent pregnancy, talk to your health care provider about birth control (contraception). OSTEOPOROSIS AND MENOPAUSE   Osteoporosis is a disease in which the bones lose minerals and strength with aging. This can  result in serious bone fractures. Your risk for osteoporosis can be identified using a bone density scan.  If you are 65 years of age or older, or if you are at risk for osteoporosis and fractures, ask your health care provider if you should be screened.  Ask your health care provider whether you should take a calcium or vitamin D supplement to lower your risk for osteoporosis.  Menopause may have certain physical symptoms and risks.  Hormone replacement therapy may reduce some of these symptoms and risks. Talk to your health care provider about whether hormone replacement therapy is right for you.  HOME CARE INSTRUCTIONS   Schedule regular health, dental, and eye exams.  Stay current with your immunizations.   Do not use any tobacco products including cigarettes, chewing tobacco, or electronic cigarettes.  If you are pregnant, do not drink alcohol.  If you are breastfeeding, limit how much and how often you drink alcohol.  Limit alcohol intake to no more than 1 drink per day for nonpregnant women. One drink equals 12 ounces of beer, 5 ounces of wine, or 1 ounces of hard liquor.  Do not use street drugs.  Do not share needles.  Ask your health care provider for help if you need support or information about quitting drugs.  Tell your health care provider if you often feel depressed.  Tell your health care provider if you have ever been abused or do not feel safe at home.   This information is not intended to replace advice given to you by your health care provider. Make sure you discuss any questions you have with your health care provider.   Document Released: 10/05/2010 Document Revised: 04/12/2014 Document Reviewed: 02/21/2013 [employer]vier  Interactive Patient Education [employer].

## 2015-06-09 NOTE — Progress Notes (Signed)
Pre visit review using our clinic review tool, if applicable. No additional management support is needed unless otherwise documented below in the visit note. 

## 2015-06-09 NOTE — Assessment & Plan Note (Signed)
Taking estradiol daily Does not follow with GYN Advised her that in the next several years we should decrease her dose and eventually get her off of the medication because of the risks outweigh the benefits

## 2015-06-09 NOTE — Progress Notes (Signed)
Subjective:    Patient ID: Brittany Acosta, female    DOB: [birth year], 65 y.o.   MRN: [id number]  HPI She is here to establish with a new pcp.    Here for medicare wellness/physical exam.   I have personally reviewed and have noted 1.The patient's medical and social history 2.Their use of alcohol, tobacco or illicit drugs 3.Their current medications and supplements 4.The patient's functional ability including ADL's, fall risks, home safety risks and                 hearing or visual impairment. 5.Diet and physical activities 6.Evidence for depression or mood disorders 7.Care team reviewed   Her sugars at home have been in the 140's.  She gets hypoglycemia a couple of times a week.  She sees Dr. Loanne Drilling for her diabetes later today.    Are there smokers in your home (other than you)? No  Risk Factors Exercise: none Dietary issues discussed: eats low sugar/provider diet  Cardiac risk factors: diabetes, hypertension, hyperlipidemia, and obesity (BMI >= 30 kg/m2).  Depression Screen  Have you felt down, depressed or hopeless? Yes, no thoughts of suicide.  Follow up psychiatry this month  Have you felt little interest or pleasure in doing things?  Yes   Activities of Daily Living In your present state of health, do you have any difficulty performing the following activities?:  Driving? Yes - does not drive due to neuropathy Managing money?  no  Feeding yourself? no Getting from bed to chair? No  Climbing a flight of stairs? Yes - due to neuropathy Preparing food and eating?: no Bathing or showering? no Getting dressed: no Getting to/using the toilet? no Moving around from place to place: no In the past year have you fallen or had a near fall?: Yes  - related to neuropathy   Are you sexually active?  No  Do you have more than one partner?  N/A  Hearing Difficulties: No Do you often ask people to speak up or  repeat themselves? No Do you experience ringing or noises in your ears? No Do you have difficulty understanding soft or whispered voices? No Vision:              Any change in vision:  no             Up to date with eye exam: yes, has appt later this month  Memory:  Do you feel that you have a problem with memory? Yes   Do you often misplace items? No  Do you feel safe at home?  Yes  Cognitive Testing  Alert, Orientated? Yes  Normal Appearance? Yes  Recall of three objects?  Yes  Can perform simple calculations? Yes  Displays appropriate judgment? Yes  Can read the correct time from a watch face? Yes   Advanced Directives have been discussed with the patient? Yes  Medications and allergies reviewed with patient and updated if appropriate.  Patient Active Problem List   Diagnosis Date Noted  . Family history of early CAD 04/18/2015  . Right bundle branch block 04/18/2015  . Bifascicular block 04/18/2015  . Tailbone injury 11/05/2014  . Lumbar radiculopathy 05/31/2014  . Medicare annual wellness visit, subsequent 05/13/2014  . Positive depression screening 05/13/2014  . Greater trochanteric bursitis of left hip 04/22/2014  . Achilles tendinosis 02/19/2014  . Gait disorder 11/14/2013  . Recurrent falls 11/14/2013  . Balance problem 11/14/2013  . Multiple fractures 11/14/2013  . Hematoma  of armpit 11/14/2013  . UTI symptoms 11/02/2013  . Pain in joint, ankle and foot 07/02/2013  . Peripheral neuropathy ([place]) 09/13/2012  . Abnormal mammogram 07/28/2012  . Migraine 07/10/2012  . Osteoarthritis of right knee 02/10/2012  . Type 1 diabetes mellitus with neurological manifestations, uncontrolled ([place]) 08/18/2011  . Osteoarthritis of left knee 06/21/2011  . GERD (gastroesophageal reflux disease) 02/09/2011  . Atypical chest pain 02/09/2011  . Unspecified vitamin D deficiency 08/27/2010  . FIBROMYALGIA 03/23/2010  . APHTHOUS STOMATITIS 05/23/2009  . GOITER, MULTINODULAR  03/24/2009  . Edema 09/06/2008  . Morbid obesity ([place]) 09/03/2008  . Hypothyroidism 06/10/2008  . HYPERCHOLESTEROLEMIA 06/10/2008  . BIPOLAR DISORDER UNSPECIFIED 06/10/2008  . Essential hypertension 06/10/2008  . ALLERGIC RHINITIS 06/10/2008  . ASYMPTOMATIC POSTMENOPAUSAL STATUS 06/10/2008    Current Outpatient Prescriptions on File Prior to Visit  Medication Sig Dispense Refill  . Biotin 10 MG CAPS Take 1 capsule by mouth daily.    . Brexpiprazole 2 MG TABS Take 2 mg by mouth daily.    . cephALEXin (KEFLEX) 500 MG capsule Take 4 prior to dental appt 4 capsule 0  . Coenzyme Q10 120 MG CAPS Take 120 mg by mouth daily.    Marland Kitchen doxepin (SINEQUAN) 100 MG capsule Take 100 mg by mouth at bedtime.    Marland Kitchen estradiol (ESTRACE) 0.5 MG tablet Take 1 tablet (0.5 mg total) by mouth daily. 90 tablet 1  . furosemide (LASIX) 40 MG tablet Take 1 tablet (40 mg total) by mouth daily. 90 tablet 3  . gabapentin (NEURONTIN) 300 MG capsule Take 2 capsules (600 mg total) by mouth 3 (three) times daily. ---must keep appt for further refills 180 capsule 0  . hydrochlorothiazide (HYDRODIURIL) 25 MG tablet Take 1 tablet (25 mg total) by mouth every morning. 60 tablet 0  . Insulin NPH, Human,, Isophane, (HUMULIN N KWIKPEN) 100 UNIT/ML Kiwkpen 55 units with breakfast, and 30 units with the evening meal. 105 mL 3  . Insulin Pen Needle (B-D UF III MINI PEN NEEDLES) 31G X 5 MM MISC Use pen needles with kwik to inject insulin into skin twice a day. 180 each 3  . lamoTRIgine (LAMICTAL) 200 MG tablet Take 1 tablet (200 mg total) by mouth daily at 12 noon. 60 tablet 0  . Levomilnacipran HCl ER (FETZIMA) 40 MG CP24 Take 40 mg by mouth daily.     Marland Kitchen levothyroxine (SYNTHROID) 50 MCG tablet Take 1 tablet (50 mcg total) by mouth daily. 90 tablet 0  . lisinopril (PRINIVIL,ZESTRIL) 10 MG tablet Take 1 tablet (10 mg total) by mouth daily. 90 tablet 3  . Melatonin 3 MG CAPS Take 3 capsules by mouth at bedtime.     . meloxicam (MOBIC) 15 MG  tablet Take 1 tablet (15 mg total) by mouth daily. 90 tablet 0  . Multiple Vitamin (MULTIVITAMIN WITH MINERALS) TABS Take 1 tablet by mouth every evening.    Marland Kitchen oxybutynin (DITROPAN-XL) 10 MG 24 hr tablet Take 10 mg by mouth every evening.     . potassium citrate (UROCIT-K 10) 10 MEQ (1080 MG) SR tablet Take 20 mEq by mouth 2 (two) times daily.     Marland Kitchen PRAZOSIN HCL PO Take 3 mg by mouth at bedtime.     No current facility-administered medications on file prior to visit.    Past Medical History  Diagnosis Date  . GOITER, MULTINODULAR   . HYPOTHYROIDISM   . HYPERCHOLESTEROLEMIA   . Morbid obesity ([place])   . BIPOLAR DISORDER UNSPECIFIED  depression  . Chronic diastolic heart failure ([place])   . ALLERGIC RHINITIS   . FIBROMYALGIA   . GERD (gastroesophageal reflux disease)   . History of wrist fracture     rt wrist  . Depression   . Type II diabetes mellitus ([place])   . Neuropathy due to secondary diabetes ([place])   . Iron deficiency anemia   . Kidney stones     sees urologist @ [facility]  . History of gout     "haven't had it in several years" (02/08/2012)  . PTSD (post-traumatic stress disorder)   . Wears glasses   . Sleep apnea 7/14    mild osa-did not need cpap -dr Gwenette Greet  . PONV (postoperative nausea and vomiting)   . RBBB     Past Surgical History  Procedure Laterality Date  . Cholecystectomy  1985  . Abdominal hysterectomy  1976  . Tubal ligation  1972  . Right nasal surgery  08/1988  . Right sinus removed  08/1989    tooth partial  . Percitania stone removed (l) kidney  1992  . Lithotripsy (l) kidney  1997  . Left cystoscopy   1990  . Thyroidectomy  04/22/2011    Procedure: THYROIDECTOMY;  Surgeon: Onnie Graham, MD;  Location: [facility];  Service: ENT;  Laterality: N/A;  TOTAL THYROIDECOTMY  . Cataract extraction, bilateral  02/2011    epps  . Total knee arthroplasty  06/18/2011    Procedure: TOTAL KNEE ARTHROPLASTY;  Surgeon: Kerin Salen, MD;  Location: [facility];  Service:  Orthopedics;  Laterality: Left;  DEPUY  . Total knee arthroplasty  02/07/2012    Procedure: TOTAL KNEE ARTHROPLASTY;  Surgeon: Kerin Salen, MD;  Location: [facility];  Service: Orthopedics;  Laterality: Right;  . Incisional breast biopsy  2000    right  . Cardiac catheterization  2001    sees Dr Peter Martinique  . Knee arthroscopy  08/2009    right  . Knee arthroscopy with medial menisectomy      left  . Excisional hemorrhoidectomy      "dr cut out in his office" (02/08/2012)  . Left oophorectomy  1980  . Rotator cuff repair  2013    right shoulder x 2  . Joint replacement      rt knee  . Partial mastectomy with needle localization Left 09/01/2012    Procedure: PARTIAL MASTECTOMY WITH NEEDLE LOCALIZATION;  Surgeon: Adin Hector, MD;  Location: [facility];  Service: General;  Laterality: Left;  . Cataract extraction  2014  . Shoulder arthroscopy with rotator cuff repair and subacromial decompression Left 08/20/2013    Procedure: SHOULDER ARTHROSCOPY  AND SUBACROMIAL DECOMPRESSION;  Surgeon: Nita Sells, MD;  Location: [facility];  Service: Orthopedics;  Laterality: Left;  Left shoulder arthroscopy, debridement, subacromial decompression, distal clavical resection    Social History   Social History  . Marital Status: Married    Spouse Name: N/A  . Number of Children: 2  . Years of Education: 14   Occupational History  . disability    Social History Main Topics  . Smoking status: Former Smoker -- 1.00 packs/day for 6 years    Types: Cigarettes    Quit date: 04/06/1983  . Smokeless tobacco: Never Used     Comment: Married, lives with spouse. Disable- 2 grown kids-6 g-kids  . Alcohol Use: No     Comment: none  . Drug Use: No  . Sexual Activity: Not Currently  Other Topics Concern  . Not on file   Social History Narrative   Married, lives with spouse - 2 adult children   diabled    Family History  Problem Relation Age of Onset  . Hypertension Mother    . Diabetes Father   . Hypertension Father   . Hyperlipidemia Father   . Heart attack Other   . Coronary artery disease Other     Review of Systems  Constitutional: Negative for fever, chills, appetite change and fatigue.  HENT: Positive for tinnitus. Negative for hearing loss.   Eyes: Positive for visual disturbance.  Respiratory: Negative for cough, shortness of breath and wheezing.   Cardiovascular: Negative for chest pain, palpitations and leg swelling.  Gastrointestinal: Positive for constipation (with IBS, constipation 2-3 a week). Negative for nausea, abdominal pain, diarrhea and blood in stool.       GERD 4-5 times a week  Endocrine: Positive for polydipsia. Negative for polyuria.  Genitourinary: Negative for dysuria and hematuria.       Weak stream, dark urine  Musculoskeletal: Positive for arthralgias (knee arthritis s/p b/l TKR, shoulder pain).  Skin: Negative for color change and rash.  Neurological: Positive for dizziness, light-headedness and numbness (neuropathy in feet - numbness/tingling and pain). Negative for headaches.       Balance is poor  Psychiatric/Behavioral: Positive for sleep disturbance and dysphoric mood. Negative for suicidal ideas. The patient is nervous/anxious.        Objective:   Filed Vitals:   06/09/15 0804  BP: 120/52  Pulse: 81  Temp: 97.7 F (36.5 C)  Resp: 16   Filed Weights   06/09/15 0804  Weight: 277 lb (125.646 kg)   Body mass index is 49.08 kg/(m^2).   Physical Exam Constitutional: She appears well-developed and well-nourished. No distress.  HENT:  Head: Normocephalic and atraumatic.  Right Ear: External ear normal. Normal ear canal and TM Left Ear: External ear normal.  Normal ear canal and TM Mouth/Throat: Oropharynx is clear and moist.  Normal bilateral ear canals and tympanic membranes  Eyes: Conjunctivae and EOM are normal.  Neck: Neck supple. No tracheal deviation present. No thyromegaly present.  No carotid  bruit  Cardiovascular: Normal rate, regular rhythm and normal heart sounds.   No murmur heard.  Trace LE edema. Pulmonary/Chest: Effort normal and breath sounds normal. No respiratory distress. She has no wheezes. She has no rales.  Breast: deferred Abdominal: Soft. She exhibits no distension. mild tenderness in RUQ w/o rebound or guarding  Lymphadenopathy: She has no cervical adenopathy.  Skin: Skin is warm and dry. She is not diaphoretic.  Psychiatric: She has a normal mood and affect. Her behavior is normal.       Assessment & Plan:   Wellness Exam Immunizations-up-to-date Colonoscopy-due for colonoscopy. She does not wish to have colonoscopy and we discussed:. She will call her insurance company to see if this is covered Mammogram-up-to-date dexa-ordered Eye exam-1 scheduled Hearing loss -has no major concerns Memory concerns/difficulties-she does have some memory concerns. Advised her to monitor for now and we can refer to neurology for further evaluation Independent of ADLs-does have some difficulty due to neuropathy    Physical exam: Screening blood work-ordered,consented to hcv testing Immunizations-up-to-date Colonoscopy-due for colonoscopy. She does not wish to have colonoscopy and we discussed:. She will call her insurance company to see if this is covered Mammogram-up-to-date Dexa - ordered Eye exams -- up to date EKG-done 6 months ago, we will repeat yearly Exercise - not  able to exercise - advised by podiatry/ortho to stay off feet due to some charcot changes in her feet -- may try to do water exercise -- encouraged her to start.  Advised decreased portions Weight-very overweight. She understands the benefits of weight loss Skin  - no concers Substance abuse -- no evidence of abuse   See Problem List for Assessment and Plan of chronic medical problems.   Follow up in 6 months

## 2015-06-09 NOTE — Progress Notes (Signed)
Subjective:    Patient ID: Brittany Acosta, female    DOB: [birth year], 65 y.o.   MRN: [id number]  HPI Pt returns for f/u of diabetes mellitus: DM type: Insulin-requiring type 2 Dx'ed: [id number] Complications: polyneuropathy.   Therapy: insulin since 2004 GDM: never.  DKA: never. Severe hypoglycemia: never. Pancreatitis: never.  Other: in 2012, she was changed to a simple BID insulin regimen, after she did not get good control on multiple daily injections; she says she can only afford inexpensive human insulin; she declines weight-loss surgery; she was told she had bilat charcot feet, but ortho said no.   Interval history: she brings a record of her cbg's which i have reviewed today. It varies from 78-195.  It is in general higher in the afternoon, but not necessarily so.   Past Medical History  Diagnosis Date  . GOITER, MULTINODULAR   . HYPOTHYROIDISM   . HYPERCHOLESTEROLEMIA   . Morbid obesity ([place])   . BIPOLAR DISORDER UNSPECIFIED     depression  . Chronic diastolic heart failure ([place])   . ALLERGIC RHINITIS   . FIBROMYALGIA   . GERD (gastroesophageal reflux disease)   . History of wrist fracture     rt wrist  . Depression   . Type II diabetes mellitus ([place])   . Neuropathy due to secondary diabetes ([place])   . Iron deficiency anemia   . Kidney stones     sees urologist @ [facility]  . History of gout     "haven't had it in several years" (02/08/2012)  . PTSD (post-traumatic stress disorder)   . Wears glasses   . Sleep apnea 7/14    mild osa-did not need cpap -dr Gwenette Greet  . PONV (postoperative nausea and vomiting)   . RBBB     Past Surgical History  Procedure Laterality Date  . Cholecystectomy  1985  . Abdominal hysterectomy  1976  . Tubal ligation  1972  . Right nasal surgery  08/1988  . Right sinus removed  08/1989    tooth partial  . Percitania stone removed (l) kidney  1992  . Lithotripsy (l) kidney  1997  . Left cystoscopy   1990  . Thyroidectomy  04/22/2011    Procedure:  THYROIDECTOMY;  Surgeon: Onnie Graham, MD;  Location: [facility];  Service: ENT;  Laterality: N/A;  TOTAL THYROIDECOTMY  . Cataract extraction, bilateral  02/2011    epps  . Total knee arthroplasty  06/18/2011    Procedure: TOTAL KNEE ARTHROPLASTY;  Surgeon: Kerin Salen, MD;  Location: [facility];  Service: Orthopedics;  Laterality: Left;  DEPUY  . Total knee arthroplasty  02/07/2012    Procedure: TOTAL KNEE ARTHROPLASTY;  Surgeon: Kerin Salen, MD;  Location: [facility];  Service: Orthopedics;  Laterality: Right;  . Incisional breast biopsy  2000    right  . Cardiac catheterization  2001    sees Dr Peter Martinique  . Knee arthroscopy  08/2009    right  . Knee arthroscopy with medial menisectomy      left  . Excisional hemorrhoidectomy      "dr cut out in his office" (02/08/2012)  . Left oophorectomy  1980  . Rotator cuff repair  2013    right shoulder x 2  . Joint replacement      rt knee  . Partial mastectomy with needle localization Left 09/01/2012    Procedure: PARTIAL MASTECTOMY WITH NEEDLE LOCALIZATION;  Surgeon: Adin Hector, MD;  Location: [facility];  Service: General;  Laterality: Left;  . Cataract extraction  2014  . Shoulder arthroscopy with rotator cuff repair and subacromial decompression Left 08/20/2013    Procedure: SHOULDER ARTHROSCOPY  AND SUBACROMIAL DECOMPRESSION;  Surgeon: Nita Sells, MD;  Location: [facility];  Service: Orthopedics;  Laterality: Left;  Left shoulder arthroscopy, debridement, subacromial decompression, distal clavical resection    Social History   Social History  . Marital Status: Married    Spouse Name: N/A  . Number of Children: 2  . Years of Education: 14   Occupational History  . disability    Social History Main Topics  . Smoking status: Former Smoker -- 1.00 packs/day for 6 years    Types: Cigarettes    Quit date: 04/06/1983  . Smokeless tobacco: Never Used     Comment: Married, lives with spouse. Disable- 2 grown kids-6  g-kids  . Alcohol Use: No     Comment: none  . Drug Use: No  . Sexual Activity: Not Currently   Other Topics Concern  . Not on file   Social History Narrative   Married, lives with spouse - 2 adult children   Disabled      No regular exercise    Current Outpatient Prescriptions on File Prior to Visit  Medication Sig Dispense Refill  . Biotin 10 MG CAPS Take 1 capsule by mouth daily.    . cephALEXin (KEFLEX) 500 MG capsule Take 4 prior to dental appt 4 capsule 0  . Coenzyme Q10 120 MG CAPS Take 120 mg by mouth daily.    . Insulin Pen Needle (B-D UF III MINI PEN NEEDLES) 31G X 5 MM MISC Use pen needles with kwik to inject insulin into skin twice a day. 180 each 3  . Melatonin 3 MG CAPS Take 3 capsules by mouth at bedtime.     . Multiple Vitamin (MULTIVITAMIN WITH MINERALS) TABS Take 1 tablet by mouth every evening.     No current facility-administered medications on file prior to visit.    No Known Allergies  Family History  Problem Relation Age of Onset  . Hypertension Mother   . Lung cancer Mother     non-smoker  . Diabetes Father   . Hypertension Father   . Hyperlipidemia Father   . Heart attack Other   . Coronary artery disease Other   . Hypertension Sister   . Hypertension Brother   . Hypertension Brother   . Hypertension Brother   . Hypertension Sister   . Hypertension Sister   . Diabetes Daughter     BP 136/84 mmHg  Pulse 83  Temp(Src) 97.6 F (36.4 C) (Oral)  Ht 5\' 3"  (1.6 m)  Wt 275 lb (124.739 kg)  BMI 48.73 kg/m2  SpO2 95%  Review of Systems She denies hypoglycemia.      Objective:   Physical Exam VITAL SIGNS:  See vs page GENERAL: no distress Pulses: dorsalis pedis intact bilat.   MSK: no deformity of the feet CV: no leg edema Skin:  no ulcer on the feet.  normal color and temp on the feet.  Neuro: sensation is intact to touch on the feet, but decreased from normal.  Ext: There is bilateral onychomycosis of the toenails.  there is  ecchymosis under the right great toenail.  The are bilat heavy calluses.  There is a healing wound at the tip of the left 2nd toe.     A1c=7.1%    Assessment & Plan:  DM: slightly  overcontrolled, given this regimen, which does match insulin to her changing needs throughout the day.  Patient is advised the following: Patient Instructions  Please reduce the insulin to 50 units with breakfast, and 30 units with the evening meal.   check your blood sugar twice a day.  vary the time of day when you check, between before the 3 meals, and at bedtime.  also check if you have symptoms of your blood sugar being too high or too low.  please keep a record of the readings and bring it to your next appointment here (or you can bring the meter itself).  You can write it on any piece of paper.  please call us sooner if your blood sugar goes below 70, or if you have a lot of readings over 200. Please come back for a follow-up appointment in 3 months.

## 2015-06-09 NOTE — Assessment & Plan Note (Signed)
Following with psychiatry Prescription given for her medication for 1 month only-to be prescribed by psychiatry She does not feel that her depression, anxiety and bipolar well-controlled-she will discuss this with psychiatry No suicidal thoughts

## 2015-06-09 NOTE — Patient Instructions (Signed)
Please reduce the insulin to 50 units with breakfast, and 30 units with the evening meal.   check your blood sugar twice a day.  vary the time of day when you check, between before the 3 meals, and at bedtime.  also check if you have symptoms of your blood sugar being too high or too low.  please keep a record of the readings and bring it to your next appointment here (or you can bring the meter itself).  You can write it on any piece of paper.  please call us sooner if your blood sugar goes below 70, or if you have a lot of readings over 200. Please come back for a follow-up appointment in 3 months.

## 2015-06-09 NOTE — Assessment & Plan Note (Signed)
Taking gabapentin 3 times daily, which does help Continue gabapentin

## 2015-06-09 NOTE — Assessment & Plan Note (Signed)
Check tsh  Titrate med dose if needed  

## 2015-06-09 NOTE — Assessment & Plan Note (Signed)
Following with Dr. Loanne Drilling A1c ordered

## 2015-06-09 NOTE — Assessment & Plan Note (Signed)
Blood pressure well controlled Continue current medication 

## 2015-06-09 NOTE — Assessment & Plan Note (Signed)
Was on a PPI, but her psychiatrist or last PCP discontinued medication because of concern with interaction of her other medications Frequent GERD symptoms Start ranitidine 150 mg twice daily-discussed potential consequences of uncontrolled GERD Call if no improvement so we can make changes Stressed weight loss

## 2015-06-10 ENCOUNTER — Telehealth: Payer: Self-pay | Admitting: Internal Medicine

## 2015-06-10 DIAGNOSIS — E1165 Type 2 diabetes mellitus with hyperglycemia: Secondary | ICD-10-CM | POA: Insufficient documentation

## 2015-06-10 DIAGNOSIS — E1159 Type 2 diabetes mellitus with other circulatory complications: Secondary | ICD-10-CM | POA: Insufficient documentation

## 2015-06-10 NOTE — Telephone Encounter (Signed)
Patient states that [facility] is fully covered by her insurance.

## 2015-06-13 NOTE — Telephone Encounter (Signed)
Spoke to pt to inform form has been placed in the mail for her.

## 2015-06-26 ENCOUNTER — Other Ambulatory Visit (INDEPENDENT_AMBULATORY_CARE_PROVIDER_SITE_OTHER): Payer: Medicare HMO

## 2015-06-26 DIAGNOSIS — K219 Gastro-esophageal reflux disease without esophagitis: Secondary | ICD-10-CM

## 2015-06-26 DIAGNOSIS — E1049 Type 1 diabetes mellitus with other diabetic neurological complication: Secondary | ICD-10-CM | POA: Diagnosis not present

## 2015-06-26 DIAGNOSIS — E039 Hypothyroidism, unspecified: Secondary | ICD-10-CM

## 2015-06-26 DIAGNOSIS — IMO0002 Reserved for concepts with insufficient information to code with codable children: Secondary | ICD-10-CM

## 2015-06-26 DIAGNOSIS — I10 Essential (primary) hypertension: Secondary | ICD-10-CM | POA: Diagnosis not present

## 2015-06-26 DIAGNOSIS — G6289 Other specified polyneuropathies: Secondary | ICD-10-CM

## 2015-06-26 DIAGNOSIS — E1065 Type 1 diabetes mellitus with hyperglycemia: Secondary | ICD-10-CM | POA: Diagnosis not present

## 2015-06-26 LAB — CBC WITH DIFFERENTIAL/PLATELET
Basophils Absolute: 0 10*3/uL (ref 0.0–0.1)
Basophils Relative: 0.4 % (ref 0.0–3.0)
Eosinophils Absolute: 0.5 10*3/uL (ref 0.0–0.7)
Eosinophils Relative: 5.1 % — ABNORMAL HIGH (ref 0.0–5.0)
HCT: 36.9 % (ref 36.0–46.0)
Hemoglobin: 12.4 g/dL (ref 12.0–15.0)
Lymphocytes Relative: 31 % (ref 12.0–46.0)
Lymphs Abs: 3 10*3/uL (ref 0.7–4.0)
MCHC: 33.5 g/dL (ref 30.0–36.0)
MCV: 84.6 fl (ref 78.0–100.0)
Monocytes Absolute: 0.5 10*3/uL (ref 0.1–1.0)
Monocytes Relative: 5.2 % (ref 3.0–12.0)
Neutro Abs: 5.7 10*3/uL (ref 1.4–7.7)
Neutrophils Relative %: 58.3 % (ref 43.0–77.0)
Platelets: 409 10*3/uL — ABNORMAL HIGH (ref 150.0–400.0)
RBC: 4.36 Mil/uL (ref 3.87–5.11)
RDW: 15.1 % (ref 11.5–15.5)
WBC: 9.7 10*3/uL (ref 4.0–10.5)

## 2015-06-26 LAB — LIPID PANEL
Cholesterol: 157 mg/dL (ref 0–200)
HDL: 55.4 mg/dL (ref >39.00)
LDL Cholesterol: 89 mg/dL (ref 0–99)
NonHDL: 101.7
Total CHOL/HDL Ratio: 3
Triglycerides: 64 mg/dL (ref 0.0–149.0)
VLDL: 12.8 mg/dL (ref 0.0–40.0)

## 2015-06-26 LAB — COMPREHENSIVE METABOLIC PANEL
ALT: 27 U/L (ref 0–35)
AST: 22 U/L (ref 0–37)
Albumin: 3.7 g/dL (ref 3.5–5.2)
Alkaline Phosphatase: 98 U/L (ref 39–117)
BUN: 17 mg/dL (ref 6–23)
CO2: 33 mEq/L — ABNORMAL HIGH (ref 19–32)
Calcium: 9.3 mg/dL (ref 8.4–10.5)
Chloride: 100 mEq/L (ref 96–112)
Creatinine, Ser: 0.88 mg/dL (ref 0.40–1.20)
GFR: 68.57 mL/min (ref >60.00)
Glucose, Bld: 153 mg/dL — ABNORMAL HIGH (ref 70–99)
Potassium: 3.9 mEq/L (ref 3.5–5.1)
Sodium: 139 mEq/L (ref 135–145)
Total Bilirubin: 0.4 mg/dL (ref 0.2–1.2)
Total Protein: 6.9 g/dL (ref 6.0–8.3)

## 2015-06-26 LAB — TSH: TSH: 1.82 u[IU]/mL (ref 0.35–4.50)

## 2015-06-26 LAB — HEMOGLOBIN A1C: Hgb A1c MFr Bld: 7.9 % — ABNORMAL HIGH (ref 4.6–6.5)

## 2015-06-26 LAB — MICROALBUMIN / CREATININE URINE RATIO
Creatinine,U: 36.6 mg/dL
Microalb Creat Ratio: 1.9 mg/g (ref 0.0–30.0)
Microalb, Ur: <0.7 mg/dL (ref 0.0–1.9)

## 2015-06-30 LAB — HM DIABETES EYE EXAM

## 2015-07-01 ENCOUNTER — Telehealth: Payer: Self-pay | Admitting: Internal Medicine

## 2015-07-01 DIAGNOSIS — F319 Bipolar disorder, unspecified: Secondary | ICD-10-CM

## 2015-07-01 NOTE — Telephone Encounter (Signed)
Please advise 

## 2015-07-01 NOTE — Telephone Encounter (Signed)
Pt is looking for a psychiatrist and she found Dr. Greer Ee in [place] who is in network with her insurance.  In order for them to schedule an appointment they will need a referral. Or if you know of another that would be in her network.

## 2015-07-01 NOTE — Telephone Encounter (Signed)
Spoke with pt to inform.  

## 2015-07-01 NOTE — Telephone Encounter (Signed)
ordered

## 2015-07-02 ENCOUNTER — Encounter: Payer: Self-pay | Admitting: Endocrinology

## 2015-07-13 ENCOUNTER — Other Ambulatory Visit: Payer: Self-pay | Admitting: Internal Medicine

## 2015-07-15 NOTE — Telephone Encounter (Signed)
Called [facility], stated they were in need of the pts phone number to contact her. Phone number was given. Called pt to inform.

## 2015-07-15 NOTE — Telephone Encounter (Signed)
Pt called in to check the status of [facility]. Please advise pt .   Thanks.   Call Back # [phone]

## 2015-07-22 ENCOUNTER — Other Ambulatory Visit: Payer: Self-pay | Admitting: Internal Medicine

## 2015-07-22 ENCOUNTER — Telehealth: Payer: Self-pay | Admitting: Endocrinology

## 2015-07-22 MED ORDER — INSULIN PEN NEEDLE 31G X 5 MM MISC: Status: DC

## 2015-07-22 MED ORDER — INSULIN ISOPHANE HUMAN 100 UNIT/ML KWIKPEN: PEN_INJECTOR | SUBCUTANEOUS | Status: DC

## 2015-07-22 NOTE — Telephone Encounter (Signed)
PT needs Humulin N Kwikpen and Pen Needles to [facility] in [place]

## 2015-07-22 NOTE — Telephone Encounter (Signed)
Rx submitted per pt's request.  

## 2015-07-23 ENCOUNTER — Telehealth: Payer: Self-pay | Admitting: Endocrinology

## 2015-07-23 MED ORDER — INSULIN ISOPHANE HUMAN 100 UNIT/ML KWIKPEN: PEN_INJECTOR | SUBCUTANEOUS | Status: DC

## 2015-07-23 NOTE — Telephone Encounter (Signed)
Rx resubmitted

## 2015-07-23 NOTE — Telephone Encounter (Signed)
Pt said that the pharmacy did not receive the insulin Rx that was sent in the other day

## 2015-07-24 ENCOUNTER — Telehealth: Payer: Self-pay | Admitting: Endocrinology

## 2015-07-24 MED ORDER — BASAGLAR KWIKPEN 100 UNIT/ML ~~LOC~~ SOPN: 80.0000 [IU] | PEN_INJECTOR | SUBCUTANEOUS | Status: DC

## 2015-07-24 NOTE — Telephone Encounter (Signed)
please call patient: Brittany Acosta wants prior auth for your insulin Options are: 1.  Change to syringe and vial 2.  Get prior auth for current insulin.  They would need to know why you need to use the pen 3. Change to qd insulin in a pen  Please let me know.

## 2015-07-24 NOTE — Telephone Encounter (Signed)
Attempted to reach the pt. Pt was unavailable. Will try again at a later time.  

## 2015-07-24 NOTE — Telephone Encounter (Signed)
I contacted the pt. She would like to try the QD insulin at this time.

## 2015-07-24 NOTE — Telephone Encounter (Signed)
I contacted the pt and advised we are starting her on 80 units of basaglar daily. Pt voiced understanding and will call if she has any issues picking the medication up.

## 2015-07-24 NOTE — Telephone Encounter (Signed)
Ok. i have sent a prescription to your pharmacy

## 2015-07-25 ENCOUNTER — Telehealth: Payer: Self-pay | Admitting: Endocrinology

## 2015-07-25 ENCOUNTER — Other Ambulatory Visit: Payer: Self-pay

## 2015-07-25 MED ORDER — INSULIN DEGLUDEC 100 UNIT/ML ~~LOC~~ SOPN: 80.0000 [IU] | PEN_INJECTOR | Freq: Every morning | SUBCUTANEOUS | Status: DC

## 2015-07-25 NOTE — Telephone Encounter (Signed)
I contacted the pt advised per insurance we needed to switch the Lantus to [place]. Pt voiced understanding.

## 2015-07-25 NOTE — Telephone Encounter (Signed)
Returning call.

## 2015-08-05 ENCOUNTER — Telehealth: Payer: Self-pay | Admitting: Endocrinology

## 2015-08-05 MED ORDER — INSULIN DEGLUDEC 100 UNIT/ML ~~LOC~~ SOPN: 100.0000 [IU] | PEN_INJECTOR | Freq: Every morning | SUBCUTANEOUS | Status: DC

## 2015-08-05 NOTE — Telephone Encounter (Signed)
Pt advised of note below and voiced understanding.  

## 2015-08-05 NOTE — Telephone Encounter (Signed)
See note below and please advise, Thanks! 

## 2015-08-05 NOTE — Telephone Encounter (Signed)
Increase tresiba to 100 units qam.

## 2015-08-05 NOTE — Telephone Encounter (Signed)
Please increase the Humulin N to 65 u in the morning and 30 in the evening.

## 2015-08-05 NOTE — Telephone Encounter (Signed)
Pt wanted to let Dr. Loanne Drilling know that the past few weeks that her sugars in the morning have been ranging between 150-250

## 2015-08-05 NOTE — Telephone Encounter (Signed)
I contacted the pt and advised of note below. Pt stated she never started the [place] due to the price. Pt is currently taking Humulin N 50 u in the morning and 30 at night.  Please advise, Thanks!

## 2015-08-13 ENCOUNTER — Other Ambulatory Visit: Payer: Self-pay | Admitting: Emergency Medicine

## 2015-08-13 MED ORDER — DOXEPIN HCL 100 MG PO CAPS: 100.0000 mg | ORAL_CAPSULE | Freq: Every day | ORAL | Status: DC

## 2015-08-26 ENCOUNTER — Ambulatory Visit (HOSPITAL_COMMUNITY): Payer: Medicare HMO | Admitting: Psychiatry

## 2015-09-08 ENCOUNTER — Ambulatory Visit: Payer: Medicare HMO | Admitting: Endocrinology

## 2015-09-09 ENCOUNTER — Ambulatory Visit: Payer: Medicare HMO | Admitting: Endocrinology

## 2015-09-11 ENCOUNTER — Telehealth: Payer: Self-pay

## 2015-09-11 NOTE — Telephone Encounter (Signed)
Patient called about her cologaud results. Brittany Acosta its been a month and she has not heard anything yet. Please follow up.

## 2015-09-15 NOTE — Telephone Encounter (Signed)
Spoke with pt and Cologuard. Cologuard states that a new shipment for processing has been sent to the pt and pt has been contacted.

## 2015-09-24 ENCOUNTER — Telehealth: Payer: Self-pay | Admitting: Endocrinology

## 2015-09-24 NOTE — Telephone Encounter (Signed)
I contacted the pt and advised of note below via voicemail. Requested a call back if the pt would like to discuss.  

## 2015-09-24 NOTE — Telephone Encounter (Signed)
See note below and please advise during Dr. Ellison's absence? Thanks! 

## 2015-09-24 NOTE — Telephone Encounter (Signed)
She will have to get the relion brand insulin bottles of N from [facility] and inject with a syringe

## 2015-09-24 NOTE — Telephone Encounter (Signed)
Patient stated that she will be totally out of insulin Humulin N Friday, insurance don't kick in until July 1st, she want to know if she can get some help getting medication until then. Please advise

## 2015-10-01 ENCOUNTER — Telehealth: Payer: Self-pay

## 2015-10-01 DIAGNOSIS — E1142 Type 2 diabetes mellitus with diabetic polyneuropathy: Secondary | ICD-10-CM

## 2015-10-01 DIAGNOSIS — Z794 Long term (current) use of insulin: Secondary | ICD-10-CM

## 2015-10-01 DIAGNOSIS — G6289 Other specified polyneuropathies: Secondary | ICD-10-CM

## 2015-10-01 NOTE — Telephone Encounter (Signed)
Patient called and says she needs a referral to see DR. Ajlou. Please follow up or advise.

## 2015-10-02 NOTE — Telephone Encounter (Signed)
Spoke with Pt to confirm. Pt is needing referral to Podiatrist  Dr Rosemary Holms.

## 2015-10-03 NOTE — Telephone Encounter (Signed)
ordered

## 2015-10-08 DIAGNOSIS — N200 Calculus of kidney: Secondary | ICD-10-CM | POA: Diagnosis not present

## 2015-10-10 ENCOUNTER — Telehealth: Payer: Self-pay

## 2015-10-10 ENCOUNTER — Ambulatory Visit: Payer: Self-pay | Admitting: Endocrinology

## 2015-10-10 DIAGNOSIS — Z85828 Personal history of other malignant neoplasm of skin: Secondary | ICD-10-CM

## 2015-10-10 DIAGNOSIS — E039 Hypothyroidism, unspecified: Secondary | ICD-10-CM

## 2015-10-10 DIAGNOSIS — Z794 Long term (current) use of insulin: Secondary | ICD-10-CM

## 2015-10-10 DIAGNOSIS — N200 Calculus of kidney: Secondary | ICD-10-CM

## 2015-10-10 DIAGNOSIS — E1142 Type 2 diabetes mellitus with diabetic polyneuropathy: Secondary | ICD-10-CM

## 2015-10-10 NOTE — Telephone Encounter (Signed)
Please advise 

## 2015-10-10 NOTE — Telephone Encounter (Signed)
Patient called and needs a referral to see [facility] [phone].  Also needs a referral to Endocrinology to seen Renato Shin [phone] As well as a referral to [facility] dermatology [phone] Please advise or follow up.  She had a change in insurance and now needs referrals to these doctors.

## 2015-10-11 ENCOUNTER — Other Ambulatory Visit: Payer: Self-pay | Admitting: Internal Medicine

## 2015-10-11 NOTE — Telephone Encounter (Signed)
Referrals pending - just need to know reason for derm and urology referrals

## 2015-10-13 NOTE — Telephone Encounter (Signed)
Kidney stone for urology Hx of skin cancer

## 2015-10-13 NOTE — Telephone Encounter (Signed)
Referrals ordered.

## 2015-10-27 ENCOUNTER — Ambulatory Visit: Payer: Self-pay | Admitting: Endocrinology

## 2015-11-13 ENCOUNTER — Other Ambulatory Visit: Payer: Self-pay | Admitting: Internal Medicine

## 2015-11-17 ENCOUNTER — Encounter: Payer: Self-pay | Admitting: Endocrinology

## 2015-11-17 ENCOUNTER — Ambulatory Visit (INDEPENDENT_AMBULATORY_CARE_PROVIDER_SITE_OTHER): Payer: Commercial Managed Care - HMO | Admitting: Endocrinology

## 2015-11-17 VITALS — BP 119/66 | HR 76 | Temp 97.7°F | Ht 63.5 in | Wt 269.4 lb

## 2015-11-17 DIAGNOSIS — E118 Type 2 diabetes mellitus with unspecified complications: Secondary | ICD-10-CM

## 2015-11-17 LAB — POCT GLYCOSYLATED HEMOGLOBIN (HGB A1C): Hemoglobin A1C: 6.8

## 2015-11-17 MED ORDER — INSULIN NPH (HUMAN) (ISOPHANE) 100 UNIT/ML ~~LOC~~ SUSP: SUBCUTANEOUS | 11 refills | Status: DC

## 2015-11-17 NOTE — Progress Notes (Signed)
Pre visit review using our clinic tool,if applicable. No additional management support is needed unless otherwise documented below in the visit note.  

## 2015-11-17 NOTE — Progress Notes (Signed)
Subjective:    Patient ID: Brittany Acosta, female    DOB: 09-14-1950, 65 y.o.   MRN: [id number]  HPI Pt returns for f/u of diabetes mellitus: DM type: Insulin-requiring type 2 Dx'ed: [id number] Complications: polyneuropathy.   Therapy: insulin since 2004 GDM: never.  DKA: never. Severe hypoglycemia: never.  Pancreatitis: never.  Other: in 2012, she was changed to a simple BID insulin regimen, after poor results with multiple daily injections.  She cannot afford insulin analogs.  she declines weight-loss surgery; she was told she had bilat charcot feet, but ortho said no.   Interval history: she takes NPH, 65 units QAM and 30 units QPM.  She has frequent hypoglycemia at any time of day.  she brings a record of her cbg's which i have reviewed today.  It varies from 65-200.  There is no trend throughout the day.   Past Medical History:  Diagnosis Date  . ALLERGIC RHINITIS   . BIPOLAR DISORDER UNSPECIFIED    depression  . Chronic diastolic heart failure ([place])   . Depression   . FIBROMYALGIA   . GERD (gastroesophageal reflux disease)   . GOITER, MULTINODULAR   . History of gout    "haven't had it in several years" (02/08/2012)  . History of wrist fracture    rt wrist  . HYPERCHOLESTEROLEMIA   . HYPOTHYROIDISM   . Iron deficiency anemia   . Kidney stones    sees urologist @ [place]  . Morbid obesity ([place])   . Neuropathy due to secondary diabetes ([place])   . PONV (postoperative nausea and vomiting)   . PTSD (post-traumatic stress disorder)   . RBBB   . Sleep apnea 7/14   mild osa-did not need cpap -dr Gwenette Greet  . Type II diabetes mellitus ([place])   . Wears glasses     Past Surgical History:  Procedure Laterality Date  . ABDOMINAL HYSTERECTOMY  1976  . CARDIAC CATHETERIZATION  2001   sees Dr Peter Martinique  . CATARACT EXTRACTION  2014  . CATARACT EXTRACTION, BILATERAL  02/2011   epps  . CHOLECYSTECTOMY  1985  . EXCISIONAL HEMORRHOIDECTOMY     "dr cut out in his office" (02/08/2012)  .  INCISIONAL BREAST BIOPSY  2000   right  . JOINT REPLACEMENT     rt knee  . KNEE ARTHROSCOPY  08/2009   right  . KNEE ARTHROSCOPY WITH MEDIAL MENISECTOMY     left  . Left Cystoscopy   1990  . LEFT OOPHORECTOMY  1980  . Lithotripsy (L) Kidney  1997  . PARTIAL MASTECTOMY WITH NEEDLE LOCALIZATION Left 09/01/2012   Procedure: PARTIAL MASTECTOMY WITH NEEDLE LOCALIZATION;  Surgeon: Adin Hector, MD;  Location: [facility];  Service: General;  Laterality: Left;  . Percitania stone removed (L) Kidney  1992  . Right nasal surgery  08/1988  . Right sinus removed  08/1989   tooth partial  . ROTATOR CUFF REPAIR  2013   right shoulder x 2  . SHOULDER ARTHROSCOPY WITH ROTATOR CUFF REPAIR AND SUBACROMIAL DECOMPRESSION Left 08/20/2013   Procedure: SHOULDER ARTHROSCOPY  AND SUBACROMIAL DECOMPRESSION;  Surgeon: Nita Sells, MD;  Location: [facility];  Service: Orthopedics;  Laterality: Left;  Left shoulder arthroscopy, debridement, subacromial decompression, distal clavical resection  . THYROIDECTOMY  04/22/2011   Procedure: THYROIDECTOMY;  Surgeon: Onnie Graham, MD;  Location: [facility];  Service: ENT;  Laterality: N/A;  TOTAL THYROIDECOTMY  . TOTAL KNEE ARTHROPLASTY  06/18/2011   Procedure: TOTAL  KNEE ARTHROPLASTY;  Surgeon: Kerin Salen, MD;  Location: [facility];  Service: Orthopedics;  Laterality: Left;  DEPUY  . TOTAL KNEE ARTHROPLASTY  02/07/2012   Procedure: TOTAL KNEE ARTHROPLASTY;  Surgeon: Kerin Salen, MD;  Location: [facility];  Service: Orthopedics;  Laterality: Right;  . TUBAL LIGATION  1972    Social History   Social History  . Marital status: Married    Spouse name: N/A  . Number of children: 2  . Years of education: 14   Occupational History  . disability Retired   Social History Main Topics  . Smoking status: Former Smoker    Packs/day: 1.00    Years: 6.00    Types: Cigarettes    Quit date: 04/06/1983  . Smokeless tobacco: Never Used     Comment: Married, lives  with spouse. Disable- 2 grown kids-6 g-kids  . Alcohol use No     Comment: none  . Drug use: No  . Sexual activity: Not Currently   Other Topics Concern  . Not on file   Social History Narrative   Married, lives with spouse - 2 adult children   Disabled      No regular exercise    Current Outpatient Prescriptions on File Prior to Visit  Medication Sig Dispense Refill  . Biotin 10 MG CAPS Take 1 capsule by mouth daily.    . Coenzyme Q10 120 MG CAPS Take 120 mg by mouth daily.    Marland Kitchen doxepin (SINEQUAN) 100 MG capsule Take 1 capsule (100 mg total) by mouth at bedtime. 30 capsule 5  . estradiol (ESTRACE) 0.5 MG tablet Take 1 tablet (0.5 mg total) by mouth daily. 90 tablet 1  . furosemide (LASIX) 40 MG tablet Take 1 tablet (40 mg total) by mouth daily. 90 tablet 3  . gabapentin (NEURONTIN) 300 MG capsule Take 2 capsules (600 mg total) by mouth 3 (three) times daily. 180 capsule 3  . hydrochlorothiazide (HYDRODIURIL) 25 MG tablet Take 1 tablet (25 mg total) by mouth every morning. 60 tablet 0  . lamoTRIgine (LAMICTAL) 200 MG tablet Take 1 tablet (200 mg total) by mouth daily at 12 noon. 60 tablet 0  . levothyroxine (SYNTHROID) 50 MCG tablet Take 1 tablet (50 mcg total) by mouth daily. 90 tablet 1  . lisinopril (PRINIVIL,ZESTRIL) 10 MG tablet Take 1 tablet (10 mg total) by mouth daily. 90 tablet 3  . Melatonin 3 MG CAPS Take 3 capsules by mouth at bedtime.     . meloxicam (MOBIC) 15 MG tablet TAKE ONE TABLET BY MOUTH ONCE DAILY 90 tablet 0  . Multiple Vitamin (MULTIVITAMIN WITH MINERALS) TABS Take 1 tablet by mouth every evening.    Marland Kitchen oxybutynin (DITROPAN-XL) 10 MG 24 hr tablet Take 1 tablet (10 mg total) by mouth every evening. 90 tablet 1  . potassium citrate (UROCIT-K 10) 10 MEQ (1080 MG) SR tablet Take 2 tablets (20 mEq total) by mouth 2 (two) times daily. 180 tablet 1  . prazosin (MINIPRESS) 1 MG capsule Take 3 capsules (3 mg total) by mouth at bedtime. 90 capsule 0  . ranitidine  (ZANTAC) 150 MG tablet Take 1 tablet (150 mg total) by mouth 2 (two) times daily. 180 tablet 3  . cephALEXin (KEFLEX) 500 MG capsule Take 4 prior to dental appt (Patient not taking: Reported on 11/17/2015) 4 capsule 0  . Insulin Pen Needle (B-D UF III MINI PEN NEEDLES) 31G X 5 MM MISC Use pen needles with kwik to inject insulin  into skin twice a day. (Patient not taking: Reported on 11/17/2015) 180 each 3  . Levomilnacipran HCl ER (FETZIMA) 40 MG CP24 Take 40 mg by mouth daily. (Patient not taking: Reported on 11/17/2015) 30 capsule 0  . REXULTI 2 MG TABS TAKE ONE TABLET BY MOUTH ONCE DAILY (Patient not taking: Reported on 11/17/2015) 30 tablet 0   No current facility-administered medications on file prior to visit.     No Known Allergies  Family History  Problem Relation Age of Onset  . Hypertension Mother   . Lung cancer Mother     non-smoker  . Diabetes Father   . Hypertension Father   . Hyperlipidemia Father   . Heart attack Other   . Coronary artery disease Other   . Hypertension Sister   . Hypertension Brother   . Hypertension Brother   . Hypertension Brother   . Hypertension Sister   . Hypertension Sister   . Diabetes Daughter     BP 119/66   Pulse 76   Temp 97.7 F (36.5 C) (Oral)   Ht 5' 3.5" (1.613 m)   Wt 269 lb 6.4 oz (122.2 kg)   SpO2 98%   BMI 46.97 kg/m   Review of Systems Denies LOC.      Objective:   Physical Exam VITAL SIGNS:  See vs page GENERAL: no distress Pulses: dorsalis pedis intact bilat.   MSK: no deformity of the feet.   CV: no leg edema.   Skin:  no ulcer on the feet.  normal color and temp on the feet.  Neuro: sensation is intact to touch on the feet, but severely decreased from normal.  Ext: There is bilateral onychomycosis of the toenails.     A1c=6.8%       Assessment & Plan:  Insulin-requiring type 2, overcontrolled, given this regimen, which does match insulin to her changing needs throughout the day

## 2015-11-17 NOTE — Patient Instructions (Addendum)
Please reduce the insulin to 65 units each morning, and 25 units in the evening. check your blood sugar twice a day.  vary the time of day when you check, between before the 3 meals, and at bedtime.  also check if you have symptoms of your blood sugar being too high or too low.  please keep a record of the readings and bring it to your next appointment here (or you can bring the meter itself).  You can write it on any piece of paper.  please call us sooner if your blood sugar goes below 70, or if you have a lot of readings over 200.   Please come back for a follow-up appointment in 3 months.

## 2015-11-20 ENCOUNTER — Other Ambulatory Visit: Payer: Self-pay | Admitting: Internal Medicine

## 2015-11-25 ENCOUNTER — Other Ambulatory Visit: Payer: Self-pay | Admitting: Emergency Medicine

## 2015-11-25 MED ORDER — POTASSIUM CITRATE ER 10 MEQ (1080 MG) PO TBCR
20.0000 meq | EXTENDED_RELEASE_TABLET | Freq: Two times a day (BID) | ORAL | 1 refills | Status: DC
Start: 2015-11-25 — End: 2015-12-24

## 2015-11-29 ENCOUNTER — Other Ambulatory Visit: Payer: Self-pay | Admitting: Internal Medicine

## 2015-12-15 ENCOUNTER — Ambulatory Visit (INDEPENDENT_AMBULATORY_CARE_PROVIDER_SITE_OTHER): Payer: Commercial Managed Care - HMO | Admitting: Internal Medicine

## 2015-12-15 ENCOUNTER — Encounter: Payer: Self-pay | Admitting: Internal Medicine

## 2015-12-15 ENCOUNTER — Other Ambulatory Visit (INDEPENDENT_AMBULATORY_CARE_PROVIDER_SITE_OTHER): Payer: Commercial Managed Care - HMO

## 2015-12-15 VITALS — BP 114/60 | HR 97 | Temp 97.6°F | Resp 16 | Wt 272.0 lb

## 2015-12-15 DIAGNOSIS — E1142 Type 2 diabetes mellitus with diabetic polyneuropathy: Secondary | ICD-10-CM

## 2015-12-15 DIAGNOSIS — Z794 Long term (current) use of insulin: Secondary | ICD-10-CM

## 2015-12-15 DIAGNOSIS — R079 Chest pain, unspecified: Secondary | ICD-10-CM

## 2015-12-15 DIAGNOSIS — G6289 Other specified polyneuropathies: Secondary | ICD-10-CM

## 2015-12-15 DIAGNOSIS — E039 Hypothyroidism, unspecified: Secondary | ICD-10-CM

## 2015-12-15 DIAGNOSIS — F319 Bipolar disorder, unspecified: Secondary | ICD-10-CM

## 2015-12-15 DIAGNOSIS — Z780 Asymptomatic menopausal state: Secondary | ICD-10-CM

## 2015-12-15 DIAGNOSIS — Z1159 Encounter for screening for other viral diseases: Secondary | ICD-10-CM | POA: Diagnosis not present

## 2015-12-15 DIAGNOSIS — Z23 Encounter for immunization: Secondary | ICD-10-CM

## 2015-12-15 DIAGNOSIS — I10 Essential (primary) hypertension: Secondary | ICD-10-CM | POA: Diagnosis not present

## 2015-12-15 DIAGNOSIS — K219 Gastro-esophageal reflux disease without esophagitis: Secondary | ICD-10-CM | POA: Diagnosis not present

## 2015-12-15 DIAGNOSIS — C4490 Unspecified malignant neoplasm of skin, unspecified: Secondary | ICD-10-CM

## 2015-12-15 LAB — COMPREHENSIVE METABOLIC PANEL
ALT: 19 U/L (ref 0–35)
AST: 20 U/L (ref 0–37)
Albumin: 3.8 g/dL (ref 3.5–5.2)
Alkaline Phosphatase: 79 U/L (ref 39–117)
BUN: 18 mg/dL (ref 6–23)
CO2: 31 mEq/L (ref 19–32)
Calcium: 8.9 mg/dL (ref 8.4–10.5)
Chloride: 99 mEq/L (ref 96–112)
Creatinine, Ser: 1.07 mg/dL (ref 0.40–1.20)
GFR: 54.64 mL/min — ABNORMAL LOW (ref >60.00)
Glucose, Bld: 52 mg/dL — ABNORMAL LOW (ref 70–99)
Potassium: 3.5 mEq/L (ref 3.5–5.1)
Sodium: 139 mEq/L (ref 135–145)
Total Bilirubin: 0.3 mg/dL (ref 0.2–1.2)
Total Protein: 6.7 g/dL (ref 6.0–8.3)

## 2015-12-15 LAB — TROPONIN I: TNIDX: 0.03 ug/l (ref 0.00–0.06)

## 2015-12-15 LAB — HEPATITIS C ANTIBODY: HCV Ab: NEGATIVE

## 2015-12-15 LAB — TSH: TSH: 2.09 u[IU]/mL (ref 0.35–4.50)

## 2015-12-15 MED ORDER — OMEPRAZOLE 20 MG PO CPDR: 20.0000 mg | DELAYED_RELEASE_CAPSULE | Freq: Every day | ORAL | 5 refills | Status: DC

## 2015-12-15 NOTE — Assessment & Plan Note (Signed)
Stressed weight loss. She is unable to exercise, but encouraged her to be as active as possible Encouraged decreased portions

## 2015-12-15 NOTE — Assessment & Plan Note (Signed)
No longer following with psychiatry and to her medications have been discontinued She does feel some depression, but denies suicidal thoughts She does feel that she needs to find a new psychiatrist-I offered to refer her, but she declined Advised her to let me know if she would like referral to a psychiatrist Advised her to monitor her depression closely

## 2015-12-15 NOTE — Assessment & Plan Note (Addendum)
Continue gabapentin at current dose Her feet bother her, but she was not interested in increasing the dose of her medication at this time.

## 2015-12-15 NOTE — Assessment & Plan Note (Signed)
Following with Dr. Loanne Drilling She is experiencing some hypoglycemia, but is not checking her sugars regularly. Advised her to check her sugars more often, especially if she feels they are low Advised her to call Dr. Loanne Drilling to adjust her medication if she is experiencing low sugars Discussed the importance of weight loss

## 2015-12-15 NOTE — Patient Instructions (Addendum)
  Test(s) ordered today. Your results will be released to [place] (or called to you) after review, usually within 72hours after test completion. If any changes need to be made, you will be notified at that same time.  All other Health Maintenance issues reviewed.   All recommended immunizations and age-appropriate screenings are up-to-date or discussed.  Flu vaccine administered today.   Medications reviewed and updated.  Changes recommended at this time include start omeprazole for your heartburn.  You can take the ranitidine as needed.  Your prescription(s) have been submitted to your pharmacy. Please take as directed and contact our office if you believe you are having problem(s) with the medication(s).  A referral was ordered for dermatology.  Please followup in 6 months

## 2015-12-15 NOTE — Assessment & Plan Note (Signed)
BP well controlled Current regimen effective and well tolerated Continue current medications at current doses cmp  

## 2015-12-15 NOTE — Progress Notes (Signed)
Subjective:    Patient ID: Brittany Acosta, female    DOB: [birth year], 65 y.o.   MRN: [id number]  HPI The patient is here for follow up.  Diabetes: She is following with Dr Loanne Drilling.  Her last a1c was 6.8%.  She is taking her medication daily as prescribed. She is compliant with a diabetic diet. She is not exercising regularly. She monitors her sugars and they have been running normal, but does have hypoglycemia - sometimes she checks it and sometimes she just feels it. She checks her feet daily and denies foot lesions. She is up-to-date with an ophthalmology examination.   Peripheral neuropathy:  She has pain in her feet.  She is taking gabapentin and feels it does help.  She is able to sleep ok.    Hypothyroidism:  She is taking her medication daily.  She denies any recent changes in energy or weight that are unexplained.   Hypertension: She is taking her medication daily. She is compliant with a low sodium diet.  She denies chest pain, palpitations, edema, shortness of breath and lightheadedness. She is not exercising regularly.    GERD:  She is taking her medication twice daily as prescribed.  She has GERD symptoms every other day.   Postmenopausal, HRT:  She is taking the estrace daily. She denies any symptoms consistent with hot flashes.  Bipolar disorder, depression: She is no longer following with her psychiatrist because of the co-pay. She has stopped to her medications. She does feel slightly depressed. She thinks she probably doesn't need to see another psychiatrist, but has had difficulty finding one in her network. She denies any suicidal or homicidal thoughts.  Medications and allergies reviewed with patient and updated if appropriate.  Patient Active Problem List   Diagnosis Date Noted  . Diabetes ([place]) 06/10/2015  . Family history of early CAD 04/18/2015  . Right bundle branch block 04/18/2015  . Bifascicular block 04/18/2015  . Tailbone injury 11/05/2014  . Lumbar  radiculopathy 05/31/2014  . Greater trochanteric bursitis of left hip 04/22/2014  . Achilles tendinosis 02/19/2014  . Gait disorder 11/14/2013  . Recurrent falls 11/14/2013  . Multiple fractures 11/14/2013  . Pain in joint, ankle and foot 07/02/2013  . Peripheral neuropathy ([place]) 09/13/2012  . Migraine 07/10/2012  . Osteoarthritis of right knee 02/10/2012  . Osteoarthritis of left knee 06/21/2011  . GERD (gastroesophageal reflux disease) 02/09/2011  . Unspecified vitamin D deficiency 08/27/2010  . FIBROMYALGIA 03/23/2010  . GOITER, MULTINODULAR 03/24/2009  . Edema 09/06/2008  . Morbid obesity ([place]) 09/03/2008  . Hypothyroidism 06/10/2008  . HYPERCHOLESTEROLEMIA 06/10/2008  . BIPOLAR DISORDER UNSPECIFIED 06/10/2008  . Essential hypertension 06/10/2008  . ALLERGIC RHINITIS 06/10/2008  . Asymptomatic postmenopausal status 06/10/2008    Current Outpatient Prescriptions on File Prior to Visit  Medication Sig Dispense Refill  . Biotin 10 MG CAPS Take 1 capsule by mouth daily.    . Coenzyme Q10 120 MG CAPS Take 120 mg by mouth daily.    Marland Kitchen doxepin (SINEQUAN) 100 MG capsule Take 1 capsule (100 mg total) by mouth at bedtime. 30 capsule 5  . estradiol (ESTRACE) 0.5 MG tablet Take 1 tablet (0.5 mg total) by mouth daily. 90 tablet 1  . furosemide (LASIX) 40 MG tablet Take 1 tablet (40 mg total) by mouth daily. 90 tablet 3  . gabapentin (NEURONTIN) 300 MG capsule TAKE TWO CAPSULES BY MOUTH THREE TIMES DAILY 180 capsule 5  . hydrochlorothiazide (HYDRODIURIL) 25 MG tablet Take 1  tablet (25 mg total) by mouth every morning. 60 tablet 0  . insulin NPH Human (NOVOLIN N) 100 UNIT/ML injection 65 units each morning, and 25 units each evening, and syringes 1/day 30 mL 11  . Insulin Pen Needle (B-D UF III MINI PEN NEEDLES) 31G X 5 MM MISC Use pen needles with kwik to inject insulin into skin twice a day. 180 each 3  . lamoTRIgine (LAMICTAL) 200 MG tablet Take 1 tablet (200 mg total) by mouth daily at 12  noon. 60 tablet 0  . levothyroxine (SYNTHROID) 50 MCG tablet Take 1 tablet (50 mcg total) by mouth daily. 90 tablet 1  . lisinopril (PRINIVIL,ZESTRIL) 10 MG tablet Take 1 tablet (10 mg total) by mouth daily. 90 tablet 3  . Melatonin 3 MG CAPS Take 10 capsules by mouth at bedtime.     . meloxicam (MOBIC) 15 MG tablet TAKE ONE TABLET BY MOUTH ONCE DAILY 90 tablet 0  . Multiple Vitamin (MULTIVITAMIN WITH MINERALS) TABS Take 1 tablet by mouth every evening.    Marland Kitchen oxybutynin (DITROPAN-XL) 10 MG 24 hr tablet Take 1 tablet (10 mg total) by mouth every evening. 90 tablet 1  . potassium citrate (UROCIT-K 10) 10 MEQ (1080 MG) SR tablet Take 2 tablets (20 mEq total) by mouth 2 (two) times daily. 180 tablet 1  . prazosin (MINIPRESS) 1 MG capsule Take 3 capsules (3 mg total) by mouth at bedtime. 90 capsule 1  . ranitidine (ZANTAC) 150 MG tablet Take 1 tablet (150 mg total) by mouth 2 (two) times daily. 180 tablet 3   No current facility-administered medications on file prior to visit.     Past Medical History:  Diagnosis Date  . ALLERGIC RHINITIS   . BIPOLAR DISORDER UNSPECIFIED    depression  . Chronic diastolic heart failure ([place])   . Depression   . FIBROMYALGIA   . GERD (gastroesophageal reflux disease)   . GOITER, MULTINODULAR   . History of gout    "haven't had it in several years" (02/08/2012)  . History of wrist fracture    rt wrist  . HYPERCHOLESTEROLEMIA   . HYPOTHYROIDISM   . Iron deficiency anemia   . Kidney stones    sees urologist @ [place]  . Morbid obesity ([place])   . Neuropathy due to secondary diabetes ([place])   . PONV (postoperative nausea and vomiting)   . PTSD (post-traumatic stress disorder)   . RBBB   . Sleep apnea 7/14   mild osa-did not need cpap -dr Gwenette Greet  . Type II diabetes mellitus ([place])   . Wears glasses     Past Surgical History:  Procedure Laterality Date  . ABDOMINAL HYSTERECTOMY  1976  . CARDIAC CATHETERIZATION  2001   sees Dr Peter Martinique  . CATARACT  EXTRACTION  2014  . CATARACT EXTRACTION, BILATERAL  02/2011   epps  . CHOLECYSTECTOMY  1985  . EXCISIONAL HEMORRHOIDECTOMY     "dr cut out in his office" (02/08/2012)  . INCISIONAL BREAST BIOPSY  2000   right  . JOINT REPLACEMENT     rt knee  . KNEE ARTHROSCOPY  08/2009   right  . KNEE ARTHROSCOPY WITH MEDIAL MENISECTOMY     left  . Left Cystoscopy   1990  . LEFT OOPHORECTOMY  1980  . Lithotripsy (L) Kidney  1997  . PARTIAL MASTECTOMY WITH NEEDLE LOCALIZATION Left 09/01/2012   Procedure: PARTIAL MASTECTOMY WITH NEEDLE LOCALIZATION;  Surgeon: Adin Hector, MD;  Location: [facility];  Service:  General;  Laterality: Left;  . Percitania stone removed (L) Kidney  1992  . Right nasal surgery  08/1988  . Right sinus removed  08/1989   tooth partial  . ROTATOR CUFF REPAIR  2013   right shoulder x 2  . SHOULDER ARTHROSCOPY WITH ROTATOR CUFF REPAIR AND SUBACROMIAL DECOMPRESSION Left 08/20/2013   Procedure: SHOULDER ARTHROSCOPY  AND SUBACROMIAL DECOMPRESSION;  Surgeon: Nita Sells, MD;  Location: [facility];  Service: Orthopedics;  Laterality: Left;  Left shoulder arthroscopy, debridement, subacromial decompression, distal clavical resection  . THYROIDECTOMY  04/22/2011   Procedure: THYROIDECTOMY;  Surgeon: Onnie Graham, MD;  Location: [facility];  Service: ENT;  Laterality: N/A;  TOTAL THYROIDECOTMY  . TOTAL KNEE ARTHROPLASTY  06/18/2011   Procedure: TOTAL KNEE ARTHROPLASTY;  Surgeon: Kerin Salen, MD;  Location: [facility];  Service: Orthopedics;  Laterality: Left;  DEPUY  . TOTAL KNEE ARTHROPLASTY  02/07/2012   Procedure: TOTAL KNEE ARTHROPLASTY;  Surgeon: Kerin Salen, MD;  Location: [facility];  Service: Orthopedics;  Laterality: Right;  . TUBAL LIGATION  1972    Social History   Social History  . Marital status: Married    Spouse name: N/A  . Number of children: 2  . Years of education: 14   Occupational History  . disability Retired   Social History Main Topics  .  Smoking status: Former Smoker    Packs/day: 1.00    Years: 6.00    Types: Cigarettes    Quit date: 04/06/1983  . Smokeless tobacco: Never Used     Comment: Married, lives with spouse. Disable- 2 grown kids-6 g-kids  . Alcohol use No     Comment: none  . Drug use: No  . Sexual activity: Not Currently   Other Topics Concern  . None   Social History Narrative   Married, lives with spouse - 2 adult children   Disabled      No regular exercise    Family History  Problem Relation Age of Onset  . Hypertension Mother   . Lung cancer Mother     non-smoker  . Diabetes Father   . Hypertension Father   . Hyperlipidemia Father   . Heart attack Other   . Coronary artery disease Other   . Hypertension Sister   . Hypertension Brother   . Hypertension Brother   . Hypertension Brother   . Hypertension Sister   . Hypertension Sister   . Diabetes Daughter     Review of Systems  Constitutional: Negative for fatigue and fever.  HENT: Positive for ear pain (right).   Respiratory: Negative for cough, shortness of breath and wheezing.   Cardiovascular: Negative for chest pain, palpitations and leg swelling.  Gastrointestinal:       Gerd  Musculoskeletal: Positive for arthralgias (b/l knee pain) and gait problem.  Neurological: Positive for headaches (headaches and migraines). Negative for dizziness and light-headedness.  Psychiatric/Behavioral: Positive for dysphoric mood. Negative for self-injury and suicidal ideas. The patient is nervous/anxious.        Objective:   Vitals:   12/15/15 0857  BP: 114/60  Pulse: 97  Resp: 16  Temp: 97.6 F (36.4 C)   Filed Weights   12/15/15 0857  Weight: 272 lb (123.4 kg)   Body mass index is 47.43 kg/m.   Physical Exam    Constitutional: Appears well-developed and well-nourished. No distress.  HENT:  Head: Normocephalic and atraumatic.  Neck: Neck supple. No tracheal deviation present. No  thyromegaly present.  Cardiovascular: Normal  rate, regular rhythm and normal heart sounds.   No murmur heard. No carotid bruit  Pulmonary/Chest: Effort normal and breath sounds normal. No respiratory distress. No has no wheezes. No rales.  Musculoskeletal: No edema.  Lymphadenopathy: No cervical adenopathy.  Skin: Skin is warm and dry. Not diaphoretic.  Psychiatric: Normal mood and affect. Behavior is normal.     Assessment & Plan:    Flu vaccine today  See Problem List for Assessment and Plan of chronic medical problems.   F/u 6 months

## 2015-12-15 NOTE — Assessment & Plan Note (Signed)
Check tsh  Titrate med dose if needed  

## 2015-12-15 NOTE — Assessment & Plan Note (Addendum)
Not controlled Start omeprazole 20 mg daily Zantac as needed Encouraged weight loss

## 2015-12-15 NOTE — Assessment & Plan Note (Signed)
Taking Estrace daily Discussed tapering her off the medication slowly Advised her to start taking half a pill alternating with 1 full pill and then slowly get a half a pill daily Call us to taper off slowly so that she is asymptomatic

## 2015-12-22 ENCOUNTER — Telehealth: Payer: Self-pay | Admitting: Endocrinology

## 2015-12-22 MED ORDER — INSULIN SYRINGE 31G X 5/16" 1 ML MISC
2 refills | Status: DC
Start: 2015-12-22 — End: 2021-02-04

## 2015-12-22 MED ORDER — GLUCOSE BLOOD VI STRP: ORAL_STRIP | 2 refills | Status: DC

## 2015-12-22 MED ORDER — ACCU-CHEK AVIVA PLUS W/DEVICE KIT: PACK | 2 refills | Status: DC

## 2015-12-22 MED ORDER — ACCU-CHEK MULTICLIX LANCETS MISC: 2 refills | Status: DC

## 2015-12-22 NOTE — Telephone Encounter (Signed)
Refills submitted.  

## 2015-12-22 NOTE — Telephone Encounter (Signed)
[facility] needs a 90 day supply of the accu check meter test strips and lancets and the meter itself  Pt also needs the insulin syringes for her relion

## 2015-12-23 ENCOUNTER — Other Ambulatory Visit: Payer: Self-pay

## 2015-12-23 ENCOUNTER — Other Ambulatory Visit: Payer: Self-pay | Admitting: *Deleted

## 2015-12-23 MED ORDER — OXYBUTYNIN CHLORIDE ER 10 MG PO TB24: 10.0000 mg | ORAL_TABLET | Freq: Every evening | ORAL | 1 refills | Status: DC

## 2015-12-24 ENCOUNTER — Other Ambulatory Visit: Payer: Self-pay | Admitting: Emergency Medicine

## 2015-12-24 MED ORDER — HYDROCHLOROTHIAZIDE 25 MG PO TABS: 25.0000 mg | ORAL_TABLET | ORAL | 3 refills | Status: DC

## 2015-12-24 MED ORDER — LAMOTRIGINE 200 MG PO TABS: 200.0000 mg | ORAL_TABLET | Freq: Every day | ORAL | 1 refills | Status: DC

## 2015-12-24 MED ORDER — OXYBUTYNIN CHLORIDE ER 10 MG PO TB24: 10.0000 mg | ORAL_TABLET | Freq: Every evening | ORAL | 3 refills | Status: DC

## 2015-12-24 MED ORDER — LEVOTHYROXINE SODIUM 50 MCG PO TABS: 50.0000 ug | ORAL_TABLET | Freq: Every day | ORAL | 1 refills | Status: DC

## 2015-12-24 MED ORDER — OMEPRAZOLE 20 MG PO CPDR: 20.0000 mg | DELAYED_RELEASE_CAPSULE | Freq: Every day | ORAL | 3 refills | Status: DC

## 2015-12-24 MED ORDER — GABAPENTIN 300 MG PO CAPS: 600.0000 mg | ORAL_CAPSULE | Freq: Three times a day (TID) | ORAL | 1 refills | Status: DC

## 2015-12-24 MED ORDER — MELOXICAM 15 MG PO TABS: 15.0000 mg | ORAL_TABLET | Freq: Every day | ORAL | 0 refills | Status: DC

## 2015-12-24 MED ORDER — DOXEPIN HCL 100 MG PO CAPS: 100.0000 mg | ORAL_CAPSULE | Freq: Every day | ORAL | 1 refills | Status: DC

## 2015-12-24 MED ORDER — FUROSEMIDE 40 MG PO TABS: 40.0000 mg | ORAL_TABLET | Freq: Every day | ORAL | 3 refills | Status: DC

## 2015-12-24 MED ORDER — PRAZOSIN HCL 1 MG PO CAPS: 3.0000 mg | ORAL_CAPSULE | Freq: Every day | ORAL | 1 refills | Status: DC

## 2015-12-24 MED ORDER — ESTRADIOL 0.5 MG PO TABS: 0.5000 mg | ORAL_TABLET | Freq: Every day | ORAL | 1 refills | Status: DC

## 2015-12-24 MED ORDER — RANITIDINE HCL 150 MG PO TABS: 150.0000 mg | ORAL_TABLET | Freq: Two times a day (BID) | ORAL | 3 refills | Status: DC

## 2015-12-24 MED ORDER — LISINOPRIL 10 MG PO TABS: 10.0000 mg | ORAL_TABLET | Freq: Every day | ORAL | 3 refills | Status: DC

## 2015-12-24 MED ORDER — POTASSIUM CITRATE ER 10 MEQ (1080 MG) PO TBCR: 20.0000 meq | EXTENDED_RELEASE_TABLET | Freq: Two times a day (BID) | ORAL | 1 refills | Status: DC

## 2016-01-06 ENCOUNTER — Other Ambulatory Visit: Payer: Self-pay | Admitting: *Deleted

## 2016-01-06 MED ORDER — LAMOTRIGINE 200 MG PO TABS: 200.0000 mg | ORAL_TABLET | Freq: Every day | ORAL | 3 refills | Status: DC

## 2016-01-06 MED ORDER — RANITIDINE HCL 150 MG PO TABS: 150.0000 mg | ORAL_TABLET | Freq: Two times a day (BID) | ORAL | 3 refills | Status: DC

## 2016-01-06 MED ORDER — PRAZOSIN HCL 1 MG PO CAPS: 3.0000 mg | ORAL_CAPSULE | Freq: Every day | ORAL | 3 refills | Status: DC

## 2016-01-06 MED ORDER — ESTRADIOL 0.5 MG PO TABS: 0.5000 mg | ORAL_TABLET | Freq: Every day | ORAL | 3 refills | Status: DC

## 2016-01-06 MED ORDER — LISINOPRIL 10 MG PO TABS: 10.0000 mg | ORAL_TABLET | Freq: Every day | ORAL | 3 refills | Status: DC

## 2016-01-08 ENCOUNTER — Other Ambulatory Visit: Payer: Self-pay

## 2016-01-08 MED ORDER — GLUCOSE BLOOD VI STRP: ORAL_STRIP | 2 refills | Status: DC

## 2016-01-19 DIAGNOSIS — D2272 Melanocytic nevi of left lower limb, including hip: Secondary | ICD-10-CM | POA: Diagnosis not present

## 2016-01-19 DIAGNOSIS — D2261 Melanocytic nevi of right upper limb, including shoulder: Secondary | ICD-10-CM | POA: Diagnosis not present

## 2016-01-19 DIAGNOSIS — D225 Melanocytic nevi of trunk: Secondary | ICD-10-CM | POA: Diagnosis not present

## 2016-01-19 DIAGNOSIS — L821 Other seborrheic keratosis: Secondary | ICD-10-CM | POA: Diagnosis not present

## 2016-01-19 DIAGNOSIS — D2262 Melanocytic nevi of left upper limb, including shoulder: Secondary | ICD-10-CM | POA: Diagnosis not present

## 2016-01-19 DIAGNOSIS — D2271 Melanocytic nevi of right lower limb, including hip: Secondary | ICD-10-CM | POA: Diagnosis not present

## 2016-01-19 DIAGNOSIS — D1801 Hemangioma of skin and subcutaneous tissue: Secondary | ICD-10-CM | POA: Diagnosis not present

## 2016-02-15 NOTE — Progress Notes (Signed)
Subjective:    Patient ID: Brittany Acosta, female    DOB: 03-01-1951, 65 y.o.   MRN: [id number]  HPI Pt returns for f/u of diabetes mellitus: DM type: Insulin-requiring type 2 Dx'ed: 8250 Complications: polyneuropathy.   Therapy: insulin since 2004 GDM: never.  DKA: never. Severe hypoglycemia: never.  Pancreatitis: never.  Other: in 2012, she was changed to a BID insulin regimen, after poor results with multiple daily injections.  She cannot afford insulin analogs.  she declines weight-loss surgery; she was told she had bilat charcot feet, but ortho said no.   Interval history: she takes NPH, 65 units QAM and 25 units QPM.  She has mild hypoglycemia at any time of day approx qod, usually in the afternoon.  she brings a record of her cbg's which i have reviewed today.  It varies from 70-200.  It is lowest in the afternoon.   Past Medical History:  Diagnosis Date  . ALLERGIC RHINITIS   . BIPOLAR DISORDER UNSPECIFIED    depression  . Chronic diastolic heart failure ([place])   . Depression   . FIBROMYALGIA   . GERD (gastroesophageal reflux disease)   . GOITER, MULTINODULAR   . History of gout    "haven't had it in several years" (02/08/2012)  . History of wrist fracture    rt wrist  . HYPERCHOLESTEROLEMIA   . HYPOTHYROIDISM   . Iron deficiency anemia   . Kidney stones    sees urologist @ [facility]  . Morbid obesity ([place])   . Neuropathy due to secondary diabetes ([place])   . PONV (postoperative nausea and vomiting)   . PTSD (post-traumatic stress disorder)   . RBBB   . Sleep apnea 7/14   mild osa-did not need cpap -dr Gwenette Greet  . Type II diabetes mellitus ([place])   . Wears glasses     Past Surgical History:  Procedure Laterality Date  . ABDOMINAL HYSTERECTOMY  1976  . CARDIAC CATHETERIZATION  2001   sees Dr Peter Martinique  . CATARACT EXTRACTION  2014  . CATARACT EXTRACTION, BILATERAL  02/2011   epps  . CHOLECYSTECTOMY  1985  . EXCISIONAL HEMORRHOIDECTOMY     "dr cut out in his  office" (02/08/2012)  . INCISIONAL BREAST BIOPSY  2000   right  . JOINT REPLACEMENT     rt knee  . KNEE ARTHROSCOPY  08/2009   right  . KNEE ARTHROSCOPY WITH MEDIAL MENISECTOMY     left  . Left Cystoscopy   1990  . LEFT OOPHORECTOMY  1980  . Lithotripsy (L) Kidney  1997  . PARTIAL MASTECTOMY WITH NEEDLE LOCALIZATION Left 09/01/2012   Procedure: PARTIAL MASTECTOMY WITH NEEDLE LOCALIZATION;  Surgeon: Adin Hector, MD;  Location: [facility];  Service: General;  Laterality: Left;  . Percitania stone removed (L) Kidney  1992  . Right nasal surgery  08/1988  . Right sinus removed  08/1989   tooth partial  . ROTATOR CUFF REPAIR  2013   right shoulder x 2  . SHOULDER ARTHROSCOPY WITH ROTATOR CUFF REPAIR AND SUBACROMIAL DECOMPRESSION Left 08/20/2013   Procedure: SHOULDER ARTHROSCOPY  AND SUBACROMIAL DECOMPRESSION;  Surgeon: Nita Sells, MD;  Location: [facility];  Service: Orthopedics;  Laterality: Left;  Left shoulder arthroscopy, debridement, subacromial decompression, distal clavical resection  . THYROIDECTOMY  04/22/2011   Procedure: THYROIDECTOMY;  Surgeon: Onnie Graham, MD;  Location: [facility];  Service: ENT;  Laterality: N/A;  TOTAL THYROIDECOTMY  . TOTAL KNEE ARTHROPLASTY  06/18/2011  Procedure: TOTAL KNEE ARTHROPLASTY;  Surgeon: Kerin Salen, MD;  Location: [facility];  Service: Orthopedics;  Laterality: Left;  DEPUY  . TOTAL KNEE ARTHROPLASTY  02/07/2012   Procedure: TOTAL KNEE ARTHROPLASTY;  Surgeon: Kerin Salen, MD;  Location: [facility];  Service: Orthopedics;  Laterality: Right;  . TUBAL LIGATION  1972    Social History   Social History  . Marital status: Married    Spouse name: N/A  . Number of children: 2  . Years of education: 14   Occupational History  . disability Retired   Social History Main Topics  . Smoking status: Former Smoker    Packs/day: 1.00    Years: 6.00    Types: Cigarettes    Quit date: 04/06/1983  . Smokeless tobacco: Never Used      Comment: Married, lives with spouse. Disable- 2 grown kids-6 g-kids  . Alcohol use No     Comment: none  . Drug use: No  . Sexual activity: Not Currently   Other Topics Concern  . Not on file   Social History Narrative   Married, lives with spouse - 2 adult children   Disabled      No regular exercise    Current Outpatient Prescriptions on File Prior to Visit  Medication Sig Dispense Refill  . Biotin 10 MG CAPS Take 1 capsule by mouth daily.    . Blood Glucose Monitoring Suppl (ACCU-CHEK AVIVA PLUS) w/Device KIT Use to check blood sugar 2 times per day. 1 kit 2  . Coenzyme Q10 120 MG CAPS Take 120 mg by mouth daily.    Marland Kitchen doxepin (SINEQUAN) 100 MG capsule Take 1 capsule (100 mg total) by mouth at bedtime. 90 capsule 1  . estradiol (ESTRACE) 0.5 MG tablet Take 1 tablet (0.5 mg total) by mouth daily. 90 tablet 3  . furosemide (LASIX) 40 MG tablet Take 1 tablet (40 mg total) by mouth daily. 90 tablet 3  . gabapentin (NEURONTIN) 300 MG capsule Take 2 capsules (600 mg total) by mouth 3 (three) times daily. 540 capsule 1  . glucose blood (ACCU-CHEK AVIVA) test strip Use to check blood sugar two times per day. 200 each 2  . hydrochlorothiazide (HYDRODIURIL) 25 MG tablet Take 1 tablet (25 mg total) by mouth every morning. 90 tablet 3  . Insulin Pen Needle (B-D UF III MINI PEN NEEDLES) 31G X 5 MM MISC Use pen needles with kwik to inject insulin into skin twice a day. 180 each 3  . Insulin Syringe-Needle U-100 (INSULIN SYRINGE 1CC/31GX5/16") 31G X 5/16" 1 ML MISC Use to inject insulin two times per day. 200 each 2  . lamoTRIgine (LAMICTAL) 200 MG tablet Take 1 tablet (200 mg total) by mouth daily at 12 noon. 90 tablet 3  . Lancets (ACCU-CHEK MULTICLIX) lancets Use to check blood sugar two time per day. 200 each 2  . levothyroxine (SYNTHROID) 50 MCG tablet Take 1 tablet (50 mcg total) by mouth daily. 90 tablet 1  . lisinopril (PRINIVIL,ZESTRIL) 10 MG tablet Take 1 tablet (10 mg total) by mouth  daily. 90 tablet 3  . meloxicam (MOBIC) 15 MG tablet Take 1 tablet (15 mg total) by mouth daily. 90 tablet 0  . Multiple Vitamin (MULTIVITAMIN WITH MINERALS) TABS Take 1 tablet by mouth every evening.    Marland Kitchen omeprazole (PRILOSEC) 20 MG capsule Take 1 capsule (20 mg total) by mouth daily. Take 30 minutes before a meal 90 capsule 3  . oxybutynin (DITROPAN-XL) 10 MG  24 hr tablet Take 1 tablet (10 mg total) by mouth every evening. 90 tablet 3  . potassium citrate (UROCIT-K 10) 10 MEQ (1080 MG) SR tablet Take 2 tablets (20 mEq total) by mouth 2 (two) times daily. 180 tablet 1  . prazosin (MINIPRESS) 1 MG capsule Take 3 capsules (3 mg total) by mouth at bedtime. 270 capsule 3  . ranitidine (ZANTAC) 150 MG tablet Take 1 tablet (150 mg total) by mouth 2 (two) times daily. 180 tablet 3   No current facility-administered medications on file prior to visit.     No Known Allergies  Family History  Problem Relation Age of Onset  . Hypertension Mother   . Lung cancer Mother     non-smoker  . Diabetes Father   . Hypertension Father   . Hyperlipidemia Father   . Heart attack Other   . Coronary artery disease Other   . Hypertension Sister   . Hypertension Brother   . Hypertension Brother   . Hypertension Brother   . Hypertension Sister   . Hypertension Sister   . Diabetes Daughter     BP 134/86   Pulse 74   Wt 266 lb (120.7 kg)   SpO2 94%   BMI 46.38 kg/m    Review of Systems She denies hypoglycemia.      Objective:   Physical Exam VITAL SIGNS:  See vs page GENERAL: no distress Pulses: dorsalis pedis intact bilat.   MSK: no deformity of the feet.   CV: no leg edema.   Skin:  no ulcer on the feet.  normal color and temp on the feet.  Neuro: sensation is intact to touch on the feet, but severely decreased from normal.  Ext: There is bilateral onychomycosis of the toenails.     A1c=6.6%    Assessment & Plan:  Insulin-requiring type 2 DM, with polyneuropathy: overcontrolled, given  this regimen, which does match insulin to her changing needs throughout the day.

## 2016-02-16 ENCOUNTER — Ambulatory Visit: Payer: Commercial Managed Care - HMO | Admitting: Nurse Practitioner

## 2016-02-16 ENCOUNTER — Emergency Department (HOSPITAL_COMMUNITY)
Admission: EM | Admit: 2016-02-16 | Discharge: 2016-02-16 | Disposition: A | Discharge status: Home / Self Care | Payer: Commercial Managed Care - HMO | Attending: Emergency Medicine | Admitting: Emergency Medicine

## 2016-02-16 ENCOUNTER — Ambulatory Visit (INDEPENDENT_AMBULATORY_CARE_PROVIDER_SITE_OTHER): Payer: Commercial Managed Care - HMO | Admitting: Endocrinology

## 2016-02-16 ENCOUNTER — Emergency Department (HOSPITAL_BASED_OUTPATIENT_CLINIC_OR_DEPARTMENT_OTHER): Payer: Commercial Managed Care - HMO

## 2016-02-16 ENCOUNTER — Encounter: Payer: Self-pay | Admitting: Endocrinology

## 2016-02-16 ENCOUNTER — Encounter (HOSPITAL_COMMUNITY): Payer: Self-pay | Admitting: Emergency Medicine

## 2016-02-16 VITALS — BP 134/86 | HR 74 | Wt 266.0 lb

## 2016-02-16 DIAGNOSIS — I5032 Chronic diastolic (congestive) heart failure: Secondary | ICD-10-CM | POA: Diagnosis not present

## 2016-02-16 DIAGNOSIS — M79609 Pain in unspecified limb: Secondary | ICD-10-CM | POA: Diagnosis not present

## 2016-02-16 DIAGNOSIS — E039 Hypothyroidism, unspecified: Secondary | ICD-10-CM | POA: Insufficient documentation

## 2016-02-16 DIAGNOSIS — E1140 Type 2 diabetes mellitus with diabetic neuropathy, unspecified: Secondary | ICD-10-CM | POA: Insufficient documentation

## 2016-02-16 DIAGNOSIS — M79662 Pain in left lower leg: Secondary | ICD-10-CM | POA: Diagnosis not present

## 2016-02-16 DIAGNOSIS — Z794 Long term (current) use of insulin: Secondary | ICD-10-CM | POA: Insufficient documentation

## 2016-02-16 DIAGNOSIS — Z96653 Presence of artificial knee joint, bilateral: Secondary | ICD-10-CM | POA: Diagnosis not present

## 2016-02-16 DIAGNOSIS — Z79899 Other long term (current) drug therapy: Secondary | ICD-10-CM | POA: Diagnosis not present

## 2016-02-16 DIAGNOSIS — Z87891 Personal history of nicotine dependence: Secondary | ICD-10-CM | POA: Insufficient documentation

## 2016-02-16 DIAGNOSIS — M79605 Pain in left leg: Secondary | ICD-10-CM | POA: Diagnosis not present

## 2016-02-16 DIAGNOSIS — G629 Polyneuropathy, unspecified: Secondary | ICD-10-CM | POA: Diagnosis not present

## 2016-02-16 DIAGNOSIS — M79661 Pain in right lower leg: Secondary | ICD-10-CM | POA: Diagnosis not present

## 2016-02-16 DIAGNOSIS — E1142 Type 2 diabetes mellitus with diabetic polyneuropathy: Secondary | ICD-10-CM | POA: Diagnosis not present

## 2016-02-16 DIAGNOSIS — I110 Hypertensive heart disease with heart failure: Secondary | ICD-10-CM | POA: Diagnosis not present

## 2016-02-16 DIAGNOSIS — R200 Anesthesia of skin: Secondary | ICD-10-CM | POA: Diagnosis present

## 2016-02-16 LAB — POCT GLYCOSYLATED HEMOGLOBIN (HGB A1C): Hemoglobin A1C: 6.6

## 2016-02-16 MED ORDER — OXYCODONE-ACETAMINOPHEN 5-325 MG PO TABS
1.0000 | ORAL_TABLET | Freq: Once | ORAL | Status: AC
  Administered 2016-02-16: 1 via ORAL
  Filled 2016-02-16: qty 1

## 2016-02-16 MED ORDER — INSULIN NPH (HUMAN) (ISOPHANE) 100 UNIT/ML ~~LOC~~ SUSP: SUBCUTANEOUS | 11 refills | Status: DC

## 2016-02-16 MED ORDER — HYDROCODONE-ACETAMINOPHEN 5-325 MG PO TABS: 1.0000 | ORAL_TABLET | ORAL | 0 refills | Status: DC | PRN

## 2016-02-16 MED ORDER — IBUPROFEN 400 MG PO TABS
600.0000 mg | ORAL_TABLET | Freq: Once | ORAL | Status: AC
  Administered 2016-02-16: 600 mg via ORAL
  Filled 2016-02-16: qty 1

## 2016-02-16 NOTE — ED Notes (Signed)
Pt states she understands instructions. Home stable with husband. All questions answered.

## 2016-02-16 NOTE — Patient Instructions (Addendum)
Please reduce the insulin to 60 units each morning, and 25 units in the evening. check your blood sugar twice a day.  vary the time of day when you check, between before the 3 meals, and at bedtime.  also check if you have symptoms of your blood sugar being too high or too low.  please keep a record of the readings and bring it to your next appointment here (or you can bring the meter itself).  You can write it on any piece of paper.  please call us sooner if your blood sugar goes below 70, or if you have a lot of readings over 200.   Please come back for a follow-up appointment in 4 months.

## 2016-02-16 NOTE — Progress Notes (Signed)
*  Preliminary Results* Left lower extremity venous duplex completed. Left lower extremity is negative for deep vein thrombosis. There is no evidence of left Baker's cyst.  02/16/2016 1:07 PM  Maudry Mayhew, BS, RVT, RDCS, RDMS

## 2016-02-16 NOTE — Addendum Note (Signed)
Addended by: Verlin Grills T on: 02/16/2016 09:30 AM   Modules accepted: Orders

## 2016-02-16 NOTE — ED Triage Notes (Signed)
Pt sts increasing numbness to feet x 5 days and pain in left leg

## 2016-02-17 NOTE — ED Provider Notes (Signed)
[facility] DEPT Provider Note   CSN: [id number] Arrival date & time: 02/16/16  1016     History   Chief Complaint Chief Complaint  Patient presents with  . Numbness  . Leg Pain    HPI Brittany Acosta is a 65 y.o. female.  HPI Patient presents to department complains of increasing numbness and pain to her bilateral lower extremities over the past 5 days.  She reports increasing pain to her left lower extremity including discomfort in the left groin.  No fevers or chills.  No discoloration.  She's never had a DVT or pulmonary embolism.  She has no neuropathy but feels as though she is having exacerbation of her neuropathy at this time.  She is on gabapentin   Past Medical History:  Diagnosis Date  . ALLERGIC RHINITIS   . BIPOLAR DISORDER UNSPECIFIED    depression  . Chronic diastolic heart failure ([place])   . Depression   . FIBROMYALGIA   . GERD (gastroesophageal reflux disease)   . GOITER, MULTINODULAR   . History of gout    "haven't had it in several years" (02/08/2012)  . History of wrist fracture    rt wrist  . HYPERCHOLESTEROLEMIA   . HYPOTHYROIDISM   . Iron deficiency anemia   . Kidney stones    sees urologist @ [facility]  . Morbid obesity ([place])   . Neuropathy due to secondary diabetes ([place])   . PONV (postoperative nausea and vomiting)   . PTSD (post-traumatic stress disorder)   . RBBB   . Sleep apnea 7/14   mild osa-did not need cpap -dr Gwenette Greet  . Type II diabetes mellitus ([place])   . Wears glasses     Patient Active Problem List   Diagnosis Date Noted  . Diabetes ([place]) 06/10/2015  . Family history of early CAD 04/18/2015  . Right bundle branch block 04/18/2015  . Bifascicular block 04/18/2015  . Tailbone injury 11/05/2014  . Lumbar radiculopathy 05/31/2014  . Greater trochanteric bursitis of left hip 04/22/2014  . Achilles tendinosis 02/19/2014  . Gait disorder 11/14/2013  . Recurrent falls 11/14/2013  . Multiple fractures 11/14/2013  . Pain in joint,  ankle and foot 07/02/2013  . Peripheral neuropathy ([place]) 09/13/2012  . Migraine 07/10/2012  . Osteoarthritis of right knee 02/10/2012  . Osteoarthritis of left knee 06/21/2011  . GERD (gastroesophageal reflux disease) 02/09/2011  . Unspecified vitamin D deficiency 08/27/2010  . FIBROMYALGIA 03/23/2010  . GOITER, MULTINODULAR 03/24/2009  . Edema 09/06/2008  . Morbid obesity ([place]) 09/03/2008  . Hypothyroidism 06/10/2008  . HYPERCHOLESTEROLEMIA 06/10/2008  . BIPOLAR DISORDER UNSPECIFIED 06/10/2008  . Essential hypertension 06/10/2008  . ALLERGIC RHINITIS 06/10/2008  . Asymptomatic postmenopausal status 06/10/2008    Past Surgical History:  Procedure Laterality Date  . ABDOMINAL HYSTERECTOMY  1976  . CARDIAC CATHETERIZATION  2001   sees Dr Peter Martinique  . CATARACT EXTRACTION  2014  . CATARACT EXTRACTION, BILATERAL  02/2011   epps  . CHOLECYSTECTOMY  1985  . EXCISIONAL HEMORRHOIDECTOMY     "dr cut out in his office" (02/08/2012)  . INCISIONAL BREAST BIOPSY  2000   right  . JOINT REPLACEMENT     rt knee  . KNEE ARTHROSCOPY  08/2009   right  . KNEE ARTHROSCOPY WITH MEDIAL MENISECTOMY     left  . Left Cystoscopy   1990  . LEFT OOPHORECTOMY  1980  . Lithotripsy (L) Kidney  1997  . PARTIAL MASTECTOMY WITH NEEDLE LOCALIZATION Left 09/01/2012   Procedure:  PARTIAL MASTECTOMY WITH NEEDLE LOCALIZATION;  Surgeon: Adin Hector, MD;  Location: [facility];  Service: General;  Laterality: Left;  . Percitania stone removed (L) Kidney  1992  . Right nasal surgery  08/1988  . Right sinus removed  08/1989   tooth partial  . ROTATOR CUFF REPAIR  2013   right shoulder x 2  . SHOULDER ARTHROSCOPY WITH ROTATOR CUFF REPAIR AND SUBACROMIAL DECOMPRESSION Left 08/20/2013   Procedure: SHOULDER ARTHROSCOPY  AND SUBACROMIAL DECOMPRESSION;  Surgeon: Nita Sells, MD;  Location: [facility];  Service: Orthopedics;  Laterality: Left;  Left shoulder arthroscopy, debridement, subacromial  decompression, distal clavical resection  . THYROIDECTOMY  04/22/2011   Procedure: THYROIDECTOMY;  Surgeon: Onnie Graham, MD;  Location: [facility];  Service: ENT;  Laterality: N/A;  TOTAL THYROIDECOTMY  . TOTAL KNEE ARTHROPLASTY  06/18/2011   Procedure: TOTAL KNEE ARTHROPLASTY;  Surgeon: Kerin Salen, MD;  Location: [facility];  Service: Orthopedics;  Laterality: Left;  DEPUY  . TOTAL KNEE ARTHROPLASTY  02/07/2012   Procedure: TOTAL KNEE ARTHROPLASTY;  Surgeon: Kerin Salen, MD;  Location: [facility];  Service: Orthopedics;  Laterality: Right;  . TUBAL LIGATION  1972    OB History    No data available       Home Medications    Prior to Admission medications   Medication Sig Start Date End Date Taking? Authorizing Provider  Biotin 10 MG CAPS Take 1 capsule by mouth daily.    Historical Provider, MD  Blood Glucose Monitoring Suppl (ACCU-CHEK AVIVA PLUS) w/Device KIT Use to check blood sugar 2 times per day. 12/22/15   Renato Shin, MD  Coenzyme Q10 120 MG CAPS Take 120 mg by mouth daily.    Historical Provider, MD  doxepin (SINEQUAN) 100 MG capsule Take 1 capsule (100 mg total) by mouth at bedtime. 12/24/15   [name], MD  estradiol (ESTRACE) 0.5 MG tablet Take 1 tablet (0.5 mg total) by mouth daily. 01/06/16   [name], MD  furosemide (LASIX) 40 MG tablet Take 1 tablet (40 mg total) by mouth daily. 12/24/15   [name], MD  gabapentin (NEURONTIN) 300 MG capsule Take 2 capsules (600 mg total) by mouth 3 (three) times daily. 12/24/15   [name], MD  glucose blood (ACCU-CHEK AVIVA) test strip Use to check blood sugar two times per day. 01/08/16   Renato Shin, MD  hydrochlorothiazide (HYDRODIURIL) 25 MG tablet Take 1 tablet (25 mg total) by mouth every morning. 12/24/15   [name], MD  HYDROcodone-acetaminophen (NORCO/VICODIN) 5-325 MG tablet Take 1 tablet by mouth every 4 (four) hours as needed for moderate pain. 02/16/16   Jola Schmidt, MD  insulin NPH Human (NOVOLIN N) 100 UNIT/ML  injection 60 units each morning, and 25 units each evening, and syringes 1/day 02/16/16   Renato Shin, MD  Insulin Pen Needle (B-D UF III MINI PEN NEEDLES) 31G X 5 MM MISC Use pen needles with kwik to inject insulin into skin twice a day. 07/22/15   Renato Shin, MD  Insulin Syringe-Needle U-100 (INSULIN SYRINGE 1CC/31GX5/16") 31G X 5/16" 1 ML MISC Use to inject insulin two times per day. 12/22/15   Renato Shin, MD  lamoTRIgine (LAMICTAL) 200 MG tablet Take 1 tablet (200 mg total) by mouth daily at 12 noon. 01/06/16   [name], MD  Lancets (ACCU-CHEK MULTICLIX) lancets Use to check blood sugar two time per day. 12/22/15   Renato Shin, MD  levothyroxine (SYNTHROID) 50 MCG tablet Take 1 tablet (50 mcg total) by mouth daily. 12/24/15   [name], MD  lisinopril (PRINIVIL,ZESTRIL) 10 MG tablet Take 1 tablet (10 mg total) by mouth daily. 01/06/16   [name], MD  Melatonin 10 MG CAPS Take by mouth.    Historical Provider, MD  meloxicam (MOBIC) 15 MG tablet Take 1 tablet (15 mg total) by mouth daily. 12/24/15   [name], MD  Multiple Vitamin (MULTIVITAMIN WITH MINERALS) TABS Take 1 tablet by mouth every evening.    Historical Provider, MD  omeprazole (PRILOSEC) 20 MG capsule Take 1 capsule (20 mg total) by mouth daily. Take 30 minutes before a meal 12/24/15   [name], MD  oxybutynin (DITROPAN-XL) 10 MG 24 hr tablet Take 1 tablet (10 mg total) by mouth every evening. 12/24/15   [name], MD  potassium citrate (UROCIT-K 10) 10 MEQ (1080 MG) SR tablet Take 2 tablets (20 mEq total) by mouth 2 (two) times daily. 12/24/15   [name], MD  prazosin (MINIPRESS) 1 MG capsule Take 3 capsules (3 mg total) by mouth at bedtime. 01/06/16   [name], MD  ranitidine (ZANTAC) 150 MG tablet Take 1 tablet (150 mg total) by mouth 2 (two) times daily. 01/06/16   [name], MD    Family History Family History  Problem Relation Age of Onset  . Hypertension Mother   . Lung cancer Mother      non-smoker  . Diabetes Father   . Hypertension Father   . Hyperlipidemia Father   . Heart attack Other   . Coronary artery disease Other   . Hypertension Sister   . Hypertension Brother   . Hypertension Brother   . Hypertension Brother   . Hypertension Sister   . Hypertension Sister   . Diabetes Daughter     Social History Social History  Substance Use Topics  . Smoking status: Former Smoker    Packs/day: 1.00    Years: 6.00    Types: Cigarettes    Quit date: 04/06/1983  . Smokeless tobacco: Never Used     Comment: Married, lives with spouse. Disable- 2 grown kids-6 g-kids  . Alcohol use No     Comment: none     Allergies   Patient has no known allergies.   Review of Systems Review of Systems  All other systems reviewed and are negative.    Physical Exam Updated Vital Signs BP 137/66 (BP Location: Right Arm)   Pulse 74   Temp 98 F (36.7 C)   Resp 18   Ht _0  (1.575 m)   Wt 266 lb (120.7 kg)   SpO2 97%   BMI 48.65 kg/m   Physical Exam  Constitutional: She is oriented to person, place, and time. She appears well-developed and well-nourished. No distress.  HENT:  Head: Normocephalic and atraumatic.  Eyes: EOM are normal.  Neck: Normal range of motion.  Cardiovascular: Normal rate and regular rhythm.   Pulmonary/Chest: Effort normal and breath sounds normal.  Abdominal: Soft. She exhibits no distension. There is no tenderness.  Musculoskeletal: Normal range of motion.  Normal PT and DP pulses bilaterally.  No significant swelling of her bilateral lower extremities.  No erythema or rash noted at the lower extremities.  No obvious lymphadenopathy noted in the left groin.  Full range of motion bilateral hips, knees, ankles.  Neurological: She is alert and oriented to person, place, and time.  Skin: Skin is warm and dry.  Psychiatric: She has a normal mood and affect. Judgment normal.  Nursing note and vitals reviewed.    ED Treatments / Results   Labs (all labs ordered are listed, but only abnormal results are displayed) Labs Reviewed - No data to display  EKG  EKG Interpretation None       Radiology No results found.  Procedures Procedures (including critical care time)  Medications Ordered in ED Medications  ibuprofen (ADVIL,MOTRIN) tablet 600 mg (600 mg Oral Given 02/16/16 1244)  oxyCODONE-acetaminophen (PERCOCET/ROXICET) 5-325 MG per tablet 1 tablet (1 tablet Oral Given 02/16/16 1244)  oxyCODONE-acetaminophen (PERCOCET/ROXICET) 5-325 MG per tablet 1 tablet (1 tablet Oral Given 02/16/16 1436)     Initial Impression / Assessment and Plan / ED Course  I have reviewed the triage vital signs and the nursing notes.  Pertinent labs & imaging results that were available during my care of the patient were reviewed by me and considered in my medical decision making (see chart for details).  Clinical Course     Duplex of her left lower extremity demonstrates no DVT.  She has normal arterial flow to both of her lower extremities.  There is no signs to suggest infection.  I suspect this is exacerbation of her neuropathic pain.  Short course of hydrocodone.  Final Clinical Impressions(s) / ED Diagnoses   Final diagnoses:  Neuropathy [facility])    New Prescriptions Discharge Medication List as of 02/16/2016  2:22 PM    START taking these medications   Details  HYDROcodone-acetaminophen (NORCO/VICODIN) 5-325 MG tablet Take 1 tablet by mouth every 4 (four) hours as needed for moderate pain., Starting Mon 02/16/2016, Print         Jola Schmidt, MD 02/17/16 [id number]

## 2016-02-19 ENCOUNTER — Telehealth: Payer: Self-pay | Admitting: Emergency Medicine

## 2016-02-19 MED ORDER — SERTRALINE HCL 50 MG PO TABS
50.0000 mg | ORAL_TABLET | Freq: Every day | ORAL | 3 refills | Status: DC
Start: 2016-02-19 — End: 2020-01-15

## 2016-02-19 NOTE — Telephone Encounter (Signed)
We will start sertraline 50 mg daily - sent to [facility].  Have her contact us after 3-4 weeks of taking it - we can increase to 100 mg if needed

## 2016-02-19 NOTE — Telephone Encounter (Signed)
Spoke with pt, she is okay with switching from Doxepin to Escitalopram, Fluvoxamine, Sertraline. Pt has taken Sertraline in the past and does not recall any side effects. Please send new RX to [facility] mail order.

## 2016-02-24 NOTE — Telephone Encounter (Signed)
LVM informing pt to call if she feels that the new RX is not working.

## 2016-02-27 ENCOUNTER — Other Ambulatory Visit: Payer: Self-pay | Admitting: Internal Medicine

## 2016-03-01 DIAGNOSIS — M21611 Bunion of right foot: Secondary | ICD-10-CM | POA: Diagnosis not present

## 2016-03-01 DIAGNOSIS — E1142 Type 2 diabetes mellitus with diabetic polyneuropathy: Secondary | ICD-10-CM | POA: Diagnosis not present

## 2016-03-01 DIAGNOSIS — L84 Corns and callosities: Secondary | ICD-10-CM | POA: Diagnosis not present

## 2016-03-01 DIAGNOSIS — L602 Onychogryphosis: Secondary | ICD-10-CM | POA: Diagnosis not present

## 2016-03-01 DIAGNOSIS — M21612 Bunion of left foot: Secondary | ICD-10-CM | POA: Diagnosis not present

## 2016-03-15 ENCOUNTER — Telehealth: Payer: Self-pay | Admitting: Internal Medicine

## 2016-03-15 NOTE — Telephone Encounter (Signed)
Patient is requesting a [facility] referral to the breast center for a routine mammogram

## 2016-03-22 NOTE — Telephone Encounter (Signed)
No [facility] referral needed for mammograms - pt aware

## 2016-04-03 DIAGNOSIS — J329 Chronic sinusitis, unspecified: Secondary | ICD-10-CM | POA: Diagnosis not present

## 2016-04-12 ENCOUNTER — Telehealth: Payer: Self-pay | Admitting: Internal Medicine

## 2016-04-12 NOTE — Telephone Encounter (Signed)
Pt called she says that she was seen and prescribed amoxicillin and a steroid. Pt says that she is out of both but is almost feeling better. Pt would like to know if provider could refill both and send to pharmacy.    Pt is currently out of town  Pharmacy: [facility] - [phone]   Address - [address] [place], [place] [address]

## 2016-04-12 NOTE — Telephone Encounter (Signed)
Not sure who prescribed it.   The antibiotic and steroids may still be working a little.  She will still have some residual symptoms.  It is not a good ideal to take another round of antibiotics if it is not needed - she should give it some time. If she really thinks she needs additional antibiotics/steroids she should come in.

## 2016-04-12 NOTE — Telephone Encounter (Signed)
Please advise 

## 2016-04-13 DIAGNOSIS — J019 Acute sinusitis, unspecified: Secondary | ICD-10-CM | POA: Diagnosis not present

## 2016-04-13 NOTE — Telephone Encounter (Signed)
Patient called back.  Gave MD response.  Patient states she is not going to make appt.  She is going to try to find another MD.

## 2016-04-13 NOTE — Telephone Encounter (Signed)
Noted  

## 2016-04-13 NOTE — Telephone Encounter (Signed)
LVM informing pt

## 2016-04-23 ENCOUNTER — Other Ambulatory Visit: Payer: Self-pay | Admitting: Internal Medicine

## 2016-04-29 DIAGNOSIS — M7062 Trochanteric bursitis, left hip: Secondary | ICD-10-CM | POA: Diagnosis not present

## 2016-04-30 DIAGNOSIS — H43393 Other vitreous opacities, bilateral: Secondary | ICD-10-CM | POA: Diagnosis not present

## 2016-04-30 DIAGNOSIS — E119 Type 2 diabetes mellitus without complications: Secondary | ICD-10-CM | POA: Diagnosis not present

## 2016-05-19 ENCOUNTER — Ambulatory Visit (INDEPENDENT_AMBULATORY_CARE_PROVIDER_SITE_OTHER): Payer: Medicare HMO | Admitting: Adult Health

## 2016-05-19 ENCOUNTER — Encounter: Payer: Self-pay | Admitting: Adult Health

## 2016-05-19 VITALS — BP 128/84 | Temp 97.6°F | Ht 62.0 in | Wt 265.6 lb

## 2016-05-19 DIAGNOSIS — M79605 Pain in left leg: Secondary | ICD-10-CM | POA: Diagnosis not present

## 2016-05-19 DIAGNOSIS — M79604 Pain in right leg: Secondary | ICD-10-CM

## 2016-05-19 NOTE — Patient Instructions (Signed)
It was great meeting you today   I will follow up with you regarding your blood work.   Your exam is consistent with a muscle strain.   It is ok to take tylenol for this.

## 2016-05-19 NOTE — Progress Notes (Signed)
Subjective:    Patient ID: Brittany Acosta, female    DOB: [birth year], 67 y.o.   MRN: [id number]  HPI  66 year old female, patient of Dr. Quay Burow, who I am seeing today for the first time. She  has a past medical history of ALLERGIC RHINITIS; BIPOLAR DISORDER UNSPECIFIED; Chronic diastolic heart failure ([place]); Depression; FIBROMYALGIA; GERD (gastroesophageal reflux disease); GOITER, MULTINODULAR; History of gout; History of wrist fracture; HYPERCHOLESTEROLEMIA; HYPOTHYROIDISM; Iron deficiency anemia; Kidney stones; Morbid obesity ([place]); Neuropathy due to secondary diabetes ([place]); PONV (postoperative nausea and vomiting); PTSD (post-traumatic stress disorder); RBBB; Sleep apnea (7/14); Type II diabetes mellitus ([place]); and Wears glasses.  Today in the office she reports that for the last 3-4 days she has been experiencing pain in bilateral lower extremities. The pain is described as sharp and is located just below her gluteal muscle and radiates down to her calfs.   She denies any redness, warmth, swelling, or discoloration in her lower extremities. Denies any trauma or event that would cause this discomfort. She also denies any long trips via car, bus, or plane.   Pain is worse with movements   Has not taken any medication for the pain   Reports that her urine has been darker in color but she is trying to drink more fluid  Review of Systems  Constitutional: Negative.   Respiratory: Negative.   Cardiovascular: Negative.  Negative for chest pain, palpitations and leg swelling.  Gastrointestinal: Negative.   Musculoskeletal: Positive for myalgias. Negative for arthralgias, back pain, gait problem and joint swelling.  Skin: Negative.   Neurological: Negative.   Psychiatric/Behavioral: Negative.   All other systems reviewed and are negative.  Past Medical History:  Diagnosis Date  . ALLERGIC RHINITIS   . BIPOLAR DISORDER UNSPECIFIED    depression  . Chronic diastolic heart failure ([place])   .  Depression   . FIBROMYALGIA   . GERD (gastroesophageal reflux disease)   . GOITER, MULTINODULAR   . History of gout    "haven't had it in several years" (02/08/2012)  . History of wrist fracture    rt wrist  . HYPERCHOLESTEROLEMIA   . HYPOTHYROIDISM   . Iron deficiency anemia   . Kidney stones    sees urologist @ [facility]  . Morbid obesity ([place])   . Neuropathy due to secondary diabetes ([place])   . PONV (postoperative nausea and vomiting)   . PTSD (post-traumatic stress disorder)   . RBBB   . Sleep apnea 7/14   mild osa-did not need cpap -dr Gwenette Greet  . Type II diabetes mellitus ([place])   . Wears glasses     Social History   Social History  . Marital status: Married    Spouse name: N/A  . Number of children: 2  . Years of education: 14   Occupational History  . disability Retired   Social History Main Topics  . Smoking status: Former Smoker    Packs/day: 1.00    Years: 6.00    Types: Cigarettes    Quit date: 04/06/1983  . Smokeless tobacco: Never Used     Comment: Married, lives with spouse. Disable- 2 grown kids-6 g-kids  . Alcohol use No     Comment: none  . Drug use: No  . Sexual activity: Not Currently   Other Topics Concern  . Not on file   Social History Narrative   Married, lives with spouse - 2 adult children   Disabled      No regular  exercise    Past Surgical History:  Procedure Laterality Date  . ABDOMINAL HYSTERECTOMY  1976  . CARDIAC CATHETERIZATION  2001   sees Dr Peter Martinique  . CATARACT EXTRACTION  2014  . CATARACT EXTRACTION, BILATERAL  02/2011   epps  . CHOLECYSTECTOMY  1985  . EXCISIONAL HEMORRHOIDECTOMY     "dr cut out in his office" (02/08/2012)  . INCISIONAL BREAST BIOPSY  2000   right  . JOINT REPLACEMENT     rt knee  . KNEE ARTHROSCOPY  08/2009   right  . KNEE ARTHROSCOPY WITH MEDIAL MENISECTOMY     left  . Left Cystoscopy   1990  . LEFT OOPHORECTOMY  1980  . Lithotripsy (L) Kidney  1997  . PARTIAL MASTECTOMY WITH NEEDLE  LOCALIZATION Left 09/01/2012   Procedure: PARTIAL MASTECTOMY WITH NEEDLE LOCALIZATION;  Surgeon: Adin Hector, MD;  Location: [facility];  Service: General;  Laterality: Left;  . Percitania stone removed (L) Kidney  1992  . Right nasal surgery  08/1988  . Right sinus removed  08/1989   tooth partial  . ROTATOR CUFF REPAIR  2013   right shoulder x 2  . SHOULDER ARTHROSCOPY WITH ROTATOR CUFF REPAIR AND SUBACROMIAL DECOMPRESSION Left 08/20/2013   Procedure: SHOULDER ARTHROSCOPY  AND SUBACROMIAL DECOMPRESSION;  Surgeon: Nita Sells, MD;  Location: [facility];  Service: Orthopedics;  Laterality: Left;  Left shoulder arthroscopy, debridement, subacromial decompression, distal clavical resection  . THYROIDECTOMY  04/22/2011   Procedure: THYROIDECTOMY;  Surgeon: Onnie Graham, MD;  Location: [facility];  Service: ENT;  Laterality: N/A;  TOTAL THYROIDECOTMY  . TOTAL KNEE ARTHROPLASTY  06/18/2011   Procedure: TOTAL KNEE ARTHROPLASTY;  Surgeon: Kerin Salen, MD;  Location: [facility];  Service: Orthopedics;  Laterality: Left;  DEPUY  . TOTAL KNEE ARTHROPLASTY  02/07/2012   Procedure: TOTAL KNEE ARTHROPLASTY;  Surgeon: Kerin Salen, MD;  Location: [facility];  Service: Orthopedics;  Laterality: Right;  . TUBAL LIGATION  1972    Family History  Problem Relation Age of Onset  . Hypertension Mother   . Lung cancer Mother     non-smoker  . Diabetes Father   . Hypertension Father   . Hyperlipidemia Father   . Heart attack Other   . Coronary artery disease Other   . Hypertension Sister   . Hypertension Brother   . Hypertension Brother   . Hypertension Brother   . Hypertension Sister   . Hypertension Sister   . Diabetes Daughter     No Known Allergies  Current Outpatient Prescriptions on File Prior to Visit  Medication Sig Dispense Refill  . Biotin 10 MG CAPS Take 1 capsule by mouth daily.    . Blood Glucose Monitoring Suppl (ACCU-CHEK AVIVA PLUS) w/Device KIT Use to check blood sugar  2 times per day. 1 kit 2  . Coenzyme Q10 120 MG CAPS Take 120 mg by mouth daily.    Marland Kitchen estradiol (ESTRACE) 0.5 MG tablet Take 1 tablet (0.5 mg total) by mouth daily. 90 tablet 3  . furosemide (LASIX) 40 MG tablet Take 1 tablet (40 mg total) by mouth daily. 90 tablet 3  . gabapentin (NEURONTIN) 300 MG capsule Take 2 capsules (600 mg total) by mouth 3 (three) times daily. 540 capsule 1  . glucose blood (ACCU-CHEK AVIVA) test strip Use to check blood sugar two times per day. 200 each 2  . hydrochlorothiazide (HYDRODIURIL) 25 MG tablet Take 1 tablet (25 mg total) by  mouth every morning. 90 tablet 3  . insulin NPH Human (NOVOLIN N) 100 UNIT/ML injection 60 units each morning, and 25 units each evening, and syringes 1/day 30 mL 11  . Insulin Pen Needle (B-D UF III MINI PEN NEEDLES) 31G X 5 MM MISC Use pen needles with kwik to inject insulin into skin twice a day. 180 each 3  . Insulin Syringe-Needle U-100 (INSULIN SYRINGE 1CC/31GX5/16") 31G X 5/16" 1 ML MISC Use to inject insulin two times per day. 200 each 2  . lamoTRIgine (LAMICTAL) 200 MG tablet Take 1 tablet (200 mg total) by mouth daily at 12 noon. 90 tablet 3  . Lancets (ACCU-CHEK MULTICLIX) lancets Use to check blood sugar two time per day. 200 each 2  . levothyroxine (SYNTHROID) 50 MCG tablet Take 1 tablet (50 mcg total) by mouth daily. 90 tablet 1  . lisinopril (PRINIVIL,ZESTRIL) 10 MG tablet Take 1 tablet (10 mg total) by mouth daily. 90 tablet 3  . meloxicam (MOBIC) 15 MG tablet TAKE 1 TABLET EVERY DAY 90 tablet 0  . Multiple Vitamin (MULTIVITAMIN WITH MINERALS) TABS Take 1 tablet by mouth every evening.    Marland Kitchen omeprazole (PRILOSEC) 20 MG capsule Take 1 capsule (20 mg total) by mouth daily. Take 30 minutes before a meal 90 capsule 3  . oxybutynin (DITROPAN-XL) 10 MG 24 hr tablet Take 1 tablet (10 mg total) by mouth every evening. 90 tablet 3  . potassium citrate (UROCIT-K) 10 MEQ (1080 MG) SR tablet TAKE 2 TABLETS TWICE DAILY 360 tablet 0  .  prazosin (MINIPRESS) 1 MG capsule Take 3 capsules (3 mg total) by mouth at bedtime. 270 capsule 3  . ranitidine (ZANTAC) 150 MG tablet Take 1 tablet (150 mg total) by mouth 2 (two) times daily. 180 tablet 3  . sertraline (ZOLOFT) 50 MG tablet Take 1 tablet (50 mg total) by mouth daily. 90 tablet 3   No current facility-administered medications on file prior to visit.     BP 128/84   Temp 97.6 F (36.4 C) (Oral)   Ht '5\' 2"'  (1.575 m)   Wt 265 lb 9.6 oz (120.5 kg)   BMI 48.58 kg/m       Objective:   Physical Exam  Constitutional: She is oriented to person, place, and time. She appears well-developed and well-nourished. No distress.  Cardiovascular: Normal rate, regular rhythm, normal heart sounds and intact distal pulses.  Exam reveals no gallop and no friction rub.   No murmur heard. Pulmonary/Chest: Effort normal and breath sounds normal. No respiratory distress. She has no wheezes. She has no rales. She exhibits no tenderness.  Musculoskeletal: Normal range of motion. She exhibits tenderness. She exhibits no edema or deformity.  Tenderness with palpation to bilateral vastus lateralis and calfs. There is no swelling, redness, warmth, or discoloration.   Neurological: She is alert and oriented to person, place, and time. She has normal reflexes. She displays normal reflexes. No cranial nerve deficit. She exhibits normal muscle tone. Coordination normal.  Skin: Skin is warm and dry. No rash noted. She is not diaphoretic. No erythema. No pallor.  Psychiatric: She has a normal mood and affect. Her behavior is normal. Judgment and thought content normal.  Nursing note and vitals reviewed.     Assessment & Plan:  1. Bilateral leg pain - Unsure of reason why she is experiencing this discomfort. Appears as muscle strain. Will r/o rhabdo but unlikely. There are no signs of DVT  - Magnesium - CK Total and  CKMB - Basic metabolic panel - Will have her take Motrin for pain relief.  - Follow  up with labs - Return to PCP if no improvement   Dorothyann Peng, NP

## 2016-05-20 ENCOUNTER — Telehealth: Payer: Self-pay | Admitting: Internal Medicine

## 2016-05-20 LAB — MAGNESIUM: Magnesium: 1.7 mg/dL (ref 1.5–2.5)

## 2016-05-20 LAB — CK TOTAL AND CKMB (NOT AT ARMC): Total CK: 200 U/L — ABNORMAL HIGH (ref 7–177)

## 2016-05-20 NOTE — Telephone Encounter (Signed)
I spoke with Brittany Acosta about lab work.  He states that these lab results were not drawn correctly, and that they will need to be redone.  Please advise.

## 2016-05-20 NOTE — Telephone Encounter (Signed)
I am sorry, but she will need to have the labs redone. I can put them in as future orders and she can have them done at the [place] office if she wishes. Or she can follow up with her PCP

## 2016-05-20 NOTE — Telephone Encounter (Signed)
Please advise 

## 2016-05-20 NOTE — Telephone Encounter (Signed)
Patient notified of Cory's comments, as well as Dr. Eilleen Kempf comments. Patient verbalized understanding and states she will come in to [place] office to have labs drawn again tomorrow.

## 2016-05-20 NOTE — Telephone Encounter (Signed)
Brittany Acosta has ordered blood work - she should have it done, so he can follow them up.  I will be out of the office tomorrow and next week.

## 2016-05-20 NOTE — Telephone Encounter (Signed)
Patient had some leg pain. She saw [facility] yesterday at [facility]. He informed her she had a muscle strain. She not not believe that is right because she states she did not strain any muscle in leg. She would like to know what she should do next. She wants to know if she needs to be referred to another doctor. She wants to know what is causing the pain. Please follow up or advise. Thank you.

## 2016-05-20 NOTE — Telephone Encounter (Signed)
Pt would like to have her lab results from yesterday.

## 2016-05-21 ENCOUNTER — Other Ambulatory Visit (INDEPENDENT_AMBULATORY_CARE_PROVIDER_SITE_OTHER): Payer: Medicare HMO

## 2016-05-21 ENCOUNTER — Other Ambulatory Visit: Payer: Self-pay | Admitting: Emergency Medicine

## 2016-05-21 DIAGNOSIS — M79604 Pain in right leg: Secondary | ICD-10-CM | POA: Diagnosis not present

## 2016-05-21 DIAGNOSIS — M79605 Pain in left leg: Secondary | ICD-10-CM

## 2016-05-21 LAB — CARDIAC PANEL
CK-MB: 6.2 ng/mL — ABNORMAL HIGH (ref 0.3–4.0)
Relative Index: 4 calc — ABNORMAL HIGH (ref 0.0–2.5)
Total CK: 154 U/L (ref 7–177)

## 2016-05-21 LAB — BASIC METABOLIC PANEL
BUN: 10 mg/dL (ref 6–23)
CO2: 29 mEq/L (ref 19–32)
Calcium: 9.1 mg/dL (ref 8.4–10.5)
Chloride: 94 mEq/L — ABNORMAL LOW (ref 96–112)
Creatinine, Ser: 1.03 mg/dL (ref 0.40–1.20)
GFR: 57.02 mL/min — ABNORMAL LOW (ref >60.00)
Glucose, Bld: 127 mg/dL — ABNORMAL HIGH (ref 70–99)
Potassium: 3.6 mEq/L (ref 3.5–5.1)
Sodium: 135 mEq/L (ref 135–145)

## 2016-05-21 NOTE — Progress Notes (Unsigned)
Pt came into lab today and labs were not ordered correctly. Spoke with Tommi Rumps, NP's assistant and verified labs. Labs have been re-entered.

## 2016-05-25 ENCOUNTER — Other Ambulatory Visit: Payer: Self-pay | Admitting: Internal Medicine

## 2016-05-25 DIAGNOSIS — Z1231 Encounter for screening mammogram for malignant neoplasm of breast: Secondary | ICD-10-CM

## 2016-05-30 NOTE — Progress Notes (Signed)
[name] [address], [place] [address] Phone: [phone] Subjective:    I'm seeing this patient by the request  of:  Beaulah Dinning NP  CC: leg pain  [id number]  Brittany Acosta is a 66 y.o. female coming in with complaint of bilateral lower leg pain. Patient states this seems to start below her buttocks area and radiates down towards her calves. Patient does not remember any true injury. Patient states that unfortunately she has been walking secondary to this. Feels like her leg symptoms potentially give out on her. When she rests it seems to get better. Patient states that there is some associated back pain with it. Patient states that she was seen by her orthopedic surgeon who told her that she did have arthritis in the back but no significant changes. Patient is traveling at this time and is unable to do physical therapy. Recently severity of pain a 7 out of 10.     Past Medical History:  Diagnosis Date  . ALLERGIC RHINITIS   . BIPOLAR DISORDER UNSPECIFIED    depression  . Chronic diastolic heart failure ([place])   . Depression   . FIBROMYALGIA   . GERD (gastroesophageal reflux disease)   . GOITER, MULTINODULAR   . History of gout    "haven't had it in several years" (02/08/2012)  . History of wrist fracture    rt wrist  . HYPERCHOLESTEROLEMIA   . HYPOTHYROIDISM   . Iron deficiency anemia   . Kidney stones    sees urologist @ [facility]  . Morbid obesity ([place])   . Neuropathy due to secondary diabetes ([place])   . PONV (postoperative nausea and vomiting)   . PTSD (post-traumatic stress disorder)   . RBBB   . Sleep apnea 7/14   mild osa-did not need cpap -dr Gwenette Greet  . Type II diabetes mellitus ([place])   . Wears glasses    Past Surgical History:  Procedure Laterality Date  . ABDOMINAL HYSTERECTOMY  1976  . CARDIAC CATHETERIZATION  2001   sees Dr Peter Martinique  . CATARACT EXTRACTION  2014  . CATARACT EXTRACTION, BILATERAL  02/2011   epps  . CHOLECYSTECTOMY  1985  . EXCISIONAL HEMORRHOIDECTOMY     "dr cut out in his office" (02/08/2012)  . INCISIONAL BREAST BIOPSY  2000   right  . JOINT REPLACEMENT     rt knee  . KNEE ARTHROSCOPY  08/2009   right  . KNEE ARTHROSCOPY WITH MEDIAL MENISECTOMY     left  . Left Cystoscopy   1990  . LEFT OOPHORECTOMY  1980  . Lithotripsy (L) Kidney  1997  . PARTIAL MASTECTOMY WITH NEEDLE LOCALIZATION Left 09/01/2012   Procedure: PARTIAL MASTECTOMY WITH NEEDLE LOCALIZATION;  Surgeon: Adin Hector, MD;  Location: [facility];  Service: General;  Laterality: Left;  . Percitania stone removed (L) Kidney  1992  . Right nasal surgery  08/1988  . Right sinus removed  08/1989   tooth partial  . ROTATOR CUFF REPAIR  2013   right shoulder x 2  . SHOULDER ARTHROSCOPY WITH ROTATOR CUFF REPAIR AND SUBACROMIAL DECOMPRESSION Left 08/20/2013   Procedure: SHOULDER ARTHROSCOPY  AND SUBACROMIAL DECOMPRESSION;  Surgeon: Nita Sells, MD;  Location: [facility];  Service: Orthopedics;  Laterality: Left;  Left shoulder arthroscopy, debridement, subacromial decompression, distal clavical resection  . THYROIDECTOMY  04/22/2011   Procedure: THYROIDECTOMY;  Surgeon: Onnie Graham, MD;  Location: [facility];  Service: ENT;  Laterality: N/A;  TOTAL THYROIDECOTMY  . TOTAL KNEE ARTHROPLASTY  06/18/2011   Procedure: TOTAL KNEE ARTHROPLASTY;  Surgeon: Kerin Salen, MD;  Location: [facility];  Service: Orthopedics;  Laterality: Left;  DEPUY  . TOTAL KNEE ARTHROPLASTY  02/07/2012   Procedure: TOTAL KNEE ARTHROPLASTY;  Surgeon: Kerin Salen, MD;  Location: [facility];  Service: Orthopedics;  Laterality: Right;  . TUBAL LIGATION  1972   Social History   Social History  . Marital status: Married    Spouse name: N/A  . Number of children: 2  . Years of education: 14   Occupational History  . disability Retired   Social History Main Topics  . Smoking status: Former Smoker    Packs/day: 1.00    Years: 6.00     Types: Cigarettes    Quit date: 04/06/1983  . Smokeless tobacco: Never Used     Comment: Married, lives with spouse. Disable- 2 grown kids-6 g-kids  . Alcohol use No     Comment: none  . Drug use: No  . Sexual activity: Not Currently   Other Topics Concern  . None   Social History Narrative   Married, lives with spouse - 2 adult children   Disabled      No regular exercise   No Known Allergies Family History  Problem Relation Age of Onset  . Hypertension Mother   . Lung cancer Mother     non-smoker  . Diabetes Father   . Hypertension Father   . Hyperlipidemia Father   . Heart attack Other   . Coronary artery disease Other   . Hypertension Sister   . Hypertension Brother   . Hypertension Brother   . Hypertension Brother   . Hypertension Sister   . Hypertension Sister   . Diabetes Daughter     Past medical history, social, surgical and family history all reviewed in electronic medical record.  No pertanent information unless stated regarding to the chief complaint.   Review of Systems:Review of systems updated and as accurate as of 05/31/16  No headache, visual changes, nausea, vomiting, diarrhea, constipation, dizziness, abdominal pain, skin rash, fevers, chills, night sweats, weight loss, swollen lymph nodes, chest pain, shortness of breath, . + muscle aches, joint swelling, chronic numbness, mood changes.   Objective  Blood pressure (!) 144/82, pulse 78, height 5\' 2"  (1.575 m), weight 264 lb (119.7 kg), SpO2 97 %. Systems examined below as of 05/31/16   General: No apparent distress alert and oriented x3 mood and affect normal, dressed appropriately.  HEENT: Pupils equal, extraocular movements intact  Respiratory: Patient's speak in full sentences and does not appear short of breath  Cardiovascular: No lower extremity edema, non tender, no erythema  Skin: Warm dry intact with no signs of infection or rash on extremities or on axial skeleton.  Abdomen: Soft  nontender  Neuro: Cranial nerves II through XII are intact, neurovascularly intact in all extremities with  2+ pulses.  Lymph: No lymphadenopathy of posterior or anterior cervical chain or axillae bilaterally.  Gait Antalgic gait  MSK:  Non tender with full range of motion and good stability and symmetric strength and tone of shoulders, elbows, wrist, hip, knee and ankles bilaterally. Mild arthritic changes of multiple joints does have some weakness of the lower Jimmy's bilaterally Back Exam:  Inspection: Unremarkable  Motion: Flexion 45 deg, Extension 15 deg, Side Bending to 35 deg bilaterally,  Rotation to 35 deg bilaterally  SLR laying: Positive right side XSLR laying:  Positive and 20 with radicular symptoms down the right laying Palpable tenderness: Diffuse tenderness of the per spinal musculature of the lumbar spine. FABER: Severe tightness. Sensory change: Gross sensation intact to all lumbar and sacral dermatomes.  Reflexes: 1+ at both patellar tendons, 1+ at achilles tendons, Babinski's downgoing. No beats of clonus Strength at foot  4 out of 5 strength      Impression and Recommendations:     This case required medical decision making of moderate complexity.      Note: This dictation was prepared with Dragon dictation along with smaller phrase technology. Any transcriptional errors that result from this process are unintentional.

## 2016-05-31 ENCOUNTER — Encounter: Payer: Self-pay | Admitting: Family Medicine

## 2016-05-31 ENCOUNTER — Ambulatory Visit (INDEPENDENT_AMBULATORY_CARE_PROVIDER_SITE_OTHER): Payer: Medicare HMO | Admitting: Family Medicine

## 2016-05-31 VITALS — BP 144/82 | HR 78 | Ht 62.0 in | Wt 264.0 lb

## 2016-05-31 DIAGNOSIS — M5416 Radiculopathy, lumbar region: Secondary | ICD-10-CM

## 2016-05-31 NOTE — Patient Instructions (Addendum)
Good to see you  Brittany Acosta is your friend.  MRI will be ordered and they will call you  IF it is what I think we will then get a epidural and try to get the pain under control.  In the meantime you could try iron 65mg  daily with 500mg  of vitamin C At least 2000 IU daily of vitamin D  See me again 2 weeks after the epidural.

## 2016-05-31 NOTE — Assessment & Plan Note (Signed)
She was having radicular symptoms that seemed to be worse with extension and activity. Patient's symptoms is consistent with more of a spinal stenosis. Patient does have peripheral neuropathy that likely is clotting the picture. Encourage her to continue the vitamin supplementation. Patient was found to have weakness today which is new. Positive straight leg test as well. I do believe that advance imaging is warranted. Unable to do many medication secondary to her other comorbidities but patient would be a candidate for an epidural steroid injection if necessary. We discussed icing regimen. Unable to do physical therapy at this time because she will be traveling but has done in the past. Patient will come back 2 weeks after the epidural for further evaluation.

## 2016-06-01 ENCOUNTER — Emergency Department (HOSPITAL_COMMUNITY)
Admission: EM | Admit: 2016-06-01 | Discharge: 2016-06-01 | Disposition: A | Discharge status: Home / Self Care | Payer: Medicare HMO | Attending: Emergency Medicine | Admitting: Emergency Medicine

## 2016-06-01 ENCOUNTER — Encounter (HOSPITAL_COMMUNITY): Payer: Self-pay | Admitting: Emergency Medicine

## 2016-06-01 DIAGNOSIS — Z87891 Personal history of nicotine dependence: Secondary | ICD-10-CM | POA: Diagnosis not present

## 2016-06-01 DIAGNOSIS — Z794 Long term (current) use of insulin: Secondary | ICD-10-CM | POA: Insufficient documentation

## 2016-06-01 DIAGNOSIS — I5032 Chronic diastolic (congestive) heart failure: Secondary | ICD-10-CM | POA: Diagnosis not present

## 2016-06-01 DIAGNOSIS — Z79899 Other long term (current) drug therapy: Secondary | ICD-10-CM | POA: Insufficient documentation

## 2016-06-01 DIAGNOSIS — E039 Hypothyroidism, unspecified: Secondary | ICD-10-CM | POA: Insufficient documentation

## 2016-06-01 DIAGNOSIS — R202 Paresthesia of skin: Secondary | ICD-10-CM | POA: Insufficient documentation

## 2016-06-01 DIAGNOSIS — Z96653 Presence of artificial knee joint, bilateral: Secondary | ICD-10-CM | POA: Insufficient documentation

## 2016-06-01 DIAGNOSIS — I110 Hypertensive heart disease with heart failure: Secondary | ICD-10-CM | POA: Diagnosis not present

## 2016-06-01 DIAGNOSIS — E1140 Type 2 diabetes mellitus with diabetic neuropathy, unspecified: Secondary | ICD-10-CM | POA: Insufficient documentation

## 2016-06-01 LAB — CBC WITH DIFFERENTIAL/PLATELET
Basophils Absolute: 0 10*3/uL (ref 0.0–0.1)
Basophils Relative: 0 %
Eosinophils Absolute: 0.5 10*3/uL (ref 0.0–0.7)
Eosinophils Relative: 5 %
HCT: 38.4 % (ref 36.0–46.0)
Hemoglobin: 12.7 g/dL (ref 12.0–15.0)
Lymphocytes Relative: 27 %
Lymphs Abs: 2.6 10*3/uL (ref 0.7–4.0)
MCH: 27.9 pg (ref 26.0–34.0)
MCHC: 33.1 g/dL (ref 30.0–36.0)
MCV: 84.2 fL (ref 78.0–100.0)
Monocytes Absolute: 0.7 10*3/uL (ref 0.1–1.0)
Monocytes Relative: 7 %
Neutro Abs: 5.8 10*3/uL (ref 1.7–7.7)
Neutrophils Relative %: 61 %
Platelets: 341 10*3/uL (ref 150–400)
RBC: 4.56 MIL/uL (ref 3.87–5.11)
RDW: 14.4 % (ref 11.5–15.5)
WBC: 9.7 10*3/uL (ref 4.0–10.5)

## 2016-06-01 LAB — URINALYSIS, ROUTINE W REFLEX MICROSCOPIC
Bilirubin Urine: NEGATIVE
Glucose, UA: NEGATIVE mg/dL
Hgb urine dipstick: NEGATIVE
Ketones, ur: NEGATIVE mg/dL
Leukocytes, UA: NEGATIVE
Nitrite: NEGATIVE
Protein, ur: NEGATIVE mg/dL
Specific Gravity, Urine: 1.006 (ref 1.005–1.030)
pH: 7 (ref 5.0–8.0)

## 2016-06-01 LAB — COMPREHENSIVE METABOLIC PANEL
ALT: 24 U/L (ref 14–54)
AST: 27 U/L (ref 15–41)
Albumin: 3.7 g/dL (ref 3.5–5.0)
Alkaline Phosphatase: 96 U/L (ref 38–126)
Anion gap: 10 (ref 5–15)
BUN: 10 mg/dL (ref 6–20)
CO2: 30 mmol/L (ref 22–32)
Calcium: 9.4 mg/dL (ref 8.9–10.3)
Chloride: 98 mmol/L — ABNORMAL LOW (ref 101–111)
Creatinine, Ser: 0.92 mg/dL (ref 0.44–1.00)
GFR calc Af Amer: >60 mL/min (ref >60)
GFR calc non Af Amer: >60 mL/min (ref >60)
Glucose, Bld: 163 mg/dL — ABNORMAL HIGH (ref 65–99)
Potassium: 3.7 mmol/L (ref 3.5–5.1)
Sodium: 138 mmol/L (ref 135–145)
Total Bilirubin: 0.2 mg/dL — ABNORMAL LOW (ref 0.3–1.2)
Total Protein: 6.8 g/dL (ref 6.5–8.1)

## 2016-06-01 LAB — I-STAT TROPONIN, ED: Troponin i, poc: 0 ng/mL (ref 0.00–0.08)

## 2016-06-01 NOTE — Discharge Instructions (Signed)
Follow-up with your primary Dr. if your symptoms persist to discuss referral to a neurologist.  Return to the emergency department if symptoms significantly worsen or change in the meantime.

## 2016-06-01 NOTE — ED Triage Notes (Signed)
Pt from home with c/o bilateral hand numbness starting last night and that she hasn't been able to urinate since last night which is not normal.  NAD, A&O.

## 2016-06-01 NOTE — ED Provider Notes (Signed)
[facility] DEPT Provider Note   CSN: [id number] Arrival date & time: 06/01/16  0940     History   Chief Complaint Chief Complaint  Patient presents with  . Numbness  . Dysuria    HPI Brittany Acosta is a 66 y.o. female.  Patient is a 66 year old female with past medical history of diabetes, obesity, hypothyroid, fibromyalgia, peripheral neuropathy. She presents for evaluation of numbness in both hands and a reported inability to urinate since last night. She states she has the urge to go, but can't. She denies any dysuria. She denies any fevers or chills. She denies any weakness, headache, neck pain.   The history is provided by the patient.  Dysuria   This is a new problem. The current episode started yesterday. The problem has been gradually worsening. The pain is moderate. There has been no fever. Pertinent negatives include no chills. She has tried nothing for the symptoms.    Past Medical History:  Diagnosis Date  . ALLERGIC RHINITIS   . BIPOLAR DISORDER UNSPECIFIED    depression  . Chronic diastolic heart failure ([place])   . Depression   . FIBROMYALGIA   . GERD (gastroesophageal reflux disease)   . GOITER, MULTINODULAR   . History of gout    "haven't had it in several years" (02/08/2012)  . History of wrist fracture    rt wrist  . HYPERCHOLESTEROLEMIA   . HYPOTHYROIDISM   . Iron deficiency anemia   . Kidney stones    sees urologist @ [facility]  . Morbid obesity ([place])   . Neuropathy due to secondary diabetes ([place])   . PONV (postoperative nausea and vomiting)   . PTSD (post-traumatic stress disorder)   . RBBB   . Sleep apnea 7/14   mild osa-did not need cpap -dr Gwenette Greet  . Type II diabetes mellitus ([place])   . Wears glasses     Patient Active Problem List   Diagnosis Date Noted  . Diabetes ([place]) 06/10/2015  . Family history of early CAD 04/18/2015  . Right bundle branch block 04/18/2015  . Bifascicular block 04/18/2015  . Tailbone injury 11/05/2014  . Lumbar  radiculopathy 05/31/2014  . Greater trochanteric bursitis of left hip 04/22/2014  . Achilles tendinosis 02/19/2014  . Gait disorder 11/14/2013  . Recurrent falls 11/14/2013  . Multiple fractures 11/14/2013  . Pain in joint, ankle and foot 07/02/2013  . Peripheral neuropathy ([place]) 09/13/2012  . Migraine 07/10/2012  . Osteoarthritis of right knee 02/10/2012  . Osteoarthritis of left knee 06/21/2011  . GERD (gastroesophageal reflux disease) 02/09/2011  . Unspecified vitamin D deficiency 08/27/2010  . FIBROMYALGIA 03/23/2010  . GOITER, MULTINODULAR 03/24/2009  . Edema 09/06/2008  . Morbid obesity ([place]) 09/03/2008  . Hypothyroidism 06/10/2008  . HYPERCHOLESTEROLEMIA 06/10/2008  . BIPOLAR DISORDER UNSPECIFIED 06/10/2008  . Essential hypertension 06/10/2008  . ALLERGIC RHINITIS 06/10/2008  . Asymptomatic postmenopausal status 06/10/2008    Past Surgical History:  Procedure Laterality Date  . ABDOMINAL HYSTERECTOMY  1976  . CARDIAC CATHETERIZATION  2001   sees Dr Peter Martinique  . CATARACT EXTRACTION  2014  . CATARACT EXTRACTION, BILATERAL  02/2011   epps  . CHOLECYSTECTOMY  1985  . EXCISIONAL HEMORRHOIDECTOMY     "dr cut out in his office" (02/08/2012)  . INCISIONAL BREAST BIOPSY  2000   right  . JOINT REPLACEMENT     rt knee  . KNEE ARTHROSCOPY  08/2009   right  . KNEE ARTHROSCOPY WITH MEDIAL MENISECTOMY  left  . Left Cystoscopy   1990  . LEFT OOPHORECTOMY  1980  . Lithotripsy (L) Kidney  1997  . PARTIAL MASTECTOMY WITH NEEDLE LOCALIZATION Left 09/01/2012   Procedure: PARTIAL MASTECTOMY WITH NEEDLE LOCALIZATION;  Surgeon: Adin Hector, MD;  Location: [facility];  Service: General;  Laterality: Left;  . Percitania stone removed (L) Kidney  1992  . Right nasal surgery  08/1988  . Right sinus removed  08/1989   tooth partial  . ROTATOR CUFF REPAIR  2013   right shoulder x 2  . SHOULDER ARTHROSCOPY WITH ROTATOR CUFF REPAIR AND SUBACROMIAL DECOMPRESSION Left 08/20/2013    Procedure: SHOULDER ARTHROSCOPY  AND SUBACROMIAL DECOMPRESSION;  Surgeon: Nita Sells, MD;  Location: [facility];  Service: Orthopedics;  Laterality: Left;  Left shoulder arthroscopy, debridement, subacromial decompression, distal clavical resection  . THYROIDECTOMY  04/22/2011   Procedure: THYROIDECTOMY;  Surgeon: Onnie Graham, MD;  Location: [facility];  Service: ENT;  Laterality: N/A;  TOTAL THYROIDECOTMY  . TOTAL KNEE ARTHROPLASTY  06/18/2011   Procedure: TOTAL KNEE ARTHROPLASTY;  Surgeon: Kerin Salen, MD;  Location: [facility];  Service: Orthopedics;  Laterality: Left;  DEPUY  . TOTAL KNEE ARTHROPLASTY  02/07/2012   Procedure: TOTAL KNEE ARTHROPLASTY;  Surgeon: Kerin Salen, MD;  Location: [facility];  Service: Orthopedics;  Laterality: Right;  . TUBAL LIGATION  1972    OB History    No data available       Home Medications    Prior to Admission medications   Medication Sig Start Date End Date Taking? Authorizing Provider  Biotin 10 MG CAPS Take 1 capsule by mouth daily.    Historical Provider, MD  Blood Glucose Monitoring Suppl (ACCU-CHEK AVIVA PLUS) w/Device KIT Use to check blood sugar 2 times per day. 12/22/15   Renato Shin, MD  Coenzyme Q10 120 MG CAPS Take 120 mg by mouth daily.    Historical Provider, MD  estradiol (ESTRACE) 0.5 MG tablet Take 1 tablet (0.5 mg total) by mouth daily. 01/06/16   [name], MD  furosemide (LASIX) 40 MG tablet Take 1 tablet (40 mg total) by mouth daily. 12/24/15   [name], MD  gabapentin (NEURONTIN) 600 MG tablet Take 600 mg by mouth 4 (four) times daily. 4.5 tablets daily.    Historical Provider, MD  glucose blood (ACCU-CHEK AVIVA) test strip Use to check blood sugar two times per day. 01/08/16   Renato Shin, MD  hydrochlorothiazide (HYDRODIURIL) 25 MG tablet Take 1 tablet (25 mg total) by mouth every morning. 12/24/15   [name], MD  Insulin Syringe-Needle U-100 (INSULIN SYRINGE 1CC/31GX5/16") 31G X 5/16" 1 ML MISC Use  to inject insulin two times per day. 12/22/15   Renato Shin, MD  lamoTRIgine (LAMICTAL) 200 MG tablet Take 1 tablet (200 mg total) by mouth daily at 12 noon. 01/06/16   [name], MD  Lancets (ACCU-CHEK MULTICLIX) lancets Use to check blood sugar two time per day. 12/22/15   Renato Shin, MD  levothyroxine (SYNTHROID) 50 MCG tablet Take 1 tablet (50 mcg total) by mouth daily. 12/24/15   [name], MD  lisinopril (PRINIVIL,ZESTRIL) 10 MG tablet Take 1 tablet (10 mg total) by mouth daily. 01/06/16   [name], MD  meloxicam (MOBIC) 15 MG tablet TAKE 1 TABLET EVERY DAY 03/01/16   [name], MD  Multiple Vitamin (MULTIVITAMIN WITH MINERALS) TABS Take 1 tablet by mouth every evening.  Historical Provider, MD  omeprazole (PRILOSEC) 20 MG capsule Take 1 capsule (20 mg total) by mouth daily. Take 30 minutes before a meal 12/24/15   [name], MD  oxybutynin (DITROPAN-XL) 10 MG 24 hr tablet Take 1 tablet (10 mg total) by mouth every evening. 12/24/15   [name], MD  potassium citrate (UROCIT-K) 10 MEQ (1080 MG) SR tablet TAKE 2 TABLETS TWICE DAILY 04/23/16   [name], MD  prazosin (MINIPRESS) 1 MG capsule Take 3 capsules (3 mg total) by mouth at bedtime. 01/06/16   [name], MD  ranitidine (ZANTAC) 150 MG tablet Take 1 tablet (150 mg total) by mouth 2 (two) times daily. 01/06/16   [name], MD  sertraline (ZOLOFT) 50 MG tablet Take 1 tablet (50 mg total) by mouth daily. 02/19/16   [name], MD    Family History Family History  Problem Relation Age of Onset  . Hypertension Mother   . Lung cancer Mother     non-smoker  . Diabetes Father   . Hypertension Father   . Hyperlipidemia Father   . Heart attack Other   . Coronary artery disease Other   . Hypertension Sister   . Hypertension Brother   . Hypertension Brother   . Hypertension Brother   . Hypertension Sister   . Hypertension Sister   . Diabetes Daughter     Social History Social History  Substance  Use Topics  . Smoking status: Former Smoker    Packs/day: 1.00    Years: 6.00    Types: Cigarettes    Quit date: 04/06/1983  . Smokeless tobacco: Never Used     Comment: Married, lives with spouse. Disable- 2 grown kids-6 g-kids  . Alcohol use No     Comment: none     Allergies   Patient has no known allergies.   Review of Systems Review of Systems  Constitutional: Negative for chills.  Genitourinary: Positive for dysuria.  All other systems reviewed and are negative.    Physical Exam Updated Vital Signs BP 133/55 (BP Location: Right Arm)   Pulse 69   Temp 97.7 F (36.5 C) (Oral)   Resp 18   SpO2 97%   Physical Exam  Constitutional: She is oriented to person, place, and time. She appears well-developed and well-nourished. No distress.  HENT:  Head: Normocephalic and atraumatic.  Neck: Normal range of motion. Neck supple.  Cardiovascular: Normal rate and regular rhythm.  Exam reveals no gallop and no friction rub.   No murmur heard. Pulmonary/Chest: Effort normal and breath sounds normal. No respiratory distress. She has no wheezes.  Abdominal: Soft. Bowel sounds are normal. She exhibits no distension. There is no tenderness.  Musculoskeletal: Normal range of motion.  Neurological: She is alert and oriented to person, place, and time. No cranial nerve deficit. She exhibits normal muscle tone. Coordination normal.  Strength is 5 out of 5 in both upper extremities. Sensation is intact throughout the entire upper extremity and hand. Ulnar and radial pulses are easily palpable bilaterally.  Skin: Skin is warm and dry. She is not diaphoretic.  Nursing note and vitals reviewed.    ED Treatments / Results  Labs (all labs ordered are listed, but only abnormal results are displayed) Labs Reviewed  URINALYSIS, ROUTINE W REFLEX MICROSCOPIC  CBC WITH DIFFERENTIAL/PLATELET  COMPREHENSIVE METABOLIC PANEL  URINALYSIS, ROUTINE W REFLEX MICROSCOPIC  I-STAT [place], ED     EKG  EKG Interpretation None  Radiology No results found.  Procedures Procedures (including critical care time)  Medications Ordered in ED Medications - No data to display   Initial Impression / Assessment and Plan / ED Course  I have reviewed the triage vital signs and the nursing notes.  Pertinent labs & imaging results that were available during my care of the patient were reviewed by me and considered in my medical decision making (see chart for details).  Patient presents with complaints of tingling to both arms. This began in the absence of any injury or trauma. She is neurologically intact. Strength is 5 out of 5, coordination is normal, and sensation is symmetrical. Her laboratory studies are unremarkable.  I am uncertain as to what is causing her numbness, however I highly doubt stroke, cervical problems, or any structural issues. Her symptoms may be metabolic, possibly related to a diabetic neuropathy, or possibly even anxiety.  I've advised her to give this situation time and follow-up with her primary Dr. if she is not improving in the next week. She does have an upcoming appointment and will discuss if it does not improve. I advised her she may require nerve conduction studies if the symptoms persist.  Final Clinical Impressions(s) / ED Diagnoses   Final diagnoses:  None    New Prescriptions New Prescriptions   No medications on file     Veryl Speak, MD 06/01/16 1212

## 2016-06-06 ENCOUNTER — Ambulatory Visit
Admit: 2016-06-06 | Discharge: 2016-06-06 | Disposition: A | Discharge status: Home / Self Care | Payer: Medicare HMO | Attending: Family Medicine | Admitting: Family Medicine

## 2016-06-06 DIAGNOSIS — M5416 Radiculopathy, lumbar region: Secondary | ICD-10-CM

## 2016-06-06 DIAGNOSIS — M48061 Spinal stenosis, lumbar region without neurogenic claudication: Secondary | ICD-10-CM | POA: Diagnosis not present

## 2016-06-07 ENCOUNTER — Telehealth: Payer: Self-pay | Admitting: *Deleted

## 2016-06-07 DIAGNOSIS — M5416 Radiculopathy, lumbar region: Secondary | ICD-10-CM

## 2016-06-07 NOTE — Telephone Encounter (Signed)
-----   Message from Lyndal Pulley, DO sent at 06/07/2016  7:52 AM EST ----- Lets do epidural at L4-L5 please.

## 2016-06-07 NOTE — Telephone Encounter (Signed)
Order entered & sent to [facility].  

## 2016-06-08 ENCOUNTER — Other Ambulatory Visit: Payer: Self-pay | Admitting: Internal Medicine

## 2016-06-08 NOTE — Telephone Encounter (Signed)
Patient is calling about her MRI results. Can you please follow up with her once you have some information.

## 2016-06-09 NOTE — Telephone Encounter (Signed)
Called patient and discussed findings, she will do epidural, order already placed.

## 2016-06-13 ENCOUNTER — Encounter (HOSPITAL_COMMUNITY): Payer: Self-pay

## 2016-06-13 ENCOUNTER — Emergency Department (HOSPITAL_COMMUNITY)
Admission: EM | Admit: 2016-06-13 | Discharge: 2016-06-14 | Disposition: A | Discharge status: Home / Self Care | Payer: Medicare HMO | Attending: Physician Assistant | Admitting: Physician Assistant

## 2016-06-13 DIAGNOSIS — E1140 Type 2 diabetes mellitus with diabetic neuropathy, unspecified: Secondary | ICD-10-CM | POA: Diagnosis not present

## 2016-06-13 DIAGNOSIS — Z96653 Presence of artificial knee joint, bilateral: Secondary | ICD-10-CM | POA: Insufficient documentation

## 2016-06-13 DIAGNOSIS — Z794 Long term (current) use of insulin: Secondary | ICD-10-CM | POA: Diagnosis not present

## 2016-06-13 DIAGNOSIS — Z79899 Other long term (current) drug therapy: Secondary | ICD-10-CM | POA: Insufficient documentation

## 2016-06-13 DIAGNOSIS — E039 Hypothyroidism, unspecified: Secondary | ICD-10-CM | POA: Insufficient documentation

## 2016-06-13 DIAGNOSIS — G8929 Other chronic pain: Secondary | ICD-10-CM | POA: Diagnosis not present

## 2016-06-13 DIAGNOSIS — M5441 Lumbago with sciatica, right side: Secondary | ICD-10-CM | POA: Diagnosis not present

## 2016-06-13 DIAGNOSIS — Z87891 Personal history of nicotine dependence: Secondary | ICD-10-CM | POA: Diagnosis not present

## 2016-06-13 DIAGNOSIS — M545 Low back pain: Secondary | ICD-10-CM | POA: Diagnosis present

## 2016-06-13 DIAGNOSIS — M5442 Lumbago with sciatica, left side: Secondary | ICD-10-CM | POA: Insufficient documentation

## 2016-06-13 DIAGNOSIS — I110 Hypertensive heart disease with heart failure: Secondary | ICD-10-CM | POA: Insufficient documentation

## 2016-06-13 DIAGNOSIS — I5032 Chronic diastolic (congestive) heart failure: Secondary | ICD-10-CM | POA: Insufficient documentation

## 2016-06-13 MED ORDER — OXYCODONE-ACETAMINOPHEN 5-325 MG PO TABS
1.0000 | ORAL_TABLET | Freq: Once | ORAL | Status: AC
  Administered 2016-06-13: 1 via ORAL
  Filled 2016-06-13: qty 1

## 2016-06-13 MED ORDER — OXYCODONE-ACETAMINOPHEN 5-325 MG PO TABS: 1.0000 | ORAL_TABLET | Freq: Four times a day (QID) | ORAL | 0 refills | Status: DC | PRN

## 2016-06-13 MED ORDER — METHOCARBAMOL 500 MG PO TABS
500.0000 mg | ORAL_TABLET | Freq: Once | ORAL | Status: AC
  Administered 2016-06-13: 500 mg via ORAL
  Filled 2016-06-13: qty 1

## 2016-06-13 MED ORDER — CYCLOBENZAPRINE HCL 10 MG PO TABS: 10.0000 mg | ORAL_TABLET | Freq: Two times a day (BID) | ORAL | 0 refills | Status: DC | PRN

## 2016-06-13 MED ORDER — HYDROMORPHONE HCL 2 MG/ML IJ SOLN
1.0000 mg | Freq: Once | INTRAMUSCULAR | Status: AC
  Administered 2016-06-13: 1 mg via INTRAVENOUS
  Filled 2016-06-13: qty 1

## 2016-06-13 MED ORDER — KETOROLAC TROMETHAMINE 30 MG/ML IJ SOLN
15.0000 mg | Freq: Once | INTRAMUSCULAR | Status: AC
  Administered 2016-06-13: 15 mg via INTRAVENOUS
  Filled 2016-06-13: qty 1

## 2016-06-13 MED ORDER — IBUPROFEN 600 MG PO TABS: 600.0000 mg | ORAL_TABLET | Freq: Four times a day (QID) | ORAL | 0 refills | Status: DC | PRN

## 2016-06-13 NOTE — ED Provider Notes (Signed)
[facility] DEPT Provider Note   CSN: [id number] Arrival date & time: 06/13/16  1139     History   Chief Complaint No chief complaint on file.   HPI Brittany Acosta is a 66 y.o. female presenting with sudden onset worsening lower back pain since last night, which periodically radiates down the back of her legs. The pain is worse with any movements. She has tried gabapentin without relief. She is scheduled for an epidural on 06/16/16, but the pain was unbearable and she could not walk due to pain and decided to be seen in the Ed. She denies N/V, abdominal pain, dysuria, loss of bowel or bladder function, iv drug use, night sweats, malignancy.  HPI  Past Medical History:  Diagnosis Date  . ALLERGIC RHINITIS   . BIPOLAR DISORDER UNSPECIFIED    depression  . Chronic diastolic heart failure ([place])   . Depression   . FIBROMYALGIA   . GERD (gastroesophageal reflux disease)   . GOITER, MULTINODULAR   . History of gout    "haven't had it in several years" (02/08/2012)  . History of wrist fracture    rt wrist  . HYPERCHOLESTEROLEMIA   . HYPOTHYROIDISM   . Iron deficiency anemia   . Kidney stones    sees urologist @ [place]  . Morbid obesity ([place])   . Neuropathy due to secondary diabetes ([place])   . PONV (postoperative nausea and vomiting)   . PTSD (post-traumatic stress disorder)   . RBBB   . Sleep apnea 7/14   mild osa-did not need cpap -dr Gwenette Greet  . Type II diabetes mellitus ([place])   . Wears glasses     Patient Active Problem List   Diagnosis Date Noted  . Diabetes ([place]) 06/10/2015  . Family history of early CAD 04/18/2015  . Right bundle branch block 04/18/2015  . Bifascicular block 04/18/2015  . Tailbone injury 11/05/2014  . Lumbar radiculopathy 05/31/2014  . Greater trochanteric bursitis of left hip 04/22/2014  . Achilles tendinosis 02/19/2014  . Gait disorder 11/14/2013  . Recurrent falls 11/14/2013  . Multiple fractures 11/14/2013  . Pain in joint, ankle and foot  07/02/2013  . Peripheral neuropathy ([place]) 09/13/2012  . Migraine 07/10/2012  . Osteoarthritis of right knee 02/10/2012  . Osteoarthritis of left knee 06/21/2011  . GERD (gastroesophageal reflux disease) 02/09/2011  . Unspecified vitamin D deficiency 08/27/2010  . FIBROMYALGIA 03/23/2010  . GOITER, MULTINODULAR 03/24/2009  . Edema 09/06/2008  . Morbid obesity ([place]) 09/03/2008  . Hypothyroidism 06/10/2008  . HYPERCHOLESTEROLEMIA 06/10/2008  . BIPOLAR DISORDER UNSPECIFIED 06/10/2008  . Essential hypertension 06/10/2008  . ALLERGIC RHINITIS 06/10/2008  . Asymptomatic postmenopausal status 06/10/2008    Past Surgical History:  Procedure Laterality Date  . ABDOMINAL HYSTERECTOMY  1976  . CARDIAC CATHETERIZATION  2001   sees Dr Peter Martinique  . CATARACT EXTRACTION  2014  . CATARACT EXTRACTION, BILATERAL  02/2011   epps  . CHOLECYSTECTOMY  1985  . EXCISIONAL HEMORRHOIDECTOMY     "dr cut out in his office" (02/08/2012)  . INCISIONAL BREAST BIOPSY  2000   right  . JOINT REPLACEMENT     rt knee  . KNEE ARTHROSCOPY  08/2009   right  . KNEE ARTHROSCOPY WITH MEDIAL MENISECTOMY     left  . Left Cystoscopy   1990  . LEFT OOPHORECTOMY  1980  . Lithotripsy (L) Kidney  1997  . PARTIAL MASTECTOMY WITH NEEDLE LOCALIZATION Left 09/01/2012   Procedure: PARTIAL MASTECTOMY WITH NEEDLE LOCALIZATION;  Surgeon:  Adin Hector, MD;  Location: [facility];  Service: General;  Laterality: Left;  . Percitania stone removed (L) Kidney  1992  . Right nasal surgery  08/1988  . Right sinus removed  08/1989   tooth partial  . ROTATOR CUFF REPAIR  2013   right shoulder x 2  . SHOULDER ARTHROSCOPY WITH ROTATOR CUFF REPAIR AND SUBACROMIAL DECOMPRESSION Left 08/20/2013   Procedure: SHOULDER ARTHROSCOPY  AND SUBACROMIAL DECOMPRESSION;  Surgeon: Nita Sells, MD;  Location: [facility];  Service: Orthopedics;  Laterality: Left;  Left shoulder arthroscopy, debridement, subacromial decompression,  distal clavical resection  . THYROIDECTOMY  04/22/2011   Procedure: THYROIDECTOMY;  Surgeon: Onnie Graham, MD;  Location: [facility];  Service: ENT;  Laterality: N/A;  TOTAL THYROIDECOTMY  . TOTAL KNEE ARTHROPLASTY  06/18/2011   Procedure: TOTAL KNEE ARTHROPLASTY;  Surgeon: Kerin Salen, MD;  Location: [facility];  Service: Orthopedics;  Laterality: Left;  DEPUY  . TOTAL KNEE ARTHROPLASTY  02/07/2012   Procedure: TOTAL KNEE ARTHROPLASTY;  Surgeon: Kerin Salen, MD;  Location: [facility];  Service: Orthopedics;  Laterality: Right;  . TUBAL LIGATION  1972    OB History    No data available       Home Medications    Prior to Admission medications   Medication Sig Start Date End Date Taking? Authorizing Provider  Biotin 10 MG CAPS Take 1 capsule by mouth daily.   Yes Historical Provider, MD  Coenzyme Q10 120 MG CAPS Take 120 mg by mouth daily.   Yes Historical Provider, MD  estradiol (ESTRACE) 0.5 MG tablet Take 1 tablet (0.5 mg total) by mouth daily. 01/06/16  Yes [name], MD  furosemide (LASIX) 40 MG tablet Take 1 tablet (40 mg total) by mouth daily. 12/24/15  Yes [name], MD  gabapentin (NEURONTIN) 600 MG tablet Take 600-900 mg by mouth 4 (four) times daily. 658m three times daily 9014mat bedtime   Yes Historical Provider, MD  hydrochlorothiazide (HYDRODIURIL) 25 MG tablet Take 1 tablet (25 mg total) by mouth every morning. 12/24/15  Yes StBinnie RailMD  lamoTRIgine (LAMICTAL) 200 MG tablet Take 1 tablet (200 mg total) by mouth daily at 12 noon. 01/06/16  Yes StBinnie RailMD  levothyroxine (SYNTHROID) 50 MCG tablet Take 1 tablet (50 mcg total) by mouth daily. 12/24/15  Yes StBinnie RailMD  lisinopril (PRINIVIL,ZESTRIL) 10 MG tablet Take 1 tablet (10 mg total) by mouth daily. 01/06/16  Yes StBinnie RailMD  meloxicam (MOBIC) 15 MG tablet TAKE 1 TABLET EVERY DAY 06/08/16  Yes StBinnie RailMD  Multiple Vitamin (MULTIVITAMIN WITH MINERALS) TABS Take 1 tablet by mouth every evening.   Yes  Historical Provider, MD  omeprazole (PRILOSEC) 20 MG capsule Take 1 capsule (20 mg total) by mouth daily. Take 30 minutes before a meal 12/24/15  Yes StBinnie RailMD  oxybutynin (DITROPAN-XL) 10 MG 24 hr tablet Take 1 tablet (10 mg total) by mouth every evening. 12/24/15  Yes StBinnie RailMD  potassium citrate (UROCIT-K) 10 MEQ (1080 MG) SR tablet TAKE 2 TABLETS TWICE DAILY 04/23/16  Yes StBinnie RailMD  prazosin (MINIPRESS) 1 MG capsule Take 3 capsules (3 mg total) by mouth at bedtime. 01/06/16  Yes StBinnie RailMD  ranitidine (ZANTAC) 150 MG tablet Take 1 tablet (150 mg total) by mouth 2 (two) times daily. 01/06/16  Yes StBinnie RailMD  sertraline (ZOLOFT)  50 MG tablet Take 1 tablet (50 mg total) by mouth daily. 02/19/16  Yes [name], MD  Blood Glucose Monitoring Suppl (ACCU-CHEK AVIVA PLUS) w/Device KIT Use to check blood sugar 2 times per day. Patient not taking: Reported on 06/01/2016 12/22/15   Renato Shin, MD  glucose blood (ACCU-CHEK AVIVA) test strip Use to check blood sugar two times per day. 01/08/16   Renato Shin, MD  Insulin Syringe-Needle U-100 (INSULIN SYRINGE 1CC/31GX5/16") 31G X 5/16" 1 ML MISC Use to inject insulin two times per day. 12/22/15   Renato Shin, MD  Lancets (ACCU-CHEK MULTICLIX) lancets Use to check blood sugar two time per day. 12/22/15   Renato Shin, MD    Family History Family History  Problem Relation Age of Onset  . Hypertension Mother   . Lung cancer Mother     non-smoker  . Diabetes Father   . Hypertension Father   . Hyperlipidemia Father   . Heart attack Other   . Coronary artery disease Other   . Hypertension Sister   . Hypertension Brother   . Hypertension Brother   . Hypertension Brother   . Hypertension Sister   . Hypertension Sister   . Diabetes Daughter     Social History Social History  Substance Use Topics  . Smoking status: Former Smoker    Packs/day: 1.00    Years: 6.00    Types: Cigarettes    Quit date: 04/06/1983  .  Smokeless tobacco: Never Used     Comment: Married, lives with spouse. Disable- 2 grown kids-6 g-kids  . Alcohol use No     Comment: none     Allergies   Patient has no known allergies.   Review of Systems Review of Systems  Constitutional: Negative for chills and fever.  HENT: Negative for ear pain and sore throat.   Eyes: Negative for pain and visual disturbance.  Respiratory: Negative for cough, chest tightness, shortness of breath and wheezing.   Cardiovascular: Negative for chest pain, palpitations and leg swelling.  Gastrointestinal: Negative for abdominal pain, nausea and vomiting.  Genitourinary: Negative for dysuria and hematuria.  Musculoskeletal: Positive for back pain. Negative for arthralgias, neck pain and neck stiffness.  Skin: Negative for color change, pallor and rash.  Neurological: Positive for numbness. Negative for dizziness, seizures, syncope and weakness.  All other systems reviewed and are negative.    Physical Exam Updated Vital Signs BP 154/62 (BP Location: Right Arm)   Pulse 64   Temp 97.6 F (36.4 C) (Axillary)   Resp 18   SpO2 94%   Physical Exam  Constitutional: She appears well-developed and well-nourished. No distress.  Afebrile, nontoxic, lying in bed in discomfort  HENT:  Head: Normocephalic and atraumatic.  Eyes: Conjunctivae are normal.  Neck: Neck supple.  Cardiovascular: Normal rate, regular rhythm, normal heart sounds and intact distal pulses.   No murmur heard. Pulmonary/Chest: Effort normal and breath sounds normal. No respiratory distress. She has no wheezes.  Abdominal: Soft. She exhibits no distension. There is no tenderness.  Musculoskeletal: She exhibits no edema.  Patient lifted her legs up from the bed but was in pain. She ambulated to ED bed and bathroom while in Ed, but reports that she is in excruciating pain.  Neurological: She is alert. No sensory deficit.  Patient has equal strength bilaterally but decreased  movement due to pain.   Skin: Skin is warm and dry.  Psychiatric: She has a normal mood and affect.  Nursing note and  vitals reviewed.    ED Treatments / Results  Labs (all labs ordered are listed, but only abnormal results are displayed) Labs Reviewed - No data to display  EKG  EKG Interpretation None       Radiology No results found. MRI 06/06/16 IMPRESSION: 1. Shallow disc protrusions at L2-3 and L3-4 but no significant neural compression. Mild lateral recess encroachment bilaterally at L3-4. 2. Multifactorial moderately severe to severe spinal and bilateral lateral recess stenosis at L4-5. 3. Advanced degenerative disease at L5-S1 but no significant spinal stenosis. Mild bilateral foraminal encroachment, left greater than right. Procedures Procedures (including critical care time)  Medications Ordered in ED Medications  oxyCODONE-acetaminophen (PERCOCET/ROXICET) 5-325 MG per tablet 1 tablet (1 tablet Oral Given 06/13/16 1412)  HYDROmorphone (DILAUDID) injection 1 mg (1 mg Intravenous Given 06/13/16 1620)     Initial Impression / Assessment and Plan / ED Course  I have reviewed the triage vital signs and the nursing notes.  Pertinent labs & imaging results that were available during my care of the patient were reviewed by me and considered in my medical decision making (see chart for details).     Patient presents with lower back pain.  No gross neurological deficits and normal neuro exam.  Patient has no gait abnormality or concern for cauda equina.  No loss of bowel or bladder control, fever, night sweats, weight loss, h/o malignancy, or IVDU.  Pain medications indicated and discussed with patient.   Patient presents with worsening back pain for past day. On exam, she is neurovascularly intact distally and came in via private vehicle and ambulated to ED bed. Exam otherwise unremarkable. She is ambulatory and no focal neuro deficits noted. Patient is scheduled for  epidural on 06/16/16 Patient and spouse were frustrated that the solution was to give pain medications as they were hoping to fix the underlying problem. Discussed with patient and spouse the reality of chronic neuropathic pain and spinal stenosis. Patient was not on any narcotic pain meds at home as she dislikes taking too many medications. She is currently on gabapentin. Will attempt to manage her pain in ED. Patient was discussed with Dr. Rogene Houston who also has seen patient and agrees with assessment and plan.  Transferred patient care at end of shift to [facility], PA-C, pending iv dilaudid and and pain improvement. Anticipate dc home with pain management and close follow up with her PCP and neurologist.  Final Clinical Impressions(s) / ED Diagnoses   Final diagnoses:  Chronic bilateral low back pain with bilateral sciatica    New Prescriptions New Prescriptions   No medications on file     Emeline General, PA-C 06/13/16 [place], MD 06/17/16 [phone]

## 2016-06-13 NOTE — Discharge Instructions (Signed)
Please follow up with your doctor on Wednesday Take medicines as directed Return for worsening symptoms

## 2016-06-13 NOTE — ED Provider Notes (Signed)
Medical screening examination/treatment/procedure(s) were conducted as a shared visit with non-physician practitioner(s) and myself.  I personally evaluated the patient during the encounter.   EKG Interpretation None       Patient presents with acute severe low back pain with pain radiating in to the upper part of both legs. She has a history of chronic back pain for the past 4-5 weeks. Has an epidural scheduled for mid this week. Patient having difficulty walking due to pain. Still no distinct or significant neuro focal motor deficit. Patient able to move her toes and feet fine. I think this is a pain control issue. Patient currently not on any pain medicines at all. Patient was hoping for admission to have everything resolved. Not just a [place] for epidural. Don't think that that's gone a be a possibility were going treat her pain with the IV pain medicines and then probably will need to go home with oral pain medicines. Patient did have an MRI of week ago which showed spinal stenosis. Correction no significant spinal stenosis. Just foraminal narrowing at several levels.  Results for orders placed or performed during the hospital encounter of 06/01/16  CBC with Differential  Result Value Ref Range   WBC 9.7 4.0 - 10.5 K/uL   RBC 4.56 3.87 - 5.11 MIL/uL   Hemoglobin 12.7 12.0 - 15.0 g/dL   HCT 38.4 36.0 - 46.0 %   MCV 84.2 78.0 - 100.0 fL   MCH 27.9 26.0 - 34.0 pg   MCHC 33.1 30.0 - 36.0 g/dL   RDW 14.4 11.5 - 15.5 %   Platelets 341 150 - 400 K/uL   Neutrophils Relative % 61 %   Neutro Abs 5.8 1.7 - 7.7 K/uL   Lymphocytes Relative 27 %   Lymphs Abs 2.6 0.7 - 4.0 K/uL   Monocytes Relative 7 %   Monocytes Absolute 0.7 0.1 - 1.0 K/uL   Eosinophils Relative 5 %   Eosinophils Absolute 0.5 0.0 - 0.7 K/uL   Basophils Relative 0 %   Basophils Absolute 0.0 0.0 - 0.1 K/uL  Comprehensive metabolic panel  Result Value Ref Range   Sodium 138 135 - 145 mmol/L   Potassium 3.7 3.5 - 5.1 mmol/L    Chloride 98 (L) 101 - 111 mmol/L   CO2 30 22 - 32 mmol/L   Glucose, Bld 163 (H) 65 - 99 mg/dL   BUN 10 6 - 20 mg/dL   Creatinine, Ser 0.92 0.44 - 1.00 mg/dL   Calcium 9.4 8.9 - 10.3 mg/dL   Total Protein 6.8 6.5 - 8.1 g/dL   Albumin 3.7 3.5 - 5.0 g/dL   AST 27 15 - 41 U/L   ALT 24 14 - 54 U/L   Alkaline Phosphatase 96 38 - 126 U/L   Total Bilirubin 0.2 (L) 0.3 - 1.2 mg/dL   GFR calc non Af Amer >60 >60 mL/min   GFR calc Af Amer >60 >60 mL/min   Anion gap 10 5 - 15  Urinalysis, Routine w reflex microscopic  Result Value Ref Range   Color, Urine STRAW (A) YELLOW   APPearance CLEAR CLEAR   Specific Gravity, Urine 1.006 1.005 - 1.030   pH 7.0 5.0 - 8.0   Glucose, UA NEGATIVE NEGATIVE mg/dL   Hgb urine dipstick NEGATIVE NEGATIVE   Bilirubin Urine NEGATIVE NEGATIVE   Ketones, ur NEGATIVE NEGATIVE mg/dL   Protein, ur NEGATIVE NEGATIVE mg/dL   Nitrite NEGATIVE NEGATIVE   Leukocytes, UA NEGATIVE NEGATIVE  I-stat troponin, ED  Result Value Ref Range   Troponin i, poc 0.00 0.00 - 0.08 ng/mL   Comment 3           Mr Lumbar Spine Wo Contrast  Result Date: 06/06/2016 CLINICAL DATA:  Bilateral leg pain, weakness and numbness for 1 month. EXAM: MRI LUMBAR SPINE WITHOUT CONTRAST TECHNIQUE: Multiplanar, multisequence MR imaging of the lumbar spine was performed. No intravenous contrast was administered. COMPARISON:  Lumbar spine radiographs 05/31/2014 FINDINGS: Segmentation: 5 lumbar type vertebral bodies. The last full intervertebral disc space is labeled L5-S1. Alignment:  Normal Vertebrae: Endplate reactive changes and a few scattered hemangiomas but no fracture or bone lesion. Conus medullaris: Extends to the T12-L1 level and appears normal. Paraspinal and other soft tissues: No significant paraspinal or retroperitoneal findings. A retroaortic left renal vein is noted incidentally. Disc levels: L1-2:  No significant findings. L2-3: Shallow broad-based disc protrusion with mild flattening of  the ventral thecal sac but no significant spinal, lateral recess or foraminal stenosis. L3-4: Bulging annulus and shallow central disc protrusion with mild mass effect on the ventral thecal sac. Mild lateral recess encroachment. No significant spinal or foraminal stenosis. Moderate facet disease. L4-5: Broad-based disc protrusion, severe facet disease and complex left synovial cyst contributing to moderately severe to severe spinal and bilateral lateral recess stenosis, left greater than right. No significant foraminal stenosis. The synovial cyst measures approximately 11 mm. L5-S1: Advanced degenerative disc disease and facet disease. Diffuse bulging annulus and osteophytic ridging with mild mass effect on the ventral thecal sac but no significant spinal stenosis. Mild bilateral foraminal encroachment, left greater than right. IMPRESSION: 1. Shallow disc protrusions at L2-3 and L3-4 but no significant neural compression. Mild lateral recess encroachment bilaterally at L3-4. 2. Multifactorial moderately severe to severe spinal and bilateral lateral recess stenosis at L4-5. 3. Advanced degenerative disease at L5-S1 but no significant spinal stenosis. Mild bilateral foraminal encroachment, left greater than right. Electronically Signed   By: Marijo Sanes M.D.   On: 06/06/2016 15:57      Fredia Sorrow, MD 06/13/16 1622

## 2016-06-13 NOTE — Progress Notes (Deleted)
Subjective:    Patient ID: Brittany Acosta, female    DOB: 03-24-1951, 66 y.o.   MRN: [id number]  HPI The patient is here for follow up.  Hypertension: She is taking her medication daily. She is compliant with a low sodium diet.  She denies chest pain, palpitations, edema, shortness of breath and regular headaches. She is exercising regularly.  She does not monitor her blood pressure at home.    Diabetes: she is following with Dr Loanne Drilling.  She is taking her medication daily as prescribed. She is compliant with a diabetic diet. She is exercising regularly. She monitors her sugars and they have been running XXX. She checks her feet daily and denies foot lesions. She is up-to-date with an ophthalmology examination.   Hypothyroidism:  She is taking her medication daily.  She denies any recent changes in energy or weight that are unexplained.   GERD:  She is taking her medication daily as prescribed.  She denies any GERD symptoms and feels her GERD is well controlled.   Bipolar,depression:  She is not following with psychiatry  Postmenopausal, HRT:    Medications and allergies reviewed with patient and updated if appropriate.  Patient Active Problem List   Diagnosis Date Noted  . Diabetes ([place]) 06/10/2015  . Family history of early CAD 04/18/2015  . Right bundle branch block 04/18/2015  . Bifascicular block 04/18/2015  . Tailbone injury 11/05/2014  . Lumbar radiculopathy 05/31/2014  . Greater trochanteric bursitis of left hip 04/22/2014  . Achilles tendinosis 02/19/2014  . Gait disorder 11/14/2013  . Recurrent falls 11/14/2013  . Multiple fractures 11/14/2013  . Pain in joint, ankle and foot 07/02/2013  . Peripheral neuropathy ([place]) 09/13/2012  . Migraine 07/10/2012  . Osteoarthritis of right knee 02/10/2012  . Osteoarthritis of left knee 06/21/2011  . GERD (gastroesophageal reflux disease) 02/09/2011  . Unspecified vitamin D deficiency 08/27/2010  . FIBROMYALGIA 03/23/2010  .  GOITER, MULTINODULAR 03/24/2009  . Edema 09/06/2008  . Morbid obesity ([place]) 09/03/2008  . Hypothyroidism 06/10/2008  . HYPERCHOLESTEROLEMIA 06/10/2008  . BIPOLAR DISORDER UNSPECIFIED 06/10/2008  . Essential hypertension 06/10/2008  . ALLERGIC RHINITIS 06/10/2008  . Asymptomatic postmenopausal status 06/10/2008    Current Outpatient Prescriptions on File Prior to Visit  Medication Sig Dispense Refill  . Biotin 10 MG CAPS Take 1 capsule by mouth daily.    . Blood Glucose Monitoring Suppl (ACCU-CHEK AVIVA PLUS) w/Device KIT Use to check blood sugar 2 times per day. (Patient not taking: Reported on 06/01/2016) 1 kit 2  . Coenzyme Q10 120 MG CAPS Take 120 mg by mouth daily.    Marland Kitchen estradiol (ESTRACE) 0.5 MG tablet Take 1 tablet (0.5 mg total) by mouth daily. 90 tablet 3  . furosemide (LASIX) 40 MG tablet Take 1 tablet (40 mg total) by mouth daily. 90 tablet 3  . gabapentin (NEURONTIN) 600 MG tablet Take 600-900 mg by mouth 4 (four) times daily. 616m three times daily 9049mat bedtime    . glucose blood (ACCU-CHEK AVIVA) test strip Use to check blood sugar two times per day. 200 each 2  . hydrochlorothiazide (HYDRODIURIL) 25 MG tablet Take 1 tablet (25 mg total) by mouth every morning. 90 tablet 3  . Insulin Syringe-Needle U-100 (INSULIN SYRINGE 1CC/31GX5/16") 31G X 5/16" 1 ML MISC Use to inject insulin two times per day. 200 each 2  . lamoTRIgine (LAMICTAL) 200 MG tablet Take 1 tablet (200 mg total) by mouth daily at 12 noon. 90 tablet 3  .  Lancets (ACCU-CHEK MULTICLIX) lancets Use to check blood sugar two time per day. 200 each 2  . levothyroxine (SYNTHROID) 50 MCG tablet Take 1 tablet (50 mcg total) by mouth daily. 90 tablet 1  . lisinopril (PRINIVIL,ZESTRIL) 10 MG tablet Take 1 tablet (10 mg total) by mouth daily. 90 tablet 3  . meloxicam (MOBIC) 15 MG tablet TAKE 1 TABLET EVERY DAY 90 tablet 0  . Multiple Vitamin (MULTIVITAMIN WITH MINERALS) TABS Take 1 tablet by mouth every evening.    Marland Kitchen  omeprazole (PRILOSEC) 20 MG capsule Take 1 capsule (20 mg total) by mouth daily. Take 30 minutes before a meal 90 capsule 3  . oxybutynin (DITROPAN-XL) 10 MG 24 hr tablet Take 1 tablet (10 mg total) by mouth every evening. 90 tablet 3  . potassium citrate (UROCIT-K) 10 MEQ (1080 MG) SR tablet TAKE 2 TABLETS TWICE DAILY 360 tablet 0  . prazosin (MINIPRESS) 1 MG capsule Take 3 capsules (3 mg total) by mouth at bedtime. 270 capsule 3  . ranitidine (ZANTAC) 150 MG tablet Take 1 tablet (150 mg total) by mouth 2 (two) times daily. 180 tablet 3  . sertraline (ZOLOFT) 50 MG tablet Take 1 tablet (50 mg total) by mouth daily. 90 tablet 3   No current facility-administered medications on file prior to visit.     Past Medical History:  Diagnosis Date  . ALLERGIC RHINITIS   . BIPOLAR DISORDER UNSPECIFIED    depression  . Chronic diastolic heart failure ([place])   . Depression   . FIBROMYALGIA   . GERD (gastroesophageal reflux disease)   . GOITER, MULTINODULAR   . History of gout    "haven't had it in several years" (02/08/2012)  . History of wrist fracture    rt wrist  . HYPERCHOLESTEROLEMIA   . HYPOTHYROIDISM   . Iron deficiency anemia   . Kidney stones    sees urologist @ [facility]  . Morbid obesity ([place])   . Neuropathy due to secondary diabetes ([place])   . PONV (postoperative nausea and vomiting)   . PTSD (post-traumatic stress disorder)   . RBBB   . Sleep apnea 7/14   mild osa-did not need cpap -dr Gwenette Greet  . Type II diabetes mellitus ([place])   . Wears glasses     Past Surgical History:  Procedure Laterality Date  . ABDOMINAL HYSTERECTOMY  1976  . CARDIAC CATHETERIZATION  2001   sees Dr Peter Martinique  . CATARACT EXTRACTION  2014  . CATARACT EXTRACTION, BILATERAL  02/2011   epps  . CHOLECYSTECTOMY  1985  . EXCISIONAL HEMORRHOIDECTOMY     "dr cut out in his office" (02/08/2012)  . INCISIONAL BREAST BIOPSY  2000   right  . JOINT REPLACEMENT     rt knee  . KNEE ARTHROSCOPY  08/2009   right    . KNEE ARTHROSCOPY WITH MEDIAL MENISECTOMY     left  . Left Cystoscopy   1990  . LEFT OOPHORECTOMY  1980  . Lithotripsy (L) Kidney  1997  . PARTIAL MASTECTOMY WITH NEEDLE LOCALIZATION Left 09/01/2012   Procedure: PARTIAL MASTECTOMY WITH NEEDLE LOCALIZATION;  Surgeon: Adin Hector, MD;  Location: [facility];  Service: General;  Laterality: Left;  . Percitania stone removed (L) Kidney  1992  . Right nasal surgery  08/1988  . Right sinus removed  08/1989   tooth partial  . ROTATOR CUFF REPAIR  2013   right shoulder x 2  . SHOULDER ARTHROSCOPY WITH ROTATOR CUFF REPAIR AND SUBACROMIAL DECOMPRESSION  Left 08/20/2013   Procedure: SHOULDER ARTHROSCOPY  AND SUBACROMIAL DECOMPRESSION;  Surgeon: Nita Sells, MD;  Location: [facility];  Service: Orthopedics;  Laterality: Left;  Left shoulder arthroscopy, debridement, subacromial decompression, distal clavical resection  . THYROIDECTOMY  04/22/2011   Procedure: THYROIDECTOMY;  Surgeon: Onnie Graham, MD;  Location: [facility];  Service: ENT;  Laterality: N/A;  TOTAL THYROIDECOTMY  . TOTAL KNEE ARTHROPLASTY  06/18/2011   Procedure: TOTAL KNEE ARTHROPLASTY;  Surgeon: Kerin Salen, MD;  Location: [facility];  Service: Orthopedics;  Laterality: Left;  DEPUY  . TOTAL KNEE ARTHROPLASTY  02/07/2012   Procedure: TOTAL KNEE ARTHROPLASTY;  Surgeon: Kerin Salen, MD;  Location: [facility];  Service: Orthopedics;  Laterality: Right;  . TUBAL LIGATION  1972    Social History   Social History  . Marital status: Married    Spouse name: N/A  . Number of children: 2  . Years of education: 14   Occupational History  . disability Retired   Social History Main Topics  . Smoking status: Former Smoker    Packs/day: 1.00    Years: 6.00    Types: Cigarettes    Quit date: 04/06/1983  . Smokeless tobacco: Never Used     Comment: Married, lives with spouse. Disable- 2 grown kids-6 g-kids  . Alcohol use No     Comment: none  . Drug use: No  . Sexual  activity: Not Currently   Other Topics Concern  . Not on file   Social History Narrative   Married, lives with spouse - 2 adult children   Disabled      No regular exercise    Family History  Problem Relation Age of Onset  . Hypertension Mother   . Lung cancer Mother     non-smoker  . Diabetes Father   . Hypertension Father   . Hyperlipidemia Father   . Heart attack Other   . Coronary artery disease Other   . Hypertension Sister   . Hypertension Brother   . Hypertension Brother   . Hypertension Brother   . Hypertension Sister   . Hypertension Sister   . Diabetes Daughter     Review of Systems     Objective:  There were no vitals filed for this visit. Wt Readings from Last 3 Encounters:  05/31/16 264 lb (119.7 kg)  05/19/16 265 lb 9.6 oz (120.5 kg)  02/16/16 266 lb (120.7 kg)   There is no height or weight on file to calculate BMI.   Physical Exam    Constitutional: Appears well-developed and well-nourished. No distress.  HENT:  Head: Normocephalic and atraumatic.  Neck: Neck supple. No tracheal deviation present. No thyromegaly present.  No cervical lymphadenopathy Cardiovascular: Normal rate, regular rhythm and normal heart sounds.   No murmur heard. No carotid bruit .  No edema Pulmonary/Chest: Effort normal and breath sounds normal. No respiratory distress. No has no wheezes. No rales.  Skin: Skin is warm and dry. Not diaphoretic.  Psychiatric: Normal mood and affect. Behavior is normal.      Assessment & Plan:    See Problem List for Assessment and Plan of chronic medical problems.

## 2016-06-13 NOTE — ED Triage Notes (Signed)
Patient complains of 4-5 weeks of chronic back pain with radiation down both legs. Has been seen for same several times and to have epidural this week for pain control. NAD

## 2016-06-13 NOTE — ED Provider Notes (Signed)
Patient signed out to me by Jason Coop PA-C at shift change. She is a 66 year old female with chronic back pain which acutely worsened last night. Plan is to get pain under control and d/c home. She has appointment on 3/14 for epidural injection. No plans for surgery.  On recheck, pain is not better. Pt is reporting urinary and fecal incontinence without saddle anesthesia although this has been ongoing for several weeks. Rectal exam was performed and she had good tone with normal perianal sensation. More pain medicine given and pain improved to a 7. She has been ambulatory in the ED. Will d/c with percocet, NSAIDs, muscle relaxer. Return precautions given.   Recardo Evangelist, PA-C 06/14/16 0025    Courteney Julio Alm, MD 06/14/16 [phone]

## 2016-06-14 ENCOUNTER — Ambulatory Visit: Payer: Commercial Managed Care - HMO | Admitting: Internal Medicine

## 2016-06-14 ENCOUNTER — Ambulatory Visit: Payer: Commercial Managed Care - HMO | Admitting: Endocrinology

## 2016-06-14 ENCOUNTER — Ambulatory Visit: Payer: Medicare HMO

## 2016-06-15 ENCOUNTER — Ambulatory Visit: Payer: Commercial Managed Care - HMO | Admitting: Endocrinology

## 2016-06-16 ENCOUNTER — Telehealth: Payer: Self-pay | Admitting: Family Medicine

## 2016-06-16 ENCOUNTER — Ambulatory Visit
Admission: RE | Admit: 2016-06-16 | Discharge: 2016-06-16 | Disposition: A | Discharge status: Home / Self Care | Payer: Medicare HMO | Source: Ambulatory Visit | Attending: Family Medicine | Admitting: Family Medicine

## 2016-06-16 ENCOUNTER — Other Ambulatory Visit: Payer: Self-pay | Admitting: Family Medicine

## 2016-06-16 DIAGNOSIS — M5416 Radiculopathy, lumbar region: Secondary | ICD-10-CM

## 2016-06-16 MED ORDER — TRAMADOL HCL 50 MG PO TABS: 50.0000 mg | ORAL_TABLET | Freq: Two times a day (BID) | ORAL | 0 refills | Status: DC | PRN

## 2016-06-16 MED ORDER — IOPAMIDOL (ISOVUE-M 200) INJECTION 41%: 1.0000 mL | Freq: Once | INTRAMUSCULAR | Status: DC

## 2016-06-16 MED ORDER — METHYLPREDNISOLONE ACETATE 40 MG/ML INJ SUSP (RADIOLOG: 120.0000 mg | Freq: Once | INTRAMUSCULAR | Status: DC

## 2016-06-16 NOTE — Discharge Instructions (Signed)

## 2016-06-16 NOTE — Telephone Encounter (Signed)
I do not prescribe that medicine.  I would maybe do tramadol if she would like.

## 2016-06-16 NOTE — Telephone Encounter (Signed)
Printed will ask it to be faxed.

## 2016-06-16 NOTE — Telephone Encounter (Signed)
Pt would like to try Tramadol. Please send into [facility] [address]

## 2016-06-16 NOTE — Telephone Encounter (Signed)
Pt called and said that she went to have her epidural done today and they were unable to do it because she had a cyst that was in the way. They told her to rest and come back on Friday at 11:30 to try to have it done again. She said that because they were not able to do it she is now out of oxyCODONE-acetaminophen (PERCOCET/ROXICET) 5-325 Mg. She would like to have it filled at [facility] on [address] in [place]. She said that she is staying with her daughter in [place] until Friday. The pt would like a call back with the status of the prescription. Please advise. Thanks [employer]

## 2016-06-16 NOTE — Telephone Encounter (Signed)
Pt daughter called to check up on this. They called ER and it was denied. She was hoping to get something done today.

## 2016-06-16 NOTE — Telephone Encounter (Signed)
Order faxed to [facility] on [address].

## 2016-06-18 ENCOUNTER — Ambulatory Visit
Admission: RE | Admit: 2016-06-18 | Discharge: 2016-06-18 | Disposition: A | Discharge status: Home / Self Care | Payer: Medicare HMO | Source: Ambulatory Visit | Attending: Family Medicine | Admitting: Family Medicine

## 2016-06-18 DIAGNOSIS — M5416 Radiculopathy, lumbar region: Secondary | ICD-10-CM

## 2016-06-18 MED ORDER — METHYLPREDNISOLONE ACETATE 40 MG/ML INJ SUSP (RADIOLOG
120.0000 mg | Freq: Once | INTRAMUSCULAR | Status: AC
  Administered 2016-06-18: 120 mg via EPIDURAL

## 2016-06-18 MED ORDER — IOPAMIDOL (ISOVUE-M 200) INJECTION 41%
1.0000 mL | Freq: Once | INTRAMUSCULAR | Status: AC
  Administered 2016-06-18: 1 mL via EPIDURAL

## 2016-06-18 NOTE — Discharge Instructions (Signed)

## 2016-06-21 ENCOUNTER — Other Ambulatory Visit: Payer: Self-pay

## 2016-06-21 ENCOUNTER — Telehealth: Payer: Self-pay | Admitting: Family Medicine

## 2016-06-21 DIAGNOSIS — M5416 Radiculopathy, lumbar region: Secondary | ICD-10-CM

## 2016-06-21 NOTE — Telephone Encounter (Signed)
Pt called stated epidural inj didn't work, still has pain, cant walk. She was wondering what to do next. Please call pt back.

## 2016-06-21 NOTE — Telephone Encounter (Signed)
Last OV note says to f/u 2 weeks after epidural inj. Will check with Dr. Tamala Julian to see if he has any other recommendations.

## 2016-06-21 NOTE — Telephone Encounter (Signed)
Called pt and advised. Referral placed to neurosurgery.

## 2016-06-21 NOTE — Telephone Encounter (Signed)
Can take up to 2 weeks to work  Would also like to put in referral to neurosurgery then.

## 2016-06-22 ENCOUNTER — Telehealth: Payer: Self-pay | Admitting: Family Medicine

## 2016-06-22 NOTE — Telephone Encounter (Signed)
Sent MyChart message to patient in regards to referral.

## 2016-06-22 NOTE — Telephone Encounter (Signed)
Brittany Flow, Do you process all referrals for Dr. Tamala Julian?  Patient is requesting referral to neurosurgeon to be processed quickly b/c she does not think she can last 2-3 weeks for a phone call from a surgeon to schedule appt.

## 2016-06-23 ENCOUNTER — Other Ambulatory Visit: Payer: Self-pay | Admitting: Neurosurgery

## 2016-06-23 DIAGNOSIS — M7138 Other bursal cyst, other site: Secondary | ICD-10-CM | POA: Diagnosis not present

## 2016-06-26 ENCOUNTER — Other Ambulatory Visit: Payer: Self-pay | Admitting: Internal Medicine

## 2016-06-26 ENCOUNTER — Encounter: Payer: Self-pay | Admitting: Adult Health

## 2016-06-28 ENCOUNTER — Encounter (HOSPITAL_COMMUNITY)
Admission: RE | Admit: 2016-06-28 | Discharge: 2016-06-28 | Disposition: A | Discharge status: Home / Self Care | Payer: Medicare HMO | Source: Ambulatory Visit | Attending: Neurosurgery | Admitting: Neurosurgery

## 2016-06-28 ENCOUNTER — Encounter (HOSPITAL_COMMUNITY): Payer: Self-pay

## 2016-06-28 DIAGNOSIS — I10 Essential (primary) hypertension: Secondary | ICD-10-CM | POA: Insufficient documentation

## 2016-06-28 DIAGNOSIS — F319 Bipolar disorder, unspecified: Secondary | ICD-10-CM | POA: Diagnosis present

## 2016-06-28 DIAGNOSIS — I4510 Unspecified right bundle-branch block: Secondary | ICD-10-CM

## 2016-06-28 DIAGNOSIS — M549 Dorsalgia, unspecified: Secondary | ICD-10-CM | POA: Diagnosis present

## 2016-06-28 DIAGNOSIS — R569 Unspecified convulsions: Secondary | ICD-10-CM | POA: Diagnosis present

## 2016-06-28 DIAGNOSIS — M7138 Other bursal cyst, other site: Secondary | ICD-10-CM

## 2016-06-28 DIAGNOSIS — M5416 Radiculopathy, lumbar region: Secondary | ICD-10-CM | POA: Diagnosis present

## 2016-06-28 DIAGNOSIS — M797 Fibromyalgia: Secondary | ICD-10-CM | POA: Diagnosis present

## 2016-06-28 DIAGNOSIS — Z90710 Acquired absence of both cervix and uterus: Secondary | ICD-10-CM | POA: Diagnosis not present

## 2016-06-28 DIAGNOSIS — M48061 Spinal stenosis, lumbar region without neurogenic claudication: Secondary | ICD-10-CM | POA: Diagnosis present

## 2016-06-28 DIAGNOSIS — Z90721 Acquired absence of ovaries, unilateral: Secondary | ICD-10-CM | POA: Diagnosis not present

## 2016-06-28 DIAGNOSIS — Z01812 Encounter for preprocedural laboratory examination: Secondary | ICD-10-CM

## 2016-06-28 DIAGNOSIS — Z9049 Acquired absence of other specified parts of digestive tract: Secondary | ICD-10-CM | POA: Diagnosis not present

## 2016-06-28 DIAGNOSIS — E119 Type 2 diabetes mellitus without complications: Secondary | ICD-10-CM | POA: Insufficient documentation

## 2016-06-28 DIAGNOSIS — Z87442 Personal history of urinary calculi: Secondary | ICD-10-CM | POA: Diagnosis not present

## 2016-06-28 DIAGNOSIS — Z6841 Body Mass Index (BMI) 40.0 and over, adult: Secondary | ICD-10-CM | POA: Diagnosis not present

## 2016-06-28 DIAGNOSIS — Z9841 Cataract extraction status, right eye: Secondary | ICD-10-CM | POA: Diagnosis not present

## 2016-06-28 DIAGNOSIS — K219 Gastro-esophageal reflux disease without esophagitis: Secondary | ICD-10-CM | POA: Diagnosis present

## 2016-06-28 DIAGNOSIS — Z9842 Cataract extraction status, left eye: Secondary | ICD-10-CM | POA: Diagnosis not present

## 2016-06-28 DIAGNOSIS — G4730 Sleep apnea, unspecified: Secondary | ICD-10-CM | POA: Diagnosis not present

## 2016-06-28 DIAGNOSIS — E042 Nontoxic multinodular goiter: Secondary | ICD-10-CM | POA: Diagnosis present

## 2016-06-28 DIAGNOSIS — E785 Hyperlipidemia, unspecified: Secondary | ICD-10-CM

## 2016-06-28 DIAGNOSIS — E1140 Type 2 diabetes mellitus with diabetic neuropathy, unspecified: Secondary | ICD-10-CM | POA: Diagnosis present

## 2016-06-28 DIAGNOSIS — F4310 Post-traumatic stress disorder, unspecified: Secondary | ICD-10-CM | POA: Diagnosis present

## 2016-06-28 DIAGNOSIS — G4733 Obstructive sleep apnea (adult) (pediatric): Secondary | ICD-10-CM | POA: Diagnosis present

## 2016-06-28 DIAGNOSIS — I5030 Unspecified diastolic (congestive) heart failure: Secondary | ICD-10-CM | POA: Insufficient documentation

## 2016-06-28 DIAGNOSIS — I110 Hypertensive heart disease with heart failure: Secondary | ICD-10-CM | POA: Diagnosis present

## 2016-06-28 DIAGNOSIS — E7800 Pure hypercholesterolemia, unspecified: Secondary | ICD-10-CM | POA: Diagnosis present

## 2016-06-28 DIAGNOSIS — I5032 Chronic diastolic (congestive) heart failure: Secondary | ICD-10-CM | POA: Diagnosis present

## 2016-06-28 DIAGNOSIS — E039 Hypothyroidism, unspecified: Secondary | ICD-10-CM | POA: Diagnosis present

## 2016-06-28 HISTORY — DX: Headache, unspecified: R51.9

## 2016-06-28 HISTORY — DX: Malignant (primary) neoplasm, unspecified: C80.1

## 2016-06-28 HISTORY — DX: Personal history of urinary calculi: Z87.442

## 2016-06-28 HISTORY — DX: Essential (primary) hypertension: I10

## 2016-06-28 HISTORY — DX: Headache: R51

## 2016-06-28 LAB — SURGICAL PCR SCREEN
MRSA, PCR: NEGATIVE
Staphylococcus aureus: NEGATIVE

## 2016-06-28 LAB — BASIC METABOLIC PANEL
Anion gap: 12 (ref 5–15)
BUN: 20 mg/dL (ref 6–20)
CO2: 28 mmol/L (ref 22–32)
Calcium: 9.4 mg/dL (ref 8.9–10.3)
Chloride: 97 mmol/L — ABNORMAL LOW (ref 101–111)
Creatinine, Ser: 1.05 mg/dL — ABNORMAL HIGH (ref 0.44–1.00)
GFR calc Af Amer: >60 mL/min (ref >60)
GFR calc non Af Amer: 55 mL/min — ABNORMAL LOW (ref >60)
Glucose, Bld: 130 mg/dL — ABNORMAL HIGH (ref 65–99)
Potassium: 3.7 mmol/L (ref 3.5–5.1)
Sodium: 137 mmol/L (ref 135–145)

## 2016-06-28 LAB — CBC WITH DIFFERENTIAL/PLATELET
Basophils Absolute: 0 10*3/uL (ref 0.0–0.1)
Basophils Relative: 0 %
Eosinophils Absolute: 0.8 10*3/uL — ABNORMAL HIGH (ref 0.0–0.7)
Eosinophils Relative: 5 %
HCT: 39.2 % (ref 36.0–46.0)
Hemoglobin: 12.9 g/dL (ref 12.0–15.0)
Lymphocytes Relative: 24 %
Lymphs Abs: 3.9 10*3/uL (ref 0.7–4.0)
MCH: 28 pg (ref 26.0–34.0)
MCHC: 32.9 g/dL (ref 30.0–36.0)
MCV: 85 fL (ref 78.0–100.0)
Monocytes Absolute: 0.8 10*3/uL (ref 0.1–1.0)
Monocytes Relative: 5 %
Neutro Abs: 10.5 10*3/uL — ABNORMAL HIGH (ref 1.7–7.7)
Neutrophils Relative %: 66 %
Platelets: 381 10*3/uL (ref 150–400)
RBC: 4.61 MIL/uL (ref 3.87–5.11)
RDW: 14.6 % (ref 11.5–15.5)
WBC: 15.9 10*3/uL — ABNORMAL HIGH (ref 4.0–10.5)

## 2016-06-28 LAB — GLUCOSE, CAPILLARY: Glucose-Capillary: 218 mg/dL — ABNORMAL HIGH (ref 65–99)

## 2016-06-28 NOTE — Pre-Procedure Instructions (Addendum)
Brittany Acosta  06/28/2016      [place], [place] [address] Phone: [phone] Fax: [phone]  [facility] - [place], [place] [address] Phone: [phone] Fax: [phone]  [place] - [address] [place]) [place] [address] Phone: [phone] Fax: [phone]    Your procedure is scheduled on Thursday, March 29th   Report to [facility] Admitting at 8:15 AM             (posted surgery time 10:24 am to 11:59 am)   Call this number if you have problems the [facility] of surgery:  [phone], or [phone] Mon - Fri from 8 - 4:30   Remember:  Do not eat food or drink liquids after midnight Wednesday.   Take these medicines the morning of surgery with A SIP OF WATER : Gabapentin, Lamictal, Omeprazole, Sertraline.              4-5 days prior to surgery, STOP TAKING any vitamins, herbal supplements, anti-inflammatories.   Do not wear jewelry, make-up or nail polish.  Do not wear lotions, powders,  perfumes, or deoderant.  Do not shave underarms & legs 48 hours prior to surgery.     Do not bring valuables to the hospital.  [facility] is not responsible for any belongings or valuables.  Contacts, dentures or bridgework may not be worn into surgery.  Leave your suitcase in the car.  After surgery it may be brought to your room. For patients admitted to the hospital, discharge time will be determined by your treatment team.  Please read over the following fact sheets that you were given. Pain Booklet, MRSA Information and Surgical Site Infection Prevention           How to Manage Your Diabetes Before and After Surgery  Why is it important to control my blood sugar before and after surgery? . Improving blood sugar levels before and after surgery helps healing and  can limit problems. . A way of improving blood sugar control is eating a healthy diet by: o  Eating less sugar and carbohydrates o  Increasing activity/exercise o  Talking with your doctor about reaching your blood sugar goals . High blood sugars (greater than 180 mg/dL) can raise your risk of infections and slow your recovery, so you will need to focus on controlling your diabetes during the weeks before surgery. . Make sure that the doctor who takes care of your diabetes knows about your planned surgery including the date and location.  How do I manage my blood sugar before surgery? . Check your blood sugar at least 4 times a day, starting 2 days before surgery, to make sure that the level is not too high or low. Marland Kitchen  o Check your blood sugar the morning of your surgery when you wake up and every 2 hours until you get to the Short Stay unit. o  . If your blood sugar is less than 70 mg/dL, you will need to treat for low blood sugar: o Do not take insulin. o  o Treat a low blood sugar (less than 70 mg/dL) with  cup of clear juice (cranberry or apple), 4 glucose tablets, OR glucose gel. o  o Recheck blood sugar in 15 minutes after treatment (to make sure it is greater than  70 mg/dL). If your blood sugar is not greater than 70 mg/dL on recheck, call [phone] for further instructions. o  . Report your blood sugar to the short stay nurse when you get to Short Stay.  . If you are admitted to the hospital after surgery: o Your blood sugar will be checked by the staff and you will probably be given insulin after surgery (instead of oral diabetes medicines) to make sure you have good blood sugar levels. o The goal for blood sugar control after surgery is 80-180 mg/dL.   WHAT DO I DO ABOUT MY DIABETES MEDICATION?   Marland Kitchen Do not take oral diabetes medicines (pills) the morning of surgery.  . THE NIGHT BEFORE SURGERY, take at dinner time only 50% (half of the dose you would normally  take) .     Marland Kitchen THE MORNING OF SURGERY, again, take only 50% of what you would normally take.  . The day of surgery, do not take other diabetes injectables, including Byetta (exenatide), Bydureon (exenatide ER), Victoza (liraglutide), or Trulicity (dulaglutide).  . If your CBG is greater than 220 mg/dL, you may take  of your sliding scale (correction) dose of insulin.  Other Instructions:          Patient Signature:  Date:   Nurse Signature:  Date:   Reviewed and Endorsed by [facility] Patient Education Committee, August 2015

## 2016-06-28 NOTE — Progress Notes (Signed)
PCP is Dr. Jenness Corner  LOV 01/2016 Endocrinologist Dr. Cordelia Pen  LOV 01/2016 Cardio  Dr. Derl Barrow  LOV 12/2009   Currently denies any heart pains, discomfort, sob. Sleep Study 10/2012 - "mild Case" Am sugars run 140-300, but can go as low as 60. A1C 6.6 02/2016 H/o states chronic diastolic heart failure, patient unfamiliar with this condition. Left Mastectomy

## 2016-06-29 ENCOUNTER — Other Ambulatory Visit (HOSPITAL_COMMUNITY): Payer: Medicare HMO

## 2016-06-29 ENCOUNTER — Telehealth: Payer: Self-pay | Admitting: Internal Medicine

## 2016-06-29 ENCOUNTER — Telehealth: Payer: Self-pay | Admitting: Endocrinology

## 2016-06-29 LAB — HEMOGLOBIN A1C
Hgb A1c MFr Bld: 7.3 % — ABNORMAL HIGH (ref 4.8–5.6)
Mean Plasma Glucose: 163 mg/dL

## 2016-06-29 NOTE — Progress Notes (Signed)
Anesthesia Chart Review:  Pt is a 66 year old female scheduled for L4-5 laminectomy for synovial cyst on 07/01/2016 with Earnie Larsson, M.D.  - PCP is Billey Gosling, MD - Endocrinologist is Renato Shin, MD  PMH includes: HTN, RBBB, DM, hyperlipidemia, diastolic CHF, iron deficiency anemia, multinodular goiter (s/p thyroidectomy; now hypothyroid), PTSD, post-op N/V, GERD. Former smoker. BMI 47.  S/p L shoulder arthroscopy 08/20/13. S/p L partial mastectomy 09/01/12. S/p R TKA 02/07/12. S/p L TKA 06/18/11.    Medications include: Lasix, HCTZ, NPH insulin, Lamictal, levothyroxine, lisinopril, Prilosec, potassium, prazosin, Zantac  Preoperative labs reviewed.   - HbA1c 7.3, glucose 130.  - WBC 15.9.   EKG 06/01/16: Sinus rhythm. RBBB and LAFB. Probable LVH  Nuclear stress test 03/08/11: Normal stress nuclear study.  Echo 03/08/11:  - Left ventricle: The cavity size was normal. Wall thicknesswas normal. Systolic function was normal. The estimatedejection fraction was in the range of 55% to 60%. Wallmotion was normal; there were no regional wall motionabnormalities. Doppler parameters are consistent withabnormal left ventricular relaxation (grade 1 diastolicdysfunction). - Pericardium, extracardiac: A trivial pericardial effusion was identified.  I spoke with pt by telephone about elevated WBC count.  Pt denied feeling ill, and denied fever, cough, sore throat, ear pain, headache, abdominal pain, N/V, UTI sx, and rashes.    I notified Lorriane Shire in Dr. Marchelle Folks office of elevated WBC.   Pt will need further assessment DOS by her assigned anesthesiologist.  If no active infection, I anticipate pt can proceed as scheduled.   Willeen Cass, FNP-BC [facility]/Anesthesiology Phone: [phone] 06/29/2016 1:50 PM

## 2016-06-29 NOTE — Telephone Encounter (Signed)
Dr. Tamala Julian has been made aware.

## 2016-06-29 NOTE — Telephone Encounter (Signed)
See message to be advised. Thanks!  

## 2016-06-29 NOTE — Telephone Encounter (Signed)
Pt called wanting to let Dr Meyer Russel and Dr Tamala Julian know that she is having surgery on Thursday, 07/01/16 at 10:30 am to remove a synovial cyst from her spine.

## 2016-06-29 NOTE — Telephone Encounter (Signed)
Patient is having surgery tomorrow for lumbar laminectomy synovial cyst, just an [place]

## 2016-06-29 NOTE — Telephone Encounter (Signed)
Noted  

## 2016-06-30 MED ORDER — DEXTROSE 5 % IV SOLN
3.0000 g | INTRAVENOUS | Status: AC
  Administered 2016-07-01: 3 g via INTRAVENOUS
  Filled 2016-06-30: qty 3000

## 2016-07-01 ENCOUNTER — Inpatient Hospital Stay (HOSPITAL_COMMUNITY): Payer: Medicare HMO | Admitting: Vascular Surgery

## 2016-07-01 ENCOUNTER — Inpatient Hospital Stay (HOSPITAL_COMMUNITY)
Admission: RE | Admit: 2016-07-01 | Discharge: 2016-07-01 | DRG: 519 | Disposition: A | Discharge status: Home / Self Care | Payer: Medicare HMO | Source: Ambulatory Visit | Attending: Neurosurgery | Admitting: Neurosurgery

## 2016-07-01 ENCOUNTER — Encounter (HOSPITAL_COMMUNITY): Payer: Self-pay | Admitting: Certified Registered Nurse Anesthetist

## 2016-07-01 ENCOUNTER — Inpatient Hospital Stay (HOSPITAL_COMMUNITY): Payer: Medicare HMO | Admitting: Certified Registered Nurse Anesthetist

## 2016-07-01 ENCOUNTER — Inpatient Hospital Stay (HOSPITAL_COMMUNITY): Payer: Medicare HMO

## 2016-07-01 ENCOUNTER — Encounter (HOSPITAL_COMMUNITY)
Admission: RE | Disposition: A | Discharge status: Home / Self Care | Payer: Self-pay | Source: Ambulatory Visit | Attending: Neurosurgery

## 2016-07-01 DIAGNOSIS — K219 Gastro-esophageal reflux disease without esophagitis: Secondary | ICD-10-CM | POA: Diagnosis present

## 2016-07-01 DIAGNOSIS — F319 Bipolar disorder, unspecified: Secondary | ICD-10-CM | POA: Diagnosis present

## 2016-07-01 DIAGNOSIS — I110 Hypertensive heart disease with heart failure: Secondary | ICD-10-CM | POA: Diagnosis present

## 2016-07-01 DIAGNOSIS — E042 Nontoxic multinodular goiter: Secondary | ICD-10-CM | POA: Diagnosis present

## 2016-07-01 DIAGNOSIS — M797 Fibromyalgia: Secondary | ICD-10-CM | POA: Diagnosis present

## 2016-07-01 DIAGNOSIS — E1140 Type 2 diabetes mellitus with diabetic neuropathy, unspecified: Secondary | ICD-10-CM | POA: Diagnosis present

## 2016-07-01 DIAGNOSIS — M5416 Radiculopathy, lumbar region: Secondary | ICD-10-CM | POA: Diagnosis present

## 2016-07-01 DIAGNOSIS — G4730 Sleep apnea, unspecified: Secondary | ICD-10-CM | POA: Diagnosis not present

## 2016-07-01 DIAGNOSIS — Z801 Family history of malignant neoplasm of trachea, bronchus and lung: Secondary | ICD-10-CM

## 2016-07-01 DIAGNOSIS — Z90721 Acquired absence of ovaries, unilateral: Secondary | ICD-10-CM | POA: Diagnosis not present

## 2016-07-01 DIAGNOSIS — E039 Hypothyroidism, unspecified: Secondary | ICD-10-CM | POA: Diagnosis present

## 2016-07-01 DIAGNOSIS — Z9842 Cataract extraction status, left eye: Secondary | ICD-10-CM

## 2016-07-01 DIAGNOSIS — I5032 Chronic diastolic (congestive) heart failure: Secondary | ICD-10-CM | POA: Diagnosis present

## 2016-07-01 DIAGNOSIS — Z87442 Personal history of urinary calculi: Secondary | ICD-10-CM

## 2016-07-01 DIAGNOSIS — Z833 Family history of diabetes mellitus: Secondary | ICD-10-CM

## 2016-07-01 DIAGNOSIS — Z6841 Body Mass Index (BMI) 40.0 and over, adult: Secondary | ICD-10-CM

## 2016-07-01 DIAGNOSIS — Z79899 Other long term (current) drug therapy: Secondary | ICD-10-CM

## 2016-07-01 DIAGNOSIS — Z419 Encounter for procedure for purposes other than remedying health state, unspecified: Secondary | ICD-10-CM

## 2016-07-01 DIAGNOSIS — G4733 Obstructive sleep apnea (adult) (pediatric): Secondary | ICD-10-CM | POA: Diagnosis present

## 2016-07-01 DIAGNOSIS — Z87891 Personal history of nicotine dependence: Secondary | ICD-10-CM

## 2016-07-01 DIAGNOSIS — M1A9XX1 Chronic gout, unspecified, with tophus (tophi): Secondary | ICD-10-CM | POA: Diagnosis present

## 2016-07-01 DIAGNOSIS — M48061 Spinal stenosis, lumbar region without neurogenic claudication: Secondary | ICD-10-CM | POA: Diagnosis present

## 2016-07-01 DIAGNOSIS — F4310 Post-traumatic stress disorder, unspecified: Secondary | ICD-10-CM | POA: Diagnosis present

## 2016-07-01 DIAGNOSIS — Z9049 Acquired absence of other specified parts of digestive tract: Secondary | ICD-10-CM

## 2016-07-01 DIAGNOSIS — I4510 Unspecified right bundle-branch block: Secondary | ICD-10-CM | POA: Diagnosis present

## 2016-07-01 DIAGNOSIS — Z90710 Acquired absence of both cervix and uterus: Secondary | ICD-10-CM | POA: Diagnosis not present

## 2016-07-01 DIAGNOSIS — Z9841 Cataract extraction status, right eye: Secondary | ICD-10-CM | POA: Diagnosis not present

## 2016-07-01 DIAGNOSIS — E119 Type 2 diabetes mellitus without complications: Secondary | ICD-10-CM | POA: Diagnosis not present

## 2016-07-01 DIAGNOSIS — I10 Essential (primary) hypertension: Secondary | ICD-10-CM | POA: Diagnosis not present

## 2016-07-01 DIAGNOSIS — Z85828 Personal history of other malignant neoplasm of skin: Secondary | ICD-10-CM

## 2016-07-01 DIAGNOSIS — R569 Unspecified convulsions: Secondary | ICD-10-CM | POA: Diagnosis present

## 2016-07-01 DIAGNOSIS — Z794 Long term (current) use of insulin: Secondary | ICD-10-CM

## 2016-07-01 DIAGNOSIS — E7800 Pure hypercholesterolemia, unspecified: Secondary | ICD-10-CM | POA: Diagnosis present

## 2016-07-01 DIAGNOSIS — Z79890 Hormone replacement therapy (postmenopausal): Secondary | ICD-10-CM

## 2016-07-01 DIAGNOSIS — M7138 Other bursal cyst, other site: Secondary | ICD-10-CM | POA: Diagnosis present

## 2016-07-01 DIAGNOSIS — M549 Dorsalgia, unspecified: Secondary | ICD-10-CM | POA: Diagnosis present

## 2016-07-01 DIAGNOSIS — Z8249 Family history of ischemic heart disease and other diseases of the circulatory system: Secondary | ICD-10-CM

## 2016-07-01 HISTORY — PX: LUMBAR LAMINECTOMY/DECOMPRESSION MICRODISCECTOMY: SHX5026

## 2016-07-01 LAB — CBC
HCT: 35.3 % — ABNORMAL LOW (ref 36.0–46.0)
Hemoglobin: 11.4 g/dL — ABNORMAL LOW (ref 12.0–15.0)
MCH: 27.7 pg (ref 26.0–34.0)
MCHC: 32.3 g/dL (ref 30.0–36.0)
MCV: 85.9 fL (ref 78.0–100.0)
Platelets: 301 10*3/uL (ref 150–400)
RBC: 4.11 MIL/uL (ref 3.87–5.11)
RDW: 14.7 % (ref 11.5–15.5)
WBC: 16.6 10*3/uL — ABNORMAL HIGH (ref 4.0–10.5)

## 2016-07-01 LAB — CREATININE, SERUM
Creatinine, Ser: 1.02 mg/dL — ABNORMAL HIGH (ref 0.44–1.00)
GFR calc Af Amer: >60 mL/min (ref >60)
GFR calc non Af Amer: 56 mL/min — ABNORMAL LOW (ref >60)

## 2016-07-01 LAB — GLUCOSE, CAPILLARY
Glucose-Capillary: 72 mg/dL (ref 65–99)
Glucose-Capillary: 77 mg/dL (ref 65–99)
Glucose-Capillary: 89 mg/dL (ref 65–99)
Glucose-Capillary: 49 mg/dL — ABNORMAL LOW (ref 65–99)
Glucose-Capillary: 169 mg/dL — ABNORMAL HIGH (ref 65–99)

## 2016-07-01 SURGERY — LUMBAR LAMINECTOMY/DECOMPRESSION MICRODISCECTOMY 1 LEVEL
Anesthesia: General | Site: Back | Laterality: Left

## 2016-07-01 MED ORDER — ESTRADIOL 1 MG PO TABS
0.5000 mg | ORAL_TABLET | Freq: Every day | ORAL | Status: DC
  Administered 2016-07-01: 0.5 mg via ORAL
  Filled 2016-07-01: qty 0.5

## 2016-07-01 MED ORDER — DEXTROSE 50 % IV SOLN
25.0000 mL | Freq: Once | INTRAVENOUS | Status: AC
  Administered 2016-07-01: 25 mL via INTRAVENOUS

## 2016-07-01 MED ORDER — DEXTROSE 50 % IV SOLN
INTRAVENOUS | Status: AC
  Filled 2016-07-01: qty 50

## 2016-07-01 MED ORDER — MIDAZOLAM HCL 2 MG/2ML IJ SOLN
INTRAMUSCULAR | Status: AC
  Filled 2016-07-01: qty 2

## 2016-07-01 MED ORDER — GABAPENTIN 300 MG PO CAPS: 900.0000 mg | ORAL_CAPSULE | Freq: Every day | ORAL | Status: DC

## 2016-07-01 MED ORDER — SODIUM CHLORIDE 0.9 % IV SOLN: 250.0000 mL | INTRAVENOUS | Status: DC

## 2016-07-01 MED ORDER — MENTHOL 3 MG MT LOZG: 1.0000 | LOZENGE | OROMUCOSAL | Status: DC | PRN

## 2016-07-01 MED ORDER — OXYBUTYNIN CHLORIDE ER 10 MG PO TB24
10.0000 mg | ORAL_TABLET | Freq: Every evening | ORAL | Status: DC
  Administered 2016-07-01: 10 mg via ORAL
  Filled 2016-07-01: qty 1

## 2016-07-01 MED ORDER — ONDANSETRON HCL 4 MG/2ML IJ SOLN
INTRAMUSCULAR | Status: DC | PRN
  Administered 2016-07-01: 4 mg via INTRAVENOUS

## 2016-07-01 MED ORDER — INSULIN NPH (HUMAN) (ISOPHANE) 100 UNIT/ML ~~LOC~~ SUSP
25.0000 [IU] | Freq: Every day | SUBCUTANEOUS | Status: DC
  Filled 2016-07-01: qty 10

## 2016-07-01 MED ORDER — HYDROMORPHONE HCL 1 MG/ML IJ SOLN: 0.5000 mg | INTRAMUSCULAR | Status: DC | PRN

## 2016-07-01 MED ORDER — FENTANYL CITRATE (PF) 250 MCG/5ML IJ SOLN
INTRAMUSCULAR | Status: AC
  Filled 2016-07-01: qty 5

## 2016-07-01 MED ORDER — DEXAMETHASONE SODIUM PHOSPHATE 10 MG/ML IJ SOLN
INTRAMUSCULAR | Status: AC
  Filled 2016-07-01: qty 1

## 2016-07-01 MED ORDER — VANCOMYCIN HCL 1000 MG IV SOLR
INTRAVENOUS | Status: AC
  Filled 2016-07-01: qty 1000

## 2016-07-01 MED ORDER — BIOTIN 10 MG PO CAPS: 1.0000 | ORAL_CAPSULE | Freq: Every day | ORAL | Status: DC

## 2016-07-01 MED ORDER — POTASSIUM CITRATE ER 10 MEQ (1080 MG) PO TBCR: 20.0000 meq | EXTENDED_RELEASE_TABLET | Freq: Two times a day (BID) | ORAL | Status: DC

## 2016-07-01 MED ORDER — DEXTROSE 50 % IV SOLN
1.0000 | Freq: Once | INTRAVENOUS | Status: AC
  Administered 2016-07-01: 50 mL via INTRAVENOUS

## 2016-07-01 MED ORDER — CYCLOBENZAPRINE HCL 10 MG PO TABS: 10.0000 mg | ORAL_TABLET | Freq: Three times a day (TID) | ORAL | 0 refills | Status: DC | PRN

## 2016-07-01 MED ORDER — EPHEDRINE 5 MG/ML INJ
INTRAVENOUS | Status: AC
  Filled 2016-07-01: qty 10

## 2016-07-01 MED ORDER — LIDOCAINE HCL (CARDIAC) 20 MG/ML IV SOLN
INTRAVENOUS | Status: DC | PRN
  Administered 2016-07-01: 100 mg via INTRAVENOUS

## 2016-07-01 MED ORDER — ARTIFICIAL TEARS OP OINT
TOPICAL_OINTMENT | OPHTHALMIC | Status: AC
  Filled 2016-07-01: qty 3.5

## 2016-07-01 MED ORDER — EPHEDRINE SULFATE-NACL 50-0.9 MG/10ML-% IV SOSY
PREFILLED_SYRINGE | INTRAVENOUS | Status: DC | PRN
  Administered 2016-07-01 (×3): 10 mg via INTRAVENOUS

## 2016-07-01 MED ORDER — ENOXAPARIN SODIUM 30 MG/0.3ML ~~LOC~~ SOLN
30.0000 mg | SUBCUTANEOUS | Status: DC
  Filled 2016-07-01: qty 0.3

## 2016-07-01 MED ORDER — ROCURONIUM BROMIDE 50 MG/5ML IV SOSY
PREFILLED_SYRINGE | INTRAVENOUS | Status: AC
  Filled 2016-07-01: qty 10

## 2016-07-01 MED ORDER — HYDROMORPHONE HCL 1 MG/ML IJ SOLN
0.2500 mg | INTRAMUSCULAR | Status: DC | PRN
  Administered 2016-07-01: 0.5 mg via INTRAVENOUS

## 2016-07-01 MED ORDER — THROMBIN 5000 UNITS EX SOLR
CUTANEOUS | Status: DC | PRN
  Administered 2016-07-01: 11:00:00 via TOPICAL

## 2016-07-01 MED ORDER — THROMBIN 5000 UNITS EX SOLR
CUTANEOUS | Status: AC
  Filled 2016-07-01: qty 5000

## 2016-07-01 MED ORDER — CYCLOBENZAPRINE HCL 10 MG PO TABS
10.0000 mg | ORAL_TABLET | Freq: Three times a day (TID) | ORAL | Status: DC | PRN
  Administered 2016-07-01: 10 mg via ORAL
  Filled 2016-07-01: qty 1

## 2016-07-01 MED ORDER — LISINOPRIL 20 MG PO TABS: 10.0000 mg | ORAL_TABLET | Freq: Every day | ORAL | Status: DC

## 2016-07-01 MED ORDER — THROMBIN 5000 UNITS EX SOLR
CUTANEOUS | Status: DC | PRN
  Administered 2016-07-01 (×2): 5000 [IU] via TOPICAL

## 2016-07-01 MED ORDER — KETOROLAC TROMETHAMINE 30 MG/ML IJ SOLN
INTRAMUSCULAR | Status: DC | PRN
  Administered 2016-07-01: 30 mg via INTRAVENOUS

## 2016-07-01 MED ORDER — SERTRALINE HCL 50 MG PO TABS
50.0000 mg | ORAL_TABLET | Freq: Every day | ORAL | Status: DC
  Administered 2016-07-01: 50 mg via ORAL
  Filled 2016-07-01: qty 1

## 2016-07-01 MED ORDER — PHENOL 1.4 % MT LIQD: 1.0000 | OROMUCOSAL | Status: DC | PRN

## 2016-07-01 MED ORDER — ONDANSETRON HCL 4 MG PO TABS: 4.0000 mg | ORAL_TABLET | Freq: Four times a day (QID) | ORAL | Status: DC | PRN

## 2016-07-01 MED ORDER — ROCURONIUM BROMIDE 100 MG/10ML IV SOLN
INTRAVENOUS | Status: DC | PRN
  Administered 2016-07-01: 50 mg via INTRAVENOUS

## 2016-07-01 MED ORDER — 0.9 % SODIUM CHLORIDE (POUR BTL) OPTIME
TOPICAL | Status: DC | PRN
  Administered 2016-07-01: 1000 mL

## 2016-07-01 MED ORDER — MELOXICAM 15 MG PO TABS
15.0000 mg | ORAL_TABLET | Freq: Every day | ORAL | Status: DC
  Filled 2016-07-01: qty 1

## 2016-07-01 MED ORDER — SUGAMMADEX SODIUM 200 MG/2ML IV SOLN
INTRAVENOUS | Status: DC | PRN
  Administered 2016-07-01: 240 mg via INTRAVENOUS

## 2016-07-01 MED ORDER — MIDAZOLAM HCL 5 MG/5ML IJ SOLN
INTRAMUSCULAR | Status: DC | PRN
Start: 2016-07-01 — End: 2016-07-01
  Administered 2016-07-01: 2 mg via INTRAVENOUS

## 2016-07-01 MED ORDER — DOXEPIN HCL 100 MG PO CAPS
100.0000 mg | ORAL_CAPSULE | Freq: Every day | ORAL | Status: DC
  Filled 2016-07-01: qty 1

## 2016-07-01 MED ORDER — PROPOFOL 10 MG/ML IV BOLUS
INTRAVENOUS | Status: AC
  Filled 2016-07-01: qty 20

## 2016-07-01 MED ORDER — SODIUM CHLORIDE 0.9% FLUSH: 3.0000 mL | INTRAVENOUS | Status: DC | PRN

## 2016-07-01 MED ORDER — ROCURONIUM BROMIDE 50 MG/5ML IV SOSY
PREFILLED_SYRINGE | INTRAVENOUS | Status: AC
  Filled 2016-07-01: qty 5

## 2016-07-01 MED ORDER — FAMOTIDINE 20 MG PO TABS: 10.0000 mg | ORAL_TABLET | Freq: Two times a day (BID) | ORAL | Status: DC

## 2016-07-01 MED ORDER — PHENYLEPHRINE 40 MCG/ML (10ML) SYRINGE FOR IV PUSH (FOR BLOOD PRESSURE SUPPORT)
PREFILLED_SYRINGE | INTRAVENOUS | Status: DC | PRN
  Administered 2016-07-01 (×3): 120 ug via INTRAVENOUS
  Administered 2016-07-01: 40 ug via INTRAVENOUS

## 2016-07-01 MED ORDER — PROPOFOL 10 MG/ML IV BOLUS
INTRAVENOUS | Status: DC | PRN
  Administered 2016-07-01: 200 mg via INTRAVENOUS

## 2016-07-01 MED ORDER — CEFAZOLIN IN D5W 1 GM/50ML IV SOLN
1.0000 g | Freq: Three times a day (TID) | INTRAVENOUS | Status: DC
  Administered 2016-07-01: 1 g via INTRAVENOUS
  Filled 2016-07-01: qty 50

## 2016-07-01 MED ORDER — FENTANYL CITRATE (PF) 250 MCG/5ML IJ SOLN
INTRAMUSCULAR | Status: AC
Start: 2016-07-01 — End: 2016-07-01
  Filled 2016-07-01: qty 5

## 2016-07-01 MED ORDER — KETOROLAC TROMETHAMINE 15 MG/ML IJ SOLN
30.0000 mg | Freq: Four times a day (QID) | INTRAMUSCULAR | Status: DC
  Administered 2016-07-01 (×2): 30 mg via INTRAVENOUS
  Filled 2016-07-01: qty 2

## 2016-07-01 MED ORDER — GABAPENTIN 600 MG PO TABS
600.0000 mg | ORAL_TABLET | Freq: Three times a day (TID) | ORAL | Status: DC
  Administered 2016-07-01: 600 mg via ORAL
  Filled 2016-07-01: qty 1

## 2016-07-01 MED ORDER — HYDROCHLOROTHIAZIDE 25 MG PO TABS: 25.0000 mg | ORAL_TABLET | Freq: Every day | ORAL | Status: DC

## 2016-07-01 MED ORDER — HYDROCODONE-ACETAMINOPHEN 5-325 MG PO TABS
1.0000 | ORAL_TABLET | ORAL | Status: DC | PRN
  Administered 2016-07-01: 2 via ORAL
  Filled 2016-07-01: qty 2

## 2016-07-01 MED ORDER — PHENYLEPHRINE HCL 10 MG/ML IJ SOLN
INTRAVENOUS | Status: DC | PRN
  Administered 2016-07-01: 25 ug/min via INTRAVENOUS

## 2016-07-01 MED ORDER — CHLORHEXIDINE GLUCONATE CLOTH 2 % EX PADS: 6.0000 | MEDICATED_PAD | Freq: Once | CUTANEOUS | Status: DC

## 2016-07-01 MED ORDER — HYDROMORPHONE HCL 1 MG/ML IJ SOLN
INTRAMUSCULAR | Status: AC
  Filled 2016-07-01: qty 0.5

## 2016-07-01 MED ORDER — DEXAMETHASONE SODIUM PHOSPHATE 10 MG/ML IJ SOLN
10.0000 mg | INTRAMUSCULAR | Status: AC
  Administered 2016-07-01: 10 mg via INTRAVENOUS

## 2016-07-01 MED ORDER — LACTATED RINGERS IV SOLN
INTRAVENOUS | Status: DC
  Administered 2016-07-01 (×2): via INTRAVENOUS

## 2016-07-01 MED ORDER — VANCOMYCIN HCL 1000 MG IV SOLR
INTRAVENOUS | Status: DC | PRN
  Administered 2016-07-01: 1000 mg

## 2016-07-01 MED ORDER — LIDOCAINE 2% (20 MG/ML) 5 ML SYRINGE
INTRAMUSCULAR | Status: AC
  Filled 2016-07-01: qty 5

## 2016-07-01 MED ORDER — PRAZOSIN HCL 1 MG PO CAPS
3.0000 mg | ORAL_CAPSULE | Freq: Every day | ORAL | Status: DC
  Filled 2016-07-01: qty 1

## 2016-07-01 MED ORDER — THROMBIN 5000 UNITS EX SOLR
CUTANEOUS | Status: AC
  Filled 2016-07-01: qty 10000

## 2016-07-01 MED ORDER — SODIUM CHLORIDE 0.9% FLUSH: 3.0000 mL | Freq: Two times a day (BID) | INTRAVENOUS | Status: DC

## 2016-07-01 MED ORDER — INSULIN ASPART 100 UNIT/ML ~~LOC~~ SOLN
0.0000 [IU] | Freq: Three times a day (TID) | SUBCUTANEOUS | Status: DC
  Administered 2016-07-01: 4 [IU] via SUBCUTANEOUS

## 2016-07-01 MED ORDER — PANTOPRAZOLE SODIUM 40 MG PO TBEC
80.0000 mg | DELAYED_RELEASE_TABLET | Freq: Every day | ORAL | Status: DC
  Filled 2016-07-01: qty 2

## 2016-07-01 MED ORDER — BUPIVACAINE HCL (PF) 0.25 % IJ SOLN
INTRAMUSCULAR | Status: AC
  Filled 2016-07-01: qty 30

## 2016-07-01 MED ORDER — INSULIN NPH (HUMAN) (ISOPHANE) 100 UNIT/ML ~~LOC~~ SUSP: 25.0000 [IU] | Freq: Two times a day (BID) | SUBCUTANEOUS | Status: DC

## 2016-07-01 MED ORDER — LEVOTHYROXINE SODIUM 100 MCG PO TABS: 50.0000 ug | ORAL_TABLET | Freq: Every day | ORAL | Status: DC

## 2016-07-01 MED ORDER — FUROSEMIDE 40 MG PO TABS
40.0000 mg | ORAL_TABLET | Freq: Every day | ORAL | Status: DC
  Administered 2016-07-01: 40 mg via ORAL
  Filled 2016-07-01: qty 1

## 2016-07-01 MED ORDER — FENTANYL CITRATE (PF) 100 MCG/2ML IJ SOLN
INTRAMUSCULAR | Status: DC | PRN
  Administered 2016-07-01: 150 ug via INTRAVENOUS

## 2016-07-01 MED ORDER — BUPIVACAINE HCL (PF) 0.25 % IJ SOLN
INTRAMUSCULAR | Status: DC | PRN
Start: 2016-07-01 — End: 2016-07-01
  Administered 2016-07-01: 20 mL

## 2016-07-01 MED ORDER — ONDANSETRON HCL 4 MG/2ML IJ SOLN: 4.0000 mg | Freq: Four times a day (QID) | INTRAMUSCULAR | Status: DC | PRN

## 2016-07-01 MED ORDER — INSULIN NPH (HUMAN) (ISOPHANE) 100 UNIT/ML ~~LOC~~ SUSP
65.0000 [IU] | Freq: Every day | SUBCUTANEOUS | Status: DC
  Filled 2016-07-01: qty 10

## 2016-07-01 MED ORDER — DEXTROSE 50 % IV SOLN
25.0000 mL | Freq: Once | INTRAVENOUS | Status: AC
  Administered 2016-07-01: 25 mL via INTRAVENOUS
  Filled 2016-07-01: qty 50

## 2016-07-01 MED ORDER — ADULT MULTIVITAMIN W/MINERALS CH
1.0000 | ORAL_TABLET | Freq: Every evening | ORAL | Status: DC
  Administered 2016-07-01: 1 via ORAL
  Filled 2016-07-01: qty 1

## 2016-07-01 MED ORDER — HYDROCODONE-ACETAMINOPHEN 5-325 MG PO TABS: 1.0000 | ORAL_TABLET | ORAL | 0 refills | Status: DC | PRN

## 2016-07-01 MED ORDER — COENZYME Q10 120 MG PO CAPS: 120.0000 mg | ORAL_CAPSULE | Freq: Every day | ORAL | Status: DC

## 2016-07-01 MED ORDER — SODIUM CHLORIDE 0.9 % IR SOLN
Status: DC | PRN
  Administered 2016-07-01: 10:00:00

## 2016-07-01 MED ORDER — KETOROLAC TROMETHAMINE 30 MG/ML IJ SOLN
INTRAMUSCULAR | Status: AC
  Filled 2016-07-01: qty 1

## 2016-07-01 MED ORDER — LAMOTRIGINE 200 MG PO TABS: 200.0000 mg | ORAL_TABLET | Freq: Every day | ORAL | Status: DC

## 2016-07-01 MED ORDER — SUCCINYLCHOLINE CHLORIDE 200 MG/10ML IV SOSY
PREFILLED_SYRINGE | INTRAVENOUS | Status: DC | PRN
  Administered 2016-07-01: 120 mg via INTRAVENOUS

## 2016-07-01 MED ORDER — DEXTROSE 50 % IV SOLN
INTRAVENOUS | Status: AC
  Administered 2016-07-01: 25 mL via INTRAVENOUS
  Filled 2016-07-01: qty 50

## 2016-07-01 SURGICAL SUPPLY — 47 items
BAG DECANTER FOR FLEXI CONT (MISCELLANEOUS) ×2 IMPLANT
BENZOIN TINCTURE PRP APPL 2/3 (GAUZE/BANDAGES/DRESSINGS) ×2 IMPLANT
BLADE CLIPPER SURG (BLADE) IMPLANT
BUR CUTTER 7.0 ROUND (BURR) ×2 IMPLANT
CANISTER SUCT 3000ML PPV (MISCELLANEOUS) ×2 IMPLANT
CARTRIDGE OIL MAESTRO DRILL (MISCELLANEOUS) ×1 IMPLANT
DECANTER SPIKE VIAL GLASS SM (MISCELLANEOUS) ×2 IMPLANT
DERMABOND ADVANCED (GAUZE/BANDAGES/DRESSINGS) ×1
DERMABOND ADVANCED .7 DNX12 (GAUZE/BANDAGES/DRESSINGS) ×1 IMPLANT
DIFFUSER DRILL AIR PNEUMATIC (MISCELLANEOUS) ×2 IMPLANT
DRAPE HALF SHEET 40X57 (DRAPES) IMPLANT
DRAPE LAPAROTOMY 100X72X124 (DRAPES) ×2 IMPLANT
DRAPE MICROSCOPE LEICA (MISCELLANEOUS) ×2 IMPLANT
DRAPE POUCH INSTRU U-SHP 10X18 (DRAPES) ×2 IMPLANT
DRAPE SURG 17X23 STRL (DRAPES) ×4 IMPLANT
DRSG OPSITE POSTOP 4X6 (GAUZE/BANDAGES/DRESSINGS) ×2 IMPLANT
DURAPREP 26ML APPLICATOR (WOUND CARE) ×2 IMPLANT
ELECT REM PT RETURN 9FT ADLT (ELECTROSURGICAL) ×2
ELECTRODE REM PT RTRN 9FT ADLT (ELECTROSURGICAL) ×1 IMPLANT
GAUZE SPONGE 4X4 12PLY STRL (GAUZE/BANDAGES/DRESSINGS) ×2 IMPLANT
GAUZE SPONGE 4X4 16PLY XRAY LF (GAUZE/BANDAGES/DRESSINGS) IMPLANT
GLOVE ECLIPSE 9.0 STRL (GLOVE) ×2 IMPLANT
GLOVE EXAM NITRILE LRG STRL (GLOVE) IMPLANT
GLOVE EXAM NITRILE XL STR (GLOVE) IMPLANT
GLOVE EXAM NITRILE XS STR PU (GLOVE) IMPLANT
GOWN STRL REUS W/ TWL LRG LVL3 (GOWN DISPOSABLE) IMPLANT
GOWN STRL REUS W/ TWL XL LVL3 (GOWN DISPOSABLE) ×3 IMPLANT
GOWN STRL REUS W/TWL 2XL LVL3 (GOWN DISPOSABLE) IMPLANT
GOWN STRL REUS W/TWL LRG LVL3 (GOWN DISPOSABLE)
GOWN STRL REUS W/TWL XL LVL3 (GOWN DISPOSABLE) ×3
HEMOSTAT POWDER SURGIFOAM 1G (HEMOSTASIS) ×2 IMPLANT
KIT BASIN OR (CUSTOM PROCEDURE TRAY) ×2 IMPLANT
KIT ROOM TURNOVER OR (KITS) ×2 IMPLANT
NEEDLE HYPO 22GX1.5 SAFETY (NEEDLE) ×2 IMPLANT
NEEDLE SPNL 22GX3.5 QUINCKE BK (NEEDLE) ×2 IMPLANT
NS IRRIG 1000ML POUR BTL (IV SOLUTION) ×2 IMPLANT
OIL CARTRIDGE MAESTRO DRILL (MISCELLANEOUS) ×2
PACK LAMINECTOMY NEURO (CUSTOM PROCEDURE TRAY) ×2 IMPLANT
PAD ARMBOARD 7.5X6 YLW CONV (MISCELLANEOUS) ×6 IMPLANT
RUBBERBAND STERILE (MISCELLANEOUS) ×4 IMPLANT
SPONGE SURGIFOAM ABS GEL SZ50 (HEMOSTASIS) ×2 IMPLANT
STRIP CLOSURE SKIN 1/2X4 (GAUZE/BANDAGES/DRESSINGS) ×2 IMPLANT
SUT VIC AB 2-0 CT1 18 (SUTURE) ×2 IMPLANT
SUT VIC AB 3-0 SH 8-18 (SUTURE) ×2 IMPLANT
TOWEL GREEN STERILE (TOWEL DISPOSABLE) ×2 IMPLANT
TOWEL GREEN STERILE FF (TOWEL DISPOSABLE) ×2 IMPLANT
WATER STERILE IRR 1000ML POUR (IV SOLUTION) ×2 IMPLANT

## 2016-07-01 NOTE — H&P (Signed)
Brittany Acosta is an 66 y.o. female.   Chief Complaint: Back and left leg pain HPI: 66 year old female with severe back and left lower chamois pain, paresthesias and weakness. Workup demonstrates evidence of a large compressive synovial cyst at L4-5 causing severe compression of the thecal sac and left L5 nerve root. Patient presents now for decompressive laminotomy and resection of synovial cyst.  Past Medical History:  Diagnosis Date  . ALLERGIC RHINITIS   . Arthritis   . BIPOLAR DISORDER UNSPECIFIED    depression  . Cancer ([place])    skin cancer on left leg  . Chronic diastolic heart failure ([place])   . Depression   . FIBROMYALGIA   . GERD (gastroesophageal reflux disease)   . GOITER, MULTINODULAR   . Headache   . History of gout    "haven't had it in several years" (02/08/2012)  . History of kidney stones   . History of wrist fracture    rt wrist  . HYPERCHOLESTEROLEMIA   . Hypertension   . HYPOTHYROIDISM   . Iron deficiency anemia   . Kidney stones    sees urologist @ [facility]  . Morbid obesity ([place])   . Neuropathy due to secondary diabetes ([place])   . PONV (postoperative nausea and vomiting)   . PTSD (post-traumatic stress disorder)   . RBBB   . Sleep apnea 7/14   mild osa-did not need cpap -dr Gwenette Greet  . Type II diabetes mellitus ([place])   . Wears glasses     Past Surgical History:  Procedure Laterality Date  . ABDOMINAL HYSTERECTOMY  1976  . BREAST SURGERY    . CARDIAC CATHETERIZATION  2001   sees Dr Peter Martinique  . CATARACT EXTRACTION  2014  . CATARACT EXTRACTION, BILATERAL  02/2011   epps  . CHOLECYSTECTOMY  1985  . EXCISIONAL HEMORRHOIDECTOMY     "dr cut out in his office" (02/08/2012)  . INCISIONAL BREAST BIOPSY  2000   right  . JOINT REPLACEMENT     rt knee  . KNEE ARTHROSCOPY  08/2009   right  . KNEE ARTHROSCOPY WITH MEDIAL MENISECTOMY     left  . Left Cystoscopy   1990  . LEFT OOPHORECTOMY  1980  . Lithotripsy (L) Kidney  1997  . MASTECTOMY     left  side  . PARTIAL MASTECTOMY WITH NEEDLE LOCALIZATION Left 09/01/2012   Procedure: PARTIAL MASTECTOMY WITH NEEDLE LOCALIZATION;  Surgeon: Adin Hector, MD;  Location: [facility];  Service: General;  Laterality: Left;  . Percitania stone removed (L) Kidney  1992  . Right nasal surgery  08/1988  . Right sinus removed  08/1989   tooth partial  . ROTATOR CUFF REPAIR  2013   right shoulder x 2  . SHOULDER ARTHROSCOPY WITH ROTATOR CUFF REPAIR AND SUBACROMIAL DECOMPRESSION Left 08/20/2013   Procedure: SHOULDER ARTHROSCOPY  AND SUBACROMIAL DECOMPRESSION;  Surgeon: Nita Sells, MD;  Location: [facility];  Service: Orthopedics;  Laterality: Left;  Left shoulder arthroscopy, debridement, subacromial decompression, distal clavical resection  . THYROIDECTOMY  04/22/2011   Procedure: THYROIDECTOMY;  Surgeon: Onnie Graham, MD;  Location: [facility];  Service: ENT;  Laterality: N/A;  TOTAL THYROIDECOTMY  . TOTAL KNEE ARTHROPLASTY  06/18/2011   Procedure: TOTAL KNEE ARTHROPLASTY;  Surgeon: Kerin Salen, MD;  Location: [facility];  Service: Orthopedics;  Laterality: Left;  DEPUY  . TOTAL KNEE ARTHROPLASTY  02/07/2012   Procedure: TOTAL KNEE ARTHROPLASTY;  Surgeon: Kerin Salen, MD;  Location: [facility];  Service: Orthopedics;  Laterality: Right;  . TUBAL LIGATION  1972    Family History  Problem Relation Age of Onset  . Hypertension Mother   . Lung cancer Mother     non-smoker  . Diabetes Father   . Hypertension Father   . Hyperlipidemia Father   . Heart attack Other   . Coronary artery disease Other   . Hypertension Sister   . Hypertension Brother   . Hypertension Brother   . Hypertension Brother   . Hypertension Sister   . Hypertension Sister   . Diabetes Daughter    Social History:  reports that she quit smoking about 33 years ago. Her smoking use included Cigarettes. She has a 6.00 pack-year smoking history. She has never used smokeless tobacco. She reports that she does not drink  alcohol or use drugs.  Allergies: No Known Allergies  Medications Prior to Admission  Medication Sig Dispense Refill  . Biotin 10 MG CAPS Take 1 capsule by mouth daily.    . Coenzyme Q10 120 MG CAPS Take 120 mg by mouth daily.    Marland Kitchen doxepin (SINEQUAN) 100 MG capsule Take 100 mg by mouth at bedtime.    Marland Kitchen estradiol (ESTRACE) 0.5 MG tablet Take 1 tablet (0.5 mg total) by mouth daily. 90 tablet 3  . furosemide (LASIX) 40 MG tablet Take 1 tablet (40 mg total) by mouth daily. 90 tablet 3  . gabapentin (NEURONTIN) 600 MG tablet Take 600-900 mg by mouth 4 (four) times daily. 657m three times daily 9081mat bedtime    . hydrochlorothiazide (HYDRODIURIL) 25 MG tablet Take 1 tablet (25 mg total) by mouth every morning. (Patient taking differently: Take 25 mg by mouth daily. ) 90 tablet 3  . insulin NPH Human (HUMULIN N,NOVOLIN N) 100 UNIT/ML injection Inject 25-65 Units into the skin 2 (two) times daily before a meal.    . lamoTRIgine (LAMICTAL) 200 MG tablet Take 1 tablet (200 mg total) by mouth daily at 12 noon. 90 tablet 3  . levothyroxine (SYNTHROID, LEVOTHROID) 50 MCG tablet Take 1 tablet (50 mcg total) by mouth daily. --- Office visit needed for further refills 90 tablet 0  . lisinopril (PRINIVIL,ZESTRIL) 10 MG tablet Take 1 tablet (10 mg total) by mouth daily. 90 tablet 3  . meloxicam (MOBIC) 15 MG tablet TAKE 1 TABLET EVERY DAY 90 tablet 0  . Multiple Vitamin (MULTIVITAMIN WITH MINERALS) TABS Take 1 tablet by mouth every evening.    . Marland Kitchenmeprazole (PRILOSEC) 20 MG capsule Take 1 capsule (20 mg total) by mouth daily. Take 30 minutes before a meal (Patient taking differently: Take 20 mg by mouth daily before lunch. Take 30 minutes before a meal) 90 capsule 3  . oxybutynin (DITROPAN-XL) 10 MG 24 hr tablet Take 1 tablet (10 mg total) by mouth every evening. 90 tablet 3  . potassium citrate (UROCIT-K) 10 MEQ (1080 MG) SR tablet TAKE 2 TABLETS TWICE DAILY (Patient taking differently: takes 4 tablets in  the evening) 360 tablet 0  . prazosin (MINIPRESS) 1 MG capsule Take 3 capsules (3 mg total) by mouth at bedtime. 270 capsule 3  . ranitidine (ZANTAC) 150 MG tablet Take 1 tablet (150 mg total) by mouth 2 (two) times daily. 180 tablet 3  . sertraline (ZOLOFT) 50 MG tablet Take 1 tablet (50 mg total) by mouth daily. 90 tablet 3  . Blood Glucose Monitoring Suppl (ACCU-CHEK AVIVA PLUS) w/Device KIT Use to check blood sugar 2 times per  day. (Patient not taking: Reported on 06/01/2016) 1 kit 2  . cyclobenzaprine (FLEXERIL) 10 MG tablet Take 1 tablet (10 mg total) by mouth 2 (two) times daily as needed for muscle spasms. (Patient not taking: Reported on 06/24/2016) 20 tablet 0  . glucose blood (ACCU-CHEK AVIVA) test strip Use to check blood sugar two times per day. 200 each 2  . ibuprofen (ADVIL,MOTRIN) 600 MG tablet Take 1 tablet (600 mg total) by mouth every 6 (six) hours as needed. (Patient not taking: Reported on 06/24/2016) 30 tablet 0  . Insulin Syringe-Needle U-100 (INSULIN SYRINGE 1CC/31GX5/16") 31G X 5/16" 1 ML MISC Use to inject insulin two times per day. 200 each 2  . Lancets (ACCU-CHEK MULTICLIX) lancets Use to check blood sugar two time per day. 200 each 2  . oxyCODONE-acetaminophen (PERCOCET/ROXICET) 5-325 MG tablet Take 1-2 tablets by mouth every 6 (six) hours as needed for severe pain. (Patient not taking: Reported on 06/24/2016) 15 tablet 0  . traMADol (ULTRAM) 50 MG tablet Take 1 tablet (50 mg total) by mouth every 12 (twelve) hours as needed. (Patient not taking: Reported on 06/24/2016) 30 tablet 0    Results for orders placed or performed during the hospital encounter of 07/01/16 (from the past 48 hour(s))  Glucose, capillary     Status: None   Collection Time: 07/01/16  8:43 AM  Result Value Ref Range   Glucose-Capillary 72 65 - 99 mg/dL  Glucose, capillary     Status: None   Collection Time: 07/01/16  9:30 AM  Result Value Ref Range   Glucose-Capillary 77 65 - 99 mg/dL   No results  found.  Pertinent items noted in HPI and remainder of comprehensive ROS otherwise negative.  Blood pressure (!) 149/58, pulse 80, temperature 98.1 F (36.7 C), temperature source Oral, resp. rate 18, height 5' 2.5" (1.588 m), weight 117.9 kg (260 lb), SpO2 100 %.  Patient is awake and alert. She is oriented and appropriate. Her speech is fluent. Her judgment and insight are intact. Overall she is overweight but appears to be in no distress aside from her leg pain. Examination of her head ears eyes and throat is unremarkable. Chest and abdomen are obese but otherwise benign. Extremities are free from injury deformity. Motor examination of the extremities reveals some mild weakness of her left anterior tibialis and extensor hallucis longus. She also has some mild weakness of her right anterior tibialis and extensor hallucis longus. Straight leg raising is positive bilaterally. Sensory examination was decreased sensation to light touch in her left L5 and S1 dermatome. No evidence of long track signs. 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 Plan left L4-5 decompressive laminotomy and foraminotomies followed by resection of adherent synovial cyst. Risks and benefits of been explained. Patient wishes to proceed.  Rinoa Garramone A 07/01/2016, 9:49 AM

## 2016-07-01 NOTE — Progress Notes (Signed)
Hypoglycemic Event  CBG: 72  Treatment: D50 IV 25 mL  Symptoms: None  Follow-up CBG: [id number] CBG Result:77  Possible Reasons for Event: inadequate meal intake   Comments/MD notified:fitzgerald, w    Schonewitz, Eulis Canner

## 2016-07-01 NOTE — Discharge Instructions (Signed)

## 2016-07-01 NOTE — Progress Notes (Signed)
Hypoglycemic Event  CBG: 77  Treatment: D50 IV 25 mL  Symptoms: None  Follow-up CBG: Time:1000 CBG Result: 89  Possible Reasons for Event: Inadequate meal intake  Comments/MD notified:fitzgerald, w     Schonewitz, Eulis Canner

## 2016-07-01 NOTE — Discharge Summary (Signed)
Physician Discharge Summary  Patient ID: Brittany Acosta MRN: [id number] DOB/AGE: 66-Aug-1952 65 y.o.  Admit date: 07/01/2016 Discharge date: 07/01/2016  Admission Diagnoses:  Discharge Diagnoses:  Active Problems:   Synovial cyst of lumbar spine   Discharged Condition: good  Hospital Course: The patient is minutes the hospital where she underwent an uncomplicated laminotomy and resection of large synovial cyst at L4-5. Postoperative she is doing very well. Preoperative back and left lower 20 pain are resolved. She's now able to stand and walk were rash before she could not. She's having minimal incisional discomfort. She feels ready for discharge home.  Consults:   Significant Diagnostic Studies:   Treatments:   Discharge Exam: Blood pressure (!) 119/45, pulse 89, temperature 98.1 F (36.7 C), resp. rate 18, height 5' 2.5" (1.588 m), weight 117.9 kg (260 lb), SpO2 94 %. Awake and alert. Oriented and appropriate  Nerve function intact. Motor and sensory function extremities normal. Wound clean and dry. Chest and abdomen benign.  Disposition: 01-Home or Self Care   Allergies as of 07/01/2016   No Known Allergies     Medication List    TAKE these medications   ACCU-CHEK AVIVA PLUS w/Device Kit Use to check blood sugar 2 times per day.   accu-chek multiclix lancets Use to check blood sugar two time per day.   Biotin 10 MG Caps Take 1 capsule by mouth daily.   Coenzyme Q10 120 MG Caps Take 120 mg by mouth daily.   cyclobenzaprine 10 MG tablet Commonly known as:  FLEXERIL Take 1 tablet (10 mg total) by mouth 3 (three) times daily as needed for muscle spasms. What changed:  when to take this   doxepin 100 MG capsule Commonly known as:  SINEQUAN Take 100 mg by mouth at bedtime.   estradiol 0.5 MG tablet Commonly known as:  ESTRACE Take 1 tablet (0.5 mg total) by mouth daily.   furosemide 40 MG tablet Commonly known as:  LASIX Take 1 tablet (40 mg total) by mouth  daily.   gabapentin 600 MG tablet Commonly known as:  NEURONTIN Take 600-900 mg by mouth 4 (four) times daily. 65m three times daily 9062mat bedtime   glucose blood test strip Commonly known as:  ACCU-CHEK AVIVA Use to check blood sugar two times per day.   hydrochlorothiazide 25 MG tablet Commonly known as:  HYDRODIURIL Take 1 tablet (25 mg total) by mouth every morning. What changed:  when to take this   HYDROcodone-acetaminophen 5-325 MG tablet Commonly known as:  NORCO/VICODIN Take 1-2 tablets by mouth every 4 (four) hours as needed (breakthrough pain).   ibuprofen 600 MG tablet Commonly known as:  ADVIL,MOTRIN Take 1 tablet (600 mg total) by mouth every 6 (six) hours as needed.   insulin NPH Human 100 UNIT/ML injection Commonly known as:  HUMULIN N,NOVOLIN N Inject 25-65 Units into the skin 2 (two) times daily before a meal.   INSULIN SYRINGE 1CC/31GX5/16" 31G X 5/16" 1 ML Misc Use to inject insulin two times per day.   lamoTRIgine 200 MG tablet Commonly known as:  LAMICTAL Take 1 tablet (200 mg total) by mouth daily at 12 noon.   levothyroxine 50 MCG tablet Commonly known as:  SYNTHROID, LEVOTHROID Take 1 tablet (50 mcg total) by mouth daily. --- Office visit needed for further refills   lisinopril 10 MG tablet Commonly known as:  PRINIVIL,ZESTRIL Take 1 tablet (10 mg total) by mouth daily.   meloxicam 15 MG tablet Commonly known as:  MOBIC TAKE 1 TABLET EVERY DAY   multivitamin with minerals Tabs tablet Take 1 tablet by mouth every evening.   omeprazole 20 MG capsule Commonly known as:  PRILOSEC Take 1 capsule (20 mg total) by mouth daily. Take 30 minutes before a meal What changed:  when to take this  additional instructions   oxybutynin 10 MG 24 hr tablet Commonly known as:  DITROPAN-XL Take 1 tablet (10 mg total) by mouth every evening.   oxyCODONE-acetaminophen 5-325 MG tablet Commonly known as:  PERCOCET/ROXICET Take 1-2 tablets by mouth  every 6 (six) hours as needed for severe pain.   potassium citrate 10 MEQ (1080 MG) SR tablet Commonly known as:  UROCIT-K TAKE 2 TABLETS TWICE DAILY What changed:  See the new instructions.   prazosin 1 MG capsule Commonly known as:  MINIPRESS Take 3 capsules (3 mg total) by mouth at bedtime.   ranitidine 150 MG tablet Commonly known as:  ZANTAC Take 1 tablet (150 mg total) by mouth 2 (two) times daily.   sertraline 50 MG tablet Commonly known as:  ZOLOFT Take 1 tablet (50 mg total) by mouth daily.   traMADol 50 MG tablet Commonly known as:  ULTRAM Take 1 tablet (50 mg total) by mouth every 12 (twelve) hours as needed.        Signed: Victor Granados A 07/01/2016, 5:10 PM

## 2016-07-01 NOTE — Transfer of Care (Signed)
Immediate Anesthesia Transfer of Care Note  Patient: Brittany Acosta  Procedure(s) Performed: Procedure(s): Laminectomy for synovial cyst - left - Lumbar four-five (Left)  Patient Location: PACU  Anesthesia Type:General  Level of Consciousness: awake, oriented, patient cooperative and lethargic  Airway & Oxygen Therapy: Patient Spontanous Breathing and Patient connected to face mask oxygen  Post-op Assessment: Report given to RN, Post -op Vital signs reviewed and stable and Patient moving all extremities X 4  Post vital signs: Reviewed and stable  BP 121/93 HR 84 RR 14 SpO2 100% on 4L FM Resting comfortably, maintains good airway, denies pain.   Last Vitals:  Vitals:   07/01/16 0837  BP: (!) 149/58  Pulse: 80  Resp: 18  Temp: 36.7 C    Last Pain:  Vitals:   07/01/16 0837  TempSrc: Oral         Complications: No apparent anesthesia complications

## 2016-07-01 NOTE — Anesthesia Procedure Notes (Signed)
Procedure Name: Intubation Date/Time: 07/01/2016 10:26 AM Performed by: Willeen Cass P Pre-anesthesia Checklist: Patient identified, Emergency Drugs available, Suction available and Patient being monitored Patient Re-evaluated:Patient Re-evaluated prior to inductionOxygen Delivery Method: Circle System Utilized Preoxygenation: Pre-oxygenation with 100% oxygen Intubation Type: IV induction Ventilation: Mask ventilation without difficulty Laryngoscope Size: Glidescope and 3 Grade View: Grade I Tube type: Oral Tube size: 7.0 mm Number of attempts: 1 Airway Equipment and Method: Stylet,  Video-laryngoscopy and Rigid stylet Placement Confirmation: ETT inserted through vocal cords under direct vision,  positive ETCO2 and breath sounds checked- equal and bilateral Secured at: 22 cm Tube secured with: Tape Dental Injury: Teeth and Oropharynx as per pre-operative assessment  Difficulty Due To: Difficulty was anticipated and Difficult Airway- due to reduced neck mobility Future Recommendations: Recommend- induction with short-acting agent, and alternative techniques readily available

## 2016-07-01 NOTE — Op Note (Signed)
Date of procedure: 07/01/2016  Date of dictation: Same  Service: Neurosurgery  Preoperative diagnosis: Left L4-L5 adherent synovial cyst with stenosis and radiculopathy  Postoperative diagnosis: Same  Procedure Name: Left L4-5 laminotomy and resection of adherent synovial cyst, microdissection  Surgeon:Nakiesha Rumsey A.Melroy Bougher, M.D.  Asst. Surgeon: Ronnald Ramp  Anesthesia: General  Indication: 66 year old female with severe back and left lower chamois pain. Seizures and weakness. Workup demonstrates evidence of a very large left-sided L4-5 synovial cyst with severe spinal stenosis. Patient presents now for laminotomy and resection of cyst.  Operative note: After induction of anesthesia, patient position prone onto Wilson frame and a properly padded. Lumbar region prepped and draped sterilely. Incision made overlying L4-5. Dissection performed on the left. Retractor placed. X-ray taken. Level confirmed. Laminotomy performed using high-speed drill and Kerrison rongeurs to remove the inferior aspect lamina of L4, the medial aspect the L4-5 facet joint, and the superior rim the L5 lamina. Ligament flavum was elevated and resected. The synovial cyst was encountered. This is debulked internally first. This was carefully dissected along the edges. Microscope front field using microdissection of the synovial cyst. Using microdissection the synovial cyst was carefully peeled away from the underlying thecal sac and left L5 nerve root. All elements the synovial cyst were resected. There was no evidence of injury to the thecal sac or nerve roots. The L4 and L5 nerve root were visualized and found be well decompressed. The wound is then irrigated MI solution. Gelfoam was placed topically for hemostasis. Wounds and close in layers with Vicryl sutures. Steri-Strips and sterile dressing were applied. No apparent complications. Patient tolerated the procedure well and she returns to the recovery room postop.

## 2016-07-01 NOTE — Anesthesia Preprocedure Evaluation (Addendum)
Anesthesia Evaluation  Patient identified by MRN, date of birth, ID band Patient awake    Reviewed: Allergy & Precautions, H&P , NPO status , Patient's Chart, lab work & pertinent test results  History of Anesthesia Complications (+) PONV  Airway Mallampati: I  TM Distance: >3 FB Neck ROM: Full    Dental no notable dental hx. (+) Teeth Intact, Dental Advisory Given, Implants, Partial Upper   Pulmonary sleep apnea , former smoker,    Pulmonary exam normal        Cardiovascular hypertension, Normal cardiovascular exam     Neuro/Psych  Headaches, Depression Bipolar Disorder negative psych ROS   GI/Hepatic Neg liver ROS, GERD  Medicated and Controlled,  Endo/Other  diabetes, Type 1, Insulin DependentHypothyroidism Morbid obesity  Renal/GU negative Renal ROS  negative genitourinary   Musculoskeletal  (+) Arthritis ,   Abdominal   Peds  Hematology negative hematology ROS (+)   Anesthesia Other Findings   Reproductive/Obstetrics negative OB ROS                            Anesthesia Physical Anesthesia Plan  ASA: III  Anesthesia Plan: General   Post-op Pain Management:    Induction: Intravenous  Airway Management Planned: Oral ETT  Additional Equipment:   Intra-op Plan:   Post-operative Plan: Extubation in OR  Informed Consent: I have reviewed the patients History and Physical, chart, labs and discussed the procedure including the risks, benefits and alternatives for the proposed anesthesia with the patient or authorized representative who has indicated his/her understanding and acceptance.   Dental advisory given  Plan Discussed with: CRNA  Anesthesia Plan Comments:         Anesthesia Quick Evaluation

## 2016-07-01 NOTE — Brief Op Note (Signed)
07/01/2016  11:37 AM  PATIENT:  Sanda Klein Mangan  66 y.o. female  PRE-OPERATIVE DIAGNOSIS:  Synovial cyst  POST-OPERATIVE DIAGNOSIS:  * No post-op diagnosis entered *  PROCEDURE:  Procedure(s): Laminectomy for synovial cyst - left - Lumbar four-five (Left)  SURGEON:  Surgeon(s) and Role:    * Earnie Larsson, MD - Primary  PHYSICIAN ASSISTANT:   ASSISTANTS: Ronnald Ramp   ANESTHESIA:   general  EBL:  Total I/O In: 1200 [I.V.:1200] Out: 200 [Blood:200]  BLOOD ADMINISTERED:none  DRAINS: none   LOCAL MEDICATIONS USED:  MARCAINE     SPECIMEN:  No Specimen  DISPOSITION OF SPECIMEN:  N/A  COUNTS:  YES  TOURNIQUET:  * No tourniquets in log *  DICTATION: .Dragon Dictation  PLAN OF CARE: Admit to inpatient   PATIENT DISPOSITION:  PACU - hemodynamically stable.   Delay start of Pharmacological VTE agent (>24hrs) due to surgical blood loss or risk of bleeding: yes

## 2016-07-01 NOTE — Progress Notes (Signed)
Discharged instructions/education/AVS/Rx given to patient with husband at bedside and they both verbalized understanding. Patient MAE well and ambulating on the hallway with walker well. Voiding with no difficulty and tolerated her dinner with no issues. No swelling, no drainage, no redness noted on incision site. D/C via wheelchair.

## 2016-07-02 ENCOUNTER — Encounter (HOSPITAL_COMMUNITY): Payer: Self-pay | Admitting: Neurosurgery

## 2016-07-05 NOTE — Anesthesia Postprocedure Evaluation (Addendum)
Anesthesia Post Note  Patient: Brittany Acosta  Procedure(s) Performed: Procedure(s) (LRB): Laminectomy for synovial cyst - left - Lumbar four-five (Left)  Patient location during evaluation: PACU Anesthesia Type: General Level of consciousness: awake and sedated Pain management: pain level controlled Vital Signs Assessment: post-procedure vital signs reviewed and stable Respiratory status: spontaneous breathing Cardiovascular status: stable Postop Assessment: no signs of nausea or vomiting Anesthetic complications: no        Last Vitals:  Vitals:   07/01/16 1556 07/01/16 2000  BP: (!) 119/45 123/62  Pulse: 89 85  Resp: 18 19  Temp: 36.7 C 37 C    Last Pain:  Vitals:   07/01/16 2000  TempSrc: Oral  PainSc:    Pain Goal: Patients Stated Pain Goal: 5 (07/01/16 1530)               [place]

## 2016-07-12 ENCOUNTER — Ambulatory Visit: Payer: Medicare HMO | Admitting: Family Medicine

## 2016-07-19 ENCOUNTER — Ambulatory Visit: Payer: Medicare HMO | Admitting: Family Medicine

## 2016-07-19 ENCOUNTER — Encounter: Payer: Medicare HMO | Admitting: Internal Medicine

## 2016-07-22 ENCOUNTER — Telehealth: Payer: Self-pay | Admitting: Internal Medicine

## 2016-07-22 NOTE — Telephone Encounter (Signed)
Pt called wanting a call back, she recently had back surgery. She was referred to physical therapy and she is not able to be driven to her appointments. She was referred before to a place you stay at and she wants to be referred there again.

## 2016-07-22 NOTE — Telephone Encounter (Signed)
Spoke with patient. Advised her to have her surgeon send over the referral to physical therapy location she speaks of. She thought we had referred her to this physical therapy location years ago. No referral was sent for physical therapy in her chart previously.

## 2016-07-28 ENCOUNTER — Telehealth: Payer: Self-pay | Admitting: Internal Medicine

## 2016-07-28 NOTE — Telephone Encounter (Signed)
Please advise as you have not filled this med before.

## 2016-07-28 NOTE — Telephone Encounter (Signed)
She should get from whoever was prescribing it previously

## 2016-07-29 DIAGNOSIS — M7138 Other bursal cyst, other site: Secondary | ICD-10-CM | POA: Diagnosis not present

## 2016-07-29 DIAGNOSIS — M5126 Other intervertebral disc displacement, lumbar region: Secondary | ICD-10-CM | POA: Diagnosis not present

## 2016-08-09 NOTE — Telephone Encounter (Signed)
Patient called back in.  Gave MD response.  Patient states she no longer is a patient of the provider who originally prescribed this medication.  I informed patient she would need to come in to meet with Dr. Quay Burow to discuss this further to see if Dr. Quay Burow could prescribe this medication.  Patient states she is not able to get out of the house right now due to surgery.  I informed patient to give our office a call back once she is able to get out and about to schedule an appointment.

## 2016-09-09 ENCOUNTER — Encounter: Payer: Self-pay | Admitting: Endocrinology

## 2016-09-09 ENCOUNTER — Ambulatory Visit (INDEPENDENT_AMBULATORY_CARE_PROVIDER_SITE_OTHER): Payer: Medicare HMO | Admitting: Endocrinology

## 2016-09-09 VITALS — BP 148/86 | HR 77 | Ht 62.0 in | Wt 258.0 lb

## 2016-09-09 DIAGNOSIS — E049 Nontoxic goiter, unspecified: Secondary | ICD-10-CM

## 2016-09-09 DIAGNOSIS — Z794 Long term (current) use of insulin: Secondary | ICD-10-CM

## 2016-09-09 DIAGNOSIS — E1165 Type 2 diabetes mellitus with hyperglycemia: Secondary | ICD-10-CM | POA: Diagnosis not present

## 2016-09-09 LAB — COMPREHENSIVE METABOLIC PANEL
ALT: 19 U/L (ref 0–35)
AST: 23 U/L (ref 0–37)
Albumin: 4.2 g/dL (ref 3.5–5.2)
Alkaline Phosphatase: 91 U/L (ref 39–117)
BUN: 20 mg/dL (ref 6–23)
CO2: 32 mEq/L (ref 19–32)
Calcium: 10 mg/dL (ref 8.4–10.5)
Chloride: 95 mEq/L — ABNORMAL LOW (ref 96–112)
Creatinine, Ser: 0.89 mg/dL (ref 0.40–1.20)
GFR: 67.43 mL/min (ref >60.00)
Glucose, Bld: 70 mg/dL (ref 70–99)
Potassium: 3.9 mEq/L (ref 3.5–5.1)
Sodium: 135 mEq/L (ref 135–145)
Total Bilirubin: 0.3 mg/dL (ref 0.2–1.2)
Total Protein: 7.3 g/dL (ref 6.0–8.3)

## 2016-09-09 LAB — TSH: TSH: 2.81 u[IU]/mL (ref 0.35–4.50)

## 2016-09-09 MED ORDER — CANAGLIFLOZIN 100 MG PO TABS: ORAL_TABLET | ORAL | 3 refills | Status: DC

## 2016-09-09 MED ORDER — METFORMIN HCL ER 500 MG PO TB24: 2000.0000 mg | ORAL_TABLET | Freq: Every day | ORAL | 3 refills | Status: DC

## 2016-09-09 NOTE — Progress Notes (Signed)
Patient ID: Brittany Acosta, female   DOB: 02/07/1951, 66 y.o.   MRN: [id number]            Reason for Appointment: Consultation for Type 2 Diabetes   History of Present Illness:          Date of diagnosis of type 2 diabetes mellitus: 2002       Background history:   He was started on metformin at the time of diagnosis, probably took this for 2 years.   She was started on insulin in 2004, not clear which brand she was taking but she was taking a pen injection At that time her metformin was stopped Apparently her history indicates that she did not have good compliance with taking multiple daily injections Has been probably on NPH insulin for several years now A1c has been mostly high especially before 2016 but subsequently has been mostly between 6.8 and 7.3  Recent history:   INSULIN regimen is: NPH insulin with a syringe  65 units before breakfast and--25 acs      Non-insulin hypoglycemic drugs the patient is taking are: none  Current management, blood sugar patterns and problems identified:  Patient has not been seen by her endocrinologist since late last year, she is now transferring her care  Although previously her A1c was 6.6 it was 7.3 in March  She did not bring her monitor for download and not clear what brand she is using  Review of her diary indicates that she only had some increase in fasting blood sugars earlier last month but otherwise blood sugars have been fairly good  She checks her blood sugars primarily in the morning and more recently at suppertime also  Over the last week or so the patient has unexpectedly started having low blood sugars.  The patient has been at the beach but she does not think she has been more active or changing her diet  She has had low blood sugars both morning and evening although is checking sugars mostly before breakfast and suppertime, blood sugars have been as low as 40 2 in the morning and 47 midday  She has not reduced her  insulin since she started having low sugars     She has had weight gain this year and she thinks she has gained 25 pounds        Side effects from medications have been: None     Glucose monitoring:  done  times a day         Glucometer: ?  Accu-Chek   .      Blood Glucose readings by review of diary, only recent readings:   PREMEAL Breakfast Lunch Dinner Bedtime  Overall   Glucose range: 42-113 47 58-147    Median:        Self-care: The diet that the patient has been following is: tries to limit fats     Typical meal intake: Breakfast is raisin bran.  Avoiding fast food               Dietician visit, most recent:2002               Exercise:none    Weight history:   Wt Readings from Last 3 Encounters:  09/09/16 258 lb (117 kg)  07/01/16 260 lb (117.9 kg)  06/28/16 260 lb 6.4 oz (118.1 kg)    Glycemic control:   Lab Results  Component Value Date   HGBA1C 7.3 (H) 06/28/2016   HGBA1C 6.6 02/16/2016  HGBA1C 6.8 11/17/2015   Lab Results  Component Value Date   MICROALBUR <0.7 06/26/2015   LDLCALC 89 06/26/2015   CREATININE 0.89 09/09/2016   Lab Results  Component Value Date   MICRALBCREAT 1.9 06/26/2015    Lab Results  Component Value Date   FRUCTOSAMINE 237 08/27/2010      Allergies as of 09/09/2016   No Known Allergies     Medication List       Accurate as of 09/09/16  7:22 PM. Always use your most recent med list.          accu-chek multiclix lancets Use to check blood sugar two time per day.   Biotin 10 MG Caps Take 1 capsule by mouth daily.   canagliflozin 100 MG Tabs tablet Commonly known as:  INVOKANA 1 tablet before breakfast   Coenzyme Q10 120 MG Caps Take 120 mg by mouth daily.   estradiol 0.5 MG tablet Commonly known as:  ESTRACE Take 1 tablet (0.5 mg total) by mouth daily.   furosemide 40 MG tablet Commonly known as:  LASIX Take 1 tablet (40 mg total) by mouth daily.   gabapentin 600 MG tablet Commonly known as:   NEURONTIN Take 600-900 mg by mouth 4 (four) times daily. 600mg  three times daily 900mg  at bedtime   hydrochlorothiazide 25 MG tablet Commonly known as:  HYDRODIURIL Take 1 tablet (25 mg total) by mouth every morning.   insulin NPH Human 100 UNIT/ML injection Commonly known as:  HUMULIN N,NOVOLIN N Inject 25-65 Units into the skin 2 (two) times daily before a meal.   INSULIN SYRINGE 1CC/31GX5/16" 31G X 5/16" 1 ML Misc Use to inject insulin two times per day.   lamoTRIgine 200 MG tablet Commonly known as:  LAMICTAL Take 1 tablet (200 mg total) by mouth daily at 12 noon.   levothyroxine 50 MCG tablet Commonly known as:  SYNTHROID, LEVOTHROID Take 1 tablet (50 mcg total) by mouth daily. --- Office visit needed for further refills   lisinopril 10 MG tablet Commonly known as:  PRINIVIL,ZESTRIL Take 1 tablet (10 mg total) by mouth daily.   meloxicam 15 MG tablet Commonly known as:  MOBIC TAKE 1 TABLET EVERY DAY   metFORMIN 500 MG 24 hr tablet Commonly known as:  GLUCOPHAGE-XR Take 4 tablets (2,000 mg total) by mouth daily with supper.   multivitamin with minerals Tabs tablet Take 1 tablet by mouth every evening.   omeprazole 20 MG capsule Commonly known as:  PRILOSEC Take 1 capsule (20 mg total) by mouth daily. Take 30 minutes before a meal   oxybutynin 10 MG 24 hr tablet Commonly known as:  DITROPAN-XL Take 1 tablet (10 mg total) by mouth every evening.   potassium citrate 10 MEQ (1080 MG) SR tablet Commonly known as:  UROCIT-K TAKE 2 TABLETS TWICE DAILY   prazosin 1 MG capsule Commonly known as:  MINIPRESS Take 3 capsules (3 mg total) by mouth at bedtime.   ranitidine 150 MG tablet Commonly known as:  ZANTAC Take 1 tablet (150 mg total) by mouth 2 (two) times daily.   sertraline 50 MG tablet Commonly known as:  ZOLOFT Take 1 tablet (50 mg total) by mouth daily.       Allergies: No Known Allergies  Past Medical History:  Diagnosis Date  . ALLERGIC  RHINITIS   . Arthritis   . BIPOLAR DISORDER UNSPECIFIED    depression  . Cancer ([place])    skin cancer on left leg  . Chronic  diastolic heart failure ([place])   . Depression   . FIBROMYALGIA   . GERD (gastroesophageal reflux disease)   . GOITER, MULTINODULAR   . Headache   . History of gout    "haven't had it in several years" (02/08/2012)  . History of kidney stones   . History of wrist fracture    rt wrist  . HYPERCHOLESTEROLEMIA   . Hypertension   . HYPOTHYROIDISM   . Iron deficiency anemia   . Kidney stones    sees urologist @ [place]  . Morbid obesity ([place])   . Neuropathy due to secondary diabetes ([place])   . PONV (postoperative nausea and vomiting)   . PTSD (post-traumatic stress disorder)   . RBBB   . Sleep apnea 7/14   mild osa-did not need cpap -dr Gwenette Greet  . Type II diabetes mellitus ([place])   . Wears glasses     Past Surgical History:  Procedure Laterality Date  . ABDOMINAL HYSTERECTOMY  1976  . BREAST SURGERY    . CARDIAC CATHETERIZATION  2001   sees Dr Peter Martinique  . CATARACT EXTRACTION  2014  . CATARACT EXTRACTION, BILATERAL  02/2011   epps  . CHOLECYSTECTOMY  1985  . EXCISIONAL HEMORRHOIDECTOMY     "dr cut out in his office" (02/08/2012)  . INCISIONAL BREAST BIOPSY  2000   right  . JOINT REPLACEMENT     rt knee  . KNEE ARTHROSCOPY  08/2009   right  . KNEE ARTHROSCOPY WITH MEDIAL MENISECTOMY     left  . Left Cystoscopy   1990  . LEFT OOPHORECTOMY  1980  . Lithotripsy (L) Kidney  1997  . LUMBAR LAMINECTOMY/DECOMPRESSION MICRODISCECTOMY Left 07/01/2016   Procedure: Laminectomy for synovial cyst - left - Lumbar four-five;  Surgeon: Earnie Larsson, MD;  Location: [facility];  Service: Neurosurgery;  Laterality: Left;  Marland Kitchen MASTECTOMY     left side  . PARTIAL MASTECTOMY WITH NEEDLE LOCALIZATION Left 09/01/2012   Procedure: PARTIAL MASTECTOMY WITH NEEDLE LOCALIZATION;  Surgeon: Adin Hector, MD;  Location: [facility];  Service: General;  Laterality: Left;  . Percitania stone  removed (L) Kidney  1992  . Right nasal surgery  08/1988  . Right sinus removed  08/1989   tooth partial  . ROTATOR CUFF REPAIR  2013   right shoulder x 2  . SHOULDER ARTHROSCOPY WITH ROTATOR CUFF REPAIR AND SUBACROMIAL DECOMPRESSION Left 08/20/2013   Procedure: SHOULDER ARTHROSCOPY  AND SUBACROMIAL DECOMPRESSION;  Surgeon: Nita Sells, MD;  Location: [facility];  Service: Orthopedics;  Laterality: Left;  Left shoulder arthroscopy, debridement, subacromial decompression, distal clavical resection  . THYROIDECTOMY  04/22/2011   Procedure: THYROIDECTOMY;  Surgeon: Onnie Graham, MD;  Location: [facility];  Service: ENT;  Laterality: N/A;  TOTAL THYROIDECOTMY  . TOTAL KNEE ARTHROPLASTY  06/18/2011   Procedure: TOTAL KNEE ARTHROPLASTY;  Surgeon: Kerin Salen, MD;  Location: [facility];  Service: Orthopedics;  Laterality: Left;  DEPUY  . TOTAL KNEE ARTHROPLASTY  02/07/2012   Procedure: TOTAL KNEE ARTHROPLASTY;  Surgeon: Kerin Salen, MD;  Location: [facility];  Service: Orthopedics;  Laterality: Right;  . TUBAL LIGATION  1972    Family History  Problem Relation Age of Onset  . Hypertension Mother   . Lung cancer Mother        non-smoker  . Diabetes Father   . Hypertension Father   . Hyperlipidemia Father   . Heart attack Other   . Coronary artery disease Other   .  Hypertension Sister   . Hypertension Brother   . Hypertension Brother   . Hypertension Brother   . Hypertension Sister   . Hypertension Sister   . Diabetes Daughter     Social History:  reports that she quit smoking about 33 years ago. Her smoking use included Cigarettes. She has a 6.00 pack-year smoking history. She has never used smokeless tobacco. She reports that she does not drink alcohol or use drugs.   Review of Systems  Constitutional: Negative for weight loss.  HENT: Negative for headaches.   Eyes: Negative for blurred vision.  Respiratory: Negative for shortness of breath.   Cardiovascular: Positive  for leg swelling. Negative for chest pain.       Still has leg swelling despite taking Lasix and HCTZ  Gastrointestinal: Negative for abdominal pain.  Endocrine: Positive for fatigue and cold intolerance.  Genitourinary: Negative for frequency.  Musculoskeletal: Positive for back pain.       Neck pain present recently, radiation to the left arm Her back pain and is not completely resolved despite surgery  Skin: Negative for itching.  Neurological: Positive for numbness and tingling.       Pains in feet for the last few years, treated with high-dose gabapentin and not getting enough relief She has had significant numbness in her feet and toes for the last few years   She takes Mobic for joint pains  Lipid history:     Lab Results  Component Value Date   CHOL 157 06/26/2015   HDL 55.40 06/26/2015   LDLCALC 89 06/26/2015   LDLDIRECT 56.4 06/11/2011   TRIG 64.0 06/26/2015   CHOLHDL 3 06/26/2015           Hypertension:Long-term treatment with lisinopril and HCTZ  Most recent eye exam was in 06/2015  Most recent foot exam: 6/18,  She had surgery on the right thyroid for ?  Nodular thyroid and also was told that her thyroid level is low, no recent labs  Lab Results  Component Value Date   TSH 2.81 09/09/2016     LABS:  Office Visit on 09/09/2016  Component Date Value Ref Range Status  . Sodium 09/09/2016 135  135 - 145 mEq/L Final  . Potassium 09/09/2016 3.9  3.5 - 5.1 mEq/L Final  . Chloride 09/09/2016 95* 96 - 112 mEq/L Final  . CO2 09/09/2016 32  19 - 32 mEq/L Final  . Glucose, Bld 09/09/2016 70  70 - 99 mg/dL Final  . BUN 09/09/2016 20  6 - 23 mg/dL Final  . Creatinine, Ser 09/09/2016 0.89  0.40 - 1.20 mg/dL Final  . Total Bilirubin 09/09/2016 0.3  0.2 - 1.2 mg/dL Final  . Alkaline Phosphatase 09/09/2016 91  39 - 117 U/L Final  . AST 09/09/2016 23  0 - 37 U/L Final  . ALT 09/09/2016 19  0 - 35 U/L Final  . Total Protein 09/09/2016 7.3  6.0 - 8.3 g/dL Final  .  Albumin 09/09/2016 4.2  3.5 - 5.2 g/dL Final  . Calcium 09/09/2016 10.0  8.4 - 10.5 mg/dL Final  . GFR 09/09/2016 67.43  >60.00 mL/min Final  . TSH 09/09/2016 2.81  0.35 - 4.50 uIU/mL Final    Physical Examination:  BP (!) 148/86   Pulse 77   Ht 5\' 2"  (1.575 m)   Wt 258 lb (117 kg)   SpO2 96%   BMI 47.19 kg/m   GENERAL:         Patient has generalized obesity.  Oral exam: No mucosal dryness   NEUROLOGICAL:   Ankle jerks are absent bilaterally.    Diabetic Foot Exam - Simple   Simple Foot Form Diabetic Foot exam was performed with the following findings:  Yes 09/09/2016  1:44 PM  Visual Inspection See comments:  Yes Sensation Testing See comments:  Yes Pulse Check Posterior Tibialis and Dorsalis pulse intact bilaterally:  Yes Comments Bilateral edema present Deviation of the left first toe laterally present with mild prominence of the joint Monofilament sensation absent in the feet            Vibration sense is  Absent in distal first toes. She has 2+ pedal edema       ASSESSMENT:  Diabetes type 2, uncontrolled with BMI 47 She has been treated long-term with insulin but only NPH insulin twice a day with fair control However more recently she is getting unexpected hypoglycemia with usual doses of insulin Also has had recent weight gain Currently taking no insulin sensitizers She does need weight loss with optimal insulin therapy, however not clear if she is truly insulin deficient and has not been on non-insulin hypoglycemic drugs since onset of diabetes  She has no diabetes education, although she is generally eating a relatively healthy diet    Her recent hypoglycemia may be related to worsening renal function, hypothyroidism or other metabolic dysfunction which need to be ruled out  Complications of diabetes: Severe peripheral neuropathy with pain and significant sensory loss as well as Charcot foot She will continue follow-up with her podiatrist  History of ?   Hypothyroidism and history of nodular goiter on thyroid supplementation, no recent labs available  PLAN:     Will need to reduce her insulin significantly to avoid further hypoglycemia She is a good candidate for medication like Invokana which would help with blood sugar control, weight loss, reduced insulin requirement and improvement of edema . Discussed action of SGLT 2 drugs on lowering glucose by decreasing kidney absorption of glucose, benefits of weight loss and lower blood pressure, possible side effects including candidiasis and dosage regimen   She will start with Invokana 100 mg daily  Along with this she will reduce her HCTZ to half tablet  She will also start metformin ER 500 mg twice a day and then increased up to 2000 mg a day if no diarrhea  She will only take 20 units of NPH in the morning from tomorrow and most likely may be able to cut down further  She will call if she has any further hypoglycemia or if the blood sugars are unexpectedly high  Consultation with dietitian and nurse educator recommended, will arrange on her next visit  Check BMP and TSH today  Will also need follow-up of fear and microalbumin and up-to-date eye exam for retinopathy  Follow-up in 3 weeks  Patient Instructions  Check blood sugars on waking up  daily  Also check blood sugars about 2-3 hours after a meal and do this after different meals by rotation  Recommended blood sugar levels on waking up is 90-130 and about 2 hours after meal is 130-160  Please bring your blood sugar monitor to each visit, thank you  INSULIN:  Stop insulin at Dinnertime Take only 20 units of insulin in the morning at breakfast  From tomorrow morning take Invokana just before breakfast Start taking metformin 1 tablet with breakfast and dinner Next week if no diarrhea start taking 2 tablets twice a day of the metformin  If blood sugars continue to get low with starting metformin and Invokana Stop insulin  and let us know       Total visit time for review of extensive patient records, detailed history taking, evaluation and management of difficult to treat diabetes, obesity, multiple other concomitant problems, counseling, review of medications, labs and counseling = 45 minutes  Brittany Acosta 09/09/2016, 7:22 PM   Note: This office note was prepared with Dragon voice recognition system technology. Any transcriptional errors that result from this process are unintentional.  Addendum: Labs normal

## 2016-09-09 NOTE — Patient Instructions (Addendum)
Check blood sugars on waking up  daily  Also check blood sugars about 2-3 hours after a meal and do this after different meals by rotation  Recommended blood sugar levels on waking up is 90-130 and about 2 hours after meal is 130-160  Please bring your blood sugar monitor to each visit, thank you  INSULIN:  Stop insulin at Dinnertime Take only 20 units of insulin in the morning at breakfast  From tomorrow morning take Invokana just before breakfast Start taking metformin 1 tablet with breakfast and dinner Next week if no diarrhea start taking 2 tablets twice a day of the metformin  If blood sugars continue to get low with starting metformin and Invokana Stop insulin and let us know

## 2016-09-10 ENCOUNTER — Telehealth: Payer: Self-pay | Admitting: Endocrinology

## 2016-09-10 ENCOUNTER — Telehealth: Payer: Self-pay

## 2016-09-10 NOTE — Addendum Note (Signed)
Addendum  created 09/10/16 1130 by Lyn Hollingshead, MD   Sign clinical note

## 2016-09-10 NOTE — Telephone Encounter (Signed)
Patient was prescribed canagliflozin (INVOKANA) 100 MG TABS tablet and was told by daughter not to take medication due to her daughter getting yeast infections.  Patient would like to be put on Glipiside or trulicity instead.   Please call patient to advise.

## 2016-09-10 NOTE — Telephone Encounter (Signed)
Patient states she is afraid to take invokana because her daughter was on it and kept a yeast infection the whole time and the pharmacy is out of it anyway is there anything else that she can take please advise

## 2016-09-13 NOTE — Telephone Encounter (Signed)
Spoke with the patient and explained the note below and she stated an understanding- patient will try Invokana and call if she has any problems with it

## 2016-09-13 NOTE — Telephone Encounter (Signed)
If she does not want Invokana she will have to take Victoza injections which will be probably expensive Yeast infection chances are only less than 8% I would like her to try Invokana at least for few days since it will help reduce her insulin significantly, reduce blood pressure, swelling, weight and other benefits instead of going up on insulin again

## 2016-09-13 NOTE — Telephone Encounter (Signed)
Please see note and advise  

## 2016-09-13 NOTE — Telephone Encounter (Signed)
Please disregard note for this patient earlier, I did not see this note until now. Thanks!

## 2016-09-24 ENCOUNTER — Ambulatory Visit: Payer: Medicare HMO | Admitting: Endocrinology

## 2016-09-27 ENCOUNTER — Telehealth: Payer: Self-pay | Admitting: Internal Medicine

## 2016-09-27 NOTE — Telephone Encounter (Signed)
Patient was supposed to have an appt on 09/24/16.  Patient needed to follow up because her insulin was reduced and her blood sugars are now running in the 200s consistently.  She got to [facility] on 09/24/16 and was told her appointment had been cancelled.  The appointment was cancelled in error.  The scheduler should have cancelled an appointment for 09/09/16 not 09/24/16.  When she came in for her appointment she was told she couldn't be seen and that she couldn't be fit in.  The patient was very upset because she was told she had cancelled it but she knew she had not.   Pt states she told the front office person that she was not feeling well because her blood sugar was high and she states the front office person told her that she had an appointment on 11/01/16 and she would need to wait until then.  Pt knows she cannot wait that long.  Please call patient about her elevated blood sugar asap. [phone]

## 2016-09-27 NOTE — Telephone Encounter (Signed)
Spoke with pt--Pt is out of town right now and will not be back this week, can this be discussed over the phone?

## 2016-09-27 NOTE — Telephone Encounter (Signed)
If she is willing to see me again I can try and work her in this week.  She is transferring to another doctor

## 2016-09-27 NOTE — Telephone Encounter (Signed)
Did not get a call back from [employer] per your prior conversation. Patient would like a call back. Thank you,  -LL

## 2016-09-27 NOTE — Telephone Encounter (Signed)
She needs to increase her insulin by 15 units twice a day

## 2016-09-28 NOTE — Telephone Encounter (Signed)
Called patient and let her know that she can take 1 of the 500mg  Metformin twice a day, total of 1000mg  daily to help stop the diarrhea.

## 2016-09-28 NOTE — Telephone Encounter (Signed)
She can reduce metformin down to 1000 mg

## 2016-09-28 NOTE — Telephone Encounter (Signed)
Called patient and let her know to increase her insulin another 15 units and she also stated that she increased her Metformin to 2000 mg daily and she is having severe diarrhea but she did not have this happen when she was on 1000 mg daily. Please advise if okay to go back to 1000 mg daily of the Metformin.

## 2016-11-01 ENCOUNTER — Ambulatory Visit (INDEPENDENT_AMBULATORY_CARE_PROVIDER_SITE_OTHER): Payer: Medicare HMO | Admitting: Internal Medicine

## 2016-11-01 ENCOUNTER — Other Ambulatory Visit: Payer: Self-pay

## 2016-11-01 ENCOUNTER — Encounter: Payer: Self-pay | Admitting: Internal Medicine

## 2016-11-01 VITALS — BP 122/72 | HR 87 | Wt 241.0 lb

## 2016-11-01 DIAGNOSIS — E1165 Type 2 diabetes mellitus with hyperglycemia: Secondary | ICD-10-CM | POA: Diagnosis not present

## 2016-11-01 DIAGNOSIS — E1159 Type 2 diabetes mellitus with other circulatory complications: Secondary | ICD-10-CM

## 2016-11-01 LAB — HM DIABETES EYE EXAM

## 2016-11-01 LAB — POCT GLYCOSYLATED HEMOGLOBIN (HGB A1C): Hemoglobin A1C: 6.8

## 2016-11-01 MED ORDER — CANAGLIFLOZIN 100 MG PO TABS: ORAL_TABLET | ORAL | 3 refills | Status: DC

## 2016-11-01 MED ORDER — METFORMIN HCL ER 500 MG PO TB24: 2000.0000 mg | ORAL_TABLET | Freq: Every day | ORAL | 2 refills | Status: DC

## 2016-11-01 MED ORDER — METFORMIN HCL ER 500 MG PO TB24: 500.0000 mg | ORAL_TABLET | Freq: Two times a day (BID) | ORAL | 3 refills | Status: DC

## 2016-11-01 NOTE — Addendum Note (Signed)
Addended by: Caprice Beaver T on: 11/01/2016 04:38 PM   Modules accepted: Orders

## 2016-11-01 NOTE — Patient Instructions (Addendum)
Please continue: - Metformin ER 500 mg 2x a day with meals - Invokana 100 mg before b'fast  Please change: - NPH to 28 units in am and 28 units at bedtime  Try to change from 2% milk to almond milk.  Please let me know if you start having low blood sugars again.  Please return in 3 months with your sugar log.   PATIENT INSTRUCTIONS FOR TYPE 2 DIABETES:  **Please join MyChart!** - see attached instructions about how to join if you have not done so already.  DIET AND EXERCISE Diet and exercise is an important part of diabetic treatment.  We recommended aerobic exercise in the form of brisk walking (working between 40-60% of maximal aerobic capacity, similar to brisk walking) for 150 minutes per week (such as 30 minutes five days per week) along with 3 times per week performing 'resistance' training (using various gauge rubber tubes with handles) 5-10 exercises involving the major muscle groups (upper body, lower body and core) performing 10-15 repetitions (or near fatigue) each exercise. Start at half the above goal but build slowly to reach the above goals. If limited by weight, joint pain, or disability, we recommend daily walking in a swimming pool with water up to waist to reduce pressure from joints while allow for adequate exercise.    BLOOD GLUCOSES Monitoring your blood glucoses is important for continued management of your diabetes. Please check your blood glucoses 2-4 times a day: fasting, before meals and at bedtime (you can rotate these measurements - e.g. one day check before the 3 meals, the next day check before 2 of the meals and before bedtime, etc.).   HYPOGLYCEMIA (low blood sugar) Hypoglycemia is usually a reaction to not eating, exercising, or taking too much insulin/ other diabetes drugs.  Symptoms include tremors, sweating, hunger, confusion, headache, etc. Treat IMMEDIATELY with 15 grams of Carbs: . 4 glucose tablets .  cup regular juice/soda . 2 tablespoons  raisins . 4 teaspoons sugar . 1 tablespoon honey Recheck blood glucose in 15 mins and repeat above if still symptomatic/blood glucose <100.  RECOMMENDATIONS TO REDUCE YOUR RISK OF DIABETIC COMPLICATIONS: * Take your prescribed MEDICATION(S) * Follow a DIABETIC diet: Complex carbs, fiber rich foods, (monounsaturated and polyunsaturated) fats * AVOID saturated/trans fats, high fat foods, >2,300 mg salt per day. * EXERCISE at least 5 times a week for 30 minutes or preferably daily.  * DO NOT SMOKE OR DRINK more than 1 drink a day. * Check your FEET every day. Do not wear tightfitting shoes. Contact us if you develop an ulcer * See your EYE doctor once a year or more if needed * Get a FLU shot once a year * Get a PNEUMONIA vaccine once before and once after age 14 years  GOALS:  * Your Hemoglobin A1c of <7%  * fasting sugars need to be <130 * after meals sugars need to be <180 (2h after you start eating) * Your Systolic BP should be 628 or lower  * Your Diastolic BP should be 80 or lower  * Your HDL (Good Cholesterol) should be 40 or higher  * Your LDL (Bad Cholesterol) should be 100 or lower. * Your Triglycerides should be 150 or lower  * Your Urine microalbumin (kidney function) should be <30 * Your Body Mass Index should be 25 or lower    Please consider the following ways to cut down carbs and fat and increase fiber and micronutrients in your diet: - substitute whole  grain for white bread or pasta - substitute brown rice for white rice - substitute 90-calorie flat bread pieces for slices of bread when possible - substitute sweet potatoes or yams for white potatoes - substitute humus for margarine - substitute tofu for cheese when possible - substitute almond or rice milk for regular milk (would not drink soy milk daily due to concern for soy estrogen influence on breast cancer risk) - substitute dark chocolate for other sweets when possible - substitute water - can add lemon or  orange slices for taste - for diet sodas (artificial sweeteners will trick your body that you can eat sweets without getting calories and will lead you to overeating and weight gain in the long run) - do not skip breakfast or other meals (this will slow down the metabolism and will result in more weight gain over time)  - can try smoothies made from fruit and almond/rice milk in am instead of regular breakfast - can also try old-fashioned (not instant) oatmeal made with almond/rice milk in am - order the dressing on the side when eating salad at a restaurant (pour less than half of the dressing on the salad) - eat as little meat as possible - can try juicing, but should not forget that juicing will get rid of the fiber, so would alternate with eating raw veg./fruits or drinking smoothies - use as little oil as possible, even when using olive oil - can dress a salad with a mix of balsamic vinegar and lemon juice, for e.g. - use agave nectar, stevia sugar, or regular sugar rather than artificial sweateners - steam or broil/roast veggies  - snack on veggies/fruit/nuts (unsalted, preferably) when possible, rather than processed foods - reduce or eliminate aspartame in diet (it is in diet sodas, chewing gum, etc) Read the labels!  Try to read Dr. Janene Harvey book: "Program for Reversing Diabetes" for other ideas for healthy eating.

## 2016-11-01 NOTE — Progress Notes (Signed)
Patient ID: [name], female   DOB: 01-12-1951, 66 y.o.   MRN: [id number]   HPI: Brittany Acosta is a 66 y.o.-year-old female, referred by her PCP, Dr. Franne Forts, for management of DM2, dx in 2002, insulin-dependent since ~2004, uncontrolled, with complications (dCHF, PN). She was initially seen by Dr. Loanne Drilling, then Dr. Dwyane Dee, with last visit 09/2016, and now she would like to start seeing me.   Last hemoglobin A1c was: Lab Results  Component Value Date   HGBA1C 7.3 (H) 06/28/2016   HGBA1C 6.6 02/16/2016   HGBA1C 6.8 11/17/2015   Pt is on a regimen of: - Metformin ER 1000 mg 2x a day, with meals >> diarrhea >> 500 mg 2x a day - Invokana 100 mg in a.m. - NPH 35 in am and 25 units at night  Pt checks her sugars 2x a day and they are: - am: 116-163, 210 in 09/2016 - 2h after b'fast: 186 - before lunch: 56 - 2h after lunch: n/c - before dinner: 52, 66, 87-142, 255 - 2h after dinner: 88-145, 243 - bedtime: n/c - nighttime: n/c No lows. Lowest sugar was 40s, not in last month; she has hypoglycemia awareness at 70.  Highest sugar was 200s  Glucometer: Accu-Cheks   Meals are: - Breakfast: cereal - cheerios, 2% milk - Lunch: sandwich - Dinner: salads, meat + starch - Snacks: fruit, fig newtons  She lost 20 lbs in last 4 mo!  - no CKD, last BUN/creatinine:  Lab Results  Component Value Date   BUN 20 09/09/2016   BUN 20 06/28/2016   CREATININE 0.89 09/09/2016   CREATININE 1.02 (H) 07/01/2016  She is on lisinopril.  - last set of lipids: Lab Results  Component Value Date   CHOL 157 06/26/2015   HDL 55.40 06/26/2015   LDLCALC 89 06/26/2015   LDLDIRECT 56.4 06/11/2011   TRIG 64.0 06/26/2015   CHOLHDL 3 06/26/2015   - last eye exam was in 11/01/2016. No DR.  - she has numbness and tingling in her feet. Last Foot exam was in 09/2016. On Neurontin.  Last TSH normal: Lab Results  Component Value Date   TSH 2.81 09/09/2016   ROS: Constitutional: + weight loss, no fatigue,  no subjective hyperthermia/hypothermia Eyes: no blurry vision, no xerophthalmia ENT: no sore throat, no nodules palpated in throat, no dysphagia/odynophagia, no hoarseness Cardiovascular: no CP/SOB/palpitations/+ leg swelling Respiratory: no cough/SOB Gastrointestinal: + N/no V/+ D/no C Musculoskeletal: + muscle/+ joint aches Skin: no rashes Neurological: no tremors/numbness/tingling/dizziness  I reviewed pt's medications, allergies, PMH, social hx, family hx:  Past Medical History:  Diagnosis Date  . ALLERGIC RHINITIS   . Arthritis   . BIPOLAR DISORDER UNSPECIFIED    depression  . Cancer ([place])    skin cancer on left leg  . Chronic diastolic heart failure ([place])   . Depression   . FIBROMYALGIA   . GERD (gastroesophageal reflux disease)   . GOITER, MULTINODULAR   . Headache   . History of gout    "haven't had it in several years" (02/08/2012)  . History of kidney stones   . History of wrist fracture    rt wrist  . HYPERCHOLESTEROLEMIA   . Hypertension   . HYPOTHYROIDISM   . Iron deficiency anemia   . Kidney stones    sees urologist @ [facility]  . Morbid obesity ([place])   . Neuropathy due to secondary diabetes ([place])   . PONV (postoperative nausea and vomiting)   . PTSD (post-traumatic  stress disorder)   . RBBB   . Sleep apnea 7/14   mild osa-did not need cpap -dr Gwenette Greet  . Type II diabetes mellitus ([place])   . Wears glasses    Past Surgical History:  Procedure Laterality Date  . ABDOMINAL HYSTERECTOMY  1976  . BREAST SURGERY    . CARDIAC CATHETERIZATION  2001   sees Dr Peter Martinique  . CATARACT EXTRACTION  2014  . CATARACT EXTRACTION, BILATERAL  02/2011   epps  . CHOLECYSTECTOMY  1985  . EXCISIONAL HEMORRHOIDECTOMY     "dr cut out in his office" (02/08/2012)  . INCISIONAL BREAST BIOPSY  2000   right  . JOINT REPLACEMENT     rt knee  . KNEE ARTHROSCOPY  08/2009   right  . KNEE ARTHROSCOPY WITH MEDIAL MENISECTOMY     left  . Left Cystoscopy   1990  . LEFT  OOPHORECTOMY  1980  . Lithotripsy (L) Kidney  1997  . LUMBAR LAMINECTOMY/DECOMPRESSION MICRODISCECTOMY Left 07/01/2016   Procedure: Laminectomy for synovial cyst - left - Lumbar four-five;  Surgeon: Earnie Larsson, MD;  Location: [facility];  Service: Neurosurgery;  Laterality: Left;  Marland Kitchen MASTECTOMY     left side  . PARTIAL MASTECTOMY WITH NEEDLE LOCALIZATION Left 09/01/2012   Procedure: PARTIAL MASTECTOMY WITH NEEDLE LOCALIZATION;  Surgeon: Adin Hector, MD;  Location: [facility];  Service: General;  Laterality: Left;  . Percitania stone removed (L) Kidney  1992  . Right nasal surgery  08/1988  . Right sinus removed  08/1989   tooth partial  . ROTATOR CUFF REPAIR  2013   right shoulder x 2  . SHOULDER ARTHROSCOPY WITH ROTATOR CUFF REPAIR AND SUBACROMIAL DECOMPRESSION Left 08/20/2013   Procedure: SHOULDER ARTHROSCOPY  AND SUBACROMIAL DECOMPRESSION;  Surgeon: Nita Sells, MD;  Location: [facility];  Service: Orthopedics;  Laterality: Left;  Left shoulder arthroscopy, debridement, subacromial decompression, distal clavical resection  . THYROIDECTOMY  04/22/2011   Procedure: THYROIDECTOMY;  Surgeon: Onnie Graham, MD;  Location: [facility];  Service: ENT;  Laterality: N/A;  TOTAL THYROIDECOTMY  . TOTAL KNEE ARTHROPLASTY  06/18/2011   Procedure: TOTAL KNEE ARTHROPLASTY;  Surgeon: Kerin Salen, MD;  Location: [facility];  Service: Orthopedics;  Laterality: Left;  DEPUY  . TOTAL KNEE ARTHROPLASTY  02/07/2012   Procedure: TOTAL KNEE ARTHROPLASTY;  Surgeon: Kerin Salen, MD;  Location: [facility];  Service: Orthopedics;  Laterality: Right;  . TUBAL LIGATION  1972   Social History   Social History  . Marital status: Married    Spouse name: N/A  . Number of children: 2  . Years of education: 14   Occupational History  . disability Retired   Social History Main Topics  . Smoking status: Former Smoker    Packs/day: 1.00    Years: 6.00    Types: Cigarettes    Quit date: 04/06/1983  . Smokeless  tobacco: Never Used     Comment: Married, lives with spouse. Disable- 2 grown kids-6 g-kids  . Alcohol use No     Comment: none  . Drug use: No  . Sexual activity: Not Currently   Other Topics Concern  . Not on file   Social History Narrative   Married, lives with spouse - 2 adult children   Disabled      No regular exercise   Current Outpatient Prescriptions on File Prior to Visit  Medication Sig Dispense Refill  . Biotin 10 MG CAPS Take 1 capsule  by mouth daily.    . canagliflozin (INVOKANA) 100 MG TABS tablet 1 tablet before breakfast 30 tablet 3  . Coenzyme Q10 120 MG CAPS Take 120 mg by mouth daily.    Marland Kitchen estradiol (ESTRACE) 0.5 MG tablet Take 1 tablet (0.5 mg total) by mouth daily. 90 tablet 3  . furosemide (LASIX) 40 MG tablet Take 1 tablet (40 mg total) by mouth daily. 90 tablet 3  . gabapentin (NEURONTIN) 600 MG tablet Take 600-900 mg by mouth 4 (four) times daily. 600mg  three times daily 900mg  at bedtime    . hydrochlorothiazide (HYDRODIURIL) 25 MG tablet Take 1 tablet (25 mg total) by mouth every morning. (Patient taking differently: Take 25 mg by mouth daily. ) 90 tablet 3  . insulin NPH Human (HUMULIN N,NOVOLIN N) 100 UNIT/ML injection Inject 25-65 Units into the skin 2 (two) times daily before a meal.    . Insulin Syringe-Needle U-100 (INSULIN SYRINGE 1CC/31GX5/16") 31G X 5/16" 1 ML MISC Use to inject insulin two times per day. 200 each 2  . Lancets (ACCU-CHEK MULTICLIX) lancets Use to check blood sugar two time per day. 200 each 2  . lisinopril (PRINIVIL,ZESTRIL) 10 MG tablet Take 1 tablet (10 mg total) by mouth daily. 90 tablet 3  . Multiple Vitamin (MULTIVITAMIN WITH MINERALS) TABS Take 1 tablet by mouth every evening.    Marland Kitchen omeprazole (PRILOSEC) 20 MG capsule Take 1 capsule (20 mg total) by mouth daily. Take 30 minutes before a meal (Patient taking differently: Take 20 mg by mouth daily before lunch. Take 30 minutes before a meal) 90 capsule 3  . oxybutynin  (DITROPAN-XL) 10 MG 24 hr tablet Take 1 tablet (10 mg total) by mouth every evening. 90 tablet 3  . prazosin (MINIPRESS) 1 MG capsule Take 3 capsules (3 mg total) by mouth at bedtime. 270 capsule 3  . ranitidine (ZANTAC) 150 MG tablet Take 1 tablet (150 mg total) by mouth 2 (two) times daily. 180 tablet 3  . sertraline (ZOLOFT) 50 MG tablet Take 1 tablet (50 mg total) by mouth daily. 90 tablet 3  . lamoTRIgine (LAMICTAL) 200 MG tablet Take 1 tablet (200 mg total) by mouth daily at 12 noon. (Patient not taking: Reported on 11/01/2016) 90 tablet 3  . levothyroxine (SYNTHROID, LEVOTHROID) 50 MCG tablet Take 1 tablet (50 mcg total) by mouth daily. --- Office visit needed for further refills (Patient not taking: Reported on 11/01/2016) 90 tablet 0  . meloxicam (MOBIC) 15 MG tablet TAKE 1 TABLET EVERY DAY (Patient not taking: Reported on 11/01/2016) 90 tablet 0  . potassium citrate (UROCIT-K) 10 MEQ (1080 MG) SR tablet TAKE 2 TABLETS TWICE DAILY (Patient not taking: Reported on 11/01/2016) 360 tablet 0   No current facility-administered medications on file prior to visit.    No Known Allergies Family History  Problem Relation Age of Onset  . Hypertension Mother   . Lung cancer Mother        non-smoker  . Diabetes Father   . Hypertension Father   . Hyperlipidemia Father   . Heart attack Other   . Coronary artery disease Other   . Hypertension Sister   . Hypertension Brother   . Hypertension Brother   . Hypertension Brother   . Hypertension Sister   . Hypertension Sister   . Diabetes Daughter    PE: BP 122/72 (BP Location: Left Arm, Patient Position: Sitting)   Pulse 87   Wt 241 lb (109.3 kg)   SpO2 97%  BMI 44.08 kg/m  Wt Readings from Last 3 Encounters:  11/01/16 241 lb (109.3 kg)  09/09/16 258 lb (117 kg)  07/01/16 260 lb (117.9 kg)   Constitutional: overweight, in NAD Eyes: PERRLA, EOMI, no exophthalmos ENT: moist mucous membranes, no thyromegaly, no cervical  lymphadenopathy Cardiovascular: RRR, No MRG, + B periankle swelling Respiratory: CTA B Gastrointestinal: abdomen soft, NT, ND, BS+ Musculoskeletal: no deformities, strength intact in all 4 Skin: moist, warm, no rashes Neurological: no tremor with outstretched hands, DTR normal in all 4  ASSESSMENT: 1. DM2, insulin-dependent, uncontrolled, with complications - dCHF - PN  PLAN:  1. Patient with long-standing, uncontrolled diabetes, on oral antidiabetic regimen + intermediate acting insulin (NPH), Which is insufficient. Her sugars are higher in the morning and a decrease in the middle of the day.  She had sugars as low as 40s but these improved in the last 1.5 months after her insulin dose was decreased. The pattern is still maintained though, so at this visit, I discussed with her about decreasing the morning dose of NPH and increasing the evening dose and also moving this dose at bedtime rather than before dinner to avoid hypoglycemia at night    - we also discussed about improving diet and gave her specific examples  - HbA1c today is slightly better, at 6.8%. - I suggested to:  Patient Instructions  Please continue: - Metformin ER 500 mg 2x a day with meals - Invokana 100 mg before b'fast  Please change: - NPH to 28 units in am and 28 units at bedtime  Try to change from 2% milk to almond milk.  Please let me know if you start having low blood sugars again.  Please return in 3 months with your sugar log.   - continue checking sugars at different times of the day - check 2 times a day, rotating checks - she does a good job with this and brings a good sugar log. We discussed about sugar targets for pre-and postprandial CBGs.  - given foot care handout and explained the principles  - given instructions for hypoglycemia management "15-15 rule"  - advised for yearly eye exams >>  she had one today - Return to clinic in 3 mo with sugar log. I advised her to let me know if she has any  more hypoglycemia until I see her back.   - time spent with the patient: 40 min, of which >50% was spent in obtaining information about her  Diabetes, reviewing her meals, previous labs, previous evaluations, and treatments, counseling her about her condition (please see the discussed topics above) and also developing a treatment plan to avoid further hypo- and hyperglycemia.   Philemon Kingdom, MD PhD [facility]

## 2016-11-09 ENCOUNTER — Telehealth: Payer: Self-pay | Admitting: Internal Medicine

## 2016-11-09 NOTE — Telephone Encounter (Signed)
Called patient and advised of the shoe form. Advised that I had contacted the [place] office to help fill it out, and left a message to be called back. No call from them yet but would contact the patient once the form was completed and faxed for the shoes.

## 2016-11-09 NOTE — Telephone Encounter (Signed)
Patient called in reference to form she dropped off to fill out for diabetic shoes. The store says they have not received the fax yet. Please call patient with any questions. OK to leave message.  Brittany Acosta [employer] [phone]

## 2016-11-10 NOTE — Telephone Encounter (Signed)
Podiatrist office called in reference to patient going to [place] Thursday or Friday this week and will be filling out medical release form for shoes.

## 2016-11-11 ENCOUNTER — Other Ambulatory Visit: Payer: Self-pay | Admitting: Internal Medicine

## 2016-11-19 ENCOUNTER — Other Ambulatory Visit: Payer: Self-pay | Admitting: Endocrinology

## 2016-11-19 DIAGNOSIS — G4733 Obstructive sleep apnea (adult) (pediatric): Secondary | ICD-10-CM | POA: Insufficient documentation

## 2016-11-19 DIAGNOSIS — M545 Low back pain, unspecified: Secondary | ICD-10-CM | POA: Insufficient documentation

## 2016-11-19 DIAGNOSIS — E890 Postprocedural hypothyroidism: Secondary | ICD-10-CM | POA: Insufficient documentation

## 2016-11-19 DIAGNOSIS — E1140 Type 2 diabetes mellitus with diabetic neuropathy, unspecified: Secondary | ICD-10-CM | POA: Insufficient documentation

## 2016-11-19 DIAGNOSIS — M1990 Unspecified osteoarthritis, unspecified site: Secondary | ICD-10-CM | POA: Insufficient documentation

## 2016-11-19 DIAGNOSIS — M542 Cervicalgia: Secondary | ICD-10-CM | POA: Insufficient documentation

## 2016-11-19 DIAGNOSIS — F331 Major depressive disorder, recurrent, moderate: Secondary | ICD-10-CM | POA: Insufficient documentation

## 2016-11-19 DIAGNOSIS — F4310 Post-traumatic stress disorder, unspecified: Secondary | ICD-10-CM | POA: Insufficient documentation

## 2016-11-22 ENCOUNTER — Telehealth: Payer: Self-pay

## 2016-11-22 NOTE — Telephone Encounter (Signed)
Called to notify patient that the forms for her shoes was submitted today. Patient had no questions.

## 2016-11-22 NOTE — Telephone Encounter (Signed)
Called and notified patient that forms had been submitted. No questions.

## 2016-11-22 NOTE — Telephone Encounter (Signed)
Patient called to check the status of the note below about the shoe form, patient has not been updated and it has been since 11/10/16. Call patient at [phone] to advise.

## 2016-11-26 ENCOUNTER — Other Ambulatory Visit: Payer: Self-pay | Admitting: Neurosurgery

## 2016-11-26 DIAGNOSIS — M4802 Spinal stenosis, cervical region: Secondary | ICD-10-CM

## 2016-11-29 ENCOUNTER — Ambulatory Visit
Admission: RE | Admit: 2016-11-29 | Discharge: 2016-11-29 | Disposition: A | Discharge status: Home / Self Care | Payer: Medicare HMO | Source: Ambulatory Visit | Attending: Neurosurgery | Admitting: Neurosurgery

## 2016-11-29 DIAGNOSIS — M4802 Spinal stenosis, cervical region: Secondary | ICD-10-CM

## 2016-12-23 DIAGNOSIS — R110 Nausea: Secondary | ICD-10-CM | POA: Insufficient documentation

## 2017-01-03 ENCOUNTER — Other Ambulatory Visit: Payer: Self-pay | Admitting: Internal Medicine

## 2017-01-03 DIAGNOSIS — Z1231 Encounter for screening mammogram for malignant neoplasm of breast: Secondary | ICD-10-CM

## 2017-01-14 ENCOUNTER — Other Ambulatory Visit: Payer: Self-pay | Admitting: Internal Medicine

## 2017-01-14 DIAGNOSIS — R234 Changes in skin texture: Secondary | ICD-10-CM

## 2017-01-18 ENCOUNTER — Other Ambulatory Visit: Payer: Medicare HMO

## 2017-01-18 DIAGNOSIS — M5412 Radiculopathy, cervical region: Secondary | ICD-10-CM | POA: Insufficient documentation

## 2017-01-20 ENCOUNTER — Ambulatory Visit (INDEPENDENT_AMBULATORY_CARE_PROVIDER_SITE_OTHER): Payer: Medicare HMO | Admitting: Internal Medicine

## 2017-01-20 ENCOUNTER — Encounter: Payer: Self-pay | Admitting: Internal Medicine

## 2017-01-20 VITALS — BP 152/68 | HR 72 | Wt 246.0 lb

## 2017-01-20 DIAGNOSIS — E039 Hypothyroidism, unspecified: Secondary | ICD-10-CM | POA: Diagnosis not present

## 2017-01-20 DIAGNOSIS — E1165 Type 2 diabetes mellitus with hyperglycemia: Secondary | ICD-10-CM

## 2017-01-20 DIAGNOSIS — E1159 Type 2 diabetes mellitus with other circulatory complications: Secondary | ICD-10-CM

## 2017-01-20 MED ORDER — INSULIN NPH (HUMAN) (ISOPHANE) 100 UNIT/ML ~~LOC~~ SUSP: 18.0000 [IU] | Freq: Two times a day (BID) | SUBCUTANEOUS | 11 refills | Status: DC

## 2017-01-20 NOTE — Progress Notes (Signed)
Patient ID: Brittany Acosta, female   DOB: 1950-11-21, 66 y.o.   MRN: [id number]   HPI: Brittany Acosta is a 66 y.o.-year-old female, referred by her PCP, Dr. Franne Forts, for management of DM2, dx in 2002, insulin-dependent since ~2004, uncontrolled, with complications (dCHF, PN). She was initially seen by Dr. Loanne Drilling, then Dr. Dwyane Dee, with last visit 09/2016, last visit with me 2.5 mo ago.  Last hemoglobin A1c was: 11/19/2016: HbA1c 6.9% Lab Results  Component Value Date   HGBA1C 6.8 11/01/2016   HGBA1C 7.3 (H) 06/28/2016   HGBA1C 6.6 02/16/2016   Pt is on a regimen of: - Metformin ER 1000 mg 2x a day, with meals >> diarrhea >> 500 mg 2x a day - Invokana 100 mg in a.m. - NPH 35 in am and 25 units at night >> 28 units 2x a day - changed 10/2016  Pt checks her sugars 2x a day >> MANY lows: - am: 116-163, 210 in 09/2016 >> 56-128 - 2h after b'fast: 186 >> n/c - before lunch: 56 >> 44x1 - 2h after lunch: n/c - before dinner: 52, 66, 87-142, 255 >> 48-90, 135, 159 - 2h after dinner: 88-145, 243 >> n/c - bedtime: n/c >> 65-113 - nighttime: n/c Lowest sugar was 40s >> now: 44; she has hypoglycemia awareness at 70.  Highest sugar was 200s >> 159.  Glucometer: Accu-Cheks   Meals are: - Breakfast: cereal - cheerios, 2% milk - Lunch: sandwich - Dinner: salads, meat + starch - Snacks: fruit, fig newtons  She lost 20 lbs in the 4 mo before last visit.  - No CKD, last BUN/creatinine:  11/19/2016: BUN/Cr 11/0.78 Lab Results  Component Value Date   BUN 20 09/09/2016   BUN 20 06/28/2016   CREATININE 0.89 09/09/2016   CREATININE 1.02 (H) 07/01/2016  On Lisinopril - last set of lipids: Lab Results  Component Value Date   CHOL 157 06/26/2015   HDL 55.40 06/26/2015   LDLCALC 89 06/26/2015   LDLDIRECT 56.4 06/11/2011   TRIG 64.0 06/26/2015   CHOLHDL 3 06/26/2015   - last eye exam was in 10/2016 >> No DR.  - she has numbness and tingling in her feet. Last Foot exam was in 09/2016. On  Neurontin.  She also has hypothyroidism and is on levothyroxine 50 g daily. She takes this with multivitamins and close to po iron. Latest TSH was 4.735 on 11/19/2016.  ROS: Constitutional: + weight loss, + fatigue, no subjective hyperthermia, no subjective hypothermia Eyes: no blurry vision, no xerophthalmia ENT: no sore throat, no nodules palpated in throat, no dysphagia, no odynophagia, no hoarseness Cardiovascular: no CP/no SOB/no palpitations/+ leg swelling Respiratory: no cough/no SOB/no wheezing Gastrointestinal: + N/+ V/+ D/no C/no acid reflux Musculoskeletal: + muscle aches/+ joint aches Skin: no rashes, no hair loss Neurological: no tremors/+ numbness/+ tingling/no dizziness  I reviewed pt's medications, allergies, PMH, social hx, family hx, and changes were documented in the history of present illness. Otherwise, unchanged from my initial visit note.  Past Medical History:  Diagnosis Date  . ALLERGIC RHINITIS   . Arthritis   . BIPOLAR DISORDER UNSPECIFIED    depression  . Cancer ([place])    skin cancer on left leg  . Chronic diastolic heart failure ([place])   . Depression   . FIBROMYALGIA   . GERD (gastroesophageal reflux disease)   . GOITER, MULTINODULAR   . Headache   . History of gout    "haven't had it in several years" (02/08/2012)  .  History of kidney stones   . History of wrist fracture    rt wrist  . HYPERCHOLESTEROLEMIA   . Hypertension   . HYPOTHYROIDISM   . Iron deficiency anemia   . Kidney stones    sees urologist @ [place]  . Morbid obesity ([place])   . 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 PONV (postoperative nausea and vomiting)   . PTSD (post-traumatic stress disorder)   . RBBB   . Sleep apnea 7/14   mild osa-did not need cpap -dr Gwenette Greet  . Type II diabetes mellitus ([place])   . Wears glasses    Past Surgical History:  Procedure Laterality Date  . ABDOMINAL HYSTERECTOMY  1976  . BREAST SURGERY    . CARDIAC CATHETERIZATION  2001   sees Dr  Peter Martinique  . CATARACT EXTRACTION  2014  . CATARACT EXTRACTION, BILATERAL  02/2011   epps  . CHOLECYSTECTOMY  1985  . EXCISIONAL HEMORRHOIDECTOMY     "dr cut out in his office" (02/08/2012)  . INCISIONAL BREAST BIOPSY  2000   right  . JOINT REPLACEMENT     rt knee  . KNEE ARTHROSCOPY  08/2009   right  . KNEE ARTHROSCOPY WITH MEDIAL MENISECTOMY     left  . Left Cystoscopy   1990  . LEFT OOPHORECTOMY  1980  . Lithotripsy (L) Kidney  1997  . LUMBAR LAMINECTOMY/DECOMPRESSION MICRODISCECTOMY Left 07/01/2016   Procedure: Laminectomy for synovial cyst - left - Lumbar four-five;  Surgeon: Earnie Larsson, MD;  Location: [facility];  Service: Neurosurgery;  Laterality: Left;  Marland Kitchen MASTECTOMY     left side  . PARTIAL MASTECTOMY WITH NEEDLE LOCALIZATION Left 09/01/2012   Procedure: PARTIAL MASTECTOMY WITH NEEDLE LOCALIZATION;  Surgeon: Adin Hector, MD;  Location: [facility];  Service: General;  Laterality: Left;  . Percitania stone removed (L) Kidney  1992  . Right nasal surgery  08/1988  . Right sinus removed  08/1989   tooth partial  . ROTATOR CUFF REPAIR  2013   right shoulder x 2  . SHOULDER ARTHROSCOPY WITH ROTATOR CUFF REPAIR AND SUBACROMIAL DECOMPRESSION Left 08/20/2013   Procedure: SHOULDER ARTHROSCOPY  AND SUBACROMIAL DECOMPRESSION;  Surgeon: Nita Sells, MD;  Location: [place];  Service: Orthopedics;  Laterality: Left;  Left shoulder arthroscopy, debridement, subacromial decompression, distal clavical resection  . THYROIDECTOMY  04/22/2011   Procedure: THYROIDECTOMY;  Surgeon: Onnie Graham, MD;  Location: [facility];  Service: ENT;  Laterality: N/A;  TOTAL THYROIDECOTMY  . TOTAL KNEE ARTHROPLASTY  06/18/2011   Procedure: TOTAL KNEE ARTHROPLASTY;  Surgeon: Kerin Salen, MD;  Location: [facility] [place];  Service: Orthopedics;  Laterality: Left;  DEPUY  . TOTAL KNEE ARTHROPLASTY  02/07/2012   Procedure: TOTAL KNEE ARTHROPLASTY;  Surgeon: Kerin Salen, MD;  Location: [facility];  Service:  Orthopedics;  Laterality: Right;  . TUBAL LIGATION  1972   Social History   Social History  . Marital status: Married    Spouse name: N/A  . Number of children: 2  . Years of education: 14   Occupational History  . disability Retired   Social History Main Topics  . Smoking status: Former Smoker    Packs/day: 1.00    Years: 6.00    Types: Cigarettes    Quit date: 04/06/1983  . Smokeless tobacco: Never Used     Comment: Married, lives with spouse. Disable- 2 grown kids-6 g-kids  . Alcohol use No     Comment:  none  . Drug use: No  . Sexual activity: Not Currently   Other Topics Concern  . Not on file   Social History Narrative   Married, lives with spouse - 2 adult children   Disabled      No regular exercise   Current Outpatient Prescriptions on File Prior to Visit  Medication Sig Dispense Refill  . Ascorbic Acid (VITAMIN C) 500 MG CAPS Take by mouth.    . Biotin 10 MG CAPS Take 1 capsule by mouth daily.    . canagliflozin (INVOKANA) 100 MG TABS tablet 1 tablet before breakfast 90 tablet 3  . Coenzyme Q10 120 MG CAPS Take 120 mg by mouth daily.    . Ergocalciferol (VITAMIN D2) 2000 units TABS Take by mouth.    . estradiol (ESTRACE) 0.5 MG tablet Take 1 tablet (0.5 mg total) by mouth daily. 90 tablet 3  . furosemide (LASIX) 40 MG tablet Take 1 tablet (40 mg total) by mouth daily. 90 tablet 3  . gabapentin (NEURONTIN) 600 MG tablet Take 600-900 mg by mouth 4 (four) times daily. 600mg  three times daily 900mg  at bedtime    . hydrochlorothiazide (HYDRODIURIL) 25 MG tablet Take 1 tablet (25 mg total) by mouth every morning. (Patient taking differently: Take 25 mg by mouth daily. ) 90 tablet 3  . insulin NPH Human (HUMULIN N,NOVOLIN N) 100 UNIT/ML injection Inject 28 Units into the skin 2 (two) times daily.    . Insulin Syringe-Needle U-100 (INSULIN SYRINGE 1CC/31GX5/16") 31G X 5/16" 1 ML MISC Use to inject insulin two times per day. 200 each 2  . IRON PO Take 65 mg by mouth.     . lamoTRIgine (LAMICTAL) 200 MG tablet Take 1 tablet (200 mg total) by mouth daily at 12 noon. (Patient not taking: Reported on 11/01/2016) 90 tablet 3  . Lancets (ACCU-CHEK MULTICLIX) lancets Use to check blood sugar two time per day. 200 each 2  . levothyroxine (SYNTHROID, LEVOTHROID) 50 MCG tablet Take 1 tablet (50 mcg total) by mouth daily. --- Office visit needed for further refills (Patient not taking: Reported on 11/01/2016) 90 tablet 0  . lisinopril (PRINIVIL,ZESTRIL) 10 MG tablet Take 1 tablet (10 mg total) by mouth daily. 90 tablet 3  . meloxicam (MOBIC) 15 MG tablet TAKE 1 TABLET EVERY DAY (Patient not taking: Reported on 11/01/2016) 90 tablet 0  . metFORMIN (GLUCOPHAGE-XR) 500 MG 24 hr tablet Take 1 tablet (500 mg total) by mouth 2 (two) times daily with a meal. 180 tablet 3  . Multiple Vitamin (MULTIVITAMIN WITH MINERALS) TABS Take 1 tablet by mouth every evening.    Marland Kitchen omeprazole (PRILOSEC) 20 MG capsule Take 1 capsule (20 mg total) by mouth daily. Take 30 minutes before a meal (Patient taking differently: Take 20 mg by mouth daily before lunch. Take 30 minutes before a meal) 90 capsule 3  . oxybutynin (DITROPAN-XL) 10 MG 24 hr tablet Take 1 tablet (10 mg total) by mouth every evening. 90 tablet 3  . potassium citrate (UROCIT-K) 10 MEQ (1080 MG) SR tablet TAKE 2 TABLETS TWICE DAILY (Patient not taking: Reported on 11/01/2016) 360 tablet 0  . prazosin (MINIPRESS) 1 MG capsule Take 3 capsules (3 mg total) by mouth at bedtime. 270 capsule 3  . ranitidine (ZANTAC) 150 MG tablet Take 1 tablet (150 mg total) by mouth 2 (two) times daily. 180 tablet 3  . sertraline (ZOLOFT) 50 MG tablet Take 1 tablet (50 mg total) by mouth daily. 90 tablet 3  .  TURMERIC PO Take 538 mg by mouth.     No current facility-administered medications on file prior to visit.    No Known Allergies Family History  Problem Relation Age of Onset  . Hypertension Mother   . Lung cancer Mother        non-smoker  .  Diabetes Father   . Hypertension Father   . Hyperlipidemia Father   . Heart attack Other   . Coronary artery disease Other   . Hypertension Sister   . Hypertension Brother   . Hypertension Brother   . Hypertension Brother   . Hypertension Sister   . Hypertension Sister   . Diabetes Daughter    PE: BP (!) 152/68 (BP Location: Left Arm, Patient Position: Sitting, Cuff Size: Normal)   Pulse 72   Wt 246 lb (111.6 kg)   SpO2 97%   BMI 44.99 kg/m  Wt Readings from Last 3 Encounters:  01/20/17 246 lb (111.6 kg)  11/01/16 241 lb (109.3 kg)  09/09/16 258 lb (117 kg)   Constitutional: overweight, in NAD Eyes: PERRLA, EOMI, no exophthalmos ENT: moist mucous membranes, no thyromegaly, no cervical lymphadenopathy Cardiovascular: RRR, No MRG Respiratory: CTA B Gastrointestinal: abdomen soft, NT, ND, BS+ Musculoskeletal: no deformities, strength intact in all 4 Skin: moist, warm, no rashes Neurological: no tremor with outstretched hands, DTR normal in all 4  ASSESSMENT: 1. DM2, insulin-dependent, uncontrolled, with complications - dCHF - PN  2. Obesity class 3  3. Hypothyroidism  PLAN:  1. Patient with long-standing, uncontrolled diabetes, on oral antidiabetic regimen and intermediate acting insulin (NPH), now with many low blood sugars in her carefully  Log, that we reviewed today. She started to have more low blood sugars in the last month, but did not contact me about them. Lowest sugars were in the 40s. - we Will reduce her NPH dose at both b'fast and bedtime by almost 50% - I strongly advised her to let me know before our next visit if she continues to experience low blood sugars or if her sugars start to trend up - She has an epidural injection coming up and I advised her that she may need higher blood sugars at that time, if this contains a steroid - I suggested to:  Patient Instructions  Please continue: - Metformin ER 500 mg 2x a day with meals - Invokana 100 mg before  b'fast  Please decrease: - NPH 18 units in am and 18 units at bedtime  Please return in 3 months with your sugar log.  - continue checking sugars at different times of the day - check 2x a day, rotating checks - advised for yearly eye exams >> she is UTD - had flu shot already - Will see PCP tomorrow and I advised her to ask him to send me the results of her lipid panel if this is checked - Return to clinic in 3 mo with sugar log   2. Obesity  - gained weight since last visit, but this could have been 2/2 frequent lows >> increased eating. Will reduce insulin by 20 units >> this will help with weight loss  3. Hypothyroidism - Reviewed latest TSH level from 11/19/2016 and this was higher than 4 - She is on levothyroxine 50 g daily, but takes this along with multivitamin and close to iron so I advised her to move these more than 4 hours later - This is followed by PCP. She will see him tomorrow  Philemon Kingdom, MD PhD [facility]  Endocrinology

## 2017-01-20 NOTE — Patient Instructions (Signed)
Please continue: - Metformin ER 500 mg 2x a day with meals - Invokana 100 mg before b'fast  Please decrease: - NPH 18 units in am and 18 units at bedtime  Please return in 3 months with your sugar log.

## 2017-01-21 ENCOUNTER — Telehealth: Payer: Self-pay | Admitting: Internal Medicine

## 2017-01-21 NOTE — Telephone Encounter (Signed)
Patient ask you to call her, she forgot the name of the medications Gherghe gave her

## 2017-01-21 NOTE — Telephone Encounter (Signed)
Called patient, LVM advising of the information for the medications. Gave call back number if she had questions.

## 2017-01-31 ENCOUNTER — Ambulatory Visit
Admission: RE | Admit: 2017-01-31 | Discharge: 2017-01-31 | Disposition: A | Discharge status: Home / Self Care | Payer: Medicare HMO | Source: Ambulatory Visit | Attending: Internal Medicine | Admitting: Internal Medicine

## 2017-01-31 ENCOUNTER — Ambulatory Visit: Admission: RE | Admit: 2017-01-31 | Payer: Medicare HMO | Source: Ambulatory Visit

## 2017-01-31 DIAGNOSIS — R234 Changes in skin texture: Secondary | ICD-10-CM

## 2017-02-01 ENCOUNTER — Other Ambulatory Visit: Payer: Medicare HMO

## 2017-02-01 ENCOUNTER — Telehealth: Payer: Self-pay | Admitting: Internal Medicine

## 2017-02-01 ENCOUNTER — Telehealth: Payer: Self-pay

## 2017-02-01 ENCOUNTER — Ambulatory Visit: Payer: Medicare HMO | Admitting: Internal Medicine

## 2017-02-01 NOTE — Telephone Encounter (Signed)
Caller Name: Riniyah Speich    Relationship to Patient: self   Best Number: [phone]    Pharmacy:  [facility] on [address]  Reason for call: patient wants Dr. Cruzita Lederer to know that her sugars are mostly low in the mornings since Dr. Cruzita Lederer changed it.  This mornings reading was 47.  Yesterday was 52.  Please advise.

## 2017-02-01 NOTE — Telephone Encounter (Signed)
Called patient, LVM advising of MD note. Left call back number.

## 2017-02-01 NOTE — Telephone Encounter (Signed)
Called and LVM advising patient of MD note, Left call back number.

## 2017-02-01 NOTE — Telephone Encounter (Signed)
Please decrease the NPH at night from 18 to 12 units. Let us know how this goes.

## 2017-02-01 NOTE — Telephone Encounter (Signed)
Please advise. Thank you

## 2017-02-04 ENCOUNTER — Other Ambulatory Visit: Payer: Medicare HMO

## 2017-02-10 DIAGNOSIS — M4316 Spondylolisthesis, lumbar region: Secondary | ICD-10-CM | POA: Insufficient documentation

## 2017-02-10 DIAGNOSIS — M48061 Spinal stenosis, lumbar region without neurogenic claudication: Secondary | ICD-10-CM | POA: Insufficient documentation

## 2017-02-22 ENCOUNTER — Other Ambulatory Visit: Payer: Self-pay | Admitting: Endocrinology

## 2017-03-17 DIAGNOSIS — R1900 Intra-abdominal and pelvic swelling, mass and lump, unspecified site: Secondary | ICD-10-CM | POA: Insufficient documentation

## 2017-03-18 ENCOUNTER — Ambulatory Visit: Payer: Medicare HMO | Admitting: Internal Medicine

## 2017-03-21 ENCOUNTER — Other Ambulatory Visit: Payer: Self-pay | Admitting: Internal Medicine

## 2017-03-25 DIAGNOSIS — I959 Hypotension, unspecified: Secondary | ICD-10-CM | POA: Insufficient documentation

## 2017-04-25 ENCOUNTER — Encounter: Payer: Self-pay | Admitting: Internal Medicine

## 2017-04-25 ENCOUNTER — Ambulatory Visit: Payer: Medicare HMO | Admitting: Internal Medicine

## 2017-04-25 VITALS — BP 118/72 | HR 68 | Resp 16 | Ht 63.0 in | Wt 235.6 lb

## 2017-04-25 DIAGNOSIS — E1159 Type 2 diabetes mellitus with other circulatory complications: Secondary | ICD-10-CM | POA: Diagnosis not present

## 2017-04-25 DIAGNOSIS — E1165 Type 2 diabetes mellitus with hyperglycemia: Secondary | ICD-10-CM | POA: Diagnosis not present

## 2017-04-25 DIAGNOSIS — E7800 Pure hypercholesterolemia, unspecified: Secondary | ICD-10-CM

## 2017-04-25 DIAGNOSIS — E039 Hypothyroidism, unspecified: Secondary | ICD-10-CM | POA: Diagnosis not present

## 2017-04-25 LAB — POCT GLYCOSYLATED HEMOGLOBIN (HGB A1C): Hemoglobin A1C: 6.3

## 2017-04-25 MED ORDER — ACCU-CHEK AVIVA PLUS W/DEVICE KIT: PACK | 2 refills | Status: DC

## 2017-04-25 MED ORDER — GLUCOSE BLOOD VI STRP: ORAL_STRIP | 3 refills | Status: DC

## 2017-04-25 NOTE — Progress Notes (Signed)
Patient ID: Brittany Acosta, female   DOB: September 14, 1950, 67 y.o.   MRN: [id number]   HPI: Brittany Acosta is a 67 y.o.-year-old female, referred by her PCP, Dr. Franne Forts, for management of DM2, dx in 2002, insulin-dependent since ~2004, uncontrolled, with complications (dCHF, PN). She was initially seen by Dr. Loanne Drilling, then Dr. Dwyane Dee, with last visit 09/2016, last visit with me 3 months ago.  She is here with her husband who offers part of the history about her past medical history and blood sugars.  She had back surgery 03/03/2017.  Last hemoglobin A1c was: 11/19/2016: HbA1c 6.9% Lab Results  Component Value Date   HGBA1C 6.8 11/01/2016   HGBA1C 7.3 (H) 06/28/2016   HGBA1C 6.6 02/16/2016   Pt is on a regimen of: - Metformin ER 1000 mg 2x a day, with meals >> diarrhea >> 500 mg twice a day - Invokana 100 mg in a.m. - NPH 35 in am and 25 units at night >> 28 units 2x a day >> 18 units in am and 12 units at night  Pt checks her sugars twice a day: - am: 116-163, 210 in 09/2016 >> 56-128 >> 86-159, 220 on ABx - 2h after b'fast: 186 >> n/c >> 140, 166 - before lunch: 56 >> 44x1 >> 59, 80 - 2h after lunch: n/c - before dinner: 52, 66, 87-142, 255 >> 48-90, 135, 159 >> 70-156, 197 - 2h after dinner: 88-145, 243 >> n/c >> 123-153, 196 - bedtime: n/c >> 65-113 > 156 - nighttime: n/c Lowest sugar was 40s >> 44 >> 59; she has hypoglycemia awareness in the 70s.  Highest sugar was 200s >> 159 >> 220.  Glucometer: Accu-Chek  Meals are: - Breakfast: cereal - cheerios, 2% milk - Lunch: sandwich - Dinner: salads, meat + starch - Snacks: fruit, fig newtons  - No CKD, last BUN/creatinine:  11/19/2016: BUN/Cr 11/0.78 Lab Results  Component Value Date   BUN 20 09/09/2016   BUN 20 06/28/2016   CREATININE 0.89 09/09/2016   CREATININE 1.02 (H) 07/01/2016  On lisinopril - + HL; last set of lipids: Lab Results  Component Value Date   CHOL 157 06/26/2015   HDL 55.40 06/26/2015   LDLCALC 89 06/26/2015    LDLDIRECT 56.4 06/11/2011   TRIG 64.0 06/26/2015   CHOLHDL 3 06/26/2015   - last eye exam was in 10/2016: No DR - She has numbness and tingling in her feet. Last Foot exam was in 09/2016.  On Neurontin  600 mg three times daily + 900 mg at bedtime.  She also has hypothyroidism: - on LT4 50 mcg daily - At last visit, she was taking th LT4 close to MVI and iron PO >> discussed to move this at least 4 hours later - Latest TSH: 11/19/2016: TSH 4.735  Lab Results  Component Value Date   TSH 2.81 09/09/2016   ROS: Constitutional: + weight loss, + fatigue, + cold intolerance Eyes: no blurry vision, no xerophthalmia ENT: no sore throat, no nodules palpated in throat, no dysphagia/odynophagia, no hoarseness Cardiovascular: no CP/SOB/palpitations/+ leg swelling Respiratory: no cough/SOB Gastrointestinal: + N/no V/+ D/no C/no heartburn Musculoskeletal: + muscle aches/+ joint aches Skin: no rashes, + hair loss Neurological: no tremors/numbness/tingling/dizziness, no HA  I reviewed pt's medications, allergies, PMH, social hx, family hx, and changes were documented in the history of present illness. Otherwise, unchanged from my initial visit note.  Past Medical History:  Diagnosis Date  . ALLERGIC RHINITIS   . Arthritis   .  BIPOLAR DISORDER UNSPECIFIED    depression  . Cancer ([place])    skin cancer on left leg  . Chronic diastolic heart failure ([place])   . Depression   . FIBROMYALGIA   . GERD (gastroesophageal reflux disease)   . GOITER, MULTINODULAR   . Headache   . History of gout    "haven't had it in several years" (02/08/2012)  . History of kidney stones   . History of wrist fracture    rt wrist  . HYPERCHOLESTEROLEMIA   . Hypertension   . HYPOTHYROIDISM   . Iron deficiency anemia   . Kidney stones    sees urologist @ [place]  . Morbid obesity ([place])   . 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 PONV (postoperative nausea and vomiting)   . PTSD (post-traumatic stress  disorder)   . RBBB   . Sleep apnea 7/14   mild osa-did not need cpap -dr Gwenette Greet  . Type II diabetes mellitus ([place])   . Wears glasses    Past Surgical History:  Procedure Laterality Date  . ABDOMINAL HYSTERECTOMY  1976  . BREAST EXCISIONAL BIOPSY    . BREAST SURGERY    . CARDIAC CATHETERIZATION  2001   sees Dr Peter [place]  . CATARACT EXTRACTION  2014  . CATARACT EXTRACTION, BILATERAL  02/2011   epps  . CHOLECYSTECTOMY  1985  . EXCISIONAL HEMORRHOIDECTOMY     "dr cut out in his office" (02/08/2012)  . INCISIONAL BREAST BIOPSY  2000   right  . JOINT REPLACEMENT     rt knee  . KNEE ARTHROSCOPY  08/2009   right  . KNEE ARTHROSCOPY WITH MEDIAL MENISECTOMY     left  . Left Cystoscopy   1990  . LEFT OOPHORECTOMY  1980  . Lithotripsy (L) Kidney  1997  . LUMBAR LAMINECTOMY/DECOMPRESSION MICRODISCECTOMY Left 07/01/2016   Procedure: Laminectomy for synovial cyst - left - Lumbar four-five;  Surgeon: Earnie Larsson, MD;  Location: [facility];  Service: Neurosurgery;  Laterality: Left;  . PARTIAL MASTECTOMY WITH NEEDLE LOCALIZATION Left 09/01/2012   Procedure: PARTIAL MASTECTOMY WITH NEEDLE LOCALIZATION;  Surgeon: Adin Hector, MD;  Location: [facility];  Service: General;  Laterality: Left;  . Percitania stone removed (L) Kidney  1992  . Right nasal surgery  08/1988  . Right sinus removed  08/1989   tooth partial  . ROTATOR CUFF REPAIR  2013   right shoulder x 2  . SHOULDER ARTHROSCOPY WITH ROTATOR CUFF REPAIR AND SUBACROMIAL DECOMPRESSION Left 08/20/2013   Procedure: SHOULDER ARTHROSCOPY  AND SUBACROMIAL DECOMPRESSION;  Surgeon: Nita Sells, MD;  Location: [facility];  Service: Orthopedics;  Laterality: Left;  Left shoulder arthroscopy, debridement, subacromial decompression, distal clavical resection  . THYROIDECTOMY  04/22/2011   Procedure: THYROIDECTOMY;  Surgeon: Onnie Graham, MD;  Location: [facility];  Service: ENT;  Laterality: N/A;  TOTAL THYROIDECOTMY  . TOTAL KNEE  ARTHROPLASTY  06/18/2011   Procedure: TOTAL KNEE ARTHROPLASTY;  Surgeon: Kerin Salen, MD;  Location: [facility];  Service: Orthopedics;  Laterality: Left;  DEPUY  . TOTAL KNEE ARTHROPLASTY  02/07/2012   Procedure: TOTAL KNEE ARTHROPLASTY;  Surgeon: Kerin Salen, MD;  Location: [facility] [place];  Service: Orthopedics;  Laterality: Right;  . TUBAL LIGATION  1972   Social History   Socioeconomic History  . Marital status: Married    Spouse name: Not on file  . Number of children: 2  . Years of education: 55  . Highest  education level: Not on file  Social Needs  . Financial resource strain: Not on file  . Food insecurity - worry: Not on file  . Food insecurity - inability: Not on file  . Transportation needs - medical: Not on file  . Transportation needs - non-medical: Not on file  Occupational History  . Occupation: disability    Employer: RETIRED  Tobacco Use  . Smoking status: Former Smoker    Packs/day: 1.00    Years: 6.00    Pack years: 6.00    Types: Cigarettes    Last attempt to quit: 04/06/1983    Years since quitting: 34.0  . Smokeless tobacco: Never Used  . Tobacco comment: Married, lives with spouse. Disable- 2 grown kids-6 g-kids  Substance and Sexual Activity  . Alcohol use: No    Comment: none  . Drug use: No  . Sexual activity: Not Currently  Other Topics Concern  . Not on file  Social History Narrative   Married, lives with spouse - 2 adult children   Disabled      No regular exercise   Current Outpatient Medications on File Prior to Visit  Medication Sig Dispense Refill  . ACCU-CHEK AVIVA PLUS test strip USE TO CHECK BLOOD SUGAR TWO TIMES PER DAY. 180 each 2  . Ascorbic Acid (VITAMIN C) 500 MG CAPS Take by mouth.    . Biotin 10 MG CAPS Take 1 capsule by mouth daily.    . Blood Glucose Monitoring Suppl (ACCU-CHEK AVIVA PLUS) w/Device KIT USE TO CHECK BLOOD SUGAR 2 TIMES PER DAY. 1 kit 2  . canagliflozin (INVOKANA) 100 MG TABS tablet 1 tablet before breakfast 90 tablet 3   . Coenzyme Q10 120 MG CAPS Take 120 mg by mouth daily.    . Ergocalciferol (VITAMIN D2) 2000 units TABS Take by mouth.    . estradiol (ESTRACE) 0.5 MG tablet Take 1 tablet (0.5 mg total) by mouth daily. 90 tablet 3  . furosemide (LASIX) 40 MG tablet Take 1 tablet (40 mg total) by mouth daily. 90 tablet 3  . gabapentin (NEURONTIN) 600 MG tablet Take 600-900 mg by mouth 4 (four) times daily. 657m three times daily 9060mat bedtime    . hydrochlorothiazide (HYDRODIURIL) 25 MG tablet Take 1 tablet (25 mg total) by mouth every morning. (Patient taking differently: Take 25 mg by mouth daily. ) 90 tablet 3  . insulin NPH Human (HUMULIN N,NOVOLIN N) 100 UNIT/ML injection Inject 0.18 mLs (18 Units total) into the skin 2 (two) times daily. 20 mL 11  . Insulin Syringe-Needle U-100 (INSULIN SYRINGE 1CC/31GX5/16") 31G X 5/16" 1 ML MISC Use to inject insulin two times per day. 200 each 2  . IRON PO Take 65 mg by mouth.    . lamoTRIgine (LAMICTAL) 200 MG tablet Take 1 tablet (200 mg total) by mouth daily at 12 noon. 90 tablet 3  . Lancets (ACCU-CHEK MULTICLIX) lancets Use to check blood sugar two time per day. 200 each 2  . levothyroxine (SYNTHROID, LEVOTHROID) 50 MCG tablet Take 1 tablet (50 mcg total) by mouth daily. --- Office visit needed for further refills 90 tablet 0  . lisinopril (PRINIVIL,ZESTRIL) 10 MG tablet Take 1 tablet (10 mg total) by mouth daily. 90 tablet 3  . Melatonin 10 MG TABS Take 10 mg by mouth at bedtime.    . meloxicam (MOBIC) 15 MG tablet TAKE 1 TABLET EVERY DAY 90 tablet 0  . metFORMIN (GLUCOPHAGE-XR) 500 MG 24 hr tablet Take 1  tablet (500 mg total) by mouth 2 (two) times daily with a meal. 180 tablet 3  . Multiple Vitamin (MULTIVITAMIN WITH MINERALS) TABS Take 1 tablet by mouth every evening.    Marland Kitchen omeprazole (PRILOSEC) 20 MG capsule Take 1 capsule (20 mg total) by mouth daily. Take 30 minutes before a meal (Patient taking differently: Take 20 mg by mouth daily before lunch. Take 30  minutes before a meal) 90 capsule 3  . oxybutynin (DITROPAN-XL) 10 MG 24 hr tablet Take 1 tablet (10 mg total) by mouth every evening. 90 tablet 3  . potassium citrate (UROCIT-K) 10 MEQ (1080 MG) SR tablet TAKE 2 TABLETS TWICE DAILY 360 tablet 0  . prazosin (MINIPRESS) 1 MG capsule Take 3 capsules (3 mg total) by mouth at bedtime. 270 capsule 3  . ranitidine (ZANTAC) 150 MG tablet Take 1 tablet (150 mg total) by mouth 2 (two) times daily. 180 tablet 3  . sertraline (ZOLOFT) 50 MG tablet Take 1 tablet (50 mg total) by mouth daily. 90 tablet 3  . traMADol (ULTRAM) 50 MG tablet Take 50 mg by mouth every 4 (four) hours as needed.    . TURMERIC PO Take 538 mg by mouth.     No current facility-administered medications on file prior to visit.    No Known Allergies Family History  Problem Relation Age of Onset  . Hypertension Mother   . Lung cancer Mother        non-smoker  . Diabetes Father   . Hypertension Father   . Hyperlipidemia Father   . Heart attack Other   . Coronary artery disease Other   . Hypertension Sister   . Hypertension Brother   . Hypertension Brother   . Hypertension Brother   . Hypertension Sister   . Hypertension Sister   . Diabetes Daughter    PE: BP 118/72 (BP Location: Right Arm, Patient Position: Sitting, Cuff Size: Large)   Pulse 68   Resp 16   Ht '5\' 3"'  (1.6 m)   Wt 235 lb 9.6 oz (106.9 kg)   SpO2 97%   BMI 41.73 kg/m  Wt Readings from Last 3 Encounters:  04/25/17 235 lb 9.6 oz (106.9 kg)  01/20/17 246 lb (111.6 kg)  11/01/16 241 lb (109.3 kg)   Constitutional: overweight, in NAD Eyes: PERRLA, EOMI, no exophthalmos ENT: moist mucous membranes, no thyromegaly, no cervical lymphadenopathy Cardiovascular: RRR, No MRG Respiratory: CTA B Gastrointestinal: abdomen soft, NT, ND, BS+ Musculoskeletal: no deformities, strength intact in all 4 Skin: moist, warm, no rashes Neurological: no tremor with outstretched hands, DTR normal in all 4  ASSESSMENT: 1.  DM2, insulin-dependent, uncontrolled, with complications - dCHF - PN  2. Obesity class 3  3. Hypothyroidism  4. HL  PLAN:  1. Patient with long-standing, uncontrolled, type 2 diabetes, on oral antidiabetic regimen and intermediate acting insulin (NPH), with low blood sugars at last visit and also since last visit, after which we needed to decrease her NPH dose. - She continues to lose weight >> lower insulin resistance >> lower blood sugars especially before dinner we will decrease NPH in the morning >>  - I suggested to:  Patient Instructions  Please continue: - Metformin ER 500 mg 2x a day with meals - Invokana 100 mg before b'fast  Please decrease: - NPH to 12 units in a.m. and 12 units at bedtime  Please return in 3 months with your sugar log.  - today, HbA1c is 6.3% (better)  - continue checking  sugars at different times of the day - check 2x a day, rotating checks - advised for yearly eye exams >> she is UTD - Return to clinic in 3 mo with sugar log    2. Obesity  - lost weight reportedly 2/2 diarrhea, being sick - 11 lbs since last visit  3. HL - Reviewed latest panel, with all cholesterol fractions at goal; however, she is due for another check - need records from PCP - Not on statins   4. Hypothyroidism - now taking LT4 50 mcg daily correctly - she is due for a TSH >> per PCP   Philemon Kingdom, MD PhD [facility]

## 2017-04-25 NOTE — Patient Instructions (Addendum)
Please continue: - Metformin ER 500 mg 2x a day with meals - Invokana 100 mg before b'fast  Please decrease: - NPH to 12 units in a.m. and 12 units at bedtime  Please return in 3 months with your sugar log.

## 2017-07-25 ENCOUNTER — Ambulatory Visit: Payer: Medicare HMO | Admitting: Internal Medicine

## 2017-08-11 ENCOUNTER — Ambulatory Visit: Payer: Medicare HMO | Admitting: Internal Medicine

## 2017-08-15 ENCOUNTER — Ambulatory Visit: Payer: Medicare HMO | Admitting: Internal Medicine

## 2017-08-15 NOTE — Progress Notes (Deleted)
Patient ID: Brittany Acosta, female   DOB: Aug 25, 1950, 67 y.o.   MRN: [id number]   HPI: Brittany Acosta is a 67 y.o.-year-old female, referred by her PCP, Dr. Franne Forts, for management of DM2, dx in 2002, insulin-dependent since ~2004, uncontrolled, with complications (dCHF, PN). She was initially seen by Dr. Loanne Drilling, then Dr. Dwyane Dee, with last visit 09/2016.  Last visit with me 4 months ago.  She is here with her husband who offers part of the history, especially about her past medical history and blood sugars.   DM2: Last hemoglobin A1c was: Lab Results  Component Value Date   HGBA1C 6.3 04/25/2017   HGBA1C 6.8 11/01/2016   HGBA1C 7.3 (H) 06/28/2016  11/19/2016: HbA1c 6.9%  Pt is on a regimen of: - Metformin ER 1000 mg 2x a day, with meals >> diarrhea >> 500 mg 2x a day - Invokana 100 mg in a.m. - NPH 35 in am and 25 units at night >> 28 units 2x a day >> 18 units in am and 12 units at night >> 12 units 2x a day  Pt checks her sugars 2x a day: - am:56-128 >> 86-159, 220 on ABx - 2h after b'fast: 186 >> n/c >> 140, 166 - before lunch: 56 >> 44x1 >> 59, 80 - 2h after lunch: n/c - before dinner: 48-90, 135, 159 >> 70-156, 197 - 2h after dinner: 88-145, 243 >> n/c >> 123-153, 196 - bedtime: n/c >> 65-113 > 156 - nighttime: n/c Lowest sugar was 40s >> 44 >> 59 >> ***; she has hypoglycemia awareness in the 70s.  Highest sugar was 220 >> ***.  Glucometer: Accu-Chek  Meals are: - Breakfast: cereal - cheerios, 2% milk - Lunch: sandwich - Dinner: salads, meat + starch - Snacks: fruit, fig newtons  - No CKD, last BUN/creatinine:  11/19/2016: BUN/Cr 11/0.78 Lab Results  Component Value Date   BUN 20 09/09/2016   BUN 20 06/28/2016   CREATININE 0.89 09/09/2016   CREATININE 1.02 (H) 07/01/2016  On lisinopril. - + HL; last set of lipids: Lab Results  Component Value Date   CHOL 157 06/26/2015   HDL 55.40 06/26/2015   LDLCALC 89 06/26/2015   LDLDIRECT 56.4 06/11/2011   TRIG 64.0  06/26/2015   CHOLHDL 3 06/26/2015  She is not on a statin. - last eye exam was in 10/2016: No DR - + numbness and tingling in her feet. Last Foot exam was in 09/2016.  On Neurontin 600 mg 3X daily +900 mg at bedtime  She also has hypothyroidism: - on LT4 50 mcg daily - In the past, she was taking the levothyroxine close to multivitamins and iron >> we moved these >4 hours later  Reviewed latest TSH: 11/19/2016: TSH 4.735  Lab Results  Component Value Date   TSH 2.81 09/09/2016   She had back surgery 03/03/2017.  ROS: Constitutional: no weight gain/no weight loss, no fatigue, no subjective hyperthermia, no subjective hypothermia Eyes: no blurry vision, no xerophthalmia ENT: no sore throat, no nodules palpated in throat, no dysphagia, no odynophagia, no hoarseness Cardiovascular: no CP/no SOB/no palpitations/no leg swelling Respiratory: no cough/no SOB/no wheezing Gastrointestinal: no N/no V/no D/no C/no acid reflux Musculoskeletal: no muscle aches/no joint aches Skin: no rashes, no hair loss Neurological: no tremors/no numbness/no tingling/no dizziness  I reviewed pt's medications, allergies, PMH, social hx, family hx, and changes were documented in the history of present illness. Otherwise, unchanged from my initial visit note.  Past Medical History:  Diagnosis  Date  . ALLERGIC RHINITIS   . Arthritis   . BIPOLAR DISORDER UNSPECIFIED    depression  . Cancer ([place])    skin cancer on left leg  . Chronic diastolic heart failure ([place])   . Depression   . FIBROMYALGIA   . GERD (gastroesophageal reflux disease)   . GOITER, MULTINODULAR   . Headache   . History of gout    "haven't had it in several years" (02/08/2012)  . History of kidney stones   . History of wrist fracture    rt wrist  . HYPERCHOLESTEROLEMIA   . Hypertension   . HYPOTHYROIDISM   . Iron deficiency anemia   . Kidney stones    sees urologist @ [facility]  . Morbid obesity ([place])   . 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 PONV (postoperative nausea and vomiting)   . PTSD (post-traumatic stress disorder)   . RBBB   . Sleep apnea 7/14   mild osa-did not need cpap -dr Gwenette Greet  . Type II diabetes mellitus ([place])   . Wears glasses    Past Surgical History:  Procedure Laterality Date  . ABDOMINAL HYSTERECTOMY  1976  . BREAST EXCISIONAL BIOPSY    . BREAST SURGERY    . CARDIAC CATHETERIZATION  2001   sees Dr Peter Martinique  . CATARACT EXTRACTION  2014  . CATARACT EXTRACTION, BILATERAL  02/2011   epps  . CHOLECYSTECTOMY  1985  . EXCISIONAL HEMORRHOIDECTOMY     "dr cut out in his office" (02/08/2012)  . INCISIONAL BREAST BIOPSY  2000   right  . JOINT REPLACEMENT     rt knee  . KNEE ARTHROSCOPY  08/2009   right  . KNEE ARTHROSCOPY WITH MEDIAL MENISECTOMY     left  . Left Cystoscopy   1990  . LEFT OOPHORECTOMY  1980  . Lithotripsy (L) Kidney  1997  . LUMBAR LAMINECTOMY/DECOMPRESSION MICRODISCECTOMY Left 07/01/2016   Procedure: Laminectomy for synovial cyst - left - Lumbar four-five;  Surgeon: Earnie Larsson, MD;  Location: [facility];  Service: Neurosurgery;  Laterality: Left;  . PARTIAL MASTECTOMY WITH NEEDLE LOCALIZATION Left 09/01/2012   Procedure: PARTIAL MASTECTOMY WITH NEEDLE LOCALIZATION;  Surgeon: Adin Hector, MD;  Location: [facility];  Service: General;  Laterality: Left;  . Percitania stone removed (L) Kidney  1992  . Right nasal surgery  08/1988  . Right sinus removed  08/1989   tooth partial  . ROTATOR CUFF REPAIR  2013   right shoulder x 2  . SHOULDER ARTHROSCOPY WITH ROTATOR CUFF REPAIR AND SUBACROMIAL DECOMPRESSION Left 08/20/2013   Procedure: SHOULDER ARTHROSCOPY  AND SUBACROMIAL DECOMPRESSION;  Surgeon: Nita Sells, MD;  Location: [facility];  Service: Orthopedics;  Laterality: Left;  Left shoulder arthroscopy, debridement, subacromial decompression, distal clavical resection  . THYROIDECTOMY  04/22/2011   Procedure: THYROIDECTOMY;  Surgeon: Onnie Graham,  MD;  Location: [facility];  Service: ENT;  Laterality: N/A;  TOTAL THYROIDECOTMY  . TOTAL KNEE ARTHROPLASTY  06/18/2011   Procedure: TOTAL KNEE ARTHROPLASTY;  Surgeon: Kerin Salen, MD;  Location: [facility];  Service: Orthopedics;  Laterality: Left;  DEPUY  . TOTAL KNEE ARTHROPLASTY  02/07/2012   Procedure: TOTAL KNEE ARTHROPLASTY;  Surgeon: Kerin Salen, MD;  Location: [facility];  Service: Orthopedics;  Laterality: Right;  . TUBAL LIGATION  1972   Social History   Socioeconomic History  . Marital status: Married    Spouse name: Not on file  . Number  of children: 2  . Years of education: 25  . Highest education level: Not on file  Occupational History  . Occupation: disability    Employer: RETIRED  Social Needs  . Financial resource strain: Not on file  . Food insecurity:    Worry: Not on file    Inability: Not on file  . Transportation needs:    Medical: Not on file    Non-medical: Not on file  Tobacco Use  . Smoking status: Former Smoker    Packs/day: 1.00    Years: 6.00    Pack years: 6.00    Types: Cigarettes    Last attempt to quit: 04/06/1983    Years since quitting: 34.3  . Smokeless tobacco: Never Used  . Tobacco comment: Married, lives with spouse. Disable- 2 grown kids-6 g-kids  Substance and Sexual Activity  . Alcohol use: No    Comment: none  . Drug use: No  . Sexual activity: Not Currently  Lifestyle  . Physical activity:    Days per week: Not on file    Minutes per session: Not on file  . Stress: Not on file  Relationships  . Social connections:    Talks on phone: Not on file    Gets together: Not on file    Attends religious service: Not on file    Active member of club or organization: Not on file    Attends meetings of clubs or organizations: Not on file    Relationship status: Not on file  . Intimate partner violence:    Fear of current or ex partner: Not on file    Emotionally abused: Not on file    Physically abused: Not on file    Forced sexual  activity: Not on file  Other Topics Concern  . Not on file  Social History Narrative   Married, lives with spouse - 2 adult children   Disabled      No regular exercise   Current Outpatient Medications on File Prior to Visit  Medication Sig Dispense Refill  . Blood Glucose Monitoring Suppl (ACCU-CHEK AVIVA PLUS) w/Device KIT USE TO CHECK BLOOD SUGAR 2 TIMES PER DAY. 1 kit 2  . canagliflozin (INVOKANA) 100 MG TABS tablet 1 tablet before breakfast 90 tablet 3  . cetirizine (ZYRTEC) 10 MG tablet Take by mouth.    . Ergocalciferol (VITAMIN D2) 2000 units TABS Take by mouth.    . estradiol (ESTRACE) 0.5 MG tablet Take 1 tablet (0.5 mg total) by mouth daily. 90 tablet 3  . furosemide (LASIX) 40 MG tablet Take 1 tablet (40 mg total) by mouth daily. 90 tablet 3  . gabapentin (NEURONTIN) 600 MG tablet Take 600-900 mg by mouth 4 (four) times daily. 610m three times daily 9012mat bedtime    . glucose blood (ACCU-CHEK AVIVA PLUS) test strip USE TO CHECK BLOOD SUGAR TWO TIMES PER DAY. 200 each 3  . hydrochlorothiazide (HYDRODIURIL) 25 MG tablet Take 1 tablet (25 mg total) by mouth every morning. (Patient taking differently: Take 25 mg by mouth daily. ) 90 tablet 3  . HYDROcodone-acetaminophen (NORCO/VICODIN) 5-325 MG tablet     . insulin NPH Human (HUMULIN N,NOVOLIN N) 100 UNIT/ML injection Inject 0.18 mLs (18 Units total) into the skin 2 (two) times daily. 20 mL 11  . Insulin Syringe-Needle U-100 (INSULIN SYRINGE 1CC/31GX5/16") 31G X 5/16" 1 ML MISC Use to inject insulin two times per day. 200 each 2  . IRON PO Take 65 mg by mouth.    .Marland Kitchen  lamoTRIgine (LAMICTAL) 200 MG tablet Take 1 tablet (200 mg total) by mouth daily at 12 noon. 90 tablet 3  . Lancets (ACCU-CHEK MULTICLIX) lancets Use to check blood sugar two time per day. 200 each 2  . levothyroxine (SYNTHROID, LEVOTHROID) 50 MCG tablet Take 1 tablet (50 mcg total) by mouth daily. --- Office visit needed for further refills 90 tablet 0  .  lisinopril (PRINIVIL,ZESTRIL) 10 MG tablet Take 1 tablet (10 mg total) by mouth daily. 90 tablet 3  . meloxicam (MOBIC) 15 MG tablet TAKE 1 TABLET EVERY DAY 90 tablet 0  . metFORMIN (GLUCOPHAGE-XR) 500 MG 24 hr tablet Take 1 tablet (500 mg total) by mouth 2 (two) times daily with a meal. 180 tablet 3  . omeprazole (PRILOSEC) 20 MG capsule Take 1 capsule (20 mg total) by mouth daily. Take 30 minutes before a meal (Patient taking differently: Take 20 mg by mouth daily before lunch. Take 30 minutes before a meal) 90 capsule 3  . oxybutynin (DITROPAN-XL) 10 MG 24 hr tablet Take 1 tablet (10 mg total) by mouth every evening. 90 tablet 3  . potassium citrate (UROCIT-K) 10 MEQ (1080 MG) SR tablet TAKE 2 TABLETS TWICE DAILY 360 tablet 0  . prazosin (MINIPRESS) 1 MG capsule Take 3 capsules (3 mg total) by mouth at bedtime. 270 capsule 3  . ranitidine (ZANTAC) 150 MG tablet Take 1 tablet (150 mg total) by mouth 2 (two) times daily. 180 tablet 3  . sertraline (ZOLOFT) 50 MG tablet Take 1 tablet (50 mg total) by mouth daily. 90 tablet 3  . TURMERIC PO Take 538 mg by mouth.     No current facility-administered medications on file prior to visit.    No Known Allergies Family History  Problem Relation Age of Onset  . Hypertension Mother   . Lung cancer Mother        non-smoker  . Diabetes Father   . Hypertension Father   . Hyperlipidemia Father   . Heart attack Other   . Coronary artery disease Other   . Hypertension Sister   . Hypertension Brother   . Hypertension Brother   . Hypertension Brother   . Hypertension Sister   . Hypertension Sister   . Diabetes Daughter    PE: There were no vitals taken for this visit. Wt Readings from Last 3 Encounters:  04/25/17 235 lb 9.6 oz (106.9 kg)  01/20/17 246 lb (111.6 kg)  11/01/16 241 lb (109.3 kg)   Constitutional: overweight, in NAD Eyes: PERRLA, EOMI, no exophthalmos ENT: moist mucous membranes, no thyromegaly, no cervical  lymphadenopathy Cardiovascular: RRR, No MRG Respiratory: CTA B Gastrointestinal: abdomen soft, NT, ND, BS+ Musculoskeletal: no deformities, strength intact in all 4 Skin: moist, warm, no rashes Neurological: no tremor with outstretched hands, DTR normal in all 4  ASSESSMENT: 1. DM2, insulin-dependent, uncontrolled, with complications - dCHF - PN  2. Obesity class 3  3. Hypothyroidism  4. HL  PLAN:  1. Patient with long-standing, previously uncontrolled type 2 diabetes, on oral antidiabetic regimen and intermediate acting insulin.  At last visit, she was continuing to lose weight and had lower insulin resistance, therefore, we had to lower her insulin doses due to mild hypoglycemic episodes.  - I suggested to:  Patient Instructions  Please continue: - Metformin ER 500 mg 2x a day with meals - Invokana 100 mg before b'fast - NPH 12 units 2x a day  Please return in 4 months with your sugar log.  -  today, HbA1c is 7%  - continue checking sugars at different times of the day - check 2x a day, rotating checks - advised for yearly eye exams >> she is UTD - Return to clinic in 4 mo with sugar log     2. Obesity  -Continue Invokana which should also help  3. HL -Reviewed latest lipid panel from 2017; LDL higher than before but still lower than 100 -she had another one-we will need to get records from PCP -She is not on a statin  4. Hypothyroidism -Managed by PCP -She is due for another TSH -She is not taking her levothyroxine 50 mcg daily correctly  Philemon Kingdom, MD PhD [facility] Endocrinology

## 2017-09-01 ENCOUNTER — Telehealth: Payer: Self-pay | Admitting: Internal Medicine

## 2017-09-01 NOTE — Telephone Encounter (Signed)
[employer] is calling in regards to fax that was sent over for drug therapy alert( statin therapy) to make sure we have received this paperwork    FAX [phone]

## 2017-09-01 NOTE — Telephone Encounter (Signed)
We do not prescribe cholesterol medications that is to be determined by Primary Care

## 2017-12-16 DIAGNOSIS — M1A9XX1 Chronic gout, unspecified, with tophus (tophi): Secondary | ICD-10-CM | POA: Insufficient documentation

## 2017-12-22 ENCOUNTER — Other Ambulatory Visit: Payer: Self-pay | Admitting: Internal Medicine

## 2017-12-22 DIAGNOSIS — Z1231 Encounter for screening mammogram for malignant neoplasm of breast: Secondary | ICD-10-CM

## 2018-02-01 ENCOUNTER — Ambulatory Visit: Payer: Medicare HMO

## 2018-03-20 ENCOUNTER — Ambulatory Visit
Admission: RE | Admit: 2018-03-20 | Discharge: 2018-03-20 | Disposition: A | Discharge status: Home / Self Care | Payer: Medicare HMO | Source: Ambulatory Visit | Attending: Internal Medicine | Admitting: Internal Medicine

## 2018-03-20 DIAGNOSIS — Z1231 Encounter for screening mammogram for malignant neoplasm of breast: Secondary | ICD-10-CM

## 2018-03-22 ENCOUNTER — Other Ambulatory Visit: Payer: Self-pay | Admitting: Internal Medicine

## 2018-03-22 DIAGNOSIS — R928 Other abnormal and inconclusive findings on diagnostic imaging of breast: Secondary | ICD-10-CM

## 2018-03-23 DIAGNOSIS — M26629 Arthralgia of temporomandibular joint, unspecified side: Secondary | ICD-10-CM | POA: Insufficient documentation

## 2018-03-28 ENCOUNTER — Ambulatory Visit
Admission: RE | Admit: 2018-03-28 | Discharge: 2018-03-28 | Disposition: A | Discharge status: Home / Self Care | Payer: Medicare HMO | Source: Ambulatory Visit | Attending: Internal Medicine | Admitting: Internal Medicine

## 2018-03-28 ENCOUNTER — Other Ambulatory Visit: Payer: Self-pay | Admitting: Internal Medicine

## 2018-03-28 DIAGNOSIS — R928 Other abnormal and inconclusive findings on diagnostic imaging of breast: Secondary | ICD-10-CM

## 2018-03-28 DIAGNOSIS — N6320 Unspecified lump in the left breast, unspecified quadrant: Secondary | ICD-10-CM

## 2018-03-31 ENCOUNTER — Ambulatory Visit
Admission: RE | Admit: 2018-03-31 | Discharge: 2018-03-31 | Disposition: A | Discharge status: Home / Self Care | Payer: Medicare HMO | Source: Ambulatory Visit | Attending: Internal Medicine | Admitting: Internal Medicine

## 2018-03-31 DIAGNOSIS — N6320 Unspecified lump in the left breast, unspecified quadrant: Secondary | ICD-10-CM

## 2018-04-12 ENCOUNTER — Other Ambulatory Visit: Payer: Self-pay | Admitting: Otolaryngology

## 2018-04-12 DIAGNOSIS — H9203 Otalgia, bilateral: Secondary | ICD-10-CM

## 2018-04-13 ENCOUNTER — Ambulatory Visit: Payer: Self-pay | Admitting: General Surgery

## 2018-04-13 ENCOUNTER — Other Ambulatory Visit: Payer: Self-pay | Admitting: General Surgery

## 2018-04-13 DIAGNOSIS — C50912 Malignant neoplasm of unspecified site of left female breast: Secondary | ICD-10-CM

## 2018-04-14 ENCOUNTER — Other Ambulatory Visit: Payer: Self-pay | Admitting: Otolaryngology

## 2018-04-17 ENCOUNTER — Other Ambulatory Visit: Payer: Medicare HMO

## 2018-04-17 ENCOUNTER — Ambulatory Visit
Admission: RE | Admit: 2018-04-17 | Discharge: 2018-04-17 | Disposition: A | Discharge status: Home / Self Care | Payer: Medicare HMO | Source: Ambulatory Visit | Attending: Otolaryngology | Admitting: Otolaryngology

## 2018-04-17 DIAGNOSIS — H9203 Otalgia, bilateral: Secondary | ICD-10-CM

## 2018-04-19 ENCOUNTER — Ambulatory Visit: Payer: Medicare HMO | Admitting: Radiation Oncology

## 2018-04-19 ENCOUNTER — Ambulatory Visit: Payer: Medicare HMO

## 2018-04-20 ENCOUNTER — Inpatient Hospital Stay (HOSPITAL_COMMUNITY): Admission: RE | Admit: 2018-04-20 | Payer: Medicare HMO | Source: Ambulatory Visit

## 2018-04-20 ENCOUNTER — Encounter: Payer: Self-pay | Admitting: Hematology and Oncology

## 2018-04-20 ENCOUNTER — Telehealth: Payer: Self-pay | Admitting: Hematology and Oncology

## 2018-04-20 NOTE — Telephone Encounter (Signed)
Received a new referral from Dr. Excell Seltzer at [facility] for dx of breast cancer. Pt has been cld and scheduled to see Dr. Lindi Adie on 2/6 at 1pm. Pt preferred to have been scheduled after surgery. Letter mailed.

## 2018-04-20 NOTE — Pre-Procedure Instructions (Signed)
Brittany Acosta  04/20/2018      [place], [place] - [address] [address] Phone: [phone] Fax: [phone]    Your procedure is scheduled on April 25, 2018.  Report to [facility] Admitting at 710 AM.  Call this number if you have problems the morning of surgery:  [phone]   Remember:  Do not eat or drink after midnight.  You may drink clear liquids until 610 AM .  Clear liquids allowed are:        Water, Juice (non-citric and without pulp), Clear Tea, Black Coffee only and Gatorade    Take these medicines the morning of surgery with A SIP OF WATER  Allopurinol (zyloprim) Cetirizine (zyrtec) Gabapentin (neurontin) Lamotrigine (lamictal) Levothyroxine (synthroid) Omeprazole (prilosec) Ranitidine (zantac) Sertraline (zoloft) Tamsulosin (flomax)  7 days prior to surgery STOP taking any meloxicam (mobic), Aspirin (unless otherwise instructed by your surgeon), Aleve, Naproxen, Ibuprofen, Motrin, Advil, Goody's, BC's, all herbal medications, fish oil, and all vitamins  WHAT DO I DO ABOUT MY DIABETES MEDICATION?    Marland Kitchen Do not take oral diabetes medicines (pills) the morning of surgery-metformin (glucophage).  . THE NIGHT BEFORE SURGERY, take 10 units of Novolin N insulin. (1/2 of your normal dose).       . THE MORNING OF SURGERY, take 10 units of Novolin insulin. (1/2 of your normal dose).  . The day of surgery, do not take other diabetes injectables, including Byetta (exenatide), Bydureon (exenatide ER), Victoza (liraglutide), or Trulicity (dulaglutide).  . If your CBG is greater than 220 mg/dL, you may take  of your sliding scale (correction) dose of insulin.  Reviewed and Endorsed by [facility] Patient Education Committee, August 2015    How to Manage Your Diabetes Before and After Surgery  Why is it important to control my blood sugar before and after surgery? . Improving blood sugar levels  before and after surgery helps healing and can limit problems. . A way of improving blood sugar control is eating a healthy diet by: o  Eating less sugar and carbohydrates o  Increasing activity/exercise o  Talking with your doctor about reaching your blood sugar goals . High blood sugars (greater than 180 mg/dL) can raise your risk of infections and slow your recovery, so you will need to focus on controlling your diabetes during the weeks before surgery. . Make sure that the doctor who takes care of your diabetes knows about your planned surgery including the date and location.  How do I manage my blood sugar before surgery? . Check your blood sugar at least 4 times a day, starting 2 days before surgery, to make sure that the level is not too high or low. o Check your blood sugar the morning of your surgery when you wake up and every 2 hours until you get to the Short Stay unit. . If your blood sugar is less than 70 mg/dL, you will need to treat for low blood sugar: o Do not take insulin. o Treat a low blood sugar (less than 70 mg/dL) with  cup of clear juice (cranberry or apple), 4 glucose tablets, OR glucose gel. Recheck blood sugar in 15 minutes after treatment (to make sure it is greater than 70 mg/dL). If your blood sugar is not greater than 70 mg/dL on recheck, call [phone] o  for further instructions. . Report your blood sugar to the short stay nurse when you get to  Short Stay.  . If you are admitted to the hospital after surgery: o Your blood sugar will be checked by the staff and you will probably be given insulin after surgery (instead of oral diabetes medicines) to make sure you have good blood sugar levels. o The goal for blood sugar control after surgery is 80-180 mg/dL.   [place]- Preparing For Surgery  Before surgery, you can play an important role. Because skin is not sterile, your skin needs to be as free of germs as possible. You can reduce the number of germs  on your skin by washing with CHG (chlorahexidine gluconate) Soap before surgery.  CHG is an antiseptic cleaner which kills germs and bonds with the skin to continue killing germs even after washing.    Oral Hygiene is also important to reduce your risk of infection.  Remember - BRUSH YOUR TEETH THE MORNING OF SURGERY WITH YOUR REGULAR TOOTHPASTE  Please do not use if you have an allergy to CHG or antibacterial soaps. If your skin becomes reddened/irritated stop using the CHG.  Do not shave (including legs and underarms) for at least 48 hours prior to first CHG shower. It is OK to shave your face.  Please follow these instructions carefully.   1. Shower the NIGHT BEFORE SURGERY and the MORNING OF SURGERY with CHG.   2. If you chose to wash your hair, wash your hair first as usual with your normal shampoo.  3. After you shampoo, rinse your hair and body thoroughly to remove the shampoo.  4. Use CHG as you would any other liquid soap. You can apply CHG directly to the skin and wash gently with a scrungie or a clean washcloth.   5. Apply the CHG Soap to your body ONLY FROM THE NECK DOWN.  Do not use on open wounds or open sores. Avoid contact with your eyes, ears, mouth and genitals (private parts). Wash Face and genitals (private parts)  with your normal soap.  6. Wash thoroughly, paying special attention to the area where your surgery will be performed.  7. Thoroughly rinse your body with warm water from the neck down.  8. DO NOT shower/wash with your normal soap after using and rinsing off the CHG Soap.  9. Pat yourself dry with a CLEAN TOWEL.  10. Wear CLEAN PAJAMAS to bed the night before surgery, wear comfortable clothes the morning of surgery  11. Place CLEAN SHEETS on your bed the night of your first shower and DO NOT SLEEP WITH PETS.  Day of Surgery:  Do not apply any deodorants/lotions.  Please wear clean clothes to the hospital/surgery center.   Remember to brush your teeth  WITH YOUR REGULAR TOOTHPASTE.   Do not wear jewelry, make-up or nail polish.  Do not wear lotions, powders, or perfumes, or deodorant.  Do not shave 48 hours prior to surgery.    Do not bring valuables to the hospital.  [facility] is not responsible for any belongings or valuables.  Contacts, dentures or bridgework may not be worn into surgery.  Leave your suitcase in the car.  After surgery it may be brought to your room.  For patients admitted to the hospital, discharge time will be determined by your treatment team.  Patients discharged the day of surgery will not be allowed to drive home.   Please read over the following fact sheets that you were given.

## 2018-04-21 ENCOUNTER — Other Ambulatory Visit: Payer: Self-pay

## 2018-04-21 ENCOUNTER — Ambulatory Visit: Payer: Medicare HMO | Admitting: Physical Therapy

## 2018-04-21 ENCOUNTER — Encounter (HOSPITAL_COMMUNITY): Payer: Self-pay

## 2018-04-21 ENCOUNTER — Encounter (HOSPITAL_COMMUNITY)
Admission: RE | Admit: 2018-04-21 | Discharge: 2018-04-21 | Disposition: A | Discharge status: Home / Self Care | Payer: Medicare HMO | Source: Ambulatory Visit | Attending: General Surgery | Admitting: General Surgery

## 2018-04-21 DIAGNOSIS — Z01818 Encounter for other preprocedural examination: Secondary | ICD-10-CM | POA: Insufficient documentation

## 2018-04-21 LAB — BASIC METABOLIC PANEL
Anion gap: 9 (ref 5–15)
BUN: 9 mg/dL (ref 8–23)
CO2: 30 mmol/L (ref 22–32)
Calcium: 9.2 mg/dL (ref 8.9–10.3)
Chloride: 100 mmol/L (ref 98–111)
Creatinine, Ser: 0.75 mg/dL (ref 0.44–1.00)
GFR calc Af Amer: >60 mL/min (ref >60)
GFR calc non Af Amer: >60 mL/min (ref >60)
Glucose, Bld: 203 mg/dL — ABNORMAL HIGH (ref 70–99)
Potassium: 3.9 mmol/L (ref 3.5–5.1)
Sodium: 139 mmol/L (ref 135–145)

## 2018-04-21 LAB — CBC
HCT: 38 % (ref 36.0–46.0)
Hemoglobin: 12.1 g/dL (ref 12.0–15.0)
MCH: 28.3 pg (ref 26.0–34.0)
MCHC: 31.8 g/dL (ref 30.0–36.0)
MCV: 88.8 fL (ref 80.0–100.0)
Platelets: 374 10*3/uL (ref 150–400)
RBC: 4.28 MIL/uL (ref 3.87–5.11)
RDW: 14.7 % (ref 11.5–15.5)
WBC: 10.1 10*3/uL (ref 4.0–10.5)
nRBC: 0 % (ref 0.0–0.2)

## 2018-04-21 LAB — HEMOGLOBIN A1C
Hgb A1c MFr Bld: 7.2 % — ABNORMAL HIGH (ref 4.8–5.6)
Mean Plasma Glucose: 159.94 mg/dL

## 2018-04-21 LAB — GLUCOSE, CAPILLARY: Glucose-Capillary: 186 mg/dL — ABNORMAL HIGH (ref 70–99)

## 2018-04-21 NOTE — Progress Notes (Signed)
PCP: [place] [facility]  DM: Type 2 Fasting Sugars: 120-135  SA: yes, does not wear CPAP  Pt denies SOB, cough, fever, chest pain  Pt stated understanding of instructions given for DOS.

## 2018-04-24 ENCOUNTER — Ambulatory Visit
Admission: RE | Admit: 2018-04-24 | Discharge: 2018-04-24 | Disposition: A | Discharge status: Home / Self Care | Payer: Medicare HMO | Source: Ambulatory Visit | Attending: General Surgery | Admitting: General Surgery

## 2018-04-24 DIAGNOSIS — C50912 Malignant neoplasm of unspecified site of left female breast: Secondary | ICD-10-CM

## 2018-04-24 MED ORDER — DEXTROSE 5 % IV SOLN
3.0000 g | INTRAVENOUS | Status: AC
  Administered 2018-04-25: 3 g via INTRAVENOUS
  Filled 2018-04-24: qty 3

## 2018-04-24 NOTE — H&P (Addendum)
History of Present Illness Marland Kitchen T. Saksham Akkerman MD; 04/13/2018 10:54 AM) The patient is a 68 year old female who presents with breast cancer. She is a postmenopausal female referred by Dr. Ammie Ferrier for evaluation of recently diagnosed carcinoma of the left breast. She recently presented for a screening mamogram revealing a possible left breast mass. Subsequent imaging included diagnostic mamogram showing a persistent spiculated mass in the upper outer quadrant measuring about 14 mm and ultrasound showing a hypoechoic irregular mass at the left breast 3 o'clock position 3 cm from the nipple measuring a maximum of 18 mm. An ultrasound guided breast biopsy was performed on March 31, 2018 with pathology revealing invasive ductal carcinoma of the breast. She is seen now in the office for initial treatment planning. She has experienced no breast symptoms such as lump or pain, skin changes or nipple discharge. She does not have a personal history of a benign mass removed from the left breast many years ago.  Findings at that time were the following: Tumor size: 1.8 cm Tumor grade: 1-2, Ki-67 15% Estrogen Receptor: 100% positive Progesterone Receptor: 80% positive Her-2 neu: Negative Lymph node status: Negative    Past Surgical History (Tanisha A. Owens Shark, [place]; 04/13/2018 10:04 AM) Breast Biopsy  Left. Cataract Surgery  Bilateral. Colon Polyp Removal - Colonoscopy  Gallbladder Surgery - Open  Hemorrhoidectomy  Hysterectomy (not due to cancer) - Complete  Knee Surgery  Bilateral. Shoulder Surgery  Bilateral. Spinal Surgery - Lower Back  Thyroid Surgery   Diagnostic Studies History (Tanisha A. Owens Shark, [place]; 04/13/2018 10:04 AM) Colonoscopy  1-5 years ago Pap Smear  >5 years ago  Allergies (Tanisha A. Owens Shark, [place]; 04/13/2018 10:04 AM) No Known Drug Allergies [04/13/2018]: Allergies Reconciled   Medication History (Tanisha A. Owens Shark, [place]; 04/13/2018 10:10 AM) Cetirizine HCl  (10MG Tablet, Oral) Active. Lisinopril (5MG Tablet, Oral) Active. Estradiol (0.5MG Tablet, Oral) Active. Furosemide (40MG Tablet, Oral) Active. Levothyroxine Sodium (50MCG Tablet, Oral) Active. Potassium Gluconate (595 (99 K)MG Tablet, Oral) Active. metFORMIN HCl (500MG Tablet, Oral) Active. Tamsulosin HCl (0.4MG Capsule, Oral) Active. Meloxicam (7.5MG Tablet, Oral) Active. Gabapentin (600MG Tablet, Oral) Active. NovoLIN R (100UNIT/ML Solution, Injection) Active. Allopurinol (300MG Tablet, Oral) Active. Glucosamine Chondroitin Complx (Oral) Active. Omeprazole (20MG Capsule DR, Oral) Active. lamoTRIgine (200MG Tablet, Oral) Active. raNITIdine 150 Max Strength (150MG Tablet, Oral) Active. Oxybutynin Chloride (10MG Tablet ER, Oral) Active. Medications Reconciled  Social History (Tanisha A. Owens Shark, [place]; 04/13/2018 10:04 AM) Alcohol use  Occasional alcohol use. Caffeine use  Carbonated beverages, Tea. No drug use  Tobacco use  Former smoker.  Family History (Tanisha A. Owens Shark, [place]; 04/13/2018 10:04 AM) Cancer  Mother. Diabetes Mellitus  Father. Heart Disease  Father. Heart disease in female family member before age 21   Pregnancy / Birth History (Tanisha A. Owens Shark, [place]; 04/13/2018 10:04 AM) Age at menarche  26 years. Age of menopause  <45 Irregular periods  Maternal age  49-20 Para  2  Other Problems (Tanisha A. Brown, [place]; 04/13/2018 10:04 AM) Anxiety Disorder  Arthritis  Cholelithiasis  Gastroesophageal Reflux Disease  Hemorrhoids  Hypercholesterolemia  Kidney Stone  Lump In Breast  Oophorectomy  Bilateral. Sleep Apnea  Thyroid Disease     Review of Systems (Tanisha A. Brown RMA; 04/13/2018 10:04 AM) General Present- Fatigue. Not Present- Appetite Loss, Chills, Fever, Night Sweats, Weight Gain and Weight Loss. HEENT Present- Earache, Hoarseness, Seasonal Allergies, Visual Disturbances and Wears glasses/contact lenses. Not Present- Hearing  Loss, Nose Bleed, Oral Ulcers, Ringing in the Ears, Sinus  Pain, Sore Throat and Yellow Eyes. Female Genitourinary Present- Urgency. Not Present- Frequency, Nocturia, Painful Urination and Pelvic Pain. Musculoskeletal Present- Back Pain, Joint Pain, Joint Stiffness and Swelling of Extremities. Not Present- Muscle Pain and Muscle Weakness. Neurological Present- Decreased Memory, Headaches, Numbness, Trouble walking and Weakness. Not Present- Fainting, Seizures, Tingling and Tremor. Psychiatric Present- Anxiety, Bipolar, Change in Sleep Pattern and Depression. Not Present- Fearful and Frequent crying. Endocrine Present- Hair Changes. Not Present- Cold Intolerance, Excessive Hunger, Heat Intolerance, Hot flashes and New Diabetes. Hematology Present- Easy Bruising and Excessive bleeding. Not Present- Blood Thinners, Gland problems, HIV and Persistent Infections.  Vitals (Tanisha A. Brown RMA; 04/13/2018 10:04 AM) 04/13/2018 10:04 AM Weight: 258.2 lb Height: 63in Body Surface Area: 2.16 m Body Mass Index: 45.74 kg/m  Temp.: 98.47F  Pulse: 88 (Regular)  BP: 122/82 (Sitting, Left Arm, Standard)       Physical Exam Marland Kitchen T. Phoua Hoadley MD; 04/13/2018 10:56 AM) The physical exam findings are as follows: Note:General: Alert, pleasant obese Caucasian female, in no distress Skin: Warm and dry without rash or infection. HEENT: No palpable masses or thyromegaly. Sclera nonicteric. Lymph nodes: No cervical, supraclavicular, nodes palpable. Breasts: Slight thickening outer left breast at biopsy site. Healed incision up her inner left breast. No other palpable abnormalities. No palpable axillary adenopathy. Lungs: Breath sounds clear and equal. No wheezing or increased work of breathing. Cardiovascular: Regular rate and rhythm without murmer. No JVD or edema. Abdomen: Nondistended. He along Coker incision Soft and nontender. No masses palpable. Extremities: No edema or joint swelling or  deformity. No chronic venous stasis changes. Moves about the exam room with some difficulty due to back discomfort and stiffness Neurologic: Alert and fully oriented. No focal weakness. Psychiatric: Normal mood and affect. Thought content appropriate with normal judgement and insight    Assessment & Plan Marland Kitchen T. Jaziel Bennett MD; 04/13/2018 11:01 AM) MALIGNANT NEOPLASM OF LEFT BREAST, STAGE 1, ESTROGEN RECEPTOR POSITIVE (C50.912) Impression: 68 year old female with a new diagnosis of cancer of the left breast, upper outer quadrant. Clinical stage 1B, ER positive, PR positive, HER-2 negative. I discussed with the patient and her husband present today initial surgical treatment options. We discussed options of breast conservation with lumpectomy or total mastectomy and sentinal lymph node biopsy/dissection. After discussion they have elected to proceed with breast conservation with lumpectomy and sentinel lymph node biopsy. We discussed the indications and nature of the procedure, and expected recovery, in detail. Surgical risks including anesthetic complications, cardiorespiratory complications, bleeding, infection, wound healing complications, blood clots, lymphedema, local and distant recurrence and possible need for further surgery based on the final pathology was discussed and understood. Chemotherapy, hormonal therapy and radiation therapy have been discussed. They have been provided with literature regarding the treatment of breast cancer. All questions were answered. They understand and agree to proceed and we will go ahead with scheduling. Current Plans Referred to Oncology, for evaluation and follow up (Oncology). Routine. Referred to Radiation Oncology, for evaluation and follow up (Radiation Oncology). Routine. Referred to Physical Therapy, for evaluation and follow up (Physical Therapy). Routine. Schedule for Surgery  Radioactive seed localized left breast lumpectomy and left axillary  sentinel lymph node biopsy under general anesthesia as an outpatient

## 2018-04-25 ENCOUNTER — Ambulatory Visit
Admission: RE | Admit: 2018-04-25 | Discharge: 2018-04-25 | Disposition: A | Discharge status: Home / Self Care | Payer: Medicare HMO | Source: Ambulatory Visit | Attending: General Surgery | Admitting: General Surgery

## 2018-04-25 ENCOUNTER — Ambulatory Visit (HOSPITAL_COMMUNITY)
Admission: RE | Admit: 2018-04-25 | Discharge: 2018-04-25 | Disposition: A | Discharge status: Home / Self Care | Payer: Medicare HMO | Attending: General Surgery | Admitting: General Surgery

## 2018-04-25 ENCOUNTER — Ambulatory Visit (HOSPITAL_COMMUNITY): Payer: Medicare HMO | Admitting: Physician Assistant

## 2018-04-25 ENCOUNTER — Encounter (HOSPITAL_COMMUNITY): Payer: Self-pay | Admitting: Anesthesiology

## 2018-04-25 ENCOUNTER — Encounter (HOSPITAL_COMMUNITY)
Admission: RE | Disposition: A | Discharge status: Home / Self Care | Payer: Self-pay | Source: Home / Self Care | Attending: General Surgery

## 2018-04-25 ENCOUNTER — Ambulatory Visit (HOSPITAL_COMMUNITY)
Admission: RE | Admit: 2018-04-25 | Discharge: 2018-04-25 | Disposition: A | Discharge status: Home / Self Care | Payer: Medicare HMO | Source: Ambulatory Visit | Attending: General Surgery | Admitting: General Surgery

## 2018-04-25 ENCOUNTER — Ambulatory Visit (HOSPITAL_COMMUNITY): Payer: Medicare HMO | Admitting: Certified Registered Nurse Anesthetist

## 2018-04-25 DIAGNOSIS — G4730 Sleep apnea, unspecified: Secondary | ICD-10-CM | POA: Insufficient documentation

## 2018-04-25 DIAGNOSIS — Z833 Family history of diabetes mellitus: Secondary | ICD-10-CM | POA: Insufficient documentation

## 2018-04-25 DIAGNOSIS — Z86010 Personal history of colonic polyps: Secondary | ICD-10-CM | POA: Insufficient documentation

## 2018-04-25 DIAGNOSIS — Z87891 Personal history of nicotine dependence: Secondary | ICD-10-CM | POA: Diagnosis not present

## 2018-04-25 DIAGNOSIS — Z8249 Family history of ischemic heart disease and other diseases of the circulatory system: Secondary | ICD-10-CM | POA: Diagnosis not present

## 2018-04-25 DIAGNOSIS — C50412 Malignant neoplasm of upper-outer quadrant of left female breast: Secondary | ICD-10-CM | POA: Diagnosis not present

## 2018-04-25 DIAGNOSIS — Z9841 Cataract extraction status, right eye: Secondary | ICD-10-CM | POA: Diagnosis not present

## 2018-04-25 DIAGNOSIS — C50912 Malignant neoplasm of unspecified site of left female breast: Secondary | ICD-10-CM

## 2018-04-25 DIAGNOSIS — Z7984 Long term (current) use of oral hypoglycemic drugs: Secondary | ICD-10-CM | POA: Diagnosis not present

## 2018-04-25 DIAGNOSIS — F419 Anxiety disorder, unspecified: Secondary | ICD-10-CM | POA: Diagnosis not present

## 2018-04-25 DIAGNOSIS — Z90710 Acquired absence of both cervix and uterus: Secondary | ICD-10-CM | POA: Diagnosis not present

## 2018-04-25 DIAGNOSIS — Z6841 Body Mass Index (BMI) 40.0 and over, adult: Secondary | ICD-10-CM | POA: Diagnosis not present

## 2018-04-25 DIAGNOSIS — E119 Type 2 diabetes mellitus without complications: Secondary | ICD-10-CM | POA: Insufficient documentation

## 2018-04-25 DIAGNOSIS — E669 Obesity, unspecified: Secondary | ICD-10-CM | POA: Insufficient documentation

## 2018-04-25 DIAGNOSIS — E7800 Pure hypercholesterolemia, unspecified: Secondary | ICD-10-CM | POA: Insufficient documentation

## 2018-04-25 DIAGNOSIS — Z809 Family history of malignant neoplasm, unspecified: Secondary | ICD-10-CM | POA: Diagnosis not present

## 2018-04-25 DIAGNOSIS — Z170 Estrogen receptor positive status [ER+]: Secondary | ICD-10-CM | POA: Insufficient documentation

## 2018-04-25 DIAGNOSIS — R51 Headache: Secondary | ICD-10-CM | POA: Diagnosis not present

## 2018-04-25 DIAGNOSIS — Z9842 Cataract extraction status, left eye: Secondary | ICD-10-CM | POA: Diagnosis not present

## 2018-04-25 DIAGNOSIS — F319 Bipolar disorder, unspecified: Secondary | ICD-10-CM | POA: Insufficient documentation

## 2018-04-25 DIAGNOSIS — Z9049 Acquired absence of other specified parts of digestive tract: Secondary | ICD-10-CM | POA: Insufficient documentation

## 2018-04-25 DIAGNOSIS — K219 Gastro-esophageal reflux disease without esophagitis: Secondary | ICD-10-CM | POA: Diagnosis not present

## 2018-04-25 DIAGNOSIS — Z87442 Personal history of urinary calculi: Secondary | ICD-10-CM | POA: Diagnosis not present

## 2018-04-25 DIAGNOSIS — M1990 Unspecified osteoarthritis, unspecified site: Secondary | ICD-10-CM | POA: Diagnosis not present

## 2018-04-25 HISTORY — PX: BREAST LUMPECTOMY WITH RADIOACTIVE SEED AND SENTINEL LYMPH NODE BIOPSY: SHX6550

## 2018-04-25 HISTORY — PX: BREAST LUMPECTOMY: SHX2

## 2018-04-25 LAB — GLUCOSE, CAPILLARY
Glucose-Capillary: 130 mg/dL — ABNORMAL HIGH (ref 70–99)
Glucose-Capillary: 109 mg/dL — ABNORMAL HIGH (ref 70–99)
Glucose-Capillary: 141 mg/dL — ABNORMAL HIGH (ref 70–99)

## 2018-04-25 SURGERY — BREAST LUMPECTOMY WITH RADIOACTIVE SEED AND SENTINEL LYMPH NODE BIOPSY
Anesthesia: General | Site: Breast | Laterality: Left

## 2018-04-25 MED ORDER — SODIUM CHLORIDE (PF) 0.9 % IJ SOLN
INTRAVENOUS | Status: DC | PRN
  Administered 2018-04-25: 10:00:00 via INTRAMUSCULAR

## 2018-04-25 MED ORDER — LIDOCAINE 2% (20 MG/ML) 5 ML SYRINGE
INTRAMUSCULAR | Status: AC
  Filled 2018-04-25: qty 5

## 2018-04-25 MED ORDER — FENTANYL CITRATE (PF) 100 MCG/2ML IJ SOLN
INTRAMUSCULAR | Status: AC
  Administered 2018-04-25: 50 ug
  Filled 2018-04-25: qty 2

## 2018-04-25 MED ORDER — PHENYLEPHRINE HCL 10 MG/ML IJ SOLN
INTRAMUSCULAR | Status: AC
  Filled 2018-04-25: qty 1

## 2018-04-25 MED ORDER — DEXAMETHASONE SODIUM PHOSPHATE 10 MG/ML IJ SOLN
INTRAMUSCULAR | Status: DC | PRN
  Administered 2018-04-25: 5 mg via INTRAVENOUS

## 2018-04-25 MED ORDER — CHLORHEXIDINE GLUCONATE CLOTH 2 % EX PADS: 6.0000 | MEDICATED_PAD | Freq: Once | CUTANEOUS | Status: DC

## 2018-04-25 MED ORDER — EPHEDRINE SULFATE-NACL 50-0.9 MG/10ML-% IV SOSY
PREFILLED_SYRINGE | INTRAVENOUS | Status: DC | PRN
  Administered 2018-04-25 (×3): 10 mg via INTRAVENOUS

## 2018-04-25 MED ORDER — MIDAZOLAM HCL 2 MG/2ML IJ SOLN
INTRAMUSCULAR | Status: AC
  Administered 2018-04-25: 2 mg
  Filled 2018-04-25: qty 2

## 2018-04-25 MED ORDER — OXYCODONE HCL 5 MG PO TABS
5.0000 mg | ORAL_TABLET | Freq: Four times a day (QID) | ORAL | Status: DC | PRN
  Administered 2018-04-25: 5 mg via ORAL

## 2018-04-25 MED ORDER — BUPIVACAINE-EPINEPHRINE 0.25% -1:200000 IJ SOLN
INTRAMUSCULAR | Status: DC | PRN
  Administered 2018-04-25: 30 mL

## 2018-04-25 MED ORDER — MIDAZOLAM HCL 2 MG/2ML IJ SOLN
INTRAMUSCULAR | Status: AC
  Filled 2018-04-25: qty 2

## 2018-04-25 MED ORDER — GLYCOPYRROLATE PF 0.2 MG/ML IJ SOSY
PREFILLED_SYRINGE | INTRAMUSCULAR | Status: AC
  Filled 2018-04-25: qty 2

## 2018-04-25 MED ORDER — 0.9 % SODIUM CHLORIDE (POUR BTL) OPTIME
TOPICAL | Status: DC | PRN
  Administered 2018-04-25: 1000 mL

## 2018-04-25 MED ORDER — ROCURONIUM BROMIDE 50 MG/5ML IV SOSY
PREFILLED_SYRINGE | INTRAVENOUS | Status: AC
  Filled 2018-04-25: qty 5

## 2018-04-25 MED ORDER — DEXAMETHASONE SODIUM PHOSPHATE 10 MG/ML IJ SOLN
INTRAMUSCULAR | Status: AC
  Filled 2018-04-25: qty 1

## 2018-04-25 MED ORDER — TECHNETIUM TC 99M SULFUR COLLOID FILTERED
1.0000 | Freq: Once | INTRAVENOUS | Status: AC | PRN
  Administered 2018-04-25: 1 via INTRADERMAL

## 2018-04-25 MED ORDER — METHYLENE BLUE 0.5 % INJ SOLN
INTRAVENOUS | Status: AC
  Filled 2018-04-25: qty 10

## 2018-04-25 MED ORDER — SCOPOLAMINE 1 MG/3DAYS TD PT72
MEDICATED_PATCH | TRANSDERMAL | Status: AC
  Filled 2018-04-25: qty 1

## 2018-04-25 MED ORDER — OXYCODONE HCL 5 MG PO TABS: 5.0000 mg | ORAL_TABLET | Freq: Four times a day (QID) | ORAL | 0 refills | Status: DC | PRN

## 2018-04-25 MED ORDER — GLYCOPYRROLATE PF 0.2 MG/ML IJ SOSY
PREFILLED_SYRINGE | INTRAMUSCULAR | Status: DC | PRN
  Administered 2018-04-25: .2 mg via INTRAVENOUS
  Administered 2018-04-25: .1 mg via INTRAVENOUS

## 2018-04-25 MED ORDER — FENTANYL CITRATE (PF) 250 MCG/5ML IJ SOLN
INTRAMUSCULAR | Status: DC | PRN
  Administered 2018-04-25: 100 ug via INTRAVENOUS
  Administered 2018-04-25: 50 ug via INTRAVENOUS

## 2018-04-25 MED ORDER — BUPIVACAINE-EPINEPHRINE (PF) 0.25% -1:200000 IJ SOLN
INTRAMUSCULAR | Status: AC
  Filled 2018-04-25: qty 30

## 2018-04-25 MED ORDER — PHENYLEPHRINE 40 MCG/ML (10ML) SYRINGE FOR IV PUSH (FOR BLOOD PRESSURE SUPPORT)
PREFILLED_SYRINGE | INTRAVENOUS | Status: DC | PRN
  Administered 2018-04-25 (×2): 80 ug via INTRAVENOUS

## 2018-04-25 MED ORDER — SUGAMMADEX SODIUM 200 MG/2ML IV SOLN
INTRAVENOUS | Status: DC | PRN
  Administered 2018-04-25: 400 mg via INTRAVENOUS

## 2018-04-25 MED ORDER — LIDOCAINE 2% (20 MG/ML) 5 ML SYRINGE
INTRAMUSCULAR | Status: DC | PRN
  Administered 2018-04-25: 40 mg via INTRAVENOUS

## 2018-04-25 MED ORDER — FENTANYL CITRATE (PF) 250 MCG/5ML IJ SOLN
INTRAMUSCULAR | Status: AC
  Filled 2018-04-25: qty 5

## 2018-04-25 MED ORDER — SCOPOLAMINE 1 MG/3DAYS TD PT72
MEDICATED_PATCH | TRANSDERMAL | Status: DC | PRN
  Administered 2018-04-25: 1 via TRANSDERMAL

## 2018-04-25 MED ORDER — ROCURONIUM BROMIDE 10 MG/ML (PF) SYRINGE
PREFILLED_SYRINGE | INTRAVENOUS | Status: DC | PRN
  Administered 2018-04-25: 40 mg via INTRAVENOUS

## 2018-04-25 MED ORDER — LACTATED RINGERS IV SOLN
INTRAVENOUS | Status: DC
  Administered 2018-04-25: 10:00:00 via INTRAVENOUS

## 2018-04-25 MED ORDER — PROPOFOL 10 MG/ML IV BOLUS
INTRAVENOUS | Status: AC
  Filled 2018-04-25: qty 20

## 2018-04-25 MED ORDER — PROPOFOL 10 MG/ML IV BOLUS
INTRAVENOUS | Status: DC | PRN
  Administered 2018-04-25: 140 mg via INTRAVENOUS

## 2018-04-25 MED ORDER — ONDANSETRON HCL 4 MG/2ML IJ SOLN
INTRAMUSCULAR | Status: DC | PRN
  Administered 2018-04-25: 4 mg via INTRAVENOUS

## 2018-04-25 MED ORDER — OXYCODONE HCL 5 MG PO TABS
ORAL_TABLET | ORAL | Status: AC
  Filled 2018-04-25: qty 1

## 2018-04-25 MED ORDER — ONDANSETRON HCL 4 MG/2ML IJ SOLN
INTRAMUSCULAR | Status: AC
  Filled 2018-04-25: qty 2

## 2018-04-25 MED ORDER — SODIUM CHLORIDE 0.9 % IV SOLN
INTRAVENOUS | Status: DC | PRN
  Administered 2018-04-25: 75 ug/min via INTRAVENOUS

## 2018-04-25 MED ORDER — SODIUM CHLORIDE (PF) 0.9 % IJ SOLN
INTRAMUSCULAR | Status: AC
  Filled 2018-04-25: qty 10

## 2018-04-25 MED ORDER — EPHEDRINE 5 MG/ML INJ
INTRAVENOUS | Status: AC
  Filled 2018-04-25: qty 10

## 2018-04-25 MED ORDER — ALBUMIN HUMAN 5 % IV SOLN
INTRAVENOUS | Status: DC | PRN
  Administered 2018-04-25: 10:00:00 via INTRAVENOUS

## 2018-04-25 MED ORDER — PHENYLEPHRINE 40 MCG/ML (10ML) SYRINGE FOR IV PUSH (FOR BLOOD PRESSURE SUPPORT)
PREFILLED_SYRINGE | INTRAVENOUS | Status: AC
  Filled 2018-04-25: qty 10

## 2018-04-25 SURGICAL SUPPLY — 57 items
APPLIER CLIP 9.375 MED OPEN (MISCELLANEOUS)
BINDER BREAST 3XL (GAUZE/BANDAGES/DRESSINGS) ×2 IMPLANT
BINDER BREAST LRG (GAUZE/BANDAGES/DRESSINGS) IMPLANT
BINDER BREAST XLRG (GAUZE/BANDAGES/DRESSINGS) IMPLANT
BLADE SURG 15 STRL LF DISP TIS (BLADE) IMPLANT
BLADE SURG 15 STRL SS (BLADE)
CANISTER SUCT 3000ML PPV (MISCELLANEOUS) IMPLANT
CHLORAPREP W/TINT 26ML (MISCELLANEOUS) ×2 IMPLANT
CLIP APPLIE 9.375 MED OPEN (MISCELLANEOUS) IMPLANT
CLIP VESOCCLUDE MED 6/CT (CLIP) ×4 IMPLANT
CONT SPEC 4OZ CLIKSEAL STRL BL (MISCELLANEOUS) ×2 IMPLANT
COVER PROBE W GEL 5X96 (DRAPES) ×2 IMPLANT
COVER SURGICAL LIGHT HANDLE (MISCELLANEOUS) ×2 IMPLANT
COVER WAND RF STERILE (DRAPES) IMPLANT
DERMABOND ADVANCED (GAUZE/BANDAGES/DRESSINGS) ×1
DERMABOND ADVANCED .7 DNX12 (GAUZE/BANDAGES/DRESSINGS) ×1 IMPLANT
DEVICE DUBIN SPECIMEN MAMMOGRA (MISCELLANEOUS) IMPLANT
DRAPE CHEST BREAST 15X10 FENES (DRAPES) ×2 IMPLANT
DRAPE INCISE IOBAN 66X45 STRL (DRAPES) IMPLANT
DRAPE UTILITY XL STRL (DRAPES) ×2 IMPLANT
DRSG PAD ABDOMINAL 8X10 ST (GAUZE/BANDAGES/DRESSINGS) ×2 IMPLANT
ELECT COATED BLADE 2.86 ST (ELECTRODE) ×2 IMPLANT
ELECT REM PT RETURN 9FT ADLT (ELECTROSURGICAL) ×2
ELECTRODE REM PT RTRN 9FT ADLT (ELECTROSURGICAL) ×1 IMPLANT
GAUZE SPONGE 4X4 12PLY STRL LF (GAUZE/BANDAGES/DRESSINGS) ×2 IMPLANT
GLOVE BIOGEL PI IND STRL 6.5 (GLOVE) ×1 IMPLANT
GLOVE BIOGEL PI IND STRL 8 (GLOVE) ×1 IMPLANT
GLOVE BIOGEL PI INDICATOR 6.5 (GLOVE) ×1
GLOVE BIOGEL PI INDICATOR 8 (GLOVE) ×1
GLOVE ECLIPSE 7.5 STRL STRAW (GLOVE) ×8 IMPLANT
GLOVE SURG SS PI 6.5 STRL IVOR (GLOVE) ×2 IMPLANT
GOWN STRL REUS W/ TWL LRG LVL3 (GOWN DISPOSABLE) ×1 IMPLANT
GOWN STRL REUS W/ TWL XL LVL3 (GOWN DISPOSABLE) ×2 IMPLANT
GOWN STRL REUS W/TWL LRG LVL3 (GOWN DISPOSABLE) ×1
GOWN STRL REUS W/TWL XL LVL3 (GOWN DISPOSABLE) ×2
ILLUMINATOR WAVEGUIDE N/F (MISCELLANEOUS) IMPLANT
KIT BASIN OR (CUSTOM PROCEDURE TRAY) ×2 IMPLANT
KIT MARKER MARGIN INK (KITS) ×2 IMPLANT
NDL SAFETY ECLIPSE 18X1.5 (NEEDLE) ×1 IMPLANT
NEEDLE FILTER BLUNT 18X 1/2SAF (NEEDLE) ×1
NEEDLE FILTER BLUNT 18X1 1/2 (NEEDLE) ×1 IMPLANT
NEEDLE HYPO 18GX1.5 SHARP (NEEDLE) ×1
NEEDLE HYPO 25GX1X1/2 BEV (NEEDLE) ×2 IMPLANT
NS IRRIG 1000ML POUR BTL (IV SOLUTION) ×2 IMPLANT
PACK GENERAL/GYN (CUSTOM PROCEDURE TRAY) ×2 IMPLANT
PACK SURGICAL SETUP 50X90 (CUSTOM PROCEDURE TRAY) IMPLANT
PAD ABD 8X10 STRL (GAUZE/BANDAGES/DRESSINGS) ×2 IMPLANT
PENCIL BUTTON HOLSTER BLD 10FT (ELECTRODE) IMPLANT
SPONGE LAP 18X18 X RAY DECT (DISPOSABLE) IMPLANT
SUT MON AB 5-0 PS2 18 (SUTURE) ×4 IMPLANT
SUT VIC AB 3-0 SH 18 (SUTURE) ×2 IMPLANT
SYR BULB 3OZ (MISCELLANEOUS) IMPLANT
SYR CONTROL 10ML LL (SYRINGE) ×2 IMPLANT
TOWEL OR 17X24 6PK STRL BLUE (TOWEL DISPOSABLE) ×2 IMPLANT
TOWEL OR 17X26 10 PK STRL BLUE (TOWEL DISPOSABLE) ×2 IMPLANT
TUBE CONNECTING 12X1/4 (SUCTIONS) IMPLANT
YANKAUER SUCT BULB TIP NO VENT (SUCTIONS) IMPLANT

## 2018-04-25 NOTE — Discharge Instructions (Signed)
[facility] °Office Phone Number [phone] ° °BREAST BIOPSY/ PARTIAL MASTECTOMY: POST OP INSTRUCTIONS ° °Always review your discharge instruction sheet given to you by the facility where your surgery was performed. ° °IF YOU HAVE DISABILITY OR FAMILY LEAVE FORMS, YOU MUST BRING THEM TO THE OFFICE FOR PROCESSING.  DO NOT GIVE THEM TO YOUR DOCTOR. ° °1. A prescription for pain medication may be given to you upon discharge.  Take your pain medication as prescribed, if needed.  If narcotic pain medicine is not needed, then you may take acetaminophen (Tylenol) or ibuprofen (Advil) as needed. °2. Take your usually prescribed medications unless otherwise directed °3. If you need a refill on your pain medication, please contact your pharmacy.  They will contact our office to request authorization.  Prescriptions will not be filled after 5pm or on week-ends. °4. You should eat very light the first 24 hours after surgery, such as soup, crackers, pudding, etc.  Resume your normal diet the day after surgery. °5. Most patients will experience some swelling and bruising in the breast.  Ice packs and a good support bra will help.  Swelling and bruising can take several days to resolve.  °6. It is common to experience some constipation if taking pain medication after surgery.  Increasing fluid intake and taking a stool softener will usually help or prevent this problem from occurring.  A mild laxative (Milk of Magnesia or Miralax) should be taken according to package directions if there are no bowel movements after 48 hours. °7. Unless discharge instructions indicate otherwise, you may remove your bandages 24-48 hours after surgery, and you may shower at that time.  You may have steri-strips (small skin tapes) in place directly over the incision.  These strips should be left on the skin for 7-10 days.  If your surgeon used skin glue on the incision, you may shower in 24 hours.  The glue will flake off over the  next 2-3 weeks.  Any sutures or staples will be removed at the office during your follow-up visit. °8. ACTIVITIES:  You may resume regular daily activities (gradually increasing) beginning the next day.  Wearing a good support bra or sports bra minimizes pain and swelling.  You may have sexual intercourse when it is comfortable. °a. You may drive when you no longer are taking prescription pain medication, you can comfortably wear a seatbelt, and you can safely maneuver your car and apply brakes. °b. RETURN TO WORK:  ______________________________________________________________________________________ °9. You should see your doctor in the office for a follow-up appointment approximately two weeks after your surgery.  Your doctor’s nurse will typically make your follow-up appointment when she calls you with your pathology report.  Expect your pathology report 2-3 business days after your surgery.  You may call to check if you do not hear from us after three days. °10. OTHER INSTRUCTIONS: _______________________________________________________________________________________________ _____________________________________________________________________________________________________________________________________ °_____________________________________________________________________________________________________________________________________ °_____________________________________________________________________________________________________________________________________ ° °WHEN TO CALL YOUR DOCTOR: °1. Fever over 101.0 °2. Nausea and/or vomiting. °3. Extreme swelling or bruising. °4. Continued bleeding from incision. °5. Increased pain, redness, or drainage from the incision. ° °The clinic staff is available to answer your questions during regular business hours.  Please don’t hesitate to call and ask to speak to one of the nurses for clinical concerns.  If you have a medical emergency, go to the nearest  emergency room or call 911.  A surgeon from [facility] is always on call at the hospital. ° °For further questions, please visit [email or web address]  °

## 2018-04-25 NOTE — Anesthesia Postprocedure Evaluation (Signed)
Anesthesia Post Note  Patient: Brittany Acosta  Procedure(s) Performed: LEFT BREAST LUMPECTOMY WITH RADIOACTIVE SEED AND SENTINEL LYMPH NODE BIOPSY (Left Breast)     Patient location during evaluation: PACU Anesthesia Type: General Level of consciousness: awake and alert Pain management: pain level controlled Vital Signs Assessment: post-procedure vital signs reviewed and stable Respiratory status: spontaneous breathing, nonlabored ventilation, respiratory function stable and patient connected to nasal cannula oxygen Cardiovascular status: blood pressure returned to baseline and stable Postop Assessment: no apparent nausea or vomiting Anesthetic complications: no    Last Vitals:  Vitals:   04/25/18 1152 04/25/18 1158  BP:  (!) 107/57  Pulse: 87   Resp: 20   Temp: (!) 36.1 C   SpO2: 99% 95%                 Effie Berkshire

## 2018-04-25 NOTE — Progress Notes (Signed)
Patient currently denies any chest pains, sob, elevated hr.

## 2018-04-25 NOTE — Transfer of Care (Signed)
Immediate Anesthesia Transfer of Care Note  Patient: Brittany Acosta  Procedure(s) Performed: LEFT BREAST LUMPECTOMY WITH RADIOACTIVE SEED AND SENTINEL LYMPH NODE BIOPSY (Left Breast)  Patient Location: PACU  Anesthesia Type:GA combined with regional for post-op pain  Level of Consciousness: awake, alert  and oriented  Airway & Oxygen Therapy: Patient Spontanous Breathing and Patient connected to nasal cannula oxygen  Post-op Assessment: Report given to RN and Post -op Vital signs reviewed and stable  Post vital signs: Reviewed and stable  Last Vitals:  Vitals Value Taken Time  BP 106/62 04/25/2018 11:28 AM  Temp 36.3 C 04/25/2018 11:28 AM  Pulse 96 04/25/2018 11:32 AM  Resp 16 04/25/2018 11:32 AM  SpO2 95 % 04/25/2018 11:32 AM  Vitals shown include unvalidated device data.  Last Pain:  Vitals:   04/25/18 0803  TempSrc: Oral         Complications: No apparent anesthesia complications

## 2018-04-25 NOTE — Anesthesia Procedure Notes (Signed)
Procedure Name: Intubation Date/Time: 04/25/2018 9:44 AM Performed by: Valda Favia, CRNA Pre-anesthesia Checklist: Patient identified, Emergency Drugs available, Suction available and Patient being monitored Patient Re-evaluated:Patient Re-evaluated prior to induction Oxygen Delivery Method: Circle System Utilized Preoxygenation: Pre-oxygenation with 100% oxygen Induction Type: IV induction Ventilation: Mask ventilation without difficulty Laryngoscope Size: Mac and 4 Grade View: Grade II Tube type: Oral Tube size: 7.0 mm Number of attempts: 1 Airway Equipment and Method: Stylet and Oral airway Placement Confirmation: ETT inserted through vocal cords under direct vision,  positive ETCO2 and breath sounds checked- equal and bilateral Secured at: 21 cm Tube secured with: Tape Dental Injury: Teeth and Oropharynx as per pre-operative assessment

## 2018-04-25 NOTE — Op Note (Signed)
Preoperative Diagnosis: LEFT BREAST CANCER  Postoprative Diagnosis: LEFT BREAST CANCER  Procedure: Procedure(s): BLUE DYE INJECTION LEFT BREAST, LEFT BREAST LUMPECTOMY WITH RADIOACTIVE SEED AND DEEP AXILLARY SENTINEL LYMPH NODE BIOPSY   Surgeon: Excell Seltzer T   Assistants: None  Anesthesia:  General endotracheal anesthesia  Indications: 68 year old female with a new diagnosis of cancer of the left breast, upper outer quadrant. Clinical stage 1B, ER positive, PR positive, HER-2 negative, 1.8 cm on imaging.  After extensive preoperative work-up and discussion detailed elsewhere we have elected to proceed with radioactive seed localized left breast lumpectomy and axillary sentinel lymph node biopsy as initial surgical therapy.    Procedure Detail: Patient had previously undergone accurate placement of a radioactive seed at the tumor and clip site in the upper outer left breast.  In the holding area she underwent a pectoral block by anesthesia and underwent injection of 1 mCi of technetium sulfur colloid intradermally around the left nipple.  She was taken to the operating room, placed in the supine position on the operating table, and general endotracheal anesthesia induced.  She received preoperative IV antibiotics.  Examination with the neoprobe in the operating room showed initially minimal counts in the axilla.  After patient timeout under sterile technique I injected 5 cc of dilute methylene blue subcutaneously around the left nipple and massage this for several minutes.  Following this the entire left breast and chest, axilla and upper arm were widely sterilely prepped and draped.  Patient timeout was again performed.  The lumpectomy was approached initially.  The seed was localized in the lateral breast.  I made a curvilinear incision in a skin fold overlying this and dissection carried down through the subcutaneous tissue with cautery.  Using the neoprobe for guidance I excised a  generous globular specimen of breast tissue around the area of high counts.  Specimen measured about 5 cm in diameter.  Specimen was inked for margins.  Specimen x-ray showed the seed and marking clip centrally located within the specimen.  Hemostasis was obtained to the lumpectomy cavity.  The cavity was marked with clips.  The deep breast and subcutaneous tissue was closed with interrupted 3-0 Vicryl. Attention was turned to the sentinel node.  At this point I was able to localize an area of increased counts using the neoprobe and a transverse incision in a skin crease in the axilla made and dissection carried down through the subcutaneous tissue with cautery.  The clavipectoral fascia was incised.  I was able to immediately feel a couple of somewhat firm enlarged lymph nodes which were excised with cautery.  Ex vivo these had minimally elevated counts.  A more posterior deep axillary node had significantly higher counts.  This was exposed again slightly enlarged and was excised with cautery.  Ex vivo this node had counts of about 300.  There was slight background in the axilla and one further adjacent node with minimally elevated counts was excised.  At this point I could not feel any palpable adenopathy and background counts in the axilla were minimal.  Hemostasis was assured.  The deep axillary and subcutaneous tissue was closed with interrupted 3-0 Vicryl.  Skin incisions were infiltrated with local anesthesia.  Both skin incisions were closed with subcuticular 5-0 Monocryl and Dermabond.  Sponge needle and instrument counts were correct.   Findings: As above  Estimated Blood Loss:  Minimal         Drains: None  Blood Given: none  Specimens: #1 left breast lumpectomy oriented    #2 left axillary sentinel lymph nodes X 4        Complications:  * No complications entered in OR log *         Disposition: PACU - hemodynamically stable.         Condition: stable

## 2018-04-25 NOTE — Anesthesia Procedure Notes (Addendum)
Anesthesia Regional Block: Pectoralis block   Pre-Anesthetic Checklist: ,, timeout performed, Correct Patient, Correct Site, Correct Laterality, Correct Procedure, Correct Position, site marked, Risks and benefits discussed,  Surgical consent,  Pre-op evaluation,  At surgeon's request and post-op pain management  Laterality: Left  Prep: chloraprep       Needles:  Injection technique: Single-shot  Needle Type: Echogenic Stimulator Needle     Needle Length: 9cm  Needle Gauge: 21     Additional Needles:   Procedures:,,,, ultrasound used (permanent image in chart),,,,  Narrative:  Start time: 04/25/2018 9:15 AM End time: 04/25/2018 9:20 AM Injection made incrementally with aspirations every 5 mL.  Performed by: Personally  Anesthesiologist: Effie Berkshire, MD  Additional Notes: Patient tolerated the procedure well. Local anesthetic introduced in an incremental fashion under minimal resistance after negative aspirations. No paresthesias were elicited. After completion of the procedure, no acute issues were identified and patient continued to be monitored by RN.

## 2018-04-25 NOTE — Anesthesia Preprocedure Evaluation (Addendum)
Anesthesia Evaluation  Patient identified by MRN, date of birth, ID band Patient awake    Reviewed: Allergy & Precautions, NPO status , Patient's Chart, lab work & pertinent test results  History of Anesthesia Complications (+) PONV  Airway Mallampati: I  TM Distance: >3 FB Neck ROM: Full    Dental  (+) Partial Upper, Dental Advisory Given   Pulmonary sleep apnea , former smoker,    breath sounds clear to auscultation       Cardiovascular hypertension, Pt. on medications + dysrhythmias  Rhythm:Regular Rate:Normal     Neuro/Psych  Headaches, Anxiety Depression Bipolar Disorder    GI/Hepatic Neg liver ROS, GERD  Medicated,  Endo/Other  diabetes, Type 2, Insulin Dependent, Oral Hypoglycemic AgentsHypothyroidism   Renal/GU      Musculoskeletal  (+) Arthritis , Osteoarthritis,    Abdominal (+) + obese,   Peds  Hematology   Anesthesia Other Findings   Reproductive/Obstetrics                            Lab Results  Component Value Date   WBC 10.1 04/21/2018   HGB 12.1 04/21/2018   HCT 38.0 04/21/2018   MCV 88.8 04/21/2018   PLT 374 04/21/2018   Lab Results  Component Value Date   CREATININE 0.75 04/21/2018   BUN 9 04/21/2018   NA 139 04/21/2018   K 3.9 04/21/2018   CL 100 04/21/2018   CO2 30 04/21/2018   Lab Results  Component Value Date   INR 1.01 02/04/2012   INR 2.21 (H) 06/21/2011   INR 1.47 06/20/2011   EKG: normal sinus rhythm.  Anesthesia Physical Anesthesia Plan  ASA: III  Anesthesia Plan: General   Post-op Pain Management:    Induction: Intravenous  PONV Risk Score and Plan: 4 or greater and Ondansetron, Dexamethasone, Midazolam and Scopolamine patch - Pre-op  Airway Management Planned: Oral ETT  Additional Equipment: None  Intra-op Plan:   Post-operative Plan: Extubation in OR  Informed Consent: I have reviewed the patients History and Physical, chart,  labs and discussed the procedure including the risks, benefits and alternatives for the proposed anesthesia with the patient or authorized representative who has indicated his/her understanding and acceptance.     Dental advisory given  Plan Discussed with: CRNA  Anesthesia Plan Comments:       Anesthesia Quick Evaluation

## 2018-04-25 NOTE — Interval H&P Note (Signed)
History and Physical Interval Note:  04/25/2018 9:15 AM  Brittany Acosta  has presented today for surgery, with the diagnosis of LEFT BREAST CANCER  The various methods of treatment have been discussed with the patient and family. After consideration of risks, benefits and other options for treatment, the patient has consented to  Procedure(s): LEFT BREAST LUMPECTOMY WITH RADIOACTIVE SEED AND SENTINEL LYMPH NODE BIOPSY (Left) as a surgical intervention .  The patient's history has been reviewed, patient examined, no change in status, stable for surgery.  I have reviewed the patient's chart and labs.  Questions were answered to the patient's satisfaction.     Darene Lamer Nickalas Mccarrick

## 2018-04-26 ENCOUNTER — Encounter (HOSPITAL_COMMUNITY): Payer: Self-pay | Admitting: General Surgery

## 2018-04-30 DIAGNOSIS — C50919 Malignant neoplasm of unspecified site of unspecified female breast: Secondary | ICD-10-CM | POA: Insufficient documentation

## 2018-05-10 ENCOUNTER — Encounter: Payer: Self-pay | Admitting: Adult Health

## 2018-05-10 DIAGNOSIS — C50412 Malignant neoplasm of upper-outer quadrant of left female breast: Secondary | ICD-10-CM | POA: Insufficient documentation

## 2018-05-10 DIAGNOSIS — Z170 Estrogen receptor positive status [ER+]: Secondary | ICD-10-CM | POA: Insufficient documentation

## 2018-05-10 NOTE — Progress Notes (Signed)
[facility] CONSULT NOTE  Patient Care Team: Beverlyn Roux, MD as PCP - General (Internal Medicine) Renato Shin, MD as Consulting Physician (Endocrinology) Frederik Pear, MD as Consulting Physician (Orthopedic Surgery) Juanita Craver, MD as Consulting Physician (Gastroenterology) Hennie Duos, MD as Consulting Physician (Rheumatology) Melida Quitter, MD as Consulting Physician (Otolaryngology) Brand Males, MD (Pulmonary Disease) Harriet Masson, DPM (Podiatry) Earlean Polka, MD (Ophthalmology) Norma Fredrickson, MD (Psychiatry) Tania Ade, MD (Orthopedic Surgery)  CHIEF COMPLAINTS/PURPOSE OF CONSULTATION: Newly diagnosed breast cancer s/p lumpectomy  HISTORY OF PRESENTING ILLNESS:  Brittany Acosta 68 y.o. female is here because of recent diagnosis of invasive ductal carcinoma of the left breast. The cancer was detected on a routine screening mammogram on 03/20/18. A diagnostic mammogram from 03/28/18 showed a 1.8cm mass in the left breast. A biopsy on 03/31/18 showed the cancer to be grade 1-2, ER 100%, PR 80%, HER2 negative, Ki67 15%. She had a left lumpectomy on 04/25/18 for which pathology showed the cancer to be grade 2 IDC with calcifications and no lymph node involvement.   She presents to the clinic today. She is recovering well from her surgery but reports some bleeding at the surgical site. She denies any family history of breast cancer. She lives in [place], [place] and is concerned about traveling here daily for radiation and would prefer to receive it at the cancer center in [place].  I reviewed her records extensively and collaborated the history with the patient.  SUMMARY OF ONCOLOGIC HISTORY:   Malignant neoplasm of upper-outer quadrant of left breast in female, estrogen receptor positive ([place])   04/09/2018 Initial Diagnosis    Screening mammogram detected left breast mass, by ultrasound measured 1.8 cm biopsy revealed grade 1-2 IDC ER 100%, PR 80%,  Ki-67 15%, HER-2 -1+ by IHC, T1CN0 stage Ia    04/25/2018 Surgery    Left lumpectomy: IDC with calcifications 1.7 cm, grade 2, 0/4 lymph nodes negative, ER 90%, PR 80%, HER-2 negative, Ki-67 15%, PT1CPN0 stage Ia    05/10/2018 Cancer Staging    Staging form: Breast, AJCC 8th Edition - Pathologic: Stage IA (pT1c, pN0, cM0, G2, ER+, PR+, HER2-) - Signed by Gardenia Phlegm, NP on 05/10/2018    MEDICAL HISTORY:  Past Medical History:  Diagnosis Date  . ALLERGIC RHINITIS   . Arthritis   . BIPOLAR DISORDER UNSPECIFIED    depression  . Cancer ([place])    skin cancer on left leg  . Chronic diastolic heart failure ([place])   . Depression   . FIBROMYALGIA   . GERD (gastroesophageal reflux disease)   . GOITER, MULTINODULAR   . Headache   . History of gout    "haven't had it in several years" (02/08/2012)  . History of kidney stones   . History of wrist fracture    rt wrist  . HYPERCHOLESTEROLEMIA   . Hypertension   . HYPOTHYROIDISM   . Iron deficiency anemia   . Kidney stones    sees urologist @ [facility]  . Morbid obesity ([place])   . Neuropathy due to secondary diabetes ([place])   . PONV (postoperative nausea and vomiting)   . PTSD (post-traumatic stress disorder)   . RBBB   . Sleep apnea 7/14   mild osa-did not need cpap -dr Gwenette Greet  . Type II diabetes mellitus ([place])   . Wears glasses     SURGICAL HISTORY: Past Surgical History:  Procedure Laterality Date  . ABDOMINAL HYSTERECTOMY  1976  . BREAST EXCISIONAL BIOPSY  Left   . BREAST LUMPECTOMY WITH RADIOACTIVE SEED AND SENTINEL LYMPH NODE BIOPSY Left 04/25/2018   Procedure: LEFT BREAST LUMPECTOMY WITH RADIOACTIVE SEED AND SENTINEL LYMPH NODE BIOPSY;  Surgeon: Excell Seltzer, MD;  Location: [facility];  Service: General;  Laterality: Left;  . BREAST SURGERY    . CARDIAC CATHETERIZATION  2001   sees Dr Peter Martinique  . CATARACT EXTRACTION  2014  . CATARACT EXTRACTION, BILATERAL  02/2011   epps  . CHOLECYSTECTOMY  1985  . EXCISIONAL  HEMORRHOIDECTOMY     "dr cut out in his office" (02/08/2012)  . INCISIONAL BREAST BIOPSY  2000   right  . JOINT REPLACEMENT     rt knee  . KNEE ARTHROSCOPY  08/2009   right  . KNEE ARTHROSCOPY WITH MEDIAL MENISECTOMY     left  . Left Cystoscopy   1990  . LEFT OOPHORECTOMY  1980  . Lithotripsy (L) Kidney  1997  . LUMBAR LAMINECTOMY/DECOMPRESSION MICRODISCECTOMY Left 07/01/2016   Procedure: Laminectomy for synovial cyst - left - Lumbar four-five;  Surgeon: Earnie Larsson, MD;  Location: [facility];  Service: Neurosurgery;  Laterality: Left;  . PARTIAL MASTECTOMY WITH NEEDLE LOCALIZATION Left 09/01/2012   Procedure: PARTIAL MASTECTOMY WITH NEEDLE LOCALIZATION;  Surgeon: Adin Hector, MD;  Location: [facility];  Service: General;  Laterality: Left;  . Percitania stone removed (L) Kidney  1992  . Right nasal surgery  08/1988  . Right sinus removed  08/1989   tooth partial  . ROTATOR CUFF REPAIR  2013   right shoulder x 2  . SHOULDER ARTHROSCOPY WITH ROTATOR CUFF REPAIR AND SUBACROMIAL DECOMPRESSION Left 08/20/2013   Procedure: SHOULDER ARTHROSCOPY  AND SUBACROMIAL DECOMPRESSION;  Surgeon: Nita Sells, MD;  Location: [facility];  Service: Orthopedics;  Laterality: Left;  Left shoulder arthroscopy, debridement, subacromial decompression, distal clavical resection  . THYROIDECTOMY  04/22/2011   Procedure: THYROIDECTOMY;  Surgeon: Onnie Graham, MD;  Location: [facility];  Service: ENT;  Laterality: N/A;  TOTAL THYROIDECOTMY  . TOTAL KNEE ARTHROPLASTY  06/18/2011   Procedure: TOTAL KNEE ARTHROPLASTY;  Surgeon: Kerin Salen, MD;  Location: [facility];  Service: Orthopedics;  Laterality: Left;  DEPUY  . TOTAL KNEE ARTHROPLASTY  02/07/2012   Procedure: TOTAL KNEE ARTHROPLASTY;  Surgeon: Kerin Salen, MD;  Location: [facility];  Service: Orthopedics;  Laterality: Right;  . TUBAL LIGATION  1972    SOCIAL HISTORY: Social History   Socioeconomic History  . Marital status: Married    Spouse name:  Not on file  . Number of children: 2  . Years of education: 76  . Highest education level: Not on file  Occupational History  . Occupation: disability    Employer: RETIRED  Social Needs  . Financial resource strain: Not on file  . Food insecurity:    Worry: Not on file    Inability: Not on file  . Transportation needs:    Medical: Not on file    Non-medical: Not on file  Tobacco Use  . Smoking status: Former Smoker    Packs/day: 1.00    Years: 6.00    Pack years: 6.00    Types: Cigarettes    Last attempt to quit: 04/06/1983    Years since quitting: 35.1  . Smokeless tobacco: Never Used  . Tobacco comment: Married, lives with spouse. Disable- 2 grown kids-6 g-kids  Substance and Sexual Activity  . Alcohol use: No    Comment: none  . Drug use: No  .  Sexual activity: Not Currently  Lifestyle  . Physical activity:    Days per week: Not on file    Minutes per session: Not on file  . Stress: Not on file  Relationships  . Social connections:    Talks on phone: Not on file    Gets together: Not on file    Attends religious service: Not on file    Active member of club or organization: Not on file    Attends meetings of clubs or organizations: Not on file    Relationship status: Not on file  . Intimate partner violence:    Fear of current or ex partner: Not on file    Emotionally abused: Not on file    Physically abused: Not on file    Forced sexual activity: Not on file  Other Topics Concern  . Not on file  Social History Narrative   Married, lives with spouse - 2 adult children   Disabled      No regular exercise    FAMILY HISTORY: Family History  Problem Relation Age of Onset  . Hypertension Mother   . Lung cancer Mother        non-smoker  . Diabetes Father   . Hypertension Father   . Hyperlipidemia Father   . Heart attack Other   . Coronary artery disease Other   . Hypertension Sister   . Hypertension Brother   . Hypertension Brother   . Hypertension  Brother   . Hypertension Sister   . Hypertension Sister   . Diabetes Daughter     ALLERGIES:  has No Known Allergies.  MEDICATIONS:  Current Outpatient Medications  Medication Sig Dispense Refill  . allopurinol (ZYLOPRIM) 300 MG tablet Take 300 mg by mouth daily.    . Biotin 10 MG CAPS Take 10 mg by mouth at bedtime.    . Blood Glucose Monitoring Suppl (ACCU-CHEK AVIVA PLUS) w/Device KIT USE TO CHECK BLOOD SUGAR 2 TIMES PER DAY. (Patient not taking: Reported on 04/14/2018) 1 kit 2  . cetirizine (ZYRTEC) 10 MG tablet Take 20 mg by mouth daily.     . Cholecalciferol (VITAMIN D3) 50 MCG (2000 UT) TABS Take 2,000 Units by mouth daily.    Marland Kitchen estradiol (ESTRACE) 0.5 MG tablet Take 1 tablet (0.5 mg total) by mouth daily. (Patient not taking: Reported on 04/14/2018) 90 tablet 3  . furosemide (LASIX) 40 MG tablet Take 1 tablet (40 mg total) by mouth daily. 90 tablet 3  . gabapentin (NEURONTIN) 600 MG tablet Take 600-900 mg by mouth See admin instructions. Take 600 mg by mouth three times daily and take 900 mg by mouth at bedtime    . GLUCOSAMINE-CHONDROITIN PO Take 1 tablet by mouth 2 (two) times daily.    Marland Kitchen glucose blood (ACCU-CHEK AVIVA PLUS) test strip USE TO CHECK BLOOD SUGAR TWO TIMES PER DAY. (Patient not taking: Reported on 04/14/2018) 200 each 3  . insulin NPH Human (HUMULIN N,NOVOLIN N) 100 UNIT/ML injection Inject 0.18 mLs (18 Units total) into the skin 2 (two) times daily. (Patient taking differently: Inject 20 Units into the skin 2 (two) times daily. ) 20 mL 11  . Insulin Syringe-Needle U-100 (INSULIN SYRINGE 1CC/31GX5/16") 31G X 5/16" 1 ML MISC Use to inject insulin two times per day. (Patient not taking: Reported on 04/14/2018) 200 each 2  . lamoTRIgine (LAMICTAL) 200 MG tablet Take 1 tablet (200 mg total) by mouth daily at 12 noon. 90 tablet 3  . Lancets (ACCU-CHEK MULTICLIX)  lancets Use to check blood sugar two time per day. (Patient not taking: Reported on 04/14/2018) 200 each 2  .  levothyroxine (SYNTHROID, LEVOTHROID) 50 MCG tablet Take 1 tablet (50 mcg total) by mouth daily. --- Office visit needed for further refills (Patient taking differently: Take 50 mcg by mouth daily before breakfast. ) 90 tablet 0  . lisinopril (PRINIVIL,ZESTRIL) 5 MG tablet Take 5 mg by mouth daily.    . Melatonin 10 MG CAPS Take 10 mg by mouth at bedtime.    . meloxicam (MOBIC) 7.5 MG tablet Take 7.5 mg by mouth daily.    . metFORMIN (GLUCOPHAGE) 500 MG tablet Take 500 mg by mouth 2 (two) times daily with a meal.    . omeprazole (PRILOSEC) 20 MG capsule Take 1 capsule (20 mg total) by mouth daily. Take 30 minutes before a meal (Patient taking differently: Take 20 mg by mouth daily at 12 noon. Take 30 minutes before a meal) 90 capsule 3  . oxybutynin (DITROPAN-XL) 10 MG 24 hr tablet Take 1 tablet (10 mg total) by mouth every evening. (Patient taking differently: Take 10 mg by mouth at bedtime. ) 90 tablet 3  . oxyCODONE (OXY IR/ROXICODONE) 5 MG immediate release tablet Take 1 tablet (5 mg total) by mouth every 6 (six) hours as needed for moderate pain, severe pain or breakthrough pain. 10 tablet 0  . potassium gluconate 595 (99 K) MG TABS tablet Take 595 mg by mouth daily.    . ranitidine (ZANTAC) 150 MG tablet Take 1 tablet (150 mg total) by mouth 2 (two) times daily. 180 tablet 3  . sertraline (ZOLOFT) 50 MG tablet Take 1 tablet (50 mg total) by mouth daily. 90 tablet 3  . tamsulosin (FLOMAX) 0.4 MG CAPS capsule Take 0.4 mg by mouth daily.     No current facility-administered medications for this visit.     REVIEW OF SYSTEMS:   Constitutional: Denies fevers, chills or abnormal night sweats Eyes: Denies blurriness of vision, double vision or watery eyes Ears, nose, mouth, throat, and face: Denies mucositis or sore throat Respiratory: Denies cough, dyspnea or wheezes Cardiovascular: Denies palpitation, chest discomfort or lower extremity swelling Gastrointestinal:  Denies nausea, heartburn or  change in bowel habits Skin: Denies abnormal skin rashes Lymphatics: Denies new lymphadenopathy or easy bruising Neurological:Denies numbness, tingling or new weaknesses Behavioral/Psych: Mood is stable, no new changes  Breast: Denies any palpable lumps or discharge (+) bleeding at surgical incision All other systems were reviewed with the patient and are negative.  PHYSICAL EXAMINATION: ECOG PERFORMANCE STATUS: 1 - Symptomatic but completely ambulatory  Vitals:   05/11/18 1236  BP: (!) 166/71  Pulse: 79  Resp: 17  Temp: 97.9 F (36.6 C)  SpO2: 97%   Filed Weights   05/11/18 1236  Weight: 251 lb 12.8 oz (114.2 kg)    GENERAL:alert, no distress and comfortable SKIN: skin color, texture, turgor are normal, no rashes or significant lesions EYES: normal, conjunctiva are pink and non-injected, sclera clear OROPHARYNX:no exudate, no erythema and lips, buccal mucosa, and tongue normal  NECK: supple, thyroid normal size, non-tender, without nodularity LYMPH:  no palpable lymphadenopathy in the cervical, axillary or inguinal LUNGS: clear to auscultation and percussion with normal breathing effort HEART: regular rate & rhythm and no murmurs and no lower extremity edema ABDOMEN:abdomen soft, non-tender and normal bowel sounds Musculoskeletal:no cyanosis of digits and no clubbing  PSYCH: alert & oriented x 3 with fluent speech NEURO: no focal motor/sensory deficits  LABORATORY  DATA:  I have reviewed the data as listed Lab Results  Component Value Date   WBC 10.1 04/21/2018   HGB 12.1 04/21/2018   HCT 38.0 04/21/2018   MCV 88.8 04/21/2018   PLT 374 04/21/2018   Lab Results  Component Value Date   NA 139 04/21/2018   K 3.9 04/21/2018   CL 100 04/21/2018   CO2 30 04/21/2018    RADIOGRAPHIC STUDIES: I have personally reviewed the radiological reports and agreed with the findings in the report.  ASSESSMENT AND PLAN:  Malignant neoplasm of upper-outer quadrant of left breast  in female, estrogen receptor positive ([place]) 04/25/2018 left lumpectomy: IDC with calcifications 1.7 cm, grade 2, 0/4 lymph nodes negative, ER 90%, PR 80%, HER-2 negative, Ki-67 15%, PT1CPN0 stage Ia  Pathology and radiology counseling:Discussed with the patient, the details of pathology including the type of breast cancer,the clinical staging, the significance of ER, PR and HER-2/neu receptors and the implications for treatment. After reviewing the pathology in detail, we proceeded to discuss the different treatment options between surgery, radiation, chemotherapy, antiestrogen therapies.  Recommendations: 1. Oncotype DX testing to determine if chemotherapy would be of any benefit followed by 2. Adjuvant radiation therapy (to be done in [place] ) followed by 3. Adjuvant antiestrogen therapy  Oncotype counseling: I discussed Oncotype DX test. I explained to the patient that this is a 21 gene panel to evaluate patient tumors DNA to calculate recurrence score. This would help determine whether patient has high risk or intermediate risk or low risk breast cancer. She understands that if her tumor was found to be high risk, she would benefit from systemic chemotherapy. If low risk, no need of chemotherapy. If she was found to be intermediate risk, we would need to evaluate the score as well as other risk factors and determine if an abbreviated chemotherapy may be of benefit.  Return to clinic Based upon Oncotype DX score    All questions were answered. The patient knows to call the clinic with any problems, questions or concerns.   Nicholas Lose, MD 05/11/2018   I, Molly Dorshimer, am acting as scribe for Nicholas Lose, MD.  I have reviewed the above documentation for accuracy and completeness, and I agree with the above.

## 2018-05-11 ENCOUNTER — Telehealth: Payer: Self-pay | Admitting: *Deleted

## 2018-05-11 ENCOUNTER — Encounter: Payer: Self-pay | Admitting: *Deleted

## 2018-05-11 ENCOUNTER — Inpatient Hospital Stay: Payer: Medicare HMO | Attending: Hematology and Oncology | Admitting: Hematology and Oncology

## 2018-05-11 DIAGNOSIS — Z7984 Long term (current) use of oral hypoglycemic drugs: Secondary | ICD-10-CM | POA: Diagnosis not present

## 2018-05-11 DIAGNOSIS — K219 Gastro-esophageal reflux disease without esophagitis: Secondary | ICD-10-CM | POA: Insufficient documentation

## 2018-05-11 DIAGNOSIS — C50412 Malignant neoplasm of upper-outer quadrant of left female breast: Secondary | ICD-10-CM | POA: Insufficient documentation

## 2018-05-11 DIAGNOSIS — Z170 Estrogen receptor positive status [ER+]: Secondary | ICD-10-CM | POA: Diagnosis not present

## 2018-05-11 DIAGNOSIS — Z794 Long term (current) use of insulin: Secondary | ICD-10-CM | POA: Insufficient documentation

## 2018-05-11 DIAGNOSIS — Z79899 Other long term (current) drug therapy: Secondary | ICD-10-CM | POA: Insufficient documentation

## 2018-05-11 DIAGNOSIS — E1140 Type 2 diabetes mellitus with diabetic neuropathy, unspecified: Secondary | ICD-10-CM | POA: Diagnosis not present

## 2018-05-11 DIAGNOSIS — Z8249 Family history of ischemic heart disease and other diseases of the circulatory system: Secondary | ICD-10-CM | POA: Insufficient documentation

## 2018-05-11 DIAGNOSIS — C50312 Malignant neoplasm of lower-inner quadrant of left female breast: Secondary | ICD-10-CM

## 2018-05-11 DIAGNOSIS — Z791 Long term (current) use of non-steroidal anti-inflammatories (NSAID): Secondary | ICD-10-CM | POA: Diagnosis not present

## 2018-05-11 DIAGNOSIS — Z87891 Personal history of nicotine dependence: Secondary | ICD-10-CM | POA: Diagnosis not present

## 2018-05-11 NOTE — Telephone Encounter (Signed)
Ordered Oncotype per Dr. Lindi Adie.  Faxed requisition to pathology and confirmed receipt.

## 2018-05-11 NOTE — Assessment & Plan Note (Addendum)
04/25/2018 left lumpectomy: IDC with calcifications 1.7 cm, grade 2, 0/4 lymph nodes negative, ER 90%, PR 80%, HER-2 negative, Ki-67 15%, PT1CPN0 stage Ia  Pathology and radiology counseling:Discussed with the patient, the details of pathology including the type of breast cancer,the clinical staging, the significance of ER, PR and HER-2/neu receptors and the implications for treatment. After reviewing the pathology in detail, we proceeded to discuss the different treatment options between surgery, radiation, chemotherapy, antiestrogen therapies.  Recommendations: 1. Oncotype DX testing to determine if chemotherapy would be of any benefit followed by 2. Adjuvant radiation therapy followed by 3. Adjuvant antiestrogen therapy  Oncotype counseling: I discussed Oncotype DX test. I explained to the patient that this is a 21 gene panel to evaluate patient tumors DNA to calculate recurrence score. This would help determine whether patient has high risk or intermediate risk or low risk breast cancer. She understands that if her tumor was found to be high risk, she would benefit from systemic chemotherapy. If low risk, no need of chemotherapy. If she was found to be intermediate risk, we would need to evaluate the score as well as other risk factors and determine if an abbreviated chemotherapy may be of benefit.  Return to clinic Based upon Oncotype DX score

## 2018-05-12 ENCOUNTER — Telehealth: Payer: Self-pay | Admitting: Hematology and Oncology

## 2018-05-12 NOTE — Telephone Encounter (Signed)
No los °

## 2018-05-18 ENCOUNTER — Ambulatory Visit: Payer: Medicare HMO | Admitting: Radiation Oncology

## 2018-05-18 ENCOUNTER — Ambulatory Visit: Payer: Medicare HMO | Attending: Radiation Oncology

## 2018-05-23 ENCOUNTER — Telehealth: Payer: Self-pay

## 2018-05-23 NOTE — Telephone Encounter (Signed)
Pt called to inquire about her oncotype results. Told pt that results are still pending and will be contacted, once they have been final. Pt verbalized understanding.

## 2018-05-24 ENCOUNTER — Telehealth: Payer: Self-pay | Admitting: *Deleted

## 2018-05-24 NOTE — Telephone Encounter (Signed)
Received oncotype score of 15/4%. Physician team notified. Called pt with results and discussed she does not need chemotherapy. Pt request referral to [facility] since she lives in [place]. Referral faxed to [phone].

## 2018-05-27 ENCOUNTER — Encounter (HOSPITAL_COMMUNITY): Payer: Self-pay | Admitting: Hematology and Oncology

## 2018-06-23 ENCOUNTER — Telehealth: Payer: Self-pay | Admitting: *Deleted

## 2018-06-23 ENCOUNTER — Telehealth: Payer: Self-pay | Admitting: Radiation Oncology

## 2018-06-23 NOTE — Telephone Encounter (Signed)
LM for pt regarding her concerns in prior telephone message and encouraged her to call us back if she had further questions.

## 2018-06-23 NOTE — Telephone Encounter (Signed)
Received a call from pt stating she was experiencing hot flashes.  Pt states that the hot flashes last all throughout the day, she has tried taking cold showers and sleeping under a fan at night with no relief.  Pt is currently on gabapentin with no relief from the hot flashes.  I educated the pt that she could be started on effexor to help with the hot flashes but with the pt currently being on zoloft, she can not be on the two at once.  Educated pt that she would need to follow up with her primary care provider to wean her off of the zoloft and to possibly try to start effexor to see if that relieves her symptoms of having hot flashes.  Pt states understanding.

## 2018-06-23 NOTE — Telephone Encounter (Signed)
Attempted to return pt call, no answer, LM on VM to call Dr. Geralyn Flash office back when she gets a chance.

## 2018-06-23 NOTE — Telephone Encounter (Signed)
-----   Message from Cori Razor, RN sent at 06/23/2018  8:38 AM EDT ----- Brittany Acosta called and wants to ask questions.  She is being treated in [place] and she is upset that they are treating her prone because it is so uncomfortable.  She wants to know if our equipment is more up to date and if it will be more comfortable.  If you could give her a call she would greatly appreciate it, [phone].  Thanks, Carolee Rota

## 2018-07-11 ENCOUNTER — Telehealth: Payer: Self-pay | Admitting: Hematology and Oncology

## 2018-07-11 NOTE — Telephone Encounter (Signed)
R/s apt per 4/7 sch message - pt aware of new appt date and time

## 2018-07-11 NOTE — Assessment & Plan Note (Deleted)
04/25/2018 left lumpectomy: IDC with calcifications 1.7 cm, grade 2, 0/4 lymph nodes negative, ER 90%, PR 80%, HER-2 negative, Ki-67 15%, PT1CPN0 stage Ia Oncotype DX recurrence score 15: Risk of distant recurrence 4% Adjuvant radiation therapy at [facility]  Treatment plan: Oral adjuvant antiestrogen therapy with anastrozole 1 mg daily Anastrozole counseling:We discussed the risks and benefits of anti-estrogen therapy with aromatase inhibitors. These include but not limited to insomnia, hot flashes, mood changes, vaginal dryness, bone density loss, and weight gain. We strongly believe that the benefits far outweigh the risks. Patient understands these risks and consented to starting treatment. Planned treatment duration is 5-7 years.  Return to clinic in 6 months for survivorship care plan visit

## 2018-07-13 ENCOUNTER — Ambulatory Visit: Payer: Medicare HMO | Admitting: Hematology and Oncology

## 2018-07-17 ENCOUNTER — Telehealth: Payer: Self-pay | Admitting: Hematology and Oncology

## 2018-07-17 NOTE — Telephone Encounter (Signed)
Called regarding upcoming Webex appointment, patient does not have access and would prefer it to be a telephone visit.

## 2018-07-20 NOTE — Progress Notes (Signed)
  HEMATOLOGY-ONCOLOGY TELEPHONE VISIT PROGRESS NOTE  I connected with Brittany Acosta on 07/21/2018 at 12:00 PM EDT by telephone and verified that I am speaking with the correct person using two identifiers.  I discussed the limitations, risks, security and privacy concerns of performing an evaluation and management service by telephone and the availability of in person appointments.  I also discussed with the patient that there may be a patient responsible charge related to this service. The patient expressed understanding and agreed to proceed.   History of Present Illness: Brittany Acosta is a 68 y.o. female with above-mentioned history of left breast cancer who underwent lumpectomy and radiation and presents today over the phone to discuss oral antiestrogen therapy.  She has significant radiation dermatitis and has noticed a new area of redness in the radiation field.  It does not appear to be getting better.    Malignant neoplasm of upper-outer quadrant of left breast in female, estrogen receptor positive ([place])   04/09/2018 Initial Diagnosis    Screening mammogram detected left breast mass, by ultrasound measured 1.8 cm biopsy revealed grade 1-2 IDC ER 100%, PR 80%, Ki-67 15%, HER-2 -1+ by IHC, T1CN0 stage Ia    04/18/2018 Oncotype testing    Oncotype DX score 15: Distant recurrence of 9 years: 4%    04/25/2018 Surgery    Left lumpectomy: IDC with calcifications 1.7 cm, grade 2, 0/4 lymph nodes negative, ER 90%, PR 80%, HER-2 negative, Ki-67 15%, PT1CPN0 stage Ia    05/10/2018 Cancer Staging    Staging form: Breast, AJCC 8th Edition - Pathologic: Stage IA (pT1c, pN0, cM0, G2, ER+, PR+, HER2-) - Signed by Gardenia Phlegm, NP on 05/10/2018    06/12/2018 - 07/11/2018 Radiation Therapy    Adjuvant radiation therapy at [facility]     Observations/Objective:  Radiation dermatitis   Assessment Plan:  Malignant neoplasm of upper-outer quadrant of left breast in female, estrogen receptor positive  ([place]) 04/25/2018 left lumpectomy: IDC with calcifications 1.7 cm, grade 2, 0/4 lymph nodes negative, ER 90%, PR 80%, HER-2 negative, Ki-67 15%, PT1CPN0 stage Ia Oncotype DX score 15: Distant recurrence of 9 years: 4% 06/12/2018-07/11/2018 Adjuvant radiation therapy at [facility] dermatitis: Instructed her to call the radiation oncologist and have them evaluate her skin.  Treatment plan: Adjuvant antiestrogen therapy with anastrozole 1 mg daily x7 years Anastrozole counseling:We discussed the risks and benefits of anti-estrogen therapy with aromatase inhibitors. These include but not limited to insomnia, hot flashes, mood changes, vaginal dryness, bone density loss, and weight gain. We strongly believe that the benefits far outweigh the risks. Patient understands these risks and consented to starting treatment. Planned treatment duration is 7 years.  Return to clinic in 4 months for survivorship care plan visit.  I discussed the assessment and treatment plan with the patient. The patient was provided an opportunity to ask questions and all were answered. The patient agreed with the plan and demonstrated an understanding of the instructions. The patient was advised to call back or seek an in-person evaluation if the symptoms worsen or if the condition fails to improve as anticipated.   I provided 18 minutes of non-face-to-face time during this encounter.   Rulon Eisenmenger, MD 07/21/2018     I, Molly Dorshimer, am acting as scribe for Nicholas Lose, MD.  I have reviewed the above documentation for accuracy and completeness, and I agree with the above.

## 2018-07-21 ENCOUNTER — Inpatient Hospital Stay: Payer: Medicare HMO | Attending: Hematology and Oncology | Admitting: Hematology and Oncology

## 2018-07-21 DIAGNOSIS — C50412 Malignant neoplasm of upper-outer quadrant of left female breast: Secondary | ICD-10-CM

## 2018-07-21 DIAGNOSIS — Z170 Estrogen receptor positive status [ER+]: Secondary | ICD-10-CM

## 2018-07-21 DIAGNOSIS — Z923 Personal history of irradiation: Secondary | ICD-10-CM

## 2018-07-21 MED ORDER — ANASTROZOLE 1 MG PO TABS: 1.0000 mg | ORAL_TABLET | Freq: Every day | ORAL | 3 refills | Status: DC

## 2018-07-21 NOTE — Assessment & Plan Note (Signed)
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 These include but not limited to insomnia, hot flashes, mood changes, vaginal dryness, bone density loss, and weight gain. We strongly believe that the benefits far outweigh the risks. Patient understands these risks and consented to starting treatment. Planned treatment duration is 7 years.  Return to clinic in 3 months for survivorship care plan visit.

## 2018-07-24 ENCOUNTER — Encounter: Payer: Self-pay | Admitting: *Deleted

## 2018-07-24 ENCOUNTER — Telehealth: Payer: Self-pay | Admitting: Hematology and Oncology

## 2018-07-24 NOTE — Telephone Encounter (Signed)
Called regarding 8/17

## 2018-08-24 ENCOUNTER — Other Ambulatory Visit: Payer: Self-pay | Admitting: Orthopedic Surgery

## 2018-08-24 DIAGNOSIS — M25531 Pain in right wrist: Secondary | ICD-10-CM

## 2018-08-25 ENCOUNTER — Other Ambulatory Visit: Payer: Self-pay

## 2018-08-25 ENCOUNTER — Ambulatory Visit
Admission: RE | Admit: 2018-08-25 | Discharge: 2018-08-25 | Disposition: A | Discharge status: Home / Self Care | Payer: Medicare HMO | Source: Ambulatory Visit | Attending: Orthopedic Surgery | Admitting: Orthopedic Surgery

## 2018-08-25 DIAGNOSIS — M25531 Pain in right wrist: Secondary | ICD-10-CM

## 2018-10-20 ENCOUNTER — Other Ambulatory Visit: Payer: Self-pay | Admitting: Radiology

## 2018-10-20 DIAGNOSIS — Z853 Personal history of malignant neoplasm of breast: Secondary | ICD-10-CM

## 2018-10-30 ENCOUNTER — Telehealth: Payer: Self-pay | Admitting: Adult Health

## 2018-10-30 NOTE — Telephone Encounter (Signed)
When I called patient to convert to my chart she said that she was going to call and cancel the appointment.

## 2018-11-02 ENCOUNTER — Telehealth: Payer: Self-pay | Admitting: *Deleted

## 2018-11-02 NOTE — Telephone Encounter (Signed)
Called pt, left vm regarding SCP visit. Requested return call if pt wishes to either come in for visit or if she would prefer care plan to be mail. Contact information provided.

## 2018-11-20 ENCOUNTER — Encounter: Payer: Medicare HMO | Admitting: Adult Health

## 2018-12-02 DIAGNOSIS — R2689 Other abnormalities of gait and mobility: Secondary | ICD-10-CM | POA: Insufficient documentation

## 2018-12-06 DIAGNOSIS — N393 Stress incontinence (female) (male): Secondary | ICD-10-CM | POA: Insufficient documentation

## 2018-12-08 ENCOUNTER — Other Ambulatory Visit: Payer: Self-pay | Admitting: Family Medicine

## 2018-12-08 DIAGNOSIS — E2839 Other primary ovarian failure: Secondary | ICD-10-CM

## 2018-12-25 DIAGNOSIS — R0989 Other specified symptoms and signs involving the circulatory and respiratory systems: Secondary | ICD-10-CM | POA: Insufficient documentation

## 2019-03-09 ENCOUNTER — Other Ambulatory Visit: Payer: Self-pay | Admitting: *Deleted

## 2019-03-09 MED ORDER — ANASTROZOLE 1 MG PO TABS: 1.0000 mg | ORAL_TABLET | Freq: Every day | ORAL | 0 refills | Status: DC

## 2019-03-12 ENCOUNTER — Other Ambulatory Visit: Payer: Self-pay | Admitting: *Deleted

## 2019-03-12 ENCOUNTER — Telehealth: Payer: Self-pay | Admitting: Hematology and Oncology

## 2019-03-12 MED ORDER — ANASTROZOLE 1 MG PO TABS: 1.0000 mg | ORAL_TABLET | Freq: Every day | ORAL | 1 refills | Status: DC

## 2019-03-12 NOTE — Telephone Encounter (Signed)
Scheduled appt per 12/04 sch message - pt is aware of appt added

## 2019-03-13 ENCOUNTER — Encounter: Payer: Self-pay | Admitting: *Deleted

## 2019-03-20 ENCOUNTER — Other Ambulatory Visit: Payer: Self-pay | Admitting: Adult Health

## 2019-03-20 DIAGNOSIS — Z853 Personal history of malignant neoplasm of breast: Secondary | ICD-10-CM

## 2019-03-22 ENCOUNTER — Ambulatory Visit
Admission: RE | Admit: 2019-03-22 | Discharge: 2019-03-22 | Disposition: A | Discharge status: Home / Self Care | Payer: Medicare HMO | Source: Ambulatory Visit | Attending: Family Medicine | Admitting: Family Medicine

## 2019-03-22 ENCOUNTER — Ambulatory Visit
Admission: RE | Admit: 2019-03-22 | Discharge: 2019-03-22 | Disposition: A | Discharge status: Home / Self Care | Payer: Medicare HMO | Source: Ambulatory Visit | Attending: Radiology | Admitting: Radiology

## 2019-03-22 ENCOUNTER — Other Ambulatory Visit: Payer: Self-pay

## 2019-03-22 DIAGNOSIS — Z853 Personal history of malignant neoplasm of breast: Secondary | ICD-10-CM

## 2019-03-22 DIAGNOSIS — E2839 Other primary ovarian failure: Secondary | ICD-10-CM

## 2019-07-10 ENCOUNTER — Ambulatory Visit: Payer: Medicare HMO | Admitting: Hematology and Oncology

## 2019-09-14 ENCOUNTER — Emergency Department (HOSPITAL_COMMUNITY): Payer: Medicare HMO

## 2019-09-14 ENCOUNTER — Encounter (HOSPITAL_COMMUNITY): Payer: Self-pay | Admitting: Emergency Medicine

## 2019-09-14 ENCOUNTER — Emergency Department (HOSPITAL_COMMUNITY)
Admission: EM | Admit: 2019-09-14 | Discharge: 2019-09-14 | Disposition: A | Discharge status: Home / Self Care | Payer: Medicare HMO | Attending: Emergency Medicine | Admitting: Emergency Medicine

## 2019-09-14 ENCOUNTER — Other Ambulatory Visit: Payer: Self-pay

## 2019-09-14 DIAGNOSIS — M79662 Pain in left lower leg: Secondary | ICD-10-CM

## 2019-09-14 DIAGNOSIS — I5032 Chronic diastolic (congestive) heart failure: Secondary | ICD-10-CM | POA: Insufficient documentation

## 2019-09-14 DIAGNOSIS — Z853 Personal history of malignant neoplasm of breast: Secondary | ICD-10-CM | POA: Diagnosis not present

## 2019-09-14 DIAGNOSIS — M79661 Pain in right lower leg: Secondary | ICD-10-CM | POA: Diagnosis not present

## 2019-09-14 DIAGNOSIS — Z96651 Presence of right artificial knee joint: Secondary | ICD-10-CM | POA: Insufficient documentation

## 2019-09-14 DIAGNOSIS — Z79899 Other long term (current) drug therapy: Secondary | ICD-10-CM | POA: Insufficient documentation

## 2019-09-14 DIAGNOSIS — Z9114 Patient's other noncompliance with medication regimen: Secondary | ICD-10-CM | POA: Insufficient documentation

## 2019-09-14 DIAGNOSIS — E039 Hypothyroidism, unspecified: Secondary | ICD-10-CM | POA: Diagnosis not present

## 2019-09-14 DIAGNOSIS — I110 Hypertensive heart disease with heart failure: Secondary | ICD-10-CM | POA: Insufficient documentation

## 2019-09-14 DIAGNOSIS — Z794 Long term (current) use of insulin: Secondary | ICD-10-CM | POA: Diagnosis not present

## 2019-09-14 DIAGNOSIS — Z87891 Personal history of nicotine dependence: Secondary | ICD-10-CM | POA: Insufficient documentation

## 2019-09-14 DIAGNOSIS — M7989 Other specified soft tissue disorders: Secondary | ICD-10-CM | POA: Insufficient documentation

## 2019-09-14 DIAGNOSIS — Z85828 Personal history of other malignant neoplasm of skin: Secondary | ICD-10-CM | POA: Insufficient documentation

## 2019-09-14 LAB — URINALYSIS, ROUTINE W REFLEX MICROSCOPIC
Bacteria, UA: NONE SEEN
Bilirubin Urine: NEGATIVE
Glucose, UA: NEGATIVE mg/dL
Hgb urine dipstick: NEGATIVE
Ketones, ur: NEGATIVE mg/dL
Nitrite: NEGATIVE
Protein, ur: NEGATIVE mg/dL
Specific Gravity, Urine: 1.006 (ref 1.005–1.030)
pH: 7 (ref 5.0–8.0)

## 2019-09-14 LAB — CBC WITH DIFFERENTIAL/PLATELET
Abs Immature Granulocytes: 0.06 10*3/uL (ref 0.00–0.07)
Basophils Absolute: 0 10*3/uL (ref 0.0–0.1)
Basophils Relative: 0 %
Eosinophils Absolute: 0.5 10*3/uL (ref 0.0–0.5)
Eosinophils Relative: 5 %
HCT: 34.9 % — ABNORMAL LOW (ref 36.0–46.0)
Hemoglobin: 10.8 g/dL — ABNORMAL LOW (ref 12.0–15.0)
Immature Granulocytes: 1 %
Lymphocytes Relative: 22 %
Lymphs Abs: 2.1 10*3/uL (ref 0.7–4.0)
MCH: 27.1 pg (ref 26.0–34.0)
MCHC: 30.9 g/dL (ref 30.0–36.0)
MCV: 87.7 fL (ref 80.0–100.0)
Monocytes Absolute: 0.5 10*3/uL (ref 0.1–1.0)
Monocytes Relative: 6 %
Neutro Abs: 6.5 10*3/uL (ref 1.7–7.7)
Neutrophils Relative %: 66 %
Platelets: 460 10*3/uL — ABNORMAL HIGH (ref 150–400)
RBC: 3.98 MIL/uL (ref 3.87–5.11)
RDW: 15.7 % — ABNORMAL HIGH (ref 11.5–15.5)
WBC: 9.7 10*3/uL (ref 4.0–10.5)
nRBC: 0 % (ref 0.0–0.2)

## 2019-09-14 LAB — COMPREHENSIVE METABOLIC PANEL
ALT: 18 U/L (ref 0–44)
AST: 23 U/L (ref 15–41)
Albumin: 3.4 g/dL — ABNORMAL LOW (ref 3.5–5.0)
Alkaline Phosphatase: 102 U/L (ref 38–126)
Anion gap: 9 (ref 5–15)
BUN: 10 mg/dL (ref 8–23)
CO2: 29 mmol/L (ref 22–32)
Calcium: 9.3 mg/dL (ref 8.9–10.3)
Chloride: 101 mmol/L (ref 98–111)
Creatinine, Ser: 0.84 mg/dL (ref 0.44–1.00)
GFR calc Af Amer: >60 mL/min (ref >60)
GFR calc non Af Amer: >60 mL/min (ref >60)
Glucose, Bld: 96 mg/dL (ref 70–99)
Potassium: 4.3 mmol/L (ref 3.5–5.1)
Sodium: 139 mmol/L (ref 135–145)
Total Bilirubin: 0.4 mg/dL (ref 0.3–1.2)
Total Protein: 6.6 g/dL (ref 6.5–8.1)

## 2019-09-14 MED ORDER — APIXABAN (ELIQUIS) EDUCATION KIT FOR DVT/PE PATIENTS
PACK | Freq: Once | Status: DC
  Filled 2019-09-14: qty 1

## 2019-09-14 MED ORDER — CEPHALEXIN 500 MG PO CAPS: 500.0000 mg | ORAL_CAPSULE | Freq: Four times a day (QID) | ORAL | 0 refills | Status: DC

## 2019-09-14 MED ORDER — CEPHALEXIN 250 MG PO CAPS
500.0000 mg | ORAL_CAPSULE | Freq: Once | ORAL | Status: AC
  Administered 2019-09-14: 500 mg via ORAL
  Filled 2019-09-14: qty 2

## 2019-09-14 MED ORDER — SODIUM CHLORIDE 0.9% FLUSH: 3.0000 mL | Freq: Once | INTRAVENOUS | Status: DC

## 2019-09-14 MED ORDER — APIXABAN 5 MG PO TABS
10.0000 mg | ORAL_TABLET | Freq: Once | ORAL | Status: AC
  Administered 2019-09-14: 10 mg via ORAL
  Filled 2019-09-14: qty 2

## 2019-09-14 MED ORDER — LISINOPRIL 10 MG PO TABS
10.0000 mg | ORAL_TABLET | Freq: Once | ORAL | Status: AC
  Administered 2019-09-14: 10 mg via ORAL
  Filled 2019-09-14: qty 1

## 2019-09-14 MED ORDER — HYDROCODONE-ACETAMINOPHEN 5-325 MG PO TABS
1.0000 | ORAL_TABLET | Freq: Once | ORAL | Status: AC
  Administered 2019-09-14: 1 via ORAL
  Filled 2019-09-14: qty 1

## 2019-09-14 NOTE — ED Notes (Signed)
Pt verbalized d/c instructions, follow up care and s/s requiring return to ed. Need to return to ED for vascular study discussed with pt. Pt had no additional questions and was transported to exit via wheelchair.

## 2019-09-14 NOTE — ED Triage Notes (Signed)
Patient arrives to ED with complaints of cellulitis in her right leg that was dx today at [facility]. Patient reports recent increased swelling and redness in the leg that worsened yesterday.

## 2019-09-14 NOTE — Discharge Instructions (Addendum)
Please take Keflex as prescribed 4 times daily.  You have gotten your first dose today and you may start taking in the morning.  You have received 1 dose of an anticoagulant today.  If your DVT study in the morning is negative you will not need to take any additional anticoagulants and will continue take the antibiotic as prescribed you have follow-up with your primary care doctor.  Tonight, if you have any sudden onset of chest pain or shortness of breath please return the ED for reevaluation.

## 2019-09-14 NOTE — ED Provider Notes (Signed)
[facility] EMERGENCY DEPARTMENT Provider Note   CSN: [id number] Arrival date & time: 09/14/19  1317     History Chief Complaint  Patient presents with  . Cellulitis  . Leg Swelling    Brittany Acosta is a 69 y.o. female history of DM 2, morbid obesity HPI  Patient is a 69 year old female with past medical history detailed below that is significant for bilateral lower extremity swelling but she takes Lasix for.  She denies any missed doses except for today.  She states that she has had leg that seems to been ongoing for approximately 1 week she fell on her right knee Monday and states that the pain in her right leg seem to get worse.  She has noticed some redness over the past few days as well and noticed some right-sided leg swelling and some tenderness in the calf.  She states that her foot seems somewhat puffy.  She denies any chest pain, shortness of breath, nausea, vomiting, fevers, lightheadedness, dizziness.  She denies any syncope.  Patient denies any history of DVT/PE.  She is on no blood thinners.  She states blood pressure medications regularly but did not take any today because she did not have a chance to.  She has been doubling up on her Lasix over the past week but she states that she sometimes misses doses of this.  Patient has remote history of breast cancer however states that she has not had any recurrence and denies any other known history of cancer.  She has no VTE history during her cancer treatment.    Past Medical History:  Diagnosis Date  . ALLERGIC RHINITIS   . Arthritis   . BIPOLAR DISORDER UNSPECIFIED    depression  . Cancer ([place])    skin cancer on left leg  . Chronic diastolic heart failure ([place])   . Depression   . FIBROMYALGIA   . GERD (gastroesophageal reflux disease)   . GOITER, MULTINODULAR   . Headache   . History of gout    "haven't had it in several years" (02/08/2012)  . History of kidney stones   . History of wrist fracture     rt wrist  . HYPERCHOLESTEROLEMIA   . Hypertension   . HYPOTHYROIDISM   . Iron deficiency anemia   . Kidney stones    sees urologist @ [place]  . Morbid obesity ([place])   . Neuropathy due to secondary diabetes ([place])   . PONV (postoperative nausea and vomiting)   . PTSD (post-traumatic stress disorder)   . RBBB   . Sleep apnea 7/14   mild osa-did not need cpap -dr Gwenette Greet  . Type II diabetes mellitus ([place])   . Wears glasses     Patient Active Problem List   Diagnosis Date Noted  . Malignant neoplasm of lower-inner quadrant of left breast in female, estrogen receptor positive ([place]) 05/11/2018  . Malignant neoplasm of upper-outer quadrant of left breast in female, estrogen receptor positive ([place]) 05/10/2018  . 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 Poorly controlled type 2 diabetes mellitus with circulatory disorder ([place]) 06/10/2015  . Family history of early CAD 04/18/2015  . Right bundle branch block 04/18/2015  . Bifascicular block 04/18/2015  . Tailbone injury 11/05/2014  . Lumbar radiculopathy 05/31/2014  . Greater trochanteric bursitis of left hip 04/22/2014  . Achilles tendinosis 02/19/2014  . Gait disorder 11/14/2013  . Recurrent falls 11/14/2013  . Multiple fractures 11/14/2013  . Pain in joint, ankle and  foot 07/02/2013  . Peripheral neuropathy 09/13/2012  . Migraine 07/10/2012  . Osteoarthritis of right knee 02/10/2012  . Osteoarthritis of left knee 06/21/2011  . GERD (gastroesophageal reflux disease) 02/09/2011  . Unspecified vitamin D deficiency 08/27/2010  . FIBROMYALGIA 03/23/2010  . GOITER, MULTINODULAR 03/24/2009  . Edema 09/06/2008  . Morbid obesity ([place]) 09/03/2008  . Hypothyroidism 06/10/2008  . HYPERCHOLESTEROLEMIA 06/10/2008  . BIPOLAR DISORDER UNSPECIFIED 06/10/2008  . Essential hypertension 06/10/2008  . ALLERGIC RHINITIS 06/10/2008  . Asymptomatic postmenopausal status 06/10/2008    Past Surgical History:  Procedure Laterality Date  .  ABDOMINAL HYSTERECTOMY  1976  . BREAST EXCISIONAL BIOPSY Left   . BREAST LUMPECTOMY WITH RADIOACTIVE SEED AND SENTINEL LYMPH NODE BIOPSY Left 04/25/2018   Procedure: LEFT BREAST LUMPECTOMY WITH RADIOACTIVE SEED AND SENTINEL LYMPH NODE BIOPSY;  Surgeon: Excell Seltzer, MD;  Location: [facility];  Service: General;  Laterality: Left;  . BREAST SURGERY    . CARDIAC CATHETERIZATION  2001   sees Dr Peter Martinique  . CATARACT EXTRACTION  2014  . CATARACT EXTRACTION, BILATERAL  02/2011   epps  . CHOLECYSTECTOMY  1985  . EXCISIONAL HEMORRHOIDECTOMY     "dr cut out in his office" (02/08/2012)  . INCISIONAL BREAST BIOPSY  2000   right  . JOINT REPLACEMENT     rt knee  . KNEE ARTHROSCOPY  08/2009   right  . KNEE ARTHROSCOPY WITH MEDIAL MENISECTOMY     left  . Left Cystoscopy   1990  . LEFT OOPHORECTOMY  1980  . Lithotripsy (L) Kidney  1997  . LUMBAR LAMINECTOMY/DECOMPRESSION MICRODISCECTOMY Left 07/01/2016   Procedure: Laminectomy for synovial cyst - left - Lumbar four-five;  Surgeon: Earnie Larsson, MD;  Location: [facility] [place];  Service: Neurosurgery;  Laterality: Left;  . PARTIAL MASTECTOMY WITH NEEDLE LOCALIZATION Left 09/01/2012   Procedure: PARTIAL MASTECTOMY WITH NEEDLE LOCALIZATION;  Surgeon: Adin Hector, MD;  Location: [place];  Service: General;  Laterality: Left;  . Percitania stone removed (L) Kidney  1992  . Right nasal surgery  08/1988  . Right sinus removed  08/1989   tooth partial  . ROTATOR CUFF REPAIR  2013   right shoulder x 2  . SHOULDER ARTHROSCOPY WITH ROTATOR CUFF REPAIR AND SUBACROMIAL DECOMPRESSION Left 08/20/2013   Procedure: SHOULDER ARTHROSCOPY  AND SUBACROMIAL DECOMPRESSION;  Surgeon: Nita Sells, MD;  Location: [place];  Service: Orthopedics;  Laterality: Left;  Left shoulder arthroscopy, debridement, subacromial decompression, distal clavical resection  . THYROIDECTOMY  04/22/2011   Procedure: THYROIDECTOMY;  Surgeon: Onnie Graham, MD;  Location: [facility];  Service: ENT;  Laterality: N/A;  TOTAL THYROIDECOTMY  . TOTAL KNEE ARTHROPLASTY  06/18/2011   Procedure: TOTAL KNEE ARTHROPLASTY;  Surgeon: Kerin Salen, MD;  Location: [facility];  Service: Orthopedics;  Laterality: Left;  DEPUY  . TOTAL KNEE ARTHROPLASTY  02/07/2012   Procedure: TOTAL KNEE ARTHROPLASTY;  Surgeon: Kerin Salen, MD;  Location: [place];  Service: Orthopedics;  Laterality: Right;  . TUBAL LIGATION  1972     OB History   No obstetric history on file.     Family History  Problem Relation Age of Onset  . Hypertension Mother   . Lung cancer Mother        non-smoker  . Diabetes Father   . Hypertension Father   . Hyperlipidemia Father   . Heart attack Other   . Coronary artery disease Other   . Hypertension Sister   .  Hypertension Brother   . Hypertension Brother   . Hypertension Brother   . Hypertension Sister   . Hypertension Sister   . Diabetes Daughter     Social History   Tobacco Use  . Smoking status: Former Smoker    Packs/day: 1.00    Years: 6.00    Pack years: 6.00    Types: Cigarettes    Quit date: 04/06/1983    Years since quitting: 36.4  . Smokeless tobacco: Never Used  . Tobacco comment: Married, lives with spouse. Disable- 2 grown kids-6 g-kids  Vaping Use  . Vaping Use: Never used  Substance Use Topics  . Alcohol use: No    Comment: none  . Drug use: No    Home Medications Prior to Admission medications   Medication Sig Start Date End Date Taking? Authorizing Provider  allopurinol (ZYLOPRIM) 300 MG tablet Take 300 mg by mouth daily.   Yes [provider]  anastrozole (ARIMIDEX) 1 MG tablet Take 1 tablet (1 mg total) by mouth daily. 03/12/19  Yes Nicholas Lose, MD  Biotin 10 MG CAPS Take 10 mg by mouth at bedtime.   Yes [provider]  Blood Glucose Monitoring Suppl (ACCU-CHEK AVIVA PLUS) w/Device KIT USE TO CHECK BLOOD SUGAR 2 TIMES PER DAY. Patient taking differently: 1 each by Other route See admin instructions. USE TO  CHECK BLOOD SUGAR 2 TIMES PER DAY. 04/25/17  Yes Philemon Kingdom, MD  cetirizine (ZYRTEC) 10 MG tablet Take 20 mg by mouth daily.    Yes [provider]  Cholecalciferol (VITAMIN D3) 50 MCG (2000 UT) TABS Take 2,000 Units by mouth daily.   Yes [provider]  furosemide (LASIX) 40 MG tablet Take 1 tablet (40 mg total) by mouth daily. 12/24/15  Yes Burns, Claudina Lick, MD  gabapentin (NEURONTIN) 600 MG tablet Take 600-900 mg by mouth See admin instructions. Take 600 mg by mouth three times daily and take 900 mg by mouth at bedtime   Yes [provider]  GLUCOSAMINE-CHONDROITIN PO Take 1 tablet by mouth 2 (two) times daily.   Yes [provider]  glucose blood (ACCU-CHEK AVIVA PLUS) test strip USE TO CHECK BLOOD SUGAR TWO TIMES PER DAY. Patient taking differently: 1 each by Other route See admin instructions. USE TO CHECK BLOOD SUGAR TWO TIMES PER DAY. 04/25/17  Yes Philemon Kingdom, MD  insulin aspart (NOVOLOG) 100 UNIT/ML injection Inject 30 Units into the skin in the morning and at bedtime.   Yes [provider]  Insulin Syringe-Needle U-100 (INSULIN SYRINGE 1CC/31GX5/16") 31G X 5/16" 1 ML MISC Use to inject insulin two times per day. Patient taking differently: 1 each by Other route See admin instructions. Use to inject insulin two times per day. 12/22/15  Yes Renato Shin, MD  Lancets (ACCU-CHEK MULTICLIX) lancets Use to check blood sugar two time per day. Patient taking differently: 1 each by Other route in the morning and at bedtime. Use to check blood sugar two time per day. 12/22/15  Yes Renato Shin, MD  levothyroxine (SYNTHROID, LEVOTHROID) 50 MCG tablet Take 1 tablet (50 mcg total) by mouth daily. --- Office visit needed for further refills Patient taking differently: Take 50 mcg by mouth daily before breakfast.  06/28/16  Yes Burns, Claudina Lick, MD  lisinopril (PRINIVIL,ZESTRIL) 5 MG tablet Take 5 mg by mouth daily.   Yes [provider]    Melatonin 10 MG CAPS Take 10 mg by mouth at bedtime.   Yes [provider]  meloxicam (MOBIC) 7.5 MG tablet Take 7.5 mg by mouth daily.   Yes [provider]  metFORMIN (GLUCOPHAGE) 500 MG tablet Take 500 mg by mouth 2 (two) times daily with a meal.   Yes [provider]  omeprazole (PRILOSEC) 20 MG capsule Take 1 capsule (20 mg total) by mouth daily. Take 30 minutes before a meal Patient taking differently: Take 20 mg by mouth daily at 12 noon. Take 30 minutes before a meal 12/24/15  Yes Burns, Claudina Lick, MD  oxybutynin (DITROPAN-XL) 10 MG 24 hr tablet Take 1 tablet (10 mg total) by mouth every evening. Patient taking differently: Take 10 mg by mouth at bedtime.  12/24/15  Yes Burns, Claudina Lick, MD  potassium gluconate 595 (99 K) MG TABS tablet Take 595 mg by mouth daily.   Yes [provider]  ranitidine (ZANTAC) 150 MG tablet Take 1 tablet (150 mg total) by mouth 2 (two) times daily. 01/06/16  Yes Burns, Claudina Lick, MD  sertraline (ZOLOFT) 50 MG tablet Take 1 tablet (50 mg total) by mouth daily. 02/19/16  Yes Burns, Claudina Lick, MD  tamsulosin (FLOMAX) 0.4 MG CAPS capsule Take 0.4 mg by mouth daily.   Yes [provider]  cephALEXin (KEFLEX) 500 MG capsule Take 1 capsule (500 mg total) by mouth 4 (four) times daily. 09/14/19   Tedd Sias, PA  estradiol (ESTRACE) 0.5 MG tablet Take 1 tablet (0.5 mg total) by mouth daily. Patient not taking: Reported on 04/14/2018 01/06/16   [name], MD  insulin NPH Human (HUMULIN N,NOVOLIN N) 100 UNIT/ML injection Inject 0.18 mLs (18 Units total) into the skin 2 (two) times daily. Patient not taking: Reported on 09/14/2019 01/20/17   Philemon Kingdom, MD  lamoTRIgine (LAMICTAL) 200 MG tablet Take 1 tablet (200 mg total) by mouth daily at 12 noon. 01/06/16   [name], MD  oxyCODONE (OXY IR/ROXICODONE) 5 MG immediate release tablet Take 1 tablet (5 mg total) by mouth every 6 (six) hours as needed for moderate pain,  severe pain or breakthrough pain. 04/25/18   Excell Seltzer, MD    Allergies    Patient has no known allergies.  Review of Systems   Review of Systems  Constitutional: Negative for chills and fever.  HENT: Negative for congestion.   Eyes: Negative for pain.  Respiratory: Negative for cough and shortness of breath.   Cardiovascular: Negative for chest pain and leg swelling.  Gastrointestinal: Negative for abdominal pain and vomiting.  Genitourinary: Negative for dysuria.  Musculoskeletal: Negative for myalgias.       Right calf tenderness  Skin: Negative for wound.       Redness, swelling and discomfort of the right leg  Neurological: Negative for dizziness and headaches.    Physical Exam Updated Vital Signs BP (!) 173/91 (BP Location: Right Arm)   Pulse 74   Temp 97.8 F (36.6 C) (Oral)   Resp 20   Ht '5\' 3"'  (1.6 m)   Wt 115.7 kg   SpO2 100%   BMI 45.17 kg/m   Physical Exam Vitals and nursing note reviewed.  Constitutional:      General: She is not in acute distress. HENT:     Head: Normocephalic and atraumatic.     Nose: Nose normal.     Mouth/Throat:     Mouth: Mucous membranes are moist.  Eyes:     General: No scleral icterus. Cardiovascular:     Rate and Rhythm: Normal rate and regular rhythm.  Pulses: Normal pulses.     Heart sounds: Normal heart sounds.     Comments: DP and PT pulses 3+ and symmetric Pulmonary:     Effort: Pulmonary effort is normal. No respiratory distress.     Breath sounds: No wheezing.  Abdominal:     Palpations: Abdomen is soft.     Tenderness: There is no abdominal tenderness.  Musculoskeletal:     Cervical back: Normal range of motion.     Right lower leg: Edema present.     Left lower leg: No edema.     Comments: 2+ right-sided lower extremity edema to the level of the knee Full range of motion of both legs. Strength 5/5 foot dorsi and plantar flexion   Skin:    General: Skin is warm and dry.     Capillary Refill:  Capillary refill takes less than 2 seconds.     Comments: Mild circumferential erythema to the ankle and anterior shin.  There is no sharp demarcation and no lymphangitis  Neurological:     Mental Status: She is alert. Mental status is at baseline.     Comments: Sensation grossly intact bilateral lower extremities  Psychiatric:        Mood and Affect: Mood normal.        Behavior: Behavior normal.     ED Results / Procedures / Treatments   Labs (all labs ordered are listed, but only abnormal results are displayed) Labs Reviewed  COMPREHENSIVE METABOLIC PANEL - Abnormal; Notable for the following components:      Result Value   Albumin 3.4 (*)    All other components within normal limits  CBC WITH DIFFERENTIAL/PLATELET - Abnormal; Notable for the following components:   Hemoglobin 10.8 (*)    HCT 34.9 (*)    RDW 15.7 (*)    Platelets 460 (*)    All other components within normal limits  URINALYSIS, ROUTINE W REFLEX MICROSCOPIC - Abnormal; Notable for the following components:   Color, Urine STRAW (*)    Leukocytes,Ua TRACE (*)    All other components within normal limits    EKG None  Radiology DG Knee 2 Views Right  Result Date: 09/14/2019 CLINICAL DATA:  Fall, right knee pain EXAM: RIGHT KNEE - 1-2 VIEW COMPARISON:  None. FINDINGS: Changes of right knee replacement. No hardware bony complicating feature. No acute bony abnormality. Specifically, no fracture, subluxation, or dislocation. IMPRESSION: Right knee replacement.  No acute bony abnormality. Electronically Signed   By: Rolm Baptise M.D.   On: 09/14/2019 21:22    Procedures Procedures (including critical care time)  Medications Ordered in ED Medications  sodium chloride flush (NS) 0.9 % injection 3 mL (has no administration in time range)  apixaban (ELIQUIS) Education Kit for DVT/PE patients (has no administration in time range)  lisinopril (ZESTRIL) tablet 10 mg (10 mg Oral Given 09/14/19 2155)  apixaban  (ELIQUIS) tablet 10 mg (10 mg Oral Given 09/14/19 2208)  cephALEXin (KEFLEX) capsule 500 mg (500 mg Oral Given 09/14/19 2155)  HYDROcodone-acetaminophen (NORCO/VICODIN) 5-325 MG per tablet 1 tablet (1 tablet Oral Given 09/14/19 2156)    ED Course  I have reviewed the triage vital signs and the nursing notes.  Pertinent labs & imaging results that were available during my care of the patient were reviewed by me and considered in my medical decision making (see chart for details).  Patient with right lower extremity pain and swelling.  It is unilateral she has good pulses a low suspicion  for arterial injury.  More suspicious for DVT versus cellulitis.  Fracture but given the trauma that she indicated I will obtain x-ray.  Clinical Course as of Sep 14 2335  Fri Sep 14, 2019  2131 I personally reviewed the plain film of the right knee without any acute abnormality.  S/p knee replacement.  No bony fracture or abnormality.  Agree with radiology read.   [WF]  2131 CBC without leukocytosis.  Stable anemia she is having no symptoms of chest pain, shortness of breath, lightheadedness or dizziness.  She will follow-up with her PCP for this.   [WF]  2132 Urinalysis with trace leukocytes straw urine color.  She has no urinary symptoms.  Urinalysis, Routine w reflex microscopic(!) [WF]  2132 CMP without any electrolyte abnormalities.  No acute irregularities.   [WF]    Clinical Course User Index [WF] Tedd Sias, [place]   MDM Rules/Calculators/A&P                          I discussed this case with my attending physician who cosigned this note including patient's presenting symptoms, physical exam, and planned diagnostics and interventions. Attending physician stated agreement with plan or made changes to plan which were implemented.   Concern for DVT versus cellulitis.  Will treat empirically for both at this time.  Patient scheduled for DVT ultrasonography study done in the morning.  Patient  understanding of plan.   I discussed with pharmacist at bedside.  Plan is understood by patient and pharmacy and my attending physician.  Patient discharged at this time with close follow-up with the radiology department for Doppler ultrasound to evaluate for DVT tomorrow morning.  She was given 1 dose of DOAC tonight as well as Keflex.  She was prescribed full course of Keflex however she would discontinue this and take anticoagulants at the discretion of ER provider tomorrow if her DVT study is positive.  Final Clinical Impression(s) / ED Diagnoses Final diagnoses:  Pain of left calf  Left leg swelling    Rx / DC Orders ED Discharge Orders         Ordered    LE VENOUS        09/14/19 2055    cephALEXin (KEFLEX) 500 MG capsule  4 times daily     Discontinue  Reprint     09/14/19 2151           Tedd Sias, [place] 09/14/19 2337    Blanchie Dessert, MD 09/15/19 1533

## 2019-09-15 ENCOUNTER — Ambulatory Visit (HOSPITAL_COMMUNITY)
Admission: RE | Admit: 2019-09-15 | Discharge: 2019-09-15 | Disposition: A | Discharge status: Home / Self Care | Payer: Medicare HMO | Source: Ambulatory Visit | Attending: Internal Medicine | Admitting: Internal Medicine

## 2019-09-15 DIAGNOSIS — M79661 Pain in right lower leg: Secondary | ICD-10-CM | POA: Diagnosis not present

## 2019-09-15 DIAGNOSIS — R2241 Localized swelling, mass and lump, right lower limb: Secondary | ICD-10-CM | POA: Diagnosis not present

## 2019-09-15 DIAGNOSIS — M7989 Other specified soft tissue disorders: Secondary | ICD-10-CM | POA: Insufficient documentation

## 2019-09-15 NOTE — Progress Notes (Signed)
VASCULAR LAB PRELIMINARY  PRELIMINARY  PRELIMINARY  PRELIMINARY  Lower extremity venous duplex completed.    Preliminary report:  See CV proc for preliminary results.  Jessy Cybulski, RVT 09/15/2019, 11:30 AM

## 2019-10-01 LAB — COLOGUARD: Cologuard: NEGATIVE

## 2019-10-03 ENCOUNTER — Other Ambulatory Visit: Payer: Self-pay | Admitting: Family Medicine

## 2019-10-03 DIAGNOSIS — M25561 Pain in right knee: Secondary | ICD-10-CM

## 2019-10-04 ENCOUNTER — Ambulatory Visit
Admission: RE | Admit: 2019-10-04 | Discharge: 2019-10-04 | Disposition: A | Discharge status: Home / Self Care | Payer: Medicare HMO | Source: Ambulatory Visit | Attending: Family Medicine | Admitting: Family Medicine

## 2019-10-04 DIAGNOSIS — M25561 Pain in right knee: Secondary | ICD-10-CM

## 2019-10-04 LAB — COLOGUARD: COLOGUARD: NEGATIVE

## 2019-12-28 ENCOUNTER — Other Ambulatory Visit: Payer: Self-pay

## 2019-12-28 ENCOUNTER — Ambulatory Visit (INDEPENDENT_AMBULATORY_CARE_PROVIDER_SITE_OTHER): Payer: Medicare HMO | Admitting: Physician Assistant

## 2019-12-28 ENCOUNTER — Encounter: Payer: Self-pay | Admitting: Physician Assistant

## 2019-12-28 VITALS — BP 118/58 | HR 80 | Temp 97.9°F | Ht 63.0 in | Wt 252.2 lb

## 2019-12-28 DIAGNOSIS — R6884 Jaw pain: Secondary | ICD-10-CM

## 2019-12-28 DIAGNOSIS — Z23 Encounter for immunization: Secondary | ICD-10-CM | POA: Diagnosis not present

## 2019-12-28 DIAGNOSIS — M25469 Effusion, unspecified knee: Secondary | ICD-10-CM | POA: Diagnosis not present

## 2019-12-28 DIAGNOSIS — M7989 Other specified soft tissue disorders: Secondary | ICD-10-CM | POA: Diagnosis not present

## 2019-12-28 NOTE — Patient Instructions (Signed)
It was great to see you!  Let's refer you to cardiology for further evaluation and management.  Let's follow-up in 1-2 months regarding your diabetes.  Take care,  Inda Coke PA-C

## 2019-12-28 NOTE — Progress Notes (Signed)
Brittany Acosta is a 69 y.o. female is here to establish care.  I acted as a Education administrator for [employer], PA-C Anselmo Pickler, LPN   History of Present Illness:   Chief Complaint  Patient presents with  . Establish Care  . Leg Swelling  . Jaw Pain    HPI    Scleroderma She was recently told that she has scleroderma. Was recommended to start MTX. She is undecided if she wants to take this. She is seeing Dr. Kathlene November to manage her autoimmune issues. She was told at one point that she had RA, but states that was later ruled out.  Leg swelling Pt c/o bilateral leg swelling x 6 months R>L. Had a fall in June and fell on bilateral knees. She was sent to ER for evaluation of blood clot, had negative DVT. She ended up getting a CT of her knee on 10/04/19. This showed: IMPRESSION: 1. Severe beam hardening artifact resulting from the arthroplasty significantly limiting evaluation of the soft tissue and osseous structures. 2. Right total knee arthroplasty without hardware failure or complication. 3. Small complex joint effusion likely reflecting a hemarthrosis. She has recently to PT for 3-4 weeks.   She also endorses worsening lower leg swelling. She is on 40 mg lasix. Has been on this for quite a long time. She states that she has overall normal urine output, is not sure if she is responding to her lasix.  Has significant family hx cardiac issues. Had normal echocardiogram and stress test in 2012.  Jaw pain Pt c/o jaw pain x 1 yr right side. Pt saw Dr. Redmond Baseman and had CT of right ear and jaw was negative told to follow up if it continued but didn't.she reports that she didn't.    Pain comes and goes. Will occur in the middle of the night. She did follow-up with her dentist who said that she could remove two teeth. History of significant sinus surgery in the past and since that time, has not had normal sinus/ear symptoms on that side of her face. Hasn't happened since she has been on the  prednisone.  Health Maintenance Due  Topic Date Due  . Fecal DNA (Cologuard)  Never done  . FOOT EXAM  09/09/2017  . OPHTHALMOLOGY EXAM  11/01/2017  . HEMOGLOBIN A1C  10/20/2018    Past Medical History:  Diagnosis Date  . Allergic rhinitis, cause unspecified   . Arthritis   . Bipolar disorder, unspecified ([place])    depression  . Cancer ([place])    skin cancer on left leg  . Chronic diastolic heart failure ([place])   . Depression   . GERD (gastroesophageal reflux disease)   . Headache   . History of gout    "haven't had it in several years" (02/08/2012)  . History of kidney stones   . History of wrist fracture    rt wrist  . Hypertension   . Iron deficiency anemia   . Kidney stones    sees urologist @ [facility]  . Morbid obesity ([place])   . Neuropathy due to secondary diabetes ([place])   . Nontoxic multinodular goiter   . Osteoporosis   . PTSD (post-traumatic stress disorder)   . Pure hypercholesterolemia   . RBBB   . Scleroderma ([place])   . Sleep apnea 7/14   mild osa-did not need cpap -dr Gwenette Greet  . Type II diabetes mellitus ([place])   . Unspecified hypothyroidism      Social History   Tobacco  Use  . Smoking status: Former Smoker    Packs/day: 1.00    Years: 6.00    Pack years: 6.00    Types: Cigarettes    Quit date: 04/06/1983    Years since quitting: 36.7  . Smokeless tobacco: Never Used  . Tobacco comment: Married, lives with spouse. Disable- 2 grown kids-6 g-kids  Vaping Use  . Vaping Use: Never used  Substance Use Topics  . Alcohol use: No    Comment: none  . Drug use: No    Past Surgical History:  Procedure Laterality Date  . ABDOMINAL HYSTERECTOMY  1976  . BREAST EXCISIONAL BIOPSY Left   . BREAST LUMPECTOMY WITH RADIOACTIVE SEED AND SENTINEL LYMPH NODE BIOPSY Left 04/25/2018   Procedure: LEFT BREAST LUMPECTOMY WITH RADIOACTIVE SEED AND SENTINEL LYMPH NODE BIOPSY;  Surgeon: Excell Seltzer, MD;  Location: [facility];  Service: General;  Laterality: Left;  . BREAST  SURGERY    . CARDIAC CATHETERIZATION  2001   sees Dr Peter Martinique  . CATARACT EXTRACTION  2014  . CATARACT EXTRACTION, BILATERAL  02/2011   epps  . CHOLECYSTECTOMY  1985  . EXCISIONAL HEMORRHOIDECTOMY     "dr cut out in his office" (02/08/2012)  . INCISIONAL BREAST BIOPSY  2000   right  . JOINT REPLACEMENT     rt knee  . KNEE ARTHROSCOPY  08/2009   right  . KNEE ARTHROSCOPY WITH MEDIAL MENISECTOMY     left  . Left Cystoscopy   1990  . LEFT OOPHORECTOMY  1980  . Lithotripsy (L) Kidney  1997  . LUMBAR LAMINECTOMY/DECOMPRESSION MICRODISCECTOMY Left 07/01/2016   Procedure: Laminectomy for synovial cyst - left - Lumbar four-five;  Surgeon: Earnie Larsson, MD;  Location: [facility];  Service: Neurosurgery;  Laterality: Left;  . PARTIAL MASTECTOMY WITH NEEDLE LOCALIZATION Left 09/01/2012   Procedure: PARTIAL MASTECTOMY WITH NEEDLE LOCALIZATION;  Surgeon: Adin Hector, MD;  Location: [facility];  Service: General;  Laterality: Left;  . Percitania stone removed (L) Kidney  1992  . Right nasal surgery  08/1988  . Right sinus removed  08/1989   tooth partial  . ROTATOR CUFF REPAIR  2013   right shoulder x 2  . SHOULDER ARTHROSCOPY WITH ROTATOR CUFF REPAIR AND SUBACROMIAL DECOMPRESSION Left 08/20/2013   Procedure: SHOULDER ARTHROSCOPY  AND SUBACROMIAL DECOMPRESSION;  Surgeon: Nita Sells, MD;  Location: [facility];  Service: Orthopedics;  Laterality: Left;  Left shoulder arthroscopy, debridement, subacromial decompression, distal clavical resection  . THYROIDECTOMY  04/22/2011   Procedure: THYROIDECTOMY;  Surgeon: Onnie Graham, MD;  Location: [facility];  Service: ENT;  Laterality: N/A;  TOTAL THYROIDECOTMY  . TOTAL KNEE ARTHROPLASTY  06/18/2011   Procedure: TOTAL KNEE ARTHROPLASTY;  Surgeon: Kerin Salen, MD;  Location: [facility];  Service: Orthopedics;  Laterality: Left;  DEPUY  . TOTAL KNEE ARTHROPLASTY  02/07/2012   Procedure: TOTAL KNEE ARTHROPLASTY;  Surgeon: Kerin Salen, MD;   Location: [facility];  Service: Orthopedics;  Laterality: Right;  . TUBAL LIGATION  1972    Family History  Problem Relation Age of Onset  . Hypertension Mother   . Lung cancer Mother        non-smoker  . Diabetes Father   . Hypertension Father   . Hyperlipidemia Father   . Heart attack Other   . Coronary artery disease Other   . Hypertension Sister   . Hypertension Brother   . Hypertension Brother   . Hypertension Brother   .  Hypertension Sister   . Hypertension Sister   . Diabetes Daughter     PMHx, SurgHx, SocialHx, FamHx, Medications, and Allergies were reviewed in the Visit Navigator and updated as appropriate.   Patient Active Problem List   Diagnosis Date Noted  . Right carotid bruit 12/25/2018  . Stress incontinence in female 12/06/2018  . Antalgic gait 12/02/2018  . Malignant neoplasm of upper-outer quadrant of left breast in female, estrogen receptor positive ([place]) 05/10/2018  . Temporomandibular joint (TMJ) pain 03/23/2018  . Tophus of toe concurrent with and due to gout 12/16/2017  . Mass in the abdomen 03/17/2017  . Degenerative lumbar spinal stenosis 02/10/2017  . Spondylolisthesis at L4-L5 level 02/10/2017  . Cervical radiculopathy 01/18/2017  . Nausea 12/23/2016  . Depression, major, recurrent, moderate ([place]) 11/19/2016  . Diabetic neuropathy, painful ([place]) 11/19/2016  . Hypothyroidism, 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/19/2016  . OSA (obstructive sleep apnea) 11/19/2016  . PTSD (post-traumatic stress disorder) 11/19/2016  . 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 Poorly controlled type 2 diabetes mellitus with circulatory disorder ([place]) 06/10/2015  . Right bundle branch block 04/18/2015  . Bifascicular block 04/18/2015  . Radiculopathy, lumbar region 05/31/2014  . Greater trochanteric bursitis of left hip 04/22/2014  . Achilles tendinosis 02/19/2014  . Gait instability 11/14/2013  . Recurrent falls 11/14/2013  . Multiple fractures 11/14/2013  . Calculus of kidney 07/25/2013  .  Hypercalciuria 07/25/2013  . Pain in joint involving ankle and foot 07/02/2013  . Migraine 07/10/2012  . Osteoarthritis of right knee 02/10/2012  . Osteoarthritis of left knee 06/21/2011  . GERD (gastroesophageal reflux disease) 02/09/2011  . Vitamin D deficiency 08/27/2010  . Fibromyalgia 03/23/2010  . Goiter 03/24/2009  . Edema 09/06/2008  . Morbid (severe) obesity due to excess calories ([place]) 09/03/2008  . Hypercholesterolemia 06/10/2008  . Bipolar 1 disorder ([place]) 06/10/2008  . Hypertension associated with diabetes ([place]) 06/10/2008  . Allergic rhinitis 06/10/2008    Social History   Tobacco Use  . Smoking status: Former Smoker    Packs/day: 1.00    Years: 6.00    Pack years: 6.00    Types: Cigarettes    Quit date: 04/06/1983    Years since quitting: 36.7  . Smokeless tobacco: Never Used  . Tobacco comment: Married, lives with spouse. Disable- 2 grown kids-6 g-kids  Vaping Use  . Vaping Use: Never used  Substance Use Topics  . Alcohol use: No    Comment: none  . Drug use: No    Current Medications and Allergies:    Current Outpatient Medications:  .  allopurinol (ZYLOPRIM) 300 MG tablet, Take 300 mg by mouth daily., Disp: , Rfl:  .  anastrozole (ARIMIDEX) 1 MG tablet, Take 1 tablet (1 mg total) by mouth daily., Disp: 90 tablet, Rfl: 1 .  Biotin 10 MG CAPS, Take 10 mg by mouth at bedtime., Disp: , Rfl:  .  BLACK ELDERBERRY PO, Take 200 mg by mouth daily., Disp: , Rfl:  .  Blood Glucose Monitoring Suppl (ACCU-CHEK AVIVA PLUS) w/Device KIT, USE TO CHECK BLOOD SUGAR 2 TIMES PER DAY. (Patient taking differently: 1 each by Other route See admin instructions. USE TO CHECK BLOOD SUGAR 2 TIMES PER DAY.), Disp: 1 kit, Rfl: 2 .  Calcium Carbonate-Vitamin D (CALTRATE 600+D PO), Take 1 capsule by mouth in the morning and at bedtime., Disp: , Rfl:  .  cetirizine (ZYRTEC) 10 MG tablet, Take 20 mg by mouth daily. , Disp: , Rfl:  .  Cholecalciferol (VITAMIN D3) 50 MCG (2000  UT) TABS,  Take 2,000 Units by mouth daily., Disp: , Rfl:  .  doxepin (SINEQUAN) 10 MG capsule, Take 1 capsule by mouth daily., Disp: , Rfl:  .  furosemide (LASIX) 40 MG tablet, Take 1 tablet (40 mg total) by mouth daily., Disp: 90 tablet, Rfl: 3 .  gabapentin (NEURONTIN) 600 MG tablet, Take 600-900 mg by mouth See admin instructions. Take 600 mg by mouth three times daily and take 900 mg by mouth at bedtime, Disp: , Rfl:  .  GLUCOSAMINE-CHONDROITIN PO, Take 1 tablet by mouth 2 (two) times daily., Disp: , Rfl:  .  glucose blood (ACCU-CHEK AVIVA PLUS) test strip, USE TO CHECK BLOOD SUGAR TWO TIMES PER DAY. (Patient taking differently: 1 each by Other route See admin instructions. USE TO CHECK BLOOD SUGAR TWO TIMES PER DAY.), Disp: 200 each, Rfl: 3 .  Insulin NPH, Human,, Isophane, (NOVOLIN N FLEXPEN) 100 UNIT/ML Kiwkpen, INJECT  30 UNITS SUBCUTANEOUSLY TWICE DAILY, Disp: , Rfl:  .  Insulin Syringe-Needle U-100 (INSULIN SYRINGE 1CC/31GX5/16") 31G X 5/16" 1 ML MISC, Use to inject insulin two times per day. (Patient taking differently: 1 each by Other route See admin instructions. Use to inject insulin two times per day.), Disp: 200 each, Rfl: 2 .  lamoTRIgine (LAMICTAL) 200 MG tablet, Take 1 tablet (200 mg total) by mouth daily at 12 noon., Disp: 90 tablet, Rfl: 3 .  Lancets (ACCU-CHEK MULTICLIX) lancets, Use to check blood sugar two time per day. (Patient taking differently: 1 each by Other route in the morning and at bedtime. Use to check blood sugar two time per day.), Disp: 200 each, Rfl: 2 .  levothyroxine (SYNTHROID, LEVOTHROID) 50 MCG tablet, Take 1 tablet (50 mcg total) by mouth daily. --- Office visit needed for further refills (Patient taking differently: Take 50 mcg by mouth daily before breakfast. ), Disp: 90 tablet, Rfl: 0 .  lisinopril (PRINIVIL,ZESTRIL) 5 MG tablet, Take 5 mg by mouth daily., Disp: , Rfl:  .  Melatonin 10 MG CAPS, Take 10 mg by mouth at bedtime., Disp: , Rfl:  .  metFORMIN (GLUCOPHAGE)  500 MG tablet, Take 500 mg by mouth 2 (two) times daily with a meal., Disp: , Rfl:  .  omeprazole (PRILOSEC) 20 MG capsule, Take 1 capsule (20 mg total) by mouth daily. Take 30 minutes before a meal (Patient taking differently: Take 20 mg by mouth daily at 12 noon. Take 30 minutes before a meal), Disp: 90 capsule, Rfl: 3 .  OVER THE COUNTER MEDICATION, Take 1 tablet by mouth daily. Bacopa, Disp: , Rfl:  .  oxybutynin (DITROPAN-XL) 10 MG 24 hr tablet, Take 1 tablet (10 mg total) by mouth every evening. (Patient taking differently: Take 10 mg by mouth at bedtime. ), Disp: 90 tablet, Rfl: 3 .  potassium citrate (UROCIT-K) 10 MEQ (1080 MG) SR tablet, , Disp: , Rfl:  .  pravastatin (PRAVACHOL) 20 MG tablet, Take 1 tablet by mouth at bedtime., Disp: , Rfl:  .  predniSONE (DELTASONE) 5 MG tablet, , Disp: , Rfl:  .  sertraline (ZOLOFT) 50 MG tablet, Take 1 tablet (50 mg total) by mouth daily., Disp: 90 tablet, Rfl: 3 .  tamsulosin (FLOMAX) 0.4 MG CAPS capsule, Take 0.4 mg by mouth daily., Disp: , Rfl:   No Known Allergies  Review of Systems   ROS  Negative unless otherwise specified per HPI.  Vitals:   Vitals:   12/28/19 1418  BP: (!) 118/58  Pulse: 80  Temp: 97.9  F (36.6 C)  TempSrc: Temporal  SpO2: 95%  Weight: 252 lb 4 oz (114.4 kg)  Height: _0  (1.6 m)     Body mass index is 44.68 kg/m.   Physical Exam:    Physical Exam Vitals and nursing note reviewed.  Constitutional:      General: She is not in acute distress.    Appearance: She is well-developed. She is not ill-appearing or toxic-appearing.  Cardiovascular:     Rate and Rhythm: Normal rate and regular rhythm.     Pulses: Normal pulses.     Heart sounds: Normal heart sounds, S1 normal and S2 normal.     Comments: No LE edema Pulmonary:     Effort: Pulmonary effort is normal.     Breath sounds: Normal breath sounds.  Musculoskeletal:     Comments: Bilateral knees with swelling   Skin:    General: Skin is warm and  dry.  Neurological:     Mental Status: She is alert.     GCS: GCS eye subscore is 4. GCS verbal subscore is 5. GCS motor subscore is 6.  Psychiatric:        Speech: Speech normal.        Behavior: Behavior normal. Behavior is cooperative.      Assessment and Plan:    Brittany Acosta was seen today for establish care, leg swelling and jaw pain.  Diagnoses and all orders for this visit:  Knee swelling Mgmt per ortho/sports medicine.  Jaw pain Mgmt per ENT. Recommend follow-up with them.  Swelling of lower extremity Significant PMH of multiple cardiac risk factors and strong family hx of heart disease. Will refer to cardiology for further evaluation and mgmt of her diuresis.  Need for immunization against influenza -     Flu Vaccine QUAD High Dose(Fluad)   CMA or LPN served as scribe during this visit. History, Physical, and Plan performed by medical provider. The above documentation has been reviewed and is accurate and complete.   Inda Coke, PA-C [place], [place] [facility] 12/28/2019  Follow-up: No follow-ups on file.

## 2020-01-08 ENCOUNTER — Telehealth: Payer: Self-pay

## 2020-01-08 MED ORDER — ANASTROZOLE 1 MG PO TABS
1.0000 mg | ORAL_TABLET | Freq: Every day | ORAL | 1 refills | Status: DC
Start: 2020-01-08 — End: 2020-01-15

## 2020-01-08 MED ORDER — ALLOPURINOL 300 MG PO TABS
300.0000 mg | ORAL_TABLET | Freq: Every day | ORAL | 2 refills | Status: DC
Start: 2020-01-08 — End: 2020-01-15

## 2020-01-08 NOTE — Telephone Encounter (Signed)
Spoke to pt told her we can fill Rx for Allopurinol, but. Pt interrupted and said they have already been filled. Check in chart and told her yes both prescriptions were sent to pharmacy see you at your appt in Nov. Pt verbalized understanding.

## 2020-01-08 NOTE — Telephone Encounter (Signed)
MEDICATION: allopurinol & anastrozole   PH[facility]Y: [facility]  Comments:   **Let patient know to contact pharmacy at the end of the day to make sure medication is ready. **  ** Please notify patient to allow 48-72 hours to process**  **Encourage patient to contact the pharmacy for refills or they can request refills through [facility]**

## 2020-01-08 NOTE — Telephone Encounter (Signed)
Refills sent in

## 2020-01-15 ENCOUNTER — Other Ambulatory Visit: Payer: Self-pay | Admitting: *Deleted

## 2020-01-15 MED ORDER — ANASTROZOLE 1 MG PO TABS
1.0000 mg | ORAL_TABLET | Freq: Every day | ORAL | 1 refills | Status: DC
Start: 2020-01-15 — End: 2020-04-03

## 2020-01-15 MED ORDER — LISINOPRIL 5 MG PO TABS
5.0000 mg | ORAL_TABLET | Freq: Every day | ORAL | 1 refills | Status: DC
Start: 2020-01-15 — End: 2020-06-22

## 2020-01-15 MED ORDER — ALLOPURINOL 300 MG PO TABS
300.0000 mg | ORAL_TABLET | Freq: Every day | ORAL | 1 refills | Status: DC
Start: 2020-01-15 — End: 2020-10-20

## 2020-01-15 MED ORDER — SERTRALINE HCL 50 MG PO TABS
50.0000 mg | ORAL_TABLET | Freq: Every day | ORAL | 1 refills | Status: DC
Start: 2020-01-15 — End: 2020-01-25

## 2020-01-15 NOTE — Telephone Encounter (Signed)
Spoke to pt told her received refill requests from [facility] just wanted to clarify that you are taking Lisinopril 5 mg daily? Pt said yes. Told her okay the request was for 10 mg. Pt said she cuts it in half. Told pt you should not due that it comes in a 5 mg tablet. Pt said that is fine. Told her okay will send Rx's to [facility]. Pt verbalized understanding.

## 2020-01-21 ENCOUNTER — Ambulatory Visit: Payer: Medicare HMO | Admitting: Internal Medicine

## 2020-01-22 ENCOUNTER — Ambulatory Visit: Payer: Medicare HMO | Admitting: Cardiology

## 2020-01-25 ENCOUNTER — Ambulatory Visit: Payer: Medicare HMO | Admitting: Cardiology

## 2020-01-25 ENCOUNTER — Encounter: Payer: Self-pay | Admitting: Cardiology

## 2020-01-25 ENCOUNTER — Other Ambulatory Visit: Payer: Self-pay

## 2020-01-25 VITALS — BP 123/60 | HR 65 | Ht 63.0 in | Wt 251.8 lb

## 2020-01-25 DIAGNOSIS — E7800 Pure hypercholesterolemia, unspecified: Secondary | ICD-10-CM

## 2020-01-25 DIAGNOSIS — I4510 Unspecified right bundle-branch block: Secondary | ICD-10-CM

## 2020-01-25 DIAGNOSIS — I10 Essential (primary) hypertension: Secondary | ICD-10-CM | POA: Insufficient documentation

## 2020-01-25 DIAGNOSIS — Z7189 Other specified counseling: Secondary | ICD-10-CM

## 2020-01-25 DIAGNOSIS — Z8249 Family history of ischemic heart disease and other diseases of the circulatory system: Secondary | ICD-10-CM

## 2020-01-25 DIAGNOSIS — R600 Localized edema: Secondary | ICD-10-CM | POA: Diagnosis not present

## 2020-01-25 DIAGNOSIS — E669 Obesity, unspecified: Secondary | ICD-10-CM | POA: Insufficient documentation

## 2020-01-25 DIAGNOSIS — E1169 Type 2 diabetes mellitus with other specified complication: Secondary | ICD-10-CM | POA: Insufficient documentation

## 2020-01-25 DIAGNOSIS — R0989 Other specified symptoms and signs involving the circulatory and respiratory systems: Secondary | ICD-10-CM

## 2020-01-25 DIAGNOSIS — I452 Bifascicular block: Secondary | ICD-10-CM

## 2020-01-25 LAB — LIPID PANEL
Chol/HDL Ratio: 2.8 ratio (ref 0.0–4.4)
Cholesterol, Total: 168 mg/dL (ref 100–199)
HDL: 59 mg/dL (ref >39)
LDL Chol Calc (NIH): 92 mg/dL (ref 0–99)
Triglycerides: 95 mg/dL (ref 0–149)
VLDL Cholesterol Cal: 17 mg/dL (ref 5–40)

## 2020-01-25 MED ORDER — ASPIRIN EC 81 MG PO TBEC: 81.0000 mg | DELAYED_RELEASE_TABLET | Freq: Every day | ORAL | 3 refills | Status: AC

## 2020-01-25 NOTE — Progress Notes (Signed)
Cardiology Office Note:    Date:  01/25/2020   ID:  Brittany Acosta, DOB 12-29-1950, MRN [id number]  PCP:  Inda Coke, PA  Cardiologist:  Buford Dresser, MD  Referring MD: Inda Coke, [place]   CC: new patient evaluation for LE edema.  History of Present Illness:    Brittany Acosta is a 69 y.o. female with a hx of hypertension, OSA, type II diabetes with diabetic neuropathy, hypercholesterolemia, obesity, fibromyalgia who is seen as a new consult at the request of Inda Coke, [place] for the evaluation and management of lower extremity edema.  Note from 12/28/19 with Inda Coke, PA reviewed. Noted that she was recently diagnosed with scleroderma. Also noted to have 6 mos history of bilateral leg swelling, right >left. Noted after a fall on both knees, went to ER, negative scan for DVT.   Today: Asking if her leg pain is neuropathy or PAD. Has diffuse muscle pain, constant, bilateral shoulders. Has been getting physical therapy, with improvement.   Had a fall in June, hit both knees, and since then has had LE edema, right worse than left. Better if she is off her feet/props them up. No wounds.   Doesn't feel that lasix does much for her swelling. Has been having more issues with varicose veins.   Has chronic dyspnea on exertion with extended walking. She has anxiety about falling again and has been not walking much since June. Working on improving balance.  Has extensive family history: Mother had MI during childbirth with patient's youngest sibling. Died in her 54s. Several people on mother's side with heart disease. Father's side (7 or 8 brothers or sisters) with heart disease. Father died age 44 of an MI, had several MI prior.   Lipids, aspirin guidelines discussed today given her history of diabetes.  Denies chest pain, shortness of breath at rest. No PND, orthopnea, or unexpected weight gain. No syncope or palpitations.  Past Medical History:  Diagnosis Date  .  Allergic rhinitis, cause unspecified   . Arthritis   . Bipolar disorder, unspecified ([place])    depression  . Cancer ([place])    skin cancer on left leg  . Chronic diastolic heart failure ([place])   . Depression   . GERD (gastroesophageal reflux disease)   . Headache   . History of gout    "haven't had it in several years" (02/08/2012)  . History of kidney stones   . History of wrist fracture    rt wrist  . Hypertension   . Iron deficiency anemia   . Kidney stones    sees urologist @ [place]  . Morbid obesity ([place])   . Neuropathy due to secondary diabetes ([place])   . Nontoxic multinodular goiter   . Osteoporosis   . PTSD (post-traumatic stress disorder)   . Pure hypercholesterolemia   . RBBB   . Scleroderma ([place])   . Sleep apnea 7/14   mild osa-did not need cpap -dr Gwenette Greet  . Type II diabetes mellitus ([place])   . Unspecified hypothyroidism     Past Surgical History:  Procedure Laterality Date  . ABDOMINAL HYSTERECTOMY  1976  . BREAST EXCISIONAL BIOPSY Left   . BREAST LUMPECTOMY WITH RADIOACTIVE SEED AND SENTINEL LYMPH NODE BIOPSY Left 04/25/2018   Procedure: LEFT BREAST LUMPECTOMY WITH RADIOACTIVE SEED AND SENTINEL LYMPH NODE BIOPSY;  Surgeon: Excell Seltzer, MD;  Location: [facility];  Service: General;  Laterality: Left;  . BREAST SURGERY    . CARDIAC CATHETERIZATION  2001  sees Dr Peter Martinique  . CATARACT EXTRACTION  2014  . CATARACT EXTRACTION, BILATERAL  02/2011   epps  . CHOLECYSTECTOMY  1985  . EXCISIONAL HEMORRHOIDECTOMY     "dr cut out in his office" (02/08/2012)  . INCISIONAL BREAST BIOPSY  2000   right  . JOINT REPLACEMENT     rt knee  . KNEE ARTHROSCOPY  08/2009   right  . KNEE ARTHROSCOPY WITH MEDIAL MENISECTOMY     left  . Left Cystoscopy   1990  . LEFT OOPHORECTOMY  1980  . Lithotripsy (L) Kidney  1997  . LUMBAR LAMINECTOMY/DECOMPRESSION MICRODISCECTOMY Left 07/01/2016   Procedure: Laminectomy for synovial cyst - left - Lumbar four-five;  Surgeon: Earnie Larsson,  MD;  Location: [facility];  Service: Neurosurgery;  Laterality: Left;  . PARTIAL MASTECTOMY WITH NEEDLE LOCALIZATION Left 09/01/2012   Procedure: PARTIAL MASTECTOMY WITH NEEDLE LOCALIZATION;  Surgeon: Adin Hector, MD;  Location: [facility];  Service: General;  Laterality: Left;  . Percitania stone removed (L) Kidney  1992  . Right nasal surgery  08/1988  . Right sinus removed  08/1989   tooth partial  . ROTATOR CUFF REPAIR  2013   right shoulder x 2  . SHOULDER ARTHROSCOPY WITH ROTATOR CUFF REPAIR AND SUBACROMIAL DECOMPRESSION Left 08/20/2013   Procedure: SHOULDER ARTHROSCOPY  AND SUBACROMIAL DECOMPRESSION;  Surgeon: Nita Sells, MD;  Location: [facility];  Service: Orthopedics;  Laterality: Left;  Left shoulder arthroscopy, debridement, subacromial decompression, distal clavical resection  . THYROIDECTOMY  04/22/2011   Procedure: THYROIDECTOMY;  Surgeon: Onnie Graham, MD;  Location: [facility];  Service: ENT;  Laterality: N/A;  TOTAL THYROIDECOTMY  . TOTAL KNEE ARTHROPLASTY  06/18/2011   Procedure: TOTAL KNEE ARTHROPLASTY;  Surgeon: Kerin Salen, MD;  Location: [facility];  Service: Orthopedics;  Laterality: Left;  DEPUY  . TOTAL KNEE ARTHROPLASTY  02/07/2012   Procedure: TOTAL KNEE ARTHROPLASTY;  Surgeon: Kerin Salen, MD;  Location: [facility];  Service: Orthopedics;  Laterality: Right;  . TUBAL LIGATION  1972    Current Medications: Current Outpatient Medications on File Prior to Visit  Medication Sig  . allopurinol (ZYLOPRIM) 300 MG tablet Take 1 tablet (300 mg total) by mouth daily.  Marland Kitchen anastrozole (ARIMIDEX) 1 MG tablet Take 1 tablet (1 mg total) by mouth daily.  . Biotin 10 MG CAPS Take 10 mg by mouth at bedtime.  Marland Kitchen BLACK ELDERBERRY PO   . Blood Glucose Monitoring Suppl (ACCU-CHEK AVIVA PLUS) w/Device KIT USE TO CHECK BLOOD SUGAR 2 TIMES PER DAY. (Patient taking differently: 1 each by Other route See admin instructions. USE TO CHECK BLOOD SUGAR 2 TIMES PER DAY.)  . Calcium  Carbonate-Vitamin D (CALTRATE 600+D PO) Take 1 capsule by mouth in the morning and at bedtime.  . cetirizine (ZYRTEC) 10 MG tablet Take 20 mg by mouth daily.   . Cholecalciferol (VITAMIN D3 PO)   . doxepin (SINEQUAN) 10 MG capsule Take 1 capsule by mouth daily.  . folic acid (FOLVITE) 1 MG tablet Take 1 mg by mouth daily.  . furosemide (LASIX) 40 MG tablet Take 1 tablet (40 mg total) by mouth daily.  Marland Kitchen gabapentin (NEURONTIN) 600 MG tablet Take 600-900 mg by mouth See admin instructions. Take 600 mg by mouth three times daily and take 900 mg by mouth at bedtime  . GLUCOSAMINE-CHONDROITIN PO Take 1 tablet by mouth 2 (two) times daily.  Marland Kitchen glucose blood (ACCU-CHEK AVIVA PLUS) test strip USE TO CHECK  BLOOD SUGAR TWO TIMES PER DAY. (Patient taking differently: 1 each by Other route See admin instructions. USE TO CHECK BLOOD SUGAR TWO TIMES PER DAY.)  . Insulin NPH, Human,, Isophane, (NOVOLIN N FLEXPEN) 100 UNIT/ML Kiwkpen INJECT  30 UNITS SUBCUTANEOUSLY TWICE DAILY  . Insulin Syringe-Needle U-100 (INSULIN SYRINGE 1CC/31GX5/16") 31G X 5/16" 1 ML MISC Use to inject insulin two times per day. (Patient taking differently: 1 each by Other route See admin instructions. Use to inject insulin two times per day.)  . lamoTRIgine (LAMICTAL) 200 MG tablet Take 1 tablet (200 mg total) by mouth daily at 12 noon.  . Lancets (ACCU-CHEK MULTICLIX) lancets Use to check blood sugar two time per day. (Patient taking differently: 1 each by Other route in the morning and at bedtime. Use to check blood sugar two time per day.)  . levothyroxine (SYNTHROID, LEVOTHROID) 50 MCG tablet Take 1 tablet (50 mcg total) by mouth daily. --- Office visit needed for further refills (Patient taking differently: Take 50 mcg by mouth daily before breakfast. )  . lisinopril (ZESTRIL) 5 MG tablet Take 1 tablet (5 mg total) by mouth daily.  . metFORMIN (GLUCOPHAGE) 500 MG tablet Take 500 mg by mouth 2 (two) times daily with a meal.  . omeprazole  (PRILOSEC) 20 MG capsule Take 1 capsule (20 mg total) by mouth daily. Take 30 minutes before a meal (Patient taking differently: Take 20 mg by mouth daily at 12 noon. Take 30 minutes before a meal)  . OVER THE COUNTER MEDICATION Take 1 tablet by mouth daily. Bacopa  . oxybutynin (DITROPAN-XL) 10 MG 24 hr tablet Take 1 tablet (10 mg total) by mouth every evening. (Patient taking differently: Take 10 mg by mouth at bedtime. )  . potassium citrate (UROCIT-K) 10 MEQ (1080 MG) SR tablet   . pravastatin (PRAVACHOL) 20 MG tablet Take 1 tablet by mouth at bedtime.  . predniSONE (DELTASONE) 5 MG tablet   . sertraline (ZOLOFT) 100 MG tablet   . tamsulosin (FLOMAX) 0.4 MG CAPS capsule Take 0.4 mg by mouth daily.  . methotrexate 2.5 MG tablet    No current facility-administered medications on file prior to visit.     Allergies:   Patient has no known allergies.   Social History   Tobacco Use  . Smoking status: Former Smoker    Packs/day: 1.00    Years: 6.00    Pack years: 6.00    Types: Cigarettes    Quit date: 04/06/1983    Years since quitting: 36.8  . Smokeless tobacco: Never Used  . Tobacco comment: Married, lives with spouse. Disable- 2 grown kids-6 g-kids  Vaping Use  . Vaping Use: Never used  Substance Use Topics  . Alcohol use: No    Comment: none  . Drug use: No    Family History: family history includes Coronary artery disease in an other family member; Diabetes in her daughter and father; Heart attack in an other family member; Hyperlipidemia in her father; Hypertension in her brother, brother, brother, father, mother, sister, sister, and sister; Lung cancer in her mother. Mother had MI during childbirth with patient's youngest sibling. Died in her 6s. Several people on mother's side with heart disease. Father's side (7 or 8 brothers or sisters) with heart disease. Father died age 69 of an MI, had several MI prior.   ROS:   Please see the history of present illness.  Additional  pertinent ROS: Constitutional: Negative for chills, fever, night sweats, unintentional weight loss  HENT: Negative for ear pain and hearing loss.   Eyes: Negative for loss of vision and eye pain.  Respiratory: Negative for cough, sputum, wheezing.   Cardiovascular: See HPI. Gastrointestinal: Negative for abdominal pain, melena, and hematochezia.  Genitourinary: Negative for dysuria and hematuria.  Musculoskeletal: Positive for fall several months ago, diffuse myalgias Skin: Negative for itching and rash.  Neurological: Negative for focal weakness, focal sensory changes and loss of consciousness.  Endo/Heme/Allergies: Does not bruise/bleed easily.     EKGs/Labs/Other Studies Reviewed:    The following studies were reviewed today: Myoview 2012: normal Echo 2012:  - Left ventricle: The cavity size was normal. Wall thickness  was normal. Systolic function was normal. The estimated  ejection fraction was in the range of 55% to 60%. Wall  motion was normal; there were no regional wall motion  abnormalities. Doppler parameters are consistent with  abnormal left ventricular relaxation (grade 1 diastolic  dysfunction).  - Pericardium, extracardiac: A trivial pericardial effusion  was identified.   EKG:  EKG is personally reviewed.  The ekg ordered today demonstrates NSR with RBBB, LAFB at 65 bpm  Recent Labs: 09/14/2019: ALT 18; BUN 10; Creatinine, Ser 0.84; Hemoglobin 10.8; Platelets 460; Potassium 4.3; Sodium 139  Recent Lipid Panel    Component Value Date/Time   CHOL 157 06/26/2015 1213   TRIG 64.0 06/26/2015 1213   HDL 55.40 06/26/2015 1213   CHOLHDL 3 06/26/2015 1213   VLDL 12.8 06/26/2015 1213   LDLCALC 89 06/26/2015 1213   LDLDIRECT 56.4 06/11/2011 0931    Physical Exam:    VS:  BP 123/60   Pulse 65   Ht 5' 3" (1.6 m)   Wt 251 lb 12.8 oz (114.2 kg)   SpO2 97%   BMI 44.60 kg/m     Wt Readings from Last 3 Encounters:  01/25/20 251 lb 12.8 oz (114.2  kg)  12/28/19 252 lb 4 oz (114.4 kg)  09/14/19 255 lb (115.7 kg)    GEN: Well nourished, well developed in no acute distress HEENT: Normal, moist mucous membranes NECK: No JVD CARDIAC: regular rhythm, normal S1 and S2, no rubs or gallops. No murmurs. VASCULAR: Radial and DP pulses 2+ bilaterally. Faint R carotid bruit RESPIRATORY:  Clear to auscultation without rales, wheezing or rhonchi  ABDOMEN: Soft, non-tender, non-distended MUSCULOSKELETAL:  Ambulates independently SKIN: Warm and dry. Bilateral trace to 1+ edema, right slightly more than left NEUROLOGIC:  Alert and oriented x 3. No focal neuro deficits noted. PSYCHIATRIC:  Normal affect    ASSESSMENT:    1. Bilateral leg edema   2. Family history of heart disease   3. Essential hypertension   4. Hypercholesterolemia   5. Right bundle branch block   6. Bifascicular block   7. Right carotid bruit   8. Cardiac risk counseling   9. Counseling on health promotion and disease prevention   10. Diabetes mellitus type 2 in obese [facility])    PLAN:    Lower extremity edema -will get echocardiogram to evaluate for cardiac etiology -minimal improvement with furosemide -counseled on compression stockings, elevation  R carotid bruit: -will get carotid duplex ultrasound -aspirin, statin as below  Family history of CV disease Type II diabetes, with obesity -discussed recommendations/guidelines regarding aspirin and statin today -she is willing to start aspirin 81 mg today, ordered -she is on pravastatin. We discussed changing to a higher intensity statin, but she is having diffuse myalgias at baseline and is worried about these worsening with changing statin.  Continue to discuss  Hypertension: at goal of <130/80 today -continue lisinopril  Abnormal ECG: RBBB, LAFB (bifasicular block) -no syncope or red flag symptoms  Cardiac risk counseling and prevention recommendations: -recommend heart healthy/Mediterranean diet, with whole  grains, fruits, vegetable, fish, lean meats, nuts, and olive oil. Limit salt. -recommend moderate walking, 3-5 times/week for 30-50 minutes each session. Aim for at least 150 minutes.week. Goal should be pace of 3 miles/hours, or walking 1.5 miles in 30 minutes -recommend avoidance of tobacco products. Avoid excess alcohol. -ASCVD risk score: The 10-year ASCVD risk score Mikey Bussing DC Brooke Bonito., et al., 2013) is: 18.1%   Values used to calculate the score:     Age: 36 years     Sex: Female     Is Non-Hispanic African American: No     Diabetic: Yes     Tobacco smoker: No     Systolic Blood Pressure: 409 mmHg     Is BP treated: Yes     HDL Cholesterol: 48 MG/DL     Total Cholesterol: 148 mg/dL    Plan for follow up: if echo unremarkable, follow up every 2 years given CV risk with family history  Buford Dresser, MD, PhD [place]  [facility]    Medication Adjustments/Labs and Tests Ordered: Current medicines are reviewed at length with the patient today.  Concerns regarding medicines are outlined above.  Orders Placed This Encounter  Procedures  . Lipid panel  . EKG 12-Lead  . ECHOCARDIOGRAM COMPLETE  . VAS US CAROTID   Meds ordered this encounter  Medications  . aspirin EC 81 MG tablet    Sig: Take 1 tablet (81 mg total) by mouth daily. Swallow whole.    Dispense:  90 tablet    Refill:  3    Patient Instructions  Medication Instructions:  Start Aspirin 81 mg daily   *If you need a refill on your cardiac medications before your next appointment, please call your pharmacy*   Lab Work: Your physician recommends that you return for lab work today ( BMP).   If you have labs (blood work) drawn today and your tests are completely normal, you will receive your results only by: Marland Kitchen MyChart Message (if you have MyChart) OR . A paper copy in the mail If you have any lab test that is abnormal or we need to change your treatment, we will call you to review the  results.   Testing/Procedures: Your physician has requested that you have an echocardiogram. Echocardiography is a painless test that uses sound waves to create images of your heart. It provides your doctor with information about the size and shape of your heart and how well your heart's chambers and valves are working. This procedure takes approximately one hour. There are no restrictions for this procedure. [facility] has requested that you have a carotid duplex. This test is an ultrasound of the carotid arteries in your neck. It looks at blood flow through these arteries that supply the brain with blood. Allow one hour for this exam. There are no restrictions or special instructions. [place]   Follow-Up: At [facility], you and your health needs are our priority.  As part of our continuing mission to provide you with exceptional heart care, we have created designated Provider Care Teams.  These Care Teams include your primary Cardiologist (physician) and Advanced Practice Providers (APPs -  Physician Assistants and Nurse Practitioners) who all work  together to provide you with the care you need, when you need it.  We recommend signing up for the patient portal called "MyChart".  Sign up information is provided on this After Visit Summary.  MyChart is used to connect with patients for Virtual Visits (Telemedicine).  Patients are able to view lab/test results, encounter notes, upcoming appointments, etc.  Non-urgent messages can be sent to your provider as well.   To learn more about what you can do with MyChart, go to [email or web address].    Your next appointment:   2 year(s)  The format for your next appointment:   In Person  Provider:   Buford Dresser, MD      Signed, Buford Dresser, MD PhD 01/25/2020  [place]

## 2020-01-25 NOTE — Patient Instructions (Signed)
Medication Instructions:  Start Aspirin 81 mg daily   *If you need a refill on your cardiac medications before your next appointment, please call your pharmacy*   Lab Work: Your physician recommends that you return for lab work today ( BMP).   If you have labs (blood work) drawn today and your tests are completely normal, you will receive your results only by: Marland Kitchen MyChart Message (if you have MyChart) OR . A paper copy in the mail If you have any lab test that is abnormal or we need to change your treatment, we will call you to review the results.   Testing/Procedures: Your physician has requested that you have an echocardiogram. Echocardiography is a painless test that uses sound waves to create images of your heart. It provides your doctor with information about the size and shape of your heart and how well your heart's chambers and valves are working. This procedure takes approximately one hour. There are no restrictions for this procedure. [facility] has requested that you have a carotid duplex. This test is an ultrasound of the carotid arteries in your neck. It looks at blood flow through these arteries that supply the brain with blood. Allow one hour for this exam. There are no restrictions or special instructions. [place]   Follow-Up: At [facility], you and your health needs are our priority.  As part of our continuing mission to provide you with exceptional heart care, we have created designated Provider Care Teams.  These Care Teams include your primary Cardiologist (physician) and Advanced Practice Providers (APPs -  Physician Assistants and Nurse Practitioners) who all work together to provide you with the care you need, when you need it.  We recommend signing up for the patient portal called "MyChart".  Sign up information is provided on this After Visit Summary.  MyChart is used to connect with patients for Virtual  Visits (Telemedicine).  Patients are able to view lab/test results, encounter notes, upcoming appointments, etc.  Non-urgent messages can be sent to your provider as well.   To learn more about what you can do with MyChart, go to [email or web address].    Your next appointment:   2 year(s)  The format for your next appointment:   In Person  Provider:   Buford Dresser, MD

## 2020-02-01 ENCOUNTER — Telehealth: Payer: Self-pay | Admitting: Cardiology

## 2020-02-01 ENCOUNTER — Encounter: Payer: Self-pay | Admitting: Cardiology

## 2020-02-01 NOTE — Telephone Encounter (Signed)
Pt updated with lab results and verbalized understanding.  

## 2020-02-01 NOTE — Telephone Encounter (Signed)
Follow up:       Patient calling to get results from her test. Please call patient back.

## 2020-02-05 ENCOUNTER — Telehealth: Payer: Self-pay | Admitting: Physician Assistant

## 2020-02-05 NOTE — Telephone Encounter (Signed)
Left message for patient to call back and schedule Medicare Annual Wellness Visit (AWV) either virtually/audio only OR in office. Whatever the patients preference is.  Last AWV 06/09/15; please schedule at anytime with [facility]-Nurse Health Advisor at [facility].  This should be a 45 minute visit.

## 2020-02-06 ENCOUNTER — Other Ambulatory Visit: Payer: Self-pay

## 2020-02-06 ENCOUNTER — Ambulatory Visit (HOSPITAL_COMMUNITY)
Admission: RE | Admit: 2020-02-06 | Discharge: 2020-02-06 | Disposition: A | Discharge status: Home / Self Care | Payer: Medicare HMO | Source: Ambulatory Visit | Attending: Cardiology | Admitting: Cardiology

## 2020-02-06 ENCOUNTER — Other Ambulatory Visit: Payer: Self-pay | Admitting: Adult Health

## 2020-02-06 DIAGNOSIS — R0989 Other specified symptoms and signs involving the circulatory and respiratory systems: Secondary | ICD-10-CM | POA: Diagnosis not present

## 2020-02-06 DIAGNOSIS — Z9889 Other specified postprocedural states: Secondary | ICD-10-CM

## 2020-02-08 ENCOUNTER — Encounter: Payer: Self-pay | Admitting: Physician Assistant

## 2020-02-08 ENCOUNTER — Other Ambulatory Visit: Payer: Self-pay

## 2020-02-08 ENCOUNTER — Ambulatory Visit (INDEPENDENT_AMBULATORY_CARE_PROVIDER_SITE_OTHER): Payer: Medicare HMO | Admitting: Physician Assistant

## 2020-02-08 VITALS — BP 140/60 | HR 70 | Temp 98.2°F | Ht 63.0 in | Wt 255.2 lb

## 2020-02-08 DIAGNOSIS — E1159 Type 2 diabetes mellitus with other circulatory complications: Secondary | ICD-10-CM | POA: Diagnosis not present

## 2020-02-08 DIAGNOSIS — E1165 Type 2 diabetes mellitus with hyperglycemia: Secondary | ICD-10-CM

## 2020-02-08 MED ORDER — INSULIN DEGLUDEC 100 UNIT/ML ~~LOC~~ SOPN: PEN_INJECTOR | SUBCUTANEOUS | 0 refills | Status: DC

## 2020-02-08 NOTE — Progress Notes (Signed)
Brittany Acosta is a 69 y.o. female is here for follow up.  I acted as a Education administrator for [employer], PA-C Anselmo Pickler, LPN   History of Present Illness:   Chief Complaint  Patient presents with  . Diabetes    HPI   Diabetes Pt following up, currently checking blood sugars twice a day. Fasting sugars average 68, before supper averaging 120. Currently taking  Metformin 500 mg twice a day and Novulin insulin 30 units BID. Pt has been having some hypoglycemia signs in the morning -- feeling shaky, dizzy, lightheaded. She will grab a coke, peanut butter, glucose tablets and take that to help with her symptoms.   Health Maintenance Due  Topic Date Due  . Fecal DNA (Cologuard)  Never done  . OPHTHALMOLOGY EXAM  11/01/2017  . HEMOGLOBIN A1C  10/20/2018    Past Medical History:  Diagnosis Date  . Allergic rhinitis, cause unspecified   . Arthritis   . Bipolar disorder, unspecified ([place])    depression  . Cancer ([place])    skin cancer on left leg  . Chronic diastolic heart failure ([place])   . Depression   . GERD (gastroesophageal reflux disease)   . Headache   . History of gout    "haven't had it in several years" (02/08/2012)  . History of kidney stones   . History of wrist fracture    rt wrist  . Hypertension   . Iron deficiency anemia   . Kidney stones    sees urologist @ [facility]  . Morbid obesity ([place])   . Neuropathy due to secondary diabetes ([place])   . Nontoxic multinodular goiter   . Osteoporosis   . PTSD (post-traumatic stress disorder)   . Pure hypercholesterolemia   . RBBB   . Scleroderma ([place])   . Sleep apnea 7/14   mild osa-did not need cpap -dr Gwenette Greet  . Type II diabetes mellitus ([place])   . Unspecified hypothyroidism      Social History   Tobacco Use  . Smoking status: Former Smoker    Packs/day: 1.00    Years: 6.00    Pack years: 6.00    Types: Cigarettes    Quit date: 04/06/1983    Years since quitting: 36.8  . Smokeless tobacco: Never Used  .  Tobacco comment: Married, lives with spouse. Disable- 2 grown kids-6 g-kids  Vaping Use  . Vaping Use: Never used  Substance Use Topics  . Alcohol use: No    Comment: none  . Drug use: No    Past Surgical History:  Procedure Laterality Date  . ABDOMINAL HYSTERECTOMY  1976  . BREAST EXCISIONAL BIOPSY Left   . BREAST LUMPECTOMY WITH RADIOACTIVE SEED AND SENTINEL LYMPH NODE BIOPSY Left 04/25/2018   Procedure: LEFT BREAST LUMPECTOMY WITH RADIOACTIVE SEED AND SENTINEL LYMPH NODE BIOPSY;  Surgeon: Excell Seltzer, MD;  Location: [facility];  Service: General;  Laterality: Left;  . BREAST SURGERY    . CARDIAC CATHETERIZATION  2001   sees Dr Peter Martinique  . CATARACT EXTRACTION  2014  . CATARACT EXTRACTION, BILATERAL  02/2011   epps  . CHOLECYSTECTOMY  1985  . EXCISIONAL HEMORRHOIDECTOMY     "dr cut out in his office" (02/08/2012)  . INCISIONAL BREAST BIOPSY  2000   right  . JOINT REPLACEMENT     rt knee  . KNEE ARTHROSCOPY  08/2009   right  . KNEE ARTHROSCOPY WITH MEDIAL MENISECTOMY     left  . Left Cystoscopy  [place]  . Lithotripsy (L) Kidney  1997  . LUMBAR LAMINECTOMY/DECOMPRESSION MICRODISCECTOMY Left 07/01/2016   Procedure: Laminectomy for synovial cyst - left - Lumbar four-five;  Surgeon: Earnie Larsson, MD;  Location: [facility];  Service: Neurosurgery;  Laterality: Left;  . PARTIAL MASTECTOMY WITH NEEDLE LOCALIZATION Left 09/01/2012   Procedure: PARTIAL MASTECTOMY WITH NEEDLE LOCALIZATION;  Surgeon: Adin Hector, MD;  Location: [facility];  Service: General;  Laterality: Left;  . Percitania stone removed (L) Kidney  1992  . Right nasal surgery  08/1988  . Right sinus removed  08/1989   tooth partial  . ROTATOR CUFF REPAIR  2013   right shoulder x 2  . SHOULDER ARTHROSCOPY WITH ROTATOR CUFF REPAIR AND SUBACROMIAL DECOMPRESSION Left 08/20/2013   Procedure: SHOULDER ARTHROSCOPY  AND SUBACROMIAL DECOMPRESSION;  Surgeon: Nita Sells, MD;  Location: [facility];  Service: Orthopedics;  Laterality: Left;  Left shoulder arthroscopy, debridement, subacromial decompression, distal clavical resection  . THYROIDECTOMY  04/22/2011   Procedure: THYROIDECTOMY;  Surgeon: Onnie Graham, MD;  Location: [facility];  Service: ENT;  Laterality: N/A;  TOTAL THYROIDECOTMY  . TOTAL KNEE ARTHROPLASTY  06/18/2011   Procedure: TOTAL KNEE ARTHROPLASTY;  Surgeon: Kerin Salen, MD;  Location: [facility];  Service: Orthopedics;  Laterality: Left;  DEPUY  . TOTAL KNEE ARTHROPLASTY  02/07/2012   Procedure: TOTAL KNEE ARTHROPLASTY;  Surgeon: Kerin Salen, MD;  Location: [facility];  Service: Orthopedics;  Laterality: Right;  . TUBAL LIGATION  1972    Family History  Problem Relation Age of Onset  . Hypertension Mother   . Lung cancer Mother        non-smoker  . Diabetes Father   . Hypertension Father   . Hyperlipidemia Father   . Heart attack Other   . Coronary artery disease Other   . Hypertension Sister   . Hypertension Brother   . Hypertension Brother   . Hypertension Brother   . Hypertension Sister   . Hypertension Sister   . Diabetes Daughter     PMHx, SurgHx, SocialHx, FamHx, Medications, and Allergies were reviewed in the Visit Navigator and updated as appropriate.   Patient Active Problem List   Diagnosis Date Noted  . Bilateral leg edema 01/25/2020  . Family history of heart disease 01/25/2020  . Essential hypertension 01/25/2020  . Diabetes mellitus type 2 in obese ([place]) 01/25/2020  . Right carotid bruit 12/25/2018  . Stress incontinence in female 12/06/2018  . Antalgic gait 12/02/2018  . Malignant neoplasm of upper-outer quadrant of left breast in female, estrogen receptor positive ([place]) 05/10/2018  . Temporomandibular joint (TMJ) pain 03/23/2018  . Tophus of toe concurrent with and due to gout 12/16/2017  . Mass in the abdomen 03/17/2017  . Degenerative lumbar spinal stenosis 02/10/2017  . Spondylolisthesis at L4-L5 level 02/10/2017  .  Cervical radiculopathy 01/18/2017  . Nausea 12/23/2016  . Depression, major, recurrent, moderate ([place]) 11/19/2016  . Diabetic neuropathy, painful ([place]) 11/19/2016  . Hypothyroidism, 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/19/2016  . OSA (obstructive sleep apnea) 11/19/2016  . PTSD (post-traumatic stress disorder) 11/19/2016  . 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 Poorly controlled type 2 diabetes mellitus with circulatory disorder ([place]) 06/10/2015  . Right bundle branch block 04/18/2015  . Bifascicular block 04/18/2015  . Radiculopathy, lumbar region 05/31/2014  . Greater trochanteric bursitis of left hip 04/22/2014  . Achilles tendinosis 02/19/2014  . Gait instability 11/14/2013  . Recurrent falls 11/14/2013  . Multiple  fractures 11/14/2013  . Calculus of kidney 07/25/2013  . Hypercalciuria 07/25/2013  . Pain in joint involving ankle and foot 07/02/2013  . Migraine 07/10/2012  . Osteoarthritis of right knee 02/10/2012  . Osteoarthritis of left knee 06/21/2011  . GERD (gastroesophageal reflux disease) 02/09/2011  . Vitamin D deficiency 08/27/2010  . Fibromyalgia 03/23/2010  . Goiter 03/24/2009  . Edema 09/06/2008  . Morbid (severe) obesity due to excess calories ([place]) 09/03/2008  . Hypercholesterolemia 06/10/2008  . Bipolar 1 disorder ([place]) 06/10/2008  . Hypertension associated with diabetes ([place]) 06/10/2008  . Allergic rhinitis 06/10/2008    Social History   Tobacco Use  . Smoking status: Former Smoker    Packs/day: 1.00    Years: 6.00    Pack years: 6.00    Types: Cigarettes    Quit date: 04/06/1983    Years since quitting: 36.8  . Smokeless tobacco: Never Used  . Tobacco comment: Married, lives with spouse. Disable- 2 grown kids-6 g-kids  Vaping Use  . Vaping Use: Never used  Substance Use Topics  . Alcohol use: No    Comment: none  . Drug use: No    Current Medications and Allergies:    Current Outpatient Medications:  .  allopurinol (ZYLOPRIM) 300 MG tablet, Take 1 tablet (300 mg  total) by mouth daily., Disp: 90 tablet, Rfl: 1 .  anastrozole (ARIMIDEX) 1 MG tablet, Take 1 tablet (1 mg total) by mouth daily., Disp: 90 tablet, Rfl: 1 .  aspirin EC 81 MG tablet, Take 1 tablet (81 mg total) by mouth daily. Swallow whole., Disp: 90 tablet, Rfl: 3 .  Biotin 10 MG CAPS, Take 10 mg by mouth at bedtime., Disp: , Rfl:  .  BLACK ELDERBERRY PO, , Disp: , Rfl:  .  Blood Glucose Monitoring Suppl (ACCU-CHEK AVIVA PLUS) w/Device KIT, USE TO CHECK BLOOD SUGAR 2 TIMES PER DAY. (Patient taking differently: 1 each by Other route See admin instructions. USE TO CHECK BLOOD SUGAR 2 TIMES PER DAY.), Disp: 1 kit, Rfl: 2 .  Calcium Carbonate-Vitamin D (CALTRATE 600+D PO), Take 1 capsule by mouth in the morning and at bedtime., Disp: , Rfl:  .  cetirizine (ZYRTEC) 10 MG tablet, Take 20 mg by mouth daily. , Disp: , Rfl:  .  Cholecalciferol (VITAMIN D3 PO), Take 2,000 Units by mouth daily. , Disp: , Rfl:  .  doxepin (SINEQUAN) 10 MG capsule, Take 1 capsule by mouth daily., Disp: , Rfl:  .  folic acid (FOLVITE) 1 MG tablet, Take 1 mg by mouth daily., Disp: , Rfl:  .  furosemide (LASIX) 40 MG tablet, Take 1 tablet (40 mg total) by mouth daily., Disp: 90 tablet, Rfl: 3 .  gabapentin (NEURONTIN) 600 MG tablet, Take 600-900 mg by mouth See admin instructions. Take 600 mg by mouth three times daily and take 900 mg by mouth at bedtime, Disp: , Rfl:  .  GLUCOSAMINE-CHONDROITIN PO, Take 1 tablet by mouth 2 (two) times daily., Disp: , Rfl:  .  glucose blood (ACCU-CHEK AVIVA PLUS) test strip, USE TO CHECK BLOOD SUGAR TWO TIMES PER DAY. (Patient taking differently: 1 each by Other route See admin instructions. USE TO CHECK BLOOD SUGAR TWO TIMES PER DAY.), Disp: 200 each, Rfl: 3 .  Insulin Pen Needle (DROPLET PEN NEEDLES) 31G X 5 MM MISC, USE TWICE DAILY AS DIRECTED BY PHYSICIAN, Disp: , Rfl:  .  Insulin Syringe-Needle U-100 (INSULIN SYRINGE 1CC/31GX5/16") 31G X 5/16" 1 ML MISC, Use to inject insulin two  times per  day. (Patient taking differently: 1 each by Other route See admin instructions. Use to inject insulin two times per day.), Disp: 200 each, Rfl: 2 .  lamoTRIgine (LAMICTAL) 200 MG tablet, Take 1 tablet (200 mg total) by mouth daily at 12 noon., Disp: 90 tablet, Rfl: 3 .  Lancets (ACCU-CHEK MULTICLIX) lancets, Use to check blood sugar two time per day. (Patient taking differently: 1 each by Other route in the morning and at bedtime. Use to check blood sugar two time per day.), Disp: 200 each, Rfl: 2 .  levothyroxine (SYNTHROID, LEVOTHROID) 50 MCG tablet, Take 1 tablet (50 mcg total) by mouth daily. --- Office visit needed for further refills (Patient taking differently: Take 50 mcg by mouth daily before breakfast. ), Disp: 90 tablet, Rfl: 0 .  lisinopril (ZESTRIL) 5 MG tablet, Take 1 tablet (5 mg total) by mouth daily., Disp: 90 tablet, Rfl: 1 .  metFORMIN (GLUCOPHAGE) 500 MG tablet, Take 500 mg by mouth 2 (two) times daily with a meal., Disp: , Rfl:  .  methotrexate 2.5 MG tablet, Take 10 mg by mouth once a week. , Disp: , Rfl:  .  omeprazole (PRILOSEC) 20 MG capsule, Take 1 capsule (20 mg total) by mouth daily. Take 30 minutes before a meal (Patient taking differently: Take 20 mg by mouth daily at 12 noon. Take 30 minutes before a meal), Disp: 90 capsule, Rfl: 3 .  OVER THE COUNTER MEDICATION, Take 1 tablet by mouth daily. Bacopa, Disp: , Rfl:  .  oxybutynin (DITROPAN-XL) 10 MG 24 hr tablet, Take 1 tablet (10 mg total) by mouth every evening. (Patient taking differently: Take 10 mg by mouth at bedtime. ), Disp: 90 tablet, Rfl: 3 .  potassium citrate (UROCIT-K) 10 MEQ (1080 MG) SR tablet, , Disp: , Rfl:  .  pravastatin (PRAVACHOL) 20 MG tablet, Take 1 tablet by mouth at bedtime., Disp: , Rfl:  .  predniSONE (DELTASONE) 5 MG tablet, Take 5 mg by mouth daily with breakfast. , Disp: , Rfl:  .  sertraline (ZOLOFT) 100 MG tablet, Take 100 mg by mouth daily. , Disp: , Rfl:  .  tamsulosin (FLOMAX) 0.4 MG CAPS  capsule, Take 0.4 mg by mouth daily., Disp: , Rfl:  .  insulin degludec (TRESIBA) 100 UNIT/ML FlexTouch Pen, 30 units daily, decrease by 2 units q 3 days until fasting blood sugars are 100-120 range., Disp: 9 mL, Rfl: 0  No Known Allergies  Review of Systems   ROS Negative unless otherwise specified per HPI.  Vitals:   Vitals:   02/08/20 1329  BP: 140/60  Pulse: 70  Temp: 98.2 F (36.8 C)  TempSrc: Temporal  SpO2: 96%  Weight: 255 lb 4 oz (115.8 kg)  Height: _0  (1.6 m)     Body mass index is 45.22 kg/m.   Physical Exam:    Physical Exam Vitals and nursing note reviewed.  Constitutional:      General: She is not in acute distress.    Appearance: She is well-developed. She is not ill-appearing or toxic-appearing.  Cardiovascular:     Rate and Rhythm: Normal rate and regular rhythm.     Pulses: Normal pulses.     Heart sounds: Normal heart sounds, S1 normal and S2 normal.     Comments: No LE edema Pulmonary:     Effort: Pulmonary effort is normal.     Breath sounds: Normal breath sounds.  Skin:    General: Skin is warm and dry.  Neurological:     Mental Status: She is alert.     GCS: GCS eye subscore is 4. GCS verbal subscore is 5. GCS motor subscore is 6.  Psychiatric:        Speech: Speech normal.        Behavior: Behavior normal. Behavior is cooperative.      Assessment and Plan:    Wava was seen today for diabetes.  Diagnoses and all orders for this visit:  Poorly controlled type 2 diabetes mellitus with circulatory disorder ([place]) Update HgbA1c today. Will transition her from daily BID novolin to daily basal insulin. She was advised as follows: Stop your current insulin. We are going to switch to daily insulin to make things simpler for you.  Starting tomorrow, check your fasting blood sugars and then take 30 units of the new daily insulin. You will not take any additional insulin throughout the day.  Check fasting blood sugars each  morning.  After 3 days of daily 30 units of insulin, if your fasting blood sugars are less than 100, decrease by 2 units to 28 units of insulin.  Check fasting blood sugars each morning.  After 3 days of daily 28 units of insulin, if your fasting blood sugars are less than 100, decrease to 26 units of insulin.  Continue this cycle until your morning blood sugars are in the 100-120 range.  Let's follow-up in 1 month, sooner if you have concerns. -     Hemoglobin A1c; Future -     Hemoglobin A1c  Other orders -     insulin degludec (TRESIBA) 100 UNIT/ML FlexTouch Pen; 30 units daily, decrease by 2 units q 3 days until fasting blood sugars are 100-120 range.   CMA or LPN served as scribe during this visit. History, Physical, and Plan performed by medical provider. The above documentation has been reviewed and is accurate and complete.  Inda Coke, PA-C [place], [place] 02/08/2020  Follow-up: No follow-ups on file.

## 2020-02-08 NOTE — Patient Instructions (Addendum)
It was great to see you!  For your insulin: Stop your current insulin. We are going to switch to daily insulin to make things simpler for you.  Starting tomorrow, check your fasting blood sugars and then take 30 units of the new daily insulin. You will not take any additional insulin throughout the day.  Check fasting blood sugars each morning.  After 3 days of daily 30 units of insulin, if your fasting blood sugars are less than 100, decrease by 2 units to 28 units of insulin.  Check fasting blood sugars each morning.  After 3 days of daily 28 units of insulin, if your fasting blood sugars are less than 100, decrease to 26 units of insulin.  Continue this cycle until your morning blood sugars are in the 100-120 range.  Let's follow-up in 1 month, sooner if you have concerns.  Take care,  Inda Coke PA-C

## 2020-02-09 LAB — HEMOGLOBIN A1C
Hgb A1c MFr Bld: 6.6 % of total Hgb — ABNORMAL HIGH (ref <5.7)
Mean Plasma Glucose: 143 (calc)
eAG (mmol/L): 7.9 (calc)

## 2020-02-11 ENCOUNTER — Other Ambulatory Visit: Payer: Self-pay | Admitting: *Deleted

## 2020-02-11 MED ORDER — DROPLET PEN NEEDLES 31G X 5 MM MISC
3 refills | Status: DC
Start: 2020-02-11 — End: 2020-03-19

## 2020-02-15 ENCOUNTER — Other Ambulatory Visit: Payer: Self-pay

## 2020-02-15 ENCOUNTER — Encounter: Payer: Self-pay | Admitting: Neurology

## 2020-02-15 ENCOUNTER — Ambulatory Visit (HOSPITAL_COMMUNITY): Payer: Medicare HMO | Attending: Internal Medicine

## 2020-02-15 DIAGNOSIS — R600 Localized edema: Secondary | ICD-10-CM | POA: Diagnosis not present

## 2020-02-15 DIAGNOSIS — R202 Paresthesia of skin: Secondary | ICD-10-CM

## 2020-02-15 LAB — ECHOCARDIOGRAM COMPLETE
Area-P 1/2: 3.05 cm2
S' Lateral: 2.8 cm

## 2020-02-27 ENCOUNTER — Telehealth: Payer: Self-pay

## 2020-02-27 ENCOUNTER — Encounter: Payer: Self-pay | Admitting: Family

## 2020-02-27 ENCOUNTER — Other Ambulatory Visit: Payer: Self-pay

## 2020-02-27 ENCOUNTER — Ambulatory Visit (INDEPENDENT_AMBULATORY_CARE_PROVIDER_SITE_OTHER): Payer: Medicare HMO | Admitting: Family

## 2020-02-27 VITALS — BP 162/72 | HR 58 | Temp 97.0°F | Ht 63.0 in | Wt 256.4 lb

## 2020-02-27 DIAGNOSIS — R230 Cyanosis: Secondary | ICD-10-CM | POA: Diagnosis not present

## 2020-02-27 DIAGNOSIS — E1169 Type 2 diabetes mellitus with other specified complication: Secondary | ICD-10-CM | POA: Diagnosis not present

## 2020-02-27 DIAGNOSIS — E890 Postprocedural hypothyroidism: Secondary | ICD-10-CM

## 2020-02-27 DIAGNOSIS — E669 Obesity, unspecified: Secondary | ICD-10-CM

## 2020-02-27 DIAGNOSIS — E1140 Type 2 diabetes mellitus with diabetic neuropathy, unspecified: Secondary | ICD-10-CM

## 2020-02-27 DIAGNOSIS — T148XXA Other injury of unspecified body region, initial encounter: Secondary | ICD-10-CM

## 2020-02-27 LAB — CBC
HCT: 34.4 % — ABNORMAL LOW (ref 36.0–46.0)
Hemoglobin: 11.2 g/dL — ABNORMAL LOW (ref 12.0–15.0)
MCHC: 32.5 g/dL (ref 30.0–36.0)
MCV: 83.2 fl (ref 78.0–100.0)
Platelets: 428 10*3/uL — ABNORMAL HIGH (ref 150.0–400.0)
RBC: 4.13 Mil/uL (ref 3.87–5.11)
RDW: 18 % — ABNORMAL HIGH (ref 11.5–15.5)
WBC: 10.1 10*3/uL (ref 4.0–10.5)

## 2020-02-27 LAB — TSH: TSH: 2.93 u[IU]/mL (ref 0.35–4.50)

## 2020-02-27 MED ORDER — PREGABALIN 50 MG PO CAPS: 50.0000 mg | ORAL_CAPSULE | Freq: Two times a day (BID) | ORAL | 1 refills | Status: DC

## 2020-02-27 NOTE — Telephone Encounter (Signed)
Patient is seeing Justin Mend today at 10:40 am.

## 2020-02-27 NOTE — Patient Instructions (Signed)

## 2020-02-27 NOTE — Progress Notes (Signed)
Acute Office Visit  Subjective:    Patient ID: Brittany Acosta, female    DOB: April 17, 1950, 69 y.o.   MRN: [id number]  Chief Complaint  Patient presents with  . Edema    right ankle swollen x 2-3 months, feet seem to have a blue color to them x few days.     HPI Patient is in today with concerns of blue tint to her feet x 1 week, worsening neuropathic pain in her legs and feet and a bruise behind her left leg that she noticed but does not recall any injury. Patient reports that both ankles swell but the right worse than the left. She is taking Gabapentin for neuropathy that does not seem to help. She gets pedicures routinely to take care of her toenails.Pain is sharp. It is not better/worse with sitting or walking. Hot/cold does not have any effect. She has a history of DM, Hypothyroidism, obesity, Hypertension.  Past Medical History:  Diagnosis Date  . Allergic rhinitis, cause unspecified   . Arthritis   . Bipolar disorder, unspecified ([place])    depression  . Cancer ([place])    skin cancer on left leg  . Chronic diastolic heart failure ([place])   . Depression   . GERD (gastroesophageal reflux disease)   . Headache   . History of gout    "haven't had it in several years" (02/08/2012)  . History of kidney stones   . History of wrist fracture    rt wrist  . Hypertension   . Iron deficiency anemia   . Kidney stones    sees urologist @ [facility]  . Morbid obesity ([place])   . Neuropathy due to secondary diabetes ([place])   . Nontoxic multinodular goiter   . Osteoporosis   . PTSD (post-traumatic stress disorder)   . Pure hypercholesterolemia   . RBBB   . Scleroderma ([place])   . Sleep apnea 7/14   mild osa-did not need cpap -dr Gwenette Greet  . Type II diabetes mellitus ([place])   . Unspecified hypothyroidism     Past Surgical History:  Procedure Laterality Date  . ABDOMINAL HYSTERECTOMY  1976  . BREAST EXCISIONAL BIOPSY Left   . BREAST LUMPECTOMY WITH RADIOACTIVE SEED AND SENTINEL LYMPH NODE BIOPSY  Left 04/25/2018   Procedure: LEFT BREAST LUMPECTOMY WITH RADIOACTIVE SEED AND SENTINEL LYMPH NODE BIOPSY;  Surgeon: Excell Seltzer, MD;  Location: [facility];  Service: General;  Laterality: Left;  . BREAST SURGERY    . CARDIAC CATHETERIZATION  2001   sees Dr Peter Martinique  . CATARACT EXTRACTION  2014  . CATARACT EXTRACTION, BILATERAL  02/2011   epps  . CHOLECYSTECTOMY  1985  . EXCISIONAL HEMORRHOIDECTOMY     "dr cut out in his office" (02/08/2012)  . INCISIONAL BREAST BIOPSY  2000   right  . JOINT REPLACEMENT     rt knee  . KNEE ARTHROSCOPY  08/2009   right  . KNEE ARTHROSCOPY WITH MEDIAL MENISECTOMY     left  . Left Cystoscopy   1990  . LEFT OOPHORECTOMY  1980  . Lithotripsy (L) Kidney  1997  . LUMBAR LAMINECTOMY/DECOMPRESSION MICRODISCECTOMY Left 07/01/2016   Procedure: Laminectomy for synovial cyst - left - Lumbar four-five;  Surgeon: Earnie Larsson, MD;  Location: [facility];  Service: Neurosurgery;  Laterality: Left;  . PARTIAL MASTECTOMY WITH NEEDLE LOCALIZATION Left 09/01/2012   Procedure: PARTIAL MASTECTOMY WITH NEEDLE LOCALIZATION;  Surgeon: Adin Hector, MD;  Location: [facility];  Service: General;  Laterality: Left;  .  Percitania stone removed (L) Kidney  1992  . Right nasal surgery  08/1988  . Right sinus removed  08/1989   tooth partial  . ROTATOR CUFF REPAIR  2013   right shoulder x 2  . SHOULDER ARTHROSCOPY WITH ROTATOR CUFF REPAIR AND SUBACROMIAL DECOMPRESSION Left 08/20/2013   Procedure: SHOULDER ARTHROSCOPY  AND SUBACROMIAL DECOMPRESSION;  Surgeon: Nita Sells, MD;  Location: [facility];  Service: Orthopedics;  Laterality: Left;  Left shoulder arthroscopy, debridement, subacromial decompression, distal clavical resection  . THYROIDECTOMY  04/22/2011   Procedure: THYROIDECTOMY;  Surgeon: Onnie Graham, MD;  Location: [facility];  Service: ENT;  Laterality: N/A;  TOTAL THYROIDECOTMY  . TOTAL KNEE ARTHROPLASTY  06/18/2011   Procedure: TOTAL KNEE ARTHROPLASTY;   Surgeon: Kerin Salen, MD;  Location: [facility];  Service: Orthopedics;  Laterality: Left;  DEPUY  . TOTAL KNEE ARTHROPLASTY  02/07/2012   Procedure: TOTAL KNEE ARTHROPLASTY;  Surgeon: Kerin Salen, MD;  Location: [facility];  Service: Orthopedics;  Laterality: Right;  . TUBAL LIGATION  1972    Family History  Problem Relation Age of Onset  . Hypertension Mother   . Lung cancer Mother        non-smoker  . Diabetes Father   . Hypertension Father   . Hyperlipidemia Father   . Heart attack Other   . Coronary artery disease Other   . Hypertension Sister   . Hypertension Brother   . Hypertension Brother   . Hypertension Brother   . Hypertension Sister   . Hypertension Sister   . Diabetes Daughter     Social History   Socioeconomic History  . Marital status: Married    Spouse name: Not on file  . Number of children: 2  . Years of education: 58  . Highest education level: Not on file  Occupational History  . Occupation: disability    Employer: RETIRED  Tobacco Use  . Smoking status: Former Smoker    Packs/day: 1.00    Years: 6.00    Pack years: 6.00    Types: Cigarettes    Quit date: 04/06/1983    Years since quitting: 36.9  . Smokeless tobacco: Never Used  . Tobacco comment: Married, lives with spouse. Disable- 2 grown kids-6 g-kids  Vaping Use  . Vaping Use: Never used  Substance and Sexual Activity  . Alcohol use: No    Comment: none  . Drug use: No  . Sexual activity: Not Currently  Other Topics Concern  . Not on file  Social History Narrative   Married, lives with spouse - 2 adult children   Disabled -- started around 2000 for mental health reasons, Bipolar Disorder/PTSD   Husband works   Investment banker, operational of Radio broadcast assistant Strain:   . Difficulty of Paying Living Expenses: Not on file  Food Insecurity:   . Worried About Charity fundraiser in the Last Year: Not on file  . Ran Out of Food in the Last Year: Not on file  Transportation Needs:   .  Lack of Transportation (Medical): Not on file  . Lack of Transportation (Non-Medical): Not on file  Physical Activity:   . Days of Exercise per Week: Not on file  . Minutes of Exercise per Session: Not on file  Stress:   . Feeling of Stress : Not on file  Social Connections:   . Frequency of Communication with Friends and Family: Not on file  . Frequency of Social  Gatherings with Friends and Family: Not on file  . Attends Religious Services: Not on file  . Active Member of Clubs or Organizations: Not on file  . Attends Archivist Meetings: Not on file  . Marital Status: Not on file  Intimate Partner Violence:   . Fear of Current or Ex-Partner: Not on file  . Emotionally Abused: Not on file  . Physically Abused: Not on file  . Sexually Abused: Not on file    Outpatient Medications Prior to Visit  Medication Sig Dispense Refill  . allopurinol (ZYLOPRIM) 300 MG tablet Take 1 tablet (300 mg total) by mouth daily. 90 tablet 1  . anastrozole (ARIMIDEX) 1 MG tablet Take 1 tablet (1 mg total) by mouth daily. 90 tablet 1  . aspirin EC 81 MG tablet Take 1 tablet (81 mg total) by mouth daily. Swallow whole. 90 tablet 3  . Biotin 10 MG CAPS Take 10 mg by mouth at bedtime.    Marland Kitchen BLACK ELDERBERRY PO     . Blood Glucose Monitoring Suppl (ACCU-CHEK AVIVA PLUS) w/Device KIT USE TO CHECK BLOOD SUGAR 2 TIMES PER DAY. (Patient taking differently: 1 each by Other route See admin instructions. USE TO CHECK BLOOD SUGAR 2 TIMES PER DAY.) 1 kit 2  . Calcium Carbonate-Vitamin D (CALTRATE 600+D PO) Take 1 capsule by mouth in the morning and at bedtime.    . cetirizine (ZYRTEC) 10 MG tablet Take 20 mg by mouth daily.     . Cholecalciferol (VITAMIN D3 PO) Take 2,000 Units by mouth daily.     Marland Kitchen doxepin (SINEQUAN) 10 MG capsule Take 1 capsule by mouth daily.    . folic acid (FOLVITE) 1 MG tablet Take 1 mg by mouth daily.    . furosemide (LASIX) 40 MG tablet Take 1 tablet (40 mg total) by mouth daily. 90  tablet 3  . GLUCOSAMINE-CHONDROITIN PO Take 1 tablet by mouth 2 (two) times daily.    Marland Kitchen glucose blood (ACCU-CHEK AVIVA PLUS) test strip USE TO CHECK BLOOD SUGAR TWO TIMES PER DAY. (Patient taking differently: 1 each by Other route See admin instructions. USE TO CHECK BLOOD SUGAR TWO TIMES PER DAY.) 200 each 3  . insulin degludec (TRESIBA) 100 UNIT/ML FlexTouch Pen 30 units daily, decrease by 2 units q 3 days until fasting blood sugars are 100-120 range. 9 mL 0  . Insulin Pen Needle (DROPLET PEN NEEDLES) 31G X 5 MM MISC USE TWICE DAILY AS DIRECTED BY PHYSICIAN 100 each 3  . lamoTRIgine (LAMICTAL) 200 MG tablet Take 1 tablet (200 mg total) by mouth daily at 12 noon. 90 tablet 3  . Lancets (ACCU-CHEK MULTICLIX) lancets Use to check blood sugar two time per day. (Patient taking differently: 1 each by Other route in the morning and at bedtime. Use to check blood sugar two time per day.) 200 each 2  . levothyroxine (SYNTHROID, LEVOTHROID) 50 MCG tablet Take 1 tablet (50 mcg total) by mouth daily. --- Office visit needed for further refills (Patient taking differently: Take 50 mcg by mouth daily before breakfast. ) 90 tablet 0  . lisinopril (ZESTRIL) 5 MG tablet Take 1 tablet (5 mg total) by mouth daily. 90 tablet 1  . metFORMIN (GLUCOPHAGE) 500 MG tablet Take 500 mg by mouth 2 (two) times daily with a meal.    . methotrexate 2.5 MG tablet Take 10 mg by mouth once a week.     Marland Kitchen omeprazole (PRILOSEC) 20 MG capsule Take 1 capsule (20 mg  total) by mouth daily. Take 30 minutes before a meal (Patient taking differently: Take 20 mg by mouth daily at 12 noon. Take 30 minutes before a meal) 90 capsule 3  . OVER THE COUNTER MEDICATION Take 1 tablet by mouth daily. Bacopa    . oxybutynin (DITROPAN-XL) 10 MG 24 hr tablet Take 1 tablet (10 mg total) by mouth every evening. (Patient taking differently: Take 10 mg by mouth at bedtime. ) 90 tablet 3  . potassium citrate (UROCIT-K) 10 MEQ (1080 MG) SR tablet     .  pravastatin (PRAVACHOL) 20 MG tablet Take 1 tablet by mouth at bedtime.    . predniSONE (DELTASONE) 5 MG tablet Take 5 mg by mouth daily with breakfast.     . sertraline (ZOLOFT) 100 MG tablet Take 100 mg by mouth daily.     . tamsulosin (FLOMAX) 0.4 MG CAPS capsule Take 0.4 mg by mouth daily.    Marland Kitchen gabapentin (NEURONTIN) 600 MG tablet Take 600-900 mg by mouth See admin instructions. Take 600 mg by mouth three times daily and take 900 mg by mouth at bedtime    . Insulin Syringe-Needle U-100 (INSULIN SYRINGE 1CC/31GX5/16") 31G X 5/16" 1 ML MISC Use to inject insulin two times per day. (Patient taking differently: 1 each by Other route See admin instructions. Use to inject insulin two times per day.) 200 each 2   No facility-administered medications prior to visit.    No Known Allergies  Review of Systems  Constitutional: Negative.   Respiratory: Negative.   Cardiovascular: Negative.   Gastrointestinal: Negative.   Endocrine: Negative.  Negative for cold intolerance.  Genitourinary: Negative.   Musculoskeletal: Positive for arthralgias.  Skin: Positive for color change.       Blue color to the feet   Allergic/Immunologic: Negative.   Neurological: Negative for weakness and numbness.       Neuropathic pain to the feet  Hematological: Negative.   Psychiatric/Behavioral: Negative.        Objective:    Physical Exam Constitutional:      Appearance: Normal appearance. She is obese.  Cardiovascular:     Rate and Rhythm: Normal rate and regular rhythm.  Pulmonary:     Effort: Pulmonary effort is normal.     Breath sounds: Normal breath sounds.  Abdominal:     General: Bowel sounds are normal.     Palpations: Abdomen is soft.  Musculoskeletal:        General: Tenderness present. Normal range of motion.     Cervical back: Normal range of motion and neck supple.     Right lower leg: Edema present.     Left lower leg: Edema present.     Comments: 1+ pitting edema bilaterally   Skin:    General: Skin is warm and dry.     Capillary Refill: Capillary refill takes less than 2 seconds.     Findings: Bruising present. No erythema.     Comments: Bruise noted behind the left knee. Peripheral cyanosis noted to the feet bilaterally-mild.   Neurological:     General: No focal deficit present.     Mental Status: She is alert and oriented to person, place, and time.     Motor: No weakness.     Coordination: Coordination normal.     Gait: Gait normal.     Comments: monofilatment normal  Psychiatric:        Mood and Affect: Mood normal.        Behavior: Behavior  normal.     BP (!) 162/72   Pulse (!) 58   Temp (!) 97 F (36.1 C) (Tympanic)   Ht '5\' 3"'  (1.6 m)   Wt 256 lb 6.4 oz (116.3 kg)   SpO2 100%   BMI 45.42 kg/m  Wt Readings from Last 3 Encounters:  02/27/20 256 lb 6.4 oz (116.3 kg)  02/08/20 255 lb 4 oz (115.8 kg)  01/25/20 251 lb 12.8 oz (114.2 kg)    Health Maintenance Due  Topic Date Due  . OPHTHALMOLOGY EXAM  11/01/2017  . PNA vac Low Risk Adult (2 of 2 - PPSV23) 08/09/2018  . INFLUENZA VACCINE  11/04/2019    There are no preventive care reminders to display for this patient.   Lab Results  Component Value Date   TSH 2.81 09/09/2016   Lab Results  Component Value Date   WBC 9.7 09/14/2019   HGB 10.8 (L) 09/14/2019   HCT 34.9 (L) 09/14/2019   MCV 87.7 09/14/2019   PLT 460 (H) 09/14/2019   Lab Results  Component Value Date   NA 139 09/14/2019   K 4.3 09/14/2019   CO2 29 09/14/2019   GLUCOSE 96 09/14/2019   BUN 10 09/14/2019   CREATININE 0.84 09/14/2019   BILITOT 0.4 09/14/2019   ALKPHOS 102 09/14/2019   AST 23 09/14/2019   ALT 18 09/14/2019   PROT 6.6 09/14/2019   ALBUMIN 3.4 (L) 09/14/2019   CALCIUM 9.3 09/14/2019   ANIONGAP 9 09/14/2019   GFR 67.43 09/09/2016   Lab Results  Component Value Date   CHOL 168 01/25/2020   Lab Results  Component Value Date   HDL 59 01/25/2020   Lab Results  Component Value Date    LDLCALC 92 01/25/2020   Lab Results  Component Value Date   TRIG 95 01/25/2020   Lab Results  Component Value Date   CHOLHDL 2.8 01/25/2020   Lab Results  Component Value Date   HGBA1C 6.6 (H) 02/08/2020       Assessment & Plan:   Problem List Items Addressed This Visit    Hypothyroidism, postsurgical   Relevant Orders   TSH   Diabetic neuropathy, painful ([place])   Relevant Orders   TSH   CBC   Diabetes mellitus type 2 in obese ([facility])   Relevant Orders   TSH   CBC    Other Visit Diagnoses    Peripheral cyanosis    -  Primary   Relevant Orders   TSH   CBC   Bruising       Relevant Orders   CBC       Meds ordered this encounter  Medications  . pregabalin (LYRICA) 50 MG capsule    Sig: Take 1 capsule (50 mg total) by mouth 2 (two) times daily.    Dispense:  60 capsule    Refill:  1   Recheck in 1 month.   Kennyth Arnold, FNP

## 2020-02-27 NOTE — Telephone Encounter (Signed)
Nurse Assessment Nurse: Clydene Laming, RN, Cassandra Date/Time (Eastern Time): 02/26/2020 5:00:03 PM Confirm and document reason for call. If symptomatic, describe symptoms. ---Tops of feet are turning blue, ankles are swollen, feet don't feel cold Does the patient have any new or worsening symptoms? ---Yes Will a triage be completed? ---Yes Related visit to physician within the last 2 weeks? ---No Does the PT have any chronic conditions? (i.e. diabetes, asthma, this includes High risk factors for pregnancy, etc.) ---Yes List chronic conditions. ---DM neuropathy Hypertension Thyroid Breast CA history Edema Is this a behavioral health or substance abuse call? ---No Guidelines Guideline Title Affirmed Question Affirmed Notes Nurse Date/Time Eilene Ghazi Time) Leg Swelling and Edema [1] MODERATE leg swelling (e.g., swelling extends up to knees) AND [2] new-onset or worsening Clydene Laming, RN, Cassandra 02/26/2020 5:05:29 PM Disp. Time Eilene Ghazi Time) Disposition Final User 02/26/2020 5:07:05 PM See PCP within 24 Hours Yes Clydene Laming, RN, Cassandra Caller Disagree/Comply Comply PLEASE NOTE: All timestamps contained within this report are represented as [place] Standard Time. CONFIDENTIALTY NOTICE: This fax transmission is intended only for the addressee. It contains information that is legally privileged, confidential or otherwise protected from use or disclosure. If you are not the intended recipient, you are strictly prohibited from reviewing, disclosing, copying using or disseminating any of this information or taking any action in reliance on or regarding this information. If you have received this fax in error, please notify us immediately by telephone so that we can arrange for its return to [place]. Phone: [phone], Toll-Free: [phone], Fax: [phone] Page: 2 of 2 Call Id: [id number] [place] Understands Yes PreDisposition Call Doctor Care Advice Given Per Guideline SEE PCP WITHIN 24 HOURS: * IF OFFICE  WILL BE OPEN: You need to be examined within the next 24 hours. Call your doctor (or NP/PA) when the office opens and make an appointment. * Use nurse judgment to select the most appropriate source of care. LEG SWELLING OR EDEMA: CALL BACK IF: * Breathing difficulty or chest pain occurs * You become worse CARE ADVICE given per Leg Swelling and Edema (Adult) guideline. Referrals REFERRED TO PCP OFFICE

## 2020-03-19 ENCOUNTER — Other Ambulatory Visit: Payer: Self-pay | Admitting: *Deleted

## 2020-03-19 MED ORDER — DROPLET PEN NEEDLES 31G X 5 MM MISC
3 refills | Status: DC
Start: 2020-03-19 — End: 2023-12-09

## 2020-03-19 MED ORDER — INSULIN DEGLUDEC 100 UNIT/ML ~~LOC~~ SOPN: PEN_INJECTOR | SUBCUTANEOUS | 0 refills | Status: DC

## 2020-03-26 ENCOUNTER — Ambulatory Visit
Admission: RE | Admit: 2020-03-26 | Discharge: 2020-03-26 | Disposition: A | Discharge status: Home / Self Care | Payer: Medicare HMO | Source: Ambulatory Visit | Attending: Adult Health | Admitting: Adult Health

## 2020-03-26 ENCOUNTER — Other Ambulatory Visit: Payer: Self-pay

## 2020-03-26 DIAGNOSIS — Z9889 Other specified postprocedural states: Secondary | ICD-10-CM

## 2020-04-02 ENCOUNTER — Other Ambulatory Visit: Payer: Self-pay

## 2020-04-02 ENCOUNTER — Ambulatory Visit: Payer: Medicare HMO | Admitting: Neurology

## 2020-04-02 ENCOUNTER — Other Ambulatory Visit: Payer: Self-pay | Admitting: Hematology and Oncology

## 2020-04-02 ENCOUNTER — Other Ambulatory Visit: Payer: Self-pay | Admitting: Physician Assistant

## 2020-04-02 DIAGNOSIS — G5602 Carpal tunnel syndrome, left upper limb: Secondary | ICD-10-CM

## 2020-04-02 DIAGNOSIS — R202 Paresthesia of skin: Secondary | ICD-10-CM

## 2020-04-02 NOTE — Procedures (Signed)
[facility]  [address]  [place], [place] [address] Tel: [phone] Fax:  [phone] Test Date:  04/02/2020  Patient: Brittany Acosta DOB: [birth year] Physician: Narda Amber, DO  Sex: Female Height: 5\' 3"  Ref Phys: Teresa Coombs, DO  ID#: [id number]   Technician:    Patient Complaints: This is a 69 year old female referred for evaluation of bilateral hand paresthesias.  NCV & EMG Findings: Extensive electrodiagnostic testing of the left upper extremity and additional studies of the right shows:  1. Left median sensory response shows prolonged latency (3.9 ms) and reduced amplitude (7.8 V).  Right median, right mixed palmar, and bilateral ulnar sensory responses are within normal limits. 2. Left median motor response shows prolonged latency (4.1 ms).  Right median and bilateral ulnar sensory responses are within normal limits. 3. There is no evidence of active or chronic motor axonal loss changes affecting any of the tested muscles.  Motor unit configuration and recruitment pattern is within normal limits.  Impression: 1. Left median neuropathy at or distal to the wrist, consistent with a clinical diagnosis of carpal tunnel syndrome.  Overall, these findings are moderate in degree electrically. 2. There is no evidence of right carpal tunnel syndrome or cervical radiculopathy affecting either upper extremity.   ___________________________ Narda Amber, DO    Nerve Conduction Studies Anti Sensory Summary Table   Stim Site NR Peak (ms) Norm Peak (ms) P-T Amp (V) Norm P-T Amp  Left Median Anti Sensory (2nd Digit)  33C  Wrist    3.9 <3.8 7.8 >10  Right Median Anti Sensory (2nd Digit)  33C  Wrist    3.3 <3.8 12.9 >10  Left Ulnar Anti Sensory (5th Digit)  33C  Wrist    2.8 <3.2 12.2 >5  Right Ulnar Anti Sensory (5th Digit)  33C  Wrist    2.8 <3.2 11.7 >5   Motor Summary Table   Stim Site NR Onset (ms) Norm Onset (ms) O-P Amp (mV) Norm O-P Amp Site1  Site2 Delta-0 (ms) Dist (cm) Vel (m/s) Norm Vel (m/s)  Left Median Motor (Abd Poll Brev)  33C  Wrist    4.1 <4.0 6.9 >5 Elbow Wrist 4.7 27.0 57 >50  Elbow    8.8  6.2         Right Median Motor (Abd Poll Brev)  33C  Wrist    3.1 <4.0 6.4 >5 Elbow Wrist 5.1 26.0 51 >50  Elbow    8.2  6.1         Left Ulnar Motor (Abd Dig Minimi)  33C  Wrist    2.5 <3.1 9.8 >7 B Elbow Wrist 3.4 21.0 62 >50  B Elbow    5.9  9.8  A Elbow B Elbow 1.6 10.0 63 >50  A Elbow    7.5  9.5         Right Ulnar Motor (Abd Dig Minimi)  33C  Wrist    2.4 <3.1 7.7 >7 B Elbow Wrist 3.8 22.0 58 >50  B Elbow    6.2  7.3  A Elbow B Elbow 1.8 10.0 56 >50  A Elbow    8.0  6.9          Comparison Summary Table   Stim Site NR Peak (ms) Norm Peak (ms) P-T Amp (V) Site1 Site2 Delta-P (ms) Norm Delta (ms)  Right Median/Ulnar Palm Comparison (Wrist - 8cm)  33C  Median Palm    2.1 <2.2 36.9 Median Palm Ulnar Palm 0.3  Ulnar Palm    1.8 <2.2 6.7       EMG   Side Muscle Ins Act Fibs Psw Fasc Number Recrt Dur Dur. Amp Amp. Poly Poly. Comment  Right 1stDorInt Nml Nml Nml Nml Nml Nml Nml Nml Nml Nml Nml Nml N/A  Right PronatorTeres Nml Nml Nml Nml Nml Nml Nml Nml Nml Nml Nml Nml N/A  Right Biceps Nml Nml Nml Nml Nml Nml Nml Nml Nml Nml Nml Nml N/A  Right Triceps Nml Nml Nml Nml Nml Nml Nml Nml Nml Nml Nml Nml N/A  Right Deltoid Nml Nml Nml Nml Nml Nml Nml Nml Nml Nml Nml Nml N/A  Left 1stDorInt Nml Nml Nml Nml Nml Nml Nml Nml Nml Nml Nml Nml N/A  Left Abd Poll Brev Nml Nml Nml Nml Nml Nml Nml Nml Nml Nml Nml Nml N/A  Left PronatorTeres Nml Nml Nml Nml Nml Nml Nml Nml Nml Nml Nml Nml N/A  Left Biceps Nml Nml Nml Nml Nml Nml Nml Nml Nml Nml Nml Nml N/A  Left Triceps Nml Nml Nml Nml Nml Nml Nml Nml Nml Nml Nml Nml N/A  Left Deltoid Nml Nml Nml Nml Nml Nml Nml Nml Nml Nml Nml Nml N/A      Waveforms:

## 2020-04-15 DIAGNOSIS — S0285XA Fracture of orbit, unspecified, initial encounter for closed fracture: Secondary | ICD-10-CM | POA: Insufficient documentation

## 2020-04-23 ENCOUNTER — Encounter: Payer: Self-pay | Admitting: Physician Assistant

## 2020-04-24 ENCOUNTER — Other Ambulatory Visit: Payer: Medicare HMO

## 2020-04-24 DIAGNOSIS — Z20822 Contact with and (suspected) exposure to covid-19: Secondary | ICD-10-CM

## 2020-04-26 LAB — SARS-COV-2, NAA 2 DAY TAT

## 2020-04-26 LAB — NOVEL CORONAVIRUS, NAA: SARS-CoV-2, NAA: DETECTED — AB

## 2020-04-27 ENCOUNTER — Telehealth: Payer: Self-pay | Admitting: Infectious Diseases

## 2020-04-27 NOTE — Telephone Encounter (Signed)
Called to discuss with patient about COVID-19 symptoms and the use of one of the available treatments for those with mild to moderate Covid symptoms and at a high risk of hospitalization.  Pt appears to qualify for outpatient treatment due to co-morbid conditions and/or a member of an at-risk group in accordance with the FDA Emergency Use Authorization.    Symptom onset: 04/23/2020 - headaches, congestion  Vaccinated: Yes completed April 2021 Booster? No  Immunocompromised? no Qualifiers: BMI, Age   She is feeling better and politely declined.    Janene Madeira, NP

## 2020-05-05 ENCOUNTER — Other Ambulatory Visit: Payer: Self-pay

## 2020-05-05 ENCOUNTER — Telehealth: Payer: Self-pay

## 2020-05-05 MED ORDER — PREGABALIN 50 MG PO CAPS: 50.0000 mg | ORAL_CAPSULE | Freq: Two times a day (BID) | ORAL | 2 refills | Status: DC

## 2020-05-05 NOTE — Telephone Encounter (Signed)
  LAST APPOINTMENT DATE: 02/27/2020   NEXT APPOINTMENT DATE:@Visit  date not found  MEDICATION:pregabalin (LYRICA) 50 MG capsule  PHARMACY: [place], [place]  COMMENTS: Patient is completely out.   Please advise

## 2020-05-05 NOTE — Telephone Encounter (Signed)
Patient is calling in stating she has been exposed to her husband who is COVID positive, she would like to know how long she should quarantine for.

## 2020-05-05 NOTE — Telephone Encounter (Signed)
Left message on voicemail to call office.  

## 2020-05-05 NOTE — Telephone Encounter (Signed)
Pt requesting refill on Lyrica. Last OV 04/10/2019.

## 2020-05-05 NOTE — Telephone Encounter (Signed)
Spoke to pt, she wanted to know how long to quarantine since husband is positive today Told pt since she just had COVID and quarantined for her 5 days already. She needs to continue to wear a mask around others for 10 days and to continue to monitor her symptoms. Told pt she does not need to be tested again per [facility]. Pt verbalized understanding.

## 2020-05-07 ENCOUNTER — Encounter: Payer: Self-pay | Admitting: Physician Assistant

## 2020-05-07 ENCOUNTER — Other Ambulatory Visit: Payer: Self-pay

## 2020-05-07 ENCOUNTER — Telehealth (INDEPENDENT_AMBULATORY_CARE_PROVIDER_SITE_OTHER): Payer: Medicare HMO | Admitting: Physician Assistant

## 2020-05-07 DIAGNOSIS — M069 Rheumatoid arthritis, unspecified: Secondary | ICD-10-CM | POA: Insufficient documentation

## 2020-05-07 DIAGNOSIS — J019 Acute sinusitis, unspecified: Secondary | ICD-10-CM

## 2020-05-07 DIAGNOSIS — M064 Inflammatory polyarthropathy: Secondary | ICD-10-CM | POA: Insufficient documentation

## 2020-05-07 DIAGNOSIS — H9203 Otalgia, bilateral: Secondary | ICD-10-CM

## 2020-05-07 MED ORDER — AMOXICILLIN-POT CLAVULANATE 875-125 MG PO TABS: 1.0000 | ORAL_TABLET | Freq: Two times a day (BID) | ORAL | 0 refills | Status: DC

## 2020-05-07 MED ORDER — FLUTICASONE PROPIONATE 50 MCG/ACT NA SUSP: 2.0000 | Freq: Every day | NASAL | 0 refills | Status: DC

## 2020-05-07 NOTE — Progress Notes (Signed)
Virtual Visit via Telephone Note  I connected with Brittany Acosta on 05/07/20 at  4:00 PM EST by telephone and verified that I am speaking with the correct person using two identifiers.  Location: Patient: home Provider: office   I discussed the limitations, risks, security and privacy concerns of performing an evaluation and management service by telephone and the availability of in person appointments. I also discussed with the patient that there may be a patient responsible charge related to this service. The patient expressed understanding and agreed to proceed.   History of Present Illness:  70 yo female presents today for sinus issues worsening x 3 days. Having sinus pressure, sinus pain, postnasal drainage, chills, bilateral ear pain. Affecting sleep. Taking Mucinex at home. Feels like symptoms are worsening. She took an at-home COVID test yesterday and it was negative; however, she did test positive on 04/24/20. Husband is currently in the hospital the last 4 days with COVID-19, diagnosed about two weeks ago. Denies any chest pain or shortness of breath.    Observations/Objective:  SpO2 98% HR is 75 bpm Speaking clearly without pauses or gasps.   Assessment and Plan:  Pt has a hx of sinusitis recurrence. Given her severe bilateral ear pain and this history, will start on Augmentin at this time. She should also use nasal saline and Flonase. Push fluids. Call if worsening or no improvement.  Follow Up Instructions:    I discussed the assessment and treatment plan with the patient. The patient was provided an opportunity to ask questions and all were answered. The patient agreed with the plan and demonstrated an understanding of the instructions.   The patient was advised to call back or seek an in-person evaluation if the symptoms worsen or if the condition fails to improve as anticipated.  I provided 12 minutes of non-face-to-face time during this encounter.   Cedra Villalon M  Rindi Beechy, PA-C

## 2020-05-07 NOTE — Progress Notes (Signed)
I have discussed the procedure for the virtual visit with the patient who has given consent to proceed with assessment and treatment.   Lynea Rollison S Sadie Pickar, CMA     

## 2020-05-07 NOTE — Patient Instructions (Signed)
Please take the medications as directed. Call if worse or no improvement of symptoms.

## 2020-05-16 ENCOUNTER — Telehealth: Payer: Self-pay

## 2020-05-16 NOTE — Telephone Encounter (Signed)
Pt called stating she finished her amoxicillin today from when she saw you on 05/07/2020. Pt states her symptoms have not improved much and she is not feeling better. Pt asked if there was anything else you can send in for her? Please advise.

## 2020-05-16 NOTE — Telephone Encounter (Signed)
Scheduled in office visit for pt to see PCP on Monday

## 2020-05-16 NOTE — Telephone Encounter (Signed)
She will need to be seen in office, please.

## 2020-05-19 ENCOUNTER — Other Ambulatory Visit: Payer: Self-pay

## 2020-05-19 ENCOUNTER — Encounter: Payer: Self-pay | Admitting: Physician Assistant

## 2020-05-19 ENCOUNTER — Ambulatory Visit (INDEPENDENT_AMBULATORY_CARE_PROVIDER_SITE_OTHER): Payer: Medicare HMO | Admitting: Physician Assistant

## 2020-05-19 VITALS — BP 130/70 | HR 68 | Temp 97.7°F | Ht 63.0 in | Wt 245.4 lb

## 2020-05-19 DIAGNOSIS — R0981 Nasal congestion: Secondary | ICD-10-CM | POA: Diagnosis not present

## 2020-05-19 DIAGNOSIS — H6983 Other specified disorders of Eustachian tube, bilateral: Secondary | ICD-10-CM

## 2020-05-19 DIAGNOSIS — B379 Candidiasis, unspecified: Secondary | ICD-10-CM | POA: Diagnosis not present

## 2020-05-19 MED ORDER — PREDNISONE 20 MG PO TABS: 20.0000 mg | ORAL_TABLET | Freq: Every day | ORAL | 0 refills | Status: DC

## 2020-05-19 MED ORDER — FLUCONAZOLE 150 MG PO TABS: 150.0000 mg | ORAL_TABLET | Freq: Once | ORAL | 0 refills | Status: AC

## 2020-05-19 NOTE — Progress Notes (Signed)
Brittany Acosta is a 70 y.o. female is here for follow up.  I acted as a Education administrator for [employer], PA-C Anselmo Pickler, LPN   History of Present Illness:   Chief Complaint  Patient presents with  . Sinus Problem  . Otalgia    HPI  Sinus problem; Otalgia Pt c/o sinus headache, pressure and pain x 1 month. Pt is having yellow nasal drainage. Denies fever or chills. Pt was treated on 2/2 with Augmentin, finished the course did not help. Pt has been taking BC sinus for congestion and headache with some relief. She reports that she was around fresh cut flowers over the weekend and this can trigger her allergies. She takes generic zyrtec regularly and overall has tolerated this well.  Pt c/o bilateral ear pain off and on x 1 month or so. Pt has not tried anything. Denies: fevers, chills, cough. Uses a saline nasal rinse with some relief.  Abx-Induced Yeast Infection Has had some recent vaginal itching and suspects possible yeast infection. She has not tried anything for her symptoms. Denies vaginal discharge or bleeding.  Health Maintenance Due  Topic Date Due  . OPHTHALMOLOGY EXAM  11/01/2017  . PNA vac Low Risk Adult (2 of 2 - PPSV23) 08/09/2018  . INFLUENZA VACCINE  11/04/2019    Past Medical History:  Diagnosis Date  . Allergic rhinitis, cause unspecified   . Arthritis   . Bipolar disorder, unspecified ([place])    depression  . Cancer ([place])    skin cancer on left leg  . Chronic diastolic heart failure ([place])   . Depression   . GERD (gastroesophageal reflux disease)   . Headache   . History of gout    "haven't had it in several years" (02/08/2012)  . History of kidney stones   . History of wrist fracture    rt wrist  . Hypertension   . Iron deficiency anemia   . Kidney stones    sees urologist @ [facility]  . Morbid obesity ([place])   . Neuropathy due to secondary diabetes ([place])   . Nontoxic multinodular goiter   . Osteoporosis   . PTSD (post-traumatic stress disorder)   .  Pure hypercholesterolemia   . RBBB   . Scleroderma ([place])   . Sleep apnea 7/14   mild osa-did not need cpap -dr Gwenette Greet  . Type II diabetes mellitus ([place])   . Unspecified hypothyroidism      Social History   Tobacco Use  . Smoking status: Former Smoker    Packs/day: 1.00    Years: 6.00    Pack years: 6.00    Types: Cigarettes    Quit date: 04/06/1983    Years since quitting: 37.1  . Smokeless tobacco: Never Used  . Tobacco comment: Married, lives with spouse. Disable- 2 grown kids-6 g-kids  Vaping Use  . Vaping Use: Never used  Substance Use Topics  . Alcohol use: No    Comment: none  . Drug use: No    Past Surgical History:  Procedure Laterality Date  . ABDOMINAL HYSTERECTOMY  1976  . BREAST EXCISIONAL BIOPSY Left   . BREAST LUMPECTOMY WITH RADIOACTIVE SEED AND SENTINEL LYMPH NODE BIOPSY Left 04/25/2018   Procedure: LEFT BREAST LUMPECTOMY WITH RADIOACTIVE SEED AND SENTINEL LYMPH NODE BIOPSY;  Surgeon: Excell Seltzer, MD;  Location: [facility];  Service: General;  Laterality: Left;  . BREAST SURGERY    . CARDIAC CATHETERIZATION  2001   sees Dr Peter Martinique  . CATARACT  EXTRACTION  2014  . CATARACT EXTRACTION, BILATERAL  02/2011   epps  . CHOLECYSTECTOMY  1985  . EXCISIONAL HEMORRHOIDECTOMY     "dr cut out in his office" (02/08/2012)  . INCISIONAL BREAST BIOPSY  2000   right  . JOINT REPLACEMENT     rt knee  . KNEE ARTHROSCOPY  08/2009   right  . KNEE ARTHROSCOPY WITH MEDIAL MENISECTOMY     left  . Left Cystoscopy   1990  . LEFT OOPHORECTOMY  1980  . Lithotripsy (L) Kidney  1997  . LUMBAR LAMINECTOMY/DECOMPRESSION MICRODISCECTOMY Left 07/01/2016   Procedure: Laminectomy for synovial cyst - left - Lumbar four-five;  Surgeon: Earnie Larsson, MD;  Location: [facility];  Service: Neurosurgery;  Laterality: Left;  . PARTIAL MASTECTOMY WITH NEEDLE LOCALIZATION Left 09/01/2012   Procedure: PARTIAL MASTECTOMY WITH NEEDLE LOCALIZATION;  Surgeon: Adin Hector, MD;  Location: [facility];   Service: General;  Laterality: Left;  . Percitania stone removed (L) Kidney  1992  . Right nasal surgery  08/1988  . Right sinus removed  08/1989   tooth partial  . ROTATOR CUFF REPAIR  2013   right shoulder x 2  . SHOULDER ARTHROSCOPY WITH ROTATOR CUFF REPAIR AND SUBACROMIAL DECOMPRESSION Left 08/20/2013   Procedure: SHOULDER ARTHROSCOPY  AND SUBACROMIAL DECOMPRESSION;  Surgeon: Nita Sells, MD;  Location: [facility];  Service: Orthopedics;  Laterality: Left;  Left shoulder arthroscopy, debridement, subacromial decompression, distal clavical resection  . THYROIDECTOMY  04/22/2011   Procedure: THYROIDECTOMY;  Surgeon: Onnie Graham, MD;  Location: [place];  Service: ENT;  Laterality: N/A;  TOTAL THYROIDECOTMY  . TOTAL KNEE ARTHROPLASTY  06/18/2011   Procedure: TOTAL KNEE ARTHROPLASTY;  Surgeon: Kerin Salen, MD;  Location: [facility];  Service: Orthopedics;  Laterality: Left;  DEPUY  . TOTAL KNEE ARTHROPLASTY  02/07/2012   Procedure: TOTAL KNEE ARTHROPLASTY;  Surgeon: Kerin Salen, MD;  Location: [facility];  Service: Orthopedics;  Laterality: Right;  . TUBAL LIGATION  1972    Family History  Problem Relation Age of Onset  . Hypertension Mother   . Lung cancer Mother        non-smoker  . Diabetes Father   . Hypertension Father   . Hyperlipidemia Father   . Heart attack Other   . Coronary artery disease Other   . Hypertension Sister   . Hypertension Brother   . Hypertension Brother   . Hypertension Brother   . Hypertension Sister   . Hypertension Sister   . Diabetes Daughter     PMHx, SurgHx, SocialHx, FamHx, Medications, and Allergies were reviewed in the Visit Navigator and updated as appropriate.   Patient Active Problem List   Diagnosis Date Noted  . Rheumatoid arthritis ([place]) 05/07/2020  . 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 Orbit fracture, left, closed, initial encounter ([place]) 04/15/2020  . Bilateral leg edema 01/25/2020  . Family history of  heart disease 01/25/2020  . Essential hypertension 01/25/2020  . Diabetes mellitus type 2 in obese ([place]) 01/25/2020  . Right carotid bruit 12/25/2018  . Stress incontinence in female 12/06/2018  . Antalgic gait 12/02/2018  . Malignant neoplasm of upper-outer quadrant of left breast in female, estrogen receptor positive ([place]) 05/10/2018  . Temporomandibular joint (TMJ) pain 03/23/2018  . Tophus of toe concurrent with and due to gout 12/16/2017  . Mass in the abdomen 03/17/2017  . Degenerative lumbar spinal stenosis 02/10/2017  . Spondylolisthesis at L4-L5 level 02/10/2017  . Cervical radiculopathy  01/18/2017  . Nausea 12/23/2016  . Depression, major, recurrent, moderate ([place]) 11/19/2016  . Diabetic neuropathy, painful ([place]) 11/19/2016  . Hypothyroidism, 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/19/2016  . OSA (obstructive sleep apnea) 11/19/2016  . PTSD (post-traumatic stress disorder) 11/19/2016  . 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 Poorly controlled type 2 diabetes mellitus with circulatory disorder ([place]) 06/10/2015  . Right bundle branch block 04/18/2015  . Bifascicular block 04/18/2015  . Radiculopathy, lumbar region 05/31/2014  . Greater trochanteric bursitis of left hip 04/22/2014  . Achilles tendinosis 02/19/2014  . Gait instability 11/14/2013  . Recurrent falls 11/14/2013  . Multiple fractures 11/14/2013  . Calculus of kidney 07/25/2013  . Hypercalciuria 07/25/2013  . Pain in joint involving ankle and foot 07/02/2013  . Migraine 07/10/2012  . Osteoarthritis of right knee 02/10/2012  . Osteoarthritis of left knee 06/21/2011  . GERD (gastroesophageal reflux disease) 02/09/2011  . Vitamin D deficiency 08/27/2010  . Fibromyalgia 03/23/2010  . Goiter 03/24/2009  . Edema 09/06/2008  . Morbid (severe) obesity due to excess calories ([place]) 09/03/2008  . Hypercholesterolemia 06/10/2008  . Bipolar 1 disorder ([place]) 06/10/2008  . Hypertension associated with diabetes ([place]) 06/10/2008  . Allergic rhinitis  06/10/2008    Social History   Tobacco Use  . Smoking status: Former Smoker    Packs/day: 1.00    Years: 6.00    Pack years: 6.00    Types: Cigarettes    Quit date: 04/06/1983    Years since quitting: 37.1  . Smokeless tobacco: Never Used  . Tobacco comment: Married, lives with spouse. Disable- 2 grown kids-6 g-kids  Vaping Use  . Vaping Use: Never used  Substance Use Topics  . Alcohol use: No    Comment: none  . Drug use: No    Current Medications and Allergies:    Current Outpatient Medications:  .  allopurinol (ZYLOPRIM) 300 MG tablet, Take 1 tablet (300 mg total) by mouth daily., Disp: 90 tablet, Rfl: 1 .  anastrozole (ARIMIDEX) 1 MG tablet, TAKE 1 TABLET EVERY DAY, Disp: 30 tablet, Rfl: 0 .  aspirin EC 81 MG tablet, Take 1 tablet (81 mg total) by mouth daily. Swallow whole., Disp: 90 tablet, Rfl: 3 .  Biotin 10 MG CAPS, Take 10 mg by mouth at bedtime., Disp: , Rfl:  .  BLACK ELDERBERRY PO, , Disp: , Rfl:  .  Blood Glucose Monitoring Suppl (ACCU-CHEK AVIVA PLUS) w/Device KIT, USE TO CHECK BLOOD SUGAR 2 TIMES PER DAY. (Patient taking differently: 1 each by Other route See admin instructions. USE TO CHECK BLOOD SUGAR 2 TIMES PER DAY.), Disp: 1 kit, Rfl: 2 .  Calcium Carbonate-Vitamin D (CALTRATE 600+D PO), Take 1 capsule by mouth in the morning and at bedtime., Disp: , Rfl:  .  cetirizine (ZYRTEC) 10 MG tablet, Take 20 mg by mouth daily. , Disp: , Rfl:  .  Cholecalciferol (VITAMIN D3 PO), Take 2,000 Units by mouth daily. , Disp: , Rfl:  .  doxepin (SINEQUAN) 10 MG capsule, Take 1 capsule by mouth daily., Disp: , Rfl:  .  fluconazole (DIFLUCAN) 150 MG tablet, Take 1 tablet (150 mg total) by mouth once for 1 dose., Disp: 1 tablet, Rfl: 0 .  fluticasone (FLONASE) 50 MCG/ACT nasal spray, Place 2 sprays into both nostrils daily., Disp: 15 mL, Rfl: 0 .  folic acid (FOLVITE) 1 MG tablet, Take 1 mg by mouth daily., Disp: , Rfl:  .  furosemide (LASIX) 40 MG tablet, Take 1 tablet (40 mg  total) by mouth daily.,  Disp: 90 tablet, Rfl: 3 .  GLUCOSAMINE-CHONDROITIN PO, Take 1 tablet by mouth 2 (two) times daily., Disp: , Rfl:  .  glucose blood (ACCU-CHEK AVIVA PLUS) test strip, USE TO CHECK BLOOD SUGAR TWO TIMES PER DAY. (Patient taking differently: 1 each by Other route See admin instructions. USE TO CHECK BLOOD SUGAR TWO TIMES PER DAY.), Disp: 200 each, Rfl: 3 .  Insulin Pen Needle (DROPLET PEN NEEDLES) 31G X 5 MM MISC, USE TO INJECT INSULIN DAILY, Disp: 100 each, Rfl: 3 .  Insulin Syringe-Needle U-100 (INSULIN SYRINGE 1CC/31GX5/16") 31G X 5/16" 1 ML MISC, Use to inject insulin two times per day. (Patient taking differently: 1 each by Other route See admin instructions. Use to inject insulin two times per day.), Disp: 200 each, Rfl: 2 .  lamoTRIgine (LAMICTAL) 200 MG tablet, Take 1 tablet (200 mg total) by mouth daily at 12 noon., Disp: 90 tablet, Rfl: 3 .  Lancets (ACCU-CHEK MULTICLIX) lancets, Use to check blood sugar two time per day. (Patient taking differently: 1 each by Other route in the morning and at bedtime. Use to check blood sugar two time per day.), Disp: 200 each, Rfl: 2 .  levothyroxine (SYNTHROID, LEVOTHROID) 50 MCG tablet, Take 1 tablet (50 mcg total) by mouth daily. --- Office visit needed for further refills (Patient taking differently: Take 50 mcg by mouth daily before breakfast.), Disp: 90 tablet, Rfl: 0 .  lisinopril (ZESTRIL) 5 MG tablet, Take 1 tablet (5 mg total) by mouth daily., Disp: 90 tablet, Rfl: 1 .  metFORMIN (GLUCOPHAGE) 500 MG tablet, Take 500 mg by mouth 2 (two) times daily with a meal., Disp: , Rfl:  .  methotrexate 2.5 MG tablet, Take 10 mg by mouth once a week. , Disp: , Rfl:  .  omeprazole (PRILOSEC) 20 MG capsule, Take 1 capsule (20 mg total) by mouth daily. Take 30 minutes before a meal (Patient taking differently: Take 20 mg by mouth daily at 12 noon. Take 30 minutes before a meal), Disp: 90 capsule, Rfl: 3 .  OVER THE COUNTER MEDICATION, Take 1  tablet by mouth daily. Bacopa, Disp: , Rfl:  .  oxybutynin (DITROPAN-XL) 10 MG 24 hr tablet, Take 1 tablet (10 mg total) by mouth every evening. (Patient taking differently: Take 10 mg by mouth at bedtime.), Disp: 90 tablet, Rfl: 3 .  potassium citrate (UROCIT-K) 10 MEQ (1080 MG) SR tablet, , Disp: , Rfl:  .  pravastatin (PRAVACHOL) 20 MG tablet, Take 1 tablet by mouth at bedtime., Disp: , Rfl:  .  predniSONE (DELTASONE) 20 MG tablet, Take 1 tablet (20 mg total) by mouth daily with breakfast., Disp: 5 tablet, Rfl: 0 .  predniSONE (DELTASONE) 5 MG tablet, Take 5 mg by mouth daily with breakfast. , Disp: , Rfl:  .  pregabalin (LYRICA) 50 MG capsule, Take 1 capsule (50 mg total) by mouth 2 (two) times daily., Disp: 60 capsule, Rfl: 2 .  sertraline (ZOLOFT) 100 MG tablet, Take 100 mg by mouth daily. , Disp: , Rfl:  .  tamsulosin (FLOMAX) 0.4 MG CAPS capsule, Take 0.4 mg by mouth daily., Disp: , Rfl:  .  TRESIBA FLEXTOUCH 100 UNIT/ML FlexTouch Pen, INJECT 30 UNITS SUBCUTANEOUSLY DAILY, DECREASE BY 2 UNITS EVERY 3 DAYS UNTIL FASTING BLOOD SUGARS ARE 100-120 RANGE. (Patient taking differently: Inject 26 Units into the skin daily.), Disp: 15 mL, Rfl: 1  No Known Allergies  Review of Systems   ROS Negative unless otherwise specified per HPI.  Vitals:  Vitals:   05/19/20 1335  BP: 130/70  Pulse: 68  Temp: 97.7 F (36.5 C)  TempSrc: Temporal  SpO2: 96%  Weight: 245 lb 6.1 oz (111.3 kg)  Height: '5\' 3"'  (1.6 m)     Body mass index is 43.47 kg/m.   Physical Exam:    Physical Exam Vitals and nursing note reviewed.  Constitutional:      General: She is not in acute distress.    Appearance: She is well-developed. She is not ill-appearing, toxic-appearing or sickly-appearing.  HENT:     Head: Normocephalic and atraumatic.     Right Ear: Tympanic membrane, ear canal and external ear normal. Tympanic membrane is not erythematous, retracted or bulging.     Left Ear: Tympanic membrane, ear  canal and external ear normal. Tympanic membrane is not erythematous, retracted or bulging.     Nose: Nose normal.     Right Sinus: No maxillary sinus tenderness or frontal sinus tenderness.     Left Sinus: No maxillary sinus tenderness or frontal sinus tenderness.     Mouth/Throat:     Pharynx: Uvula midline. No posterior oropharyngeal edema or posterior oropharyngeal erythema.  Eyes:     General: Lids are normal.     Conjunctiva/sclera: Conjunctivae normal.  Neck:     Trachea: Trachea normal.  Cardiovascular:     Rate and Rhythm: Normal rate and regular rhythm.     Pulses: Normal pulses.     Heart sounds: Normal heart sounds, S1 normal and S2 normal.     Comments: No LE edema Pulmonary:     Effort: Pulmonary effort is normal.     Breath sounds: Normal breath sounds. No decreased breath sounds, wheezing, rhonchi or rales.  Lymphadenopathy:     Cervical: No cervical adenopathy.  Skin:    General: Skin is warm, dry and intact.  Neurological:     Mental Status: She is alert.     GCS: GCS eye subscore is 4. GCS verbal subscore is 5. GCS motor subscore is 6.  Psychiatric:        Mood and Affect: Mood and affect normal.        Speech: Speech normal.        Behavior: Behavior normal. Behavior is cooperative.      Assessment and Plan:    Brittany Acosta was seen today for sinus problem and otalgia.  Diagnoses and all orders for this visit:  Dysfunction of both eustachian tubes; Sinus congestion No red flags on exam. She is not showing signs of infection on my exam today. Will trial slight increase of prednisone from 5 mg daily to 20 mg daily for 5 days. Did discuss that this medication can make her mood worse so if this occurs, to stop immediately and resume the 5 mg daily. If worsening or lack of improvement, will trial another round of different oral antibiotics if indicated. I also recommended that she consider switching her antihistamine to see if she responds to a different  medication such as xyzal, claritin, etc.  Yeast infection Trial oral diflucan. If symptoms worsen/persist, will do further evaluation.  Other orders -     fluconazole (DIFLUCAN) 150 MG tablet; Take 1 tablet (150 mg total) by mouth once for 1 dose. -     predniSONE (DELTASONE) 20 MG tablet; Take 1 tablet (20 mg total) by mouth daily with breakfast.   CMA or LPN served as scribe during this visit. History, Physical, and Plan performed by medical provider. The above documentation  has been reviewed and is accurate and complete.  Inda Coke, PA-C [place] [place] 05/19/2020  Follow-up: No follow-ups on file.

## 2020-05-19 NOTE — Patient Instructions (Signed)
It was great to see you!  Let's trial a slight increase in your prednisone to 20 mg daily for 5 days. If you feel like this causes significant worsening of your mood, please stop this medication immediately and let us know. Resume 5 mg daily after stopping the 20 mg daily.  Take care,  Inda Coke PA-C

## 2020-06-02 ENCOUNTER — Other Ambulatory Visit: Payer: Self-pay

## 2020-06-02 MED ORDER — PREGABALIN 50 MG PO CAPS: 50.0000 mg | ORAL_CAPSULE | Freq: Two times a day (BID) | ORAL | 2 refills | Status: DC

## 2020-06-02 NOTE — Telephone Encounter (Signed)
Left detailed message on personal voicemail Rx for Lyrica will be sent to pharmacy and I will send Rx for Shingrix also for your husband.

## 2020-06-02 NOTE — Telephone Encounter (Signed)
Pt requesting refill for Lyrica.

## 2020-06-02 NOTE — Telephone Encounter (Signed)
..   LAST APPOINTMENT DATE: 05/19/2020   NEXT APPOINTMENT DATE:@3 /22/2022  MEDICATION:pregabalin (LYRICA) 50 MG capsule

## 2020-06-05 ENCOUNTER — Other Ambulatory Visit: Payer: Self-pay | Admitting: *Deleted

## 2020-06-05 DIAGNOSIS — H9203 Otalgia, bilateral: Secondary | ICD-10-CM

## 2020-06-05 DIAGNOSIS — J019 Acute sinusitis, unspecified: Secondary | ICD-10-CM

## 2020-06-05 MED ORDER — FLUTICASONE PROPIONATE 50 MCG/ACT NA SUSP: 2.0000 | Freq: Every day | NASAL | 1 refills | Status: DC

## 2020-06-09 ENCOUNTER — Other Ambulatory Visit: Payer: Self-pay

## 2020-06-09 MED ORDER — PREGABALIN 50 MG PO CAPS: 50.0000 mg | ORAL_CAPSULE | Freq: Two times a day (BID) | ORAL | 1 refills | Status: DC

## 2020-06-09 NOTE — Telephone Encounter (Signed)
Please see message and send Lyrica to mail order. Rx uploaded.

## 2020-06-09 NOTE — Telephone Encounter (Signed)
[facility] mail order pharmacy is calling in requesting script for pregabalin (lyrica) to be sent to them.  States patient has requested this.  Looks like this script was recently sent to [facility].  Can we please redirect?

## 2020-06-14 ENCOUNTER — Other Ambulatory Visit: Payer: Self-pay | Admitting: Hematology and Oncology

## 2020-06-18 DIAGNOSIS — U071 COVID-19: Secondary | ICD-10-CM | POA: Diagnosis not present

## 2020-06-22 ENCOUNTER — Other Ambulatory Visit: Payer: Self-pay | Admitting: Physician Assistant

## 2020-06-24 ENCOUNTER — Telehealth: Payer: Self-pay

## 2020-06-24 ENCOUNTER — Ambulatory Visit (INDEPENDENT_AMBULATORY_CARE_PROVIDER_SITE_OTHER): Payer: Medicare HMO | Admitting: Physician Assistant

## 2020-06-24 ENCOUNTER — Other Ambulatory Visit: Payer: Self-pay | Admitting: Physician Assistant

## 2020-06-24 ENCOUNTER — Ambulatory Visit (HOSPITAL_COMMUNITY)
Admission: RE | Admit: 2020-06-24 | Discharge: 2020-06-24 | Disposition: A | Discharge status: Home / Self Care | Payer: Medicare HMO | Source: Ambulatory Visit | Attending: Physician Assistant | Admitting: Physician Assistant

## 2020-06-24 ENCOUNTER — Telehealth: Payer: Self-pay | Admitting: *Deleted

## 2020-06-24 ENCOUNTER — Encounter: Payer: Self-pay | Admitting: Physician Assistant

## 2020-06-24 ENCOUNTER — Other Ambulatory Visit: Payer: Self-pay

## 2020-06-24 VITALS — BP 140/78 | HR 64 | Temp 97.3°F | Ht 61.5 in | Wt 246.5 lb

## 2020-06-24 DIAGNOSIS — E1169 Type 2 diabetes mellitus with other specified complication: Secondary | ICD-10-CM

## 2020-06-24 DIAGNOSIS — M1A9XX1 Chronic gout, unspecified, with tophus (tophi): Secondary | ICD-10-CM | POA: Diagnosis not present

## 2020-06-24 DIAGNOSIS — E1140 Type 2 diabetes mellitus with diabetic neuropathy, unspecified: Secondary | ICD-10-CM | POA: Diagnosis not present

## 2020-06-24 DIAGNOSIS — M79604 Pain in right leg: Secondary | ICD-10-CM | POA: Diagnosis not present

## 2020-06-24 DIAGNOSIS — L853 Xerosis cutis: Secondary | ICD-10-CM | POA: Diagnosis not present

## 2020-06-24 DIAGNOSIS — Z23 Encounter for immunization: Secondary | ICD-10-CM

## 2020-06-24 DIAGNOSIS — Z0001 Encounter for general adult medical examination with abnormal findings: Secondary | ICD-10-CM

## 2020-06-24 DIAGNOSIS — E669 Obesity, unspecified: Secondary | ICD-10-CM | POA: Diagnosis not present

## 2020-06-24 DIAGNOSIS — F319 Bipolar disorder, unspecified: Secondary | ICD-10-CM | POA: Diagnosis not present

## 2020-06-24 DIAGNOSIS — E7800 Pure hypercholesterolemia, unspecified: Secondary | ICD-10-CM | POA: Diagnosis not present

## 2020-06-24 DIAGNOSIS — I83811 Varicose veins of right lower extremities with pain: Secondary | ICD-10-CM

## 2020-06-24 LAB — CBC WITH DIFFERENTIAL/PLATELET
Basophils Absolute: 0.1 10*3/uL (ref 0.0–0.1)
Basophils Relative: 0.6 % (ref 0.0–3.0)
Eosinophils Absolute: 0.4 10*3/uL (ref 0.0–0.7)
Eosinophils Relative: 4.2 % (ref 0.0–5.0)
HCT: 31.8 % — ABNORMAL LOW (ref 36.0–46.0)
Hemoglobin: 10.4 g/dL — ABNORMAL LOW (ref 12.0–15.0)
Lymphocytes Relative: 18.6 % (ref 12.0–46.0)
Lymphs Abs: 1.6 10*3/uL (ref 0.7–4.0)
MCHC: 32.8 g/dL (ref 30.0–36.0)
MCV: 86.4 fl (ref 78.0–100.0)
Monocytes Absolute: 0.3 10*3/uL (ref 0.1–1.0)
Monocytes Relative: 4 % (ref 3.0–12.0)
Neutro Abs: 6.3 10*3/uL (ref 1.4–7.7)
Neutrophils Relative %: 72.6 % (ref 43.0–77.0)
Platelets: 489 10*3/uL — ABNORMAL HIGH (ref 150.0–400.0)
RBC: 3.67 Mil/uL — ABNORMAL LOW (ref 3.87–5.11)
RDW: 17.3 % — ABNORMAL HIGH (ref 11.5–15.5)
WBC: 8.7 10*3/uL (ref 4.0–10.5)

## 2020-06-24 LAB — LIPID PANEL
Cholesterol: 115 mg/dL (ref 0–200)
HDL: 48.5 mg/dL (ref >39.00)
LDL Cholesterol: 49 mg/dL (ref 0–99)
NonHDL: 66.81
Total CHOL/HDL Ratio: 2
Triglycerides: 89 mg/dL (ref 0.0–149.0)
VLDL: 17.8 mg/dL (ref 0.0–40.0)

## 2020-06-24 LAB — COMPREHENSIVE METABOLIC PANEL
ALT: 20 U/L (ref 0–35)
AST: 24 U/L (ref 0–37)
Albumin: 4.1 g/dL (ref 3.5–5.2)
Alkaline Phosphatase: 94 U/L (ref 39–117)
BUN: 13 mg/dL (ref 6–23)
CO2: 33 mEq/L — ABNORMAL HIGH (ref 19–32)
Calcium: 9.4 mg/dL (ref 8.4–10.5)
Chloride: 93 mEq/L — ABNORMAL LOW (ref 96–112)
Creatinine, Ser: 0.69 mg/dL (ref 0.40–1.20)
GFR: 88.27 mL/min (ref >60.00)
Glucose, Bld: 111 mg/dL — ABNORMAL HIGH (ref 70–99)
Potassium: 4 mEq/L (ref 3.5–5.1)
Sodium: 133 mEq/L — ABNORMAL LOW (ref 135–145)
Total Bilirubin: 0.4 mg/dL (ref 0.2–1.2)
Total Protein: 6.9 g/dL (ref 6.0–8.3)

## 2020-06-24 LAB — URIC ACID: Uric Acid, Serum: 4 mg/dL (ref 2.4–7.0)

## 2020-06-24 LAB — HEMOGLOBIN A1C: Hgb A1c MFr Bld: 7.7 % — ABNORMAL HIGH (ref 4.6–6.5)

## 2020-06-24 MED ORDER — CYCLOBENZAPRINE HCL 5 MG PO TABS: 5.0000 mg | ORAL_TABLET | Freq: Every evening | ORAL | 3 refills | Status: DC | PRN

## 2020-06-24 NOTE — Telephone Encounter (Signed)
Brittany Acosta from [facility] vascular calling with report on pt . Negative for DVT Right Leg, going to let pt go. Told her that is fine. Thank you.

## 2020-06-24 NOTE — Patient Instructions (Addendum)
It was great to see you!  Flexeril muscle relaxer refilled  You will be contacted about your ultrasound for your leg  Try this lotion for your hands and follow-up with rheumatology    Please go to the lab for blood work.   Our office will call you with your results unless you have chosen to receive results via MyChart.  If your blood work is normal we will follow-up each year for physicals and as scheduled for chronic medical problems.  If anything is abnormal we will treat accordingly and get you in for a follow-up.  Take care,  [facility] Maintenance, Female Adopting a healthy lifestyle and getting preventive care are important in promoting health and wellness. Ask your health care provider about:  The right schedule for you to have regular tests and exams.  Things you can do on your own to prevent diseases and keep yourself healthy. What should I know about diet, weight, and exercise? Eat a healthy diet  Eat a diet that includes plenty of vegetables, fruits, low-fat dairy products, and lean protein.  Do not eat a lot of foods that are high in solid fats, added sugars, or sodium.   Maintain a healthy weight Body mass index (BMI) is used to identify weight problems. It estimates body fat based on height and weight. Your health care provider can help determine your BMI and help you achieve or maintain a healthy weight. Get regular exercise Get regular exercise. This is one of the most important things you can do for your health. Most adults should:  Exercise for at least 150 minutes each week. The exercise should increase your heart rate and make you sweat (moderate-intensity exercise).  Do strengthening exercises at least twice a week. This is in addition to the moderate-intensity exercise.  Spend less time sitting. Even light physical activity can be beneficial. Watch cholesterol and blood lipids Have your blood tested for lipids and cholesterol at 70 years of  age, then have this test every 5 years. Have your cholesterol levels checked more often if:  Your lipid or cholesterol levels are high.  You are older than 70 years of age.  You are at high risk for heart disease. What should I know about cancer screening? Depending on your health history and family history, you may need to have cancer screening at various ages. This may include screening for:  Breast cancer.  Cervical cancer.  Colorectal cancer.  Skin cancer.  Lung cancer. What should I know about heart disease, diabetes, and high blood pressure? Blood pressure and heart disease  High blood pressure causes heart disease and increases the risk of stroke. This is more likely to develop in people who have high blood pressure readings, are of African descent, or are overweight.  Have your blood pressure checked: ? Every 3-5 years if you are 31-56 years of age. ? Every year if you are 59 years old or older. Diabetes Have regular diabetes screenings. This checks your fasting blood sugar level. Have the screening done:  Once every three years after age 25 if you are at a normal weight and have a low risk for diabetes.  More often and at a younger age if you are overweight or have a high risk for diabetes. What should I know about preventing infection? Hepatitis B If you have a higher risk for hepatitis B, you should be screened for this virus. Talk with your health care provider to find out if you are  at risk for hepatitis B infection. Hepatitis C Testing is recommended for:  Everyone born from [birth year] through 1965.  Anyone with known risk factors for hepatitis C. Sexually transmitted infections (STIs)  Get screened for STIs, including gonorrhea and chlamydia, if: ? You are sexually active and are younger than 70 years of age. ? You are older than 70 years of age and your health care provider tells you that you are at risk for this type of infection. ? Your sexual activity has  changed since you were last screened, and you are at increased risk for chlamydia or gonorrhea. Ask your health care provider if you are at risk.  Ask your health care provider about whether you are at high risk for HIV. Your health care provider may recommend a prescription medicine to help prevent HIV infection. If you choose to take medicine to prevent HIV, you should first get tested for HIV. You should then be tested every 3 months for as long as you are taking the medicine. Pregnancy  If you are about to stop having your period (premenopausal) and you may become pregnant, seek counseling before you get pregnant.  Take 400 to 800 micrograms (mcg) of folic acid every day if you become pregnant.  Ask for birth control (contraception) if you want to prevent pregnancy. Osteoporosis and menopause Osteoporosis is a disease in which the bones lose minerals and strength with aging. This can result in bone fractures. If you are 79 years old or older, or if you are at risk for osteoporosis and fractures, ask your health care provider if you should:  Be screened for bone loss.  Take a calcium or vitamin D supplement to lower your risk of fractures.  Be given hormone replacement therapy (HRT) to treat symptoms of menopause. Follow these instructions at home: Lifestyle  Do not use any products that contain nicotine or tobacco, such as cigarettes, e-cigarettes, and chewing tobacco. If you need help quitting, ask your health care provider.  Do not use street drugs.  Do not share needles.  Ask your health care provider for help if you need support or information about quitting drugs. Alcohol use  Do not drink alcohol if: ? Your health care provider tells you not to drink. ? You are pregnant, may be pregnant, or are planning to become pregnant.  If you drink alcohol: ? Limit how much you use to 0-1 drink a day. ? Limit intake if you are breastfeeding.  Be aware of how much alcohol is in  your drink. In the [place] one drink equals one 12 oz bottle of beer (355 mL), one 5 oz glass of wine (148 mL), or one 1 oz glass of hard liquor (44 mL). General instructions  Schedule regular health, dental, and eye exams.  Stay current with your vaccines.  Tell your health care provider if: ? You often feel depressed. ? You have ever been abused or do not feel safe at home. Summary  Adopting a healthy lifestyle and getting preventive care are important in promoting health and wellness.  Follow your health care provider's instructions about healthy diet, exercising, and getting tested or screened for diseases.  Follow your health care provider's instructions on monitoring your cholesterol and blood pressure. This information is not intended to replace advice given to you by your health care provider. Make sure you discuss any questions you have with your health care provider. Document Revised: 03/15/2018 Document Reviewed: 03/15/2018 Elsevier Patient Education  2021 [employer].

## 2020-06-24 NOTE — Addendum Note (Signed)
Addended by: Marian Sorrow on: 06/24/2020 04:27 PM   Modules accepted: Orders

## 2020-06-24 NOTE — Progress Notes (Incomplete)
Right lower ext. Venous duplex  has been completed. Refer to [facility] under chart review to view preliminary results. Results given to Butch Penny.  06/24/2020  3:23 PM Sturdivant, Bonnye Fava

## 2020-06-24 NOTE — Telephone Encounter (Signed)
Please see message from Vascular regarding pt.

## 2020-06-24 NOTE — Progress Notes (Signed)
I acted as a Education administrator for [employer], PA-C Anselmo Pickler, LPN    Subjective:    Brittany Acosta is a 70 y.o. female and is here for a comprehensive physical exam.   HPI  Health Maintenance Due  Topic Date Due  . OPHTHALMOLOGY EXAM  11/01/2017  . PNA vac Low Risk Adult (2 of 2 - PPSV23) 08/09/2018  . COVID-19 Vaccine (4 - Booster for Pfizer series) 06/19/2020    Acute Concerns: R leg pain -- over the past 3-4 weeks has had increased pain to R leg. Has fullness and more torturous veins than she remembers having. She also endorses slight swelling to her leg. Has compression hose but does not use. Denies prior blood clot. Hand pain -- has cracking skin with bleeding to bilateral hands. Doesn't feel like she overwashes her hands. Has tried healing hands, liquid gloves, wearing lotion with gloves at night but has had no relief.   Chronic Issues: Diabetes -- 3 month follow-up. Current DM meds: 26 units of tresiba daily and 500 mg metformin BID. Blood sugars at home are: 100 - 180. Patient is compliant with medications. Denies: hypoglycemic or hyperglycemic episodes or symptoms. This patient's diabetes is complicated by HLD, HTN.  Lab Results  Component Value Date   HGBA1C 6.6 (H) 02/08/2020   Gout -- takes 300 mg allopurinol daily. Has not had a gout flare in awhile.  HLD -- takes pravastatin 20 mg daily. Denies myalgias with this medication.  Health Maintenance: Immunizations -- UTD, will give PNA 23 today Cologuard -- UTD due 09/2022 Mammogram -- UTD PAP -- N/A Hysterectomy Bone Density -- N/A Diet -- sometimes reduced appetite; diet sundrop in the morning; water in afternoon and regular soda in the afternoon Caffeine intake -- soda in PM Sleep habits -- insomnia Exercise -- limited due to chronic pain Weight -- Weight: 246 lb 8 oz (111.8 kg)  Mood -- denies concerns Weight history: Wt Readings from Last 10 Encounters:  06/24/20 246 lb 8 oz (111.8 kg)  05/19/20 245 lb  6.1 oz (111.3 kg)  02/27/20 256 lb 6.4 oz (116.3 kg)  02/08/20 255 lb 4 oz (115.8 kg)  01/25/20 251 lb 12.8 oz (114.2 kg)  12/28/19 252 lb 4 oz (114.4 kg)  09/14/19 255 lb (115.7 kg)  05/11/18 251 lb 12.8 oz (114.2 kg)  04/25/18 254 lb (115.2 kg)  04/21/18 255 lb 11.2 oz (116 kg)   Body mass index is 45.82 kg/m. No LMP recorded. Patient has had a hysterectomy.  Depression screen PHQ 2/9 06/24/2020  Decreased Interest 0  Down, Depressed, Hopeless 0  PHQ - 2 Score 0  Altered sleeping -  Tired, decreased energy -  Change in appetite -  Feeling bad or failure about yourself  -  Trouble concentrating -  Moving slowly or fidgety/restless -  Suicidal thoughts -  PHQ-9 Score -  Difficult doing work/chores -  Some recent data might be hidden     Other providers/specialists: Patient Care Team: Inda Coke, [place] as PCP - General (Physician Assistant) Buford Dresser, MD as PCP - Cardiology (Cardiology) Renato Shin, MD as Consulting Physician (Endocrinology) Frederik Pear, MD as Consulting Physician (Orthopedic Surgery) Juanita Craver, MD as Consulting Physician (Gastroenterology) Melida Quitter, MD as Consulting Physician (Otolaryngology) Brand Males, MD (Pulmonary Disease) Harriet Masson, DPM (Podiatry) Earlean Polka, MD (Ophthalmology) Norma Fredrickson, MD (Psychiatry) Tania Ade, MD (Orthopedic Surgery) Nicholas Lose, MD as Consulting Physician (Hematology and Oncology) Sanda Klein as Radiation Oncologist (Radiology)  Lahoma Rocker, MD as Consulting Physician (Rheumatology) Gerda Diss, DO as Referring Physician (Sports Medicine)    PMHx, SurgHx, SocialHx, Medications, and Allergies were reviewed in the Visit Navigator and updated as appropriate.   Past Medical History:  Diagnosis Date  . Allergic rhinitis, cause unspecified   . Arthritis   . Bipolar disorder, unspecified ([place])    depression  . Cancer ([place])    skin cancer on left leg  .  Chronic diastolic heart failure ([place])   . Depression   . GERD (gastroesophageal reflux disease)   . Headache   . History of gout    "haven't had it in several years" (02/08/2012)  . History of kidney stones   . History of wrist fracture    rt wrist  . Hypertension   . Iron deficiency anemia   . Kidney stones    sees urologist @ [place]  . Morbid obesity ([place])   . Neuropathy due to secondary diabetes ([place])   . Nontoxic multinodular goiter   . Osteoporosis   . PTSD (post-traumatic stress disorder)   . Pure hypercholesterolemia   . RBBB   . Scleroderma ([place])   . Sleep apnea 7/14   mild osa-did not need cpap -dr Gwenette Greet  . Type II diabetes mellitus ([place])   . Unspecified hypothyroidism      Past Surgical History:  Procedure Laterality Date  . ABDOMINAL HYSTERECTOMY  1976  . BREAST EXCISIONAL BIOPSY Left   . BREAST LUMPECTOMY WITH RADIOACTIVE SEED AND SENTINEL LYMPH NODE BIOPSY Left 04/25/2018   Procedure: LEFT BREAST LUMPECTOMY WITH RADIOACTIVE SEED AND SENTINEL LYMPH NODE BIOPSY;  Surgeon: Excell Seltzer, MD;  Location: [facility];  Service: General;  Laterality: Left;  . BREAST SURGERY    . CARDIAC CATHETERIZATION  2001   sees Dr Peter Martinique  . CATARACT EXTRACTION  2014  . CATARACT EXTRACTION, BILATERAL  02/2011   epps  . CHOLECYSTECTOMY  1985  . EXCISIONAL HEMORRHOIDECTOMY     "dr cut out in his office" (02/08/2012)  . INCISIONAL BREAST BIOPSY  2000   right  . JOINT REPLACEMENT     rt knee  . KNEE ARTHROSCOPY  08/2009   right  . KNEE ARTHROSCOPY WITH MEDIAL MENISECTOMY     left  . Left Cystoscopy   1990  . LEFT OOPHORECTOMY  1980  . Lithotripsy (L) Kidney  1997  . LUMBAR LAMINECTOMY/DECOMPRESSION MICRODISCECTOMY Left 07/01/2016   Procedure: Laminectomy for synovial cyst - left - Lumbar four-five;  Surgeon: Earnie Larsson, MD;  Location: [facility];  Service: Neurosurgery;  Laterality: Left;  . PARTIAL MASTECTOMY WITH NEEDLE LOCALIZATION Left 09/01/2012   Procedure: PARTIAL  MASTECTOMY WITH NEEDLE LOCALIZATION;  Surgeon: Adin Hector, MD;  Location: [facility];  Service: General;  Laterality: Left;  . Percitania stone removed (L) Kidney  1992  . Right nasal surgery  08/1988  . Right sinus removed  08/1989   tooth partial  . ROTATOR CUFF REPAIR  2013   right shoulder x 2  . SHOULDER ARTHROSCOPY WITH ROTATOR CUFF REPAIR AND SUBACROMIAL DECOMPRESSION Left 08/20/2013   Procedure: SHOULDER ARTHROSCOPY  AND SUBACROMIAL DECOMPRESSION;  Surgeon: Nita Sells, MD;  Location: [facility];  Service: Orthopedics;  Laterality: Left;  Left shoulder arthroscopy, debridement, subacromial decompression, distal clavical resection  . THYROIDECTOMY  04/22/2011   Procedure: THYROIDECTOMY;  Surgeon: Onnie Graham, MD;  Location: [facility];  Service: ENT;  Laterality: N/A;  TOTAL THYROIDECOTMY  . TOTAL KNEE ARTHROPLASTY  06/18/2011   Procedure: TOTAL KNEE ARTHROPLASTY;  Surgeon: Kerin Salen, MD;  Location: [facility];  Service: Orthopedics;  Laterality: Left;  DEPUY  . TOTAL KNEE ARTHROPLASTY  02/07/2012   Procedure: TOTAL KNEE ARTHROPLASTY;  Surgeon: Kerin Salen, MD;  Location: [facility];  Service: Orthopedics;  Laterality: Right;  . TUBAL LIGATION  1972     Family History  Problem Relation Age of Onset  . Hypertension Mother   . Lung cancer Mother        non-smoker  . Diabetes Father   . Hypertension Father   . Hyperlipidemia Father   . Heart attack Other   . Coronary artery disease Other   . Hypertension Sister   . Hypertension Brother   . Hypertension Brother   . Hypertension Brother   . Hypertension Sister   . Hypertension Sister   . Diabetes Daughter     Social History   Tobacco Use  . Smoking status: Former Smoker    Packs/day: 1.00    Years: 6.00    Pack years: 6.00    Types: Cigarettes    Quit date: 04/06/1983    Years since quitting: 37.2  . Smokeless tobacco: Never Used  . Tobacco comment: Married, lives with spouse. Disable- 2 grown kids-6  g-kids  Vaping Use  . Vaping Use: Never used  Substance Use Topics  . Alcohol use: No    Comment: none  . Drug use: No    Review of Systems:   Review of Systems  Constitutional: Negative for chills, fever, malaise/fatigue and weight loss.  HENT: Negative for hearing loss, sinus pain and sore throat.   Eyes: Negative for blurred vision.  Respiratory: Negative for cough and shortness of breath.   Cardiovascular: Negative for chest pain, palpitations and leg swelling.  Gastrointestinal: Negative for abdominal pain, constipation, diarrhea, heartburn, nausea and vomiting.  Genitourinary: Negative for dysuria, frequency and urgency.  Musculoskeletal: Negative for back pain, myalgias and neck pain.  Skin: Positive for rash. Negative for itching.  Neurological: Negative for dizziness, tingling, seizures, loss of consciousness and headaches.  Endo/Heme/Allergies: Negative for polydipsia.  Psychiatric/Behavioral: Negative for depression. The patient is not nervous/anxious.   All other systems reviewed and are negative.   Objective:   BP 140/78 (BP Location: Left Arm, Patient Position: Sitting, Cuff Size: Large)   Pulse 64   Temp (!) 97.3 F (36.3 C) (Temporal)   Ht 5' 1.5" (1.562 m)   Wt 246 lb 8 oz (111.8 kg)   SpO2 97%   BMI 45.82 kg/m  Body mass index is 45.82 kg/m.   General Appearance:    Alert, cooperative, no distress, appears stated age  Head:    Normocephalic, without obvious abnormality, atraumatic  Eyes:    PERRL, conjunctiva/corneas clear, EOM's intact, fundi    benign, both eyes  Ears:    Normal TM's and external ear canals, both ears  Nose:   Nares normal, septum midline, mucosa normal, no drainage    or sinus tenderness  Throat:   Lips, mucosa, and tongue normal; teeth and gums normal  Neck:   Supple, symmetrical, trachea midline, no adenopathy;    thyroid:  no enlargement/tenderness/nodules; no carotid   bruit or JVD  Back:     Symmetric, no curvature, ROM  normal, no CVA tenderness  Lungs:     Clear to auscultation bilaterally, respirations unlabored  Chest Wall:    No tenderness or deformity   Heart:    Regular rate and  rhythm, S1 and S2 normal, no murmur, rub or gallop  Breast Exam:    Deferred  Abdomen:     Soft, non-tender, bowel sounds active all four quadrants,    no masses, no organomegaly  Genitalia:    Deferred  Extremities:   Extremities normal, atraumatic R leg with calf TTP Torturous veins in R calf and leg Trace edema in b/l legs  Pulses:   2+ and symmetric all extremities  Skin:   Skin color, texture, turgor normal Bilateral fingers with erythema, MCP joints with small linear lacerations without significant drainage or TTP  Lymph nodes:   Cervical, supraclavicular, and axillary nodes normal  Neurologic:   CNII-XII intact, normal strength, sensation and reflexes    throughout   Assessment/Plan:   Janille was seen today for annual exam.  Diagnoses and all orders for this visit:  Encounter for general adult medical examination with abnormal findings Today patient counseled on age appropriate routine health concerns for screening and prevention, each reviewed and up to date or declined. Immunizations reviewed and up to date or declined. Labs ordered and reviewed. Risk factors for depression reviewed and negative. Hearing function and visual acuity are intact. ADLs screened and addressed as needed. Functional ability and level of safety reviewed and appropriate. Education, counseling and referrals performed based on assessed risks today. Patient provided with a copy of personalized plan for preventive services.  Right leg pain Will r/o blood clot with u/s. Recommend compression hose. If normal u/s, will refer to vein and vascular for further evaluation/mgmt. -     VAS [place] LOWER EXTREMITY VENOUS (DVT); Future  Diabetes mellitus type 2 in obese (HCC) Update HgbA1c and provide recommendations as able. In the meantime, continue  to trial to lower insulin as able. Currently at 26 units tresiba daily. Continue metformin 500 mg BID. Follow-up based on level of A1c control. Stop sodas. 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 Further mgmt per rheum. -     Uric acid  Hypercholesterolemia Update lipid panel and adjust pravastatin 20 mg daily as indicated. -     Lipid panel  Dry skin Trial topical eucerin advanced repair to see if this helps.  Other orders -     cyclobenzaprine (FLEXERIL) 5 MG tablet; Take 1 tablet (5 mg total) by mouth at bedtime as needed for muscle spasms.     Well Adult Exam: Labs ordered: Yes. Patient counseling was done. See below for items discussed. Discussed the patient's BMI.  The BMI is not in the acceptable range; BMI management plan is completed Follow up TBD.  Breast cancer screening: UTD. Cervical cancer screening: n/a   Patient Counseling: [x]    Nutrition: Stressed importance of moderation in sodium/caffeine intake, saturated fat and cholesterol, caloric balance, sufficient intake of fresh fruits, vegetables, fiber, calcium, iron, and 1 mg of folate supplement per day (for females capable of pregnancy).  [x]    Stressed the importance of regular exercise.   [x]    Substance Abuse: Discussed cessation/primary prevention of tobacco, alcohol, or other drug use; driving or other dangerous activities under the influence; availability of treatment for abuse.   [x]    Injury prevention: Discussed safety belts, safety helmets, smoke detector, smoking near bedding or upholstery.   [x]    Sexuality: Discussed sexually transmitted diseases, partner selection, use of condoms, avoidance of unintended pregnancy  and contraceptive alternatives.  [x]   Dental health: Discussed importance of regular tooth brushing, flossing, and dental visits.  [x]    Health maintenance and immunizations reviewed. Please refer  to Health maintenance section.   CMA or LPN served as scribe during this visit. History, Physical, and Plan performed by medical provider. The above documentation has been reviewed and is accurate and complete.   Inda Coke, PA-C [place]

## 2020-06-24 NOTE — Telephone Encounter (Signed)
Patient is scheduled at [facility] to have STAT DTV done she will go to [facility] entrance C use the valet parking and once inside she will be instructed on where to go-I left her a VM giving her these instructions and to make sure she arrives at 3:15 for a 3:30 appointment

## 2020-06-26 ENCOUNTER — Other Ambulatory Visit: Payer: Self-pay | Admitting: *Deleted

## 2020-06-26 DIAGNOSIS — M25631 Stiffness of right wrist, not elsewhere classified: Secondary | ICD-10-CM | POA: Diagnosis not present

## 2020-06-26 DIAGNOSIS — M25641 Stiffness of right hand, not elsewhere classified: Secondary | ICD-10-CM | POA: Diagnosis not present

## 2020-06-26 MED ORDER — METFORMIN HCL 500 MG PO TABS: ORAL_TABLET | ORAL | 1 refills | Status: DC

## 2020-07-01 DIAGNOSIS — M25631 Stiffness of right wrist, not elsewhere classified: Secondary | ICD-10-CM | POA: Diagnosis not present

## 2020-07-01 DIAGNOSIS — M25641 Stiffness of right hand, not elsewhere classified: Secondary | ICD-10-CM | POA: Diagnosis not present

## 2020-07-02 DIAGNOSIS — M25542 Pain in joints of left hand: Secondary | ICD-10-CM | POA: Diagnosis not present

## 2020-07-02 DIAGNOSIS — G5602 Carpal tunnel syndrome, left upper limb: Secondary | ICD-10-CM | POA: Insufficient documentation

## 2020-07-02 DIAGNOSIS — M25541 Pain in joints of right hand: Secondary | ICD-10-CM | POA: Diagnosis not present

## 2020-07-02 DIAGNOSIS — M79642 Pain in left hand: Secondary | ICD-10-CM | POA: Insufficient documentation

## 2020-07-02 DIAGNOSIS — M25511 Pain in right shoulder: Secondary | ICD-10-CM | POA: Diagnosis not present

## 2020-07-03 DIAGNOSIS — M25631 Stiffness of right wrist, not elsewhere classified: Secondary | ICD-10-CM | POA: Diagnosis not present

## 2020-07-03 DIAGNOSIS — M25641 Stiffness of right hand, not elsewhere classified: Secondary | ICD-10-CM | POA: Diagnosis not present

## 2020-07-05 ENCOUNTER — Other Ambulatory Visit: Payer: Self-pay | Admitting: Physician Assistant

## 2020-07-08 ENCOUNTER — Other Ambulatory Visit: Payer: Self-pay

## 2020-07-08 DIAGNOSIS — M79604 Pain in right leg: Secondary | ICD-10-CM

## 2020-07-10 DIAGNOSIS — M25631 Stiffness of right wrist, not elsewhere classified: Secondary | ICD-10-CM | POA: Diagnosis not present

## 2020-07-10 DIAGNOSIS — M25641 Stiffness of right hand, not elsewhere classified: Secondary | ICD-10-CM | POA: Diagnosis not present

## 2020-07-14 DIAGNOSIS — M0609 Rheumatoid arthritis without rheumatoid factor, multiple sites: Secondary | ICD-10-CM | POA: Diagnosis not present

## 2020-07-14 DIAGNOSIS — Z13820 Encounter for screening for osteoporosis: Secondary | ICD-10-CM | POA: Diagnosis not present

## 2020-07-14 DIAGNOSIS — Z79899 Other long term (current) drug therapy: Secondary | ICD-10-CM | POA: Diagnosis not present

## 2020-07-14 DIAGNOSIS — M064 Inflammatory polyarthropathy: Secondary | ICD-10-CM | POA: Diagnosis not present

## 2020-07-14 DIAGNOSIS — G629 Polyneuropathy, unspecified: Secondary | ICD-10-CM | POA: Diagnosis not present

## 2020-07-14 DIAGNOSIS — M8589 Other specified disorders of bone density and structure, multiple sites: Secondary | ICD-10-CM | POA: Diagnosis not present

## 2020-07-14 DIAGNOSIS — E119 Type 2 diabetes mellitus without complications: Secondary | ICD-10-CM | POA: Diagnosis not present

## 2020-07-14 DIAGNOSIS — M109 Gout, unspecified: Secondary | ICD-10-CM | POA: Diagnosis not present

## 2020-07-14 DIAGNOSIS — M1990 Unspecified osteoarthritis, unspecified site: Secondary | ICD-10-CM | POA: Diagnosis not present

## 2020-07-14 DIAGNOSIS — M797 Fibromyalgia: Secondary | ICD-10-CM | POA: Diagnosis not present

## 2020-07-14 DIAGNOSIS — M8580 Other specified disorders of bone density and structure, unspecified site: Secondary | ICD-10-CM | POA: Diagnosis not present

## 2020-07-14 DIAGNOSIS — R768 Other specified abnormal immunological findings in serum: Secondary | ICD-10-CM | POA: Diagnosis not present

## 2020-07-15 DIAGNOSIS — M25631 Stiffness of right wrist, not elsewhere classified: Secondary | ICD-10-CM | POA: Diagnosis not present

## 2020-07-15 DIAGNOSIS — M25641 Stiffness of right hand, not elsewhere classified: Secondary | ICD-10-CM | POA: Diagnosis not present

## 2020-07-22 DIAGNOSIS — U071 COVID-19: Secondary | ICD-10-CM | POA: Diagnosis not present

## 2020-07-24 ENCOUNTER — Ambulatory Visit: Payer: Medicare HMO | Admitting: Physician Assistant

## 2020-07-24 ENCOUNTER — Other Ambulatory Visit: Payer: Self-pay

## 2020-07-24 ENCOUNTER — Encounter: Payer: Self-pay | Admitting: Physician Assistant

## 2020-07-24 ENCOUNTER — Ambulatory Visit (HOSPITAL_COMMUNITY)
Admission: RE | Admit: 2020-07-24 | Discharge: 2020-07-24 | Disposition: A | Discharge status: Home / Self Care | Payer: Medicare HMO | Source: Ambulatory Visit | Attending: Physician Assistant | Admitting: Physician Assistant

## 2020-07-24 VITALS — BP 147/84 | HR 74 | Temp 97.7°F | Resp 20 | Ht 61.5 in | Wt 240.8 lb

## 2020-07-24 DIAGNOSIS — U071 COVID-19: Secondary | ICD-10-CM | POA: Diagnosis not present

## 2020-07-24 DIAGNOSIS — I83891 Varicose veins of right lower extremities with other complications: Secondary | ICD-10-CM | POA: Diagnosis not present

## 2020-07-24 DIAGNOSIS — M79604 Pain in right leg: Secondary | ICD-10-CM | POA: Diagnosis not present

## 2020-07-24 DIAGNOSIS — F319 Bipolar disorder, unspecified: Secondary | ICD-10-CM | POA: Diagnosis not present

## 2020-07-24 NOTE — Progress Notes (Signed)
Office Note     CC:  follow up Requesting Provider:  Worley, Samantha, PA  HPI: Brittany Acosta is a 69 y.o. (02/18/1951) female who presents for evaluation of prominent veins of right leg with mild edema.  She states she has had the symptoms which have been progressive over the last 2 months.  Discomfort of right lower extremity can come and go with rest or exertion.  She denies any history of DVT, venous ulceration, trauma, or prior vascular intervention.  She does not wear compression or make an effort to elevate her legs during the day.  She also denies claudication, rest pain, or nonhealing wounds of bilateral lower extremities.  She is a former smoker.  Past Medical History:  Diagnosis Date  . Allergic rhinitis, cause unspecified   . Arthritis   . Bipolar disorder, unspecified (HCC)    depression  . Cancer (HCC)    skin cancer on left leg  . Chronic diastolic heart failure (HCC)   . Depression   . GERD (gastroesophageal reflux disease)   . Headache   . History of gout    "haven't had it in several years" (02/08/2012)  . History of kidney stones   . History of wrist fracture    rt wrist  . Hypertension   . Iron deficiency anemia   . Kidney stones    sees urologist @ [facility]  . Morbid obesity (HCC)   . Neuropathy due to secondary diabetes (HCC)   . Nontoxic multinodular goiter   . Osteoporosis   . PTSD (post-traumatic stress disorder)   . Pure hypercholesterolemia   . RBBB   . Scleroderma (HCC)   . Sleep apnea 7/14   mild osa-did not need cpap -dr clance  . Type II diabetes mellitus (HCC)   . Unspecified hypothyroidism     Past Surgical History:  Procedure Laterality Date  . ABDOMINAL HYSTERECTOMY  1976  . BREAST EXCISIONAL BIOPSY Left   . BREAST LUMPECTOMY WITH RADIOACTIVE SEED AND SENTINEL LYMPH NODE BIOPSY Left 04/25/2018   Procedure: LEFT BREAST LUMPECTOMY WITH RADIOACTIVE SEED AND SENTINEL LYMPH NODE BIOPSY;  Surgeon: Hoxworth, Benjamin, MD;  Location: [facility];   Service: General;  Laterality: Left;  . BREAST SURGERY    . CARDIAC CATHETERIZATION  2001   sees Dr Peter Jordan  . CATARACT EXTRACTION  2014  . CATARACT EXTRACTION, BILATERAL  02/2011   epps  . CHOLECYSTECTOMY  1985  . EXCISIONAL HEMORRHOIDECTOMY     "dr cut out in his office" (02/08/2012)  . INCISIONAL BREAST BIOPSY  2000   right  . JOINT REPLACEMENT     rt knee  . KNEE ARTHROSCOPY  08/2009   right  . KNEE ARTHROSCOPY WITH MEDIAL MENISECTOMY     left  . Left Cystoscopy   1990  . LEFT OOPHORECTOMY  1980  . Lithotripsy (L) Kidney  1997  . LUMBAR LAMINECTOMY/DECOMPRESSION MICRODISCECTOMY Left 07/01/2016   Procedure: Laminectomy for synovial cyst - left - Lumbar four-five;  Surgeon: Henry Pool, MD;  Location: [facility];  Service: Neurosurgery;  Laterality: Left;  . PARTIAL MASTECTOMY WITH NEEDLE LOCALIZATION Left 09/01/2012   Procedure: PARTIAL MASTECTOMY WITH NEEDLE LOCALIZATION;  Surgeon: Haywood M Ingram, MD;  Location: [facility];  Service: General;  Laterality: Left;  . Percitania stone removed (L) Kidney  1992  . Right nasal surgery  08/1988  . Right sinus removed  08/1989   tooth partial  . ROTATOR CUFF REPAIR  2013   right shoulder x   2  . SHOULDER ARTHROSCOPY WITH ROTATOR CUFF REPAIR AND SUBACROMIAL DECOMPRESSION Left 08/20/2013   Procedure: SHOULDER ARTHROSCOPY  AND SUBACROMIAL DECOMPRESSION;  Surgeon: Justin William Chandler, MD;  Location: [facility];  Service: Orthopedics;  Laterality: Left;  Left shoulder arthroscopy, debridement, subacromial decompression, distal clavical resection  . THYROIDECTOMY  04/22/2011   Procedure: THYROIDECTOMY;  Surgeon: Dwight D Bates, MD;  Location: [facility];  Service: ENT;  Laterality: N/A;  TOTAL THYROIDECOTMY  . TOTAL KNEE ARTHROPLASTY  06/18/2011   Procedure: TOTAL KNEE ARTHROPLASTY;  Surgeon: Frank J Rowan, MD;  Location: [facility];  Service: Orthopedics;  Laterality: Left;  DEPUY  . TOTAL KNEE ARTHROPLASTY  02/07/2012   Procedure: TOTAL KNEE  ARTHROPLASTY;  Surgeon: Frank J Rowan, MD;  Location: [facility];  Service: Orthopedics;  Laterality: Right;  . TUBAL LIGATION  1972    Social History   Socioeconomic History  . Marital status: Married    Spouse name: Not on file  . Number of children: 2  . Years of education: 14  . Highest education level: Not on file  Occupational History  . Occupation: disability    Employer: RETIRED  Tobacco Use  . Smoking status: Former Smoker    Packs/day: 1.00    Years: 6.00    Pack years: 6.00    Types: Cigarettes    Quit date: 04/06/1983    Years since quitting: 37.3  . Smokeless tobacco: Never Used  . Tobacco comment: Married, lives with spouse. Disable- 2 grown kids-6 g-kids  Vaping Use  . Vaping Use: Never used  Substance and Sexual Activity  . Alcohol use: No    Comment: none  . Drug use: No  . Sexual activity: Not Currently  Other Topics Concern  . Not on file  Social History Narrative   Married, lives with spouse - 2 adult children   Disabled -- started around 2000 for mental health reasons, Bipolar Disorder/PTSD   Husband works   Social Determinants of Health   Financial Resource Strain: Not on file  Food Insecurity: Not on file  Transportation Needs: Not on file  Physical Activity: Not on file  Stress: Not on file  Social Connections: Not on file  Intimate Partner Violence: Not on file    Family History  Problem Relation Age of Onset  . Hypertension Mother   . Lung cancer Mother        non-smoker  . Diabetes Father   . Hypertension Father   . Hyperlipidemia Father   . Heart attack Other   . Coronary artery disease Other   . Hypertension Sister   . Hypertension Brother   . Hypertension Brother   . Hypertension Brother   . Hypertension Sister   . Hypertension Sister   . Diabetes Daughter     Current Outpatient Medications  Medication Sig Dispense Refill  . allopurinol (ZYLOPRIM) 300 MG tablet Take 1 tablet (300 mg total) by mouth daily. 90 tablet 1  .  anastrozole (ARIMIDEX) 1 MG tablet TAKE 1 TABLET EVERY DAY 30 tablet 0  . aspirin EC 81 MG tablet Take 1 tablet (81 mg total) by mouth daily. Swallow whole. 90 tablet 3  . Biotin 10 MG CAPS Take 10 mg by mouth at bedtime.    . BLACK ELDERBERRY PO     . Blood Glucose Monitoring Suppl (ACCU-CHEK AVIVA PLUS) w/Device KIT USE TO CHECK BLOOD SUGAR 2 TIMES PER DAY. (Patient taking differently: 1 each by Other route See admin instructions.   USE TO CHECK BLOOD SUGAR 2 TIMES PER DAY.) 1 kit 2  . Calcium Carbonate-Vitamin D (CALTRATE 600+D PO) Take 1 capsule by mouth in the morning and at bedtime.    . cetirizine (ZYRTEC) 10 MG tablet Take 20 mg by mouth daily.     . Cholecalciferol (VITAMIN D3 PO) Take 2,000 Units by mouth daily.     . cyclobenzaprine (FLEXERIL) 5 MG tablet Take 1 tablet (5 mg total) by mouth at bedtime as needed for muscle spasms. 90 tablet 3  . doxepin (SINEQUAN) 10 MG capsule Take 1 capsule by mouth daily.    . fluticasone (FLONASE) 50 MCG/ACT nasal spray Place 2 sprays into both nostrils daily. 48 g 1  . folic acid (FOLVITE) 1 MG tablet Take 2 mg by mouth daily.    . furosemide (LASIX) 40 MG tablet Take 1 tablet (40 mg total) by mouth daily. 90 tablet 3  . GLUCOSAMINE-CHONDROITIN PO Take 1 tablet by mouth 2 (two) times daily.    Marland Kitchen glucose blood (ACCU-CHEK AVIVA PLUS) test strip USE TO CHECK BLOOD SUGAR TWO TIMES PER DAY. (Patient taking differently: 1 each by Other route See admin instructions. USE TO CHECK BLOOD SUGAR TWO TIMES PER DAY.) 200 each 3  . insulin degludec (TRESIBA FLEXTOUCH) 100 UNIT/ML FlexTouch Pen Inject 26 Units into the skin daily. 30 mL 1  . Insulin Pen Needle (DROPLET PEN NEEDLES) 31G X 5 MM MISC USE TO INJECT INSULIN DAILY 100 each 3  . Insulin Syringe-Needle U-100 (INSULIN SYRINGE 1CC/31GX5/16") 31G X 5/16" 1 ML MISC Use to inject insulin two times per day. (Patient taking differently: 1 each by Other route See admin instructions. Use to inject insulin two times per  day.) 200 each 2  . lamoTRIgine (LAMICTAL) 200 MG tablet Take 1 tablet (200 mg total) by mouth daily at 12 noon. 90 tablet 3  . Lancets (ACCU-CHEK MULTICLIX) lancets Use to check blood sugar two time per day. (Patient taking differently: 1 each by Other route in the morning and at bedtime. Use to check blood sugar two time per day.) 200 each 2  . levothyroxine (SYNTHROID, LEVOTHROID) 50 MCG tablet Take 1 tablet (50 mcg total) by mouth daily. --- Office visit needed for further refills (Patient taking differently: Take 50 mcg by mouth daily before breakfast.) 90 tablet 0  . lisinopril (ZESTRIL) 5 MG tablet TAKE 1 TABLET (5 MG TOTAL) BY MOUTH DAILY. 90 tablet 1  . metFORMIN (GLUCOPHAGE) 500 MG tablet Take 1000 mg in the AM and 500 mg in the PM. 360 tablet 1  . methotrexate 2.5 MG tablet Take 10 mg by mouth once a week.     Marland Kitchen omeprazole (PRILOSEC) 20 MG capsule Take 1 capsule (20 mg total) by mouth daily. Take 30 minutes before a meal (Patient taking differently: Take 20 mg by mouth daily at 12 noon. Take 30 minutes before a meal) 90 capsule 3  . OVER THE COUNTER MEDICATION Take 1 tablet by mouth daily. Bacopa    . oxybutynin (DITROPAN-XL) 10 MG 24 hr tablet Take 1 tablet (10 mg total) by mouth every evening. (Patient taking differently: Take 10 mg by mouth at bedtime.) 90 tablet 3  . potassium citrate (UROCIT-K) 10 MEQ (1080 MG) SR tablet     . pravastatin (PRAVACHOL) 20 MG tablet Take 1 tablet by mouth at bedtime.    . predniSONE (DELTASONE) 5 MG tablet Take 5 mg by mouth daily with breakfast.     . pregabalin (LYRICA)  50 MG capsule Take 1 capsule (50 mg total) by mouth 2 (two) times daily. 180 capsule 1  . sertraline (ZOLOFT) 100 MG tablet Take 100 mg by mouth daily.     . sulfaSALAzine (AZULFIDINE) 500 MG tablet 2 tablets    . tamsulosin (FLOMAX) 0.4 MG CAPS capsule Take 0.4 mg by mouth daily.    . Turmeric 500 MG TABS      No current facility-administered medications for this visit.    No  Known Allergies   REVIEW OF SYSTEMS:   [X] denotes positive finding, [ ] denotes negative finding Cardiac  Comments:  Chest pain or chest pressure:    Shortness of breath upon exertion:    Short of breath when lying flat:    Irregular heart rhythm:        Vascular    Pain in calf, thigh, or hip brought on by ambulation:    Pain in feet at night that wakes you up from your sleep:     Blood clot in your veins:    Leg swelling:         Pulmonary    Oxygen at home:    Productive cough:     Wheezing:         Neurologic    Sudden weakness in arms or legs:     Sudden numbness in arms or legs:     Sudden onset of difficulty speaking or slurred speech:    Temporary loss of vision in one eye:     Problems with dizziness:         Gastrointestinal    Blood in stool:     Vomited blood:         Genitourinary    Burning when urinating:     Blood in urine:        Psychiatric    Major depression:         Hematologic    Bleeding problems:    Problems with blood clotting too easily:        Skin    Rashes or ulcers:        Constitutional    Fever or chills:      PHYSICAL EXAMINATION:  Vitals:   07/24/20 1121  BP: (!) 147/84  Pulse: 74  Resp: 20  Temp: 97.7 F (36.5 C)  TempSrc: Temporal  SpO2: 99%  Weight: 240 lb 12.8 oz (109.2 kg)  Height: 5' 1.5" (1.562 m)    General:  WDWN in NAD; vital signs documented above Gait: Not observed HENT: WNL, normocephalic Pulmonary: normal non-labored breathing Cardiac: regular HR Abdomen: soft, NT, no masses Skin: without rashes Vascular Exam/Pulses:  Right Left  Radial 2+ (normal) 2+ (normal)  DP 2+ (normal) 2+ (normal)   Extremities: Prominent veins right shin; varicosity noted right calf Musculoskeletal: no muscle wasting or atrophy  Neurologic: A&O X 3;  No focal weakness or paresthesias are detected Psychiatric:  The pt has Normal affect.   Non-Invasive Vascular Imaging:   Right lower extremity reflux study  negative for DVT Negative for deep venous reflux Reflux noted in saphenofemoral junction however diameter of greater saphenous vein throughout the thigh is less than 3 mm    ASSESSMENT/PLAN:: 69 y.o. female here for right leg pain, swelling, prominent veins  -Right lower extremity venous reflux study negative for DVT; only mild superficial reflux however greater saphenous vein diameter is less than 3 mm throughout the thigh -Recommended knee-high 15 to 20 mmHg compression stockings; patient   opted to purchase these elsewhere -Also recommended periodic elevation of her legs during the day and demonstrated proper form -She should also avoid prolonged sitting and standing; she can use NSAIDs for venous symptoms -Patient can follow-up on an as-needed basis   Dagoberto Ligas, PA-C [facility] [phone]  Clinic MD:   Scot Dock

## 2020-07-29 DIAGNOSIS — U071 COVID-19: Secondary | ICD-10-CM | POA: Diagnosis not present

## 2020-07-31 DIAGNOSIS — U071 COVID-19: Secondary | ICD-10-CM | POA: Diagnosis not present

## 2020-08-05 DIAGNOSIS — U071 COVID-19: Secondary | ICD-10-CM | POA: Diagnosis not present

## 2020-08-11 DIAGNOSIS — H43812 Vitreous degeneration, left eye: Secondary | ICD-10-CM | POA: Diagnosis not present

## 2020-08-11 DIAGNOSIS — D3132 Benign neoplasm of left choroid: Secondary | ICD-10-CM | POA: Diagnosis not present

## 2020-08-11 DIAGNOSIS — Z961 Presence of intraocular lens: Secondary | ICD-10-CM | POA: Diagnosis not present

## 2020-08-12 DIAGNOSIS — M9906 Segmental and somatic dysfunction of lower extremity: Secondary | ICD-10-CM | POA: Diagnosis not present

## 2020-08-12 DIAGNOSIS — H43392 Other vitreous opacities, left eye: Secondary | ICD-10-CM | POA: Diagnosis not present

## 2020-08-12 DIAGNOSIS — M9903 Segmental and somatic dysfunction of lumbar region: Secondary | ICD-10-CM | POA: Diagnosis not present

## 2020-08-12 DIAGNOSIS — M9904 Segmental and somatic dysfunction of sacral region: Secondary | ICD-10-CM | POA: Diagnosis not present

## 2020-08-12 DIAGNOSIS — M9905 Segmental and somatic dysfunction of pelvic region: Secondary | ICD-10-CM | POA: Diagnosis not present

## 2020-08-12 DIAGNOSIS — M25552 Pain in left hip: Secondary | ICD-10-CM | POA: Diagnosis not present

## 2020-08-28 ENCOUNTER — Other Ambulatory Visit: Payer: Self-pay | Admitting: Physician Assistant

## 2020-09-22 ENCOUNTER — Other Ambulatory Visit: Payer: Self-pay | Admitting: Physician Assistant

## 2020-09-22 DIAGNOSIS — H9203 Otalgia, bilateral: Secondary | ICD-10-CM

## 2020-09-22 DIAGNOSIS — J019 Acute sinusitis, unspecified: Secondary | ICD-10-CM

## 2020-09-23 ENCOUNTER — Other Ambulatory Visit: Payer: Self-pay | Admitting: *Deleted

## 2020-09-23 DIAGNOSIS — H43812 Vitreous degeneration, left eye: Secondary | ICD-10-CM | POA: Diagnosis not present

## 2020-09-23 DIAGNOSIS — Z961 Presence of intraocular lens: Secondary | ICD-10-CM | POA: Diagnosis not present

## 2020-09-23 DIAGNOSIS — D3132 Benign neoplasm of left choroid: Secondary | ICD-10-CM | POA: Diagnosis not present

## 2020-09-23 LAB — HM DIABETES EYE EXAM

## 2020-09-23 MED ORDER — PREGABALIN 50 MG PO CAPS: 50.0000 mg | ORAL_CAPSULE | Freq: Two times a day (BID) | ORAL | 0 refills | Status: DC

## 2020-09-26 ENCOUNTER — Ambulatory Visit: Payer: Medicare HMO

## 2020-09-26 ENCOUNTER — Other Ambulatory Visit: Payer: Self-pay

## 2020-09-26 NOTE — Telephone Encounter (Signed)
  LAST APPOINTMENT DATE: 3/22/222  NEXT APPOINTMENT DATE:@6 /24/2022  MEDICATION: doxepin (SINEQUAN) 10 MG capsule  PHARMACY: [facility] - [place], [place] has sent a request for this to be refilled and has not had a response.

## 2020-09-29 NOTE — Telephone Encounter (Signed)
Left message on voicemail to call office.  

## 2020-10-01 MED ORDER — DOXEPIN HCL 10 MG PO CAPS: 10.0000 mg | ORAL_CAPSULE | Freq: Every day | ORAL | 0 refills | Status: DC

## 2020-10-01 NOTE — Telephone Encounter (Signed)
Pt requesting refill for Doxepin 10 mg capsule once daily was prescribed by old PCP. Last OV 06/2020.

## 2020-10-01 NOTE — Telephone Encounter (Signed)
Spoke to pt told her received fax from mail order pharmacy for refill for Doxepin 10 mg and Fluconazole. Told her Aldona Bar has never filled Doxepin who filled it before? Pt said it was her old PCP and she couple of medications from him yet. Told her can not fill Fluconazole thru mail order and we have not prescribed it. Pt verbalized understanding and does not need it at present. Asked pt if she needs any other Rx's filled at this time? Pt said no. Told her okay I will send request to Dr Jerline Pain to have him fill and you need to schedule a follow up for August when Aldona Bar is back. Pt verbalized understanding and will call back to schedule.

## 2020-10-01 NOTE — Addendum Note (Signed)
Addended by: Marian Sorrow on: 10/01/2020 09:21 AM   Modules accepted: Orders

## 2020-10-09 ENCOUNTER — Ambulatory Visit: Payer: Medicare HMO

## 2020-10-18 ENCOUNTER — Other Ambulatory Visit: Payer: Self-pay | Admitting: Physician Assistant

## 2020-10-24 ENCOUNTER — Other Ambulatory Visit: Payer: Self-pay | Admitting: Sports Medicine

## 2020-10-24 ENCOUNTER — Ambulatory Visit
Admission: RE | Admit: 2020-10-24 | Discharge: 2020-10-24 | Disposition: A | Discharge status: Home / Self Care | Payer: Medicare HMO | Source: Ambulatory Visit | Attending: Sports Medicine | Admitting: Sports Medicine

## 2020-10-24 ENCOUNTER — Other Ambulatory Visit: Payer: Self-pay

## 2020-10-24 DIAGNOSIS — M9904 Segmental and somatic dysfunction of sacral region: Secondary | ICD-10-CM | POA: Diagnosis not present

## 2020-10-24 DIAGNOSIS — M545 Low back pain, unspecified: Secondary | ICD-10-CM

## 2020-10-24 DIAGNOSIS — S0232XS Fracture of orbital floor, left side, sequela: Secondary | ICD-10-CM | POA: Diagnosis not present

## 2020-10-24 DIAGNOSIS — S060X0D Concussion without loss of consciousness, subsequent encounter: Secondary | ICD-10-CM | POA: Diagnosis not present

## 2020-10-24 DIAGNOSIS — M9903 Segmental and somatic dysfunction of lumbar region: Secondary | ICD-10-CM | POA: Diagnosis not present

## 2020-10-24 DIAGNOSIS — M9906 Segmental and somatic dysfunction of lower extremity: Secondary | ICD-10-CM | POA: Diagnosis not present

## 2020-10-24 DIAGNOSIS — G44309 Post-traumatic headache, unspecified, not intractable: Secondary | ICD-10-CM | POA: Diagnosis not present

## 2020-10-24 DIAGNOSIS — H43392 Other vitreous opacities, left eye: Secondary | ICD-10-CM | POA: Diagnosis not present

## 2020-10-24 DIAGNOSIS — Z7901 Long term (current) use of anticoagulants: Secondary | ICD-10-CM | POA: Diagnosis not present

## 2020-10-24 DIAGNOSIS — M25552 Pain in left hip: Secondary | ICD-10-CM | POA: Diagnosis not present

## 2020-10-28 ENCOUNTER — Other Ambulatory Visit: Payer: Self-pay | Admitting: Sports Medicine

## 2020-10-28 DIAGNOSIS — G4489 Other headache syndrome: Secondary | ICD-10-CM

## 2020-11-03 ENCOUNTER — Other Ambulatory Visit: Payer: Self-pay | Admitting: *Deleted

## 2020-11-03 MED ORDER — CYCLOBENZAPRINE HCL 5 MG PO TABS
5.0000 mg | ORAL_TABLET | Freq: Every evening | ORAL | 0 refills | Status: DC | PRN
Start: 2020-11-03 — End: 2021-09-18

## 2020-11-18 ENCOUNTER — Ambulatory Visit
Admission: RE | Admit: 2020-11-18 | Discharge: 2020-11-18 | Disposition: A | Discharge status: Home / Self Care | Payer: Medicare HMO | Source: Ambulatory Visit | Attending: Sports Medicine | Admitting: Sports Medicine

## 2020-11-18 DIAGNOSIS — G4489 Other headache syndrome: Secondary | ICD-10-CM

## 2020-11-20 ENCOUNTER — Other Ambulatory Visit: Payer: Self-pay | Admitting: Physician Assistant

## 2020-11-26 ENCOUNTER — Other Ambulatory Visit: Payer: Self-pay | Admitting: Orthopedic Surgery

## 2020-11-26 DIAGNOSIS — M545 Low back pain, unspecified: Secondary | ICD-10-CM

## 2020-11-27 ENCOUNTER — Other Ambulatory Visit: Payer: Self-pay | Admitting: Orthopedic Surgery

## 2020-11-27 DIAGNOSIS — M545 Low back pain, unspecified: Secondary | ICD-10-CM

## 2020-12-13 ENCOUNTER — Ambulatory Visit
Admission: RE | Admit: 2020-12-13 | Discharge: 2020-12-13 | Disposition: A | Discharge status: Home / Self Care | Payer: Medicare HMO | Source: Ambulatory Visit | Attending: Orthopedic Surgery | Admitting: Orthopedic Surgery

## 2020-12-13 DIAGNOSIS — M545 Low back pain, unspecified: Secondary | ICD-10-CM

## 2020-12-15 ENCOUNTER — Other Ambulatory Visit: Payer: Self-pay

## 2020-12-15 ENCOUNTER — Ambulatory Visit
Admission: RE | Admit: 2020-12-15 | Discharge: 2020-12-15 | Disposition: A | Discharge status: Home / Self Care | Payer: Medicare HMO | Source: Ambulatory Visit | Attending: Orthopedic Surgery | Admitting: Orthopedic Surgery

## 2020-12-15 DIAGNOSIS — M545 Low back pain, unspecified: Secondary | ICD-10-CM

## 2020-12-15 DIAGNOSIS — M48061 Spinal stenosis, lumbar region without neurogenic claudication: Secondary | ICD-10-CM | POA: Diagnosis not present

## 2020-12-23 DIAGNOSIS — M5416 Radiculopathy, lumbar region: Secondary | ICD-10-CM | POA: Diagnosis not present

## 2020-12-26 DIAGNOSIS — Z6841 Body Mass Index (BMI) 40.0 and over, adult: Secondary | ICD-10-CM | POA: Diagnosis not present

## 2020-12-26 DIAGNOSIS — M48062 Spinal stenosis, lumbar region with neurogenic claudication: Secondary | ICD-10-CM | POA: Diagnosis not present

## 2020-12-28 ENCOUNTER — Other Ambulatory Visit: Payer: Self-pay | Admitting: Family Medicine

## 2020-12-28 ENCOUNTER — Other Ambulatory Visit: Payer: Self-pay | Admitting: Physician Assistant

## 2020-12-29 DIAGNOSIS — M75121 Complete rotator cuff tear or rupture of right shoulder, not specified as traumatic: Secondary | ICD-10-CM | POA: Diagnosis not present

## 2020-12-30 ENCOUNTER — Other Ambulatory Visit: Payer: Self-pay | Admitting: *Deleted

## 2020-12-30 MED ORDER — METFORMIN HCL 500 MG PO TABS: ORAL_TABLET | ORAL | 1 refills | Status: DC

## 2021-01-08 DIAGNOSIS — M48062 Spinal stenosis, lumbar region with neurogenic claudication: Secondary | ICD-10-CM | POA: Diagnosis not present

## 2021-01-15 ENCOUNTER — Other Ambulatory Visit: Payer: Self-pay

## 2021-01-15 MED ORDER — PREGABALIN 50 MG PO CAPS: 50.0000 mg | ORAL_CAPSULE | Freq: Two times a day (BID) | ORAL | 1 refills | Status: DC

## 2021-01-15 NOTE — Telephone Encounter (Signed)
[employer] script got lost in the mail. Verbal orders given for OK to send new script. Requesting 30 day script to be sent to [facility].

## 2021-01-16 DIAGNOSIS — S060X0D Concussion without loss of consciousness, subsequent encounter: Secondary | ICD-10-CM | POA: Diagnosis not present

## 2021-01-16 DIAGNOSIS — M19011 Primary osteoarthritis, right shoulder: Secondary | ICD-10-CM | POA: Diagnosis not present

## 2021-01-16 DIAGNOSIS — M542 Cervicalgia: Secondary | ICD-10-CM | POA: Diagnosis not present

## 2021-01-16 DIAGNOSIS — M9901 Segmental and somatic dysfunction of cervical region: Secondary | ICD-10-CM | POA: Diagnosis not present

## 2021-01-16 DIAGNOSIS — M7521 Bicipital tendinitis, right shoulder: Secondary | ICD-10-CM | POA: Diagnosis not present

## 2021-01-16 DIAGNOSIS — M25511 Pain in right shoulder: Secondary | ICD-10-CM | POA: Diagnosis not present

## 2021-01-16 DIAGNOSIS — R531 Weakness: Secondary | ICD-10-CM | POA: Diagnosis not present

## 2021-01-16 DIAGNOSIS — M9907 Segmental and somatic dysfunction of upper extremity: Secondary | ICD-10-CM | POA: Diagnosis not present

## 2021-01-20 ENCOUNTER — Ambulatory Visit: Payer: Medicare HMO

## 2021-01-21 ENCOUNTER — Other Ambulatory Visit: Payer: Self-pay | Admitting: Family Medicine

## 2021-01-22 ENCOUNTER — Ambulatory Visit (INDEPENDENT_AMBULATORY_CARE_PROVIDER_SITE_OTHER): Payer: Medicare HMO

## 2021-01-22 DIAGNOSIS — Z23 Encounter for immunization: Secondary | ICD-10-CM | POA: Diagnosis not present

## 2021-01-26 DIAGNOSIS — M19211 Secondary osteoarthritis, right shoulder: Secondary | ICD-10-CM | POA: Diagnosis not present

## 2021-01-27 ENCOUNTER — Other Ambulatory Visit: Payer: Self-pay | Admitting: Orthopedic Surgery

## 2021-01-27 DIAGNOSIS — Z01811 Encounter for preprocedural respiratory examination: Secondary | ICD-10-CM

## 2021-01-27 NOTE — Progress Notes (Signed)
Sent message, via epic in basket, requesting orders in epic from surgeon.  

## 2021-01-28 NOTE — Patient Instructions (Addendum)
DUE TO COVID-19 ONLY ONE VISITOR IS ALLOWED TO COME WITH YOU AND STAY IN THE WAITING ROOM ONLY DURING PRE OP AND PROCEDURE.   **NO VISITORS ARE ALLOWED IN THE SHORT STAY AREA OR RECOVERY ROOM!!**  IF YOU WILL BE ADMITTED INTO THE HOSPITAL YOU ARE ALLOWED ONLY TWO SUPPORT PEOPLE DURING VISITATION HOURS ONLY (10AM -8PM)   The support person(s) may change daily. The support person(s) must pass our screening, gel in and out, and wear a mask at all times, including in the patient's room. Patients must also wear a mask when staff or their support person are in the room.  No visitors under the age of 63. Any visitor under the age of 60 must be accompanied by an adult.    COVID SWAB TESTING MUST BE COMPLETED ON:  02/10/21 **MUST PRESENT COMPLETED FORM AT TESTING SITE**    [facility] (backside of the building) Open 8am-3pm. No appointment needed. You are not required to quarantine, however you are required to wear a well-fitted mask when you are out and around people not in your household.  Hand Hygiene often Do NOT share personal items Notify your provider if you are in close contact with someone who has COVID or you develop fever 100.4 or greater, new onset of sneezing, cough, sore throat, shortness of breath or body aches.       Your procedure is scheduled on: 02/12/21   Report to [facility] Main Entrance    Report to admitting at 7:00 AM   Call this number if you have problems the morning of surgery [phone]   Do not eat food :After Midnight.   May have liquids until 6:40 AM day of surgery  CLEAR LIQUID DIET  Foods Allowed                                                                     Foods Excluded  Water, Black Coffee and tea (no milk or creamer)           liquids that you cannot  Plain Jell-O in any flavor  (No red)                                    see through such as: Fruit ices (not with fruit pulp)                                             milk, soups, orange juice              Iced Popsicles (No red)                                               All solid food  Apple juices Sports drinks like Gatorade (No red) Lightly seasoned clear broth or consume(fat free) Sugar     The day of surgery:  Drink ONE (1) Pre-Surgery G2 by 6:40 am the morning of surgery. Drink in one sitting. Do not sip.  This drink was given to you during your hospital  pre-op appointment visit. Nothing else to drink after completing the  Pre-Surgery G2.          If you have questions, please contact your surgeon's office.     Oral Hygiene is also important to reduce your risk of infection.                                    Remember - BRUSH YOUR TEETH THE MORNING OF SURGERY WITH YOUR REGULAR TOOTHPASTE   Take these medicines the morning of surgery with A SIP OF WATER: Allopurinol, Zyrtec, Doxepin, Synthroid, Omeprazole, Lyrica, Zoloft, Prednisone, Flomax  DO NOT TAKE ANY ORAL DIABETIC MEDICATIONS DAY OF YOUR SURGERY  How to Manage Your Diabetes Before and After Surgery  Why is it important to control my blood sugar before and after surgery? Improving blood sugar levels before and after surgery helps healing and can limit problems. A way of improving blood sugar control is eating a healthy diet by:  Eating less sugar and carbohydrates  Increasing activity/exercise  Talking with your doctor about reaching your blood sugar goals High blood sugars (greater than 180 mg/dL) can raise your risk of infections and slow your recovery, so you will need to focus on controlling your diabetes during the weeks before surgery. Make sure that the doctor who takes care of your diabetes knows about your planned surgery including the date and location.  How do I manage my blood sugar before surgery? Check your blood sugar at least 4 times a day, starting 2 days before surgery, to make sure that the level is not too high or  low. Check your blood sugar the morning of your surgery when you wake up and every 2 hours until you get to the Short Stay unit. If your blood sugar is less than 70 mg/dL, you will need to treat for low blood sugar: Do not take insulin. Treat a low blood sugar (less than 70 mg/dL) with  cup of clear juice (cranberry or apple), 4 glucose tablets, OR glucose gel. Recheck blood sugar in 15 minutes after treatment (to make sure it is greater than 70 mg/dL). If your blood sugar is not greater than 70 mg/dL on recheck, call [phone] for further instructions. Report your blood sugar to the short stay nurse when you get to Short Stay.  If you are admitted to the hospital after surgery: Your blood sugar will be checked by the staff and you will probably be given insulin after surgery (instead of oral diabetes medicines) to make sure you have good blood sugar levels. The goal for blood sugar control after surgery is 80-180 mg/dL.   WHAT DO I DO ABOUT MY DIABETES MEDICATION?  Do not take oral diabetes medicines (pills) the morning of surgery.  THE DAY BEFORE SURGERY, take Metformin and Tresiba as prescribed       THE MORNING OF SURGERY, do not take Metformin. Take 50% (13 units) of [place].   Reviewed and Endorsed by [facility] Patient Education Committee, August 2015  You may not have any metal on your body including hair pins, jewelry, and body piercing             Do not wear make-up, lotions, powders, perfumes, or deodorant  Do not wear nail polish including gel and S&S, artificial/acrylic nails, or any other type of covering on natural nails including finger and toenails. If you have artificial nails, gel coating, etc. that needs to be removed by a nail salon please have this removed prior to surgery or surgery may need to be canceled/ delayed if the surgeon/ anesthesia feels like they are unable to be safely monitored.   Do not shave  48 hours prior to  surgery.    Do not bring valuables to the hospital. [place].   Bring small overnight bag day of surgery.   Please read over the following fact sheets you were given: IF YOU HAVE QUESTIONS ABOUT YOUR PRE-OP INSTRUCTIONS PLEASE CALL [place] - Preparing for Surgery Before surgery, you can play an important role.  Because skin is not sterile, your skin needs to be as free of germs as possible.  You can reduce the number of germs on your skin by washing with CHG (chlorahexidine gluconate) soap before surgery.  CHG is an antiseptic cleaner which kills germs and bonds with the skin to continue killing germs even after washing. Please DO NOT use if you have an allergy to CHG or antibacterial soaps.  If your skin becomes reddened/irritated stop using the CHG and inform your nurse when you arrive at Short Stay. Do not shave (including legs and underarms) for at least 48 hours prior to the first CHG shower.  You may shave your face/neck.  Please follow these instructions carefully:  1.  Shower with CHG Soap the night before surgery and the  morning of surgery.  2.  If you choose to wash your hair, wash your hair first as usual with your normal  shampoo.  3.  After you shampoo, rinse your hair and body thoroughly to remove the shampoo.                             4.  Use CHG as you would any other liquid soap.  You can apply chg directly to the skin and wash.  Gently with a scrungie or clean washcloth.  5.  Apply the CHG Soap to your body ONLY FROM THE NECK DOWN.   Do   not use on face/ open                           Wound or open sores. Avoid contact with eyes, ears mouth and   genitals (private parts).                       Wash face,  Genitals (private parts) with your normal soap.             6.  Wash thoroughly, paying special attention to the area where your    surgery  will be performed.  7.  Thoroughly rinse your body  with warm water from the neck down.  8.  DO NOT shower/wash with your normal soap after using and rinsing off the CHG Soap.  9.  Pat yourself dry with a clean towel.            10.  Wear clean pajamas.            11.  Place clean sheets on your bed the night of your first shower and do not  sleep with pets. Day of Surgery : Do not apply any lotions/deodorants the morning of surgery.  Please wear clean clothes to the hospital/surgery center.  FAILURE TO FOLLOW THESE INSTRUCTIONS MAY RESULT IN THE CANCELLATION OF YOUR SURGERY  PATIENT SIGNATURE_________________________________  NURSE SIGNATURE__________________________________  ________________________________________________________________________  [facility]- Preparing for Total Shoulder Arthroplasty    Before surgery, you can play an important role. Because skin is not sterile, your skin needs to be as free of germs as possible. You can reduce the number of germs on your skin by using the following products. Benzoyl Peroxide Gel Reduces the number of germs present on the skin Applied twice a day to shoulder area starting two days before surgery    ==================================================================  Please follow these instructions carefully:  BENZOYL PEROXIDE 5% GEL  Please do not use if you have an allergy to benzoyl peroxide.   If your skin becomes reddened/irritated stop using the benzoyl peroxide.  Starting two days before surgery, apply as follows: Apply benzoyl peroxide in the morning and at night. Apply after taking a shower. If you are not taking a shower clean entire shoulder front, back, and side along with the armpit with a clean wet washcloth.  Place a quarter-sized dollop on your shoulder and rub in thoroughly, making sure to cover the front, back, and side of your shoulder, along with the armpit.   2 days before ____ AM   ____ PM              1 day before ____ AM   ____ PM                          Do this twice a day for two days.  (Last application is the night before surgery, AFTER using the CHG soap as described below).  Do NOT apply benzoyl peroxide gel on the day of surgery.

## 2021-01-28 NOTE — Progress Notes (Addendum)
COVID swab appointment:02/10/21  COVID Vaccine Completed: yes x3 Date COVID Vaccine completed: 06/14/19, 07/10/19 Has received booster: 12/21/19 COVID vaccine manufacturer: [employer]      Date of COVID positive in last 90 days: no  PCP - Inda Coke, PA Cardiologist - Buford Dresser, MD  Chest x-ray - 01/29/21 Epic EKG - 01/29/21 Epic Stress Test - years ago per pt ECHO - 02/15/20 Epic Cardiac Cath - 2001 Pacemaker/ICD device last checked: n/a Spinal Cord Stimulator: n/a  Sleep Study - yes, mild CPAP - no  Fasting Blood Sugar - 70-120 Checks Blood Sugar 1_ times a day  Blood Thinner Instructions: Aspirin Instructions: ASA 81, no instructions. Patient seeing PCP tomorrow and will ask Last Dose:  Activity level: Can perform activities of daily living without stopping and without symptoms of chest pain or shortness of breath. Struggle with stairs due to knees    Anesthesia review: DM, HTN, RBBB, bifascicular block, OSA  Patient denies shortness of breath, fever, cough and chest pain at PAT appointment   Patient verbalized understanding of instructions that were given to them at the PAT appointment. Patient was also instructed that they will need to review over the PAT instructions again at home before surgery.

## 2021-01-28 NOTE — Progress Notes (Signed)
Brittany [facility] is a 70 y.o. female here for a follow up.  History of Present Illness:   Chief Complaint  Patient presents with   Surgical Clearnce    Pt is scheduled for surgery on 02/12/2021 for her Right Shoulder.    HPI  Pt presents today with her husband for surgical clearance. She is scheduled to undergo a reverse shoulder arthroplasty on her right shoulder on 02/12/21.   Limited Ambulation Brittany [facility] has expressed she occasionally experiences swelling in her right ankle. Brittany [facility] reports that her trouble walking is due to her balance. Denies SOB upon exertion.    Right Shoulder Pain Brittany [facility] has been seeing Brittany. Paulla Fore, Sports medicine prior to decided on undergoing her scheduled surgery. Brittany Acosta states Brittany. Paulla Fore provided alternatives to surgery such as a topical gel, plasma replacement, and a transdermal analgesic in an effort to avoid surgery as much as possible. Following her visit with Brittany. Tamera Punt, orthopedics, and receiving his evidence that her shoulder damage is extensive, she decided to move forth with surgery.   Brittany [facility] is interested in receiving a referral to PT for her back as well as post op for her right shoulder.   Diabetes Currently compliant with taking metformin 500 mg with no adverse effects. Denies hypoglycemic or hyperglycemic episodes or symptoms. She is managing well.   Lab Results  Component Value Date   HGBA1C 6.7 (H) 01/29/2021    HTN Currently compliant with taking lisinopril 5 mg with no adverse effects . Patient denies chest pain, SOB, blurred vision, dizziness, or unusual headaches. Denies excessive caffeine intake, stimulant usage, excessive alcohol intake, or increase in salt consumption.  BP Readings from Last 3 Encounters:  01/30/21 130/70  01/29/21 (!) 165/72  07/24/20 (!) 147/84    Past Medical History:  Diagnosis Date   Allergic rhinitis, cause unspecified    Arthritis    Bipolar disorder, unspecified (Brittany Acosta)    depression   Cancer (Brittany Acosta)     skin cancer on left leg, left breast cancer   Chronic diastolic heart failure (HCC)    Depression    GERD (gastroesophageal reflux disease)    Headache    History of gout    "haven't had it in several years" (02/08/2012)   History of kidney stones    History of wrist fracture    rt wrist   Hypertension    Iron deficiency anemia    Kidney stones    sees urologist @ [facility]   Morbid obesity ([place])    Neuropathy due to secondary diabetes (Brittany [facility]osta)    Nontoxic multinodular goiter    Osteoporosis    Pneumonia    PONV (postoperative nausea and vomiting)    PTSD (post-traumatic stress disorder)    Pure hypercholesterolemia    RBBB    Scleroderma ([place])    Sleep apnea 10/2012   mild osa-did not need cpap -Brittany clance   Type II diabetes mellitus (Brittany [facility])    Unspecified hypothyroidism      Social History   Tobacco Use   Smoking status: Former    Packs/day: 1.00    Years: 6.00    Pack years: 6.00    Types: Cigarettes    Quit date: 04/06/1983    Years since quitting: 37.8   Smokeless tobacco: Never   Tobacco comments:    Married, lives with spouse. Disable- 2 grown kids-6 g-kids  Vaping Use   Vaping Use: Never used  Substance Use Topics   Alcohol use: No  Comment: none   Drug use: No    Past Surgical History:  Procedure Laterality Date   ABDOMINAL HYSTERECTOMY  1976   BREAST EXCISIONAL BIOPSY Left    BREAST LUMPECTOMY WITH RADIOACTIVE SEED AND SENTINEL LYMPH NODE BIOPSY Left 04/25/2018   Procedure: LEFT BREAST LUMPECTOMY WITH RADIOACTIVE SEED AND SENTINEL LYMPH NODE BIOPSY;  Surgeon: Excell Seltzer, MD;  Location: [facility];  Service: General;  Laterality: Left;   [place]  2001   sees Brittany [facility]   CATARACT EXTRACTION  2014   CATARACT EXTRACTION, BILATERAL  02/2011   epps   CHOLECYSTECTOMY  1985   EXCISIONAL HEMORRHOIDECTOMY     "Brittany cut out in his office" (02/08/2012)   [place]   right   JOINT REPLACEMENT     rt  knee   KNEE ARTHROSCOPY  08/2009   right   KNEE ARTHROSCOPY WITH MEDIAL MENISECTOMY     left   Left Cystoscopy   [facility]   Lithotripsy (L) Kidney  1997   LUMBAR LAMINECTOMY/DECOMPRESSION MICRODISCECTOMY Left 07/01/2016   Procedure: Laminectomy for synovial cyst - left - Lumbar four-five;  Surgeon: Brittany Larsson, MD;  Location: [facility];  Service: Neurosurgery;  Laterality: Left;   PARTIAL MASTECTOMY WITH NEEDLE LOCALIZATION Left 09/01/2012   Procedure: PARTIAL MASTECTOMY WITH NEEDLE LOCALIZATION;  Surgeon: Brittany Hector, MD;  Location: [facility];  Service: General;  Laterality: Left;   Percitania stone removed (L) Kidney  1992   Right nasal surgery  08/1988   Right sinus removed  08/1989   tooth partial   ROTATOR CUFF REPAIR  2013   right shoulder x 2   SHOULDER ARTHROSCOPY WITH ROTATOR CUFF REPAIR AND SUBACROMIAL DECOMPRESSION Left 08/20/2013   Procedure: SHOULDER ARTHROSCOPY  AND SUBACROMIAL DECOMPRESSION;  Surgeon: Brittany Sells, MD;  Location: [facility];  Service: Orthopedics;  Laterality: Left;  Left shoulder arthroscopy, debridement, subacromial decompression, distal clavical resection   THYROIDECTOMY  04/22/2011   Procedure: THYROIDECTOMY;  Surgeon: Brittany Graham, MD;  Location: [facility];  Service: ENT;  Laterality: N/A;  TOTAL THYROIDECOTMY   TOTAL KNEE ARTHROPLASTY  06/18/2011   Procedure: TOTAL KNEE ARTHROPLASTY;  Surgeon: Brittany Salen, MD;  Location: [facility] [facility];  Service: Orthopedics;  Laterality: Left;  DEPUY   TOTAL KNEE ARTHROPLASTY  02/07/2012   Procedure: TOTAL KNEE ARTHROPLASTY;  Surgeon: Brittany Salen, MD;  Location: [facility];  Service: Orthopedics;  Laterality: Right;   TUBAL LIGATION  1972    Family History  Problem Relation Age of Onset   Hypertension Mother    Lung cancer Mother        non-smoker   Diabetes Father    Hypertension Father    Hyperlipidemia Father    Heart attack Other    Coronary artery disease Other    Hypertension  Sister    Hypertension Brother    Hypertension Brother    Hypertension Brother    Hypertension Sister    Hypertension Sister    Diabetes Daughter     No Known Allergies  Current Medications:   Current Outpatient Medications:    allopurinol (ZYLOPRIM) 300 MG tablet, TAKE 1 TABLET (300 MG TOTAL) BY MOUTH DAILY., Disp: 90 tablet, Rfl: 0   anastrozole (ARIMIDEX) 1 MG tablet, TAKE 1 TABLET EVERY DAY, Disp: 90 tablet, Rfl: 0   aspirin EC 81 MG tablet, Take 1 tablet (81 mg total) by  mouth daily. Swallow whole., Disp: 90 tablet, Rfl: 3   Biotin 10 MG CAPS, Take 10 mg by mouth at bedtime., Disp: , Rfl:    Blood Glucose Monitoring Suppl (ACCU-CHEK AVIVA PLUS) w/Device KIT, USE TO CHECK BLOOD SUGAR 2 TIMES PER DAY. (Patient taking differently: 1 each by Other route See admin instructions. USE TO CHECK BLOOD SUGAR 2 TIMES PER DAY.), Disp: 1 kit, Rfl: 2   cetirizine (ZYRTEC) 10 MG tablet, Take 20 mg by mouth daily. , Disp: , Rfl:    Cholecalciferol (VITAMIN D3) 50 MCG (2000 UT) TABS, Take 2,000 Units by mouth daily. , Disp: , Rfl:    cyclobenzaprine (FLEXERIL) 5 MG tablet, Take 1 tablet (5 mg total) by mouth at bedtime as needed for muscle spasms., Disp: 90 tablet, Rfl: 0   doxepin (SINEQUAN) 10 MG capsule, Take 1 capsule (10 mg total) by mouth daily., Disp: 90 capsule, Rfl: 0   fluticasone (FLONASE) 50 MCG/ACT nasal spray, USE 2 SPRAYS IN EACH NOSTRIL EVERY DAY, Disp: 48 g, Rfl: 1   folic acid (FOLVITE) 1 MG tablet, Take 2 mg by mouth daily., Disp: , Rfl:    furosemide (LASIX) 40 MG tablet, Take 1 tablet (40 mg total) by mouth daily., Disp: 90 tablet, Rfl: 3   glucose blood (ACCU-CHEK AVIVA PLUS) test strip, USE TO CHECK BLOOD SUGAR TWO TIMES PER DAY. (Patient taking differently: 1 each by Other route See admin instructions. USE TO CHECK BLOOD SUGAR TWO TIMES PER DAY.), Disp: 200 each, Rfl: 3   Insulin Pen Needle (DROPLET PEN NEEDLES) 31G X 5 MM MISC, USE TO INJECT INSULIN DAILY, Disp: 100 each, Rfl:  3   Insulin Syringe-Needle U-100 (INSULIN SYRINGE 1CC/31GX5/16") 31G X 5/16" 1 ML MISC, Use to inject insulin two times per day. (Patient taking differently: 1 each by Other route See admin instructions. Use to inject insulin two times per day.), Disp: 200 each, Rfl: 2   lamoTRIgine (LAMICTAL) 200 MG tablet, Take 1 tablet (200 mg total) by mouth daily at 12 noon., Disp: 90 tablet, Rfl: 3   Lancets (ACCU-CHEK MULTICLIX) lancets, Use to check blood sugar two time per day. (Patient taking differently: 1 each by Other route in the morning and at bedtime. Use to check blood sugar two time per day.), Disp: 200 each, Rfl: 2   levothyroxine (SYNTHROID, LEVOTHROID) 50 MCG tablet, Take 1 tablet (50 mcg total) by mouth daily. --- Office visit needed for further refills, Disp: 90 tablet, Rfl: 0   lisinopril (ZESTRIL) 5 MG tablet, TAKE 1 TABLET EVERY DAY, Disp: 90 tablet, Rfl: 1   metFORMIN (GLUCOPHAGE) 500 MG tablet, Take 1000 mg in the AM and 500 mg in the PM., Disp: 360 tablet, Rfl: 1   methotrexate 2.5 MG tablet, Take 20 mg by mouth every Saturday., Disp: , Rfl:    Multiple Vitamin (MULTIVITAMIN WITH MINERALS) TABS tablet, Take 1 tablet by mouth in the morning and at bedtime., Disp: , Rfl:    omeprazole (PRILOSEC) 20 MG capsule, Take 1 capsule (20 mg total) by mouth daily. Take 30 minutes before a meal, Disp: 90 capsule, Rfl: 3   oxybutynin (DITROPAN-XL) 10 MG 24 hr tablet, Take 1 tablet (10 mg total) by mouth every evening. (Patient taking differently: Take 10 mg by mouth at bedtime.), Disp: 90 tablet, Rfl: 3   potassium citrate (UROCIT-K) 10 MEQ (1080 MG) SR tablet, 10 mEq., Disp: , Rfl:    pravastatin (PRAVACHOL) 20 MG tablet, Take 20 mg by mouth at  bedtime., Disp: , Rfl:    predniSONE (DELTASONE) 5 MG tablet, Take 5 mg by mouth daily with breakfast. , Disp: , Rfl:    pregabalin (LYRICA) 50 MG capsule, Take 1 capsule (50 mg total) by mouth 2 (two) times daily., Disp: 180 capsule, Rfl: 1   saw palmetto 500  MG capsule, Take 500 mg by mouth daily., Disp: , Rfl:    sertraline (ZOLOFT) 100 MG tablet, Take 100 mg by mouth daily. , Disp: , Rfl:    sulfaSALAzine (AZULFIDINE) 500 MG tablet, Take 1,000 mg by mouth daily., Disp: , Rfl:    tamsulosin (FLOMAX) 0.4 MG CAPS capsule, Take 0.4 mg by mouth daily., Disp: , Rfl:    TRESIBA FLEXTOUCH 100 UNIT/ML FlexTouch Pen, INJECT 26 UNITS SUBCUTANEOUSLY DAILY, Disp: 30 mL, Rfl: 1   vitamin A 3 MG (10000 UNITS) capsule, Take 10,000 Units by mouth daily., Disp: , Rfl:    Review of Systems:   ROS Negative unless otherwise specified per HPI. Vitals:   Vitals:   01/30/21 0806  BP: 130/70  Pulse: 79  Temp: (!) 97.3 F (36.3 C)  TempSrc: Temporal  SpO2: 95%  Weight: 236 lb (107 kg)  Height: 5' 1.5" (1.562 m)     Body mass index is 43.87 kg/m.  Physical Exam:   Physical Exam Vitals and nursing note reviewed.  Constitutional:      General: She is not in acute distress.    Appearance: She is well-developed. She is not ill-appearing or toxic-appearing.  Cardiovascular:     Rate and Rhythm: Normal rate and regular rhythm.     Pulses: Normal pulses.     Heart sounds: Normal heart sounds, S1 normal and S2 normal.  Pulmonary:     Effort: Pulmonary effort is normal.     Breath sounds: Normal breath sounds.  Skin:    General: Skin is warm and dry.  Neurological:     Mental Status: She is alert.     GCS: GCS eye subscore is 4. GCS verbal subscore is 5. GCS motor subscore is 6.  Psychiatric:        Speech: Speech normal.        Behavior: Behavior normal. Behavior is cooperative.    Assessment and Plan:   Chronic right shoulder pain; Preoperative clearance She has already undergone pre-surgical testing and EKG with Brittany. Tamera Punt yesterday. Labs were reviewed. Hgb stable, renal function WNL and HgbA1c has improved.  Because she is established with cardiology, Brittany. Buford Dresser, I will reach out to her office to see if she can provide  clearance prior to patient's upcoming surgery.    I,Havlyn C Ratchford,acting as a Education administrator for [employer], PA.,have documented all relevant documentation on the behalf of Inda Coke, PA,as directed by  Inda Coke, PA while in the presence of Inda Coke, [place].   I, Inda Coke, [place], have reviewed all documentation for this visit. The documentation on 01/30/21 for the exam, diagnosis, procedures, and orders are all accurate and complete.   Inda Coke, PA-C

## 2021-01-29 ENCOUNTER — Encounter (HOSPITAL_COMMUNITY)
Admission: RE | Admit: 2021-01-29 | Discharge: 2021-01-29 | Disposition: A | Discharge status: Home / Self Care | Payer: Medicare HMO | Source: Ambulatory Visit | Attending: Orthopedic Surgery | Admitting: Orthopedic Surgery

## 2021-01-29 ENCOUNTER — Other Ambulatory Visit: Payer: Self-pay

## 2021-01-29 ENCOUNTER — Encounter (HOSPITAL_COMMUNITY): Payer: Self-pay

## 2021-01-29 ENCOUNTER — Ambulatory Visit (HOSPITAL_COMMUNITY)
Admission: RE | Admit: 2021-01-29 | Discharge: 2021-01-29 | Disposition: A | Discharge status: Home / Self Care | Payer: Medicare HMO | Source: Ambulatory Visit | Attending: Orthopedic Surgery | Admitting: Orthopedic Surgery

## 2021-01-29 VITALS — BP 165/72 | HR 71 | Temp 98.0°F | Resp 12 | Ht 61.5 in | Wt 234.4 lb

## 2021-01-29 DIAGNOSIS — I10 Essential (primary) hypertension: Secondary | ICD-10-CM | POA: Insufficient documentation

## 2021-01-29 DIAGNOSIS — Z01811 Encounter for preprocedural respiratory examination: Secondary | ICD-10-CM

## 2021-01-29 DIAGNOSIS — Z01818 Encounter for other preprocedural examination: Secondary | ICD-10-CM | POA: Diagnosis not present

## 2021-01-29 DIAGNOSIS — E1169 Type 2 diabetes mellitus with other specified complication: Secondary | ICD-10-CM

## 2021-01-29 DIAGNOSIS — E669 Obesity, unspecified: Secondary | ICD-10-CM | POA: Insufficient documentation

## 2021-01-29 HISTORY — DX: Pneumonia, unspecified organism: J18.9

## 2021-01-29 LAB — BASIC METABOLIC PANEL
Anion gap: 6 (ref 5–15)
BUN: 12 mg/dL (ref 8–23)
CO2: 31 mmol/L (ref 22–32)
Calcium: 8.8 mg/dL — ABNORMAL LOW (ref 8.9–10.3)
Chloride: 101 mmol/L (ref 98–111)
Creatinine, Ser: 0.71 mg/dL (ref 0.44–1.00)
GFR, Estimated: >60 mL/min (ref >60)
Glucose, Bld: 99 mg/dL (ref 70–99)
Potassium: 3.7 mmol/L (ref 3.5–5.1)
Sodium: 138 mmol/L (ref 135–145)

## 2021-01-29 LAB — CBC
HCT: 33.2 % — ABNORMAL LOW (ref 36.0–46.0)
Hemoglobin: 10.5 g/dL — ABNORMAL LOW (ref 12.0–15.0)
MCH: 28.6 pg (ref 26.0–34.0)
MCHC: 31.6 g/dL (ref 30.0–36.0)
MCV: 90.5 fL (ref 80.0–100.0)
Platelets: 361 10*3/uL (ref 150–400)
RBC: 3.67 MIL/uL — ABNORMAL LOW (ref 3.87–5.11)
RDW: 16.6 % — ABNORMAL HIGH (ref 11.5–15.5)
WBC: 7.4 10*3/uL (ref 4.0–10.5)
nRBC: 0 % (ref 0.0–0.2)

## 2021-01-29 LAB — SURGICAL PCR SCREEN
MRSA, PCR: NEGATIVE
Staphylococcus aureus: NEGATIVE

## 2021-01-29 LAB — HEMOGLOBIN A1C
Hgb A1c MFr Bld: 6.7 % — ABNORMAL HIGH (ref 4.8–5.6)
Mean Plasma Glucose: 145.59 mg/dL

## 2021-01-29 LAB — TYPE AND SCREEN
ABO/RH(D): O POS
Antibody Screen: NEGATIVE

## 2021-01-29 LAB — GLUCOSE, CAPILLARY: Glucose-Capillary: 113 mg/dL — ABNORMAL HIGH (ref 70–99)

## 2021-01-30 ENCOUNTER — Encounter: Payer: Self-pay | Admitting: Physician Assistant

## 2021-01-30 ENCOUNTER — Ambulatory Visit (INDEPENDENT_AMBULATORY_CARE_PROVIDER_SITE_OTHER): Payer: Medicare HMO | Admitting: Physician Assistant

## 2021-01-30 VITALS — BP 130/70 | HR 79 | Temp 97.3°F | Ht 61.5 in | Wt 236.0 lb

## 2021-01-30 DIAGNOSIS — Z01818 Encounter for other preprocedural examination: Secondary | ICD-10-CM

## 2021-01-30 DIAGNOSIS — G8929 Other chronic pain: Secondary | ICD-10-CM

## 2021-01-30 DIAGNOSIS — M25511 Pain in right shoulder: Secondary | ICD-10-CM

## 2021-01-30 NOTE — Patient Instructions (Signed)
It was great to see you!  I will send a message to cardiology regarding your upcoming surgery. I'm hoping that they will respond quickly to let us know if you are a good surgical candidate.  Please schedule an appointment with Lyndee Hensen here for Physical Therapy at your convenience.  Take care,  Inda Coke PA-C

## 2021-02-02 ENCOUNTER — Telehealth: Payer: Self-pay | Admitting: Physician Assistant

## 2021-02-02 NOTE — Telephone Encounter (Signed)
Please call patient and let her know that I have not heard from her cardiology office in regards to her surgical clearance.  I recommend that she reach out to Dr. Judeth Cornfield office to inquire about a pre-surgical clearance. They may refer to my note if needed.  Inda Coke PA-C

## 2021-02-03 DIAGNOSIS — M48062 Spinal stenosis, lumbar region with neurogenic claudication: Secondary | ICD-10-CM | POA: Diagnosis not present

## 2021-02-03 NOTE — Telephone Encounter (Signed)
Pt was returning a call. Please Advise.  

## 2021-02-03 NOTE — Telephone Encounter (Signed)
Spoke to pt told her I did not call her again. I suggest you contact Cardiology again tomorrow morning in case you missed there call. Pt verbalized understanding.

## 2021-02-03 NOTE — Telephone Encounter (Signed)
Spoke to pt told her per Aldona Bar, that I have not heard from her cardiology office in regards to her surgical clearance.  I recommend that she reach out to Dr. Judeth Cornfield office to inquire about a pre-surgical clearance. They may refer to my note if needed.  Pt verbalized understanding and will contact office.

## 2021-02-03 NOTE — Progress Notes (Addendum)
Anesthesia Chart Review   Case: [id number] Date/Time: 02/12/21 0715   Procedure: REVERSE SHOULDER ARTHROPLASTY (Right: Shoulder)   Anesthesia type: Choice   Pre-op diagnosis: RIGHT SHOULDER END STAGE OSTEOARTHRITIS   Location: [facility] ROOM 07 / [facility]   Surgeons: Tania Ade, MD       DISCUSSION:70 y.o. former smoker with h/o PONV, HTN, GERD, DM II, sleep apnea, RBBB, chronic diastolic heart failure, right shoulder OA scheduled for above procedure 02/12/2021 with Dr. Tania Ade.   Pt seen by cardiology 02/04/21 for preoperative evaluation.  Per OV note, "Patient is anticipating right shoulder replacement 02/12/2021.  She is limited in mobility by myalgias/arthralgias and unable to complete 4 METS.  As such we will pursue an ischemic evaluation prior to her planned surgery. - We will arrange a coronary CTA to r/o ischemia.  We will give one-time dose of metoprolol 50 for day of her procedure. -We will finalize preop recommendations following CTA results"  Coronary CTA 02/09/2021 with nonobstructive CAD.   Anticipate pt can proceed with planned procedure barring acute status change.   VS: BP (!) 165/72   Pulse 71   Temp 36.7 C (Oral)   Resp 12   Ht 5' 1.5" (1.562 m)   Wt 106.3 kg   SpO2 99%   BMI 43.57 kg/m   PROVIDERS: Inda Coke, PA is PCP   Buford Dresser, MD is Cardiologist  LABS: Labs reviewed: Acceptable for surgery. (all labs ordered are listed, but only abnormal results are displayed)  Labs Reviewed  CBC - Abnormal; Notable for the following components:      Result Value   RBC 3.67 (*)    Hemoglobin 10.5 (*)    HCT 33.2 (*)    RDW 16.6 (*)    All other components within normal limits  BASIC METABOLIC PANEL - Abnormal; Notable for the following components:   Calcium 8.8 (*)    All other components within normal limits  HEMOGLOBIN A1C - Abnormal; Notable for the following components:   Hgb A1c MFr Bld 6.7 (*)    All other components within normal  limits  GLUCOSE, CAPILLARY - Abnormal; Notable for the following components:   Glucose-Capillary 113 (*)    All other components within normal limits  SURGICAL PCR SCREEN  TYPE AND SCREEN     IMAGES:   EKG: 01/29/2021 Rate 63 bpm  Normal sinus rhythm Left axis deviation Right bundle branch block Minimal voltage criteria for LVH, may be normal variant ( R in aVL ) Abnormal ECG No significant change since last tracing  CV: Echo 02/15/2020  1. Left ventricular ejection fraction, by estimation, is 65 to 70%. The  left ventricle has normal function. The left ventricle has no regional  wall motion abnormalities. Left ventricular diastolic parameters were  normal.   2. Right ventricular systolic function is normal. The right ventricular  size is normal. There is normal pulmonary artery systolic pressure.   3. The mitral valve is normal in structure. Trivial mitral valve  regurgitation.   4. The aortic valve is normal in structure. Aortic valve regurgitation is  not visualized.   5. The inferior vena cava is normal in size with greater than 50%  respiratory variability, suggesting right atrial pressure of 3 mmHg. Past Medical History:  Diagnosis Date   Allergic rhinitis, cause unspecified    Arthritis    Bipolar disorder, unspecified ([place])    depression   Cancer ([place])    skin cancer on left leg, left breast  cancer   Chronic diastolic heart failure (HCC)    Depression    GERD (gastroesophageal reflux disease)    Headache    History of gout    "haven't had it in several years" (02/08/2012)   History of kidney stones    History of wrist fracture    rt wrist   Hypertension    Iron deficiency anemia    Kidney stones    sees urologist @ [facility]   Morbid obesity ([place])    Neuropathy due to secondary diabetes ([place])    Nontoxic multinodular goiter    Osteoporosis    Pneumonia    PONV (postoperative nausea and vomiting)    PTSD (post-traumatic stress disorder)    Pure  hypercholesterolemia    RBBB    Scleroderma ([place])    Sleep apnea 10/2012   mild osa-did not need cpap -dr clance   Type II diabetes mellitus ([place])    Unspecified hypothyroidism     Past Surgical History:  Procedure Laterality Date   ABDOMINAL HYSTERECTOMY  1976   BREAST EXCISIONAL BIOPSY Left    BREAST LUMPECTOMY WITH RADIOACTIVE SEED AND SENTINEL LYMPH NODE BIOPSY Left 04/25/2018   Procedure: LEFT BREAST LUMPECTOMY WITH RADIOACTIVE SEED AND SENTINEL LYMPH NODE BIOPSY;  Surgeon: Excell Seltzer, MD;  Location: [facility];  Service: General;  Laterality: Left;   [place]  2001   sees Dr Peter Martinique   CATARACT EXTRACTION  2014   CATARACT EXTRACTION, BILATERAL  02/2011   epps   CHOLECYSTECTOMY  1985   EXCISIONAL HEMORRHOIDECTOMY     "dr cut out in his office" (02/08/2012)   [place]   right   JOINT REPLACEMENT     rt knee   KNEE ARTHROSCOPY  08/2009   right   KNEE ARTHROSCOPY WITH MEDIAL MENISECTOMY     left   Left Cystoscopy   [place]   Lithotripsy (L) Kidney  1997   LUMBAR LAMINECTOMY/DECOMPRESSION MICRODISCECTOMY Left 07/01/2016   Procedure: Laminectomy for synovial cyst - left - Lumbar four-five;  Surgeon: Earnie Larsson, MD;  Location: [facility];  Service: Neurosurgery;  Laterality: Left;   PARTIAL MASTECTOMY WITH NEEDLE LOCALIZATION Left 09/01/2012   Procedure: PARTIAL MASTECTOMY WITH NEEDLE LOCALIZATION;  Surgeon: Adin Hector, MD;  Location: [facility];  Service: General;  Laterality: Left;   Percitania stone removed (L) Kidney  1992   Right nasal surgery  08/1988   Right sinus removed  08/1989   tooth partial   ROTATOR CUFF REPAIR  2013   right shoulder x 2   SHOULDER ARTHROSCOPY WITH ROTATOR CUFF REPAIR AND SUBACROMIAL DECOMPRESSION Left 08/20/2013   Procedure: SHOULDER ARTHROSCOPY  AND SUBACROMIAL DECOMPRESSION;  Surgeon: Nita Sells, MD;  Location: [facility];  Service:  Orthopedics;  Laterality: Left;  Left shoulder arthroscopy, debridement, subacromial decompression, distal clavical resection   THYROIDECTOMY  04/22/2011   Procedure: THYROIDECTOMY;  Surgeon: Onnie Graham, MD;  Location: [facility];  Service: ENT;  Laterality: N/A;  TOTAL THYROIDECOTMY   TOTAL KNEE ARTHROPLASTY  06/18/2011   Procedure: TOTAL KNEE ARTHROPLASTY;  Surgeon: Kerin Salen, MD;  Location: [facility];  Service: Orthopedics;  Laterality: Left;  DEPUY   TOTAL KNEE ARTHROPLASTY  02/07/2012   Procedure: TOTAL KNEE ARTHROPLASTY;  Surgeon: Kerin Salen, MD;  Location: [facility];  Service: Orthopedics;  Laterality: Right;   TUBAL LIGATION  1972  MEDICATIONS:  allopurinol (ZYLOPRIM) 300 MG tablet   anastrozole (ARIMIDEX) 1 MG tablet   aspirin EC 81 MG tablet   Biotin 10 MG CAPS   Blood Glucose Monitoring Suppl (ACCU-CHEK AVIVA PLUS) w/Device KIT   cetirizine (ZYRTEC) 10 MG tablet   Cholecalciferol (VITAMIN D3) 50 MCG (2000 UT) TABS   cyclobenzaprine (FLEXERIL) 5 MG tablet   doxepin (SINEQUAN) 10 MG capsule   fluticasone (FLONASE) 50 MCG/ACT nasal spray   folic acid (FOLVITE) 1 MG tablet   furosemide (LASIX) 40 MG tablet   glucose blood (ACCU-CHEK AVIVA PLUS) test strip   Insulin Pen Needle (DROPLET PEN NEEDLES) 31G X 5 MM MISC   Insulin Syringe-Needle U-100 (INSULIN SYRINGE 1CC/31GX5/16") 31G X 5/16" 1 ML MISC   lamoTRIgine (LAMICTAL) 200 MG tablet   Lancets (ACCU-CHEK MULTICLIX) lancets   levothyroxine (SYNTHROID, LEVOTHROID) 50 MCG tablet   lisinopril (ZESTRIL) 5 MG tablet   metFORMIN (GLUCOPHAGE) 500 MG tablet   methotrexate 2.5 MG tablet   Multiple Vitamin (MULTIVITAMIN WITH MINERALS) TABS tablet   omeprazole (PRILOSEC) 20 MG capsule   oxybutynin (DITROPAN-XL) 10 MG 24 hr tablet   potassium citrate (UROCIT-K) 10 MEQ (1080 MG) SR tablet   pravastatin (PRAVACHOL) 20 MG tablet   predniSONE (DELTASONE) 5 MG tablet   pregabalin (LYRICA) 50 MG capsule   saw palmetto 500 MG capsule    sertraline (ZOLOFT) 100 MG tablet   sulfaSALAzine (AZULFIDINE) 500 MG tablet   tamsulosin (FLOMAX) 0.4 MG CAPS capsule   TRESIBA FLEXTOUCH 100 UNIT/ML FlexTouch Pen   vitamin A 3 MG (10000 UNITS) capsule   No current facility-administered medications for this encounter.     Konrad Felix Ward, PA-C [facility] [phone]

## 2021-02-04 ENCOUNTER — Telehealth: Payer: Self-pay | Admitting: Cardiology

## 2021-02-04 ENCOUNTER — Other Ambulatory Visit: Payer: Self-pay

## 2021-02-04 ENCOUNTER — Encounter: Payer: Self-pay | Admitting: Medical

## 2021-02-04 ENCOUNTER — Ambulatory Visit: Payer: Medicare HMO | Admitting: Medical

## 2021-02-04 ENCOUNTER — Other Ambulatory Visit: Payer: Self-pay | Admitting: Family Medicine

## 2021-02-04 VITALS — BP 168/78 | HR 67 | Ht 61.5 in | Wt 240.6 lb

## 2021-02-04 DIAGNOSIS — Z01818 Encounter for other preprocedural examination: Secondary | ICD-10-CM | POA: Diagnosis not present

## 2021-02-04 DIAGNOSIS — E1169 Type 2 diabetes mellitus with other specified complication: Secondary | ICD-10-CM

## 2021-02-04 DIAGNOSIS — E669 Obesity, unspecified: Secondary | ICD-10-CM

## 2021-02-04 DIAGNOSIS — Z7409 Other reduced mobility: Secondary | ICD-10-CM

## 2021-02-04 DIAGNOSIS — E7800 Pure hypercholesterolemia, unspecified: Secondary | ICD-10-CM

## 2021-02-04 DIAGNOSIS — R9431 Abnormal electrocardiogram [ECG] [EKG]: Secondary | ICD-10-CM | POA: Diagnosis not present

## 2021-02-04 DIAGNOSIS — I10 Essential (primary) hypertension: Secondary | ICD-10-CM

## 2021-02-04 MED ORDER — METOPROLOL TARTRATE 50 MG PO TABS: ORAL_TABLET | ORAL | 0 refills | Status: DC

## 2021-02-04 NOTE — Telephone Encounter (Signed)
   [facility] Pre-operative Risk Assessment    Patient Name: Brittany Acosta  DOB: [birth year] MRN: [id number]  HEARTCARE STAFF:  - IMPORTANT!!!!!! Under Visit Info/Reason for Call, type in Other and utilize the format Clearance MM/DD/YY or Clearance TBD. Do not use dashes or single digits. - Please review there is not already an duplicate clearance open for this procedure. - If request is for dental extraction, please clarify the # of teeth to be extracted. - If the patient is currently at the dentist's office, call Pre-Op Callback Staff (MA/nurse) to input urgent request.  - If the patient is not currently in the dentist office, please route to the Pre-Op pool.  Request for surgical clearance:  What type of surgery is being performed? REVERSE SHOULDER ARTHROPLASTY  When is this surgery scheduled? 02/12/21  What type of clearance is required (medical clearance vs. Pharmacy clearance to hold med vs. Both)? Pharmacy   Are there any medications that need to be held prior to surgery and how long? Asprin 3-5 days before  Practice name and name of physician performing surgery? [facility]/ Tania Ade, MD  What is the office phone number? [phone]   7.   What is the office fax number? [phone]  8.   Anesthesia type (None, local, MAC, general) ? Choice   Kamira J Martinique 02/04/2021, 11:48 AM  _________________________________________________________________   (provider comments below)

## 2021-02-04 NOTE — Telephone Encounter (Signed)
I s/w the pt and she is agreeable to plan of care for pre op clearance. Appt today with Roby Lofts, PAC 2:45 at the [facility] location. Pt is grateful for the call and the help. I will forward notes to [facility] for appt today. Will send FYI to surgeon's office pt has appt 02/04/21 @ 2:45.

## 2021-02-04 NOTE — Patient Instructions (Addendum)
Medication Instructions:  TAKE Metoprolol Tartrate 50 mg 1-2 hours prior to CT procedure  HOLD Metformin the morning of CT procedure and 2 days after *If you need a refill on your cardiac medications before your next appointment, please call your pharmacy*  Lab Work: Your physician recommends that you return for lab work TODAY:  BMET If you have labs (blood work) drawn today and your tests are completely normal, you will receive your results only by: [employer] (if you have MyChart) OR A paper copy in the mail If you have any lab test that is abnormal or we need to change your treatment, we will call you to review the results.  Testing/Procedures: Your physician has requested that you have cardiac CT. Cardiac computed tomography (CT) is a painless test that uses an x-ray machine to take clear, detailed pictures of your heart. For further information please visit [email or web address]. Please follow instruction sheet as given.  Follow-Up: At [facility], you and your health needs are our priority.  As part of our continuing mission to provide you with exceptional heart care, we have created designated Provider Care Teams.  These Care Teams include your primary Cardiologist (physician) and Advanced Practice Providers (APPs -  Physician Assistants and Nurse Practitioners) who all work together to provide you with the care you need, when you need it.  Your next appointment:   1 year(s)  The format for your next appointment:   In Person  Provider:   Buford Dresser, MD  Other Instructions     Your cardiac CT will be scheduled at one of the below locations:   [facility] [address] [place], [place] [address] [phone]  [place] [address] [place], [place] [address] [phone]  If scheduled at [facility], please arrive at the [facility] main entrance (entrance A) of [facility] 30 minutes prior to test start time. You can use the FREE valet parking offered at the main entrance (encouraged to control the heart rate for the test) Proceed to the [facility] Radiology Department (first floor) to check-in and test prep.  If scheduled at [facility], please arrive 15 mins early for check-in and test prep.  Please follow these instructions carefully (unless otherwise directed):  Hold Metformin the morning of CT and 2 days after  On the Night Before the Test: Be sure to Drink plenty of water. Do not consume any caffeinated/decaffeinated beverages or chocolate 12 hours prior to your test. Do not take any antihistamines 12 hours prior to your test. If the patient has contrast allergy: Patient will need a prescription for Prednisone and very clear instructions (as follows): Prednisone 50 mg - take 13 hours prior to test Take another Prednisone 50 mg 7 hours prior to test Take another Prednisone 50 mg 1 hour prior to test Take Benadryl 50 mg 1 hour prior to test Patient must complete all four doses of above prophylactic medications. Patient will need a ride after test due to Benadryl.  On the Day of the Test: Drink plenty of water until 1 hour prior to the test. Do not eat any food 4 hours prior to the test. You may take your regular medications prior to the test.  Take metoprolol (Lopressor) two hours prior to test. HOLD Furosemide/Hydrochlorothiazide morning of the test. FEMALES- please wear underwire-free bra if available, avoid dresses & tight clothing     After the Test:  Drink plenty of water. After receiving IV contrast, you may experience a mild flushed feeling. This is normal. On occasion, you may experience a mild rash up to 24 hours after the test. This is not dangerous. If this occurs, you can take Benadryl 25 mg and increase your fluid intake. If you experience trouble breathing, this can be serious. If it is severe call  [phone] IMMEDIATELY. If it is mild, please call our office. If you take any of these medications: Glipizide/Metformin, Avandament, Glucavance, please do not take 48 hours after completing test unless otherwise instructed.  Please allow 2-4 weeks for scheduling of routine cardiac CTs. Some insurance companies require a pre-authorization which may delay scheduling of this test.   For non-scheduling related questions, please contact the cardiac imaging nurse navigator should you have any questions/concerns: Marchia Bond, Cardiac Imaging Nurse Navigator Gordy Clement, Cardiac Imaging Nurse Navigator [facility] Direct Office Dial: [phone]   For scheduling needs, including cancellations and rescheduling, please call [place], [phone].

## 2021-02-04 NOTE — Progress Notes (Signed)
Cardiology Office Note   Date:  02/05/2021   ID:  Brittany Acosta, DOB Aug 12, 1950, MRN [id number]  PCP:  Brittany Coke, PA  Cardiologist:  Brittany Dresser, MD EP: None  Chief Complaint  Patient presents with   Pre-op Exam      History of Present Illness: Brittany Acosta is a 70 y.o. female with a PMH of HTN, HLD, DM type II c/b neuropathy, OSA, scleroderma, fibromyalgia, and obesity who presents for preop evaluation.  She was last evaluated by cardiology at an outpatient visit with Brittany Acosta 01/25/2020 to establish care and further evaluate lower extremity edema.  She was recommended to undergo an echocardiogram which occurred on 02/2020 and showed EF 65 to 70%, normal LV diastolic function, no R WMA, normal RV size/function, and no valvular abnormalities.  She was noted to have a carotid bruit on exam though carotid Dopplers 02/2021 revealed no evidence of stenosis and bruit was felt to be secondary to twisting nature of the vessel.  She had a lipid panel done at that visit which showed LDL 92, no in light of reassuring work-up above she was not recommended to initiate a statin.  She presents today for preop evaluation.  She is anticipating right shoulder replacement 02/12/2021 for management of chronic right shoulder pain.  She is chronically limited by myalgias/arthralgias and neuropathy.  Her ongoing shoulder discomfort has been added to her inactivity.  She is unable to complete 4 METS at this time.  That being said she has had no complaints of chest pain, shortness of breath, dizziness, lightheadedness, palpitations, orthopnea, PND.  She reports her chronic lower extremity edema is at baseline.  We discussed need for an ischemic evaluation going forward to which she was agreeable.    Past Medical History:  Diagnosis Date   Allergic rhinitis, cause unspecified    Arthritis    Bipolar disorder, unspecified (Brittany Acosta)    depression   Cancer (Brittany Acosta)    skin cancer on left  leg, left breast cancer   Chronic diastolic heart failure (HCC)    Depression    GERD (gastroesophageal reflux disease)    Headache    History of gout    "haven't had it in several years" (02/08/2012)   History of kidney stones    History of wrist fracture    rt wrist   Hypertension    Iron deficiency anemia    Kidney stones    sees urologist @ [facility]   Morbid obesity ([place])    Neuropathy due to secondary diabetes ([place])    Nontoxic multinodular goiter    Osteoporosis    Pneumonia    PONV (postoperative nausea and vomiting)    PTSD (post-traumatic stress disorder)    Pure hypercholesterolemia    RBBB    Scleroderma ([place])    Sleep apnea 10/2012   mild osa-did not need cpap -dr clance   Type II diabetes mellitus ([place])    Unspecified hypothyroidism     Past Surgical History:  Procedure Laterality Date   ABDOMINAL HYSTERECTOMY  1976   BREAST EXCISIONAL BIOPSY Left    BREAST LUMPECTOMY WITH RADIOACTIVE SEED AND SENTINEL LYMPH NODE BIOPSY Left 04/25/2018   Procedure: LEFT BREAST LUMPECTOMY WITH RADIOACTIVE SEED AND SENTINEL LYMPH NODE BIOPSY;  Surgeon: Brittany Seltzer, MD;  Location: [facility];  Service: General;  Laterality: Left;   [place]  2001   sees Dr Brittany Acosta   CATARACT EXTRACTION  2014  CATARACT EXTRACTION, BILATERAL  02/2011   epps   CHOLECYSTECTOMY  1985   EXCISIONAL HEMORRHOIDECTOMY     "dr cut out in his office" (02/08/2012)   INCISIONAL BREAST BIOPSY  2000   right   JOINT REPLACEMENT     rt knee   KNEE ARTHROSCOPY  08/2009   right   KNEE ARTHROSCOPY WITH MEDIAL MENISECTOMY     left   Left Cystoscopy   1990   LEFT OOPHORECTOMY  1980   Lithotripsy (L) Kidney  1997   LUMBAR LAMINECTOMY/DECOMPRESSION MICRODISCECTOMY Left 07/01/2016   Procedure: Laminectomy for synovial cyst - left - Lumbar four-five;  Surgeon: Brittany Larsson, MD;  Location: [facility];  Service: Neurosurgery;  Laterality: Left;   PARTIAL MASTECTOMY WITH NEEDLE  LOCALIZATION Left 09/01/2012   Procedure: PARTIAL MASTECTOMY WITH NEEDLE LOCALIZATION;  Surgeon: Brittany Hector, MD;  Location: [facility];  Service: General;  Laterality: Left;   [place]erc[place]ia stone removed (L) Kidney  1992   Right nasal surgery  08/1988   Right sinus removed  08/1989   tooth partial   ROTATOR CUFF REPAIR  2013   right shoulder x 2   SHOULDER ARTHROSCOPY WITH ROTATOR CUFF REPAIR AND SUBACROMIAL DECOMPRESSION Left 08/20/2013   Procedure: SHOULDER ARTHROSCOPY  AND SUBACROMIAL DECOMPRESSION;  Surgeon: Brittany Sells, MD;  Location: [facility];  Service: Orthopedics;  Laterality: Left;  Left shoulder arthroscopy, debridement, subacromial decompression, distal clavical resection   THYROIDECTOMY  04/22/2011   Procedure: THYROIDECTOMY;  Surgeon: Brittany Graham, MD;  Location: [facility];  Service: ENT;  Laterality: N/A;  TOTAL THYROIDECOTMY   TOTAL KNEE ARTHROPLASTY  06/18/2011   Procedure: TOTAL KNEE ARTHROPLASTY;  Surgeon: Brittany Salen, MD;  Location: [facility];  Service: Orthopedics;  Laterality: Left;  DEPUY   TOTAL KNEE ARTHROPLASTY  02/07/2012   Procedure: TOTAL KNEE ARTHROPLASTY;  Surgeon: Brittany Salen, MD;  Location: [facility];  Service: Orthopedics;  Laterality: Right;   TUBAL LIGATION  1972     Current Outpatient Medications  Medication Sig Dispense Refill   allopurinol (ZYLOPRIM) 300 MG tablet TAKE 1 TABLET (300 MG TOTAL) BY MOUTH DAILY. 90 tablet 0   anastrozole (ARIMIDEX) 1 MG tablet TAKE 1 TABLET EVERY DAY 90 tablet 0   Biotin 10 MG CAPS Take 10 mg by mouth at bedtime.     cetirizine (ZYRTEC) 10 MG tablet Take 20 mg by mouth daily.      Cholecalciferol (VITAMIN D3) 50 MCG (2000 UT) TABS Take 2,000 Units by mouth daily.      cyclobenzaprine (FLEXERIL) 5 MG tablet Take 1 tablet (5 mg total) by mouth at bedtime as needed for muscle spasms. 90 tablet 0   doxepin (SINEQUAN) 10 MG capsule TAKE 1 CAPSULE EVERY DAY 90 capsule 0   fluticasone (FLONASE) 50 MCG/ACT nasal  spray USE 2 SPRAYS IN EACH NOSTRIL EVERY DAY 48 g 1   folic acid (FOLVITE) 1 MG tablet Take 2 mg by mouth daily.     furosemide (LASIX) 40 MG tablet Take 1 tablet (40 mg total) by mouth daily. 90 tablet 3   Insulin Pen Needle (DROPLET PEN NEEDLES) 31G X 5 MM MISC USE TO INJECT INSULIN DAILY 100 each 3   lamoTRIgine (LAMICTAL) 200 MG tablet Take 1 tablet (200 mg total) by mouth daily at 12 noon. 90 tablet 3   levothyroxine (SYNTHROID, LEVOTHROID) 50 MCG tablet Take 1 tablet (50 mcg total) by mouth daily. --- Office visit needed for further refills 90  tablet 0   lisinopril (ZESTRIL) 5 MG tablet TAKE 1 TABLET EVERY DAY 90 tablet 1   metFORMIN (GLUCOPHAGE) 500 MG tablet Take 1000 mg in the AM and 500 mg in the PM. 360 tablet 1   methotrexate 2.5 MG tablet Take 20 mg by mouth every Saturday.     metoprolol tartrate (LOPRESSOR) 50 MG tablet Take 1-2 hours prior to procedure 1 tablet 0   Multiple Vitamin (MULTIVITAMIN WITH MINERALS) TABS tablet Take 1 tablet by mouth in the morning and at bedtime.     omeprazole (PRILOSEC) 20 MG capsule Take 1 capsule (20 mg total) by mouth daily. Take 30 minutes before a meal 90 capsule 3   oxybutynin (DITROPAN-XL) 10 MG 24 hr tablet Take 1 tablet (10 mg total) by mouth every evening. (Patient taking differently: Take 10 mg by mouth at bedtime.) 90 tablet 3   potassium citrate (UROCIT-K) 10 MEQ (1080 MG) SR tablet 10 mEq.     pravastatin (PRAVACHOL) 20 MG tablet Take 20 mg by mouth at bedtime.     predniSONE (DELTASONE) 5 MG tablet Take 5 mg by mouth daily with breakfast.      pregabalin (LYRICA) 50 MG capsule Take 1 capsule (50 mg total) by mouth 2 (two) times daily. 180 capsule 1   saw palmetto 500 MG capsule Take 500 mg by mouth daily.     sertraline (ZOLOFT) 100 MG tablet Take 100 mg by mouth daily.      sulfaSALAzine (AZULFIDINE) 500 MG tablet Take 1,000 mg by mouth daily.     tamsulosin (FLOMAX) 0.4 MG CAPS capsule Take 0.4 mg by mouth daily.     TRESIBA  FLEXTOUCH 100 UNIT/ML FlexTouch Pen INJECT 26 UNITS SUBCUTANEOUSLY DAILY 30 mL 1   vitamin A 3 MG (10000 UNITS) capsule Take 10,000 Units by mouth daily.     aspirin EC 81 MG tablet Take 1 tablet (81 mg total) by mouth daily. Swallow whole. (Patient not taking: Reported on 02/04/2021) 90 tablet 3   No current facility-administered medications for this visit.    Allergies:   Patient has no known allergies.    Social History:  The patient  reports that she quit smoking about 37 years ago. Her smoking use included cigarettes. She has a 6.00 pack-year smoking history. She has never used smokeless tobacco. She reports that she does not drink alcohol and does not use drugs.   Family History:  The patient's family history includes Coronary artery disease in an other family member; Diabetes in her daughter and father; Heart attack in an other family member; Hyperlipidemia in her father; Hypertension in her brother, brother, brother, father, mother, sister, sister, and sister; Lung cancer in her mother.    ROS:  Please see the history of present illness.   Otherwise, review of systems are positive for none.   All other systems are reviewed and negative.    PHYSICAL EXAM: VS:  BP (!) 168/78   Pulse 67   Ht 5' 1.5" (1.562 m)   Wt 240 lb 9.6 oz (109.1 kg)   SpO2 98%   BMI 44.72 kg/m  , BMI Body mass index is 44.72 kg/m. GEN: Well nourished, well developed, in no acute distress HEENT: Sclera anicteric Neck: no JVD, carotid bruits, or masses Cardiac: RRR; no murmurs, rubs, or gallops, trace LE edema  Respiratory:  clear to auscultation bilaterally, normal work of breathing GI: soft, obese, nontender, nondistended, + BS MS: no deformity or atrophy Skin: warm and dry,  no rash Neuro:  Strength and sensation are intact Psych: euthymic mood, full affect   EKG:  EKG is ordered today. The ekg ordered today demonstrates sinus rhythm, rate 67 bpm, chronic RBBB and LAFB, no T WI, no STE/D; no  significant change from previous   Recent Labs: 02/27/2020: TSH 2.93 06/24/2020: ALT 20 01/29/2021: Hemoglobin 10.5; Platelets 361 02/04/2021: BUN 8; Creatinine, Ser 0.50; Potassium 3.9; Sodium 140    Lipid Panel    Component Value Date/Time   CHOL 115 06/24/2020 1108   CHOL 168 01/25/2020 1145   TRIG 89.0 06/24/2020 1108   HDL 48.50 06/24/2020 1108   HDL 59 01/25/2020 1145   CHOLHDL 2 06/24/2020 1108   VLDL 17.8 06/24/2020 1108   LDLCALC 49 06/24/2020 1108   LDLCALC 92 01/25/2020 1145   LDLDIRECT 56.4 06/11/2011 0931      Wt Readings from Last 3 Encounters:  02/04/21 240 lb 9.6 oz (109.1 kg)  01/30/21 236 lb (107 kg)  01/29/21 234 lb 6.4 oz (106.3 kg)      Other studies Reviewed: Additional studies/ records that were reviewed today include:   Echocardiogram 02/2020: 1. Left ventricular ejection fraction, by estimation, is 65 to 70%. The  left ventricle has normal function. The left ventricle has no regional  wall motion abnormalities. Left ventricular diastolic parameters were  normal.   2. Right ventricular systolic function is normal. The right ventricular  size is normal. There is normal pulmonary artery systolic pressure.   3. The mitral valve is normal in structure. Trivial mitral valve  regurgitation.   4. The aortic valve is normal in structure. Aortic valve regurgitation is  not visualized.   5. The inferior vena cava is normal in size with greater than 50%  respiratory variability, suggesting right atrial pressure of 3 mmHg.   Comparison(s): The left ventricular function is unchanged.     ASSESSMENT AND PLAN:  1.  Preoperative evaluation: Patient is anticipating right shoulder replacement 02/12/2021.  She is limited in mobility by myalgias/arthralgias and unable to complete 4 METS.  As such we will pursue an ischemic evaluation prior to her planned surgery. - We will arrange a coronary CTA to r/o ischemia.  We will give one-time dose of metoprolol 50  for day of her procedure. -We will finalize preop recommendations following CTA results  2.  HTN: BP 168/78 today.  Was well controlled at 130/70 PCP visit 01/30/2021.  She reports she has been in a lot of pain today and rushed to make it to this appointment today. -Continue Lasix and lisinopril  3.  HLD: LDL 49 06/2020 -Continue pravastatin  4.  DM type II: A1c 6.7 01/29/21; at goal of less than 7 -Continue metformin and insulin per PCP   Current medicines are reviewed at length with the patient today.  The patient does not have concerns regarding medicines.  The following changes have been made:  As above  Labs/ tests ordered today include:   Orders Placed This Encounter  Procedures   CT CORONARY MORPH W/CTA COR W/SCORE W/CA W/CM &/OR WO/CM   Basic metabolic panel   EKG [id number]     Disposition:   FU with Brittany Acosta in 1 year  Signed, Abigail Butts, PA-C  02/05/2021 8:53 AM

## 2021-02-04 NOTE — Telephone Encounter (Signed)
   Name: JENILYN MAGANA  DOB: 1951/01/10  MRN: [id number]  Primary Cardiologist: Buford Dresser, MD  Chart reviewed as part of pre-operative protocol coverage. Because of TAYSIA RIVERE past medical history and time since last visit, she will require a follow-up visit in order to better assess preoperative cardiovascular risk.  Pre-op covering staff: - Please schedule appointment and call patient to inform them. If patient already had an upcoming appointment within acceptable timeframe, please add "pre-op clearance" to the appointment notes so provider is aware. - Please contact requesting surgeon's office via preferred method (i.e, phone, fax) to inform them of need for appointment prior to surgery.  If applicable, this message will also be routed to pharmacy pool and/or primary cardiologist for input on holding anticoagulant/antiplatelet agent as requested below so that this information is available to the clearing provider at time of patient's appointment.   Tami Lin Zyden Suman, PA  02/04/2021, 12:56 PM

## 2021-02-05 ENCOUNTER — Telehealth (HOSPITAL_COMMUNITY): Payer: Self-pay | Admitting: *Deleted

## 2021-02-05 ENCOUNTER — Telehealth: Payer: Self-pay | Admitting: Medical

## 2021-02-05 ENCOUNTER — Encounter: Payer: Self-pay | Admitting: Medical

## 2021-02-05 LAB — BASIC METABOLIC PANEL
BUN/Creatinine Ratio: 16 (ref 12–28)
BUN: 8 mg/dL (ref 8–27)
CO2: 26 mmol/L (ref 20–29)
Calcium: 9.1 mg/dL (ref 8.7–10.3)
Chloride: 98 mmol/L (ref 96–106)
Creatinine, Ser: 0.5 mg/dL — ABNORMAL LOW (ref 0.57–1.00)
Glucose: 155 mg/dL — ABNORMAL HIGH (ref 70–99)
Potassium: 3.9 mmol/L (ref 3.5–5.2)
Sodium: 140 mmol/L (ref 134–144)
eGFR: 101 mL/min/{1.73_m2} (ref >59)

## 2021-02-05 NOTE — Telephone Encounter (Signed)
New Message:     Patient wants to know if it is really necessary for her to have the CT Cardiac?

## 2021-02-05 NOTE — Telephone Encounter (Signed)
Called pt. She states "My bank account is getting low so I was wondering is it necessary for me to have the CT. I have had a lot of CTs lately and I want to know do I have to do this one?" Pt made aware the test is most likely to clear her to her surgery. But I can ask Daleen Snook is there another test she can do which is budget friendly for her; but it depends on other factors in what determined this selected test. She verbalized understanding.

## 2021-02-05 NOTE — Telephone Encounter (Signed)
Reaching out to patient to offer assistance regarding upcoming cardiac imaging study; pt verbalizes understanding of appt date/time, parking situation and where to check in, pre-test NPO status and medications ordered, and verified current allergies; name and call back number provided for further questions should they arise  Gordy Clement RN Navigator Cardiac Imaging [facility] [phone] office [phone] cell  Patient to take 50mg  metoprolol tartrate two hours prior to cardiac CT scan.

## 2021-02-05 NOTE — Telephone Encounter (Signed)
Calling patient to let her know that insurance has not approved cardiac CT scan.  We have reschedule patient's appointment to Monday November 7.  New instructions reviewed with patient and she verbalized understanding.  Pt has call back number if she has any questions.  Gordy Clement, RN navigator cardiac [facility] [phone]

## 2021-02-06 ENCOUNTER — Ambulatory Visit (HOSPITAL_COMMUNITY): Admission: RE | Admit: 2021-02-06 | Payer: Medicare HMO | Source: Ambulatory Visit

## 2021-02-06 DIAGNOSIS — M5416 Radiculopathy, lumbar region: Secondary | ICD-10-CM | POA: Diagnosis not present

## 2021-02-06 NOTE — Telephone Encounter (Signed)
Called pt to give her the update from [place]. She verbalized understanding. Her CT scan is Monday, she will call us to let us know if she wants to keep the appointment.

## 2021-02-06 NOTE — Telephone Encounter (Signed)
I agree that I think coronary CT is most helpful. I don't think nuclear would be cheaper, and it is more radiation, but that would be the only other option. I agree that without some time of testing it's hard to fully risk stratify her. Thanks.

## 2021-02-09 ENCOUNTER — Other Ambulatory Visit: Payer: Self-pay

## 2021-02-09 ENCOUNTER — Encounter (HOSPITAL_COMMUNITY): Payer: Self-pay

## 2021-02-09 ENCOUNTER — Ambulatory Visit (HOSPITAL_COMMUNITY)
Admission: RE | Admit: 2021-02-09 | Discharge: 2021-02-09 | Disposition: A | Discharge status: Home / Self Care | Payer: Medicare HMO | Source: Ambulatory Visit | Attending: Medical | Admitting: Medical

## 2021-02-09 DIAGNOSIS — Z7409 Other reduced mobility: Secondary | ICD-10-CM | POA: Diagnosis not present

## 2021-02-09 DIAGNOSIS — Z01818 Encounter for other preprocedural examination: Secondary | ICD-10-CM | POA: Diagnosis not present

## 2021-02-09 DIAGNOSIS — R9431 Abnormal electrocardiogram [ECG] [EKG]: Secondary | ICD-10-CM | POA: Insufficient documentation

## 2021-02-09 MED ORDER — IOHEXOL 350 MG/ML SOLN
95.0000 mL | Freq: Once | INTRAVENOUS | Status: AC | PRN
  Administered 2021-02-09: 95 mL via INTRAVENOUS

## 2021-02-09 MED ORDER — NITROGLYCERIN 0.4 MG SL SUBL
SUBLINGUAL_TABLET | SUBLINGUAL | Status: AC
  Filled 2021-02-09: qty 2

## 2021-02-09 MED ORDER — NITROGLYCERIN 0.4 MG SL SUBL
0.8000 mg | SUBLINGUAL_TABLET | Freq: Once | SUBLINGUAL | Status: AC
  Administered 2021-02-09: 0.8 mg via SUBLINGUAL

## 2021-02-10 ENCOUNTER — Other Ambulatory Visit: Payer: Self-pay | Admitting: Orthopedic Surgery

## 2021-02-10 ENCOUNTER — Other Ambulatory Visit: Payer: Self-pay | Admitting: Adult Health

## 2021-02-10 DIAGNOSIS — Z853 Personal history of malignant neoplasm of breast: Secondary | ICD-10-CM

## 2021-02-10 NOTE — Telephone Encounter (Signed)
    Patient Name: Brittany Acosta  DOB: [birth year] MRN: [id number]  Primary Cardiologist: Buford Dresser, MD  Chart reviewed as part of pre-operative protocol coverage.  Patient was seen in the office 02/04/2021 for preop evaluation.  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  Based on these findings Brittany Acosta would be at acceptable risk for the planned procedure without further cardiovascular testing.   She can hold aspirin up to 7 days prior to her upcoming surgery with plans to restart when cleared to do so by her orthopedist.  I will route this recommendation to the requesting party via [place] fax function and remove from pre-op pool.  Please call with questions.  Abigail Butts, PA-C 02/10/2021, 1:37 PM

## 2021-02-11 LAB — SARS CORONAVIRUS 2 (TAT 6-24 HRS): SARS Coronavirus 2: NEGATIVE

## 2021-02-11 NOTE — Anesthesia Preprocedure Evaluation (Addendum)
Anesthesia Evaluation  Patient identified by MRN, date of birth, ID band Patient awake    Reviewed: Allergy & Precautions, NPO status , Patient's Chart, lab work & pertinent test results  History of Anesthesia Complications (+) PONV  Airway Mallampati: II  TM Distance: >3 FB Neck ROM: Full    Dental no notable dental hx. (+) Partial Lower, Partial Upper   Pulmonary sleep apnea , former smoker,    Pulmonary exam normal breath sounds clear to auscultation       Cardiovascular hypertension, Normal cardiovascular exam+ dysrhythmias  Rhythm:Regular Rate:Normal  02/15/20 TTE   1. Left ventricular ejection fraction, by estimation, is 65 to 70%. The  left ventricle has normal function. The left ventricle has no regional  wall motion abnormalities. Left ventricular diastolic parameters were  normal.  2. Right ventricular systolic function is normal. The right ventricular  size is normal. There is normal pulmonary artery systolic pressure.  3. The mitral valve is normal in structure. Trivial mitral valve  regurgitation.  4. The aortic valve is normal in structure. Aortic valve regurgitation is  not visualized.  5. The inferior vena cava is normal in size with greater than 50%  respiratory variability, suggesting right atrial pressure of 3 mmHg.    Neuro/Psych  Headaches,    GI/Hepatic GERD  Medicated and Controlled,  Endo/Other  diabetes, Type 2Hypothyroidism   Renal/GU Lab Results      Component                Value               Date                      CREATININE               0.50 (L)            02/04/2021                BUN                      8                   02/04/2021                NA                       140                 02/04/2021                K                        3.9                 02/04/2021                CL                       98                  02/04/2021                CO2                       26  02/04/2021                Musculoskeletal  (+) Arthritis , Fibromyalgia -  Abdominal (+) + obese (BMI 44.73),   Peds  Hematology Lab Results      Component                Value               Date                      WBC                      7.4                 01/29/2021                HGB                      10.5 (L)            01/29/2021                HCT                      33.2 (L)            01/29/2021                MCV                      90.5                01/29/2021                PLT                      361                 01/29/2021              Anesthesia Other Findings   Reproductive/Obstetrics                           Anesthesia Physical Anesthesia Plan  ASA: 3  Anesthesia Plan: General   Post-op Pain Management:  Regional for Post-op pain   Induction: Intravenous  PONV Risk Score and Plan: 4 or greater and Treatment may vary due to age or medical condition, Ondansetron, Scopolamine patch - Pre-op and Midazolam  Airway Management Planned: Oral ETT  Additional Equipment: None  Intra-op Plan:   Post-operative Plan: Extubation in OR  Informed Consent: I have reviewed the patients History and Physical, chart, labs and discussed the procedure including the risks, benefits and alternatives for the proposed anesthesia with the patient or authorized representative who has indicated his/her understanding and acceptance.     Dental advisory given  Plan Discussed with: CRNA and Anesthesiologist  Anesthesia Plan Comments: (See PAT note 01/29/2021, Konrad Felix Ward, PA-C  [place] w R ISB)      Anesthesia Quick Evaluation

## 2021-02-12 ENCOUNTER — Ambulatory Visit (HOSPITAL_COMMUNITY): Payer: Medicare HMO

## 2021-02-12 ENCOUNTER — Ambulatory Visit (HOSPITAL_COMMUNITY): Payer: Medicare HMO | Admitting: Physician Assistant

## 2021-02-12 ENCOUNTER — Encounter (HOSPITAL_COMMUNITY)
Admission: RE | Disposition: A | Discharge status: Home / Self Care | Payer: Self-pay | Source: Home / Self Care | Attending: Orthopedic Surgery

## 2021-02-12 ENCOUNTER — Ambulatory Visit (HOSPITAL_COMMUNITY)
Admission: RE | Admit: 2021-02-12 | Discharge: 2021-02-12 | Disposition: A | Discharge status: Home / Self Care | Payer: Medicare HMO | Attending: Orthopedic Surgery | Admitting: Orthopedic Surgery

## 2021-02-12 ENCOUNTER — Encounter (HOSPITAL_COMMUNITY): Payer: Self-pay | Admitting: Orthopedic Surgery

## 2021-02-12 ENCOUNTER — Ambulatory Visit (HOSPITAL_COMMUNITY): Payer: Medicare HMO | Admitting: Registered Nurse

## 2021-02-12 DIAGNOSIS — M19011 Primary osteoarthritis, right shoulder: Secondary | ICD-10-CM | POA: Diagnosis not present

## 2021-02-12 DIAGNOSIS — E669 Obesity, unspecified: Secondary | ICD-10-CM | POA: Insufficient documentation

## 2021-02-12 DIAGNOSIS — E039 Hypothyroidism, unspecified: Secondary | ICD-10-CM | POA: Diagnosis not present

## 2021-02-12 DIAGNOSIS — M25711 Osteophyte, right shoulder: Secondary | ICD-10-CM | POA: Diagnosis not present

## 2021-02-12 DIAGNOSIS — G4730 Sleep apnea, unspecified: Secondary | ICD-10-CM | POA: Diagnosis not present

## 2021-02-12 DIAGNOSIS — Z9889 Other specified postprocedural states: Secondary | ICD-10-CM | POA: Diagnosis not present

## 2021-02-12 DIAGNOSIS — C50412 Malignant neoplasm of upper-outer quadrant of left female breast: Secondary | ICD-10-CM | POA: Diagnosis not present

## 2021-02-12 DIAGNOSIS — K219 Gastro-esophageal reflux disease without esophagitis: Secondary | ICD-10-CM | POA: Diagnosis not present

## 2021-02-12 DIAGNOSIS — M75101 Unspecified rotator cuff tear or rupture of right shoulder, not specified as traumatic: Secondary | ICD-10-CM | POA: Insufficient documentation

## 2021-02-12 DIAGNOSIS — Z471 Aftercare following joint replacement surgery: Secondary | ICD-10-CM | POA: Diagnosis not present

## 2021-02-12 DIAGNOSIS — Z87891 Personal history of nicotine dependence: Secondary | ICD-10-CM | POA: Diagnosis not present

## 2021-02-12 DIAGNOSIS — Z170 Estrogen receptor positive status [ER+]: Secondary | ICD-10-CM | POA: Diagnosis not present

## 2021-02-12 DIAGNOSIS — M797 Fibromyalgia: Secondary | ICD-10-CM | POA: Insufficient documentation

## 2021-02-12 DIAGNOSIS — I10 Essential (primary) hypertension: Secondary | ICD-10-CM | POA: Insufficient documentation

## 2021-02-12 DIAGNOSIS — E559 Vitamin D deficiency, unspecified: Secondary | ICD-10-CM | POA: Diagnosis not present

## 2021-02-12 DIAGNOSIS — M1990 Unspecified osteoarthritis, unspecified site: Secondary | ICD-10-CM | POA: Diagnosis not present

## 2021-02-12 DIAGNOSIS — Z6841 Body Mass Index (BMI) 40.0 and over, adult: Secondary | ICD-10-CM | POA: Insufficient documentation

## 2021-02-12 DIAGNOSIS — R519 Headache, unspecified: Secondary | ICD-10-CM | POA: Diagnosis not present

## 2021-02-12 DIAGNOSIS — Z96611 Presence of right artificial shoulder joint: Secondary | ICD-10-CM

## 2021-02-12 DIAGNOSIS — G8918 Other acute postprocedural pain: Secondary | ICD-10-CM | POA: Diagnosis not present

## 2021-02-12 DIAGNOSIS — E119 Type 2 diabetes mellitus without complications: Secondary | ICD-10-CM | POA: Insufficient documentation

## 2021-02-12 DIAGNOSIS — E1169 Type 2 diabetes mellitus with other specified complication: Secondary | ICD-10-CM

## 2021-02-12 HISTORY — PX: REVERSE SHOULDER ARTHROPLASTY: SHX5054

## 2021-02-12 LAB — BASIC METABOLIC PANEL
Anion gap: 9 (ref 5–15)
BUN: 10 mg/dL (ref 8–23)
CO2: 31 mmol/L (ref 22–32)
Calcium: 9.2 mg/dL (ref 8.9–10.3)
Chloride: 96 mmol/L — ABNORMAL LOW (ref 98–111)
Creatinine, Ser: 0.6 mg/dL (ref 0.44–1.00)
GFR, Estimated: >60 mL/min (ref >60)
Glucose, Bld: 139 mg/dL — ABNORMAL HIGH (ref 70–99)
Potassium: 3.5 mmol/L (ref 3.5–5.1)
Sodium: 136 mmol/L (ref 135–145)

## 2021-02-12 LAB — CBC
HCT: 35.2 % — ABNORMAL LOW (ref 36.0–46.0)
Hemoglobin: 11.1 g/dL — ABNORMAL LOW (ref 12.0–15.0)
MCH: 28 pg (ref 26.0–34.0)
MCHC: 31.5 g/dL (ref 30.0–36.0)
MCV: 88.9 fL (ref 80.0–100.0)
Platelets: 439 10*3/uL — ABNORMAL HIGH (ref 150–400)
RBC: 3.96 MIL/uL (ref 3.87–5.11)
RDW: 16.3 % — ABNORMAL HIGH (ref 11.5–15.5)
WBC: 12.1 10*3/uL — ABNORMAL HIGH (ref 4.0–10.5)
nRBC: 0 % (ref 0.0–0.2)

## 2021-02-12 LAB — GLUCOSE, CAPILLARY
Glucose-Capillary: 139 mg/dL — ABNORMAL HIGH (ref 70–99)
Glucose-Capillary: 181 mg/dL — ABNORMAL HIGH (ref 70–99)

## 2021-02-12 SURGERY — ARTHROPLASTY, SHOULDER, TOTAL, REVERSE
Anesthesia: General | Site: Shoulder | Laterality: Right

## 2021-02-12 MED ORDER — OXYCODONE-ACETAMINOPHEN 5-325 MG PO TABS: 1.0000 | ORAL_TABLET | Freq: Four times a day (QID) | ORAL | 0 refills | Status: DC | PRN

## 2021-02-12 MED ORDER — ORAL CARE MOUTH RINSE
15.0000 mL | Freq: Once | OROMUCOSAL | Status: AC
  Administered 2021-02-12: 15 mL via OROMUCOSAL

## 2021-02-12 MED ORDER — TRANEXAMIC ACID-NACL 1000-0.7 MG/100ML-% IV SOLN
1000.0000 mg | INTRAVENOUS | Status: AC
  Administered 2021-02-12: 1000 mg via INTRAVENOUS
  Filled 2021-02-12: qty 100

## 2021-02-12 MED ORDER — ONDANSETRON HCL 4 MG/2ML IJ SOLN
INTRAMUSCULAR | Status: AC
  Filled 2021-02-12: qty 2

## 2021-02-12 MED ORDER — FENTANYL CITRATE PF 50 MCG/ML IJ SOSY: 25.0000 ug | PREFILLED_SYRINGE | INTRAMUSCULAR | Status: DC | PRN

## 2021-02-12 MED ORDER — ONDANSETRON HCL 4 MG/2ML IJ SOLN
INTRAMUSCULAR | Status: DC | PRN
  Administered 2021-02-12: 4 mg via INTRAVENOUS

## 2021-02-12 MED ORDER — PROPOFOL 10 MG/ML IV BOLUS
INTRAVENOUS | Status: AC
  Filled 2021-02-12: qty 20

## 2021-02-12 MED ORDER — ALBUMIN HUMAN 5 % IV SOLN: INTRAVENOUS | Status: DC | PRN

## 2021-02-12 MED ORDER — CEFAZOLIN SODIUM-DEXTROSE 2-4 GM/100ML-% IV SOLN
2.0000 g | INTRAVENOUS | Status: AC
  Administered 2021-02-12: 2 g via INTRAVENOUS
  Filled 2021-02-12: qty 100

## 2021-02-12 MED ORDER — PROPOFOL 10 MG/ML IV BOLUS
INTRAVENOUS | Status: DC | PRN
  Administered 2021-02-12: 180 mg via INTRAVENOUS

## 2021-02-12 MED ORDER — HYDROCODONE-ACETAMINOPHEN 7.5-325 MG PO TABS: 1.0000 | ORAL_TABLET | ORAL | Status: DC | PRN

## 2021-02-12 MED ORDER — PHENYLEPHRINE HCL-NACL 20-0.9 MG/250ML-% IV SOLN
INTRAVENOUS | Status: DC | PRN
  Administered 2021-02-12: 25 ug/min via INTRAVENOUS

## 2021-02-12 MED ORDER — BUPIVACAINE LIPOSOME 1.3 % IJ SUSP
INTRAMUSCULAR | Status: DC | PRN
  Administered 2021-02-12: 10 mL via PERINEURAL

## 2021-02-12 MED ORDER — SCOPOLAMINE 1 MG/3DAYS TD PT72
MEDICATED_PATCH | TRANSDERMAL | Status: AC
  Filled 2021-02-12: qty 1

## 2021-02-12 MED ORDER — BUPIVACAINE HCL (PF) 0.5 % IJ SOLN
INTRAMUSCULAR | Status: DC | PRN
  Administered 2021-02-12: 15 mL via PERINEURAL

## 2021-02-12 MED ORDER — CHLORHEXIDINE GLUCONATE 0.12 % MT SOLN: 15.0000 mL | Freq: Once | OROMUCOSAL | Status: AC

## 2021-02-12 MED ORDER — PHENYLEPHRINE 40 MCG/ML (10ML) SYRINGE FOR IV PUSH (FOR BLOOD PRESSURE SUPPORT)
PREFILLED_SYRINGE | INTRAVENOUS | Status: DC | PRN
  Administered 2021-02-12: 80 ug via INTRAVENOUS

## 2021-02-12 MED ORDER — METHOCARBAMOL 500 MG IVPB - SIMPLE MED: 500.0000 mg | Freq: Four times a day (QID) | INTRAVENOUS | Status: DC | PRN

## 2021-02-12 MED ORDER — ROCURONIUM BROMIDE 10 MG/ML (PF) SYRINGE
PREFILLED_SYRINGE | INTRAVENOUS | Status: DC | PRN
  Administered 2021-02-12: 100 mg via INTRAVENOUS

## 2021-02-12 MED ORDER — METHOCARBAMOL 500 MG PO TABS: 500.0000 mg | ORAL_TABLET | Freq: Four times a day (QID) | ORAL | Status: DC | PRN

## 2021-02-12 MED ORDER — LACTATED RINGERS IV SOLN: INTRAVENOUS | Status: DC

## 2021-02-12 MED ORDER — MIDAZOLAM HCL 2 MG/2ML IJ SOLN
INTRAMUSCULAR | Status: AC
  Filled 2021-02-12: qty 2

## 2021-02-12 MED ORDER — ONDANSETRON HCL 4 MG/2ML IJ SOLN: 4.0000 mg | Freq: Once | INTRAMUSCULAR | Status: DC | PRN

## 2021-02-12 MED ORDER — HYDROCODONE-ACETAMINOPHEN 5-325 MG PO TABS: 1.0000 | ORAL_TABLET | ORAL | Status: DC | PRN

## 2021-02-12 MED ORDER — SCOPOLAMINE 1 MG/3DAYS TD PT72
MEDICATED_PATCH | TRANSDERMAL | Status: DC | PRN
  Administered 2021-02-12: 1 via TRANSDERMAL

## 2021-02-12 MED ORDER — LIDOCAINE 2% (20 MG/ML) 5 ML SYRINGE
INTRAMUSCULAR | Status: DC | PRN
  Administered 2021-02-12: 100 mg via INTRAVENOUS

## 2021-02-12 MED ORDER — DEXAMETHASONE SODIUM PHOSPHATE 10 MG/ML IJ SOLN
INTRAMUSCULAR | Status: DC | PRN
  Administered 2021-02-12: 4 mg via INTRAVENOUS

## 2021-02-12 MED ORDER — EPHEDRINE SULFATE-NACL 50-0.9 MG/10ML-% IV SOSY
PREFILLED_SYRINGE | INTRAVENOUS | Status: DC | PRN
  Administered 2021-02-12: 10 mg via INTRAVENOUS
  Administered 2021-02-12 (×2): 5 mg via INTRAVENOUS

## 2021-02-12 MED ORDER — FENTANYL CITRATE (PF) 100 MCG/2ML IJ SOLN
INTRAMUSCULAR | Status: AC
  Filled 2021-02-12: qty 2

## 2021-02-12 MED ORDER — DEXAMETHASONE SODIUM PHOSPHATE 10 MG/ML IJ SOLN
INTRAMUSCULAR | Status: AC
  Filled 2021-02-12: qty 1

## 2021-02-12 MED ORDER — 0.9 % SODIUM CHLORIDE (POUR BTL) OPTIME
TOPICAL | Status: DC | PRN
  Administered 2021-02-12: 1000 mL

## 2021-02-12 MED ORDER — SODIUM CHLORIDE (PF) 0.9 % IJ SOLN
INTRAMUSCULAR | Status: AC
  Filled 2021-02-12: qty 20

## 2021-02-12 MED ORDER — EPHEDRINE 5 MG/ML INJ
INTRAVENOUS | Status: AC
  Filled 2021-02-12: qty 5

## 2021-02-12 MED ORDER — ROCURONIUM BROMIDE 10 MG/ML (PF) SYRINGE
PREFILLED_SYRINGE | INTRAVENOUS | Status: AC
  Filled 2021-02-12: qty 10

## 2021-02-12 MED ORDER — ACETAMINOPHEN 10 MG/ML IV SOLN: 1000.0000 mg | Freq: Once | INTRAVENOUS | Status: DC | PRN

## 2021-02-12 MED ORDER — SUGAMMADEX SODIUM 200 MG/2ML IV SOLN
INTRAVENOUS | Status: DC | PRN
  Administered 2021-02-12: 300 mg via INTRAVENOUS

## 2021-02-12 MED ORDER — HYDROCODONE-ACETAMINOPHEN 10-325 MG PO TABS: 1.0000 | ORAL_TABLET | ORAL | Status: DC | PRN

## 2021-02-12 MED ORDER — SODIUM CHLORIDE 0.9 % IR SOLN
Status: DC | PRN
  Administered 2021-02-12: 1000 mL

## 2021-02-12 MED ORDER — MIDAZOLAM HCL 5 MG/5ML IJ SOLN
INTRAMUSCULAR | Status: DC | PRN
  Administered 2021-02-12: 1 mg via INTRAVENOUS

## 2021-02-12 MED ORDER — FENTANYL CITRATE (PF) 100 MCG/2ML IJ SOLN
INTRAMUSCULAR | Status: DC | PRN
  Administered 2021-02-12 (×2): 25 ug via INTRAVENOUS

## 2021-02-12 MED ORDER — WATER FOR IRRIGATION, STERILE IR SOLN
Status: DC | PRN
  Administered 2021-02-12: 2000 mL

## 2021-02-12 MED ORDER — VASOPRESSIN 20 UNIT/ML IV SOLN
INTRAVENOUS | Status: AC
  Filled 2021-02-12: qty 1

## 2021-02-12 MED ORDER — LIDOCAINE HCL (PF) 2 % IJ SOLN
INTRAMUSCULAR | Status: AC
  Filled 2021-02-12: qty 5

## 2021-02-12 MED ORDER — PHENYLEPHRINE HCL-NACL 20-0.9 MG/250ML-% IV SOLN
INTRAVENOUS | Status: AC
  Filled 2021-02-12: qty 250

## 2021-02-12 MED ORDER — VASOPRESSIN 20 UNIT/ML IV SOLN
INTRAVENOUS | Status: DC | PRN
  Administered 2021-02-12 (×2): 1 [IU] via INTRAVENOUS

## 2021-02-12 SURGICAL SUPPLY — 76 items
BAG COUNTER SPONGE SURGICOUNT (BAG) IMPLANT
BAG SURGICOUNT SPONGE COUNTING (BAG)
BAG ZIPLOCK 12X15 (MISCELLANEOUS) ×3 IMPLANT
BASEPLATE P2 COATD GLND 6.5X30 (Shoulder) ×1 IMPLANT
BIT DRILL 1.6MX128 (BIT) IMPLANT
BIT DRILL 1.6MX128MM (BIT)
BIT DRILL 2.5 DIA 127 CALI (BIT) ×3 IMPLANT
BIT DRILL 4 DIA CALIBRATED (BIT) ×3 IMPLANT
BLADE SAW SAG 73X25 THK (BLADE) ×2
BLADE SAW SGTL 73X25 THK (BLADE) ×1 IMPLANT
BOOTIES KNEE HIGH SLOAN (MISCELLANEOUS) ×6 IMPLANT
CLOSURE WOUND 1/2 X4 (GAUZE/BANDAGES/DRESSINGS) ×2
COOLER ICEMAN CLASSIC (MISCELLANEOUS) IMPLANT
COVER BACK TABLE 60X90IN (DRAPES) ×3 IMPLANT
COVER SURGICAL LIGHT HANDLE (MISCELLANEOUS) ×3 IMPLANT
DRAPE INCISE IOBAN 66X45 STRL (DRAPES) ×3 IMPLANT
DRAPE ORTHO SPLIT 77X108 STRL (DRAPES) ×6
DRAPE POUCH INSTRU U-SHP 10X18 (DRAPES) ×3 IMPLANT
DRAPE SHEET LG 3/4 BI-LAMINATE (DRAPES) ×3 IMPLANT
DRAPE SURG 17X11 SM STRL (DRAPES) ×3 IMPLANT
DRAPE SURG ORHT 6 SPLT 77X108 (DRAPES) ×2 IMPLANT
DRAPE TOP 10253 STERILE (DRAPES) ×3 IMPLANT
DRAPE U-SHAPE 47X51 STRL (DRAPES) ×3 IMPLANT
DRSG AQUACEL AG ADV 3.5X 6 (GAUZE/BANDAGES/DRESSINGS) ×3 IMPLANT
DURAPREP 26ML APPLICATOR (WOUND CARE) ×6 IMPLANT
ELECT BLADE TIP CTD 4 INCH (ELECTRODE) ×3 IMPLANT
ELECT REM PT RETURN 15FT ADLT (MISCELLANEOUS) ×3 IMPLANT
GLOVE SRG 8 PF TXTR STRL LF DI (GLOVE) ×1 IMPLANT
GLOVE SURG ENC MOIS LTX SZ7.5 (GLOVE) ×3 IMPLANT
GLOVE SURG POLYISO LF SZ6.5 (GLOVE) ×3 IMPLANT
GLOVE SURG UNDER POLY LF SZ6.5 (GLOVE) ×3 IMPLANT
GLOVE SURG UNDER POLY LF SZ8 (GLOVE) ×3
GOWN STRL REUS W/TWL LRG LVL3 (GOWN DISPOSABLE) ×3 IMPLANT
GOWN STRL REUS W/TWL XL LVL3 (GOWN DISPOSABLE) ×3 IMPLANT
HANDPIECE INTERPULSE COAX TIP (DISPOSABLE) ×3
HOOD PEEL AWAY FLYTE STAYCOOL (MISCELLANEOUS) ×9 IMPLANT
HUMERA STEM SM SHELL SHOU 10 (Miscellaneous) ×3 IMPLANT
INSERT SMALL SOCKET 32MM NEU (Insert) ×3 IMPLANT
KIT BASIN OR (CUSTOM PROCEDURE TRAY) ×3 IMPLANT
KIT TURNOVER KIT A (KITS) IMPLANT
MANIFOLD NEPTUNE II (INSTRUMENTS) ×3 IMPLANT
NEEDLE TROCAR POINT SZ 2 1/2 (NEEDLE) IMPLANT
NS IRRIG 1000ML POUR BTL (IV SOLUTION) ×3 IMPLANT
P2 COATDE GLNOID BSEPLT 6.5X30 (Shoulder) ×3 IMPLANT
PACK SHOULDER (CUSTOM PROCEDURE TRAY) ×3 IMPLANT
PAD COLD SHLDR WRAP-ON (PAD) IMPLANT
PROTECTOR NERVE ULNAR (MISCELLANEOUS) IMPLANT
RESTRAINT HEAD UNIVERSAL NS (MISCELLANEOUS) IMPLANT
RETRIEVER SUT HEWSON (MISCELLANEOUS) IMPLANT
SCREW BONE LOCKING RSP 5.0X14 (Screw) ×6 IMPLANT
SCREW BONE LOCKING RSP 5.0X30 (Screw) ×6 IMPLANT
SCREW BONE RSP LOCK 5X14 (Screw) ×2 IMPLANT
SCREW BONE RSP LOCK 5X30 (Screw) ×2 IMPLANT
SCREW RETAIN W/HEAD 4MM OFFSET (Shoulder) ×3 IMPLANT
SET HNDPC FAN SPRY TIP SCT (DISPOSABLE) ×1 IMPLANT
SLING ARM FOAM STRAP LRG (SOFTGOODS) ×3 IMPLANT
SLING ARM IMMOBILIZER LRG (SOFTGOODS) IMPLANT
SLING ARM IMMOBILIZER MED (SOFTGOODS) IMPLANT
SPONGE T-LAP 18X18 ~~LOC~~+RFID (SPONGE) ×3 IMPLANT
STEM HUMERAL SM SHELL SHOU 10 (Miscellaneous) ×1 IMPLANT
STRIP CLOSURE SKIN 1/2X4 (GAUZE/BANDAGES/DRESSINGS) ×4 IMPLANT
SUCTION FRAZIER HANDLE 10FR (MISCELLANEOUS)
SUCTION TUBE FRAZIER 10FR DISP (MISCELLANEOUS) IMPLANT
SUPPORT WRAP ARM LG (MISCELLANEOUS) IMPLANT
SUT ETHIBOND 2 V 37 (SUTURE) IMPLANT
SUT FIBERWIRE #2 38 REV NDL BL (SUTURE)
SUT MNCRL AB 4-0 PS2 18 (SUTURE) ×3 IMPLANT
SUT VIC AB 2-0 CT1 27 (SUTURE) ×3
SUT VIC AB 2-0 CT1 TAPERPNT 27 (SUTURE) ×1 IMPLANT
SUTURE FIBERWR#2 38 REV NDL BL (SUTURE) IMPLANT
TAPE LABRALWHITE 1.5X36 (TAPE) IMPLANT
TAPE STRIPS DRAPE STRL (GAUZE/BANDAGES/DRESSINGS) ×3 IMPLANT
TAPE SUT LABRALTAP WHT/BLK (SUTURE) IMPLANT
TOWEL OR 17X26 10 PK STRL BLUE (TOWEL DISPOSABLE) ×3 IMPLANT
TOWEL OR NON WOVEN STRL DISP B (DISPOSABLE) ×3 IMPLANT
WATER STERILE IRR 1000ML POUR (IV SOLUTION) ×3 IMPLANT

## 2021-02-12 NOTE — Anesthesia Postprocedure Evaluation (Signed)
Anesthesia Post Note  Patient: Brittany Acosta  Procedure(s) Performed: REVERSE SHOULDER ARTHROPLASTY (Right: Shoulder)     Patient location during evaluation: PACU Anesthesia Type: General Level of consciousness: awake and alert Pain management: pain level controlled Vital Signs Assessment: post-procedure vital signs reviewed and stable Respiratory status: spontaneous breathing, nonlabored ventilation, respiratory function stable and patient connected to nasal cannula oxygen Cardiovascular status: blood pressure returned to baseline and stable Postop Assessment: no apparent nausea or vomiting Anesthetic complications: no   No notable events documented.  Last Vitals:  Vitals:   02/12/21 1030 02/12/21 1039  BP: (!) 142/60 (!) 144/62  Pulse: 72 76  Resp: 17 20  Temp:    SpO2: 94% 98%    Last Pain:  Vitals:   02/12/21 1039  TempSrc:   PainSc: 0-No pain                 Barnet Glasgow

## 2021-02-12 NOTE — Op Note (Signed)
Procedure(s): REVERSE SHOULDER ARTHROPLASTY Procedure Note  Brittany Acosta female 70 y.o. 02/12/2021  Preoperative diagnosis: Right shoulder rotator cuff tear arthropathy  Postoperative diagnosis: Same  Procedure(s) and Anesthesia Type:    * REVERSE SHOULDER ARTHROPLASTY - Choice   Indications:  70 y.o. female  With advanced right shoulder arthritis with severe rotator cuff disease status post 2 prior surgeries. Pain and dysfunction interfered with quality of life and nonoperative treatment with activity modification, NSAIDS and injections failed.     Surgeon: Rhae Hammock   Assistants: Sheryle Hail PA-C Brittany Acosta was present and scrubbed throughout the procedure and was essential in positioning, retraction, exposure, and closure)  Anesthesia: General endotracheal anesthesia with preoperative interscalene block given by the attending anesthesiologist     Procedure Detail  REVERSE SHOULDER ARTHROPLASTY   Estimated Blood Loss:  200 mL         Drains: none  Blood Given: none          Specimens: none        Complications:  * No complications entered in OR log *         Disposition: PACU - hemodynamically stable.         Condition: stable      OPERATIVE FINDINGS:  A DJO Altivate pressfit reverse total shoulder arthroplasty was placed with a  size 10 stem, a 32-4 glenosphere, and a standard-mm poly insert. The base plate  fixation was excellent.  PROCEDURE: The patient was identified in the preoperative holding area  where I personally marked the operative site after verifying site, side,  and procedure with the patient. An interscalene block given by  the attending anesthesiologist in the holding area and the patient was taken back to the operating room where all extremities were  carefully padded in position after general anesthesia was induced. She  was placed in a beach-chair position and the operative upper extremity was  prepped and draped in a standard  sterile fashion. An approximately 10-  cm incision was made from the tip of the coracoid process to the center  point of the humerus at the level of the axilla. Dissection was carried  down through subcutaneous tissues to the level of the cephalic vein  which was taken laterally with the deltoid. The pectoralis major was  retracted medially. The subdeltoid space was developed and the lateral  edge of the conjoined tendon was identified. The undersurface of  conjoined tendon was palpated and the musculocutaneous nerve was not in  the field. Retractor was placed underneath the conjoined and second  retractor was placed lateral into the deltoid. The circumflex humeral  artery and vessels were identified and clamped and coagulated. The  biceps tendon was absent.  The subscapularis was taken down as a peel with the underlying capsule.  The  joint was then gently externally rotated while the capsule was released  from the humeral neck around to just beyond the 6 o'clock position. At  this point, the joint was dislocated and the humeral head was presented  into the wound. The excessive osteophyte formation was removed with a  large rongeur.  The cutting guide was used to make the appropriate  head cut and the head was saved for potentially bone grafting.  The glenoid was exposed with the arm in an  abducted extended position. The anterior and posterior labrum were  completely excised and the capsule was released circumferentially to  allow for exposure of the glenoid for preparation. The 2.5 mm  drill was  placed using the guide in 5-10 inferior angulation and the tap was then advanced in the same hole. Small and large reamers were then used. The tap was then removed and the Metaglene was then screwed in with excellent purchase.  The peripheral guide was then used to drilled measured and filled peripheral locking screws. The size 32-4 glenosphere was then impacted on the [facility] taper and the central  screw was placed. The humerus was then again exposed and the diaphyseal reamers were used followed by the metaphyseal reamers. The final broach was left in place in the proximal trial was placed. The joint was reduced and with this implant it was felt that soft tissue tensioning was appropriate with excellent stability and excellent range of motion. Therefore, final humeral stem was placed press-fit.  And then the trial polyethylene inserts were tested again and the above implant was felt to be the most appropriate for final insertion. The joint was reduced taken through full range of motion and felt to be stable. Soft tissue tension was appropriate.  The joint was then copiously irrigated with pulse  lavage and the wound was then closed. The subscapularis was not repaired.  Skin was closed with 2-0 Vicryl in a deep dermal layer and 4-0  Monocryl for skin closure. Steri-Strips were applied. Sterile  dressings were then applied as well as a sling. The patient was allowed  to awaken from general anesthesia, transferred to stretcher, and taken  to recovery room in stable condition.   POSTOPERATIVE PLAN: The patient will be observed in the recovery room.  If she is hemodynamically stable and has recovered well from surgery and has adequate pain control with her regional block she could go home today with family.  If there is any concern we will keep her overnight for 23-hour observation.

## 2021-02-12 NOTE — Transfer of Care (Signed)
Immediate Anesthesia Transfer of Care Note  Patient: Brittany Acosta  Procedure(s) Performed: REVERSE SHOULDER ARTHROPLASTY (Right: Shoulder)  Patient Location: PACU  Anesthesia Type:General  Level of Consciousness: awake, alert , oriented and patient cooperative  Airway & Oxygen Therapy: Patient Spontanous Breathing and Patient connected to face mask oxygen  Post-op Assessment: Report given to RN, Post -op Vital signs reviewed and stable and Patient moving all extremities  Post vital signs: Reviewed and stable  Last Vitals:  Vitals Value Taken Time  BP 141/64 02/12/21 0908  Temp    Pulse 87 02/12/21 0914  Resp 16 02/12/21 0913  SpO2 100 % 02/12/21 0914  Vitals shown include unvalidated device data.  Last Pain:  Vitals:   02/12/21 0612  TempSrc: Oral         Complications: No notable events documented.

## 2021-02-12 NOTE — H&P (Signed)
Brittany Acosta is an 70 y.o. female.   Chief Complaint: R shoulder pain and dysfunction HPI: R shoulder advance rotator cuff tear arthropathy, failed conservative tx.  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[place])    Nontoxic multinodular goiter    Osteoporosis    Pneumonia    PONV (postoperative nausea and vomiting)    PTSD (post-traumatic stress disorder)    Pure hypercholesterolemia    RBBB    Scleroderma ([place])    Sleep apnea 10/2012   mild osa-did not need cpap -dr clance   Type II diabetes mellitus ([place])    Unspecified hypothyroidism     Past Surgical History:  Procedure Laterality Date   ABDOMINAL HYSTERECTOMY  1976   BREAST EXCISIONAL BIOPSY Left    BREAST LUMPECTOMY WITH RADIOACTIVE SEED AND SENTINEL LYMPH NODE BIOPSY Left 04/25/2018   Procedure: LEFT BREAST LUMPECTOMY WITH RADIOACTIVE SEED AND SENTINEL LYMPH NODE BIOPSY;  Surgeon: Excell Seltzer, MD;  Location: [facility];  Service: General;  Laterality: Left;   [place]  2001   sees Dr Peter Martinique   CATARACT EXTRACTION  2014   CATARACT EXTRACTION, BILATERAL  02/2011   epps   CHOLECYSTECTOMY  1985   EXCISIONAL HEMORRHOIDECTOMY     "dr cut out in his office" (02/08/2012)   [place]  2000   right   JOINT REPLACEMENT     rt knee   KNEE ARTHROSCOPY  08/2009   right   KNEE ARTHROSCOPY WITH MEDIAL MENISECTOMY      left   Left Cystoscopy   [place]   Lithotripsy (L) Kidney  1997   LUMBAR LAMINECTOMY/DECOMPRESSION MICRODISCECTOMY Left 07/01/2016   Procedure: Laminectomy for synovial cyst - left - Lumbar four-five;  Surgeon: Earnie Larsson, MD;  Location: [facility];  Service: Neurosurgery;  Laterality: Left;   PARTIAL MASTECTOMY WITH NEEDLE LOCALIZATION Left 09/01/2012   Procedure: PARTIAL MASTECTOMY WITH NEEDLE LOCALIZATION;  Surgeon: Adin Hector, MD;  Location: [facility];  Service: General;  Laterality: Left;   [place] stone removed (L) Kidney  1992   Right nasal surgery  08/1988   Right sinus removed  08/1989   tooth partial   ROTATOR CUFF REPAIR  2013   right shoulder x 2   SHOULDER ARTHROSCOPY WITH ROTATOR CUFF REPAIR AND SUBACROMIAL DECOMPRESSION Left 08/20/2013   Procedure: SHOULDER ARTHROSCOPY  AND SUBACROMIAL DECOMPRESSION;  Surgeon: Nita Sells, MD;  Location: [facility];  Service: Orthopedics;  Laterality: Left;  Left shoulder arthroscopy, debridement, subacromial decompression, distal clavical resection   THYROIDECTOMY  04/22/2011   Procedure: THYROIDECTOMY;  Surgeon: Onnie Graham, MD;  Location: [facility];  Service: ENT;  Laterality: N/A;  TOTAL THYROIDECOTMY  TOTAL KNEE ARTHROPLASTY  06/18/2011   Procedure: TOTAL KNEE ARTHROPLASTY;  Surgeon: Kerin Salen, MD;  Location: [facility];  Service: Orthopedics;  Laterality: Left;  DEPUY   TOTAL KNEE ARTHROPLASTY  02/07/2012   Procedure: TOTAL KNEE ARTHROPLASTY;  Surgeon: Kerin Salen, MD;  Location: [facility];  Service: Orthopedics;  Laterality: Right;   TUBAL LIGATION  1972    Family History  Problem Relation Age of Onset   Hypertension Mother    Lung cancer Mother        non-smoker   Diabetes Father    Hypertension Father    Hyperlipidemia Father    Heart attack Other    Coronary artery disease Other    Hypertension Sister    Hypertension Brother    Hypertension Brother    Hypertension Brother     Hypertension Sister    Hypertension Sister    Diabetes Daughter    Social History:  reports that she quit smoking about 37 years ago. Her smoking use included cigarettes. She has a 6.00 pack-year smoking history. She has never used smokeless tobacco. She reports that she does not drink alcohol and does not use drugs.  Allergies: No Known Allergies  Medications Prior to Admission  Medication Sig Dispense Refill   allopurinol (ZYLOPRIM) 300 MG tablet TAKE 1 TABLET (300 MG TOTAL) BY MOUTH DAILY. 90 tablet 0   anastrozole (ARIMIDEX) 1 MG tablet TAKE 1 TABLET EVERY DAY 90 tablet 0   aspirin EC 81 MG tablet Take 1 tablet (81 mg total) by mouth daily. Swallow whole. 90 tablet 3   Biotin 10 MG CAPS Take 10 mg by mouth at bedtime.     cetirizine (ZYRTEC) 10 MG tablet Take 20 mg by mouth daily.      Cholecalciferol (VITAMIN D3) 50 MCG (2000 UT) TABS Take 2,000 Units by mouth daily.      cyclobenzaprine (FLEXERIL) 5 MG tablet Take 1 tablet (5 mg total) by mouth at bedtime as needed for muscle spasms. 90 tablet 0   doxepin (SINEQUAN) 10 MG capsule TAKE 1 CAPSULE EVERY DAY 90 capsule 0   fluticasone (FLONASE) 50 MCG/ACT nasal spray USE 2 SPRAYS IN EACH NOSTRIL EVERY DAY 48 g 1   folic acid (FOLVITE) 1 MG tablet Take 2 mg by mouth daily.     furosemide (LASIX) 40 MG tablet Take 1 tablet (40 mg total) by mouth daily. 90 tablet 3   Insulin Pen Needle (DROPLET PEN NEEDLES) 31G X 5 MM MISC USE TO INJECT INSULIN DAILY 100 each 3   lamoTRIgine (LAMICTAL) 200 MG tablet Take 1 tablet (200 mg total) by mouth daily at 12 noon. 90 tablet 3   levothyroxine (SYNTHROID, LEVOTHROID) 50 MCG tablet Take 1 tablet (50 mcg total) by mouth daily. --- Office visit needed for further refills 90 tablet 0   lisinopril (ZESTRIL) 5 MG tablet TAKE 1 TABLET EVERY DAY 90 tablet 1   metFORMIN (GLUCOPHAGE) 500 MG tablet Take 1000 mg in the AM and 500 mg in the PM. 360 tablet 1   methotrexate 2.5 MG tablet Take 20 mg by mouth every  Saturday.     metoprolol tartrate (LOPRESSOR) 50 MG tablet Take 1-2 hours prior to procedure 1 tablet 0   Multiple Vitamin (MULTIVITAMIN WITH MINERALS) TABS tablet Take 1 tablet by mouth in the morning and at bedtime.     omeprazole (PRILOSEC) 20 MG capsule Take 1 capsule (20 mg total) by mouth daily. Take 30 minutes before a meal  90 capsule 3   oxybutynin (DITROPAN-XL) 10 MG 24 hr tablet Take 1 tablet (10 mg total) by mouth every evening. (Patient taking differently: Take 10 mg by mouth at bedtime.) 90 tablet 3   potassium citrate (UROCIT-K) 10 MEQ (1080 MG) SR tablet 10 mEq.     pravastatin (PRAVACHOL) 20 MG tablet Take 20 mg by mouth at bedtime.     predniSONE (DELTASONE) 5 MG tablet Take 5 mg by mouth daily with breakfast.      pregabalin (LYRICA) 50 MG capsule Take 1 capsule (50 mg total) by mouth 2 (two) times daily. 180 capsule 1   saw palmetto 500 MG capsule Take 500 mg by mouth daily.     sertraline (ZOLOFT) 100 MG tablet Take 100 mg by mouth daily.      sulfaSALAzine (AZULFIDINE) 500 MG tablet Take 1,000 mg by mouth daily.     tamsulosin (FLOMAX) 0.4 MG CAPS capsule Take 0.4 mg by mouth daily.     TRESIBA FLEXTOUCH 100 UNIT/ML FlexTouch Pen INJECT 26 UNITS SUBCUTANEOUSLY DAILY 30 mL 1   vitamin A 3 MG (10000 UNITS) capsule Take 10,000 Units by mouth daily.      Results for orders placed or performed during the hospital encounter of 02/12/21 (from the past 48 hour(s))  Glucose, capillary     Status: Abnormal   Collection Time: 02/12/21  6:07 AM  Result Value Ref Range   Glucose-Capillary 139 (H) 70 - 99 mg/dL    Comment: Glucose reference range applies only to samples taken after fasting for at least 8 hours.   Comment 1 Notify RN   Basic metabolic panel per protocol     Status: Abnormal   Collection Time: 02/12/21  6:15 AM  Result Value Ref Range   Sodium 136 135 - 145 mmol/L   Potassium 3.5 3.5 - 5.1 mmol/L   Chloride 96 (L) 98 - 111 mmol/L   CO2 31 22 - 32 mmol/L   Glucose,  Bld 139 (H) 70 - 99 mg/dL    Comment: Glucose reference range applies only to samples taken after fasting for at least 8 hours.   BUN 10 8 - 23 mg/dL   Creatinine, Ser 0.60 0.44 - 1.00 mg/dL   Calcium 9.2 8.9 - 10.3 mg/dL   GFR, Estimated >60 >60 mL/min    Comment: (NOTE) Calculated using the CKD-EPI Creatinine Equation (2021)    Anion gap 9 5 - 15    Comment: Performed at [facility], [place] [address] [place], [place] [address]  CBC per protocol     Status: Abnormal   Collection Time: 02/12/21  6:15 AM  Result Value Ref Range   WBC 12.1 (H) 4.0 - 10.5 K/uL   RBC 3.96 3.87 - 5.11 MIL/uL   Hemoglobin 11.1 (L) 12.0 - 15.0 g/dL   HCT 35.2 (L) 36.0 - 46.0 %   MCV 88.9 80.0 - 100.0 fL   MCH 28.0 26.0 - 34.0 pg   MCHC 31.5 30.0 - 36.0 g/dL   RDW 16.3 (H) 11.5 - 15.5 %   Platelets 439 (H) 150 - 400 K/uL   nRBC 0.0 0.0 - 0.2 %    Comment: Performed at [facility], [place] [address] [place], [place] [address]   No results found.  Review of Systems  All other systems reviewed and are negative.  Blood pressure (!) 174/73, pulse 93, temperature 97.6 F (36.4 C), temperature source Oral, resp. rate 18, SpO2 99 %. Physical  Exam Constitutional:      Appearance: She is obese.  HENT:     Head: Atraumatic.  Eyes:     Extraocular Movements: Extraocular movements intact.  Cardiovascular:     Pulses: Normal pulses.  Musculoskeletal:     Comments: RUE pain with limited ROM. NVID.  Neurological:     Mental Status: She is alert.  Psychiatric:        Mood and Affect: Mood normal.     Assessment/Plan R shoulder advance rotator cuff tear arthropathy, failed conservative tx. Plan R reverse TSA Risks / benefits of surgery discussed Consent on chart  NPO for OR Preop antibiotics   Rhae Hammock, MD 02/12/2021, 7:15 AM

## 2021-02-12 NOTE — Anesthesia Procedure Notes (Signed)
Anesthesia Regional Block: Interscalene brachial plexus block   Pre-Anesthetic Checklist: , timeout performed,  Correct Patient, Correct Site, Correct Laterality,  Correct Procedure, Correct Position, site marked,  Risks and benefits discussed,  Surgical consent,  Pre-op evaluation,  At surgeon's request and post-op pain management  Laterality: Upper and Right  Prep: Maximum Sterile Barrier Precautions used, chloraprep       Needles:  Injection technique: Single-shot  Needle Type: Echogenic Needle     Needle Length: 5cm  Needle Gauge: 21     Additional Needles:   Procedures:,,,, ultrasound used (permanent image in chart),,    Narrative:  Start time: 02/12/2021 6:50 AM End time: 02/12/2021 7:00 AM Injection made incrementally with aspirations every 5 mL.  Performed by: Personally  Anesthesiologist: Barnet Glasgow, MD  Additional Notes: Block assessed prior to procedure. Patient tolerated procedure well.

## 2021-02-12 NOTE — Progress Notes (Signed)
Pt placed on auto cpap with 4L bled in . Pt o2 sats 96%.

## 2021-02-12 NOTE — Discharge Instructions (Addendum)
Discharge Instructions after Reverse Total Shoulder Arthroplasty   A sling has been provided for you. You are to wear this at all times (except for bathing and dressing), until your first post operative visit with Dr. Tamera Punt. Please also wear while sleeping at night. While you bath and dress, let the arm/elbow extend straight down to stretch your elbow. Wiggle your fingers and pump your first while your in the sling to prevent hand swelling. Use ice on the shoulder intermittently over the first 48 hours after surgery. Continue to use ice or and ice machine as needed after 48 hours for pain control/swelling.  Pain medicine has been prescribed for you.  Use your medicine liberally over the first 48 hours, and then you can begin to taper your use. You may take Extra Strength Tylenol or Tylenol only in place of the pain pills. DO NOT take ANY nonsteroidal anti-inflammatory pain medications: Advil, Motrin, Ibuprofen, Aleve, Naproxen or Naprosyn.  Leave your dressing on until your first follow up visit.  You may shower with the dressing.  Hold your arm as if you still have your sling on while you shower. Simply allow the water to wash over the site and then pat dry. Make sure your axilla (armpit) is completely dry after showering.    Please call [phone] during normal business hours or [phone] after hours for any problems. Including the following:  - excessive redness of the incisions - drainage for more than 4 days - fever of more than 101.5 F  *Please note that pain medications will not be refilled after hours or on weekends.    Dental Antibiotics:  In most cases prophylactic antibiotics for Dental procdeures after total joint surgery are not necessary.  Exceptions are as follows:  1. History of prior total joint infection  2. Severely immunocompromised (Organ Transplant, cancer chemotherapy, Rheumatoid biologic meds such as [place])  3. Poorly controlled diabetes (A1C &gt; 8.0,  blood glucose over 200)  If you have one of these conditions, contact your surgeon for an antibiotic prescription, prior to your dental procedure.

## 2021-02-12 NOTE — Anesthesia Procedure Notes (Addendum)
Procedure Name: Intubation Date/Time: 02/12/2021 7:32 AM Performed by: Victoriano Lain, CRNA Pre-anesthesia Checklist: Patient identified, Emergency Drugs available, Suction available, Patient being monitored and Timeout performed Patient Re-evaluated:Patient Re-evaluated prior to induction Oxygen Delivery Method: Circle system utilized Preoxygenation: Pre-oxygenation with 100% oxygen Induction Type: IV induction Ventilation: Mask ventilation without difficulty Laryngoscope Size: Mac and 4 Grade View: Grade II Tube type: Oral Tube size: 7.0 mm Number of attempts: 1 Airway Equipment and Method: Stylet Placement Confirmation: ETT inserted through vocal cords under direct vision, positive ETCO2 and breath sounds checked- equal and bilateral Secured at: 21 cm Tube secured with: Tape Dental Injury: Teeth and Oropharynx as per pre-operative assessment

## 2021-02-19 ENCOUNTER — Encounter (HOSPITAL_COMMUNITY): Payer: Self-pay | Admitting: Orthopedic Surgery

## 2021-02-25 DIAGNOSIS — R768 Other specified abnormal immunological findings in serum: Secondary | ICD-10-CM | POA: Diagnosis not present

## 2021-02-25 DIAGNOSIS — M064 Inflammatory polyarthropathy: Secondary | ICD-10-CM | POA: Diagnosis not present

## 2021-02-25 DIAGNOSIS — Z79899 Other long term (current) drug therapy: Secondary | ICD-10-CM | POA: Diagnosis not present

## 2021-02-25 DIAGNOSIS — M19011 Primary osteoarthritis, right shoulder: Secondary | ICD-10-CM | POA: Diagnosis not present

## 2021-02-25 DIAGNOSIS — F319 Bipolar disorder, unspecified: Secondary | ICD-10-CM | POA: Diagnosis not present

## 2021-02-25 DIAGNOSIS — E119 Type 2 diabetes mellitus without complications: Secondary | ICD-10-CM | POA: Diagnosis not present

## 2021-02-25 DIAGNOSIS — M109 Gout, unspecified: Secondary | ICD-10-CM | POA: Diagnosis not present

## 2021-02-25 DIAGNOSIS — G629 Polyneuropathy, unspecified: Secondary | ICD-10-CM | POA: Diagnosis not present

## 2021-02-25 DIAGNOSIS — M1990 Unspecified osteoarthritis, unspecified site: Secondary | ICD-10-CM | POA: Diagnosis not present

## 2021-02-25 DIAGNOSIS — M797 Fibromyalgia: Secondary | ICD-10-CM | POA: Diagnosis not present

## 2021-02-25 LAB — COMPREHENSIVE METABOLIC PANEL
Albumin: 3.4 — AB (ref 3.5–5.0)
Calcium: 9.2 (ref 8.7–10.7)
Globulin: 3.5
eGFR: 74

## 2021-02-25 LAB — HEPATIC FUNCTION PANEL
ALT: 18 (ref 7–35)
AST: 23 (ref 13–35)
Alkaline Phosphatase: 132 — AB (ref 25–125)
Bilirubin, Total: 0.2

## 2021-02-25 LAB — CBC AND DIFFERENTIAL
HCT: 32 — AB (ref 36–46)
Hemoglobin: 10.1 — AB (ref 12.0–16.0)
Neutrophils Absolute: 11
Platelets: 660 — AB (ref 150–399)
WBC: 15.3

## 2021-02-25 LAB — BASIC METABOLIC PANEL
BUN: 12 (ref 4–21)
CO2: 34 — AB (ref 13–22)
Chloride: 95 — AB (ref 99–108)
Creatinine: 0.8 (ref 0.5–1.1)
Glucose: 120
Potassium: 4.1 (ref 3.4–5.3)
Sodium: 138 (ref 137–147)

## 2021-02-25 LAB — CBC: RBC: 3.69 — AB (ref 3.87–5.11)

## 2021-02-25 LAB — POCT ERYTHROCYTE SEDIMENTATION RATE, NON-AUTOMATED: Sed Rate: 56

## 2021-03-02 ENCOUNTER — Telehealth: Payer: Self-pay

## 2021-03-02 ENCOUNTER — Ambulatory Visit: Payer: Medicare HMO | Admitting: Physical Therapy

## 2021-03-02 MED ORDER — LEVOTHYROXINE SODIUM 50 MCG PO TABS: 50.0000 ug | ORAL_TABLET | Freq: Every day | ORAL | 1 refills | Status: DC

## 2021-03-02 NOTE — Telephone Encounter (Signed)
LAST APPOINTMENT DATE:  01/30/21  NEXT APPOINTMENT DATE: none  MEDICATION:levothyroxine (SYNTHROID, LEVOTHROID) 50 MCG tablet  PHARMACY: [facility], [place]

## 2021-03-02 NOTE — Telephone Encounter (Signed)
Spoke to pt told her I sent Rx to the mail order pharmacy as requested. Pt verbalized understanding.

## 2021-03-02 NOTE — Telephone Encounter (Signed)
LAST APPOINTMENT DATE:    NEXT APPOINTMENT DATE:  MEDICATION:  PHARMACY:

## 2021-03-16 ENCOUNTER — Ambulatory Visit: Payer: Medicare HMO | Admitting: Physical Therapy

## 2021-03-20 ENCOUNTER — Telehealth: Payer: Self-pay | Admitting: Physician Assistant

## 2021-03-20 NOTE — Telephone Encounter (Signed)
Patient called because she stated she had a missed call.I did not see anything in the chart and [facility] said she did not call. I let patient know to check for voicemail and callback if there was anything needed after listening or disregard call if no voicemail. Patient verbalized understanding.

## 2021-03-20 NOTE — Telephone Encounter (Signed)
Left message on voicemail returning her call. I did not call you but if you need something or not please call me back.

## 2021-03-20 NOTE — Telephone Encounter (Signed)
Mrs. Tuma called in returning Butch Penny phone call , she stated that she received the call it was for her husband that has an appointment on 12/20

## 2021-03-23 ENCOUNTER — Telehealth: Payer: Self-pay | Admitting: Physician Assistant

## 2021-03-23 NOTE — Telephone Encounter (Signed)
Medication:  levothyroxine (SYNTHROID) 50 MCG tablet [[id number]] tamsulosin (FLOMAX) 0.4 MG CAPS capsule [[id number]  Has the patient contacted their pharmacy? No. (If no, request that the patient contact the pharmacy for the refill.) (If yes, when and what did the pharmacy advise?)     Preferred Pharmacy (with phone number or street name):  [facility], [address]  [place], [place] [address]  Phone:  [phone]  Fax:  [phone]     Agent: Please be advised that RX refills may take up to 3 business days. We ask that you follow-up with your pharmacy.

## 2021-03-24 MED ORDER — TAMSULOSIN HCL 0.4 MG PO CAPS: 0.4000 mg | ORAL_CAPSULE | Freq: Every day | ORAL | 1 refills | Status: DC

## 2021-03-24 NOTE — Telephone Encounter (Signed)
Spoke to pt told her Rx for Levothyroxine was sent in Nov and you do need to schedule a follow up. The Tamsulosin have you been taking it and do you see [facility].Pt said yes her former PCP prescribed it for her and she is not seeing urology. Told her okay will send Rx to pharmacy and please schedule f/u for your thyroid. Pt verbalized understanding.

## 2021-04-07 ENCOUNTER — Other Ambulatory Visit: Payer: Self-pay

## 2021-04-07 ENCOUNTER — Telehealth: Payer: Self-pay | Admitting: Physician Assistant

## 2021-04-07 MED ORDER — OMEPRAZOLE 20 MG PO CPDR: 20.0000 mg | DELAYED_RELEASE_CAPSULE | Freq: Every day | ORAL | 3 refills | Status: DC

## 2021-04-07 MED ORDER — OXYBUTYNIN CHLORIDE ER 10 MG PO TB24: 10.0000 mg | ORAL_TABLET | Freq: Every day | ORAL | 0 refills | Status: DC

## 2021-04-07 MED ORDER — FUROSEMIDE 40 MG PO TABS: 40.0000 mg | ORAL_TABLET | Freq: Every day | ORAL | 3 refills | Status: DC

## 2021-04-07 MED ORDER — SERTRALINE HCL 100 MG PO TABS: 100.0000 mg | ORAL_TABLET | Freq: Every day | ORAL | 0 refills | Status: DC

## 2021-04-07 MED ORDER — LAMOTRIGINE 200 MG PO TABS: 200.0000 mg | ORAL_TABLET | Freq: Every day | ORAL | 3 refills | Status: DC

## 2021-04-07 MED ORDER — PRAVASTATIN SODIUM 20 MG PO TABS: 20.0000 mg | ORAL_TABLET | Freq: Every day | ORAL | 0 refills | Status: DC

## 2021-04-07 NOTE — Telephone Encounter (Signed)
Pt called and said that some medication that shes been on that is in old providers name still and she just needs refills. lamoTRIgine (LAMICTAL) 200 MG tablet oxybutynin (DITROPAN-XL) ER10 MG 24 hr tablet omeprazole (PRILOSEC) ER 20 MG capsule potassium citrate (UROCIT-K) 10 MEQ (1080 MG) SR tablet sertraline (ZOLOFT) 100 MG tablet pravastatin (PRAVACHOL) 20 MG tablet. furosemide (LASIX) 40 MG tablet  (Pt is completely out of this one)  Pt needs called into [facility] [phone]  Pts call back number if needed [phone]

## 2021-04-07 NOTE — Telephone Encounter (Signed)
Rx sent in

## 2021-04-10 DIAGNOSIS — M47816 Spondylosis without myelopathy or radiculopathy, lumbar region: Secondary | ICD-10-CM | POA: Diagnosis not present

## 2021-04-10 DIAGNOSIS — M9907 Segmental and somatic dysfunction of upper extremity: Secondary | ICD-10-CM | POA: Diagnosis not present

## 2021-04-10 DIAGNOSIS — R262 Difficulty in walking, not elsewhere classified: Secondary | ICD-10-CM | POA: Diagnosis not present

## 2021-04-10 DIAGNOSIS — M19011 Primary osteoarthritis, right shoulder: Secondary | ICD-10-CM | POA: Diagnosis not present

## 2021-04-15 DIAGNOSIS — M19011 Primary osteoarthritis, right shoulder: Secondary | ICD-10-CM | POA: Diagnosis not present

## 2021-04-15 MED ORDER — POTASSIUM CITRATE ER 10 MEQ (1080 MG) PO TBCR: 10.0000 meq | EXTENDED_RELEASE_TABLET | Freq: Every day | ORAL | 1 refills | Status: DC

## 2021-04-15 NOTE — Telephone Encounter (Signed)
Pt called back and said that the potassium citrate (UROCIT-K) 10 MEQ (1080 MG) SR tablet was not sent in and asked if it could be sent in as well.

## 2021-04-15 NOTE — Telephone Encounter (Signed)
Spoke to pt clarified directions for Potassium. Told her will send Rx now to [facility] mail order. Pt verbalized understanding. Rx sent.

## 2021-04-16 DIAGNOSIS — M19011 Primary osteoarthritis, right shoulder: Secondary | ICD-10-CM | POA: Diagnosis not present

## 2021-04-22 DIAGNOSIS — M19011 Primary osteoarthritis, right shoulder: Secondary | ICD-10-CM | POA: Diagnosis not present

## 2021-04-24 DIAGNOSIS — M19011 Primary osteoarthritis, right shoulder: Secondary | ICD-10-CM | POA: Diagnosis not present

## 2021-04-29 DIAGNOSIS — M19011 Primary osteoarthritis, right shoulder: Secondary | ICD-10-CM | POA: Diagnosis not present

## 2021-05-01 DIAGNOSIS — M19011 Primary osteoarthritis, right shoulder: Secondary | ICD-10-CM | POA: Diagnosis not present

## 2021-05-13 DIAGNOSIS — M19011 Primary osteoarthritis, right shoulder: Secondary | ICD-10-CM | POA: Diagnosis not present

## 2021-05-15 ENCOUNTER — Other Ambulatory Visit: Payer: Self-pay | Admitting: Family Medicine

## 2021-05-18 DIAGNOSIS — M19011 Primary osteoarthritis, right shoulder: Secondary | ICD-10-CM | POA: Diagnosis not present

## 2021-05-19 DIAGNOSIS — M545 Low back pain, unspecified: Secondary | ICD-10-CM | POA: Diagnosis not present

## 2021-05-19 DIAGNOSIS — M4696 Unspecified inflammatory spondylopathy, lumbar region: Secondary | ICD-10-CM | POA: Diagnosis not present

## 2021-05-20 DIAGNOSIS — M19011 Primary osteoarthritis, right shoulder: Secondary | ICD-10-CM | POA: Diagnosis not present

## 2021-05-25 DIAGNOSIS — M19011 Primary osteoarthritis, right shoulder: Secondary | ICD-10-CM | POA: Diagnosis not present

## 2021-05-27 ENCOUNTER — Other Ambulatory Visit: Payer: Self-pay | Admitting: Physician Assistant

## 2021-05-28 DIAGNOSIS — M064 Inflammatory polyarthropathy: Secondary | ICD-10-CM | POA: Diagnosis not present

## 2021-05-28 DIAGNOSIS — M1990 Unspecified osteoarthritis, unspecified site: Secondary | ICD-10-CM | POA: Diagnosis not present

## 2021-05-28 DIAGNOSIS — G629 Polyneuropathy, unspecified: Secondary | ICD-10-CM | POA: Diagnosis not present

## 2021-05-28 DIAGNOSIS — M797 Fibromyalgia: Secondary | ICD-10-CM | POA: Diagnosis not present

## 2021-05-28 DIAGNOSIS — R768 Other specified abnormal immunological findings in serum: Secondary | ICD-10-CM | POA: Diagnosis not present

## 2021-05-28 DIAGNOSIS — Z79899 Other long term (current) drug therapy: Secondary | ICD-10-CM | POA: Diagnosis not present

## 2021-05-28 DIAGNOSIS — M109 Gout, unspecified: Secondary | ICD-10-CM | POA: Diagnosis not present

## 2021-05-28 DIAGNOSIS — E119 Type 2 diabetes mellitus without complications: Secondary | ICD-10-CM | POA: Diagnosis not present

## 2021-05-29 LAB — CBC AND DIFFERENTIAL
HCT: 36 (ref 36–46)
Hemoglobin: 11.6 — AB (ref 12.0–16.0)
Neutrophils Absolute: 7.3
Platelets: 487 — AB (ref 150–399)
WBC: 10.5

## 2021-05-29 LAB — HEPATIC FUNCTION PANEL
ALT: 15 (ref 7–35)
AST: 18 (ref 13–35)
Alkaline Phosphatase: 122 (ref 25–125)
Bilirubin, Total: 0.2

## 2021-05-29 LAB — BASIC METABOLIC PANEL
BUN: 7 (ref 4–21)
CO2: 32 — AB (ref 13–22)
Chloride: 92 — AB (ref 99–108)
Creatinine: 0.6 (ref 0.5–1.1)
Glucose: 146
Potassium: 4.6 (ref 3.4–5.3)
Sodium: 137 (ref 137–147)

## 2021-05-29 LAB — POCT ERYTHROCYTE SEDIMENTATION RATE, NON-AUTOMATED: Sed Rate: 46

## 2021-05-29 LAB — COMPREHENSIVE METABOLIC PANEL
Albumin: 4.6 (ref 3.5–5.0)
Calcium: 10 (ref 8.7–10.7)
eGFR: 90

## 2021-05-29 LAB — CBC: RBC: 4.29 (ref 3.87–5.11)

## 2021-06-02 ENCOUNTER — Encounter: Payer: Self-pay | Admitting: Physician Assistant

## 2021-06-02 DIAGNOSIS — M47816 Spondylosis without myelopathy or radiculopathy, lumbar region: Secondary | ICD-10-CM | POA: Diagnosis not present

## 2021-06-03 ENCOUNTER — Other Ambulatory Visit: Payer: Self-pay | Admitting: Physician Assistant

## 2021-06-03 ENCOUNTER — Other Ambulatory Visit: Payer: Self-pay | Admitting: Adult Health

## 2021-06-03 ENCOUNTER — Other Ambulatory Visit: Payer: Self-pay | Admitting: *Deleted

## 2021-06-03 ENCOUNTER — Ambulatory Visit
Admission: RE | Admit: 2021-06-03 | Discharge: 2021-06-03 | Disposition: A | Discharge status: Home / Self Care | Payer: Medicare PPO | Source: Ambulatory Visit | Attending: Adult Health | Admitting: Adult Health

## 2021-06-03 DIAGNOSIS — D3132 Benign neoplasm of left choroid: Secondary | ICD-10-CM | POA: Diagnosis not present

## 2021-06-03 DIAGNOSIS — Z853 Personal history of malignant neoplasm of breast: Secondary | ICD-10-CM

## 2021-06-03 DIAGNOSIS — Z1231 Encounter for screening mammogram for malignant neoplasm of breast: Secondary | ICD-10-CM

## 2021-06-03 DIAGNOSIS — E119 Type 2 diabetes mellitus without complications: Secondary | ICD-10-CM | POA: Diagnosis not present

## 2021-06-03 DIAGNOSIS — Z961 Presence of intraocular lens: Secondary | ICD-10-CM | POA: Diagnosis not present

## 2021-06-03 DIAGNOSIS — Z794 Long term (current) use of insulin: Secondary | ICD-10-CM | POA: Diagnosis not present

## 2021-06-03 DIAGNOSIS — H04123 Dry eye syndrome of bilateral lacrimal glands: Secondary | ICD-10-CM | POA: Diagnosis not present

## 2021-06-03 DIAGNOSIS — H43812 Vitreous degeneration, left eye: Secondary | ICD-10-CM | POA: Diagnosis not present

## 2021-06-03 HISTORY — DX: Personal history of irradiation: Z92.3

## 2021-06-03 LAB — HM DIABETES EYE EXAM

## 2021-06-03 MED ORDER — TRUE METRIX BLOOD GLUCOSE TEST VI STRP: ORAL_STRIP | 4 refills | Status: DC

## 2021-06-04 ENCOUNTER — Encounter: Payer: Self-pay | Admitting: Physician Assistant

## 2021-06-04 DIAGNOSIS — M19011 Primary osteoarthritis, right shoulder: Secondary | ICD-10-CM | POA: Diagnosis not present

## 2021-06-05 DIAGNOSIS — M25511 Pain in right shoulder: Secondary | ICD-10-CM | POA: Diagnosis not present

## 2021-06-10 DIAGNOSIS — M19011 Primary osteoarthritis, right shoulder: Secondary | ICD-10-CM | POA: Diagnosis not present

## 2021-06-15 DIAGNOSIS — M19011 Primary osteoarthritis, right shoulder: Secondary | ICD-10-CM | POA: Diagnosis not present

## 2021-06-16 DIAGNOSIS — G629 Polyneuropathy, unspecified: Secondary | ICD-10-CM | POA: Diagnosis not present

## 2021-06-16 DIAGNOSIS — M109 Gout, unspecified: Secondary | ICD-10-CM | POA: Diagnosis not present

## 2021-06-16 DIAGNOSIS — Z79899 Other long term (current) drug therapy: Secondary | ICD-10-CM | POA: Diagnosis not present

## 2021-06-16 DIAGNOSIS — R768 Other specified abnormal immunological findings in serum: Secondary | ICD-10-CM | POA: Diagnosis not present

## 2021-06-16 DIAGNOSIS — E119 Type 2 diabetes mellitus without complications: Secondary | ICD-10-CM | POA: Diagnosis not present

## 2021-06-16 DIAGNOSIS — M797 Fibromyalgia: Secondary | ICD-10-CM | POA: Diagnosis not present

## 2021-06-16 DIAGNOSIS — M064 Inflammatory polyarthropathy: Secondary | ICD-10-CM | POA: Diagnosis not present

## 2021-06-16 DIAGNOSIS — M1990 Unspecified osteoarthritis, unspecified site: Secondary | ICD-10-CM | POA: Diagnosis not present

## 2021-06-16 DIAGNOSIS — M79644 Pain in right finger(s): Secondary | ICD-10-CM | POA: Diagnosis not present

## 2021-06-16 LAB — BASIC METABOLIC PANEL
BUN: 7 (ref 4–21)
CO2: 32 — AB (ref 13–22)
Chloride: 92 — AB (ref 99–108)
Creatinine: 0.6 (ref 0.5–1.1)
Glucose: 146
Potassium: 4.6 mEq/L (ref 3.5–5.1)
Sodium: 137 (ref 137–147)

## 2021-06-16 LAB — HEPATIC FUNCTION PANEL
ALT: 15 U/L (ref 7–35)
AST: 18 (ref 13–35)
Alkaline Phosphatase: 122 (ref 25–125)
Bilirubin, Total: 0.2

## 2021-06-16 LAB — HM DEXA SCAN: HM Dexa Scan: NORMAL

## 2021-06-16 LAB — CBC AND DIFFERENTIAL
HCT: 36 (ref 36–46)
Hemoglobin: 11.6 — AB (ref 12.0–16.0)
Neutrophils Absolute: 7.3
Platelets: 487 10*3/uL — AB (ref 150–400)
WBC: 10.5

## 2021-06-16 LAB — COMPREHENSIVE METABOLIC PANEL
Albumin: 4.6 (ref 3.5–5.0)
Calcium: 10 (ref 8.7–10.7)
Globulin: 3.5
eGFR: 90

## 2021-06-16 LAB — POCT ERYTHROCYTE SEDIMENTATION RATE, NON-AUTOMATED: Sed Rate: 46

## 2021-06-16 LAB — CBC: RBC: 4.29 (ref 3.87–5.11)

## 2021-06-16 LAB — NOVEL CORONAVIRUS, NAA: SARS-CoV-2, NAA: NEGATIVE

## 2021-06-17 DIAGNOSIS — M47816 Spondylosis without myelopathy or radiculopathy, lumbar region: Secondary | ICD-10-CM | POA: Diagnosis not present

## 2021-06-18 ENCOUNTER — Other Ambulatory Visit: Payer: Self-pay | Admitting: Physician Assistant

## 2021-06-19 ENCOUNTER — Telehealth: Payer: Self-pay | Admitting: Physician Assistant

## 2021-06-19 DIAGNOSIS — M19011 Primary osteoarthritis, right shoulder: Secondary | ICD-10-CM | POA: Diagnosis not present

## 2021-06-19 NOTE — Telephone Encounter (Signed)
Attempted to schedule AWV. Unable to LVM.  Will try at later time.  

## 2021-06-24 ENCOUNTER — Encounter: Payer: Self-pay | Admitting: Physician Assistant

## 2021-06-25 DIAGNOSIS — M19011 Primary osteoarthritis, right shoulder: Secondary | ICD-10-CM | POA: Diagnosis not present

## 2021-06-27 ENCOUNTER — Other Ambulatory Visit: Payer: Self-pay | Admitting: Physician Assistant

## 2021-06-27 ENCOUNTER — Other Ambulatory Visit: Payer: Self-pay | Admitting: Family Medicine

## 2021-06-27 DIAGNOSIS — J019 Acute sinusitis, unspecified: Secondary | ICD-10-CM

## 2021-06-27 DIAGNOSIS — H9203 Otalgia, bilateral: Secondary | ICD-10-CM

## 2021-07-08 DIAGNOSIS — M25511 Pain in right shoulder: Secondary | ICD-10-CM | POA: Diagnosis not present

## 2021-07-08 DIAGNOSIS — M9901 Segmental and somatic dysfunction of cervical region: Secondary | ICD-10-CM | POA: Diagnosis not present

## 2021-07-08 DIAGNOSIS — M25531 Pain in right wrist: Secondary | ICD-10-CM | POA: Diagnosis not present

## 2021-07-08 DIAGNOSIS — M9902 Segmental and somatic dysfunction of thoracic region: Secondary | ICD-10-CM | POA: Diagnosis not present

## 2021-07-08 DIAGNOSIS — S0011XA Contusion of right eyelid and periocular area, initial encounter: Secondary | ICD-10-CM | POA: Diagnosis not present

## 2021-07-08 DIAGNOSIS — W19XXXA Unspecified fall, initial encounter: Secondary | ICD-10-CM | POA: Diagnosis not present

## 2021-07-08 DIAGNOSIS — M9907 Segmental and somatic dysfunction of upper extremity: Secondary | ICD-10-CM | POA: Diagnosis not present

## 2021-07-14 DIAGNOSIS — M47816 Spondylosis without myelopathy or radiculopathy, lumbar region: Secondary | ICD-10-CM | POA: Diagnosis not present

## 2021-07-27 ENCOUNTER — Other Ambulatory Visit: Payer: Self-pay | Admitting: Physician Assistant

## 2021-08-10 ENCOUNTER — Ambulatory Visit: Payer: Medicare PPO | Admitting: Physician Assistant

## 2021-08-12 DIAGNOSIS — M47816 Spondylosis without myelopathy or radiculopathy, lumbar region: Secondary | ICD-10-CM | POA: Diagnosis not present

## 2021-08-16 ENCOUNTER — Other Ambulatory Visit: Payer: Self-pay | Admitting: Physician Assistant

## 2021-08-21 ENCOUNTER — Ambulatory Visit: Payer: Medicare PPO | Admitting: Physician Assistant

## 2021-08-24 ENCOUNTER — Encounter: Payer: Self-pay | Admitting: Physician Assistant

## 2021-08-24 ENCOUNTER — Ambulatory Visit (INDEPENDENT_AMBULATORY_CARE_PROVIDER_SITE_OTHER): Payer: Medicare PPO | Admitting: Physician Assistant

## 2021-08-24 VITALS — BP 140/80 | HR 71 | Temp 97.9°F | Ht 61.5 in | Wt 232.5 lb

## 2021-08-24 DIAGNOSIS — L659 Nonscarring hair loss, unspecified: Secondary | ICD-10-CM | POA: Diagnosis not present

## 2021-08-24 DIAGNOSIS — I10 Essential (primary) hypertension: Secondary | ICD-10-CM | POA: Diagnosis not present

## 2021-08-24 DIAGNOSIS — M064 Inflammatory polyarthropathy: Secondary | ICD-10-CM

## 2021-08-24 DIAGNOSIS — E1165 Type 2 diabetes mellitus with hyperglycemia: Secondary | ICD-10-CM

## 2021-08-24 DIAGNOSIS — E1140 Type 2 diabetes mellitus with diabetic neuropathy, unspecified: Secondary | ICD-10-CM

## 2021-08-24 DIAGNOSIS — E890 Postprocedural hypothyroidism: Secondary | ICD-10-CM

## 2021-08-24 DIAGNOSIS — E1159 Type 2 diabetes mellitus with other circulatory complications: Secondary | ICD-10-CM | POA: Diagnosis not present

## 2021-08-24 MED ORDER — SULFASALAZINE 500 MG PO TABS: 1000.0000 mg | ORAL_TABLET | Freq: Two times a day (BID) | ORAL | Status: DC

## 2021-08-24 NOTE — Patient Instructions (Signed)
It was great to see you!  Follow-up with Paulla Fore regarding your need for orthotics and neuropathy  Update blood work after 3 days of holding biotin -- please schedule a lab only appointment for this  I will be in touch after we get all of your blood work results with recommendations  Take care,  Inda Coke PA-C

## 2021-08-24 NOTE — Progress Notes (Signed)
Brittany Acosta is a 71 y.o. female here for a follow up of a pre-existing problem.  History of Present Illness:   Chief Complaint  Patient presents with   Hypertension   Diabetes    HPI  HTN Currently taking  Lisinopril 5 mg daily and lasix 40 mg daily. She is tolerating her medication well. At home blood pressure readings are: has not been checking regularly. Patient denies chest pain, SOB, blurred vision, dizziness, unusual headaches, lower leg swelling. Patient is compliant with medication. She has noticed swelling in her right legs for the past few weeks. Denies any injury or trauma. Denies excessive caffeine intake, stimulant usage, excessive alcohol intake, or increase in salt consumption. BP Readings from Last 3 Encounters:  08/24/21 140/80  02/12/21 (!) 144/62  02/09/21 120/64    Diabetes Patient is currently taking Metformin 500 mg twice daily in AM and 500 mg daily in PM; she is also on Tresiba 26 units daily. She is tolerating her medication well without any side effects. Blood sugars at home are usually WNL. Patient is compliant with her medications with no complications. She has been having increased neuropathy. She recently fall when going upstairs. She notes pain is worse in right leg. Denies: hypoglycemic or hyperglycemic episodes or symptoms.  Lab Results  Component Value Date   HGBA1C 6.7 (H) 01/29/2021    Hair loss  She has been experiencing hair loss for the past few months. She notes she was taking estrogen in the past but this was stopped due to having hx of breast cancer. She is currently taking Biotin and symptoms seems to be not manageable.   Hypothyroidism  Patient is currently taking Synthroid 50 mcg daily.She is tolerating her medication without any side effects. She is compliant with her medication. She notes she was trouble breathing and was found to have multinodular goiter in 2013. She underwent thyroid lobectomy in 2013 for this issue and she was started  on Synthroid at that time. Denies an worsening symptoms.   Peripheral polyneuropathy She has been experiencing muscle weakness mostly in right leg. She notes she fell about few weeks ago due to this. She is currently taking Gabapentin 300 mg 3 times daily. She is also taking Flexeril 5 mg. She is tolerating her medication without any side effects but does not feel like she is getting much relief.  Past Medical History:  Diagnosis Date   Allergic rhinitis, cause unspecified    Arthritis    Bipolar disorder, unspecified ([place])    depression   Cancer ([place])    skin cancer on left leg, left breast cancer   Chronic diastolic heart failure (HCC)    Depression    GERD (gastroesophageal reflux disease)    Headache    History of gout    "haven't had it in several years" (02/08/2012)   History of kidney stones    History of wrist fracture    rt wrist   Hypertension    Iron deficiency anemia    Kidney stones    sees urologist @ [facility]   Morbid obesity ([place])    Neuropathy due to secondary diabetes ([place])    Nontoxic multinodular goiter    Osteoporosis    Personal history of radiation therapy    Pneumonia    PONV (postoperative nausea and vomiting)    PTSD (post-traumatic stress disorder)    Pure hypercholesterolemia    RBBB    Scleroderma ([place])    Sleep apnea 10/2012   mild  osa-did not need cpap -dr clance   Type II diabetes mellitus ([place])    Unspecified hypothyroidism      Social History   Tobacco Use   Smoking status: Former    Packs/day: 1.00    Years: 6.00    Pack years: 6.00    Types: Cigarettes    Quit date: 04/06/1983    Years since quitting: 38.4   Smokeless tobacco: Never   Tobacco comments:    Married, lives with spouse. Disable- 2 grown kids-6 g-kids  Vaping Use   Vaping Use: Never used  Substance Use Topics   Alcohol use: No    Comment: none   Drug use: No    Past Surgical History:  Procedure Laterality Date   ABDOMINAL HYSTERECTOMY  1976   BREAST EXCISIONAL  BIOPSY Left    BREAST LUMPECTOMY Left 04/25/2018   BREAST LUMPECTOMY WITH RADIOACTIVE SEED AND SENTINEL LYMPH NODE BIOPSY Left 04/25/2018   Procedure: LEFT BREAST LUMPECTOMY WITH RADIOACTIVE SEED AND SENTINEL LYMPH NODE BIOPSY;  Surgeon: Excell Seltzer, MD;  Location: [facility];  Service: General;  Laterality: Left;   [place]  2001   sees Dr Peter Martinique   CATARACT EXTRACTION  2014   CATARACT EXTRACTION, BILATERAL  02/2011   epps   CHOLECYSTECTOMY  1985   EXCISIONAL HEMORRHOIDECTOMY     "dr cut out in his office" (02/08/2012)   [place]   right   JOINT REPLACEMENT     rt knee   KNEE ARTHROSCOPY  08/2009   right   KNEE ARTHROSCOPY WITH MEDIAL MENISECTOMY     left   Left Cystoscopy   [place]   Lithotripsy (L) Kidney  1997   LUMBAR LAMINECTOMY/DECOMPRESSION MICRODISCECTOMY Left 07/01/2016   Procedure: Laminectomy for synovial cyst - left - Lumbar four-five;  Surgeon: Earnie Larsson, MD;  Location: [facility];  Service: Neurosurgery;  Laterality: Left;   PARTIAL MASTECTOMY WITH NEEDLE LOCALIZATION Left 09/01/2012   Procedure: PARTIAL MASTECTOMY WITH NEEDLE LOCALIZATION;  Surgeon: Adin Hector, MD;  Location: [facility];  Service: General;  Laterality: Left;   [place] stone removed (L) Kidney  1992   REVERSE SHOULDER ARTHROPLASTY Right 02/12/2021   Procedure: REVERSE SHOULDER ARTHROPLASTY;  Surgeon: Tania Ade, MD;  Location: [facility];  Service: Orthopedics;  Laterality: Right;   Right nasal surgery  08/1988   Right sinus removed  08/1989   tooth partial   ROTATOR CUFF REPAIR  2013   right shoulder x 2   SHOULDER ARTHROSCOPY WITH ROTATOR CUFF REPAIR AND SUBACROMIAL DECOMPRESSION Left 08/20/2013   Procedure: SHOULDER ARTHROSCOPY  AND SUBACROMIAL DECOMPRESSION;  Surgeon: Nita Sells, MD;  Location: [facility];  Service: Orthopedics;  Laterality: Left;  Left shoulder arthroscopy,  debridement, subacromial decompression, distal clavical resection   THYROIDECTOMY  04/22/2011   Procedure: THYROIDECTOMY;  Surgeon: Onnie Graham, MD;  Location: [facility];  Service: ENT;  Laterality: N/A;  TOTAL THYROIDECOTMY   TOTAL KNEE ARTHROPLASTY  06/18/2011   Procedure: TOTAL KNEE ARTHROPLASTY;  Surgeon: Kerin Salen, MD;  Location: [facility];  Service: Orthopedics;  Laterality: Left;  DEPUY   TOTAL KNEE ARTHROPLASTY  02/07/2012   Procedure: TOTAL KNEE ARTHROPLASTY;  Surgeon: Kerin Salen, MD;  Location: [facility];  Service: Orthopedics;  Laterality: Right;   TUBAL LIGATION  1972    Family History  Problem Relation Age of Onset   Hypertension Mother  Lung cancer Mother        non-smoker   Diabetes Father    Hypertension Father    Hyperlipidemia Father    Heart attack Other    Coronary artery disease Other    Hypertension Sister    Hypertension Brother    Hypertension Brother    Hypertension Brother    Hypertension Sister    Hypertension Sister    Diabetes Daughter     No Known Allergies  Current Medications:   Current Outpatient Medications:    allopurinol (ZYLOPRIM) 300 MG tablet, TAKE 1 TABLET EVERY DAY, Disp: 90 tablet, Rfl: 0   anastrozole (ARIMIDEX) 1 MG tablet, TAKE 1 TABLET EVERY DAY, Disp: 90 tablet, Rfl: 0   aspirin EC 81 MG tablet, Take 1 tablet (81 mg total) by mouth daily. Swallow whole., Disp: 90 tablet, Rfl: 3   Biotin 10000 MCG TABS, Take 1 tablet by mouth daily in the afternoon., Disp: , Rfl:    cetirizine (ZYRTEC) 10 MG tablet, Take 20 mg by mouth daily. , Disp: , Rfl:    Cholecalciferol (VITAMIN D3) 125 MCG (5000 UT) CAPS, Take 1 capsule by mouth daily in the afternoon., Disp: , Rfl:    cyclobenzaprine (FLEXERIL) 5 MG tablet, Take 1 tablet (5 mg total) by mouth at bedtime as needed for muscle spasms., Disp: 90 tablet, Rfl: 0   doxepin (SINEQUAN) 10 MG capsule, TAKE 1 CAPSULE EVERY DAY, Disp: 90 capsule, Rfl: 0   ELDERBERRY PO, Take 5,000 capsules by mouth  daily in the afternoon., Disp: , Rfl:    fluticasone (FLONASE) 50 MCG/ACT nasal spray, USE 2 SPRAYS IN EACH NOSTRIL EVERY DAY, Disp: 48 g, Rfl: 1   folic acid (FOLVITE) 1 MG tablet, Take 2 mg by mouth daily., Disp: , Rfl:    furosemide (LASIX) 40 MG tablet, Take 1 tablet (40 mg total) by mouth daily., Disp: 90 tablet, Rfl: 3   gabapentin (NEURONTIN) 300 MG capsule, Take 300 mg by mouth 3 (three) times daily., Disp: , Rfl:    Ginkgo Biloba (GINKOBA PO), Take 3,000 capsules by mouth daily in the afternoon., Disp: , Rfl:    glucose blood (TRUE METRIX BLOOD GLUCOSE TEST) test strip, USE TO CHECK BLOOD SUGAR DAILY AND PRN, Disp: 100 each, Rfl: 4   Insulin Pen Needle (DROPLET PEN NEEDLES) 31G X 5 MM MISC, USE TO INJECT INSULIN DAILY, Disp: 100 each, Rfl: 3   lamoTRIgine (LAMICTAL) 200 MG tablet, Take 1 tablet (200 mg total) by mouth daily at 12 noon., Disp: 90 tablet, Rfl: 3   levothyroxine (SYNTHROID) 50 MCG tablet, TAKE 1 TABLET EVERY DAY (OFFICE VISIT NEEDED FOR FURTHER REFILLS), Disp: 90 tablet, Rfl: 1   lisinopril (ZESTRIL) 5 MG tablet, TAKE 1 TABLET EVERY DAY, Disp: 90 tablet, Rfl: 1   metFORMIN (GLUCOPHAGE) 500 MG tablet, TAKE 2 TABLETS ('1000MG'$ ) EVERY MORNING AND TAKE 1 TABLET ('500MG'$ ) EVERY EVENING, Disp: 270 tablet, Rfl: 1   methotrexate 2.5 MG tablet, Take 15 mg by mouth every Saturday., Disp: , Rfl:    omeprazole (PRILOSEC) 20 MG capsule, Take 1 capsule (20 mg total) by mouth daily. Take 30 minutes before a meal, Disp: 90 capsule, Rfl: 3   oxybutynin (DITROPAN-XL) 10 MG 24 hr tablet, TAKE 1 TABLET (10 MG TOTAL) BY MOUTH AT BEDTIME., Disp: 90 tablet, Rfl: 0   potassium citrate (UROCIT-K) 10 MEQ (1080 MG) SR tablet, Take 1 tablet (10 mEq total) by mouth daily in the afternoon., Disp: 90 tablet, Rfl: 1  pravastatin (PRAVACHOL) 20 MG tablet, TAKE 1 TABLET (20 MG TOTAL) BY MOUTH AT BEDTIME., Disp: 90 tablet, Rfl: 0   predniSONE (DELTASONE) 5 MG tablet, Take 5 mg by mouth daily with breakfast. ,  Disp: , Rfl:    sertraline (ZOLOFT) 100 MG tablet, TAKE 1 TABLET (100 MG TOTAL) BY MOUTH DAILY., Disp: 90 tablet, Rfl: 0   tamsulosin (FLOMAX) 0.4 MG CAPS capsule, TAKE 1 CAPSULE EVERY DAY, Disp: 90 capsule, Rfl: 1   TRESIBA FLEXTOUCH 100 UNIT/ML FlexTouch Pen, INJECT 26 UNITS SUBCUTANEOUSLY DAILY, Disp: 30 mL, Rfl: 1   sulfaSALAzine (AZULFIDINE) 500 MG tablet, Take 2 tablets (1,000 mg total) by mouth 2 (two) times daily., Disp: , Rfl:    Review of Systems:   ROS Negative unless otherwise specified per HPI.  Vitals:   Vitals:   08/24/21 1452  BP: 140/80  Pulse: 71  Temp: 97.9 F (36.6 C)  TempSrc: Temporal  SpO2: 95%  Weight: 232 lb 8 oz (105.5 kg)  Height: 5' 1.5" (1.562 m)     Body mass index is 43.22 kg/m.  Physical Exam:   Physical Exam Vitals and nursing note reviewed.  Constitutional:      General: She is not in acute distress.    Appearance: She is well-developed. She is not ill-appearing or toxic-appearing.  Cardiovascular:     Rate and Rhythm: Normal rate and regular rhythm.     Pulses: Normal pulses.     Heart sounds: Normal heart sounds, S1 normal and S2 normal.  Pulmonary:     Effort: Pulmonary effort is normal.     Breath sounds: Normal breath sounds.  Musculoskeletal:     Comments: Trace edema to R lateral ankle; no erythema or TTP  Skin:    General: Skin is warm and dry.  Neurological:     Mental Status: She is alert.     GCS: GCS eye subscore is 4. GCS verbal subscore is 5. GCS motor subscore is 6.  Psychiatric:        Speech: Speech normal.        Behavior: Behavior normal. Behavior is cooperative.    Assessment and Plan:   Hair loss Uncontrolled Will have her hold her biotin and then we will update blood work in 3 days or so Update TSH and ferritin specifically Discussed that I would not start estrogen for thinning hair due to hx of breast cancer  Hypothyroidism, postsurgical Compliant Update thyroid levels and adjust levothyroxine 50  mcg as indicated  Poorly controlled type 2 diabetes mellitus with circulatory disorder (HCC) Update A1c and adjust metformin 1000 mg in AM and 500 mg in PM and Tresiba as indicated  Inflammatory polyarthritis ([place]) Management per rheum  Diabetic neuropathy, painful ([place]) Rolling walker rx provided Recommended follow-up with Dr. Paulla Fore to assess ankle and updating orthotics Limited options for medications at this point -- doesn't seem to respond to gabapentin or lyrica; currently on low dose prednisone per rheum  Hypertension associated with diabetes ([place]) Overall well controlled Continue to monitor at home Follow-up as needed  I,Savera Zaman,acting as a scribe for [employer], PA.,have documented all relevant documentation on the behalf of Inda Coke, PA,as directed by  Inda Coke, PA while in the presence of Inda Coke, [place].   I, Inda Coke, [place], have reviewed all documentation for this visit. The documentation on 08/24/21 for the exam, diagnosis, procedures, and orders are all accurate and complete.   Inda Coke, PA-C

## 2021-08-25 DIAGNOSIS — G629 Polyneuropathy, unspecified: Secondary | ICD-10-CM | POA: Diagnosis not present

## 2021-08-25 DIAGNOSIS — M069 Rheumatoid arthritis, unspecified: Secondary | ICD-10-CM | POA: Diagnosis not present

## 2021-08-25 DIAGNOSIS — M109 Gout, unspecified: Secondary | ICD-10-CM | POA: Diagnosis not present

## 2021-08-25 DIAGNOSIS — Z79899 Other long term (current) drug therapy: Secondary | ICD-10-CM | POA: Diagnosis not present

## 2021-08-25 DIAGNOSIS — M797 Fibromyalgia: Secondary | ICD-10-CM | POA: Diagnosis not present

## 2021-08-25 DIAGNOSIS — M1990 Unspecified osteoarthritis, unspecified site: Secondary | ICD-10-CM | POA: Diagnosis not present

## 2021-08-25 DIAGNOSIS — M064 Inflammatory polyarthropathy: Secondary | ICD-10-CM | POA: Diagnosis not present

## 2021-08-25 DIAGNOSIS — R768 Other specified abnormal immunological findings in serum: Secondary | ICD-10-CM | POA: Diagnosis not present

## 2021-08-25 DIAGNOSIS — E119 Type 2 diabetes mellitus without complications: Secondary | ICD-10-CM | POA: Diagnosis not present

## 2021-08-27 ENCOUNTER — Other Ambulatory Visit: Payer: Medicare PPO

## 2021-08-28 ENCOUNTER — Other Ambulatory Visit (INDEPENDENT_AMBULATORY_CARE_PROVIDER_SITE_OTHER): Payer: Medicare PPO

## 2021-08-28 DIAGNOSIS — E890 Postprocedural hypothyroidism: Secondary | ICD-10-CM | POA: Diagnosis not present

## 2021-08-28 DIAGNOSIS — E1165 Type 2 diabetes mellitus with hyperglycemia: Secondary | ICD-10-CM

## 2021-08-28 DIAGNOSIS — E1159 Type 2 diabetes mellitus with other circulatory complications: Secondary | ICD-10-CM | POA: Diagnosis not present

## 2021-08-28 DIAGNOSIS — L659 Nonscarring hair loss, unspecified: Secondary | ICD-10-CM

## 2021-08-28 LAB — CBC WITH DIFFERENTIAL/PLATELET
Basophils Absolute: 0 10*3/uL (ref 0.0–0.1)
Basophils Relative: 0.5 % (ref 0.0–3.0)
Eosinophils Absolute: 0.2 10*3/uL (ref 0.0–0.7)
Eosinophils Relative: 2.5 % (ref 0.0–5.0)
HCT: 35.2 % — ABNORMAL LOW (ref 36.0–46.0)
Hemoglobin: 11.2 g/dL — ABNORMAL LOW (ref 12.0–15.0)
Lymphocytes Relative: 27.3 % (ref 12.0–46.0)
Lymphs Abs: 2.2 10*3/uL (ref 0.7–4.0)
MCHC: 31.7 g/dL (ref 30.0–36.0)
MCV: 85.6 fl (ref 78.0–100.0)
Monocytes Absolute: 0.6 10*3/uL (ref 0.1–1.0)
Monocytes Relative: 7.9 % (ref 3.0–12.0)
Neutro Abs: 5 10*3/uL (ref 1.4–7.7)
Neutrophils Relative %: 61.8 % (ref 43.0–77.0)
Platelets: 508 10*3/uL — ABNORMAL HIGH (ref 150.0–400.0)
RBC: 4.11 Mil/uL (ref 3.87–5.11)
RDW: 18.2 % — ABNORMAL HIGH (ref 11.5–15.5)
WBC: 8.1 10*3/uL (ref 4.0–10.5)

## 2021-08-28 LAB — LIPID PANEL
Cholesterol: 135 mg/dL (ref 0–200)
HDL: 60.6 mg/dL (ref >39.00)
LDL Cholesterol: 61 mg/dL (ref 0–99)
NonHDL: 74.42
Total CHOL/HDL Ratio: 2
Triglycerides: 67 mg/dL (ref 0.0–149.0)
VLDL: 13.4 mg/dL (ref 0.0–40.0)

## 2021-08-28 LAB — COMPREHENSIVE METABOLIC PANEL
ALT: 15 U/L (ref 0–35)
AST: 16 U/L (ref 0–37)
Albumin: 4 g/dL (ref 3.5–5.2)
Alkaline Phosphatase: 106 U/L (ref 39–117)
BUN: 10 mg/dL (ref 6–23)
CO2: 34 mEq/L — ABNORMAL HIGH (ref 19–32)
Calcium: 9.3 mg/dL (ref 8.4–10.5)
Chloride: 100 mEq/L (ref 96–112)
Creatinine, Ser: 0.63 mg/dL (ref 0.40–1.20)
GFR: 89.48 mL/min (ref >60.00)
Glucose, Bld: 134 mg/dL — ABNORMAL HIGH (ref 70–99)
Potassium: 4.2 mEq/L (ref 3.5–5.1)
Sodium: 142 mEq/L (ref 135–145)
Total Bilirubin: 0.4 mg/dL (ref 0.2–1.2)
Total Protein: 6.4 g/dL (ref 6.0–8.3)

## 2021-08-28 LAB — TSH: TSH: 2.67 u[IU]/mL (ref 0.35–5.50)

## 2021-08-28 LAB — IBC + FERRITIN
Ferritin: 18.4 ng/mL (ref 10.0–291.0)
Iron: 31 ug/dL — ABNORMAL LOW (ref 42–145)
Saturation Ratios: 9.4 % — ABNORMAL LOW (ref 20.0–50.0)
TIBC: 330.4 ug/dL (ref 250.0–450.0)
Transferrin: 236 mg/dL (ref 212.0–360.0)

## 2021-08-28 LAB — MICROALBUMIN / CREATININE URINE RATIO
Creatinine,U: 90.6 mg/dL
Microalb Creat Ratio: 1.5 mg/g (ref 0.0–30.0)
Microalb, Ur: 1.4 mg/dL (ref 0.0–1.9)

## 2021-09-01 ENCOUNTER — Other Ambulatory Visit (INDEPENDENT_AMBULATORY_CARE_PROVIDER_SITE_OTHER): Payer: Medicare PPO

## 2021-09-01 DIAGNOSIS — R2681 Unsteadiness on feet: Secondary | ICD-10-CM | POA: Diagnosis not present

## 2021-09-01 DIAGNOSIS — M47816 Spondylosis without myelopathy or radiculopathy, lumbar region: Secondary | ICD-10-CM | POA: Diagnosis not present

## 2021-09-01 DIAGNOSIS — R2689 Other abnormalities of gait and mobility: Secondary | ICD-10-CM | POA: Diagnosis not present

## 2021-09-01 DIAGNOSIS — M130 Polyarthritis, unspecified: Secondary | ICD-10-CM | POA: Diagnosis not present

## 2021-09-01 DIAGNOSIS — E1142 Type 2 diabetes mellitus with diabetic polyneuropathy: Secondary | ICD-10-CM | POA: Diagnosis not present

## 2021-09-01 DIAGNOSIS — E1165 Type 2 diabetes mellitus with hyperglycemia: Secondary | ICD-10-CM

## 2021-09-01 DIAGNOSIS — R296 Repeated falls: Secondary | ICD-10-CM | POA: Diagnosis not present

## 2021-09-01 DIAGNOSIS — R700 Elevated erythrocyte sedimentation rate: Secondary | ICD-10-CM | POA: Diagnosis not present

## 2021-09-01 DIAGNOSIS — D509 Iron deficiency anemia, unspecified: Secondary | ICD-10-CM | POA: Diagnosis not present

## 2021-09-01 DIAGNOSIS — E1159 Type 2 diabetes mellitus with other circulatory complications: Secondary | ICD-10-CM

## 2021-09-01 DIAGNOSIS — R531 Weakness: Secondary | ICD-10-CM | POA: Diagnosis not present

## 2021-09-01 LAB — HEMOGLOBIN A1C: Hgb A1c MFr Bld: 6.7 % — ABNORMAL HIGH (ref 4.6–6.5)

## 2021-09-03 ENCOUNTER — Other Ambulatory Visit: Payer: Self-pay | Admitting: *Deleted

## 2021-09-03 ENCOUNTER — Telehealth: Payer: Self-pay | Admitting: *Deleted

## 2021-09-03 DIAGNOSIS — R710 Precipitous drop in hematocrit: Secondary | ICD-10-CM

## 2021-09-03 DIAGNOSIS — E1140 Type 2 diabetes mellitus with diabetic neuropathy, unspecified: Secondary | ICD-10-CM

## 2021-09-03 NOTE — Telephone Encounter (Signed)
Spoke to pt asked her is she sees a Art therapist that she gets diabetic shoes from? Pt said she does not have a Podiatrist and she has not had diabetic shoes in years. Told pt okay will place a referral to see Podiatrist so they can evaluate you for shoes, someone will contact you to schedule an appt. Pt verbalized understanding. Order put in [place].

## 2021-09-03 NOTE — Telephone Encounter (Signed)
Please send in order for diabetic shoes for patient.   Brittany Acosta

## 2021-09-07 ENCOUNTER — Other Ambulatory Visit (INDEPENDENT_AMBULATORY_CARE_PROVIDER_SITE_OTHER): Payer: Medicare PPO

## 2021-09-07 DIAGNOSIS — R710 Precipitous drop in hematocrit: Secondary | ICD-10-CM

## 2021-09-07 LAB — CBC WITH DIFFERENTIAL/PLATELET
Basophils Absolute: 0 10*3/uL (ref 0.0–0.1)
Basophils Relative: 0.5 % (ref 0.0–3.0)
Eosinophils Absolute: 0.3 10*3/uL (ref 0.0–0.7)
Eosinophils Relative: 3.6 % (ref 0.0–5.0)
HCT: 33.7 % — ABNORMAL LOW (ref 36.0–46.0)
Hemoglobin: 11 g/dL — ABNORMAL LOW (ref 12.0–15.0)
Lymphocytes Relative: 28.9 % (ref 12.0–46.0)
Lymphs Abs: 2.2 10*3/uL (ref 0.7–4.0)
MCHC: 32.7 g/dL (ref 30.0–36.0)
MCV: 84.9 fl (ref 78.0–100.0)
Monocytes Absolute: 0.5 10*3/uL (ref 0.1–1.0)
Monocytes Relative: 6.4 % (ref 3.0–12.0)
Neutro Abs: 4.6 10*3/uL (ref 1.4–7.7)
Neutrophils Relative %: 60.6 % (ref 43.0–77.0)
Platelets: 407 10*3/uL — ABNORMAL HIGH (ref 150.0–400.0)
RBC: 3.97 Mil/uL (ref 3.87–5.11)
RDW: 18 % — ABNORMAL HIGH (ref 11.5–15.5)
WBC: 7.5 10*3/uL (ref 4.0–10.5)

## 2021-09-09 ENCOUNTER — Other Ambulatory Visit: Payer: Self-pay | Admitting: *Deleted

## 2021-09-09 DIAGNOSIS — R710 Precipitous drop in hematocrit: Secondary | ICD-10-CM

## 2021-09-09 DIAGNOSIS — Z471 Aftercare following joint replacement surgery: Secondary | ICD-10-CM | POA: Diagnosis not present

## 2021-09-09 DIAGNOSIS — E1140 Type 2 diabetes mellitus with diabetic neuropathy, unspecified: Secondary | ICD-10-CM

## 2021-09-09 DIAGNOSIS — L4050 Arthropathic psoriasis, unspecified: Secondary | ICD-10-CM

## 2021-09-09 DIAGNOSIS — Z96611 Presence of right artificial shoulder joint: Secondary | ICD-10-CM | POA: Diagnosis not present

## 2021-09-11 ENCOUNTER — Other Ambulatory Visit: Payer: Self-pay

## 2021-09-11 ENCOUNTER — Other Ambulatory Visit (INDEPENDENT_AMBULATORY_CARE_PROVIDER_SITE_OTHER): Payer: Medicare PPO

## 2021-09-11 DIAGNOSIS — R710 Precipitous drop in hematocrit: Secondary | ICD-10-CM | POA: Diagnosis not present

## 2021-09-11 DIAGNOSIS — K921 Melena: Secondary | ICD-10-CM

## 2021-09-11 LAB — FECAL OCCULT BLOOD, IMMUNOCHEMICAL: Fecal Occult Bld: POSITIVE — AB

## 2021-09-14 ENCOUNTER — Encounter: Payer: Self-pay | Admitting: Gastroenterology

## 2021-09-15 ENCOUNTER — Ambulatory Visit: Payer: Medicare PPO | Admitting: Podiatry

## 2021-09-18 ENCOUNTER — Encounter: Payer: Self-pay | Admitting: Family Medicine

## 2021-09-18 ENCOUNTER — Ambulatory Visit (INDEPENDENT_AMBULATORY_CARE_PROVIDER_SITE_OTHER): Payer: Medicare PPO | Admitting: Family Medicine

## 2021-09-18 VITALS — BP 134/78 | HR 100 | Temp 98.3°F | Ht 61.5 in | Wt 230.4 lb

## 2021-09-18 DIAGNOSIS — I499 Cardiac arrhythmia, unspecified: Secondary | ICD-10-CM | POA: Diagnosis not present

## 2021-09-18 DIAGNOSIS — K921 Melena: Secondary | ICD-10-CM | POA: Diagnosis not present

## 2021-09-18 DIAGNOSIS — C50412 Malignant neoplasm of upper-outer quadrant of left female breast: Secondary | ICD-10-CM

## 2021-09-18 DIAGNOSIS — M5416 Radiculopathy, lumbar region: Secondary | ICD-10-CM | POA: Diagnosis not present

## 2021-09-18 DIAGNOSIS — E1140 Type 2 diabetes mellitus with diabetic neuropathy, unspecified: Secondary | ICD-10-CM | POA: Diagnosis not present

## 2021-09-18 DIAGNOSIS — F331 Major depressive disorder, recurrent, moderate: Secondary | ICD-10-CM | POA: Diagnosis not present

## 2021-09-18 DIAGNOSIS — Z170 Estrogen receptor positive status [ER+]: Secondary | ICD-10-CM | POA: Diagnosis not present

## 2021-09-18 DIAGNOSIS — L4050 Arthropathic psoriasis, unspecified: Secondary | ICD-10-CM

## 2021-09-18 MED ORDER — CYCLOBENZAPRINE HCL 10 MG PO TABS: 10.0000 mg | ORAL_TABLET | Freq: Every evening | ORAL | 1 refills | Status: DC | PRN

## 2021-09-18 NOTE — Progress Notes (Unsigned)
Subjective:     Patient ID: Brittany Acosta, female    DOB: 1950/06/13, 71 y.o.   MRN: [id number]  Chief Complaint  Patient presents with   Transfer of Care    Need refill of flexeril     HPI TOC [place]-complex pt Refill flexeril-chronic back pain-rad to legs.  Worse at hs.  R>L.   Injection by Dr. Mina Marble helped some.  Sees him next wk.  Has had 2 back surgeries-cyst on spine, then fusion.  Told needs surgery, but pt doesn't want so trying injections.  Takes flexeril intermitt.  Will take 2 at HS so '10mg'$  total.   Back doc Dubonski-gabapentin  Bipolar-not seeing psych-"too many doctors".  No SI  Dm neuropathy-was seeing pod-will see next wk.  Low sugars-usu in am-62 this am 70's 2 other days.  Only checks in am unless feels "weak"  Psoriatic arthritis-sees rheum.  Can't do new meds d/t cost. Dr. Jenetta Downer  Hgb low-will see Dimas Chyle.  Hemocult +.  No nsaid's  HA past 2 wks-will occ take tylenol.    Health Maintenance Due  Topic Date Due   FOOT EXAM  02/07/2021    Past Medical History:  Diagnosis Date   Allergic rhinitis, cause unspecified    Arthritis    Bipolar disorder, unspecified ([place])    depression   Cancer ([place])    skin cancer on left leg, left breast cancer   Chronic diastolic heart failure (HCC)    Depression    GERD (gastroesophageal reflux disease)    Headache    History of gout    "haven't had it in several years" (02/08/2012)   History of kidney stones    History of wrist fracture    rt wrist   Hypertension    Iron deficiency anemia    Kidney stones    sees urologist @ [facility]   Morbid obesity ([place])    Neuropathy due to secondary diabetes ([place])    Nontoxic multinodular goiter    Osteoporosis    Personal history of radiation therapy    Pneumonia    PONV (postoperative nausea and vomiting)    PTSD (post-traumatic stress disorder)    Pure hypercholesterolemia    RBBB    Scleroderma ([place])    Sleep apnea 10/2012   mild osa-did not need cpap -dr clance   Type  II diabetes mellitus ([place])    Unspecified hypothyroidism     Past Surgical History:  Procedure Laterality Date   ABDOMINAL HYSTERECTOMY  1976   BREAST EXCISIONAL BIOPSY Left    BREAST LUMPECTOMY Left 04/25/2018   BREAST LUMPECTOMY WITH RADIOACTIVE SEED AND SENTINEL LYMPH NODE BIOPSY Left 04/25/2018   Procedure: LEFT BREAST LUMPECTOMY WITH RADIOACTIVE SEED AND SENTINEL LYMPH NODE BIOPSY;  Surgeon: Excell Seltzer, MD;  Location: [facility];  Service: General;  Laterality: Left;   [place]  2001   sees Dr Peter Martinique   CATARACT EXTRACTION  2014   CATARACT EXTRACTION, BILATERAL  02/2011   epps   CHOLECYSTECTOMY  1985   EXCISIONAL HEMORRHOIDECTOMY     "dr cut out in his office" (02/08/2012)   [place]  2000   right   JOINT REPLACEMENT     rt knee   KNEE ARTHROSCOPY  08/2009   right   KNEE ARTHROSCOPY WITH MEDIAL MENISECTOMY     left   Left Cystoscopy   [place]   Lithotripsy (L) Kidney  1997   LUMBAR LAMINECTOMY/DECOMPRESSION MICRODISCECTOMY Left 07/01/2016   Procedure: Laminectomy for synovial cyst - left - Lumbar four-five;  Surgeon: Earnie Larsson, MD;  Location: [facility];  Service: Neurosurgery;  Laterality: Left;   PARTIAL MASTECTOMY WITH NEEDLE LOCALIZATION Left 09/01/2012   Procedure: PARTIAL MASTECTOMY WITH NEEDLE LOCALIZATION;  Surgeon: Adin Hector, MD;  Location: [facility];  Service: General;  Laterality: Left;   [place] stone removed (L) Kidney  1992   REVERSE SHOULDER ARTHROPLASTY Right 02/12/2021   Procedure: REVERSE SHOULDER ARTHROPLASTY;  Surgeon: Tania Ade, MD;  [facility];  Service: Orthopedics;  Laterality: Right;   Right nasal surgery  08/1988   Right sinus removed  08/1989   tooth partial   ROTATOR CUFF REPAIR  2013   right shoulder x 2   SHOULDER ARTHROSCOPY WITH ROTATOR CUFF REPAIR AND SUBACROMIAL DECOMPRESSION Left 08/20/2013   Procedure: SHOULDER ARTHROSCOPY  AND SUBACROMIAL  DECOMPRESSION;  Surgeon: Nita Sells, MD;  Location: [facility];  Service: Orthopedics;  Laterality: Left;  Left shoulder arthroscopy, debridement, subacromial decompression, distal clavical resection   THYROIDECTOMY  04/22/2011   Procedure: THYROIDECTOMY;  Surgeon: Onnie Graham, MD;  Location: [facility];  Service: ENT;  Laterality: N/A;  TOTAL THYROIDECOTMY   TOTAL KNEE ARTHROPLASTY  06/18/2011   Procedure: TOTAL KNEE ARTHROPLASTY;  Surgeon: Kerin Salen, MD;  Location: [facility];  Service: Orthopedics;  Laterality: Left;  DEPUY   TOTAL KNEE ARTHROPLASTY  02/07/2012   Procedure: TOTAL KNEE ARTHROPLASTY;  Surgeon: Kerin Salen, MD;  Location: [facility];  Service: Orthopedics;  Laterality: Right;   TUBAL LIGATION  1972    Outpatient Medications Prior to Visit  Medication Sig Dispense Refill   allopurinol (ZYLOPRIM) 300 MG tablet TAKE 1 TABLET EVERY DAY 90 tablet 0   anastrozole (ARIMIDEX) 1 MG tablet TAKE 1 TABLET EVERY DAY 90 tablet 0   aspirin EC 81 MG tablet Take 1 tablet (81 mg total) by mouth daily. Swallow whole. 90 tablet 3   Biotin 10000 MCG TABS Take 1 tablet by mouth daily in the afternoon.     Cholecalciferol (VITAMIN D3) 125 MCG (5000 UT) CAPS Take 1 capsule by mouth daily in the afternoon.     cyclobenzaprine (FLEXERIL) 5 MG tablet Take 1 tablet (5 mg total) by mouth at bedtime as needed for muscle spasms. 90 tablet 0   doxepin (SINEQUAN) 10 MG capsule TAKE 1 CAPSULE EVERY DAY 90 capsule 0   ELDERBERRY PO Take 5,000 capsules by mouth daily in the afternoon.     fluticasone (FLONASE) 50 MCG/ACT nasal spray USE 2 SPRAYS IN EACH NOSTRIL EVERY DAY 48 g 1   folic acid (FOLVITE) 1 MG tablet Take 2 mg by mouth daily.     furosemide (LASIX) 40 MG tablet Take 1 tablet (40 mg total) by mouth daily. 90 tablet 3   gabapentin (NEURONTIN) 300 MG capsule Take 300 mg by mouth 3 (three) times daily.     Ginkgo Biloba (GINKOBA PO) Take 3,000 capsules by mouth daily in the  afternoon.     glucose blood (TRUE METRIX BLOOD GLUCOSE TEST) test strip USE TO CHECK BLOOD SUGAR DAILY AND PRN 100 each 4   Insulin Pen Needle (DROPLET PEN NEEDLES) 31G X 5 MM MISC USE TO INJECT INSULIN DAILY 100 each 3   lamoTRIgine (LAMICTAL) 200 MG tablet Take 1 tablet (200 mg total) by mouth daily at 12 noon. 90 tablet 3   levothyroxine (SYNTHROID) 50 MCG tablet  TAKE 1 TABLET EVERY DAY (OFFICE VISIT NEEDED FOR FURTHER REFILLS) 90 tablet 1   lisinopril (ZESTRIL) 5 MG tablet TAKE 1 TABLET EVERY DAY 90 tablet 1   loratadine (CLARITIN) 10 MG tablet 1 tablet     metFORMIN (GLUCOPHAGE) 500 MG tablet TAKE 2 TABLETS ('1000MG'$ ) EVERY MORNING AND TAKE 1 TABLET ('500MG'$ ) EVERY EVENING 270 tablet 1   methotrexate 2.5 MG tablet Take 15 mg by mouth every Saturday.     omeprazole (PRILOSEC) 20 MG capsule Take 1 capsule (20 mg total) by mouth daily. Take 30 minutes before a meal 90 capsule 3   oxybutynin (DITROPAN-XL) 10 MG 24 hr tablet 1 tablet     potassium citrate (UROCIT-K) 10 MEQ (1080 MG) SR tablet Take 1 tablet (10 mEq total) by mouth daily in the afternoon. 90 tablet 1   pravastatin (PRAVACHOL) 20 MG tablet TAKE 1 TABLET (20 MG TOTAL) BY MOUTH AT BEDTIME. 90 tablet 0   predniSONE (DELTASONE) 5 MG tablet Take 5 mg by mouth daily with breakfast.      sulfaSALAzine (AZULFIDINE) 500 MG tablet Take 2 tablets by mouth 2 (two) times daily.     tamsulosin (FLOMAX) 0.4 MG CAPS capsule TAKE 1 CAPSULE EVERY DAY 90 capsule 1   TRESIBA FLEXTOUCH 100 UNIT/ML FlexTouch Pen INJECT 26 UNITS SUBCUTANEOUSLY DAILY 30 mL 1   sertraline (ZOLOFT) 100 MG tablet TAKE 1 TABLET (100 MG TOTAL) BY MOUTH DAILY. 90 tablet 0   cetirizine (ZYRTEC) 10 MG tablet Take 20 mg by mouth daily.      sulfaSALAzine (AZULFIDINE) 500 MG tablet Take 2 tablets (1,000 mg total) by mouth 2 (two) times daily.     No facility-administered medications prior to visit.    No Known Allergies ROS neg/noncontributory except as noted HPI/below       Objective:     BP 134/78   Pulse 100   Temp 98.3 F (36.8 C) (Temporal)   Ht 5' 1.5" (1.562 m)   Wt 230 lb 6 oz (104.5 kg)   SpO2 96%   BMI 42.82 kg/m  Wt Readings from Last 3 Encounters:  09/18/21 230 lb 6 oz (104.5 kg)  08/24/21 232 lb 8 oz (105.5 kg)  02/04/21 240 lb 9.6 oz (109.1 kg)    Physical Exam   Gen: WDWN NAD MOWF HEENT: NCAT, conjunctiva not injected, sclera nonicteric NECK:  supple, no thyromegaly, no nodes, no carotid bruits CARDIAC: irRRR, S1S2+, no murmur. DP 2+B LUNGS: CTAB. No wheezes ABDOMEN:  BS+, soft, NTND, No HSM, no masses EXT:  no edema MSK: no gross abnormalities.  NEURO: A&O x3.  CN II-XII intact.  PSYCH: normal mood. Good eye contact     Assessment & Plan:   Problem List Items Addressed This Visit   None  CCM No orders of the defined types were placed in this encounter.   Wellington Hampshire, MD

## 2021-09-18 NOTE — Patient Instructions (Addendum)
It was very nice to see you today!  Decrease Tresiba to 23 units.   Eat a protein snack tonight.  Cyclobenzaprine(flexeril)  '10mg'$  at bed or 1/2 tab.   PLEASE NOTE:  If you had any lab tests please let us know if you have not heard back within a few days. You may see your results on MyChart before we have a chance to review them but we will give you a call once they are reviewed by [place]. If we ordered any referrals today, please let us know if you have not heard from their office within the next week.   Please try these tips to maintain a healthy lifestyle:  Eat most of your calories during the day when you are active. Eliminate processed foods including packaged sweets (pies, cakes, cookies), reduce intake of potatoes, white bread, white pasta, and white rice. Look for whole grain options, oat flour or almond flour.  Each meal should contain half fruits/vegetables, one quarter protein, and one quarter carbs (no bigger than a computer mouse).  Cut down on sweet beverages. This includes juice, soda, and sweet tea. Also watch fruit intake, though this is a healthier sweet option, it still contains natural sugar! Limit to 3 servings daily.  Drink at least 1 glass of water with each meal and aim for at least 8 glasses per day  Exercise at least 150 minutes every week.

## 2021-09-20 DIAGNOSIS — L4050 Arthropathic psoriasis, unspecified: Secondary | ICD-10-CM | POA: Insufficient documentation

## 2021-09-21 ENCOUNTER — Ambulatory Visit: Payer: Medicare PPO | Admitting: Podiatry

## 2021-09-21 ENCOUNTER — Telehealth: Payer: Self-pay

## 2021-09-21 DIAGNOSIS — M47816 Spondylosis without myelopathy or radiculopathy, lumbar region: Secondary | ICD-10-CM | POA: Diagnosis not present

## 2021-09-21 NOTE — Chronic Care Management (AMB) (Signed)
  Chronic Care Management   Note  09/21/2021 Name: Brittany Acosta MRN: [id number] DOB: Nov 11, 1950  Brittany Acosta is a 71 y.o. year old female who is a primary care patient of Tawnya Crook, MD. I reached out to Laurena Slimmer by phone today in response to a referral sent by Brittany Acosta PCP.  Brittany Acosta was given information about Chronic Care Management services today including:  CCM service includes personalized support from designated clinical staff supervised by her physician, including individualized plan of care and coordination with other care providers 24/7 contact phone numbers for assistance for urgent and routine care needs. Service will only be billed when office clinical staff spend 20 minutes or more in a month to coordinate care. Only one practitioner may furnish and bill the service in a calendar month. The patient may stop CCM services at any time (effective at the end of the month) by phone call to the office staff. The patient is responsible for co-pay (up to 20% after annual deductible is met) if co-pay is required by the individual health plan.   Patient agreed to services and verbal consent obtained.   Follow up plan: Telephone appointment with care management team member scheduled for:09/24/2021  Noreene Larsson, [place], [place], [place] [address] Direct Dial: [phone] [email or web address]

## 2021-09-22 ENCOUNTER — Encounter: Payer: Self-pay | Admitting: Podiatry

## 2021-09-22 ENCOUNTER — Ambulatory Visit: Payer: Medicare PPO | Admitting: Podiatry

## 2021-09-22 DIAGNOSIS — M19079 Primary osteoarthritis, unspecified ankle and foot: Secondary | ICD-10-CM | POA: Diagnosis not present

## 2021-09-22 DIAGNOSIS — M778 Other enthesopathies, not elsewhere classified: Secondary | ICD-10-CM | POA: Diagnosis not present

## 2021-09-22 MED ORDER — TRIAMCINOLONE ACETONIDE 40 MG/ML IJ SUSP
20.0000 mg | Freq: Once | INTRAMUSCULAR | Status: AC
  Administered 2021-09-22: 20 mg

## 2021-09-22 NOTE — Progress Notes (Signed)
Subjective:  Patient ID: Brittany Acosta, female    DOB: 1950-05-12,  MRN: [id number] HPI Chief Complaint  Patient presents with   Foot Pain    Diabetic neuropathy - takes gabapentin '300mg'$  TID for her back, 2nd toe left sticks straight up and toenail is dark cause it rubs shoe, hallux left is crooked, interested in diabetic shoes and insoles   New Patient (Initial Visit)    Est pt 71    71 y.o. female presents with the above complaint.   ROS: Denies fever chills nausea vomiting muscle aches pains calf pain back pain chest pain shortness of breath.  Past Medical History:  Diagnosis Date   Allergic rhinitis, cause unspecified    Arthritis    Bipolar disorder, unspecified ([place])    depression   Cancer ([place])    skin cancer on left leg, left breast cancer   Chronic diastolic heart failure (HCC)    Depression    GERD (gastroesophageal reflux disease)    Headache    History of gout    "haven't had it in several years" (02/08/2012)   History of kidney stones    History of wrist fracture    rt wrist   Hypertension    Iron deficiency anemia    Kidney stones    sees urologist @ [facility]   Morbid obesity ([place])    Neuropathy due to secondary diabetes ([place])    Nontoxic multinodular goiter    Osteoporosis    Personal history of radiation therapy    Pneumonia    PONV (postoperative nausea and vomiting)    PTSD (post-traumatic stress disorder)    Pure hypercholesterolemia    RBBB    Scleroderma ([place])    Sleep apnea 10/2012   mild osa-did not need cpap -dr clance   Type II diabetes mellitus ([place])    Unspecified hypothyroidism    Past Surgical History:  Procedure Laterality Date   ABDOMINAL HYSTERECTOMY  1976   BREAST EXCISIONAL BIOPSY Left    BREAST LUMPECTOMY Left 04/25/2018   BREAST LUMPECTOMY WITH RADIOACTIVE SEED AND SENTINEL LYMPH NODE BIOPSY Left 04/25/2018   Procedure: LEFT BREAST LUMPECTOMY WITH RADIOACTIVE SEED AND SENTINEL LYMPH NODE BIOPSY;  Surgeon: Excell Seltzer,  MD;  Location: [facility];  Service: General;  Laterality: Left;   [place]  2001   sees Dr Peter Martinique   CATARACT EXTRACTION  2014   CATARACT EXTRACTION, BILATERAL  02/2011   epps   CHOLECYSTECTOMY  1985   EXCISIONAL HEMORRHOIDECTOMY     "dr cut out in his office" (02/08/2012)   [place]  2000   right   JOINT REPLACEMENT     rt knee   KNEE ARTHROSCOPY  08/2009   right   KNEE ARTHROSCOPY WITH MEDIAL MENISECTOMY     left   Left Cystoscopy   [place]   Lithotripsy (L) Kidney  1997   LUMBAR LAMINECTOMY/DECOMPRESSION MICRODISCECTOMY Left 07/01/2016   Procedure: Laminectomy for synovial cyst - left - Lumbar four-five;  Surgeon: Earnie Larsson, MD;  Location: [facility];  Service: Neurosurgery;  Laterality: Left;   PARTIAL MASTECTOMY WITH NEEDLE LOCALIZATION Left 09/01/2012   Procedure: PARTIAL MASTECTOMY WITH NEEDLE LOCALIZATION;  Surgeon: Adin Hector, MD;  Location: [facility];  Service: General;  Laterality: Left;   [place] stone removed (L) Kidney  1992   REVERSE SHOULDER ARTHROPLASTY Right 02/12/2021   Procedure: REVERSE SHOULDER ARTHROPLASTY;  Surgeon: Tania Ade,  MD;  [facility];  Service: Orthopedics;  Laterality: Right;   Right nasal surgery  08/1988   Right sinus removed  08/1989   tooth partial   ROTATOR CUFF REPAIR  2013   right shoulder x 2   SHOULDER ARTHROSCOPY WITH ROTATOR CUFF REPAIR AND SUBACROMIAL DECOMPRESSION Left 08/20/2013   Procedure: SHOULDER ARTHROSCOPY  AND SUBACROMIAL DECOMPRESSION;  Surgeon: Nita Sells, MD;  Location: [facility];  Service: Orthopedics;  Laterality: Left;  Left shoulder arthroscopy, debridement, subacromial decompression, distal clavical resection   THYROIDECTOMY  04/22/2011   Procedure: THYROIDECTOMY;  Surgeon: Onnie Graham, MD;  Location: [facility];  Service: ENT;  Laterality: N/A;  TOTAL THYROIDECOTMY   TOTAL KNEE ARTHROPLASTY  06/18/2011    Procedure: TOTAL KNEE ARTHROPLASTY;  Surgeon: Kerin Salen, MD;  Location: [facility];  Service: Orthopedics;  Laterality: Left;  DEPUY   TOTAL KNEE ARTHROPLASTY  02/07/2012   Procedure: TOTAL KNEE ARTHROPLASTY;  Surgeon: Kerin Salen, MD;  Location: [facility];  Service: Orthopedics;  Laterality: Right;   TUBAL LIGATION  1972    Current Outpatient Medications:    allopurinol (ZYLOPRIM) 300 MG tablet, TAKE 1 TABLET EVERY DAY, Disp: 90 tablet, Rfl: 0   anastrozole (ARIMIDEX) 1 MG tablet, TAKE 1 TABLET EVERY DAY, Disp: 90 tablet, Rfl: 0   aspirin EC 81 MG tablet, Take 1 tablet (81 mg total) by mouth daily. Swallow whole., Disp: 90 tablet, Rfl: 3   Biotin 10000 MCG TABS, Take 1 tablet by mouth daily in the afternoon., Disp: , Rfl:    Cholecalciferol (VITAMIN D3) 125 MCG (5000 UT) CAPS, Take 1 capsule by mouth daily in the afternoon., Disp: , Rfl:    cyclobenzaprine (FLEXERIL) 10 MG tablet, Take 1 tablet (10 mg total) by mouth at bedtime as needed for muscle spasms., Disp: 90 tablet, Rfl: 1   doxepin (SINEQUAN) 10 MG capsule, TAKE 1 CAPSULE EVERY DAY, Disp: 90 capsule, Rfl: 0   ELDERBERRY PO, Take 5,000 capsules by mouth daily in the afternoon., Disp: , Rfl:    fluticasone (FLONASE) 50 MCG/ACT nasal spray, USE 2 SPRAYS IN EACH NOSTRIL EVERY DAY, Disp: 48 g, Rfl: 1   folic acid (FOLVITE) 1 MG tablet, Take 2 mg by mouth daily., Disp: , Rfl:    furosemide (LASIX) 40 MG tablet, Take 1 tablet (40 mg total) by mouth daily., Disp: 90 tablet, Rfl: 3   gabapentin (NEURONTIN) 300 MG capsule, Take 300 mg by mouth 3 (three) times daily., Disp: , Rfl:    Ginkgo Biloba (GINKOBA PO), Take 3,000 capsules by mouth daily in the afternoon., Disp: , Rfl:    glucose blood (TRUE METRIX BLOOD GLUCOSE TEST) test strip, USE TO CHECK BLOOD SUGAR DAILY AND PRN, Disp: 100 each, Rfl: 4   Insulin Pen Needle (DROPLET PEN NEEDLES) 31G X 5 MM MISC, USE TO INJECT INSULIN DAILY, Disp: 100 each, Rfl: 3   lamoTRIgine (LAMICTAL) 200 MG tablet,  Take 1 tablet (200 mg total) by mouth daily at 12 noon., Disp: 90 tablet, Rfl: 3   levothyroxine (SYNTHROID) 50 MCG tablet, TAKE 1 TABLET EVERY DAY (OFFICE VISIT NEEDED FOR FURTHER REFILLS), Disp: 90 tablet, Rfl: 1   lisinopril (ZESTRIL) 5 MG tablet, TAKE 1 TABLET EVERY DAY, Disp: 90 tablet, Rfl: 1   loratadine (CLARITIN) 10 MG tablet, 1 tablet, Disp: , Rfl:    metFORMIN (GLUCOPHAGE) 500 MG tablet, TAKE 2 TABLETS ('1000MG'$ ) EVERY MORNING AND TAKE 1 TABLET ('500MG'$ ) EVERY EVENING, Disp: 270  tablet, Rfl: 1   methotrexate 2.5 MG tablet, Take 15 mg by mouth every Saturday., Disp: , Rfl:    omeprazole (PRILOSEC) 20 MG capsule, Take 1 capsule (20 mg total) by mouth daily. Take 30 minutes before a meal, Disp: 90 capsule, Rfl: 3   oxybutynin (DITROPAN-XL) 10 MG 24 hr tablet, 1 tablet, Disp: , Rfl:    potassium citrate (UROCIT-K) 10 MEQ (1080 MG) SR tablet, Take 1 tablet (10 mEq total) by mouth daily in the afternoon., Disp: 90 tablet, Rfl: 1   pravastatin (PRAVACHOL) 20 MG tablet, TAKE 1 TABLET (20 MG TOTAL) BY MOUTH AT BEDTIME., Disp: 90 tablet, Rfl: 0   predniSONE (DELTASONE) 5 MG tablet, Take 5 mg by mouth daily with breakfast. , Disp: , Rfl:    sertraline (ZOLOFT) 100 MG tablet, TAKE 1 TABLET (100 MG TOTAL) BY MOUTH DAILY., Disp: 90 tablet, Rfl: 0   sulfaSALAzine (AZULFIDINE) 500 MG tablet, Take 2 tablets by mouth 2 (two) times daily., Disp: , Rfl:    tamsulosin (FLOMAX) 0.4 MG CAPS capsule, TAKE 1 CAPSULE EVERY DAY, Disp: 90 capsule, Rfl: 1   TRESIBA FLEXTOUCH 100 UNIT/ML FlexTouch Pen, INJECT 26 UNITS SUBCUTANEOUSLY DAILY, Disp: 30 mL, Rfl: 1  No Known Allergies Review of Systems Objective:  There were no vitals filed for this visit.  General: Well developed, nourished, in no acute distress, alert and oriented x3   Dermatological: Skin is warm, dry and supple bilateral. Nails x 10 are well maintained; remaining integument appears unremarkable at this time. There are no open sores, no preulcerative  lesions, no rash or signs of infection present.  Vascular: Dorsalis Pedis artery and Posterior Tibial artery pedal pulses are 2/4 bilateral with immedate capillary fill time. Pedal hair growth present. No varicosities and no lower extremity edema present bilateral.   Neruologic: Grossly intact via light touch bilateral. Vibratory intact via tuning fork bilateral. Protective threshold with Semmes Wienstein monofilament intact to all pedal sites bilateral. Patellar and Achilles deep tendon reflexes 2+ bilateral. No Babinski or clonus noted bilateral.   Musculoskeletal: No gross boney pedal deformities bilateral. No pain, crepitus, or limitation noted with foot and ankle range of motion bilateral. Muscular strength 5/5 in all groups tested bilateral.  Gait: Unassisted, Nonantalgic.    Radiographs:  None taken  Assessment & Plan:   Assessment: Osteoarthritic changes dorsal aspect bilateral foot  Plan: Capsulitis neuritis dorsal aspect bilateral foot.  Injected the dorsal aspect of the foot 10 mg Kenalog milligrams Marcaine point maximal tenderness bilateral dorsum of the foot.  Tolerated procedure well without complications.  She also be scheduled for Aaron Edelman for diabetic shoes and will follow-up with me in about 2 months     Brittany Acosta, [place]

## 2021-09-24 ENCOUNTER — Ambulatory Visit (INDEPENDENT_AMBULATORY_CARE_PROVIDER_SITE_OTHER): Payer: Medicare PPO | Admitting: *Deleted

## 2021-09-24 DIAGNOSIS — I152 Hypertension secondary to endocrine disorders: Secondary | ICD-10-CM

## 2021-09-24 DIAGNOSIS — E1140 Type 2 diabetes mellitus with diabetic neuropathy, unspecified: Secondary | ICD-10-CM

## 2021-09-24 NOTE — Chronic Care Management (AMB) (Signed)
  Care Management   Follow Up Note   09/24/2021 Name: Brittany Acosta MRN: [id number] DOB: [birth year]   Referred by: Tawnya Crook, MD Reason for referral : Chronic Care Management (INITIAL)   Successful outreach to patient.   RNCM introduced self and role; patient verbally consents to outreaches, does request call back in about 4 weeks to complete assessment.  Also agrees to [place] referral placed by primary care for medication, she is not sure of the name at this time.  Follow Up Plan: The care management team will reach out to the patient again over the next 45 days.   Hubert Azure RN, MSN RN Care Management Coordinator  [facility] [phone] [email or web address]

## 2021-09-28 ENCOUNTER — Telehealth: Payer: Self-pay | Admitting: Pharmacist

## 2021-09-28 ENCOUNTER — Telehealth: Payer: Self-pay | Admitting: Family Medicine

## 2021-09-28 NOTE — Progress Notes (Signed)
Chronic Care Management Pharmacy Note  09/29/2021 Name:  Brittany Acosta MRN:  [id number] DOB:  [birth year]  Summary: Initial visit with PharmD.   Patient medications reviewed.  No concerns with cost at this time.  Using pill box to organize medications.  She mentions sleep as one of her biggest concerns.  Denies lack of motivation or naps during the daytime.  She loves to make jewelry for hobby.  Hypoglycemia has improved since decreased dose of Tresiba.  Now eating a snack at bedtime and will consider shakes in the morning.  Recommendations/Changes made from today's visit: Well balanced meals throughout the day to avoid hypo   Plan: Fu with PharmD 6 months HC outreach 3 months to assess glucose and BP   Subjective: Brittany Acosta is an 71 y.o. year old female who is a primary patient of Jeani Sow, MD.  The CCM team was consulted for assistance with disease management and care coordination needs.    Engaged with patient face to face for initial visit in response to provider referral for pharmacy case management and/or care coordination services.   Consent to Services:  The patient was given the following information about Chronic Care Management services today, agreed to services, and gave verbal consent: 1. CCM service includes personalized support from designated clinical staff supervised by the primary care provider, including individualized plan of care and coordination with other care providers 2. 24/7 contact phone numbers for assistance for urgent and routine care needs. 3. Service will only be billed when office clinical staff spend 20 minutes or more in a month to coordinate care. 4. Only one practitioner may furnish and bill the service in a calendar month. 5.The patient may stop CCM services at any time (effective at the end of the month) by phone call to the office staff. 6. The patient will be responsible for cost sharing (co-pay) of up to 20% of the service fee (after annual  deductible is met). Patient agreed to services and consent obtained.  Patient Care Team: Jeani Sow, MD as PCP - General (Family Medicine) Jodelle Red, MD as PCP - Cardiology (Cardiology) Romero Belling, MD (Inactive) as Consulting Physician (Endocrinology) Gean Birchwood, MD as Consulting Physician (Orthopedic Surgery) Charna Elizabeth, MD as Consulting Physician (Gastroenterology) Christia Reading, MD as Consulting Physician (Otolaryngology) Kalman Shan, MD (Pulmonary Disease) Alvan Dame, DPM (Podiatry) Teresa Coombs, MD (Ophthalmology) Archer Asa, MD (Psychiatry) Jones Broom, MD (Orthopedic Surgery) Serena Croissant, MD as Consulting Physician (Hematology and Oncology) Charlcie Cradle as Radiation Oncologist (Radiology) Casimer Lanius, MD as Consulting Physician (Rheumatology) Andrena Mews, DO as Referring Physician (Sports Medicine) Maple Mirza, RN as [facility] Erroll Luna, [facility] as Pharmacist (Pharmacist)  Recent office visits:  09/18/2021 OV (PCP) Jeani Sow, MD; Will renew Flexeril at 10 mg p.o. at at bedtime.  (She was taking 2 of the 5mg  tabs).  Is getting a lot of low sugars.  A1c in May was 6.7.  Advised to continue metformin 1000 mg twice daily.  Decrease Tresiba to 23 units.  Monitor sugars.  Advised to occasionally check later in the day.  If continues to be low, decreased by 3 units again, but let me know.    08/24/2021 OV (Fam Med) Jarold Motto, PA; Will have her hold her biotin and then we will update blood work in 3 days or so Update TSH and ferritin specifically Discussed that I would not start estrogen for thinning hair due  to hx of breast cancer   Recent consult visits:  09/22/2021 OV (Podiatry) [place], [place], [place]; no medication changes indicated.   Hospital visits:  None in previous 6 months   Objective:  Lab Results  Component Value Date   CREATININE 0.63 08/28/2021   BUN 10  08/28/2021   GFR 89.48 08/28/2021   EGFR 90 06/16/2021   GFRNONAA >60 02/12/2021   GFRAA >60 09/14/2019   NA 142 08/28/2021   K 4.2 08/28/2021   CALCIUM 9.3 08/28/2021   CO2 34 (H) 08/28/2021   GLUCOSE 134 (H) 08/28/2021    Lab Results  Component Value Date/Time   HGBA1C 6.7 (H) 09/01/2021 10:46 AM   HGBA1C 6.7 (H) 01/29/2021 08:51 AM   FRUCTOSAMINE 237 08/27/2010 02:52 PM   GFR 89.48 08/28/2021 10:59 AM   GFR 88.27 06/24/2020 11:08 AM   MICROALBUR 1.4 08/28/2021 10:59 AM   MICROALBUR <0.7 06/26/2015 12:13 PM    Last diabetic Eye exam:  Lab Results  Component Value Date/Time   HMDIABEYEEXA No Retinopathy 06/03/2021 12:00 AM    Last diabetic Foot exam: No results found for: "HMDIABFOOTEX"   Lab Results  Component Value Date   CHOL 135 08/28/2021   HDL 60.60 08/28/2021   LDLCALC 61 08/28/2021   LDLDIRECT 56.4 06/11/2011   TRIG 67.0 08/28/2021   CHOLHDL 2 08/28/2021       Latest Ref Rng & Units 08/28/2021   10:59 AM 06/16/2021   12:00 AM 05/29/2021   12:00 AM  Hepatic Function  Total Protein 6.0 - 8.3 g/dL 6.4     Albumin 3.5 - 5.2 g/dL 4.0  4.6     4.6      AST 0 - 37 U/L 16  18     18       ALT 0 - 35 U/L 15  15     15       Alk Phosphatase 39 - 117 U/L 106  122     122      Total Bilirubin 0.2 - 1.2 mg/dL 0.4        This result is from an external source.    Lab Results  Component Value Date/Time   TSH 2.67 08/28/2021 10:59 AM   TSH 2.93 02/27/2020 10:52 AM       Latest Ref Rng & Units 09/07/2021    9:55 AM 08/28/2021   10:59 AM 06/16/2021   12:00 AM  CBC  WBC 4.0 - 10.5 K/uL 7.5  8.1  10.5      Hemoglobin 12.0 - 15.0 g/dL 19.1  47.8  29.5      Hematocrit 36.0 - 46.0 % 33.7  35.2  36      Platelets 150.0 - 400.0 K/uL 407.0  508.0  487         This result is from an external source.    Lab Results  Component Value Date/Time   VD25OH 46 03/19/2013 01:41 PM   VD25OH 59 08/27/2010 02:52 PM    Clinical ASCVD: Yes  The 10-year ASCVD risk score  (Arnett DK, et al., 2019) is: 24.7%   Values used to calculate the score:     Age: 82 years     Sex: Female     Is Non-Hispanic African American: No     Diabetic: Yes     Tobacco smoker: No     Systolic Blood Pressure: 134 mmHg     Is BP treated: Yes     HDL Cholesterol: 60.6  mg/dL     Total Cholesterol: 135 mg/dL       4/74/2595   63:87 AM 02/27/2020   10:26 AM 12/28/2019    2:23 PM  Depression screen PHQ 2/9  Decreased Interest 0 0 1  Down, Depressed, Hopeless 0 0 1  PHQ - 2 Score 0 0 2  Altered sleeping   3  Tired, decreased energy   3  Change in appetite   3  Feeling bad or failure about yourself    0  Trouble concentrating   1  Moving slowly or fidgety/restless   1  Suicidal thoughts   0  PHQ-9 Score   13  Difficult doing work/chores   Somewhat difficult      Social History   Tobacco Use  Smoking Status Former   Packs/day: 1.00   Years: 6.00   Total pack years: 6.00   Types: Cigarettes   Quit date: 04/06/1983   Years since quitting: 38.5  Smokeless Tobacco Never  Tobacco Comments   Married, lives with spouse. Disable- 2 grown kids-6 g-kids   BP Readings from Last 3 Encounters:  09/18/21 134/78  08/24/21 140/80  02/12/21 (!) 144/62   Pulse Readings from Last 3 Encounters:  09/18/21 100  08/24/21 71  02/12/21 76   Wt Readings from Last 3 Encounters:  09/18/21 230 lb 6 oz (104.5 kg)  08/24/21 232 lb 8 oz (105.5 kg)  02/04/21 240 lb 9.6 oz (109.1 kg)   BMI Readings from Last 3 Encounters:  09/18/21 42.82 kg/m  08/24/21 43.22 kg/m  02/04/21 44.72 kg/m    Assessment/Interventions: Review of patient past medical history, allergies, medications, health status, including review of consultants reports, laboratory and other test data, was performed as part of comprehensive evaluation and provision of chronic care management services.   SDOH:  (Social Determinants of Health) assessments and interventions performed: Yes  Financial Resource Strain: Not  on file   Food Insecurity: Not on file    SDOH Screenings   Alcohol Screen: Not on file  Depression (PHQ2-9): Low Risk  (06/24/2020)   Depression (PHQ2-9)    PHQ-2 Score: 0  Financial Resource Strain: Not on file  Food Insecurity: Not on file  Housing: Not on file  Physical Activity: Not on file  Social Connections: Not on file  Stress: Not on file  Tobacco Use: Medium Risk (09/22/2021)   Patient History    Smoking Tobacco Use: Former    Smokeless Tobacco Use: Never    Passive Exposure: Not on file  Transportation Needs: Not on file    CCM Care Plan  No Known Allergies  Medications Reviewed Today     Reviewed by Erroll Luna, [facility] (Pharmacist) on 09/29/21 at 1334  Med List Status: <None>   Medication Order Taking? Sig Documenting Provider Last Dose Status Informant  allopurinol (ZYLOPRIM) 300 MG tablet [id number] Yes TAKE 1 TABLET EVERY DAY Ardith Dark, MD Taking Active   anastrozole (ARIMIDEX) 1 MG tablet [id number] Yes TAKE 1 TABLET EVERY DAY Jarold Motto, [place] Taking Active   aspirin EC 81 MG tablet [id number] Yes Take 1 tablet (81 mg total) by mouth daily. Swallow whole. Jodelle Red, MD Taking Active   Biotin 35573 MCG TABS [id number] Yes Take 1 tablet by mouth daily in the afternoon. [provider] Taking Active   Cholecalciferol (VITAMIN D3) 125 MCG (5000 UT) CAPS [id number] Yes Take 1 capsule by mouth daily in the afternoon. [provider] Taking Active   cyclobenzaprine (FLEXERIL)  10 MG tablet [id number] Yes Take 1 tablet (10 mg total) by mouth at bedtime as needed for muscle spasms. Jeani Sow, MD Taking Active   doxepin [facility]) 10 MG capsule [id number] Yes TAKE 1 CAPSULE EVERY DAY Jarold Motto, [place] Taking Active   ELDERBERRY PO [id number] Yes Take 5,000 capsules by mouth daily in the afternoon. [provider] Taking Active   fluticasone (FLONASE) 50 MCG/ACT nasal spray [id number] Yes USE 2 SPRAYS IN EACH NOSTRIL  EVERY DAY Ardith Dark, MD Taking Active   folic acid (FOLVITE) 1 MG tablet [id number] Yes Take 2 mg by mouth daily. [provider] Taking Active Self  furosemide (LASIX) 40 MG tablet [id number] Yes Take 1 tablet (40 mg total) by mouth daily. Jarold Motto, [place] Taking Active   gabapentin (NEURONTIN) 300 MG capsule [id number] Yes Take 300 mg by mouth 3 (three) times daily. [provider] Taking Active   Ginkgo Biloba (GINKOBA PO) [id number] Yes Take 3,000 capsules by mouth daily in the afternoon. [provider] Taking Active   glucose blood (TRUE METRIX BLOOD GLUCOSE TEST) test strip [id number] Yes USE TO CHECK BLOOD SUGAR DAILY AND PRN Jarold Motto, PA Taking Active   Insulin Pen Needle (DROPLET PEN NEEDLES) 31G X 5 MM MISC [id number] Yes USE TO INJECT INSULIN DAILY Jarold Motto, PA Taking Active Self  lamoTRIgine (LAMICTAL) 200 MG tablet [id number] Yes Take 1 tablet (200 mg total) by mouth daily at 12 noon. Jarold Motto, [place] Taking Active   levothyroxine (SYNTHROID) 50 MCG tablet [id number] Yes TAKE 1 TABLET EVERY DAY (OFFICE VISIT NEEDED FOR FURTHER REFILLS) Jarold Motto, PA Taking Active   lisinopril (ZESTRIL) 5 MG tablet [id number] Yes TAKE 1 TABLET EVERY DAY [place], [place], [place] Taking Active   loratadine (CLARITIN) 10 MG tablet [id number] Yes 1 tablet [provider] Taking Active   metFORMIN (GLUCOPHAGE) 500 MG tablet [id number] Yes TAKE 2 TABLETS (1000MG ) EVERY MORNING AND TAKE 1 TABLET (500MG ) EVERY [place] Razor, [place], [place] Taking Active   methotrexate 2.5 MG tablet [id number] Yes Take 15 mg by mouth every Saturday. [provider] Taking Active Self  omeprazole (PRILOSEC) 20 MG capsule [id number] Yes Take 1 capsule (20 mg total) by mouth daily. Take 30 minutes before a meal [place], [place], [place] Taking Active   oxybutynin (DITROPAN-XL) 10 MG 24 hr tablet [id number] Yes 1 tablet [provider] Taking Active   potassium citrate  (UROCIT-K) 10 MEQ (1080 MG) SR tablet [id number] Yes Take 1 tablet (10 mEq total) by mouth daily in the afternoon. Jarold Motto, [place] Taking Active   pravastatin (PRAVACHOL) 20 MG tablet [id number] Yes TAKE 1 TABLET (20 MG TOTAL) BY MOUTH AT BEDTIME. Jarold Motto, [place] Taking Active   predniSONE (DELTASONE) 5 MG tablet [id number] Yes Take 5 mg by mouth daily with breakfast.  [provider] Taking Active Self  sertraline (ZOLOFT) 100 MG tablet [id number]  TAKE 1 TABLET (100 MG TOTAL) BY MOUTH DAILY. Jarold Motto, [place]  Expired 09/17/21 2359   sulfaSALAzine (AZULFIDINE) 500 MG tablet [id number] Yes Take 2 tablets by mouth 2 (two) times daily. [provider] Taking Active   tamsulosin (FLOMAX) 0.4 MG CAPS capsule [id number] Yes TAKE 1 CAPSULE EVERY DAY Jarold Motto, [place] Taking Active   TRESIBA FLEXTOUCH 100 UNIT/ML FlexTouch Pen [id number] Yes INJECT 26 UNITS SUBCUTANEOUSLY DAILY Ardith Dark, MD Taking Active             Patient Active Problem List   Diagnosis Date Noted  Psoriatic arthropathy (HCC) 09/20/2021   Carpal tunnel syndrome of left wrist 07/02/2020   Pain of left hand 07/02/2020   Rheumatoid arthritis (HCC) 05/07/2020   Inflammatory polyarthritis (HCC) 05/07/2020   Orbit fracture, left, closed, initial encounter (HCC) 04/15/2020   Bilateral leg edema 01/25/2020   Family history of heart disease 01/25/2020   Essential hypertension 01/25/2020   Diabetes mellitus type 2 in obese (HCC) 01/25/2020   Right carotid bruit 12/25/2018   Stress incontinence in female 12/06/2018   Antalgic gait 12/02/2018   Malignant neoplasm of upper-outer quadrant of left breast in female, estrogen receptor positive (HCC) 05/10/2018   Temporomandibular joint (TMJ) pain 03/23/2018   Tophus of toe concurrent with and due to gout 12/16/2017   Mass in the abdomen 03/17/2017   Degenerative lumbar spinal stenosis 02/10/2017   Spondylolisthesis at L4-L5 level 02/10/2017   Cervical  radiculopathy 01/18/2017   Nausea 12/23/2016   Depression, major, recurrent, moderate (HCC) 11/19/2016   Diabetic neuropathy, painful (HCC) 11/19/2016   Hypothyroidism, 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/19/2016   OSA (obstructive sleep apnea) 11/19/2016   PTSD (post-traumatic stress disorder) 11/19/2016   Chronic low back pain 11/19/2016   Gout, 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   Poorly controlled type 2 diabetes mellitus with circulatory disorder (HCC) 06/10/2015   Right bundle branch block 04/18/2015   Bifascicular block 04/18/2015   Radiculopathy, lumbar region 05/31/2014   Greater trochanteric bursitis of left hip 04/22/2014   Achilles tendinosis 02/19/2014   Gait instability 11/14/2013   Recurrent falls 11/14/2013   Multiple fractures 11/14/2013   Calculus of kidney 07/25/2013   Hypercalciuria 07/25/2013   Pain in joint involving ankle and foot 07/02/2013   Peripheral polyneuropathy 09/13/2012   Migraine 07/10/2012   Osteoarthritis of right knee 02/10/2012   Osteoarthritis of left knee 06/21/2011   GERD (gastroesophageal reflux disease) 02/09/2011   Vitamin D deficiency 08/27/2010   Fibromyalgia 03/23/2010   Goiter 03/24/2009   Edema 09/06/2008   Morbid (severe) obesity due to excess calories (HCC) 09/03/2008   Hypercholesterolemia 06/10/2008   Bipolar 1 disorder (HCC) 06/10/2008   Hypertension associated with diabetes (HCC) 06/10/2008   Allergic rhinitis 06/10/2008    Immunization History  Administered Date(s) Administered   Fluad Quad(high Dose 65+) 01/22/2021   Influenza Split 04/23/2011, 01/05/2012   Influenza Whole 01/08/2008, 01/13/2009   Influenza, High Dose Seasonal PF 12/15/2015, 12/15/2016, 01/07/2018   Influenza,inj,Quad PF,6+ Mos 12/06/2012, 12/18/2014   Influenza-Unspecified 04/23/2011, 01/05/2012, 12/06/2012, 12/18/2014, 01/16/2020   PFIZER(Purple Top)SARS-COV-2 Vaccination 06/14/2019, 07/10/2019, 12/21/2019   Pneumococcal Conjugate-13 08/08/2017   Pneumococcal Polysaccharide-23  04/23/2011, 06/24/2020   Td 04/05/2004, 04/30/2013, 09/14/2017   Tdap 09/13/2017   Zoster Recombinat (Shingrix) 09/14/2017, 12/02/2017   Zoster, Live 09/07/2013   Zoster, Unspecified 12/02/2017    Conditions to be addressed/monitored:  HTN, GERD, Type II DM w/ neuropathy, HLD, Depression/Bipolar  Care Plan : General Pharmacy (Adult)  Updates made by Erroll Luna, RPH since 09/29/2021 12:00 AM     Problem: HTN, GERD, Type II DM w/ neuropathy, HLD, Depression/Bipolar   Priority: High  Onset Date: 09/29/2021     Long-Range Goal: Patient-Specific Goal   Start Date: 09/29/2021  Expected End Date: 03/31/2022  This Visit's Progress: On track  Priority: High  Note:   Current Barriers:  Sleep hypoglycemia  Pharmacist Clinical Goal(s):  Patient will achieve improvement in glucose control as evidenced by glucose logs through collaboration with PharmD and provider.   Interventions: 1:1 collaboration with Jeani Sow, MD regarding development and update of comprehensive plan of  care as evidenced by provider attestation and co-signature Inter-disciplinary care team collaboration (see longitudinal plan of care) Comprehensive medication review performed; medication list updated in electronic medical record  Hypertension (BP goal <140/90) -Controlled -Current treatment: Lisinopril 5mg  Appropriate, Effective, Safe, Accessible -Medications previously tried: HCTZ  -Current home readings: not checking at home, does have cuff available -Current dietary habits: "does not like to eat", sounds like only eating about twice per day.  Knows she does not get recommended servings of fruits and veggies -Current exercise habits: minimal, she falls a lot and has pain in feet/back so exercise is a barrier -Denies hypotensive/hypertensive symptoms -Educated on BP goals and benefits of medications for prevention of heart attack, stroke and kidney damage; Exercise goal of 150 minutes per  week; Importance of home blood pressure monitoring; Symptoms of hypotension and importance of maintaining adequate hydration; -Counseled to monitor BP at home once or twice per week, document, and provide log at future appointments -Recommended to continue current medication Monitor at home if possible, recommended some exercise, but want her to be safe as she is a fall risk  Hyperlipidemia: (LDL goal < 70) -Controlled -Current treatment: Pravastatin 20mg  Appropriate, Effective, Safe, Accessible -Medications previously tried: none noted  -Current dietary patterns: see HTN -Current exercise habits: see HTN -Educated on Cholesterol goals;  Benefits of statin for ASCVD risk reduction; Importance of limiting foods high in cholesterol; -Recommended to continue current medication Most recent LDL well controlled, tolerating medication well.  No need to change at this time.  Diabetes (A1c goal <7%) -Controlled -Current medications: Metformin 500mg  2 tablet qam and 1 tablet qpm Appropriate, Effective, Safe, Accessible Tresiba 23 units Appropriate, Effective, Query Safe,  Gabapentin 300mg  tid Appropriate, Effective, Safe, Accessible -Medications previously tried: Novolog, Levemir  -Current home glucose readings fasting glucose: 100-120s lately post prandial glucose: does not usually check -Denies hypoglycemic/hyperglycemic symptoms -Current exercise: minimal, see HTN -Educated on A1c and blood sugar goals; Complications of diabetes including kidney damage, retinal damage, and cardiovascular disease; Exercise goal of 150 minutes per week; Prevention and management of hypoglycemic episodes; Benefits of routine self-monitoring of blood sugar; -Counseled to check feet daily and get yearly eye exams -Recommended to continue current medication Recommended she reach out with any more hypoglycemia episodes.  Discussed trying to eat regular meals throughout the day and a snack before bed with  protein to avoid hypoglycemia.  Would love for her to get some exercise but due to fall risk that is a challenge.  Depression/Bipolar (Goal: Improve sleep, mood) -Controlled -Current treatment: Sertraline 100mg  daily Appropriate, Effective, Safe, Accessible Lamotrigine 200mg  daily Appropriate, Effective, Safe, Accessible Doxepin 10mg  daily Appropriate, Effective, Safe, Accessible -Medications previously tried/failed: mirtazapine, Effexor, venlafaxine -PHQ9:     06/24/2020   10:06 AM 02/27/2020   10:26 AM 12/28/2019    2:23 PM  PHQ9 SCORE ONLY  PHQ-9 Total Score 0 0 13  -Educated on Benefits of medication for symptom control She does mention sleep is an ongoing concern she sometimes does not get in bed until 2 am and is up by 6am.  She is tired sometimes during the day, but does think this interferes with her day to day activities. -Recommended to continue current medication Will consult with PCP on sleep disturbances.  Advised her to write down her thoughts if her mind is racing at night.  GERD (Goal: Minimize symptoms) -Controlled -Current treatment  Omeprazole 20mg  Appropriate, Effective, Safe, Accessible -Medications previously tried: none noted -Taking appropriately on an empty stomach.  Denies any symptoms of GERD at this time.  -Recommended to continue current medication  Hypothyroidism (Goal: Maintain TSH) -Controlled -Current treatment  Levothyroxine Appropriate, Effective, Safe, Accessible -Medications previously tried: none noted -Taking appropriately, TSH is well controlled.  -Recommended to continue current medication  Patient Goals/Self-Care Activities Patient will:  - take medications as prescribed as evidenced by patient report and record review focus on medication adherence by pill box check blood pressure a few times per week, document, and provide at future appointments  Follow Up Plan: The care management team will reach out to the patient again over  the next 6 months.         Medication Assistance: None required.  Patient affirms current coverage meets needs.  Compliance/Adherence/Medication fill history: Care Gaps: Foot Exam - ordered  Star-Rating Drugs: Lisinopril 5 mg last filled 09/06/2021 90 DS Metformin 500 mg last filled 08/08/2021 90 DS Pravastatin 20 mg last filled 08/29/2021 90 DS  Patient's preferred pharmacy is:  [facility] [address] [place], [place] - [address] [place] [address] Phone: [phone] Fax: [phone]  [facility] - [place], [place] - [address] [place] [address] Phone: [phone] Fax: [phone]  Uses pill box? Yes Pt endorses 100% compliance  We discussed: Current pharmacy is preferred with insurance plan and patient is satisfied with pharmacy services Patient decided to: Continue current medication management strategy  Care Plan and Follow Up Patient Decision:  Patient agrees to Care Plan and Follow-up.  Plan: The care management team will reach out to the patient again over the next 180 days.  Willa Frater, PharmD Clinical Pharmacist  [facility] [phone]

## 2021-09-29 ENCOUNTER — Ambulatory Visit: Payer: Medicare PPO | Admitting: Pharmacist

## 2021-09-29 DIAGNOSIS — E890 Postprocedural hypothyroidism: Secondary | ICD-10-CM

## 2021-09-29 DIAGNOSIS — E1140 Type 2 diabetes mellitus with diabetic neuropathy, unspecified: Secondary | ICD-10-CM

## 2021-09-29 DIAGNOSIS — I10 Essential (primary) hypertension: Secondary | ICD-10-CM

## 2021-09-29 DIAGNOSIS — F331 Major depressive disorder, recurrent, moderate: Secondary | ICD-10-CM

## 2021-09-29 DIAGNOSIS — E1169 Type 2 diabetes mellitus with other specified complication: Secondary | ICD-10-CM

## 2021-10-02 DIAGNOSIS — Z794 Long term (current) use of insulin: Secondary | ICD-10-CM | POA: Diagnosis not present

## 2021-10-02 DIAGNOSIS — E1159 Type 2 diabetes mellitus with other circulatory complications: Secondary | ICD-10-CM | POA: Diagnosis not present

## 2021-10-02 DIAGNOSIS — F319 Bipolar disorder, unspecified: Secondary | ICD-10-CM | POA: Diagnosis not present

## 2021-10-02 DIAGNOSIS — E039 Hypothyroidism, unspecified: Secondary | ICD-10-CM

## 2021-10-02 DIAGNOSIS — E785 Hyperlipidemia, unspecified: Secondary | ICD-10-CM | POA: Diagnosis not present

## 2021-10-02 DIAGNOSIS — I10 Essential (primary) hypertension: Secondary | ICD-10-CM

## 2021-10-02 DIAGNOSIS — Z7984 Long term (current) use of oral hypoglycemic drugs: Secondary | ICD-10-CM

## 2021-10-08 ENCOUNTER — Other Ambulatory Visit: Payer: Self-pay | Admitting: Family Medicine

## 2021-10-12 ENCOUNTER — Ambulatory Visit: Payer: Medicare PPO

## 2021-10-12 NOTE — Progress Notes (Signed)
Reason for Consult: Evaluation for Prefabricated Diabetic Shoes and Custom Diabetic Inserts.  Physician Treating Diabetes:               ORTHOTIC RECOMMENDATION Recommended Devices: - 1 pair of diabetic shoes; --- Patient Selected Preston 7.5 WIDE - 3 pair custom-to-patient vacuum formed diabetic insoles  ACTIONS PERFORMED Potential out of pocket cost was communicated to patient. Patient understood and consented to measurement and casting. Patient was casted for insoles via crush box and measured for shoes. dressed. Casts shipped to central fabrication for HOLD until Certificate of Medical Necessity or otherwise necessary authorization from insurance is obtained.   PLAN Shoes are to be ordered and casts released from hold once all appropriate paperwork is complete. Patient is to be contacted and scheduled for fitting once shoes and insoles have been fabricated and received.

## 2021-10-14 ENCOUNTER — Telehealth: Payer: Self-pay | Admitting: Gastroenterology

## 2021-10-14 ENCOUNTER — Ambulatory Visit: Payer: Medicare PPO | Admitting: Gastroenterology

## 2021-10-14 NOTE — Telephone Encounter (Signed)
Good Morning Dr. Mauricia Area,  Patient called stating that she needed to cancel her appointment with you this morning at 9:30 due to her husband being in the hospital.  Patient stated that she will call back at a later time to reschedule.

## 2021-10-22 ENCOUNTER — Ambulatory Visit (INDEPENDENT_AMBULATORY_CARE_PROVIDER_SITE_OTHER): Payer: Medicare PPO | Admitting: *Deleted

## 2021-10-22 DIAGNOSIS — E1169 Type 2 diabetes mellitus with other specified complication: Secondary | ICD-10-CM

## 2021-10-22 DIAGNOSIS — I10 Essential (primary) hypertension: Secondary | ICD-10-CM

## 2021-10-22 NOTE — Chronic Care Management (AMB) (Signed)
  Care Management   Follow Up Note   10/22/2021 Name: Brittany Acosta MRN: [id number] DOB: 04-Mar-1951   Referred by: Tawnya Crook, MD Reason for referral : Chronic Care Management   Successful telephone outreach to patient for initial telephone assessment.  RNCM introduced self and role.  Patient reports she has spoken with CCM Pharmacist, discussed medications and diabetes.  Feels she is managing well just working with the pharmacy team at this time.  Discussed hypoglycemia and ways to help reduce  episodes.  Encouraged patient to contact RNCM in future if needs arise.  Follow Up Plan: No further follow up required: as patient decline further RNCM outreaches but staying active with ccm pharmacist.  Hubert Azure RN, MSN RN Care Management Coordinator  [facility] [phone] [email or web address]

## 2021-10-22 NOTE — Patient Instructions (Signed)
Visit Information  Thank you for allowing me to share the care management and care coordination services that are available to you as part of your health plan and services through your primary care provider and medical home. Please reach out to me at [phone] if the care management/care coordination team may be of assistance to you in the future.   Liberato Stansbery RN, MSN RN Care Management Coordinator   [facility] [phone] [email or web address]  

## 2021-10-28 ENCOUNTER — Other Ambulatory Visit: Payer: Self-pay | Admitting: Physician Assistant

## 2021-10-30 ENCOUNTER — Ambulatory Visit (INDEPENDENT_AMBULATORY_CARE_PROVIDER_SITE_OTHER): Payer: Medicare PPO | Admitting: Family Medicine

## 2021-10-30 ENCOUNTER — Encounter: Payer: Self-pay | Admitting: Family Medicine

## 2021-10-30 VITALS — BP 132/80 | HR 76 | Temp 98.0°F | Ht 61.5 in | Wt 223.5 lb

## 2021-10-30 DIAGNOSIS — E1140 Type 2 diabetes mellitus with diabetic neuropathy, unspecified: Secondary | ICD-10-CM

## 2021-10-30 DIAGNOSIS — M1A9XX1 Chronic gout, unspecified, with tophus (tophi): Secondary | ICD-10-CM | POA: Diagnosis not present

## 2021-10-30 DIAGNOSIS — D500 Iron deficiency anemia secondary to blood loss (chronic): Secondary | ICD-10-CM | POA: Diagnosis not present

## 2021-10-30 NOTE — Progress Notes (Unsigned)
Subjective:     Patient ID: Brittany Acosta, female    DOB: June 19, 1950, 71 y.o.   MRN: [id number]  Chief Complaint  Patient presents with   Follow-up    4-6 week follow-up on HTN, DM    HPI-here w/husp  HTN-pt not sure.  On lisinopril poss more for renal protection.   DM type 2 w/neuropathy-saw pod about 3 wks ago.  Metformin 2 in am 1 in pm.  Tyler Aas 23-no lows.  Doing better.  123.  No lows.  Balance off.  Fell again-landed on R arm and getting pain now back of shoulder and getting neck/ha on R-2 wks ago. Didn't hit head.  No LOC.  Also hit R knee.  Got up out of chair to get something out of drawer and fell.  Fell in yard for no good reason.  Legs chronically numb. Has done PT-quit d/t cost. Won't use cane/walker.  Gout-doesn't feel it, but will gt red/hot-great toe R usu.  Occurs about every 6 wks for 1-2 days.  Anemia-had to change GI appt d/t husb had valve replaced.   Health Maintenance Due  Topic Date Due   FOOT EXAM  02/07/2021    Past Medical History:  Diagnosis Date   Allergic rhinitis, cause unspecified    Arthritis    Bipolar disorder, unspecified ([place])    depression   Cancer ([place])    skin cancer on left leg, left breast cancer   Chronic diastolic heart failure (HCC)    Depression    GERD (gastroesophageal reflux disease)    Headache    History of gout    "haven't had it in several years" (02/08/2012)   History of kidney stones    History of wrist fracture    rt wrist   Hypertension    Iron deficiency anemia    Kidney stones    sees urologist @ [facility]   Morbid obesity ([place])    Neuropathy due to secondary diabetes ([place])    Nontoxic multinodular goiter    Osteoporosis    Personal history of radiation therapy    Pneumonia    PONV (postoperative nausea and vomiting)    PTSD (post-traumatic stress disorder)    Pure hypercholesterolemia    RBBB    Scleroderma ([place])    Sleep apnea 10/2012   mild osa-did not need cpap -dr clance   Type II diabetes mellitus  ([place])    Unspecified hypothyroidism     Past Surgical History:  Procedure Laterality Date   ABDOMINAL HYSTERECTOMY  1976   BREAST EXCISIONAL BIOPSY Left    BREAST LUMPECTOMY Left 04/25/2018   BREAST LUMPECTOMY WITH RADIOACTIVE SEED AND SENTINEL LYMPH NODE BIOPSY Left 04/25/2018   Procedure: LEFT BREAST LUMPECTOMY WITH RADIOACTIVE SEED AND SENTINEL LYMPH NODE BIOPSY;  Surgeon: Excell Seltzer, MD;  Location: [place];  Service: General;  Laterality: Left;   [place]  2001   sees Dr Peter Martinique   CATARACT EXTRACTION  2014   CATARACT EXTRACTION, BILATERAL  02/2011   epps   CHOLECYSTECTOMY  1985   EXCISIONAL HEMORRHOIDECTOMY     "dr cut out in his office" (02/08/2012)   [place]  2000   right   JOINT REPLACEMENT     rt knee   KNEE ARTHROSCOPY  08/2009   right   KNEE ARTHROSCOPY WITH MEDIAL MENISECTOMY     left   Left Cystoscopy   [place]  Lithotripsy (L) Kidney  1997   LUMBAR LAMINECTOMY/DECOMPRESSION MICRODISCECTOMY Left 07/01/2016   Procedure: Laminectomy for synovial cyst - left - Lumbar four-five;  Surgeon: Earnie Larsson, MD;  Location: [place];  Service: Neurosurgery;  Laterality: Left;   PARTIAL MASTECTOMY WITH NEEDLE LOCALIZATION Left 09/01/2012   Procedure: PARTIAL MASTECTOMY WITH NEEDLE LOCALIZATION;  Surgeon: Adin Hector, MD;  Location: [place];  Service: General;  Laterality: Left;   [place]erc[place] stone removed (L) Kidney  1992   REVERSE SHOULDER ARTHROPLASTY Right 02/12/2021   Procedure: REVERSE SHOULDER ARTHROPLASTY;  Surgeon: Tania Ade, MD;  [facility];  Service: Orthopedics;  Laterality: Right;   Right nasal surgery  08/1988   Right sinus removed  08/1989   tooth partial   ROTATOR CUFF REPAIR  2013   right shoulder x 2   SHOULDER ARTHROSCOPY WITH ROTATOR CUFF REPAIR AND SUBACROMIAL DECOMPRESSION Left 08/20/2013   Procedure: SHOULDER ARTHROSCOPY  AND SUBACROMIAL DECOMPRESSION;   Surgeon: Nita Sells, MD;  Location: [place];  Service: Orthopedics;  Laterality: Left;  Left shoulder arthroscopy, debridement, subacromial decompression, distal clavical resection   THYROIDECTOMY  04/22/2011   Procedure: THYROIDECTOMY;  Surgeon: Onnie Graham, MD;  Location: [place];  Service: ENT;  Laterality: N/A;  TOTAL THYROIDECOTMY   TOTAL KNEE ARTHROPLASTY  06/18/2011   Procedure: TOTAL KNEE ARTHROPLASTY;  Surgeon: Kerin Salen, MD;  Location: [place];  Service: Orthopedics;  Laterality: Left;  DEPUY   TOTAL KNEE ARTHROPLASTY  02/07/2012   Procedure: TOTAL KNEE ARTHROPLASTY;  Surgeon: Kerin Salen, MD;  Location: [place];  Service: Orthopedics;  Laterality: Right;   TUBAL LIGATION  1972    Outpatient Medications Prior to Visit  Medication Sig Dispense Refill   allopurinol (ZYLOPRIM) 300 MG tablet TAKE 1 TABLET EVERY DAY 90 tablet 0   anastrozole (ARIMIDEX) 1 MG tablet TAKE 1 TABLET EVERY DAY 90 tablet 0   aspirin EC 81 MG tablet Take 1 tablet (81 mg total) by mouth daily. Swallow whole. 90 tablet 3   Biotin 10000 MCG TABS Take 1 tablet by mouth daily in the afternoon.     Cholecalciferol (VITAMIN D3) 125 MCG (5000 UT) CAPS Take 1 capsule by mouth daily in the afternoon.     cyclobenzaprine (FLEXERIL) 10 MG tablet Take 1 tablet (10 mg total) by mouth at bedtime as needed for muscle spasms. 90 tablet 1   doxepin (SINEQUAN) 10 MG capsule TAKE 1 CAPSULE EVERY DAY 90 capsule 0   ELDERBERRY PO Take 5,000 capsules by mouth daily in the afternoon.     fluticasone (FLONASE) 50 MCG/ACT nasal spray USE 2 SPRAYS IN EACH NOSTRIL EVERY DAY 48 g 1   folic acid (FOLVITE) 1 MG tablet Take 2 mg by mouth daily.     furosemide (LASIX) 40 MG tablet Take 1 tablet (40 mg total) by mouth daily. 90 tablet 3   gabapentin (NEURONTIN) 300 MG capsule Take 300 mg by mouth 3 (three) times daily.     Ginkgo Biloba (GINKOBA PO) Take 3,000 capsules by mouth daily in the afternoon.      glucose blood (TRUE METRIX BLOOD GLUCOSE TEST) test strip TEST BLOOD SUGAR EVERY DAY AND AS NEEDED 200 strip 4   Insulin Pen Needle (DROPLET PEN NEEDLES) 31G X 5 MM MISC USE TO INJECT INSULIN DAILY 100 each 3   lamoTRIgine (LAMICTAL) 200 MG tablet Take 1 tablet (200 mg total) by mouth daily at 12 noon. 90 tablet 3   levothyroxine (  SYNTHROID) 50 MCG tablet TAKE 1 TABLET EVERY DAY (OFFICE VISIT NEEDED FOR FURTHER REFILLS) 90 tablet 1   lisinopril (ZESTRIL) 5 MG tablet TAKE 1 TABLET EVERY DAY 90 tablet 1   loratadine (CLARITIN) 10 MG tablet 1 tablet     metFORMIN (GLUCOPHAGE) 500 MG tablet TAKE 2 TABLETS ('1000MG'$ ) EVERY MORNING AND TAKE 1 TABLET ('500MG'$ ) EVERY EVENING 270 tablet 1   methotrexate 2.5 MG tablet Take 15 mg by mouth every Saturday.     omeprazole (PRILOSEC) 20 MG capsule Take 1 capsule (20 mg total) by mouth daily. Take 30 minutes before a meal 90 capsule 3   oxybutynin (DITROPAN-XL) 10 MG 24 hr tablet 1 tablet     potassium citrate (UROCIT-K) 10 MEQ (1080 MG) SR tablet Take 1 tablet (10 mEq total) by mouth daily in the afternoon. 90 tablet 1   pravastatin (PRAVACHOL) 20 MG tablet TAKE 1 TABLET (20 MG TOTAL) BY MOUTH AT BEDTIME. 90 tablet 0   predniSONE (DELTASONE) 5 MG tablet Take 5 mg by mouth daily with breakfast.      sulfaSALAzine (AZULFIDINE) 500 MG tablet Take 2 tablets by mouth 2 (two) times daily.     tamsulosin (FLOMAX) 0.4 MG CAPS capsule TAKE 1 CAPSULE EVERY DAY 90 capsule 1   TRESIBA FLEXTOUCH 100 UNIT/ML FlexTouch Pen INJECT 26 UNITS SUBCUTANEOUSLY DAILY 30 mL 1   sertraline (ZOLOFT) 100 MG tablet TAKE 1 TABLET (100 MG TOTAL) BY MOUTH DAILY. 90 tablet 0   No facility-administered medications prior to visit.    No Known Allergies ROS neg/noncontributory except as noted HPI/below      Objective:     BP 132/80   Pulse 76   Temp 98 F (36.7 C) (Temporal)   Ht 5' 1.5" (1.562 m)   Wt 223 lb 8 oz (101.4 kg)   SpO2 96%   BMI 41.55 kg/m  Wt Readings from Last 3  Encounters:  10/30/21 223 lb 8 oz (101.4 kg)  09/18/21 230 lb 6 oz (104.5 kg)  08/24/21 232 lb 8 oz (105.5 kg)    Physical Exam   Gen: WDWN NAD HEENT: NCAT, conjunctiva not injected, sclera nonicteric NECK:  supple, no thyromegaly, no nodes, no carotid bruits CARDIAC: RRR, S1S2+, no murmur. DP 2+B LUNGS: CTAB. No wheezes ABDOMEN:  BS+, soft, NTND, No HSM, no masses EXT:  no edema MSK: no gross abnormalities.  NEURO: A&O x3.  CN II-XII intact.  PSYCH: normal mood. Good eye contact     Assessment & Plan:   Problem List Items Addressed This Visit   None   No orders of the defined types were placed in this encounter.   Wellington [place], MD

## 2021-10-30 NOTE — Patient Instructions (Signed)
Have son look into apple watch(or similar device)to wear all the time-or life alert, etc.  Have him look for a [employer] place.  Use the Cane.

## 2021-10-31 NOTE — Assessment & Plan Note (Signed)
Work on diet.  Limited exercise d/t neuropathy and falls

## 2021-11-02 DIAGNOSIS — I10 Essential (primary) hypertension: Secondary | ICD-10-CM

## 2021-11-02 DIAGNOSIS — E1169 Type 2 diabetes mellitus with other specified complication: Secondary | ICD-10-CM | POA: Diagnosis not present

## 2021-11-02 DIAGNOSIS — E669 Obesity, unspecified: Secondary | ICD-10-CM

## 2021-11-05 DIAGNOSIS — M47816 Spondylosis without myelopathy or radiculopathy, lumbar region: Secondary | ICD-10-CM | POA: Diagnosis not present

## 2021-11-09 DIAGNOSIS — M25511 Pain in right shoulder: Secondary | ICD-10-CM | POA: Diagnosis not present

## 2021-11-11 ENCOUNTER — Other Ambulatory Visit: Payer: Self-pay | Admitting: Physician Assistant

## 2021-11-13 ENCOUNTER — Other Ambulatory Visit: Payer: Self-pay | Admitting: Physician Assistant

## 2021-11-13 DIAGNOSIS — R221 Localized swelling, mass and lump, neck: Secondary | ICD-10-CM | POA: Diagnosis not present

## 2021-11-22 ENCOUNTER — Emergency Department (HOSPITAL_COMMUNITY)
Admission: EM | Admit: 2021-11-22 | Discharge: 2021-11-23 | Discharge status: Against Medical Advice | Payer: Medicare PPO | Attending: Emergency Medicine | Admitting: Emergency Medicine

## 2021-11-22 ENCOUNTER — Other Ambulatory Visit: Payer: Self-pay

## 2021-11-22 ENCOUNTER — Encounter (HOSPITAL_COMMUNITY): Payer: Self-pay

## 2021-11-22 DIAGNOSIS — Z5321 Procedure and treatment not carried out due to patient leaving prior to being seen by health care provider: Secondary | ICD-10-CM | POA: Diagnosis not present

## 2021-11-22 DIAGNOSIS — R519 Headache, unspecified: Secondary | ICD-10-CM | POA: Insufficient documentation

## 2021-11-22 DIAGNOSIS — M542 Cervicalgia: Secondary | ICD-10-CM | POA: Diagnosis not present

## 2021-11-22 LAB — BASIC METABOLIC PANEL
Anion gap: 10 (ref 5–15)
BUN: 6 mg/dL — ABNORMAL LOW (ref 8–23)
CO2: 28 mmol/L (ref 22–32)
Calcium: 8.9 mg/dL (ref 8.9–10.3)
Chloride: 94 mmol/L — ABNORMAL LOW (ref 98–111)
Creatinine, Ser: 0.6 mg/dL (ref 0.44–1.00)
GFR, Estimated: >60 mL/min (ref >60)
Glucose, Bld: 82 mg/dL (ref 70–99)
Potassium: 3.6 mmol/L (ref 3.5–5.1)
Sodium: 132 mmol/L — ABNORMAL LOW (ref 135–145)

## 2021-11-22 LAB — CBC WITH DIFFERENTIAL/PLATELET
Abs Immature Granulocytes: 0.06 10*3/uL (ref 0.00–0.07)
Basophils Absolute: 0 10*3/uL (ref 0.0–0.1)
Basophils Relative: 0 %
Eosinophils Absolute: 0.3 10*3/uL (ref 0.0–0.5)
Eosinophils Relative: 3 %
HCT: 33.2 % — ABNORMAL LOW (ref 36.0–46.0)
Hemoglobin: 10.8 g/dL — ABNORMAL LOW (ref 12.0–15.0)
Immature Granulocytes: 1 %
Lymphocytes Relative: 29 %
Lymphs Abs: 2.8 10*3/uL (ref 0.7–4.0)
MCH: 27.8 pg (ref 26.0–34.0)
MCHC: 32.5 g/dL (ref 30.0–36.0)
MCV: 85.6 fL (ref 80.0–100.0)
Monocytes Absolute: 0.7 10*3/uL (ref 0.1–1.0)
Monocytes Relative: 8 %
Neutro Abs: 5.9 10*3/uL (ref 1.7–7.7)
Neutrophils Relative %: 59 %
Platelets: 435 10*3/uL — ABNORMAL HIGH (ref 150–400)
RBC: 3.88 MIL/uL (ref 3.87–5.11)
RDW: 16.6 % — ABNORMAL HIGH (ref 11.5–15.5)
WBC: 9.8 10*3/uL (ref 4.0–10.5)
nRBC: 0 % (ref 0.0–0.2)

## 2021-11-22 NOTE — ED Provider Triage Note (Signed)
Emergency Medicine Provider Triage Evaluation Note  Brittany Acosta , a 71 y.o. female  was evaluated in triage.  Pt complains of neck pain that radiates up to her head ongoing for the past couple weeks.  Saw ENT on 8/11 and was given reassurance.  States at that time she also had a knot.  States not has improved but pain persist.  Exam reassuring.  No swelling noted.  Denies shortness of breath.  Review of Systems  Positive: As above Negative: As above  Physical Exam  BP (!) 177/73 (BP Location: Right Arm)   Pulse 69   Temp 97.9 F (36.6 C) (Oral)   Resp 18   SpO2 96%  Gen:   Awake, no distress   Resp:  Normal effort  MSK:   Moves extremities without difficulty  Other:    Medical Decision Making  Medically screening exam initiated at 2:26 PM.  Appropriate orders placed.  Brittany Acosta was informed that the remainder of the evaluation will be completed by another provider, this initial triage assessment does not replace that evaluation, and the importance of remaining in the ED until their evaluation is complete.     Brittany Courier, PA-C 11/22/21 1427

## 2021-11-22 NOTE — ED Triage Notes (Signed)
Patient complains of right sided headache with right sided neck pain, has seen ENT for same. Patient reports that the symptoms started 2 weeks ago. Denies trauma. Denies fever, denies chills

## 2021-11-22 NOTE — ED Notes (Signed)
Patient upset over wait time. Explained acuity versus wait time. Patient states they are leaving

## 2021-11-23 ENCOUNTER — Ambulatory Visit (INDEPENDENT_AMBULATORY_CARE_PROVIDER_SITE_OTHER): Payer: Medicare PPO | Admitting: Family

## 2021-11-23 ENCOUNTER — Ambulatory Visit: Payer: Medicare PPO | Admitting: Physician Assistant

## 2021-11-23 ENCOUNTER — Encounter: Payer: Self-pay | Admitting: Family

## 2021-11-23 ENCOUNTER — Ambulatory Visit: Payer: Medicare PPO | Admitting: Gastroenterology

## 2021-11-23 VITALS — BP 170/80 | HR 70 | Temp 98.0°F | Ht 61.5 in | Wt 221.2 lb

## 2021-11-23 DIAGNOSIS — M542 Cervicalgia: Secondary | ICD-10-CM

## 2021-11-23 DIAGNOSIS — E1159 Type 2 diabetes mellitus with other circulatory complications: Secondary | ICD-10-CM

## 2021-11-23 DIAGNOSIS — I152 Hypertension secondary to endocrine disorders: Secondary | ICD-10-CM

## 2021-11-23 NOTE — Patient Instructions (Addendum)
It was very nice to see you today!   You can take 1/2 pill of your Flexeril during the day twice and a full pill at night for the next week to help your neck pain.   For your blood pressure, increase the Lisinopril to '10mg'$  daily. Take an extra pill today when you get home. Follow up with Dr. Cherlynn Kaiser  regarding your blood pressure, as may go down as your pain resolves.  Be sure you drinking at least 2 liters of water daily.      PLEASE NOTE:  If you had any lab tests please let us know if you have not heard back within a few days. You may see your results on MyChart before we have a chance to review them but we will give you a call once they are reviewed by [place]. If we ordered any referrals today, please let us know if you have not heard from their office within the next week.

## 2021-11-23 NOTE — Progress Notes (Signed)
Patient ID: Brittany Acosta, female    DOB: 06/16/1950, 71 y.o.   MRN: [id number]  Chief Complaint  Patient presents with   Neck Pain    Pt c/o sharp constant pain in right side of her neck and radiates down her right shoulder, Noticed 2 weeks ago. Pt states she had a knot in her neck, but went down before she could get an appointment with her ENT. Has tried acetaminophen, which did not help with the pain.  Husband states her neck was swollen last night. Pt states it hurts to turn her neck that way.     HPI: Right neck pain:  reported feeling a knot on her right side of neck near subclavian, had called ENT who removed left side of thyroid, but pt was better by then, but then reports pain restarted 3 days ago.  Pt has pain with  forward flexion and left lateral movement, and twisting head to left, she feels the pain in base of skull area on right side and radiates to right shoulder and down upper arm, reports having shoulder surgery in 02/2021.   Assessment & Plan:  1. Hypertension associated with diabetes ([place]) BP high today, possibly due to having increased neck pain. Advised to take an extra Lisinopril = '10mg'$  today and daily until she sees PCP again. Advised on drinking 2L of water qd, eating low sodium diet.  2. Cervicalgia New - possibly pinched nerve per pt description of pain. Advised to take 1/2 of her Flexeril 1-2x/day and take 1 full pill qhs for the next week. Try OTC Lidocaine cream or patch bid, can try heat and/or ice for 80mn tid prn. F/U with PCP if pain not resolving.    Subjective:    Outpatient Medications Prior to Visit  Medication Sig Dispense Refill   allopurinol (ZYLOPRIM) 300 MG tablet TAKE 1 TABLET EVERY DAY 90 tablet 0   anastrozole (ARIMIDEX) 1 MG tablet TAKE 1 TABLET EVERY DAY 90 tablet 0   aspirin EC 81 MG tablet Take 1 tablet (81 mg total) by mouth daily. Swallow whole. 90 tablet 3   Biotin 10000 MCG TABS Take 1 tablet by mouth daily in the afternoon.      Cholecalciferol (VITAMIN D3) 125 MCG (5000 UT) CAPS Take 1 capsule by mouth daily in the afternoon.     cyclobenzaprine (FLEXERIL) 10 MG tablet Take 1 tablet (10 mg total) by mouth at bedtime as needed for muscle spasms. 90 tablet 1   doxepin (SINEQUAN) 10 MG capsule TAKE 1 CAPSULE EVERY DAY 90 capsule 0   ELDERBERRY PO Take 5,000 capsules by mouth daily in the afternoon.     fluticasone (FLONASE) 50 MCG/ACT nasal spray USE 2 SPRAYS IN EACH NOSTRIL EVERY DAY 48 g 1   folic acid (FOLVITE) 1 MG tablet Take 2 mg by mouth daily.     furosemide (LASIX) 40 MG tablet Take 1 tablet (40 mg total) by mouth daily. 90 tablet 3   gabapentin (NEURONTIN) 300 MG capsule Take 300 mg by mouth 3 (three) times daily.     Ginkgo Biloba (GINKOBA PO) Take 3,000 capsules by mouth daily in the afternoon.     glucose blood (TRUE METRIX BLOOD GLUCOSE TEST) test strip TEST BLOOD SUGAR EVERY DAY AND AS NEEDED 200 strip 4   Insulin Pen Needle (DROPLET PEN NEEDLES) 31G X 5 MM MISC USE TO INJECT INSULIN DAILY 100 each 3   lamoTRIgine (LAMICTAL) 200 MG tablet Take 1 tablet (200 mg  total) by mouth daily at 12 noon. 90 tablet 3   levothyroxine (SYNTHROID) 50 MCG tablet TAKE 1 TABLET EVERY DAY (OFFICE VISIT NEEDED FOR FURTHER REFILLS) 90 tablet 1   lisinopril (ZESTRIL) 5 MG tablet TAKE 1 TABLET EVERY DAY 90 tablet 1   loratadine (CLARITIN) 10 MG tablet 1 tablet     metFORMIN (GLUCOPHAGE) 500 MG tablet TAKE 2 TABLETS ('1000MG'$ ) EVERY MORNING AND TAKE 1 TABLET ('500MG'$ ) EVERY EVENING 270 tablet 1   methotrexate 2.5 MG tablet Take 15 mg by mouth every Saturday.     omeprazole (PRILOSEC) 20 MG capsule Take 1 capsule (20 mg total) by mouth daily. Take 30 minutes before a meal 90 capsule 3   oxybutynin (DITROPAN-XL) 10 MG 24 hr tablet TAKE 1 TABLET (10 MG TOTAL) BY MOUTH AT BEDTIME. 90 tablet 0   potassium citrate (UROCIT-K) 10 MEQ (1080 MG) SR tablet Take 1 tablet (10 mEq total) by mouth daily in the afternoon. 90 tablet 1   pravastatin  (PRAVACHOL) 20 MG tablet TAKE 1 TABLET (20 MG TOTAL) BY MOUTH AT BEDTIME. 90 tablet 0   predniSONE (DELTASONE) 5 MG tablet Take 5 mg by mouth daily with breakfast.      sertraline (ZOLOFT) 100 MG tablet TAKE 1 TABLET EVERY DAY 90 tablet 0   sulfaSALAzine (AZULFIDINE) 500 MG tablet Take 2 tablets by mouth 2 (two) times daily.     tamsulosin (FLOMAX) 0.4 MG CAPS capsule TAKE 1 CAPSULE EVERY DAY 90 capsule 1   TRESIBA FLEXTOUCH 100 UNIT/ML FlexTouch Pen INJECT 26 UNITS SUBCUTANEOUSLY DAILY 30 mL 1   No facility-administered medications prior to visit.   Past Medical History:  Diagnosis Date   Allergic rhinitis, cause unspecified    Arthritis    Bipolar disorder, unspecified ([place])    depression   Cancer ([place])    skin cancer on left leg, left breast cancer   Chronic diastolic heart failure (HCC)    Depression    GERD (gastroesophageal reflux disease)    Headache    History of gout    "haven't had it in several years" (02/08/2012)   History of kidney stones    History of wrist fracture    rt wrist   Hypertension    Iron deficiency anemia    Kidney stones    sees urologist @ [facility]   Morbid obesity ([place])    Neuropathy due to secondary diabetes ([place])    Nontoxic multinodular goiter    Osteoporosis    Personal history of radiation therapy    Pneumonia    PONV (postoperative nausea and vomiting)    PTSD (post-traumatic stress disorder)    Pure hypercholesterolemia    RBBB    Scleroderma ([place])    Sleep apnea 10/2012   mild osa-did not need cpap -dr clance   Type II diabetes mellitus ([place])    Unspecified hypothyroidism    Past Surgical History:  Procedure Laterality Date   ABDOMINAL HYSTERECTOMY  1976   BREAST EXCISIONAL BIOPSY Left    BREAST LUMPECTOMY Left 04/25/2018   BREAST LUMPECTOMY WITH RADIOACTIVE SEED AND SENTINEL LYMPH NODE BIOPSY Left 04/25/2018   Procedure: LEFT BREAST LUMPECTOMY WITH RADIOACTIVE SEED AND SENTINEL LYMPH NODE BIOPSY;  Surgeon: Excell Seltzer, MD;   Location: [facility];  Service: General;  Laterality: Left;   [place]  2001   sees Dr Peter Martinique   CATARACT EXTRACTION  2014   CATARACT EXTRACTION, BILATERAL  02/2011   epps  CHOLECYSTECTOMY  1985   EXCISIONAL HEMORRHOIDECTOMY     "dr cut out in his office" (02/08/2012)   INCISIONAL BREAST BIOPSY  2000   right   JOINT REPLACEMENT     rt knee   KNEE ARTHROSCOPY  08/2009   right   KNEE ARTHROSCOPY WITH MEDIAL MENISECTOMY     left   Left Cystoscopy   1990   LEFT OOPHORECTOMY  1980   Lithotripsy (L) Kidney  1997   LUMBAR LAMINECTOMY/DECOMPRESSION MICRODISCECTOMY Left 07/01/2016   Procedure: Laminectomy for synovial cyst - left - Lumbar four-five;  Surgeon: Earnie Larsson, MD;  Location: [facility];  Service: Neurosurgery;  Laterality: Left;   PARTIAL MASTECTOMY WITH NEEDLE LOCALIZATION Left 09/01/2012   Procedure: PARTIAL MASTECTOMY WITH NEEDLE LOCALIZATION;  Surgeon: Adin Hector, MD;  Location: [facility];  Service: General;  Laterality: Left;   [place]erc[place] stone removed (L) Kidney  1992   REVERSE SHOULDER ARTHROPLASTY Right 02/12/2021   Procedure: REVERSE SHOULDER ARTHROPLASTY;  Surgeon: Tania Ade, MD;  [facility];  Service: Orthopedics;  Laterality: Right;   Right nasal surgery  08/1988   Right sinus removed  08/1989   tooth partial   ROTATOR CUFF REPAIR  2013   right shoulder x 2   SHOULDER ARTHROSCOPY WITH ROTATOR CUFF REPAIR AND SUBACROMIAL DECOMPRESSION Left 08/20/2013   Procedure: SHOULDER ARTHROSCOPY  AND SUBACROMIAL DECOMPRESSION;  Surgeon: Nita Sells, MD;  Location: [place];  Service: Orthopedics;  Laterality: Left;  Left shoulder arthroscopy, debridement, subacromial decompression, distal clavical resection   THYROIDECTOMY  04/22/2011   Procedure: THYROIDECTOMY;  Surgeon: Onnie Graham, MD;  Location: [facility];  Service: ENT;  Laterality: N/A;  TOTAL THYROIDECOTMY   TOTAL KNEE ARTHROPLASTY  06/18/2011    Procedure: TOTAL KNEE ARTHROPLASTY;  Surgeon: Kerin Salen, MD;  Location: [facility];  Service: Orthopedics;  Laterality: Left;  DEPUY   TOTAL KNEE ARTHROPLASTY  02/07/2012   Procedure: TOTAL KNEE ARTHROPLASTY;  Surgeon: Kerin Salen, MD;  Location: [facility];  Service: Orthopedics;  Laterality: Right;   TUBAL LIGATION  1972   No Known Allergies    Objective:    Physical Exam Vitals and nursing note reviewed.  Constitutional:      Appearance: Normal appearance.  Cardiovascular:     Rate and Rhythm: Normal rate and regular rhythm.  Pulmonary:     Effort: Pulmonary effort is normal.     Breath sounds: Normal breath sounds.  Musculoskeletal:     Cervical back: No edema or signs of trauma. Pain with movement and muscular tenderness present. Decreased range of motion.  Lymphadenopathy:     Cervical: No cervical adenopathy.  Skin:    General: Skin is warm and dry.  Neurological:     Mental Status: She is alert.  Psychiatric:        Mood and Affect: Mood normal.        Behavior: Behavior normal.    BP (!) 171/78 (BP Location: Left Arm, Patient Position: Sitting, Cuff Size: Large)   Pulse 70   Temp 98 F (36.7 C) (Temporal)   Ht 5' 1.5" (1.562 m)   Wt 221 lb 4 oz (100.4 kg)   SpO2 96%   BMI 41.13 kg/m  Wt Readings from Last 3 Encounters:  11/23/21 221 lb 4 oz (100.4 kg)  10/30/21 223 lb 8 oz (101.4 kg)  09/18/21 230 lb 6 oz (104.5 kg)       Jeanie Sewer, NP

## 2021-11-26 ENCOUNTER — Ambulatory Visit: Payer: Medicare PPO | Admitting: Podiatry

## 2021-11-26 ENCOUNTER — Encounter: Payer: Self-pay | Admitting: Podiatry

## 2021-11-26 DIAGNOSIS — M47816 Spondylosis without myelopathy or radiculopathy, lumbar region: Secondary | ICD-10-CM | POA: Diagnosis not present

## 2021-11-26 DIAGNOSIS — E1142 Type 2 diabetes mellitus with diabetic polyneuropathy: Secondary | ICD-10-CM

## 2021-11-26 DIAGNOSIS — M778 Other enthesopathies, not elsewhere classified: Secondary | ICD-10-CM | POA: Diagnosis not present

## 2021-11-26 DIAGNOSIS — M2041 Other hammer toe(s) (acquired), right foot: Secondary | ICD-10-CM | POA: Diagnosis not present

## 2021-11-26 DIAGNOSIS — M2042 Other hammer toe(s) (acquired), left foot: Secondary | ICD-10-CM

## 2021-11-26 NOTE — Progress Notes (Signed)
She presents today and states that the capsulitis was doing better for quite some time she said the shot helped for a while but I am still experiencing pain particularly right around the bump that she refers to the bump on the dorsal aspect at the first metatarsal cuneiform joint.  She has tenderness in this area with shoes she says.  Objective: Pulses are strong and palpable bilaterally she has a dorsal exostosis at the tarsometatarsal joint dorsally first with the tenderness along the deep peroneal nerve.  Assessment: Capsulitis neuritis right foot.  Plan: Placed padding today and discussed the need for surgical intervention she declined we will follow-up with her on an as-needed basis.

## 2021-12-01 NOTE — Addendum Note (Signed)
Addended by: Rip Harbour on: 12/01/2021 04:48 PM   Modules accepted: Orders

## 2021-12-10 ENCOUNTER — Other Ambulatory Visit: Payer: Self-pay | Admitting: Podiatry

## 2021-12-10 DIAGNOSIS — M2041 Other hammer toe(s) (acquired), right foot: Secondary | ICD-10-CM

## 2021-12-10 DIAGNOSIS — E1142 Type 2 diabetes mellitus with diabetic polyneuropathy: Secondary | ICD-10-CM

## 2021-12-10 DIAGNOSIS — M19079 Primary osteoarthritis, unspecified ankle and foot: Secondary | ICD-10-CM

## 2021-12-14 DIAGNOSIS — M9908 Segmental and somatic dysfunction of rib cage: Secondary | ICD-10-CM | POA: Diagnosis not present

## 2021-12-14 DIAGNOSIS — M9907 Segmental and somatic dysfunction of upper extremity: Secondary | ICD-10-CM | POA: Diagnosis not present

## 2021-12-14 DIAGNOSIS — M9901 Segmental and somatic dysfunction of cervical region: Secondary | ICD-10-CM | POA: Diagnosis not present

## 2021-12-14 DIAGNOSIS — M542 Cervicalgia: Secondary | ICD-10-CM | POA: Diagnosis not present

## 2021-12-15 ENCOUNTER — Telehealth: Payer: Self-pay | Admitting: Pharmacist

## 2021-12-15 NOTE — Progress Notes (Signed)
Chronic Care Management Pharmacy Assistant   Name: Brittany Acosta  MRN: [id number] DOB: [birth year]   Reason for Encounter: Diabetes Adherence Call    Recent office visits:  11/23/2021 OV  (Fam Med) Jeanie Sewer, NP; BP high today, possibly due to having increased neck pain. Advised to take an extra Lisinopril = '10mg'$  today and daily until she sees PCP again.  10/30/2021 OV (PCP) Tawnya Crook, MD; no medication changes noted.  Recent consult visits:  11/26/2021 OV (Podiatry) [place], [place], [place]; no medication changes indicated.  11/13/2021 OV (Otolaryngology) Hessie Knows, MD; no medication changes noted.  Hospital visits:  None  Medications: Outpatient Encounter Medications as of 12/15/2021  Medication Sig   allopurinol (ZYLOPRIM) 300 MG tablet TAKE 1 TABLET EVERY DAY   anastrozole (ARIMIDEX) 1 MG tablet TAKE 1 TABLET EVERY DAY   aspirin EC 81 MG tablet Take 1 tablet (81 mg total) by mouth daily. Swallow whole.   Biotin 10000 MCG TABS Take 1 tablet by mouth daily in the afternoon.   Cholecalciferol (VITAMIN D3) 125 MCG (5000 UT) CAPS Take 1 capsule by mouth daily in the afternoon.   cyclobenzaprine (FLEXERIL) 10 MG tablet Take 1 tablet (10 mg total) by mouth at bedtime as needed for muscle spasms.   doxepin (SINEQUAN) 10 MG capsule TAKE 1 CAPSULE EVERY DAY   ELDERBERRY PO Take 5,000 capsules by mouth daily in the afternoon.   fluticasone (FLONASE) 50 MCG/ACT nasal spray USE 2 SPRAYS IN EACH NOSTRIL EVERY DAY   folic acid (FOLVITE) 1 MG tablet Take 2 mg by mouth daily.   furosemide (LASIX) 40 MG tablet Take 1 tablet (40 mg total) by mouth daily.   gabapentin (NEURONTIN) 300 MG capsule Take 300 mg by mouth 3 (three) times daily.   Ginkgo Biloba (GINKOBA PO) Take 3,000 capsules by mouth daily in the afternoon.   glucose blood (TRUE METRIX BLOOD GLUCOSE TEST) test strip TEST BLOOD SUGAR EVERY DAY AND AS NEEDED   Insulin Pen Needle (DROPLET PEN NEEDLES) 31G X 5 MM MISC  USE TO INJECT INSULIN DAILY   lamoTRIgine (LAMICTAL) 200 MG tablet Take 1 tablet (200 mg total) by mouth daily at 12 noon.   levothyroxine (SYNTHROID) 50 MCG tablet TAKE 1 TABLET EVERY DAY (OFFICE VISIT NEEDED FOR FURTHER REFILLS)   lisinopril (ZESTRIL) 5 MG tablet TAKE 1 TABLET EVERY DAY   loratadine (CLARITIN) 10 MG tablet 1 tablet   metFORMIN (GLUCOPHAGE) 500 MG tablet TAKE 2 TABLETS ('1000MG'$ ) EVERY MORNING AND TAKE 1 TABLET ('500MG'$ ) EVERY EVENING   methotrexate 2.5 MG tablet Take 15 mg by mouth every Saturday.   omeprazole (PRILOSEC) 20 MG capsule Take 1 capsule (20 mg total) by mouth daily. Take 30 minutes before a meal   oxybutynin (DITROPAN-XL) 10 MG 24 hr tablet TAKE 1 TABLET (10 MG TOTAL) BY MOUTH AT BEDTIME.   potassium citrate (UROCIT-K) 10 MEQ (1080 MG) SR tablet Take 1 tablet (10 mEq total) by mouth daily in the afternoon.   pravastatin (PRAVACHOL) 20 MG tablet TAKE 1 TABLET (20 MG TOTAL) BY MOUTH AT BEDTIME.   predniSONE (DELTASONE) 5 MG tablet Take 5 mg by mouth daily with breakfast.    sertraline (ZOLOFT) 100 MG tablet TAKE 1 TABLET EVERY DAY   sulfaSALAzine (AZULFIDINE) 500 MG tablet Take 2 tablets by mouth 2 (two) times daily.   tamsulosin (FLOMAX) 0.4 MG CAPS capsule TAKE 1 CAPSULE EVERY DAY   TRESIBA FLEXTOUCH 100 UNIT/ML FlexTouch Pen INJECT 26  UNITS SUBCUTANEOUSLY DAILY   No facility-administered encounter medications on file as of 12/15/2021.   Recent Relevant Labs: Lab Results  Component Value Date/Time   HGBA1C 6.7 (H) 09/01/2021 10:46 AM   HGBA1C 6.7 (H) 01/29/2021 08:51 AM   MICROALBUR 1.4 08/28/2021 10:59 AM   MICROALBUR <0.7 06/26/2015 12:13 PM    Kidney Function Lab Results  Component Value Date/Time   CREATININE 0.60 11/22/2021 02:26 PM   CREATININE 0.63 08/28/2021 10:59 AM   GFR 89.48 08/28/2021 10:59 AM   GFRNONAA >60 11/22/2021 02:26 PM   GFRAA >60 09/14/2019 02:22 PM    Current antihyperglycemic regimen:  Metformin 500 mg two tablets in the  morning and one in the evening Tresiba 23 units  What recent interventions/DTPs have been made to improve glycemic control:  No recent interventions or DTPs for glycemic control.  Have there been any recent hospitalizations or ED visits since last visit with CPP? No  Patient denies hypoglycemic symptoms.  Patient denies hyperglycemic symptoms.  How often are you checking your blood sugar? once daily  What are your blood sugars ranging?  Fasting: 98-125  During the week, how often does your blood glucose drop below 70? Never  Are you checking your feet daily/regularly? Yes  Adherence Review: Is the patient currently on a STATIN medication? Yes Is the patient currently on ACE/ARB medication? Yes Does the patient have >5 day gap between last estimated fill dates? No  -Patient states she did not start taking an extra dose of Lisinopril  once a day as advised. She states her blood pressure has been good. She reports blood pressure at 128/68 on 12/14/2021.  Care Gaps: Medicare Annual Wellness: Completed 09/26/2020 Ophthalmology Exam: Next due on 06/04/2022 Hemoglobin A1C: 6.7% on 09/01/2021 Fecal DNA (Cologuard): Next due on 10/01/2022 Dexa Scan: Completed Mammogram: Next due on 06/04/2023  Future Appointments  Date Time Provider [place]  12/28/2021  2:30 PM Tawnya Crook, MD [facility]  12/29/2021 10:30 AM Daryel November, MD [facility]  03/23/2022  3:30 PM [facility] PHARMACIST [facility]   Star Rating Drugs: Lisinopril 5 mg last filled 11/13/2021 90 DS Metformin 500 mg last filled 10/21/2021 90 DS Pravastatin 20 mg last filled 11/16/2021 90 DS  April D Calhoun, [place] Pharmacist Assistant [phone]

## 2021-12-28 ENCOUNTER — Other Ambulatory Visit: Payer: Self-pay | Admitting: Sports Medicine

## 2021-12-28 ENCOUNTER — Ambulatory Visit: Payer: Medicare PPO | Admitting: Family Medicine

## 2021-12-28 ENCOUNTER — Ambulatory Visit
Admission: RE | Admit: 2021-12-28 | Discharge: 2021-12-28 | Disposition: A | Discharge status: Home / Self Care | Payer: Medicare PPO | Source: Ambulatory Visit | Attending: Sports Medicine | Admitting: Sports Medicine

## 2021-12-28 ENCOUNTER — Encounter: Payer: Self-pay | Admitting: *Deleted

## 2021-12-28 DIAGNOSIS — R296 Repeated falls: Secondary | ICD-10-CM | POA: Diagnosis not present

## 2021-12-28 DIAGNOSIS — M542 Cervicalgia: Secondary | ICD-10-CM | POA: Diagnosis not present

## 2021-12-28 DIAGNOSIS — M50323 Other cervical disc degeneration at C6-C7 level: Secondary | ICD-10-CM | POA: Diagnosis not present

## 2021-12-28 DIAGNOSIS — M25511 Pain in right shoulder: Secondary | ICD-10-CM

## 2021-12-28 DIAGNOSIS — M9907 Segmental and somatic dysfunction of upper extremity: Secondary | ICD-10-CM | POA: Diagnosis not present

## 2021-12-28 DIAGNOSIS — M9901 Segmental and somatic dysfunction of cervical region: Secondary | ICD-10-CM | POA: Diagnosis not present

## 2021-12-28 DIAGNOSIS — R1310 Dysphagia, unspecified: Secondary | ICD-10-CM | POA: Diagnosis not present

## 2021-12-28 DIAGNOSIS — M50322 Other cervical disc degeneration at C5-C6 level: Secondary | ICD-10-CM | POA: Diagnosis not present

## 2021-12-28 DIAGNOSIS — M9908 Segmental and somatic dysfunction of rib cage: Secondary | ICD-10-CM | POA: Diagnosis not present

## 2021-12-29 ENCOUNTER — Ambulatory Visit (INDEPENDENT_AMBULATORY_CARE_PROVIDER_SITE_OTHER): Payer: Medicare PPO | Admitting: Gastroenterology

## 2021-12-29 ENCOUNTER — Encounter: Payer: Self-pay | Admitting: Gastroenterology

## 2021-12-29 VITALS — BP 140/80 | HR 76 | Ht 61.0 in | Wt 221.6 lb

## 2021-12-29 DIAGNOSIS — R195 Other fecal abnormalities: Secondary | ICD-10-CM | POA: Diagnosis not present

## 2021-12-29 DIAGNOSIS — D509 Iron deficiency anemia, unspecified: Secondary | ICD-10-CM

## 2021-12-29 DIAGNOSIS — K219 Gastro-esophageal reflux disease without esophagitis: Secondary | ICD-10-CM

## 2021-12-29 MED ORDER — NA SULFATE-K SULFATE-MG SULF 17.5-3.13-1.6 GM/177ML PO SOLN: 1.0000 | Freq: Once | ORAL | 0 refills | Status: AC

## 2021-12-29 NOTE — Patient Instructions (Signed)
If you are age 71 or older, your body mass index should be between 23-30. Your Body mass index is 41.87 kg/m. If this is out of the aforementioned range listed, please consider follow up with your Primary Care Provider.  If you are age 2 or younger, your body mass index should be between 19-25. Your Body mass index is 41.87 kg/m. If this is out of the aformentioned range listed, please consider follow up with your Primary Care Provider.   You have been scheduled for an endoscopy and colonoscopy. Please follow the written instructions given to you at your visit today. Please pick up your prep supplies at the pharmacy within the next 1-3 days. If you use inhalers (even only as needed), please bring them with you on the day of your procedure.   The [facility] providers would like to encourage you to use [facility] to communicate with providers for non-urgent requests or questions.  Due to long hold times on the telephone, sending your provider a message by [facility] may be a faster and more efficient way to get a response.  Please allow 48 business hours for a response.  Please remember that this is for non-urgent requests.   It was a pleasure to see you today!  Thank you for trusting me with your gastrointestinal care!    Scott E.Candis Schatz, MD

## 2021-12-29 NOTE — Progress Notes (Unsigned)
HPI : Brittany Acosta is a pleasant 71 year old female with diabetes, obesity, bipolar disorder who is referred to [place] by Dr. Esther Hardy for positive fecal occult blood test and anemia.She has had a stable mild normocytic anemia since 2021, with hemoglobin around 11.  An iron panel in May of this year showed a low iron saturation at 9.4%, ferritin of 18.4 and serum iron 31.  She had negative fecal occult blood test in 2020 and a negative Cologuard in 2021.  She had a colonoscopy in 2006 by Dr. Collene Mares which was reportedly normal.  She had a colonoscopy prior to that and thinks she had polyps.  She has never had an upper endoscopy. She denies any overt GI bleeding such as hematochezia or melena.  She denies any chronic lower GI symptoms such as abdominal pain, constipation or diarrhea.  She does report chronic typical GERD symptoms which are fairly well controlled with once daily omeprazole. She denies any concerning cardiopulmonary symptoms such as chest pain/pressure, shortness of breath, lightheadedness/dizziness, palpitations. She was seen by cardiology in November of last year for preop visit for shoulder surgery.  She underwent a coronary CTA as part of this evaluation which showed no significant coronary calcification.  She had an echocardiogram in November 2021 with normal ejection fraction.  An EKG from June of this year showed sinus rhythm with PACs, and a right bundle branch block.  Her physical activity level is very limited due to chronic knee and back pain.   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[place])    Nontoxic multinodular goiter    Osteoporosis    Personal history of radiation therapy    Pneumonia    PONV (postoperative nausea and vomiting)    PTSD (post-traumatic stress disorder)    Pure hypercholesterolemia    RBBB    Scleroderma ([place])    Sleep apnea 10/2012   mild osa-did not need cpap -dr clance   Type II diabetes mellitus ([place])    Unspecified hypothyroidism      Past Surgical History:  Procedure Laterality Date   ABDOMINAL HYSTERECTOMY  1976   BREAST EXCISIONAL BIOPSY Left    BREAST LUMPECTOMY Left 04/25/2018   BREAST LUMPECTOMY WITH RADIOACTIVE SEED AND SENTINEL LYMPH NODE BIOPSY Left 04/25/2018   Procedure: LEFT BREAST LUMPECTOMY WITH RADIOACTIVE SEED AND SENTINEL LYMPH NODE BIOPSY;  Surgeon: Excell Seltzer, MD;  Location: [place];  Service: General;  Laterality: Left;   [place]  2001   sees Dr Peter Martinique   CATARACT EXTRACTION  2014   CATARACT EXTRACTION, BILATERAL  02/2011   epps   CHOLECYSTECTOMY  1985   EXCISIONAL HEMORRHOIDECTOMY     "dr cut out in his office" (02/08/2012)  INCISIONAL BREAST BIOPSY  2000   right   JOINT REPLACEMENT     rt knee   KNEE ARTHROSCOPY  08/2009   right   KNEE ARTHROSCOPY WITH MEDIAL MENISECTOMY     left   Left Cystoscopy   1990   LEFT OOPHORECTOMY  1980   Lithotripsy (L) Kidney  1997   LUMBAR LAMINECTOMY/DECOMPRESSION MICRODISCECTOMY Left 07/01/2016   Procedure: Laminectomy for synovial cyst - left - Lumbar four-five;  Surgeon: Earnie Larsson, MD;  Location: [place];  Service: Neurosurgery;  Laterality: Left;   PARTIAL MASTECTOMY WITH NEEDLE LOCALIZATION Left 09/01/2012   Procedure: PARTIAL MASTECTOMY WITH NEEDLE LOCALIZATION;  Surgeon: Adin Hector, MD;  Location: [place];  Service: General;  Laterality: Left;    [place] stone removed (L) Kidney  1992   REVERSE SHOULDER ARTHROPLASTY Right 02/12/2021   Procedure: REVERSE SHOULDER ARTHROPLASTY;  Surgeon: Tania Ade, MD;  Location: [facility];  Service: Orthopedics;  Laterality: Right;   Right nasal surgery  08/1988   Right sinus removed  08/1989   tooth partial   ROTATOR CUFF REPAIR  2013   right shoulder x 2   SHOULDER ARTHROSCOPY WITH ROTATOR CUFF REPAIR AND SUBACROMIAL DECOMPRESSION Left 08/20/2013   Procedure: SHOULDER ARTHROSCOPY  AND SUBACROMIAL DECOMPRESSION;  Surgeon: Nita Sells, MD;  Location: [place];  Service: Orthopedics;  Laterality: Left;  Left shoulder arthroscopy, debridement, subacromial decompression, distal clavical resection   THYROIDECTOMY  04/22/2011   Procedure: THYROIDECTOMY;  Surgeon: Onnie Graham, MD;  Location: [place];  Service: ENT;  Laterality: N/A;  TOTAL THYROIDECOTMY   TOTAL KNEE ARTHROPLASTY  06/18/2011   Procedure: TOTAL KNEE ARTHROPLASTY;  Surgeon: Kerin Salen, MD;  Location: [place];  Service: Orthopedics;  Laterality: Left;  DEPUY   TOTAL KNEE ARTHROPLASTY  02/07/2012   Procedure: TOTAL KNEE ARTHROPLASTY;  Surgeon: Kerin Salen, MD;  Location: [place];  Service: Orthopedics;  Laterality: Right;   TUBAL LIGATION  1972   Family History  Problem Relation Age of Onset   Hypertension Mother    Lung cancer Mother        non-smoker   Diabetes Father    Hypertension Father    Hyperlipidemia Father    Heart attack Other    Coronary artery disease Other    Hypertension Sister    Hypertension Brother    Hypertension Brother    Hypertension Brother    Hypertension Sister    Hypertension Sister    Diabetes Daughter    Social History   Tobacco Use   Smoking status: Former    Packs/day: 1.00    Years: 6.00    Total pack years: 6.00    Types: Cigarettes    Quit date: 04/06/1983    Years since quitting: 38.7   Smokeless tobacco: Never   Tobacco comments:    Married, lives with  spouse. Disable- 2 grown kids-6 g-kids  Vaping Use   Vaping Use: Never used  Substance Use Topics   Alcohol use: No    Comment: none   Drug use: No   Current Outpatient Medications  Medication Sig Dispense Refill   allopurinol (ZYLOPRIM) 300 MG tablet TAKE 1 TABLET EVERY DAY 90 tablet 0   anastrozole (ARIMIDEX) 1 MG tablet TAKE 1 TABLET EVERY DAY 90 tablet 0   aspirin EC 81 MG tablet Take 1 tablet (81 mg total) by mouth daily. Swallow whole. 90 tablet 3   Biotin 10000 MCG TABS Take 1 tablet by mouth  daily in the afternoon.     Cholecalciferol (VITAMIN D3) 125 MCG (5000 UT) CAPS Take 1 capsule by mouth daily in the afternoon.     Chromium-Cinnamon (CINNAMON PLUS CHROMIUM PO) Take 2 tablets by mouth daily.     cyclobenzaprine (FLEXERIL) 10 MG tablet Take 1 tablet (10 mg total) by mouth at bedtime as needed for muscle spasms. 90 tablet 1   doxepin (SINEQUAN) 10 MG capsule TAKE 1 CAPSULE EVERY DAY 90 capsule 0   ELDERBERRY PO Take 5,000 capsules by mouth daily in the afternoon.     fluticasone (FLONASE) 50 MCG/ACT nasal spray USE 2 SPRAYS IN EACH NOSTRIL EVERY DAY 48 g 1   folic acid (FOLVITE) 1 MG tablet Take 2 mg by mouth daily.     furosemide (LASIX) 40 MG tablet Take 1 tablet (40 mg total) by mouth daily. 90 tablet 3   gabapentin (NEURONTIN) 300 MG capsule Take 300 mg by mouth 3 (three) times daily.     Ginkgo Biloba (GINKOBA PO) Take 3,000 capsules by mouth daily in the afternoon.     glucose blood (TRUE METRIX BLOOD GLUCOSE TEST) test strip TEST BLOOD SUGAR EVERY DAY AND AS NEEDED 200 strip 4   Insulin Pen Needle (DROPLET PEN NEEDLES) 31G X 5 MM MISC USE TO INJECT INSULIN DAILY 100 each 3   lamoTRIgine (LAMICTAL) 200 MG tablet Take 1 tablet (200 mg total) by mouth daily at 12 noon. 90 tablet 3   levothyroxine (SYNTHROID) 50 MCG tablet TAKE 1 TABLET EVERY DAY (OFFICE VISIT NEEDED FOR FURTHER REFILLS) 90 tablet 1   lisinopril (ZESTRIL) 5 MG tablet TAKE 1 TABLET EVERY DAY 90 tablet 1    loratadine (CLARITIN) 10 MG tablet 1 tablet     metFORMIN (GLUCOPHAGE) 500 MG tablet TAKE 2 TABLETS ('1000MG'$ ) EVERY MORNING AND TAKE 1 TABLET ('500MG'$ ) EVERY EVENING 270 tablet 1   methotrexate 2.5 MG tablet Take 15 mg by mouth every Saturday.     Multiple Vitamins-Minerals (EYE VITAMINS PO) Take 2 tablets by mouth daily.     omeprazole (PRILOSEC) 20 MG capsule Take 1 capsule (20 mg total) by mouth daily. Take 30 minutes before a meal 90 capsule 3   oxybutynin (DITROPAN-XL) 10 MG 24 hr tablet TAKE 1 TABLET (10 MG TOTAL) BY MOUTH AT BEDTIME. 90 tablet 0   potassium citrate (UROCIT-K) 10 MEQ (1080 MG) SR tablet Take 1 tablet (10 mEq total) by mouth daily in the afternoon. 90 tablet 1   pravastatin (PRAVACHOL) 20 MG tablet TAKE 1 TABLET (20 MG TOTAL) BY MOUTH AT BEDTIME. 90 tablet 0   predniSONE (DELTASONE) 5 MG tablet Take 5 mg by mouth daily with breakfast.      sertraline (ZOLOFT) 100 MG tablet TAKE 1 TABLET EVERY DAY 90 tablet 0   sulfaSALAzine (AZULFIDINE) 500 MG tablet Take 2 tablets by mouth 2 (two) times daily.     tamsulosin (FLOMAX) 0.4 MG CAPS capsule TAKE 1 CAPSULE EVERY DAY 90 capsule 1   TRESIBA FLEXTOUCH 100 UNIT/ML FlexTouch Pen INJECT 26 UNITS SUBCUTANEOUSLY DAILY 30 mL 1   No current facility-administered medications for this visit.   No Known Allergies   Review of Systems: All systems reviewed and negative except where noted in HPI.    No results found.  Physical Exam: BP (!) 140/80 (BP Location: Left Arm, Patient Position: Sitting, Cuff Size: Normal)   Pulse 76   Ht '5\' 1"'$  (1.549 m)   Wt 221 lb 9.6 oz (100.5 kg)  SpO2 96%   BMI 41.87 kg/m  Constitutional: Pleasant,well-developed, Caucasian female in no acute distress.  Accompanied by husband HEENT: Normocephalic and atraumatic. Conjunctivae are normal. No scleral icterus. Neck supple.  Cardiovascular: Normal rate, regular rhythm with frequent ectopy.  Pulmonary/chest: Effort normal and breath sounds normal. No  wheezing, rales or rhonchi. Abdominal: Soft, nondistended, diffusely tender to light palpation throughout the abdomen, no rigidity or guarding. Bowel sounds active throughout. There are no masses palpable. No hepatomegaly. Extremities: no edema Neurological: Alert and oriented to person place and time. Skin: Skin is warm and dry. No rashes noted. Psychiatric: Normal mood and affect. Behavior is normal.  CBC    Component Value Date/Time   WBC 9.8 11/22/2021 1426   RBC 3.88 11/22/2021 1426   HGB 10.8 (L) 11/22/2021 1426   HCT 33.2 (L) 11/22/2021 1426   PLT 435 (H) 11/22/2021 1426   MCV 85.6 11/22/2021 1426   MCH 27.8 11/22/2021 1426   MCHC 32.5 11/22/2021 1426   RDW 16.6 (H) 11/22/2021 1426   LYMPHSABS 2.8 11/22/2021 1426   MONOABS 0.7 11/22/2021 1426   EOSABS 0.3 11/22/2021 1426   BASOSABS 0.0 11/22/2021 1426    CMP     Component Value Date/Time   NA 132 (L) 11/22/2021 1426   NA 137 06/16/2021 0000   K 3.6 11/22/2021 1426   CL 94 (L) 11/22/2021 1426   CO2 28 11/22/2021 1426   GLUCOSE 82 11/22/2021 1426   BUN 6 (L) 11/22/2021 1426   BUN 7 06/16/2021 0000   CREATININE 0.60 11/22/2021 1426   CALCIUM 8.9 11/22/2021 1426   PROT 6.4 08/28/2021 1059   ALBUMIN 4.0 08/28/2021 1059   AST 16 08/28/2021 1059   ALT 15 08/28/2021 1059   ALKPHOS 106 08/28/2021 1059   BILITOT 0.4 08/28/2021 1059   GFRNONAA >60 11/22/2021 1426   GFRAA >60 09/14/2019 1422   ADDENDUM REPORT: 02/09/2021 18:14   CLINICAL DATA:  Preop evaluation   EXAM: Cardiac/Coronary CTA   TECHNIQUE: The patient was scanned on a [employer].   FINDINGS: A 100 kV prospective scan was triggered in the descending thoracic aorta at 111 HU's. Axial non-contrast 3 mm slices were carried out through the heart. The data set was analyzed on a dedicated work station and scored using the [place]. Gantry rotation speed was 250 msecs and collimation was .6 mm. 0.8 mg of sl NTG was given. The 3D data  set was reconstructed in 5% intervals of the 35-75 % of the R-R cycle. Phases were analyzed on a dedicated work station using MPR, MIP and VRT modes. The patient received 80 cc of contrast.   Coronary Arteries:  Normal coronary origin.  Right dominance.   RCA is a large dominant artery that gives rise to PDA and PLA. There is no plaque.   Left main is a large artery that gives rise to LAD and LCX arteries. Calcified plaque in ostial left main causes minimal (0-24%) stenosis   LAD is a large vessel that has no plaque.   LCX is a non-dominant artery that gives rise to one large OM1 branch. There is no plaque.   Other findings:   Left Ventricle: Normal size   Left Atrium: Mild enlargement   Pulmonary Veins: Normal configuration   Right Ventricle: Normal size   Right Atrium: Normal size   Cardiac valves: No calcifications   Thoracic aorta: Normal size   Pulmonary Arteries: Normal size   Systemic Veins: Normal drainage  Pericardium: Normal thickness   IMPRESSION: 1. Coronary calcium score of 2. This was 41st percentile for age and sex matched control.   2. Normal coronary origin with right dominance.   3. 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 Minimal non-obstructive CAD (0-24%). Consider non-atherosclerotic causes of chest pain. Consider preventive therapy and risk factor modification.   TTE Nov 2021 IMPRESSIONS     1. Left ventricular ejection fraction, by estimation, is 65 to 70%. The  left ventricle has normal function. The left ventricle has no regional  wall motion abnormalities. Left ventricular diastolic parameters were  normal.   2. Right ventricular systolic function is normal. The right ventricular  size is normal. There is normal pulmonary artery systolic pressure.   3. The mitral valve is normal in structure. Trivial mitral valve  regurgitation.   4. The aortic valve is normal in  structure. Aortic valve regurgitation is  not visualized.   5. The inferior vena cava is normal in size with greater than 50%  respiratory variability, suggesting right atrial pressure of 3 mmHg.   Comparison(s): The left ventricular function is unchanged.    ASSESSMENT AND PLAN: 71 year old female with diabetes, obesity, bipolar disorder, with iron deficiency anemia and positive fecal occult blood test without overt GI bleeding.  She previously had negative Cologuard in 2021.  She had a normal colonoscopy in 2006.  She has chronic GERD symptoms which are fairly well controlled with omeprazole.  She has never had an upper endoscopy. I think an upper and lower endoscopy are indicated to further evaluate her iron deficiency anemia.  We will schedule for EGD and colonoscopy.  She has no known cardiopulmonary comorbidities and denies any concerning symptoms.  Although her functional status is limited due to musculoskeletal problems, she had a preoperative evaluation a year ago by cardiology and had a normal CTA.  I do not think any further preoperative evaluation is needed for these low risk procedures.   Iron def anemia/pos FOBT - EGD/colonoscopy  GERD - Continue omeprazole  The details, risks (including bleeding, perforation, infection, missed lesions, medication reactions and possible hospitalization or surgery if complications occur), benefits, and alternatives to EGD/colonoscopy with possible biopsy and possible polypectomy were discussed with the patient and she consents to proceed.    Yitzchak Kothari E. Candis Schatz, MD [facility]   CC:  Tawnya Crook, MD

## 2021-12-30 ENCOUNTER — Ambulatory Visit (INDEPENDENT_AMBULATORY_CARE_PROVIDER_SITE_OTHER): Payer: Medicare PPO | Admitting: Family Medicine

## 2021-12-30 ENCOUNTER — Encounter: Payer: Self-pay | Admitting: Family Medicine

## 2021-12-30 VITALS — BP 140/78 | HR 93 | Temp 98.0°F | Ht 61.0 in | Wt 223.0 lb

## 2021-12-30 DIAGNOSIS — E1159 Type 2 diabetes mellitus with other circulatory complications: Secondary | ICD-10-CM

## 2021-12-30 DIAGNOSIS — Z23 Encounter for immunization: Secondary | ICD-10-CM | POA: Diagnosis not present

## 2021-12-30 DIAGNOSIS — M1A9XX1 Chronic gout, unspecified, with tophus (tophi): Secondary | ICD-10-CM

## 2021-12-30 DIAGNOSIS — E1140 Type 2 diabetes mellitus with diabetic neuropathy, unspecified: Secondary | ICD-10-CM | POA: Diagnosis not present

## 2021-12-30 DIAGNOSIS — D500 Iron deficiency anemia secondary to blood loss (chronic): Secondary | ICD-10-CM

## 2021-12-30 DIAGNOSIS — I152 Hypertension secondary to endocrine disorders: Secondary | ICD-10-CM | POA: Diagnosis not present

## 2021-12-30 LAB — CBC WITH DIFFERENTIAL/PLATELET
Basophils Absolute: 0 10*3/uL (ref 0.0–0.1)
Basophils Relative: 0.3 % (ref 0.0–3.0)
Eosinophils Absolute: 0.2 10*3/uL (ref 0.0–0.7)
Eosinophils Relative: 2 % (ref 0.0–5.0)
HCT: 32.6 % — ABNORMAL LOW (ref 36.0–46.0)
Hemoglobin: 10.7 g/dL — ABNORMAL LOW (ref 12.0–15.0)
Lymphocytes Relative: 19.7 % (ref 12.0–46.0)
Lymphs Abs: 2 10*3/uL (ref 0.7–4.0)
MCHC: 32.8 g/dL (ref 30.0–36.0)
MCV: 84.9 fl (ref 78.0–100.0)
Monocytes Absolute: 0.7 10*3/uL (ref 0.1–1.0)
Monocytes Relative: 7 % (ref 3.0–12.0)
Neutro Abs: 7.3 10*3/uL (ref 1.4–7.7)
Neutrophils Relative %: 71 % (ref 43.0–77.0)
Platelets: 458 10*3/uL — ABNORMAL HIGH (ref 150.0–400.0)
RBC: 3.84 Mil/uL — ABNORMAL LOW (ref 3.87–5.11)
RDW: 17.6 % — ABNORMAL HIGH (ref 11.5–15.5)
WBC: 10.3 10*3/uL (ref 4.0–10.5)

## 2021-12-30 LAB — IBC + FERRITIN
Ferritin: 27.6 ng/mL (ref 10.0–291.0)
Iron: 32 ug/dL — ABNORMAL LOW (ref 42–145)
Saturation Ratios: 10.4 % — ABNORMAL LOW (ref 20.0–50.0)
TIBC: 308 ug/dL (ref 250.0–450.0)
Transferrin: 220 mg/dL (ref 212.0–360.0)

## 2021-12-30 LAB — URIC ACID: Uric Acid, Serum: 4.4 mg/dL (ref 2.4–7.0)

## 2021-12-30 LAB — COMPREHENSIVE METABOLIC PANEL
ALT: 13 U/L (ref 0–35)
AST: 17 U/L (ref 0–37)
Albumin: 3.9 g/dL (ref 3.5–5.2)
Alkaline Phosphatase: 103 U/L (ref 39–117)
BUN: 10 mg/dL (ref 6–23)
CO2: 30 mEq/L (ref 19–32)
Calcium: 8.7 mg/dL (ref 8.4–10.5)
Chloride: 96 mEq/L (ref 96–112)
Creatinine, Ser: 0.65 mg/dL (ref 0.40–1.20)
GFR: 88.6 mL/min (ref >60.00)
Glucose, Bld: 112 mg/dL — ABNORMAL HIGH (ref 70–99)
Potassium: 3.4 mEq/L — ABNORMAL LOW (ref 3.5–5.1)
Sodium: 136 mEq/L (ref 135–145)
Total Bilirubin: 0.3 mg/dL (ref 0.2–1.2)
Total Protein: 6.8 g/dL (ref 6.0–8.3)

## 2021-12-30 LAB — VITAMIN B12: Vitamin B-12: >1500 pg/mL — ABNORMAL HIGH (ref 211–911)

## 2021-12-30 LAB — HEMOGLOBIN A1C: Hgb A1c MFr Bld: 6.9 % — ABNORMAL HIGH (ref 4.6–6.5)

## 2021-12-30 MED ORDER — DOXEPIN HCL 10 MG PO CAPS
10.0000 mg | ORAL_CAPSULE | Freq: Every day | ORAL | 0 refills | Status: DC
  Filled 2022-05-19: qty 90, 90d supply, fill #0

## 2021-12-30 MED ORDER — OXYBUTYNIN CHLORIDE ER 10 MG PO TB24
10.0000 mg | ORAL_TABLET | Freq: Every day | ORAL | 3 refills | Status: DC
  Filled 2022-05-19: qty 90, 90d supply, fill #0
  Filled 2022-08-11: qty 90, 90d supply, fill #1
  Filled 2022-11-10: qty 90, 90d supply, fill #2

## 2021-12-30 MED ORDER — LISINOPRIL 5 MG PO TABS: 5.0000 mg | ORAL_TABLET | Freq: Every day | ORAL | 1 refills | Status: DC

## 2021-12-30 MED ORDER — SERTRALINE HCL 100 MG PO TABS: 100.0000 mg | ORAL_TABLET | Freq: Every day | ORAL | 0 refills | Status: DC

## 2021-12-30 MED ORDER — PRAVASTATIN SODIUM 20 MG PO TABS
20.0000 mg | ORAL_TABLET | Freq: Every day | ORAL | 2 refills | Status: DC
  Filled 2022-05-19: qty 90, 90d supply, fill #0
  Filled 2022-08-11: qty 90, 90d supply, fill #1
  Filled 2022-11-10: qty 90, 90d supply, fill #2

## 2021-12-30 MED ORDER — LEVOTHYROXINE SODIUM 50 MCG PO TABS
50.0000 ug | ORAL_TABLET | Freq: Every day | ORAL | 2 refills | Status: DC
  Filled 2022-05-19: qty 90, 90d supply, fill #0
  Filled 2022-08-11: qty 90, 90d supply, fill #1
  Filled 2022-11-10: qty 90, 90d supply, fill #2

## 2021-12-30 MED ORDER — POTASSIUM CITRATE ER 10 MEQ (1080 MG) PO TBCR
10.0000 meq | EXTENDED_RELEASE_TABLET | Freq: Every day | ORAL | 0 refills | Status: DC
  Filled 2022-05-19: qty 90, 90d supply, fill #0

## 2021-12-30 MED ORDER — ALLOPURINOL 300 MG PO TABS
300.0000 mg | ORAL_TABLET | Freq: Every day | ORAL | 2 refills | Status: DC
  Filled 2022-05-19: qty 90, 90d supply, fill #0
  Filled 2022-08-11: qty 90, 90d supply, fill #1
  Filled 2022-11-10: qty 90, 90d supply, fill #2

## 2021-12-30 NOTE — Progress Notes (Signed)
Hgb stable.  Is she taking iron?  Should be taking '325mg'$  daily.  May need stool softener 2.  A1C at goal but a little higher than last time.  She can do metformin 1 tab twice daily-and please verify-Levimir- is it 23 or 26 units?. 3.  B12 high-can decrease dose or do every other day 4.  Potassium is a little low.  Send in potassium 77mq daily-repeat potassium in 2-3 wks.

## 2021-12-30 NOTE — Progress Notes (Signed)
Subjective:     Patient ID: Brittany Acosta, female    DOB: 10-30-1950, 71 y.o.   MRN: [id number]  Chief Complaint  Patient presents with   Follow-up    2 month follow-up on anemia and dm     HPI-here w/husb  DM-Tresiba 23 units, metformin '1000mg'$  am and 500pm.  Sugar 154 this am.  112-124 usu.   Anemia-stool +.  Colon next mo and EGD.  No bleeding.  In general, feeling weak but not worsening.   3.  HTN-Pt is on Lisinopril '5mg'$    Bp's running  13-140/70's.  No cp/palp/edema/cough.  +old DOE.  Some dizziness when stands up. Wakes up w/HA-thinks from neck.  Sleep study long time ago.  4.  Balance poor-has fallen twice.  Using cane now.  Has done PT in past.  Dr. Dala Dock med-sending to pool PT.  Getting MRI neck.     There are no preventive care reminders to display for this patient.   Past Medical History:  Diagnosis Date   Allergic rhinitis, cause unspecified    Arthritis    Bipolar disorder, unspecified ([place])    depression   Cancer ([place])    skin cancer on left leg, left breast cancer   Chronic diastolic heart failure (HCC)    Depression    GERD (gastroesophageal reflux disease)    Headache    History of gout    "haven't had it in several years" (02/08/2012)   History of kidney stones    History of wrist fracture    rt wrist   Hypertension    Iron deficiency anemia    Kidney stones    sees urologist @ [facility]   Morbid obesity ([place])    Neuropathy due to secondary diabetes ([place])    Nontoxic multinodular goiter    Osteoporosis    Personal history of radiation therapy    Pneumonia    PONV (postoperative nausea and vomiting)    PTSD (post-traumatic stress disorder)    Pure hypercholesterolemia    RBBB    Scleroderma ([place])    Sleep apnea 10/2012   mild osa-did not need cpap -dr clance   Type II diabetes mellitus ([place])    Unspecified hypothyroidism     Past Surgical History:  Procedure Laterality Date   ABDOMINAL HYSTERECTOMY  1976   BREAST EXCISIONAL BIOPSY Left     BREAST LUMPECTOMY Left 04/25/2018   BREAST LUMPECTOMY WITH RADIOACTIVE SEED AND SENTINEL LYMPH NODE BIOPSY Left 04/25/2018   Procedure: LEFT BREAST LUMPECTOMY WITH RADIOACTIVE SEED AND SENTINEL LYMPH NODE BIOPSY;  Surgeon: Excell Seltzer, MD;  Location: [place] [facility];  Service: General;  Laterality: Left;   [place]  2001   sees Dr Peter Martinique   CATARACT EXTRACTION  2014   CATARACT EXTRACTION, BILATERAL  02/2011   epps   CHOLECYSTECTOMY  1985   EXCISIONAL HEMORRHOIDECTOMY     "dr cut out in his office" (02/08/2012)   [place]  2000   right   JOINT REPLACEMENT     rt knee   KNEE ARTHROSCOPY  08/2009   right   KNEE ARTHROSCOPY WITH MEDIAL MENISECTOMY     left   Left Cystoscopy   [place]   Lithotripsy (L) Kidney  1997   LUMBAR LAMINECTOMY/DECOMPRESSION MICRODISCECTOMY Left 07/01/2016   Procedure: Laminectomy for synovial cyst - left - Lumbar four-five;  Surgeon: Earnie Larsson, MD;  Location: [place] [facility];  Service:  Neurosurgery;  Laterality: Left;   PARTIAL MASTECTOMY WITH NEEDLE LOCALIZATION Left 09/01/2012   Procedure: PARTIAL MASTECTOMY WITH NEEDLE LOCALIZATION;  Surgeon: Adin Hector, MD;  Location: [facility];  Service: General;  Laterality: Left;   [place] stone removed (L) Kidney  1992   REVERSE SHOULDER ARTHROPLASTY Right 02/12/2021   Procedure: REVERSE SHOULDER ARTHROPLASTY;  Surgeon: Tania Ade, MD;  Location: [facility];  Service: Orthopedics;  Laterality: Right;   Right nasal surgery  08/1988   Right sinus removed  08/1989   tooth partial   ROTATOR CUFF REPAIR  2013   right shoulder x 2   SHOULDER ARTHROSCOPY WITH ROTATOR CUFF REPAIR AND SUBACROMIAL DECOMPRESSION Left 08/20/2013   Procedure: SHOULDER ARTHROSCOPY  AND SUBACROMIAL DECOMPRESSION;  Surgeon: Nita Sells, MD;  Location: [facility];  Service: Orthopedics;  Laterality: Left;  Left shoulder arthroscopy, debridement,  subacromial decompression, distal clavical resection   THYROIDECTOMY  04/22/2011   Procedure: THYROIDECTOMY;  Surgeon: Onnie Graham, MD;  Location: [facility];  Service: ENT;  Laterality: N/A;  TOTAL THYROIDECOTMY   TOTAL KNEE ARTHROPLASTY  06/18/2011   Procedure: TOTAL KNEE ARTHROPLASTY;  Surgeon: Kerin Salen, MD;  Location: [facility];  Service: Orthopedics;  Laterality: Left;  DEPUY   TOTAL KNEE ARTHROPLASTY  02/07/2012   Procedure: TOTAL KNEE ARTHROPLASTY;  Surgeon: Kerin Salen, MD;  Location: [facility];  Service: Orthopedics;  Laterality: Right;   TUBAL LIGATION  1972    Outpatient Medications Prior to Visit  Medication Sig Dispense Refill   anastrozole (ARIMIDEX) 1 MG tablet TAKE 1 TABLET EVERY DAY 90 tablet 0   aspirin EC 81 MG tablet Take 1 tablet (81 mg total) by mouth daily. Swallow whole. 90 tablet 3   Biotin 10000 MCG TABS Take 1 tablet by mouth daily in the afternoon.     Black Elderberry,Berry-Flower, 575 MG CAPS as directed Orally     Cholecalciferol 25 MCG (1000 UT) CHEW 1 tablet Orally Once a day for 30 day(s)     Chromium-Cinnamon (CINNAMON PLUS CHROMIUM PO) Take 2 tablets by mouth daily.     cyclobenzaprine (FLEXERIL) 10 MG tablet Take 1 tablet (10 mg total) by mouth at bedtime as needed for muscle spasms. 90 tablet 1   fluticasone (FLONASE) 50 MCG/ACT nasal spray USE 2 SPRAYS IN EACH NOSTRIL EVERY DAY 48 g 1   folic acid (FOLVITE) 1 MG tablet Take 2 mg by mouth daily.     furosemide (LASIX) 40 MG tablet Take 1 tablet (40 mg total) by mouth daily. 90 tablet 3   gabapentin (NEURONTIN) 300 MG capsule Take 300 mg by mouth 3 (three) times daily.     Ginkgo Biloba (GINKOBA PO) Take 3,000 capsules by mouth daily in the afternoon.     Glucosamine 500 MG CAPS 1 capsule with a meal Orally Three times a day for 30 day(s)     glucose blood (TRUE METRIX BLOOD GLUCOSE TEST) test strip TEST BLOOD SUGAR EVERY DAY AND AS NEEDED 200 strip 4   Insulin Pen Needle (DROPLET PEN NEEDLES) 31G X 5 MM  MISC USE TO INJECT INSULIN DAILY 100 each 3   lamoTRIgine (LAMICTAL) 200 MG tablet Take 1 tablet (200 mg total) by mouth daily at 12 noon. 90 tablet 3   loratadine (CLARITIN) 10 MG tablet 1 tablet     metFORMIN (GLUCOPHAGE) 500 MG tablet TAKE 2 TABLETS ('1000MG'$ ) EVERY MORNING AND TAKE 1 TABLET ('500MG'$ ) EVERY EVENING 270 tablet 1   methotrexate  2.5 MG tablet Take 15 mg by mouth every Saturday.     Multiple Vitamins-Minerals (EYE VITAMINS PO) Take 2 tablets by mouth daily.     omeprazole (PRILOSEC) 20 MG capsule Take 1 capsule (20 mg total) by mouth daily. Take 30 minutes before a meal 90 capsule 3   predniSONE (DELTASONE) 5 MG tablet Take 5 mg by mouth daily with breakfast.      sulfaSALAzine (AZULFIDINE) 500 MG tablet Take 2 tablets by mouth 2 (two) times daily.     tamsulosin (FLOMAX) 0.4 MG CAPS capsule TAKE 1 CAPSULE EVERY DAY 90 capsule 1   TRESIBA FLEXTOUCH 100 UNIT/ML FlexTouch Pen INJECT 26 UNITS SUBCUTANEOUSLY DAILY 30 mL 1   allopurinol (ZYLOPRIM) 300 MG tablet TAKE 1 TABLET EVERY DAY 90 tablet 0   doxepin (SINEQUAN) 10 MG capsule TAKE 1 CAPSULE EVERY DAY 90 capsule 0   levothyroxine (SYNTHROID) 50 MCG tablet TAKE 1 TABLET EVERY DAY (OFFICE VISIT NEEDED FOR FURTHER REFILLS) 90 tablet 1   lisinopril (ZESTRIL) 5 MG tablet TAKE 1 TABLET EVERY DAY 90 tablet 1   oxybutynin (DITROPAN-XL) 10 MG 24 hr tablet TAKE 1 TABLET (10 MG TOTAL) BY MOUTH AT BEDTIME. 90 tablet 0   potassium citrate (UROCIT-K) 10 MEQ (1080 MG) SR tablet Take 1 tablet (10 mEq total) by mouth daily in the afternoon. 90 tablet 1   pravastatin (PRAVACHOL) 20 MG tablet TAKE 1 TABLET (20 MG TOTAL) BY MOUTH AT BEDTIME. 90 tablet 0   sertraline (ZOLOFT) 100 MG tablet TAKE 1 TABLET EVERY DAY 90 tablet 0   Cholecalciferol (VITAMIN D3) 125 MCG (5000 UT) CAPS Take 1 capsule by mouth daily in the afternoon.     ELDERBERRY PO Take 5,000 capsules by mouth daily in the afternoon.     No facility-administered medications prior to visit.     No Known Allergies ROS neg/noncontributory except as noted HPI/below      Objective:     BP (!) 140/78   Pulse 93   Temp 98 F (36.7 C) (Temporal)   Ht '5\' 1"'$  (1.549 m)   Wt 223 lb (101.2 kg)   SpO2 96%   BMI 42.14 kg/m  Wt Readings from Last 3 Encounters:  12/30/21 223 lb (101.2 kg)  12/29/21 221 lb 9.6 oz (100.5 kg)  11/23/21 221 lb 4 oz (100.4 kg)    Physical Exam   Gen: WDWN NAD HEENT: NCAT, conjunctiva not injected, sclera nonicteric NECK:  supple, no thyromegaly, no nodes, no carotid bruits CARDIAC: RRR, S1S2+, no murmur. DP 2+B LUNGS: CTAB. No wheezes ABDOMEN:  BS+, soft, NTND, No HSM, no masses EXT:  ankle edema MSK: cane  NEURO: A&O x3.  CN II-XII intact.  PSYCH: normal mood. Good eye contact     Assessment & Plan:   Problem List Items Addressed This Visit       Cardiovascular and Mediastinum   Hypertension associated with diabetes ([place]) - Primary   Relevant Medications   lisinopril (ZESTRIL) 5 MG tablet   pravastatin (PRAVACHOL) 20 MG tablet   Other Relevant Orders   Comprehensive metabolic panel     Endocrine   Diabetic neuropathy, painful (HCC)   Relevant Medications   lisinopril (ZESTRIL) 5 MG tablet   pravastatin (PRAVACHOL) 20 MG tablet   Other Relevant Orders   Comprehensive metabolic panel   Hemoglobin A1c     Musculoskeletal and Integument   Gout, tophaceous   Relevant Medications   allopurinol (ZYLOPRIM) 300 MG tablet   Other  Relevant Orders   Uric acid   Other Visit Diagnoses     Iron deficiency anemia due to chronic blood loss       Relevant Orders   CBC with Differential/Platelet   IBC + Ferritin   Vitamin B12   Need for immunization against influenza       Relevant Orders   Flu Vaccine QUAD High Dose(Fluad) (Completed)      HTN-chronic.  Mostly controlled.  Pt at fall risk so not want too low.  Cont lisinopril '5mg'$ .  F/u 4 mo.  Check cmp DM type 2 w/neuropathy-chronic.  Controlled.  Check A1C.  If still  controlled, dec metformin to '500mg'$  bid and cont tresiba 23 units daily.  Work on diet.  Can't exercise well.  IDA-+ stools.  Getting w/u GI.  Reck cbc,iron studies, B12.   Gout-chronic.  Stable on allopurinol '300mg'$ .  Cont.  Check uric acid.   F/u 4 mo-moods, DM,thyroid  Meds ordered this encounter  Medications   allopurinol (ZYLOPRIM) 300 MG tablet    Sig: Take 1 tablet (300 mg total) by mouth daily.    Dispense:  90 tablet    Refill:  3   doxepin (SINEQUAN) 10 MG capsule    Sig: Take 1 capsule (10 mg total) by mouth daily.    Dispense:  90 capsule    Refill:  1   levothyroxine (SYNTHROID) 50 MCG tablet    Sig: TAKE 1 TABLET EVERY DAY (OFFICE VISIT NEEDED FOR FURTHER REFILLS)    Dispense:  90 tablet    Refill:  3   lisinopril (ZESTRIL) 5 MG tablet    Sig: Take 1 tablet (5 mg total) by mouth daily.    Dispense:  90 tablet    Refill:  1   oxybutynin (DITROPAN-XL) 10 MG 24 hr tablet    Sig: Take 1 tablet (10 mg total) by mouth at bedtime.    Dispense:  90 tablet    Refill:  3   potassium citrate (UROCIT-K) 10 MEQ (1080 MG) SR tablet    Sig: Take 1 tablet (10 mEq total) by mouth daily in the afternoon.    Dispense:  90 tablet    Refill:  1   pravastatin (PRAVACHOL) 20 MG tablet    Sig: Take 1 tablet (20 mg total) by mouth at bedtime.    Dispense:  90 tablet    Refill:  3   sertraline (ZOLOFT) 100 MG tablet    Sig: Take 1 tablet (100 mg total) by mouth daily.    Dispense:  90 tablet    Refill:  0    Wellington Hampshire, MD

## 2021-12-30 NOTE — Patient Instructions (Signed)
It was very nice to see you today!  Work on diet   PLEASE NOTE:  If you had any lab tests please let us know if you have not heard back within a few days. You may see your results on MyChart before we have a chance to review them but we will give you a call once they are reviewed by [place]. If we ordered any referrals today, please let us know if you have not heard from their office within the next week.   Please try these tips to maintain a healthy lifestyle:  Eat most of your calories during the day when you are active. Eliminate processed foods including packaged sweets (pies, cakes, cookies), reduce intake of potatoes, white bread, white pasta, and white rice. Look for whole grain options, oat flour or almond flour.  Each meal should contain half fruits/vegetables, one quarter protein, and one quarter carbs (no bigger than a computer mouse).  Cut down on sweet beverages. This includes juice, soda, and sweet tea. Also watch fruit intake, though this is a healthier sweet option, it still contains natural sugar! Limit to 3 servings daily.  Drink at least 1 glass of water with each meal and aim for at least 8 glasses per day  Exercise at least 150 minutes every week.

## 2021-12-31 ENCOUNTER — Other Ambulatory Visit: Payer: Self-pay | Admitting: *Deleted

## 2021-12-31 DIAGNOSIS — E876 Hypokalemia: Secondary | ICD-10-CM

## 2021-12-31 MED ORDER — POTASSIUM CHLORIDE ER 20 MEQ PO TBCR: 1.0000 | EXTENDED_RELEASE_TABLET | Freq: Every day | ORAL | 1 refills | Status: DC

## 2022-01-02 ENCOUNTER — Other Ambulatory Visit: Payer: Self-pay | Admitting: Physician Assistant

## 2022-01-04 ENCOUNTER — Other Ambulatory Visit: Payer: Self-pay | Admitting: Sports Medicine

## 2022-01-04 DIAGNOSIS — M25511 Pain in right shoulder: Secondary | ICD-10-CM

## 2022-01-04 DIAGNOSIS — R296 Repeated falls: Secondary | ICD-10-CM

## 2022-01-04 DIAGNOSIS — M542 Cervicalgia: Secondary | ICD-10-CM

## 2022-01-11 DIAGNOSIS — R768 Other specified abnormal immunological findings in serum: Secondary | ICD-10-CM | POA: Diagnosis not present

## 2022-01-11 DIAGNOSIS — M109 Gout, unspecified: Secondary | ICD-10-CM | POA: Diagnosis not present

## 2022-01-11 DIAGNOSIS — L603 Nail dystrophy: Secondary | ICD-10-CM | POA: Diagnosis not present

## 2022-01-11 DIAGNOSIS — M797 Fibromyalgia: Secondary | ICD-10-CM | POA: Diagnosis not present

## 2022-01-11 DIAGNOSIS — Z13820 Encounter for screening for osteoporosis: Secondary | ICD-10-CM | POA: Diagnosis not present

## 2022-01-11 DIAGNOSIS — Z79899 Other long term (current) drug therapy: Secondary | ICD-10-CM | POA: Diagnosis not present

## 2022-01-11 DIAGNOSIS — G629 Polyneuropathy, unspecified: Secondary | ICD-10-CM | POA: Diagnosis not present

## 2022-01-11 DIAGNOSIS — M1990 Unspecified osteoarthritis, unspecified site: Secondary | ICD-10-CM | POA: Diagnosis not present

## 2022-01-11 DIAGNOSIS — L4050 Arthropathic psoriasis, unspecified: Secondary | ICD-10-CM | POA: Diagnosis not present

## 2022-01-13 DIAGNOSIS — M47816 Spondylosis without myelopathy or radiculopathy, lumbar region: Secondary | ICD-10-CM | POA: Diagnosis not present

## 2022-01-18 ENCOUNTER — Other Ambulatory Visit (INDEPENDENT_AMBULATORY_CARE_PROVIDER_SITE_OTHER): Payer: Medicare PPO

## 2022-01-18 DIAGNOSIS — E876 Hypokalemia: Secondary | ICD-10-CM

## 2022-01-18 DIAGNOSIS — M25511 Pain in right shoulder: Secondary | ICD-10-CM

## 2022-01-18 DIAGNOSIS — M542 Cervicalgia: Secondary | ICD-10-CM

## 2022-01-18 DIAGNOSIS — R296 Repeated falls: Secondary | ICD-10-CM

## 2022-01-18 LAB — POTASSIUM: Potassium: 4.1 mEq/L (ref 3.5–5.1)

## 2022-01-19 NOTE — Progress Notes (Signed)
Potassium is better.  Continue taking potassium

## 2022-01-21 ENCOUNTER — Telehealth: Payer: Self-pay

## 2022-01-21 NOTE — Patient Outreach (Signed)
  Care Coordination   01/21/2022 Name: Brittany Acosta MRN: [id number] DOB: Mar 05, 1951   Care Coordination Outreach Attempts:  An unsuccessful telephone outreach was attempted today to offer the patient information about available care coordination services as a benefit of their health plan.   Follow Up Plan:  Additional outreach attempts will be made to offer the patient care coordination information and services.   Encounter Outcome:  No Answer  Care Coordination Interventions Activated:  No   Care Coordination Interventions:  No, not indicated   Peter Garter RN, BSN,CCM, [facility] [phone]

## 2022-01-23 ENCOUNTER — Ambulatory Visit
Admission: RE | Admit: 2022-01-23 | Discharge: 2022-01-23 | Disposition: A | Discharge status: Home / Self Care | Payer: Medicare PPO | Source: Ambulatory Visit | Attending: Sports Medicine | Admitting: Sports Medicine

## 2022-01-23 DIAGNOSIS — M4802 Spinal stenosis, cervical region: Secondary | ICD-10-CM | POA: Diagnosis not present

## 2022-01-23 DIAGNOSIS — M542 Cervicalgia: Secondary | ICD-10-CM | POA: Diagnosis not present

## 2022-01-25 ENCOUNTER — Other Ambulatory Visit: Payer: Self-pay | Admitting: Family Medicine

## 2022-01-25 ENCOUNTER — Other Ambulatory Visit: Payer: Self-pay | Admitting: Physician Assistant

## 2022-01-25 DIAGNOSIS — H9203 Otalgia, bilateral: Secondary | ICD-10-CM

## 2022-01-25 DIAGNOSIS — J019 Acute sinusitis, unspecified: Secondary | ICD-10-CM

## 2022-01-26 ENCOUNTER — Other Ambulatory Visit: Payer: Self-pay | Admitting: Sports Medicine

## 2022-01-26 ENCOUNTER — Encounter: Payer: Self-pay | Admitting: Gastroenterology

## 2022-01-26 ENCOUNTER — Ambulatory Visit: Payer: Medicare PPO

## 2022-01-26 DIAGNOSIS — R9389 Abnormal findings on diagnostic imaging of other specified body structures: Secondary | ICD-10-CM | POA: Diagnosis not present

## 2022-01-26 DIAGNOSIS — M542 Cervicalgia: Secondary | ICD-10-CM

## 2022-01-26 DIAGNOSIS — R296 Repeated falls: Secondary | ICD-10-CM

## 2022-01-26 DIAGNOSIS — G44301 Post-traumatic headache, unspecified, intractable: Secondary | ICD-10-CM

## 2022-01-26 DIAGNOSIS — G44309 Post-traumatic headache, unspecified, not intractable: Secondary | ICD-10-CM | POA: Diagnosis not present

## 2022-01-28 ENCOUNTER — Ambulatory Visit
Admission: RE | Admit: 2022-01-28 | Discharge: 2022-01-28 | Disposition: A | Discharge status: Home / Self Care | Payer: Medicare PPO | Source: Ambulatory Visit | Attending: Sports Medicine | Admitting: Sports Medicine

## 2022-01-28 DIAGNOSIS — R296 Repeated falls: Secondary | ICD-10-CM

## 2022-01-28 DIAGNOSIS — M542 Cervicalgia: Secondary | ICD-10-CM | POA: Diagnosis not present

## 2022-01-28 DIAGNOSIS — G44301 Post-traumatic headache, unspecified, intractable: Secondary | ICD-10-CM

## 2022-02-02 ENCOUNTER — Encounter: Payer: Medicare PPO | Admitting: Gastroenterology

## 2022-02-09 DIAGNOSIS — M47816 Spondylosis without myelopathy or radiculopathy, lumbar region: Secondary | ICD-10-CM | POA: Diagnosis not present

## 2022-02-14 ENCOUNTER — Other Ambulatory Visit: Payer: Self-pay | Admitting: Family Medicine

## 2022-02-17 ENCOUNTER — Encounter: Payer: Self-pay | Admitting: Family Medicine

## 2022-02-17 ENCOUNTER — Ambulatory Visit (INDEPENDENT_AMBULATORY_CARE_PROVIDER_SITE_OTHER): Payer: Medicare PPO | Admitting: Family Medicine

## 2022-02-17 VITALS — BP 160/90 | HR 74 | Temp 98.0°F | Ht 61.0 in | Wt 214.1 lb

## 2022-02-17 DIAGNOSIS — I152 Hypertension secondary to endocrine disorders: Secondary | ICD-10-CM | POA: Diagnosis not present

## 2022-02-17 DIAGNOSIS — E1159 Type 2 diabetes mellitus with other circulatory complications: Secondary | ICD-10-CM

## 2022-02-17 DIAGNOSIS — E1165 Type 2 diabetes mellitus with hyperglycemia: Secondary | ICD-10-CM

## 2022-02-17 DIAGNOSIS — J0100 Acute maxillary sinusitis, unspecified: Secondary | ICD-10-CM | POA: Diagnosis not present

## 2022-02-17 DIAGNOSIS — R112 Nausea with vomiting, unspecified: Secondary | ICD-10-CM | POA: Diagnosis not present

## 2022-02-17 MED ORDER — ONDANSETRON HCL 4 MG/2ML IJ SOLN: 4.0000 mg | Freq: Three times a day (TID) | INTRAMUSCULAR | Status: DC | PRN

## 2022-02-17 MED ORDER — ONDANSETRON HCL 4 MG/2ML IJ SOLN
4.0000 mg | Freq: Once | INTRAMUSCULAR | Status: AC
  Administered 2022-02-17: 4 mg via INTRAMUSCULAR

## 2022-02-17 MED ORDER — ONDANSETRON 4 MG PO TBDP: 4.0000 mg | ORAL_TABLET | Freq: Three times a day (TID) | ORAL | 0 refills | Status: DC | PRN

## 2022-02-17 MED ORDER — CEFTRIAXONE SODIUM 500 MG IJ SOLR
500.0000 mg | Freq: Once | INTRAMUSCULAR | Status: AC
  Administered 2022-02-17: 500 mg via INTRAMUSCULAR

## 2022-02-17 MED ORDER — AMOXICILLIN-POT CLAVULANATE 250-62.5 MG/5ML PO SUSR
500.0000 mg | Freq: Three times a day (TID) | ORAL | 0 refills | Status: DC
Start: 2022-02-17 — End: 2022-02-24

## 2022-02-17 NOTE — Patient Instructions (Addendum)
Sips of fluids every 10-30mnutes to get hydrated  Zofran for nausea if needed  Start the augmentin tomorrow-liquid  Take lisinopril today-2 of them  Take tresiba 5 units daily and increase according to sugars.  ER if weak, can't stop vomiting, etc.

## 2022-02-17 NOTE — Progress Notes (Signed)
Subjective:     Patient ID: Brittany Acosta, female    DOB: 1950/06/26, 71 y.o.   MRN: [id number]  Chief Complaint  Patient presents with   Sinus Problem    Sinus problem for 2 weeks  Has been taking OTC sinus medicine   Emesis    Cannot keep medicine or food down, haven't taking any regular medications since 01/28/22   Fatigue    HPI-here w/husb Sinus issues for 2wks. Runny nose, congestion, intermitt ears hurting. Face pain, HA.  No fever, but some chills. Cough.  No sob. Not getting better.  Hard to swallow.   Was  supposed to get EGD and cscope, but neck pain and saw Dr. Paulla Fore and CT/MRI and concerned about fx so pt cancelled scopes  Nausea and Vomiting-cannot keep food or meds down for 2 wks.  Occ makes self vomit as pills getting stuck.  The smell of food makes sick.  Eating some fruit and veges.  This occurred 5-6 yrs ago w/sinuses as well.    Able to drink some water.  No abd pain-last bm 3 days ago. Eating prunes.   No meds for 2 wks.  Sugar 191 this am.   On 11/7-"nerves burned" from lower back  Health Maintenance Due  Topic Date Due   Medicare Annual Wellness (AWV)  09/04/2020    Past Medical History:  Diagnosis Date   Allergic rhinitis, cause unspecified    Arthritis    Bipolar disorder, unspecified ([place])    depression   Cancer ([place])    skin cancer on left leg, left breast cancer   Chronic diastolic heart failure (H[facility])    Depression    GERD (gastroesophageal reflux disease)    Headache    History of gout    "haven't had it in several years" (02/08/2012)   History of kidney stones    History of wrist fracture    rt wrist   Hypertension    Iron deficiency anemia    Kidney stones    sees urologist @ [facility]   Morbid obesity ([place])    Neuropathy due to secondary diabetes ([place])    Nontoxic multinodular goiter    Osteoporosis    Personal history of radiation therapy    Pneumonia    PONV (postoperative nausea and vomiting)    PTSD (post-traumatic stress  disorder)    Pure hypercholesterolemia    RBBB    Scleroderma ([place])    Sleep apnea 10/2012   mild osa-did not need cpap -dr clance   Type II diabetes mellitus ([place])    Unspecified hypothyroidism     Past Surgical History:  Procedure Laterality Date   ABDOMINAL HYSTERECTOMY  1976   BREAST EXCISIONAL BIOPSY Left    BREAST LUMPECTOMY Left 04/25/2018   BREAST LUMPECTOMY WITH RADIOACTIVE SEED AND SENTINEL LYMPH NODE BIOPSY Left 04/25/2018   Procedure: LEFT BREAST LUMPECTOMY WITH RADIOACTIVE SEED AND SENTINEL LYMPH NODE BIOPSY;  Surgeon: Excell Seltzer, MD;  Location: [facility];  Service: General;  Laterality: Left;   [place]  2001   sees Dr Peter Martinique   CATARACT EXTRACTION  2014   CATARACT EXTRACTION, BILATERAL  02/2011   epps   CHOLECYSTECTOMY  1985   EXCISIONAL HEMORRHOIDECTOMY     "dr cut out in his office" (02/08/2012)   [place]  2000   right   JOINT REPLACEMENT     rt knee   KNEE ARTHROSCOPY  08/2009  right   KNEE ARTHROSCOPY WITH MEDIAL MENISECTOMY     left   Left Cystoscopy   1990   LEFT OOPHORECTOMY  1980   Lithotripsy (L) Kidney  1997   LUMBAR LAMINECTOMY/DECOMPRESSION MICRODISCECTOMY Left 07/01/2016   Procedure: Laminectomy for synovial cyst - left - Lumbar four-five;  Surgeon: Earnie Larsson, MD;  Location: [facility];  Service: Neurosurgery;  Laterality: Left;   PARTIAL MASTECTOMY WITH NEEDLE LOCALIZATION Left 09/01/2012   Procedure: PARTIAL MASTECTOMY WITH NEEDLE LOCALIZATION;  Surgeon: Adin Hector, MD;  Location: [facility];  Service: General;  Laterality: Left;   Percitania stone removed (L) Kidney  1992   REVERSE SHOULDER ARTHROPLASTY Right 02/12/2021   Procedure: REVERSE SHOULDER ARTHROPLASTY;  Surgeon: Tania Ade, MD;  Location: [facility];  Service: Orthopedics;  Laterality: Right;   Right nasal surgery  08/1988   Right sinus removed  08/1989   tooth partial   ROTATOR CUFF REPAIR  2013   right shoulder x 2    SHOULDER ARTHROSCOPY WITH ROTATOR CUFF REPAIR AND SUBACROMIAL DECOMPRESSION Left 08/20/2013   Procedure: SHOULDER ARTHROSCOPY  AND SUBACROMIAL DECOMPRESSION;  Surgeon: Nita Sells, MD;  Location: [facility];  Service: Orthopedics;  Laterality: Left;  Left shoulder arthroscopy, debridement, subacromial decompression, distal clavical resection   THYROIDECTOMY  04/22/2011   Procedure: THYROIDECTOMY;  Surgeon: Onnie Graham, MD;  Location: [facility];  Service: ENT;  Laterality: N/A;  TOTAL THYROIDECOTMY   TOTAL KNEE ARTHROPLASTY  06/18/2011   Procedure: TOTAL KNEE ARTHROPLASTY;  Surgeon: Kerin Salen, MD;  Location: [facility];  Service: Orthopedics;  Laterality: Left;  DEPUY   TOTAL KNEE ARTHROPLASTY  02/07/2012   Procedure: TOTAL KNEE ARTHROPLASTY;  Surgeon: Kerin Salen, MD;  [facility];  Service: Orthopedics;  Laterality: Right;   TUBAL LIGATION  1972    Outpatient Medications Prior to Visit  Medication Sig Dispense Refill   cyclobenzaprine (FLEXERIL) 10 MG tablet TAKE 1 TABLET (10 MG TOTAL) BY MOUTH AT BEDTIME AS NEEDED FOR MUSCLE SPASMS. 90 tablet 0   allopurinol (ZYLOPRIM) 300 MG tablet Take 1 tablet (300 mg total) by mouth daily. (Patient not taking: Reported on 02/17/2022) 90 tablet 3   anastrozole (ARIMIDEX) 1 MG tablet TAKE 1 TABLET EVERY DAY (Patient not taking: Reported on 02/17/2022) 90 tablet 0   aspirin EC 81 MG tablet Take 1 tablet (81 mg total) by mouth daily. Swallow whole. (Patient not taking: Reported on 02/17/2022) 90 tablet 3   Biotin 10000 MCG TABS Take 1 tablet by mouth daily in the afternoon. (Patient not taking: Reported on 02/17/2022)     Black Elderberry,Berry-Flower, 575 MG CAPS as directed Orally (Patient not taking: Reported on 02/17/2022)     Cholecalciferol 25 MCG (1000 UT) CHEW 1 tablet Orally Once a day for 30 day(s) (Patient not taking: Reported on 02/17/2022)     Chromium-Cinnamon (CINNAMON PLUS CHROMIUM PO) Take 2 tablets by mouth  daily. (Patient not taking: Reported on 02/17/2022)     doxepin (SINEQUAN) 10 MG capsule Take 1 capsule (10 mg total) by mouth daily. (Patient not taking: Reported on 02/17/2022) 90 capsule 1   fluticasone (FLONASE) 50 MCG/ACT nasal spray USE 2 SPRAYS IN EACH NOSTRIL EVERY DAY (Patient not taking: Reported on 36/64/4034) 48 g 2   folic acid (FOLVITE) 1 MG tablet Take 2 mg by mouth daily. (Patient not taking: Reported on 02/17/2022)     furosemide (LASIX) 40 MG tablet TAKE 1 TABLET (40 MG TOTAL) BY MOUTH DAILY. (  Patient not taking: Reported on 02/17/2022) 90 tablet 10   gabapentin (NEURONTIN) 300 MG capsule Take 300 mg by mouth 3 (three) times daily. (Patient not taking: Reported on 02/17/2022)     Ginkgo Biloba (GINKOBA PO) Take 3,000 capsules by mouth daily in the afternoon. (Patient not taking: Reported on 02/17/2022)     Glucosamine 500 MG CAPS 1 capsule with a meal Orally Three times a day for 30 day(s) (Patient not taking: Reported on 02/17/2022)     glucose blood (TRUE METRIX BLOOD GLUCOSE TEST) test strip TEST BLOOD SUGAR EVERY DAY AND AS NEEDED (Patient not taking: Reported on 02/17/2022) 200 strip 4   Insulin Pen Needle (DROPLET PEN NEEDLES) 31G X 5 MM MISC USE TO INJECT INSULIN DAILY (Patient not taking: Reported on 02/17/2022) 100 each 3   lamoTRIgine (LAMICTAL) 200 MG tablet TAKE 1 TABLET (200 MG TOTAL) BY MOUTH DAILY AT 12 NOON. (Patient not taking: Reported on 02/17/2022) 90 tablet 10   levothyroxine (SYNTHROID) 50 MCG tablet TAKE 1 TABLET EVERY DAY (OFFICE VISIT NEEDED FOR FURTHER REFILLS) (Patient not taking: Reported on 02/17/2022) 90 tablet 3   lisinopril (ZESTRIL) 5 MG tablet TAKE 1 TABLET EVERY DAY (Patient not taking: Reported on 02/17/2022) 90 tablet 10   loratadine (CLARITIN) 10 MG tablet 1 tablet (Patient not taking: Reported on 02/17/2022)     metFORMIN (GLUCOPHAGE) 500 MG tablet TAKE 2 TABLETS EVERY MORNING  AND TAKE 1 TABLET EVERY EVENING (Patient not taking: Reported on  02/17/2022) 270 tablet 1   methotrexate 2.5 MG tablet Take 15 mg by mouth every Saturday. (Patient not taking: Reported on 02/17/2022)     Multiple Vitamins-Minerals (EYE VITAMINS PO) Take 2 tablets by mouth daily. (Patient not taking: Reported on 02/17/2022)     omeprazole (PRILOSEC) 20 MG capsule TAKE 1 CAPSULE (20 MG TOTAL) BY MOUTH DAILY. TAKE 30 MINUTES BEFORE A MEAL (Patient not taking: Reported on 02/17/2022) 90 capsule 10   oxybutynin (DITROPAN-XL) 10 MG 24 hr tablet Take 1 tablet (10 mg total) by mouth at bedtime. (Patient not taking: Reported on 02/17/2022) 90 tablet 3   Potassium Chloride ER 20 MEQ TBCR Take 1 tablet by mouth daily. (Patient not taking: Reported on 02/17/2022) 30 tablet 1   potassium citrate (UROCIT-K) 10 MEQ (1080 MG) SR tablet Take 1 tablet (10 mEq total) by mouth daily in the afternoon. (Patient not taking: Reported on 02/17/2022) 90 tablet 1   pravastatin (PRAVACHOL) 20 MG tablet Take 1 tablet (20 mg total) by mouth at bedtime. (Patient not taking: Reported on 02/17/2022) 90 tablet 3   predniSONE (DELTASONE) 5 MG tablet Take 5 mg by mouth daily with breakfast.  (Patient not taking: Reported on 02/17/2022)     sertraline (ZOLOFT) 100 MG tablet Take 1 tablet (100 mg total) by mouth daily. (Patient not taking: Reported on 02/17/2022) 90 tablet 0   sulfaSALAzine (AZULFIDINE) 500 MG tablet Take 2 tablets by mouth 2 (two) times daily. (Patient not taking: Reported on 02/17/2022)     tamsulosin (FLOMAX) 0.4 MG CAPS capsule TAKE 1 CAPSULE EVERY DAY (Patient not taking: Reported on 02/17/2022) 90 capsule 1   TRESIBA FLEXTOUCH 100 UNIT/ML FlexTouch Pen INJECT 26 UNITS UNDER THE SKIN DAILY (Patient not taking: Reported on 02/17/2022) 30 mL 2   No facility-administered medications prior to visit.    No Known Allergies ROS neg/noncontributory except as noted HPI/below      Objective:     BP (!) 160/90 (BP Location: Right Arm, Patient Position:  Sitting, Cuff Size: Large)    Pulse 74   Temp 98 F (36.7 C) (Temporal)   Ht '5\' 1"'$  (1.549 m)   Wt 214 lb 2 oz (97.1 kg)   SpO2 96%   BMI 40.46 kg/m  Wt Readings from Last 3 Encounters:  02/17/22 214 lb 2 oz (97.1 kg)  12/30/21 223 lb (101.2 kg)  12/29/21 221 lb 9.6 oz (100.5 kg)    Physical Exam   Gen: WDWN NAD HEENT: NCAT, conjunctiva not injected, sclera nonicteric TM WNL B, OP moist, no exudates  sinuses tender to percussion-all 4 NECK:  supple, no thyromegaly, no nodes, no carotid bruits CARDIAC: RRR, S1S2+, +ectopic beats LUNGS: CTAB. No wheezes ABDOMEN:  BS+, soft, NTND, No HSM, no masses EXT:  no edema MSK: no gross abnormalities.  NEURO: A&O x3.  CN II-XII intact.  PSYCH: normal mood. Good eye contact     Assessment & Plan:   Problem List Items Addressed This Visit       Cardiovascular and Mediastinum   Hypertension associated with diabetes ([place])     Endocrine   Poorly controlled type 2 diabetes mellitus with circulatory disorder ([place])   Other Visit Diagnoses     Acute non-recurrent maxillary sinusitis    -  Primary   Relevant Medications   cefTRIAXone (ROCEPHIN) injection 500 mg (Start on 02/17/2022  1:45 PM)   amoxicillin-clavulanate (AUGMENTIN) 250-62.5 MG/5ML suspension   Nausea and vomiting, unspecified vomiting type       Relevant Medications   ondansetron (ZOFRAN) injection 4 mg      Sinusitis-IM ceftriaxone '500mg'$  given in office.  Will do augmentin liquid d/t "trouble swallowing" '500mg'$  tid x 10days.  Worse, no change, let us know Nausea/vomiting-pt thinks d/t sinusitis.  Zofran '4mg'$  I'm given in office.  Zofran '4mg'$  ODT q 8hrs prn for home.  Sips of fluids frequently to stay hydrated.  Declined er for now(I feel stable enough so ok).  If worse, weak, can't keep fluids down, to ER HTN-bps elevated as no meds 2 wks.  Advised to take '10mg'$  lisinopril today. DM-sugars 190.  Advised to take tresiba at least 5 units and monitor sugars.   Try taking meds slowly, one at a time and  separate by 1 hr.    Meds ordered this encounter  Medications   cefTRIAXone (ROCEPHIN) injection 500 mg   ondansetron (ZOFRAN) injection 4 mg   amoxicillin-clavulanate (AUGMENTIN) 250-62.5 MG/5ML suspension    Sig: Take 10 mLs (500 mg total) by mouth 3 (three) times daily.    Dispense:  300 mL    Refill:  0   ondansetron (ZOFRAN-ODT) 4 MG disintegrating tablet    Sig: Take 1 tablet (4 mg total) by mouth every 8 (eight) hours as needed for nausea or vomiting.    Dispense:  20 tablet    Refill:  0    Wellington Hampshire, MD

## 2022-02-24 ENCOUNTER — Other Ambulatory Visit: Payer: Self-pay | Admitting: Family Medicine

## 2022-02-24 ENCOUNTER — Telehealth: Payer: Self-pay | Admitting: Family Medicine

## 2022-02-24 MED ORDER — ONDANSETRON 4 MG PO TBDP: 4.0000 mg | ORAL_TABLET | Freq: Three times a day (TID) | ORAL | 0 refills | Status: DC | PRN

## 2022-02-24 MED ORDER — AMOXICILLIN-POT CLAVULANATE 250-62.5 MG/5ML PO SUSR: 500.0000 mg | Freq: Three times a day (TID) | ORAL | 0 refills | Status: DC

## 2022-02-24 NOTE — Telephone Encounter (Signed)
Patient states: - She has finished augmentin,prescribed by PCP on 11/15 for sinus infection - She is still having symptoms    Patient requests: - Refill of augmentin and zofran     LAST APPOINTMENT DATE:  02/17/22  NEXT APPOINTMENT DATE:   MEDICATION: ondansetron (ZOFRAN-ODT) 4 MG disintegrating tablet  amoxicillin-clavulanate (AUGMENTIN) 250-62.5 MG/5ML suspension   Is the patient out of medication? Yes  PHARMACY: [facility], [address], [place] [address] Phone: [phone]  Fax: [phone]

## 2022-03-16 NOTE — Progress Notes (Signed)
Chronic Care Management Pharmacy Note  03/23/2022 Name:  Brittany Acosta MRN:  [id number] DOB:  02/21/1951  Summary: PharmD FU visit she is still having some nausea and occasionally vomiting.  Good news was she was able to start all of her medications back and her BP and glucose has been controlled.  One episode of hypoglycemia in the 70s but just the one occurrence.  Recommendations/Changes made from today's visit: No changes FU A1c in 6 months DUE FOR AWV   Plan: Fu with PharmD 6 months CMA FU late January to assess A1c   Subjective: Brittany Acosta is an 71 y.o. year old female who is a primary patient of Tawnya Crook, MD.  The CCM team was consulted for assistance with disease management and care coordination needs.    Engaged with patient by telephone for follow up visit in response to provider referral for pharmacy case management and/or care coordination services.   Consent to Services:  The patient was given the following information about Chronic Care Management services today, agreed to services, and gave verbal consent: 1. CCM service includes personalized support from designated clinical staff supervised by the primary care provider, including individualized plan of care and coordination with other care providers 2. 24/7 contact phone numbers for assistance for urgent and routine care needs. 3. Service will only be billed when office clinical staff spend 20 minutes or more in a month to coordinate care. 4. Only one practitioner may furnish and bill the service in a calendar month. 5.The patient may stop CCM services at any time (effective at the end of the month) by phone call to the office staff. 6. The patient will be responsible for cost sharing (co-pay) of up to 20% of the service fee (after annual deductible is met). Patient agreed to services and consent obtained.  Patient Care Team: Tawnya Crook, MD as PCP - General (Family Medicine) Buford Dresser, MD as  PCP - Cardiology (Cardiology) Renato Shin, MD (Inactive) as Consulting Physician (Endocrinology) Frederik Pear, MD as Consulting Physician (Orthopedic Surgery) Juanita Craver, MD as Consulting Physician (Gastroenterology) Melida Quitter, MD as Consulting Physician (Otolaryngology) Brand Males, MD (Pulmonary Disease) Harriet Masson, DPM (Podiatry) Earlean Polka, MD (Ophthalmology) Norma Fredrickson, MD (Psychiatry) Tania Ade, MD (Orthopedic Surgery) Nicholas Lose, MD as Consulting Physician (Hematology and Oncology) Sanda Klein as Radiation Oncologist (Radiology) Lahoma Rocker, MD as Consulting Physician (Rheumatology) Gerda Diss, DO as Referring Physician (Sports Medicine) Edythe Clarity, [facility] as Pharmacist (Pharmacist)  Recent office visits:  09/18/2021 OV (PCP) Tawnya Crook, MD; Will renew Flexeril at 10 mg p.o. at at bedtime.  (She was taking 2 of the 66m tabs).  Is getting a lot of low sugars.  A1c in May was 6.7.  Advised to continue metformin 1000 mg twice daily.  Decrease Tresiba to 23 units.  Monitor sugars.  Advised to occasionally check later in the day.  If continues to be low, decreased by 3 units again, but let me know.    08/24/2021 OV (Fam Med) WInda Coke PA; Will have her hold her biotin and then we will update blood work in 3 days or so Update TSH and ferritin specifically Discussed that I would not start estrogen for thinning hair due to hx of breast cancer   Recent consult visits:  09/22/2021 OV (Podiatry) HNesco [place], DPM; no medication changes indicated.   Hospital visits:  None in previous 6 months   Objective:  Lab Results  Component Value Date   CREATININE 0.65 12/30/2021   BUN 10 12/30/2021   GFR 88.60 12/30/2021   EGFR 90 06/16/2021   GFRNONAA >60 11/22/2021   GFRAA >60 09/14/2019   NA 136 12/30/2021   K 4.1 01/18/2022   CALCIUM 8.7 12/30/2021   CO2 30 12/30/2021   GLUCOSE 112 (H) 12/30/2021    Lab Results   Component Value Date/Time   HGBA1C 6.9 (H) 12/30/2021 10:57 AM   HGBA1C 6.7 (H) 09/01/2021 10:46 AM   FRUCTOSAMINE 237 08/27/2010 02:52 PM   GFR 88.60 12/30/2021 10:57 AM   GFR 89.48 08/28/2021 10:59 AM   MICROALBUR 1.4 08/28/2021 10:59 AM   MICROALBUR <0.7 06/26/2015 12:13 PM    Last diabetic Eye exam:  Lab Results  Component Value Date/Time   HMDIABEYEEXA No Retinopathy 06/03/2021 12:00 AM    Last diabetic Foot exam: No results found for: "HMDIABFOOTEX"   Lab Results  Component Value Date   CHOL 135 08/28/2021   HDL 60.60 08/28/2021   LDLCALC 61 08/28/2021   LDLDIRECT 56.4 06/11/2011   TRIG 67.0 08/28/2021   CHOLHDL 2 08/28/2021       Latest Ref Rng & Units 12/30/2021   10:57 AM 08/28/2021   10:59 AM 06/16/2021   12:00 AM  Hepatic Function  Total Protein 6.0 - 8.3 g/dL 6.8  6.4    Albumin 3.5 - 5.2 g/dL 3.9  4.0  4.6      AST 0 - 37 U/L _0 ALT 0 - 35 U/L _1 Alk Phosphatase 39 - 117 U/L 103  106  122      Total Bilirubin 0.2 - 1.2 mg/dL 0.3  0.4       This result is from an external source.    Lab Results  Component Value Date/Time   TSH 2.67 08/28/2021 10:59 AM   TSH 2.93 02/27/2020 10:52 AM       Latest Ref Rng & Units 12/30/2021   10:57 AM 11/22/2021    2:26 PM 09/07/2021    9:55 AM  CBC  WBC 4.0 - 10.5 K/uL 10.3  9.8  7.5   Hemoglobin 12.0 - 15.0 g/dL 10.7  10.8  11.0   Hematocrit 36.0 - 46.0 % 32.6  33.2  33.7   Platelets 150.0 - 400.0 K/uL 458.0  435  407.0     Lab Results  Component Value Date/Time   VD25OH 46 03/19/2013 01:41 PM   VD25OH 59 08/27/2010 02:52 PM    Clinical ASCVD: Yes  The 10-year ASCVD risk score (Arnett DK, et al., 2019) is: 33.4%   Values used to calculate the score:     Age: 89 years     Sex: Female     Is Non-Hispanic African American: No     Diabetic: Yes     Tobacco smoker: No     Systolic Blood Pressure: 681 mmHg     Is BP treated: Yes     HDL Cholesterol: 60.6 mg/dL     Total  Cholesterol: 135 mg/dL       10/30/2021    2:15 PM 06/24/2020   10:06 AM 02/27/2020   10:26 AM  Depression screen PHQ 2/9  Decreased Interest 3 0 0  Down, Depressed, Hopeless 1 0 0  PHQ - 2 Score 4 0 0  Altered sleeping 3    Tired, decreased energy 3    Change  in appetite 3    Feeling bad or failure about yourself  1    Trouble concentrating 1    Moving slowly or fidgety/restless 0    Suicidal thoughts 0    PHQ-9 Score 15    Difficult doing work/chores Very difficult        Social History   Tobacco Use  Smoking Status Former   Packs/day: 1.00   Years: 6.00   Total pack years: 6.00   Types: Cigarettes   Quit date: 04/06/1983   Years since quitting: 38.9  Smokeless Tobacco Never  Tobacco Comments   Married, lives with spouse. Disable- 2 grown kids-6 g-kids   BP Readings from Last 3 Encounters:  02/17/22 (!) 160/90  12/30/21 (!) 140/78  12/29/21 (!) 140/80   Pulse Readings from Last 3 Encounters:  02/17/22 74  12/30/21 93  12/29/21 76   Wt Readings from Last 3 Encounters:  02/17/22 214 lb 2 oz (97.1 kg)  12/30/21 223 lb (101.2 kg)  12/29/21 221 lb 9.6 oz (100.5 kg)   BMI Readings from Last 3 Encounters:  02/17/22 40.46 kg/m  12/30/21 42.14 kg/m  12/29/21 41.87 kg/m    Assessment/Interventions: Review of patient past medical history, allergies, medications, health status, including review of consultants reports, laboratory and other test data, was performed as part of comprehensive evaluation and provision of chronic care management services.   SDOH:  (Social Determinants of Health) assessments and interventions performed: Yes  Financial Resource Strain: Not on file   Food Insecurity: Not on file    SDOH Screenings   Depression (PHQ2-9): High Risk (10/30/2021)  Tobacco Use: Medium Risk (02/17/2022)    CCM Care Plan  No Known Allergies  Medications Reviewed Today     Reviewed by Edythe Clarity, [facility] (Pharmacist) on 03/23/22 at 1557  Med List  Status: <None>   Medication Order Taking? Sig Documenting Provider Last Dose Status Informant  allopurinol (ZYLOPRIM) 300 MG tablet [id number] Yes Take 1 tablet (300 mg total) by mouth daily. Tawnya Crook, MD Taking Active   amoxicillin-clavulanate (AUGMENTIN) 250-62.5 MG/5ML suspension [id number] Yes Take 10 mLs (500 mg total) by mouth 3 (three) times daily. Tawnya Crook, MD Taking Active   anastrozole (ARIMIDEX) 1 MG tablet [id number] Yes TAKE 1 TABLET EVERY DAY Tawnya Crook, MD Taking Active   aspirin EC 81 MG tablet [id number] Yes Take 1 tablet (81 mg total) by mouth daily. Swallow whole. Buford Dresser, MD Taking Active   Biotin 10000 MCG TABS [id number] Yes Take 1 tablet by mouth daily in the afternoon. [provider] Taking Active   Black Elderberry,Berry-Flower, [place] [id number] Yes  [provider] Taking Active   Cholecalciferol 25 MCG (1000 UT) CHEW [id number] Yes  [provider] Taking Active   Chromium-Cinnamon (CINNAMON PLUS CHROMIUM PO) [id number] Yes Take 2 tablets by mouth daily. [provider] Taking Active   cyclobenzaprine (FLEXERIL) 10 MG tablet [id number] Yes TAKE 1 TABLET (10 MG TOTAL) BY MOUTH AT BEDTIME AS NEEDED FOR MUSCLE SPASMS. Tawnya Crook, MD Taking Active   doxepin [facility]) 10 MG capsule [id number] Yes Take 1 capsule (10 mg total) by mouth daily. Tawnya Crook, MD Taking Active   fluticasone [facility]) 50 MCG/ACT nasal spray [id number] Yes USE 2 SPRAYS IN [facility] NOSTRIL EVERY DAY Tawnya Crook, MD Taking Active   folic acid (FOLVITE) 1 MG tablet [id number] Yes Take 2 mg by mouth daily. [provider] Taking Active Self  furosemide (LASIX)  40 MG tablet [id number] Yes TAKE 1 TABLET (40 MG TOTAL) BY MOUTH DAILY. Tawnya Crook, MD Taking Active   gabapentin (NEURONTIN) 300 MG capsule [id number] Yes Take 300 mg by mouth 3 (three) times daily. [provider] Taking Active   Ginkgo Biloba  (GINKOBA PO) [id number] Yes Take 3,000 capsules by mouth daily in the afternoon. [provider] Taking Active   Glucosamine 500 MG CAPS [id number] Yes  [provider] Taking Active   glucose blood (TRUE METRIX BLOOD GLUCOSE TEST) test strip [id number] Yes TEST BLOOD SUGAR EVERY DAY AND AS NEEDED Tawnya Crook, MD Taking Active   Insulin Pen Needle (DROPLET PEN NEEDLES) 31G X 5 MM MISC [id number] Yes USE TO INJECT INSULIN DAILY Inda Coke, PA Taking Active Self  lamoTRIgine (LAMICTAL) 200 MG tablet [id number] Yes TAKE 1 TABLET (200 MG TOTAL) BY MOUTH DAILY AT 12 NOON. Tawnya Crook, MD Taking Active   levothyroxine (SYNTHROID) 50 MCG tablet [id number] Yes TAKE 1 TABLET EVERY DAY (OFFICE VISIT NEEDED FOR FURTHER REFILLS) Tawnya Crook, MD Taking Active   lisinopril (ZESTRIL) 5 MG tablet [id number] Yes TAKE 1 TABLET EVERY DAY Tawnya Crook, MD Taking Active   loratadine (CLARITIN) 10 MG tablet [id number] Yes  [provider] Taking Active   metFORMIN (GLUCOPHAGE) 500 MG tablet [id number] Yes TAKE 2 TABLETS EVERY MORNING  AND TAKE 1 TABLET EVERY EVENING Tawnya Crook, MD Taking Active   methotrexate 2.5 MG tablet [id number] Yes Take 15 mg by mouth every Saturday. [provider] Taking Active Self  Multiple Vitamins-Minerals (EYE VITAMINS PO) [id number] Yes Take 2 tablets by mouth daily. [provider] Taking Active   omeprazole (PRILOSEC) 20 MG capsule [id number] Yes TAKE 1 CAPSULE (20 MG TOTAL) BY MOUTH DAILY. TAKE 30 MINUTES BEFORE A MEAL Tawnya Crook, MD Taking Active   ondansetron (ZOFRAN-ODT) 4 MG disintegrating tablet [id number] Yes Take 1 tablet (4 mg total) by mouth every 8 (eight) hours as needed for nausea or vomiting. Tawnya Crook, MD Taking Active   oxybutynin (DITROPAN-XL) 10 MG 24 hr tablet [id number] Yes Take 1 tablet (10 mg total) by mouth at bedtime. Tawnya Crook, MD Taking Active   Potassium Chloride ER 20 MEQ TBCR  [id number] Yes Take 1 tablet by mouth daily. Tawnya Crook, MD Taking Active   potassium citrate (UROCIT-K) 10 MEQ (1080 MG) SR tablet [id number] Yes Take 1 tablet (10 mEq total) by mouth daily in the afternoon. Tawnya Crook, MD Taking Active   pravastatin (PRAVACHOL) 20 MG tablet [id number] Yes Take 1 tablet (20 mg total) by mouth at bedtime. Tawnya Crook, MD Taking Active   predniSONE (DELTASONE) 5 MG tablet [id number] Yes Take 5 mg by mouth daily with breakfast. [provider] Taking Active Self  sertraline (ZOLOFT) 100 MG tablet [id number] Yes Take 1 tablet (100 mg total) by mouth daily. Tawnya Crook, MD Taking Active   sulfaSALAzine (AZULFIDINE) 500 MG tablet [id number] Yes Take 2 tablets by mouth 2 (two) times daily. [provider] Taking Active   tamsulosin (FLOMAX) 0.4 MG CAPS capsule [id number] Yes TAKE 1 CAPSULE EVERY DAY Morene Rankins [place], [place] Taking Active   TRESIBA FLEXTOUCH 100 UNIT/ML FlexTouch Pen [id number] Yes INJECT 26 UNITS UNDER THE SKIN DAILY Tawnya Crook, MD Taking Active             Patient Active Problem List   Diagnosis Date Noted   Psoriatic arthropathy ([place]) 09/20/2021   Carpal tunnel  syndrome of left wrist 07/02/2020   Pain of left hand 07/02/2020   Rheumatoid arthritis ([place]) 05/07/2020   Inflammatory polyarthritis ([place]) 05/07/2020   Bilateral leg edema 01/25/2020   Family history of heart disease 01/25/2020   Essential hypertension 01/25/2020   Diabetes mellitus type 2 in obese ([place]) 01/25/2020   Right carotid bruit 12/25/2018   Stress incontinence in female 12/06/2018   Antalgic gait 12/02/2018   Malignant neoplasm of upper-outer quadrant of left breast in female, estrogen receptor positive ([place]) 05/10/2018   Temporomandibular joint (TMJ) pain 03/23/2018   Tophus of toe concurrent with and due to gout 12/16/2017   Mass in the abdomen 03/17/2017   Degenerative lumbar spinal stenosis 02/10/2017   Spondylolisthesis at L4-L5  level 02/10/2017   Cervical radiculopathy 01/18/2017   Nausea 12/23/2016   Depression, major, recurrent, moderate (HCC) 11/19/2016   Diabetic neuropathy, painful ([place]) 11/19/2016   Hypothyroidism, 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/19/2016   OSA (obstructive sleep apnea) 11/19/2016   PTSD (post-traumatic stress disorder) 11/19/2016   Chronic low back pain 11/19/2016   Gout, 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   Poorly controlled type 2 diabetes mellitus with circulatory disorder ([place]) 06/10/2015   Right bundle branch block 04/18/2015   Bifascicular block 04/18/2015   Radiculopathy, lumbar region 05/31/2014   Greater trochanteric bursitis of left hip 04/22/2014   Achilles tendinosis 02/19/2014   Gait instability 11/14/2013   Recurrent falls 11/14/2013   Multiple fractures 11/14/2013   Calculus of kidney 07/25/2013   Hypercalciuria 07/25/2013   Pain in joint involving ankle and foot 07/02/2013   Peripheral polyneuropathy 09/13/2012   Migraine 07/10/2012   Osteoarthritis of right knee 02/10/2012   Osteoarthritis of left knee 06/21/2011   GERD (gastroesophageal reflux disease) 02/09/2011   Vitamin D deficiency 08/27/2010   Fibromyalgia 03/23/2010   Goiter 03/24/2009   Edema 09/06/2008   Morbid (severe) obesity due to excess calories ([place]) 09/03/2008   Hypercholesterolemia 06/10/2008   Bipolar 1 disorder ([place]) 06/10/2008   Hypertension associated with diabetes ([place]) 06/10/2008   Allergic rhinitis 06/10/2008    Immunization History  Administered Date(s) Administered   Fluad Quad(high Dose 65+) 01/22/2021, 12/30/2021   Influenza Split 04/23/2011, 01/05/2012   Influenza Whole 01/08/2008, 01/13/2009   Influenza, High Dose Seasonal PF 12/15/2015, 12/15/2016, 01/07/2018   Influenza,inj,Quad PF,6+ Mos 12/06/2012, 12/18/2014   Influenza-Unspecified 04/23/2011, 01/05/2012, 12/06/2012, 12/18/2014, 01/16/2020   PFIZER(Purple Top)SARS-COV-2 Vaccination 06/14/2019, 07/10/2019, 12/21/2019   Pneumococcal Conjugate-13  08/08/2017   Pneumococcal Polysaccharide-23 04/23/2011, 06/24/2020   Td 04/05/2004, 04/30/2013, 09/14/2017   Tdap 09/13/2017   Zoster Recombinat (Shingrix) 09/14/2017, 12/02/2017   Zoster, Live 09/07/2013   Zoster, Unspecified 12/02/2017    Conditions to be addressed/monitored:  HTN, GERD, Type II DM w/ neuropathy, HLD, Depression/Bipolar  Care Plan : General Pharmacy (Adult)  Updates made by Edythe Clarity, RPH since 03/23/2022 12:00 AM     Problem: HTN, GERD, Type II DM w/ neuropathy, HLD, Depression/Bipolar   Priority: High  Onset Date: 09/29/2021     Long-Range Goal: Patient-Specific Goal   Start Date: 09/29/2021  Expected End Date: 03/31/2022  Recent Progress: On track  Priority: High  Note:   Current Barriers:  Nausea/vomiting  Pharmacist Clinical Goal(s):  Patient will achieve improvement in glucose control as evidenced by glucose logs through collaboration with PharmD and provider.   Interventions: 1:1 collaboration with Tawnya Crook, MD regarding development and update of comprehensive plan of care as evidenced by provider attestation and co-signature Inter-disciplinary care team collaboration (see longitudinal plan of care) Comprehensive medication  review performed; medication list updated in electronic medical record  Hypertension (BP goal <140/90) 03/23/22 -Controlled, patient back taking her medication and reports her home readings are "normal" -Current treatment: Lisinopril 37m Appropriate, Effective, Safe, Accessible -Medications previously tried: HCTZ  -Current home readings: reports normal, no logs discussed -Current dietary habits: same, she is having some nausea right now which is effecting her diet -Current exercise habits: same unchanged -Denies hypotensive/hypertensive symptoms -Educated on BP goals and benefits of medications for prevention of heart attack, stroke and kidney damage; Exercise goal of 150 minutes per week; Importance of home  blood pressure monitoring; -She did report she was back taking all of her medications as she was before the nausea, etc. -Advised her to continue to monitor BP at home, stay hydrated with ongoing nausea and occasionally vomiting.   Hyperlipidemia: (LDL goal < 70) -Controlled -Current treatment: Pravastatin 248mAppropriate, Effective, Safe, Accessible -Medications previously tried: none noted  -Current dietary patterns: see HTN -Current exercise habits: see HTN -Educated on Cholesterol goals;  Benefits of statin for ASCVD risk reduction; Importance of limiting foods high in cholesterol; -Recommended to continue current medication Most recent LDL well controlled, tolerating medication well.  No need to change at this time.  Diabetes (A1c goal <7%) 03/23/22 -Controlled, back taking medications -Current medications: Metformin 50090m tablet qam and 1 tablet qpm Appropriate, Effective, Safe, Accessible Tresiba 23 units Appropriate, Effective, Query Safe,  Gabapentin 300m34md Appropriate, Effective, Safe, Accessible -Medications previously tried: Novolog, Levemir  -Current home glucose readings fasting glucose: "normal: denies any hypoglycemia post prandial glucose: does not usually check -Denies hypoglycemic/hyperglycemic symptoms -Current exercise: minimal, see HTN -Educated on A1c and blood sugar goals; Complications of diabetes including kidney damage, retinal damage, and cardiovascular disease; Exercise goal of 150 minutes per week; Prevention and management of hypoglycemic episodes; Benefits of routine self-monitoring of blood sugar; -Counseled to check feet daily and get yearly eye exams -Happy she is back on all of her medications for her glucose.  She reports her sugar has been normal and she has had one episode of low glucose in the 70s but that was the only time. No changes at this time, RTC in January for A1c recheck.  Depression/Bipolar (Goal: Improve sleep,  mood) -Controlled, not assessed -Current treatment: Sertraline 100mg43mly Appropriate, Effective, Safe, Accessible Lamotrigine 200mg 95my Appropriate, Effective, Safe, Accessible Doxepin 10mg d10m Appropriate, Effective, Safe, Accessible -Medications previously tried/failed: mirtazapine, Effexor, venlafaxine -PHQ9:     06/24/2020   10:06 AM 02/27/2020   10:26 AM 12/28/2019    2:23 PM  PHQ9 SCORE ONLY  PHQ-9 Total Score 0 0 13  -Educated on Benefits of medication for symptom control She does mention sleep is an ongoing concern she sometimes does not get in bed until 2 am and is up by 6am.  She is tired sometimes during the day, but does think this interferes with her day to day activities. -Recommended to continue current medication Will consult with PCP on sleep disturbances.  Advised her to write down her thoughts if her mind is racing at night.  GERD (Goal: Minimize symptoms) -Controlled, not assessed -Current treatment  Omeprazole 20mg Ap36mriate, Effective, Safe, Accessible -Medications previously tried: none noted -Taking appropriately on an empty stomach.  Denies any symptoms of GERD at this time.  -Recommended to continue current medication  Hypothyroidism (Goal: Maintain TSH) -Controlled, not assessed -Current treatment  Levothyroxine 50mcg Ap22mriate, Effective, Safe, Accessible -Medications previously tried: none noted -Taking appropriately, TSH is well controlled.  -  Recommended to continue current medication  Patient Goals/Self-Care Activities Patient will:  - take medications as prescribed as evidenced by patient report and record review focus on medication adherence by pill box check blood pressure a few times per week, document, and provide at future appointments  Follow Up Plan: The care management team will reach out to the patient again over the next 6 months.             Medication Assistance: None required.  Patient affirms current coverage  meets needs.  Compliance/Adherence/Medication fill history: Care Gaps: Due for AWV  Star-Rating Drugs: Lisinopril 5 mg last filled 01/23/22 90ds Metformin 500 mg last filled 03/14/22 Pravastatin 20 mg last filled 01/23/22  Patient's preferred pharmacy is:  [facility], [place] [address] Phone: [phone] Fax: [phone]  [place], [place] [place] [address] Phone: [phone] Fax: [phone]  Uses pill box? Yes Pt endorses 100% compliance  We discussed: Current pharmacy is preferred with insurance plan and patient is satisfied with pharmacy services Patient decided to: Continue current medication management strategy  Care Plan and Follow Up Patient Decision:  Patient agrees to Care Plan and Follow-up.  Plan: The care management team will reach out to the patient again over the next 180 days.  Beverly Milch, PharmD Clinical Pharmacist  [facility] [phone]

## 2022-03-23 ENCOUNTER — Ambulatory Visit: Payer: Medicare PPO | Admitting: Pharmacist

## 2022-03-23 DIAGNOSIS — E1169 Type 2 diabetes mellitus with other specified complication: Secondary | ICD-10-CM

## 2022-03-23 DIAGNOSIS — I152 Hypertension secondary to endocrine disorders: Secondary | ICD-10-CM

## 2022-03-23 NOTE — Patient Instructions (Addendum)
Visit Information   Goals Addressed             This Visit's Progress    Monitor and Manage My Blood Sugar-Diabetes Type 2   On track    Timeframe:  Long-Range Goal Priority:  High Start Date: 09/29/21                            Expected End Date: 03/31/22                      Follow Up Date 12/30/21    - check blood sugar at prescribed times - check blood sugar before and after exercise - check blood sugar if I feel it is too high or too low    Why is this important?   Checking your blood sugar at home helps to keep it from getting very high or very low.  Writing the results in a diary or log helps the doctor know how to care for you.  Your blood sugar log should have the time, date and the results.  Also, write down the amount of insulin or other medicine that you take.  Other information, like what you ate, exercise done and how you were feeling, will also be helpful.     Notes:        Patient Care Plan: General Pharmacy (Adult)     Problem Identified: HTN, GERD, Type II DM w/ neuropathy, HLD, Depression/Bipolar   Priority: High  Onset Date: 09/29/2021     Long-Range Goal: Patient-Specific Goal   Start Date: 09/29/2021  Expected End Date: 03/31/2022  Recent Progress: On track  Priority: High  Note:   Current Barriers:  Nausea/vomiting  Pharmacist Clinical Goal(s):  Patient will achieve improvement in glucose control as evidenced by glucose logs through collaboration with PharmD and provider.   Interventions: 1:1 collaboration with Tawnya Crook, MD regarding development and update of comprehensive plan of care as evidenced by provider attestation and co-signature Inter-disciplinary care team collaboration (see longitudinal plan of care) Comprehensive medication review performed; medication list updated in electronic medical record  Hypertension (BP goal <140/90) 03/23/22 -Controlled, patient back taking her medication and reports her home readings are  "normal" -Current treatment: Lisinopril '5mg'$  Appropriate, Effective, Safe, Accessible -Medications previously tried: HCTZ  -Current home readings: reports normal, no logs discussed -Current dietary habits: same, she is having some nausea right now which is effecting her diet -Current exercise habits: same unchanged -Denies hypotensive/hypertensive symptoms -Educated on BP goals and benefits of medications for prevention of heart attack, stroke and kidney damage; Exercise goal of 150 minutes per week; Importance of home blood pressure monitoring; -She did report she was back taking all of her medications as she was before the nausea, etc. -Advised her to continue to monitor BP at home, stay hydrated with ongoing nausea and occasionally vomiting.   Hyperlipidemia: (LDL goal < 70) -Controlled -Current treatment: Pravastatin '20mg'$  Appropriate, Effective, Safe, Accessible -Medications previously tried: none noted  -Current dietary patterns: see HTN -Current exercise habits: see HTN -Educated on Cholesterol goals;  Benefits of statin for ASCVD risk reduction; Importance of limiting foods high in cholesterol; -Recommended to continue current medication Most recent LDL well controlled, tolerating medication well.  No need to change at this time.  Diabetes (A1c goal <7%) 03/23/22 -Controlled, back taking medications -Current medications: Metformin '500mg'$  2 tablet qam and 1 tablet qpm Appropriate, Effective, Safe, Accessible Tresiba 23 units  Appropriate, Effective, Query Safe,  Gabapentin '300mg'$  tid Appropriate, Effective, Safe, Accessible -Medications previously tried: Novolog, Levemir  -Current home glucose readings fasting glucose: "normal: denies any hypoglycemia post prandial glucose: does not usually check -Denies hypoglycemic/hyperglycemic symptoms -Current exercise: minimal, see HTN -Educated on A1c and blood sugar goals; Complications of diabetes including kidney damage, retinal  damage, and cardiovascular disease; Exercise goal of 150 minutes per week; Prevention and management of hypoglycemic episodes; Benefits of routine self-monitoring of blood sugar; -Counseled to check feet daily and get yearly eye exams -Happy she is back on all of her medications for her glucose.  She reports her sugar has been normal and she has had one episode of low glucose in the 70s but that was the only time. No changes at this time, RTC in January for A1c recheck.  Depression/Bipolar (Goal: Improve sleep, mood) -Controlled, not assessed -Current treatment: Sertraline '100mg'$  daily Appropriate, Effective, Safe, Accessible Lamotrigine '200mg'$  daily Appropriate, Effective, Safe, Accessible Doxepin '10mg'$  daily Appropriate, Effective, Safe, Accessible -Medications previously tried/failed: mirtazapine, Effexor, venlafaxine -PHQ9:     06/24/2020   10:06 AM 02/27/2020   10:26 AM 12/28/2019    2:23 PM  PHQ9 SCORE ONLY  PHQ-9 Total Score 0 0 13  -Educated on Benefits of medication for symptom control She does mention sleep is an ongoing concern she sometimes does not get in bed until 2 am and is up by 6am.  She is tired sometimes during the day, but does think this interferes with her day to day activities. -Recommended to continue current medication Will consult with PCP on sleep disturbances.  Advised her to write down her thoughts if her mind is racing at night.  GERD (Goal: Minimize symptoms) -Controlled, not assessed -Current treatment  Omeprazole '20mg'$  Appropriate, Effective, Safe, Accessible -Medications previously tried: none noted -Taking appropriately on an empty stomach.  Denies any symptoms of GERD at this time.  -Recommended to continue current medication  Hypothyroidism (Goal: Maintain TSH) -Controlled, not assessed -Current treatment  Levothyroxine 36mg Appropriate, Effective, Safe, Accessible -Medications previously tried: none noted -Taking appropriately, TSH is well  controlled.  -Recommended to continue current medication  Patient Goals/Self-Care Activities Patient will:  - take medications as prescribed as evidenced by patient report and record review focus on medication adherence by pill box check blood pressure a few times per week, document, and provide at future appointments  Follow Up Plan: The care management team will reach out to the patient again over the next 6 months.            The patient verbalized understanding of instructions, educational materials, and care plan provided today and DECLINED offer to receive copy of patient instructions, educational materials, and care plan.  Telephone follow up appointment with pharmacy team member scheduled for: 6 months  CEdythe Clarity [place] PharmD Clinical Pharmacist  [facility]([phone]

## 2022-03-24 ENCOUNTER — Other Ambulatory Visit: Payer: Self-pay | Admitting: Physician Assistant

## 2022-03-30 ENCOUNTER — Ambulatory Visit: Payer: Medicare PPO | Admitting: Podiatry

## 2022-03-30 DIAGNOSIS — M2041 Other hammer toe(s) (acquired), right foot: Secondary | ICD-10-CM

## 2022-03-30 DIAGNOSIS — E1142 Type 2 diabetes mellitus with diabetic polyneuropathy: Secondary | ICD-10-CM | POA: Diagnosis not present

## 2022-03-30 DIAGNOSIS — M2042 Other hammer toe(s) (acquired), left foot: Secondary | ICD-10-CM | POA: Diagnosis not present

## 2022-04-09 ENCOUNTER — Other Ambulatory Visit: Payer: Self-pay

## 2022-04-09 ENCOUNTER — Emergency Department (HOSPITAL_COMMUNITY)
Admission: EM | Admit: 2022-04-09 | Discharge: 2022-04-09 | Disposition: A | Discharge status: Home / Self Care | Payer: PPO | Attending: Emergency Medicine | Admitting: Emergency Medicine

## 2022-04-09 ENCOUNTER — Encounter (HOSPITAL_COMMUNITY): Payer: Self-pay

## 2022-04-09 ENCOUNTER — Emergency Department (HOSPITAL_COMMUNITY): Payer: PPO

## 2022-04-09 ENCOUNTER — Ambulatory Visit (HOSPITAL_BASED_OUTPATIENT_CLINIC_OR_DEPARTMENT_OTHER): Payer: PPO | Admitting: Cardiology

## 2022-04-09 ENCOUNTER — Telehealth: Payer: Self-pay | Admitting: Gastroenterology

## 2022-04-09 DIAGNOSIS — K625 Hemorrhage of anus and rectum: Secondary | ICD-10-CM | POA: Diagnosis present

## 2022-04-09 DIAGNOSIS — R112 Nausea with vomiting, unspecified: Secondary | ICD-10-CM | POA: Diagnosis not present

## 2022-04-09 DIAGNOSIS — R197 Diarrhea, unspecified: Secondary | ICD-10-CM | POA: Diagnosis not present

## 2022-04-09 DIAGNOSIS — R109 Unspecified abdominal pain: Secondary | ICD-10-CM | POA: Diagnosis not present

## 2022-04-09 DIAGNOSIS — K529 Noninfective gastroenteritis and colitis, unspecified: Secondary | ICD-10-CM | POA: Diagnosis not present

## 2022-04-09 DIAGNOSIS — K921 Melena: Secondary | ICD-10-CM | POA: Diagnosis not present

## 2022-04-09 DIAGNOSIS — Z7982 Long term (current) use of aspirin: Secondary | ICD-10-CM | POA: Diagnosis not present

## 2022-04-09 DIAGNOSIS — K6389 Other specified diseases of intestine: Secondary | ICD-10-CM | POA: Diagnosis not present

## 2022-04-09 DIAGNOSIS — K922 Gastrointestinal hemorrhage, unspecified: Secondary | ICD-10-CM | POA: Diagnosis not present

## 2022-04-09 LAB — CBC WITH DIFFERENTIAL/PLATELET
Abs Immature Granulocytes: 0.16 10*3/uL — ABNORMAL HIGH (ref 0.00–0.07)
Basophils Absolute: 0.1 10*3/uL (ref 0.0–0.1)
Basophils Relative: 0 %
Eosinophils Absolute: 0.1 10*3/uL (ref 0.0–0.5)
Eosinophils Relative: 0 %
HCT: 33.4 % — ABNORMAL LOW (ref 36.0–46.0)
Hemoglobin: 10.7 g/dL — ABNORMAL LOW (ref 12.0–15.0)
Immature Granulocytes: 1 %
Lymphocytes Relative: 9 %
Lymphs Abs: 2.2 10*3/uL (ref 0.7–4.0)
MCH: 27.8 pg (ref 26.0–34.0)
MCHC: 32 g/dL (ref 30.0–36.0)
MCV: 86.8 fL (ref 80.0–100.0)
Monocytes Absolute: 1.1 10*3/uL — ABNORMAL HIGH (ref 0.1–1.0)
Monocytes Relative: 5 %
Neutro Abs: 19.7 10*3/uL — ABNORMAL HIGH (ref 1.7–7.7)
Neutrophils Relative %: 85 %
Platelets: 483 10*3/uL — ABNORMAL HIGH (ref 150–400)
RBC: 3.85 MIL/uL — ABNORMAL LOW (ref 3.87–5.11)
RDW: 16.7 % — ABNORMAL HIGH (ref 11.5–15.5)
WBC: 23.2 10*3/uL — ABNORMAL HIGH (ref 4.0–10.5)
nRBC: 0 % (ref 0.0–0.2)

## 2022-04-09 LAB — COMPREHENSIVE METABOLIC PANEL
ALT: 13 U/L (ref 0–44)
AST: 19 U/L (ref 15–41)
Albumin: 3.2 g/dL — ABNORMAL LOW (ref 3.5–5.0)
Alkaline Phosphatase: 74 U/L (ref 38–126)
Anion gap: 13 (ref 5–15)
BUN: 7 mg/dL — ABNORMAL LOW (ref 8–23)
CO2: 28 mmol/L (ref 22–32)
Calcium: 8.6 mg/dL — ABNORMAL LOW (ref 8.9–10.3)
Chloride: 92 mmol/L — ABNORMAL LOW (ref 98–111)
Creatinine, Ser: 0.77 mg/dL (ref 0.44–1.00)
GFR, Estimated: >60 mL/min (ref >60)
Glucose, Bld: 128 mg/dL — ABNORMAL HIGH (ref 70–99)
Potassium: 3.1 mmol/L — ABNORMAL LOW (ref 3.5–5.1)
Sodium: 133 mmol/L — ABNORMAL LOW (ref 135–145)
Total Bilirubin: 0.4 mg/dL (ref 0.3–1.2)
Total Protein: 6.4 g/dL — ABNORMAL LOW (ref 6.5–8.1)

## 2022-04-09 LAB — LIPASE, BLOOD: Lipase: 24 U/L (ref 11–51)

## 2022-04-09 LAB — TYPE AND SCREEN
ABO/RH(D): O POS
Antibody Screen: NEGATIVE

## 2022-04-09 LAB — PROTIME-INR
INR: 1.1 (ref 0.8–1.2)
Prothrombin Time: 13.8 seconds (ref 11.4–15.2)

## 2022-04-09 MED ORDER — ONDANSETRON HCL 4 MG/2ML IJ SOLN
4.0000 mg | Freq: Once | INTRAMUSCULAR | Status: AC
  Administered 2022-04-09: 4 mg via INTRAVENOUS
  Filled 2022-04-09: qty 2

## 2022-04-09 MED ORDER — SODIUM CHLORIDE 0.9 % IV BOLUS
1000.0000 mL | Freq: Once | INTRAVENOUS | Status: AC
  Administered 2022-04-09: 1000 mL via INTRAVENOUS

## 2022-04-09 MED ORDER — AMOXICILLIN-POT CLAVULANATE 875-125 MG PO TABS
1.0000 | ORAL_TABLET | Freq: Once | ORAL | Status: AC
  Administered 2022-04-09: 1 via ORAL
  Filled 2022-04-09: qty 1

## 2022-04-09 MED ORDER — ONDANSETRON 4 MG PO TBDP: ORAL_TABLET | ORAL | 0 refills | Status: DC

## 2022-04-09 MED ORDER — AMOXICILLIN-POT CLAVULANATE 875-125 MG PO TABS: 1.0000 | ORAL_TABLET | Freq: Two times a day (BID) | ORAL | 0 refills | Status: DC

## 2022-04-09 MED ORDER — ONDANSETRON 4 MG PO TBDP
8.0000 mg | ORAL_TABLET | Freq: Once | ORAL | Status: DC
  Filled 2022-04-09: qty 2

## 2022-04-09 MED ORDER — MORPHINE SULFATE 15 MG PO TABS: 7.5000 mg | ORAL_TABLET | ORAL | 0 refills | Status: DC | PRN

## 2022-04-09 MED ORDER — IOHEXOL 350 MG/ML SOLN
75.0000 mL | Freq: Once | INTRAVENOUS | Status: AC | PRN
  Administered 2022-04-09: 75 mL via INTRAVENOUS

## 2022-04-09 MED ORDER — MORPHINE SULFATE (PF) 2 MG/ML IV SOLN
2.0000 mg | Freq: Once | INTRAVENOUS | Status: AC
  Administered 2022-04-09: 2 mg via INTRAVENOUS
  Filled 2022-04-09: qty 1

## 2022-04-09 NOTE — ED Triage Notes (Signed)
Pt came in via POV d/t Rectal Bleeding since Wednesday night, Large amounts of bright red blood. Does not leak any blood while NOT on the toilet. Has been vomiting as well, stomach aches, no bloody emesis. Weakness, light headed & dizzy. A/Ox4, rates abd pain 8/10.

## 2022-04-09 NOTE — Discharge Instructions (Signed)
Return for worsening or uncontrolled pain fever inability eat or drink

## 2022-04-09 NOTE — ED Notes (Signed)
Pt requested to hold zofran at this time due to resolution of nausea.

## 2022-04-09 NOTE — Telephone Encounter (Signed)
Pt states she has been seeing blood in her stool the past 3 days. Reports it is only when she has a BM and it is medium red in color and colors the toilet bowl. Pt was scheduled for an ECL in October but cancelled it due to some neck issues. She has had xrays on her neck and they were fine. Discussed with her Dr. Candis Schatz would most likely want her to reschedule the procedures but would send the note to him for possible further recommendations. Please advise.

## 2022-04-09 NOTE — ED Provider Notes (Signed)
[facility] EMERGENCY DEPARTMENT Provider Note   CSN: [id number] Arrival date & time: 04/09/22  1709     History  Chief Complaint  Patient presents with   Rectal Bleeding    Brittany Acosta is a 72 y.o. female.  72 yo F with a chief complaint of diffuse abdominal pain nausea vomiting diarrhea and blood in her stool.  Going on for couple days now.  Diffuse abdominal pain.  No known sick contacts.  Has a history of hemorrhoids but remote.  No pain or burning at the rectum.        Home Medications Prior to Admission medications   Medication Sig Start Date End Date Taking? Authorizing Provider  allopurinol (ZYLOPRIM) 300 MG tablet Take 1 tablet (300 mg total) by mouth daily. 12/30/21  Yes Tawnya Crook, MD  amoxicillin-clavulanate (AUGMENTIN) 875-125 MG tablet Take 1 tablet by mouth every 12 (twelve) hours. 04/09/22  Yes Deno Etienne, DO  anastrozole (ARIMIDEX) 1 MG tablet TAKE 1 TABLET EVERY DAY Patient taking differently: Take 1 mg by mouth daily. 11/16/21  Yes Tawnya Crook, MD  aspirin EC 81 MG tablet Take 1 tablet (81 mg total) by mouth daily. Swallow whole. 01/25/20  Yes Buford Dresser, MD  Biotin 10000 MCG TABS Take 1 tablet by mouth daily in the afternoon.   Yes [provider]  Black Elderberry,Berry-Flower, 575 MG CAPS Take 575 mg by mouth daily.   Yes [provider]  Cholecalciferol 25 MCG (1000 UT) CHEW Chew 1,000 Units by mouth daily.   Yes [provider]  Chromium-Cinnamon (CINNAMON PLUS CHROMIUM PO) Take 2 tablets by mouth daily.   Yes [provider]  cyclobenzaprine (FLEXERIL) 10 MG tablet TAKE 1 TABLET (10 MG TOTAL) BY MOUTH AT BEDTIME AS NEEDED FOR MUSCLE SPASMS. 02/14/22  Yes Tawnya Crook, MD  doxepin (SINEQUAN) 10 MG capsule Take 1 capsule (10 mg total) by mouth daily. Patient taking differently: Take 10 mg by mouth at bedtime. 12/30/21  Yes Tawnya Crook, MD  folic acid (FOLVITE) 1 MG tablet  Take 2 mg by mouth daily.   Yes [provider]  furosemide (LASIX) 40 MG tablet TAKE 1 TABLET (40 MG TOTAL) BY MOUTH DAILY. 01/26/22  Yes Tawnya Crook, MD  lamoTRIgine (LAMICTAL) 200 MG tablet TAKE 1 TABLET (200 MG TOTAL) BY MOUTH DAILY AT 12 NOON. 01/26/22  Yes Tawnya Crook, MD  levothyroxine (SYNTHROID) 50 MCG tablet TAKE 1 TABLET EVERY DAY (OFFICE VISIT NEEDED FOR FURTHER REFILLS) Patient taking differently: Take 50 mcg by mouth daily before breakfast. (OFFICE VISIT NEEDED FOR FURTHER REFILLS) 12/30/21  Yes Tawnya Crook, MD  lisinopril (ZESTRIL) 5 MG tablet TAKE 1 TABLET EVERY DAY 01/26/22  Yes Tawnya Crook, MD  loratadine (CLARITIN) 10 MG tablet Take 10 mg by mouth daily.   Yes [provider]  metFORMIN (GLUCOPHAGE) 500 MG tablet TAKE 2 TABLETS EVERY MORNING  AND TAKE 1 TABLET EVERY EVENING Patient taking differently: Take 500-1,000 mg by mouth See admin instructions. Take 2 tablets by mouth in the morning and take 1 tablet every evening 01/04/22  Yes Tawnya Crook, MD  methotrexate 2.5 MG tablet Take 15 mg by mouth every Saturday. 12/26/19  Yes [provider]  morphine (MSIR) 15 MG tablet Take 0.5 tablets (7.5 mg total) by mouth every 4 (four) hours as needed for severe pain. 04/09/22  Yes Deno Etienne, DO  omeprazole (PRILOSEC) 20 MG capsule TAKE 1 CAPSULE (  20 MG TOTAL) BY MOUTH DAILY. TAKE 30 MINUTES BEFORE A MEAL 01/26/22  Yes Tawnya Crook, MD  ondansetron (ZOFRAN-ODT) 4 MG disintegrating tablet '4mg'$  ODT q4 hours prn nausea/vomit 04/09/22  Yes Deno Etienne, DO  oxybutynin (DITROPAN-XL) 10 MG 24 hr tablet Take 1 tablet (10 mg total) by mouth at bedtime. 12/30/21  Yes Tawnya Crook, MD  potassium citrate (UROCIT-K) 10 MEQ (1080 MG) SR tablet Take 1 tablet (10 mEq total) by mouth daily in the afternoon. 12/30/21  Yes Tawnya Crook, MD  pravastatin (PRAVACHOL) 20 MG tablet Take 1 tablet (20 mg total) by mouth at bedtime. 12/30/21  Yes Tawnya Crook,  MD  predniSONE (DELTASONE) 5 MG tablet Take 5 mg by mouth daily with breakfast. 12/07/19  Yes [provider]  sertraline (ZOLOFT) 100 MG tablet Take 1 tablet (100 mg total) by mouth daily. 12/30/21  Yes Tawnya Crook, MD  sulfaSALAzine (AZULFIDINE) 500 MG tablet Take 2 tablets by mouth 2 (two) times daily.   Yes [provider]  tamsulosin (FLOMAX) 0.4 MG CAPS capsule TAKE 1 CAPSULE EVERY DAY Patient taking differently: Take 0.4 mg by mouth at bedtime. 03/24/22  Yes Tawnya Crook, MD  TRESIBA FLEXTOUCH 100 UNIT/ML FlexTouch Pen INJECT 26 UNITS UNDER THE SKIN DAILY Patient taking differently: Inject 26 Units into the skin daily. 01/26/22  Yes Tawnya Crook, MD  fluticasone [facility]) 50 MCG/ACT nasal spray USE 2 SPRAYS IN EACH NOSTRIL EVERY DAY Patient taking differently: Place 2 sprays into both nostrils daily as needed (for sinusitis). 01/26/22   Tawnya Crook, MD  glucose blood (TRUE METRIX BLOOD GLUCOSE TEST) test strip TEST BLOOD SUGAR EVERY DAY AND AS NEEDED 10/28/21   Tawnya Crook, MD  Insulin Pen Needle (DROPLET PEN NEEDLES) 31G X 5 MM MISC USE TO INJECT INSULIN DAILY 03/19/20   Inda Coke, PA  Potassium Chloride ER 20 MEQ TBCR Take 1 tablet by mouth daily. Patient not taking: Reported on 04/09/2022 12/31/21   Tawnya Crook, MD      Allergies    Patient has no known allergies.    Review of Systems   Review of Systems  Physical Exam Updated Vital Signs BP 108/73   Pulse 91   Temp 98.4 F (36.9 C)   Resp (!) 27   SpO2 98%  Physical Exam Vitals and nursing note reviewed.  Constitutional:      General: She is not in acute distress.    Appearance: She is well-developed. She is not diaphoretic.  HENT:     Head: Normocephalic and atraumatic.  Eyes:     Pupils: Pupils are equal, round, and reactive to light.  Cardiovascular:     Rate and Rhythm: Normal rate and regular rhythm.     Heart sounds: No murmur heard.    No friction rub. No  gallop.  Pulmonary:     Effort: Pulmonary effort is normal.     Breath sounds: No wheezing or rales.  Abdominal:     General: There is no distension.     Palpations: Abdomen is soft.     Tenderness: There is abdominal tenderness.     Comments: Tenderness worse to the left lower quadrant.  Musculoskeletal:        General: No tenderness.     Cervical back: Normal range of motion and neck supple.  Skin:    General: Skin is warm and dry.  Neurological:     Mental Status: She is alert  and oriented to person, place, and time.  Psychiatric:        Behavior: Behavior normal.     ED Results / Procedures / Treatments   Labs (all labs ordered are listed, but only abnormal results are displayed) Labs Reviewed  COMPREHENSIVE METABOLIC PANEL - Abnormal; Notable for the following components:      Result Value   Sodium 133 (*)    Potassium 3.1 (*)    Chloride 92 (*)    Glucose, Bld 128 (*)    BUN 7 (*)    Calcium 8.6 (*)    Total Protein 6.4 (*)    Albumin 3.2 (*)    All other components within normal limits  CBC WITH DIFFERENTIAL/PLATELET - Abnormal; Notable for the following components:   WBC 23.2 (*)    RBC 3.85 (*)    Hemoglobin 10.7 (*)    HCT 33.4 (*)    RDW 16.7 (*)    Platelets 483 (*)    Neutro Abs 19.7 (*)    Monocytes Absolute 1.1 (*)    Abs Immature Granulocytes 0.16 (*)    All other components within normal limits  PROTIME-INR  LIPASE, BLOOD  URINALYSIS, ROUTINE W REFLEX MICROSCOPIC  I-STAT CHEM 8, ED  TYPE AND SCREEN    EKG None  Radiology CT ABDOMEN PELVIS W CONTRAST  Result Date: 04/09/2022 CLINICAL DATA:  Abdominal pain, acute, nonlocalized. EXAM: CT ABDOMEN AND PELVIS WITH CONTRAST TECHNIQUE: Multidetector CT imaging of the abdomen and pelvis was performed using the standard protocol following bolus administration of intravenous contrast. RADIATION DOSE REDUCTION: This exam was performed according to the departmental dose-optimization program which  includes automated exposure control, adjustment of the mA and/or kV according to patient size and/or use of iterative reconstruction technique. CONTRAST:  72m OMNIPAQUE IOHEXOL 350 MG/ML SOLN COMPARISON:  12/12/2012 FINDINGS: Lower chest: No acute abnormality. Hepatobiliary: No focal liver abnormality is seen. Status post cholecystectomy. No biliary dilatation. Pancreas: Atrophic but otherwise unremarkable. Spleen: Normal in size without focal abnormality. Adrenals/Urinary Tract: The kidneys are normal in size and position. Tiny cortical hypodensities within the kidneys bilaterally are indeterminate but likely represent multiple simple cortical cysts. 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 No follow-up imaging is recommended for these lesions. The kidneys are otherwise unremarkable. The bladder is unremarkable. Stomach/Bowel: There is long segment circumferential mild bowel wall thickening and pericolonic inflammatory stranding involving the descending and proximal sigmoid colon in keeping with a infectious, inflammatory, or ischemic colitis. Pericolonic dense calcification adjacent to the distal descending colon likely reflects the sequela of remote inflammation. No evidence of obstruction or perforation. No free intraperitoneal gas. No loculated or free intraperitoneal fluid. The stomach, small bowel, and large bowel are otherwise unremarkable save for a few scattered cecal diverticula. The appendix is absent. Vascular/Lymphatic: Retroaortic left renal vein. The abdominal vasculature is otherwise unremarkable. No pathologic adenopathy within the abdomen and pelvis. Reproductive: Status post hysterectomy. No adnexal masses. Other: No abdominal wall hernia. Musculoskeletal: No  acute bone abnormality. Interval L4-5 lumbar fusion with instrumentation. Advanced degenerative changes are seen throughout the lumbar spine. No lytic or blastic bone lesion. IMPRESSION: 1. Long segment circumferential bowel wall thickening and pericolonic inflammatory stranding involving the descending and proximal sigmoid colon in keeping with a infectious, inflammatory,  or ischemic colitis. No evidence of obstruction or perforation. Electronically Signed   By: Fidela Salisbury M.D.   On: 04/09/2022 23:02    Procedures Procedures    Medications Ordered in ED Medications  ondansetron (ZOFRAN-ODT) disintegrating tablet 8 mg (has no administration in time range)  amoxicillin-clavulanate (AUGMENTIN) 875-125 MG per tablet 1 tablet (has no administration in time range)  sodium chloride 0.9 % bolus 1,000 mL (1,000 mLs Intravenous New Bag/Given 04/09/22 2206)  morphine (PF) 2 MG/ML injection 2 mg (2 mg Intravenous Given 04/09/22 2205)  ondansetron (ZOFRAN) injection 4 mg (4 mg Intravenous Given 04/09/22 2203)  iohexol (OMNIPAQUE) 350 MG/ML injection 75 mL (75 mLs Intravenous Contrast Given 04/09/22 2242)    ED Course/ Medical Decision Making/ A&P                           Medical Decision Making Amount and/or Complexity of Data Reviewed Radiology: ordered.  Risk Prescription drug management.   72 yo F with a chief complaints of abdominal discomfort nausea vomiting and diarrhea.  Has a significant leukocytosis of 22,000.  Hemoglobin appears to be stable.  Mild hyponatremia hypochloremia most consistent with dehydration.  Will give a bolus of IV fluids.  Potassium 3.1.  Will obtain a CT scan of the abdomen pelvis.  Reassess.  CT scan consistent with colitis.  Will treat with antibiotics.  PCP follow-up.  11:17 PM:  I have discussed the diagnosis/risks/treatment options with the patient and family.  Evaluation and diagnostic testing in the emergency department does not suggest an emergent condition  requiring admission or immediate intervention beyond what has been performed at this time.  They will follow up with PCP. We also discussed returning to the ED immediately if new or worsening sx occur. We discussed the sx which are most concerning (e.g., sudden worsening pain, fever, inability to tolerate by mouth) that necessitate immediate return. Medications administered to the patient during their visit and any new prescriptions provided to the patient are listed below.  Medications given during this visit Medications  ondansetron (ZOFRAN-ODT) disintegrating tablet 8 mg (has no administration in time range)  amoxicillin-clavulanate (AUGMENTIN) 875-125 MG per tablet 1 tablet (has no administration in time range)  sodium chloride 0.9 % bolus 1,000 mL (1,000 mLs Intravenous New Bag/Given 04/09/22 2206)  morphine (PF) 2 MG/ML injection 2 mg (2 mg Intravenous Given 04/09/22 2205)  ondansetron (ZOFRAN) injection 4 mg (4 mg Intravenous Given 04/09/22 2203)  iohexol (OMNIPAQUE) 350 MG/ML injection 75 mL (75 mLs Intravenous Contrast Given 04/09/22 2242)     The patient appears reasonably screen and/or stabilized for discharge and I doubt any other medical condition or other [facility] requiring further screening, evaluation, or treatment in the ED at this time prior to discharge.          Final Clinical Impression(s) / ED Diagnoses Final diagnoses:  Colitis    Rx / DC Orders ED Discharge Orders          Ordered    morphine (MSIR) 15 MG tablet  Every 4 hours PRN        04/09/22 2316    ondansetron (ZOFRAN-ODT) 4 MG disintegrating tablet        04/09/22 2316    amoxicillin-clavulanate (AUGMENTIN) 875-125 MG tablet  Every 12 hours        04/09/22 2316              Deno Etienne, DO 04/09/22 2317

## 2022-04-09 NOTE — ED Provider Triage Note (Signed)
Emergency Medicine Provider Triage Evaluation Note  Brittany Acosta , a 72 y.o. female  was evaluated in triage.  Pt complains of rectal bleeding.  Patient reports large amounts of bright red blood in stool since Wednesday.  She states she has been having bowel movements every 1-2 hours.  Has had associated nausea and emesis nonbloody in appearance without coffee-ground emesis.  Denies history of similar symptoms or blood thinner use.  Reports abdominal pain left-sided in nature.  Currently complaining of weakness, lightheadedness, dizziness as well as shortness of breath..  Review of Systems  Positive: See abov Negative:   Physical Exam  BP (!) 126/59   Pulse 96   Temp 98.4 F (36.9 C)   Resp 17   SpO2 98%  Gen:   Awake, no distress   Resp:  Normal effort  MSK:   Moves extremities without difficulty  Other:    Medical Decision Making  Medically screening exam initiated at 6:19 PM.  Appropriate orders placed.  Brittany Acosta was informed that the remainder of the evaluation will be completed by another provider, this initial triage assessment does not replace that evaluation, and the importance of remaining in the ED until their evaluation is complete.     Wilnette Kales, [place] 04/09/22 [phone]

## 2022-04-09 NOTE — Telephone Encounter (Signed)
Inbound call from patient stating she passing blood. Pease advise.  Thank you

## 2022-04-12 ENCOUNTER — Other Ambulatory Visit: Payer: Self-pay

## 2022-04-12 DIAGNOSIS — K921 Melena: Secondary | ICD-10-CM

## 2022-04-12 NOTE — Telephone Encounter (Signed)
Left message for pt to call back. Previsit and colon tentatively scheduled.  Spoke with pt and she is aware of appts. She will call back if she needs to change anything.

## 2022-04-14 ENCOUNTER — Ambulatory Visit (AMBULATORY_SURGERY_CENTER): Payer: PPO

## 2022-04-14 ENCOUNTER — Encounter: Payer: Self-pay | Admitting: Gastroenterology

## 2022-04-14 VITALS — Ht 61.0 in | Wt 220.0 lb

## 2022-04-14 DIAGNOSIS — K921 Melena: Secondary | ICD-10-CM

## 2022-04-14 MED ORDER — PEG 3350-KCL-NA BICARB-NACL 420 G PO SOLR: 4000.0000 mL | Freq: Once | ORAL | 0 refills | Status: AC

## 2022-04-14 NOTE — Progress Notes (Signed)
No egg or soy allergy known to patient  No issues known to pt with past sedation with any surgeries or procedures Patient denies ever being told they had issues or difficulty with intubation  No FH of Malignant Hyperthermia Pt is not on diet pills Pt is not on  home 02  Pt is not on blood thinners  Pt denies issues with constipation  No A fib or A flutter Have any cardiac testing pending--no Pt instructed to use [email or web address] or GoodRx for a price reduction on prep  Patient's chart reviewed by Brittany Acosta CNRA prior to previsit and patient appropriate for the [facility].  Previsit completed and red dot placed by patient's name on their procedure day (on provider's schedule).    

## 2022-04-16 ENCOUNTER — Encounter: Payer: Self-pay | Admitting: Gastroenterology

## 2022-04-16 ENCOUNTER — Ambulatory Visit (AMBULATORY_SURGERY_CENTER): Payer: PPO | Admitting: Gastroenterology

## 2022-04-16 VITALS — BP 149/54 | HR 66 | Temp 97.1°F | Resp 19

## 2022-04-16 DIAGNOSIS — K5289 Other specified noninfective gastroenteritis and colitis: Secondary | ICD-10-CM

## 2022-04-16 DIAGNOSIS — K5730 Diverticulosis of large intestine without perforation or abscess without bleeding: Secondary | ICD-10-CM

## 2022-04-16 DIAGNOSIS — F32A Depression, unspecified: Secondary | ICD-10-CM | POA: Diagnosis not present

## 2022-04-16 DIAGNOSIS — G4730 Sleep apnea, unspecified: Secondary | ICD-10-CM | POA: Diagnosis not present

## 2022-04-16 DIAGNOSIS — K921 Melena: Secondary | ICD-10-CM | POA: Diagnosis not present

## 2022-04-16 DIAGNOSIS — F319 Bipolar disorder, unspecified: Secondary | ICD-10-CM | POA: Diagnosis not present

## 2022-04-16 DIAGNOSIS — I10 Essential (primary) hypertension: Secondary | ICD-10-CM | POA: Diagnosis not present

## 2022-04-16 DIAGNOSIS — K5190 Ulcerative colitis, unspecified, without complications: Secondary | ICD-10-CM | POA: Diagnosis not present

## 2022-04-16 MED ORDER — SODIUM CHLORIDE 0.9 % IV SOLN: 500.0000 mL | Freq: Once | INTRAVENOUS | Status: DC

## 2022-04-16 NOTE — Patient Instructions (Addendum)
Recommendation: Patient has a contact number available for                            emergencies. The signs and symptoms of potential                            delayed complications were discussed with the                            patient. Return to normal activities tomorrow.                            Written discharge instructions were provided to the                            patient.                           - Resume previous diet.                           - Continue present medications.                           - Await pathology results.                           - Further recommendations will be based on biopsy                            results.  Handout on Diverticulosis given.  YOU HAD AN ENDOSCOPIC PROCEDURE TODAY AT [facility]:   Refer to the procedure report that was given to you for any specific questions about what was found during the examination.  If the procedure report does not answer your questions, please call your gastroenterologist to clarify.  If you requested that your care partner not be given the details of your procedure findings, then the procedure report has been included in a sealed envelope for you to review at your convenience later.  YOU SHOULD EXPECT: Some feelings of bloating in the abdomen. Passage of more gas than usual.  Walking can help get rid of the air that was put into your GI tract during the procedure and reduce the bloating. If you had a lower endoscopy (such as a colonoscopy or flexible sigmoidoscopy) you may notice spotting of blood in your stool or on the toilet paper. If you underwent a bowel prep for your procedure, you may not have a normal bowel movement for a few days.  Please Note:  You might notice some irritation and congestion in your nose or some drainage.  This is from the oxygen used during your procedure.  There is no need for concern and it should clear up in a day or so.  SYMPTOMS TO REPORT  IMMEDIATELY:  Following lower endoscopy (colonoscopy or flexible sigmoidoscopy):  Excessive amounts of blood in the stool  Significant tenderness or worsening of abdominal pains  Swelling of the abdomen that is new, acute  Fever of 100F or higher  For urgent or emergent issues, a gastroenterologist can  be reached at any hour by calling [phone]. Do not use MyChart messaging for urgent concerns.   DIET:  We do recommend a small meal at first, but then you may proceed to your regular diet.  Drink plenty of fluids but you should avoid alcoholic beverages for 24 hours.  ACTIVITY:  You should plan to take it easy for the rest of today and you should NOT DRIVE or use heavy machinery until tomorrow (because of the sedation medicines used during the test).    FOLLOW UP: Our staff will call the number listed on your records the next business day following your procedure.  We will call around 7:15- 8:00 am to check on you and address any questions or concerns that you may have regarding the information given to you following your procedure. If we do not reach you, we will leave a message.     If any biopsies were taken you will be contacted by phone or by letter within the next 1-3 weeks.  Please call us at [phone] if you have not heard about the biopsies in 3 weeks.    SIGNATURES/CONFIDENTIALITY: You and/or your care partner have signed paperwork which will be entered into your electronic medical record.  These signatures attest to the fact that that the information above on your After Visit Summary has been reviewed and is understood.  Full responsibility of the confidentiality of this discharge information lies with you and/or your care-partner.

## 2022-04-16 NOTE — Progress Notes (Signed)
Sedate, gd SR, tolerated procedure well, VSS, report to RN 

## 2022-04-16 NOTE — Progress Notes (Signed)
Called to room to assist during endoscopic procedure.  Patient ID and intended procedure confirmed with present staff. Received instructions for my participation in the procedure from the performing physician.  

## 2022-04-16 NOTE — Progress Notes (Signed)
[facility] History and Physical   Primary Care Physician:  Tawnya Crook, MD   Reason for Procedure:   Iron deficiency anemia, FOBT positive  Plan:    Colonoscopy     HPI: Brittany Acosta is a 72 y.o. female undergoing colonoscopy for iron deficiency anemia, positive FOBT and new onset hematochezia.  She had a negative cologuard in 2021 and a normal colonscopy in 2006.  She started seeing blood in the stool in January.  She was initially seen in September for IDA and positive FOBT and had been scheduled for an EGD/colonoscopy in October but cancelled due to neck issues.  She was rescheduled for an expedited colonoscopy due to the new onset hematochezia.  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[place])    Nontoxic multinodular goiter    Osteoporosis    Personal history of radiation therapy    Pneumonia    PONV (postoperative nausea and vomiting)    PTSD (post-traumatic stress disorder)    Pure hypercholesterolemia    RBBB    Scleroderma ([place])    Sleep apnea 10/2012   mild osa-did not need cpap -dr clance   Type II diabetes mellitus ([place])    Unspecified hypothyroidism     Past Surgical History:  Procedure Laterality Date   ABDOMINAL HYSTERECTOMY  1976   BREAST EXCISIONAL BIOPSY Left    BREAST LUMPECTOMY Left 04/25/2018   BREAST LUMPECTOMY WITH RADIOACTIVE SEED AND SENTINEL LYMPH NODE BIOPSY  Left 04/25/2018   Procedure: LEFT BREAST LUMPECTOMY WITH RADIOACTIVE SEED AND SENTINEL LYMPH NODE BIOPSY;  Surgeon: Excell Seltzer, MD;  Location: [facility];  Service: General;  Laterality: Left;   [place]  2001   sees Dr Peter Martinique   CATARACT EXTRACTION  2014   CATARACT EXTRACTION, BILATERAL  02/2011   epps   CHOLECYSTECTOMY  1985   EXCISIONAL HEMORRHOIDECTOMY     "dr cut out in his office" (02/08/2012)   [place]  2000   right   JOINT REPLACEMENT     rt knee   KNEE ARTHROSCOPY  08/2009   right   KNEE ARTHROSCOPY WITH MEDIAL MENISECTOMY     left   Left Cystoscopy   [place]   Lithotripsy (L) Kidney  1997   LUMBAR LAMINECTOMY/DECOMPRESSION MICRODISCECTOMY Left 07/01/2016   Procedure: Laminectomy for synovial cyst - left - Lumbar four-five;  Surgeon: Earnie Larsson, MD;  Location: [facility];  Service: Neurosurgery;  Laterality: Left;   PARTIAL MASTECTOMY WITH NEEDLE LOCALIZATION Left 09/01/2012   Procedure: PARTIAL MASTECTOMY WITH NEEDLE LOCALIZATION;  Surgeon: Adin Hector, MD;  Location: [facility];  Service: General;  Laterality: Left;   [place] stone removed (  L) Kidney  1992   REVERSE SHOULDER ARTHROPLASTY Right 02/12/2021   Procedure: REVERSE SHOULDER ARTHROPLASTY;  Surgeon: Tania Ade, MD;  Location: [facility];  Service: Orthopedics;  Laterality: Right;   Right nasal surgery  08/1988   Right sinus removed  08/1989   tooth partial   ROTATOR CUFF REPAIR  2013   right shoulder x 2   SHOULDER ARTHROSCOPY WITH ROTATOR CUFF REPAIR AND SUBACROMIAL DECOMPRESSION Left 08/20/2013   Procedure: SHOULDER ARTHROSCOPY  AND SUBACROMIAL DECOMPRESSION;  Surgeon: Nita Sells, MD;  Location: [facility];  Service: Orthopedics;  Laterality: Left;  Left shoulder arthroscopy, debridement, subacromial decompression, distal clavical resection   THYROIDECTOMY  04/22/2011   Procedure: THYROIDECTOMY;   Surgeon: Onnie Graham, MD;  Location: [facility];  Service: ENT;  Laterality: N/A;  TOTAL THYROIDECOTMY   TOTAL KNEE ARTHROPLASTY  06/18/2011   Procedure: TOTAL KNEE ARTHROPLASTY;  Surgeon: Kerin Salen, MD;  Location: [facility];  Service: Orthopedics;  Laterality: Left;  DEPUY   TOTAL KNEE ARTHROPLASTY  02/07/2012   Procedure: TOTAL KNEE ARTHROPLASTY;  Surgeon: Kerin Salen, MD;  Location: [facility];  Service: Orthopedics;  Laterality: Right;   TUBAL LIGATION  1972    Prior to Admission medications   Medication Sig Start Date End Date Taking? Authorizing Provider  allopurinol (ZYLOPRIM) 300 MG tablet Take 1 tablet (300 mg total) by mouth daily. 12/30/21  Yes Tawnya Crook, MD  anastrozole (ARIMIDEX) 1 MG tablet TAKE 1 TABLET EVERY DAY Patient taking differently: Take 1 mg by mouth daily. 11/16/21  Yes Tawnya Crook, MD  aspirin EC 81 MG tablet Take 1 tablet (81 mg total) by mouth daily. Swallow whole. 01/25/20  Yes Buford Dresser, MD  Biotin 10000 MCG TABS Take 1 tablet by mouth daily in the afternoon.   Yes [provider]  Black Elderberry,Berry-Flower, 575 MG CAPS Take 575 mg by mouth daily.   Yes [provider]  calcium-vitamin D (OSCAL WITH D) 500-5 MG-MCG tablet Take 1 tablet by mouth.   Yes [provider]  Chromium-Cinnamon (CINNAMON PLUS CHROMIUM PO) Take 2 tablets by mouth daily.   Yes [provider]  furosemide (LASIX) 40 MG tablet TAKE 1 TABLET (40 MG TOTAL) BY MOUTH DAILY. 01/26/22  Yes Tawnya Crook, MD  glucose blood (TRUE METRIX BLOOD GLUCOSE TEST) test strip TEST BLOOD SUGAR EVERY DAY AND AS NEEDED 10/28/21  Yes Tawnya Crook, MD  Insulin Pen Needle (DROPLET PEN NEEDLES) 31G X 5 MM MISC USE TO INJECT INSULIN DAILY 03/19/20  Yes Inda Coke, PA  lamoTRIgine (LAMICTAL) 200 MG tablet TAKE 1 TABLET (200 MG TOTAL) BY MOUTH DAILY AT 12 NOON. 01/26/22  Yes Tawnya Crook, MD  levothyroxine (SYNTHROID) 50 MCG tablet TAKE 1 TABLET  EVERY DAY (OFFICE VISIT NEEDED FOR FURTHER REFILLS) Patient taking differently: Take 50 mcg by mouth daily before breakfast. (OFFICE VISIT NEEDED FOR FURTHER REFILLS) 12/30/21  Yes Tawnya Crook, MD  lisinopril (ZESTRIL) 5 MG tablet TAKE 1 TABLET EVERY DAY 01/26/22  Yes Tawnya Crook, MD  loratadine (CLARITIN) 10 MG tablet Take 10 mg by mouth daily.   Yes [provider]  metFORMIN (GLUCOPHAGE) 500 MG tablet TAKE 2 TABLETS EVERY MORNING  AND TAKE 1 TABLET EVERY EVENING Patient taking differently: Take 500-1,000 mg by mouth See admin instructions. Take 2 tablets by mouth in the morning and take 1 tablet every evening 01/04/22  Yes Tawnya Crook, MD  omeprazole [facility])  20 MG capsule TAKE 1 CAPSULE (20 MG TOTAL) BY MOUTH DAILY. TAKE 30 MINUTES BEFORE A MEAL 01/26/22  Yes Tawnya Crook, MD  ondansetron (ZOFRAN-ODT) 4 MG disintegrating tablet '4mg'$  ODT q4 hours prn nausea/vomit 04/09/22  Yes Deno Etienne, DO  oxybutynin (DITROPAN-XL) 10 MG 24 hr tablet Take 1 tablet (10 mg total) by mouth at bedtime. 12/30/21  Yes Tawnya Crook, MD  potassium citrate (UROCIT-K) 10 MEQ (1080 MG) SR tablet Take 1 tablet (10 mEq total) by mouth daily in the afternoon. 12/30/21  Yes Tawnya Crook, MD  pravastatin (PRAVACHOL) 20 MG tablet Take 1 tablet (20 mg total) by mouth at bedtime. 12/30/21  Yes Tawnya Crook, MD  predniSONE (DELTASONE) 5 MG tablet Take 5 mg by mouth daily with breakfast. 12/07/19  Yes [provider]  sertraline (ZOLOFT) 100 MG tablet Take 1 tablet (100 mg total) by mouth daily. 12/30/21  Yes Tawnya Crook, MD  sulfaSALAzine (AZULFIDINE) 500 MG tablet Take 2 tablets by mouth 2 (two) times daily.   Yes [provider]  tamsulosin (FLOMAX) 0.4 MG CAPS capsule TAKE 1 CAPSULE EVERY DAY Patient taking differently: Take 0.4 mg by mouth at bedtime. 03/24/22  Yes Tawnya Crook, MD  TRESIBA FLEXTOUCH 100 UNIT/ML FlexTouch Pen INJECT 26 UNITS UNDER THE SKIN DAILY Patient  taking differently: Inject 26 Units into the skin daily. 01/26/22  Yes Tawnya Crook, MD  cyclobenzaprine (FLEXERIL) 10 MG tablet TAKE 1 TABLET (10 MG TOTAL) BY MOUTH AT BEDTIME AS NEEDED FOR MUSCLE SPASMS. 02/14/22   Tawnya Crook, MD  doxepin (SINEQUAN) 10 MG capsule Take 1 capsule (10 mg total) by mouth daily. Patient taking differently: Take 10 mg by mouth at bedtime. 12/30/21   Tawnya Crook, MD  fluticasone [facility]) 50 MCG/ACT nasal spray USE 2 SPRAYS IN EACH NOSTRIL EVERY DAY Patient taking differently: Place 2 sprays into both nostrils daily as needed (for sinusitis). 01/26/22   Tawnya Crook, MD  methotrexate 2.5 MG tablet Take 15 mg by mouth every Saturday. Patient not taking: Reported on 04/16/2022 12/26/19   [provider]  morphine (MSIR) 15 MG tablet Take 0.5 tablets (7.5 mg total) by mouth every 4 (four) hours as needed for severe pain. Patient not taking: Reported on 04/14/2022 04/09/22   Deno Etienne, DO  Potassium Chloride ER 20 MEQ TBCR Take 1 tablet by mouth daily. Patient not taking: Reported on 04/16/2022 12/31/21   Tawnya Crook, MD    Current Outpatient Medications  Medication Sig Dispense Refill   allopurinol (ZYLOPRIM) 300 MG tablet Take 1 tablet (300 mg total) by mouth daily. 90 tablet 3   anastrozole (ARIMIDEX) 1 MG tablet TAKE 1 TABLET EVERY DAY (Patient taking differently: Take 1 mg by mouth daily.) 90 tablet 0   aspirin EC 81 MG tablet Take 1 tablet (81 mg total) by mouth daily. Swallow whole. 90 tablet 3   Biotin 10000 MCG TABS Take 1 tablet by mouth daily in the afternoon.     Black Elderberry,Berry-Flower, 575 MG CAPS Take 575 mg by mouth daily.     calcium-vitamin D (OSCAL WITH D) 500-5 MG-MCG tablet Take 1 tablet by mouth.     Chromium-Cinnamon (CINNAMON PLUS CHROMIUM PO) Take 2 tablets by mouth daily.     furosemide (LASIX) 40 MG tablet TAKE 1 TABLET (40 MG TOTAL) BY MOUTH DAILY. 90 tablet 10   glucose blood (TRUE METRIX BLOOD GLUCOSE TEST)  test strip TEST BLOOD SUGAR EVERY  DAY AND AS NEEDED 200 strip 4   Insulin Pen Needle (DROPLET PEN NEEDLES) 31G X 5 MM MISC USE TO INJECT INSULIN DAILY 100 each 3   lamoTRIgine (LAMICTAL) 200 MG tablet TAKE 1 TABLET (200 MG TOTAL) BY MOUTH DAILY AT 12 NOON. 90 tablet 10   levothyroxine (SYNTHROID) 50 MCG tablet TAKE 1 TABLET EVERY DAY (OFFICE VISIT NEEDED FOR FURTHER REFILLS) (Patient taking differently: Take 50 mcg by mouth daily before breakfast. (OFFICE VISIT NEEDED FOR FURTHER REFILLS)) 90 tablet 3   lisinopril (ZESTRIL) 5 MG tablet TAKE 1 TABLET EVERY DAY 90 tablet 10   loratadine (CLARITIN) 10 MG tablet Take 10 mg by mouth daily.     metFORMIN (GLUCOPHAGE) 500 MG tablet TAKE 2 TABLETS EVERY MORNING  AND TAKE 1 TABLET EVERY EVENING (Patient taking differently: Take 500-1,000 mg by mouth See admin instructions. Take 2 tablets by mouth in the morning and take 1 tablet every evening) 270 tablet 1   omeprazole (PRILOSEC) 20 MG capsule TAKE 1 CAPSULE (20 MG TOTAL) BY MOUTH DAILY. TAKE 30 MINUTES BEFORE A MEAL 90 capsule 10   ondansetron (ZOFRAN-ODT) 4 MG disintegrating tablet '4mg'$  ODT q4 hours prn nausea/vomit 20 tablet 0   oxybutynin (DITROPAN-XL) 10 MG 24 hr tablet Take 1 tablet (10 mg total) by mouth at bedtime. 90 tablet 3   potassium citrate (UROCIT-K) 10 MEQ (1080 MG) SR tablet Take 1 tablet (10 mEq total) by mouth daily in the afternoon. 90 tablet 1   pravastatin (PRAVACHOL) 20 MG tablet Take 1 tablet (20 mg total) by mouth at bedtime. 90 tablet 3   predniSONE (DELTASONE) 5 MG tablet Take 5 mg by mouth daily with breakfast.     sertraline (ZOLOFT) 100 MG tablet Take 1 tablet (100 mg total) by mouth daily. 90 tablet 0   sulfaSALAzine (AZULFIDINE) 500 MG tablet Take 2 tablets by mouth 2 (two) times daily.     tamsulosin (FLOMAX) 0.4 MG CAPS capsule TAKE 1 CAPSULE EVERY DAY (Patient taking differently: Take 0.4 mg by mouth at bedtime.) 90 capsule 1   TRESIBA FLEXTOUCH 100 UNIT/ML FlexTouch Pen  INJECT 26 UNITS UNDER THE SKIN DAILY (Patient taking differently: Inject 26 Units into the skin daily.) 30 mL 2   cyclobenzaprine (FLEXERIL) 10 MG tablet TAKE 1 TABLET (10 MG TOTAL) BY MOUTH AT BEDTIME AS NEEDED FOR MUSCLE SPASMS. 90 tablet 0   doxepin (SINEQUAN) 10 MG capsule Take 1 capsule (10 mg total) by mouth daily. (Patient taking differently: Take 10 mg by mouth at bedtime.) 90 capsule 1   fluticasone (FLONASE) 50 MCG/ACT nasal spray USE 2 SPRAYS IN EACH NOSTRIL EVERY DAY (Patient taking differently: Place 2 sprays into both nostrils daily as needed (for sinusitis).) 48 g 2   methotrexate 2.5 MG tablet Take 15 mg by mouth every Saturday. (Patient not taking: Reported on 04/16/2022)     morphine (MSIR) 15 MG tablet Take 0.5 tablets (7.5 mg total) by mouth every 4 (four) hours as needed for severe pain. (Patient not taking: Reported on 04/14/2022) 3 tablet 0   Potassium Chloride ER 20 MEQ TBCR Take 1 tablet by mouth daily. (Patient not taking: Reported on 04/16/2022) 30 tablet 1   Current Facility-Administered Medications  Medication Dose Route Frequency Provider Last Rate Last Admin   0.9 %  sodium chloride infusion  500 mL Intravenous Once Daryel November, MD        Allergies as of 04/16/2022   (No Known Allergies)    Family  History  Problem Relation Age of Onset   Hypertension Mother    Lung cancer Mother        non-smoker   Diabetes Father    Hypertension Father    Hyperlipidemia Father    Hypertension Sister    Hypertension Sister    Hypertension Sister    Hypertension Brother    Hypertension Brother    Hypertension Brother    Stomach cancer Maternal Grandmother    Diabetes Daughter    Heart attack Other    Coronary artery disease Other    Colon cancer Neg Hx        unsure   Esophageal cancer Neg Hx    Liver cancer Neg Hx    Pancreatic cancer Neg Hx    Rectal cancer Neg Hx    Colon polyps Neg Hx     Social History   Socioeconomic History   Marital status:  Married    Spouse name: Not on file   Number of children: 2   Years of education: 14   Highest education level: Not on file  Occupational History   Occupation: disability    Employer: RETIRED  Tobacco Use   Smoking status: Former    Packs/day: 1.00    Years: 6.00    Total pack years: 6.00    Types: Cigarettes    Quit date: 04/06/1983    Years since quitting: 39.0   Smokeless tobacco: Never   Tobacco comments:    Married, lives with spouse. Disable- 2 grown kids-6 g-kids  Vaping Use   Vaping Use: Never used  Substance and Sexual Activity   Alcohol use: No    Comment: none   Drug use: No   Sexual activity: Not Currently  Other Topics Concern   Not on file  Social History Narrative   Married, lives with spouse - 2 adult children   Disabled -- started around 2000 for mental health reasons, Bipolar Disorder/PTSD   Husband works   Investment banker, operational of Radio broadcast assistant Strain: Not on file  Food Insecurity: Not on file  Transportation Needs: Not on file  Physical Activity: Not on file  Stress: Not on file  Social Connections: Not on file  Intimate Partner Violence: Not on file    Review of Systems:  All other review of systems negative except as mentioned in the HPI.  Physical Exam: Vital signs Temp (!) 97.1 F (36.2 C)   General:   Alert,  Well-developed, well-nourished, pleasant and cooperative in NAD Airway:  Mallampati 1 Lungs:  Clear throughout to auscultation.   Heart:  Regular rate and rhythm; no murmurs, clicks, rubs,  or gallops. Abdomen:  Soft, nontender and nondistended. Normal bowel sounds.   Neuro/Psych:  Normal mood and affect. A and O x 3   Serrena Linderman E. Candis Schatz, MD  [facility]

## 2022-04-16 NOTE — Op Note (Signed)
[facility] Patient Name: Brittany Acosta Procedure Date: 04/16/2022 10:26 AM MRN: [id number] Endoscopist: Nicki Reaper E. Candis Schatz , MD, [id number] Age: 72 Referring MD:  Date of Birth: 1950/09/22 Gender: Female Account #: [id number] Procedure:                Colonoscopy Indications:              Hematochezia, Heme positive stool, Iron deficiency                            anemia Medicines:                Monitored Anesthesia Care Procedure:                Pre-Anesthesia Assessment:                           - Prior to the procedure, a History and Physical                            was performed, and patient medications and                            allergies were reviewed. The patient's tolerance of                            previous anesthesia was also reviewed. The risks                            and benefits of the procedure and the sedation                            options and risks were discussed with the patient.                            All questions were answered, and informed consent                            was obtained. Prior Anticoagulants: The patient has                            taken no anticoagulant or antiplatelet agents                            except for aspirin. ASA Grade Assessment: III - A                            patient with severe systemic disease. After                            reviewing the risks and benefits, the patient was                            deemed in satisfactory condition to undergo the  procedure.                           After obtaining informed consent, the colonoscope                            was passed under direct vision. Throughout the                            procedure, the patient's blood pressure, pulse, and                            oxygen saturations were monitored continuously. The                            Olympus Scope [id number] was introduced through the                             anus and advanced to the the terminal ileum, with                            identification of the appendiceal orifice and IC                            valve. The colonoscopy was performed without                            difficulty. The patient tolerated the procedure                            well. The quality of the bowel preparation was                            adequate. The terminal ileum, ileocecal valve,                            appendiceal orifice, and rectum were photographed.                            The bowel preparation used was GoLYTELY via split                            dose instruction. Scope In: 10:47:03 AM Scope Out: 11:05:46 AM Scope Withdrawal Time: 0 hours 12 minutes 46 seconds  Total Procedure Duration: 0 hours 18 minutes 43 seconds  Findings:                 The perianal and digital rectal examinations were                            normal. Pertinent negatives include normal                            sphincter tone and no palpable rectal lesions.  Ulcerated mucosa was found in the sigmoid colon, in                            the descending colon and at the splenic flexure                            (from 50 cm to 25 cm). The ulcers were superficial                            with some areas of extensive, broad ulceration. The                            ulceration was not circumferential, but did involve                            all walls of the colon. At the distal margin of the                            ulceration (around 25 cm), there were a few small                            erosions/aphtha. No bleeding was present. No                            stigmata of recent bleeding were seen. Biopsies                            were taken with a cold forceps for histology.                            Estimated blood loss was minimal. There were no                            areas of inflammation outside of this area.                            Multiple medium-mouthed and small-mouthed                            diverticula were found in the transverse colon and                            ascending colon. There was no evidence of                            diverticular bleeding.                           The exam was otherwise normal throughout the                            examined colon.  The terminal ileum appeared normal.                           The retroflexed view of the distal rectum and anal                            verge was normal and showed no anal or rectal                            abnormalities. Complications:            No immediate complications. Estimated Blood Loss:     Estimated blood loss was minimal. Impression:               - Multiple ulcers in the sigmoid colon, in the                            descending colon and at the splenic flexure.                            Biopsied. Given the localization of the ulceration,                            and it's location primarily around the splenic                            flexure, suspect ischemic colitis, although patient                            denies abdominal pain. Ischemic colitis would not                            explain iron deficiency anemia or positive FOBT                            earlier this year. Crohn's disease also possible.                           - Mild diverticulosis in the transverse colon and                            in the ascending colon. There was no evidence of                            diverticular bleeding.                           - The examined portion of the ileum was normal.                           - The distal rectum and anal verge are normal on                            retroflexion view. Recommendation:           -  Patient has a contact number available for                            emergencies. The signs and symptoms of potential                            delayed  complications were discussed with the                            patient. Return to normal activities tomorrow.                            Written discharge instructions were provided to the                            patient.                           - Resume previous diet.                           - Continue present medications.                           - Await pathology results.                           - Further recommendations will be based on biopsy                            results. Derion Kreiter E. Candis Schatz, MD 04/16/2022 11:17:41 AM This report has been signed electronically.

## 2022-04-16 NOTE — Progress Notes (Signed)
Patient  fell last Thursday. Bruised right hip. Did not go to doctor. Patient states no pain or discomfort.

## 2022-04-19 ENCOUNTER — Telehealth: Payer: Self-pay

## 2022-04-19 DIAGNOSIS — M1990 Unspecified osteoarthritis, unspecified site: Secondary | ICD-10-CM | POA: Diagnosis not present

## 2022-04-19 DIAGNOSIS — G629 Polyneuropathy, unspecified: Secondary | ICD-10-CM | POA: Diagnosis not present

## 2022-04-19 DIAGNOSIS — M109 Gout, unspecified: Secondary | ICD-10-CM | POA: Diagnosis not present

## 2022-04-19 DIAGNOSIS — Z13820 Encounter for screening for osteoporosis: Secondary | ICD-10-CM | POA: Diagnosis not present

## 2022-04-19 DIAGNOSIS — Z79899 Other long term (current) drug therapy: Secondary | ICD-10-CM | POA: Diagnosis not present

## 2022-04-19 DIAGNOSIS — L4050 Arthropathic psoriasis, unspecified: Secondary | ICD-10-CM | POA: Diagnosis not present

## 2022-04-19 DIAGNOSIS — M797 Fibromyalgia: Secondary | ICD-10-CM | POA: Diagnosis not present

## 2022-04-19 DIAGNOSIS — L603 Nail dystrophy: Secondary | ICD-10-CM | POA: Diagnosis not present

## 2022-04-19 DIAGNOSIS — R768 Other specified abnormal immunological findings in serum: Secondary | ICD-10-CM | POA: Diagnosis not present

## 2022-04-19 NOTE — Telephone Encounter (Signed)
  Follow up Call-     04/16/2022    9:38 AM  Call back number  Post procedure Call Back phone  # [phone]  Permission to leave phone message Yes     Patient questions:  Do you have a fever, pain , or abdominal swelling? No. Pain Score  0 *  Have you tolerated food without any problems? Yes.    Have you been able to return to your normal activities? Yes.    Do you have any questions about your discharge instructions: Diet   No. Medications  No. Follow up visit  No.  Do you have questions or concerns about your Care? No.  Actions: * If pain score is 4 or above: No action needed, pain <4.

## 2022-04-20 ENCOUNTER — Other Ambulatory Visit: Payer: Self-pay | Admitting: Family Medicine

## 2022-04-20 ENCOUNTER — Telehealth: Payer: Self-pay | Admitting: Family Medicine

## 2022-04-20 ENCOUNTER — Other Ambulatory Visit (HOSPITAL_COMMUNITY): Payer: Self-pay

## 2022-04-20 DIAGNOSIS — Z1231 Encounter for screening mammogram for malignant neoplasm of breast: Secondary | ICD-10-CM

## 2022-04-20 NOTE — Telephone Encounter (Signed)
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 [place], [place] [employer] [place],  [address]

## 2022-04-20 NOTE — Telephone Encounter (Signed)
Pharmacy has been updated.

## 2022-04-21 ENCOUNTER — Other Ambulatory Visit (HOSPITAL_COMMUNITY): Payer: Self-pay

## 2022-04-21 MED ORDER — PREDNISONE 5 MG PO TABS
5.0000 mg | ORAL_TABLET | Freq: Every day | ORAL | 9 refills | Status: DC
  Filled 2022-05-19: qty 90, 90d supply, fill #0
  Filled 2022-08-11: qty 90, 90d supply, fill #1
  Filled 2022-11-07: qty 90, 90d supply, fill #2
  Filled 2023-02-08: qty 90, 90d supply, fill #3

## 2022-04-21 MED FILL — Fluticasone Propionate Nasal Susp 50 MCG/ACT: NASAL | 90 days supply | Qty: 48 | Fill #0 | Status: CN

## 2022-04-21 MED FILL — Furosemide Tab 40 MG: ORAL | 90 days supply | Qty: 90 | Fill #0 | Status: CN

## 2022-04-23 ENCOUNTER — Other Ambulatory Visit: Payer: Self-pay

## 2022-04-23 DIAGNOSIS — D509 Iron deficiency anemia, unspecified: Secondary | ICD-10-CM

## 2022-04-23 NOTE — Progress Notes (Signed)
Brittany Acosta,  The biopsies of the ulceration areas in your colon did not show definitive features of Crohn's disease.  As discussed, based on the location of the inflammation, ischemic colitis seems to be more likely diagnosis, although medications such as NSAIDs (most over the counter pain medications) can also cause ulceration of the colon.  I would advise you to avoid these medications if possible. Also, the endoscopic findings are very unlikely to explain your chronic anemia and iron deficiency.  I recommend we schedule your for an upper endoscopy to exclude upper GI sources of blood loss.  Vaughan Basta,  Can you please get Ms. Roseboom scheduled for a routine EGD?

## 2022-04-26 DIAGNOSIS — M47816 Spondylosis without myelopathy or radiculopathy, lumbar region: Secondary | ICD-10-CM | POA: Diagnosis not present

## 2022-04-29 ENCOUNTER — Emergency Department (HOSPITAL_BASED_OUTPATIENT_CLINIC_OR_DEPARTMENT_OTHER): Payer: PPO

## 2022-04-29 ENCOUNTER — Encounter (HOSPITAL_BASED_OUTPATIENT_CLINIC_OR_DEPARTMENT_OTHER): Payer: Self-pay | Admitting: *Deleted

## 2022-04-29 ENCOUNTER — Emergency Department (HOSPITAL_BASED_OUTPATIENT_CLINIC_OR_DEPARTMENT_OTHER)
Admission: EM | Admit: 2022-04-29 | Discharge: 2022-04-29 | Disposition: A | Discharge status: Home / Self Care | Payer: PPO | Attending: Emergency Medicine | Admitting: Emergency Medicine

## 2022-04-29 ENCOUNTER — Other Ambulatory Visit: Payer: Self-pay

## 2022-04-29 ENCOUNTER — Emergency Department (HOSPITAL_BASED_OUTPATIENT_CLINIC_OR_DEPARTMENT_OTHER): Payer: PPO | Admitting: Radiology

## 2022-04-29 ENCOUNTER — Telehealth: Payer: Self-pay | Admitting: Family Medicine

## 2022-04-29 ENCOUNTER — Other Ambulatory Visit (HOSPITAL_BASED_OUTPATIENT_CLINIC_OR_DEPARTMENT_OTHER): Payer: Self-pay

## 2022-04-29 DIAGNOSIS — S92502A Displaced unspecified fracture of left lesser toe(s), initial encounter for closed fracture: Secondary | ICD-10-CM

## 2022-04-29 DIAGNOSIS — L03119 Cellulitis of unspecified part of limb: Secondary | ICD-10-CM

## 2022-04-29 DIAGNOSIS — R600 Localized edema: Secondary | ICD-10-CM | POA: Diagnosis not present

## 2022-04-29 DIAGNOSIS — L03116 Cellulitis of left lower limb: Secondary | ICD-10-CM | POA: Insufficient documentation

## 2022-04-29 DIAGNOSIS — Z7982 Long term (current) use of aspirin: Secondary | ICD-10-CM | POA: Diagnosis not present

## 2022-04-29 DIAGNOSIS — M79672 Pain in left foot: Secondary | ICD-10-CM | POA: Diagnosis not present

## 2022-04-29 DIAGNOSIS — I509 Heart failure, unspecified: Secondary | ICD-10-CM | POA: Insufficient documentation

## 2022-04-29 DIAGNOSIS — S92515A Nondisplaced fracture of proximal phalanx of left lesser toe(s), initial encounter for closed fracture: Secondary | ICD-10-CM | POA: Diagnosis not present

## 2022-04-29 DIAGNOSIS — L03032 Cellulitis of left toe: Secondary | ICD-10-CM | POA: Diagnosis not present

## 2022-04-29 MED ORDER — ACETAMINOPHEN 500 MG PO TABS: 500.0000 mg | ORAL_TABLET | Freq: Four times a day (QID) | ORAL | 0 refills | Status: DC | PRN

## 2022-04-29 MED ORDER — IBUPROFEN 400 MG PO TABS: 400.0000 mg | ORAL_TABLET | Freq: Four times a day (QID) | ORAL | 0 refills | Status: DC | PRN

## 2022-04-29 MED ORDER — ACETAMINOPHEN 500 MG PO TABS
500.0000 mg | ORAL_TABLET | Freq: Four times a day (QID) | ORAL | 0 refills | Status: DC | PRN
  Filled 2022-04-29: qty 30, 8d supply, fill #0

## 2022-04-29 MED ORDER — CEPHALEXIN 500 MG PO CAPS
500.0000 mg | ORAL_CAPSULE | Freq: Four times a day (QID) | ORAL | 0 refills | Status: DC
  Filled 2022-04-29: qty 28, 7d supply, fill #0

## 2022-04-29 MED ORDER — CEPHALEXIN 500 MG PO CAPS: 500.0000 mg | ORAL_CAPSULE | Freq: Four times a day (QID) | ORAL | 0 refills | Status: DC

## 2022-04-29 MED ORDER — NAPROXEN 250 MG PO TABS
250.0000 mg | ORAL_TABLET | Freq: Once | ORAL | Status: AC
  Administered 2022-04-29: 250 mg via ORAL
  Filled 2022-04-29: qty 1

## 2022-04-29 MED ORDER — ACETAMINOPHEN 325 MG PO TABS
650.0000 mg | ORAL_TABLET | Freq: Once | ORAL | Status: AC
  Administered 2022-04-29: 650 mg via ORAL
  Filled 2022-04-29: qty 2

## 2022-04-29 MED ORDER — IBUPROFEN 400 MG PO TABS
400.0000 mg | ORAL_TABLET | Freq: Four times a day (QID) | ORAL | 0 refills | Status: DC | PRN
  Filled 2022-04-29: qty 12, 3d supply, fill #0

## 2022-04-29 NOTE — ED Triage Notes (Signed)
Patient reports she woke up with pain in the left foot.  The pain is worse when she moves and when she bears weight.  She denies any known trauma.  The foot has some swelling, pedal pulses are strong and cap refill is normal.  Patient has hx of neuropathy as well.  She denies any other complaints.

## 2022-04-29 NOTE — Discharge Instructions (Addendum)
You are seen in the ER for foot pain.  Our workup here reveals that you most likely have cellulitis. Please take the antibiotics that are prescribed. Please call your PCP for a follow-up in 1 week.  Return to the emergency room if you start having worsening of your symptoms, increased leg swelling, increased pain.  The fracture of the left fourth toe probably unrelated to the pain on the top of the foot.  Keep the toes buddy taped and wear the postop shoe.  And follow-up with your orthopedic doctor.

## 2022-04-29 NOTE — Telephone Encounter (Signed)
Patient scheduled appointment for tomorrow, 04/30/22.

## 2022-04-29 NOTE — Telephone Encounter (Signed)
Please see message below

## 2022-04-29 NOTE — Telephone Encounter (Signed)
Patient can't walk with left leg - has no other symptoms. Cannot apply pressure without it hurting. Does not recall any falls during night. Patient triaged per request.

## 2022-04-29 NOTE — ED Provider Notes (Addendum)
Results for orders placed or performed during the hospital encounter of 04/09/22  Comprehensive metabolic panel  Result Value Ref Range   Sodium 133 (L) 135 - 145 mmol/L   Potassium 3.1 (L) 3.5 - 5.1 mmol/L   Chloride 92 (L) 98 - 111 mmol/L   CO2 28 22 - 32 mmol/L   Glucose, Bld 128 (H) 70 - 99 mg/dL   BUN 7 (L) 8 - 23 mg/dL   Creatinine, Ser 0.77 0.44 - 1.00 mg/dL   Calcium 8.6 (L) 8.9 - 10.3 mg/dL   Total Protein 6.4 (L) 6.5 - 8.1 g/dL   Albumin 3.2 (L) 3.5 - 5.0 g/dL   AST 19 15 - 41 U/L   ALT 13 0 - 44 U/L   Alkaline Phosphatase 74 38 - 126 U/L   Total Bilirubin 0.4 0.3 - 1.2 mg/dL   GFR, Estimated >60 >60 mL/min   Anion gap 13 5 - 15  CBC with Differential  Result Value Ref Range   WBC 23.2 (H) 4.0 - 10.5 K/uL   RBC 3.85 (L) 3.87 - 5.11 MIL/uL   Hemoglobin 10.7 (L) 12.0 - 15.0 g/dL   HCT 33.4 (L) 36.0 - 46.0 %   MCV 86.8 80.0 - 100.0 fL   MCH 27.8 26.0 - 34.0 pg   MCHC 32.0 30.0 - 36.0 g/dL   RDW 16.7 (H) 11.5 - 15.5 %   Platelets 483 (H) 150 - 400 K/uL   nRBC 0.0 0.0 - 0.2 %   Neutrophils Relative % 85 %   Neutro Abs 19.7 (H) 1.7 - 7.7 K/uL   Lymphocytes Relative 9 %   Lymphs Abs 2.2 0.7 - 4.0 K/uL   Monocytes Relative 5 %   Monocytes Absolute 1.1 (H) 0.1 - 1.0 K/uL   Eosinophils Relative 0 %   Eosinophils Absolute 0.1 0.0 - 0.5 K/uL   Basophils Relative 0 %   Basophils Absolute 0.1 0.0 - 0.1 K/uL   Immature Granulocytes 1 %   Abs Immature Granulocytes 0.16 (H) 0.00 - 0.07 K/uL  Protime-INR  Result Value Ref Range   Prothrombin Time 13.8 11.4 - 15.2 seconds   INR 1.1 0.8 - 1.2  Lipase, blood  Result Value Ref Range   Lipase 24 11 - 51 U/L  Type and screen [facility]  Result Value Ref Range   ABO/RH(D) O POS    Antibody Screen NEG    Sample Expiration      04/12/2022,2359 Performed at [facility], [address] [place], [place] [address]    DG Foot Complete Left  Result Date: 04/29/2022 CLINICAL DATA:  Left foot pain without  known injury. EXAM: LEFT FOOT - COMPLETE 3+ VIEW COMPARISON:  None Available. FINDINGS: Mildly displaced fracture is seen involving the proximal base of the fourth proximal phalanx with intra-articular extension. Severe degenerative changes seen involving the second and third tarsometatarsal joints. Narrowing and osteophyte formation is seen involving the first interphalangeal joint suggesting degenerative joint disease. Mild posterior calcaneal spurring is noted. Soft tissues are unremarkable. IMPRESSION: Mildly displaced fourth proximal phalangeal fracture with intra-articular extension. Degenerative changes are noted as described above. Electronically Signed   By: Marijo Conception M.D.   On: 04/29/2022 16:43   [place] Extrem Low Left Ltd  Result Date: 04/29/2022 CLINICAL DATA:  Foot swelling EXAM: ULTRASOUND LEFT LOWER EXTREMITY LIMITED TECHNIQUE: Ultrasound examination of the lower extremity soft tissues was performed in the area of clinical concern. COMPARISON:  None Available. FINDINGS:  Targeted ultrasound was performed of the soft tissues of the left lower extremity at site of patient's clinical concern at the dorsal foot. Mild edema within the subcutaneous soft tissues at this location. No fluid collection. The dorsalis pedis artery is noted within this location demonstrating atherosclerotic plaque. There also degenerative osteophytes noted within the underlying bony structures, evaluation of which is limited by ultrasound. IMPRESSION: 1. Mild edema within the subcutaneous soft tissues of the dorsal foot at site of patient's clinical concern. 2. Left dorsalis pedis artery is noted within this location demonstrating atherosclerotic plaque. 3. Degenerative osteophytes noted within the underlying bony structures, evaluation of which is limited by ultrasound. Electronically Signed   By: Davina Poke D.O.   On: 04/29/2022 15:13   CT ABDOMEN PELVIS W CONTRAST  Result Date: 04/09/2022 CLINICAL DATA:  Abdominal  pain, acute, nonlocalized. EXAM: CT ABDOMEN AND PELVIS WITH CONTRAST TECHNIQUE: Multidetector CT imaging of the abdomen and pelvis was performed using the standard protocol following bolus administration of intravenous contrast. RADIATION DOSE REDUCTION: This exam was performed according to the departmental dose-optimization program which includes automated exposure control, adjustment of the mA and/or kV according to patient size and/or use of iterative reconstruction technique. CONTRAST:  55m OMNIPAQUE IOHEXOL 350 MG/ML SOLN COMPARISON:  12/12/2012 FINDINGS: Lower chest: No acute abnormality. Hepatobiliary: No focal liver abnormality is seen. Status post cholecystectomy. No biliary dilatation. Pancreas: Atrophic but otherwise unremarkable. Spleen: Normal in size without focal abnormality. Adrenals/Urinary Tract: The kidneys are normal in size and position. Tiny cortical hypodensities within the kidneys bilaterally are indeterminate but likely represent multiple simple cortical cysts. 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 No follow-up imaging is recommended for these lesions. The kidneys are otherwise unremarkable. The bladder is unremarkable. Stomach/Bowel: There is long segment circumferential mild bowel wall thickening and pericolonic inflammatory stranding involving the descending and proximal sigmoid colon in keeping with a infectious, inflammatory, or ischemic colitis. Pericolonic dense calcification adjacent to the distal descending colon likely reflects the sequela of remote inflammation. No evidence of obstruction or perforation. No free intraperitoneal gas. No loculated or free intraperitoneal fluid. The stomach, small bowel, and large bowel are otherwise  unremarkable save for a few scattered cecal diverticula. The appendix is absent. Vascular/Lymphatic: Retroaortic left renal vein. The abdominal vasculature is otherwise unremarkable. No pathologic adenopathy within the abdomen and pelvis. Reproductive: Status post hysterectomy. No adnexal masses. Other: No abdominal wall hernia. Musculoskeletal: No acute bone abnormality. Interval L4-5 lumbar fusion with instrumentation. Advanced degenerative changes are seen throughout the lumbar spine. No lytic or blastic bone lesion. IMPRESSION: 1. Long segment circumferential bowel wall thickening and pericolonic inflammatory stranding involving the descending and proximal sigmoid colon in keeping with a infectious, inflammatory, or ischemic colitis. No evidence of obstruction or perforation. Electronically Signed   By: AFidela SalisburyM.D.   On: 04/09/2022 23:02    X-rays shows a mildly displaced fourth proximal phalangeal fracture with intra-articular extension.  Degenerative changes noted as well.  Based on the location of this toe could be buddy taped and probably postoperative orthopedic shoe.  Or perhaps a walking boot as needed.  Will discussed with patient.   ZFredia Sorrow MD 04/29/22 1653  Patient's increased warmth redness and inflammation is on the top of the foot.  Not anywhere close to where this proximal fourth toe fracture is.  Think there are 2 separate problems.  Patient without any known incident of injury to that toe.  Will buddy tape it and do postop shoe.  Patient has orthopedic provider that she cannot recall at this moment that she knows she has information at home for that she can call for follow-up.  Patient stable for discharge home.      Fredia Sorrow, MD 04/29/22 [phone]

## 2022-04-29 NOTE — Telephone Encounter (Signed)
Patient arrived at ED @ 11:42am on 04/29/22   Patient Name: Brittany Acosta Gender: Female DOB: 10-Apr-1950 Age: 72 Y 64 M 17 D Return Phone Number: [phone] (Primary) Address: City/ State/ Zip: [place]  [address] [facility] [place] [facility] Contact Type Call Who Is Calling Patient / Member / Family / Caregiver Call Type Triage / Clinical Caller Name Leda Quail Relationship To Patient [place] Return Phone Number [phone] (Primary) Chief Complaint Foot Pain Reason for Call Symptomatic / Request for [place] states she needs a patient triaged. She cannot walk using her left foot. Cannot apply any pressure without it hurting. Translation No Nurse Assessment Nurse: Vallery Sa, RN, Cathy Date/Time (Eastern Time): 04/29/2022 8:29:34 AM Confirm and document reason for call. If symptomatic, describe symptoms. ---Katharine Acosta states that she developed pain, redness and mild swelling in her left foot last night (current pain rated as an 8 on the 1 to 10 scale). No known injury in the past 3 days. No fever. Alert and responsive. Does the patient have any new or worsening symptoms? ---Yes Will a triage be completed? ---Yes Related visit to physician within the last 2 weeks? ---No Does the PT have any chronic conditions? (i.e. diabetes, asthma, this includes High risk factors for pregnancy, etc.) ---Yes List chronic conditions. ---Diabetes (no sore or ulceration of foot), Neuropathy, High Blood Pressure and Cholesterol Is this a behavioral health or substance abuse call? ---No Guidelines Guideline Title Affirmed Question Affirmed Notes Nurse Date/Time (Eastern Time) Diabetes - Foot Problems and Questions Severe pain [place], RN, Tye [place] 04/29/2022 8:32:01 AM PLEASE NOTE: All timestamps contained within this report are represented as [place] Standard  Time. CONFIDENTIALTY NOTICE: This fax transmission is intended only for the addressee. It contains information that is legally privileged, confidential or otherwise protected from use or disclosure. If you are not the intended recipient, you are strictly prohibited from reviewing, disclosing, copying using or disseminating any of this information or taking any action in reliance on or regarding this information. If you have received this fax in error, please notify us immediately by telephone so that we can arrange for its return to [place]. Phone: [phone], Toll-Free: [phone], Fax: [phone] Page: 2 of 2 Call Id: [id number] [place]. Time Brittany Acosta Time) Disposition Final User 04/29/2022 8:34:11 AM Go to ED Now Yes Vallery Sa, RN, Tye [place] Final Disposition 04/29/2022 8:34:11 AM Go to ED Now Yes Vallery Sa, RN, Rosey [place] Disagree/Comply Comply Caller Understands Yes PreDisposition Call Doctor Care Advice Given Per Guideline GO TO ED NOW: * You need to be seen in the Emergency Department. * Go to the ED at ___________ [place] now. Drive carefully. NOTE TO TRIAGER - DRIVING: * Another adult should drive. * Patient should not delay going to the emergency department. * If immediate transportation is not available via car, rideshare (e.g., Lyft, Uber), or taxi, then the patient should be instructed to call [id number]. BRING MEDICINES: * Bring a list of your current medicines when you go to the Emergency Department (ER). * Bring the pill bottles too. This will help the doctor (or NP/PA) to make certain you are taking the right medicines and the right dose. CARE ADVICE given per Diabetes - Foot Problems and Questions (Adult) guideline. Referrals [facility] - ED

## 2022-04-29 NOTE — ED Provider Notes (Signed)
[facility] Provider Note   CSN: [id number] Arrival date & time: 04/29/22  4825     History  Chief Complaint  Patient presents with   Foot Pain    Brittany Acosta is a 72 y.o. female.  HPI    72 year old female comes in with chief complaint of leg foot pain. Patient has history of CHF, gout.  Patient states that she went to bed feeling fine, but woke up with pain in the right foot.  She denies any nausea, vomiting, fevers, chills.  She denies any trauma.   Home Medications Prior to Admission medications   Medication Sig Start Date End Date Taking? Authorizing Provider  acetaminophen (TYLENOL) 500 MG tablet Take 1 tablet (500 mg total) by mouth every 6 (six) hours as needed. 04/29/22  Yes Cleon Signorelli, MD  cephALEXin (KEFLEX) 500 MG capsule Take 1 capsule (500 mg total) by mouth 4 (four) times daily. 04/29/22  Yes Varney Biles, MD  ibuprofen (ADVIL) 400 MG tablet Take 1 tablet (400 mg total) by mouth every 6 (six) hours as needed. 04/29/22  Yes Varney Biles, MD  allopurinol (ZYLOPRIM) 300 MG tablet Take 1 tablet (300 mg total) by mouth daily. 12/30/21   Tawnya Crook, MD  anastrozole (ARIMIDEX) 1 MG tablet TAKE 1 TABLET EVERY DAY Patient taking differently: Take 1 mg by mouth daily. 11/16/21   Tawnya Crook, MD  aspirin EC 81 MG tablet Take 1 tablet (81 mg total) by mouth daily. Swallow whole. 01/25/20   Buford Dresser, MD  Biotin 10000 MCG TABS Take 1 tablet by mouth daily in the afternoon.    [provider]  Black Elderberry,Berry-Flower, 575 MG CAPS Take 575 mg by mouth daily.    [provider]  calcium-vitamin D (OSCAL WITH D) 500-5 MG-MCG tablet Take 1 tablet by mouth.    [provider]  Chromium-Cinnamon (CINNAMON PLUS CHROMIUM PO) Take 2 tablets by mouth daily.    [provider]  cyclobenzaprine (FLEXERIL) 10 MG tablet TAKE 1 TABLET (10 MG TOTAL) BY MOUTH AT BEDTIME AS  NEEDED FOR MUSCLE SPASMS. 02/14/22   Tawnya Crook, MD  doxepin (SINEQUAN) 10 MG capsule Take 1 capsule (10 mg total) by mouth daily. Patient taking differently: Take 10 mg by mouth at bedtime. 12/30/21   Tawnya Crook, MD  fluticasone [facility]) 50 MCG/ACT nasal spray Place 2 sprays into both nostrils daily. 01/26/22   Tawnya Crook, MD  furosemide (LASIX) 40 MG tablet Take 1 tablet (40 mg total) by mouth daily. 01/26/22   Tawnya Crook, MD  glucose blood (TRUE METRIX BLOOD GLUCOSE TEST) test strip TEST BLOOD SUGAR EVERY DAY AND AS NEEDED 10/28/21   Tawnya Crook, MD  insulin degludec (TRESIBA FLEXTOUCH) 100 UNIT/ML FlexTouch Pen Inject 26 Units into the skin daily. 01/26/22   Tawnya Crook, MD  Insulin Pen Needle (DROPLET PEN NEEDLES) 31G X 5 MM MISC USE TO INJECT INSULIN DAILY 03/19/20   Inda Coke, PA  lamoTRIgine (LAMICTAL) 200 MG tablet TAKE 1 TABLET (200 MG TOTAL) BY MOUTH DAILY AT 12 NOON. 01/26/22   Tawnya Crook, MD  levothyroxine (SYNTHROID) 50 MCG tablet Take 1 tablet (50 mcg total) by mouth daily. 12/30/21   Tawnya Crook, MD  lisinopril (ZESTRIL) 5 MG tablet Take 1 tablet (5 mg total) by mouth daily. 01/26/22   Tawnya Crook, MD  loratadine (CLARITIN) 10 MG tablet Take 10 mg by mouth daily.  [provider]  metFORMIN (GLUCOPHAGE) 500 MG tablet Take 2 tablets (1,000 mg total) by mouth every morning AND 1 tablet (500 mg total) every evening. 01/04/22   Tawnya Crook, MD  morphine (MSIR) 15 MG tablet Take 0.5 tablets (7.5 mg total) by mouth every 4 (four) hours as needed for severe pain. Patient not taking: Reported on 04/14/2022 04/09/22   Deno Etienne, DO  omeprazole (PRILOSEC) 20 MG capsule TAKE 1 CAPSULE (20 MG TOTAL) BY MOUTH DAILY. TAKE 30 MINUTES BEFORE A MEAL 01/26/22   Tawnya Crook, MD  ondansetron (ZOFRAN-ODT) 4 MG disintegrating tablet '4mg'$  ODT q4 hours prn nausea/vomit 04/09/22   Deno Etienne, DO  oxybutynin (DITROPAN-XL) 10 MG 24 hr tablet  Take 1 tablet (10 mg total) by mouth at bedtime. 12/30/21   Tawnya Crook, MD  Potassium Chloride ER 20 MEQ TBCR Take 1 tablet by mouth daily. Patient not taking: Reported on 04/16/2022 12/31/21   Tawnya Crook, MD  potassium citrate (UROCIT-K) 10 MEQ (1080 MG) SR tablet Take 1 tablet (10 mEq total) by mouth daily in the afternoon. 12/30/21   Tawnya Crook, MD  pravastatin (PRAVACHOL) 20 MG tablet Take 1 tablet (20 mg total) by mouth at bedtime. 12/30/21   Tawnya Crook, MD  predniSONE (DELTASONE) 5 MG tablet Take 5 mg by mouth daily with breakfast. 12/07/19   [provider]  predniSONE (DELTASONE) 5 MG tablet Take 1 tablet (5 mg total) by mouth daily as directed 02/15/22     sertraline (ZOLOFT) 100 MG tablet Take 1 tablet (100 mg total) by mouth daily. 12/30/21   Tawnya Crook, MD  sulfaSALAzine (AZULFIDINE) 500 MG tablet Take 2 tablets by mouth 2 (two) times daily.    [provider]  tamsulosin (FLOMAX) 0.4 MG CAPS capsule Take 1 capsule (0.4 mg total) by mouth daily. 03/24/22   Tawnya Crook, MD      Allergies    Patient has no known allergies.    Review of Systems   Review of Systems  All other systems reviewed and are negative.   Physical Exam Updated Vital Signs BP (!) 126/47   Pulse 75   Temp 98.2 F (36.8 C) (Oral)   Resp (!) 23   Ht '5\' 1"'$  (1.549 m)   Wt 95.3 kg   SpO2 96%   BMI 39.68 kg/m  Physical Exam Vitals and nursing note reviewed.  Constitutional:      Appearance: She is well-developed.  HENT:     Head: Atraumatic.  Cardiovascular:     Rate and Rhythm: Normal rate.  Pulmonary:     Effort: Pulmonary effort is normal.  Musculoskeletal:        General: Swelling and tenderness present.     Cervical back: Normal range of motion and neck supple.     Comments: Left foot has edema, erythema over the dorsal aspect with tenderness to palpation  Skin:    General: Skin is warm and dry.     Findings: Erythema present.  Neurological:      Mental Status: She is alert and oriented to person, place, and time.     ED Results / Procedures / Treatments   Labs (all labs ordered are listed, but only abnormal results are displayed) Labs Reviewed - No data to display  EKG None  Radiology [place] Extrem Low Left Ltd  Result Date: 04/29/2022 CLINICAL DATA:  Foot swelling EXAM: ULTRASOUND LEFT LOWER EXTREMITY LIMITED TECHNIQUE: Ultrasound examination of the  lower extremity soft tissues was performed in the area of clinical concern. COMPARISON:  None Available. FINDINGS: Targeted ultrasound was performed of the soft tissues of the left lower extremity at site of patient's clinical concern at the dorsal foot. Mild edema within the subcutaneous soft tissues at this location. No fluid collection. The dorsalis pedis artery is noted within this location demonstrating atherosclerotic plaque. There also degenerative osteophytes noted within the underlying bony structures, evaluation of which is limited by ultrasound. IMPRESSION: 1. Mild edema within the subcutaneous soft tissues of the dorsal foot at site of patient's clinical concern. 2. Left dorsalis pedis artery is noted within this location demonstrating atherosclerotic plaque. 3. Degenerative osteophytes noted within the underlying bony structures, evaluation of which is limited by ultrasound. Electronically Signed   By: Davina Poke D.O.   On: 04/29/2022 15:13    Procedures Ultrasound ED Soft Tissue  Date/Time: 04/29/2022 4:13 PM  Performed by: Varney Biles, MD Authorized by: Varney Biles, MD   Procedure details:    Indications: limb pain and evaluate for cellulitis     Transverse view:  Visualized   Longitudinal view:  Visualized   Images: archived   Location:    Location: lower extremity     Side:  Left Findings:     no abscess present    cellulitis present    no foreign body present     Medications Ordered in ED Medications  naproxen (NAPROSYN) tablet 250 mg (250 mg  Oral Given 04/29/22 1220)  acetaminophen (TYLENOL) tablet 650 mg (650 mg Oral Given 04/29/22 1220)    ED Course/ Medical Decision Making/ A&P                             Medical Decision Making Amount and/or Complexity of Data Reviewed Radiology: ordered.  Risk OTC drugs. Prescription drug management.   72 year old female comes in with chief complaint of foot swelling and pain. Sudden onset pain.  Patient is noted to have a bounding pulse in that foot, with warm to touch and some swelling.  Differential diagnosis considered includes gout, pathologic fracture, Charcot's foot, cellulitis, cyst.  Bedside ultrasound negative for any mass, abscess, cyst.  Ultrasound was ordered given concerns for possible thrombosis. Ultrasound is negative for any concerning findings.  Most likely has atherosclerotic disease which was seen on ultrasound.  Will start patient on Keflex and have her follow-up with her PCP.  Final Clinical Impression(s) / ED Diagnoses Final diagnoses:  Cellulitis of foot    Rx / DC Orders ED Discharge Orders          Ordered    acetaminophen (TYLENOL) 500 MG tablet  Every 6 hours PRN        04/29/22 1609    ibuprofen (ADVIL) 400 MG tablet  Every 6 hours PRN        04/29/22 1609    cephALEXin (KEFLEX) 500 MG capsule  4 times daily        04/29/22 1609              Varney Biles, MD 04/29/22 1615

## 2022-04-30 ENCOUNTER — Ambulatory Visit: Payer: Medicare PPO | Admitting: Family Medicine

## 2022-04-30 ENCOUNTER — Other Ambulatory Visit (HOSPITAL_BASED_OUTPATIENT_CLINIC_OR_DEPARTMENT_OTHER): Payer: Self-pay

## 2022-05-07 ENCOUNTER — Ambulatory Visit (INDEPENDENT_AMBULATORY_CARE_PROVIDER_SITE_OTHER): Payer: PPO | Admitting: Family Medicine

## 2022-05-07 ENCOUNTER — Encounter: Payer: Self-pay | Admitting: Family Medicine

## 2022-05-07 VITALS — BP 133/62 | HR 78 | Temp 97.8°F | Ht 61.0 in | Wt 211.8 lb

## 2022-05-07 DIAGNOSIS — E1159 Type 2 diabetes mellitus with other circulatory complications: Secondary | ICD-10-CM

## 2022-05-07 DIAGNOSIS — S300XXA Contusion of lower back and pelvis, initial encounter: Secondary | ICD-10-CM | POA: Diagnosis not present

## 2022-05-07 DIAGNOSIS — E1140 Type 2 diabetes mellitus with diabetic neuropathy, unspecified: Secondary | ICD-10-CM | POA: Diagnosis not present

## 2022-05-07 DIAGNOSIS — E1165 Type 2 diabetes mellitus with hyperglycemia: Secondary | ICD-10-CM | POA: Diagnosis not present

## 2022-05-07 DIAGNOSIS — D500 Iron deficiency anemia secondary to blood loss (chronic): Secondary | ICD-10-CM

## 2022-05-07 DIAGNOSIS — L03119 Cellulitis of unspecified part of limb: Secondary | ICD-10-CM | POA: Diagnosis not present

## 2022-05-07 NOTE — Patient Instructions (Signed)
Call insurance for preferred potassium.

## 2022-05-07 NOTE — Progress Notes (Unsigned)
Subjective:     Patient ID: HANG AMMON, female    DOB: 12/10/1950, 72 y.o.   MRN: [id number]  Chief Complaint  Patient presents with   Follow-up    Pt fell a couple weeks ago, and knot still there on pt side   Diabetes    HPI  DM-running 41 this am 64 yest . Checks daily am-usu 100, later in day-up to 160-200(depends on diet). No lows. Metformin 1000 in am and 500 pm, tresiba 23 Fell 2 wks ago.  Still w/knot R buttock.  Fell in restroom-small area and R leg gave out.  Bruised R buttock-still w/knot.   Pain R foot-ER-cellulitis-keflex-finishing-better. Saw Pod-knots on feet-steroid shots in Jan. Had cscope-no bleed so egd on 3/8.   IDA-not taking iron-thought only short time.  Changed insurance and needs to change K  Health Maintenance Due  Topic Date Due   Medicare Annual Wellness (AWV)  09/04/2020   COVID-19 Vaccine (4 - 2023-24 season) 12/04/2021    Past Medical History:  Diagnosis Date   Allergic rhinitis, cause unspecified    Arthritis    Bipolar disorder, unspecified ([place])    depression   Cancer ([place])    skin cancer on left leg, left breast cancer   Cataract    Chronic diastolic heart failure ([place])    Depression    GERD (gastroesophageal reflux disease)    Headache    History of gout    "haven't had it in several years" (02/08/2012)   History of kidney stones    History of wrist fracture    rt wrist   Hypertension    Iron deficiency anemia    Kidney stones    sees urologist @ [facility]   Morbid obesity ([place])    Neuropathy due to secondary diabetes ([place])    Nontoxic multinodular goiter    Osteoporosis    Personal history of radiation therapy    Pneumonia    PONV (postoperative nausea and vomiting)    PTSD (post-traumatic stress disorder)    Pure hypercholesterolemia    RBBB    Scleroderma ([place])    Sleep apnea 10/2012   mild osa-did not need cpap -dr clance   Type II diabetes mellitus ([place])    Unspecified hypothyroidism     Past Surgical History:   Procedure Laterality Date   ABDOMINAL HYSTERECTOMY  1976   BREAST EXCISIONAL BIOPSY Left    BREAST LUMPECTOMY Left 04/25/2018   BREAST LUMPECTOMY WITH RADIOACTIVE SEED AND SENTINEL LYMPH NODE BIOPSY Left 04/25/2018   Procedure: LEFT BREAST LUMPECTOMY WITH RADIOACTIVE SEED AND SENTINEL LYMPH NODE BIOPSY;  Surgeon: Excell Seltzer, MD;  Location: [facility];  Service: General;  Laterality: Left;   [place]  2001   sees Dr Peter Martinique   CATARACT EXTRACTION  2014   CATARACT EXTRACTION, BILATERAL  02/2011   epps   CHOLECYSTECTOMY  1985   EXCISIONAL HEMORRHOIDECTOMY     "dr cut out in his office" (02/08/2012)   [place]  2000   right   JOINT REPLACEMENT     rt knee   KNEE ARTHROSCOPY  08/2009   right   KNEE ARTHROSCOPY WITH MEDIAL MENISECTOMY     left   Left Cystoscopy   [place]   Lithotripsy (L) Kidney  1997   LUMBAR LAMINECTOMY/DECOMPRESSION MICRODISCECTOMY Left 07/01/2016   Procedure: Laminectomy for synovial cyst - left - Lumbar four-five;  Surgeon: Earnie Larsson,  MD;  Location: [facility];  Service: Neurosurgery;  Laterality: Left;   PARTIAL MASTECTOMY WITH NEEDLE LOCALIZATION Left 09/01/2012   Procedure: PARTIAL MASTECTOMY WITH NEEDLE LOCALIZATION;  Surgeon: Adin Hector, MD;  Location: [facility];  Service: General;  Laterality: Left;   [place] stone removed (L) Kidney  1992   REVERSE SHOULDER ARTHROPLASTY Right 02/12/2021   Procedure: REVERSE SHOULDER ARTHROPLASTY;  Surgeon: Tania Ade, MD;  Location: [facility];  Service: Orthopedics;  Laterality: Right;   Right nasal surgery  08/1988   Right sinus removed  08/1989   tooth partial   ROTATOR CUFF REPAIR  2013   right shoulder x 2   SHOULDER ARTHROSCOPY WITH ROTATOR CUFF REPAIR AND SUBACROMIAL DECOMPRESSION Left 08/20/2013   Procedure: SHOULDER ARTHROSCOPY  AND SUBACROMIAL DECOMPRESSION;  Surgeon: Nita Sells, MD;  Location: [facility];  Service: Orthopedics;  Laterality: Left;  Left shoulder arthroscopy, debridement, subacromial decompression, distal clavical resection   THYROIDECTOMY  04/22/2011   Procedure: THYROIDECTOMY;  Surgeon: Onnie Graham, MD;  Location: [facility];  Service: ENT;  Laterality: N/A;  TOTAL THYROIDECOTMY   TOTAL KNEE ARTHROPLASTY  06/18/2011   Procedure: TOTAL KNEE ARTHROPLASTY;  Surgeon: Kerin Salen, MD;  Location: [facility];  Service: Orthopedics;  Laterality: Left;  DEPUY   TOTAL KNEE ARTHROPLASTY  02/07/2012   Procedure: TOTAL KNEE ARTHROPLASTY;  Surgeon: Kerin Salen, MD;  Location: [facility];  Service: Orthopedics;  Laterality: Right;   TUBAL LIGATION  1972    Outpatient Medications Prior to Visit  Medication Sig Dispense Refill   acetaminophen (TYLENOL) 500 MG tablet Take 1 tablet (500 mg total) by mouth every 6 (six) hours as needed. 30 tablet 0   allopurinol (ZYLOPRIM) 300 MG tablet Take 1 tablet (300 mg total) by mouth daily. 90 tablet 2   anastrozole (ARIMIDEX) 1 MG tablet TAKE 1 TABLET EVERY DAY (Patient taking differently: Take 1 mg by mouth daily.) 90 tablet 0   aspirin EC 81 MG tablet Take 1 tablet (81 mg total) by mouth daily. Swallow whole. 90 tablet 3   Black Elderberry,Berry-Flower, 575 MG CAPS Take 575 mg by mouth daily.     calcium-vitamin D (OSCAL WITH D) 500-5 MG-MCG tablet Take 1 tablet by mouth.     cephALEXin (KEFLEX) 500 MG capsule Take 1 capsule (500 mg total) by mouth 4 (four) times daily. 28 capsule 0   Chromium-Cinnamon (CINNAMON PLUS CHROMIUM PO) Take 2 tablets by mouth daily.     cyclobenzaprine (FLEXERIL) 10 MG tablet TAKE 1 TABLET (10 MG TOTAL) BY MOUTH AT BEDTIME AS NEEDED FOR MUSCLE SPASMS. 90 tablet 0   doxepin (SINEQUAN) 10 MG capsule Take 1 capsule (10 mg total) by mouth daily. (Patient taking differently: Take 10 mg by mouth at bedtime.) 90 capsule 0   fluticasone (FLONASE) 50 MCG/ACT nasal spray Place 2 sprays into both nostrils daily. 48 g 2   furosemide  (LASIX) 40 MG tablet Take 1 tablet (40 mg total) by mouth daily. 90 tablet 10   glucose blood (TRUE METRIX BLOOD GLUCOSE TEST) test strip TEST BLOOD SUGAR EVERY DAY AND AS NEEDED 200 strip 2   ibuprofen (ADVIL) 400 MG tablet Take 1 tablet (400 mg total) by mouth every 6 (six) hours as needed. 12 tablet 0   insulin degludec (TRESIBA FLEXTOUCH) 100 UNIT/ML FlexTouch Pen Inject 26 Units into the skin daily. 30 mL 2   Insulin Pen Needle (DROPLET PEN NEEDLES) 31G X 5 MM MISC USE TO  INJECT INSULIN DAILY 100 each 3   lamoTRIgine (LAMICTAL) 200 MG tablet TAKE 1 TABLET (200 MG TOTAL) BY MOUTH DAILY AT 12 NOON. 90 tablet 10   levothyroxine (SYNTHROID) 50 MCG tablet Take 1 tablet (50 mcg total) by mouth daily. 90 tablet 2   lisinopril (ZESTRIL) 5 MG tablet Take 1 tablet (5 mg total) by mouth daily. 90 tablet 10   loratadine (CLARITIN) 10 MG tablet Take 10 mg by mouth daily.     metFORMIN (GLUCOPHAGE) 500 MG tablet Take 2 tablets (1,000 mg total) by mouth every morning AND 1 tablet (500 mg total) every evening. 270 tablet 0   omeprazole (PRILOSEC) 20 MG capsule TAKE 1 CAPSULE (20 MG TOTAL) BY MOUTH DAILY. TAKE 30 MINUTES BEFORE A MEAL 90 capsule 10   ondansetron (ZOFRAN-ODT) 4 MG disintegrating tablet '4mg'$  ODT q4 hours prn nausea/vomit 20 tablet 0   oxybutynin (DITROPAN-XL) 10 MG 24 hr tablet Take 1 tablet (10 mg total) by mouth at bedtime. 90 tablet 3   Potassium Chloride ER 20 MEQ TBCR Take 1 tablet by mouth daily. 30 tablet 1   potassium citrate (UROCIT-K) 10 MEQ (1080 MG) SR tablet Take 1 tablet (10 mEq total) by mouth daily in the afternoon. 90 tablet 0   pravastatin (PRAVACHOL) 20 MG tablet Take 1 tablet (20 mg total) by mouth at bedtime. 90 tablet 2   predniSONE (DELTASONE) 5 MG tablet Take 5 mg by mouth daily with breakfast.     predniSONE (DELTASONE) 5 MG tablet Take 1 tablet (5 mg total) by mouth daily as directed 90 tablet 9   sertraline (ZOLOFT) 100 MG tablet Take 1 tablet (100 mg total) by mouth  daily. 90 tablet 0   sulfaSALAzine (AZULFIDINE) 500 MG tablet Take 2 tablets by mouth 2 (two) times daily.     tamsulosin (FLOMAX) 0.4 MG CAPS capsule Take 1 capsule (0.4 mg total) by mouth daily. 90 capsule 0   Biotin 10000 MCG TABS Take 1 tablet by mouth daily in the afternoon. (Patient not taking: Reported on 05/07/2022)     morphine (MSIR) 15 MG tablet Take 0.5 tablets (7.5 mg total) by mouth every 4 (four) hours as needed for severe pain. (Patient not taking: Reported on 04/14/2022) 3 tablet 0   No facility-administered medications prior to visit.    No Known Allergies ROS neg/noncontributory except as noted HPI/below      Objective:     BP 133/62 (BP Location: Left Arm, Patient Position: Sitting, Cuff Size: Large)   Pulse 78   Temp 97.8 F (36.6 C) (Temporal)   Ht '5\' 1"'$  (1.549 m)   Wt 211 lb 12.8 oz (96.1 kg)   SpO2 97%   BMI 40.02 kg/m  Wt Readings from Last 3 Encounters:  05/07/22 211 lb 12.8 oz (96.1 kg)  04/29/22 210 lb (95.3 kg)  04/14/22 220 lb (99.8 kg)    Physical Exam   Gen: WDWN NAD HEENT: NCAT, conjunctiva not injected, sclera nonicteric NECK:  supple, no thyromegaly, no nodes, no carotid bruits CARDIAC: RRR, occ ectopic beat, S1S2+,+murmur. DP 1+B LUNGS: CTAB. No wheezes ABDOMEN:  BS+, soft, NTND, No HSM, no masses EXT:  no edema MSK: no gross abnormalities.  NEURO: A&O x3.  CN II-XII intact.  PSYCH: normal mood. Good eye contact Very large hematoma R buttock     Assessment & Plan:   Problem List Items Addressed This Visit       Endocrine   Poorly controlled type 2 diabetes mellitus  with circulatory disorder ([place]) - Primary   Relevant Orders   Comprehensive metabolic panel (Completed)   Hemoglobin A1c (Completed)   Magnesium (Completed)   Uric acid (Completed)   Diabetic neuropathy, painful (H[facility])   Other Visit Diagnoses     Iron deficiency anemia due to chronic blood loss       Relevant Orders   CBC with Differential/Platelet (Completed)    Iron, TIBC and Ferritin Panel (Completed)   Traumatic hematoma of buttock, initial encounter       Cellulitis of foot         1.  Diabetes type 2 with multiple sequelae-chronic.  Fair control.  Continue meds.  Check A1c, CMP, magnesium, uric acid.  Work on diet/exercise.  She does see podiatry and ophthalmology. 2.  Iron deficiency anemia-colonoscopy was unrevealing.  She did not realize she was supposed to stay on iron.  She is scheduled for an EGD.  Will check CBC, iron studies. 3.  Traumatic hematoma to the right buttock-very large.  Advised that it is going to take many months to totally resolve.  If getting worse, other symptoms, let us know 4.  Cellulitis right foot-status post emergency visit.  Notes were reviewed.  Per patient, getting better on antibiotics.  Declined foot exam today. 5.  Diabetic neuropathy-off balance.  Falls occasionally.  Advise she needs to use a cane/walker.  (Did not have one with her today)  Follow-up 3 months or sooner if needed  No orders of the defined types were placed in this encounter.   Wellington Hampshire, MD

## 2022-05-08 LAB — COMPREHENSIVE METABOLIC PANEL
AG Ratio: 1.6 (calc) (ref 1.0–2.5)
ALT: 10 U/L (ref 6–29)
AST: 13 U/L (ref 10–35)
Albumin: 3.9 g/dL (ref 3.6–5.1)
Alkaline phosphatase (APISO): 85 U/L (ref 37–153)
BUN: 9 mg/dL (ref 7–25)
CO2: 31 mmol/L (ref 20–32)
Calcium: 9.4 mg/dL (ref 8.6–10.4)
Chloride: 96 mmol/L — ABNORMAL LOW (ref 98–110)
Creat: 0.62 mg/dL (ref 0.60–1.00)
Globulin: 2.5 g/dL (calc) (ref 1.9–3.7)
Glucose, Bld: 76 mg/dL (ref 65–99)
Potassium: 3.9 mmol/L (ref 3.5–5.3)
Sodium: 139 mmol/L (ref 135–146)
Total Bilirubin: 0.3 mg/dL (ref 0.2–1.2)
Total Protein: 6.4 g/dL (ref 6.1–8.1)

## 2022-05-08 LAB — IRON,TIBC AND FERRITIN PANEL
%SAT: 14 % (calc) — ABNORMAL LOW (ref 16–45)
Ferritin: 30 ng/mL (ref 16–288)
Iron: 40 ug/dL — ABNORMAL LOW (ref 45–160)
TIBC: 286 mcg/dL (calc) (ref 250–450)

## 2022-05-08 LAB — HEMOGLOBIN A1C
Hgb A1c MFr Bld: 6.7 % of total Hgb — ABNORMAL HIGH (ref <5.7)
Mean Plasma Glucose: 146 mg/dL
eAG (mmol/L): 8.1 mmol/L

## 2022-05-08 LAB — CBC WITH DIFFERENTIAL/PLATELET
Absolute Monocytes: 531 cells/uL (ref 200–950)
Basophils Absolute: 26 cells/uL (ref 0–200)
Basophils Relative: 0.3 %
Eosinophils Absolute: 148 cells/uL (ref 15–500)
Eosinophils Relative: 1.7 %
HCT: 31.5 % — ABNORMAL LOW (ref 35.0–45.0)
Hemoglobin: 10.3 g/dL — ABNORMAL LOW (ref 11.7–15.5)
Lymphs Abs: 1670 cells/uL (ref 850–3900)
MCH: 28.1 pg (ref 27.0–33.0)
MCHC: 32.7 g/dL (ref 32.0–36.0)
MCV: 86.1 fL (ref 80.0–100.0)
MPV: 9.1 fL (ref 7.5–12.5)
Monocytes Relative: 6.1 %
Neutro Abs: 6325 cells/uL (ref 1500–7800)
Neutrophils Relative %: 72.7 %
Platelets: 450 10*3/uL — ABNORMAL HIGH (ref 140–400)
RBC: 3.66 10*6/uL — ABNORMAL LOW (ref 3.80–5.10)
RDW: 15.6 % — ABNORMAL HIGH (ref 11.0–15.0)
Total Lymphocyte: 19.2 %
WBC: 8.7 10*3/uL (ref 3.8–10.8)

## 2022-05-08 LAB — URIC ACID: Uric Acid, Serum: 3.7 mg/dL (ref 2.5–7.0)

## 2022-05-08 LAB — MAGNESIUM: Magnesium: 1.5 mg/dL (ref 1.5–2.5)

## 2022-05-09 NOTE — Progress Notes (Signed)
Labs are ok except the anemia and iron being low-needs to get back on the iron daily and take stool softener if needed

## 2022-05-11 ENCOUNTER — Other Ambulatory Visit: Payer: Self-pay | Admitting: Physician Assistant

## 2022-05-11 ENCOUNTER — Other Ambulatory Visit: Payer: Self-pay | Admitting: Family Medicine

## 2022-05-11 ENCOUNTER — Other Ambulatory Visit (HOSPITAL_COMMUNITY): Payer: Self-pay

## 2022-05-11 MED ORDER — CYCLOBENZAPRINE HCL 10 MG PO TABS
10.0000 mg | ORAL_TABLET | Freq: Every evening | ORAL | 0 refills | Status: DC | PRN
  Filled 2022-05-19: qty 90, 90d supply, fill #0

## 2022-05-11 MED ORDER — SERTRALINE HCL 100 MG PO TABS
100.0000 mg | ORAL_TABLET | Freq: Every day | ORAL | 0 refills | Status: DC
  Filled 2022-05-19: qty 90, 90d supply, fill #0

## 2022-05-11 MED ORDER — ANASTROZOLE 1 MG PO TABS
1.0000 mg | ORAL_TABLET | Freq: Every day | ORAL | 0 refills | Status: DC
  Filled 2022-05-19: qty 90, 90d supply, fill #0

## 2022-05-11 NOTE — Telephone Encounter (Signed)
Patient stated that she would contact arthritis doctor for refills of prednisone and sulfaSALAzine. Remaining rx's sent to the pharmacy.

## 2022-05-13 ENCOUNTER — Other Ambulatory Visit: Payer: HMO

## 2022-05-19 ENCOUNTER — Other Ambulatory Visit: Payer: Self-pay

## 2022-05-19 ENCOUNTER — Encounter: Payer: Self-pay | Admitting: Podiatry

## 2022-05-19 ENCOUNTER — Ambulatory Visit (INDEPENDENT_AMBULATORY_CARE_PROVIDER_SITE_OTHER): Payer: HMO

## 2022-05-19 ENCOUNTER — Ambulatory Visit (INDEPENDENT_AMBULATORY_CARE_PROVIDER_SITE_OTHER): Payer: HMO | Admitting: Podiatry

## 2022-05-19 ENCOUNTER — Other Ambulatory Visit (HOSPITAL_COMMUNITY): Payer: Self-pay

## 2022-05-19 DIAGNOSIS — M2041 Other hammer toe(s) (acquired), right foot: Secondary | ICD-10-CM

## 2022-05-19 DIAGNOSIS — M778 Other enthesopathies, not elsewhere classified: Secondary | ICD-10-CM | POA: Diagnosis not present

## 2022-05-19 DIAGNOSIS — E1142 Type 2 diabetes mellitus with diabetic polyneuropathy: Secondary | ICD-10-CM

## 2022-05-19 DIAGNOSIS — M2042 Other hammer toe(s) (acquired), left foot: Secondary | ICD-10-CM

## 2022-05-19 MED ORDER — TRIAMCINOLONE ACETONIDE 40 MG/ML IJ SUSP
40.0000 mg | Freq: Once | INTRAMUSCULAR | Status: AC
  Administered 2022-05-19: 40 mg

## 2022-05-19 MED FILL — Tamsulosin HCl Cap 0.4 MG: ORAL | 90 days supply | Qty: 90 | Fill #0 | Status: AC

## 2022-05-19 MED FILL — Fluticasone Propionate Nasal Susp 50 MCG/ACT: NASAL | 90 days supply | Qty: 48 | Fill #0 | Status: AC

## 2022-05-19 MED FILL — Omeprazole Cap Delayed Release 20 MG: ORAL | 90 days supply | Qty: 90 | Fill #0 | Status: AC

## 2022-05-19 MED FILL — Insulin Degludec Soln Pen-Injector 100 Unit/ML: SUBCUTANEOUS | 115 days supply | Qty: 30 | Fill #0 | Status: AC

## 2022-05-19 MED FILL — Lisinopril Tab 5 MG: ORAL | 90 days supply | Qty: 90 | Fill #0 | Status: AC

## 2022-05-19 MED FILL — Lamotrigine Tab 200 MG: ORAL | 90 days supply | Qty: 90 | Fill #0 | Status: AC

## 2022-05-19 MED FILL — Metformin HCl Tab 500 MG: ORAL | 90 days supply | Qty: 270 | Fill #0 | Status: AC

## 2022-05-19 MED FILL — Furosemide Tab 40 MG: ORAL | 90 days supply | Qty: 90 | Fill #0 | Status: AC

## 2022-05-19 NOTE — Progress Notes (Signed)
She presents today after not seen her since August space stating that she has pain across the dorsum of each foot.  Has recently had a bout of cellulitis and was seen in the ED and was treated.  Objective: Vital signs stable alert oriented x 3 she has pain on palpation on dorsal aspect of the bilateral foot.  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  Plan: Discussed etiology pathology and surgical therapies at this point injected bilateral foot across the dorsum of the foot.  Tolerated procedure well 10 mg i Kenalog was njected each foot.  Follow-up with her

## 2022-05-20 ENCOUNTER — Encounter (HOSPITAL_BASED_OUTPATIENT_CLINIC_OR_DEPARTMENT_OTHER): Payer: Self-pay | Admitting: Cardiology

## 2022-05-20 ENCOUNTER — Telehealth: Payer: Self-pay | Admitting: Pharmacist

## 2022-05-20 ENCOUNTER — Ambulatory Visit (INDEPENDENT_AMBULATORY_CARE_PROVIDER_SITE_OTHER): Payer: HMO | Admitting: Cardiology

## 2022-05-20 VITALS — BP 132/68 | HR 72 | Ht 61.0 in | Wt 211.0 lb

## 2022-05-20 DIAGNOSIS — Z7189 Other specified counseling: Secondary | ICD-10-CM

## 2022-05-20 DIAGNOSIS — E669 Obesity, unspecified: Secondary | ICD-10-CM

## 2022-05-20 DIAGNOSIS — R600 Localized edema: Secondary | ICD-10-CM

## 2022-05-20 DIAGNOSIS — E1169 Type 2 diabetes mellitus with other specified complication: Secondary | ICD-10-CM

## 2022-05-20 DIAGNOSIS — Z8249 Family history of ischemic heart disease and other diseases of the circulatory system: Secondary | ICD-10-CM

## 2022-05-20 DIAGNOSIS — I10 Essential (primary) hypertension: Secondary | ICD-10-CM

## 2022-05-20 NOTE — Progress Notes (Signed)
Care Management & Coordination Services Pharmacy Team  Reason for Encounter: Diabetes  Contacted patient to discuss diabetes disease state. Spoke with patient on 05/20/2022   Current antihyperglycemic regimen:  Tresiba 23 units daily Metformin 500 mg 1 tablet every morning, 1 tablet every evening   Patient verbally confirms she is taking the above medications as directed. Yes  What diet changes have been made to improve diabetes control? Patient states her appetite has decreased.  What recent interventions/DTPs have been made to improve glycemic control:  Patient states she has decreased [place] to 23 units from 26 units and has decreased Metformin 500 mg to one tablet every morning instead of two. Patient is unsure of who suggested said decrease of medications.  Have there been any recent hospitalizations or ED visits since last visit with PharmD? Yes  Patient denies hypoglycemic symptoms.  Patient denies hyperglycemic symptoms.  How often are you checking your blood sugar? twice daily  What are your blood sugars ranging?  Fasting: 130 Before meals: 130  During the week, how often does your blood glucose drop below 70? Never  Are you checking your feet daily/regularly? Yes  Adherence Review: Is the patient currently on a STATIN medication? Yes Is the patient currently on ACE/ARB medication? Yes Does the patient have >5 day gap between last estimated fill dates? No  -Patient states she doesn't have nausea as much as she did. However, she states the smell of food cooking makes her feel nauseous.  Chart Updates:  Recent office visits:  05/07/2022 OV (PCP) Tawnya Crook, MD; no medication changes indicated.  Recent consult visits:  05/19/2022 OV (Podiatry) [place], [place] T, [place]; no medication changes indicated.  Hospital visits:  04/29/2022 ED visit for Cellulitis of foot Will start patient on Keflex and have her follow-up with her PCP.   04/09/2022 ED visit for  Colitis  Medications: Outpatient Encounter Medications as of 05/20/2022  Medication Sig   acetaminophen (TYLENOL) 500 MG tablet Take 1 tablet (500 mg total) by mouth every 6 (six) hours as needed.   allopurinol (ZYLOPRIM) 300 MG tablet Take 1 tablet (300 mg total) by mouth daily.   anastrozole (ARIMIDEX) 1 MG tablet Take 1 tablet (1 mg total) by mouth daily.   aspirin EC 81 MG tablet Take 1 tablet (81 mg total) by mouth daily. Swallow whole.   Black Elderberry,Berry-Flower, 575 MG CAPS Take 575 mg by mouth daily.   calcium-vitamin D (OSCAL WITH D) 500-5 MG-MCG tablet Take 1 tablet by mouth.   Chromium-Cinnamon (CINNAMON PLUS CHROMIUM PO) Take 2 tablets by mouth daily.   cyclobenzaprine (FLEXERIL) 10 MG tablet Take 1 tablet (10 mg total) by mouth at bedtime as needed for muscle spasms.   doxepin (SINEQUAN) 10 MG capsule Take 1 capsule (10 mg total) by mouth daily. (Patient taking differently: Take 10 mg by mouth at bedtime.)   fluticasone (FLONASE) 50 MCG/ACT nasal spray Place 2 sprays into both nostrils daily.   furosemide (LASIX) 40 MG tablet Take 1 tablet (40 mg total) by mouth daily.   glucose blood (TRUE METRIX BLOOD GLUCOSE TEST) test strip TEST BLOOD SUGAR EVERY DAY AND AS NEEDED   ibuprofen (ADVIL) 400 MG tablet Take 1 tablet (400 mg total) by mouth every 6 (six) hours as needed.   insulin degludec (TRESIBA FLEXTOUCH) 100 UNIT/ML FlexTouch Pen Inject 26 Units into the skin daily.   Insulin Pen Needle (DROPLET PEN NEEDLES) 31G X 5 MM MISC USE TO INJECT INSULIN DAILY   lamoTRIgine (  LAMICTAL) 200 MG tablet TAKE 1 TABLET (200 MG TOTAL) BY MOUTH DAILY AT 12 NOON.   levothyroxine (SYNTHROID) 50 MCG tablet Take 1 tablet (50 mcg total) by mouth daily.   lisinopril (ZESTRIL) 5 MG tablet Take 1 tablet (5 mg total) by mouth daily.   loratadine (CLARITIN) 10 MG tablet Take 10 mg by mouth daily.   metFORMIN (GLUCOPHAGE) 500 MG tablet Take 2 tablets (1,000 mg total) by mouth every morning AND 1  tablet (500 mg total) every evening.   omeprazole (PRILOSEC) 20 MG capsule TAKE 1 CAPSULE (20 MG TOTAL) BY MOUTH DAILY. TAKE 30 MINUTES BEFORE A MEAL   ondansetron (ZOFRAN-ODT) 4 MG disintegrating tablet 49m ODT q4 hours prn nausea/vomit   oxybutynin (DITROPAN-XL) 10 MG 24 hr tablet Take 1 tablet (10 mg total) by mouth at bedtime.   Potassium Chloride ER 20 MEQ TBCR Take 1 tablet by mouth daily.   potassium citrate (UROCIT-K) 10 MEQ (1080 MG) SR tablet Take 1 tablet (10 mEq total) by mouth daily in the afternoon.   pravastatin (PRAVACHOL) 20 MG tablet Take 1 tablet (20 mg total) by mouth at bedtime.   predniSONE (DELTASONE) 5 MG tablet Take 5 mg by mouth daily with breakfast.   predniSONE (DELTASONE) 5 MG tablet Take 1 tablet (5 mg total) by mouth daily as directed   sertraline (ZOLOFT) 100 MG tablet Take 1 tablet (100 mg total) by mouth daily.   sulfaSALAzine (AZULFIDINE) 500 MG tablet Take 2 tablets by mouth 2 (two) times daily.   tamsulosin (FLOMAX) 0.4 MG CAPS capsule Take 1 capsule (0.4 mg total) by mouth daily.   No facility-administered encounter medications on file as of 05/20/2022.    Recent Relevant Labs: Lab Results  Component Value Date/Time   HGBA1C 6.7 (H) 05/07/2022 03:10 PM   HGBA1C 6.9 (H) 12/30/2021 10:57 AM   MICROALBUR 1.4 08/28/2021 10:59 AM   MICROALBUR <0.7 06/26/2015 12:13 PM    Kidney Function Lab Results  Component Value Date/Time   CREATININE 0.62 05/07/2022 03:10 PM   CREATININE 0.77 04/09/2022 06:18 PM   CREATININE 0.65 12/30/2021 10:57 AM   GFR 88.60 12/30/2021 10:57 AM   GFRNONAA >60 04/09/2022 06:18 PM   GFRAA >60 09/14/2019 02:22 PM    Star Rating Drugs:  TTyler AasFlextouch last filled 05/19/2022 115 DS Lisinopril 5 mg last filled 05/19/2022 90 DS Metformin 500 mg last filled 05/19/2022 90 DS Pravastatin 20 mg last filled 05/19/2022 90 DS   Care Gaps: Annual wellness visit in last year? No Last eye exam / retinopathy screening: 06/03/2021 Last  diabetic foot exam: 09/22/2021   Future Appointments  Date Time Provider [place] 05/20/2022  4:20 PM CBuford Dresser MD DWB-CVD DWB  06/07/2022  5:00 PM GI-BCG MM 3 GI-BCGMM GI-BREAST CE  06/11/2022 10:00 AM CDaryel November MD LBGI-LEC LBPCEndo  09/27/2022  2:45 PM DEdythe Clarity RForest [place] VillageNone   April D Calhoun, CCordelePharmacist Assistant [phone]

## 2022-05-20 NOTE — Patient Instructions (Signed)
Medication Instructions:  Your Physician recommend you continue on your current medication as directed.    *If you need a refill on your cardiac medications before your next appointment, please call your pharmacy*  Follow-Up: At [facility], you and your health needs are our priority.  As part of our continuing mission to provide you with exceptional heart care, we have created designated Provider Care Teams.  These Care Teams include your primary Cardiologist (physician) and Advanced Practice Providers (APPs -  Physician Assistants and Nurse Practitioners) who all work together to provide you with the care you need, when you need it.  We recommend signing up for the patient portal called "MyChart".  Sign up information is provided on this After Visit Summary.  MyChart is used to connect with patients for Virtual Visits (Telemedicine).  Patients are able to view lab/test results, encounter notes, upcoming appointments, etc.  Non-urgent messages can be sent to your provider as well.   To learn more about what you can do with MyChart, go to [email or web address].    Your next appointment:   1 year(s)  Provider:   Buford Dresser, MD

## 2022-05-20 NOTE — Progress Notes (Signed)
Cardiology Office Note:    Date:  05/20/2022   ID:  Brittany Acosta, DOB March 04, 1951, MRN [id number]  PCP:  Tawnya Crook, MD  Cardiologist:  Buford Dresser, MD  Referring MD: Tawnya Crook, MD   CC: Follow up  History of Present Illness:    Brittany Acosta is a 72 y.o. female with a hx of hypertension, OSA, type II diabetes with diabetic neuropathy, hypercholesterolemia, obesity, fibromyalgia who is seen for follow up  Last visit she complained of constant diffuse muscle pain across her shoulders, it was beginning to improve with physical therapy. Since she had a fall she noted more LE edema, right worse than left. The edema improves if elevated. She feels that lasix doesn't do much for her swelling. She has been having more issues with varicose veins. She also complains of dyspnea on exertion with walking, and she is too anxious about falling again to walk more often.  Today, the patient states that she has been doing ok. She has been having a lot going on and had two ED admissions for colitis and cellulitis of foot. She denies any SOB but still has some slight BLE edema. She states that her blood pressure has been usually fairly well controlled but is sometimes slightly high. She presented in clinic today with a pressure of 140/80 but upon recheck it was 132/68.  She denies any palpitations, chest pain, shortness of breath, or peripheral edema. No lightheadedness, headaches, syncope, orthopnea, or PND.  Past Medical History:  Diagnosis Date   Allergic rhinitis, cause unspecified    Arthritis    Bipolar disorder, unspecified ([place])    depression   Cancer ([place])    skin cancer on left leg, left breast cancer   Cataract    Chronic diastolic heart failure (HCC)    Depression    GERD (gastroesophageal reflux disease)    Headache    History of gout    "haven't had it in several years" (02/08/2012)   History of kidney stones    History of wrist fracture    rt wrist    Hypertension    Iron deficiency anemia    Kidney stones    sees urologist @ [facility]   Morbid obesity ([place])    Neuropathy due to secondary diabetes ([place])    Nontoxic multinodular goiter    Osteoporosis    Personal history of radiation therapy    Pneumonia    PONV (postoperative nausea and vomiting)    PTSD (post-traumatic stress disorder)    Pure hypercholesterolemia    RBBB    Scleroderma ([place])    Sleep apnea 10/2012   mild osa-did not need cpap -dr clance   Type II diabetes mellitus ([place])    Unspecified hypothyroidism     Past Surgical History:  Procedure Laterality Date   ABDOMINAL HYSTERECTOMY  1976   BREAST EXCISIONAL BIOPSY Left    BREAST LUMPECTOMY Left 04/25/2018   BREAST LUMPECTOMY WITH RADIOACTIVE SEED AND SENTINEL LYMPH NODE BIOPSY Left 04/25/2018   Procedure: LEFT BREAST LUMPECTOMY WITH RADIOACTIVE SEED AND SENTINEL LYMPH NODE BIOPSY;  Surgeon: Excell Seltzer, MD;  Location: [facility];  Service: General;  Laterality: Left;   [place]  2001   sees Dr Peter Martinique   CATARACT EXTRACTION  2014   CATARACT EXTRACTION, BILATERAL  02/2011   epps   CHOLECYSTECTOMY  1985   EXCISIONAL HEMORRHOIDECTOMY     "dr cut out in his office" (02/08/2012)  INCISIONAL BREAST BIOPSY  2000   right   JOINT REPLACEMENT     rt knee   KNEE ARTHROSCOPY  08/2009   right   KNEE ARTHROSCOPY WITH MEDIAL MENISECTOMY     left   Left Cystoscopy   1990   LEFT OOPHORECTOMY  1980   Lithotripsy (L) Kidney  1997   LUMBAR LAMINECTOMY/DECOMPRESSION MICRODISCECTOMY Left 07/01/2016   Procedure: Laminectomy for synovial cyst - left - Lumbar four-five;  Surgeon: Earnie Larsson, MD;  Location: [facility];  Service: Neurosurgery;  Laterality: Left;   PARTIAL MASTECTOMY WITH NEEDLE LOCALIZATION Left 09/01/2012   Procedure: PARTIAL MASTECTOMY WITH NEEDLE LOCALIZATION;  Surgeon: Adin Hector, MD;  Location: [facility];  Service: General;  Laterality: Left;   [place] stone removed  (L) Kidney  1992   REVERSE SHOULDER ARTHROPLASTY Right 02/12/2021   Procedure: REVERSE SHOULDER ARTHROPLASTY;  Surgeon: Tania Ade, MD;  Location: [facility];  Service: Orthopedics;  Laterality: Right;   Right nasal surgery  08/1988   Right sinus removed  08/1989   tooth partial   ROTATOR CUFF REPAIR  2013   right shoulder x 2   SHOULDER ARTHROSCOPY WITH ROTATOR CUFF REPAIR AND SUBACROMIAL DECOMPRESSION Left 08/20/2013   Procedure: SHOULDER ARTHROSCOPY  AND SUBACROMIAL DECOMPRESSION;  Surgeon: Nita Sells, MD;  Location: [facility];  Service: Orthopedics;  Laterality: Left;  Left shoulder arthroscopy, debridement, subacromial decompression, distal clavical resection   THYROIDECTOMY  04/22/2011   Procedure: THYROIDECTOMY;  Surgeon: Onnie Graham, MD;  Location: [facility];  Service: ENT;  Laterality: N/A;  TOTAL THYROIDECOTMY   TOTAL KNEE ARTHROPLASTY  06/18/2011   Procedure: TOTAL KNEE ARTHROPLASTY;  Surgeon: Kerin Salen, MD;  Location: [facility];  Service: Orthopedics;  Laterality: Left;  DEPUY   TOTAL KNEE ARTHROPLASTY  02/07/2012   Procedure: TOTAL KNEE ARTHROPLASTY;  Surgeon: Kerin Salen, MD;  Location: [facility];  Service: Orthopedics;  Laterality: Right;   TUBAL LIGATION  1972    Current Medications: Current Outpatient Medications on File Prior to Visit  Medication Sig   acetaminophen (TYLENOL) 500 MG tablet Take 1 tablet (500 mg total) by mouth every 6 (six) hours as needed.   allopurinol (ZYLOPRIM) 300 MG tablet Take 1 tablet (300 mg total) by mouth daily.   anastrozole (ARIMIDEX) 1 MG tablet Take 1 tablet (1 mg total) by mouth daily.   aspirin EC 81 MG tablet Take 1 tablet (81 mg total) by mouth daily. Swallow whole.   Black Elderberry,Berry-Flower, 575 MG CAPS Take 575 mg by mouth daily.   calcium-vitamin D (OSCAL WITH D) 500-5 MG-MCG tablet Take 1 tablet by mouth.   Chromium-Cinnamon (CINNAMON PLUS CHROMIUM PO) Take 2 tablets by mouth daily.    cyclobenzaprine (FLEXERIL) 10 MG tablet Take 1 tablet (10 mg total) by mouth at bedtime as needed for muscle spasms.   doxepin (SINEQUAN) 10 MG capsule Take 1 capsule (10 mg total) by mouth daily. (Patient taking differently: Take 10 mg by mouth at bedtime.)   fluticasone (FLONASE) 50 MCG/ACT nasal spray Place 2 sprays into both nostrils daily.   furosemide (LASIX) 40 MG tablet Take 1 tablet (40 mg total) by mouth daily.   glucose blood (TRUE METRIX BLOOD GLUCOSE TEST) test strip TEST BLOOD SUGAR EVERY DAY AND AS NEEDED   ibuprofen (ADVIL) 400 MG tablet Take 1 tablet (400 mg total) by mouth every 6 (six) hours as needed.   insulin degludec (TRESIBA FLEXTOUCH) 100 UNIT/ML FlexTouch Pen Inject 26 Units  into the skin daily.   Insulin Pen Needle (DROPLET PEN NEEDLES) 31G X 5 MM MISC USE TO INJECT INSULIN DAILY   lamoTRIgine (LAMICTAL) 200 MG tablet TAKE 1 TABLET (200 MG TOTAL) BY MOUTH DAILY AT 12 NOON.   levothyroxine (SYNTHROID) 50 MCG tablet Take 1 tablet (50 mcg total) by mouth daily.   lisinopril (ZESTRIL) 5 MG tablet Take 1 tablet (5 mg total) by mouth daily.   loratadine (CLARITIN) 10 MG tablet Take 10 mg by mouth daily.   metFORMIN (GLUCOPHAGE) 500 MG tablet Take 2 tablets (1,000 mg total) by mouth every morning AND 1 tablet (500 mg total) every evening.   omeprazole (PRILOSEC) 20 MG capsule TAKE 1 CAPSULE (20 MG TOTAL) BY MOUTH DAILY. TAKE 30 MINUTES BEFORE A MEAL   ondansetron (ZOFRAN-ODT) 4 MG disintegrating tablet 4mg  ODT q4 hours prn nausea/vomit   oxybutynin (DITROPAN-XL) 10 MG 24 hr tablet Take 1 tablet (10 mg total) by mouth at bedtime.   Potassium Chloride ER 20 MEQ TBCR Take 1 tablet by mouth daily.   potassium citrate (UROCIT-K) 10 MEQ (1080 MG) SR tablet Take 1 tablet (10 mEq total) by mouth daily in the afternoon.   pravastatin (PRAVACHOL) 20 MG tablet Take 1 tablet (20 mg total) by mouth at bedtime.   predniSONE (DELTASONE) 5 MG tablet Take 5 mg by mouth daily with breakfast.    predniSONE (DELTASONE) 5 MG tablet Take 1 tablet (5 mg total) by mouth daily as directed   sertraline (ZOLOFT) 100 MG tablet Take 1 tablet (100 mg total) by mouth daily.   sulfaSALAzine (AZULFIDINE) 500 MG tablet Take 2 tablets by mouth 2 (two) times daily.   tamsulosin (FLOMAX) 0.4 MG CAPS capsule Take 1 capsule (0.4 mg total) by mouth daily.   No current facility-administered medications on file prior to visit.     Allergies:   Patient has no known allergies.   Social History   Tobacco Use   Smoking status: Former    Packs/day: 1.00    Years: 6.00    Total pack years: 6.00    Types: Cigarettes    Quit date: 04/06/1983    Years since quitting: 39.1   Smokeless tobacco: Never   Tobacco comments:    Married, lives with spouse. Disable- 2 grown kids-6 g-kids  Vaping Use   Vaping Use: Never used  Substance Use Topics   Alcohol use: No    Comment: none   Drug use: No    Family History: family history includes Coronary artery disease in an other family member; Diabetes in her daughter and father; Heart attack in an other family member; Hyperlipidemia in her father; Hypertension in her brother, brother, brother, father, mother, sister, sister, and sister; Lung cancer in her mother; Stomach cancer in her maternal grandmother. There is no history of Colon cancer, Esophageal cancer, Liver cancer, Pancreatic cancer, Rectal cancer, or Colon polyps. Mother had MI during childbirth with patient's youngest sibling. Died in her 65s. Several people on mother's side with heart disease. Father's side (7 or 8 brothers or sisters) with heart disease. Father died age 50 of an MI, had several MI prior.   ROS:   Please see the history of present illness.   (+) Slight BLE edema Additional pertinent ROS otherwise unremarkable   EKGs/Labs/Other Studies Reviewed:    The following studies were reviewed today: Myoview 2012: normal Echo 2012:  - Left ventricle: The cavity size was normal. Wall thickness     was normal. Systolic  function was normal. The estimated    ejection fraction was in the range of 55% to 60%. Wall    motion was normal; there were no regional wall motion    abnormalities. Doppler parameters are consistent with    abnormal left ventricular relaxation (grade 1 diastolic    dysfunction).  - Pericardium, extracardiac: A trivial pericardial effusion    was identified.   EKG:  EKG is personally reviewed.   05/20/2022: NSR with RBBB, LAFB at at 72 bpm 01/25/2020: NSR with RBBB, LAFB at 65 bpm  Recent Labs: 08/28/2021: TSH 2.67 05/07/2022: ALT 10; BUN 9; Creat 0.62; Hemoglobin 10.3; Magnesium 1.5; Platelets 450; Potassium 3.9; Sodium 139  Recent Lipid Panel    Component Value Date/Time   CHOL 135 08/28/2021 1059   CHOL 168 01/25/2020 1145   TRIG 67.0 08/28/2021 1059   HDL 60.60 08/28/2021 1059   HDL 59 01/25/2020 1145   CHOLHDL 2 08/28/2021 1059   VLDL 13.4 08/28/2021 1059   LDLCALC 61 08/28/2021 1059   LDLCALC 92 01/25/2020 1145   LDLDIRECT 56.4 06/11/2011 0931    Physical Exam:    VS:  BP 132/68 (BP Location: Right Arm, Patient Position: Sitting, Cuff Size: Normal)   Pulse 72   Ht 5\' 1"  (1.549 m)   Wt 211 lb (95.7 kg)   BMI 39.87 kg/m     Wt Readings from Last 3 Encounters:  05/07/22 211 lb 12.8 oz (96.1 kg)  04/29/22 210 lb (95.3 kg)  04/14/22 220 lb (99.8 kg)    GEN: Well nourished, well developed in no acute distress HEENT: Normal, moist mucous membranes NECK: No JVD CARDIAC: regular rhythm, normal S1 and S2, no rubs or gallops. No murmurs. VASCULAR: Radial and DP pulses 2+ bilaterally. Carotid bruit not appreciated today RESPIRATORY:  Clear to auscultation without rales, wheezing or rhonchi  ABDOMEN: Soft, non-tender, non-distended MUSCULOSKELETAL:  Ambulates independently SKIN: Warm and dry. Bilateral trace to 1+ edema, right slightly more than left  NEUROLOGIC:  Alert and oriented x 3. No focal neuro deficits noted. PSYCHIATRIC:  Normal affect     ASSESSMENT:    1. Essential hypertension   2. Bilateral leg edema   3. Diabetes mellitus type 2 in obese ([place])   4. Family history of heart disease   5. Cardiac risk counseling   6. Counseling on health promotion and disease prevention     PLAN:    Lower extremity edema -most consistent with venous insufficiency -reviewed prior testing -counseled on compression stockings, elevation  Family history of CV disease Type II diabetes, with obesity -continue aspirin -she is on pravastatin. We discussed changing to a higher intensity statin, but she is having diffuse myalgias at baseline and is worried about these worsening with changing statin. Continue to discuss  Hypertension: improved on recheck today -continue lisinopril  Abnormal ECG: RBBB, LAFB (bifasicular block) -no syncope or red flag symptoms  R carotid bruit, prior -not heard today -duplex without plaque/stenosis  Cardiac risk counseling and prevention recommendations: -recommend heart healthy/Mediterranean diet, with whole grains, fruits, vegetable, fish, lean meats, nuts, and olive oil. Limit salt. -recommend moderate walking, 3-5 times/week for 30-50 minutes each session. Aim for at least 150 minutes.week. Goal should be pace of 3 miles/hours, or walking 1.5 miles in 30 minutes -recommend avoidance of tobacco products. Avoid excess alcohol. -ASCVD risk score: The 10-year ASCVD risk score (Arnett DK, et al., 2019) is: 24.4%   Values used to calculate the score:     Age:  56 years     Sex: Female     Is Non-Hispanic African American: No     Diabetic: Yes     Tobacco smoker: No     Systolic Blood Pressure: 518 mmHg     Is BP treated: Yes     HDL Cholesterol: 60.6 mg/dL     Total Cholesterol: 135 mg/dL    Plan for follow up: 1 year  Buford Dresser, MD, PhD [place]  [facility]    Medication Adjustments/Labs and Tests Ordered: Current medicines are reviewed at length with the patient today.   Concerns regarding medicines are outlined above.  Orders Placed This Encounter  Procedures   EKG 12-Lead   No orders of the defined types were placed in this encounter.  Patient Instructions  Medication Instructions:  Your Physician recommend you continue on your current medication as directed.    *If you need a refill on your cardiac medications before your next appointment, please call your pharmacy*  Follow-Up: At [facility], you and your health needs are our priority.  As part of our continuing mission to provide you with exceptional heart care, we have created designated Provider Care Teams.  These Care Teams include your primary Cardiologist (physician) and Advanced Practice Providers (APPs -  Physician Assistants and Nurse Practitioners) who all work together to provide you with the care you need, when you need it.  We recommend signing up for the patient portal called "MyChart".  Sign up information is provided on this After Visit Summary.  MyChart is used to connect with patients for Virtual Visits (Telemedicine).  Patients are able to view lab/test results, encounter notes, upcoming appointments, etc.  Non-urgent messages can be sent to your provider as well.   To learn more about what you can do with MyChart, go to [email or web address].    Your next appointment:   1 year(s)  Provider:   Buford Dresser, MD    I,Coren O'Brien,acting as a scribe for Buford Dresser, MD.,have documented all relevant documentation on the behalf of Buford Dresser, MD,as directed by  Buford Dresser, MD while in the presence of Buford Dresser, MD.  I, Buford Dresser, MD, have reviewed all documentation for this visit. The documentation on 06/20/22 for the exam, diagnosis, procedures, and orders are all accurate and complete.

## 2022-05-24 ENCOUNTER — Other Ambulatory Visit (HOSPITAL_COMMUNITY): Payer: Self-pay

## 2022-05-24 DIAGNOSIS — G629 Polyneuropathy, unspecified: Secondary | ICD-10-CM | POA: Diagnosis not present

## 2022-05-24 DIAGNOSIS — L603 Nail dystrophy: Secondary | ICD-10-CM | POA: Diagnosis not present

## 2022-05-24 DIAGNOSIS — M1990 Unspecified osteoarthritis, unspecified site: Secondary | ICD-10-CM | POA: Diagnosis not present

## 2022-05-24 DIAGNOSIS — Z13820 Encounter for screening for osteoporosis: Secondary | ICD-10-CM | POA: Diagnosis not present

## 2022-05-24 DIAGNOSIS — M797 Fibromyalgia: Secondary | ICD-10-CM | POA: Diagnosis not present

## 2022-05-24 DIAGNOSIS — L4050 Arthropathic psoriasis, unspecified: Secondary | ICD-10-CM | POA: Diagnosis not present

## 2022-05-24 DIAGNOSIS — Z79899 Other long term (current) drug therapy: Secondary | ICD-10-CM | POA: Diagnosis not present

## 2022-05-24 DIAGNOSIS — R768 Other specified abnormal immunological findings in serum: Secondary | ICD-10-CM | POA: Diagnosis not present

## 2022-05-24 DIAGNOSIS — M109 Gout, unspecified: Secondary | ICD-10-CM | POA: Diagnosis not present

## 2022-05-24 NOTE — Progress Notes (Signed)
Patient presents today to pick up custom molded foot orthotics recommended by Dr. Milinda Pointer.   Orthotics were dispensed and fit was satisfactory. Reviewed instructions for break-in and wear. Written instructions given to patient.  Patient will follow up as needed.

## 2022-06-07 ENCOUNTER — Ambulatory Visit
Admission: RE | Admit: 2022-06-07 | Discharge: 2022-06-07 | Disposition: A | Discharge status: Home / Self Care | Payer: HMO | Source: Ambulatory Visit | Attending: Family Medicine | Admitting: Family Medicine

## 2022-06-07 DIAGNOSIS — E119 Type 2 diabetes mellitus without complications: Secondary | ICD-10-CM | POA: Diagnosis not present

## 2022-06-07 DIAGNOSIS — H43812 Vitreous degeneration, left eye: Secondary | ICD-10-CM | POA: Diagnosis not present

## 2022-06-07 DIAGNOSIS — D3132 Benign neoplasm of left choroid: Secondary | ICD-10-CM | POA: Diagnosis not present

## 2022-06-07 DIAGNOSIS — Z1231 Encounter for screening mammogram for malignant neoplasm of breast: Secondary | ICD-10-CM | POA: Diagnosis not present

## 2022-06-07 DIAGNOSIS — Z961 Presence of intraocular lens: Secondary | ICD-10-CM | POA: Diagnosis not present

## 2022-06-07 DIAGNOSIS — Z794 Long term (current) use of insulin: Secondary | ICD-10-CM | POA: Diagnosis not present

## 2022-06-09 LAB — HM MAMMOGRAPHY

## 2022-06-11 ENCOUNTER — Encounter: Payer: Self-pay | Admitting: Gastroenterology

## 2022-06-11 ENCOUNTER — Ambulatory Visit (AMBULATORY_SURGERY_CENTER): Payer: HMO | Admitting: Gastroenterology

## 2022-06-11 ENCOUNTER — Encounter: Payer: Self-pay | Admitting: Family Medicine

## 2022-06-11 VITALS — BP 128/60 | HR 74 | Temp 97.1°F | Resp 17 | Ht 62.0 in | Wt 203.8 lb

## 2022-06-11 DIAGNOSIS — D509 Iron deficiency anemia, unspecified: Secondary | ICD-10-CM

## 2022-06-11 DIAGNOSIS — I5032 Chronic diastolic (congestive) heart failure: Secondary | ICD-10-CM | POA: Diagnosis not present

## 2022-06-11 DIAGNOSIS — Z0389 Encounter for observation for other suspected diseases and conditions ruled out: Secondary | ICD-10-CM | POA: Diagnosis not present

## 2022-06-11 DIAGNOSIS — K3189 Other diseases of stomach and duodenum: Secondary | ICD-10-CM

## 2022-06-11 DIAGNOSIS — F319 Bipolar disorder, unspecified: Secondary | ICD-10-CM | POA: Diagnosis not present

## 2022-06-11 MED ORDER — SODIUM CHLORIDE 0.9 % IV SOLN: 500.0000 mL | Freq: Once | INTRAVENOUS | Status: DC

## 2022-06-11 NOTE — Progress Notes (Signed)
Sedate, gd SR, tolerated procedure well, VSS, report to RN 

## 2022-06-11 NOTE — Patient Instructions (Addendum)
Resume previous diet. Continue present medications. Await pathology results. Further recommendations will be based on pathology results.  YOU HAD AN ENDOSCOPIC PROCEDURE TODAY AT [facility]:   Refer to the procedure report that was given to you for any specific questions about what was found during the examination.  If the procedure report does not answer your questions, please call your gastroenterologist to clarify.  If you requested that your care partner not be given the details of your procedure findings, then the procedure report has been included in a sealed envelope for you to review at your convenience later.  YOU SHOULD EXPECT: Some feelings of bloating in the abdomen. Passage of more gas than usual.  Walking can help get rid of the air that was put into your GI tract during the procedure and reduce the bloating. If you had a lower endoscopy (such as a colonoscopy or flexible sigmoidoscopy) you may notice spotting of blood in your stool or on the toilet paper. If you underwent a bowel prep for your procedure, you may not have a normal bowel movement for a few days.  Please Note:  You might notice some irritation and congestion in your nose or some drainage.  This is from the oxygen used during your procedure.  There is no need for concern and it should clear up in a day or so.  SYMPTOMS TO REPORT IMMEDIATELY:  Following upper endoscopy (EGD)  Vomiting of blood or coffee ground material  New chest pain or pain under the shoulder blades  Painful or persistently difficult swallowing  New shortness of breath  Fever of 100F or higher  Black, tarry-looking stools  For urgent or emergent issues, a gastroenterologist can be reached at any hour by calling [phone]. Do not use MyChart messaging for urgent concerns.    DIET:  We do recommend a small meal at first, but then you may proceed to your regular diet.  Drink plenty of fluids but you should avoid alcoholic  beverages for 24 hours.  ACTIVITY:  You should plan to take it easy for the rest of today and you should NOT DRIVE or use heavy machinery until tomorrow (because of the sedation medicines used during the test).    FOLLOW UP: Our staff will call the number listed on your records the next business day following your procedure.  We will call around 7:15- 8:00 am to check on you and address any questions or concerns that you may have regarding the information given to you following your procedure. If we do not reach you, we will leave a message.     If any biopsies were taken you will be contacted by phone or by letter within the next 1-3 weeks.  Please call us at [phone] if you have not heard about the biopsies in 3 weeks.    SIGNATURES/CONFIDENTIALITY: You and/or your care partner have signed paperwork which will be entered into your electronic medical record.  These signatures attest to the fact that that the information above on your After Visit Summary has been reviewed and is understood.  Full responsibility of the confidentiality of this discharge information lies with you and/or your care-partner.

## 2022-06-11 NOTE — Progress Notes (Signed)
Called to room to assist during endoscopic procedure.  Patient ID and intended procedure confirmed with present staff. Received instructions for my participation in the procedure from the performing physician.  

## 2022-06-11 NOTE — Progress Notes (Signed)
[facility] History and Physical   Primary Care Physician:  Tawnya Crook, MD   Reason for Procedure:   Iron deficiency anemia  Plan:    EGD     HPI: Brittany Acosta is a 72 y.o. female undergoing EGD to evaluate iron deficiency anemia.  She has chronic nausea and has had weight loss (about 15 pounds).  A recent colonoscopy was notable for localized ulceration/inflammation in the splenic flexure/left colon.    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[place])    Nontoxic multinodular goiter    Osteoporosis    Personal history of radiation therapy    Pneumonia    PONV (postoperative nausea and vomiting)    PTSD (post-traumatic stress disorder)    Pure hypercholesterolemia    RBBB    Scleroderma ([place])    Sleep apnea 10/2012   mild osa-did not need cpap -dr clance   Type II diabetes mellitus ([place])    Unspecified hypothyroidism     Past Surgical History:  Procedure Laterality Date   ABDOMINAL HYSTERECTOMY  1976   BREAST EXCISIONAL BIOPSY Left    BREAST LUMPECTOMY Left 04/25/2018   BREAST LUMPECTOMY WITH RADIOACTIVE SEED AND SENTINEL LYMPH NODE BIOPSY Left 04/25/2018   Procedure: LEFT BREAST LUMPECTOMY WITH RADIOACTIVE SEED AND SENTINEL LYMPH NODE BIOPSY;  Surgeon: Excell Seltzer, MD;  Location: [place];  Service: General;  Laterality: Left;   [place]  2001    sees Dr Peter Martinique   CATARACT EXTRACTION  2014   CATARACT EXTRACTION, BILATERAL  02/2011   epps   CHOLECYSTECTOMY  1985   EXCISIONAL HEMORRHOIDECTOMY     "dr cut out in his office" (02/08/2012)   [place]  2000   right   JOINT REPLACEMENT     rt knee   KNEE ARTHROSCOPY  08/2009   right   KNEE ARTHROSCOPY WITH MEDIAL MENISECTOMY     left   Left Cystoscopy   [place]   Lithotripsy (L) Kidney  1997   LUMBAR LAMINECTOMY/DECOMPRESSION MICRODISCECTOMY Left 07/01/2016   Procedure: Laminectomy for synovial cyst - left - Lumbar four-five;  Surgeon: Earnie Larsson, MD;  Location: [place];  Service: Neurosurgery;  Laterality: Left;   PARTIAL MASTECTOMY WITH NEEDLE LOCALIZATION Left 09/01/2012   Procedure: PARTIAL MASTECTOMY WITH NEEDLE LOCALIZATION;  Surgeon: Adin Hector, MD;  Location: [place];  Service: General;  Laterality: Left;   [place] stone removed (L) Kidney  1992   REVERSE SHOULDER ARTHROPLASTY Right 02/12/2021   Procedure: REVERSE SHOULDER ARTHROPLASTY;  Surgeon: Tania Ade, MD;  Location: [facility];  Service: Orthopedics;  Laterality: Right;   Right nasal surgery  08/1988   Right sinus  removed  08/1989   tooth partial   ROTATOR CUFF REPAIR  2013   right shoulder x 2   SHOULDER ARTHROSCOPY WITH ROTATOR CUFF REPAIR AND SUBACROMIAL DECOMPRESSION Left 08/20/2013   Procedure: SHOULDER ARTHROSCOPY  AND SUBACROMIAL DECOMPRESSION;  Surgeon: Nita Sells, MD;  Location: [place];  Service: Orthopedics;  Laterality: Left;  Left shoulder arthroscopy, debridement, subacromial decompression, distal clavical resection   THYROIDECTOMY  04/22/2011   Procedure: THYROIDECTOMY;  Surgeon: Onnie Graham, MD;  Location: [place];  Service: ENT;  Laterality: N/A;  TOTAL THYROIDECOTMY   TOTAL KNEE ARTHROPLASTY  06/18/2011   Procedure: TOTAL KNEE ARTHROPLASTY;  Surgeon: Kerin Salen, MD;  Location: [place];  Service: Orthopedics;   Laterality: Left;  DEPUY   TOTAL KNEE ARTHROPLASTY  02/07/2012   Procedure: TOTAL KNEE ARTHROPLASTY;  Surgeon: Kerin Salen, MD;  Location: [place];  Service: Orthopedics;  Laterality: Right;   TUBAL LIGATION  1972    Prior to Admission medications   Medication Sig Start Date End Date Taking? Authorizing Provider  allopurinol (ZYLOPRIM) 300 MG tablet Take 1 tablet (300 mg total) by mouth daily. 12/30/21  Yes Tawnya Crook, MD  anastrozole (ARIMIDEX) 1 MG tablet Take 1 tablet (1 mg total) by mouth daily. 05/11/22  Yes Tawnya Crook, MD  aspirin EC 81 MG tablet Take 1 tablet (81 mg total) by mouth daily. Swallow whole. 01/25/20  Yes Buford Dresser, MD  Black Elderberry,Berry-Flower, 575 MG CAPS Take 575 mg by mouth daily.   Yes [provider]  calcium-vitamin D (OSCAL WITH D) 500-5 MG-MCG tablet Take 1 tablet by mouth.   Yes [provider]  Chromium-Cinnamon (CINNAMON PLUS CHROMIUM PO) Take 2 tablets by mouth daily.   Yes [provider]  cyclobenzaprine (FLEXERIL) 10 MG tablet Take 1 tablet (10 mg total) by mouth at bedtime as needed for muscle spasms. 05/11/22  Yes Tawnya Crook, MD  doxepin (SINEQUAN) 10 MG capsule Take 1 capsule (10 mg total) by mouth daily. Patient taking differently: Take 10 mg by mouth at bedtime. 12/30/21  Yes Tawnya Crook, MD  fluticasone [facility]) 50 MCG/ACT nasal spray Place 2 sprays into both nostrils daily. 01/26/22  Yes Tawnya Crook, MD  furosemide (LASIX) 40 MG tablet Take 1 tablet (40 mg total) by mouth daily. 01/26/22  Yes Tawnya Crook, MD  glucose blood (TRUE METRIX BLOOD GLUCOSE TEST) test strip TEST BLOOD SUGAR EVERY DAY AND AS NEEDED 10/28/21  Yes Tawnya Crook, MD  insulin degludec (TRESIBA FLEXTOUCH) 100 UNIT/ML FlexTouch Pen Inject 26 Units into the skin daily. 01/26/22  Yes Tawnya Crook, MD  Insulin Pen Needle (DROPLET PEN NEEDLES) 31G X 5 MM MISC USE TO INJECT INSULIN DAILY 03/19/20  Yes Inda Coke, PA  lamoTRIgine (LAMICTAL) 200 MG tablet TAKE 1 TABLET (200 MG TOTAL) BY MOUTH DAILY AT 12 NOON. 01/26/22  Yes Tawnya Crook, MD  levothyroxine (SYNTHROID) 50 MCG tablet Take 1 tablet (50 mcg total) by mouth daily. 12/30/21  Yes Tawnya Crook, MD  lisinopril (ZESTRIL) 5 MG tablet Take 1 tablet (5 mg total) by mouth daily. 01/26/22  Yes Tawnya Crook, MD  loratadine (CLARITIN) 10 MG tablet Take 10 mg by mouth daily.   Yes [provider]  metFORMIN (GLUCOPHAGE) 500 MG tablet Take 2 tablets (1,000 mg total) by mouth every morning AND 1 tablet (500 mg total) every evening. 01/04/22  Yes Tawnya Crook, MD  omeprazole (PRILOSEC) 20 MG capsule TAKE 1 CAPSULE (20 MG TOTAL) BY MOUTH DAILY. TAKE 30 MINUTES BEFORE A MEAL 01/26/22  Yes Tawnya Crook, MD  ondansetron (ZOFRAN-ODT) 4 MG disintegrating tablet '4mg'$  ODT q4 hours prn nausea/vomit 04/09/22  Yes Deno Etienne, DO  oxybutynin (DITROPAN-XL) 10 MG 24 hr tablet Take 1 tablet (10 mg total) by mouth at bedtime. 12/30/21  Yes Tawnya Crook, MD  potassium citrate (UROCIT-K) 10 MEQ (1080 MG) SR tablet Take 1 tablet (10 mEq total) by mouth daily in the afternoon. 12/30/21  Yes Tawnya Crook, MD  pravastatin (PRAVACHOL) 20 MG tablet Take 1 tablet (20 mg total) by mouth at bedtime. 12/30/21  Yes Tawnya Crook, MD  predniSONE (DELTASONE) 5 MG tablet Take 1 tablet (5 mg total) by mouth daily as directed 02/15/22  Yes   sertraline (ZOLOFT) 100 MG tablet Take 1 tablet (100 mg total) by mouth daily. 05/11/22  Yes Tawnya Crook, MD  sulfaSALAzine (AZULFIDINE) 500 MG tablet Take 2 tablets by mouth 2 (two) times daily.   Yes [provider]  tamsulosin (FLOMAX) 0.4 MG CAPS capsule Take 1 capsule (0.4 mg total) by mouth daily. 03/24/22  Yes Tawnya Crook, MD    Current Outpatient Medications  Medication Sig Dispense Refill   allopurinol (ZYLOPRIM) 300 MG tablet Take 1 tablet (300 mg total) by mouth daily. 90 tablet 2    anastrozole (ARIMIDEX) 1 MG tablet Take 1 tablet (1 mg total) by mouth daily. 90 tablet 0   aspirin EC 81 MG tablet Take 1 tablet (81 mg total) by mouth daily. Swallow whole. 90 tablet 3   Black Elderberry,Berry-Flower, 575 MG CAPS Take 575 mg by mouth daily.     calcium-vitamin D (OSCAL WITH D) 500-5 MG-MCG tablet Take 1 tablet by mouth.     Chromium-Cinnamon (CINNAMON PLUS CHROMIUM PO) Take 2 tablets by mouth daily.     cyclobenzaprine (FLEXERIL) 10 MG tablet Take 1 tablet (10 mg total) by mouth at bedtime as needed for muscle spasms. 90 tablet 0   doxepin (SINEQUAN) 10 MG capsule Take 1 capsule (10 mg total) by mouth daily. (Patient taking differently: Take 10 mg by mouth at bedtime.) 90 capsule 0   fluticasone (FLONASE) 50 MCG/ACT nasal spray Place 2 sprays into both nostrils daily. 48 g 2   furosemide (LASIX) 40 MG tablet Take 1 tablet (40 mg total) by mouth daily. 90 tablet 10   glucose blood (TRUE METRIX BLOOD GLUCOSE TEST) test strip TEST BLOOD SUGAR EVERY DAY AND AS NEEDED 200 strip 2   insulin degludec (TRESIBA FLEXTOUCH) 100 UNIT/ML FlexTouch Pen Inject 26 Units into the skin daily. 30 mL 2   Insulin Pen Needle (DROPLET PEN NEEDLES) 31G X 5 MM MISC USE TO INJECT INSULIN DAILY 100 each 3   lamoTRIgine (LAMICTAL) 200 MG tablet TAKE 1 TABLET (200 MG TOTAL) BY MOUTH DAILY AT 12 NOON. 90 tablet 10   levothyroxine (SYNTHROID) 50 MCG tablet Take 1 tablet (50 mcg total) by mouth daily. 90 tablet 2   lisinopril (ZESTRIL) 5 MG tablet Take 1 tablet (5 mg total) by mouth daily. 90 tablet 10   loratadine (CLARITIN) 10 MG tablet Take 10 mg by mouth daily.     metFORMIN (GLUCOPHAGE) 500 MG tablet Take 2 tablets (1,000 mg total) by mouth every morning AND 1 tablet (500 mg total) every evening. 270 tablet 0   omeprazole (PRILOSEC) 20 MG capsule TAKE 1 CAPSULE (20 MG TOTAL) BY MOUTH  DAILY. TAKE 30 MINUTES BEFORE A MEAL 90 capsule 10   ondansetron (ZOFRAN-ODT) 4 MG disintegrating tablet '4mg'$  ODT q4 hours  prn nausea/vomit 20 tablet 0   oxybutynin (DITROPAN-XL) 10 MG 24 hr tablet Take 1 tablet (10 mg total) by mouth at bedtime. 90 tablet 3   potassium citrate (UROCIT-K) 10 MEQ (1080 MG) SR tablet Take 1 tablet (10 mEq total) by mouth daily in the afternoon. 90 tablet 0   pravastatin (PRAVACHOL) 20 MG tablet Take 1 tablet (20 mg total) by mouth at bedtime. 90 tablet 2   predniSONE (DELTASONE) 5 MG tablet Take 1 tablet (5 mg total) by mouth daily as directed 90 tablet 9   sertraline (ZOLOFT) 100 MG tablet Take 1 tablet (100 mg total) by mouth daily. 90 tablet 0   sulfaSALAzine (AZULFIDINE) 500 MG tablet Take 2 tablets by mouth 2 (two) times daily.     tamsulosin (FLOMAX) 0.4 MG CAPS capsule Take 1 capsule (0.4 mg total) by mouth daily. 90 capsule 0   Current Facility-Administered Medications  Medication Dose Route Frequency Provider Last Rate Last Admin   0.9 %  sodium chloride infusion  500 mL Intravenous Once Daryel November, MD        Allergies as of 06/11/2022   (No Known Allergies)    Family History  Problem Relation Age of Onset   Hypertension Mother    Lung cancer Mother        non-smoker   Diabetes Father    Hypertension Father    Hyperlipidemia Father    Hypertension Sister    Hypertension Sister    Hypertension Sister    Hypertension Brother    Hypertension Brother    Hypertension Brother    Stomach cancer Maternal Grandmother    Diabetes Daughter    Heart attack Other    Coronary artery disease Other    Colon cancer Neg Hx        unsure   Esophageal cancer Neg Hx    Liver cancer Neg Hx    Pancreatic cancer Neg Hx    Rectal cancer Neg Hx    Colon polyps Neg Hx     Social History   Socioeconomic History   Marital status: Married    Spouse name: Not on file   Number of children: 2   Years of education: 14   Highest education level: Not on file  Occupational History   Occupation: disability    Employer: RETIRED  Tobacco Use   Smoking status: Former     Packs/day: 1.00    Years: 6.00    Total pack years: 6.00    Types: Cigarettes    Quit date: 04/06/1983    Years since quitting: 39.2   Smokeless tobacco: Never   Tobacco comments:    Married, lives with spouse. Disable- 2 grown kids-6 g-kids  Vaping Use   Vaping Use: Never used  Substance and Sexual Activity   Alcohol use: No    Comment: none   Drug use: No   Sexual activity: Not Currently  Other Topics Concern   Not on file  Social History Narrative   Married, lives with spouse - 2 adult children   Disabled -- started around 2000 for mental health reasons, Bipolar Disorder/PTSD   Husband works   Investment banker, operational of Radio broadcast assistant Strain: Not on file  Food Insecurity: Not on file  Transportation Needs: Not on file  Physical Activity: Not on file  Stress: Not on  file  Social Connections: Not on file  Intimate Partner Violence: Not on file    Review of Systems:  All other review of systems negative except as mentioned in the HPI.  Physical Exam: Vital signs BP 129/64   Pulse 85   Temp (!) 97.1 F (36.2 C) (Temporal)   Ht '5\' 2"'$  (1.575 m)   Wt 203 lb 12.8 oz (92.4 kg)   SpO2 96%   BMI 37.28 kg/m   General:   Alert,  Well-developed, well-nourished, pleasant and cooperative in NAD Airway:  Mallampati 3 Lungs:  Clear throughout to auscultation.   Heart:  Regular rate and rhythm; no murmurs, clicks, rubs,  or gallops. Abdomen:  Soft, nontender and nondistended. Normal bowel sounds.   Neuro/Psych:  Normal mood and affect. A and O x 3   Homer Miller E. Candis Schatz, MD [facility]

## 2022-06-11 NOTE — Progress Notes (Signed)
Pt's states no medical or surgical changes since previsit or office visit. 

## 2022-06-11 NOTE — Op Note (Signed)
[facility] Patient Name: Brittany Acosta Procedure Date: 06/11/2022 10:15 AM MRN: [id number] Endoscopist: Nicki Reaper E. Candis Schatz , MD, [id number] Age: 72 Referring MD:  Date of Birth: Apr 21, 1950 Gender: Female Account #: [id number] Procedure:                Upper GI endoscopy Indications:              Iron deficiency anemia Medicines:                Monitored Anesthesia Care Procedure:                Pre-Anesthesia Assessment:                           - Prior to the procedure, a History and Physical                            was performed, and patient medications and                            allergies were reviewed. The patient's tolerance of                            previous anesthesia was also reviewed. The risks                            and benefits of the procedure and the sedation                            options and risks were discussed with the patient.                            All questions were answered, and informed consent                            was obtained. Prior Anticoagulants: The patient has                            taken no anticoagulant or antiplatelet agents. ASA                            Grade Assessment: III - A patient with severe                            systemic disease. After reviewing the risks and                            benefits, the patient was deemed in satisfactory                            condition to undergo the procedure.                           After obtaining informed consent, the endoscope was  passed under direct vision. Throughout the                            procedure, the patient's blood pressure, pulse, and                            oxygen saturations were monitored continuously. The                            GIF [id number] was introduced through the                            mouth, and advanced to the second part of duodenum.                            The upper GI endoscopy was  accomplished without                            difficulty. The patient tolerated the procedure                            well. Scope In: Scope Out: Findings:                 The examined portions of the nasopharynx,                            oropharynx and larynx were normal.                           The examined esophagus was normal.                           The entire examined stomach was normal. Biopsies                            were taken with a cold forceps for Helicobacter                            pylori testing. Estimated blood loss was minimal.                           A single 8 mm submucosal papule (nodule) was found                            in the gastric antrum. The nodule was [place]                            classification Is (protruding, sessile). Bite on                            bite biopsies were taken with a cold forceps for                            histology. Estimated blood  loss was minimal.                           The examined duodenum was normal. Biopsies for                            histology were taken with a cold forceps for                            evaluation of celiac disease. Estimated blood loss                            was minimal. Complications:            No immediate complications. Estimated Blood Loss:     Estimated blood loss was minimal. Impression:               - The examined portions of the nasopharynx,                            oropharynx and larynx were normal.                           - Normal esophagus.                           - Normal stomach. Biopsied.                           - A single submucosal papule (nodule) found in the                            stomach. Biopsied.                           - Normal examined duodenum. Biopsied. Recommendation:           - Patient has a contact number available for                            emergencies. The signs and symptoms of potential                            delayed  complications were discussed with the                            patient. Return to normal activities tomorrow.                            Written discharge instructions were provided to the                            patient.                           - Resume previous diet.                           -  Continue present medications.                           - Await pathology results.                           - Further recommendations will be based on                            pathology results. Hiro Vipond E. Candis Schatz, MD 06/11/2022 10:45:39 AM This report has been signed electronically.

## 2022-06-14 ENCOUNTER — Telehealth: Payer: Self-pay

## 2022-06-14 NOTE — Telephone Encounter (Signed)
  Follow up Call-     06/11/2022    9:45 AM 04/16/2022    9:38 AM  Call back number  Post procedure Call Back phone  # [phone]  Permission to leave phone message Yes Yes     Patient questions:  Do you have a fever, pain , or abdominal swelling? No. Pain Score  0 *  Have you tolerated food without any problems? Yes.    Have you been able to return to your normal activities? Yes.    Do you have any questions about your discharge instructions: Diet   No. Medications  No. Follow up visit  No.  Do you have questions or concerns about your Care? No.  Actions: * If pain score is 4 or above: No action needed, pain <4.

## 2022-06-20 ENCOUNTER — Encounter (HOSPITAL_BASED_OUTPATIENT_CLINIC_OR_DEPARTMENT_OTHER): Payer: Self-pay | Admitting: Cardiology

## 2022-06-23 NOTE — Progress Notes (Signed)
Ms. Kumpf,  The biopsies taken from your stomach were notable for mild chronic gastritis (inflammation) which is a common finding, but there was no evidence of Helicobacter pylori infection. This common finding is not likely to explain iron deficiency anemia and there is no specific treatment or further evaluation recommended.  The biopsies of the gastric nodule were unremarkable and nondiagnostic.  The pathologist did not see any tissue from the layer underneath the mucosa where the nodule originates.    As discussed on the phone, an endoscopic ultrasound (EUS) is typically the next step in evaluating gastric nodules.  I will ask Dr. Rush Landmark to review your case and see if he agrees with an EUS.

## 2022-07-12 DIAGNOSIS — G629 Polyneuropathy, unspecified: Secondary | ICD-10-CM | POA: Diagnosis not present

## 2022-07-12 DIAGNOSIS — Z780 Asymptomatic menopausal state: Secondary | ICD-10-CM | POA: Diagnosis not present

## 2022-07-12 DIAGNOSIS — L4050 Arthropathic psoriasis, unspecified: Secondary | ICD-10-CM | POA: Diagnosis not present

## 2022-07-12 DIAGNOSIS — M109 Gout, unspecified: Secondary | ICD-10-CM | POA: Diagnosis not present

## 2022-07-12 DIAGNOSIS — L603 Nail dystrophy: Secondary | ICD-10-CM | POA: Diagnosis not present

## 2022-07-12 DIAGNOSIS — Z79899 Other long term (current) drug therapy: Secondary | ICD-10-CM | POA: Diagnosis not present

## 2022-07-12 DIAGNOSIS — M1990 Unspecified osteoarthritis, unspecified site: Secondary | ICD-10-CM | POA: Diagnosis not present

## 2022-07-12 DIAGNOSIS — M797 Fibromyalgia: Secondary | ICD-10-CM | POA: Diagnosis not present

## 2022-07-12 DIAGNOSIS — R768 Other specified abnormal immunological findings in serum: Secondary | ICD-10-CM | POA: Diagnosis not present

## 2022-07-15 ENCOUNTER — Telehealth: Payer: Self-pay | Admitting: Pharmacist

## 2022-07-15 NOTE — Progress Notes (Signed)
Care Management & Coordination Services Pharmacy Team  Reason for Encounter: Diabetes  Contacted patient to discuss diabetes disease state. Spoke with patient on 07/16/2022   Current antihyperglycemic regimen:  Tresiba 26 units daily Metformin 1000 mg in the morning, 500 mg in the evening   Patient verbally confirms she is taking the above medications as directed. Yes  What diet changes have been made to improve diabetes control? No recent diet changes.  What recent interventions/DTPs have been made to improve glycemic control:  No recent interventions or DTPs.  Have there been any recent hospitalizations or ED visits since last visit with PharmD? No  Patient denies hypoglycemic symptoms.  Patient denies hyperglycemic symptoms.  How often are you checking your blood sugar? twice daily  What are your blood sugars ranging?  Fasting: 98 After meals: 123  During the week, how often does your blood glucose drop below 70? Never  Are you checking your feet daily/regularly? Yes  Adherence Review: Is the patient currently on a STATIN medication? Yes Is the patient currently on ACE/ARB medication? Yes Does the patient have >5 day gap between last estimated fill dates? No   Chart Updates:  Recent office visits:  None since last Diabetes disease state call  Recent consult visits:  05/20/2022 OV (Cardiology) Jodelle Redhristopher, Bridgette, MD; no medication changes indicated.  05/19/2022 OV (Podiatry) JansenHyatt, Max T, DPM; no medication changes indicated.  Hospital visits:  None since last Diabetes disease state call  Medications: Outpatient Encounter Medications as of 07/15/2022  Medication Sig   allopurinol (ZYLOPRIM) 300 MG tablet Take 1 tablet (300 mg total) by mouth daily.   anastrozole (ARIMIDEX) 1 MG tablet Take 1 tablet (1 mg total) by mouth daily.   aspirin EC 81 MG tablet Take 1 tablet (81 mg total) by mouth daily. Swallow whole.   Black Elderberry,Berry-Flower, 575 MG CAPS  Take 575 mg by mouth daily.   calcium-vitamin D (OSCAL WITH D) 500-5 MG-MCG tablet Take 1 tablet by mouth.   Chromium-Cinnamon (CINNAMON PLUS CHROMIUM PO) Take 2 tablets by mouth daily.   cyclobenzaprine (FLEXERIL) 10 MG tablet Take 1 tablet (10 mg total) by mouth at bedtime as needed for muscle spasms.   doxepin (SINEQUAN) 10 MG capsule Take 1 capsule (10 mg total) by mouth daily. (Patient taking differently: Take 10 mg by mouth at bedtime.)   fluticasone (FLONASE) 50 MCG/ACT nasal spray Place 2 sprays into both nostrils daily.   furosemide (LASIX) 40 MG tablet Take 1 tablet (40 mg total) by mouth daily.   glucose blood (TRUE METRIX BLOOD GLUCOSE TEST) test strip TEST BLOOD SUGAR EVERY DAY AND AS NEEDED   insulin degludec (TRESIBA FLEXTOUCH) 100 UNIT/ML FlexTouch Pen Inject 26 Units into the skin daily.   Insulin Pen Needle (DROPLET PEN NEEDLES) 31G X 5 MM MISC USE TO INJECT INSULIN DAILY   lamoTRIgine (LAMICTAL) 200 MG tablet TAKE 1 TABLET (200 MG TOTAL) BY MOUTH DAILY AT 12 NOON.   levothyroxine (SYNTHROID) 50 MCG tablet Take 1 tablet (50 mcg total) by mouth daily.   lisinopril (ZESTRIL) 5 MG tablet Take 1 tablet (5 mg total) by mouth daily.   loratadine (CLARITIN) 10 MG tablet Take 10 mg by mouth daily.   metFORMIN (GLUCOPHAGE) 500 MG tablet Take 2 tablets (1,000 mg total) by mouth every morning AND 1 tablet (500 mg total) every evening.   omeprazole (PRILOSEC) 20 MG capsule TAKE 1 CAPSULE (20 MG TOTAL) BY MOUTH DAILY. TAKE 30 MINUTES BEFORE A MEAL  ondansetron (ZOFRAN-ODT) 4 MG disintegrating tablet  ODT q4 hours prn nausea/vomit   oxybutynin (DITROPAN-XL) 10 MG 24 hr tablet Take 1 tablet (10 mg total) by mouth at bedtime.   potassium citrate (UROCIT-K) 10 MEQ (1080 MG) SR tablet Take 1 tablet (10 mEq total) by mouth daily in the afternoon.   pravastatin (PRAVACHOL) 20 MG tablet Take 1 tablet (20 mg total) by mouth at bedtime.   predniSONE (DELTASONE) 5 MG tablet Take 1 tablet (5 mg  total) by mouth daily as directed   sertraline (ZOLOFT) 100 MG tablet Take 1 tablet (100 mg total) by mouth daily.   sulfaSALAzine (AZULFIDINE) 500 MG tablet Take 2 tablets by mouth 2 (two) times daily.   tamsulosin (FLOMAX) 0.4 MG CAPS capsule Take 1 capsule (0.4 mg total) by mouth daily.   No facility-administered encounter medications on file as of 07/15/2022.    Recent Relevant Labs: Lab Results  Component Value Date/Time   HGBA1C 6.7 (H) 05/07/2022 03:10 PM   HGBA1C 6.9 (H) 12/30/2021 10:57 AM   MICROALBUR 1.4 08/28/2021 10:59 AM   MICROALBUR <0.7 06/26/2015 12:13 PM    Kidney Function Lab Results  Component Value Date/Time   CREATININE 0.62 05/07/2022 03:10 PM   CREATININE 0.77 04/09/2022 06:18 PM   CREATININE 0.65 12/30/2021 10:57 AM   GFR 88.60 12/30/2021 10:57 AM   GFRNONAA >60 04/09/2022 06:18 PM   GFRAA >60 09/14/2019 02:22 PM    Star Rating Drugs:  Evaristo Bury Flextouch 100u/mL last filled 05/19/2022 115 DS Lisinopril 5 mg last filled 05/19/2022 90 DS Metformin 500 mg last filled 05/19/2022 90 DS Pravastatin 20 mg last filled 05/19/2022 90 DS  Care Gaps: Annual wellness visit in last year? No Last eye exam / retinopathy screening: 06/03/2021 Last diabetic foot exam: 09/22/2021   Future Appointments  Date Time Provider Department Center  09/27/2022  2:45 PM Erroll Luna [facility] None   April D Calhoun, [facility] Clinical Pharmacist Assistant [phone]

## 2022-07-20 ENCOUNTER — Telehealth: Payer: Self-pay | Admitting: Family Medicine

## 2022-07-20 NOTE — Telephone Encounter (Signed)
Contacted SANDEEP FORESTIER to schedule their annual wellness visit. Appointment made for 08/02/2022.  Gabriel Cirri [facility] AWV TEAM Direct Dial [phone]

## 2022-08-02 ENCOUNTER — Telehealth: Payer: Self-pay | Admitting: Gastroenterology

## 2022-08-02 NOTE — Telephone Encounter (Signed)
Inbound call from patient stating she spoke with provider about having an EGD done. Please advise.

## 2022-08-02 NOTE — Telephone Encounter (Signed)
Dr Mansouraty please review and advise  

## 2022-08-02 NOTE — Telephone Encounter (Signed)
Nonurgent EUS gastric SEL in next 3-6 months. Thanks. GM

## 2022-08-02 NOTE — Telephone Encounter (Signed)
Brittany Acosta see note below:  Brittany Acosta, The biopsies taken from your stomach were notable for mild chronic gastritis (inflammation) which is a common finding, but there was no evidence of Helicobacter pylori infection. This common finding is not likely to explain iron deficiency anemia and there is no specific treatment or further evaluation recommended.   The biopsies of the gastric nodule were unremarkable and nondiagnostic.  The pathologist did not see any tissue from the layer underneath the mucosa where the nodule originates.     As discussed on the phone, an endoscopic ultrasound (EUS) is typically the next step in evaluating gastric nodules.  I will ask Dr. Meridee Score to review your case and see if he agrees with an EUS.  Have you seen anything on this pt regarding an EUS? She is calling to inquire about the procedure.

## 2022-08-03 ENCOUNTER — Other Ambulatory Visit: Payer: Self-pay

## 2022-08-03 DIAGNOSIS — K319 Disease of stomach and duodenum, unspecified: Secondary | ICD-10-CM

## 2022-08-03 NOTE — Telephone Encounter (Signed)
EUS has been scheduled for 10/14/22 at [facility] with GM at 1015 am  Left message on machine to call back

## 2022-08-04 NOTE — Telephone Encounter (Signed)
EUS scheduled, pt instructed and medications reviewed.  Patient instructions mailed to home.  Patient to call with any questions or concerns.  

## 2022-08-10 ENCOUNTER — Ambulatory Visit (INDEPENDENT_AMBULATORY_CARE_PROVIDER_SITE_OTHER): Payer: HMO | Admitting: Family Medicine

## 2022-08-10 ENCOUNTER — Encounter: Payer: Self-pay | Admitting: Family Medicine

## 2022-08-10 VITALS — BP 130/70 | HR 73 | Temp 97.6°F | Resp 18 | Ht 61.0 in | Wt 212.5 lb

## 2022-08-10 DIAGNOSIS — D500 Iron deficiency anemia secondary to blood loss (chronic): Secondary | ICD-10-CM

## 2022-08-10 DIAGNOSIS — Z7984 Long term (current) use of oral hypoglycemic drugs: Secondary | ICD-10-CM

## 2022-08-10 DIAGNOSIS — E1140 Type 2 diabetes mellitus with diabetic neuropathy, unspecified: Secondary | ICD-10-CM

## 2022-08-10 DIAGNOSIS — E559 Vitamin D deficiency, unspecified: Secondary | ICD-10-CM

## 2022-08-10 DIAGNOSIS — R252 Cramp and spasm: Secondary | ICD-10-CM

## 2022-08-10 DIAGNOSIS — I10 Essential (primary) hypertension: Secondary | ICD-10-CM | POA: Diagnosis not present

## 2022-08-10 DIAGNOSIS — E7800 Pure hypercholesterolemia, unspecified: Secondary | ICD-10-CM | POA: Diagnosis not present

## 2022-08-10 LAB — VITAMIN D 25 HYDROXY (VIT D DEFICIENCY, FRACTURES): VITD: 61.57 ng/mL (ref 30.00–100.00)

## 2022-08-10 LAB — LIPID PANEL
Cholesterol: 133 mg/dL (ref 0–200)
HDL: 56.6 mg/dL (ref >39.00)
LDL Cholesterol: 65 mg/dL (ref 0–99)
NonHDL: 76.83
Total CHOL/HDL Ratio: 2
Triglycerides: 61 mg/dL (ref 0.0–149.0)
VLDL: 12.2 mg/dL (ref 0.0–40.0)

## 2022-08-10 LAB — CBC WITH DIFFERENTIAL/PLATELET
Basophils Absolute: 0.1 10*3/uL (ref 0.0–0.1)
Basophils Relative: 0.6 % (ref 0.0–3.0)
Eosinophils Absolute: 0.3 10*3/uL (ref 0.0–0.7)
Eosinophils Relative: 3.2 % (ref 0.0–5.0)
HCT: 31.9 % — ABNORMAL LOW (ref 36.0–46.0)
Hemoglobin: 10.4 g/dL — ABNORMAL LOW (ref 12.0–15.0)
Lymphocytes Relative: 31 % (ref 12.0–46.0)
Lymphs Abs: 3 10*3/uL (ref 0.7–4.0)
MCHC: 32.6 g/dL (ref 30.0–36.0)
MCV: 84.2 fl (ref 78.0–100.0)
Monocytes Absolute: 0.7 10*3/uL (ref 0.1–1.0)
Monocytes Relative: 7.6 % (ref 3.0–12.0)
Neutro Abs: 5.6 10*3/uL (ref 1.4–7.7)
Neutrophils Relative %: 57.6 % (ref 43.0–77.0)
Platelets: 401 10*3/uL — ABNORMAL HIGH (ref 150.0–400.0)
RBC: 3.79 Mil/uL — ABNORMAL LOW (ref 3.87–5.11)
RDW: 15.7 % — ABNORMAL HIGH (ref 11.5–15.5)
WBC: 9.6 10*3/uL (ref 4.0–10.5)

## 2022-08-10 LAB — HEMOGLOBIN A1C: Hgb A1c MFr Bld: 7.2 % — ABNORMAL HIGH (ref 4.6–6.5)

## 2022-08-10 LAB — COMPREHENSIVE METABOLIC PANEL
ALT: 14 U/L (ref 0–35)
AST: 17 U/L (ref 0–37)
Albumin: 3.6 g/dL (ref 3.5–5.2)
Alkaline Phosphatase: 94 U/L (ref 39–117)
BUN: 11 mg/dL (ref 6–23)
CO2: 34 mEq/L — ABNORMAL HIGH (ref 19–32)
Calcium: 8.7 mg/dL (ref 8.4–10.5)
Chloride: 98 mEq/L (ref 96–112)
Creatinine, Ser: 0.65 mg/dL (ref 0.40–1.20)
GFR: 88.22 mL/min (ref >60.00)
Glucose, Bld: 111 mg/dL — ABNORMAL HIGH (ref 70–99)
Potassium: 3.3 mEq/L — ABNORMAL LOW (ref 3.5–5.1)
Sodium: 140 mEq/L (ref 135–145)
Total Bilirubin: 0.2 mg/dL (ref 0.2–1.2)
Total Protein: 5.9 g/dL — ABNORMAL LOW (ref 6.0–8.3)

## 2022-08-10 LAB — MICROALBUMIN / CREATININE URINE RATIO
Creatinine,U: 50.8 mg/dL
Microalb Creat Ratio: 1.8 mg/g (ref 0.0–30.0)
Microalb, Ur: 0.9 mg/dL (ref 0.0–1.9)

## 2022-08-10 LAB — VITAMIN B12: Vitamin B-12: 297 pg/mL (ref 211–911)

## 2022-08-10 LAB — MAGNESIUM: Magnesium: 1.5 mg/dL (ref 1.5–2.5)

## 2022-08-10 LAB — TSH: TSH: 3.3 u[IU]/mL (ref 0.35–5.50)

## 2022-08-10 NOTE — Progress Notes (Signed)
Subjective:     Patient ID: Brittany Acosta, female    DOB: [birth year], 72 y.o.   MRN: [id number]  Chief Complaint  Patient presents with   Pain    Pain in right foot that started several months ago, getting worse, pain shoots up to the knee and cannot straighten leg    HPI-here w/husb Pain right foot-for several months. Will wake her up and rad up calf.  Foot will turn in and can't get it to straighten.  Will have to rub it and then stand up.  Has neuropathy.  Falls at times.  Will get intermitt swelling.  Pain occurs at least once/week(s).  Only occurring at night.  Lasting 10-58minutes.  Only once per night. Not taking K due to cost so taking K OTC (available over the counter without a prescription).   Not taking iron anymore-ran out.drinks some water.   Health Maintenance Due  Topic Date Due   Medicare Annual Wellness (AWV)  09/04/2020   OPHTHALMOLOGY EXAM  06/04/2022   Diabetic kidney evaluation - Urine ACR  08/29/2022    Past Medical History:  Diagnosis Date   Allergic rhinitis, cause unspecified    Arthritis    Bipolar disorder, unspecified (HCC)    depression   Cancer (HCC)    skin cancer on left leg, left breast cancer   Cataract    Chronic diastolic heart failure (HCC)    Depression    GERD (gastroesophageal reflux disease)    Headache    History of gout    "haven't had it in several years" (02/08/2012)   History of kidney stones    History of wrist fracture    rt wrist   Hypertension    Iron deficiency anemia    Kidney stones    sees urologist @ [facility]   Morbid obesity (HCC)    Neuropathy due to secondary diabetes (HCC)    Nontoxic multinodular goiter    Osteoporosis    Personal history of radiation therapy    Pneumonia    PONV (postoperative nausea and vomiting)    PTSD (post-traumatic stress disorder)    Pure hypercholesterolemia    RBBB    Scleroderma (HCC)    Sleep apnea 10/2012   mild osa-did not need cpap -dr clance   Type II diabetes mellitus  (HCC)    Unspecified hypothyroidism     Past Surgical History:  Procedure Laterality Date   ABDOMINAL HYSTERECTOMY  1976   BREAST EXCISIONAL BIOPSY Left    BREAST LUMPECTOMY Left 04/25/2018   BREAST LUMPECTOMY WITH RADIOACTIVE SEED AND SENTINEL LYMPH NODE BIOPSY Left 04/25/2018   Procedure: LEFT BREAST LUMPECTOMY WITH RADIOACTIVE SEED AND SENTINEL LYMPH NODE BIOPSY;  Surgeon: Glenna Fellows, MD;  Location: [facility] OR;  Service: General;  Laterality: Left;   BREAST SURGERY     CARDIAC CATHETERIZATION  2001   sees Dr Peter [place]   CATARACT EXTRACTION  2014   CATARACT EXTRACTION, BILATERAL  02/2011   epps   CHOLECYSTECTOMY  1985   EXCISIONAL HEMORRHOIDECTOMY     "dr cut out in his office" (02/08/2012)   INCISIONAL BREAST BIOPSY  2000   right   JOINT REPLACEMENT     rt knee   KNEE ARTHROSCOPY  08/2009   right   KNEE ARTHROSCOPY WITH MEDIAL MENISECTOMY     left   Left Cystoscopy   1990   LEFT OOPHORECTOMY  1980   Lithotripsy (L) Kidney  1997   LUMBAR LAMINECTOMY/DECOMPRESSION MICRODISCECTOMY Left 07/01/2016  Procedure: Laminectomy for synovial cyst - left - Lumbar four-five;  Surgeon: Julio Sicks, MD;  Location: [facility] OR;  Service: Neurosurgery;  Laterality: Left;   PARTIAL MASTECTOMY WITH NEEDLE LOCALIZATION Left 09/01/2012   Procedure: PARTIAL MASTECTOMY WITH NEEDLE LOCALIZATION;  Surgeon: Ernestene Mention, MD;  Location: [facility] OR;  Service: General;  Laterality: Left;   Percitania stone removed (L) Kidney  1992   REVERSE SHOULDER ARTHROPLASTY Right 02/12/2021   Procedure: REVERSE SHOULDER ARTHROPLASTY;  Surgeon: Jones Broom, MD;  Location: [facility] ORS;  Service: Orthopedics;  Laterality: Right;   Right nasal surgery  08/1988   Right sinus removed  08/1989   tooth partial   ROTATOR CUFF REPAIR  2013   right shoulder x 2   SHOULDER ARTHROSCOPY WITH ROTATOR CUFF REPAIR AND SUBACROMIAL DECOMPRESSION Left 08/20/2013   Procedure: SHOULDER ARTHROSCOPY  AND SUBACROMIAL DECOMPRESSION;   Surgeon: Mable Paris, MD;  Location: [facility];  Service: Orthopedics;  Laterality: Left;  Left shoulder arthroscopy, debridement, subacromial decompression, distal clavical resection   THYROIDECTOMY  04/22/2011   Procedure: THYROIDECTOMY;  Surgeon: Antony Contras, MD;  Location: [facility] OR;  Service: ENT;  Laterality: N/A;  TOTAL THYROIDECOTMY   TOTAL KNEE ARTHROPLASTY  06/18/2011   Procedure: TOTAL KNEE ARTHROPLASTY;  Surgeon: Nestor Lewandowsky, MD;  Location: [facility] OR;  Service: Orthopedics;  Laterality: Left;  DEPUY   TOTAL KNEE ARTHROPLASTY  02/07/2012   Procedure: TOTAL KNEE ARTHROPLASTY;  Surgeon: Nestor Lewandowsky, MD;  Location: [facility] OR;  Service: Orthopedics;  Laterality: Right;   TUBAL LIGATION  1972     Current Outpatient Medications:    allopurinol (ZYLOPRIM) 300 MG tablet, Take 1 tablet (300 mg total) by mouth daily., Disp: 90 tablet, Rfl: 2   anastrozole (ARIMIDEX) 1 MG tablet, Take 1 tablet (1 mg total) by mouth daily., Disp: 90 tablet, Rfl: 0   aspirin EC 81 MG tablet, Take 1 tablet (81 mg total) by mouth daily. Swallow whole., Disp: 90 tablet, Rfl: 3   Black Elderberry,Berry-Flower, 575 MG CAPS, Take 575 mg by mouth daily., Disp: , Rfl:    calcium-vitamin D (OSCAL WITH D) 500-5 MG-MCG tablet, Take 1 tablet by mouth., Disp: , Rfl:    Chromium-Cinnamon (CINNAMON PLUS CHROMIUM PO), Take 2 tablets by mouth daily., Disp: , Rfl:    cyclobenzaprine (FLEXERIL) 10 MG tablet, Take 1 tablet (10 mg total) by mouth at bedtime as needed for muscle spasms., Disp: 90 tablet, Rfl: 0   doxepin (SINEQUAN) 10 MG capsule, Take 1 capsule (10 mg total) by mouth daily. (Patient taking differently: Take 10 mg by mouth at bedtime.), Disp: 90 capsule, Rfl: 0   fluticasone (FLONASE) 50 MCG/ACT nasal spray, Place 2 sprays into both nostrils daily., Disp: 48 g, Rfl: 2   furosemide (LASIX) 40 MG tablet, Take 1 tablet (40 mg total) by mouth daily., Disp: 90 tablet, Rfl: 10   glucose blood (TRUE  METRIX BLOOD GLUCOSE TEST) test strip, TEST BLOOD SUGAR EVERY DAY AND AS NEEDED, Disp: 200 strip, Rfl: 2   insulin degludec (TRESIBA FLEXTOUCH) 100 UNIT/ML FlexTouch Pen, Inject 26 Units into the skin daily., Disp: 30 mL, Rfl: 2   Insulin Pen Needle (DROPLET PEN NEEDLES) 31G X 5 MM MISC, USE TO INJECT INSULIN DAILY, Disp: 100 each, Rfl: 3   lamoTRIgine (LAMICTAL) 200 MG tablet, TAKE 1 TABLET (200 MG TOTAL) BY MOUTH DAILY AT 12 NOON., Disp: 90 tablet, Rfl: 10   levothyroxine (SYNTHROID) 50 MCG tablet, Take 1 tablet (  50 mcg total) by mouth daily., Disp: 90 tablet, Rfl: 2   lisinopril (ZESTRIL) 5 MG tablet, Take 1 tablet (5 mg total) by mouth daily., Disp: 90 tablet, Rfl: 10   loratadine (CLARITIN) 10 MG tablet, Take 10 mg by mouth daily., Disp: , Rfl:    metFORMIN (GLUCOPHAGE) 500 MG tablet, Take 2 tablets (1,000 mg total) by mouth every morning AND 1 tablet (500 mg total) every evening., Disp: 270 tablet, Rfl: 0   omeprazole (PRILOSEC) 20 MG capsule, TAKE 1 CAPSULE (20 MG TOTAL) BY MOUTH DAILY. TAKE 30 MINUTES BEFORE A MEAL, Disp: 90 capsule, Rfl: 10   ondansetron (ZOFRAN-ODT) 4 MG disintegrating tablet, 4mg  ODT q4 hours prn nausea/vomit, Disp: 20 tablet, Rfl: 0   oxybutynin (DITROPAN-XL) 10 MG 24 hr tablet, Take 1 tablet (10 mg total) by mouth at bedtime., Disp: 90 tablet, Rfl: 3   pravastatin (PRAVACHOL) 20 MG tablet, Take 1 tablet (20 mg total) by mouth at bedtime., Disp: 90 tablet, Rfl: 2   predniSONE (DELTASONE) 5 MG tablet, Take 1 tablet (5 mg total) by mouth daily as directed, Disp: 90 tablet, Rfl: 9   sertraline (ZOLOFT) 100 MG tablet, Take 1 tablet (100 mg total) by mouth daily., Disp: 90 tablet, Rfl: 0   sulfaSALAzine (AZULFIDINE) 500 MG tablet, Take 2 tablets by mouth 2 (two) times daily., Disp: , Rfl:    tamsulosin (FLOMAX) 0.4 MG CAPS capsule, Take 1 capsule (0.4 mg total) by mouth daily., Disp: 90 capsule, Rfl: 0  No Known Allergies ROS neg/noncontributory except as noted  HPI/below  Nausea still-taking medications daily      Objective:     BP 130/70   Pulse 73   Temp 97.6 F (36.4 C) (Temporal)   Resp 18   Ht 5\' 1"  (1.549 m)   Wt 212 lb 8 oz (96.4 kg)   SpO2 96%   BMI 40.15 kg/m  Wt Readings from Last 3 Encounters:  08/10/22 212 lb 8 oz (96.4 kg)  06/11/22 203 lb 12.8 oz (92.4 kg)  05/20/22 211 lb (95.7 kg)    Physical Exam   Gen: WDWN NAD HEENT: NCAT, conjunctiva not injected, sclera nonicteric EXT:  no edema.  No calf tenderness to palpation.  DP 1+right lower extremity.   MSK: no gross abnormalities.  NEURO: A&O x3.  CN II-XII intact.  PSYCH: normal mood. Good eye contact     Assessment & Plan:  Cramps of right lower extremity -     Comprehensive metabolic panel -     TSH -     Magnesium  Essential hypertension  Hypercholesterolemia -     Lipid panel -     Comprehensive metabolic panel  Diabetic neuropathy, painful (HCC) -     Comprehensive metabolic panel -     Hemoglobin A1c -     Microalbumin / creatinine urine ratio  Vitamin D deficiency -     VITAMIN D 25 Hydroxy (Vit-D Deficiency, Fractures)  Iron deficiency anemia due to chronic blood loss -     CBC with Differential/Platelet -     Iron, TIBC and Ferritin Panel -     Vitamin B12  1  calf cramps at nightly-?low Magnesium, ?low K, ?low iron, ?from back, other.  Take Magnesium 400 mg daily and OTC (available over the counter without a prescription) K(may need Prescription and advised to let us know of expensive).  Do stretches.  Check labs.  Rest of diagnosis for labs only.  Will follow up 1  month(s).  Angelena Sole, MD

## 2022-08-10 NOTE — Patient Instructions (Signed)
Take magnesium 400 mg daily.   Continue potassium Do stretches.

## 2022-08-11 ENCOUNTER — Other Ambulatory Visit: Payer: Self-pay

## 2022-08-11 ENCOUNTER — Other Ambulatory Visit: Payer: Self-pay | Admitting: Family Medicine

## 2022-08-11 ENCOUNTER — Other Ambulatory Visit: Payer: Self-pay | Admitting: *Deleted

## 2022-08-11 ENCOUNTER — Other Ambulatory Visit: Payer: Self-pay | Admitting: Physician Assistant

## 2022-08-11 ENCOUNTER — Other Ambulatory Visit (HOSPITAL_COMMUNITY): Payer: Self-pay

## 2022-08-11 LAB — IRON,TIBC AND FERRITIN PANEL
%SAT: 9 % (calc) — ABNORMAL LOW (ref 16–45)
Ferritin: 13 ng/mL — ABNORMAL LOW (ref 16–288)
Iron: 24 ug/dL — ABNORMAL LOW (ref 45–160)
TIBC: 280 mcg/dL (calc) (ref 250–450)

## 2022-08-11 MED ORDER — DOXEPIN HCL 10 MG PO CAPS
10.0000 mg | ORAL_CAPSULE | Freq: Every day | ORAL | 1 refills | Status: DC
  Filled 2022-08-11: qty 90, 90d supply, fill #0
  Filled 2022-11-10: qty 90, 90d supply, fill #1

## 2022-08-11 MED ORDER — METFORMIN HCL 500 MG PO TABS
ORAL_TABLET | ORAL | 0 refills | Status: DC
  Filled 2022-08-11: qty 270, 90d supply, fill #0

## 2022-08-11 MED ORDER — SERTRALINE HCL 100 MG PO TABS
100.0000 mg | ORAL_TABLET | Freq: Every day | ORAL | 1 refills | Status: DC
  Filled 2022-08-11: qty 90, 90d supply, fill #0
  Filled 2022-11-10: qty 90, 90d supply, fill #1

## 2022-08-11 MED ORDER — POTASSIUM CHLORIDE CRYS ER 20 MEQ PO TBCR
20.0000 meq | EXTENDED_RELEASE_TABLET | Freq: Every day | ORAL | 0 refills | Status: DC
  Filled 2022-08-11: qty 90, 90d supply, fill #0

## 2022-08-11 MED FILL — Furosemide Tab 40 MG: ORAL | 90 days supply | Qty: 90 | Fill #1 | Status: AC

## 2022-08-11 MED FILL — Lamotrigine Tab 200 MG: ORAL | 90 days supply | Qty: 90 | Fill #1 | Status: AC

## 2022-08-11 MED FILL — Fluticasone Propionate Nasal Susp 50 MCG/ACT: NASAL | 90 days supply | Qty: 48 | Fill #1 | Status: AC

## 2022-08-11 MED FILL — Lisinopril Tab 5 MG: ORAL | 90 days supply | Qty: 90 | Fill #1 | Status: AC

## 2022-08-11 MED FILL — Omeprazole Cap Delayed Release 20 MG: ORAL | 90 days supply | Qty: 90 | Fill #1 | Status: AC

## 2022-08-11 NOTE — Progress Notes (Signed)
1.  Potassium is low-send in daily 2.  B12 is getting low-take daily 3.  Iron is low again-get back on supplements 4.  Sugars acceptable but have increased  work on diet 5.  Magnesium is low end of normal-take it as we discussed

## 2022-08-13 ENCOUNTER — Other Ambulatory Visit: Payer: Self-pay | Admitting: Family Medicine

## 2022-08-13 NOTE — Telephone Encounter (Signed)
OK to refill? Please advise 

## 2022-08-14 ENCOUNTER — Other Ambulatory Visit (HOSPITAL_COMMUNITY): Payer: Self-pay

## 2022-08-19 ENCOUNTER — Other Ambulatory Visit (HOSPITAL_COMMUNITY): Payer: Self-pay

## 2022-08-24 ENCOUNTER — Other Ambulatory Visit (HOSPITAL_COMMUNITY): Payer: Self-pay

## 2022-08-24 MED ORDER — SULFASALAZINE 500 MG PO TABS
1000.0000 mg | ORAL_TABLET | Freq: Two times a day (BID) | ORAL | 1 refills | Status: DC
  Filled 2022-08-24: qty 360, 90d supply, fill #0
  Filled 2022-11-18: qty 360, 90d supply, fill #1

## 2022-08-25 ENCOUNTER — Other Ambulatory Visit (HOSPITAL_COMMUNITY): Payer: Self-pay

## 2022-09-06 MED FILL — Insulin Degludec Soln Pen-Injector 100 Unit/ML: SUBCUTANEOUS | 115 days supply | Qty: 30 | Fill #1 | Status: AC

## 2022-09-07 ENCOUNTER — Other Ambulatory Visit: Payer: Self-pay

## 2022-09-08 ENCOUNTER — Encounter: Payer: Self-pay | Admitting: Family Medicine

## 2022-09-08 ENCOUNTER — Ambulatory Visit (INDEPENDENT_AMBULATORY_CARE_PROVIDER_SITE_OTHER): Payer: HMO | Admitting: Family Medicine

## 2022-09-08 ENCOUNTER — Other Ambulatory Visit: Payer: Self-pay

## 2022-09-08 ENCOUNTER — Other Ambulatory Visit (HOSPITAL_COMMUNITY): Payer: Self-pay

## 2022-09-08 VITALS — BP 124/58 | HR 80 | Temp 97.7°F | Resp 18 | Ht 61.0 in | Wt 210.4 lb

## 2022-09-08 DIAGNOSIS — Z7984 Long term (current) use of oral hypoglycemic drugs: Secondary | ICD-10-CM | POA: Diagnosis not present

## 2022-09-08 DIAGNOSIS — E538 Deficiency of other specified B group vitamins: Secondary | ICD-10-CM | POA: Diagnosis not present

## 2022-09-08 DIAGNOSIS — E876 Hypokalemia: Secondary | ICD-10-CM

## 2022-09-08 DIAGNOSIS — E1140 Type 2 diabetes mellitus with diabetic neuropathy, unspecified: Secondary | ICD-10-CM | POA: Diagnosis not present

## 2022-09-08 DIAGNOSIS — R252 Cramp and spasm: Secondary | ICD-10-CM

## 2022-09-08 DIAGNOSIS — I152 Hypertension secondary to endocrine disorders: Secondary | ICD-10-CM | POA: Diagnosis not present

## 2022-09-08 DIAGNOSIS — E1159 Type 2 diabetes mellitus with other circulatory complications: Secondary | ICD-10-CM

## 2022-09-08 DIAGNOSIS — D500 Iron deficiency anemia secondary to blood loss (chronic): Secondary | ICD-10-CM

## 2022-09-08 DIAGNOSIS — E559 Vitamin D deficiency, unspecified: Secondary | ICD-10-CM

## 2022-09-08 DIAGNOSIS — E7800 Pure hypercholesterolemia, unspecified: Secondary | ICD-10-CM

## 2022-09-08 DIAGNOSIS — Z79899 Other long term (current) drug therapy: Secondary | ICD-10-CM

## 2022-09-08 MED ORDER — POTASSIUM CHLORIDE CRYS ER 20 MEQ PO TBCR
20.0000 meq | EXTENDED_RELEASE_TABLET | Freq: Every day | ORAL | 3 refills | Status: DC
  Filled 2022-09-08 – 2022-11-07 (×2): qty 90, 90d supply, fill #0
  Filled 2023-02-06: qty 90, 90d supply, fill #1
  Filled 2023-05-06: qty 90, 90d supply, fill #2
  Filled 2023-08-01: qty 90, 90d supply, fill #3

## 2022-09-08 MED ORDER — CYCLOBENZAPRINE HCL 10 MG PO TABS
10.0000 mg | ORAL_TABLET | Freq: Every evening | ORAL | 3 refills | Status: DC | PRN
  Filled 2022-09-08: qty 90, 90d supply, fill #0
  Filled 2022-12-30: qty 90, 90d supply, fill #1
  Filled 2023-06-05: qty 90, 90d supply, fill #2
  Filled 2023-08-31: qty 90, 90d supply, fill #3

## 2022-09-08 MED ORDER — METFORMIN HCL 500 MG PO TABS
ORAL_TABLET | ORAL | 1 refills | Status: DC
  Filled 2022-09-08 – 2022-11-10 (×2): qty 270, 90d supply, fill #0
  Filled 2023-02-08: qty 270, 90d supply, fill #1

## 2022-09-08 MED ORDER — TAMSULOSIN HCL 0.4 MG PO CAPS
0.4000 mg | ORAL_CAPSULE | Freq: Every day | ORAL | 3 refills | Status: DC
  Filled 2022-09-08: qty 90, 90d supply, fill #0
  Filled 2022-12-30: qty 90, 90d supply, fill #1
  Filled 2023-03-29: qty 90, 90d supply, fill #2
  Filled 2023-07-02: qty 90, 90d supply, fill #3

## 2022-09-08 MED ORDER — ANASTROZOLE 1 MG PO TABS
1.0000 mg | ORAL_TABLET | Freq: Every day | ORAL | 3 refills | Status: DC
  Filled 2022-09-08: qty 90, 90d supply, fill #0
  Filled 2022-12-30: qty 90, 90d supply, fill #1
  Filled 2023-03-29: qty 90, 90d supply, fill #2
  Filled 2023-07-02: qty 90, 90d supply, fill #3

## 2022-09-08 NOTE — Progress Notes (Unsigned)
Subjective:     Patient ID: Brittany Acosta, female    DOB: 02/22/1951, 72 y.o.   MRN: [id number]  Chief Complaint  Patient presents with   Follow-up    4 week follow-up      HPI-here w/husband  DM neuropathy-Tresiba 23 units/D and metformin 1000mg  twice daily.  Sugars 90-120 HTN-Pt is on zestril 5 mg and lasix 40mg .  Bp's running  not checking.  No ha/dizziness/cp/palp/edema/cough/sob HLD-on pravachol 20mg  Vitamin D deficiency-on otc Iron deficiency anemia-getting scopes 7/11.  Taking iron-makes constipated-prunes help some, OTC not.  Nauseated all the time-before iron.  Low Magnesium-on OTC (available over the counter without a prescription). Low B12-taking OTC (available over the counter without a prescription) Low K-taking 62meq/day Gout-mostly controlled on allopurinol 300 mg daily. Right leg still getting cramps.  Health Maintenance Due  Topic Date Due   Medicare Annual Wellness (AWV)  09/04/2020   OPHTHALMOLOGY EXAM  06/04/2022    Past Medical History:  Diagnosis Date   Allergic rhinitis, cause unspecified    Arthritis    Bipolar disorder, unspecified (HCC)    depression   Cancer (HCC)    skin cancer on left leg, left breast cancer   Cataract    Chronic diastolic heart failure (HCC)    Depression    GERD (gastroesophageal reflux disease)    Headache    History of gout    "haven't had it in several years" (02/08/2012)   History of kidney stones    History of wrist fracture    rt wrist   Hypertension    Iron deficiency anemia    Kidney stones    sees urologist @ [facility]   Morbid obesity (HCC)    Neuropathy due to secondary diabetes (HCC)    Nontoxic multinodular goiter    Osteoporosis    Personal history of radiation therapy    Pneumonia    PONV (postoperative nausea and vomiting)    PTSD (post-traumatic stress disorder)    Pure hypercholesterolemia    RBBB    Scleroderma (HCC)    Sleep apnea 10/2012   mild osa-did not need cpap -dr clance   Type II  diabetes mellitus (HCC)    Unspecified hypothyroidism     Past Surgical History:  Procedure Laterality Date   ABDOMINAL HYSTERECTOMY  1976   BREAST EXCISIONAL BIOPSY Left    BREAST LUMPECTOMY Left 04/25/2018   BREAST LUMPECTOMY WITH RADIOACTIVE SEED AND SENTINEL LYMPH NODE BIOPSY Left 04/25/2018   Procedure: LEFT BREAST LUMPECTOMY WITH RADIOACTIVE SEED AND SENTINEL LYMPH NODE BIOPSY;  Surgeon: Glenna Fellows, MD;  Location: [facility];  Service: General;  Laterality: Left;   BREAST SURGERY     CARDIAC CATHETERIZATION  2001   sees Dr Peter [place]   CATARACT EXTRACTION  2014   CATARACT EXTRACTION, BILATERAL  02/2011   epps   CHOLECYSTECTOMY  1985   EXCISIONAL HEMORRHOIDECTOMY     "dr cut out in his office" (02/08/2012)   INCISIONAL BREAST BIOPSY  2000   right   JOINT REPLACEMENT     rt knee   KNEE ARTHROSCOPY  08/2009   right   KNEE ARTHROSCOPY WITH MEDIAL MENISECTOMY     left   Left Cystoscopy   1990   LEFT OOPHORECTOMY  1980   Lithotripsy (L) Kidney  1997   LUMBAR LAMINECTOMY/DECOMPRESSION MICRODISCECTOMY Left 07/01/2016   Procedure: Laminectomy for synovial cyst - left - Lumbar four-five;  Surgeon: Julio Sicks, MD;  Location: [facility] OR;  Service: Neurosurgery;  Laterality: Left;   PARTIAL MASTECTOMY WITH NEEDLE LOCALIZATION Left 09/01/2012   Procedure: PARTIAL MASTECTOMY WITH NEEDLE LOCALIZATION;  Surgeon: Ernestene Mention, MD;  [facility]: [facility];  Service: General;  Laterality: Left;   Percitania stone removed (L) Kidney  1992   REVERSE SHOULDER ARTHROPLASTY Right 02/12/2021   Procedure: REVERSE SHOULDER ARTHROPLASTY;  Surgeon: Jones Broom, MD;  Location: [facility];  Service: Orthopedics;  Laterality: Right;   Right nasal surgery  08/1988   Right sinus removed  08/1989   tooth partial   ROTATOR CUFF REPAIR  2013   right shoulder x 2   SHOULDER ARTHROSCOPY WITH ROTATOR CUFF REPAIR AND SUBACROMIAL DECOMPRESSION Left 08/20/2013   Procedure: SHOULDER ARTHROSCOPY  AND SUBACROMIAL  DECOMPRESSION;  Surgeon: Mable Paris, MD;  Location: [facility];  Service: Orthopedics;  Laterality: Left;  Left shoulder arthroscopy, debridement, subacromial decompression, distal clavical resection   THYROIDECTOMY  04/22/2011   Procedure: THYROIDECTOMY;  Surgeon: Antony Contras, MD;  Location: [facility] OR;  Service: ENT;  Laterality: N/A;  TOTAL THYROIDECOTMY   TOTAL KNEE ARTHROPLASTY  06/18/2011   Procedure: TOTAL KNEE ARTHROPLASTY;  Surgeon: Nestor Lewandowsky, MD;  Location: [facility] OR;  Service: Orthopedics;  Laterality: Left;  DEPUY   TOTAL KNEE ARTHROPLASTY  02/07/2012   Procedure: TOTAL KNEE ARTHROPLASTY;  Surgeon: Nestor Lewandowsky, MD;  Location: [facility];  Service: Orthopedics;  Laterality: Right;   TUBAL LIGATION  1972     Current Outpatient Medications:    allopurinol (ZYLOPRIM) 300 MG tablet, Take 1 tablet (300 mg total) by mouth daily., Disp: 90 tablet, Rfl: 2   aspirin EC 81 MG tablet, Take 1 tablet (81 mg total) by mouth daily. Swallow whole., Disp: 90 tablet, Rfl: 3   Black Elderberry,Berry-Flower, 575 MG CAPS, Take 575 mg by mouth daily., Disp: , Rfl:    calcium-vitamin D (OSCAL WITH D) 500-5 MG-MCG tablet, Take 1 tablet by mouth., Disp: , Rfl:    Chromium-Cinnamon (CINNAMON PLUS CHROMIUM PO), Take 2 tablets by mouth daily., Disp: , Rfl:    doxepin (SINEQUAN) 10 MG capsule, Take 1 capsule (10 mg total) by mouth daily., Disp: 90 capsule, Rfl: 1   fluticasone (FLONASE) 50 MCG/ACT nasal spray, Place 2 sprays into both nostrils daily., Disp: 48 g, Rfl: 2   furosemide (LASIX) 40 MG tablet, Take 1 tablet (40 mg total) by mouth daily., Disp: 90 tablet, Rfl: 10   glucose blood (TRUE METRIX BLOOD GLUCOSE TEST) test strip, TEST BLOOD SUGAR EVERY DAY AND AS NEEDED, Disp: 200 strip, Rfl: 2   insulin degludec (TRESIBA FLEXTOUCH) 100 UNIT/ML FlexTouch Pen, Inject 26 Units into the skin daily., Disp: 30 mL, Rfl: 2   Insulin Pen Needle (DROPLET PEN NEEDLES) 31G X 5 MM MISC, USE TO  INJECT INSULIN DAILY, Disp: 100 each, Rfl: 3   lamoTRIgine (LAMICTAL) 200 MG tablet, TAKE 1 TABLET (200 MG TOTAL) BY MOUTH DAILY AT 12 NOON., Disp: 90 tablet, Rfl: 10   levothyroxine (SYNTHROID) 50 MCG tablet, Take 1 tablet (50 mcg total) by mouth daily., Disp: 90 tablet, Rfl: 2   lisinopril (ZESTRIL) 5 MG tablet, Take 1 tablet (5 mg total) by mouth daily., Disp: 90 tablet, Rfl: 10   loratadine (CLARITIN) 10 MG tablet, Take 10 mg by mouth daily., Disp: , Rfl:    omeprazole (PRILOSEC) 20 MG capsule, TAKE 1 CAPSULE (20 MG TOTAL) BY MOUTH DAILY. TAKE 30 MINUTES BEFORE A MEAL, Disp: 90 capsule, Rfl: 10   ondansetron (ZOFRAN-ODT) 4  MG disintegrating tablet, 4mg  ODT q4 hours prn nausea/vomit, Disp: 20 tablet, Rfl: 0   oxybutynin (DITROPAN-XL) 10 MG 24 hr tablet, Take 1 tablet (10 mg total) by mouth at bedtime., Disp: 90 tablet, Rfl: 3   pravastatin (PRAVACHOL) 20 MG tablet, Take 1 tablet (20 mg total) by mouth at bedtime., Disp: 90 tablet, Rfl: 2   predniSONE (DELTASONE) 5 MG tablet, Take 1 tablet (5 mg total) by mouth daily as directed, Disp: 90 tablet, Rfl: 9   sertraline (ZOLOFT) 100 MG tablet, Take 1 tablet (100 mg total) by mouth daily., Disp: 90 tablet, Rfl: 1   sulfaSALAzine (AZULFIDINE) 500 MG tablet, Take 2 tablets by mouth 2 times daily., Disp: 360 tablet, Rfl: 1   anastrozole (ARIMIDEX) 1 MG tablet, Take 1 tablet (1 mg total) by mouth daily., Disp: 90 tablet, Rfl: 3   cyclobenzaprine (FLEXERIL) 10 MG tablet, Take 1 tablet (10 mg total) by mouth at bedtime as needed for muscle spasms., Disp: 90 tablet, Rfl: 3   metFORMIN (GLUCOPHAGE) 500 MG tablet, Take 2 tablets (1,000 mg total) by mouth every morning AND 1 tablet (500 mg total) every evening., Disp: 270 tablet, Rfl: 1   potassium chloride SA (KLOR-CON M) 20 MEQ tablet, Take 1 tablet (20 mEq total) by mouth daily., Disp: 90 tablet, Rfl: 3   tamsulosin (FLOMAX) 0.4 MG CAPS capsule, Take 1 capsule (0.4 mg total) by mouth daily., Disp: 90 capsule,  Rfl: 3  No Known Allergies ROS neg/noncontributory except as noted HPI/below Headache(s)-several times/week(s)-occasional needs OTC (available over the counter without a prescription) tylenol.        Objective:     BP (!) 124/58 (BP Location: Right Arm, Patient Position: Sitting, Cuff Size: Large)   Pulse 80   Temp 97.7 F (36.5 C) (Temporal)   Resp 18   Ht 5\' 1"  (1.549 m)   Wt 210 lb 6 oz (95.4 kg)   SpO2 98%   BMI 39.75 kg/m  Wt Readings from Last 3 Encounters:  09/08/22 210 lb 6 oz (95.4 kg)  08/10/22 212 lb 8 oz (96.4 kg)  06/11/22 203 lb 12.8 oz (92.4 kg)    Physical Exam   Gen: WDWN NAD HEENT: NCAT, conjunctiva not injected, sclera nonicteric NECK:  supple, no thyromegaly, no nodes, + R carotid bruits CARDIAC: RRR, S1S2+, no murmur. DP 2+B LUNGS: CTAB. No wheezes ABDOMEN:  BS+, soft, NTND, No HSM, no masses EXT:  no edema MSK: no gross abnormalities.  NEURO: A&O x3.  CN II-XII intact.  PSYCH: normal mood. Good eye contact     Assessment & Plan:  Hypertension associated with diabetes (HCC)  High risk medication use -     Basic metabolic panel -     Magnesium  Diabetic neuropathy, painful (HCC)  Iron deficiency anemia due to chronic blood loss -     CBC with Differential/Platelet  Other orders -     Anastrozole; Take 1 tablet (1 mg total) by mouth daily.  Dispense: 90 tablet; Refill: 3 -     Cyclobenzaprine HCl; Take 1 tablet (10 mg total) by mouth at bedtime as needed for muscle spasms.  Dispense: 90 tablet; Refill: 3 -     metFORMIN HCl; Take 2 tablets (1,000 mg total) by mouth every morning AND 1 tablet (500 mg total) every evening.  Dispense: 270 tablet; Refill: 1 -     Potassium Chloride Crys ER; Take 1 tablet (20 mEq total) by mouth daily.  Dispense: 90 tablet; Refill: 3 -  Tamsulosin HCl; Take 1 capsule (0.4 mg total) by mouth daily.  Dispense: 90 capsule; Refill: 3    Return in about 3 months (around 12/09/2022) for DM, HTN.  Angelena Sole,  MD

## 2022-09-08 NOTE — Patient Instructions (Signed)
It was very nice to see you today!    PLEASE NOTE:  If you had any lab tests please let us know if you have not heard back within a few days. You may see your results on MyChart before we have a chance to review them but we will give you a call once they are reviewed by us. If we ordered any referrals today, please let us know if you have not heard from their office within the next week.   Please try these tips to maintain a healthy lifestyle:  Eat most of your calories during the day when you are active. Eliminate processed foods including packaged sweets (pies, cakes, cookies), reduce intake of potatoes, white bread, white pasta, and white rice. Look for whole grain options, oat flour or almond flour.  Each meal should contain half fruits/vegetables, one quarter protein, and one quarter carbs (no bigger than a computer mouse).  Cut down on sweet beverages. This includes juice, soda, and sweet tea. Also watch fruit intake, though this is a healthier sweet option, it still contains natural sugar! Limit to 3 servings daily.  Drink at least 1 glass of water with each meal and aim for at least 8 glasses per day  Exercise at least 150 minutes every week.   

## 2022-09-09 DIAGNOSIS — E538 Deficiency of other specified B group vitamins: Secondary | ICD-10-CM | POA: Insufficient documentation

## 2022-09-09 LAB — MAGNESIUM: Magnesium: 1.6 mg/dL (ref 1.5–2.5)

## 2022-09-09 LAB — CBC WITH DIFFERENTIAL/PLATELET
Basophils Absolute: 0.1 10*3/uL (ref 0.0–0.1)
Basophils Relative: 0.6 % (ref 0.0–3.0)
Eosinophils Absolute: 0.3 10*3/uL (ref 0.0–0.7)
Eosinophils Relative: 1.6 % (ref 0.0–5.0)
HCT: 34.6 % — ABNORMAL LOW (ref 36.0–46.0)
Hemoglobin: 10.9 g/dL — ABNORMAL LOW (ref 12.0–15.0)
Lymphocytes Relative: 16.9 % (ref 12.0–46.0)
Lymphs Abs: 2.7 10*3/uL (ref 0.7–4.0)
MCHC: 31.4 g/dL (ref 30.0–36.0)
MCV: 83.9 fl (ref 78.0–100.0)
Monocytes Absolute: 0.7 10*3/uL (ref 0.1–1.0)
Monocytes Relative: 4.5 % (ref 3.0–12.0)
Neutro Abs: 12.1 10*3/uL — ABNORMAL HIGH (ref 1.4–7.7)
Neutrophils Relative %: 76.4 % (ref 43.0–77.0)
Platelets: 476 10*3/uL — ABNORMAL HIGH (ref 150.0–400.0)
RBC: 4.13 Mil/uL (ref 3.87–5.11)
RDW: 16.5 % — ABNORMAL HIGH (ref 11.5–15.5)
WBC: 15.8 10*3/uL — ABNORMAL HIGH (ref 4.0–10.5)

## 2022-09-09 LAB — BASIC METABOLIC PANEL
BUN: 15 mg/dL (ref 6–23)
CO2: 27 mEq/L (ref 19–32)
Calcium: 9.1 mg/dL (ref 8.4–10.5)
Chloride: 94 mEq/L — ABNORMAL LOW (ref 96–112)
Creatinine, Ser: 0.78 mg/dL (ref 0.40–1.20)
GFR: 76.06 mL/min (ref >60.00)
Glucose, Bld: 181 mg/dL — ABNORMAL HIGH (ref 70–99)
Potassium: 3.7 mEq/L (ref 3.5–5.1)
Sodium: 135 mEq/L (ref 135–145)

## 2022-09-09 NOTE — Assessment & Plan Note (Signed)
Chronic.  Has been better compliant with the 20 mEq as it is more affordable than previous.  Continue.  Check labs

## 2022-09-09 NOTE — Assessment & Plan Note (Signed)
Chronic.  Probably due to metformin and PPI.  Taking over-the-counter

## 2022-09-09 NOTE — Assessment & Plan Note (Signed)
Chronic.  Controlled for age but can be better.  Work on diet.  Continue Tresiba 26 units daily and metformin 1000 mg in the morning, 500 mg in the evening

## 2022-09-09 NOTE — Assessment & Plan Note (Signed)
Chronic.  Continue over-the-counter.  Check labs

## 2022-09-09 NOTE — Assessment & Plan Note (Signed)
Continue iron supplements.  She is getting a lot of constipation.  Offered referral to hematology for consideration of IV infusion.  She would like to hold.  She has EGD and colonoscopy scheduled next month

## 2022-09-09 NOTE — Assessment & Plan Note (Signed)
Chronic.  Controlled.  Continue lisinopril 5 mg daily

## 2022-09-09 NOTE — Assessment & Plan Note (Signed)
Chronic.  Taking over-the-counter.  Check levels

## 2022-09-09 NOTE — Assessment & Plan Note (Signed)
Chronic.  Controlled.  Continue pravastatin 20 mg daily

## 2022-09-09 NOTE — Progress Notes (Signed)
Stable.  Same medications.

## 2022-09-10 ENCOUNTER — Other Ambulatory Visit (HOSPITAL_COMMUNITY): Payer: Self-pay

## 2022-09-23 NOTE — Progress Notes (Deleted)
New Patient Office Visit  Subjective    Patient ID: Brittany Acosta, female    DOB: [birth year]  Age: 72 y.o. MRN: [id number]  CC:  Chief Complaint  Patient presents with   Error    HPI Brittany Acosta presents to establish care ***  Outpatient Encounter Medications as of 09/23/2022  Medication Sig   allopurinol (ZYLOPRIM) 300 MG tablet Take 1 tablet (300 mg total) by mouth daily.   anastrozole (ARIMIDEX) 1 MG tablet Take 1 tablet (1 mg) by mouth daily.   aspirin EC 81 MG tablet Take 1 tablet (81 mg total) by mouth daily. Swallow whole.   Black Elderberry,Berry-Flower, 575 MG CAPS Take 575 mg by mouth daily.   calcium-vitamin D (OSCAL WITH D) 500-5 MG-MCG tablet Take 1 tablet by mouth.   Chromium-Cinnamon (CINNAMON PLUS CHROMIUM PO) Take 2 tablets by mouth daily.   cyclobenzaprine (FLEXERIL) 10 MG tablet Take 1 tablet (10 mg) by mouth at bedtime as needed for muscle spasms.   doxepin (SINEQUAN) 10 MG capsule Take 1 capsule (10 mg total) by mouth daily.   fluticasone (FLONASE) 50 MCG/ACT nasal spray Place 2 sprays into both nostrils daily.   furosemide (LASIX) 40 MG tablet Take 1 tablet (40 mg total) by mouth daily.   glucose blood (TRUE METRIX BLOOD GLUCOSE TEST) test strip TEST BLOOD SUGAR EVERY DAY AND AS NEEDED   insulin degludec (TRESIBA FLEXTOUCH) 100 UNIT/ML FlexTouch Pen Inject 26 Units into the skin daily.   Insulin Pen Needle (DROPLET PEN NEEDLES) 31G X 5 MM MISC USE TO INJECT INSULIN DAILY   lamoTRIgine (LAMICTAL) 200 MG tablet TAKE 1 TABLET (200 MG TOTAL) BY MOUTH DAILY AT 12 NOON.   levothyroxine (SYNTHROID) 50 MCG tablet Take 1 tablet (50 mcg total) by mouth daily.   lisinopril (ZESTRIL) 5 MG tablet Take 1 tablet (5 mg total) by mouth daily.   loratadine (CLARITIN) 10 MG tablet Take 10 mg by mouth daily.   metFORMIN (GLUCOPHAGE) 500 MG tablet Take 2 tablets (1,000 mg total) by mouth every morning AND 1 tablet (500 mg total) every evening.   omeprazole (PRILOSEC) 20 MG  capsule TAKE 1 CAPSULE (20 MG TOTAL) BY MOUTH DAILY. TAKE 30 MINUTES BEFORE A MEAL   ondansetron (ZOFRAN-ODT) 4 MG disintegrating tablet 4mg  ODT q4 hours prn nausea/vomit   oxybutynin (DITROPAN-XL) 10 MG 24 hr tablet Take 1 tablet (10 mg total) by mouth at bedtime.   potassium chloride SA (KLOR-CON M) 20 MEQ tablet Take 1 tablet (20 mEq total) by mouth daily.   pravastatin (PRAVACHOL) 20 MG tablet Take 1 tablet (20 mg total) by mouth at bedtime.   predniSONE (DELTASONE) 5 MG tablet Take 1 tablet (5 mg total) by mouth daily as directed   sertraline (ZOLOFT) 100 MG tablet Take 1 tablet (100 mg total) by mouth daily.   sulfaSALAzine (AZULFIDINE) 500 MG tablet Take 2 tablets by mouth 2 times daily.   tamsulosin (FLOMAX) 0.4 MG CAPS capsule Take 1 capsule by mouth daily.   No facility-administered encounter medications on file as of 09/23/2022.    Past Medical History:  Diagnosis Date   Allergic rhinitis, cause unspecified    Arthritis    Bipolar disorder, unspecified (HCC)    depression   Cancer (HCC)    skin cancer on left leg, left breast cancer   Cataract    Chronic diastolic heart failure (HCC)    Depression    GERD (gastroesophageal reflux disease)    Headache  History of gout    "haven't had it in several years" (02/08/2012)   History of kidney stones    History of wrist fracture    rt wrist   Hypertension    Iron deficiency anemia    Kidney stones    sees urologist @ [facility]   Morbid obesity (HCC)    Neuropathy due to secondary diabetes (HCC)    Nontoxic multinodular goiter    Osteoporosis    Personal history of radiation therapy    Pneumonia    PONV (postoperative nausea and vomiting)    PTSD (post-traumatic stress disorder)    Pure hypercholesterolemia    RBBB    Scleroderma (HCC)    Sleep apnea 10/2012   mild osa-did not need cpap -dr clance   Type II diabetes mellitus (HCC)    Unspecified hypothyroidism     Past Surgical History:  Procedure Laterality Date    ABDOMINAL HYSTERECTOMY  1976   BREAST EXCISIONAL BIOPSY Left    BREAST LUMPECTOMY Left 04/25/2018   BREAST LUMPECTOMY WITH RADIOACTIVE SEED AND SENTINEL LYMPH NODE BIOPSY Left 04/25/2018   Procedure: LEFT BREAST LUMPECTOMY WITH RADIOACTIVE SEED AND SENTINEL LYMPH NODE BIOPSY;  Surgeon: Glenna Fellows, MD;  Location: [facility] OR;  Service: General;  Laterality: Left;   BREAST SURGERY     CARDIAC CATHETERIZATION  2001   sees Dr Peter [place]   CATARACT EXTRACTION  2014   CATARACT EXTRACTION, BILATERAL  02/2011   epps   CHOLECYSTECTOMY  1985   EXCISIONAL HEMORRHOIDECTOMY     "dr cut out in his office" (02/08/2012)   INCISIONAL BREAST BIOPSY  2000   right   JOINT REPLACEMENT     rt knee   KNEE ARTHROSCOPY  08/2009   right   KNEE ARTHROSCOPY WITH MEDIAL MENISECTOMY     left   Left Cystoscopy   1990   LEFT OOPHORECTOMY  1980   Lithotripsy (L) Kidney  1997   LUMBAR LAMINECTOMY/DECOMPRESSION MICRODISCECTOMY Left 07/01/2016   Procedure: Laminectomy for synovial cyst - left - Lumbar four-five;  Surgeon: Julio Sicks, MD;  [facility];  Service: Neurosurgery;  Laterality: Left;   PARTIAL MASTECTOMY WITH NEEDLE LOCALIZATION Left 09/01/2012   Procedure: PARTIAL MASTECTOMY WITH NEEDLE LOCALIZATION;  Surgeon: Ernestene Mention, MD;  Location: [facility];  Service: General;  Laterality: Left;   Percitania stone removed (L) Kidney  1992   REVERSE SHOULDER ARTHROPLASTY Right 02/12/2021   Procedure: REVERSE SHOULDER ARTHROPLASTY;  Surgeon: Jones Broom, MD;  Location: [facility];  Service: Orthopedics;  Laterality: Right;   Right nasal surgery  08/1988   Right sinus removed  08/1989   tooth partial   ROTATOR CUFF REPAIR  2013   right shoulder x 2   SHOULDER ARTHROSCOPY WITH ROTATOR CUFF REPAIR AND SUBACROMIAL DECOMPRESSION Left 08/20/2013   Procedure: SHOULDER ARTHROSCOPY  AND SUBACROMIAL DECOMPRESSION;  Surgeon: Mable Paris, MD;  Location: [facility];  Service: Orthopedics;   Laterality: Left;  Left shoulder arthroscopy, debridement, subacromial decompression, distal clavical resection   THYROIDECTOMY  04/22/2011   Procedure: THYROIDECTOMY;  Surgeon: Antony Contras, MD;  Location: [facility] OR;  Service: ENT;  Laterality: N/A;  TOTAL THYROIDECOTMY   TOTAL KNEE ARTHROPLASTY  06/18/2011   Procedure: TOTAL KNEE ARTHROPLASTY;  Surgeon: Nestor Lewandowsky, MD;  Location: [facility] OR;  Service: Orthopedics;  Laterality: Left;  DEPUY   TOTAL KNEE ARTHROPLASTY  02/07/2012   Procedure: TOTAL KNEE ARTHROPLASTY;  Surgeon: Nestor Lewandowsky, MD;  Location: [facility]  OR;  Service: Orthopedics;  Laterality: Right;   TUBAL LIGATION  1972    Family History  Problem Relation Age of Onset   Hypertension Mother    Lung cancer Mother        non-smoker   Diabetes Father    Hypertension Father    Hyperlipidemia Father    Hypertension Sister    Hypertension Sister    Hypertension Sister    Hypertension Brother    Hypertension Brother    Hypertension Brother    Stomach cancer Maternal Grandmother    Diabetes Daughter    Heart attack Other    Coronary artery disease Other    Colon cancer Neg Hx        unsure   Esophageal cancer Neg Hx    Liver cancer Neg Hx    Pancreatic cancer Neg Hx    Rectal cancer Neg Hx    Colon polyps Neg Hx     Social History   Socioeconomic History   Marital status: Married    Spouse name: Not on file   Number of children: 2   Years of education: 14   Highest education level: Not on file  Occupational History   Occupation: disability    Employer: RETIRED  Tobacco Use   Smoking status: Former    Packs/day: 1.00    Years: 6.00    Additional pack years: 0.00    Total pack years: 6.00    Types: Cigarettes    Quit date: 04/06/1983    Years since quitting: 39.4   Smokeless tobacco: Never   Tobacco comments:    Married, lives with spouse. Disable- 2 grown kids-6 g-kids  Vaping Use   Vaping Use: Never used  Substance and Sexual Activity   Alcohol use: No     Comment: none   Drug use: No   Sexual activity: Not Currently  Other Topics Concern   Not on file  Social History Narrative   Married, lives with spouse - 2 adult children   Disabled -- started around 2000 for mental health reasons, Bipolar Disorder/PTSD   Husband works   International aid/development worker of [employer] Strain: Not on file  Food Insecurity: Not on file  Transportation Needs: Not on file  Physical Activity: Not on file  Stress: Not on file  Social Connections: Not on file  Intimate Partner Violence: Not on file    ROS      Objective    There were no vitals taken for this visit.  Physical Exam  {Labs (Optional):23779}    Assessment & Plan:   Problem List Items Addressed This Visit   None Visit Diagnoses     Erroneous encounter - disregard    -  Primary       No follow-ups on file.   Marzella Schlein, LPN

## 2022-09-23 NOTE — Progress Notes (Signed)
Pt declined AWV stating she doesn't feel it's necessary, pt stated she wants to not receive these call This encounter was created in error - please disregard.due to pt cancellation

## 2022-09-27 ENCOUNTER — Encounter: Payer: Medicare PPO | Admitting: Pharmacist

## 2022-09-30 ENCOUNTER — Encounter (HOSPITAL_COMMUNITY): Payer: Self-pay | Admitting: Gastroenterology

## 2022-09-30 ENCOUNTER — Telehealth: Payer: Self-pay | Admitting: Family Medicine

## 2022-10-11 DIAGNOSIS — M109 Gout, unspecified: Secondary | ICD-10-CM | POA: Diagnosis not present

## 2022-10-11 DIAGNOSIS — L4050 Arthropathic psoriasis, unspecified: Secondary | ICD-10-CM | POA: Diagnosis not present

## 2022-10-11 DIAGNOSIS — G629 Polyneuropathy, unspecified: Secondary | ICD-10-CM | POA: Diagnosis not present

## 2022-10-11 DIAGNOSIS — M797 Fibromyalgia: Secondary | ICD-10-CM | POA: Diagnosis not present

## 2022-10-11 DIAGNOSIS — L603 Nail dystrophy: Secondary | ICD-10-CM | POA: Diagnosis not present

## 2022-10-11 DIAGNOSIS — M1990 Unspecified osteoarthritis, unspecified site: Secondary | ICD-10-CM | POA: Diagnosis not present

## 2022-10-11 DIAGNOSIS — Z79899 Other long term (current) drug therapy: Secondary | ICD-10-CM | POA: Diagnosis not present

## 2022-10-11 DIAGNOSIS — R768 Other specified abnormal immunological findings in serum: Secondary | ICD-10-CM | POA: Diagnosis not present

## 2022-10-13 NOTE — Anesthesia Preprocedure Evaluation (Addendum)
Anesthesia Evaluation  Patient identified by MRN, date of birth, ID band Patient awake    Reviewed: Allergy & Precautions, NPO status , Patient's Chart, lab work & pertinent test results  History of Anesthesia Complications (+) PONV and history of anesthetic complications  Airway Mallampati: II  TM Distance: >3 FB Neck ROM: Full    Dental no notable dental hx. (+) Teeth Intact, Dental Advisory Given   Pulmonary former smoker   Pulmonary exam normal breath sounds clear to auscultation       Cardiovascular hypertension, Normal cardiovascular exam Rhythm:Regular Rate:Normal  Left ventricular ejection fraction, by estimation, is 65 to 70%.   Neuro/Psych    GI/Hepatic   Endo/Other  diabetesHypothyroidism    Renal/GU      Musculoskeletal  (+) Arthritis ,  Fibromyalgia -  Abdominal  (+) + obese  Peds  Hematology  (+) Blood dyscrasia, anemia Lab Results      Component                Value               Date                      WBC                      15.8 (H)            09/08/2022                HGB                      10.9 (L)            09/08/2022                   PLT                      476.0 (H)           09/08/2022              Anesthesia Other Findings   Reproductive/Obstetrics                             Anesthesia Physical Anesthesia Plan  ASA: 3  Anesthesia Plan: MAC   Post-op Pain Management:    Induction:   PONV Risk Score and Plan:   Airway Management Planned:   Additional Equipment: None  Intra-op Plan:   Post-operative Plan:   Informed Consent: I have reviewed the patients History and Physical, chart, labs and discussed the procedure including the risks, benefits and alternatives for the proposed anesthesia with the patient or authorized representative who has indicated his/her understanding and acceptance.     Dental advisory given  Plan Discussed with:  CRNA  Anesthesia Plan Comments: (Upper endoscopic ultrasound)        Anesthesia Quick Evaluation

## 2022-10-14 ENCOUNTER — Ambulatory Visit (HOSPITAL_COMMUNITY)
Admission: RE | Admit: 2022-10-14 | Discharge: 2022-10-14 | Disposition: A | Discharge status: Home / Self Care | Payer: HMO | Attending: Gastroenterology | Admitting: Gastroenterology

## 2022-10-14 ENCOUNTER — Telehealth: Payer: Self-pay

## 2022-10-14 ENCOUNTER — Encounter (HOSPITAL_COMMUNITY)
Admission: RE | Disposition: A | Discharge status: Home / Self Care | Payer: Self-pay | Source: Home / Self Care | Attending: Gastroenterology

## 2022-10-14 ENCOUNTER — Ambulatory Visit (HOSPITAL_BASED_OUTPATIENT_CLINIC_OR_DEPARTMENT_OTHER): Payer: HMO | Admitting: Anesthesiology

## 2022-10-14 ENCOUNTER — Other Ambulatory Visit: Payer: Self-pay

## 2022-10-14 ENCOUNTER — Encounter (HOSPITAL_COMMUNITY): Payer: Self-pay | Admitting: Gastroenterology

## 2022-10-14 ENCOUNTER — Ambulatory Visit (HOSPITAL_COMMUNITY): Payer: HMO | Admitting: Anesthesiology

## 2022-10-14 DIAGNOSIS — Z853 Personal history of malignant neoplasm of breast: Secondary | ICD-10-CM | POA: Diagnosis not present

## 2022-10-14 DIAGNOSIS — K449 Diaphragmatic hernia without obstruction or gangrene: Secondary | ICD-10-CM | POA: Insufficient documentation

## 2022-10-14 DIAGNOSIS — Z6839 Body mass index (BMI) 39.0-39.9, adult: Secondary | ICD-10-CM | POA: Diagnosis not present

## 2022-10-14 DIAGNOSIS — I10 Essential (primary) hypertension: Secondary | ICD-10-CM

## 2022-10-14 DIAGNOSIS — E039 Hypothyroidism, unspecified: Secondary | ICD-10-CM | POA: Insufficient documentation

## 2022-10-14 DIAGNOSIS — I5032 Chronic diastolic (congestive) heart failure: Secondary | ICD-10-CM | POA: Insufficient documentation

## 2022-10-14 DIAGNOSIS — K222 Esophageal obstruction: Secondary | ICD-10-CM

## 2022-10-14 DIAGNOSIS — G4733 Obstructive sleep apnea (adult) (pediatric): Secondary | ICD-10-CM | POA: Insufficient documentation

## 2022-10-14 DIAGNOSIS — I899 Noninfective disorder of lymphatic vessels and lymph nodes, unspecified: Secondary | ICD-10-CM | POA: Diagnosis not present

## 2022-10-14 DIAGNOSIS — Z87891 Personal history of nicotine dependence: Secondary | ICD-10-CM | POA: Insufficient documentation

## 2022-10-14 DIAGNOSIS — F319 Bipolar disorder, unspecified: Secondary | ICD-10-CM | POA: Insufficient documentation

## 2022-10-14 DIAGNOSIS — Z923 Personal history of irradiation: Secondary | ICD-10-CM | POA: Diagnosis not present

## 2022-10-14 DIAGNOSIS — K3189 Other diseases of stomach and duodenum: Secondary | ICD-10-CM

## 2022-10-14 DIAGNOSIS — M797 Fibromyalgia: Secondary | ICD-10-CM | POA: Insufficient documentation

## 2022-10-14 DIAGNOSIS — Z85828 Personal history of other malignant neoplasm of skin: Secondary | ICD-10-CM | POA: Insufficient documentation

## 2022-10-14 DIAGNOSIS — E1140 Type 2 diabetes mellitus with diabetic neuropathy, unspecified: Secondary | ICD-10-CM | POA: Insufficient documentation

## 2022-10-14 DIAGNOSIS — E7800 Pure hypercholesterolemia, unspecified: Secondary | ICD-10-CM | POA: Insufficient documentation

## 2022-10-14 DIAGNOSIS — K219 Gastro-esophageal reflux disease without esophagitis: Secondary | ICD-10-CM | POA: Insufficient documentation

## 2022-10-14 DIAGNOSIS — I110 Hypertensive heart disease with heart failure: Secondary | ICD-10-CM | POA: Diagnosis not present

## 2022-10-14 DIAGNOSIS — D500 Iron deficiency anemia secondary to blood loss (chronic): Secondary | ICD-10-CM | POA: Diagnosis not present

## 2022-10-14 HISTORY — PX: EUS: SHX5427

## 2022-10-14 HISTORY — PX: ESOPHAGOGASTRODUODENOSCOPY (EGD) WITH PROPOFOL: SHX5813

## 2022-10-14 LAB — GLUCOSE, CAPILLARY: Glucose-Capillary: 73 mg/dL (ref 70–99)

## 2022-10-14 SURGERY — UPPER ENDOSCOPIC ULTRASOUND (EUS) RADIAL
Anesthesia: Monitor Anesthesia Care

## 2022-10-14 MED ORDER — PROPOFOL 1000 MG/100ML IV EMUL
INTRAVENOUS | Status: AC
  Filled 2022-10-14: qty 100

## 2022-10-14 MED ORDER — PROPOFOL 500 MG/50ML IV EMUL
INTRAVENOUS | Status: DC | PRN
  Administered 2022-10-14: 75 ug/kg/min via INTRAVENOUS

## 2022-10-14 MED ORDER — LACTATED RINGERS IV SOLN: INTRAVENOUS | Status: DC | PRN

## 2022-10-14 MED ORDER — PROPOFOL 10 MG/ML IV BOLUS
INTRAVENOUS | Status: DC | PRN
  Administered 2022-10-14 (×2): 20 mg via INTRAVENOUS
  Administered 2022-10-14: 40 mg via INTRAVENOUS
  Administered 2022-10-14: 20 mg via INTRAVENOUS
  Administered 2022-10-14: 60 mg via INTRAVENOUS
  Administered 2022-10-14 (×4): 20 mg via INTRAVENOUS

## 2022-10-14 MED ORDER — LIDOCAINE 2% (20 MG/ML) 5 ML SYRINGE
INTRAMUSCULAR | Status: DC | PRN
  Administered 2022-10-14: 60 mg via INTRAVENOUS

## 2022-10-14 NOTE — Anesthesia Postprocedure Evaluation (Signed)
Anesthesia Post Note  Patient: Brittany Acosta  Procedure(s) Performed: UPPER ENDOSCOPIC ULTRASOUND (EUS) RADIAL     Patient location during evaluation: Endoscopy Anesthesia Type: MAC Level of consciousness: awake and alert Pain management: pain level controlled Vital Signs Assessment: post-procedure vital signs reviewed and stable Respiratory status: spontaneous breathing, nonlabored ventilation, respiratory function stable and patient connected to nasal cannula oxygen Cardiovascular status: blood pressure returned to baseline and stable Postop Assessment: no apparent nausea or vomiting Anesthetic complications: no   No notable events documented.  Last Vitals:  Vitals:   10/14/22 1120 10/14/22 1130  BP: (!) 151/55 127/76  Pulse: 62 60  Resp: 20 14  Temp:    SpO2: 99% 99%    Last Pain:  Vitals:   10/14/22 1130  TempSrc:   PainSc: 0-No pain                 Trevor Iha

## 2022-10-14 NOTE — Transfer of Care (Signed)
Immediate Anesthesia Transfer of Care Note  Patient: Brittany Acosta  Procedure(s) Performed: UPPER ENDOSCOPIC ULTRASOUND (EUS) RADIAL  Patient Location: PACU and Endoscopy Unit  Anesthesia Type:MAC  Level of Consciousness: awake, alert , oriented, and patient cooperative  Airway & Oxygen Therapy: Patient Spontanous Breathing and Patient connected to face mask oxygen  Post-op Assessment: Report given to RN and Post -op Vital signs reviewed and stable  Post vital signs: Reviewed and stable  Last Vitals:  Vitals Value Taken Time  BP    Temp    Pulse    Resp 19 10/14/22 1110  SpO2    Vitals shown include unfiled device data.  Last Pain:  Vitals:   10/14/22 0854  TempSrc: Temporal  PainSc: 0-No pain         Complications: No notable events documented.

## 2022-10-14 NOTE — Telephone Encounter (Signed)
-----   Message from [facility] sent at 10/14/2022 11:40 AM EDT ----- Regarding: Followup Patty, This patient needs a 2-year follow-up EUS of gastric antral subepithelial lesion. Please place the recall in the system. Thanks. GM  FYI SEC

## 2022-10-14 NOTE — H&P (Signed)
GASTROENTEROLOGY PROCEDURE H&P NOTE   Primary Care Physician: Jeani Sow, MD  HPI: Brittany Acosta is a 72 y.o. female who presents for EGD/EUS to evaluate a gastric Subepithelial lesion.  Past Medical History:  Diagnosis Date   Allergic rhinitis, cause unspecified    Arthritis    Bipolar disorder, unspecified (HCC)    depression   Cancer (HCC)    skin cancer on left leg, left breast cancer   Cataract    Chronic diastolic heart failure (HCC)    Depression    GERD (gastroesophageal reflux disease)    Headache    History of gout    "haven't had it in several years" (02/08/2012)   History of kidney stones    History of wrist fracture    rt wrist   Hypertension    Iron deficiency anemia    Kidney stones    sees urologist @ [facility]   Morbid obesity (HCC)    Neuropathy due to secondary diabetes (HCC)    Nontoxic multinodular goiter    Osteoporosis    Personal history of radiation therapy    Pneumonia    PONV (postoperative nausea and vomiting)    PTSD (post-traumatic stress disorder)    Pure hypercholesterolemia    RBBB    Scleroderma (HCC)    Sleep apnea 10/2012   mild osa-did not need cpap -dr clance   Type II diabetes mellitus (HCC)    Unspecified hypothyroidism    Past Surgical History:  Procedure Laterality Date   ABDOMINAL HYSTERECTOMY  1976   BREAST EXCISIONAL BIOPSY Left    BREAST LUMPECTOMY Left 04/25/2018   BREAST LUMPECTOMY WITH RADIOACTIVE SEED AND SENTINEL LYMPH NODE BIOPSY Left 04/25/2018   Procedure: LEFT BREAST LUMPECTOMY WITH RADIOACTIVE SEED AND SENTINEL LYMPH NODE BIOPSY;  Surgeon: Glenna Fellows, MD;  Location: [facility] OR;  Service: General;  Laterality: Left;   BREAST SURGERY     CARDIAC CATHETERIZATION  2001   sees Dr Peter [place]   CATARACT EXTRACTION  2014   CATARACT EXTRACTION, BILATERAL  02/2011   epps   CHOLECYSTECTOMY  1985   EXCISIONAL HEMORRHOIDECTOMY     "dr cut out in his office" (02/08/2012)   INCISIONAL BREAST BIOPSY  2000    right   JOINT REPLACEMENT     rt knee   KNEE ARTHROSCOPY  08/2009   right   KNEE ARTHROSCOPY WITH MEDIAL MENISECTOMY     left   Left Cystoscopy   1990   LEFT OOPHORECTOMY  1980   Lithotripsy (L) Kidney  1997   LUMBAR LAMINECTOMY/DECOMPRESSION MICRODISCECTOMY Left 07/01/2016   Procedure: Laminectomy for synovial cyst - left - Lumbar four-five;  Surgeon: Julio Sicks, MD;  Location: [facility] OR;  Service: Neurosurgery;  Laterality: Left;   PARTIAL MASTECTOMY WITH NEEDLE LOCALIZATION Left 09/01/2012   Procedure: PARTIAL MASTECTOMY WITH NEEDLE LOCALIZATION;  Surgeon: Ernestene Mention, MD;  Location: [facility] OR;  Service: General;  Laterality: Left;   Percitania stone removed (L) Kidney  1992   REVERSE SHOULDER ARTHROPLASTY Right 02/12/2021   Procedure: REVERSE SHOULDER ARTHROPLASTY;  Surgeon: Jones Broom, MD;  Location: [facility] ORS;  Service: Orthopedics;  Laterality: Right;   Right nasal surgery  08/1988   Right sinus removed  08/1989   tooth partial   ROTATOR CUFF REPAIR  2013   right shoulder x 2   SHOULDER ARTHROSCOPY WITH ROTATOR CUFF REPAIR AND SUBACROMIAL DECOMPRESSION Left 08/20/2013   Procedure: SHOULDER ARTHROSCOPY  AND SUBACROMIAL DECOMPRESSION;  Surgeon: Mable Paris,  MD;  Location: [facility];  Service: Orthopedics;  Laterality: Left;  Left shoulder arthroscopy, debridement, subacromial decompression, distal clavical resection   THYROIDECTOMY  04/22/2011   Procedure: THYROIDECTOMY;  Surgeon: Antony Contras, MD;  Location[facility];  Service: ENT;  Laterality: N/A;  TOTAL THYROIDECOTMY   TOTAL KNEE ARTHROPLASTY  06/18/2011   Procedure: TOTAL KNEE ARTHROPLASTY;  Surgeon: Nestor Lewandowsky, MD;  Location: [facility] OR;  Service: Orthopedics;  Laterality: Left;  DEPUY   TOTAL KNEE ARTHROPLASTY  02/07/2012   Procedure: TOTAL KNEE ARTHROPLASTY;  Surgeon: Nestor Lewandowsky, MD;  Location: [facility] OR;  Service: Orthopedics;  Laterality: Right;   TUBAL LIGATION  1972   No current  facility-administered medications for this encounter.   No current facility-administered medications for this encounter. No Known Allergies Family History  Problem Relation Age of Onset   Hypertension Mother    Lung cancer Mother        non-smoker   Diabetes Father    Hypertension Father    Hyperlipidemia Father    Hypertension Sister    Hypertension Sister    Hypertension Sister    Hypertension Brother    Hypertension Brother    Hypertension Brother    Stomach cancer Maternal Grandmother    Diabetes Daughter    Heart attack Other    Coronary artery disease Other    Colon cancer Neg Hx        unsure   Esophageal cancer Neg Hx    Liver cancer Neg Hx    Pancreatic cancer Neg Hx    Rectal cancer Neg Hx    Colon polyps Neg Hx    Social History   Socioeconomic History   Marital status: Married    Spouse name: Not on file   Number of children: 2   Years of education: 14   Highest education level: Not on file  Occupational History   Occupation: disability    Employer: RETIRED  Tobacco Use   Smoking status: Former    Current packs/day: 0.00    Average packs/day: 1 pack/day for 6.0 years (6.0 ttl pk-yrs)    Types: Cigarettes    Start date: 04/05/1977    Quit date: 04/06/1983    Years since quitting: 39.5   Smokeless tobacco: Never   Tobacco comments:    Married, lives with spouse. Disable- 2 grown kids-6 g-kids  Vaping Use   Vaping status: Never Used  Substance and Sexual Activity   Alcohol use: No    Comment: none   Drug use: No   Sexual activity: Not Currently  Other Topics Concern   Not on file  Social History Narrative   Married, lives with spouse - 2 adult children   Disabled -- started around 2000 for mental health reasons, Bipolar Disorder/PTSD   Husband works   International aid/development worker of [employer] investment [employer] Strain: Low Risk  (09/05/2019)   Received from [facility]   Overall Financial Resource Strain (CARDIA)    Difficulty of Paying Living  Expenses: Not very hard  Food Insecurity: No Food Insecurity (09/05/2019)   Received from [facility]   Hunger Vital Sign    Worried About Running Out of Food in the Last Year: Never true    Ran Out of Food in the Last Year: Never true  Transportation Needs: No Transportation Needs (09/05/2019)   Received from [facility] - Transportation    Lack of Transportation (Medical): No    Lack  of Transportation (Non-Medical): No  Physical Activity: Unknown (09/05/2019)   Received from [facility]   Exercise Vital Sign    Days of Exercise per Week: 0 days    Minutes of Exercise per Session: Not on file  Stress: No Stress Concern Present (09/05/2019)   Received from [facility] - Occupational Stress Questionnaire    Feeling of Stress : Only a little  Social Connections: Unknown (08/16/2021)   Received from [facility]   Social Network    Social Network: Not on file  Intimate Partner Violence: Unknown (07/08/2021)   Received from [facility]   HITS    Physically Hurt: Not on file    Insult or Talk Down To: Not on file    Threaten Physical Harm: Not on file    Scream or Curse: Not on file    Physical Exam: Today's Vitals   10/14/22 0854  BP: (!) 139/58  Pulse: 70  Resp: 12  Temp: (!) 97.4 F (36.3 C)  TempSrc: Temporal  SpO2: 98%  Weight: 95.7 kg  Height: 5\' 1"  (1.549 m)  PainSc: 0-No pain   Body mass index is 39.87 kg/m. GEN: NAD EYE: Sclerae anicteric ENT: MMM CV: Non-tachycardic GI: Soft, NT/ND NEURO:  Alert & Oriented x 3  Lab Results: No results for input(s): "WBC", "HGB", "HCT", "PLT" in the last 72 hours. BMET No results for input(s): "NA", "K", "CL", "CO2", "GLUCOSE", "BUN", "CREATININE", "CALCIUM" in the last 72 hours. LFT No results for input(s): "PROT", "ALBUMIN", "AST", "ALT", "ALKPHOS", "BILITOT", "BILIDIR", "IBILI" in the last 72 hours. PT/INR No results for input(s): "LABPROT", "INR" in the last 72  hours.   Impression / Plan: This is a 72 y.o.female who presents for EGD/EUS to evaluate a gastric Subepithelial lesion.  The risks of an EUS including intestinal perforation, bleeding, infection, aspiration, and medication effects were discussed as was the possibility it may not give a definitive diagnosis if a biopsy is performed.  When a biopsy of the pancreas is done as part of the EUS, there is an additional risk of pancreatitis at the rate of about 1-2%.  It was explained that procedure related pancreatitis is typically mild, although it can be severe and even life threatening, which is why we do not perform random pancreatic biopsies and only biopsy a lesion/area we feel is concerning enough to warrant the risk.  The risks and benefits of endoscopic evaluation/treatment were discussed with the patient and/or family; these include but are not limited to the risk of perforation, infection, bleeding, missed lesions, lack of diagnosis, severe illness requiring hospitalization, as well as anesthesia and sedation related illnesses.  The patient's history has been reviewed, patient examined, no change in status, and deemed stable for procedure.  The patient and/or family is agreeable to proceed.    Corliss Parish, MD [facility] Office # [phone]

## 2022-10-14 NOTE — Op Note (Addendum)
[facility] Patient Name: Brittany Acosta Procedure Date: 10/14/2022 MRN: [id number] Attending MD: Corliss Parish , MD, [id number] Date of Birth: January 21, 1951 CSN: [id number] Age: 72 Admit Type: Outpatient Procedure:                Upper EUS Indications:              Gastric deformity on endoscopy/Subepithelial tumor                            versus extrinsic compression Providers:                Corliss Parish, MD, Margaree Mackintosh, RN,                            Harrington Challenger, Technician Referring MD:              Medicines:                Monitored Anesthesia Care Complications:            No immediate complications. Estimated Blood Loss:     Estimated blood loss was minimal. Procedure:                Pre-Anesthesia Assessment:                           - Prior to the procedure, a History and Physical                            was performed, and patient medications and                            allergies were reviewed. The patient's tolerance of                            previous anesthesia was also reviewed. The risks                            and benefits of the procedure and the sedation                            options and risks were discussed with the patient.                            All questions were answered, and informed consent                            was obtained. Prior Anticoagulants: The patient has                            taken no anticoagulant or antiplatelet agents. ASA                            Grade Assessment: III - A patient with severe                            systemic  disease. After reviewing the risks and                            benefits, the patient was deemed in satisfactory                            condition to undergo the procedure.                           After obtaining informed consent, the endoscope was                            passed under direct vision. Throughout the                             procedure, the patient's blood pressure, pulse, and                            oxygen saturations were monitored continuously. The                            [id number] ([id number]) Olympus therapeutic endoscope                            was introduced through the mouth, and advanced to                            the second part of duodenum. The [id number]                            ([id number]) Olympus radial ultrasound scope was                            introduced through the mouth, and advanced to the                            stomach for ultrasound examination. The upper EUS                            was accomplished without difficulty. The patient                            tolerated the procedure. Scope In: Scope Out: Findings:      ENDOSCOPIC FINDING: :      No gross lesions were noted in the entire esophagus.      A non-obstructing Schatzki ring was found at the gastroesophageal       junction.      The Z-line was regular and was found 35 cm from the incisors.      A 2 cm hiatal hernia was present.      A single 10 mm subepithelial papule (nodule) with no bleeding and no       stigmata of recent bleeding was found on the greater curvature of the       gastric antrum.      Patchy mildly erythematous  mucosa without bleeding was found in the       entire examined stomach.      No gross lesions were noted in the duodenal bulb, in the first portion       of the duodenum and in the second portion of the duodenum.      ENDOSONOGRAPHIC FINDING: :      A round intramural (subepithelial) lesion was found in the antrum of the       stomach. The lesion was hypoechoic. Sonographically, the lesion appeared       to originate from the deep mucosa (Layer 2). The lesion measured 9 mm       (in maximum thickness). The lesion also measured 6 mm in diameter. The       outer endosonographic borders were well defined.      Endosonographic images of the stomach were unremarkable. No masses and        no wall thickening were identified.      No malignant-appearing lymph nodes were visualized in the left gastric       region (level 17) and gastrohepatic ligament (level 18).      The celiac region was visualized. Impression:               EGD Impression:                           - No gross lesions in the entire esophagus.                            Non-obstructing Schatzki ring. Z-line regular, 35                            cm from the incisors.                           - 2 cm hiatal hernia.                           - A single subepithelial papule (nodule) found in                            the stomach.                           - Erythematous mucosa in the stomach - previously                            biopsied.                           - No gross lesions in the duodenal bulb, in the                            first portion of the duodenum and in the second                            portion of the duodenum.  EUS Impression:                           - An intramural (subepithelial) lesion was found in                            the antrum of the stomach. The lesion appeared to                            originate from within the deep mucosa (Layer 2).                           - Endosonographic images of the stomach were                            unremarkable.                           - No malignant-appearing lymph nodes were                            visualized in the left gastric region (level 17)                            and gastrohepatic ligament (level 18). Moderate Sedation:      Not Applicable - Patient had care per Anesthesia. Recommendation:           - The patient will be observed post-procedure,                            until all discharge criteria are met.                           - Discharge patient to home.                           - Patient has a contact number available for                            emergencies. The signs and  symptoms of potential                            delayed complications were discussed with the                            patient. Return to normal activities tomorrow.                            Written discharge instructions were provided to the                            patient.                           - Resume previous diet.                           -  Observe patient's clinical course.                           - Repeat the upper endoscopic ultrasound in 2 years                            for surveillance of the lesion.                           - The findings and recommendations were discussed                            with the patient.                           - The findings and recommendations were discussed                            with the patient's family. Procedure Code(s):        --- Professional ---                           [phone], Esophagogastroduodenoscopy, flexible,                            transoral; with endoscopic ultrasound examination                            limited to the esophagus, stomach or duodenum, and                            adjacent structures Diagnosis Code(s):        --- Professional ---                           K22.2, Esophageal obstruction                           K44.9, Diaphragmatic hernia without obstruction or                            gangrene                           K31.89, Other diseases of stomach and duodenum                           I89.9, Noninfective disorder of lymphatic vessels                            and lymph nodes, unspecified CPT copyright 2022 American Medical Association. All rights reserved. The codes documented in this report are preliminary and upon coder review may  be revised to meet current compliance requirements. Corliss Parish, MD 10/14/2022 11:34:17 AM Number of Addenda: 0

## 2022-10-14 NOTE — Telephone Encounter (Signed)
Recall has been placed.

## 2022-10-14 NOTE — Discharge Instructions (Signed)
YOU HAD AN ENDOSCOPIC PROCEDURE TODAY: Refer to the procedure report and other information in the discharge instructions given to you for any specific questions about what was found during the examination. If this information does not answer your questions, please call [place] office at [phone] to clarify.  ° °YOU SHOULD EXPECT: Some feelings of bloating in the abdomen. Passage of more gas than usual. Walking can help get rid of the air that was put into your GI tract during the procedure and reduce the bloating. If you had a lower endoscopy (such as a colonoscopy or flexible sigmoidoscopy) you may notice spotting of blood in your stool or on the toilet paper. Some abdominal soreness may be present for a day or two, also. ° °DIET: Your first meal following the procedure should be a light meal and then it is ok to progress to your normal diet. A half-sandwich or bowl of soup is an example of a good first meal. Heavy or fried foods are harder to digest and may make you feel nauseous or bloated. Drink plenty of fluids but you should avoid alcoholic beverages for 24 hours. If you had a esophageal dilation, please see attached instructions for diet.   ° °ACTIVITY: Your care partner should take you home directly after the procedure. You should plan to take it easy, moving slowly for the rest of the day. You can resume normal activity the day after the procedure however YOU SHOULD NOT DRIVE, use power tools, machinery or perform tasks that involve climbing or major physical exertion for 24 hours (because of the sedation medicines used during the test).  ° °SYMPTOMS TO REPORT IMMEDIATELY: °A gastroenterologist can be reached at any hour. Please call [phone]  for any of the following symptoms:  °Following lower endoscopy (colonoscopy, flexible sigmoidoscopy) °Excessive amounts of blood in the stool  °Significant tenderness, worsening of abdominal pains  °Swelling of the abdomen that is new, acute  °Fever of 100° or  higher  °Following upper endoscopy (EGD, EUS, ERCP, esophageal dilation) °Vomiting of blood or coffee ground material  °New, significant abdominal pain  °New, significant chest pain or pain under the shoulder blades  °Painful or persistently difficult swallowing  °New shortness of breath  °Black, tarry-looking or red, bloody stools ° °FOLLOW UP:  °If any biopsies were taken you will be contacted by phone or by letter within the next 1-3 weeks. Call [phone]  if you have not heard about the biopsies in 3 weeks.  °Please also call with any specific questions about appointments or follow up tests. ° °

## 2022-10-14 NOTE — Anesthesia Procedure Notes (Signed)
Procedure Name: MAC Date/Time: 10/14/2022 10:14 AM  Performed by: Maurene Capes, CRNAPre-anesthesia Checklist: Patient identified, Emergency Drugs available, Suction available and Patient being monitored Patient Re-evaluated:Patient Re-evaluated prior to induction Oxygen Delivery Method: Simple face mask Induction Type: IV induction Ventilation: Mask ventilation without difficulty Airway Equipment and Method: Patient positioned with wedge pillow Placement Confirmation: positive ETCO2 Dental Injury: Teeth and Oropharynx as per pre-operative assessment

## 2022-10-15 ENCOUNTER — Encounter (HOSPITAL_COMMUNITY): Payer: Self-pay | Admitting: Gastroenterology

## 2022-10-15 ENCOUNTER — Telehealth: Payer: Self-pay | Admitting: Gastroenterology

## 2022-10-15 NOTE — Telephone Encounter (Signed)
Thanks for this update JF.   I'll forward to the [facility] team. GM

## 2022-10-15 NOTE — Telephone Encounter (Signed)
Patient called states she was calling to advise that she had a pest control company go to her home and they did not find any bed bugs.

## 2022-10-15 NOTE — Telephone Encounter (Signed)
FYI not sure if this was a concern from  hospital appointment yesterday.

## 2022-10-28 ENCOUNTER — Ambulatory Visit: Payer: HMO | Admitting: Podiatry

## 2022-11-02 ENCOUNTER — Ambulatory Visit: Payer: HMO | Admitting: Podiatry

## 2022-11-05 ENCOUNTER — Other Ambulatory Visit (HOSPITAL_COMMUNITY): Payer: Self-pay

## 2022-11-05 DIAGNOSIS — M4696 Unspecified inflammatory spondylopathy, lumbar region: Secondary | ICD-10-CM | POA: Diagnosis not present

## 2022-11-05 DIAGNOSIS — M5416 Radiculopathy, lumbar region: Secondary | ICD-10-CM | POA: Diagnosis not present

## 2022-11-05 MED ORDER — METHOCARBAMOL 500 MG PO TABS
ORAL_TABLET | ORAL | 2 refills | Status: DC
  Filled 2022-11-05: qty 60, 5d supply, fill #0
  Filled 2022-11-07 – 2022-11-08 (×2): qty 60, 5d supply, fill #1

## 2022-11-06 ENCOUNTER — Other Ambulatory Visit (HOSPITAL_COMMUNITY): Payer: Self-pay

## 2022-11-08 ENCOUNTER — Other Ambulatory Visit (HOSPITAL_COMMUNITY): Payer: Self-pay

## 2022-11-08 ENCOUNTER — Other Ambulatory Visit: Payer: Self-pay

## 2022-11-08 IMAGING — CT CT HEAD W/O CM
1 series · 15 of 30 positions shown, 19 images · non-contrast
Comparison: Brain MRI 07/19/2012. CT of the temporal bones
04/17/2018.

CLINICAL DATA: Other headache syndrome FPP.WV (EVA-OG-CM).
Additional history provided by scanning technologist: Patient
reports left-sided headaches, visual changes, seeing "floaters and
"blurriness. History of sinus surgery. History of fall 3-4 months
ago with left orbital fracture.

EXAM:
CT HEAD WITHOUT CONTRAST
TECHNIQUE: Contiguous axial images were obtained from the base of the skull
through the vertex without intravenous contrast.

[Series 2: head w/(date) · axial · 0.45mm/px · z∈[+302,+452]mm · 15 of 34 slices shown, 19 images]
[im 2/34  brain]
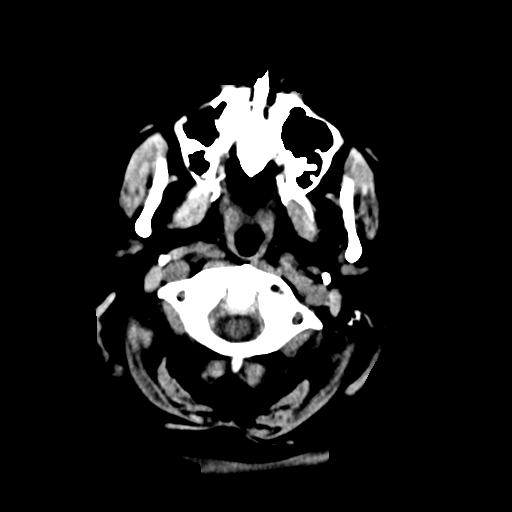
[im 2/34  bone]
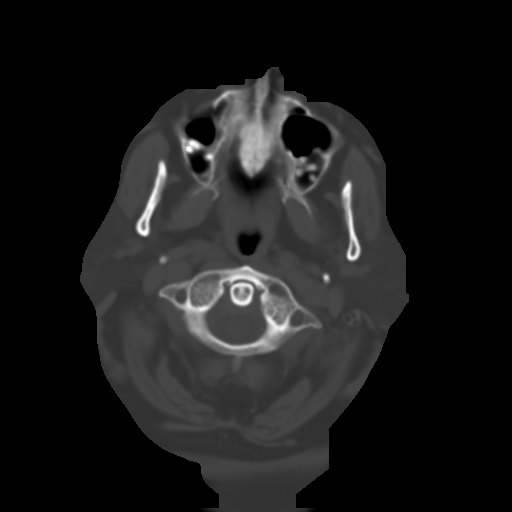
[im 4/34  brain]
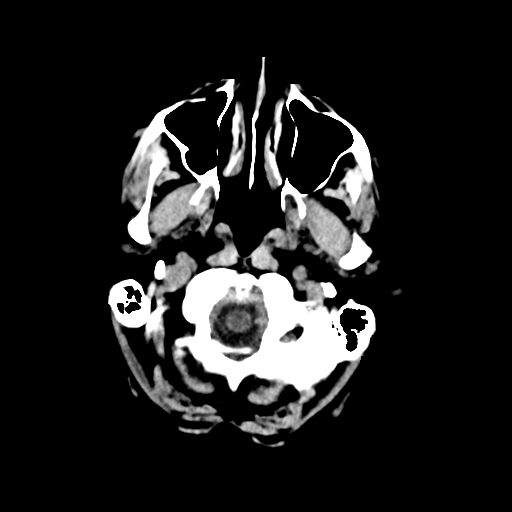
[im 6/34  brain]
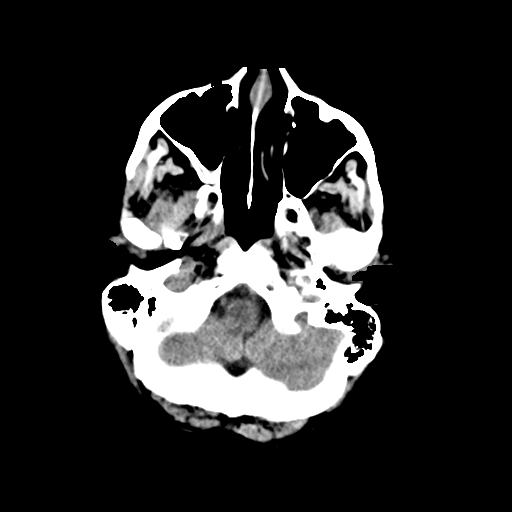
[im 8/34  brain]
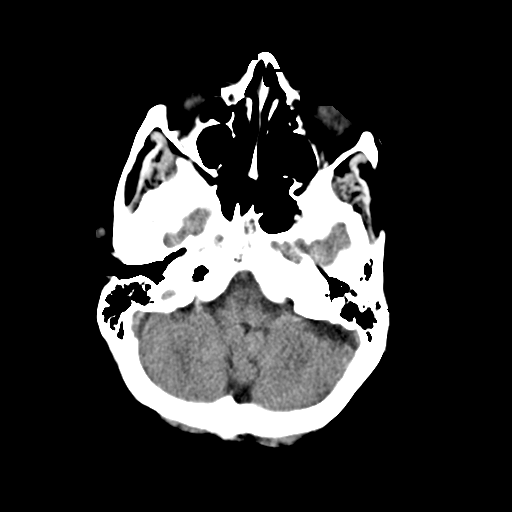
[im 11/34  brain]
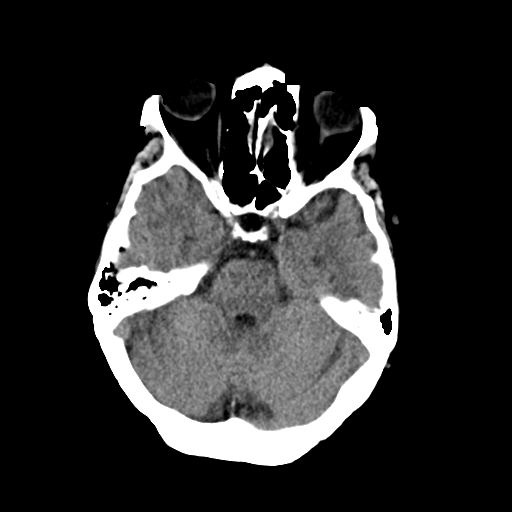
[im 11/34  bone]
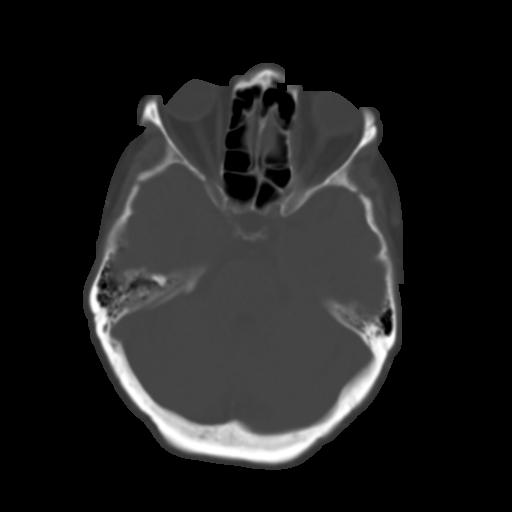
[im 13/34  brain]
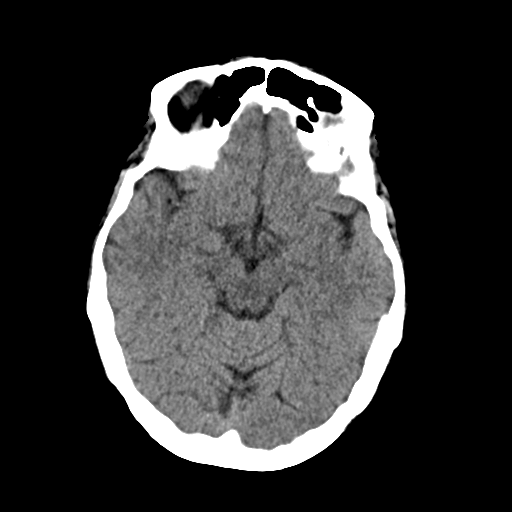
[im 15/34  brain]
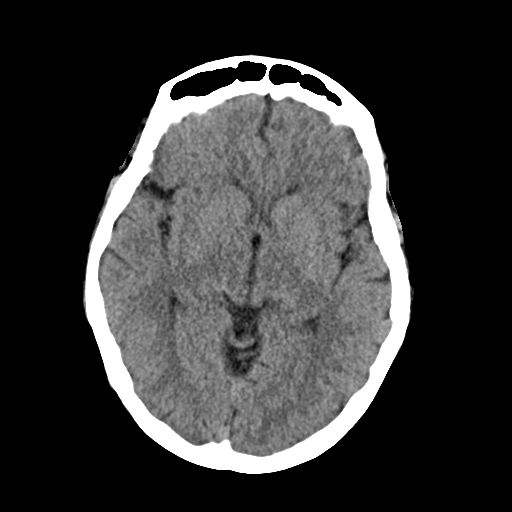
[im 18/34  brain]
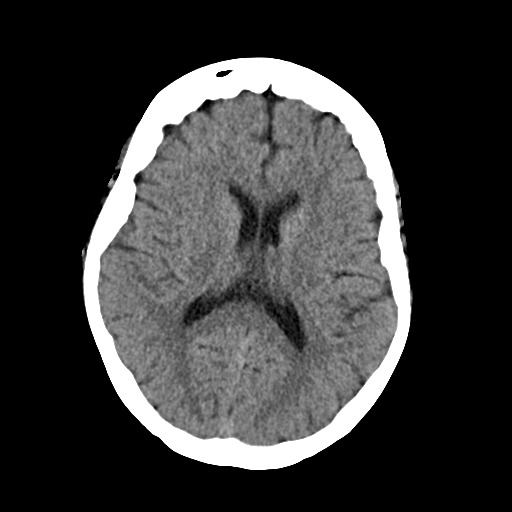
[im 19/34  brain]
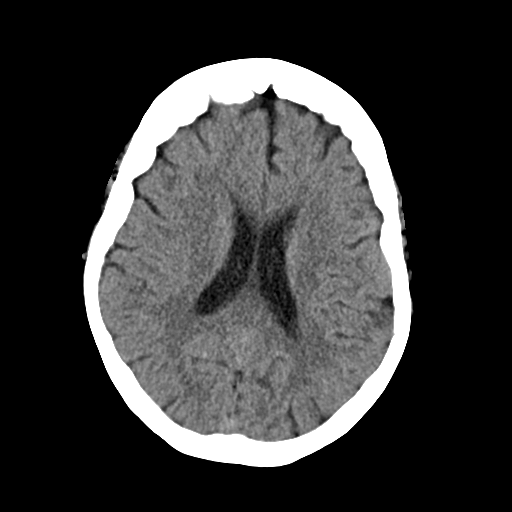
[im 19/34  bone]
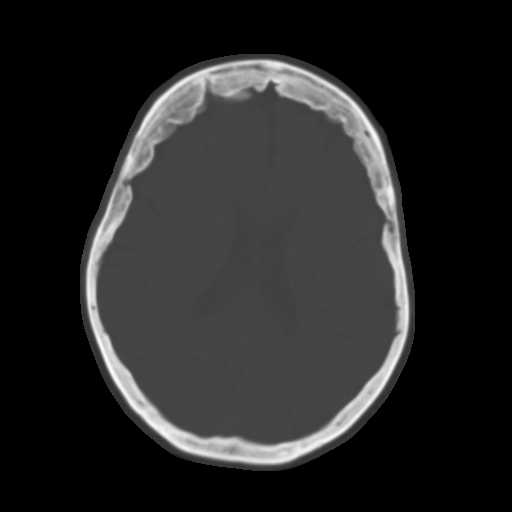
[im 21/34  brain]
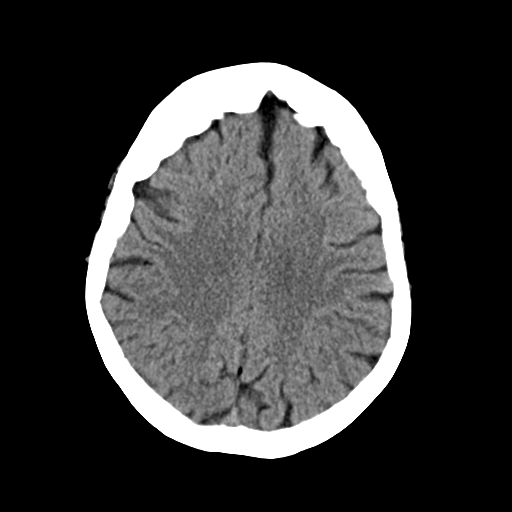
[im 23/34  brain]
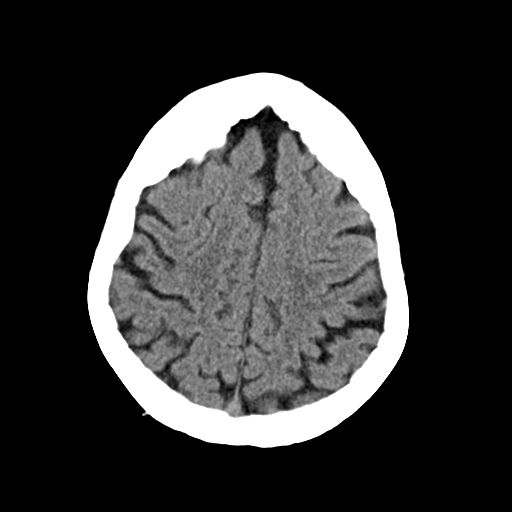
[im 26/34  brain]
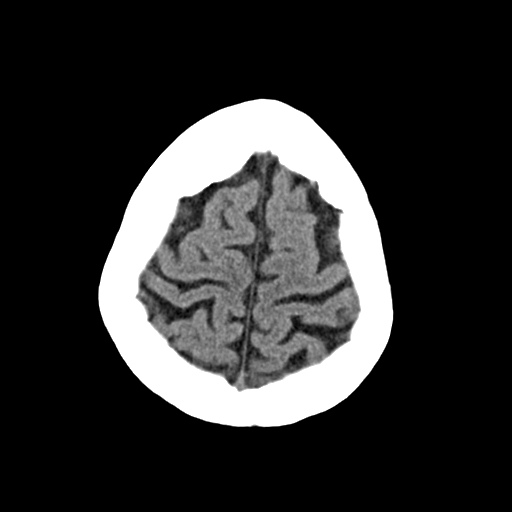
[im 28/34  brain]
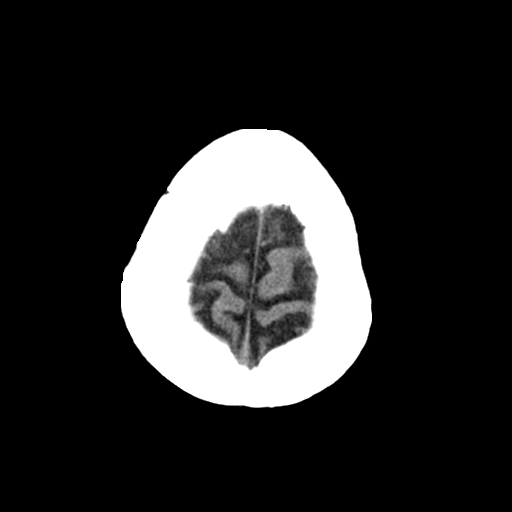
[im 28/34  bone]
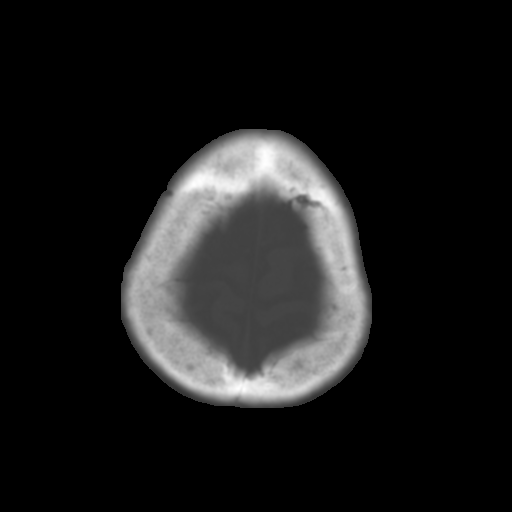
[im 30/34  brain]
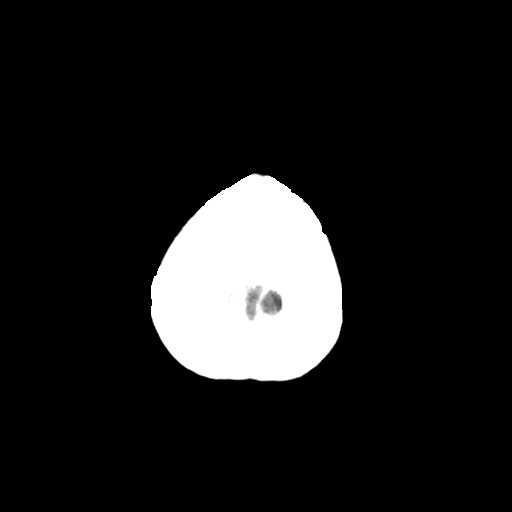
[im 32/34  brain]
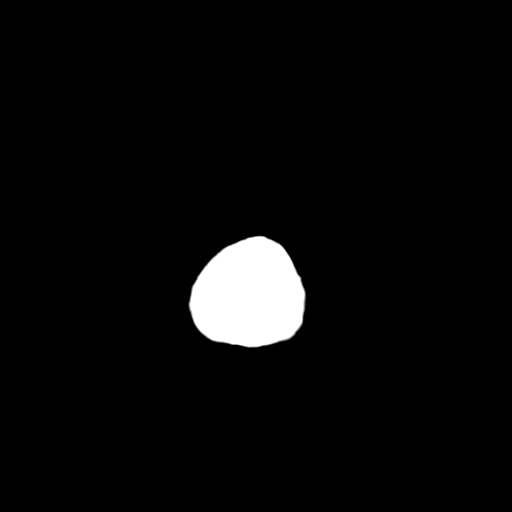

[15 of 30 positions shown; findings below may reference images not displayed]

FINDINGS: Brain:

Cerebral volume is normal.

There is no acute intracranial hemorrhage.

No demarcated cortical infarct.

No extra-axial fluid collection.

No evidence of an intracranial mass.

No midline shift.

Partially empty sella turcica.

Vascular: No hyperdense vessel.  Atherosclerotic calcifications

Skull: Normal. Negative for fracture or focal lesion.

Sinuses/Orbits: Visualized orbits show no acute finding. Chronic
medially displaced fracture deformity of the left lamina papyracea.
Slightly distorted appearance of the left medial rectus muscle
(series 4, image 28). Postsurgical appearance of the paranasal
sinuses. Trace right maxillary sinus mucosal thickening at the
imaged levels.
IMPRESSION: No evidence of acute intracranial abnormality.

Chronic medially displaced fracture deformity of the left lamina
papyracea. Slightly distorted appearance of the left medial rectus
muscle. Correlate with physical exam findings to exclude extraocular
muscle entrapment.

Trace right maxillary sinus mucosal thickening.

## 2022-11-09 ENCOUNTER — Other Ambulatory Visit (HOSPITAL_COMMUNITY): Payer: Self-pay

## 2022-11-10 ENCOUNTER — Other Ambulatory Visit: Payer: Self-pay

## 2022-11-10 ENCOUNTER — Other Ambulatory Visit (HOSPITAL_COMMUNITY): Payer: Self-pay

## 2022-11-10 MED FILL — Fluticasone Propionate Nasal Susp 50 MCG/ACT: NASAL | 90 days supply | Qty: 48 | Fill #2 | Status: AC

## 2022-11-10 MED FILL — Omeprazole Cap Delayed Release 20 MG: ORAL | 90 days supply | Qty: 90 | Fill #2 | Status: AC

## 2022-11-10 MED FILL — Lisinopril Tab 5 MG: ORAL | 90 days supply | Qty: 90 | Fill #2 | Status: AC

## 2022-11-10 MED FILL — Lamotrigine Tab 200 MG: ORAL | 90 days supply | Qty: 90 | Fill #2 | Status: AC

## 2022-11-10 MED FILL — Furosemide Tab 40 MG: ORAL | 90 days supply | Qty: 90 | Fill #2 | Status: AC

## 2022-11-15 DIAGNOSIS — M48062 Spinal stenosis, lumbar region with neurogenic claudication: Secondary | ICD-10-CM | POA: Diagnosis not present

## 2022-11-17 NOTE — Progress Notes (Signed)
Subjective:     Patient ID: Brittany Acosta, female    DOB: [birth year], 72 y.o.   MRN: [id number]  No chief complaint on file.   HPI  Ankle swelling - ***  *** - ***  *** - ***   Health Maintenance Due  Topic Date Due   Medicare Annual Wellness (AWV)  09/04/2020   OPHTHALMOLOGY EXAM  06/04/2022   FOOT EXAM  09/23/2022   INFLUENZA VACCINE  11/04/2022    Past Medical History:  Diagnosis Date   Allergic rhinitis, cause unspecified    Arthritis    Bipolar disorder, unspecified (HCC)    depression   Cancer (HCC)    skin cancer on left leg, left breast cancer   Cataract    Chronic diastolic heart failure (HCC)    Depression    GERD (gastroesophageal reflux disease)    Headache    History of gout    "haven't had it in several years" (02/08/2012)   History of kidney stones    History of wrist fracture    rt wrist   Hypertension    Iron deficiency anemia    Kidney stones    sees urologist @ [facility]   Morbid obesity (HCC)    Neuropathy due to secondary diabetes (HCC)    Nontoxic multinodular goiter    Osteoporosis    Personal history of radiation therapy    Pneumonia    PONV (postoperative nausea and vomiting)    PTSD (post-traumatic stress disorder)    Pure hypercholesterolemia    RBBB    Scleroderma (HCC)    Sleep apnea 10/2012   mild osa-did not need cpap -dr clance   Type II diabetes mellitus (HCC)    Unspecified hypothyroidism     Past Surgical History:  Procedure Laterality Date   ABDOMINAL HYSTERECTOMY  1976   BREAST EXCISIONAL BIOPSY Left    BREAST LUMPECTOMY Left 04/25/2018   BREAST LUMPECTOMY WITH RADIOACTIVE SEED AND SENTINEL LYMPH NODE BIOPSY Left 04/25/2018   Procedure: LEFT BREAST LUMPECTOMY WITH RADIOACTIVE SEED AND SENTINEL LYMPH NODE BIOPSY;  Surgeon: Glenna Fellows, MD;  Location: [facility];  Service: General;  Laterality: Left;   BREAST SURGERY     CARDIAC CATHETERIZATION  2001   sees Dr Peter [place]   CATARACT EXTRACTION  2014   CATARACT  EXTRACTION, BILATERAL  02/2011   epps   CHOLECYSTECTOMY  1985   ESOPHAGOGASTRODUODENOSCOPY (EGD) WITH PROPOFOL N/A 10/14/2022   Procedure: ESOPHAGOGASTRODUODENOSCOPY (EGD) WITH PROPOFOL;  Surgeon: Lemar Lofty., MD;  Location: [facility];  Service: Gastroenterology;  Laterality: N/A;   EUS N/A 10/14/2022   Procedure: UPPER ENDOSCOPIC ULTRASOUND (EUS) RADIAL;  Surgeon: Lemar Lofty., MD;  Location: [facility];  Service: Gastroenterology;  Laterality: N/A;   EXCISIONAL HEMORRHOIDECTOMY     "dr cut out in his office" (02/08/2012)   INCISIONAL BREAST BIOPSY  2000   right   JOINT REPLACEMENT     rt knee   KNEE ARTHROSCOPY  08/2009   right   KNEE ARTHROSCOPY WITH MEDIAL MENISECTOMY     left   Left Cystoscopy   1990   LEFT OOPHORECTOMY  1980   Lithotripsy (L) Kidney  1997   LUMBAR LAMINECTOMY/DECOMPRESSION MICRODISCECTOMY Left 07/01/2016   Procedure: Laminectomy for synovial cyst - left - Lumbar four-five;  Surgeon: Julio Sicks, MD;  Location: [facility] OR;  Service: Neurosurgery;  Laterality: Left;   PARTIAL MASTECTOMY WITH NEEDLE LOCALIZATION Left 09/01/2012   Procedure: PARTIAL MASTECTOMY WITH NEEDLE LOCALIZATION;  Surgeon:  Ernestene Mention, MD;  Location: [facility] OR;  Service: General;  Laterality: Left;   Percitania stone removed (L) Kidney  1992   REVERSE SHOULDER ARTHROPLASTY Right 02/12/2021   Procedure: REVERSE SHOULDER ARTHROPLASTY;  Surgeon: Jones Broom, MD;  Location: [facility];  Service: Orthopedics;  Laterality: Right;   Right nasal surgery  08/1988   Right sinus removed  08/1989   tooth partial   ROTATOR CUFF REPAIR  2013   right shoulder x 2   SHOULDER ARTHROSCOPY WITH ROTATOR CUFF REPAIR AND SUBACROMIAL DECOMPRESSION Left 08/20/2013   Procedure: SHOULDER ARTHROSCOPY  AND SUBACROMIAL DECOMPRESSION;  Surgeon: Mable Paris, MD;  Location: [facility];  Service: Orthopedics;  Laterality: Left;  Left shoulder arthroscopy, debridement,  subacromial decompression, distal clavical resection   THYROIDECTOMY  04/22/2011   Procedure: THYROIDECTOMY;  Surgeon: Antony Contras, MD;  Location: [facility] OR;  Service: ENT;  Laterality: N/A;  TOTAL THYROIDECOTMY   TOTAL KNEE ARTHROPLASTY  06/18/2011   Procedure: TOTAL KNEE ARTHROPLASTY;  Surgeon: Nestor Lewandowsky, MD;  [facility]: [facility];  Service: Orthopedics;  Laterality: Left;  DEPUY   TOTAL KNEE ARTHROPLASTY  02/07/2012   Procedure: TOTAL KNEE ARTHROPLASTY;  Surgeon: Nestor Lewandowsky, MD;  Location: [facility];  Service: Orthopedics;  Laterality: Right;   TUBAL LIGATION  1972     Current Outpatient Medications:    allopurinol (ZYLOPRIM) 300 MG tablet, Take 1 tablet (300 mg total) by mouth daily., Disp: 90 tablet, Rfl: 2   anastrozole (ARIMIDEX) 1 MG tablet, Take 1 tablet (1 mg) by mouth daily., Disp: 90 tablet, Rfl: 3   aspirin EC 81 MG tablet, Take 1 tablet (81 mg total) by mouth daily. Swallow whole., Disp: 90 tablet, Rfl: 3   Biotin 16109 MCG TABS, Take 10,000 mcg by mouth at bedtime., Disp: , Rfl:    BLACK ELDERBERRY,BERRY-FLOWER, PO, Take 1 capsule by mouth in the morning., Disp: , Rfl:    cetirizine (ZYRTEC) 10 MG tablet, Take 20 mg by mouth in the morning., Disp: , Rfl:    Cholecalciferol (VITAMIN D3) 125 MCG (5000 UT) CAPS, Take 5,000 Units by mouth at bedtime., Disp: , Rfl:    CINNAMON PO, Take 2,000 mg by mouth in the morning., Disp: , Rfl:    Cranberry 500 MG TABS, Take 1,000 mg by mouth in the morning., Disp: , Rfl:    cyclobenzaprine (FLEXERIL) 10 MG tablet, Take 1 tablet (10 mg) by mouth at bedtime as needed for muscle spasms., Disp: 90 tablet, Rfl: 3   doxepin (SINEQUAN) 10 MG capsule, Take 1 capsule (10 mg total) by mouth daily., Disp: 90 capsule, Rfl: 1   ferrous sulfate 325 (65 FE) MG tablet, Take 325 mg by mouth in the morning., Disp: , Rfl:    fluticasone (FLONASE) 50 MCG/ACT nasal spray, Place 2 sprays into both nostrils daily. (Patient taking differently: Place 2 sprays into both  nostrils daily as needed (allergies.).), Disp: 48 g, Rfl: 2   folic acid (FOLVITE) 1 MG tablet, Take 2 mg by mouth in the morning., Disp: , Rfl:    furosemide (LASIX) 40 MG tablet, Take 1 tablet (40 mg total) by mouth daily., Disp: 90 tablet, Rfl: 10   glucose blood (TRUE METRIX BLOOD GLUCOSE TEST) test strip, TEST BLOOD SUGAR EVERY DAY AND AS NEEDED, Disp: 200 strip, Rfl: 2   Homeopathic Products (EARACHE DROPS OT), Place 1-2 drops in ear(s) 3 (three) times daily as needed (ear pain.)., Disp: , Rfl:  insulin degludec (TRESIBA FLEXTOUCH) 100 UNIT/ML FlexTouch Pen, Inject 26 Units into the skin daily. (Patient taking differently: Inject 23 Units into the skin in the morning.), Disp: 30 mL, Rfl: 2   Insulin Pen Needle (DROPLET PEN NEEDLES) 31G X 5 MM MISC, USE TO INJECT INSULIN DAILY, Disp: 100 each, Rfl: 3   lamoTRIgine (LAMICTAL) 200 MG tablet, TAKE 1 TABLET (200 MG TOTAL) BY MOUTH DAILY AT 12 NOON., Disp: 90 tablet, Rfl: 10   levothyroxine (SYNTHROID) 50 MCG tablet, Take 1 tablet (50 mcg total) by mouth daily., Disp: 90 tablet, Rfl: 2   lisinopril (ZESTRIL) 5 MG tablet, Take 1 tablet (5 mg total) by mouth daily., Disp: 90 tablet, Rfl: 10   magnesium oxide (MAG-OX) 400 (240 Mg) MG tablet, Take 400 mg by mouth in the morning., Disp: , Rfl:    metFORMIN (GLUCOPHAGE) 500 MG tablet, Take 2 tablets (1,000 mg total) by mouth every morning AND 1 tablet (500 mg total) every evening., Disp: 270 tablet, Rfl: 1   methocarbamol (ROBAXIN) 500 MG tablet, Take 1-2 tablet by mouth every six to eight hours as needed FOR SPASMS/MUSCLE TENSION, Disp: 60 tablet, Rfl: 2   omeprazole (PRILOSEC) 20 MG capsule, TAKE 1 CAPSULE (20 MG TOTAL) BY MOUTH DAILY. TAKE 30 MINUTES BEFORE A MEAL, Disp: 90 capsule, Rfl: 10   ondansetron (ZOFRAN-ODT) 4 MG disintegrating tablet, 4mg  ODT q4 hours prn nausea/vomit (Patient not taking: Reported on 10/08/2022), Disp: 20 tablet, Rfl: 0   oxybutynin (DITROPAN-XL) 10 MG 24 hr tablet, Take 1  tablet (10 mg total) by mouth at bedtime., Disp: 90 tablet, Rfl: 3   Polyethyl Glycol-Propyl Glycol (SYSTANE ULTRA) 0.4-0.3 % SOLN, Place 1-2 drops into both eyes 3 (three) times daily as needed (dry/irritated eyes)., Disp: , Rfl:    potassium chloride SA (KLOR-CON M) 20 MEQ tablet, Take 1 tablet (20 mEq total) by mouth daily. (Patient taking differently: Take 20 mEq by mouth daily with supper.), Disp: 90 tablet, Rfl: 3   pravastatin (PRAVACHOL) 20 MG tablet, Take 1 tablet (20 mg total) by mouth at bedtime., Disp: 90 tablet, Rfl: 2   predniSONE (DELTASONE) 5 MG tablet, Take 1 tablet (5 mg total) by mouth daily as directed, Disp: 90 tablet, Rfl: 9   sertraline (ZOLOFT) 100 MG tablet, Take 1 tablet (100 mg total) by mouth daily. (Patient taking differently: Take 100 mg by mouth at bedtime.), Disp: 90 tablet, Rfl: 1   sulfaSALAzine (AZULFIDINE) 500 MG tablet, Take 2 tablets by mouth 2 times daily., Disp: 360 tablet, Rfl: 1   tamsulosin (FLOMAX) 0.4 MG CAPS capsule, Take 1 capsule by mouth daily. (Patient taking differently: Take 0.4 mg by mouth at bedtime.), Disp: 90 capsule, Rfl: 3  No Known Allergies ROS neg/noncontributory except as noted HPI/below      Objective:     There were no vitals taken for this visit. Wt Readings from Last 3 Encounters:  10/14/22 211 lb (95.7 kg)  09/08/22 210 lb 6 oz (95.4 kg)  08/10/22 212 lb 8 oz (96.4 kg)    Physical Exam   Gen: WDWN NAD HEENT: NCAT, conjunctiva not injected, sclera nonicteric NECK:  supple, no thyromegaly, no nodes, no carotid bruits CARDIAC: RRR, S1S2+, no murmur. DP 2+B LUNGS: CTAB. No wheezes ABDOMEN:  BS+, soft, NTND, No HSM, no masses EXT:  no edema MSK: no gross abnormalities.  NEURO: A&O x3.  CN II-XII intact.  PSYCH: normal mood. Good eye contact     Assessment & Plan:  There  are no diagnoses linked to this encounter.  No follow-ups on file.  I,Rachel Rivera,acting as a scribe for Angelena Sole, MD.,have documented all  relevant documentation on the behalf of Angelena Sole, MD,as directed by  Angelena Sole, MD while in the presence of Angelena Sole, MD.  I, Isabelle Course, have reviewed all documentation for this visit. The documentation on 11/17/22 for the exam, diagnosis, procedures, and orders are all accurate and complete.  ***  Isabelle Course

## 2022-11-18 ENCOUNTER — Other Ambulatory Visit (HOSPITAL_COMMUNITY): Payer: Self-pay

## 2022-11-18 ENCOUNTER — Ambulatory Visit (INDEPENDENT_AMBULATORY_CARE_PROVIDER_SITE_OTHER): Payer: HMO | Admitting: Family Medicine

## 2022-11-18 ENCOUNTER — Encounter: Payer: Self-pay | Admitting: Family Medicine

## 2022-11-18 ENCOUNTER — Encounter (INDEPENDENT_AMBULATORY_CARE_PROVIDER_SITE_OTHER): Payer: Self-pay

## 2022-11-18 ENCOUNTER — Telehealth: Payer: Self-pay | Admitting: Gastroenterology

## 2022-11-18 VITALS — BP 132/68 | HR 68 | Temp 97.9°F | Resp 18 | Ht 61.0 in | Wt 215.4 lb

## 2022-11-18 DIAGNOSIS — M79605 Pain in left leg: Secondary | ICD-10-CM | POA: Diagnosis not present

## 2022-11-18 DIAGNOSIS — M25471 Effusion, right ankle: Secondary | ICD-10-CM

## 2022-11-18 DIAGNOSIS — M48061 Spinal stenosis, lumbar region without neurogenic claudication: Secondary | ICD-10-CM | POA: Diagnosis not present

## 2022-11-18 DIAGNOSIS — R110 Nausea: Secondary | ICD-10-CM

## 2022-11-18 DIAGNOSIS — M79604 Pain in right leg: Secondary | ICD-10-CM

## 2022-11-18 MED ORDER — ONDANSETRON 4 MG PO TBDP
4.0000 mg | ORAL_TABLET | ORAL | 3 refills | Status: AC | PRN
  Filled 2022-11-18: qty 30, 5d supply, fill #0
  Filled 2023-03-04: qty 30, 5d supply, fill #1

## 2022-11-18 NOTE — Telephone Encounter (Signed)
Patient called states she still has not received the EUS results from July requested a call back as soon as possible.

## 2022-11-18 NOTE — Telephone Encounter (Signed)
The pt has been advised that there were no biopsy's taken the day of her EUS. She did receive a copy of the procedure the day of the EUS.  She has read the report but thought there would be more results coming.  She was asked to call if she has any further questions or concerns.

## 2022-11-18 NOTE — Patient Instructions (Addendum)
It was very nice to see you today!  Keep leg elevated.  I'm ordering the artery studies.  Get with GI on nausea   PLEASE NOTE:  If you had any lab tests please let us know if you have not heard back within a few days. You may see your results on MyChart before we have a chance to review them but we will give you a call once they are reviewed by [place]. If we ordered any referrals today, please let us know if you have not heard from their office within the next week.   Please try these tips to maintain a healthy lifestyle:  Eat most of your calories during the day when you are active. Eliminate processed foods including packaged sweets (pies, cakes, cookies), reduce intake of potatoes, white bread, white pasta, and white rice. Look for whole grain options, oat flour or almond flour.  Each meal should contain half fruits/vegetables, one quarter protein, and one quarter carbs (no bigger than a computer mouse).  Cut down on sweet beverages. This includes juice, soda, and sweet tea. Also watch fruit intake, though this is a healthier sweet option, it still contains natural sugar! Limit to 3 servings daily.  Drink at least 1 glass of water with each meal and aim for at least 8 glasses per day  Exercise at least 150 minutes every week.

## 2022-12-07 DIAGNOSIS — M48062 Spinal stenosis, lumbar region with neurogenic claudication: Secondary | ICD-10-CM | POA: Diagnosis not present

## 2022-12-09 NOTE — Progress Notes (Signed)
Subjective:     Patient ID: Brittany Acosta, female    DOB: [birth year], 72 y.o.   MRN: [id number]  Chief Complaint  Patient presents with   Medical Management of Chronic Issues    3 month follow-up on dm, htn     HPI - here with husband.   HTN - Pt is on lisinopril 5 mg. States she had a steroid injection this Wednesday, which may contribute to her elevated Bp today. No ha/dizziness/cp/palp/edema/cough/sob.   DM, type 2 - She monitors her sugars at different times daily. Today her sugars were 97 and yesterday was 121. Taking 23 units of insulin. Last followed up with her eye doctor this spring, in feb/march.   Night sweats - Her husband states she sweats a lot at nighttime. She tends to wake up in the middle of the night with the back of her neck/head/hair will be wet from sweat. Her husband denies any snoring or difficulty breathing while she sleeps.   Headaches - She states she has constant headaches, which is normal for her.   Moods - Takes Zoloft 100 mg and doxepin 10 mg at nighttime. No SI.   Breast cancer - Hx of breat cancer in left breast. She has followed up at the [facility]. She reports she didn't have chemo, had radiation.   Takes her iron supplements daily.   Health Maintenance Due  Topic Date Due   Medicare Annual Wellness (AWV)  09/04/2020   OPHTHALMOLOGY EXAM  06/04/2022   FOOT EXAM  09/23/2022    Past Medical History:  Diagnosis Date   Allergic rhinitis, cause unspecified    Arthritis    Bipolar disorder, unspecified (HCC)    depression   Cancer (HCC)    skin cancer on left leg, left breast cancer   Cataract    Chronic diastolic heart failure (HCC)    Depression    GERD (gastroesophageal reflux disease)    Headache    History of gout    "haven't had it in several years" (02/08/2012)   History of kidney stones    History of wrist fracture    rt wrist   Hypertension    Iron deficiency anemia    Kidney stones    sees urologist @ [facility]    Morbid obesity (HCC)    Neuropathy due to secondary diabetes (HCC)    Nontoxic multinodular goiter    Osteoporosis    Personal history of radiation therapy    Pneumonia    PONV (postoperative nausea and vomiting)    Psoriatic arthritis (HCC)    PTSD (post-traumatic stress disorder)    Pure hypercholesterolemia    RBBB    Scleroderma (HCC)    Sleep apnea 10/2012   mild osa-did not need cpap -dr clance   Type II diabetes mellitus (HCC)    Unspecified hypothyroidism     Past Surgical History:  Procedure Laterality Date   ABDOMINAL HYSTERECTOMY  1976   BREAST EXCISIONAL BIOPSY Left    BREAST LUMPECTOMY Left 04/25/2018   BREAST LUMPECTOMY WITH RADIOACTIVE SEED AND SENTINEL LYMPH NODE BIOPSY Left 04/25/2018   Procedure: LEFT BREAST LUMPECTOMY WITH RADIOACTIVE SEED AND SENTINEL LYMPH NODE BIOPSY;  Surgeon: Glenna Fellows, MD;  Location: [facility] OR;  Service: General;  Laterality: Left;   BREAST SURGERY     CARDIAC CATHETERIZATION  2001   sees Dr Peter [place]   CATARACT EXTRACTION  2014   CATARACT EXTRACTION, BILATERAL  02/2011   epps  CHOLECYSTECTOMY  1985   ESOPHAGOGASTRODUODENOSCOPY (EGD) WITH PROPOFOL N/A 10/14/2022   Procedure: ESOPHAGOGASTRODUODENOSCOPY (EGD) WITH PROPOFOL;  Surgeon: Meridee Score Netty Starring., MD;  Location: [facility];  Service: Gastroenterology;  Laterality: N/A;   EUS N/A 10/14/2022   Procedure: UPPER ENDOSCOPIC ULTRASOUND (EUS) RADIAL;  Surgeon: Lemar Lofty., MD;  Location: [facility];  Service: Gastroenterology;  Laterality: N/A;   EXCISIONAL HEMORRHOIDECTOMY     "dr cut out in his office" (02/08/2012)   INCISIONAL BREAST BIOPSY  2000   right   JOINT REPLACEMENT     rt knee   KNEE ARTHROSCOPY  08/2009   right   KNEE ARTHROSCOPY WITH MEDIAL MENISECTOMY     left   Left Cystoscopy   1990   LEFT OOPHORECTOMY  1980   Lithotripsy (L) Kidney  1997   LUMBAR LAMINECTOMY/DECOMPRESSION MICRODISCECTOMY Left 07/01/2016   Procedure: Laminectomy for  synovial cyst - left - Lumbar four-five;  Surgeon: Julio Sicks, MD;  Location: [facility] OR;  Service: Neurosurgery;  Laterality: Left;   PARTIAL MASTECTOMY WITH NEEDLE LOCALIZATION Left 09/01/2012   Procedure: PARTIAL MASTECTOMY WITH NEEDLE LOCALIZATION;  Surgeon: Ernestene Mention, MD;  Location: [facility];  Service: General;  Laterality: Left;   Percitania stone removed (L) Kidney  1992   REVERSE SHOULDER ARTHROPLASTY Right 02/12/2021   Procedure: REVERSE SHOULDER ARTHROPLASTY;  Surgeon: Jones Broom, MD;  Location: [facility];  Service: Orthopedics;  Laterality: Right;   Right nasal surgery  08/1988   Right sinus removed  08/1989   tooth partial   ROTATOR CUFF REPAIR  2013   right shoulder x 2   SHOULDER ARTHROSCOPY WITH ROTATOR CUFF REPAIR AND SUBACROMIAL DECOMPRESSION Left 08/20/2013   Procedure: SHOULDER ARTHROSCOPY  AND SUBACROMIAL DECOMPRESSION;  Surgeon: Mable Paris, MD;  Location: [facility];  Service: Orthopedics;  Laterality: Left;  Left shoulder arthroscopy, debridement, subacromial decompression, distal clavical resection   THYROIDECTOMY  04/22/2011   Procedure: THYROIDECTOMY;  Surgeon: Antony Contras, MD;  Location: [facility] OR;  Service: ENT;  Laterality: N/A;  TOTAL THYROIDECOTMY   TOTAL KNEE ARTHROPLASTY  06/18/2011   Procedure: TOTAL KNEE ARTHROPLASTY;  Surgeon: Nestor Lewandowsky, MD;  Location: [facility];  Service: Orthopedics;  Laterality: Left;  DEPUY   TOTAL KNEE ARTHROPLASTY  02/07/2012   Procedure: TOTAL KNEE ARTHROPLASTY;  Surgeon: Nestor Lewandowsky, MD;  Location: [facility];  Service: Orthopedics;  Laterality: Right;   TUBAL LIGATION  1972     Current Outpatient Medications:    allopurinol (ZYLOPRIM) 300 MG tablet, Take 1 tablet (300 mg total) by mouth daily., Disp: 90 tablet, Rfl: 2   anastrozole (ARIMIDEX) 1 MG tablet, Take 1 tablet (1 mg) by mouth daily., Disp: 90 tablet, Rfl: 3   aspirin EC 81 MG tablet, Take 1 tablet (81 mg total) by mouth daily. Swallow whole.,  Disp: 90 tablet, Rfl: 3   Biotin 32951 MCG TABS, Take 10,000 mcg by mouth at bedtime., Disp: , Rfl:    BLACK ELDERBERRY,BERRY-FLOWER, PO, Take 1 capsule by mouth in the morning., Disp: , Rfl:    cetirizine (ZYRTEC) 10 MG tablet, Take 20 mg by mouth in the morning., Disp: , Rfl:    Cholecalciferol (VITAMIN D3) 125 MCG (5000 UT) CAPS, Take 5,000 Units by mouth at bedtime., Disp: , Rfl:    CINNAMON PO, Take 2,000 mg by mouth in the morning., Disp: , Rfl:    Cranberry 500 MG TABS, Take 1,000 mg by mouth in the morning., Disp: , Rfl:  cyclobenzaprine (FLEXERIL) 10 MG tablet, Take 1 tablet (10 mg) by mouth at bedtime as needed for muscle spasms., Disp: 90 tablet, Rfl: 3   doxepin (SINEQUAN) 10 MG capsule, Take 1 capsule (10 mg total) by mouth daily., Disp: 90 capsule, Rfl: 1   ferrous sulfate 325 (65 FE) MG tablet, Take 325 mg by mouth in the morning., Disp: , Rfl:    fluticasone (FLONASE) 50 MCG/ACT nasal spray, Place 2 sprays into both nostrils daily. (Patient taking differently: Place 2 sprays into both nostrils daily as needed (allergies.).), Disp: 48 g, Rfl: 2   folic acid (FOLVITE) 1 MG tablet, Take 2 mg by mouth in the morning., Disp: , Rfl:    furosemide (LASIX) 40 MG tablet, Take 1 tablet (40 mg total) by mouth daily., Disp: 90 tablet, Rfl: 10   glucose blood (TRUE METRIX BLOOD GLUCOSE TEST) test strip, TEST BLOOD SUGAR EVERY DAY AND AS NEEDED, Disp: 200 strip, Rfl: 2   Homeopathic Products (EARACHE DROPS OT), Place 1-2 drops in ear(s) 3 (three) times daily as needed (ear pain.)., Disp: , Rfl:    insulin degludec (TRESIBA FLEXTOUCH) 100 UNIT/ML FlexTouch Pen, Inject 26 Units into the skin daily. (Patient taking differently: Inject 23 Units into the skin in the morning.), Disp: 30 mL, Rfl: 2   Insulin Pen Needle (DROPLET PEN NEEDLES) 31G X 5 MM MISC, USE TO INJECT INSULIN DAILY, Disp: 100 each, Rfl: 3   lamoTRIgine (LAMICTAL) 200 MG tablet, TAKE 1 TABLET (200 MG TOTAL) BY MOUTH DAILY AT 12  NOON., Disp: 90 tablet, Rfl: 10   levothyroxine (SYNTHROID) 50 MCG tablet, Take 1 tablet (50 mcg total) by mouth daily., Disp: 90 tablet, Rfl: 2   lisinopril (ZESTRIL) 5 MG tablet, Take 1 tablet (5 mg total) by mouth daily., Disp: 90 tablet, Rfl: 10   magnesium oxide (MAG-OX) 400 (240 Mg) MG tablet, Take 400 mg by mouth in the morning., Disp: , Rfl:    metFORMIN (GLUCOPHAGE) 500 MG tablet, Take 2 tablets (1,000 mg total) by mouth every morning AND 1 tablet (500 mg total) every evening., Disp: 270 tablet, Rfl: 1   omeprazole (PRILOSEC) 20 MG capsule, TAKE 1 CAPSULE (20 MG TOTAL) BY MOUTH DAILY. TAKE 30 MINUTES BEFORE A MEAL, Disp: 90 capsule, Rfl: 10   ondansetron (ZOFRAN-ODT) 4 MG disintegrating tablet, Take 1 tablet (4 mg total) by mouth every 4 (four) hours as needed for nausea/vomiting, Disp: 30 tablet, Rfl: 3   oxybutynin (DITROPAN-XL) 10 MG 24 hr tablet, Take 1 tablet (10 mg total) by mouth at bedtime., Disp: 90 tablet, Rfl: 3   Polyethyl Glycol-Propyl Glycol (SYSTANE ULTRA) 0.4-0.3 % SOLN, Place 1-2 drops into both eyes 3 (three) times daily as needed (dry/irritated eyes)., Disp: , Rfl:    potassium chloride SA (KLOR-CON M) 20 MEQ tablet, Take 1 tablet (20 mEq total) by mouth daily. (Patient taking differently: Take 20 mEq by mouth daily with supper.), Disp: 90 tablet, Rfl: 3   pravastatin (PRAVACHOL) 20 MG tablet, Take 1 tablet (20 mg total) by mouth at bedtime., Disp: 90 tablet, Rfl: 2   predniSONE (DELTASONE) 5 MG tablet, Take 1 tablet (5 mg total) by mouth daily as directed, Disp: 90 tablet, Rfl: 9   sertraline (ZOLOFT) 100 MG tablet, Take 1 tablet (100 mg total) by mouth daily. (Patient taking differently: Take 100 mg by mouth at bedtime.), Disp: 90 tablet, Rfl: 1   sulfaSALAzine (AZULFIDINE) 500 MG tablet, Take 2 tablets by mouth 2 times daily., Disp: 360  tablet, Rfl: 1   tamsulosin (FLOMAX) 0.4 MG CAPS capsule, Take 1 capsule by mouth daily. (Patient taking differently: Take 0.4 mg by  mouth at bedtime.), Disp: 90 capsule, Rfl: 3  No Known Allergies ROS neg/noncontributory except as noted HPI/below      Objective:     BP (!) 148/78 (BP Location: Right Arm, Patient Position: Sitting, Cuff Size: Large)   Pulse 74   Temp 97.6 F (36.4 C) (Temporal)   Resp 18   Ht 5\' 1"  (1.549 m)   Wt 213 lb 8 oz (96.8 kg)   SpO2 98%   BMI 40.34 kg/m  Wt Readings from Last 3 Encounters:  12/10/22 213 lb 8 oz (96.8 kg)  11/18/22 215 lb 6 oz (97.7 kg)  10/14/22 211 lb (95.7 kg)    Physical Exam   Gen: WDWN NAD HEENT: NCAT, conjunctiva not injected, sclera nonicteric NECK:  supple, no thyromegaly, no nodes, no carotid bruits CARDIAC: RRR, S1S2+, no murmur. DP 2+B LUNGS: CTAB. No wheezes ABDOMEN:  BS+, soft, NTND, No HSM, no masses EXT:  no edema MSK: no gross abnormalities.  NEURO: A&O x3.  CN II-XII intact.  PSYCH: normal mood. Good eye contact     Assessment & Plan:  Essential hypertension  Type 2 diabetes mellitus with obesity (HCC) -     Comprehensive metabolic panel -     Hemoglobin A1c  Hypomagnesemia -     Magnesium  Iron deficiency anemia due to chronic blood loss -     CBC with Differential/Platelet -     Iron, TIBC and Ferritin Panel  B12 deficiency -     Vitamin B12    Return in about 3 months (around 03/11/2023) for chronic follow-up.   *** look for referral to hematology  *** Fill medications   I,Rachel Rivera,acting as a scribe for Angelena Sole, MD.,have documented all relevant documentation on the behalf of Angelena Sole, MD,as directed by  Angelena Sole, MD while in the presence of Angelena Sole, MD.  I, Isabelle Course, have reviewed all documentation for this visit. The documentation on 12/10/22 for the exam, diagnosis, procedures, and orders are all accurate and complete.  *** Isabelle Course

## 2022-12-10 ENCOUNTER — Encounter: Payer: Self-pay | Admitting: Family Medicine

## 2022-12-10 ENCOUNTER — Ambulatory Visit (INDEPENDENT_AMBULATORY_CARE_PROVIDER_SITE_OTHER): Payer: HMO | Admitting: Family Medicine

## 2022-12-10 VITALS — BP 148/78 | HR 74 | Temp 97.6°F | Resp 18 | Ht 61.0 in | Wt 213.5 lb

## 2022-12-10 DIAGNOSIS — E890 Postprocedural hypothyroidism: Secondary | ICD-10-CM | POA: Diagnosis not present

## 2022-12-10 DIAGNOSIS — C50412 Malignant neoplasm of upper-outer quadrant of left female breast: Secondary | ICD-10-CM | POA: Diagnosis not present

## 2022-12-10 DIAGNOSIS — Z7984 Long term (current) use of oral hypoglycemic drugs: Secondary | ICD-10-CM | POA: Diagnosis not present

## 2022-12-10 DIAGNOSIS — E1140 Type 2 diabetes mellitus with diabetic neuropathy, unspecified: Secondary | ICD-10-CM

## 2022-12-10 DIAGNOSIS — E538 Deficiency of other specified B group vitamins: Secondary | ICD-10-CM

## 2022-12-10 DIAGNOSIS — Z794 Long term (current) use of insulin: Secondary | ICD-10-CM

## 2022-12-10 DIAGNOSIS — Z170 Estrogen receptor positive status [ER+]: Secondary | ICD-10-CM | POA: Diagnosis not present

## 2022-12-10 DIAGNOSIS — D500 Iron deficiency anemia secondary to blood loss (chronic): Secondary | ICD-10-CM

## 2022-12-10 DIAGNOSIS — L4050 Arthropathic psoriasis, unspecified: Secondary | ICD-10-CM | POA: Diagnosis not present

## 2022-12-10 DIAGNOSIS — E669 Obesity, unspecified: Secondary | ICD-10-CM | POA: Diagnosis not present

## 2022-12-10 DIAGNOSIS — E1169 Type 2 diabetes mellitus with other specified complication: Secondary | ICD-10-CM | POA: Diagnosis not present

## 2022-12-10 DIAGNOSIS — I10 Essential (primary) hypertension: Secondary | ICD-10-CM

## 2022-12-10 NOTE — Patient Instructions (Signed)
It was very nice to see you today!  Check sugar when wake up sweating   PLEASE NOTE:  If you had any lab tests please let us know if you have not heard back within a few days. You may see your results on MyChart before we have a chance to review them but we will give you a call once they are reviewed by [place]. If we ordered any referrals today, please let us know if you have not heard from their office within the next week.   Please try these tips to maintain a healthy lifestyle:  Eat most of your calories during the day when you are active. Eliminate processed foods including packaged sweets (pies, cakes, cookies), reduce intake of potatoes, white bread, white pasta, and white rice. Look for whole grain options, oat flour or almond flour.  Each meal should contain half fruits/vegetables, one quarter protein, and one quarter carbs (no bigger than a computer mouse).  Cut down on sweet beverages. This includes juice, soda, and sweet tea. Also watch fruit intake, though this is a healthier sweet option, it still contains natural sugar! Limit to 3 servings daily.  Drink at least 1 glass of water with each meal and aim for at least 8 glasses per day  Exercise at least 150 minutes every week.

## 2022-12-11 LAB — CBC WITH DIFFERENTIAL/PLATELET
Absolute Monocytes: 1076 {cells}/uL — ABNORMAL HIGH (ref 200–950)
Basophils Absolute: 55 {cells}/uL (ref 0–200)
Basophils Relative: 0.4 %
Eosinophils Absolute: 138 {cells}/uL (ref 15–500)
Eosinophils Relative: 1 %
HCT: 37.1 % (ref 35.0–45.0)
Hemoglobin: 12.1 g/dL (ref 11.7–15.5)
Lymphs Abs: 3367 {cells}/uL (ref 850–3900)
MCH: 27.2 pg (ref 27.0–33.0)
MCHC: 32.6 g/dL (ref 32.0–36.0)
MCV: 83.4 fL (ref 80.0–100.0)
MPV: 9.1 fL (ref 7.5–12.5)
Monocytes Relative: 7.8 %
Neutro Abs: 9163 {cells}/uL — ABNORMAL HIGH (ref 1500–7800)
Neutrophils Relative %: 66.4 %
Platelets: 484 10*3/uL — ABNORMAL HIGH (ref 140–400)
RBC: 4.45 10*6/uL (ref 3.80–5.10)
RDW: 14.9 % (ref 11.0–15.0)
Total Lymphocyte: 24.4 %
WBC: 13.8 10*3/uL — ABNORMAL HIGH (ref 3.8–10.8)

## 2022-12-11 LAB — COMPREHENSIVE METABOLIC PANEL
AG Ratio: 1.5 (calc) (ref 1.0–2.5)
ALT: 17 U/L (ref 6–29)
AST: 20 U/L (ref 10–35)
Albumin: 4.3 g/dL (ref 3.6–5.1)
Alkaline phosphatase (APISO): 100 U/L (ref 37–153)
BUN: 12 mg/dL (ref 7–25)
CO2: 29 mmol/L (ref 20–32)
Calcium: 9.7 mg/dL (ref 8.6–10.4)
Chloride: 94 mmol/L — ABNORMAL LOW (ref 98–110)
Creat: 0.65 mg/dL (ref 0.60–1.00)
Globulin: 2.8 g/dL (ref 1.9–3.7)
Glucose, Bld: 91 mg/dL (ref 65–99)
Potassium: 4.2 mmol/L (ref 3.5–5.3)
Sodium: 136 mmol/L (ref 135–146)
Total Bilirubin: 0.3 mg/dL (ref 0.2–1.2)
Total Protein: 7.1 g/dL (ref 6.1–8.1)

## 2022-12-11 LAB — HEMOGLOBIN A1C
Hgb A1c MFr Bld: 7.3 %{Hb} — ABNORMAL HIGH (ref <5.7)
Mean Plasma Glucose: 163 mg/dL
eAG (mmol/L): 9 mmol/L

## 2022-12-11 LAB — IRON,TIBC AND FERRITIN PANEL
%SAT: 19 % (ref 16–45)
Ferritin: 25 ng/mL (ref 16–288)
Iron: 61 ug/dL (ref 45–160)
TIBC: 321 mcg/dL (calc) (ref 250–450)

## 2022-12-11 LAB — VITAMIN B12: Vitamin B-12: 520 pg/mL (ref 200–1100)

## 2022-12-11 LAB — MAGNESIUM: Magnesium: 1.7 mg/dL (ref 1.5–2.5)

## 2022-12-12 ENCOUNTER — Other Ambulatory Visit (HOSPITAL_COMMUNITY): Payer: Self-pay

## 2022-12-12 MED ORDER — SERTRALINE HCL 100 MG PO TABS
100.0000 mg | ORAL_TABLET | Freq: Every day | ORAL | 1 refills | Status: DC
  Filled 2022-12-12 – 2023-02-06 (×3): qty 90, 90d supply, fill #0
  Filled 2023-05-03: qty 90, 90d supply, fill #1

## 2022-12-12 MED ORDER — ALLOPURINOL 300 MG PO TABS
300.0000 mg | ORAL_TABLET | Freq: Every day | ORAL | 3 refills | Status: DC
  Filled 2022-12-12 – 2023-02-08 (×3): qty 90, 90d supply, fill #0
  Filled 2023-05-07: qty 90, 90d supply, fill #1
  Filled 2023-08-31: qty 90, 90d supply, fill #2

## 2022-12-12 MED ORDER — PRAVASTATIN SODIUM 20 MG PO TABS
20.0000 mg | ORAL_TABLET | Freq: Every day | ORAL | 2 refills | Status: DC
  Filled 2022-12-12 – 2023-02-08 (×3): qty 90, 90d supply, fill #0
  Filled 2023-05-07: qty 90, 90d supply, fill #1
  Filled 2023-08-31: qty 90, 90d supply, fill #2

## 2022-12-12 MED ORDER — DOXEPIN HCL 10 MG PO CAPS
10.0000 mg | ORAL_CAPSULE | Freq: Every day | ORAL | 1 refills | Status: DC
  Filled 2022-12-12 – 2023-02-08 (×2): qty 90, 90d supply, fill #0
  Filled 2023-05-07: qty 90, 90d supply, fill #1

## 2022-12-12 MED ORDER — OXYBUTYNIN CHLORIDE ER 10 MG PO TB24
10.0000 mg | ORAL_TABLET | Freq: Every day | ORAL | 3 refills | Status: DC
  Filled 2022-12-12 – 2023-02-08 (×2): qty 90, 90d supply, fill #0
  Filled 2023-05-07: qty 90, 90d supply, fill #1
  Filled 2023-08-31: qty 90, 90d supply, fill #2

## 2022-12-12 MED ORDER — LEVOTHYROXINE SODIUM 50 MCG PO TABS
50.0000 ug | ORAL_TABLET | Freq: Every day | ORAL | 3 refills | Status: DC
Start: 2022-12-12 — End: 2024-01-26
  Filled 2022-12-12 – 2023-02-08 (×2): qty 90, 90d supply, fill #0
  Filled 2023-05-07: qty 90, 90d supply, fill #1
  Filled 2023-08-31: qty 90, 90d supply, fill #2

## 2022-12-12 NOTE — Assessment & Plan Note (Signed)
Fair control.  A1c usually in the low sevens.  Continue metformin 1000 mg in the morning, 500 mg in the evening and Tresiba 23 units daily

## 2022-12-12 NOTE — Assessment & Plan Note (Signed)
Chronic.  Probably due to metformin and PPI.  Taking over-the-counter

## 2022-12-12 NOTE — Assessment & Plan Note (Addendum)
Continue iron supplements.  She is getting a lot of constipation.  EGD and colonoscopy were unrevealing for source of bleeding.  Refer back to hematology

## 2022-12-12 NOTE — Assessment & Plan Note (Signed)
Chronic.  Managed by rheumatology.  Continue sulfasalazine 1000 mg twice daily and prednisone 5 mg daily

## 2022-12-12 NOTE — Assessment & Plan Note (Signed)
Chronic.  S/p rad tx.  Now on anastrozole.  Pt has not had f/u onc in several years and pre PCP an we have been filling meds.  Needs to f/u w/them for plan and now anemia

## 2022-12-12 NOTE — Assessment & Plan Note (Signed)
Chronic.  Controlled.  Continue Synthroid 50 mcg

## 2022-12-12 NOTE — Progress Notes (Signed)
Anemia is better and iron is better.  However I still want her to see hematology as white blood cell count is little bit elevated and platelets.  Also for follow-up on her breast cancer (referral has already been placed).  I want her to stay on the iron for now as the stores are still a little bit low. 2.  A1c is still a bit above goal of 7.  Continue to work on it.

## 2022-12-12 NOTE — Assessment & Plan Note (Signed)
Chronic.  Possibly due to PPI.  Check magnesium

## 2022-12-12 NOTE — Assessment & Plan Note (Signed)
Chronic.  Usually well-controlled.  Elevated today.  Monitor at home and let us know results.  Continue lisinopril 5 mg daily

## 2022-12-13 ENCOUNTER — Other Ambulatory Visit (HOSPITAL_COMMUNITY): Payer: Self-pay

## 2022-12-15 ENCOUNTER — Ambulatory Visit (HOSPITAL_BASED_OUTPATIENT_CLINIC_OR_DEPARTMENT_OTHER): Payer: HMO

## 2022-12-15 DIAGNOSIS — M79605 Pain in left leg: Secondary | ICD-10-CM

## 2022-12-15 DIAGNOSIS — M79604 Pain in right leg: Secondary | ICD-10-CM

## 2022-12-19 LAB — VAS US ABI WITH/WO TBI
Left ABI: 1.12
Right ABI: 1.15

## 2022-12-23 ENCOUNTER — Telehealth (HOSPITAL_BASED_OUTPATIENT_CLINIC_OR_DEPARTMENT_OTHER): Payer: Self-pay | Admitting: Cardiology

## 2022-12-23 NOTE — Telephone Encounter (Signed)
Left message to call back  

## 2022-12-23 NOTE — Telephone Encounter (Signed)
Patient stated she has been having discomfort in her left arm and headaches for the last 3 weeks.  Patient wants a call back to discuss.

## 2022-12-24 NOTE — Telephone Encounter (Signed)
Left message to call back and sent mychart message

## 2022-12-24 NOTE — Telephone Encounter (Addendum)
Spoke with patient regarding left arm pain Pain comes on at random times, at rest and when up moving around. Is not brought on by movement of arm. Does have hard time lifting arm above head because of pain when she is having the arm pain Episodes will last about 15-20 minutes before easing off  Left arm pain is not consistent and is couple inches from chest Has sweats at times that she discussed with her PCP last week, forgot to discuss arm pain  Nausea at times, not new  Denies shortness of breath or chest pain Strong family history of heart disease Will forward to Ronn Melena NP for review

## 2022-12-24 NOTE — Telephone Encounter (Signed)
Atypical for angina as occurs at rest and worsens with movement. Would recommend OTC ibuprofen/tylenol for pain relief and follow up with PCP.   Alver Sorrow, NP

## 2022-12-24 NOTE — Telephone Encounter (Signed)
Patient viewed mychart message  Hello [name],    I discussed with Ronn Melena NP who dis agree with contacting your primary care regarding the arm pain. If they feel it is cardiac please let us know. She also suggested Ibuprofen/tylenol for pain relief. If you have any further question let us know   [name]   Last read by Frutoso Schatz at  2:22 PM on 12/24/2022.

## 2022-12-25 ENCOUNTER — Other Ambulatory Visit (HOSPITAL_COMMUNITY): Payer: Self-pay

## 2022-12-28 ENCOUNTER — Inpatient Hospital Stay: Payer: HMO

## 2022-12-28 ENCOUNTER — Other Ambulatory Visit: Payer: Self-pay

## 2022-12-28 ENCOUNTER — Inpatient Hospital Stay: Payer: HMO | Attending: Hematology and Oncology | Admitting: Hematology and Oncology

## 2022-12-28 VITALS — BP 159/54 | HR 75 | Temp 97.3°F | Resp 18 | Ht 61.0 in | Wt 218.6 lb

## 2022-12-28 DIAGNOSIS — D509 Iron deficiency anemia, unspecified: Secondary | ICD-10-CM | POA: Diagnosis not present

## 2022-12-28 DIAGNOSIS — Z79811 Long term (current) use of aromatase inhibitors: Secondary | ICD-10-CM | POA: Insufficient documentation

## 2022-12-28 DIAGNOSIS — Z170 Estrogen receptor positive status [ER+]: Secondary | ICD-10-CM | POA: Diagnosis not present

## 2022-12-28 DIAGNOSIS — Z87442 Personal history of urinary calculi: Secondary | ICD-10-CM | POA: Insufficient documentation

## 2022-12-28 DIAGNOSIS — Z833 Family history of diabetes mellitus: Secondary | ICD-10-CM | POA: Insufficient documentation

## 2022-12-28 DIAGNOSIS — Z90721 Acquired absence of ovaries, unilateral: Secondary | ICD-10-CM | POA: Insufficient documentation

## 2022-12-28 DIAGNOSIS — K219 Gastro-esophageal reflux disease without esophagitis: Secondary | ICD-10-CM | POA: Insufficient documentation

## 2022-12-28 DIAGNOSIS — Z85828 Personal history of other malignant neoplasm of skin: Secondary | ICD-10-CM | POA: Insufficient documentation

## 2022-12-28 DIAGNOSIS — Z8249 Family history of ischemic heart disease and other diseases of the circulatory system: Secondary | ICD-10-CM | POA: Diagnosis not present

## 2022-12-28 DIAGNOSIS — E119 Type 2 diabetes mellitus without complications: Secondary | ICD-10-CM | POA: Diagnosis not present

## 2022-12-28 DIAGNOSIS — Z923 Personal history of irradiation: Secondary | ICD-10-CM | POA: Diagnosis not present

## 2022-12-28 DIAGNOSIS — Z87891 Personal history of nicotine dependence: Secondary | ICD-10-CM | POA: Insufficient documentation

## 2022-12-28 DIAGNOSIS — C50412 Malignant neoplasm of upper-outer quadrant of left female breast: Secondary | ICD-10-CM | POA: Insufficient documentation

## 2022-12-28 DIAGNOSIS — E039 Hypothyroidism, unspecified: Secondary | ICD-10-CM | POA: Diagnosis not present

## 2022-12-28 DIAGNOSIS — I5032 Chronic diastolic (congestive) heart failure: Secondary | ICD-10-CM | POA: Diagnosis not present

## 2022-12-28 DIAGNOSIS — Z9049 Acquired absence of other specified parts of digestive tract: Secondary | ICD-10-CM | POA: Diagnosis not present

## 2022-12-28 DIAGNOSIS — D72829 Elevated white blood cell count, unspecified: Secondary | ICD-10-CM | POA: Insufficient documentation

## 2022-12-28 DIAGNOSIS — L4050 Arthropathic psoriasis, unspecified: Secondary | ICD-10-CM | POA: Diagnosis not present

## 2022-12-28 DIAGNOSIS — Z801 Family history of malignant neoplasm of trachea, bronchus and lung: Secondary | ICD-10-CM | POA: Diagnosis not present

## 2022-12-28 DIAGNOSIS — I110 Hypertensive heart disease with heart failure: Secondary | ICD-10-CM | POA: Insufficient documentation

## 2022-12-28 DIAGNOSIS — Z90710 Acquired absence of both cervix and uterus: Secondary | ICD-10-CM | POA: Diagnosis not present

## 2022-12-28 DIAGNOSIS — Z83438 Family history of other disorder of lipoprotein metabolism and other lipidemia: Secondary | ICD-10-CM | POA: Insufficient documentation

## 2022-12-28 DIAGNOSIS — Z79899 Other long term (current) drug therapy: Secondary | ICD-10-CM | POA: Insufficient documentation

## 2022-12-28 DIAGNOSIS — E7800 Pure hypercholesterolemia, unspecified: Secondary | ICD-10-CM | POA: Insufficient documentation

## 2022-12-28 DIAGNOSIS — Z9012 Acquired absence of left breast and nipple: Secondary | ICD-10-CM | POA: Diagnosis not present

## 2022-12-28 NOTE — Progress Notes (Signed)
[facility] CONSULT NOTE  Patient Care Team: Jeani Sow, MD as PCP - General (Family Medicine) Jodelle Red, MD as PCP - Cardiology (Cardiology) Romero Belling, MD (Inactive) as Consulting Physician (Endocrinology) Gean Birchwood, MD as Consulting Physician (Orthopedic Surgery) Charna Elizabeth, MD as Consulting Physician (Gastroenterology) Christia Reading, MD as Consulting Physician (Otolaryngology) Kalman Shan, MD (Pulmonary Disease) Alvan Dame, DPM (Podiatry) Teresa Coombs, MD (Ophthalmology) Archer Asa, MD (Psychiatry) Jones Broom, MD (Orthopedic Surgery) Serena Croissant, MD as Consulting Physician (Hematology and Oncology) Charlcie Cradle as Radiation Oncologist (Radiology) Casimer Lanius, MD as Consulting Physician (Rheumatology) Andrena Mews, DO as Referring Physician (Sports Medicine) Erroll Luna, [facility] (Inactive) as Pharmacist (Pharmacist)  CHIEF COMPLAINTS/PURPOSE OF CONSULTATION:  History of breast cancer, iron deficiency anemia and leukocytosis  HISTORY OF PRESENTING ILLNESS:  Brittany Acosta 72 y.o. female is here because of prior history of breast cancer that was diagnosed in 2020 that was treated with lumpectomy and radiation and has been on oral antiestrogen therapy with anastrozole for the past 4-1/2 years.  She completes 5 years of anastrozole therapy by June 2025.  Patient's primary care physician has been monitoring her mammograms and continuing her antiestrogen therapy.  She has been doing quite well from the breast cancer standpoint.  She was diagnosed with iron deficiency anemia a few months ago and was prescribed oral iron therapy.  She had upper endoscopy and colonoscopy which was negative.  Repeat iron labs 2 weeks ago showed an improvement markedly in her hemoglobin as well as iron parameters. She was noted to have elevated white blood cell count especially evaded neutrophils and she was referred to [place] for a  conversation about all of these independent issues.  Patient tells me that she did not wish to follow-up with as previously mainly to minimize the number of doctors appointments etc.  I reviewed her records extensively and collaborated the history with the patient.   MEDICAL HISTORY:  Past Medical History:  Diagnosis Date   Allergic rhinitis, cause unspecified    Arthritis    Bipolar disorder, unspecified (HCC)    depression   Cancer (HCC)    skin cancer on left leg, left breast cancer   Cataract    Chronic diastolic heart failure (HCC)    Depression    GERD (gastroesophageal reflux disease)    Headache    History of gout    "haven't had it in several years" (02/08/2012)   History of kidney stones    History of wrist fracture    rt wrist   Hypertension    Iron deficiency anemia    Kidney stones    sees urologist @ [facility]   Morbid obesity (HCC)    Neuropathy due to secondary diabetes (HCC)    Nontoxic multinodular goiter    Osteoporosis    Personal history of radiation therapy    Pneumonia    PONV (postoperative nausea and vomiting)    Psoriatic arthritis (HCC)    PTSD (post-traumatic stress disorder)    Pure hypercholesterolemia    RBBB    Scleroderma (HCC)    Sleep apnea 10/2012   mild osa-did not need cpap -dr clance   Type II diabetes mellitus (HCC)    Unspecified hypothyroidism     SURGICAL HISTORY: Past Surgical History:  Procedure Laterality Date   ABDOMINAL HYSTERECTOMY  1976   BREAST EXCISIONAL BIOPSY Left    BREAST LUMPECTOMY Left 04/25/2018   BREAST LUMPECTOMY WITH RADIOACTIVE SEED AND SENTINEL LYMPH  NODE BIOPSY Left 04/25/2018   Procedure: LEFT BREAST LUMPECTOMY WITH RADIOACTIVE SEED AND SENTINEL LYMPH NODE BIOPSY;  Surgeon: Glenna Fellows, MD;  Location: [facility] OR;  Service: General;  Laterality: Left;   BREAST SURGERY     CARDIAC CATHETERIZATION  2001   sees Dr Peter [place]   CATARACT EXTRACTION  2014   CATARACT EXTRACTION, BILATERAL  02/2011    epps   CHOLECYSTECTOMY  1985   ESOPHAGOGASTRODUODENOSCOPY (EGD) WITH PROPOFOL N/A 10/14/2022   Procedure: ESOPHAGOGASTRODUODENOSCOPY (EGD) WITH PROPOFOL;  Surgeon: Lemar Lofty., MD;  Location: [facility];  Service: Gastroenterology;  Laterality: N/A;   EUS N/A 10/14/2022   Procedure: UPPER ENDOSCOPIC ULTRASOUND (EUS) RADIAL;  Surgeon: Lemar Lofty., MD;  Location: [facility];  Service: Gastroenterology;  Laterality: N/A;   EXCISIONAL HEMORRHOIDECTOMY     "dr cut out in his office" (02/08/2012)   INCISIONAL BREAST BIOPSY  2000   right   JOINT REPLACEMENT     rt knee   KNEE ARTHROSCOPY  08/2009   right   KNEE ARTHROSCOPY WITH MEDIAL MENISECTOMY     left   Left Cystoscopy   1990   LEFT OOPHORECTOMY  1980   Lithotripsy (L) Kidney  1997   LUMBAR LAMINECTOMY/DECOMPRESSION MICRODISCECTOMY Left 07/01/2016   Procedure: Laminectomy for synovial cyst - left - Lumbar four-five;  Surgeon: Julio Sicks, MD;  Location: [facility] OR;  Service: Neurosurgery;  Laterality: Left;   PARTIAL MASTECTOMY WITH NEEDLE LOCALIZATION Left 09/01/2012   Procedure: PARTIAL MASTECTOMY WITH NEEDLE LOCALIZATION;  Surgeon: Ernestene Mention, MD;  Location: [facility] OR;  Service: General;  Laterality: Left;   Percitania stone removed (L) Kidney  1992   REVERSE SHOULDER ARTHROPLASTY Right 02/12/2021   Procedure: REVERSE SHOULDER ARTHROPLASTY;  Surgeon: Jones Broom, MD;  Location: [facility];  Service: Orthopedics;  Laterality: Right;   Right nasal surgery  08/1988   Right sinus removed  08/1989   tooth partial   ROTATOR CUFF REPAIR  2013   right shoulder x 2   SHOULDER ARTHROSCOPY WITH ROTATOR CUFF REPAIR AND SUBACROMIAL DECOMPRESSION Left 08/20/2013   Procedure: SHOULDER ARTHROSCOPY  AND SUBACROMIAL DECOMPRESSION;  Surgeon: Mable Paris, MD;  Location: [facility];  Service: Orthopedics;  Laterality: Left;  Left shoulder arthroscopy, debridement, subacromial decompression, distal clavical  resection   THYROIDECTOMY  04/22/2011   Procedure: THYROIDECTOMY;  Surgeon: Antony Contras, MD;  Location: [facility];  Service: ENT;  Laterality: N/A;  TOTAL THYROIDECOTMY   TOTAL KNEE ARTHROPLASTY  06/18/2011   Procedure: TOTAL KNEE ARTHROPLASTY;  Surgeon: Nestor Lewandowsky, MD;  Location: [facility];  Service: Orthopedics;  Laterality: Left;  DEPUY   TOTAL KNEE ARTHROPLASTY  02/07/2012   Procedure: TOTAL KNEE ARTHROPLASTY;  Surgeon: Nestor Lewandowsky, MD;  Location: [facility];  Service: Orthopedics;  Laterality: Right;   TUBAL LIGATION  1972    SOCIAL HISTORY: Social History   Socioeconomic History   Marital status: Married    Spouse name: Not on file   Number of children: 2   Years of education: 14   Highest education level: Not on file  Occupational History   Occupation: disability    Employer: RETIRED  Tobacco Use   Smoking status: Former    Current packs/day: 0.00    Average packs/day: 1 pack/day for 6.0 years (6.0 ttl pk-yrs)    Types: Cigarettes    Start date: 04/05/1977    Quit date: 04/06/1983    Years since quitting: 39.7   Smokeless tobacco:  Never   Tobacco comments:    Married, lives with spouse. Disable- 2 grown kids-6 g-kids  Vaping Use   Vaping status: Never Used  Substance and Sexual Activity   Alcohol use: No    Comment: none   Drug use: No   Sexual activity: Not Currently  Other Topics Concern   Not on file  Social History Narrative   Married, lives with spouse - 2 adult children   Disabled -- started around 2000 for mental health reasons, Bipolar Disorder/PTSD   Husband works   International aid/development worker of [employer] investment [employer] Strain: Low Risk  (09/05/2019)   Received from [employer], [facility]   Overall Financial Resource Strain (CARDIA)    Difficulty of Paying Living Expenses: Not very hard  Food Insecurity: No Food Insecurity (09/05/2019)   Received from [facility], [facility]   Hunger Vital Sign    Worried About Running Out of Food in the Last Year:  Never true    Ran Out of Food in the Last Year: Never true  Transportation Needs: No Transportation Needs (09/05/2019)   Received from [facility], [facility]   PRAPARE - Transportation    Lack of Transportation (Medical): No    Lack of Transportation (Non-Medical): No  Physical Activity: Unknown (09/05/2019)   Received from [facility], [facility]   Exercise Vital Sign    Days of Exercise per Week: 0 days    Minutes of Exercise per Session: Not on file  Stress: No Stress Concern Present (09/05/2019)   Received from [facility], [facility] of Occupational Health - Occupational Stress Questionnaire    Feeling of Stress : Only a little  Social Connections: Unknown (08/16/2021)   Received from [facility], [facility]   Social Network    Social Network: Not on file  Intimate Partner Violence: Unknown (07/08/2021)   Received from [facility], [facility]   HITS    Physically Hurt: Not on file    Insult or Talk Down To: Not on file    Threaten Physical Harm: Not on file    Scream or Curse: Not on file    FAMILY HISTORY: Family History  Problem Relation Age of Onset   Hypertension Mother    Lung cancer Mother        non-smoker   Diabetes Father    Hypertension Father    Hyperlipidemia Father    Hypertension Sister    Hypertension Sister    Hypertension Sister    Hypertension Brother    Hypertension Brother    Hypertension Brother    Stomach cancer Maternal Grandmother    Diabetes Daughter    Heart attack Other    Coronary artery disease Other    Colon cancer Neg Hx        unsure   Esophageal cancer Neg Hx    Liver cancer Neg Hx    Pancreatic cancer Neg Hx    Rectal cancer Neg Hx    Colon polyps Neg Hx     ALLERGIES:  has No Known Allergies.  MEDICATIONS:  Current Outpatient Medications  Medication Sig Dispense Refill   allopurinol (ZYLOPRIM) 300 MG tablet Take 1 tablet (300 mg total) by mouth daily. 90 tablet 3   anastrozole  (ARIMIDEX) 1 MG tablet Take 1 tablet (1 mg) by mouth daily. 90 tablet 3   aspirin EC 81 MG tablet Take 1 tablet (81 mg total) by mouth daily. Swallow whole. 90 tablet  3   Biotin 19147 MCG TABS Take 10,000 mcg by mouth at bedtime.     BLACK ELDERBERRY,BERRY-FLOWER, PO Take 1 capsule by mouth in the morning.     cetirizine (ZYRTEC) 10 MG tablet Take 20 mg by mouth in the morning.     Cholecalciferol (VITAMIN D3) 125 MCG (5000 UT) CAPS Take 5,000 Units by mouth at bedtime.     CINNAMON PO Take 2,000 mg by mouth in the morning.     Cranberry 500 MG TABS Take 1,000 mg by mouth in the morning.     cyclobenzaprine (FLEXERIL) 10 MG tablet Take 1 tablet (10 mg) by mouth at bedtime as needed for muscle spasms. 90 tablet 3   doxepin (SINEQUAN) 10 MG capsule Take 1 capsule (10 mg total) by mouth daily. 90 capsule 1   ferrous sulfate 325 (65 FE) MG tablet Take 325 mg by mouth in the morning.     fluticasone (FLONASE) 50 MCG/ACT nasal spray Place 2 sprays into both nostrils daily. (Patient taking differently: Place 2 sprays into both nostrils daily as needed (allergies.).) 48 g 2   folic acid (FOLVITE) 1 MG tablet Take 2 mg by mouth in the morning.     furosemide (LASIX) 40 MG tablet Take 1 tablet (40 mg total) by mouth daily. 90 tablet 10   glucose blood (TRUE METRIX BLOOD GLUCOSE TEST) test strip TEST BLOOD SUGAR EVERY DAY AND AS NEEDED 200 strip 2   Homeopathic Products (EARACHE DROPS OT) Place 1-2 drops in ear(s) 3 (three) times daily as needed (ear pain.).     insulin degludec (TRESIBA FLEXTOUCH) 100 UNIT/ML FlexTouch Pen Inject 26 Units into the skin daily. (Patient taking differently: Inject 23 Units into the skin in the morning.) 30 mL 2   Insulin Pen Needle (DROPLET PEN NEEDLES) 31G X 5 MM MISC USE TO INJECT INSULIN DAILY 100 each 3   lamoTRIgine (LAMICTAL) 200 MG tablet TAKE 1 TABLET (200 MG TOTAL) BY MOUTH DAILY AT 12 NOON. 90 tablet 10   levothyroxine (SYNTHROID) 50 MCG tablet Take 1 tablet (50 mcg  total) by mouth daily. 90 tablet 3   lisinopril (ZESTRIL) 5 MG tablet Take 1 tablet (5 mg total) by mouth daily. 90 tablet 10   magnesium oxide (MAG-OX) 400 (240 Mg) MG tablet Take 400 mg by mouth in the morning.     metFORMIN (GLUCOPHAGE) 500 MG tablet Take 2 tablets (1,000 mg total) by mouth every morning AND 1 tablet (500 mg total) every evening. 270 tablet 1   omeprazole (PRILOSEC) 20 MG capsule TAKE 1 CAPSULE (20 MG TOTAL) BY MOUTH DAILY. TAKE 30 MINUTES BEFORE A MEAL 90 capsule 10   ondansetron (ZOFRAN-ODT) 4 MG disintegrating tablet Take 1 tablet (4 mg total) by mouth every 4 (four) hours as needed for nausea/vomiting 30 tablet 3   oxybutynin (DITROPAN-XL) 10 MG 24 hr tablet Take 1 tablet (10 mg total) by mouth at bedtime. 90 tablet 3   Polyethyl Glycol-Propyl Glycol (SYSTANE ULTRA) 0.4-0.3 % SOLN Place 1-2 drops into both eyes 3 (three) times daily as needed (dry/irritated eyes).     potassium chloride SA (KLOR-CON M) 20 MEQ tablet Take 1 tablet (20 mEq total) by mouth daily. (Patient taking differently: Take 20 mEq by mouth daily with supper.) 90 tablet 3   pravastatin (PRAVACHOL) 20 MG tablet Take 1 tablet (20 mg total) by mouth at bedtime. 90 tablet 2   predniSONE (DELTASONE) 5 MG tablet Take 1 tablet (5 mg total) by  mouth daily as directed 90 tablet 9   sertraline (ZOLOFT) 100 MG tablet Take 1 tablet (100 mg total) by mouth at bedtime. 90 tablet 1   sulfaSALAzine (AZULFIDINE) 500 MG tablet Take 2 tablets by mouth 2 times daily. 360 tablet 1   tamsulosin (FLOMAX) 0.4 MG CAPS capsule Take 1 capsule by mouth daily. (Patient taking differently: Take 0.4 mg by mouth at bedtime.) 90 capsule 3   No current facility-administered medications for this visit.    REVIEW OF SYSTEMS:   Constitutional: Denies fevers, chills or abnormal night sweats All other systems were reviewed with the patient and are negative.  PHYSICAL EXAMINATION: ECOG PERFORMANCE STATUS: 1 - Symptomatic but completely  ambulatory  Vitals:   12/28/22 1542  BP: (!) 159/54  Pulse: 75  Resp: 18  Temp: (!) 97.3 F (36.3 C)  SpO2: 99%   Filed Weights   12/28/22 1542  Weight: 218 lb 9.6 oz (99.2 kg)    GENERAL:alert, no distress and comfortable  LABORATORY DATA:  I have reviewed the data as listed Lab Results  Component Value Date   WBC 13.8 (H) 12/10/2022   HGB 12.1 12/10/2022   HCT 37.1 12/10/2022   MCV 83.4 12/10/2022   PLT 484 (H) 12/10/2022   Lab Results  Component Value Date   NA 136 12/10/2022   K 4.2 12/10/2022   CL 94 (L) 12/10/2022   CO2 29 12/10/2022    RADIOGRAPHIC STUDIES: I have personally reviewed the radiological reports and agreed with the findings in the report.  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  He is tolerating oral iron and has remarkable improvement in her labs.  No further requirement for IV iron at this time.  She could be followed every 3 to 6 months with labs.  Leukocytosis: Primarily elevated neutrophils: Possibly related to underlying inflammation from her history of psoriatic arthritis.  No additional workup will be necessary for this either.  Patient will continue to get her mammograms every year and will follow with her primary care doctor.  We are available to see her on an as-needed basis.   All questions were answered. The patient knows to call the clinic with any problems, questions or concerns.    Tamsen Meek, MD 12/28/22

## 2022-12-30 ENCOUNTER — Other Ambulatory Visit: Payer: Self-pay | Admitting: Physician Assistant

## 2022-12-30 ENCOUNTER — Other Ambulatory Visit: Payer: Self-pay

## 2022-12-30 ENCOUNTER — Other Ambulatory Visit (HOSPITAL_COMMUNITY): Payer: Self-pay

## 2022-12-30 MED ORDER — TRUE METRIX BLOOD GLUCOSE TEST VI STRP
ORAL_STRIP | 4 refills | Status: DC
  Filled 2022-12-30 – 2023-06-05 (×3): qty 200, 90d supply, fill #0
  Filled 2023-06-10: qty 200, 100d supply, fill #0

## 2022-12-30 MED FILL — Insulin Degludec Soln Pen-Injector 100 Unit/ML: SUBCUTANEOUS | 115 days supply | Qty: 30 | Fill #2 | Status: AC

## 2023-01-01 NOTE — Progress Notes (Signed)
Seen by casting department

## 2023-01-03 ENCOUNTER — Other Ambulatory Visit (HOSPITAL_COMMUNITY): Payer: Self-pay

## 2023-01-10 DIAGNOSIS — M47816 Spondylosis without myelopathy or radiculopathy, lumbar region: Secondary | ICD-10-CM | POA: Diagnosis not present

## 2023-01-21 ENCOUNTER — Other Ambulatory Visit (HOSPITAL_COMMUNITY): Payer: Self-pay

## 2023-02-03 DIAGNOSIS — L4050 Arthropathic psoriasis, unspecified: Secondary | ICD-10-CM | POA: Diagnosis not present

## 2023-02-03 DIAGNOSIS — L603 Nail dystrophy: Secondary | ICD-10-CM | POA: Diagnosis not present

## 2023-02-03 DIAGNOSIS — M1990 Unspecified osteoarthritis, unspecified site: Secondary | ICD-10-CM | POA: Diagnosis not present

## 2023-02-03 DIAGNOSIS — M797 Fibromyalgia: Secondary | ICD-10-CM | POA: Diagnosis not present

## 2023-02-03 DIAGNOSIS — Z79899 Other long term (current) drug therapy: Secondary | ICD-10-CM | POA: Diagnosis not present

## 2023-02-03 DIAGNOSIS — G629 Polyneuropathy, unspecified: Secondary | ICD-10-CM | POA: Diagnosis not present

## 2023-02-03 DIAGNOSIS — R768 Other specified abnormal immunological findings in serum: Secondary | ICD-10-CM | POA: Diagnosis not present

## 2023-02-03 DIAGNOSIS — M109 Gout, unspecified: Secondary | ICD-10-CM | POA: Diagnosis not present

## 2023-02-06 ENCOUNTER — Other Ambulatory Visit: Payer: Self-pay | Admitting: Family Medicine

## 2023-02-06 DIAGNOSIS — J019 Acute sinusitis, unspecified: Secondary | ICD-10-CM

## 2023-02-06 DIAGNOSIS — H9203 Otalgia, bilateral: Secondary | ICD-10-CM

## 2023-02-07 ENCOUNTER — Other Ambulatory Visit: Payer: Self-pay

## 2023-02-07 ENCOUNTER — Other Ambulatory Visit (HOSPITAL_COMMUNITY): Payer: Self-pay

## 2023-02-07 MED ORDER — LEFLUNOMIDE 20 MG PO TABS
20.0000 mg | ORAL_TABLET | Freq: Every day | ORAL | 3 refills | Status: DC
  Filled 2023-02-07: qty 30, 30d supply, fill #0
  Filled 2023-02-16 – 2023-03-04 (×3): qty 30, 30d supply, fill #1
  Filled 2023-04-19: qty 30, 30d supply, fill #2

## 2023-02-07 MED ORDER — FLUTICASONE PROPIONATE 50 MCG/ACT NA SUSP
2.0000 | Freq: Every day | NASAL | 2 refills | Status: DC
Start: 2023-02-07 — End: 2023-11-28
  Filled 2023-02-07: qty 48, 90d supply, fill #0
  Filled 2023-05-07: qty 48, 90d supply, fill #1
  Filled 2023-08-31: qty 48, 90d supply, fill #2

## 2023-02-07 NOTE — Telephone Encounter (Signed)
Last refill on 10/23/202

## 2023-02-08 ENCOUNTER — Other Ambulatory Visit: Payer: Self-pay | Admitting: Physician Assistant

## 2023-02-08 ENCOUNTER — Other Ambulatory Visit (HOSPITAL_COMMUNITY): Payer: Self-pay

## 2023-02-08 ENCOUNTER — Other Ambulatory Visit: Payer: Self-pay

## 2023-02-08 MED ORDER — FUROSEMIDE 40 MG PO TABS
40.0000 mg | ORAL_TABLET | Freq: Every day | ORAL | 10 refills | Status: DC
  Filled 2023-02-08: qty 90, 90d supply, fill #0
  Filled 2023-05-07: qty 90, 90d supply, fill #1
  Filled 2023-08-31: qty 90, 90d supply, fill #2
  Filled 2024-01-26: qty 90, 90d supply, fill #3

## 2023-02-08 MED ORDER — LAMOTRIGINE 200 MG PO TABS
200.0000 mg | ORAL_TABLET | Freq: Every day | ORAL | 10 refills | Status: DC
  Filled 2023-02-08: qty 90, 90d supply, fill #0
  Filled 2023-05-07: qty 90, 90d supply, fill #1
  Filled 2023-08-31: qty 90, 90d supply, fill #2
  Filled 2024-01-26: qty 90, 90d supply, fill #3

## 2023-02-08 MED ORDER — LISINOPRIL 5 MG PO TABS
5.0000 mg | ORAL_TABLET | Freq: Every day | ORAL | 10 refills | Status: DC
  Filled 2023-02-08: qty 90, 90d supply, fill #0
  Filled 2023-05-07: qty 90, 90d supply, fill #1
  Filled 2023-08-31: qty 90, 90d supply, fill #2
  Filled 2024-01-26: qty 90, 90d supply, fill #3

## 2023-02-08 MED ORDER — OMEPRAZOLE 20 MG PO CPDR
20.0000 mg | DELAYED_RELEASE_CAPSULE | Freq: Every day | ORAL | 10 refills | Status: DC
  Filled 2023-02-08: qty 90, 90d supply, fill #0
  Filled 2023-05-07: qty 90, 90d supply, fill #1
  Filled 2023-08-31: qty 90, 90d supply, fill #2
  Filled 2024-01-26: qty 90, 90d supply, fill #3

## 2023-02-11 ENCOUNTER — Other Ambulatory Visit (HOSPITAL_COMMUNITY): Payer: Self-pay

## 2023-02-14 ENCOUNTER — Other Ambulatory Visit (HOSPITAL_COMMUNITY): Payer: Self-pay

## 2023-02-14 MED ORDER — SULFASALAZINE 500 MG PO TABS
1000.0000 mg | ORAL_TABLET | Freq: Two times a day (BID) | ORAL | 1 refills | Status: DC
  Filled 2023-02-14: qty 360, 90d supply, fill #0
  Filled 2023-06-05: qty 360, 90d supply, fill #1

## 2023-02-16 ENCOUNTER — Other Ambulatory Visit (HOSPITAL_COMMUNITY): Payer: Self-pay

## 2023-02-17 ENCOUNTER — Other Ambulatory Visit (HOSPITAL_COMMUNITY): Payer: Self-pay

## 2023-02-17 DIAGNOSIS — M47816 Spondylosis without myelopathy or radiculopathy, lumbar region: Secondary | ICD-10-CM | POA: Diagnosis not present

## 2023-03-04 ENCOUNTER — Other Ambulatory Visit (HOSPITAL_COMMUNITY): Payer: Self-pay

## 2023-03-05 ENCOUNTER — Other Ambulatory Visit (HOSPITAL_COMMUNITY): Payer: Self-pay

## 2023-03-11 ENCOUNTER — Ambulatory Visit: Payer: HMO | Admitting: Family Medicine

## 2023-03-16 ENCOUNTER — Ambulatory Visit: Payer: HMO | Admitting: Family Medicine

## 2023-03-25 ENCOUNTER — Ambulatory Visit: Payer: HMO | Admitting: Family Medicine

## 2023-03-29 ENCOUNTER — Other Ambulatory Visit: Payer: Self-pay

## 2023-03-29 ENCOUNTER — Other Ambulatory Visit (HOSPITAL_COMMUNITY): Payer: Self-pay

## 2023-03-31 ENCOUNTER — Other Ambulatory Visit: Payer: Self-pay

## 2023-04-01 ENCOUNTER — Other Ambulatory Visit (HOSPITAL_COMMUNITY): Payer: Self-pay

## 2023-04-08 ENCOUNTER — Other Ambulatory Visit (HOSPITAL_COMMUNITY): Payer: Self-pay

## 2023-04-19 ENCOUNTER — Other Ambulatory Visit: Payer: Self-pay | Admitting: Family Medicine

## 2023-04-20 ENCOUNTER — Other Ambulatory Visit (HOSPITAL_COMMUNITY): Payer: Self-pay

## 2023-04-20 ENCOUNTER — Other Ambulatory Visit: Payer: Self-pay

## 2023-04-20 MED ORDER — FOLIC ACID 1 MG PO TABS
2.0000 mg | ORAL_TABLET | Freq: Every morning | ORAL | 0 refills | Status: DC
  Filled 2023-04-20 (×2): qty 60, 30d supply, fill #0

## 2023-04-26 DIAGNOSIS — M47816 Spondylosis without myelopathy or radiculopathy, lumbar region: Secondary | ICD-10-CM | POA: Diagnosis not present

## 2023-05-02 DIAGNOSIS — M19211 Secondary osteoarthritis, right shoulder: Secondary | ICD-10-CM | POA: Diagnosis not present

## 2023-05-02 DIAGNOSIS — M19212 Secondary osteoarthritis, left shoulder: Secondary | ICD-10-CM | POA: Diagnosis not present

## 2023-05-05 ENCOUNTER — Other Ambulatory Visit: Payer: Self-pay | Admitting: Family Medicine

## 2023-05-05 ENCOUNTER — Other Ambulatory Visit (HOSPITAL_COMMUNITY): Payer: Self-pay

## 2023-05-05 DIAGNOSIS — G629 Polyneuropathy, unspecified: Secondary | ICD-10-CM | POA: Diagnosis not present

## 2023-05-05 DIAGNOSIS — L603 Nail dystrophy: Secondary | ICD-10-CM | POA: Diagnosis not present

## 2023-05-05 DIAGNOSIS — M109 Gout, unspecified: Secondary | ICD-10-CM | POA: Diagnosis not present

## 2023-05-05 DIAGNOSIS — R768 Other specified abnormal immunological findings in serum: Secondary | ICD-10-CM | POA: Diagnosis not present

## 2023-05-05 DIAGNOSIS — L4050 Arthropathic psoriasis, unspecified: Secondary | ICD-10-CM | POA: Diagnosis not present

## 2023-05-05 DIAGNOSIS — Z79899 Other long term (current) drug therapy: Secondary | ICD-10-CM | POA: Diagnosis not present

## 2023-05-05 DIAGNOSIS — M1990 Unspecified osteoarthritis, unspecified site: Secondary | ICD-10-CM | POA: Diagnosis not present

## 2023-05-05 DIAGNOSIS — M797 Fibromyalgia: Secondary | ICD-10-CM | POA: Diagnosis not present

## 2023-05-05 MED ORDER — METFORMIN HCL 500 MG PO TABS
ORAL_TABLET | ORAL | 0 refills | Status: DC
  Filled 2023-05-05: qty 270, 90d supply, fill #0

## 2023-05-06 ENCOUNTER — Encounter: Payer: Self-pay | Admitting: Family Medicine

## 2023-05-06 ENCOUNTER — Other Ambulatory Visit: Payer: Self-pay

## 2023-05-06 ENCOUNTER — Other Ambulatory Visit: Payer: Self-pay | Admitting: Family Medicine

## 2023-05-06 ENCOUNTER — Other Ambulatory Visit (HOSPITAL_COMMUNITY): Payer: Self-pay

## 2023-05-06 ENCOUNTER — Ambulatory Visit (INDEPENDENT_AMBULATORY_CARE_PROVIDER_SITE_OTHER): Payer: HMO | Admitting: Family Medicine

## 2023-05-06 VITALS — BP 137/76 | HR 86 | Temp 97.8°F | Ht 61.0 in | Wt 213.0 lb

## 2023-05-06 DIAGNOSIS — D500 Iron deficiency anemia secondary to blood loss (chronic): Secondary | ICD-10-CM

## 2023-05-06 DIAGNOSIS — E7800 Pure hypercholesterolemia, unspecified: Secondary | ICD-10-CM

## 2023-05-06 DIAGNOSIS — M1A9XX1 Chronic gout, unspecified, with tophus (tophi): Secondary | ICD-10-CM

## 2023-05-06 DIAGNOSIS — E1159 Type 2 diabetes mellitus with other circulatory complications: Secondary | ICD-10-CM | POA: Diagnosis not present

## 2023-05-06 DIAGNOSIS — Z1231 Encounter for screening mammogram for malignant neoplasm of breast: Secondary | ICD-10-CM

## 2023-05-06 DIAGNOSIS — Z794 Long term (current) use of insulin: Secondary | ICD-10-CM

## 2023-05-06 DIAGNOSIS — I10 Essential (primary) hypertension: Secondary | ICD-10-CM

## 2023-05-06 DIAGNOSIS — E890 Postprocedural hypothyroidism: Secondary | ICD-10-CM | POA: Diagnosis not present

## 2023-05-06 DIAGNOSIS — E1165 Type 2 diabetes mellitus with hyperglycemia: Secondary | ICD-10-CM | POA: Diagnosis not present

## 2023-05-06 DIAGNOSIS — E559 Vitamin D deficiency, unspecified: Secondary | ICD-10-CM

## 2023-05-06 LAB — POCT GLYCOSYLATED HEMOGLOBIN (HGB A1C): Hemoglobin A1C: 6.9 % — AB (ref 4.0–5.6)

## 2023-05-06 MED ORDER — SERTRALINE HCL 100 MG PO TABS
100.0000 mg | ORAL_TABLET | Freq: Every day | ORAL | 1 refills | Status: DC
  Filled 2023-05-07: qty 90, 90d supply, fill #0
  Filled 2023-08-03: qty 90, 90d supply, fill #1

## 2023-05-06 NOTE — Progress Notes (Unsigned)
Subjective:     Patient ID: Brittany Acosta, female    DOB: December 19, 1950, 73 y.o.   MRN: [id number]  Chief Complaint  Patient presents with   Anxiety    Pt here for f.u w/o any concerns   Depression   Diabetes   Gastroesophageal Reflux   Hyperlipidemia   Hypertension    HPI - here with husband.   HTN - Pt is on lisinopril 5 mg. States she had a steroid injection this Wednesday, which may contribute to her elevated Bp today. No new ha/dizziness/cp/palp/edema/cough/sob.   Headaches - She states she has constant headaches, which is normal for her.   Moods - Takes Zoloft 100 mg and doxepin 10 mg at nighttime. No SI.   Breast cancer - Hx of breat cancer in left breast. She has followed up at the [facility] in the past but not for years. She reports she didn't have chemo, had radiation. On anastrozole.   IDA-Takes her iron supplements daily. Had GI w/u.  No source.     History of Present Illness   The patient presents with elevated blood sugar levels and shoulder pain. She is accompanied by her wife.  She is experiencing significant left shoulder pain due to arthritis, with x-rays showing bone-on-bone contact and two large bone spurs. The pain is severe enough to wake her at night and cause emotional distress. going to have a L reverse shoulder replacement by Dr. Ave Filter and needs Clearance.  will be seeing Card as well.   She has elevated blood sugar levels, with readings between 150 and 170 mg/dL, higher than her usual range of 100 to 125 mg/dL. She attributes this to increased stress and pain from her shoulder, as well as dietary changes. She is currently taking 1000 mg of metformin in the morning and 500 mg in the evening, along with 23 units of insulin. Her blood sugar control has been poor recently. can't exercise d/t pain and diet not good.  She experienced severe headaches and diarrhea after starting a new medication prescribed by her arthritis doctor in late  November or December, which she subsequently discontinued after identifying these as side effects.  Her mood is stable, with no thoughts of suicide, and her night sweats have decreased recently. She continues to take iron supplements daily. She is cautious about walking due to fear of falling, as she has issues with her feet. 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 She has a device for exercising her legs but lacks a comfortable chair to use it effectively. No chest pain, shortness of breath, or new headaches. Her blood pressure and mood are stable, and night sweats have decreased.       Health Maintenance Due  Topic Date Due   Medicare Annual Wellness (AWV)  09/04/2020   COVID-19 Vaccine (4 - 2024-25 season) 12/05/2022    Past Medical History:  Diagnosis Date   Allergic rhinitis, cause unspecified    Arthritis    Bipolar disorder, unspecified (HCC)    depression   Cancer (HCC)    skin cancer on left leg, left breast cancer   Cataract    Chronic diastolic heart failure (HCC)    Depression    GERD (gastroesophageal reflux disease)    Headache    History of gout    "haven't had it in several years" (02/08/2012)   History of kidney stones    History of wrist fracture    rt  wrist   Hypertension    Iron deficiency anemia    Kidney stones    sees urologist @ [facility]   Morbid obesity (HCC)    Neuropathy due to secondary diabetes (HCC)    Nontoxic multinodular goiter    Osteoporosis    Personal history of radiation therapy    Pneumonia    PONV (postoperative nausea and vomiting)    Psoriatic arthritis (HCC)    PTSD (post-traumatic stress disorder)    Pure hypercholesterolemia    RBBB    Scleroderma (HCC)    Sleep apnea 10/2012   mild osa-did not need cpap -dr clance   Type II diabetes mellitus (HCC)    Unspecified hypothyroidism     Past Surgical History:  Procedure Laterality Date   ABDOMINAL HYSTERECTOMY  1976   BREAST EXCISIONAL BIOPSY Left    BREAST  LUMPECTOMY Left 04/25/2018   BREAST LUMPECTOMY WITH RADIOACTIVE SEED AND SENTINEL LYMPH NODE BIOPSY Left 04/25/2018   Procedure: LEFT BREAST LUMPECTOMY WITH RADIOACTIVE SEED AND SENTINEL LYMPH NODE BIOPSY;  Surgeon: Glenna Fellows, MD;  Location: [facility];  Service: General;  Laterality: Left;   BREAST SURGERY     CARDIAC CATHETERIZATION  2001   sees Dr Peter [place]   CATARACT EXTRACTION  2014   CATARACT EXTRACTION, BILATERAL  02/2011   epps   CHOLECYSTECTOMY  1985   ESOPHAGOGASTRODUODENOSCOPY (EGD) WITH PROPOFOL N/A 10/14/2022   Procedure: ESOPHAGOGASTRODUODENOSCOPY (EGD) WITH PROPOFOL;  Surgeon: Lemar Lofty., MD;  Location: [facility];  Service: Gastroenterology;  Laterality: N/A;   EUS N/A 10/14/2022   Procedure: UPPER ENDOSCOPIC ULTRASOUND (EUS) RADIAL;  Surgeon: Lemar Lofty., MD;  Location: [facility];  Service: Gastroenterology;  Laterality: N/A;   EXCISIONAL HEMORRHOIDECTOMY     "dr cut out in his office" (02/08/2012)   INCISIONAL BREAST BIOPSY  2000   right   JOINT REPLACEMENT     rt knee   KNEE ARTHROSCOPY  08/2009   right   KNEE ARTHROSCOPY WITH MEDIAL MENISECTOMY     left   Left Cystoscopy   1990   LEFT OOPHORECTOMY  1980   Lithotripsy (L) Kidney  1997   LUMBAR LAMINECTOMY/DECOMPRESSION MICRODISCECTOMY Left 07/01/2016   Procedure: Laminectomy for synovial cyst - left - Lumbar four-five;  Surgeon: Julio Sicks, MD;  Location: [facility] OR;  Service: Neurosurgery;  Laterality: Left;   PARTIAL MASTECTOMY WITH NEEDLE LOCALIZATION Left 09/01/2012   Procedure: PARTIAL MASTECTOMY WITH NEEDLE LOCALIZATION;  Surgeon: Ernestene Mention, MD;  Location: [facility];  Service: General;  Laterality: Left;   Percitania stone removed (L) Kidney  1992   REVERSE SHOULDER ARTHROPLASTY Right 02/12/2021   Procedure: REVERSE SHOULDER ARTHROPLASTY;  Surgeon: Jones Broom, MD;  Location: [facility];  Service: Orthopedics;  Laterality: Right;   Right nasal surgery  08/1988   Right sinus  removed  08/1989   tooth partial   ROTATOR CUFF REPAIR  2013   right shoulder x 2   SHOULDER ARTHROSCOPY WITH ROTATOR CUFF REPAIR AND SUBACROMIAL DECOMPRESSION Left 08/20/2013   Procedure: SHOULDER ARTHROSCOPY  AND SUBACROMIAL DECOMPRESSION;  Surgeon: Mable Paris, MD;  Location: [facility];  Service: Orthopedics;  Laterality: Left;  Left shoulder arthroscopy, debridement, subacromial decompression, distal clavical resection   THYROIDECTOMY  04/22/2011   Procedure: THYROIDECTOMY;  Surgeon: Antony Contras, MD;  Location: [facility] OR;  Service: ENT;  Laterality: N/A;  TOTAL THYROIDECOTMY   TOTAL KNEE ARTHROPLASTY  06/18/2011   Procedure: TOTAL KNEE ARTHROPLASTY;  Surgeon: Feliberto Gottron  Turner Daniels, MD;  Location: [facility];  Service: Orthopedics;  Laterality: Left;  DEPUY   TOTAL KNEE ARTHROPLASTY  02/07/2012   Procedure: TOTAL KNEE ARTHROPLASTY;  Surgeon: Nestor Lewandowsky, MD;  Location: [facility];  Service: Orthopedics;  Laterality: Right;   TUBAL LIGATION  1972     Current Outpatient Medications:    allopurinol (ZYLOPRIM) 300 MG tablet, Take 1 tablet (300 mg total) by mouth daily., Disp: 90 tablet, Rfl: 3   anastrozole (ARIMIDEX) 1 MG tablet, Take 1 tablet (1 mg) by mouth daily., Disp: 90 tablet, Rfl: 3   aspirin EC 81 MG tablet, Take 1 tablet (81 mg total) by mouth daily. Swallow whole., Disp: 90 tablet, Rfl: 3   Biotin 95284 MCG TABS, Take 10,000 mcg by mouth at bedtime., Disp: , Rfl:    BLACK ELDERBERRY,BERRY-FLOWER, PO, Take 1 capsule by mouth in the morning., Disp: , Rfl:    cetirizine (ZYRTEC) 10 MG tablet, Take 20 mg by mouth in the morning., Disp: , Rfl:    Cholecalciferol (VITAMIN D3) 125 MCG (5000 UT) CAPS, Take 5,000 Units by mouth at bedtime., Disp: , Rfl:    CINNAMON PO, Take 2,000 mg by mouth in the morning., Disp: , Rfl:    Cranberry 500 MG TABS, Take 1,000 mg by mouth in the morning., Disp: , Rfl:    cyclobenzaprine (FLEXERIL) 10 MG tablet, Take 1 tablet (10 mg) by mouth at  bedtime as needed for muscle spasms., Disp: 90 tablet, Rfl: 3   doxepin (SINEQUAN) 10 MG capsule, Take 1 capsule (10 mg total) by mouth daily., Disp: 90 capsule, Rfl: 1   ferrous sulfate 325 (65 FE) MG tablet, Take 325 mg by mouth in the morning., Disp: , Rfl:    fluticasone (FLONASE) 50 MCG/ACT nasal spray, Place 2 sprays into both nostrils daily., Disp: 48 g, Rfl: 2   folic acid (FOLVITE) 1 MG tablet, Take 2 tablets (2 mg total) by mouth in the morning., Disp: 60 tablet, Rfl: 0   furosemide (LASIX) 40 MG tablet, Take 1 tablet (40 mg total) by mouth daily., Disp: 90 tablet, Rfl: 10   glucose blood (TRUE METRIX BLOOD GLUCOSE TEST) test strip, TEST BLOOD SUGAR EVERY DAY AND AS NEEDED, Disp: 200 strip, Rfl: 4   Homeopathic Products (EARACHE DROPS OT), Place 1-2 drops in ear(s) 3 (three) times daily as needed (ear pain.)., Disp: , Rfl:    insulin degludec (TRESIBA FLEXTOUCH) 100 UNIT/ML FlexTouch Pen, Inject 26 Units into the skin daily. (Patient taking differently: Inject 23 Units into the skin in the morning.), Disp: 30 mL, Rfl: 2   Insulin Pen Needle (DROPLET PEN NEEDLES) 31G X 5 MM MISC, USE TO INJECT INSULIN DAILY, Disp: 100 each, Rfl: 3   lamoTRIgine (LAMICTAL) 200 MG tablet, TAKE 1 TABLET (200 MG TOTAL) BY MOUTH DAILY AT 12 NOON., Disp: 90 tablet, Rfl: 10   leflunomide (ARAVA) 20 MG tablet, Take 1 tablet (20 mg total) by mouth daily., Disp: 30 tablet, Rfl: 3   levothyroxine (SYNTHROID) 50 MCG tablet, Take 1 tablet (50 mcg total) by mouth daily., Disp: 90 tablet, Rfl: 3   lisinopril (ZESTRIL) 5 MG tablet, Take 1 tablet (5 mg total) by mouth daily., Disp: 90 tablet, Rfl: 10   magnesium oxide (MAG-OX) 400 (240 Mg) MG tablet, Take 400 mg by mouth in the morning., Disp: , Rfl:    metFORMIN (GLUCOPHAGE) 500 MG tablet, Take 2 tablets (1,000 mg total) by mouth every morning AND 1 tablet (500 mg total)  every evening., Disp: 270 tablet, Rfl: 0   omeprazole (PRILOSEC) 20 MG capsule, TAKE 1 CAPSULE (20 MG  TOTAL) BY MOUTH DAILY. TAKE 30 MINUTES BEFORE A MEAL, Disp: 90 capsule, Rfl: 10   ondansetron (ZOFRAN-ODT) 4 MG disintegrating tablet, Take 1 tablet (4 mg total) by mouth every 4 (four) hours as needed for nausea/vomiting, Disp: 30 tablet, Rfl: 3   oxybutynin (DITROPAN-XL) 10 MG 24 hr tablet, Take 1 tablet (10 mg total) by mouth at bedtime., Disp: 90 tablet, Rfl: 3   Polyethyl Glycol-Propyl Glycol (SYSTANE ULTRA) 0.4-0.3 % SOLN, Place 1-2 drops into both eyes 3 (three) times daily as needed (dry/irritated eyes)., Disp: , Rfl:    potassium chloride SA (KLOR-CON M) 20 MEQ tablet, Take 1 tablet (20 mEq total) by mouth daily. (Patient taking differently: Take 20 mEq by mouth daily with supper.), Disp: 90 tablet, Rfl: 3   pravastatin (PRAVACHOL) 20 MG tablet, Take 1 tablet (20 mg total) by mouth at bedtime., Disp: 90 tablet, Rfl: 2   predniSONE (DELTASONE) 5 MG tablet, Take 1 tablet (5 mg total) by mouth daily as directed, Disp: 90 tablet, Rfl: 9   sulfaSALAzine (AZULFIDINE) 500 MG tablet, Take 2 tablets (1,000 mg total) by mouth 2 (two) times daily., Disp: 360 tablet, Rfl: 1   tamsulosin (FLOMAX) 0.4 MG CAPS capsule, Take 1 capsule by mouth daily. (Patient taking differently: Take 0.4 mg by mouth at bedtime.), Disp: 90 capsule, Rfl: 3   sertraline (ZOLOFT) 100 MG tablet, Take 1 tablet (100 mg total) by mouth at bedtime., Disp: 90 tablet, Rfl: 1  No Known Allergies ROS neg/noncontributory except as noted HPI/below      Objective:     BP 137/76   Pulse 86   Temp 97.8 F (36.6 C)   Ht 5\' 1"  (1.549 m)   Wt 213 lb (96.6 kg)   SpO2 93%   BMI 40.25 kg/m  Wt Readings from Last 3 Encounters:  05/06/23 213 lb (96.6 kg)  12/28/22 218 lb 9.6 oz (99.2 kg)  12/10/22 213 lb 8 oz (96.8 kg)    Physical Exam   Gen: WDWN NAD HEENT: NCAT, conjunctiva not injected, sclera nonicteric NECK:  supple, no thyromegaly, no nodes, no carotid bruits CARDIAC: RRR, S1S2+, no murmur. Occ ectopic beats. DP  1+B LUNGS: CTAB. No wheezes ABDOMEN:  BS+, soft, NTND, No HSM, no masses EXT:  no edema MSK: no gross abnormalities.  NEURO: A&O x3.  CN II-XII intact.  PSYCH: normal mood. Good eye contact  LABS WBC: 11.5 (05/05/2023) Hemoglobin: 12.0 (05/05/2023) Hematocrit: 37.7 (05/05/2023) Platelets: 466 (05/05/2023) Glucose: 121 (05/05/2023) BUN: 10 (05/05/2023) Creatinine: 0.73 (05/05/2023) eGFR: 87 (05/05/2023) Sodium: 142 (05/05/2023) Potassium: 4.1 (05/05/2023) Chloride: 97 (05/05/2023) CO2: 27 (05/05/2023) Calcium: 9.8 (05/05/2023) Total Protein: 6.7 (05/05/2023) Albumin: 4.3 (05/05/2023) Bilirubin: 0.4 (05/05/2023) Alkaline Phosphatase: 114 (05/05/2023) AST: 18 (05/05/2023) ALT: 15 (05/05/2023)  RADIOLOGY Shoulder X-ray: Bone-on-bone arthritis with two large osteophytes  Results for orders placed or performed in visit on 05/06/23  POCT HgB A1C   Collection Time: 05/06/23  3:08 PM  Result Value Ref Range   Hemoglobin A1C 6.9 (A) 4.0 - 5.6 %   HbA1c POC (<> result, manual entry)     HbA1c, POC (prediabetic range)     HbA1c, POC (controlled diabetic range)     *Note: Due to a large number of results and/or encounters for the requested time period, some results have not been displayed. A complete set of results can be found in Results Review.  Assessment & Plan:  Poorly controlled type 2 diabetes mellitus with circulatory disorder (HCC) -     POCT glycosylated hemoglobin (Hb A1C)  Essential hypertension  Hypercholesterolemia  Hypothyroidism, postsurgical  Iron deficiency anemia due to chronic blood loss  Other orders -     Sertraline HCl; Take 1 tablet (100 mg total) by mouth at bedtime.  Dispense: 90 tablet; Refill: 1  Assessment and Plan    Left Shoulder Osteoarthritis Severe osteoarthritis with bone-on-bone contact and large bone spurs causes significant pain, disrupting sleep and daily activities. Reverse shoulder replacement surgery is planned, with  risks and benefits discussed. Alternatives like pain management and physical therapy are less effective. Proceed with surgery, call cardiology for preoperative clearance, and monitor for new symptoms such as chest pain or shortness of breath.  Type 2 Diabetes Mellitus Blood glucose levels are elevated at 150-170 mg/dL, influenced by shoulder pain, stress, poor diet, and inactivity. Dietary modifications and increased physical activity were discussed. Perform a finger stick A1c test and consider increasing Tresiba insulin from 23 to 26 units based on results. Encourage dietary changes and activities like seated marching.  Hypertension Blood pressure is well-controlled at 137/70 mmHg with no new symptoms. Continue the current antihypertensive regimen and monitor blood pressure regularly.  Medication Side Effects Severe headaches and diarrhea occurred after starting a new arthritis medication in late November, leading to its discontinuation. Discuss alternative arthritis medications with a rheumatologist.  General Health Maintenance Overall health is stable with compliance to the medication regimen and recent lab work completed. Ref[facility] sertraline at [facility], continue daily iron supplements, and schedule a follow-up appointment in a few months.  Follow-up Call cardiology to expedite preoperative clearance, send a note to Dr. Ave Filter regarding clearance, and schedule a follow-up appointment in a few months.         Return in about 3 months (around 08/03/2023) for chronic follow-up.     Angelena Sole, MD

## 2023-05-06 NOTE — Patient Instructions (Signed)
It was very nice to see you today! ° °Good luck with surgery ° ° °PLEASE NOTE: ° °If you had any lab tests please let us know if you have not heard back within a few days. You may see your results on MyChart before we have a chance to review them but we will give you a call once they are reviewed by us. If we ordered any referrals today, please let us know if you have not heard from their office within the next week.  ° °Please try these tips to maintain a healthy lifestyle: ° °Eat most of your calories during the day when you are active. Eliminate processed foods including packaged sweets (pies, cakes, cookies), reduce intake of potatoes, white bread, white pasta, and white rice. Look for whole grain options, oat flour or almond flour. ° °Each meal should contain half fruits/vegetables, one quarter protein, and one quarter carbs (no bigger than a computer mouse). ° °Cut down on sweet beverages. This includes juice, soda, and sweet tea. Also watch fruit intake, though this is a healthier sweet option, it still contains natural sugar! Limit to 3 servings daily. ° °Drink at least 1 glass of water with each meal and aim for at least 8 glasses per day ° °Exercise at least 150 minutes every week.   °

## 2023-05-07 ENCOUNTER — Other Ambulatory Visit (HOSPITAL_COMMUNITY): Payer: Self-pay

## 2023-05-08 MED ORDER — DOXEPIN HCL 10 MG PO CAPS
10.0000 mg | ORAL_CAPSULE | Freq: Every day | ORAL | 1 refills | Status: DC
  Filled 2023-05-08: qty 90, 90d supply, fill #0
  Filled 2023-08-31: qty 90, 90d supply, fill #1

## 2023-05-09 ENCOUNTER — Telehealth: Payer: Self-pay | Admitting: Cardiology

## 2023-05-09 ENCOUNTER — Other Ambulatory Visit: Payer: Self-pay

## 2023-05-09 ENCOUNTER — Other Ambulatory Visit (HOSPITAL_COMMUNITY): Payer: Self-pay

## 2023-05-09 ENCOUNTER — Other Ambulatory Visit: Payer: Self-pay | Admitting: Family Medicine

## 2023-05-09 MED ORDER — FOLIC ACID 1 MG PO TABS
2.0000 mg | ORAL_TABLET | Freq: Every morning | ORAL | 0 refills | Status: DC
  Filled 2023-05-09: qty 60, 30d supply, fill #0

## 2023-05-09 NOTE — Telephone Encounter (Signed)
Sent over to requesting surgeon's office to make them aware of pt's upcoming appointment.

## 2023-05-09 NOTE — Telephone Encounter (Signed)
Preoperative team, patient has upcoming appointment with Dr. Cristal Deer on 05/27/2023.  Please add preoperative cardiac evaluation to appointment notes.  I will defer preoperative cardiac evaluation to upcoming appointment.  Thank you for your help.  Thomasene Ripple. Meerab Maselli NP-C     05/09/2023, 12:45 PM [facility] [address] Office [phone] Fax [phone]

## 2023-05-09 NOTE — Telephone Encounter (Signed)
   Pre-operative Risk Assessment    Patient Name: Brittany Acosta  DOB: 12-Jun-1950 MRN: [id number]   Date of last office visit: 05/20/22 Date of next office visit: 05/27/23    Request for Surgical Clearance    Procedure:   Left reverse total shoulder replacement   Date of Surgery:  Clearance TBD                                Surgeon:  Dr. Ave Filter  Surgeon's Group or Practice Name:  [facility]  Phone number:  [phone] Fax number:  [phone]   Type of Clearance Requested:   - Medical  - Pharmacy:  Hold not sure      Type of Anesthesia:   Choice    Additional requests/questions:    SignedForest Gleason   05/09/2023, 9:14 AM

## 2023-05-10 ENCOUNTER — Other Ambulatory Visit (HOSPITAL_COMMUNITY): Payer: Self-pay

## 2023-05-15 ENCOUNTER — Other Ambulatory Visit: Payer: Self-pay

## 2023-05-17 ENCOUNTER — Other Ambulatory Visit: Payer: Self-pay | Admitting: Family Medicine

## 2023-05-17 ENCOUNTER — Other Ambulatory Visit (HOSPITAL_COMMUNITY): Payer: Self-pay

## 2023-05-17 MED ORDER — FOLIC ACID 1 MG PO TABS
2.0000 mg | ORAL_TABLET | Freq: Every morning | ORAL | 0 refills | Status: DC
  Filled 2023-05-17 – 2023-07-01 (×3): qty 60, 30d supply, fill #0

## 2023-05-18 ENCOUNTER — Other Ambulatory Visit (HOSPITAL_COMMUNITY): Payer: Self-pay

## 2023-05-19 NOTE — Telephone Encounter (Signed)
Copied from CRM [id number]. Topic: Medical Record Request - Records Request >> May 18, 2023  4:35 PM [place] J wrote: Reason for CRM: Darel Hong calling in from [employer] requesting patients most current notes from labs and office visit. Patient is not able to start surgery until this is relayed.  Callback [phone] Fax: [phone]  Office notes and labs faxed with confirmation.

## 2023-05-25 ENCOUNTER — Other Ambulatory Visit: Payer: Self-pay

## 2023-05-27 ENCOUNTER — Ambulatory Visit (HOSPITAL_BASED_OUTPATIENT_CLINIC_OR_DEPARTMENT_OTHER): Payer: HMO | Admitting: Cardiology

## 2023-05-27 VITALS — BP 134/68 | HR 64 | Ht 61.0 in | Wt 213.7 lb

## 2023-05-27 DIAGNOSIS — Z8249 Family history of ischemic heart disease and other diseases of the circulatory system: Secondary | ICD-10-CM | POA: Diagnosis not present

## 2023-05-27 DIAGNOSIS — I10 Essential (primary) hypertension: Secondary | ICD-10-CM

## 2023-05-27 DIAGNOSIS — R9431 Abnormal electrocardiogram [ECG] [EKG]: Secondary | ICD-10-CM

## 2023-05-27 DIAGNOSIS — Z01810 Encounter for preprocedural cardiovascular examination: Secondary | ICD-10-CM | POA: Diagnosis not present

## 2023-05-27 DIAGNOSIS — R600 Localized edema: Secondary | ICD-10-CM | POA: Diagnosis not present

## 2023-05-27 NOTE — Progress Notes (Signed)
 Cardiology Office Note:  .   Date:  05/27/2023  ID:  Brittany Acosta, DOB 1950/12/15, MRN [id number] PCP: Jeani Sow, MD  [facility] Providers Cardiologist:  Jodelle Red, MD {  History of Present Illness: .   Brittany Acosta is a 73 y.o. female with a hx of hypertension, OSA, type II diabetes with diabetic neuropathy, hypercholesterolemia, obesity, fibromyalgia who is seen for follow up.  Pertinent CV history:  Today: Here for follow up and preop clearance. Pending left reverse total shoulder replacement with Dr. Ave Filter, date TBD. Reviewed preop summarized below. Has chronic knee/hip/low back pain that limits her activity.  Has been told that her heart beat is irregular before. She does not notice this. No history of afib/flutter. ECG is NSR today.  Has mild chronic R ankle swelling, has not had any severe issues.  ROS: Denies chest pain, shortness of breath at rest or with normal exertion. No PND, orthopnea, or unexpected weight gain. No syncope or palpitations. ROS otherwise negative except as noted.   Studies Reviewed: Marland Kitchen    EKG:  EKG Interpretation Date/Time:  Friday May 27 2023 14:54:05 EST Ventricular Rate:  78 PR Interval:  130 QRS Duration:  136 QT Interval:  412 QTC Calculation: 469 R Axis:   -58  Text Interpretation: Normal sinus rhythm with sinus arrhythmia Right bundle branch block Left anterior fascicular block Bifascicular block Minimal voltage criteria for LVH, may be normal variant Confirmed by Jodelle Red [phone]) on 05/27/2023 3:42:44 PM    Physical Exam:   VS:  BP 134/68   Pulse 64   Ht 5\' 1"  (1.549 m)   Wt 213 lb 11.2 oz (96.9 kg)   SpO2 98%   BMI 40.38 kg/m    Wt Readings from Last 3 Encounters:  05/27/23 213 lb 11.2 oz (96.9 kg)  05/06/23 213 lb (96.6 kg)  12/28/22 218 lb 9.6 oz (99.2 kg)    GEN: Well nourished, well developed in no acute distress HEENT: Normal, moist mucous membranes NECK: No JVD CARDIAC:  regular rhythm with occasional ectopy, normal S1 and S2, no rubs or gallops. No murmur. VASCULAR: Radial and DP pulses 2+ bilaterally. No carotid bruits RESPIRATORY:  Clear to auscultation without rales, wheezing or rhonchi  ABDOMEN: Soft, non-tender, non-distended MUSCULOSKELETAL:  Ambulates independently SKIN: Warm and dry, no edema NEUROLOGIC:  Alert and oriented x 3. No focal neuro deficits noted. PSYCHIATRIC:  Normal affect    ASSESSMENT AND PLAN: .    Preoperative cardiovascular examination According to the Revised Cardiac Risk Index (RCRI), her Perioperative Risk of Major Cardiac Event is (%): 0.9  Her Functional Capacity in METs is: 3.63 according to the [facility] Activity Status Index (DASI).  The patient is not currently having active cardiac symptoms, but they cannot achieve >4 METs of activity.  According to ACC/AHA Guidelines, the recommendation is for stress testing prior to surgery. We discussed options, will pursue cardiac PET.  Informed Consent   Shared Decision Making/Informed Consent The risks [chest pain, shortness of breath, cardiac arrhythmias, dizziness, blood pressure fluctuations, myocardial infarction, stroke/transient ischemic attack, nausea, vomiting, allergic reaction, radiation exposure, metallic taste sensation and life-threatening complications (estimated to be 1 in 10,000)], benefits (risk stratification, diagnosing coronary artery disease, treatment guidance) and alternatives of a cardiac PET stress test were discussed in detail with Brittany Acosta and she agrees to proceed.      Lower extremity edema -most consistent with venous insufficiency -reviewed prior testing -counseled on compression stockings, elevation  Family history of CV disease Type II diabetes, with obesity -continue aspirin -she is on pravastatin. We discussed changing to a higher intensity statin, but she is having diffuse myalgias at baseline and is worried about these worsening with  changing statin. Continue to discuss   Hypertension: -continue lisinopril   Abnormal ECG: RBBB, LAFB (bifasicular block) -no syncope or red flag symptoms   R carotid bruit, prior -not heard today -duplex without plaque/stenosis  CV risk counseling and prevention -recommend heart healthy/Mediterranean diet, with whole grains, fruits, vegetable, fish, lean meats, nuts, and olive oil. Limit salt. -recommend moderate walking, 3-5 times/week for 30-50 minutes each session. Aim for at least 150 minutes.week. Goal should be pace of 3 miles/hours, or walking 1.5 miles in 30 minutes -recommend avoidance of tobacco products. Avoid excess alcohol.  Dispo: if testing unremarkable, follow up in 1 year  Signed, Jodelle Red, MD   Jodelle Red, MD, PhD, [facility] [place]  [facility]    Heart & Vascular at [facility] [address], [address] [place], [place] [address] [phone]

## 2023-05-27 NOTE — Patient Instructions (Signed)
Medication Instructions:  Your physician recommends that you continue on your current medications as directed. Please refer to the Current Medication list given to you today.   Testing/Procedures: Your physician recommended that you get a cardiac PET   Follow-Up: At [facility], you and your health needs are our priority.  As part of our continuing mission to provide you with exceptional heart care, we have created designated Provider Care Teams.  These Care Teams include your primary Cardiologist (physician) and Advanced Practice Providers (APPs -  Physician Assistants and Nurse Practitioners) who all work together to provide you with the care you need, when you need it.  We recommend signing up for the patient portal called "MyChart".  Sign up information is provided on this After Visit Summary.  MyChart is used to connect with patients for Virtual Visits (Telemedicine).  Patients are able to view lab/test results, encounter notes, upcoming appointments, etc.  Non-urgent messages can be sent to your provider as well.   To learn more about what you can do with MyChart, go to [email or web address].    Your next appointment:   1 year(s)  Provider:   Jodelle Red, MD    Other Instructions     Please report to Radiology at the [facility] Main Entrance 30 minutes early for your test.  [address] [place], [place] [address]   How to Prepare for Your Cardiac PET/CT Stress Test:  Nothing to eat or drink, except water, 3 hours prior to arrival time.  NO caffeine/decaffeinated products, or chocolate 12 hours prior to arrival. (Please note decaffeinated beverages (teas/coffees) still contain caffeine).  If you have caffeine within 12 hours prior, the test will need to be rescheduled.  Medication instructions: Do not take erectile dysfunction medications for 72 hours prior to test (sildenafil, tadalafil) Do not take nitrates (isosorbide mononitrate, Ranexa)  the day before or day of test Do not take tamsulosin the day before or morning of test Hold theophylline containing medications for 12 hours. Hold Dipyridamole 48 hours prior to the test.  Diabetic Preparation: If able to eat breakfast prior to 3 hour fasting, you may take all medications, including your insulin. Do not worry if you miss your breakfast dose of insulin - start at your next meal. If you do not eat prior to 3 hour fast-Hold all diabetes (oral and insulin) medications. Patients who wear a continuous glucose monitor MUST remove the device prior to scanning.  You may take your remaining medications with water.  NO perfume, cologne or lotion on chest or abdomen area. FEMALES - Please avoid wearing dresses to this appointment.  Total time is 1 to 2 hours; you may want to bring reading material for the waiting time.  IF YOU THINK YOU MAY BE PREGNANT, OR ARE NURSING PLEASE INFORM THE TECHNOLOGIST.  In preparation for your appointment, medication and supplies will be purchased.  Appointment availability is limited, so if you need to cancel or reschedule, please call the Radiology Department Scheduler at [phone] 24 hours in advance to avoid a cancellation fee of $100.00  What to Expect When you Arrive:  Once you arrive and check in for your appointment, you will be taken to a preparation room within the Radiology Department.  A technologist or Nurse will obtain your medical history, verify that you are correctly prepped for the exam, and explain the procedure.  Afterwards, an IV will be started in your arm and electrodes will be placed on your skin for  EKG monitoring during the stress portion of the exam. Then you will be escorted to the PET/CT scanner.  There, staff will get you positioned on the scanner and obtain a blood pressure and EKG.  During the exam, you will continue to be connected to the EKG and blood pressure machines.  A small, safe amount of a radioactive tracer will  be injected in your IV to obtain a series of pictures of your heart along with an injection of a stress agent.    After your Exam:  It is recommended that you eat a meal and drink a caffeinated beverage to counter act any effects of the stress agent.  Drink plenty of fluids for the remainder of the day and urinate frequently for the first couple of hours after the exam.  Your doctor will inform you of your test results within 7-10 business days.  For more information and frequently asked questions, please visit our website: [email or web address]  For questions about your test or how to prepare for your test, please call: Cardiac Imaging Nurse Navigators Office: [phone]

## 2023-06-05 ENCOUNTER — Encounter (HOSPITAL_BASED_OUTPATIENT_CLINIC_OR_DEPARTMENT_OTHER): Payer: Self-pay | Admitting: Cardiology

## 2023-06-06 ENCOUNTER — Other Ambulatory Visit (HOSPITAL_COMMUNITY): Payer: Self-pay

## 2023-06-08 ENCOUNTER — Ambulatory Visit
Admission: RE | Admit: 2023-06-08 | Discharge: 2023-06-08 | Disposition: A | Discharge status: Home / Self Care | Payer: HMO | Source: Ambulatory Visit | Attending: Family Medicine | Admitting: Family Medicine

## 2023-06-08 DIAGNOSIS — Z1231 Encounter for screening mammogram for malignant neoplasm of breast: Secondary | ICD-10-CM | POA: Diagnosis not present

## 2023-06-09 ENCOUNTER — Other Ambulatory Visit (HOSPITAL_COMMUNITY): Payer: Self-pay

## 2023-06-09 ENCOUNTER — Other Ambulatory Visit: Payer: Self-pay

## 2023-06-10 ENCOUNTER — Other Ambulatory Visit (HOSPITAL_COMMUNITY): Payer: Self-pay

## 2023-06-10 ENCOUNTER — Other Ambulatory Visit: Payer: Self-pay

## 2023-06-10 ENCOUNTER — Other Ambulatory Visit: Payer: Self-pay | Admitting: Family Medicine

## 2023-06-11 ENCOUNTER — Other Ambulatory Visit (HOSPITAL_COMMUNITY): Payer: Self-pay

## 2023-06-11 MED ORDER — TRUE METRIX BLOOD GLUCOSE TEST VI STRP
ORAL_STRIP | 4 refills | Status: AC
  Filled 2023-06-11: qty 200, 90d supply, fill #0
  Filled 2023-06-13: qty 200, 100d supply, fill #0
  Filled 2023-09-20 (×2): qty 200, 100d supply, fill #1
  Filled 2024-01-26: qty 200, 100d supply, fill #2
  Filled 2024-05-01: qty 200, 100d supply, fill #3

## 2023-06-13 ENCOUNTER — Other Ambulatory Visit (HOSPITAL_COMMUNITY): Payer: Self-pay

## 2023-06-13 ENCOUNTER — Other Ambulatory Visit: Payer: Self-pay

## 2023-06-14 ENCOUNTER — Other Ambulatory Visit (HOSPITAL_COMMUNITY): Payer: Self-pay

## 2023-06-14 MED ORDER — ONETOUCH VERIO W/DEVICE KIT
PACK | 0 refills | Status: DC
  Filled 2023-06-14: qty 1, 1d supply, fill #0

## 2023-06-14 MED ORDER — ONETOUCH DELICA PLUS LANCET33G MISC
3 refills | Status: AC
  Filled 2023-06-14: qty 200, 100d supply, fill #0
  Filled 2023-09-20 (×2): qty 200, 100d supply, fill #1
  Filled 2024-01-26: qty 200, 100d supply, fill #2
  Filled 2024-05-01: qty 200, 100d supply, fill #3

## 2023-06-17 ENCOUNTER — Other Ambulatory Visit (HOSPITAL_COMMUNITY): Payer: Self-pay

## 2023-06-17 ENCOUNTER — Other Ambulatory Visit: Payer: Self-pay

## 2023-06-27 DIAGNOSIS — Z961 Presence of intraocular lens: Secondary | ICD-10-CM | POA: Diagnosis not present

## 2023-06-27 DIAGNOSIS — H35033 Hypertensive retinopathy, bilateral: Secondary | ICD-10-CM | POA: Diagnosis not present

## 2023-06-27 DIAGNOSIS — H11131 Conjunctival pigmentations, right eye: Secondary | ICD-10-CM | POA: Diagnosis not present

## 2023-06-27 DIAGNOSIS — E119 Type 2 diabetes mellitus without complications: Secondary | ICD-10-CM | POA: Diagnosis not present

## 2023-06-28 ENCOUNTER — Other Ambulatory Visit (HOSPITAL_COMMUNITY): Payer: Self-pay

## 2023-07-01 ENCOUNTER — Other Ambulatory Visit (HOSPITAL_COMMUNITY): Payer: Self-pay

## 2023-07-01 ENCOUNTER — Other Ambulatory Visit: Payer: Self-pay

## 2023-07-04 ENCOUNTER — Other Ambulatory Visit: Payer: Self-pay

## 2023-07-09 ENCOUNTER — Other Ambulatory Visit (HOSPITAL_COMMUNITY): Payer: Self-pay

## 2023-07-15 ENCOUNTER — Telehealth: Payer: Self-pay | Admitting: Family Medicine

## 2023-07-15 NOTE — Telephone Encounter (Unsigned)
 Copied from CRM [id number]. Topic: Referral - Question >> Jul 15, 2023  3:23 PM Martinique E wrote: Reason for CRM: Patient was seen previously by a Rheumatology provider and she is requesting a new rheumatologist. Patient stated how she wants this referral to go to [facility], and she did not have a preference on the providers there. Callback number for patient is [phone] with any questions.

## 2023-07-25 ENCOUNTER — Other Ambulatory Visit: Payer: Self-pay | Admitting: *Deleted

## 2023-07-25 ENCOUNTER — Encounter: Payer: Self-pay | Admitting: *Deleted

## 2023-07-25 DIAGNOSIS — L4050 Arthropathic psoriasis, unspecified: Secondary | ICD-10-CM

## 2023-07-25 NOTE — Telephone Encounter (Signed)
 Referral placed. Patient notified.

## 2023-07-27 ENCOUNTER — Other Ambulatory Visit: Payer: Self-pay | Admitting: Family Medicine

## 2023-07-27 ENCOUNTER — Other Ambulatory Visit (HOSPITAL_COMMUNITY): Payer: Self-pay

## 2023-07-27 MED ORDER — FOLIC ACID 1 MG PO TABS
2.0000 mg | ORAL_TABLET | Freq: Every morning | ORAL | 0 refills | Status: DC
  Filled 2023-07-27: qty 60, 30d supply, fill #0

## 2023-08-01 ENCOUNTER — Other Ambulatory Visit (HOSPITAL_COMMUNITY): Payer: Self-pay

## 2023-08-03 ENCOUNTER — Other Ambulatory Visit: Payer: Self-pay | Admitting: Family Medicine

## 2023-08-03 ENCOUNTER — Ambulatory Visit: Payer: HMO | Admitting: Family Medicine

## 2023-08-03 MED ORDER — METFORMIN HCL 500 MG PO TABS
ORAL_TABLET | ORAL | 0 refills | Status: DC
Start: 2023-08-03 — End: 2023-11-01
  Filled 2023-08-03: qty 270, 90d supply, fill #0

## 2023-08-03 MED ORDER — FOLIC ACID 1 MG PO TABS
2.0000 mg | ORAL_TABLET | Freq: Every morning | ORAL | 0 refills | Status: DC
  Filled 2023-08-03: qty 180, 90d supply, fill #0

## 2023-08-03 NOTE — Telephone Encounter (Signed)
 Needs to resch appt

## 2023-08-04 ENCOUNTER — Other Ambulatory Visit (HOSPITAL_COMMUNITY): Payer: Self-pay

## 2023-08-05 ENCOUNTER — Encounter: Payer: Self-pay | Admitting: Family

## 2023-08-05 ENCOUNTER — Other Ambulatory Visit (HOSPITAL_COMMUNITY): Payer: Self-pay

## 2023-08-05 ENCOUNTER — Ambulatory Visit (INDEPENDENT_AMBULATORY_CARE_PROVIDER_SITE_OTHER): Admitting: Family

## 2023-08-05 VITALS — BP 114/58 | HR 84 | Temp 97.3°F | Ht 61.0 in | Wt 212.1 lb

## 2023-08-05 DIAGNOSIS — E7800 Pure hypercholesterolemia, unspecified: Secondary | ICD-10-CM | POA: Diagnosis not present

## 2023-08-05 DIAGNOSIS — E1165 Type 2 diabetes mellitus with hyperglycemia: Secondary | ICD-10-CM | POA: Diagnosis not present

## 2023-08-05 DIAGNOSIS — D500 Iron deficiency anemia secondary to blood loss (chronic): Secondary | ICD-10-CM | POA: Diagnosis not present

## 2023-08-05 DIAGNOSIS — E1159 Type 2 diabetes mellitus with other circulatory complications: Secondary | ICD-10-CM | POA: Diagnosis not present

## 2023-08-05 DIAGNOSIS — Z794 Long term (current) use of insulin: Secondary | ICD-10-CM | POA: Diagnosis not present

## 2023-08-05 DIAGNOSIS — I152 Hypertension secondary to endocrine disorders: Secondary | ICD-10-CM | POA: Diagnosis not present

## 2023-08-05 DIAGNOSIS — L4050 Arthropathic psoriasis, unspecified: Secondary | ICD-10-CM | POA: Diagnosis not present

## 2023-08-05 DIAGNOSIS — Z7984 Long term (current) use of oral hypoglycemic drugs: Secondary | ICD-10-CM | POA: Diagnosis not present

## 2023-08-05 DIAGNOSIS — E871 Hypo-osmolality and hyponatremia: Secondary | ICD-10-CM

## 2023-08-05 LAB — COMPREHENSIVE METABOLIC PANEL WITH GFR
ALT: 10 U/L (ref 0–35)
AST: 18 U/L (ref 0–37)
Albumin: 3.8 g/dL (ref 3.5–5.2)
Alkaline Phosphatase: 82 U/L (ref 39–117)
BUN: 13 mg/dL (ref 6–23)
CO2: 32 meq/L (ref 19–32)
Calcium: 9.3 mg/dL (ref 8.4–10.5)
Chloride: 93 meq/L — ABNORMAL LOW (ref 96–112)
Creatinine, Ser: 0.69 mg/dL (ref 0.40–1.20)
GFR: 86.36 mL/min (ref >60.00)
Glucose, Bld: 116 mg/dL — ABNORMAL HIGH (ref 70–99)
Potassium: 4.3 meq/L (ref 3.5–5.1)
Sodium: 133 meq/L — ABNORMAL LOW (ref 135–145)
Total Bilirubin: 0.2 mg/dL (ref 0.2–1.2)
Total Protein: 6.5 g/dL (ref 6.0–8.3)

## 2023-08-05 LAB — LIPID PANEL
Cholesterol: 120 mg/dL (ref 0–200)
HDL: 44 mg/dL (ref >39.00)
LDL Cholesterol: 57 mg/dL (ref 0–99)
NonHDL: 76.01
Total CHOL/HDL Ratio: 3
Triglycerides: 97 mg/dL (ref 0.0–149.0)
VLDL: 19.4 mg/dL (ref 0.0–40.0)

## 2023-08-05 MED ORDER — PREDNISONE 5 MG PO TABS
5.0000 mg | ORAL_TABLET | Freq: Every day | ORAL | 2 refills | Status: DC
  Filled 2023-08-05: qty 30, 30d supply, fill #0
  Filled 2023-08-31: qty 30, 30d supply, fill #1
  Filled 2023-10-01: qty 30, 30d supply, fill #2

## 2023-08-05 NOTE — Progress Notes (Unsigned)
 Patient ID: Brittany Acosta, female    DOB: 04-Apr-1951, 73 y.o.   MRN: [id number]  Chief Complaint  Patient presents with  . Hypertension  Discussed the use of AI scribe software for clinical note transcription with the patient, who gave verbal consent to proceed.  History of Present Illness Brittany Acosta is a 73 year old female with psoriatic arthritis, HTN, hyperlipidemia, and diabetes who presents for follow-up. She is currently taking prednisone  5 mg daily for psoriatic arthritis, but her prescription bottle indicates no refills. Her hands have been flaring up for over a week, causing significant pain, and she experiences swelling in her ankles. She also takes azulfidine  and is awaiting a new rheumatology appointment in September. She manages her diabetes with insulin  and metformin . Her cholesterol was last checked in May of the previous year and was within normal limits. She takes pravastatin  for cholesterol management. She also takes allopurinol  daily for gout, which still flares up occasionally, and experiences neuropathy in one of her toes. She has a history of anemia and takes iron supplements daily. Her last lab work in September showed an elevated white blood cell count and platelet count. She has not seen a hematologist despite a referral being made. She takes folic acid , which was recently prescribed. She picks up her medications from [facility].  Assessment & Plan Psoriatic arthritis Psoriatic arthritis affects her hands with pain and swelling. Managed with prednisone  and sulfasalazine . Awaiting rheumatology input. - Refilled prednisone  with 30-day supply and refills. - Refilled sulfasalazine  with 30-day supply and refills. - Scheduled follow-up with rheumatology.  Gout Gout managed with allopurinol . Occasional flares persist. - Continue allopurinol .  Diabetes mellitus Diabetes managed with insulin  and metformin . Stable control reported. - Continue insulin  therapy. -  Continue metformin .  Neuropathy Neuropathy affects one toe.  Anemia Anemia improved with iron supplementation. Recent labs showed elevated WBC and platelets, likely post-infection. - Continue daily iron supplementation. - Order CBC and CMP to monitor blood counts, kidney, and liver function.  Breast cancer Breast cancer managed with Arimidex . Stable condition. - Continue Arimidex .       Subjective:    Outpatient Medications Prior to Visit  Medication Sig Dispense Refill  . allopurinol  (ZYLOPRIM ) 300 MG tablet Take 1 tablet (300 mg total) by mouth daily. 90 tablet 3  . anastrozole  (ARIMIDEX ) 1 MG tablet Take 1 tablet (1 mg) by mouth daily. 90 tablet 3  . aspirin  EC 81 MG tablet Take 1 tablet (81 mg total) by mouth daily. Swallow whole. 90 tablet 3  . Biotin  10000 MCG TABS Take 10,000 mcg by mouth at bedtime.    Aaron Aas BLACK ELDERBERRY,BERRY-FLOWER, PO Take 1 capsule by mouth in the morning.    . Blood Glucose Monitoring Suppl (ONETOUCH VERIO) w/Device KIT Use as directed to monitor glucose daily and as needed 1 kit 0  . cetirizine (ZYRTEC) 10 MG tablet Take 20 mg by mouth in the morning.    . Cholecalciferol  (VITAMIN D3) 125 MCG (5000 UT) CAPS Take 5,000 Units by mouth at bedtime.    Aaron Aas CINNAMON PO Take 2,000 mg by mouth in the morning.    . Cranberry 500 MG TABS Take 1,000 mg by mouth in the morning.    . cyclobenzaprine  (FLEXERIL ) 10 MG tablet Take 1 tablet (10 mg) by mouth at bedtime as needed for muscle spasms. 90 tablet 3  . doxepin  (SINEQUAN ) 10 MG capsule Take 1 capsule (10 mg total) by mouth daily. 90 capsule 1  .  ferrous sulfate 325 (65 FE) MG tablet Take 325 mg by mouth in the morning.    . fluticasone  (FLONASE ) 50 MCG/ACT nasal spray Place 2 sprays into both nostrils daily. 48 g 2  . folic acid  (FOLVITE ) 1 MG tablet Take 2 tablets (2 mg total) by mouth in the morning. 180 tablet 0  . furosemide  (LASIX ) 40 MG tablet Take 1 tablet (40 mg total) by mouth daily. 90 tablet 10  .  glucose blood (TRUE METRIX BLOOD GLUCOSE TEST) test strip TEST BLOOD SUGAR EVERY DAY AND AS NEEDED 200 strip 4  . Homeopathic Products (EARACHE DROPS OT) Place 1-2 drops in ear(s) 3 (three) times daily as needed (ear pain.).    . insulin  degludec (TRESIBA  FLEXTOUCH) 100 UNIT/ML FlexTouch Pen Inject 26 Units into the skin daily. (Patient taking differently: Inject 23 Units into the skin in the morning.) 30 mL 2  . Insulin  Pen Needle (DROPLET PEN NEEDLES) 31G X 5 MM MISC USE TO INJECT INSULIN  DAILY 100 each 3  . lamoTRIgine  (LAMICTAL ) 200 MG tablet TAKE 1 TABLET (200 MG TOTAL) BY MOUTH DAILY AT 12 NOON. 90 tablet 10  . Lancets (ONETOUCH DELICA PLUS LANCET33G) MISC Use as directed to monitor glucose daily and as needed. 200 each 3  . levothyroxine  (SYNTHROID ) 50 MCG tablet Take 1 tablet (50 mcg total) by mouth daily. 90 tablet 3  . lisinopril  (ZESTRIL ) 5 MG tablet Take 1 tablet (5 mg total) by mouth daily. 90 tablet 10  . magnesium  oxide (MAG-OX) 400 (240 Mg) MG tablet Take 400 mg by mouth in the morning.    . metFORMIN  (GLUCOPHAGE ) 500 MG tablet Take 2 tablets (1,000 mg total) by mouth every morning AND 1 tablet (500 mg total) every evening. 270 tablet 0  . omeprazole  (PRILOSEC) 20 MG capsule TAKE 1 CAPSULE (20 MG TOTAL) BY MOUTH DAILY. TAKE 30 MINUTES BEFORE A MEAL 90 capsule 10  . ondansetron  (ZOFRAN -ODT) 4 MG disintegrating tablet Take 1 tablet (4 mg total) by mouth every 4 (four) hours as needed for nausea/vomiting 30 tablet 3  . oxybutynin  (DITROPAN -XL) 10 MG 24 hr tablet Take 1 tablet (10 mg total) by mouth at bedtime. 90 tablet 3  . Polyethyl Glycol-Propyl Glycol (SYSTANE ULTRA) 0.4-0.3 % SOLN Place 1-2 drops into both eyes 3 (three) times daily as needed (dry/irritated eyes).    . potassium chloride  SA (KLOR-CON  M) 20 MEQ tablet Take 1 tablet (20 mEq total) by mouth daily. (Patient taking differently: Take 20 mEq by mouth daily with supper.) 90 tablet 3  . pravastatin  (PRAVACHOL ) 20 MG tablet  Take 1 tablet (20 mg total) by mouth at bedtime. 90 tablet 2  . predniSONE  (DELTASONE ) 5 MG tablet Take 1 tablet (5 mg total) by mouth daily as directed 90 tablet 9  . sertraline  (ZOLOFT ) 100 MG tablet Take 1 tablet (100 mg total) by mouth at bedtime. 90 tablet 1  . sulfaSALAzine  (AZULFIDINE ) 500 MG tablet Take 2 tablets (1,000 mg total) by mouth 2 (two) times daily. 360 tablet 1  . tamsulosin  (FLOMAX ) 0.4 MG CAPS capsule Take 1 capsule by mouth daily. (Patient taking differently: Take 0.4 mg by mouth at bedtime.) 90 capsule 3  . leflunomide  (ARAVA ) 20 MG tablet Take 1 tablet (20 mg total) by mouth daily. 30 tablet 3   No facility-administered medications prior to visit.   Past Medical History:  Diagnosis Date  . Allergic rhinitis, cause unspecified   . Arthritis   . Bipolar disorder, unspecified (HCC)  depression  . Cancer (HCC)    skin cancer on left leg, left breast cancer  . Cataract   . Chronic diastolic heart failure (HCC)   . Depression   . GERD (gastroesophageal reflux disease)   . Headache   . History of gout    "haven't had it in several years" (02/08/2012)  . History of kidney stones   . History of wrist fracture    rt wrist  . Hypertension   . Iron deficiency anemia   . Kidney stones    sees urologist @ [facility]  . Morbid obesity (HCC)   . Neuropathy due to secondary diabetes (HCC)   . Nontoxic multinodular goiter   . Osteoporosis   . Personal history of radiation therapy   . Pneumonia   . PONV (postoperative nausea and vomiting)   . Psoriatic arthritis (HCC)   . PTSD (post-traumatic stress disorder)   . Pure hypercholesterolemia   . RBBB   . Scleroderma (HCC)   . Sleep apnea 10/2012   mild osa-did not need cpap -dr Bennetta Braun  . Type II diabetes mellitus (HCC)   . Unspecified hypothyroidism    Past Surgical History:  Procedure Laterality Date  . ABDOMINAL HYSTERECTOMY  1976  . BREAST EXCISIONAL BIOPSY Left   . BREAST LUMPECTOMY Left 04/25/2018  . BREAST  LUMPECTOMY WITH RADIOACTIVE SEED AND SENTINEL LYMPH NODE BIOPSY Left 04/25/2018   Procedure: LEFT BREAST LUMPECTOMY WITH RADIOACTIVE SEED AND SENTINEL LYMPH NODE BIOPSY;  Surgeon: Ayesha Lente, MD;  Location: [facility] OR;  Service: General;  Laterality: Left;  . BREAST SURGERY    . CARDIAC CATHETERIZATION  2001   sees Dr Peter Swaziland  . CATARACT EXTRACTION  2014  . CATARACT EXTRACTION, BILATERAL  02/2011   epps  . CHOLECYSTECTOMY  1985  . ESOPHAGOGASTRODUODENOSCOPY (EGD) WITH PROPOFOL  N/A 10/14/2022   Procedure: ESOPHAGOGASTRODUODENOSCOPY (EGD) WITH PROPOFOL ;  Surgeon: Brice Campi Albino Alu., MD;  Location: [facility];  Service: Gastroenterology;  Laterality: N/A;  . EUS N/A 10/14/2022   Procedure: UPPER ENDOSCOPIC ULTRASOUND (EUS) RADIAL;  Surgeon: Brice Campi Albino Alu., MD;  Location: [facility];  Service: Gastroenterology;  Laterality: N/A;  . EXCISIONAL HEMORRHOIDECTOMY     "dr cut out in his office" (02/08/2012)  . INCISIONAL BREAST BIOPSY  2000   right  . JOINT REPLACEMENT     rt knee  . KNEE ARTHROSCOPY  08/2009   right  . KNEE ARTHROSCOPY WITH MEDIAL MENISECTOMY     left  . Left Cystoscopy   1990  . LEFT OOPHORECTOMY  1980  . Lithotripsy (L) Kidney  1997  . LUMBAR LAMINECTOMY/DECOMPRESSION MICRODISCECTOMY Left 07/01/2016   Procedure: Laminectomy for synovial cyst - left - Lumbar four-five;  Surgeon: Agustina Aldrich, MD;  Location[facility] OR;  Service: Neurosurgery;  Laterality: Left;  . PARTIAL MASTECTOMY WITH NEEDLE LOCALIZATION Left 09/01/2012   Procedure: PARTIAL MASTECTOMY WITH NEEDLE LOCALIZATION;  Surgeon: Levert Ready, MD;  Location: [facility] OR;  Service: General;  Laterality: Left;  . Percitania stone removed (L) Kidney  1992  . REVERSE SHOULDER ARTHROPLASTY Right 02/12/2021   Procedure: REVERSE SHOULDER ARTHROPLASTY;  Surgeon: Sammye Cristal, MD;  Location: [facility];  Service: Orthopedics;  Laterality: Right;  . Right nasal surgery  08/1988  . Right sinus removed  08/1989    tooth partial  . ROTATOR CUFF REPAIR  2013   right shoulder x 2  . SHOULDER ARTHROSCOPY WITH ROTATOR CUFF REPAIR AND SUBACROMIAL DECOMPRESSION Left 08/20/2013   Procedure: SHOULDER ARTHROSCOPY  AND SUBACROMIAL  DECOMPRESSION;  Surgeon: Derald Flattery, MD;  Location: [facility];  Service: Orthopedics;  Laterality: Left;  Left shoulder arthroscopy, debridement, subacromial decompression, distal clavical resection  . THYROIDECTOMY  04/22/2011   Procedure: THYROIDECTOMY;  Surgeon: Consuella Denis, MD;  Location[facility] OR;  Service: ENT;  Laterality: N/A;  TOTAL THYROIDECOTMY  . TOTAL KNEE ARTHROPLASTY  06/18/2011   Procedure: TOTAL KNEE ARTHROPLASTY;  Surgeon: Ilean Mall, MD;  Location: [facility] OR;  Service: Orthopedics;  Laterality: Left;  DEPUY  . TOTAL KNEE ARTHROPLASTY  02/07/2012   Procedure: TOTAL KNEE ARTHROPLASTY;  Surgeon: Ilean Mall, MD;  Location: [facility] OR;  Service: Orthopedics;  Laterality: Right;  . TUBAL LIGATION  1972   No Known Allergies    Objective:    Physical Exam Vitals and nursing note reviewed.  Constitutional:      Appearance: Normal appearance.  Cardiovascular:     Rate and Rhythm: Normal rate and regular rhythm.  Pulmonary:     Effort: Pulmonary effort is normal.     Breath sounds: Normal breath sounds.  Musculoskeletal:        General: Normal range of motion.  Skin:    General: Skin is warm and dry.  Neurological:     Mental Status: She is alert.  Psychiatric:        Mood and Affect: Mood normal.        Behavior: Behavior normal.   BP (!) 114/58 (BP Location: Left Arm, Patient Position: Sitting, Cuff Size: Large)   Pulse 84   Temp (!) 97.3 F (36.3 C) (Temporal)   Ht 5\' 1"  (1.549 m)   Wt 212 lb 2 oz (96.2 kg)   SpO2 94%   BMI 40.08 kg/m  Wt Readings from Last 3 Encounters:  08/05/23 212 lb 2 oz (96.2 kg)  05/27/23 213 lb 11.2 oz (96.9 kg)  05/06/23 213 lb (96.6 kg)       Versa Gore, NP

## 2023-08-06 LAB — CBC WITH DIFFERENTIAL/PLATELET
Basophils Absolute: 0.1 10*3/uL (ref 0.0–0.1)
Basophils Relative: 0.6 % (ref 0.0–3.0)
Eosinophils Absolute: 0.3 10*3/uL (ref 0.0–0.7)
Eosinophils Relative: 3.7 % (ref 0.0–5.0)
HCT: 32.8 % — ABNORMAL LOW (ref 36.0–46.0)
Hemoglobin: 10.8 g/dL — ABNORMAL LOW (ref 12.0–15.0)
Lymphocytes Relative: 23.1 % (ref 12.0–46.0)
Lymphs Abs: 2 10*3/uL (ref 0.7–4.0)
MCHC: 32.9 g/dL (ref 30.0–36.0)
MCV: 86 fl (ref 78.0–100.0)
Monocytes Absolute: 0.7 10*3/uL (ref 0.1–1.0)
Monocytes Relative: 8 % (ref 3.0–12.0)
Neutro Abs: 5.7 10*3/uL (ref 1.4–7.7)
Neutrophils Relative %: 64.6 % (ref 43.0–77.0)
Platelets: 403 10*3/uL — ABNORMAL HIGH (ref 150.0–400.0)
RBC: 3.81 Mil/uL — ABNORMAL LOW (ref 3.87–5.11)
RDW: 15.5 % (ref 11.5–15.5)
WBC: 8.8 10*3/uL (ref 4.0–10.5)

## 2023-08-10 ENCOUNTER — Encounter: Payer: Self-pay | Admitting: Family

## 2023-08-10 DIAGNOSIS — H1131 Conjunctival hemorrhage, right eye: Secondary | ICD-10-CM | POA: Diagnosis not present

## 2023-08-10 NOTE — Addendum Note (Signed)
 Addended by: Lynzee Lindquist on: 08/10/2023 08:12 AM   Modules accepted: Orders

## 2023-08-10 NOTE — Progress Notes (Signed)
 See lab message, call pt if not read by end of day, thanks. Let me know what she says about the Lasix , thx.

## 2023-08-12 ENCOUNTER — Encounter (HOSPITAL_COMMUNITY): Payer: Self-pay

## 2023-08-15 ENCOUNTER — Other Ambulatory Visit (INDEPENDENT_AMBULATORY_CARE_PROVIDER_SITE_OTHER)

## 2023-08-15 DIAGNOSIS — E871 Hypo-osmolality and hyponatremia: Secondary | ICD-10-CM

## 2023-08-16 ENCOUNTER — Ambulatory Visit: Payer: Self-pay | Admitting: Family

## 2023-08-16 ENCOUNTER — Encounter (HOSPITAL_COMMUNITY)
Admission: RE | Admit: 2023-08-16 | Discharge: 2023-08-16 | Disposition: A | Discharge status: Home / Self Care | Payer: HMO | Source: Ambulatory Visit | Attending: Cardiology | Admitting: Cardiology

## 2023-08-16 DIAGNOSIS — Z01810 Encounter for preprocedural cardiovascular examination: Secondary | ICD-10-CM

## 2023-08-16 LAB — BASIC METABOLIC PANEL WITH GFR
BUN: 8 mg/dL (ref 6–23)
CO2: 30 meq/L (ref 19–32)
Calcium: 8.8 mg/dL (ref 8.4–10.5)
Chloride: 97 meq/L (ref 96–112)
Creatinine, Ser: 0.71 mg/dL (ref 0.40–1.20)
GFR: 84.59 mL/min (ref >60.00)
Glucose, Bld: 99 mg/dL (ref 70–99)
Potassium: 4.1 meq/L (ref 3.5–5.1)
Sodium: 135 meq/L (ref 135–145)

## 2023-08-16 LAB — NM PET CT CARDIAC PERFUSION MULTI W/ABSOLUTE BLOODFLOW
MBFR: 1.71
Nuc Rest EF: 61 %
Nuc Stress EF: 66 %
Rest MBF: 1.1 ml/g/min
Rest Nuclear Isotope Dose: 25 mCi
ST Depression (mm): 0 mm
Stress MBF: 1.88 ml/g/min
Stress Nuclear Isotope Dose: 25 mCi
TID: 1.05

## 2023-08-16 MED ORDER — RUBIDIUM RB82 GENERATOR (RUBYFILL)
24.9000 | PACK | Freq: Once | INTRAVENOUS | Status: AC
  Administered 2023-08-16: 24.9 via INTRAVENOUS

## 2023-08-16 MED ORDER — REGADENOSON 0.4 MG/5ML IV SOLN
0.4000 mg | Freq: Once | INTRAVENOUS | Status: AC
  Administered 2023-08-16: 0.4 mg via INTRAVENOUS

## 2023-08-16 MED ORDER — REGADENOSON 0.4 MG/5ML IV SOLN
INTRAVENOUS | Status: AC
  Filled 2023-08-16: qty 5

## 2023-08-19 ENCOUNTER — Telehealth: Payer: Self-pay

## 2023-08-19 NOTE — Telephone Encounter (Signed)
   Pre-operative Risk Assessment    Patient Name: Brittany Acosta  DOB: [birth year] MRN: [id number]   Date of last office visit: 05/27/23 Dr. Veryl Gottron Date of next office visit: None   Request for Surgical Clearance    Procedure:  Left Reverse Total Shoulder Arthroplasty  Date of Surgery:  Clearance TBD                                Surgeon:  Dr. Reid Capuchin Group or Practice Name:  [facility] Phone number:  [phone]  Fax number:  [phone]/ [phone]   Type of Clearance Requested:   - Medical  - Pharmacy:  Hold Aspirin  not indicated   Type of Anesthesia:  choice   Additional requests/questions:    SignedSudie Ely   08/19/2023, 4:20 PM

## 2023-08-22 ENCOUNTER — Telehealth: Payer: Self-pay

## 2023-08-22 NOTE — Telephone Encounter (Signed)
   Name: Brittany Acosta  DOB: May 29, 1950  MRN: [id number]  Primary Cardiologist: Sheryle Donning, MD   Preoperative team, please contact this patient and set up a phone call appointment for further preoperative risk assessment. Please obtain consent and complete medication review. Thank you for your help.  I confirm that guidance regarding antiplatelet and oral anticoagulation therapy has been completed and, if necessary, noted below.  Per office protocol, if patient is without any new symptoms or concerns at the time of their virtual visit, she may hold Aspirin  for 5-7 days prior to procedure. Please resume Aspirin  as soon as possible postprocedure, at the discretion of the surgeon.    I also confirmed the patient resides in the state of [place] . As per [facility] Medical Board telemedicine laws, the patient must reside in the state in which the provider is licensed.   Ava Boatman, NP 08/22/2023, 8:13 AM [facility]

## 2023-08-22 NOTE — Telephone Encounter (Signed)
  Patient Consent for Virtual Visit        Brittany Acosta has provided verbal consent on 08/22/2023 for a virtual visit (video or telephone).  Appt scheduled for 08/30/2023 @ 2:20pm Med rec and consent. Call patient at [phone].    CONSENT FOR VIRTUAL VISIT FOR:  Brittany Acosta  By participating in this virtual visit I agree to the following:  I hereby voluntarily request, consent and authorize [facility] and its employed or contracted physicians, physician assistants, nurse practitioners or other licensed health care professionals (the Practitioner), to provide me with telemedicine health care services (the "Services") as deemed necessary by the treating Practitioner. I acknowledge and consent to receive the Services by the Practitioner via telemedicine. I understand that the telemedicine visit will involve communicating with the Practitioner through live audiovisual communication technology and the disclosure of certain medical information by electronic transmission. I acknowledge that I have been given the opportunity to request an in-person assessment or other available alternative prior to the telemedicine visit and am voluntarily participating in the telemedicine visit.  I understand that I have the right to withhold or withdraw my consent to the use of telemedicine in the course of my care at any time, without affecting my right to future care or treatment, and that the Practitioner or I may terminate the telemedicine visit at any time. I understand that I have the right to inspect all information obtained and/or recorded in the course of the telemedicine visit and may receive copies of available information for a reasonable fee.  I understand that some of the potential risks of receiving the Services via telemedicine include:  Delay or interruption in medical evaluation due to technological equipment failure or disruption; Information transmitted may not be sufficient (e.g. poor  resolution of images) to allow for appropriate medical decision making by the Practitioner; and/or  In rare instances, security protocols could fail, causing a breach of personal health information.  Furthermore, I acknowledge that it is my responsibility to provide information about my medical history, conditions and care that is complete and accurate to the best of my ability. I acknowledge that Practitioner's advice, recommendations, and/or decision may be based on factors not within their control, such as incomplete or inaccurate data provided by me or distortions of diagnostic images or specimens that may result from electronic transmissions. I understand that the practice of medicine is not an exact science and that Practitioner makes no warranties or guarantees regarding treatment outcomes. I acknowledge that a copy of this consent can be made available to me via my patient portal [facility] MyChart), or I can request a printed copy by calling the office of [facility].    I understand that my insurance will be billed for this visit.   I have read or had this consent read to me. I understand the contents of this consent, which adequately explains the benefits and risks of the Services being provided via telemedicine.  I have been provided ample opportunity to ask questions regarding this consent and the Services and have had my questions answered to my satisfaction. I give my informed consent for the services to be provided through the use of telemedicine in my medical care

## 2023-08-22 NOTE — Telephone Encounter (Signed)
 Appt scheduled for 08/30/23 @ 2:20pm. Med rec and consent. Call patient at [phone].

## 2023-08-22 NOTE — Telephone Encounter (Signed)
 1st attempt : Called patient, NA, left message for patient to call our office to schedule telehealth pre-op clearance.

## 2023-08-25 ENCOUNTER — Ambulatory Visit (HOSPITAL_BASED_OUTPATIENT_CLINIC_OR_DEPARTMENT_OTHER): Payer: Self-pay | Admitting: Cardiology

## 2023-08-25 NOTE — Telephone Encounter (Signed)
 Called and spoke to pt. Pt overall confused about what was being said in result note. Reassurance given and advised pt to also speak to upcoming pre-op provider for some more clarification if possible.  Pt denies having any symptoms and had the following questions:  - Is this something she will have to worry about not or could she come back to this in the future?  - Since she is having no symptoms, she would like to know how soon would the repeat coronary CT?  Will route to MD and review. Pt verbalized understanding.

## 2023-08-25 NOTE — Telephone Encounter (Signed)
-----   Message from [facility] sent at 08/25/2023  9:23 AM EDT ----- I'm sending this to your mychart, but I'm also going to have my nurse call you to see if you have any questions, since this is a long message :) Also sending to the preop team since you have an appt next week with them.  I reviewed your stress test. There is one small area in the heart that may have low blood flow. It is small area and mild intensity, and doesn't appear to affect the muscle of the heart. By itself, we would typically consider this low risk. However, this test also measures something called myocardial blood flow reserve, which is low on your test. This can be due to microscopic vessels that don't flow as well as they should, or it can be due to major blockages.  Overall, this study may give us  more questions than answers. Your coronary CT three years ago showed only very minimal narrowing in one vessel, and there wasn't significant plaque in the vessel that feeds the part of the heart that showed up as abnormal on the test.   You have a phone visit next week with our preop team to discuss risks for surgery. I am not sure exactly which provider this is with, so I will include the team on this message. I believe the last time we talked that we did not have a date for surgery yet. If that is still the case, the best option would be to get an anatomic test to evaluate the blood vessels. If you are having chest pain or shortness of breath, a cardiac cath would be the definitive test. If you are not having symptoms, then repeating the coronary Ct is reasonable. I want you to get your orthopedic surgery as soon as we can, but I also want to make sure that we aren't sending you to surgery with an increased risk. There's a chance that the PET may have just been inaccurate (less common than our other stress tests, but still possible). Sorry that this whole process is taking so long!

## 2023-08-30 ENCOUNTER — Ambulatory Visit: Attending: Cardiology

## 2023-08-30 DIAGNOSIS — R9439 Abnormal result of other cardiovascular function study: Secondary | ICD-10-CM

## 2023-08-30 DIAGNOSIS — Z01810 Encounter for preprocedural cardiovascular examination: Secondary | ICD-10-CM

## 2023-08-30 NOTE — Progress Notes (Signed)
 Virtual Visit via Telephone Note   Because of Brittany Acosta co-morbid illnesses, she is at least at moderate risk for complications without adequate follow up.  This format is felt to be most appropriate for this patient at this time.  Due to technical limitations with video connection Web designer), today's appointment will be conducted as an audio only telehealth visit, and AMAL RENBARGER verbally agreed to proceed in this manner.   All issues noted in this document were discussed and addressed.  No physical exam could be performed with this format.  Evaluation Performed:  Preoperative cardiovascular risk assessment _____________   Date:  08/30/2023   Patient ID:  Brittany Acosta, DOB May 31, 1950, MRN [id number] Patient Location:  Home Provider location:   Office  Primary Care Provider:  Christel Cousins, MD Primary Cardiologist:  Sheryle Donning, MD  Chief Complaint / Patient Profile   73 y.o. y/o female with a h/o HTN, OSA, DM type II, HLD, obesity, fibromyalgia who is pending left reverse total shoulder arthroplasty and presents today for telephonic preoperative cardiovascular risk assessment.  History of Present Illness    Brittany Acosta is a 73 y.o. female who presents via audio/video conferencing for a telehealth visit today.  Pt was last seen in cardiology clinic on 05/27/2023 by Dr. Veryl Gottron.  At that time LAKEITHIA RASOR was doing well and was seeking preoperative clearance.  She underwent a cardiac PET stress test that was intermediate showing ischemia and small defect with mild reuptake.  The patient is now pending procedure as outlined above. Since her last visit, she   She  denies chest pain, shortness of breath, lower extremity edema, fatigue, palpitations, melena, hematuria, hemoptysis, diaphoresis, weakness, presyncope, syncope, orthopnea, and PND.     Patient is asymptomatic Dr. Veryl Gottron recommended repeating coronary CTA which was last done in 2022  Patient  will be no charge for today's visit since clearance was not provided and we will reach out to get her scheduled for coronary CTA in order to provide final clearance prior to shoulder surgery.  Past Medical History    Past Medical History:  Diagnosis Date   Allergic rhinitis, cause unspecified    Arthritis    Bipolar disorder, unspecified (HCC)    depression   Cancer (HCC)    skin cancer on left leg, left breast cancer   Cataract    Chronic diastolic heart failure (HCC)    Depression    GERD (gastroesophageal reflux disease)    Headache    History of gout    "haven't had it in several years" (02/08/2012)   History of kidney stones    History of wrist fracture    rt wrist   Hypertension    Iron deficiency anemia    Kidney stones    sees urologist @ [facility]   Morbid obesity (HCC)    Neuropathy due to secondary diabetes (HCC)    Nontoxic multinodular goiter    Osteoporosis    Personal history of radiation therapy    Pneumonia    PONV (postoperative nausea and vomiting)    Psoriatic arthritis (HCC)    PTSD (post-traumatic stress disorder)    Pure hypercholesterolemia    RBBB    Scleroderma (HCC)    Sleep apnea 10/2012   mild osa-did not need cpap -dr clance   Type II diabetes mellitus (HCC)    Unspecified hypothyroidism    Past Surgical History:  Procedure Laterality Date   ABDOMINAL HYSTERECTOMY  1976  BREAST EXCISIONAL BIOPSY Left    BREAST LUMPECTOMY Left 04/25/2018   BREAST LUMPECTOMY WITH RADIOACTIVE SEED AND SENTINEL LYMPH NODE BIOPSY Left 04/25/2018   Procedure: LEFT BREAST LUMPECTOMY WITH RADIOACTIVE SEED AND SENTINEL LYMPH NODE BIOPSY;  Surgeon: Ayesha Lente, MD;  Location: [facility] OR;  Service: General;  Laterality: Left;   BREAST SURGERY     CARDIAC CATHETERIZATION  2001   sees Dr Peter [place]   CATARACT EXTRACTION  2014   CATARACT EXTRACTION, BILATERAL  02/2011   epps   CHOLECYSTECTOMY  1985   ESOPHAGOGASTRODUODENOSCOPY (EGD) WITH PROPOFOL  N/A 10/14/2022    Procedure: ESOPHAGOGASTRODUODENOSCOPY (EGD) WITH PROPOFOL ;  Surgeon: Normie Becton., MD;  Location: [facility];  Service: Gastroenterology;  Laterality: N/A;   EUS N/A 10/14/2022   Procedure: UPPER ENDOSCOPIC ULTRASOUND (EUS) RADIAL;  Surgeon: Normie Becton., MD;  Location: [facility];  Service: Gastroenterology;  Laterality: N/A;   EXCISIONAL HEMORRHOIDECTOMY     "dr cut out in his office" (02/08/2012)   INCISIONAL BREAST BIOPSY  2000   right   JOINT REPLACEMENT     rt knee   KNEE ARTHROSCOPY  08/2009   right   KNEE ARTHROSCOPY WITH MEDIAL MENISECTOMY     left   Left Cystoscopy   1990   LEFT OOPHORECTOMY  1980   Lithotripsy (L) Kidney  1997   LUMBAR LAMINECTOMY/DECOMPRESSION MICRODISCECTOMY Left 07/01/2016   Procedure: Laminectomy for synovial cyst - left - Lumbar four-five;  Surgeon: Agustina Aldrich, MD;  Location: [facility];  Service: Neurosurgery;  Laterality: Left;   PARTIAL MASTECTOMY WITH NEEDLE LOCALIZATION Left 09/01/2012   Procedure: PARTIAL MASTECTOMY WITH NEEDLE LOCALIZATION;  Surgeon: Levert Ready, MD;  Location: [facility];  Service: General;  Laterality: Left;   Percitania stone removed (L) Kidney  1992   REVERSE SHOULDER ARTHROPLASTY Right 02/12/2021   Procedure: REVERSE SHOULDER ARTHROPLASTY;  Surgeon: Sammye Cristal, MD;  Location: [facility];  Service: Orthopedics;  Laterality: Right;   Right nasal surgery  08/1988   Right sinus removed  08/1989   tooth partial   ROTATOR CUFF REPAIR  2013   right shoulder x 2   SHOULDER ARTHROSCOPY WITH ROTATOR CUFF REPAIR AND SUBACROMIAL DECOMPRESSION Left 08/20/2013   Procedure: SHOULDER ARTHROSCOPY  AND SUBACROMIAL DECOMPRESSION;  Surgeon: Derald Flattery, MD;  Location: [facility];  Service: Orthopedics;  Laterality: Left;  Left shoulder arthroscopy, debridement, subacromial decompression, distal clavical resection   THYROIDECTOMY  04/22/2011   Procedure: THYROIDECTOMY;  Surgeon: Consuella Denis,  MD;  Location: [facility] OR;  Service: ENT;  Laterality: N/A;  TOTAL THYROIDECOTMY   TOTAL KNEE ARTHROPLASTY  06/18/2011   Procedure: TOTAL KNEE ARTHROPLASTY;  Surgeon: Ilean Mall, MD;  Location: [facility];  Service: Orthopedics;  Laterality: Left;  DEPUY   TOTAL KNEE ARTHROPLASTY  02/07/2012   Procedure: TOTAL KNEE ARTHROPLASTY;  Surgeon: Ilean Mall, MD;  Location: [facility];  Service: Orthopedics;  Laterality: Right;   TUBAL LIGATION  1972    Allergies  No Known Allergies  Home Medications    Prior to Admission medications   Medication Sig Start Date End Date Taking? Authorizing Provider  allopurinol  (ZYLOPRIM ) 300 MG tablet Take 1 tablet (300 mg total) by mouth daily. 12/12/22   Christel Cousins, MD  aspirin  EC 81 MG tablet Take 1 tablet (81 mg total) by mouth daily. Swallow whole. 01/25/20   Sheryle Donning, MD  Biotin  10000 MCG TABS Take 10,000 mcg by mouth at bedtime.    [provider]  BLACK ELDERBERRY,BERRY-FLOWER, PO Take 1 capsule by mouth in the morning.    [provider]  Blood Glucose Monitoring Suppl (ONETOUCH VERIO) w/Device KIT Use as directed to monitor glucose daily and as needed 06/14/23   Webb, Padonda B, FNP  cetirizine (ZYRTEC) 10 MG tablet Take 20 mg by mouth in the morning.    [provider]  Cholecalciferol  (VITAMIN D3) 125 MCG (5000 UT) CAPS Take 5,000 Units by mouth at bedtime.    [provider]  CINNAMON PO Take 2,000 mg by mouth in the morning.    [provider]  Cranberry 500 MG TABS Take 1,000 mg by mouth in the morning.    [provider]  cyclobenzaprine  (FLEXERIL ) 10 MG tablet Take 1 tablet (10 mg) by mouth at bedtime as needed for muscle spasms. 09/08/22   Christel Cousins, MD  doxepin  (SINEQUAN ) 10 MG capsule Take 1 capsule (10 mg total) by mouth daily. 05/08/23   Christel Cousins, MD  ferrous sulfate 325 (65 FE) MG tablet Take 325 mg by mouth in the morning.    [provider]  fluticasone   (FLONASE ) 50 MCG/ACT nasal spray Place 2 sprays into both nostrils daily. 02/07/23   Christel Cousins, MD  folic acid  (FOLVITE ) 1 MG tablet Take 2 tablets (2 mg total) by mouth in the morning. 08/03/23   Christel Cousins, MD  furosemide  (LASIX ) 40 MG tablet Take 1 tablet (40 mg total) by mouth daily. 02/08/23   Christel Cousins, MD  glucose blood (TRUE METRIX BLOOD GLUCOSE TEST) test strip TEST BLOOD SUGAR EVERY DAY AND AS NEEDED 06/11/23   Webb, Padonda B, FNP  Homeopathic Products (EARACHE DROPS OT) Place 1-2 drops in ear(s) 3 (three) times daily as needed (ear pain.).    [provider]  insulin  degludec (TRESIBA  FLEXTOUCH) 100 UNIT/ML FlexTouch Pen Inject 26 Units into the skin daily. Patient taking differently: Inject 23 Units into the skin in the morning. 01/26/22   Christel Cousins, MD  Insulin  Pen Needle (DROPLET PEN NEEDLES) 31G X 5 MM MISC USE TO INJECT INSULIN  DAILY 03/19/20   Alexander Iba, PA  lamoTRIgine  (LAMICTAL ) 200 MG tablet TAKE 1 TABLET (200 MG TOTAL) BY MOUTH DAILY AT 12 NOON. 02/08/23   Christel Cousins, MD  Lancets [facility] DELICA PLUS [place]) MISC Use as directed to monitor glucose daily and as needed. 06/14/23   Webb, Padonda B, FNP  levothyroxine  (SYNTHROID ) 50 MCG tablet Take 1 tablet (50 mcg total) by mouth daily. 12/12/22   Christel Cousins, MD  lisinopril  (ZESTRIL ) 5 MG tablet Take 1 tablet (5 mg total) by mouth daily. 02/08/23   Christel Cousins, MD  magnesium  oxide (MAG-OX) 400 (240 Mg) MG tablet Take 400 mg by mouth in the morning.    [provider]  metFORMIN  (GLUCOPHAGE ) 500 MG tablet Take 2 tablets (1,000 mg total) by mouth every morning AND 1 tablet (500 mg total) every evening. 08/03/23   Christel Cousins, MD  omeprazole  (PRILOSEC) 20 MG capsule TAKE 1 CAPSULE (20 MG TOTAL) BY MOUTH DAILY. TAKE 30 MINUTES BEFORE A MEAL 02/08/23   Christel Cousins, MD  ondansetron  (ZOFRAN -ODT) 4 MG disintegrating tablet Take 1 tablet (4 mg total) by mouth every 4  (four) hours as needed for nausea/vomiting 11/18/22   Christel Cousins, MD  oxybutynin  (DITROPAN -XL) 10 MG 24 hr tablet Take 1 tablet (10 mg total) by mouth at bedtime. 12/12/22   Waldo Guitar,  Retha Cast, MD  Polyethyl Glycol-Propyl Glycol (SYSTANE ULTRA) 0.4-0.3 % SOLN Place 1-2 drops into both eyes 3 (three) times daily as needed (dry/irritated eyes).    [provider]  potassium chloride  SA (KLOR-CON  M) 20 MEQ tablet Take 1 tablet (20 mEq total) by mouth daily. Patient taking differently: Take 20 mEq by mouth daily with supper. 09/08/22   Christel Cousins, MD  pravastatin  (PRAVACHOL ) 20 MG tablet Take 1 tablet (20 mg total) by mouth at bedtime. 12/12/22   Christel Cousins, MD  predniSONE  (DELTASONE ) 5 MG tablet Take 1 tablet (5 mg total) by mouth daily as directed 08/05/23   Versa Gore, NP  sertraline  (ZOLOFT ) 100 MG tablet Take 1 tablet (100 mg total) by mouth at bedtime. 05/06/23   Christel Cousins, MD  sulfaSALAzine  (AZULFIDINE ) 500 MG tablet Take 2 tablets (1,000 mg total) by mouth 2 (two) times daily. 02/14/23     tamsulosin  (FLOMAX ) 0.4 MG CAPS capsule Take 1 capsule by mouth daily. Patient taking differently: Take 0.4 mg by mouth at bedtime. 09/08/22   Christel Cousins, MD    Physical Exam    Vital Signs:  SHANINA KEPPLE does not have vital signs available for review today.  Given telephonic nature of communication, physical exam is limited. AAOx3. NAD. Normal affect.  Speech and respirations are unlabored.  Accessory Clinical Findings    None  Assessment & Plan    1.  Preoperative Cardiovascular Risk Assessment: - Patient's RCRI score is 0.9%  The patient was advised that if she develops new symptoms prior to surgery to contact our office to arrange for a follow-up visit, and she verbalized understanding.  (Reminder: Include SBE prophylaxis/Antiplatelet/Anticoag Instructions)  A copy of this note will be routed to requesting surgeon.  Time:   Today, I have spent 5 minutes  with the patient with telehealth technology discussing medical history, symptoms, and management plan.     Francene Ing, Retha Cast, NP  08/30/2023, 8:00 AM

## 2023-08-31 ENCOUNTER — Other Ambulatory Visit: Payer: Self-pay

## 2023-08-31 ENCOUNTER — Other Ambulatory Visit (HOSPITAL_COMMUNITY): Payer: Self-pay

## 2023-08-31 MED ORDER — METOPROLOL TARTRATE 100 MG PO TABS
ORAL_TABLET | ORAL | 0 refills | Status: DC
  Filled 2023-08-31: qty 1, 1d supply, fill #0

## 2023-08-31 NOTE — Addendum Note (Signed)
 Addended by: Shayna Eblen A on: 08/31/2023 08:31 AM   Modules accepted: Orders

## 2023-08-31 NOTE — Addendum Note (Signed)
 Addended by: Jerek Meulemans A on: 08/31/2023 08:11 AM   Modules accepted: Orders

## 2023-08-31 NOTE — Addendum Note (Signed)
 Addended by: Miakoda Mcmillion A on: 08/31/2023 08:21 AM   Modules accepted: Orders

## 2023-08-31 NOTE — Patient Instructions (Addendum)
 Your cardiac CT will be scheduled at one of the below locations:   [facility] [address] [place], [place] [address] [phone]  OR  [facility] [address] [place], [place] [address] [phone]  OR   [facility] [address] [place], [place] [address] [phone]  OR   [facility] [address] [place], [place] [address] [phone]  OR   [facility] and Vascular Tower [address]  [place], [place] [address] Opening August 01, 2023  If scheduled at [facility], please arrive at the [facility] and Children's Entrance (Entrance C2) of [facility] 30 minutes prior to test start time. You can use the FREE valet parking offered at entrance C (encouraged to control the heart rate for the test)  Proceed to the [facility] Radiology Department (first floor) to check-in and test prep.   All radiology patients and guests should use entrance C2 at [facility], accessed from [facility], even though the hospital's physical address listed is [address]    If scheduled at the Heart and Vascular Tower at [address], please enter the parking lot using the [address] entrance and use the FREE valet service at the patient drop-off area. Enter the buidling and check-in with registration on the main floor.  If scheduled at [facility] or [facility], please arrive 15 mins early for check-in and test prep.  There is spacious parking and easy access to the radiology department from the [facility] Heart and Vascular entrance. Please enter here and check-in with the desk attendant.   If scheduled at [facility], please arrive 30 minutes early for check-in and test prep.  Please follow these instructions carefully (unless otherwise directed):  An IV will be  required for this test and Nitroglycerin  will be given.    On the Night Before the Test: Be sure to Drink plenty of water . Do not consume any caffeinated/decaffeinated beverages or chocolate 12 hours prior to your test. Do not take any antihistamines 12 hours prior to your test.   On the Day of the Test: Drink plenty of water  until 1 hour prior to the test. Do not eat any food 1 hour prior to test. You may take your regular medications prior to the test.  Take metoprolol  (Lopressor ) 100 MG  two hours prior to test. (THIS HAS BEEN SENT TO YOUR PHARMACY) If you take Furosemide  please HOLD on the morning of the test. Patients who wear a continuous glucose monitor MUST remove the device prior to scanning. FEMALES- please wear underwire-free bra if available, avoid dresses & tight clothing          After the Test: Drink plenty of water . After receiving IV contrast, you may experience a mild flushed feeling. This is normal. On occasion, you may experience a mild rash up to 24 hours after the test. This is not dangerous. If this occurs, you can take Benadryl  25 mg, Zyrtec, Claritin, or Allegra and increase your fluid intake. (Patients taking Tikosyn should avoid Benadryl , and may take Zyrtec, Claritin, or Allegra) If you experience trouble breathing, this can be serious. If it is severe call 911 IMMEDIATELY. If it is mild, please call our office.  We will call to schedule your test 2-4 weeks out understanding that some insurance companies will need an authorization prior to the service being  performed.   For more information and frequently asked questions, please visit our website : [email or web address]  For non-scheduling related questions, please contact the cardiac imaging nurse navigator should you have any questions/concerns: Cardiac Imaging Nurse Navigators Direct Office Dial: [phone]   For scheduling needs, including cancellations and rescheduling, please call  [place], [phone].

## 2023-09-01 ENCOUNTER — Other Ambulatory Visit (HOSPITAL_COMMUNITY): Payer: Self-pay

## 2023-09-01 ENCOUNTER — Other Ambulatory Visit: Payer: Self-pay

## 2023-09-04 ENCOUNTER — Other Ambulatory Visit (HOSPITAL_COMMUNITY): Payer: Self-pay

## 2023-09-04 ENCOUNTER — Other Ambulatory Visit: Payer: Self-pay | Admitting: Cardiology

## 2023-09-06 ENCOUNTER — Encounter (HOSPITAL_COMMUNITY): Payer: Self-pay

## 2023-09-07 ENCOUNTER — Other Ambulatory Visit (HOSPITAL_COMMUNITY): Payer: Self-pay

## 2023-09-07 ENCOUNTER — Other Ambulatory Visit: Payer: Self-pay | Admitting: Cardiology

## 2023-09-08 ENCOUNTER — Ambulatory Visit (HOSPITAL_COMMUNITY)
Admission: RE | Admit: 2023-09-08 | Discharge: 2023-09-08 | Disposition: A | Discharge status: Home / Self Care | Source: Ambulatory Visit | Attending: Nurse Practitioner | Admitting: Nurse Practitioner

## 2023-09-08 DIAGNOSIS — R9439 Abnormal result of other cardiovascular function study: Secondary | ICD-10-CM | POA: Insufficient documentation

## 2023-09-08 DIAGNOSIS — R9430 Abnormal result of cardiovascular function study, unspecified: Secondary | ICD-10-CM

## 2023-09-08 DIAGNOSIS — I2510 Atherosclerotic heart disease of native coronary artery without angina pectoris: Secondary | ICD-10-CM | POA: Diagnosis not present

## 2023-09-08 DIAGNOSIS — Z01810 Encounter for preprocedural cardiovascular examination: Secondary | ICD-10-CM | POA: Diagnosis not present

## 2023-09-08 DIAGNOSIS — I700 Atherosclerosis of aorta: Secondary | ICD-10-CM | POA: Insufficient documentation

## 2023-09-08 DIAGNOSIS — I3139 Other pericardial effusion (noninflammatory): Secondary | ICD-10-CM | POA: Diagnosis not present

## 2023-09-08 MED ORDER — IOHEXOL 350 MG/ML SOLN
100.0000 mL | Freq: Once | INTRAVENOUS | Status: AC | PRN
  Administered 2023-09-08: 100 mL via INTRAVENOUS

## 2023-09-08 MED ORDER — NITROGLYCERIN 0.4 MG SL SUBL
0.8000 mg | SUBLINGUAL_TABLET | Freq: Once | SUBLINGUAL | Status: AC
  Administered 2023-09-08: 0.8 mg via SUBLINGUAL

## 2023-09-13 ENCOUNTER — Ambulatory Visit: Payer: Self-pay | Admitting: Nurse Practitioner

## 2023-09-13 DIAGNOSIS — I3139 Other pericardial effusion (noninflammatory): Secondary | ICD-10-CM

## 2023-09-14 ENCOUNTER — Ambulatory Visit (HOSPITAL_COMMUNITY)

## 2023-09-20 ENCOUNTER — Other Ambulatory Visit: Payer: Self-pay | Admitting: Cardiology

## 2023-09-20 ENCOUNTER — Other Ambulatory Visit (HOSPITAL_COMMUNITY): Payer: Self-pay

## 2023-09-20 ENCOUNTER — Other Ambulatory Visit: Payer: Self-pay

## 2023-09-20 MED ORDER — SULFASALAZINE 500 MG PO TABS
1000.0000 mg | ORAL_TABLET | Freq: Two times a day (BID) | ORAL | 1 refills | Status: DC
  Filled 2023-09-20: qty 360, 90d supply, fill #0
  Filled 2024-01-26: qty 360, 90d supply, fill #1

## 2023-09-22 ENCOUNTER — Other Ambulatory Visit (HOSPITAL_COMMUNITY): Payer: Self-pay

## 2023-09-22 ENCOUNTER — Other Ambulatory Visit: Payer: Self-pay | Admitting: Medical Genetics

## 2023-09-23 ENCOUNTER — Other Ambulatory Visit (HOSPITAL_COMMUNITY)
Admission: RE | Admit: 2023-09-23 | Discharge: 2023-09-23 | Disposition: A | Discharge status: Home / Self Care | Payer: Self-pay | Source: Ambulatory Visit | Attending: Medical Genetics | Admitting: Medical Genetics

## 2023-09-27 ENCOUNTER — Other Ambulatory Visit (HOSPITAL_COMMUNITY): Payer: Self-pay

## 2023-09-29 ENCOUNTER — Ambulatory Visit (HOSPITAL_COMMUNITY)
Admission: RE | Admit: 2023-09-29 | Discharge: 2023-09-29 | Disposition: A | Discharge status: Home / Self Care | Source: Ambulatory Visit | Attending: Cardiovascular Disease | Admitting: Cardiovascular Disease

## 2023-09-29 DIAGNOSIS — I3139 Other pericardial effusion (noninflammatory): Secondary | ICD-10-CM

## 2023-09-29 LAB — ECHOCARDIOGRAM COMPLETE
Area-P 1/2: 3.21 cm2
S' Lateral: 2.6 cm

## 2023-09-30 ENCOUNTER — Ambulatory Visit: Payer: Self-pay | Admitting: Nurse Practitioner

## 2023-09-30 ENCOUNTER — Telehealth: Payer: Self-pay | Admitting: Nurse Practitioner

## 2023-09-30 NOTE — Telephone Encounter (Signed)
   Patient Name: Brittany Acosta  DOB: Oct 06, 1950 MRN: [id number]  Primary Cardiologist: Shelda Bruckner, MD  Chart reviewed as part of pre-operative protocol coverage. Given past medical history and time since last visit, based on ACC/AHA guidelines, Brittany Acosta is at acceptable risk for the planned procedure without further cardiovascular testing.   The patient was advised that if she develops new symptoms prior to surgery to contact our office to arrange for a follow-up visit, and she verbalized understanding.  I will route this recommendation to the requesting party via Epic fax function and remove from pre-op pool.  Please call with questions.  Wyn Raddle, Jackee Shove, NP 09/30/2023, 7:47 AM .

## 2023-10-01 ENCOUNTER — Other Ambulatory Visit: Payer: Self-pay | Admitting: Cardiology

## 2023-10-03 ENCOUNTER — Other Ambulatory Visit: Payer: Self-pay

## 2023-10-03 NOTE — Telephone Encounter (Signed)
 Pt calling to get her test results. Please advise

## 2023-10-05 ENCOUNTER — Other Ambulatory Visit (HOSPITAL_COMMUNITY): Payer: Self-pay

## 2023-10-07 LAB — GENECONNECT MOLECULAR SCREEN: Genetic Analysis Overall Interpretation: NEGATIVE

## 2023-10-28 ENCOUNTER — Other Ambulatory Visit: Payer: Self-pay | Admitting: Family Medicine

## 2023-10-28 ENCOUNTER — Other Ambulatory Visit (HOSPITAL_COMMUNITY): Payer: Self-pay

## 2023-10-28 MED ORDER — POTASSIUM CHLORIDE CRYS ER 20 MEQ PO TBCR
20.0000 meq | EXTENDED_RELEASE_TABLET | Freq: Every day | ORAL | 0 refills | Status: DC
  Filled 2023-10-28: qty 90, 90d supply, fill #0

## 2023-10-30 ENCOUNTER — Other Ambulatory Visit: Payer: Self-pay | Admitting: Family Medicine

## 2023-10-30 MED ORDER — CYCLOBENZAPRINE HCL 10 MG PO TABS
10.0000 mg | ORAL_TABLET | Freq: Every evening | ORAL | 0 refills | Status: DC | PRN
  Filled 2023-10-30: qty 90, 90d supply, fill #0

## 2023-10-30 MED ORDER — SERTRALINE HCL 100 MG PO TABS
100.0000 mg | ORAL_TABLET | Freq: Every day | ORAL | 1 refills | Status: DC
  Filled 2023-10-30: qty 90, 90d supply, fill #0
  Filled 2024-01-30: qty 90, 90d supply, fill #1

## 2023-10-31 ENCOUNTER — Other Ambulatory Visit (HOSPITAL_COMMUNITY): Payer: Self-pay

## 2023-11-01 ENCOUNTER — Other Ambulatory Visit (HOSPITAL_COMMUNITY): Payer: Self-pay

## 2023-11-01 ENCOUNTER — Other Ambulatory Visit: Payer: Self-pay | Admitting: Family Medicine

## 2023-11-01 ENCOUNTER — Other Ambulatory Visit (HOSPITAL_BASED_OUTPATIENT_CLINIC_OR_DEPARTMENT_OTHER): Payer: Self-pay

## 2023-11-01 MED ORDER — METFORMIN HCL 500 MG PO TABS
ORAL_TABLET | ORAL | 0 refills | Status: DC
Start: 2023-11-01 — End: 2023-11-22
  Filled 2023-11-01: qty 270, 90d supply, fill #0

## 2023-11-02 DIAGNOSIS — M19212 Secondary osteoarthritis, left shoulder: Secondary | ICD-10-CM | POA: Diagnosis not present

## 2023-11-03 ENCOUNTER — Telehealth: Payer: Self-pay

## 2023-11-03 NOTE — Telephone Encounter (Signed)
     Primary Cardiologist: Shelda Bruckner, MD  Chart reviewed as part of pre-operative protocol coverage. Given past medical history and time since last visit, based on ACC/AHA guidelines, Brittany Acosta would be at acceptable risk for the planned procedure without further cardiovascular testing.   Regarding ASA therapy, we recommend continuation of ASA throughout the perioperative period. However, if the surgeon feels that cessation of ASA is required in the perioperative period, it may be stopped 5-7 days prior to surgery with a plan to resume it as soon as felt to be feasible from a surgical standpoint in the post-operative period.   I will route this recommendation to the requesting party via Epic fax function and remove from pre-op pool.  Please call with questions.  Josefa HERO. Skie Vitrano NP-C     11/03/2023, 1:56 PM [facility] [address] Office [phone] Fax [phone]

## 2023-11-03 NOTE — Telephone Encounter (Signed)
   Pre-operative Risk Assessment    Patient Name: Brittany Acosta  DOB: 1951-01-06 MRN: [id number]   Date of last office visit: 05/27/23 LONNI SLAIN, MD Date of next office visit: NONE   Request for Surgical Clearance    Procedure:  LEFT REVERSE TOTAL SHOULDER ARTHROPLASTY  Date of Surgery:  Clearance 12/15/23      (PER REQUEST POSS PENDING CLEARANCE)                          Surgeon:  EVA ORN. DOZIER, MD Surgeon's Group or Practice Name:  [facility] Phone number:  [phone] Fax number:  [phone]  ATTN: VINA BONINE   Type of Clearance Requested:   - Medical  - Pharmacy:  Hold Aspirin      Type of Anesthesia:  CHOICE   Additional requests/questions:    SignedLucie DELENA Ku   11/03/2023, 1:24 PM

## 2023-11-04 ENCOUNTER — Other Ambulatory Visit: Payer: Self-pay | Admitting: Orthopedic Surgery

## 2023-11-07 ENCOUNTER — Ambulatory Visit: Admitting: Family Medicine

## 2023-11-22 ENCOUNTER — Other Ambulatory Visit (HOSPITAL_COMMUNITY): Payer: Self-pay

## 2023-11-22 ENCOUNTER — Encounter: Payer: Self-pay | Admitting: Family Medicine

## 2023-11-22 ENCOUNTER — Ambulatory Visit (INDEPENDENT_AMBULATORY_CARE_PROVIDER_SITE_OTHER): Admitting: Family Medicine

## 2023-11-22 VITALS — BP 150/78 | HR 69 | Temp 97.5°F | Resp 18 | Ht 61.0 in | Wt 213.1 lb

## 2023-11-22 DIAGNOSIS — K219 Gastro-esophageal reflux disease without esophagitis: Secondary | ICD-10-CM | POA: Diagnosis not present

## 2023-11-22 DIAGNOSIS — E538 Deficiency of other specified B group vitamins: Secondary | ICD-10-CM | POA: Diagnosis not present

## 2023-11-22 DIAGNOSIS — L4050 Arthropathic psoriasis, unspecified: Secondary | ICD-10-CM

## 2023-11-22 DIAGNOSIS — E1165 Type 2 diabetes mellitus with hyperglycemia: Secondary | ICD-10-CM | POA: Diagnosis not present

## 2023-11-22 DIAGNOSIS — D500 Iron deficiency anemia secondary to blood loss (chronic): Secondary | ICD-10-CM | POA: Diagnosis not present

## 2023-11-22 DIAGNOSIS — M1A9XX1 Chronic gout, unspecified, with tophus (tophi): Secondary | ICD-10-CM | POA: Diagnosis not present

## 2023-11-22 DIAGNOSIS — E7800 Pure hypercholesterolemia, unspecified: Secondary | ICD-10-CM | POA: Diagnosis not present

## 2023-11-22 DIAGNOSIS — E890 Postprocedural hypothyroidism: Secondary | ICD-10-CM

## 2023-11-22 DIAGNOSIS — I10 Essential (primary) hypertension: Secondary | ICD-10-CM | POA: Diagnosis not present

## 2023-11-22 DIAGNOSIS — Z7984 Long term (current) use of oral hypoglycemic drugs: Secondary | ICD-10-CM

## 2023-11-22 DIAGNOSIS — Z794 Long term (current) use of insulin: Secondary | ICD-10-CM

## 2023-11-22 DIAGNOSIS — F319 Bipolar disorder, unspecified: Secondary | ICD-10-CM | POA: Diagnosis not present

## 2023-11-22 DIAGNOSIS — E1159 Type 2 diabetes mellitus with other circulatory complications: Secondary | ICD-10-CM

## 2023-11-22 MED ORDER — FOLIC ACID 1 MG PO TABS
2.0000 mg | ORAL_TABLET | Freq: Every morning | ORAL | 0 refills | Status: DC
  Filled 2023-11-22: qty 180, 90d supply, fill #0

## 2023-11-22 MED ORDER — TAMSULOSIN HCL 0.4 MG PO CAPS
0.4000 mg | ORAL_CAPSULE | Freq: Every day | ORAL | 3 refills | Status: AC
Start: 2023-11-22 — End: ?
  Filled 2023-11-22 – 2023-11-28 (×2): qty 90, 90d supply, fill #0
  Filled 2024-02-25: qty 90, 90d supply, fill #1

## 2023-11-22 MED ORDER — ALLOPURINOL 300 MG PO TABS
300.0000 mg | ORAL_TABLET | Freq: Every day | ORAL | 3 refills | Status: AC
  Filled 2023-11-22: qty 90, 90d supply, fill #0
  Filled 2024-03-06: qty 90, 90d supply, fill #1

## 2023-11-22 MED ORDER — METFORMIN HCL 500 MG PO TABS
ORAL_TABLET | ORAL | 0 refills | Status: DC
  Filled 2023-11-22 – 2024-02-06 (×2): qty 270, 90d supply, fill #0

## 2023-11-22 MED ORDER — CYCLOBENZAPRINE HCL 10 MG PO TABS
10.0000 mg | ORAL_TABLET | Freq: Every evening | ORAL | 0 refills | Status: DC | PRN
  Filled 2023-11-22: qty 90, 90d supply, fill #0

## 2023-11-22 MED ORDER — DOXEPIN HCL 10 MG PO CAPS
10.0000 mg | ORAL_CAPSULE | Freq: Every day | ORAL | 1 refills | Status: DC
  Filled 2023-11-22: qty 90, 90d supply, fill #0
  Filled 2024-03-06: qty 90, 90d supply, fill #1

## 2023-11-22 MED ORDER — TRESIBA FLEXTOUCH 100 UNIT/ML ~~LOC~~ SOPN
26.0000 [IU] | PEN_INJECTOR | Freq: Every morning | SUBCUTANEOUS | 3 refills | Status: AC
  Filled 2023-11-22: qty 30, 90d supply, fill #0
  Filled 2024-02-17: qty 30, 90d supply, fill #1

## 2023-11-22 MED ORDER — PRAVASTATIN SODIUM 20 MG PO TABS
20.0000 mg | ORAL_TABLET | Freq: Every day | ORAL | 2 refills | Status: AC
  Filled 2023-11-22: qty 90, 90d supply, fill #0
  Filled 2024-03-06: qty 90, 90d supply, fill #1

## 2023-11-22 MED ORDER — POTASSIUM CHLORIDE CRYS ER 20 MEQ PO TBCR
20.0000 meq | EXTENDED_RELEASE_TABLET | Freq: Every day | ORAL | 0 refills | Status: DC
  Filled 2023-11-22 – 2024-01-26 (×2): qty 90, 90d supply, fill #0

## 2023-11-22 MED ORDER — OXYBUTYNIN CHLORIDE ER 10 MG PO TB24
10.0000 mg | ORAL_TABLET | Freq: Every day | ORAL | 3 refills | Status: AC
  Filled 2023-11-22: qty 90, 90d supply, fill #0
  Filled 2024-03-06: qty 90, 90d supply, fill #1

## 2023-11-22 NOTE — Progress Notes (Unsigned)
 Subjective:     Patient ID: Brittany Acosta, female    DOB: April 01, 1951, 74 y.o.   MRN: [id number]  Chief Complaint  Patient presents with   Medical Management of Chronic Issues    3 month follow-up Having left shoulder surgery on 12/15/23, reverse shoulder replacement    HPI Discussed the use of AI scribe software for clinical note transcription with the patient, who gave verbal consent to proceed.  History of Present Illness Brittany GIOVANNETTI is a 73 year old female with diabetes, hypertension, hyperlipidemia, hypothyroidism, and gout who presents for a regular follow-up and pre-operative clearance for left shoulder surgery. She is accompanied by her husband.  She is preparing for left shoulder surgery scheduled for September 11.   DM type 2-She reports that her blood sugar today was 134 mg/dL and that it is usually in the 90s to 100 mg/dL unless she eats before checking, and she occasionally experiences low blood sugar in the early morning if she delays eating..she takes Tresiba  26 units daily and metformin  1000mg  in am and 500mg  supper  For hypertension, she takes lisinopril  5 mg and furosemide  40 mg daily. She does not regularly monitor her blood pressure at home but believes it is usually well-controlled.  She takes omeprazole  20 mg daily for reflux, which is stable, although she experiences difficulty swallowing pills. She has not tried taking them with food substances like applesauce or yogurt.  Her gout is managed with allopurinol  300mg  once daily.   For her thyroid , she takes 50 mcg daily.  She takes pravastatin  20 mg daily for cholesterol management.  She also takes doxepin  for sleep issues, occasionally supplemented with melatonin, and reports difficulty sleeping due to shoulder pain.  She is under the care of a rheumatologist for psoriatic arthritis and is on sulfasalazine . She discontinued prednisone .  She takes iron supplements, which previously caused constipation, but she  reports improvement. She has seen a gastroenterologist earlier this year, who advised follow-up in a year after finding a small growth.  She takes Flonase  occasionally for allergies and reports having enough supply.  For mood stabilization, she takes lamotrigine  200 mg and sertraline  100 mg daily, with no recent mood disturbances or suicidal thoughts.  For bladder issues, she takes oxybutynin  10 mg and tamsulosin  0.4 mg daily, and she reports satisfactory urination control.  No dizziness, syncope, chest pain, dyspnea, vomiting, or diarrhea. She reports improvement in constipation since changing her iron supplement.    Health Maintenance Due  Topic Date Due   Diabetic kidney evaluation - Urine ACR  12/24/2009   Medicare Annual Wellness (AWV)  09/04/2020   OPHTHALMOLOGY EXAM  06/07/2023   HEMOGLOBIN A1C  11/03/2023   INFLUENZA VACCINE  11/04/2023    Past Medical History:  Diagnosis Date   Allergic rhinitis, cause unspecified    Arthritis    Bipolar disorder, unspecified (HCC)    depression   Cancer (HCC)    skin cancer on left leg, left breast cancer   Cataract    Chronic diastolic heart failure (HCC)    Depression    GERD (gastroesophageal reflux disease)    Headache    History of gout    haven't had it in several years (02/08/2012)   History of kidney stones    History of wrist fracture    rt wrist   Hypertension    Iron deficiency anemia    Kidney stones    sees urologist @ [facility]   Morbid obesity (HCC)  Neuropathy due to secondary diabetes (HCC)    Nontoxic multinodular goiter    Osteoporosis    Personal history of radiation therapy    Pneumonia    PONV (postoperative nausea and vomiting)    Psoriatic arthritis (HCC)    PTSD (post-traumatic stress disorder)    Pure hypercholesterolemia    RBBB    Scleroderma (HCC)    Sleep apnea 10/2012   mild osa-did not need cpap -dr clance   Type II diabetes mellitus (HCC)    Unspecified hypothyroidism     Past  Surgical History:  Procedure Laterality Date   ABDOMINAL HYSTERECTOMY  1976   BREAST EXCISIONAL BIOPSY Left    BREAST LUMPECTOMY Left 04/25/2018   BREAST LUMPECTOMY WITH RADIOACTIVE SEED AND SENTINEL LYMPH NODE BIOPSY Left 04/25/2018   Procedure: LEFT BREAST LUMPECTOMY WITH RADIOACTIVE SEED AND SENTINEL LYMPH NODE BIOPSY;  Surgeon: Mikell Katz, MD;  Location: [facility] OR;  Service: General;  Laterality: Left;   BREAST SURGERY     CARDIAC CATHETERIZATION  2001   sees Dr Peter [place]   CATARACT EXTRACTION  2014   CATARACT EXTRACTION, BILATERAL  02/2011   epps   CHOLECYSTECTOMY  1985   ESOPHAGOGASTRODUODENOSCOPY (EGD) WITH PROPOFOL  N/A 10/14/2022   Procedure: ESOPHAGOGASTRODUODENOSCOPY (EGD) WITH PROPOFOL ;  Surgeon: Wilhelmenia Aloha Raddle., MD;  Location: [facility];  Service: Gastroenterology;  Laterality: N/A;   EUS N/A 10/14/2022   Procedure: UPPER ENDOSCOPIC ULTRASOUND (EUS) RADIAL;  Surgeon: Wilhelmenia Aloha Raddle., MD;  Location: [facility];  Service: Gastroenterology;  Laterality: N/A;   EXCISIONAL HEMORRHOIDECTOMY     dr cut out in his office (02/08/2012)   INCISIONAL BREAST BIOPSY  2000   right   JOINT REPLACEMENT     rt knee   KNEE ARTHROSCOPY  08/2009   right   KNEE ARTHROSCOPY WITH MEDIAL MENISECTOMY     left   Left Cystoscopy   1990   LEFT OOPHORECTOMY  1980   Lithotripsy (L) Kidney  1997   LUMBAR LAMINECTOMY/DECOMPRESSION MICRODISCECTOMY Left 07/01/2016   Procedure: Laminectomy for synovial cyst - left - Lumbar four-five;  Surgeon: Victory Gunnels, MD;  Location: [facility] OR;  Service: Neurosurgery;  Laterality: Left;   PARTIAL MASTECTOMY WITH NEEDLE LOCALIZATION Left 09/01/2012   Procedure: PARTIAL MASTECTOMY WITH NEEDLE LOCALIZATION;  Surgeon: Elon CHRISTELLA Pacini, MD;  Location: [facility];  Service: General;  Laterality: Left;   Percitania stone removed (L) Kidney  1992   REVERSE SHOULDER ARTHROPLASTY Right 02/12/2021   Procedure: REVERSE SHOULDER ARTHROPLASTY;  Surgeon: Dozier Soulier, MD;  Location: [facility];  Service: Orthopedics;  Laterality: Right;   Right nasal surgery  08/1988   Right sinus removed  08/1989   tooth partial   ROTATOR CUFF REPAIR  2013   right shoulder x 2   SHOULDER ARTHROSCOPY WITH ROTATOR CUFF REPAIR AND SUBACROMIAL DECOMPRESSION Left 08/20/2013   Procedure: SHOULDER ARTHROSCOPY  AND SUBACROMIAL DECOMPRESSION;  Surgeon: Soulier Elsie Dozier, MD;  Location: [facility];  Service: Orthopedics;  Laterality: Left;  Left shoulder arthroscopy, debridement, subacromial decompression, distal clavical resection   THYROIDECTOMY  04/22/2011   Procedure: THYROIDECTOMY;  Surgeon: Vaughan JONETTA Ricker, MD;  Location: [facility] OR;  Service: ENT;  Laterality: N/A;  TOTAL THYROIDECOTMY   TOTAL KNEE ARTHROPLASTY  06/18/2011   Procedure: TOTAL KNEE ARTHROPLASTY;  Surgeon: Dempsey JINNY Sensor, MD;  Location: [facility];  Service: Orthopedics;  Laterality: Left;  DEPUY   TOTAL KNEE ARTHROPLASTY  02/07/2012   Procedure: TOTAL KNEE ARTHROPLASTY;  Surgeon: Dempsey  JINNY Sensor, MD;  Location: [facility];  Service: Orthopedics;  Laterality: Right;   TUBAL LIGATION  1972     Current Outpatient Medications:    aspirin  EC 81 MG tablet, Take 1 tablet (81 mg total) by mouth daily. Swallow whole., Disp: 90 tablet, Rfl: 3   Biotin  10000 MCG TABS, Take 10,000 mcg by mouth at bedtime., Disp: , Rfl:    BLACK ELDERBERRY,BERRY-FLOWER, PO, Take 1 capsule by mouth in the morning., Disp: , Rfl:    Blood Glucose Monitoring Suppl (ONETOUCH VERIO) w/Device KIT, Use as directed to monitor glucose daily and as needed, Disp: 1 kit, Rfl: 0   cetirizine (ZYRTEC) 10 MG tablet, Take 20 mg by mouth in the morning., Disp: , Rfl:    Cholecalciferol  (VITAMIN D3) 125 MCG (5000 UT) CAPS, Take 5,000 Units by mouth at bedtime., Disp: , Rfl:    CINNAMON PO, Take 2,000 mg by mouth in the morning., Disp: , Rfl:    Cranberry 500 MG TABS, Take 1,000 mg by mouth in the morning., Disp: , Rfl:    ferrous sulfate 325 (65 FE)  MG tablet, Take 325 mg by mouth in the morning., Disp: , Rfl:    fluticasone  (FLONASE ) 50 MCG/ACT nasal spray, Place 2 sprays into both nostrils daily., Disp: 48 g, Rfl: 2   furosemide  (LASIX ) 40 MG tablet, Take 1 tablet (40 mg total) by mouth daily., Disp: 90 tablet, Rfl: 10   glucose blood (TRUE METRIX BLOOD GLUCOSE TEST) test strip, TEST BLOOD SUGAR EVERY DAY AND AS NEEDED, Disp: 200 strip, Rfl: 4   Homeopathic Products (EARACHE DROPS OT), Place 1-2 drops in ear(s) 3 (three) times daily as needed (ear pain.)., Disp: , Rfl:    Insulin  Pen Needle (DROPLET PEN NEEDLES) 31G X 5 MM MISC, USE TO INJECT INSULIN  DAILY, Disp: 100 each, Rfl: 3   lamoTRIgine  (LAMICTAL ) 200 MG tablet, TAKE 1 TABLET (200 MG TOTAL) BY MOUTH DAILY AT 12 NOON., Disp: 90 tablet, Rfl: 10   Lancets (ONETOUCH DELICA PLUS LANCET33G) MISC, Use as directed to monitor glucose daily and as needed., Disp: 200 each, Rfl: 3   levothyroxine  (SYNTHROID ) 50 MCG tablet, Take 1 tablet (50 mcg total) by mouth daily., Disp: 90 tablet, Rfl: 3   lisinopril  (ZESTRIL ) 5 MG tablet, Take 1 tablet (5 mg total) by mouth daily., Disp: 90 tablet, Rfl: 10   magnesium  oxide (MAG-OX) 400 (240 Mg) MG tablet, Take 400 mg by mouth in the morning., Disp: , Rfl:    omeprazole  (PRILOSEC) 20 MG capsule, TAKE 1 CAPSULE (20 MG TOTAL) BY MOUTH DAILY. TAKE 30 MINUTES BEFORE A MEAL, Disp: 90 capsule, Rfl: 10   ondansetron  (ZOFRAN -ODT) 4 MG disintegrating tablet, Take 1 tablet (4 mg total) by mouth every 4 (four) hours as needed for nausea/vomiting, Disp: 30 tablet, Rfl: 3   Polyethyl Glycol-Propyl Glycol (SYSTANE ULTRA) 0.4-0.3 % SOLN, Place 1-2 drops into both eyes 3 (three) times daily as needed (dry/irritated eyes)., Disp: , Rfl:    sertraline  (ZOLOFT ) 100 MG tablet, Take 1 tablet (100 mg total) by mouth at bedtime., Disp: 90 tablet, Rfl: 1   sulfaSALAzine  (AZULFIDINE ) 500 MG tablet, Take 2 tablets (1,000 mg total) by mouth 2 (two) times daily., Disp: 360 tablet, Rfl:  1   allopurinol  (ZYLOPRIM ) 300 MG tablet, Take 1 tablet (300 mg total) by mouth daily., Disp: 90 tablet, Rfl: 3   cyclobenzaprine  (FLEXERIL ) 10 MG tablet, Take 1 tablet (10 mg) by mouth at bedtime as needed for muscle  spasms., Disp: 90 tablet, Rfl: 0   doxepin  (SINEQUAN ) 10 MG capsule, Take 1 capsule (10 mg total) by mouth daily., Disp: 90 capsule, Rfl: 1   folic acid  (FOLVITE ) 1 MG tablet, Take 2 tablets (2 mg total) by mouth in the morning., Disp: 180 tablet, Rfl: 0   insulin  degludec (TRESIBA  FLEXTOUCH) 100 UNIT/ML FlexTouch Pen, Inject 26 Units into the skin in the morning., Disp: 30 mL, Rfl: 3   metFORMIN  (GLUCOPHAGE ) 500 MG tablet, Take 2 tablets (1,000 mg total) by mouth every morning AND 1 tablet (500 mg total) every evening., Disp: 270 tablet, Rfl: 0   oxybutynin  (DITROPAN -XL) 10 MG 24 hr tablet, Take 1 tablet (10 mg total) by mouth at bedtime., Disp: 90 tablet, Rfl: 3   potassium chloride  SA (KLOR-CON  M) 20 MEQ tablet, Take 1 tablet (20 mEq total) by mouth daily., Disp: 90 tablet, Rfl: 0   pravastatin  (PRAVACHOL ) 20 MG tablet, Take 1 tablet (20 mg total) by mouth at bedtime., Disp: 90 tablet, Rfl: 2   tamsulosin  (FLOMAX ) 0.4 MG CAPS capsule, Take 1 capsule (0.4 mg total) by mouth at bedtime., Disp: 90 capsule, Rfl: 3  No Known Allergies ROS neg/noncontributory except as noted HPI/below      Objective:     BP (!) 150/78 (BP Location: Left Arm, Patient Position: Sitting, Cuff Size: Large)   Pulse 69   Temp (!) 97.5 F (36.4 C) (Temporal)   Resp 18   Ht 5' 1 (1.549 m)   Wt 213 lb 2 oz (96.7 kg)   SpO2 97%   BMI 40.27 kg/m  Wt Readings from Last 3 Encounters:  11/22/23 213 lb 2 oz (96.7 kg)  08/05/23 212 lb 2 oz (96.2 kg)  05/27/23 213 lb 11.2 oz (96.9 kg)    Physical Exam   Gen: WDWN NAD HEENT: NCAT, conjunctiva not injected, sclera nonicteric NECK:  supple, no thyromegaly, no nodes, no carotid bruits CARDIAC: RRR, S1S2+, no murmur. Occ ectopy DP 1+B LUNGS: CTAB. No  wheezes ABDOMEN:  BS+, soft, NTND, No HSM, no masses EXT:  no edema MSK: no gross abnormalities.  NEURO: A&O x3.  CN II-XII intact.  PSYCH: normal mood. Good eye contact     Assessment & Plan:  Poorly controlled type 2 diabetes mellitus with circulatory disorder (HCC) -     Comprehensive metabolic panel with GFR -     Hemoglobin A1c  Essential hypertension -     Uric acid  Hypothyroidism, postsurgical -     TSH  Hypercholesterolemia  Gastroesophageal reflux disease without esophagitis  Gout, tophaceous  Iron deficiency anemia due to chronic blood loss -     CBC with Differential/Platelet -     IBC + Ferritin  B12 deficiency -     Vitamin B12  Bipolar 1 disorder (HCC)  Psoriatic arthropathy (HCC)  Encounter for long-term (current) use of insulin  (HCC)  Long term current use of oral hypoglycemic drug  Other orders -     Cyclobenzaprine  HCl; Take 1 tablet (10 mg) by mouth at bedtime as needed for muscle spasms.  Dispense: 90 tablet; Refill: 0 -     Doxepin  HCl; Take 1 capsule (10 mg total) by mouth daily.  Dispense: 90 capsule; Refill: 1 -     Folic Acid ; Take 2 tablets (2 mg total) by mouth in the morning.  Dispense: 180 tablet; Refill: 0 -     Tresiba  FlexTouch; Inject 26 Units into the skin in the morning.  Dispense: 30 mL;  Refill: 3 -     metFORMIN  HCl; Take 2 tablets (1,000 mg total) by mouth every morning AND 1 tablet (500 mg total) every evening.  Dispense: 270 tablet; Refill: 0 -     Potassium Chloride  Crys ER; Take 1 tablet (20 mEq total) by mouth daily.  Dispense: 90 tablet; Refill: 0 -     Pravastatin  Sodium; Take 1 tablet (20 mg total) by mouth at bedtime.  Dispense: 90 tablet; Refill: 2 -     Tamsulosin  HCl; Take 1 capsule (0.4 mg total) by mouth at bedtime.  Dispense: 90 capsule; Refill: 3 -     Allopurinol ; Take 1 tablet (300 mg total) by mouth daily.  Dispense: 90 tablet; Refill: 3 -     oxyBUTYnin  Chloride ER; Take 1 tablet (10 mg total) by mouth at  bedtime.  Dispense: 90 tablet; Refill: 3  Assessment and Plan Assessment & Plan Type 2 diabetes mellitus   Type 2 diabetes mellitus is managed with metformin  and Tresiba . Blood sugar levels are generally well-controlled, with occasional lows in the early morning if meals are delayed. Continue metformin  as prescribed and Tresiba  at 26 units.  Hypertension   Hypertension is managed with lisinopril  and furosemide . Blood pressure is generally well-controlled, but she does not monitor it regularly at home. Continue lisinopril  5 mg daily and furosemide  40 mg daily. Advise checking blood pressure at least once a month. May be elevated today d/t pain.  Let us  know readings  Hyperlipidemia   Hyperlipidemia is managed with pravastatin . Continue pravastatin  20 mg daily.  Hypothyroidism   Hypothyroidism is managed with levothyroxine . She is advised to take it alone with water  and wait an hour before consuming other medications or food. Continue levothyroxine  50 mcg daily.  Gout   Gout is managed with allopurinol . Continue allopurinol  as prescribed.  Gastroesophageal reflux disease   Gastroesophageal reflux disease is managed with omeprazole . Continue omeprazole  20 mg daily.  Dysphagia (difficulty swallowing pills)   She experiences difficulty swallowing pills, particularly large ones like potassium. Various methods to aid swallowing were discussed. Try placing pills in applesauce, pudding, or yogurt to aid swallowing. For potassium, consider dissolving in water  to form a mush before consuming.  Iron deficiency anemia   Iron deficiency anemia is managed with iron supplements. Constipation from iron has improved. Continue iron supplementation as prescribed.  Allergic rhinitis   Allergic rhinitis is managed with Flonase , which she uses occasionally. Continue Flonase  as needed.  Psoriatic arthritis   Psoriatic arthritis is managed with sulfasalazine . She is transitioning to a new rheumatologist in  September. Continue sulfasalazine  as prescribed and follow up with rheumatology in September.  Bladder dysfunction (overactive bladder)   Bladder dysfunction is managed with Ditropan  and tamsulosin . Continue Ditropan  10 mg daily and tamsulosin  0.4 mg daily.  Depression   Depression is managed with Lamictal  and sertraline . Mood is stable with no suicidal thoughts. Continue Lamictal  200 mg daily and sertraline  100 mg daily.  Insomnia   Insomnia is managed with doxepin  and occasional melatonin. Left shoulder pain may be contributing to sleep difficulties. Continue doxepin  as prescribed. Consider using melatonin as needed and sleeping in a recliner or using a pillow for shoulder support.  Left shoulder pain (pending surgery)   Left shoulder pain is significant and surgery is scheduled for September 11. Proceed with scheduled shoulder surgery on September 11. Forms filled out  Constipation (improved)   Constipation has improved with current management.  Colonic polyp (under surveillance)   A colonic polyp  was identified earlier in the year and is under surveillance. Follow-up with GI is planned for next year. Continue surveillance as advised by GI.    Return in about 6 months (around 05/24/2024) for chronic follow-up.  Jenkins CHRISTELLA Carrel, MD

## 2023-11-22 NOTE — Patient Instructions (Signed)
 Put the potassium in a little bit of water  and eat the mush  Try applesauce/pudding/peanut butter/yogert for the meds.

## 2023-11-23 ENCOUNTER — Other Ambulatory Visit: Payer: Self-pay

## 2023-11-23 ENCOUNTER — Ambulatory Visit: Payer: Self-pay | Admitting: Family Medicine

## 2023-11-23 ENCOUNTER — Telehealth: Payer: Self-pay | Admitting: Family Medicine

## 2023-11-23 ENCOUNTER — Other Ambulatory Visit (HOSPITAL_COMMUNITY): Payer: Self-pay

## 2023-11-23 LAB — COMPREHENSIVE METABOLIC PANEL WITH GFR
ALT: 14 U/L (ref 0–35)
AST: 22 U/L (ref 0–37)
Albumin: 3.9 g/dL (ref 3.5–5.2)
Alkaline Phosphatase: 92 U/L (ref 39–117)
BUN: 11 mg/dL (ref 6–23)
CO2: 29 meq/L (ref 19–32)
Calcium: 9 mg/dL (ref 8.4–10.5)
Chloride: 94 meq/L — ABNORMAL LOW (ref 96–112)
Creatinine, Ser: 0.61 mg/dL (ref 0.40–1.20)
GFR: 88.78 mL/min (ref >60.00)
Glucose, Bld: 55 mg/dL — ABNORMAL LOW (ref 70–99)
Potassium: 4.1 meq/L (ref 3.5–5.1)
Sodium: 133 meq/L — ABNORMAL LOW (ref 135–145)
Total Bilirubin: 0.3 mg/dL (ref 0.2–1.2)
Total Protein: 6.7 g/dL (ref 6.0–8.3)

## 2023-11-23 LAB — CBC WITH DIFFERENTIAL/PLATELET
Basophils Absolute: 0.1 K/uL (ref 0.0–0.1)
Basophils Relative: 1 % (ref 0.0–3.0)
Eosinophils Absolute: 0.3 K/uL (ref 0.0–0.7)
Eosinophils Relative: 3.6 % (ref 0.0–5.0)
HCT: 34 % — ABNORMAL LOW (ref 36.0–46.0)
Hemoglobin: 11 g/dL — ABNORMAL LOW (ref 12.0–15.0)
Lymphocytes Relative: 33.3 % (ref 12.0–46.0)
Lymphs Abs: 3.2 K/uL (ref 0.7–4.0)
MCHC: 32.4 g/dL (ref 30.0–36.0)
MCV: 84.4 fl (ref 78.0–100.0)
Monocytes Absolute: 0.7 K/uL (ref 0.1–1.0)
Monocytes Relative: 7.7 % (ref 3.0–12.0)
Neutro Abs: 5.2 K/uL (ref 1.4–7.7)
Neutrophils Relative %: 54.4 % (ref 43.0–77.0)
Platelets: 471 K/uL — ABNORMAL HIGH (ref 150.0–400.0)
RBC: 4.03 Mil/uL (ref 3.87–5.11)
RDW: 15.7 % — ABNORMAL HIGH (ref 11.5–15.5)
WBC: 9.5 K/uL (ref 4.0–10.5)

## 2023-11-23 LAB — IBC + FERRITIN
Ferritin: 30.4 ng/mL (ref 10.0–291.0)
Iron: 79 ug/dL (ref 42–145)
Saturation Ratios: 26.6 % (ref 20.0–50.0)
TIBC: 296.8 ug/dL (ref 250.0–450.0)
Transferrin: 212 mg/dL (ref 212.0–360.0)

## 2023-11-23 LAB — URIC ACID: Uric Acid, Serum: 4.5 mg/dL (ref 2.4–7.0)

## 2023-11-23 LAB — TSH: TSH: 2.45 u[IU]/mL (ref 0.35–5.50)

## 2023-11-23 LAB — VITAMIN B12: Vitamin B-12: 273 pg/mL (ref 211–911)

## 2023-11-23 LAB — HEMOGLOBIN A1C: Hgb A1c MFr Bld: 6.4 % (ref 4.6–6.5)

## 2023-11-23 NOTE — Telephone Encounter (Signed)
 [facility] faxed Surgical Clearance, to be filled out by provider. Patient requested to send it back via Fax within ASAP. Document is located in providers tray at front office.Please advise at [phone].

## 2023-11-23 NOTE — Progress Notes (Signed)
 Labs are stable or improved.   Continue meds 2  Sugar was low-not sure if real or from blood sitting in lab.  Since A1C so good, decrease Tresiba  by 3 units so do 23 units daily.  Monitor sugars 3.  Take B12 vitamin one/day 1000 mcg

## 2023-11-28 ENCOUNTER — Other Ambulatory Visit: Payer: Self-pay

## 2023-11-28 ENCOUNTER — Other Ambulatory Visit: Payer: Self-pay | Admitting: Family Medicine

## 2023-11-28 ENCOUNTER — Other Ambulatory Visit (HOSPITAL_COMMUNITY): Payer: Self-pay

## 2023-11-28 DIAGNOSIS — H9203 Otalgia, bilateral: Secondary | ICD-10-CM

## 2023-11-28 DIAGNOSIS — J019 Acute sinusitis, unspecified: Secondary | ICD-10-CM

## 2023-11-28 MED ORDER — FLUTICASONE PROPIONATE 50 MCG/ACT NA SUSP
2.0000 | Freq: Every day | NASAL | 2 refills | Status: AC
  Filled 2023-11-28: qty 48, 90d supply, fill #0
  Filled 2024-03-06: qty 48, 90d supply, fill #1

## 2023-11-29 NOTE — Telephone Encounter (Signed)
 Called [facility] and spoke with Jennings. Vina confirmed they have received surgical clearance for patient.

## 2023-11-29 NOTE — Telephone Encounter (Signed)
 Noted

## 2023-12-06 NOTE — Care Plan (Signed)
 Ortho Bundle Case Management Note  Patient Details  Name: Brittany Acosta MRN: [id number] Date of Birth: [birth year]  spoke with patient. she will discharge to home with family to assist.no DME needed OPPT set up with [facility]. discharge instructions discussed and questions answered.  Patient and MD in agreement with plan. Choice offered.                    DME Arranged:    DME Agency:     HH Arranged:    HH Agency:     Additional Comments: Please contact me with any questions of if this plan should need to change.  Charlies Pitch,  RN,BSN,MHA,CCM  [facility]  [phone] 12/06/2023, 11:31 AM

## 2023-12-07 ENCOUNTER — Encounter (HOSPITAL_COMMUNITY): Payer: Self-pay

## 2023-12-07 NOTE — Patient Instructions (Signed)
 SURGICAL WAITING ROOM VISITATION  Patients having surgery or a procedure may have no more than 2 support people in the waiting area - these visitors may rotate.    Children under the age of 13 must have an adult with them who is not the patient.  Visitors with respiratory illnesses are discouraged from visiting and should remain at home.  If the patient needs to stay at the hospital during part of their recovery, the visitor guidelines for inpatient rooms apply. Pre-op nurse will coordinate an appropriate time for 1 support person to accompany patient in pre-op.  This support person may not rotate.    Please refer to the [facility] website for the visitor guidelines for Inpatients (after your surgery is over and you are in a regular room).       Your procedure is scheduled on: 12-15-23   Report to [facility] Main Entrance    Report to admitting at      0730  AM   Call this number if you have problems the morning of surgery [phone]   Do not eat food :After Midnight.   After Midnight you may have the following liquids until __0700____ AM/ DAY OF SURGERY   then nothing by mouth  Water  Non-Citrus Juices (without pulp, NO RED-Apple, White grape, White cranberry) Black Coffee (NO MILK/CREAM OR CREAMERS, sugar ok)  Clear Tea (NO MILK/CREAM OR CREAMERS, sugar ok) regular and decaf                             Plain Jell-O (NO RED)                                           Fruit ices (not with fruit pulp, NO RED)                                     Popsicles (NO RED)                                                               Sports drinks like Gatorade (NO RED)                    The day of surgery:  Drink ONE (1) Pre-Surgery G2 BY       0700 AM the morning of surgery. Drink in one sitting. Do not sip.  This drink was given to you during your hospital  pre-op appointment visit. Nothing else to drink after completing the  Pre-Surgery  G2.          If you have  questions, please contact your surgeon's office.   FOLLOW  ANY ADDITIONAL PRE OP INSTRUCTIONS YOU RECEIVED FROM YOUR SURGEON'S OFFICE!!!     Oral Hygiene is also important to reduce your risk of infection.                                    Remember - BRUSH YOUR TEETH THE MORNING  OF SURGERY WITH YOUR REGULAR TOOTHPASTE  DENTURES WILL BE REMOVED PRIOR TO SURGERY PLEASE DO NOT APPLY Poly grip OR ADHESIVES!!!   Do NOT smoke after Midnight   Stop all vitamins and herbal supplements 7 days before surgery.   Take these medicines the morning of surgery with A SIP OF WATER : Alulfidine, eye drops and zofran  if needed, omeprazole , doxepin , zyrtec,Allopurinol ,   DO NOT TAKE ANY ORAL DIABETIC MEDICATIONS DAY OF YOUR SURGERY  [place] day before surgery take 100% of your normal dose    [place] day of surgery take 50% of your normal dose  How to Manage Your Diabetes Before and After Surgery  Why is it important to control my blood sugar before and after surgery? Improving blood sugar levels before and after surgery helps healing and can limit problems. A way of improving blood sugar control is eating a healthy diet by:  Eating less sugar and carbohydrates  Increasing activity/exercise  Talking with your doctor about reaching your blood sugar goals High blood sugars (greater than 180 mg/dL) can raise your risk of infections and slow your recovery, so you will need to focus on controlling your diabetes during the weeks before surgery. Make sure that the doctor who takes care of your diabetes knows about your planned surgery including the date and location.  How do I manage my blood sugar before surgery? Check your blood sugar at least 4 times a day, starting 2 days before surgery, to make sure that the level is not too high or low. Check your blood sugar the morning of your surgery when you wake up and every 2 hours until you get to the Short Stay unit. If your blood sugar is less than 70  mg/dL, you will need to treat for low blood sugar: Do not take insulin . Treat a low blood sugar (less than 70 mg/dL) with  cup of clear juice (cranberry or apple), 4 glucose tablets, OR glucose gel. Recheck blood sugar in 15 minutes after treatment (to make sure it is greater than 70 mg/dL). If your blood sugar is not greater than 70 mg/dL on recheck, call [phone] for further instructions. Report your blood sugar to the short stay nurse when you get to Short Stay.  If you are admitted to the hospital after surgery: Your blood sugar will be checked by the staff and you will probably be given insulin  after surgery (instead of oral diabetes medicines) to make sure you have good blood sugar levels. The goal for blood sugar control after surgery is 80-180 mg/dL.   Bring CPAP mask and tubing day of surgery.                              You may not have any metal on your body including hair pins, jewelry, and body piercing             Do not wear make-up, lotions, powders, perfumes/cologne, or deodorant  Do not wear nail polish including gel and S&S, artificial/acrylic nails, or any other type of covering on natural nails including finger and toenails. If you have artificial nails, gel coating, etc. that needs to be removed by a nail salon please have this removed prior to surgery or surgery may need to be canceled/ delayed if the surgeon/ anesthesia feels like they are unable to be safely monitored.   Do not shave  48 hours prior to surgery.  Do not bring valuables to the hospital. [facility] IS NOT             RESPONSIBLE   FOR VALUABLES.   Contacts, glasses, dentures or bridgework may not be worn into surgery.   Bring small overnight bag day of surgery.   DO NOT BRING YOUR HOME MEDICATIONS TO THE HOSPITAL. PHARMACY WILL DISPENSE MEDICATIONS LISTED ON YOUR MEDICATION LIST TO YOU DURING YOUR ADMISSION IN THE HOSPITAL!    Patients discharged on the day of surgery will not be  allowed to drive home.  Someone NEEDS to stay with you for the first 24 hours after anesthesia.   Special Instructions: Bring a copy of your healthcare power of attorney and living will documents the day of surgery if you haven't scanned them before.              Please read over the following fact sheets you were given: IF YOU HAVE QUESTIONS ABOUT YOUR PRE-OP INSTRUCTIONS PLEASE CALL [phone].    If you test positive for Covid or have been in contact with anyone that has tested positive in the last 10 days please notify you surgeon.   [facility]- Preparing for Total Shoulder Arthroplasty    Before surgery, you can play an important role. Because skin is not sterile, your skin needs to be as free of germs as possible. You can reduce the number of germs on your skin by using the following products. Benzoyl Peroxide Gel Reduces the number of germs present on the skin Applied twice a day to shoulder area starting two days before surgery    ==================================================================  Please follow these instructions carefully:  BENZOYL PEROXIDE 5% GEL  Please do not use if you have an allergy to benzoyl peroxide.   If your skin becomes reddened/irritated stop using the benzoyl peroxide.  Starting two days before surgery, apply as follows: Apply benzoyl peroxide in the morning and at night. Apply after taking a shower. If you are not taking a shower clean entire shoulder front, back, and side along with the armpit with a clean wet washcloth.  Place a quarter-sized dollop on your shoulder and rub in thoroughly, making sure to cover the front, back, and side of your shoulder, along with the armpit.   2 days before ____ AM   ____ PM              1 day before ____ AM   ____ PM                         Do this twice a day for two days.  (Last application is the night before surgery, AFTER using the CHG soap as described below).  Do NOT apply benzoyl peroxide gel on the  day of surgery.       Pre-operative 5 CHG Bath Instructions   You can play a key role in reducing the risk of infection after surgery. Your skin needs to be as free of germs as possible. You can reduce the number of germs on your skin by washing with CHG (chlorhexidine  gluconate) soap before surgery. CHG is an antiseptic soap that kills germs and continues to kill germs even after washing.   DO NOT use if you have an allergy to chlorhexidine /CHG or antibacterial soaps. If your skin becomes reddened or irritated, stop using the CHG and notify one of our RNs at [phone].   Please shower with the CHG soap starting 4  days before surgery using the following schedule:     Please keep in mind the following:  DO NOT shave, including legs and underarms, starting the day of your first shower.   You may shave your face at any point before/day of surgery.  Place clean sheets on your bed the day you start using CHG soap. Use a clean washcloth (not used since being washed) for each shower. DO NOT sleep with pets once you start using the CHG.   CHG Shower Instructions:  If you choose to wash your hair and private area, wash first with your normal shampoo/soap.  After you use shampoo/soap, rinse your hair and body thoroughly to remove shampoo/soap residue.  Turn the water  OFF and apply about 3 tablespoons (45 ml) of CHG soap to a CLEAN washcloth.  Apply CHG soap ONLY FROM YOUR NECK DOWN TO YOUR TOES (washing for 3-5 minutes)  DO NOT use CHG soap on face, private areas, open wounds, or sores.  Pay special attention to the area where your surgery is being performed.  If you are having back surgery, having someone wash your back for you may be helpful. Wait 2 minutes after CHG soap is applied, then you may rinse off the CHG soap.  Pat dry with a clean towel  Put on clean clothes/pajamas   If you choose to wear lotion, please use ONLY the CHG-compatible lotions on the back of this paper.      Additional instructions for the day of surgery: DO NOT APPLY any lotions, deodorants, cologne, or perfumes.   Put on clean/comfortable clothes.  Brush your teeth.  Ask your nurse before applying any prescription medications to the skin.      CHG Compatible Lotions   Aveeno Moisturizing lotion  Cetaphil Moisturizing Cream  Cetaphil Moisturizing Lotion  Clairol Herbal Essence Moisturizing Lotion, Dry Skin  Clairol Herbal Essence Moisturizing Lotion, Extra Dry Skin  Clairol Herbal Essence Moisturizing Lotion, Normal Skin  Curel Age Defying Therapeutic Moisturizing Lotion with Alpha Hydroxy  Curel Extreme Care Body Lotion  Curel Soothing Hands Moisturizing Hand Lotion  Curel Therapeutic Moisturizing Cream, Fragrance-Free  Curel Therapeutic Moisturizing Lotion, Fragrance-Free  Curel Therapeutic Moisturizing Lotion, Original Formula  Eucerin Daily Replenishing Lotion  Eucerin Dry Skin Therapy Plus Alpha Hydroxy Crme  Eucerin Dry Skin Therapy Plus Alpha Hydroxy Lotion  Eucerin Original Crme  Eucerin Original Lotion  Eucerin Plus Crme Eucerin Plus Lotion  Eucerin TriLipid Replenishing Lotion  Keri Anti-Bacterial Hand Lotion  Keri Deep Conditioning Original Lotion Dry Skin Formula Softly Scented  Keri Deep Conditioning Original Lotion, Fragrance Free Sensitive Skin Formula  Keri Lotion Fast Absorbing Fragrance Free Sensitive Skin Formula  Keri Lotion Fast Absorbing Softly Scented Dry Skin Formula  Keri Original Lotion  Keri Skin Renewal Lotion Keri Silky Smooth Lotion  Keri Silky Smooth Sensitive Skin Lotion  Nivea Body Creamy Conditioning Oil  Nivea Body Extra Enriched Lotion  Nivea Body Original Lotion  Nivea Body Sheer Moisturizing Lotion Nivea Crme  Nivea Skin Firming Lotion  NutraDerm 30 Skin Lotion  NutraDerm Skin Lotion  NutraDerm Therapeutic Skin Cream  NutraDerm Therapeutic Skin Lotion  ProShield Protective Hand Cream   Incentive Spirometer  An incentive  spirometer is a tool that can help keep your lungs clear and active. This tool measures how well you are filling your lungs with each breath. Taking long deep breaths may help reverse or decrease the chance of developing breathing (pulmonary) problems (especially infection) following: A long period of time when  you are unable to move or be active. BEFORE THE PROCEDURE  If the spirometer includes an indicator to show your best effort, your nurse or respiratory therapist will set it to a desired goal. If possible, sit up straight or lean slightly forward. Try not to slouch. Hold the incentive spirometer in an upright position. INSTRUCTIONS FOR USE  Sit on the edge of your bed if possible, or sit up as far as you can in bed or on a chair. Hold the incentive spirometer in an upright position. Breathe out normally. Place the mouthpiece in your mouth and seal your lips tightly around it. Breathe in slowly and as deeply as possible, raising the piston or the ball toward the top of the column. Hold your breath for 3-5 seconds or for as long as possible. Allow the piston or ball to fall to the bottom of the column. Remove the mouthpiece from your mouth and breathe out normally. Rest for a few seconds and repeat Steps 1 through 7 at least 10 times every 1-2 hours when you are awake. Take your time and take a few normal breaths between deep breaths. The spirometer may include an indicator to show your best effort. Use the indicator as a goal to work toward during each repetition. After each set of 10 deep breaths, practice coughing to be sure your lungs are clear. If you have an incision (the cut made at the time of surgery), support your incision when coughing by placing a pillow or rolled up towels firmly against it. Once you are able to get out of bed, walk around indoors and cough well. You may stop using the incentive spirometer when instructed by your caregiver.  RISKS AND COMPLICATIONS Take your time  so you do not get dizzy or light-headed. If you are in pain, you may need to take or ask for pain medication before doing incentive spirometry. It is harder to take a deep breath if you are having pain. AFTER USE Rest and breathe slowly and easily. It can be helpful to keep track of a log of your progress. Your caregiver can provide you with a simple table to help with this. If you are using the spirometer at home, follow these instructions: SEEK MEDICAL CARE IF:  You are having difficultly using the spirometer. You have trouble using the spirometer as often as instructed. Your pain medication is not giving enough relief while using the spirometer. You develop fever of 100.5 F (38.1 C) or higher. SEEK IMMEDIATE MEDICAL CARE IF:  You cough up bloody sputum that had not been present before. You develop fever of 102 F (38.9 C) or greater. You develop worsening pain at or near the incision site. MAKE SURE YOU:  Understand these instructions. Will watch your condition. Will get help right away if you are not doing well or get worse. Document Released: 08/02/2006 Document Revised: 06/14/2011 Document Reviewed: 10/03/2006 [facility] Patient Information 2014 [place], [place].   ________________________________________________________________________

## 2023-12-07 NOTE — Progress Notes (Signed)
 PCP - Jenkins Earnie Carrel, MD LOV 11-22-23 epic clearance visit. Clearance in media Cardiologist - Clearance 11-03-23 Josefa Emelia PIETY Shelda Bruckner, MD lov 05-27-23 epic   PPM/ICD -  Device Orders -  Rep Notified -   Chest x-ray -  EKG - 05-27-23 EPIC Stress Test -  ECHO - 09-29-23 epic Cardiac Cath -  CT coronary-09-09-23 epic LABS-11-22-23 HgbA1c (6.4),CBC/diff, CMP epic  Sleep Study -Y CPAP - no  Fasting Blood Sugar - 87-100 Checks Blood Sugar __1___ times a day  Blood Thinner Instructions:n/a Aspirin  Instructions:asa 81 mg hold 5-7 days  ERAS Protcol -y PRE-SURGERY G2-    COVID vaccine -yes  Activity--Able to climb a flight of stairs with no CP or SOB  Anesthesia review: HTN,DM , non obs.CAD, C/O cough HA, CP that comes and goes 7-8 pain lasting 1-2 minutes  center of chest .Also vomiting /dry heaving 3 days  Harlene ward PA-C aware pt. Advised to go to urgent care or ER for evaluation.   Patient denies shortness of breath, fever, cough and chest pain at PAT appointment   All instructions explained to the patient, with a verbal understanding of the material. Patient agrees to go over the instructions while at home for a better understanding. Patient also instructed to self quarantine after being tested for COVID-19. The opportunity to ask questions was provided.

## 2023-12-08 ENCOUNTER — Other Ambulatory Visit: Payer: Self-pay

## 2023-12-08 ENCOUNTER — Inpatient Hospital Stay (HOSPITAL_BASED_OUTPATIENT_CLINIC_OR_DEPARTMENT_OTHER)
Admission: EM | Admit: 2023-12-08 | Discharge: 2024-01-02 | DRG: 314 | Disposition: A | Discharge status: Skilled Nursing Facility | Source: Ambulatory Visit | Attending: Internal Medicine | Admitting: Internal Medicine

## 2023-12-08 ENCOUNTER — Encounter (HOSPITAL_COMMUNITY): Payer: Self-pay | Admitting: Medical

## 2023-12-08 ENCOUNTER — Emergency Department (HOSPITAL_BASED_OUTPATIENT_CLINIC_OR_DEPARTMENT_OTHER)

## 2023-12-08 ENCOUNTER — Encounter (HOSPITAL_COMMUNITY)
Admission: RE | Admit: 2023-12-08 | Discharge: 2023-12-08 | Disposition: A | Discharge status: Home / Self Care | Source: Ambulatory Visit | Attending: Orthopedic Surgery | Admitting: Orthopedic Surgery

## 2023-12-08 ENCOUNTER — Encounter (HOSPITAL_COMMUNITY): Payer: Self-pay | Admitting: Physician Assistant

## 2023-12-08 ENCOUNTER — Encounter (HOSPITAL_COMMUNITY): Payer: Self-pay

## 2023-12-08 ENCOUNTER — Emergency Department (HOSPITAL_BASED_OUTPATIENT_CLINIC_OR_DEPARTMENT_OTHER): Admitting: Radiology

## 2023-12-08 VITALS — Temp 98.9°F

## 2023-12-08 DIAGNOSIS — M160 Bilateral primary osteoarthritis of hip: Secondary | ICD-10-CM | POA: Diagnosis not present

## 2023-12-08 DIAGNOSIS — Z87442 Personal history of urinary calculi: Secondary | ICD-10-CM

## 2023-12-08 DIAGNOSIS — R072 Precordial pain: Secondary | ICD-10-CM | POA: Diagnosis not present

## 2023-12-08 DIAGNOSIS — E16A2 Hypoglycemia level 2: Secondary | ICD-10-CM | POA: Diagnosis not present

## 2023-12-08 DIAGNOSIS — Z800 Family history of malignant neoplasm of digestive organs: Secondary | ICD-10-CM

## 2023-12-08 DIAGNOSIS — I4892 Unspecified atrial flutter: Secondary | ICD-10-CM | POA: Diagnosis not present

## 2023-12-08 DIAGNOSIS — Z7984 Long term (current) use of oral hypoglycemic drugs: Secondary | ICD-10-CM

## 2023-12-08 DIAGNOSIS — D72829 Elevated white blood cell count, unspecified: Secondary | ICD-10-CM | POA: Diagnosis present

## 2023-12-08 DIAGNOSIS — Z801 Family history of malignant neoplasm of trachea, bronchus and lung: Secondary | ICD-10-CM

## 2023-12-08 DIAGNOSIS — Z9889 Other specified postprocedural states: Secondary | ICD-10-CM | POA: Diagnosis not present

## 2023-12-08 DIAGNOSIS — I480 Paroxysmal atrial fibrillation: Secondary | ICD-10-CM | POA: Diagnosis not present

## 2023-12-08 DIAGNOSIS — E1165 Type 2 diabetes mellitus with hyperglycemia: Secondary | ICD-10-CM | POA: Diagnosis present

## 2023-12-08 DIAGNOSIS — M349 Systemic sclerosis, unspecified: Secondary | ICD-10-CM | POA: Diagnosis present

## 2023-12-08 DIAGNOSIS — K521 Toxic gastroenteritis and colitis: Secondary | ICD-10-CM | POA: Diagnosis not present

## 2023-12-08 DIAGNOSIS — I10 Essential (primary) hypertension: Secondary | ICD-10-CM | POA: Diagnosis present

## 2023-12-08 DIAGNOSIS — E876 Hypokalemia: Secondary | ICD-10-CM | POA: Diagnosis present

## 2023-12-08 DIAGNOSIS — J90 Pleural effusion, not elsewhere classified: Secondary | ICD-10-CM

## 2023-12-08 DIAGNOSIS — F319 Bipolar disorder, unspecified: Secondary | ICD-10-CM | POA: Diagnosis present

## 2023-12-08 DIAGNOSIS — D509 Iron deficiency anemia, unspecified: Secondary | ICD-10-CM | POA: Diagnosis not present

## 2023-12-08 DIAGNOSIS — I300 Acute nonspecific idiopathic pericarditis: Secondary | ICD-10-CM | POA: Diagnosis present

## 2023-12-08 DIAGNOSIS — E1140 Type 2 diabetes mellitus with diabetic neuropathy, unspecified: Secondary | ICD-10-CM | POA: Diagnosis present

## 2023-12-08 DIAGNOSIS — M25551 Pain in right hip: Secondary | ICD-10-CM | POA: Diagnosis not present

## 2023-12-08 DIAGNOSIS — R0789 Other chest pain: Secondary | ICD-10-CM | POA: Diagnosis not present

## 2023-12-08 DIAGNOSIS — E222 Syndrome of inappropriate secretion of antidiuretic hormone: Secondary | ICD-10-CM | POA: Diagnosis not present

## 2023-12-08 DIAGNOSIS — I152 Hypertension secondary to endocrine disorders: Secondary | ICD-10-CM | POA: Diagnosis not present

## 2023-12-08 DIAGNOSIS — R0600 Dyspnea, unspecified: Secondary | ICD-10-CM | POA: Diagnosis not present

## 2023-12-08 DIAGNOSIS — C50412 Malignant neoplasm of upper-outer quadrant of left female breast: Secondary | ICD-10-CM | POA: Diagnosis not present

## 2023-12-08 DIAGNOSIS — Z85828 Personal history of other malignant neoplasm of skin: Secondary | ICD-10-CM

## 2023-12-08 DIAGNOSIS — R2681 Unsteadiness on feet: Secondary | ICD-10-CM | POA: Diagnosis not present

## 2023-12-08 DIAGNOSIS — R918 Other nonspecific abnormal finding of lung field: Secondary | ICD-10-CM | POA: Diagnosis not present

## 2023-12-08 DIAGNOSIS — Z83438 Family history of other disorder of lipoprotein metabolism and other lipidemia: Secondary | ICD-10-CM

## 2023-12-08 DIAGNOSIS — E039 Hypothyroidism, unspecified: Secondary | ICD-10-CM | POA: Diagnosis present

## 2023-12-08 DIAGNOSIS — E538 Deficiency of other specified B group vitamins: Secondary | ICD-10-CM | POA: Diagnosis present

## 2023-12-08 DIAGNOSIS — E890 Postprocedural hypothyroidism: Secondary | ICD-10-CM | POA: Diagnosis present

## 2023-12-08 DIAGNOSIS — M1A9XX1 Chronic gout, unspecified, with tophus (tophi): Secondary | ICD-10-CM | POA: Diagnosis present

## 2023-12-08 DIAGNOSIS — R519 Headache, unspecified: Secondary | ICD-10-CM | POA: Diagnosis present

## 2023-12-08 DIAGNOSIS — Z1152 Encounter for screening for COVID-19: Secondary | ICD-10-CM

## 2023-12-08 DIAGNOSIS — E11649 Type 2 diabetes mellitus with hypoglycemia without coma: Secondary | ICD-10-CM | POA: Diagnosis not present

## 2023-12-08 DIAGNOSIS — T43015A Adverse effect of tricyclic antidepressants, initial encounter: Secondary | ICD-10-CM | POA: Diagnosis present

## 2023-12-08 DIAGNOSIS — M6281 Muscle weakness (generalized): Secondary | ICD-10-CM | POA: Diagnosis not present

## 2023-12-08 DIAGNOSIS — Z9049 Acquired absence of other specified parts of digestive tract: Secondary | ICD-10-CM

## 2023-12-08 DIAGNOSIS — F4310 Post-traumatic stress disorder, unspecified: Secondary | ICD-10-CM | POA: Diagnosis present

## 2023-12-08 DIAGNOSIS — E860 Dehydration: Secondary | ICD-10-CM | POA: Diagnosis not present

## 2023-12-08 DIAGNOSIS — I240 Acute coronary thrombosis not resulting in myocardial infarction: Secondary | ICD-10-CM | POA: Diagnosis not present

## 2023-12-08 DIAGNOSIS — R0602 Shortness of breath: Secondary | ICD-10-CM | POA: Diagnosis not present

## 2023-12-08 DIAGNOSIS — I5032 Chronic diastolic (congestive) heart failure: Secondary | ICD-10-CM | POA: Diagnosis present

## 2023-12-08 DIAGNOSIS — Z6839 Body mass index (BMI) 39.0-39.9, adult: Secondary | ICD-10-CM

## 2023-12-08 DIAGNOSIS — Z833 Family history of diabetes mellitus: Secondary | ICD-10-CM

## 2023-12-08 DIAGNOSIS — K567 Ileus, unspecified: Secondary | ICD-10-CM | POA: Diagnosis not present

## 2023-12-08 DIAGNOSIS — E1159 Type 2 diabetes mellitus with other circulatory complications: Secondary | ICD-10-CM | POA: Diagnosis not present

## 2023-12-08 DIAGNOSIS — Z9842 Cataract extraction status, left eye: Secondary | ICD-10-CM

## 2023-12-08 DIAGNOSIS — J189 Pneumonia, unspecified organism: Secondary | ICD-10-CM | POA: Diagnosis not present

## 2023-12-08 DIAGNOSIS — Z853 Personal history of malignant neoplasm of breast: Secondary | ICD-10-CM

## 2023-12-08 DIAGNOSIS — K219 Gastro-esophageal reflux disease without esophagitis: Secondary | ICD-10-CM | POA: Diagnosis present

## 2023-12-08 DIAGNOSIS — T43225A Adverse effect of selective serotonin reuptake inhibitors, initial encounter: Secondary | ICD-10-CM | POA: Diagnosis present

## 2023-12-08 DIAGNOSIS — L4050 Arthropathic psoriasis, unspecified: Secondary | ICD-10-CM | POA: Diagnosis not present

## 2023-12-08 DIAGNOSIS — E861 Hypovolemia: Secondary | ICD-10-CM | POA: Diagnosis not present

## 2023-12-08 DIAGNOSIS — E871 Hypo-osmolality and hyponatremia: Secondary | ICD-10-CM | POA: Diagnosis present

## 2023-12-08 DIAGNOSIS — Z96611 Presence of right artificial shoulder joint: Secondary | ICD-10-CM | POA: Diagnosis not present

## 2023-12-08 DIAGNOSIS — Z8701 Personal history of pneumonia (recurrent): Secondary | ICD-10-CM

## 2023-12-08 DIAGNOSIS — R0609 Other forms of dyspnea: Secondary | ICD-10-CM | POA: Diagnosis not present

## 2023-12-08 DIAGNOSIS — Z96653 Presence of artificial knee joint, bilateral: Secondary | ICD-10-CM | POA: Diagnosis present

## 2023-12-08 DIAGNOSIS — D500 Iron deficiency anemia secondary to blood loss (chronic): Secondary | ICD-10-CM | POA: Diagnosis not present

## 2023-12-08 DIAGNOSIS — M797 Fibromyalgia: Secondary | ICD-10-CM | POA: Diagnosis not present

## 2023-12-08 DIAGNOSIS — Z90721 Acquired absence of ovaries, unilateral: Secondary | ICD-10-CM

## 2023-12-08 DIAGNOSIS — E66812 Obesity, class 2: Secondary | ICD-10-CM | POA: Diagnosis not present

## 2023-12-08 DIAGNOSIS — F331 Major depressive disorder, recurrent, moderate: Secondary | ICD-10-CM | POA: Diagnosis not present

## 2023-12-08 DIAGNOSIS — Z01812 Encounter for preprocedural laboratory examination: Secondary | ICD-10-CM | POA: Insufficient documentation

## 2023-12-08 DIAGNOSIS — R112 Nausea with vomiting, unspecified: Secondary | ICD-10-CM | POA: Diagnosis not present

## 2023-12-08 DIAGNOSIS — Z794 Long term (current) use of insulin: Secondary | ICD-10-CM | POA: Diagnosis not present

## 2023-12-08 DIAGNOSIS — Z7982 Long term (current) use of aspirin: Secondary | ICD-10-CM

## 2023-12-08 DIAGNOSIS — Z87891 Personal history of nicotine dependence: Secondary | ICD-10-CM

## 2023-12-08 DIAGNOSIS — Z79899 Other long term (current) drug therapy: Secondary | ICD-10-CM

## 2023-12-08 DIAGNOSIS — M4316 Spondylolisthesis, lumbar region: Secondary | ICD-10-CM | POA: Diagnosis not present

## 2023-12-08 DIAGNOSIS — R062 Wheezing: Secondary | ICD-10-CM | POA: Diagnosis not present

## 2023-12-08 DIAGNOSIS — Z79890 Hormone replacement therapy (postmenopausal): Secondary | ICD-10-CM

## 2023-12-08 DIAGNOSIS — G4733 Obstructive sleep apnea (adult) (pediatric): Secondary | ICD-10-CM | POA: Diagnosis present

## 2023-12-08 DIAGNOSIS — R079 Chest pain, unspecified: Secondary | ICD-10-CM | POA: Diagnosis not present

## 2023-12-08 DIAGNOSIS — R059 Cough, unspecified: Secondary | ICD-10-CM | POA: Diagnosis not present

## 2023-12-08 DIAGNOSIS — M62838 Other muscle spasm: Secondary | ICD-10-CM | POA: Diagnosis not present

## 2023-12-08 DIAGNOSIS — Z8249 Family history of ischemic heart disease and other diseases of the circulatory system: Secondary | ICD-10-CM

## 2023-12-08 DIAGNOSIS — Z90710 Acquired absence of both cervix and uterus: Secondary | ICD-10-CM

## 2023-12-08 DIAGNOSIS — I3139 Other pericardial effusion (noninflammatory): Secondary | ICD-10-CM | POA: Diagnosis not present

## 2023-12-08 DIAGNOSIS — M069 Rheumatoid arthritis, unspecified: Secondary | ICD-10-CM | POA: Diagnosis not present

## 2023-12-08 DIAGNOSIS — I319 Disease of pericardium, unspecified: Secondary | ICD-10-CM | POA: Diagnosis not present

## 2023-12-08 DIAGNOSIS — E7800 Pure hypercholesterolemia, unspecified: Secondary | ICD-10-CM | POA: Diagnosis present

## 2023-12-08 DIAGNOSIS — I517 Cardiomegaly: Secondary | ICD-10-CM | POA: Diagnosis not present

## 2023-12-08 DIAGNOSIS — Z923 Personal history of irradiation: Secondary | ICD-10-CM

## 2023-12-08 DIAGNOSIS — M810 Age-related osteoporosis without current pathological fracture: Secondary | ICD-10-CM | POA: Diagnosis present

## 2023-12-08 DIAGNOSIS — Z981 Arthrodesis status: Secondary | ICD-10-CM | POA: Diagnosis not present

## 2023-12-08 DIAGNOSIS — Z9841 Cataract extraction status, right eye: Secondary | ICD-10-CM

## 2023-12-08 DIAGNOSIS — Z01818 Encounter for other preprocedural examination: Secondary | ICD-10-CM

## 2023-12-08 DIAGNOSIS — T504X5A Adverse effect of drugs affecting uric acid metabolism, initial encounter: Secondary | ICD-10-CM | POA: Diagnosis not present

## 2023-12-08 DIAGNOSIS — E038 Other specified hypothyroidism: Secondary | ICD-10-CM | POA: Diagnosis not present

## 2023-12-08 LAB — SURGICAL PCR SCREEN
MRSA, PCR: NEGATIVE
Staphylococcus aureus: NEGATIVE

## 2023-12-08 LAB — BASIC METABOLIC PANEL WITH GFR
Anion gap: 16 — ABNORMAL HIGH (ref 5–15)
BUN: 11 mg/dL (ref 8–23)
CO2: 24 mmol/L (ref 22–32)
Calcium: 9.5 mg/dL (ref 8.9–10.3)
Chloride: 90 mmol/L — ABNORMAL LOW (ref 98–111)
Creatinine, Ser: 0.71 mg/dL (ref 0.44–1.00)
GFR, Estimated: >60 mL/min (ref >60)
Glucose, Bld: 169 mg/dL — ABNORMAL HIGH (ref 70–99)
Potassium: 3.7 mmol/L (ref 3.5–5.1)
Sodium: 129 mmol/L — ABNORMAL LOW (ref 135–145)

## 2023-12-08 LAB — HEPATIC FUNCTION PANEL
ALT: 12 U/L (ref 0–44)
AST: 19 U/L (ref 15–41)
Albumin: 3.8 g/dL (ref 3.5–5.0)
Alkaline Phosphatase: 105 U/L (ref 38–126)
Bilirubin, Direct: 0.2 mg/dL (ref 0.0–0.2)
Indirect Bilirubin: 0.2 mg/dL — ABNORMAL LOW (ref 0.3–0.9)
Total Bilirubin: 0.4 mg/dL (ref 0.0–1.2)
Total Protein: 7.1 g/dL (ref 6.5–8.1)

## 2023-12-08 LAB — CBC
HCT: 31.9 % — ABNORMAL LOW (ref 36.0–46.0)
Hemoglobin: 10.5 g/dL — ABNORMAL LOW (ref 12.0–15.0)
MCH: 27.6 pg (ref 26.0–34.0)
MCHC: 32.9 g/dL (ref 30.0–36.0)
MCV: 83.7 fL (ref 80.0–100.0)
Platelets: 466 K/uL — ABNORMAL HIGH (ref 150–400)
RBC: 3.81 MIL/uL — ABNORMAL LOW (ref 3.87–5.11)
RDW: 15.2 % (ref 11.5–15.5)
WBC: 14.1 K/uL — ABNORMAL HIGH (ref 4.0–10.5)
nRBC: 0 % (ref 0.0–0.2)

## 2023-12-08 LAB — TROPONIN T, HIGH SENSITIVITY
Troponin T High Sensitivity: 29 ng/L — ABNORMAL HIGH (ref 0–19)
Troponin T High Sensitivity: 28 ng/L — ABNORMAL HIGH (ref 0–19)

## 2023-12-08 LAB — GLUCOSE, CAPILLARY: Glucose-Capillary: 160 mg/dL — ABNORMAL HIGH (ref 70–99)

## 2023-12-08 LAB — PRO BRAIN NATRIURETIC PEPTIDE: Pro Brain Natriuretic Peptide: 725 pg/mL — ABNORMAL HIGH (ref <300.0)

## 2023-12-08 LAB — RESP PANEL BY RT-PCR (RSV, FLU A&B, COVID)  RVPGX2
Influenza A by PCR: NEGATIVE
Influenza B by PCR: NEGATIVE
Resp Syncytial Virus by PCR: NEGATIVE
SARS Coronavirus 2 by RT PCR: NEGATIVE

## 2023-12-08 MED ORDER — SERTRALINE HCL 100 MG PO TABS
100.0000 mg | ORAL_TABLET | Freq: Every day | ORAL | Status: DC
  Administered 2023-12-08 – 2023-12-11 (×4): 100 mg via ORAL
  Filled 2023-12-08 (×4): qty 1

## 2023-12-08 MED ORDER — OXYBUTYNIN CHLORIDE ER 10 MG PO TB24
10.0000 mg | ORAL_TABLET | Freq: Every day | ORAL | Status: DC
  Administered 2023-12-09 – 2023-12-23 (×16): 10 mg via ORAL
  Filled 2023-12-08 (×19): qty 1

## 2023-12-08 MED ORDER — ACETAMINOPHEN 325 MG PO TABS
650.0000 mg | ORAL_TABLET | Freq: Once | ORAL | Status: AC
  Administered 2023-12-08: 650 mg via ORAL
  Filled 2023-12-08: qty 2

## 2023-12-08 MED ORDER — ALLOPURINOL 300 MG PO TABS
300.0000 mg | ORAL_TABLET | Freq: Every day | ORAL | Status: DC
  Administered 2023-12-09 – 2024-01-02 (×24): 300 mg via ORAL
  Filled 2023-12-08 (×24): qty 1

## 2023-12-08 MED ORDER — SODIUM CHLORIDE 0.9 % IV SOLN: INTRAVENOUS | Status: AC

## 2023-12-08 MED ORDER — ACETAMINOPHEN 325 MG PO TABS
650.0000 mg | ORAL_TABLET | Freq: Four times a day (QID) | ORAL | Status: DC | PRN
  Administered 2023-12-09 – 2023-12-21 (×11): 650 mg via ORAL
  Filled 2023-12-08 (×11): qty 2

## 2023-12-08 MED ORDER — INSULIN ASPART 100 UNIT/ML IJ SOLN
0.0000 [IU] | Freq: Three times a day (TID) | INTRAMUSCULAR | Status: DC
  Administered 2023-12-10 (×2): 3 [IU] via SUBCUTANEOUS
  Administered 2023-12-11: 5 [IU] via SUBCUTANEOUS
  Administered 2023-12-11: 3 [IU] via SUBCUTANEOUS
  Administered 2023-12-12 (×3): 2 [IU] via SUBCUTANEOUS
  Administered 2023-12-13: 8 [IU] via SUBCUTANEOUS
  Administered 2023-12-13: 2 [IU] via SUBCUTANEOUS
  Administered 2023-12-14 – 2023-12-15 (×4): 3 [IU] via SUBCUTANEOUS
  Administered 2023-12-15 – 2023-12-16 (×2): 2 [IU] via SUBCUTANEOUS
  Administered 2023-12-16: 5 [IU] via SUBCUTANEOUS
  Administered 2023-12-17: 3 [IU] via SUBCUTANEOUS
  Administered 2023-12-17 – 2023-12-18 (×2): 2 [IU] via SUBCUTANEOUS
  Administered 2023-12-21 – 2023-12-22 (×2): 3 [IU] via SUBCUTANEOUS
  Administered 2023-12-22 – 2023-12-24 (×5): 2 [IU] via SUBCUTANEOUS
  Administered 2023-12-24 (×2): 3 [IU] via SUBCUTANEOUS
  Administered 2023-12-25 – 2023-12-27 (×4): 2 [IU] via SUBCUTANEOUS
  Administered 2023-12-27: 3 [IU] via SUBCUTANEOUS
  Administered 2023-12-28 – 2023-12-30 (×6): 2 [IU] via SUBCUTANEOUS
  Administered 2023-12-31: 3 [IU] via SUBCUTANEOUS
  Administered 2023-12-31 – 2024-01-01 (×3): 2 [IU] via SUBCUTANEOUS

## 2023-12-08 MED ORDER — PANTOPRAZOLE SODIUM 40 MG PO TBEC
40.0000 mg | DELAYED_RELEASE_TABLET | Freq: Every day | ORAL | Status: DC
  Administered 2023-12-09 – 2023-12-12 (×4): 40 mg via ORAL
  Filled 2023-12-08 (×4): qty 1

## 2023-12-08 MED ORDER — IOHEXOL 350 MG/ML SOLN
75.0000 mL | Freq: Once | INTRAVENOUS | Status: AC | PRN
  Administered 2023-12-08: 75 mL via INTRAVENOUS

## 2023-12-08 MED ORDER — PRAVASTATIN SODIUM 10 MG PO TABS
20.0000 mg | ORAL_TABLET | Freq: Every day | ORAL | Status: DC
  Administered 2023-12-08 – 2024-01-01 (×25): 20 mg via ORAL
  Filled 2023-12-08: qty 2
  Filled 2023-12-08: qty 1
  Filled 2023-12-08 (×2): qty 2
  Filled 2023-12-08: qty 1
  Filled 2023-12-08 (×9): qty 2
  Filled 2023-12-08: qty 1
  Filled 2023-12-08 (×5): qty 2
  Filled 2023-12-08: qty 1
  Filled 2023-12-08 (×2): qty 2
  Filled 2023-12-08: qty 1
  Filled 2023-12-08: qty 2

## 2023-12-08 MED ORDER — ACETAMINOPHEN 650 MG RE SUPP: 650.0000 mg | Freq: Four times a day (QID) | RECTAL | Status: DC | PRN

## 2023-12-08 MED ORDER — ONDANSETRON HCL 4 MG/2ML IJ SOLN
4.0000 mg | Freq: Four times a day (QID) | INTRAMUSCULAR | Status: DC | PRN
  Administered 2023-12-17 – 2023-12-21 (×2): 4 mg via INTRAVENOUS
  Filled 2023-12-08 (×2): qty 2

## 2023-12-08 MED ORDER — BENZONATATE 100 MG PO CAPS
100.0000 mg | ORAL_CAPSULE | Freq: Once | ORAL | Status: AC
  Administered 2023-12-08: 100 mg via ORAL
  Filled 2023-12-08: qty 1

## 2023-12-08 MED ORDER — LAMOTRIGINE 100 MG PO TABS
200.0000 mg | ORAL_TABLET | Freq: Every day | ORAL | Status: DC
  Administered 2023-12-09 – 2024-01-01 (×24): 200 mg via ORAL
  Filled 2023-12-08 (×26): qty 2

## 2023-12-08 MED ORDER — LEVOTHYROXINE SODIUM 50 MCG PO TABS
50.0000 ug | ORAL_TABLET | Freq: Every day | ORAL | Status: DC
  Administered 2023-12-09 – 2024-01-02 (×25): 50 ug via ORAL
  Filled 2023-12-08 (×26): qty 1

## 2023-12-08 MED ORDER — DOXEPIN HCL 10 MG PO CAPS
10.0000 mg | ORAL_CAPSULE | Freq: Every day | ORAL | Status: DC
  Administered 2023-12-09 – 2023-12-23 (×15): 10 mg via ORAL
  Filled 2023-12-08 (×16): qty 1

## 2023-12-08 MED ORDER — ONDANSETRON HCL 4 MG PO TABS
4.0000 mg | ORAL_TABLET | Freq: Four times a day (QID) | ORAL | Status: DC | PRN
  Administered 2023-12-20: 4 mg via ORAL
  Filled 2023-12-08: qty 1

## 2023-12-08 NOTE — ED Provider Notes (Signed)
 [place] EMERGENCY DEPARTMENT AT [facility] Provider Note   CSN: [id number] Arrival date & time: 12/08/23  1526     Patient presents with: Chest Pain   Brittany Acosta is a 73 y.o. female.   Patient with history of high cholesterol, hypertension, diabetes, rheumatoid arthritis, breast cancer, scheduled to have shoulder surgery in 1 week --presents to the emergency department today for evaluation of chest pain and cough.  Patient was encouraged to come to the emergency department during a preop clearance visit today for her shoulder surgery next week.  Patient reports 2-day history of mid chest pain that then radiates up to her neck and makes it feel like she is choking.  She will then have some dry heaves, coughing, and a headache.  Symptoms will last for a few minutes and then resolved but returned.  She has been having shortness of breath, especially with exertion.  She reports some lower extremity swelling bilaterally.  She is not having difficulty breathing when she lays flat.  No associated vomiting, diaphoresis.  Patient had a coronary calcium  score, 32nd percentile, in the summer.  She also had an echo which showed normal EF.       Prior to Admission medications   Medication Sig Start Date End Date Taking? Authorizing Provider  allopurinol  (ZYLOPRIM ) 300 MG tablet Take 1 tablet (300 mg total) by mouth daily. 11/22/23   [name], MD  aspirin  EC 81 MG tablet Take 1 tablet (81 mg total) by mouth daily. Swallow whole. 01/25/20   Lonni Slain, MD  Biotin  10000 MCG TABS Take 10,000 mcg by mouth at bedtime.    [provider]  BLACK ELDERBERRY,BERRY-FLOWER, PO Take 1 capsule by mouth in the morning.    [provider]  Blood Glucose Monitoring Suppl (ONETOUCH VERIO) w/Device KIT Use as directed to monitor glucose daily and as needed 06/14/23   Webb, Padonda B, FNP  cetirizine (ZYRTEC) 10 MG tablet Take 20 mg by mouth in the morning.    [provider]  Cholecalciferol  (VITAMIN D3) 125 MCG (5000 UT) CAPS Take 5,000 Units by mouth at bedtime.    [provider]  CINNAMON PO Take 2,000 mg by mouth in the morning.    [provider]  cyclobenzaprine  (FLEXERIL ) 10 MG tablet Take 1 tablet (10 mg) by mouth at bedtime as needed for muscle spasms. 11/22/23   [name], MD  doxepin  (SINEQUAN ) 10 MG capsule Take 1 capsule (10 mg total) by mouth daily. 11/22/23   [name], MD  ferrous sulfate 325 (65 FE) MG tablet Take 325 mg by mouth in the morning.    [provider]  fluticasone  (FLONASE ) 50 MCG/ACT nasal spray Place 2 sprays into both nostrils daily. Patient taking differently: Place 2 sprays into both nostrils daily as needed for allergies. 11/28/23   [name], MD  folic acid  (FOLVITE ) 1 MG tablet Take 2 tablets (2 mg total) by mouth in the morning. 11/22/23   [name], MD  furosemide  (LASIX ) 40 MG tablet Take 1 tablet (40 mg total) by mouth daily. 02/08/23   [name], MD  glucose blood (TRUE METRIX BLOOD GLUCOSE TEST) test strip TEST BLOOD SUGAR EVERY DAY AND AS NEEDED 06/11/23   Webb, Padonda B, FNP  Homeopathic Products (EARACHE DROPS OT) Place 1-2 drops in ear(s) 3 (three) times daily as needed (ear pain.).    [provider]  insulin  degludec (TRESIBA  FLEXTOUCH) 100 UNIT/ML  FlexTouch Pen Inject 26 Units into the skin in the morning. Patient taking differently: Inject 23 Units into the skin in the morning. 11/22/23   [name], MD  Insulin  Pen Needle (DROPLET PEN NEEDLES) 31G X 5 MM MISC USE TO INJECT INSULIN  DAILY 03/19/20   Job Lukes, PA  lamoTRIgine  (LAMICTAL ) 200 MG tablet TAKE 1 TABLET (200 MG TOTAL) BY MOUTH DAILY AT 12 NOON. 02/08/23   [name], MD  Lancets [facility] DELICA PLUS [place]) MISC Use as directed to monitor glucose daily and as needed. 06/14/23   Webb, Padonda B, FNP  levothyroxine  (SYNTHROID ) 50 MCG tablet Take 1 tablet (50 mcg  total) by mouth daily. 12/12/22   [name], MD  lisinopril  (ZESTRIL ) 5 MG tablet Take 1 tablet (5 mg total) by mouth daily. 02/08/23   [name], MD  magnesium  oxide (MAG-OX) 400 (240 Mg) MG tablet Take 400 mg by mouth in the morning.    [provider]  metFORMIN  (GLUCOPHAGE ) 500 MG tablet Take 2 tablets (1,000 mg total) by mouth every morning AND 1 tablet (500 mg total) every evening. 11/22/23   [name], MD  omeprazole  (PRILOSEC) 20 MG capsule TAKE 1 CAPSULE (20 MG TOTAL) BY MOUTH DAILY. TAKE 30 MINUTES BEFORE A MEAL Patient taking differently: Take 20 mg by mouth daily at 12 noon. 02/08/23   [name], MD  ondansetron  (ZOFRAN -ODT) 4 MG disintegrating tablet Take 1 tablet (4 mg total) by mouth every 4 (four) hours as needed for nausea/vomiting 11/18/22   [name], MD  oxybutynin  (DITROPAN -XL) 10 MG 24 hr tablet Take 1 tablet (10 mg total) by mouth at bedtime. 11/22/23   [name], MD  Polyethyl Glycol-Propyl Glycol (SYSTANE ULTRA) 0.4-0.3 % SOLN Place 1-2 drops into both eyes 3 (three) times daily as needed (dry/irritated eyes).    [provider]  potassium chloride  SA (KLOR-CON  M) 20 MEQ tablet Take 1 tablet (20 mEq total) by mouth daily. Patient taking differently: Take 20 mEq by mouth every evening. 11/22/23   [name], MD  pravastatin  (PRAVACHOL ) 20 MG tablet Take 1 tablet (20 mg total) by mouth at bedtime. 11/22/23   [name], MD  sertraline  (ZOLOFT ) 100 MG tablet Take 1 tablet (100 mg total) by mouth at bedtime. 10/30/23   [name], MD  sulfaSALAzine  (AZULFIDINE ) 500 MG tablet Take 2 tablets (1,000 mg total) by mouth 2 (two) times daily. 09/20/23     tamsulosin  (FLOMAX ) 0.4 MG CAPS capsule Take 1 capsule (0.4 mg total) by mouth at bedtime. 11/22/23   [name], MD    Allergies: Patient has no known allergies.    Review of Systems  Updated Vital Signs BP (!) 142/61   Pulse 97   Temp (!) 97.5 F (36.4  C) (Oral)   Resp 20   Ht 5' 1.5 (1.562 m)   Wt 97.1 kg   SpO2 98%   BMI 39.78 kg/m   Physical Exam Vitals and nursing note reviewed.  Constitutional:      Appearance: She is well-developed. She is not diaphoretic.  HENT:     Head: Normocephalic and atraumatic.     Mouth/Throat:     Mouth: Mucous membranes are not dry.  Eyes:     Conjunctiva/sclera: Conjunctivae normal.  Neck:     Vascular: Normal carotid pulses. No JVD.     Trachea: Trachea normal. No tracheal deviation.  Cardiovascular:     Rate  and Rhythm: Normal rate. Rhythm irregular.     Pulses: No decreased pulses.          Radial pulses are 2+ on the right side and 2+ on the left side.     Heart sounds: Normal heart sounds, S1 normal and S2 normal. No murmur heard. Pulmonary:     Effort: Pulmonary effort is normal. No respiratory distress.     Breath sounds: No wheezing.  Chest:     Chest wall: No tenderness.  Abdominal:     General: Bowel sounds are normal.     Palpations: Abdomen is soft.     Tenderness: There is no abdominal tenderness. There is no guarding or rebound.  Musculoskeletal:        General: Normal range of motion.     Cervical back: Normal range of motion and neck supple. No muscular tenderness.  Skin:    General: Skin is warm and dry.     Coloration: Skin is not pale.  Neurological:     Mental Status: She is alert.     (all labs ordered are listed, but only abnormal results are displayed) Labs Reviewed  BASIC METABOLIC PANEL WITH GFR - Abnormal; Notable for the following components:      Result Value   Sodium 129 (*)    Chloride 90 (*)    Glucose, Bld 169 (*)    Anion gap 16 (*)    All other components within normal limits  CBC - Abnormal; Notable for the following components:   WBC 14.1 (*)    RBC 3.81 (*)    Hemoglobin 10.5 (*)    HCT 31.9 (*)    Platelets 466 (*)    All other components within normal limits  PRO BRAIN NATRIURETIC PEPTIDE - Abnormal; Notable for the following  components:   Pro Brain Natriuretic Peptide 725.0 (*)    All other components within normal limits  HEPATIC FUNCTION PANEL - Abnormal; Notable for the following components:   Indirect Bilirubin 0.2 (*)    All other components within normal limits  TROPONIN T, HIGH SENSITIVITY - Abnormal; Notable for the following components:   Troponin T High Sensitivity 29 (*)    All other components within normal limits  TROPONIN T, HIGH SENSITIVITY - Abnormal; Notable for the following components:   Troponin T High Sensitivity 28 (*)    All other components within normal limits  RESP PANEL BY RT-PCR (RSV, FLU A&B, COVID)  [id number]    EKG: EKG Interpretation Date/Time:  Thursday December 08 2023 15:44:14 EDT Ventricular Rate:  100 PR Interval:  130 QRS Duration:  134 QT Interval:  358 QTC Calculation: 461 R Axis:   -45  Text Interpretation: Normal sinus rhythm Right bundle branch block Left anterior fascicular block Bifascicular block Abnormal ECG When compared with ECG of 27-May-2023 14:54, No significant change was found Confirmed by Zackowski, Scott [phone]) on 12/08/2023 4:19:33 PM  Radiology: CT Angio Chest PE W and/or Wo Contrast Result Date: 12/08/2023 CLINICAL DATA:  High probability for PE.  Chest pain. EXAM: CT ANGIOGRAPHY CHEST WITH CONTRAST TECHNIQUE: Multidetector CT imaging of the chest was performed using the standard protocol during bolus administration of intravenous contrast. Multiplanar CT image reconstructions and MIPs were obtained to evaluate the vascular anatomy. RADIATION DOSE REDUCTION: This exam was performed according to the departmental dose-optimization program which includes automated exposure control, adjustment of the mA and/or kV according to patient size and/or use of iterative reconstruction technique. CONTRAST:  75mL OMNIPAQUE   IOHEXOL  350 MG/ML SOLN COMPARISON:  None Available. FINDINGS: Cardiovascular: Satisfactory opacification of the pulmonary arteries to the segmental  level. No evidence of pulmonary embolism. Normal heart size. There is a moderate-to-large sized pericardial effusion. Mediastinum/Nodes: No enlarged mediastinal, hilar, or axillary lymph nodes. Thyroid  gland, trachea, and esophagus demonstrate no significant findings. Lungs/Pleura: There is a trace left pleural effusion. There is minimal atelectasis in the left lower lobe. The lungs are otherwise clear. There is no pneumothorax. Upper Abdomen: No acute abnormality. Cholecystectomy clips are present. Musculoskeletal: There severe degenerative changes of the left shoulder. Right shoulder arthroplasty is present. Review of the MIP images confirms the above findings. IMPRESSION: 1. No evidence for pulmonary embolism. 2. Moderate-to-large sized pericardial effusion. 3. Trace left pleural effusion. Electronically Signed   By: Greig Pique M.D.   On: 12/08/2023 19:03   DG Chest 2 View Result Date: 12/08/2023 CLINICAL DATA:  Chest pain. EXAM: CHEST - 2 VIEW COMPARISON:  Chest radiograph dated 01/29/2021. FINDINGS: No focal consolidation, pleural effusion or pneumothorax. Top-normal cardiac size. No acute osseous pathology. Over shoulder arthroplasty. IMPRESSION: No active cardiopulmonary disease. Electronically Signed   By: Vanetta Chou M.D.   On: 12/08/2023 17:23     Procedures   Medications Ordered in the ED  iohexol  (OMNIPAQUE ) 350 MG/ML injection 75 mL (75 mLs Intravenous Contrast Given 12/08/23 1824)    ED Course  Patient seen and examined. History obtained directly from patient.  Reviewed previous cardiology workup and notes.  Labs/EKG: Personally reviewed and interpreted lab work ordered in triage including CBC with elevated white blood cell count, hemoglobin was low at 10.5 but near patient's baseline; BMP with sodium slightly low at 129 but chronic, chloride 90, glucose 169 with anion gap slightly elevated at 16 but normal bicarbonate; troponin elevated at 29.  Added BNP, hepatic function panel,  COVID testing.  Imaging: Chest x-ray ordered in triage, pending.  Medications/Fluids: None ordered.  Most recent vital signs reviewed and are as follows: BP (!) 142/61   Pulse 97   Temp (!) 97.5 F (36.4 C) (Oral)   Resp 20   Ht 5' 1.5 (1.562 m)   Wt 97.1 kg   SpO2 98%   BMI 39.78 kg/m   Initial impression: Chest pain with cough, radiation to the neck, dry heaves and headache.  Also shortness of breath.  Wide differential.  Will evaluate for infectious etiology, ACS, PE.  7:23 PM Reassessment performed. Patient appears stable during several rechecks.  Labs personally reviewed and interpreted including: BNP elevated at 725, intermediate; troponin 29 > 28.   Imaging personally visualized and interpreted including: CTA of the chest was ordered to evaluate for pneumonia and blood clot, this shows moderate to large pericardial effusion.  Reviewed pertinent lab work and imaging with patient at bedside. Questions answered.   Most current vital signs reviewed and are as follows: BP (!) 142/61   Pulse 97   Temp (!) 97.5 F (36.4 C) (Oral)   Resp 20   Ht 5' 1.5 (1.562 m)   Wt 97.1 kg   SpO2 98%   BMI 39.78 kg/m   Plan: Discussed with Dr. Zackowski.  Will discuss with on-call cardiology.  Anticipate admission to the hospital for further evaluation given patient's symptoms.  8:50 PM Discussed case with Dr. Francyne with cardiology.  Agrees with admission, recommends echocardiogram.  Agrees with medicine admit, they will consult.  Discussed case with Dr. Alfornia with [facility], accepts for admission.  Medical Decision Making Amount and/or Complexity of Data Reviewed Labs: ordered. Radiology: ordered.  Risk Prescription drug management. Decision regarding hospitalization.   Patient with chest pain and shortness of breath, found to have worsening pericardial effusion.  Currently stable with vitals stable.  She is symptomatic.  Mildly elevated  troponin, flat.  Mildly elevated BNP.  She will be admitted for further workup and treatment.     Final diagnoses:  Pericardial effusion  Precordial pain    ED Discharge Orders     None          Desiderio Chew, DEVONNA 12/08/23 2052    Geraldene Hamilton, MD 12/09/23 [phone]

## 2023-12-08 NOTE — Plan of Care (Signed)
 Plan of Care Note for accepted transfer   Patient name: Brittany Acosta FMW:[id number] DOB: 05-14-1950  Facility requesting transfer: [facility] Requesting Provider: Desiderio Chew, PA-C  Reason for transfer: Pericardial effusion Facility course: 73 year old female with history of hypertension, hyperlipidemia, insulin -dependent type 2 diabetes, scleroderma, hypothyroidism, gout, psoriatic arthropathy, rheumatoid arthritis, iron and B12 deficiency, GERD.  Patient is scheduled to have shoulder surgery in a week and presented to the ED for evaluation of chest pain, shortness of breath, and cough x 2 days.  Slightly tachycardic but afebrile and not tachypneic, hypotensive, or hypoxic.  EKG without acute ischemic changes.  WBC count 14.1, hemoglobin 10.5 (close to baseline), sodium 129 (previously 133 on labs 2 weeks ago), troponin 29> 28, COVID/influenza/RSV PCR negative, proBNP 725.  CTA chest negative for PE but showing moderate to large sized pericardial effusion and trace left pleural effusion.  ED PA discussed the case with Dr. Francyne who requested admission by medicine service for echocardiogram in the morning and cardiology will consult.  Plan of care: The patient is accepted for admission to Progressive unit at [facility].  [facility] will assume care on arrival to accepting facility. Until arrival, care as per EDP. However, [facility] available 24/7 for questions and assistance.  Check [email or web address] for on-call coverage.  Nursing staff, please call [facility] Admits & Consults System-Wide number under Amion on patient's arrival so appropriate admitting provider can evaluate the pt.

## 2023-12-08 NOTE — ED Triage Notes (Signed)
 Pt caox4 c/o cough, CP, and vomiting x2 days. Pt reports she went to [facility] for pre-op appt today and was told to go to ED for CXR and 12 lead ECG.

## 2023-12-08 NOTE — H&P (Signed)
 [facility] History and Physical  ASPEN LAWRANCE FMW:[id number] DOB: [birth year] DOA: 12/08/2023   PCP: [name], MD  Specialists: Orthopedics  Chief Complaint: Shortness of breath ongoing for few days  HPI: Brittany Acosta is a 73 y.o. female with a past medical history of diabetes mellitus type 2 on insulin , essential hypertension, Psoriatec arthropathy, history of gout, history of breast cancer, hyperlipidemia who was in her usual state of health till about 2 to 3 days ago when she started developing shortness of breath along with chest pain.  Pain was located in the mid chest without any radiation.  Currently she denies any chest pain.  Also reported some dry heaves.  Denies any fever or chills.  No illness recently.  Denies any dizziness or lightheadedness currently.  No syncopal episodes recently.  Has been compliant with her medications.  She takes furosemide  for history of leg edema.  In the emergency department she underwent workup including a CT scan which raise concern for moderate to large pericardial effusion.  Case was discussed with cardiology who recommended hospitalization for further evaluation.  Home Medications: This list is not reconciled yet Prior to Admission medications   Medication Sig Start Date End Date Taking? Authorizing Provider  allopurinol  (ZYLOPRIM ) 300 MG tablet Take 1 tablet (300 mg total) by mouth daily. 11/22/23   [name], MD  aspirin  EC 81 MG tablet Take 1 tablet (81 mg total) by mouth daily. Swallow whole. 01/25/20   Lonni Slain, MD  Biotin  10000 MCG TABS Take 10,000 mcg by mouth at bedtime.    [provider]  BLACK ELDERBERRY,BERRY-FLOWER, PO Take 1 capsule by mouth in the morning.    [provider]  Blood Glucose Monitoring Suppl (ONETOUCH VERIO) w/Device KIT Use as directed to monitor glucose daily and as needed 06/14/23   Webb, Padonda B, FNP  cetirizine (ZYRTEC) 10 MG tablet Take 20 mg by mouth in the  morning.    [provider]  Cholecalciferol  (VITAMIN D3) 125 MCG (5000 UT) CAPS Take 5,000 Units by mouth at bedtime.    [provider]  CINNAMON PO Take 2,000 mg by mouth in the morning.    [provider]  cyclobenzaprine  (FLEXERIL ) 10 MG tablet Take 1 tablet (10 mg) by mouth at bedtime as needed for muscle spasms. 11/22/23   [name], MD  doxepin  (SINEQUAN ) 10 MG capsule Take 1 capsule (10 mg total) by mouth daily. 11/22/23   [name], MD  ferrous sulfate 325 (65 FE) MG tablet Take 325 mg by mouth in the morning.    [provider]  fluticasone  (FLONASE ) 50 MCG/ACT nasal spray Place 2 sprays into both nostrils daily. Patient taking differently: Place 2 sprays into both nostrils daily as needed for allergies. 11/28/23   [name], MD  folic acid  (FOLVITE ) 1 MG tablet Take 2 tablets (2 mg total) by mouth in the morning. 11/22/23   [name], MD  furosemide  (LASIX ) 40 MG tablet Take 1 tablet (40 mg total) by mouth daily. 02/08/23   [name], MD  glucose blood (TRUE METRIX BLOOD GLUCOSE TEST) test strip TEST BLOOD SUGAR EVERY DAY AND AS NEEDED 06/11/23   Webb, Padonda B, FNP  Homeopathic Products (EARACHE DROPS OT) Place 1-2 drops in ear(s) 3 (three) times daily as needed (ear pain.).    [provider]  insulin  degludec (TRESIBA  FLEXTOUCH) 100 UNIT/ML FlexTouch Pen Inject 26 Units into the skin in the  morning. Patient taking differently: Inject 23 Units into the skin in the morning. 11/22/23   [name], MD  Insulin  Pen Needle (DROPLET PEN NEEDLES) 31G X 5 MM MISC USE TO INJECT INSULIN  DAILY 03/19/20   Job Lukes, PA  lamoTRIgine  (LAMICTAL ) 200 MG tablet TAKE 1 TABLET (200 MG TOTAL) BY MOUTH DAILY AT 12 NOON. 02/08/23   [name], MD  Lancets [facility] DELICA PLUS [place]) MISC Use as directed to monitor glucose daily and as needed. 06/14/23   Webb, Padonda B, FNP  levothyroxine  (SYNTHROID ) 50 MCG tablet  Take 1 tablet (50 mcg total) by mouth daily. 12/12/22   [name], MD  lisinopril  (ZESTRIL ) 5 MG tablet Take 1 tablet (5 mg total) by mouth daily. 02/08/23   [name], MD  magnesium  oxide (MAG-OX) 400 (240 Mg) MG tablet Take 400 mg by mouth in the morning.    [provider]  metFORMIN  (GLUCOPHAGE ) 500 MG tablet Take 2 tablets (1,000 mg total) by mouth every morning AND 1 tablet (500 mg total) every evening. 11/22/23   [name], MD  omeprazole  (PRILOSEC) 20 MG capsule TAKE 1 CAPSULE (20 MG TOTAL) BY MOUTH DAILY. TAKE 30 MINUTES BEFORE A MEAL Patient taking differently: Take 20 mg by mouth daily at 12 noon. 02/08/23   [name], MD  ondansetron  (ZOFRAN -ODT) 4 MG disintegrating tablet Take 1 tablet (4 mg total) by mouth every 4 (four) hours as needed for nausea/vomiting 11/18/22   [name], MD  oxybutynin  (DITROPAN -XL) 10 MG 24 hr tablet Take 1 tablet (10 mg total) by mouth at bedtime. 11/22/23   [name], MD  Polyethyl Glycol-Propyl Glycol (SYSTANE ULTRA) 0.4-0.3 % SOLN Place 1-2 drops into both eyes 3 (three) times daily as needed (dry/irritated eyes).    [provider]  potassium chloride  SA (KLOR-CON  M) 20 MEQ tablet Take 1 tablet (20 mEq total) by mouth daily. Patient taking differently: Take 20 mEq by mouth every evening. 11/22/23   [name], MD  pravastatin  (PRAVACHOL ) 20 MG tablet Take 1 tablet (20 mg total) by mouth at bedtime. 11/22/23   [name], MD  sertraline  (ZOLOFT ) 100 MG tablet Take 1 tablet (100 mg total) by mouth at bedtime. 10/30/23   [name], MD  sulfaSALAzine  (AZULFIDINE ) 500 MG tablet Take 2 tablets (1,000 mg total) by mouth 2 (two) times daily. 09/20/23     tamsulosin  (FLOMAX ) 0.4 MG CAPS capsule Take 1 capsule (0.4 mg total) by mouth at bedtime. 11/22/23   [name], MD    Allergies: No Known Allergies  Past Medical History: Past Medical History:  Diagnosis Date   Allergic rhinitis, cause  unspecified    Arthritis    Bipolar disorder, unspecified (HCC)    depression   Cancer (HCC)    skin cancer on left leg, left breast cancer   Cataract    Chronic diastolic heart failure (HCC)    Depression    GERD (gastroesophageal reflux disease)    Headache    History of gout    haven't had it in several years (02/08/2012)   History of kidney stones    History of wrist fracture    rt wrist   Hypertension    Iron deficiency anemia    Morbid obesity (HCC)    Neuropathy due to secondary diabetes (HCC)    Nontoxic multinodular goiter    Osteoporosis    Personal history of radiation therapy  Pneumonia    PONV (postoperative nausea and vomiting)    Psoriatic arthritis (HCC)    PTSD (post-traumatic stress disorder)    Pure hypercholesterolemia    RBBB    Scleroderma (HCC)    Sleep apnea 10/2012   mild osa-did not need cpap -dr clance   Type II diabetes mellitus (HCC)    Unspecified hypothyroidism     Past Surgical History:  Procedure Laterality Date   ABDOMINAL HYSTERECTOMY  1976   BREAST EXCISIONAL BIOPSY Left    BREAST LUMPECTOMY Left 04/25/2018   BREAST LUMPECTOMY WITH RADIOACTIVE SEED AND SENTINEL LYMPH NODE BIOPSY Left 04/25/2018   Procedure: LEFT BREAST LUMPECTOMY WITH RADIOACTIVE SEED AND SENTINEL LYMPH NODE BIOPSY;  Surgeon: Mikell Katz, MD;  Location: [facility] OR;  Service: General;  Laterality: Left;   BREAST SURGERY     CARDIAC CATHETERIZATION  2001   sees Dr Peter [place]   CATARACT EXTRACTION  2014   CATARACT EXTRACTION, BILATERAL  02/2011   epps   CHOLECYSTECTOMY  1985   ESOPHAGOGASTRODUODENOSCOPY (EGD) WITH PROPOFOL  N/A 10/14/2022   Procedure: ESOPHAGOGASTRODUODENOSCOPY (EGD) WITH PROPOFOL ;  Surgeon: Wilhelmenia Aloha Raddle., MD;  Location: [facility];  Service: Gastroenterology;  Laterality: N/A;   EUS N/A 10/14/2022   Procedure: UPPER ENDOSCOPIC ULTRASOUND (EUS) RADIAL;  Surgeon: Wilhelmenia Aloha Raddle., MD;  Location: [facility];  Service:  Gastroenterology;  Laterality: N/A;   EXCISIONAL HEMORRHOIDECTOMY     dr cut out in his office (02/08/2012)   INCISIONAL BREAST BIOPSY  2000   right   JOINT REPLACEMENT     rt knee   KNEE ARTHROSCOPY  08/2009   right   KNEE ARTHROSCOPY WITH MEDIAL MENISECTOMY     left   Left Cystoscopy   1990   LEFT OOPHORECTOMY  1980   Lithotripsy (L) Kidney  1997   LUMBAR LAMINECTOMY/DECOMPRESSION MICRODISCECTOMY Left 07/01/2016   Procedure: Laminectomy for synovial cyst - left - Lumbar four-five;  Surgeon: Victory Gunnels, MD;  Location: [facility] OR;  Service: Neurosurgery;  Laterality: Left;   PARTIAL MASTECTOMY WITH NEEDLE LOCALIZATION Left 09/01/2012   Procedure: PARTIAL MASTECTOMY WITH NEEDLE LOCALIZATION;  Surgeon: Elon CHRISTELLA Pacini, MD;  Location: [facility] OR;  Service: General;  Laterality: Left;   Percitania stone removed (L) Kidney  1992   REVERSE SHOULDER ARTHROPLASTY Right 02/12/2021   Procedure: REVERSE SHOULDER ARTHROPLASTY;  Surgeon: Dozier Soulier, MD;  Location: [facility];  Service: Orthopedics;  Laterality: Right;   Right nasal surgery  08/1988   Right sinus removed  08/1989   tooth partial   ROTATOR CUFF REPAIR  2013   right shoulder x 2   SHOULDER ARTHROSCOPY WITH ROTATOR CUFF REPAIR AND SUBACROMIAL DECOMPRESSION Left 08/20/2013   Procedure: SHOULDER ARTHROSCOPY  AND SUBACROMIAL DECOMPRESSION;  Surgeon: Soulier Elsie Dozier, MD;  Location: [facility];  Service: Orthopedics;  Laterality: Left;  Left shoulder arthroscopy, debridement, subacromial decompression, distal clavical resection   THYROIDECTOMY  04/22/2011   Procedure: THYROIDECTOMY;  Surgeon: Vaughan JONETTA Ricker, MD;  Location: [facility] OR;  Service: ENT;  Laterality: N/A;  TOTAL THYROIDECOTMY   TOTAL KNEE ARTHROPLASTY  06/18/2011   Procedure: TOTAL KNEE ARTHROPLASTY;  Surgeon: Dempsey JINNY Sensor, MD;  Location: [facility];  Service: Orthopedics;  Laterality: Left;  DEPUY   TOTAL KNEE ARTHROPLASTY  02/07/2012   Procedure: TOTAL KNEE  ARTHROPLASTY;  Surgeon: Dempsey JINNY Sensor, MD;  Location: [facility];  Service: Orthopedics;  Laterality: Right;   TUBAL LIGATION  1972    Social History: Lives with her  family.  Denies smoking alcohol use or illicit drug use.  Family History:  Family History  Problem Relation Age of Onset   Hypertension Mother    Lung cancer Mother        non-smoker   Diabetes Father    Hypertension Father    Hyperlipidemia Father    Hypertension Sister    Hypertension Sister    Hypertension Sister    Hypertension Brother    Hypertension Brother    Hypertension Brother    Stomach cancer Maternal Grandmother    Diabetes Daughter    Heart attack Other    Coronary artery disease Other    Colon cancer Neg Hx        unsure   Esophageal cancer Neg Hx    Liver cancer Neg Hx    Pancreatic cancer Neg Hx    Rectal cancer Neg Hx    Colon polyps Neg Hx      Review of Systems - History obtained from the patient General ROS: positive for  - fatigue Psychological ROS: negative Ophthalmic ROS: negative ENT ROS: negative Allergy and Immunology ROS: negative Hematological and Lymphatic ROS: negative Endocrine ROS: negative Respiratory ROS: As in HPI Cardiovascular ROS: As in HPI Gastrointestinal ROS: no abdominal pain, change in bowel habits, or black or bloody stools Genito-Urinary ROS: no dysuria, trouble voiding, or hematuria Musculoskeletal ROS: Arthritis pain in multiple joints Neurological ROS: no TIA or stroke symptoms Dermatological ROS: negative  Physical Examination  Vitals:   12/08/23 1942 12/08/23 2000 12/08/23 2130 12/08/23 2303  BP: 139/70 114/82 (!) 145/83 138/71  Pulse: 99 (!) 102 (!) 101 99  Resp: 18 (!) 21 (!) 25 18  Temp: 98.8 F (37.1 C)   99.1 F (37.3 C)  TempSrc: Oral   Oral  SpO2: 100% 99% 100% 99%  Weight:    94.8 kg  Height:        BP 138/71 (BP Location: Left Wrist)   Pulse 99   Temp 99.1 F (37.3 C) (Oral)   Resp 18   Ht 5' 1.5 (1.562 m)   Wt 94.8 kg   SpO2  99%   BMI 38.85 kg/m   General appearance: alert, cooperative, appears stated age, and no distress Head: Normocephalic, without obvious abnormality, atraumatic Eyes: conjunctivae/corneas clear. PERRL, EOM's intact.  Throat: lips, mucosa, and tongue normal; teeth and gums normal Neck: no adenopathy, no carotid bruit, no JVD, supple, symmetrical, trachea midline, and thyroid  not enlarged, symmetric, no tenderness/mass/nodules Resp: clear to auscultation bilaterally Cardio: regular rate and rhythm, S1, S2 normal, no murmur, click, rub or gallop GI: soft, non-tender; bowel sounds normal; no masses,  no organomegaly Extremities: extremities normal, atraumatic, no cyanosis or edema Pulses: 2+ and symmetric Skin: Skin color, texture, turgor normal. No rashes or lesions Lymph nodes: Cervical, supraclavicular, and axillary nodes normal. Neurologic: Alert and oriented x 3.  No obvious focal neurological deficits noted.   Labs on Admission: I have personally reviewed following labs and imaging studies  CBC: Recent Labs  Lab 12/08/23 1544  WBC 14.1*  HGB 10.5*  HCT 31.9*  MCV 83.7  PLT 466*   Basic Metabolic Panel: Recent Labs  Lab 12/08/23 1544  NA 129*  K 3.7  CL 90*  CO2 24  GLUCOSE 169*  BUN 11  CREATININE 0.71  CALCIUM  9.5   GFR: Estimated Creatinine Clearance: 66.5 mL/min (by C-G formula based on SCr of 0.71 mg/dL). Liver Function Tests: Recent Labs  Lab 12/08/23 1700  AST 19  ALT 12  ALKPHOS 105  BILITOT 0.4  PROT 7.1  ALBUMIN  3.8    BNP (last 3 results) Recent Labs    12/08/23 1700  PROBNP 725.0*    CBG: Recent Labs  Lab 12/08/23 1408  GLUCAP 160*    Radiological Exams on Admission: CT Angio Chest PE W and/or Wo Contrast Result Date: 12/08/2023 CLINICAL DATA:  High probability for PE.  Chest pain. EXAM: CT ANGIOGRAPHY CHEST WITH CONTRAST TECHNIQUE: Multidetector CT imaging of the chest was performed using the standard protocol during bolus  administration of intravenous contrast. Multiplanar CT image reconstructions and MIPs were obtained to evaluate the vascular anatomy. RADIATION DOSE REDUCTION: This exam was performed according to the departmental dose-optimization program which includes automated exposure control, adjustment of the mA and/or kV according to patient size and/or use of iterative reconstruction technique. CONTRAST:  75mL OMNIPAQUE  IOHEXOL  350 MG/ML SOLN COMPARISON:  None Available. FINDINGS: Cardiovascular: Satisfactory opacification of the pulmonary arteries to the segmental level. No evidence of pulmonary embolism. Normal heart size. There is a moderate-to-large sized pericardial effusion. Mediastinum/Nodes: No enlarged mediastinal, hilar, or axillary lymph nodes. Thyroid  gland, trachea, and esophagus demonstrate no significant findings. Lungs/Pleura: There is a trace left pleural effusion. There is minimal atelectasis in the left lower lobe. The lungs are otherwise clear. There is no pneumothorax. Upper Abdomen: No acute abnormality. Cholecystectomy clips are present. Musculoskeletal: There severe degenerative changes of the left shoulder. Right shoulder arthroplasty is present. Review of the MIP images confirms the above findings. IMPRESSION: 1. No evidence for pulmonary embolism. 2. Moderate-to-large sized pericardial effusion. 3. Trace left pleural effusion. Electronically Signed   By: Greig Pique M.D.   On: 12/08/2023 19:03   DG Chest 2 View Result Date: 12/08/2023 CLINICAL DATA:  Chest pain. EXAM: CHEST - 2 VIEW COMPARISON:  Chest radiograph dated 01/29/2021. FINDINGS: No focal consolidation, pleural effusion or pneumothorax. Top-normal cardiac size. No acute osseous pathology. Over shoulder arthroplasty. IMPRESSION: No active cardiopulmonary disease. Electronically Signed   By: Vanetta Chou M.D.   On: 12/08/2023 17:23    My interpretation of Electrocardiogram: Sinus rhythm at 100 bpm.  Left axis deviation is noted.   Nonspecific ST segment changes.  Nonspecific T wave changes noted.  RBBB is noted.   Problem List  Principal Problem:   Pericardial effusion Active Problems:   Hypothyroidism   Hypertension associated with diabetes (HCC)   Poorly controlled type 2 diabetes mellitus with circulatory disorder (HCC)   Gout, tophaceous   Psoriatic arthropathy (HCC)   Hyponatremia   Assessment: This is a 73 year old female with past medical history as stated earlier who presents with shortness of breath and chest pain ongoing for few days.  Found to have moderate to large pericardial effusion.  Etiology for her pericardial fusion is not entirely clear.  She will need hospitalization for further evaluation.  Plan: #1. Pericardial effusion: CT angiogram suggested moderate to large pericardial effusion.  Currently clinically she does not have any signs or symptoms of pericardial tamponade.  Vital signs are noted to be stable.  Continue to monitor on telemetry.  Etiology for her pericardial effusion is not entirely clear.  Will check TSH ESR CRP.  Echocardiogram has been ordered.  Cardiology will evaluate the patient tomorrow.  Will keep her n.p.o. for now.  Of note she had an echocardiogram in June which showed normal LVEF.  At that time there was no evidence for pericardial effusion.  #2. Diabetes mellitus type 2: Uses insulin  at home.  SSI.  Check HbA1c.  #3. Essential hypertension: Monitor blood pressures closely  #4.  Hyponatremia: Reports poor oral intake in the last few days.  She is also on diuretics which will be held.  Normal saline infusion will be given.  Labs will be checked in the morning.  #5.  Left shoulder pain: Supposed to undergo shoulder surgery next week.  #6.  Psoriatec arthropathy: Stable.  #7.  History of gout: Stable.  Continue allopurinol   #8.  Hypothyroidism: Continue levothyroxine .  Check TSH as mentioned above.  #9 history of bipolar disorder: Continue with the psychotropic  agents.  She is noted to be on doxepin , Zoloft  and Lamictal .  #10 normocytic anemia: Check anemia panel.  No evidence of overt bleeding.  DVT Prophylaxis: SCDs for now Code Status: Full code Family Communication: Discussed with the patient Disposition: Hopefully home in improved Consults called: Cardiology Admission Status: Status is: Inpatient Remains inpatient appropriate because: Pericardial effusion    Severity of Illness: The appropriate patient status for this patient is INPATIENT. Inpatient status is judged to be reasonable and necessary in order to provide the required intensity of service to ensure the patient's safety. The patient's presenting symptoms, physical exam findings, and initial radiographic and laboratory data in the context of their chronic comorbidities is felt to place them at high risk for further clinical deterioration. Furthermore, it is not anticipated that the patient will be medically stable for discharge from the hospital within 2 midnights of admission.   * I certify that at the point of admission it is my clinical judgment that the patient will require inpatient hospital care spanning beyond 2 midnights from the point of admission due to high intensity of service, high risk for further deterioration and high frequency of surveillance required.*   Further management decisions will depend on results of further testing and patient's response to treatment.   Brittany Acosta  [facility] Pager on [email or web address]  12/08/2023, 11:46 PM

## 2023-12-09 ENCOUNTER — Telehealth (HOSPITAL_COMMUNITY): Payer: Self-pay | Admitting: Pharmacy Technician

## 2023-12-09 ENCOUNTER — Inpatient Hospital Stay (HOSPITAL_COMMUNITY)

## 2023-12-09 ENCOUNTER — Other Ambulatory Visit (HOSPITAL_COMMUNITY): Payer: Self-pay

## 2023-12-09 ENCOUNTER — Encounter (HOSPITAL_COMMUNITY)
Admission: EM | Disposition: A | Discharge status: Skilled Nursing Facility | Payer: Self-pay | Source: Ambulatory Visit | Attending: Internal Medicine

## 2023-12-09 ENCOUNTER — Encounter (HOSPITAL_COMMUNITY): Payer: Self-pay

## 2023-12-09 DIAGNOSIS — E039 Hypothyroidism, unspecified: Secondary | ICD-10-CM | POA: Diagnosis not present

## 2023-12-09 DIAGNOSIS — I3139 Other pericardial effusion (noninflammatory): Secondary | ICD-10-CM

## 2023-12-09 DIAGNOSIS — E871 Hypo-osmolality and hyponatremia: Secondary | ICD-10-CM | POA: Diagnosis not present

## 2023-12-09 DIAGNOSIS — E1159 Type 2 diabetes mellitus with other circulatory complications: Secondary | ICD-10-CM | POA: Diagnosis not present

## 2023-12-09 DIAGNOSIS — E1165 Type 2 diabetes mellitus with hyperglycemia: Secondary | ICD-10-CM

## 2023-12-09 HISTORY — PX: PERICARDIOCENTESIS: CATH118255

## 2023-12-09 LAB — FERRITIN: Ferritin: 115 ng/mL (ref 11–307)

## 2023-12-09 LAB — COMPREHENSIVE METABOLIC PANEL WITH GFR
ALT: 11 U/L (ref 0–44)
AST: 14 U/L — ABNORMAL LOW (ref 15–41)
Albumin: 2.8 g/dL — ABNORMAL LOW (ref 3.5–5.0)
Alkaline Phosphatase: 78 U/L (ref 38–126)
Anion gap: 10 (ref 5–15)
BUN: 9 mg/dL (ref 8–23)
CO2: 24 mmol/L (ref 22–32)
Calcium: 8.3 mg/dL — ABNORMAL LOW (ref 8.9–10.3)
Chloride: 95 mmol/L — ABNORMAL LOW (ref 98–111)
Creatinine, Ser: 0.71 mg/dL (ref 0.44–1.00)
GFR, Estimated: >60 mL/min (ref >60)
Glucose, Bld: 125 mg/dL — ABNORMAL HIGH (ref 70–99)
Potassium: 3.3 mmol/L — ABNORMAL LOW (ref 3.5–5.1)
Sodium: 129 mmol/L — ABNORMAL LOW (ref 135–145)
Total Bilirubin: 0.5 mg/dL (ref 0.0–1.2)
Total Protein: 6.1 g/dL — ABNORMAL LOW (ref 6.5–8.1)

## 2023-12-09 LAB — ECHOCARDIOGRAM LIMITED
Calc EF: 68.3 %
Height: 61.5 in
Height: 61.5 in
Single Plane A2C EF: 71.4 %
Single Plane A4C EF: 65.5 %
Weight: 3344 [oz_av]
Weight: 3344 [oz_av]

## 2023-12-09 LAB — TSH: TSH: 6.803 u[IU]/mL — ABNORMAL HIGH (ref 0.350–4.500)

## 2023-12-09 LAB — CBC
HCT: 28.9 % — ABNORMAL LOW (ref 36.0–46.0)
Hemoglobin: 9.8 g/dL — ABNORMAL LOW (ref 12.0–15.0)
MCH: 27.8 pg (ref 26.0–34.0)
MCHC: 33.9 g/dL (ref 30.0–36.0)
MCV: 81.9 fL (ref 80.0–100.0)
Platelets: 473 K/uL — ABNORMAL HIGH (ref 150–400)
RBC: 3.53 MIL/uL — ABNORMAL LOW (ref 3.87–5.11)
RDW: 14.9 % (ref 11.5–15.5)
WBC: 14.7 K/uL — ABNORMAL HIGH (ref 4.0–10.5)
nRBC: 0 % (ref 0.0–0.2)

## 2023-12-09 LAB — VITAMIN B12: Vitamin B-12: 185 pg/mL (ref 180–914)

## 2023-12-09 LAB — IRON AND TIBC
Iron: <10 ug/dL — ABNORMAL LOW (ref 28–170)
TIBC: 231 ug/dL — ABNORMAL LOW (ref 250–450)

## 2023-12-09 LAB — HEMOGLOBIN A1C
Hgb A1c MFr Bld: 5.6 % (ref 4.8–5.6)
Mean Plasma Glucose: 114.02 mg/dL

## 2023-12-09 LAB — GLUCOSE, CAPILLARY
Glucose-Capillary: 115 mg/dL — ABNORMAL HIGH (ref 70–99)
Glucose-Capillary: 99 mg/dL (ref 70–99)
Glucose-Capillary: 71 mg/dL (ref 70–99)
Glucose-Capillary: 236 mg/dL — ABNORMAL HIGH (ref 70–99)

## 2023-12-09 LAB — BODY FLUID CELL COUNT WITH DIFFERENTIAL
Eos, Fluid: 0 %
Lymphs, Fluid: 57 %
Monocyte-Macrophage-Serous Fluid: 1 % — ABNORMAL LOW (ref 50–90)
Neutrophil Count, Fluid: 42 % — ABNORMAL HIGH (ref 0–25)
Total Nucleated Cell Count, Fluid: 5670 uL — ABNORMAL HIGH (ref 0–1000)

## 2023-12-09 LAB — RETICULOCYTES
Immature Retic Fract: 10.2 % (ref 2.3–15.9)
RBC.: 3.42 MIL/uL — ABNORMAL LOW (ref 3.87–5.11)
Retic Count, Absolute: 40.7 K/uL (ref 19.0–186.0)
Retic Ct Pct: 1.2 % (ref 0.4–3.1)

## 2023-12-09 LAB — C-REACTIVE PROTEIN: CRP: 23.1 mg/dL — ABNORMAL HIGH (ref <1.0)

## 2023-12-09 LAB — FOLATE: Folate: >20 ng/mL (ref >5.9)

## 2023-12-09 LAB — SEDIMENTATION RATE: Sed Rate: 55 mm/h — ABNORMAL HIGH (ref 0–22)

## 2023-12-09 SURGERY — PERICARDIOCENTESIS
Anesthesia: LOCAL

## 2023-12-09 MED ORDER — LABETALOL HCL 5 MG/ML IV SOLN
10.0000 mg | INTRAVENOUS | Status: AC | PRN
Start: 2023-12-09 — End: 2023-12-09
  Administered 2023-12-09: 10 mg via INTRAVENOUS
  Filled 2023-12-09: qty 4

## 2023-12-09 MED ORDER — IBUPROFEN 600 MG PO TABS
600.0000 mg | ORAL_TABLET | Freq: Three times a day (TID) | ORAL | Status: DC
  Administered 2023-12-09 – 2023-12-24 (×46): 600 mg via ORAL
  Filled 2023-12-09 (×8): qty 1
  Filled 2023-12-09: qty 3
  Filled 2023-12-09 (×2): qty 1
  Filled 2023-12-09: qty 3
  Filled 2023-12-09 (×3): qty 1
  Filled 2023-12-09: qty 3
  Filled 2023-12-09 (×31): qty 1

## 2023-12-09 MED ORDER — FENTANYL CITRATE (PF) 100 MCG/2ML IJ SOLN
INTRAMUSCULAR | Status: DC | PRN
  Administered 2023-12-09: 25 ug via INTRAVENOUS

## 2023-12-09 MED ORDER — DEXTROMETHORPHAN POLISTIREX ER 30 MG/5ML PO SUER
30.0000 mg | Freq: Two times a day (BID) | ORAL | Status: DC | PRN
  Administered 2023-12-09 – 2023-12-11 (×6): 30 mg via ORAL
  Filled 2023-12-09 (×9): qty 5

## 2023-12-09 MED ORDER — POTASSIUM CHLORIDE CRYS ER 20 MEQ PO TBCR
60.0000 meq | EXTENDED_RELEASE_TABLET | Freq: Once | ORAL | Status: AC
  Administered 2023-12-09: 60 meq via ORAL
  Filled 2023-12-09: qty 3

## 2023-12-09 MED ORDER — LIDOCAINE HCL (PF) 1 % IJ SOLN
INTRAMUSCULAR | Status: DC | PRN
  Administered 2023-12-09: 15 mL

## 2023-12-09 MED ORDER — BENZONATATE 100 MG PO CAPS
100.0000 mg | ORAL_CAPSULE | Freq: Once | ORAL | Status: AC | PRN
  Administered 2023-12-09: 100 mg via ORAL
  Filled 2023-12-09: qty 1

## 2023-12-09 MED ORDER — CHLORHEXIDINE GLUCONATE CLOTH 2 % EX PADS
6.0000 | MEDICATED_PAD | Freq: Every day | CUTANEOUS | Status: AC
Start: 2023-12-09 — End: ?
  Administered 2023-12-09 – 2023-12-14 (×6): 6 via TOPICAL

## 2023-12-09 MED ORDER — HEPARIN (PORCINE) IN NACL 1000-0.9 UT/500ML-% IV SOLN
INTRAVENOUS | Status: DC | PRN
  Administered 2023-12-09: 500 mL

## 2023-12-09 MED ORDER — MIDAZOLAM HCL 2 MG/2ML IJ SOLN
INTRAMUSCULAR | Status: DC | PRN
  Administered 2023-12-09: 2 mg via INTRAVENOUS

## 2023-12-09 MED ORDER — SODIUM CHLORIDE 0.9% FLUSH: 3.0000 mL | INTRAVENOUS | Status: DC | PRN

## 2023-12-09 MED ORDER — MORPHINE SULFATE (PF) 2 MG/ML IV SOLN
2.0000 mg | INTRAVENOUS | Status: DC | PRN
  Administered 2023-12-10 – 2024-01-01 (×17): 2 mg via INTRAVENOUS
  Filled 2023-12-09 (×17): qty 1

## 2023-12-09 MED ORDER — HYDRALAZINE HCL 20 MG/ML IJ SOLN
10.0000 mg | INTRAMUSCULAR | Status: AC | PRN
Start: 2023-12-09 — End: 2023-12-09

## 2023-12-09 MED ORDER — FENTANYL CITRATE (PF) 100 MCG/2ML IJ SOLN
INTRAMUSCULAR | Status: AC
  Filled 2023-12-09: qty 2

## 2023-12-09 MED ORDER — COLCHICINE 0.6 MG PO TABS
0.6000 mg | ORAL_TABLET | Freq: Two times a day (BID) | ORAL | Status: DC
  Administered 2023-12-09 – 2023-12-19 (×21): 0.6 mg via ORAL
  Filled 2023-12-09 (×22): qty 1

## 2023-12-09 MED ORDER — SODIUM CHLORIDE 0.9% FLUSH
3.0000 mL | Freq: Two times a day (BID) | INTRAVENOUS | Status: DC
  Administered 2023-12-09 – 2024-01-02 (×45): 3 mL via INTRAVENOUS

## 2023-12-09 MED ORDER — LIDOCAINE HCL (PF) 1 % IJ SOLN
INTRAMUSCULAR | Status: AC
  Filled 2023-12-09: qty 30

## 2023-12-09 MED ORDER — MIDAZOLAM HCL 2 MG/2ML IJ SOLN
INTRAMUSCULAR | Status: AC
  Filled 2023-12-09: qty 2

## 2023-12-09 MED ORDER — SODIUM CHLORIDE 0.9 % IV SOLN
250.0000 mL | INTRAVENOUS | Status: AC | PRN
Start: 2023-12-09 — End: 2023-12-10

## 2023-12-09 SURGICAL SUPPLY — 5 items
EVACUATOR 1/8 PVC DRAIN (DRAIN) IMPLANT
KIT MICROPUNCTURE NIT STIFF (SHEATH) IMPLANT
PACK CARDIAC CATHETERIZATION (CUSTOM PROCEDURE TRAY) IMPLANT
TRAY PERICARDIOCENTESIS 6FX60 (TRAY / TRAY PROCEDURE) IMPLANT
WIRE MICRO SET 5FR 12 (WIRE) IMPLANT

## 2023-12-09 NOTE — Telephone Encounter (Signed)
 Patient Product/process development scientist completed.    The patient is insured through [employer]/ [employer]. Patient has Medicare and is not eligible for a copay card, but may be able to apply for patient assistance or Medicare RX Payment Plan (Patient Must reach out to their plan, if eligible for payment plan), if available.    Ran test claim for colchicine  0.6 mg tablet and the current 30 day co-pay is $37.00.   This test claim was processed through [facility]- copay amounts may vary at other pharmacies due to pharmacy/plan contracts, or as the patient moves through the different stages of their insurance plan.     Reyes Sharps, CPHT Pharmacy Technician III Certified Patient Advocate [facility] Patient Advocate Team Direct Number: [phone]  Fax: [phone]

## 2023-12-09 NOTE — Progress Notes (Signed)
 [facility]                                                                               Brittany Acosta, is a 73 y.o. female, DOB - January 31, 1951, FMW:[id number] Admit date - 12/08/2023    Outpatient Primary MD for the patient is Brittany Jenkins Jansky, MD  LOS - 1  days    Brief summary   Brittany Acosta is a 73 y.o. female with a past medical history of diabetes mellitus type 2 on insulin , essential hypertension, Psoriatec arthropathy, history of gout, history of breast cancer, hyperlipidemia who was in her usual state of health till about 2 to 3 days ago when she started developing shortness of breath along with chest pain.  CT scan chest  which raise concern for moderate to large pericardial effusion. Case was discussed with cardiology who recommended hospitalization for further evaluation.  She was taken for OR for pericardiocentesis.  Assessment & Plan    Assessment and Plan:   Pericardial effusion:  Unclear etiology, differential include uremic etiology, vs viral infection.  Echocardiogram showed moderate effusion, with JVD and symptomatic with sob and intermittent chest pain.  Cardiology on board and she is scheduled for pericardiocentesis later today.     Hypokalemia Replaced, repeat in am.    Hyponatremia; Pt reports poor oral intake, lack of appetite due to sob and chest pain and weight loss.  Hydrate and recheck levels in am.  Monitor.     Hypothyroidism:  Resume synthroids.    Type 2 DM CBG (last 3)  Recent Labs    12/09/23 0811 12/09/23 1157 12/09/23 1608  GLUCAP 115* 99 71   Resume SSI.    Hyperlipidemia Resume pravastatin  20 mg daily.    Bipolar disorder Resume zoloft     Estimated body mass index is 38.85 kg/m as calculated from the following:   Height as of this encounter: 5' 1.5 (1.562 m).   Weight as of this encounter: 94.8 kg.  Code Status: full code.  DVT Prophylaxis:  SCD's Start: 12/09/23 1559 SCDs Start: 12/08/23  2335   Level of Care: Level of care: ICU Family Communication: none at bedside.   Disposition Plan:     Remains inpatient appropriate:  pending clnical improvement.   Procedures:  Pericardiocentesis.   Consultants:   Cardiology.  Antimicrobials:   Anti-infectives (From admission, onward)    None        Medications  Scheduled Meds:  allopurinol   300 mg Oral Daily   Chlorhexidine  Gluconate Cloth  6 each Topical Daily   colchicine   0.6 mg Oral BID   doxepin   10 mg Oral Daily   ibuprofen   600 mg Oral TID   insulin  aspart  0-15 Units Subcutaneous TID WC   lamoTRIgine   200 mg Oral Q1200   levothyroxine   50 mcg Oral Daily   oxybutynin   10 mg Oral QHS   pantoprazole   40 mg Oral Daily   pravastatin   20 mg Oral QHS   sertraline   100 mg Oral QHS   sodium chloride  flush  3 mL Intravenous Q12H   Continuous Infusions:  sodium chloride  75 mL/hr at 12/09/23 1604   sodium  chloride     PRN Meds:.sodium chloride , acetaminophen  **OR** acetaminophen , dextromethorphan , hydrALAZINE , labetalol , morphine  injection, ondansetron  **OR** ondansetron  (ZOFRAN ) IV, sodium chloride  flush    Subjective:   Brittany Acosta was seen and examined today.  Some sob on ambulation. And chest pressure.  No nausea or vomiting.   Objective:   Vitals:   12/09/23 1528 12/09/23 1531 12/09/23 1534 12/09/23 1609  BP: (!) 140/70 (!) 133/91 (!) 137/99   Pulse: 93 91    Resp: 20 (!) 22    Temp:   (!) 97.3 F (36.3 C) (!) 97.3 F (36.3 C)  TempSrc:   Axillary Axillary  SpO2: 100% 98%    Weight:      Height:       No intake or output data in the 24 hours ending 12/09/23 1619 Filed Weights   12/08/23 1543 12/08/23 2303  Weight: 97.1 kg 94.8 kg     Exam General exam: Appears calm and comfortable  Respiratory system: Clear to auscultation. Respiratory effort normal. Cardiovascular system: S1 & S2 heard, RRR. No JVD, Gastrointestinal system: Abdomen is nondistended, soft and nontender.  Central  nervous system: Alert and oriented.  Extremities: Symmetric 5 x 5 power. Skin: No rashes,  Psychiatry: Mood & affect appropriate.     Data Reviewed:  I have personally reviewed following labs and imaging studies   CBC Lab Results  Component Value Date   WBC 14.7 (H) 12/09/2023   RBC 3.53 (L) 12/09/2023   RBC 3.42 (L) 12/09/2023   HGB 9.8 (L) 12/09/2023   HCT 28.9 (L) 12/09/2023   MCV 81.9 12/09/2023   MCH 27.8 12/09/2023   PLT 473 (H) 12/09/2023   MCHC 33.9 12/09/2023   RDW 14.9 12/09/2023   LYMPHSABS 3.2 11/22/2023   MONOABS 0.7 11/22/2023   EOSABS 0.3 11/22/2023   BASOSABS 0.1 11/22/2023     Last metabolic panel Lab Results  Component Value Date   NA 129 (L) 12/09/2023   K 3.3 (L) 12/09/2023   CL 95 (L) 12/09/2023   CO2 24 12/09/2023   BUN 9 12/09/2023   CREATININE 0.71 12/09/2023   GLUCOSE 125 (H) 12/09/2023   GFRNONAA >60 12/09/2023   GFRAA >60 09/14/2019   CALCIUM  8.3 (L) 12/09/2023   PROT 6.1 (L) 12/09/2023   ALBUMIN  2.8 (L) 12/09/2023   BILITOT 0.5 12/09/2023   ALKPHOS 78 12/09/2023   AST 14 (L) 12/09/2023   ALT 11 12/09/2023   ANIONGAP 10 12/09/2023    CBG (last 3)  Recent Labs    12/09/23 0811 12/09/23 1157 12/09/23 1608  GLUCAP 115* 99 71      Coagulation Profile: No results for input(s): INR, PROTIME in the last 168 hours.   Radiology Studies: CARDIAC CATHETERIZATION Result Date: 12/09/2023 Successful pericardiocentesis under fluoroscopic and echo guidance with removal of 700 cc bloody pericardial fluid Recommend: drain secured in place. Monitor output, limited echo in am. Fluid sent to lab for analysis.   ECHOCARDIOGRAM LIMITED Result Date: 12/09/2023    ECHOCARDIOGRAM LIMITED REPORT   Patient Name:   Brittany Acosta Date of Exam: 12/09/2023 Medical Rec #:  [id number]     Height:       61.5 in Accession #:    [id number]    Weight:       209.0 lb Date of Birth:  06-04-1950      BSA:          1.936 m Patient Age:    75 years  BP:            151/76 mmHg Patient Gender: F             HR:           88 bpm. Exam Location:  Inpatient Procedure: 2D Echo, Limited Echo, Cardiac Doppler and Color Doppler (Both            Spectral and Color Flow Doppler were utilized during procedure). Indications:    Pericardial effusion  History:        Patient has prior history of Echocardiogram examinations, most                 recent 09/28/2023. Risk Factors:Hypertension, Diabetes,                 Dyslipidemia and Sleep Apnea.  Sonographer:    Therisa Crouch Referring Phys: [id number] JOETTE PEBBLES IMPRESSIONS  1. Limited Echocardiogram, see echo dataed 09/29/2023 for additional details.  2. Left ventricular ejection fraction, by estimation, is 65 to 70%. The left ventricle has normal function.  3. Moderate size pericardial effusion, circumferential, right atrial collapse in systole, respiratory variation across the mitral valve approximately 25%, but IVC compressible. Findings to suggest possible early tamponade physiology. Patient is hypertensive and HR about 90-100bpm. Clinical correlation required - if considering conservative measure recommend repeating echo in 24hr or sooner if change in clinical status. Will update rounding cardiology team.  4. The inferior vena cava is normal in size with greater than 50% respiratory variability, suggesting right atrial pressure of 3 mmHg. Comparison(s): A prior study was performed on 09/29/2023. Pericardial effusion is new. FINDINGS  Left Ventricle: Left ventricular ejection fraction, by estimation, is 65 to 70%. The left ventricle has normal function. Pericardium: Moderate size pericardial effusion, circumferential, right atrial collapse in systole, respiratory variation across the mitral valve approximately 25%, but IVC compressible. Findings to suggest possible early tamponade physiology. Patient is  hypertensive and HR about 90-100bpm. Clinical correlation required - if considering conservative measure recommend repeating echo in  24hr or sooner if change in clinical status. Will update rounding cardiology team. Venous: The inferior vena cava is normal in size with greater than 50% respiratory variability, suggesting right atrial pressure of 3 mmHg.  LV Volumes (MOD) LV vol d, MOD A2C: 48.6 ml LV vol d, MOD A4C: 55.3 ml LV vol s, MOD A2C: 13.9 ml LV vol s, MOD A4C: 19.1 ml LV SV MOD A2C:     34.7 ml LV SV MOD A4C:     55.3 ml LV SV MOD BP:      35.9 ml IVC IVC diam: 1.80 cm Sunit Tolia Electronically signed by Madonna Large Signature Date/Time: 12/09/2023/10:27:03 AM    Final    CT Angio Chest PE W and/or Wo Contrast Result Date: 12/08/2023 CLINICAL DATA:  High probability for PE.  Chest pain. EXAM: CT ANGIOGRAPHY CHEST WITH CONTRAST TECHNIQUE: Multidetector CT imaging of the chest was performed using the standard protocol during bolus administration of intravenous contrast. Multiplanar CT image reconstructions and MIPs were obtained to evaluate the vascular anatomy. RADIATION DOSE REDUCTION: This exam was performed according to the departmental dose-optimization program which includes automated exposure control, adjustment of the mA and/or kV according to patient size and/or use of iterative reconstruction technique. CONTRAST:  75mL OMNIPAQUE  IOHEXOL  350 MG/ML SOLN COMPARISON:  None Available. FINDINGS: Cardiovascular: Satisfactory opacification of the pulmonary arteries to the segmental level. No evidence of pulmonary embolism. Normal heart size. There is a moderate-to-large  sized pericardial effusion. Mediastinum/Nodes: No enlarged mediastinal, hilar, or axillary lymph nodes. Thyroid  gland, trachea, and esophagus demonstrate no significant findings. Lungs/Pleura: There is a trace left pleural effusion. There is minimal atelectasis in the left lower lobe. The lungs are otherwise clear. There is no pneumothorax. Upper Abdomen: No acute abnormality. Cholecystectomy clips are present. Musculoskeletal: There severe degenerative changes of the  left shoulder. Right shoulder arthroplasty is present. Review of the MIP images confirms the above findings. IMPRESSION: 1. No evidence for pulmonary embolism. 2. Moderate-to-large sized pericardial effusion. 3. Trace left pleural effusion. Electronically Signed   By: Greig Pique M.D.   On: 12/08/2023 19:03   DG Chest 2 View Result Date: 12/08/2023 CLINICAL DATA:  Chest pain. EXAM: CHEST - 2 VIEW COMPARISON:  Chest radiograph dated 01/29/2021. FINDINGS: No focal consolidation, pleural effusion or pneumothorax. Top-normal cardiac size. No acute osseous pathology. Over shoulder arthroplasty. IMPRESSION: No active cardiopulmonary disease. Electronically Signed   By: Vanetta Chou M.D.   On: 12/08/2023 17:23       Elgie Butter M.D. [facility] 12/09/2023, 4:19 PM  Available via Epic secure chat 7am-7pm After 7 pm, please refer to night coverage provider listed on amion.

## 2023-12-09 NOTE — Consult Note (Signed)
 Cardiology Consultation   Patient ID: Brittany Acosta MRN: [id number]; DOB: [birth year]  Admit date: 12/08/2023 Date of Consult: 12/09/2023  PCP:  [name], MD    [facility] Providers Cardiologist:  Shelda Bruckner, MD     Patient Profile: Brittany Acosta is a 73 y.o. female with a hx of htn, OSA, DM, HLD, obesity, and fibromyalgia who is being seen 12/09/2023 for the evaluation of pericardial effusion at the request of Dr. Verdene.  History of Present Illness: Ms. Bickhart presented to the ED with cough and chest pain. She is scheduled to have left shoulder surgery on 9/11 and initially presented to her pre-operative clearance appointment. However, she was directed to the ED for a 2 day history of worsening cough along with associated chest pain and shortness of breath. She states her chest pain is mostly associated with the cough and does not have chest pain with lying flat or deep breaths. Her shortness of breath is also associated with a coughing spell. No orthopnea or lower extremity swelling.  She was sent here to [facility] for an incidentally discovered moderate to large pericardial effusion found on CTA of her chest. She has not had any lightheadedness or dizziness. Of note, she does report an unintentional roughly 30 pound weight loss over the past several months. She attributes this to lack of appetite and decreased PO intake. She denies fever, chills, abdominal pain, or any other symptoms over this time.  BP here has been stable. HR has ranged from 90s-120s but mainly has been in the 90s.    Past Medical History:  Diagnosis Date   Allergic rhinitis, cause unspecified    Arthritis    Bipolar disorder, unspecified (HCC)    depression   Cancer (HCC)    skin cancer on left leg, left breast cancer   Cataract    Chronic diastolic heart failure (HCC)    Depression    GERD (gastroesophageal reflux disease)    Headache    History of gout    haven't had it in several years  (02/08/2012)   History of kidney stones    History of wrist fracture    rt wrist   Hypertension    Iron deficiency anemia    Morbid obesity (HCC)    Neuropathy due to secondary diabetes (HCC)    Nontoxic multinodular goiter    Osteoporosis    Personal history of radiation therapy    Pneumonia    PONV (postoperative nausea and vomiting)    Psoriatic arthritis (HCC)    PTSD (post-traumatic stress disorder)    Pure hypercholesterolemia    RBBB    Scleroderma (HCC)    Sleep apnea 10/2012   mild osa-did not need cpap -dr clance   Type II diabetes mellitus (HCC)    Unspecified hypothyroidism     Past Surgical History:  Procedure Laterality Date   ABDOMINAL HYSTERECTOMY  1976   BREAST EXCISIONAL BIOPSY Left    BREAST LUMPECTOMY Left 04/25/2018   BREAST LUMPECTOMY WITH RADIOACTIVE SEED AND SENTINEL LYMPH NODE BIOPSY Left 04/25/2018   Procedure: LEFT BREAST LUMPECTOMY WITH RADIOACTIVE SEED AND SENTINEL LYMPH NODE BIOPSY;  Surgeon: Mikell Katz, MD;  Location: [facility] OR;  Service: General;  Laterality: Left;   BREAST SURGERY     CARDIAC CATHETERIZATION  2001   sees Dr Peter [place]   CATARACT EXTRACTION  2014   CATARACT EXTRACTION, BILATERAL  02/2011   epps   CHOLECYSTECTOMY  1985   ESOPHAGOGASTRODUODENOSCOPY (EGD) WITH PROPOFOL   N/A 10/14/2022   Procedure: ESOPHAGOGASTRODUODENOSCOPY (EGD) WITH PROPOFOL ;  Surgeon: Wilhelmenia Aloha Raddle., MD;  Location: [facility];  Service: Gastroenterology;  Laterality: N/A;   EUS N/A 10/14/2022   Procedure: UPPER ENDOSCOPIC ULTRASOUND (EUS) RADIAL;  Surgeon: Wilhelmenia Aloha Raddle., MD;  Location: [facility];  Service: Gastroenterology;  Laterality: N/A;   EXCISIONAL HEMORRHOIDECTOMY     dr cut out in his office (02/08/2012)   INCISIONAL BREAST BIOPSY  2000   right   JOINT REPLACEMENT     rt knee   KNEE ARTHROSCOPY  08/2009   right   KNEE ARTHROSCOPY WITH MEDIAL MENISECTOMY     left   Left Cystoscopy   1990   LEFT OOPHORECTOMY  1980    Lithotripsy (L) Kidney  1997   LUMBAR LAMINECTOMY/DECOMPRESSION MICRODISCECTOMY Left 07/01/2016   Procedure: Laminectomy for synovial cyst - left - Lumbar four-five;  Surgeon: Victory Gunnels, MD;  Location: [facility] OR;  Service: Neurosurgery;  Laterality: Left;   PARTIAL MASTECTOMY WITH NEEDLE LOCALIZATION Left 09/01/2012   Procedure: PARTIAL MASTECTOMY WITH NEEDLE LOCALIZATION;  Surgeon: Elon CHRISTELLA Pacini, MD;  Location: [facility] OR;  Service: General;  Laterality: Left;   Percitania stone removed (L) Kidney  1992   REVERSE SHOULDER ARTHROPLASTY Right 02/12/2021   Procedure: REVERSE SHOULDER ARTHROPLASTY;  Surgeon: Dozier Soulier, MD;  Location: [facility];  Service: Orthopedics;  Laterality: Right;   Right nasal surgery  08/1988   Right sinus removed  08/1989   tooth partial   ROTATOR CUFF REPAIR  2013   right shoulder x 2   SHOULDER ARTHROSCOPY WITH ROTATOR CUFF REPAIR AND SUBACROMIAL DECOMPRESSION Left 08/20/2013   Procedure: SHOULDER ARTHROSCOPY  AND SUBACROMIAL DECOMPRESSION;  Surgeon: Soulier Elsie Dozier, MD;  Location: [facility];  Service: Orthopedics;  Laterality: Left;  Left shoulder arthroscopy, debridement, subacromial decompression, distal clavical resection   THYROIDECTOMY  04/22/2011   Procedure: THYROIDECTOMY;  Surgeon: Vaughan JONETTA Ricker, MD;  Location: [facility] OR;  Service: ENT;  Laterality: N/A;  TOTAL THYROIDECOTMY   TOTAL KNEE ARTHROPLASTY  06/18/2011   Procedure: TOTAL KNEE ARTHROPLASTY;  Surgeon: Dempsey JINNY Sensor, MD;  Location: [facility];  Service: Orthopedics;  Laterality: Left;  DEPUY   TOTAL KNEE ARTHROPLASTY  02/07/2012   Procedure: TOTAL KNEE ARTHROPLASTY;  Surgeon: Dempsey JINNY Sensor, MD;  Location: [facility] OR;  Service: Orthopedics;  Laterality: Right;   TUBAL LIGATION  1972     Home Medications:  Prior to Admission medications   Medication Sig Start Date End Date Taking? Authorizing Provider  allopurinol  (ZYLOPRIM ) 300 MG tablet Take 1 tablet (300 mg total) by mouth daily. 11/22/23    [name], MD  aspirin  EC 81 MG tablet Take 1 tablet (81 mg total) by mouth daily. Swallow whole. 01/25/20   Lonni Slain, MD  Biotin  10000 MCG TABS Take 10,000 mcg by mouth at bedtime.    [provider]  BLACK ELDERBERRY,BERRY-FLOWER, PO Take 1 capsule by mouth in the morning.    [provider]  Blood Glucose Monitoring Suppl (ONETOUCH VERIO) w/Device KIT Use as directed to monitor glucose daily and as needed 06/14/23   Webb, Padonda B, FNP  cetirizine (ZYRTEC) 10 MG tablet Take 20 mg by mouth in the morning.    [provider]  Cholecalciferol  (VITAMIN D3) 125 MCG (5000 UT) CAPS Take 5,000 Units by mouth at bedtime.    [provider]  CINNAMON PO Take 2,000 mg by mouth in the morning.    [provider]  cyclobenzaprine  (FLEXERIL ) 10 MG tablet Take 1 tablet (10 mg) by mouth at bedtime as needed for muscle spasms. 11/22/23   [name], MD  doxepin  (SINEQUAN ) 10 MG capsule Take 1 capsule (10 mg total) by mouth daily. 11/22/23   [name], MD  ferrous sulfate 325 (65 FE) MG tablet Take 325 mg by mouth in the morning.    [provider]  fluticasone  (FLONASE ) 50 MCG/ACT nasal spray Place 2 sprays into both nostrils daily. Patient taking differently: Place 2 sprays into both nostrils daily as needed for allergies. 11/28/23   [name], MD  folic acid  (FOLVITE ) 1 MG tablet Take 2 tablets (2 mg total) by mouth in the morning. 11/22/23   [name], MD  furosemide  (LASIX ) 40 MG tablet Take 1 tablet (40 mg total) by mouth daily. 02/08/23   [name], MD  glucose blood (TRUE METRIX BLOOD GLUCOSE TEST) test strip TEST BLOOD SUGAR EVERY DAY AND AS NEEDED 06/11/23   Webb, Padonda B, FNP  Homeopathic Products (EARACHE DROPS OT) Place 1-2 drops in ear(s) 3 (three) times daily as needed (ear pain.).    [provider]  insulin  degludec (TRESIBA  FLEXTOUCH) 100 UNIT/ML FlexTouch Pen Inject 26 Units into the  skin in the morning. Patient taking differently: Inject 23 Units into the skin in the morning. 11/22/23   [name], MD  Insulin  Pen Needle (DROPLET PEN NEEDLES) 31G X 5 MM MISC USE TO INJECT INSULIN  DAILY 03/19/20   Job Lukes, PA  lamoTRIgine  (LAMICTAL ) 200 MG tablet TAKE 1 TABLET (200 MG TOTAL) BY MOUTH DAILY AT 12 NOON. 02/08/23   [name], MD  Lancets [facility] DELICA PLUS [place]) MISC Use as directed to monitor glucose daily and as needed. 06/14/23   Webb, Padonda B, FNP  levothyroxine  (SYNTHROID ) 50 MCG tablet Take 1 tablet (50 mcg total) by mouth daily. 12/12/22   [name], MD  lisinopril  (ZESTRIL ) 5 MG tablet Take 1 tablet (5 mg total) by mouth daily. 02/08/23   [name], MD  magnesium  oxide (MAG-OX) 400 (240 Mg) MG tablet Take 400 mg by mouth in the morning.    [provider]  metFORMIN  (GLUCOPHAGE ) 500 MG tablet Take 2 tablets (1,000 mg total) by mouth every morning AND 1 tablet (500 mg total) every evening. 11/22/23   [name], MD  omeprazole  (PRILOSEC) 20 MG capsule TAKE 1 CAPSULE (20 MG TOTAL) BY MOUTH DAILY. TAKE 30 MINUTES BEFORE A MEAL Patient taking differently: Take 20 mg by mouth daily at 12 noon. 02/08/23   [name], MD  ondansetron  (ZOFRAN -ODT) 4 MG disintegrating tablet Take 1 tablet (4 mg total) by mouth every 4 (four) hours as needed for nausea/vomiting 11/18/22   [name], MD  oxybutynin  (DITROPAN -XL) 10 MG 24 hr tablet Take 1 tablet (10 mg total) by mouth at bedtime. 11/22/23   [name], MD  Polyethyl Glycol-Propyl Glycol (SYSTANE ULTRA) 0.4-0.3 % SOLN Place 1-2 drops into both eyes 3 (three) times daily as needed (dry/irritated eyes).    [provider]  potassium chloride  SA (KLOR-CON  M) 20 MEQ tablet Take 1 tablet (20 mEq total) by mouth daily. Patient taking differently: Take 20 mEq by mouth every evening. 11/22/23   [name], MD  pravastatin  (PRAVACHOL ) 20 MG tablet Take 1 tablet  (20 mg total) by mouth at bedtime. 11/22/23   [name], MD  sertraline  (ZOLOFT ) 100 MG tablet Take  1 tablet (100 mg total) by mouth at bedtime. 10/30/23   [name], MD  sulfaSALAzine  (AZULFIDINE ) 500 MG tablet Take 2 tablets (1,000 mg total) by mouth 2 (two) times daily. 09/20/23     tamsulosin  (FLOMAX ) 0.4 MG CAPS capsule Take 1 capsule (0.4 mg total) by mouth at bedtime. 11/22/23   [name], MD    Scheduled Meds:  allopurinol   300 mg Oral Daily   doxepin   10 mg Oral Daily   insulin  aspart  0-15 Units Subcutaneous TID WC   lamoTRIgine   200 mg Oral Q1200   levothyroxine   50 mcg Oral Daily   oxybutynin   10 mg Oral QHS   pantoprazole   40 mg Oral Daily   pravastatin   20 mg Oral QHS   sertraline   100 mg Oral QHS   Continuous Infusions:  sodium chloride  75 mL/hr at 12/08/23 2354   PRN Meds: acetaminophen  **OR** acetaminophen , ondansetron  **OR** ondansetron  (ZOFRAN ) IV  Allergies:   No Known Allergies  Social History:   Social History   Socioeconomic History   Marital status: Married    Spouse name: Not on file   Number of children: 2   Years of education: 14   Highest education level: Not on file  Occupational History   Occupation: disability    Employer: RETIRED  Tobacco Use   Smoking status: Former    Current packs/day: 0.00    Average packs/day: 1 pack/day for 6.0 years (6.0 ttl pk-yrs)    Types: Cigarettes    Start date: 04/05/1977    Quit date: 04/06/1983    Years since quitting: 40.7   Smokeless tobacco: Never   Tobacco comments:    Married, lives with spouse. Disable- 2 grown kids-6 g-kids  Vaping Use   Vaping status: Never Used  Substance and Sexual Activity   Alcohol use: No    Comment: none   Drug use: No   Sexual activity: Not Currently  Other Topics Concern   Not on file  Social History Narrative   Married, lives with spouse - 2 adult children   Disabled -- started around 2000 for mental health reasons, Bipolar Disorder/PTSD   Husband  works   Social Drivers of Corporate investment banker Strain: Low Risk  (09/05/2019)   Received from [facility]   Overall Financial Resource Strain (CARDIA)    Difficulty of Paying Living Expenses: Not very hard  Food Insecurity: No Food Insecurity (12/08/2023)   Hunger Vital Sign    Worried About Running Out of Food in the Last Year: Never true    Ran Out of Food in the Last Year: Never true  Transportation Needs: No Transportation Needs (12/08/2023)   PRAPARE - Administrator, Civil Service (Medical): No    Lack of Transportation (Non-Medical): No  Physical Activity: Unknown (09/05/2019)   Received from [facility]   Exercise Vital Sign    On average, how many days per week do you engage in moderate to strenuous exercise (like a brisk walk)?: 0 days    Minutes of Exercise per Session: Not on file  Stress: No Stress Concern Present (09/05/2019)   Received from [facility] - Occupational Stress Questionnaire    Feeling of Stress : Only a little  Social Connections: Moderately Isolated (12/08/2023)   Social Connection and Isolation Panel    Frequency of Communication with Friends and Family: More than three times a week    Frequency  of Social Gatherings with Friends and Family: Once a week    Attends Religious Services: Never    Database administrator or Organizations: No    Attends Banker Meetings: Never    Marital Status: Married  Catering manager Violence: Not At Risk (12/08/2023)   Humiliation, Afraid, Rape, and Kick questionnaire    Fear of Current or Ex-Partner: No    Emotionally Abused: No    Physically Abused: No    Sexually Abused: No    Family History:   Family History  Problem Relation Age of Onset   Hypertension Mother    Lung cancer Mother        non-smoker   Diabetes Father    Hypertension Father    Hyperlipidemia Father    Hypertension Sister    Hypertension Sister    Hypertension Sister     Hypertension Brother    Hypertension Brother    Hypertension Brother    Stomach cancer Maternal Grandmother    Diabetes Daughter    Heart attack Other    Coronary artery disease Other    Colon cancer Neg Hx        unsure   Esophageal cancer Neg Hx    Liver cancer Neg Hx    Pancreatic cancer Neg Hx    Rectal cancer Neg Hx    Colon polyps Neg Hx      ROS:  Please see the history of present illness.  All other ROS reviewed and negative.     Physical Exam/Data: Vitals:   12/08/23 1942 12/08/23 2000 12/08/23 2130 12/08/23 2303  BP: 139/70 114/82 (!) 145/83 138/71  Pulse: 99 (!) 102 (!) 101 99  Resp: 18 (!) 21 (!) 25 18  Temp: 98.8 F (37.1 C)   99.1 F (37.3 C)  TempSrc: Oral   Oral  SpO2: 100% 99% 100% 99%  Weight:    94.8 kg  Height:       No intake or output data in the 24 hours ending 12/09/23 0019    12/08/2023   11:03 PM 12/08/2023    3:43 PM 11/22/2023    4:11 PM  Last 3 Weights  Weight (lbs) 209 lb 214 lb 213 lb 2 oz  Weight (kg) 94.802 kg 97.07 kg 96.673 kg     Body mass index is 38.85 kg/m.  General:  Well nourished, well developed, in no acute distress, lying flat comfortably HEENT: normal Neck: no JVD Vascular: No carotid bruits; Distal pulses 2+ bilaterally Cardiac:  normal S1, S2; RRR; no murmur Lungs:  clear to auscultation bilaterally, no wheezing, rhonchi or rales  Abd: soft, nontender, no hepatomegaly  Ext: no edema Musculoskeletal:  No deformities, BUE and BLE strength normal and equal Skin: warm and dry  Neuro:  no focal abnormalities noted Psych:  Normal affect   EKG:  The EKG was personally reviewed and demonstrates:  SR RBBB LAFB no STE Telemetry:  Telemetry was personally reviewed and demonstrates:  SR with sinus arrhythmia  Relevant CV Studies: TTE 09/29/2023 IMPRESSIONS   1. Left ventricular ejection fraction, by estimation, is 55 to 60%. Left  ventricular ejection fraction by 3D volume is 56 %. The left ventricle has  normal  function. The left ventricle has no regional wall motion  abnormalities. Left ventricular diastolic  parameters were normal. The average left ventricular global longitudinal strain is -24.1 %. The global longitudinal strain is normal.   2. Right ventricular systolic function is normal. The right ventricular  size is normal.   3. The mitral valve is normal in structure. No evidence of mitral valve  regurgitation. No evidence of mitral stenosis.   4. The aortic valve is normal in structure. Aortic valve regurgitation is  not visualized. No aortic stenosis is present.   5. The inferior vena cava is normal in size with greater than 50%  respiratory variability, suggesting right atrial pressure of 3 mmHg.  Pericardium: There is no evidence of pericardial effusion.   NM PET Cardiac Perfusion Scan  08/16/2023   Findings are consistent with very mild, isolated anterolateral ischemia with decrease LCX stress flows 1.76 ml/min/g. The study is low to moderate risk only due to decrease in myocardial blood flow reserve, 1.71.   LV perfusion is abnormal. There is evidence of ischemia. There is no evidence of infarction. Defect 1: There is a small defect with mild reduction in uptake present in the mid anterolateral location(s) that is reversible. There is normal wall motion in the defect area. Consistent with ischemia.   Rest left ventricular function is normal. Rest EF: 61%. Stress left ventricular function is normal. Stress EF: 66%. End diastolic cavity size is normal. End systolic cavity size is normal.   Myocardial blood flow was computed to be 1.26ml/g/min at rest and 1.47ml/g/min at stress. Global myocardial blood flow reserve was 1.71 and was mildly abnormal.   Coronary calcium  was present on the attenuation correction CT images. Minimal coronary calcifications were present. Coronary calcifications were present in the left anterior descending artery distribution(s). Aortic atheroscerosis noted.  Small,  circumferential pericardial effusion noted.   Electronically Signed  By: Stanly Leavens M.D.  Laboratory Data:  Chemistry Recent Labs  Lab 12/08/23 1544  NA 129*  K 3.7  CL 90*  CO2 24  GLUCOSE 169*  BUN 11  CREATININE 0.71  CALCIUM  9.5  GFRNONAA >60  ANIONGAP 16*    Recent Labs  Lab 12/08/23 1700  PROT 7.1  ALBUMIN  3.8  AST 19  ALT 12  ALKPHOS 105  BILITOT 0.4   Hematology Recent Labs  Lab 12/08/23 1544  WBC 14.1*  RBC 3.81*  HGB 10.5*  HCT 31.9*  MCV 83.7  MCH 27.6  MCHC 32.9  RDW 15.2  PLT 466*    BNP Recent Labs  Lab 12/08/23 1700  PROBNP 725.0*     Radiology/Studies:  CT Angio Chest PE W and/or Wo Contrast Result Date: 12/08/2023 CLINICAL DATA:  High probability for PE.  Chest pain. EXAM: CT ANGIOGRAPHY CHEST WITH CONTRAST TECHNIQUE: Multidetector CT imaging of the chest was performed using the standard protocol during bolus administration of intravenous contrast. Multiplanar CT image reconstructions and MIPs were obtained to evaluate the vascular anatomy. RADIATION DOSE REDUCTION: This exam was performed according to the departmental dose-optimization program which includes automated exposure control, adjustment of the mA and/or kV according to patient size and/or use of iterative reconstruction technique. CONTRAST:  75mL OMNIPAQUE  IOHEXOL  350 MG/ML SOLN COMPARISON:  None Available. FINDINGS: Cardiovascular: Satisfactory opacification of the pulmonary arteries to the segmental level. No evidence of pulmonary embolism. Normal heart size. There is a moderate-to-large sized pericardial effusion. Mediastinum/Nodes: No enlarged mediastinal, hilar, or axillary lymph nodes. Thyroid  gland, trachea, and esophagus demonstrate no significant findings. Lungs/Pleura: There is a trace left pleural effusion. There is minimal atelectasis in the left lower lobe. The lungs are otherwise clear. There is no pneumothorax. Upper Abdomen: No acute abnormality.  Cholecystectomy clips are present. Musculoskeletal: There severe degenerative changes of the left shoulder. Right  shoulder arthroplasty is present. Review of the MIP images confirms the above findings. IMPRESSION: 1. No evidence for pulmonary embolism. 2. Moderate-to-large sized pericardial effusion. 3. Trace left pleural effusion. Electronically Signed   By: Greig Pique M.D.   On: 12/08/2023 19:03   DG Chest 2 View Result Date: 12/08/2023 CLINICAL DATA:  Chest pain. EXAM: CHEST - 2 VIEW COMPARISON:  Chest radiograph dated 01/29/2021. FINDINGS: No focal consolidation, pleural effusion or pneumothorax. Top-normal cardiac size. No acute osseous pathology. Over shoulder arthroplasty. IMPRESSION: No active cardiopulmonary disease. Electronically Signed   By: Vanetta Chou M.D.   On: 12/08/2023 17:23   Assessment and Plan: Pericardial effusion Cough  Normocytic anemia Hyponatremia Weight loss  Leukocytosis The patient had a moderate to large pericardial effusion found on CTA of her chest. She also has a trace left sided pleural effusion on the CT. Troponin and ProBNP are mildly elevated. She had a normal TTE less than 3 months ago and had no pericardial effusion at that time on TTE. Etiology of the pericardial effusion is likely related to URI that the patient has with viral pericarditis highest on the differential, although no other clinical signs of pericarditis. With unintentional weight loss and other lab abnormalities ie anemia and hyponatremia, malignancy is also on the differential, however, this is less likely with a TTE <3 months ago showing no effusion. No history of ERSD or Lupus or concerning features for SLE. No risk factors for TB - NPO for pericardiocentesis in AM - Monitor on telemetry  - Hold BP meds for now - Please send cytology with pericardial studies in addition cell count with diff, protein level, glucose, LDH, gram stain and culture - Agree with inflammatory  markers  Elevated troponin Elevated BNP The patient has a mildly elevated troponin that is flat at 29-> 28. No concern for ACS. Recent stress test showed mild ischemia in the Lcx distribution. Elevated troponin due to demand from current respiratory illness in the setting of possible coronary artery disease.  Risk Assessment/Risk Scores:     For questions or updates, please contact [facility] Please consult [email or web address] for contact info under    Signed, Jerrell DELENA Orchard, MD  12/09/2023 12:19 AM

## 2023-12-09 NOTE — Plan of Care (Signed)
  Problem: Education: Goal: Knowledge of General Education information will improve Description: Including pain rating scale, medication(s)/side effects and non-pharmacologic comfort measures Outcome: Progressing   Problem: Health Behavior/Discharge Planning: Goal: Ability to manage health-related needs will improve Outcome: Progressing   Problem: Clinical Measurements: Goal: Ability to maintain clinical measurements within normal limits will improve Outcome: Progressing Goal: Will remain free from infection Outcome: Progressing Goal: Diagnostic test results will improve Outcome: Progressing Goal: Respiratory complications will improve Outcome: Progressing Goal: Cardiovascular complication will be avoided Outcome: Progressing   Problem: Activity: Goal: Risk for activity intolerance will decrease Outcome: Progressing   Problem: Nutrition: Goal: Adequate nutrition will be maintained Outcome: Progressing   Problem: Coping: Goal: Level of anxiety will decrease Outcome: Progressing   Problem: Elimination: Goal: Will not experience complications related to bowel motility Outcome: Progressing Goal: Will not experience complications related to urinary retention Outcome: Progressing   Problem: Pain Managment: Goal: General experience of comfort will improve and/or be controlled Outcome: Progressing   Problem: Safety: Goal: Ability to remain free from injury will improve Outcome: Progressing   Problem: Skin Integrity: Goal: Risk for impaired skin integrity will decrease Outcome: Progressing   Problem: Education: Goal: Understanding of cardiac disease, CV risk reduction, and recovery process will improve Outcome: Progressing Goal: Individualized Educational Video(s) Outcome: Progressing   Problem: Activity: Goal: Ability to tolerate increased activity will improve Outcome: Progressing   Problem: Cardiac: Goal: Ability to achieve and maintain adequate cardiovascular  perfusion will improve Outcome: Progressing   Problem: Health Behavior/Discharge Planning: Goal: Ability to safely manage health-related needs after discharge will improve Outcome: Progressing   Problem: Education: Goal: Ability to describe self-care measures that may prevent or decrease complications (Diabetes Survival Skills Education) will improve Outcome: Progressing Goal: Individualized Educational Video(s) Outcome: Progressing   Problem: Coping: Goal: Ability to adjust to condition or change in health will improve Outcome: Progressing   Problem: Fluid Volume: Goal: Ability to maintain a balanced intake and output will improve Outcome: Progressing   Problem: Health Behavior/Discharge Planning: Goal: Ability to identify and utilize available resources and services will improve Outcome: Progressing Goal: Ability to manage health-related needs will improve Outcome: Progressing   Problem: Metabolic: Goal: Ability to maintain appropriate glucose levels will improve Outcome: Progressing   Problem: Nutritional: Goal: Maintenance of adequate nutrition will improve Outcome: Progressing Goal: Progress toward achieving an optimal weight will improve Outcome: Progressing   Problem: Skin Integrity: Goal: Risk for impaired skin integrity will decrease Outcome: Progressing   Problem: Tissue Perfusion: Goal: Adequacy of tissue perfusion will improve Outcome: Progressing

## 2023-12-09 NOTE — Interval H&P Note (Signed)
 History and Physical Interval Note:  12/09/2023 2:58 PM  Brittany Acosta  has presented today for surgery, with the diagnosis of ericardiocentesis.  The various methods of treatment have been discussed with the patient and family. After consideration of risks, benefits and other options for treatment, the patient has consented to  Procedure(s): PERICARDIOCENTESIS (N/A) as a surgical intervention.  The patient's history has been reviewed, patient examined, no change in status, stable for surgery.  I have reviewed the patient's chart and labs.  Questions were answered to the patient's satisfaction.     Ozell Fell

## 2023-12-09 NOTE — TOC Initial Note (Signed)
 Transition of Care [facility]) - Initial/Assessment Note    Patient Details  Name: Brittany Acosta MRN: [id number] Date of Birth: 03-Aug-1950  [facility] CM/SW Contact:    Sudie Erminio Deems, RN Phone Number: 12/09/2023, 11:55 AM  Clinical Narrative: Patient presented for shortness of breath. PTA patient was from home with spouse and has support of son. Patient has DME cane. Patient states she has a PCP and gets to appointments without any issues. Patient is not active with any home health at this time. Benefits check submitted for colchicine  $37.00. Case Manager will continue to follow for additional needs as she progresses.                 Expected Discharge Plan: Home/Self Care Barriers to Discharge: Continued Medical Work up   Patient Goals and CMS Choice Patient states their goals for this hospitalization and ongoing recovery are:: Plan to return home once stable.          Expected Discharge Plan and Services In-house Referral: NA Discharge Planning Services: CM Consult Post Acute Care Choice: NA Living arrangements for the past 2 months: Single Family Home                   DME Agency: NA       HH Arranged: NA          Prior Living Arrangements/Services Living arrangements for the past 2 months: Single Family Home Lives with:: Spouse Patient language and need for interpreter reviewed:: Yes        Need for Family Participation in Patient Care: No (Comment) Care giver support system in place?: No (comment) Current home services: DME (cane) Criminal Activity/Legal Involvement Pertinent to Current Situation/Hospitalization: No - Comment as needed  Activities of Daily Living   ADL Screening (condition at time of admission) Independently performs ADLs?: Yes (appropriate for developmental age) Is the patient deaf or have difficulty hearing?: No Does the patient have difficulty seeing, even when wearing glasses/contacts?: No Does the patient have  difficulty concentrating, remembering, or making decisions?: No  Permission Sought/Granted Permission sought to share information with : Case Manager, Family Supports                Emotional Assessment Appearance:: Appears stated age Attitude/Demeanor/Rapport: Engaged Affect (typically observed): Appropriate Orientation: : Oriented to Self, Oriented to Place Alcohol / Substance Use: Not Applicable Psych Involvement: No (comment)  Admission diagnosis:  Precordial pain [R07.2] Pericardial effusion [I31.39] Patient Active Problem List   Diagnosis Date Noted   Pericardial effusion 12/08/2023   Hyponatremia 12/08/2023   Hypomagnesemia 09/09/2022   B12 deficiency 09/09/2022   Iron deficiency anemia due to chronic blood loss 08/10/2022   Psoriatic arthropathy (HCC) 09/20/2021   Carpal tunnel syndrome of left wrist 07/02/2020   Pain of left hand 07/02/2020   Rheumatoid arthritis (HCC) 05/07/2020   Inflammatory polyarthritis (HCC) 05/07/2020   Bilateral leg edema 01/25/2020   Family history of heart disease 01/25/2020   Essential hypertension 01/25/2020   Type 2 diabetes mellitus with obesity (HCC) 01/25/2020   Right carotid bruit 12/25/2018   Stress incontinence in female 12/06/2018   Antalgic gait 12/02/2018   Malignant neoplasm of upper-outer quadrant of left breast in female, estrogen receptor positive (HCC) 05/10/2018   Temporomandibular joint (TMJ) pain 03/23/2018   Tophus of toe concurrent with and due to gout 12/16/2017   Mass in the abdomen 03/17/2017   Degenerative lumbar spinal stenosis 02/10/2017   Spondylolisthesis at L4-L5 level  02/10/2017   Cervical radiculopathy 01/18/2017   Nausea 12/23/2016   Depression, major, recurrent, moderate (HCC) 11/19/2016   Diabetic neuropathy, painful (HCC) 11/19/2016   Hypothyroidism, 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/19/2016   OSA (obstructive sleep apnea) 11/19/2016   PTSD (post-traumatic stress disorder) 11/19/2016   Chronic low back pain  11/19/2016   Gout, 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   Poorly controlled type 2 diabetes mellitus with circulatory disorder (HCC) 06/10/2015   Right bundle branch block 04/18/2015   Bifascicular block 04/18/2015   Radiculopathy, lumbar region 05/31/2014   Greater trochanteric bursitis of left hip 04/22/2014   Achilles tendinosis 02/19/2014   Gait instability 11/14/2013   Recurrent falls 11/14/2013   Multiple fractures 11/14/2013   Calculus of kidney 07/25/2013   Hypercalciuria 07/25/2013   Pain in joint involving ankle and foot 07/02/2013   Peripheral polyneuropathy 09/13/2012   Migraine 07/10/2012   Osteoarthritis of right knee 02/10/2012   Osteoarthritis of left knee 06/21/2011   GERD (gastroesophageal reflux disease) 02/09/2011   Vitamin D  deficiency 08/27/2010   Fibromyalgia 03/23/2010   Goiter 03/24/2009   Low serum potassium level 09/26/2008   Edema 09/06/2008   Morbid (severe) obesity due to excess calories (HCC) 09/03/2008   Hypothyroidism 06/10/2008   Hypercholesterolemia 06/10/2008   Bipolar 1 disorder (HCC) 06/10/2008   Hypertension associated with diabetes (HCC) 06/10/2008   Allergic rhinitis 06/10/2008   PCP:  [name], MD Pharmacy:   [facility] [address] [place] [address] Phone: [phone] Fax: [phone]  [facility] [address] [place] [address] Phone: [phone] Fax: [phone]  [facility] [address] [place], [place] - [address] [place] [address] Phone: [phone] Fax: [phone]  [facility] [address] [place] [address] Phone: [phone] Fax: [phone]     Social Drivers of Health (SDOH) Social History: SDOH Screenings   Food Insecurity: No Food Insecurity (12/08/2023)  Housing: Low Risk  (12/08/2023)  Transportation Needs: No Transportation  Needs (12/08/2023)  Utilities: Not At Risk (12/08/2023)  Depression (PHQ2-9): Low Risk  (11/22/2023)  Financial Resource Strain: Low Risk  (09/05/2019)   Received from [facility]  Physical Activity: Unknown (09/05/2019)   [facility]  Social Connections: Moderately Isolated (12/08/2023)  Stress: No Stress Concern Present (09/05/2019)   Received from [facility]  Tobacco Use: Medium Risk (12/09/2023)   SDOH Interventions:     Readmission Risk Interventions     No data to display

## 2023-12-09 NOTE — Progress Notes (Signed)
  Progress Note  Patient Name: Brittany Acosta Date of Encounter: 12/09/2023 [facility] Cardiologist: Shelda Bruckner, MD   Interval Summary    C/o cough this morning, no chest pain. Breathing is stable.   Vital Signs Vitals:   12/08/23 2130 12/08/23 2303 12/09/23 0359 12/09/23 0815  BP: (!) 145/83 138/71 (!) 151/76 127/71  Pulse: (!) 101 99 94 95  Resp: (!) 25 18 16 17   Temp:  99.1 F (37.3 C) 98.6 F (37 C) 98.2 F (36.8 C)  TempSrc:  Oral Oral Oral  SpO2: 100% 99% 96% 97%  Weight:  94.8 kg    Height:       No intake or output data in the 24 hours ending 12/09/23 0826    12/08/2023   11:03 PM 12/08/2023    3:43 PM 11/22/2023    4:11 PM  Last 3 Weights  Weight (lbs) 209 lb 214 lb 213 lb 2 oz  Weight (kg) 94.802 kg 97.07 kg 96.673 kg      Telemetry/ECG   Sinus Rhythm, tachycardic at times - Personally Reviewed  Physical Exam  GEN: No acute distress.   Neck: No JVD Cardiac: RRR, no murmurs, rubs, or gallops.  Respiratory: Clear to auscultation bilaterally. GI: Soft, nontender, non-distended  MS: No edema  Assessment & Plan   73 y.o. female with a hx of htn, OSA, DM, HLD, obesity, Breast Ca and fibromyalgia who was seen 12/09/2023 for the evaluation of pericardial effusion at the request of Dr. Verdene.   Pericardial effusion -- presented to the [facility] ED with cough, nausea and headache. Chest CTa ordered to evaluate for possible PNA, note moderate to large pericardial effusion -- echo 09/2023 LVEF of 55-60%, no rWMA, normal RV, no significant valvular disease, no pericardial effusion -- limited echo this morning pericardial effusion appears to be moderate in size, formal read pending -- CRP 23.1, Sed rate 55 -- will start colchicine  0.6mg  BID and ibuprofen  600mg  TID -- suspect pericardia effusion is 2/2 to recent URI symptoms but does have a hx of Breast Ca as well -- follow up regarding further management with MD, NPO for  now  Hypokalemia Hyponatremia -- Na + 129, K+ 3.3, poor PO intake for the past several days -- home diuretic held  Per Primary DM Left shoulder pain Hypothyroidism  Bipolar Disorder    For questions or updates, please contact [facility] Please consult [email or web address] for contact info under       Signed, Manuelita Rummer, NP

## 2023-12-09 NOTE — Plan of Care (Signed)
   Problem: Clinical Measurements: Goal: Respiratory complications will improve Outcome: Progressing   Problem: Activity: Goal: Risk for activity intolerance will decrease Outcome: Progressing   Problem: Nutrition: Goal: Adequate nutrition will be maintained Outcome: Progressing   Problem: Safety: Goal: Ability to remain free from injury will improve Outcome: Progressing

## 2023-12-09 NOTE — H&P (View-Only) (Signed)
  Progress Note  Patient Name: Brittany Acosta Date of Encounter: 12/09/2023 [facility] Cardiologist: Shelda Bruckner, MD   Interval Summary    C/o cough this morning, no chest pain. Breathing is stable.   Vital Signs Vitals:   12/08/23 2130 12/08/23 2303 12/09/23 0359 12/09/23 0815  BP: (!) 145/83 138/71 (!) 151/76 127/71  Pulse: (!) 101 99 94 95  Resp: (!) 25 18 16 17   Temp:  99.1 F (37.3 C) 98.6 F (37 C) 98.2 F (36.8 C)  TempSrc:  Oral Oral Oral  SpO2: 100% 99% 96% 97%  Weight:  94.8 kg    Height:       No intake or output data in the 24 hours ending 12/09/23 0826    12/08/2023   11:03 PM 12/08/2023    3:43 PM 11/22/2023    4:11 PM  Last 3 Weights  Weight (lbs) 209 lb 214 lb 213 lb 2 oz  Weight (kg) 94.802 kg 97.07 kg 96.673 kg      Telemetry/ECG   Sinus Rhythm, tachycardic at times - Personally Reviewed  Physical Exam  GEN: No acute distress.   Neck: No JVD Cardiac: RRR, no murmurs, rubs, or gallops.  Respiratory: Clear to auscultation bilaterally. GI: Soft, nontender, non-distended  MS: No edema  Assessment & Plan   73 y.o. female with a hx of htn, OSA, DM, HLD, obesity, Breast Ca and fibromyalgia who was seen 12/09/2023 for the evaluation of pericardial effusion at the request of Dr. Verdene.   Pericardial effusion -- presented to the [facility] ED with cough, nausea and headache. Chest CTa ordered to evaluate for possible PNA, note moderate to large pericardial effusion -- echo 09/2023 LVEF of 55-60%, no rWMA, normal RV, no significant valvular disease, no pericardial effusion -- limited echo this morning pericardial effusion appears to be moderate in size, formal read pending -- CRP 23.1, Sed rate 55 -- will start colchicine  0.6mg  BID and ibuprofen  600mg  TID -- suspect pericardia effusion is 2/2 to recent URI symptoms but does have a hx of Breast Ca as well -- follow up regarding further management with MD, NPO for  now  Hypokalemia Hyponatremia -- Na + 129, K+ 3.3, poor PO intake for the past several days -- home diuretic held  Per Primary DM Left shoulder pain Hypothyroidism  Bipolar Disorder    For questions or updates, please contact [facility] Please consult [email or web address] for contact info under       Signed, Manuelita Rummer, NP

## 2023-12-10 ENCOUNTER — Inpatient Hospital Stay (HOSPITAL_COMMUNITY)

## 2023-12-10 DIAGNOSIS — I3139 Other pericardial effusion (noninflammatory): Secondary | ICD-10-CM | POA: Diagnosis not present

## 2023-12-10 DIAGNOSIS — E039 Hypothyroidism, unspecified: Secondary | ICD-10-CM | POA: Diagnosis not present

## 2023-12-10 DIAGNOSIS — E1159 Type 2 diabetes mellitus with other circulatory complications: Secondary | ICD-10-CM | POA: Diagnosis not present

## 2023-12-10 DIAGNOSIS — E871 Hypo-osmolality and hyponatremia: Secondary | ICD-10-CM | POA: Diagnosis not present

## 2023-12-10 LAB — URINALYSIS, ROUTINE W REFLEX MICROSCOPIC
Bilirubin Urine: NEGATIVE
Glucose, UA: NEGATIVE mg/dL
Ketones, ur: NEGATIVE mg/dL
Nitrite: NEGATIVE
Protein, ur: NEGATIVE mg/dL
Specific Gravity, Urine: 1.012 (ref 1.005–1.030)
pH: 5 (ref 5.0–8.0)

## 2023-12-10 LAB — CBC WITH DIFFERENTIAL/PLATELET
Abs Immature Granulocytes: 0.14 K/uL — ABNORMAL HIGH (ref 0.00–0.07)
Basophils Absolute: 0.1 K/uL (ref 0.0–0.1)
Basophils Relative: 0 %
Eosinophils Absolute: 0.5 K/uL (ref 0.0–0.5)
Eosinophils Relative: 3 %
HCT: 29.9 % — ABNORMAL LOW (ref 36.0–46.0)
Hemoglobin: 9.8 g/dL — ABNORMAL LOW (ref 12.0–15.0)
Immature Granulocytes: 1 %
Lymphocytes Relative: 13 %
Lymphs Abs: 2.2 K/uL (ref 0.7–4.0)
MCH: 27.9 pg (ref 26.0–34.0)
MCHC: 32.8 g/dL (ref 30.0–36.0)
MCV: 85.2 fL (ref 80.0–100.0)
Monocytes Absolute: 1.7 K/uL — ABNORMAL HIGH (ref 0.1–1.0)
Monocytes Relative: 10 %
Neutro Abs: 12.1 K/uL — ABNORMAL HIGH (ref 1.7–7.7)
Neutrophils Relative %: 73 %
Platelets: 458 K/uL — ABNORMAL HIGH (ref 150–400)
RBC: 3.51 MIL/uL — ABNORMAL LOW (ref 3.87–5.11)
RDW: 15.1 % (ref 11.5–15.5)
WBC: 16.8 K/uL — ABNORMAL HIGH (ref 4.0–10.5)
nRBC: 0 % (ref 0.0–0.2)

## 2023-12-10 LAB — BASIC METABOLIC PANEL WITH GFR
Anion gap: 10 (ref 5–15)
BUN: 9 mg/dL (ref 8–23)
CO2: 23 mmol/L (ref 22–32)
Calcium: 8.1 mg/dL — ABNORMAL LOW (ref 8.9–10.3)
Chloride: 97 mmol/L — ABNORMAL LOW (ref 98–111)
Creatinine, Ser: 0.74 mg/dL (ref 0.44–1.00)
GFR, Estimated: >60 mL/min (ref >60)
Glucose, Bld: 178 mg/dL — ABNORMAL HIGH (ref 70–99)
Potassium: 4 mmol/L (ref 3.5–5.1)
Sodium: 130 mmol/L — ABNORMAL LOW (ref 135–145)

## 2023-12-10 LAB — ECHOCARDIOGRAM LIMITED
Height: 61.5 in
S' Lateral: 2.6 cm
Weight: 3344 [oz_av]

## 2023-12-10 LAB — MAGNESIUM: Magnesium: 1.7 mg/dL (ref 1.7–2.4)

## 2023-12-10 LAB — GLUCOSE, CAPILLARY
Glucose-Capillary: 160 mg/dL — ABNORMAL HIGH (ref 70–99)
Glucose-Capillary: 153 mg/dL — ABNORMAL HIGH (ref 70–99)
Glucose-Capillary: 94 mg/dL (ref 70–99)
Glucose-Capillary: 214 mg/dL — ABNORMAL HIGH (ref 70–99)

## 2023-12-10 LAB — SODIUM, URINE, RANDOM: Sodium, Ur: <30 mmol/L

## 2023-12-10 MED ORDER — VITAMIN B-12 100 MCG PO TABS
500.0000 ug | ORAL_TABLET | Freq: Every day | ORAL | Status: DC
  Administered 2023-12-11 – 2024-01-02 (×22): 500 ug via ORAL
  Filled 2023-12-10 (×4): qty 5
  Filled 2023-12-10: qty 1
  Filled 2023-12-10: qty 5
  Filled 2023-12-10: qty 1
  Filled 2023-12-10 (×12): qty 5
  Filled 2023-12-10 (×2): qty 1
  Filled 2023-12-10: qty 5

## 2023-12-10 MED ORDER — MAGNESIUM SULFATE 2 GM/50ML IV SOLN
2.0000 g | Freq: Once | INTRAVENOUS | Status: AC
  Administered 2023-12-10: 2 g via INTRAVENOUS
  Filled 2023-12-10: qty 50

## 2023-12-10 MED ORDER — FERROUS SULFATE 325 (65 FE) MG PO TABS
325.0000 mg | ORAL_TABLET | Freq: Every day | ORAL | Status: DC
  Administered 2023-12-11 – 2024-01-02 (×22): 325 mg via ORAL
  Filled 2023-12-10 (×23): qty 1

## 2023-12-10 MED ORDER — CYANOCOBALAMIN 1000 MCG/ML IJ SOLN
1000.0000 ug | Freq: Once | INTRAMUSCULAR | Status: AC
  Administered 2023-12-10: 1000 ug via INTRAMUSCULAR
  Filled 2023-12-10: qty 1

## 2023-12-10 MED ORDER — VITAMIN B-12 1000 MCG PO TABS
500.0000 ug | ORAL_TABLET | Freq: Every day | ORAL | Status: DC
  Filled 2023-12-10: qty 1

## 2023-12-10 NOTE — Progress Notes (Signed)
 [facility]                                                                               [name], is a 73 y.o. female, DOB - [birth year], FMW:[id number] Admit date - 12/08/2023    Outpatient Primary MD for the patient is [name], MD  LOS - 2  days    Brief summary   Brittany Acosta is a 73 y.o. female with a past medical history of diabetes mellitus type 2 on insulin , essential hypertension, Psoriatec arthropathy, history of gout, history of breast cancer, hyperlipidemia who was in her usual state of health till about 2 to 3 days ago when she started developing shortness of breath along with chest pain.  CT scan chest  which raise concern for moderate to large pericardial effusion. Case was discussed with cardiology who recommended hospitalization for further evaluation.  She was taken for OR for pericardiocentesis.  Assessment & Plan    Assessment and Plan:   Pericardial effusion:  Unclear etiology, differential include uremic etiology, vs viral infection.  Echocardiogram showed moderate effusion, with JVD and symptomatic with sob and intermittent chest pain.  Cardiology on board , following , underwent pericardiocentesis with 9/5 with out. Pericardial fluid sent for analysis, it was bloody in appearance. Gram stain is negative and cultures are pending.  Cytology was sent and pending.  She was empirically started on colchicine  0.6 mg BID along with ibuprofen  600 mg TID.     Hypokalemia Replaced, repeat in am.    Hyponatremia; Pt reports poor oral intake, lack of appetite due to sob and chest pain and weight loss.  Sodium has improved to 130 this morning. TSH slightly high, check am cortisol and serum osmolality and urine sodium.  Monitor.     Hypothyroidism:  Resume synthroid .   Type 2 DM with hyperglycemia:  CBG (last 3)  Recent Labs    12/09/23 2125 12/10/23 0651 12/10/23 1206  GLUCAP 236* 160* 153*   Resume SSI.     Hyperlipidemia Resume pravastatin  20 mg daily.    Bipolar disorder Resume Zoloft    Worsening leukocytosis of unclear etiology She remains afebrile. Check pro calcitonin.  Respiratory panel is negative. CTA chest is negative for pneumonia.  Pericardial fluid sent for analysis and gram stain is negative, cultures are pending.  Send UA .    Normocytic anemia Baseline hemoglobin appears to be around 10.  Hemoglobin around 9.8 today.     Estimated body mass index is 38.85 kg/m as calculated from the following:   Height as of this encounter: 5' 1.5 (1.562 m).   Weight as of this encounter: 94.8 kg.  Code Status: full code.  DVT Prophylaxis:  SCD's Start: 12/09/23 1559 SCDs Start: 12/08/23 2335   Level of Care: Level of care: ICU Family Communication: none at bedside.   Disposition Plan:     Remains inpatient appropriate:  pending clinical improvement.   Procedures:  Pericardiocentesis.   Consultants:   Cardiology.  Antimicrobials:   Anti-infectives (From admission, onward)    None        Medications  Scheduled Meds:  allopurinol   300 mg Oral Daily  Chlorhexidine  Gluconate Cloth  6 each Topical Daily   colchicine   0.6 mg Oral BID   doxepin   10 mg Oral Daily   [START ON 12/11/2023] ferrous sulfate   325 mg Oral Q breakfast   ibuprofen   600 mg Oral TID   insulin  aspart  0-15 Units Subcutaneous TID WC   lamoTRIgine   200 mg Oral Q1200   levothyroxine   50 mcg Oral Daily   oxybutynin   10 mg Oral QHS   pantoprazole   40 mg Oral Daily   pravastatin   20 mg Oral QHS   sertraline   100 mg Oral QHS   sodium chloride  flush  3 mL Intravenous Q12H   Continuous Infusions:  sodium chloride      PRN Meds:.sodium chloride , acetaminophen  **OR** acetaminophen , dextromethorphan , morphine  injection, ondansetron  **OR** ondansetron  (ZOFRAN ) IV, sodium chloride  flush    Subjective:   Brittany Acosta was seen and examined today.  Reports feeling cold.  Objective:    Vitals:   12/10/23 0830 12/10/23 0900 12/10/23 0915 12/10/23 1204  BP: (!) 153/84 (!) 161/134 (!) 142/99   Pulse: 85 78    Resp: (!) 24 (!) 21    Temp:    98.1 F (36.7 C)  TempSrc:    Oral  SpO2: 98% (!) 89%    Weight:      Height:        Intake/Output Summary (Last 24 hours) at 12/10/2023 1528 Last data filed at 12/10/2023 0100 Gross per 24 hour  Intake 1963.66 ml  Output 60 ml  Net 1903.66 ml   Filed Weights   12/08/23 1543 12/08/23 2303  Weight: 97.1 kg 94.8 kg     Exam General exam: Appears calm and comfortable  Respiratory system: Clear to auscultation. Respiratory effort normal.s/p pericardial drain.  Cardiovascular system: S1 & S2 heard, RRR. No JVD,  Gastrointestinal system: Abdomen is nondistended, soft and nontender. Central nervous system: Alert and oriented.  Extremities: no edema.  Skin: No rashes, Psychiatry: Mood & affect appropriate.      Data Reviewed:  I have personally reviewed following labs and imaging studies   CBC Lab Results  Component Value Date   WBC 16.8 (H) 12/10/2023   RBC 3.51 (L) 12/10/2023   HGB 9.8 (L) 12/10/2023   HCT 29.9 (L) 12/10/2023   MCV 85.2 12/10/2023   MCH 27.9 12/10/2023   PLT 458 (H) 12/10/2023   MCHC 32.8 12/10/2023   RDW 15.1 12/10/2023   LYMPHSABS 2.2 12/10/2023   MONOABS 1.7 (H) 12/10/2023   EOSABS 0.5 12/10/2023   BASOSABS 0.1 12/10/2023     Last metabolic panel Lab Results  Component Value Date   NA 130 (L) 12/10/2023   K 4.0 12/10/2023   CL 97 (L) 12/10/2023   CO2 23 12/10/2023   BUN 9 12/10/2023   CREATININE 0.74 12/10/2023   GLUCOSE 178 (H) 12/10/2023   GFRNONAA >60 12/10/2023   GFRAA >60 09/14/2019   CALCIUM  8.1 (L) 12/10/2023   PROT 6.1 (L) 12/09/2023   ALBUMIN  2.8 (L) 12/09/2023   BILITOT 0.5 12/09/2023   ALKPHOS 78 12/09/2023   AST 14 (L) 12/09/2023   ALT 11 12/09/2023   ANIONGAP 10 12/10/2023    CBG (last 3)  Recent Labs    12/09/23 2125 12/10/23 0651 12/10/23 1206   GLUCAP 236* 160* 153*      Coagulation Profile: No results for input(s): INR, PROTIME in the last 168 hours.   Radiology Studies: ECHOCARDIOGRAM LIMITED Result Date: 12/10/2023    ECHOCARDIOGRAM LIMITED REPORT  Patient Name:   Brittany Acosta Date of Exam: 12/10/2023 Medical Rec #:  [id number]     Height:       61.5 in Accession #:    [id number]    Weight:       209.0 lb Date of Birth:  October 15, 1950      BSA:          1.936 m Patient Age:    73 years      BP:           142/99 mmHg Patient Gender: F             HR:           93 bpm. Exam Location:  Inpatient Procedure: Limited Echo (Both Spectral and Color Flow Doppler were utilized            during procedure). Indications:    pericardial effusion  History:        Patient has prior history of Echocardiogram examinations, most                 recent 12/09/2023. Arrythmias:RBBB and Atrial Fibrillation,                 Signs/Symptoms:Edema; Risk Factors:Sleep Apnea, Diabetes,                 Hypertension and Dyslipidemia.  Sonographer:    Tinnie Barefoot RDCS Referring Phys: [phone] MICHAEL COOPER IMPRESSIONS  1. Limited Echo to evaluate for pericardial effusion, s/p pericardiocentesis on 12/09/2023.  2. No evidence of pericardial effusion.  3. Left ventricular ejection fraction, by estimation, is 60 to 65%. The left ventricle has normal function. The left ventricle has no regional wall motion abnormalities.  4. Right ventricular systolic function is normal. The right ventricular size is normal. There is normal pulmonary artery systolic pressure.  5. The inferior vena cava is normal in size with greater than 50% respiratory variability, suggesting right atrial pressure of 3 mmHg. FINDINGS  Left Ventricle: Left ventricular ejection fraction, by estimation, is 60 to 65%. The left ventricle has normal function. The left ventricle has no regional wall motion abnormalities. The left ventricular internal cavity size was normal in size. There is  no left ventricular  hypertrophy. Right Ventricle: The right ventricular size is normal. No increase in right ventricular wall thickness. Right ventricular systolic function is normal. There is normal pulmonary artery systolic pressure. The tricuspid regurgitant velocity is 2.50 m/s, and  with an assumed right atrial pressure of 3 mmHg, the estimated right ventricular systolic pressure is 28.0 mmHg. Left Atrium: Left atrial size was normal in size. Right Atrium: Right atrial size was normal in size. Pericardium: There is no evidence of pericardial effusion. Mitral Valve: The mitral valve is normal in structure. No evidence of mitral valve stenosis. Tricuspid Valve: The tricuspid valve is normal in structure. Tricuspid valve regurgitation is trivial. No evidence of tricuspid stenosis. Aortic Valve: The aortic valve has an indeterminant number of cusps. Aortic valve regurgitation is not visualized. No aortic stenosis is present. Pulmonic Valve: The pulmonic valve was normal in structure. Pulmonic valve regurgitation is not visualized. No evidence of pulmonic stenosis. Aorta: Aortic root could not be assessed. Venous: The inferior vena cava is normal in size with greater than 50% respiratory variability, suggesting right atrial pressure of 3 mmHg. IAS/Shunts: No atrial level shunt detected by color flow Doppler. Additional Comments: Spectral Doppler performed. Color Doppler performed.  LEFT VENTRICLE PLAX 2D LVIDd:  4.20 cm LVIDs:         2.60 cm LV PW:         1.00 cm LV IVS:        1.10 cm  LEFT ATRIUM         Index LA diam:    3.60 cm 1.86 cm/m  AORTIC VALVE LVOT Vmax:   131.00 cm/s LVOT Vmean:  87.067 cm/s LVOT VTI:    0.220 m TRICUSPID VALVE TR Peak grad:   25.0 mmHg TR Vmax:        250.00 cm/s  SHUNTS Systemic VTI: 0.22 m Vishnu Priya Mallipeddi Electronically signed by Diannah Late Mallipeddi Signature Date/Time: 12/10/2023/11:28:08 AM    Final    ECHOCARDIOGRAM LIMITED Result Date: 12/09/2023    ECHOCARDIOGRAM LIMITED  REPORT   Patient Name:   RESHONDA KOERBER Date of Exam: 12/09/2023 Medical Rec #:  [id number]     Height:       61.5 in Accession #:    [id number]    Weight:       209.0 lb Date of Birth:  1950-04-16      BSA:          1.936 m Patient Age:    73 years      BP:           140/70 mmHg Patient Gender: F             HR:           93 bpm. Exam Location:  Inpatient Procedure: Limited Echo (Both Spectral and Color Flow Doppler were utilized            during procedure). Indications:    Pericardiocentisis  History:        Patient has prior history of Echocardiogram examinations, most                 recent 12/09/2023. Pericardial Disease.  Sonographer:    Jayson Gaskins Referring Phys: [phone] MICHAEL COOPER IMPRESSIONS  1. Left ventricular ejection fraction, by estimation, is 60 to 65%. The left ventricle has normal function.  2. Limited Echocardiogram - Echo guided Pericardiocentisis.  3. Pre-procedure: Moderate size, circumferential, RA collapse.  4. 700cc of fluid drained.  5. Post-procedure: LVEF 60-65% and no pericardial effusion noted on apical 4 image. FINDINGS  Left Ventricle: Left ventricular ejection fraction, by estimation, is 60 to 65%. The left ventricle has normal function. [employer] Electronically signed by Madonna Large Signature Date/Time: 12/09/2023/4:46:06 PM    Final    CARDIAC CATHETERIZATION Result Date: 12/09/2023 Successful pericardiocentesis under fluoroscopic and echo guidance with removal of 700 cc bloody pericardial fluid Recommend: drain secured in place. Monitor output, limited echo in am. Fluid sent to lab for analysis.   ECHOCARDIOGRAM LIMITED Result Date: 12/09/2023    ECHOCARDIOGRAM LIMITED REPORT   Patient Name:   ASHANTA AMOROSO Date of Exam: 12/09/2023 Medical Rec #:  [id number]     Height:       61.5 in Accession #:    [id number]    Weight:       209.0 lb Date of Birth:  Nov 03, 1950      BSA:          1.936 m Patient Age:    73 years      BP:           151/76 mmHg Patient Gender: F             HR:  88 bpm. Exam Location:  Inpatient Procedure: 2D Echo, Limited Echo, Cardiac Doppler and Color Doppler (Both            Spectral and Color Flow Doppler were utilized during procedure). Indications:    Pericardial effusion  History:        Patient has prior history of Echocardiogram examinations, most                 recent 09/28/2023. Risk Factors:Hypertension, Diabetes,                 Dyslipidemia and Sleep Apnea.  Sonographer:    Therisa Crouch Referring Phys: [id number] JOETTE PEBBLES IMPRESSIONS  1. Limited Echocardiogram, see echo dataed 09/29/2023 for additional details.  2. Left ventricular ejection fraction, by estimation, is 65 to 70%. The left ventricle has normal function.  3. Moderate size pericardial effusion, circumferential, right atrial collapse in systole, respiratory variation across the mitral valve approximately 25%, but IVC compressible. Findings to suggest possible early tamponade physiology. Patient is hypertensive and HR about 90-100bpm. Clinical correlation required - if considering conservative measure recommend repeating echo in 24hr or sooner if change in clinical status. Will update rounding cardiology team.  4. The inferior vena cava is normal in size with greater than 50% respiratory variability, suggesting right atrial pressure of 3 mmHg. Comparison(s): A prior study was performed on 09/29/2023. Pericardial effusion is new. FINDINGS  Left Ventricle: Left ventricular ejection fraction, by estimation, is 65 to 70%. The left ventricle has normal function. Pericardium: Moderate size pericardial effusion, circumferential, right atrial collapse in systole, respiratory variation across the mitral valve approximately 25%, but IVC compressible. Findings to suggest possible early tamponade physiology. Patient is  hypertensive and HR about 90-100bpm. Clinical correlation required - if considering conservative measure recommend repeating echo in 24hr or sooner if change in clinical status. Will update  rounding cardiology team. Venous: The inferior vena cava is normal in size with greater than 50% respiratory variability, suggesting right atrial pressure of 3 mmHg.  LV Volumes (MOD) LV vol d, MOD A2C: 48.6 ml LV vol d, MOD A4C: 55.3 ml LV vol s, MOD A2C: 13.9 ml LV vol s, MOD A4C: 19.1 ml LV SV MOD A2C:     34.7 ml LV SV MOD A4C:     55.3 ml LV SV MOD BP:      35.9 ml IVC IVC diam: 1.80 cm Sunit Tolia Electronically signed by Madonna Large Signature Date/Time: 12/09/2023/10:27:03 AM    Final    CT Angio Chest PE W and/or Wo Contrast Result Date: 12/08/2023 CLINICAL DATA:  High probability for PE.  Chest pain. EXAM: CT ANGIOGRAPHY CHEST WITH CONTRAST TECHNIQUE: Multidetector CT imaging of the chest was performed using the standard protocol during bolus administration of intravenous contrast. Multiplanar CT image reconstructions and MIPs were obtained to evaluate the vascular anatomy. RADIATION DOSE REDUCTION: This exam was performed according to the departmental dose-optimization program which includes automated exposure control, adjustment of the mA and/or kV according to patient size and/or use of iterative reconstruction technique. CONTRAST:  75mL OMNIPAQUE  IOHEXOL  350 MG/ML SOLN COMPARISON:  None Available. FINDINGS: Cardiovascular: Satisfactory opacification of the pulmonary arteries to the segmental level. No evidence of pulmonary embolism. Normal heart size. There is a moderate-to-large sized pericardial effusion. Mediastinum/Nodes: No enlarged mediastinal, hilar, or axillary lymph nodes. Thyroid  gland, trachea, and esophagus demonstrate no significant findings. Lungs/Pleura: There is a trace left pleural effusion. There is minimal atelectasis in the left lower lobe. The lungs  are otherwise clear. There is no pneumothorax. Upper Abdomen: No acute abnormality. Cholecystectomy clips are present. Musculoskeletal: There severe degenerative changes of the left shoulder. Right shoulder arthroplasty is present.  Review of the MIP images confirms the above findings. IMPRESSION: 1. No evidence for pulmonary embolism. 2. Moderate-to-large sized pericardial effusion. 3. Trace left pleural effusion. Electronically Signed   By: Greig Pique M.D.   On: 12/08/2023 19:03   DG Chest 2 View Result Date: 12/08/2023 CLINICAL DATA:  Chest pain. EXAM: CHEST - 2 VIEW COMPARISON:  Chest radiograph dated 01/29/2021. FINDINGS: No focal consolidation, pleural effusion or pneumothorax. Top-normal cardiac size. No acute osseous pathology. Over shoulder arthroplasty. IMPRESSION: No active cardiopulmonary disease. Electronically Signed   By: Vanetta Chou M.D.   On: 12/08/2023 17:23       Elgie Butter M.D. [facility] 12/10/2023, 3:28 PM  Available via Epic secure chat 7am-7pm After 7 pm, please refer to night coverage provider listed on amion.

## 2023-12-10 NOTE — Progress Notes (Signed)
  Echocardiogram 2D Echocardiogram has been performed.  Brittany Acosta 12/10/2023, 10:55 AM

## 2023-12-10 NOTE — Progress Notes (Signed)
  Progress Note  Patient Name: Brittany Acosta Date of Encounter: 12/10/2023 [facility] Cardiologist: Brittany Bruckner, MD   Interval Summary   Patient feeling well.  Continues to have drainage from her pericardial drain.  Echo pending.  No acute complaints.  Vital Signs Vitals:   12/10/23 0615 12/10/23 0630 12/10/23 0645 12/10/23 0827  BP: 131/73 121/63 125/72   Pulse: 81 77 77   Resp: (!) 24 (!) 25 (!) 24   Temp:    98.2 F (36.8 C)  TempSrc:    Oral  SpO2: 99% 100% 100%   Weight:      Height:        Intake/Output Summary (Last 24 hours) at 12/10/2023 0832 Last data filed at 12/10/2023 0100 Gross per 24 hour  Intake 1963.66 ml  Output 60 ml  Net 1903.66 ml      12/08/2023   11:03 PM 12/08/2023    3:43 PM 11/22/2023    4:11 PM  Last 3 Weights  Weight (lbs) 209 lb 214 lb 213 lb 2 oz  Weight (kg) 94.802 kg 97.07 kg 96.673 kg      Telemetry/ECG  Sinus rhythm with PACs- Personally Reviewed  Physical Exam  GEN: No acute distress.   Neck: No JVD Cardiac: RRR, no murmurs, rubs, or gallops.  Respiratory: Clear to auscultation bilaterally. GI: Soft, nontender, non-distended  MS: No edema  Assessment & Plan  1.  Pericardial effusion: Post pericardiocentesis performed yesterday with 700 cc out.  Continues to drain.  Brittany Acosta continue drain in place for now.  Limited echo pending today.  On colchicine  and ibuprofen .  2.  Hypokalemia/hyponatremia: Holding diuretics.  Per primary team Diabetes Left shoulder pain Hypothyroidism Bipolar disorder  For questions or updates, please contact [facility] Please consult [email or web address] for contact info under       Signed, Brittany Shovlin Gladis Norton, MD ;

## 2023-12-11 ENCOUNTER — Inpatient Hospital Stay (HOSPITAL_COMMUNITY)

## 2023-12-11 ENCOUNTER — Encounter (HOSPITAL_COMMUNITY): Payer: Self-pay | Admitting: Cardiovascular Disease

## 2023-12-11 DIAGNOSIS — E1159 Type 2 diabetes mellitus with other circulatory complications: Secondary | ICD-10-CM | POA: Diagnosis not present

## 2023-12-11 DIAGNOSIS — I3139 Other pericardial effusion (noninflammatory): Secondary | ICD-10-CM | POA: Diagnosis not present

## 2023-12-11 DIAGNOSIS — E039 Hypothyroidism, unspecified: Secondary | ICD-10-CM | POA: Diagnosis not present

## 2023-12-11 DIAGNOSIS — E871 Hypo-osmolality and hyponatremia: Secondary | ICD-10-CM | POA: Diagnosis not present

## 2023-12-11 LAB — CBC WITH DIFFERENTIAL/PLATELET
Abs Immature Granulocytes: 0.07 K/uL (ref 0.00–0.07)
Basophils Absolute: 0.1 K/uL (ref 0.0–0.1)
Basophils Relative: 0 %
Eosinophils Absolute: 0.7 K/uL — ABNORMAL HIGH (ref 0.0–0.5)
Eosinophils Relative: 4 %
HCT: 28.9 % — ABNORMAL LOW (ref 36.0–46.0)
Hemoglobin: 9.8 g/dL — ABNORMAL LOW (ref 12.0–15.0)
Immature Granulocytes: 1 %
Lymphocytes Relative: 13 %
Lymphs Abs: 2 K/uL (ref 0.7–4.0)
MCH: 28.3 pg (ref 26.0–34.0)
MCHC: 33.9 g/dL (ref 30.0–36.0)
MCV: 83.5 fL (ref 80.0–100.0)
Monocytes Absolute: 1.4 K/uL — ABNORMAL HIGH (ref 0.1–1.0)
Monocytes Relative: 9 %
Neutro Abs: 10.9 K/uL — ABNORMAL HIGH (ref 1.7–7.7)
Neutrophils Relative %: 73 %
Platelets: 483 K/uL — ABNORMAL HIGH (ref 150–400)
RBC: 3.46 MIL/uL — ABNORMAL LOW (ref 3.87–5.11)
RDW: 14.7 % (ref 11.5–15.5)
WBC: 15 K/uL — ABNORMAL HIGH (ref 4.0–10.5)
nRBC: 0 % (ref 0.0–0.2)

## 2023-12-11 LAB — RESPIRATORY PANEL BY PCR

## 2023-12-11 LAB — PROTEIN, BODY FLUID (OTHER): Total Protein, Body Fluid Other: 5 g/dL

## 2023-12-11 LAB — CORTISOL: Cortisol, Plasma: 8.8 ug/dL

## 2023-12-11 LAB — BASIC METABOLIC PANEL WITH GFR
Anion gap: 11 (ref 5–15)
BUN: 9 mg/dL (ref 8–23)
CO2: 22 mmol/L (ref 22–32)
Calcium: 8.6 mg/dL — ABNORMAL LOW (ref 8.9–10.3)
Chloride: 96 mmol/L — ABNORMAL LOW (ref 98–111)
Creatinine, Ser: 0.88 mg/dL (ref 0.44–1.00)
GFR, Estimated: >60 mL/min (ref >60)
Glucose, Bld: 158 mg/dL — ABNORMAL HIGH (ref 70–99)
Potassium: 4.1 mmol/L (ref 3.5–5.1)
Sodium: 129 mmol/L — ABNORMAL LOW (ref 135–145)

## 2023-12-11 LAB — LD, BODY FLUID (OTHER): LD, Body Fluid: 1146 IU/L

## 2023-12-11 LAB — GLUCOSE, CAPILLARY
Glucose-Capillary: 159 mg/dL — ABNORMAL HIGH (ref 70–99)
Glucose-Capillary: 236 mg/dL — ABNORMAL HIGH (ref 70–99)
Glucose-Capillary: 108 mg/dL — ABNORMAL HIGH (ref 70–99)
Glucose-Capillary: 223 mg/dL — ABNORMAL HIGH (ref 70–99)

## 2023-12-11 LAB — GLUCOSE, BODY FLUID OTHER: Glucose, Body Fluid Other: 57 mg/dL

## 2023-12-11 LAB — OSMOLALITY: Osmolality: 271 mosm/kg — ABNORMAL LOW (ref 275–295)

## 2023-12-11 LAB — OSMOLALITY, URINE: Osmolality, Ur: 187 mosm/kg — ABNORMAL LOW (ref 300–900)

## 2023-12-11 LAB — T4, FREE: Free T4: 1.39 ng/dL — ABNORMAL HIGH (ref 0.61–1.12)

## 2023-12-11 LAB — PROCALCITONIN: Procalcitonin: <0.1 ng/mL

## 2023-12-11 MED ORDER — BENZONATATE 100 MG PO CAPS
200.0000 mg | ORAL_CAPSULE | Freq: Two times a day (BID) | ORAL | Status: DC | PRN
  Administered 2023-12-11 – 2023-12-12 (×4): 200 mg via ORAL
  Filled 2023-12-11 (×4): qty 2

## 2023-12-11 MED ORDER — CYCLOBENZAPRINE HCL 10 MG PO TABS
5.0000 mg | ORAL_TABLET | Freq: Three times a day (TID) | ORAL | Status: DC | PRN
  Administered 2023-12-11 – 2023-12-12 (×3): 5 mg via ORAL
  Filled 2023-12-11 (×3): qty 1

## 2023-12-11 MED ORDER — CYCLOBENZAPRINE HCL 10 MG PO TABS
10.0000 mg | ORAL_TABLET | Freq: Once | ORAL | Status: AC
  Administered 2023-12-11: 10 mg via ORAL
  Filled 2023-12-11: qty 1

## 2023-12-11 MED ORDER — GUAIFENESIN-DM 100-10 MG/5ML PO SYRP
10.0000 mL | ORAL_SOLUTION | ORAL | Status: DC | PRN
  Administered 2023-12-12 – 2023-12-13 (×4): 10 mL via ORAL
  Filled 2023-12-11 (×4): qty 10

## 2023-12-11 NOTE — Progress Notes (Signed)
  Progress Note  Patient Name: Brittany Acosta Date of Encounter: 12/11/2023 [facility] Cardiologist: Shelda Bruckner, MD   Interval Summary   Mild cough but otherwise no acute complaints.  Vital Signs Vitals:   12/11/23 0315 12/11/23 0500 12/11/23 0600 12/11/23 0700  BP:  (!) 132/55 134/63   Pulse:  82 85   Resp:  20 (!) 26   Temp: 98.7 F (37.1 C)   (!) 96.6 F (35.9 C)  TempSrc: Oral   Axillary  SpO2:  95% 95%   Weight:      Height:        Intake/Output Summary (Last 24 hours) at 12/11/2023 0743 Last data filed at 12/11/2023 0100 Gross per 24 hour  Intake 42.3 ml  Output 250 ml  Net -207.7 ml      12/08/2023   11:03 PM 12/08/2023    3:43 PM 11/22/2023    4:11 PM  Last 3 Weights  Weight (lbs) 209 lb 214 lb 213 lb 2 oz  Weight (kg) 94.802 kg 97.07 kg 96.673 kg      Telemetry/ECG  Sinus rhythm with PACs- Personally Reviewed  Physical Exam  GEN: No acute distress.   Neck: No JVD Cardiac: RRR, no murmurs, rubs, or gallops.  Respiratory: Clear to auscultation bilaterally. GI: Soft, nontender, non-distended  MS: No edema  Assessment & Plan   Pericardial effusion: Post pericardiocentesis.  Continues to drain 150 cc every 6-9 hours.  Ranson Belluomini leave the drain in place for now.  Echo yesterday without effusion.  Continue ibuprofen  and colchicine .     For questions or updates, please contact [facility] Please consult [email or web address] for contact info under       Signed, Audree Schrecengost Gladis Norton, MD

## 2023-12-11 NOTE — Progress Notes (Signed)
 [facility] night cross cover note:   I was notified by the patient's RN of the pt's request for flexeril  for muscle spasms.  She had previously had a one-time dose of Flexeril  for muscle spasms approximately 16 hours ago, with improvement in her symptoms.  I subsequently placed order for Flexeril  5 mg p.o. 3 times daily as needed for muscle spasms.    Eva Pore, DO Hospitalist

## 2023-12-11 NOTE — Progress Notes (Signed)
 [facility]                                                                               Brittany Acosta, is a 73 y.o. female, DOB - [birth year], FMW:[id number] Admit date - 12/08/2023    Outpatient Primary MD for the patient is [name], MD  LOS - 3  days    Brief summary   Brittany Acosta is a 73 y.o. female with a past medical history of diabetes mellitus type 2 on insulin , essential hypertension, Psoriatec arthropathy, history of gout, history of breast cancer, hyperlipidemia who was in her usual state of health till about 2 to 3 days ago when she started developing shortness of breath along with chest pain.  CT scan chest  which raise concern for moderate to large pericardial effusion. Case was discussed with cardiology who recommended hospitalization for further evaluation.  She was taken for OR for pericardiocentesis.  Assessment & Plan    Assessment and Plan:   Pericardial effusion:  Unclear etiology, differential include uremic etiology, vs viral infection.  Echocardiogram showed moderate effusion, with JVD and symptomatic with sob and intermittent chest pain.  Cardiology on board , following , underwent pericardiocentesis with 9/5 with out. Pericardial fluid sent for analysis, it was bloody in appearance. Gram stain is negative and cultures are pending.  Cytology was sent and pending.  She was empirically started on colchicine  0.6 mg BID along with ibuprofen  600 mg TID.  Repeat echo shows resolution of the pericardial effusion, the drain remains in place as it is draining.     Hypokalemia Replaced.   Hyponatremia; Pt reports poor oral intake, lack of appetite due to sob and chest pain and weight loss.  Sodium just hovering at 129 to 130.  TSH slightly high, free t4 elevated. Urine sodium is less than 30, and urine osmolality is 187. Am cortisol is 8.8  Suspect from medications , she remains asymptomatic.     Hypothyroidism:  Resume  synthroid .   Type 2 DM with hyperglycemia:  CBG (last 3)  Recent Labs    12/10/23 2110 12/11/23 0614 12/11/23 1123  GLUCAP 214* 159* 236*   Resume SSI , hemoglobin A1c is 5.6%/    Hyperlipidemia Resume pravastatin  20 mg daily.    Bipolar disorder Resume Zoloft    Worsening leukocytosis of unclear etiology She remains afebrile. Procalcitonin is negative Respiratory panel is negative. CTA chest is negative for pneumonia.  Pericardial fluid sent for analysis and gram stain is negative, cultures are pending.  UA shows mod leukocytes, with rare bacteria.  Get CXR as she continues to have dry cough. Will get respiratory panel    Normocytic anemia Baseline hemoglobin appears to be around 10.  Hemoglobin around 9.8 today.   Vitamin b12 deficiency/ Iron deficiency anemia Replacements ordered.     Estimated body mass index is 38.85 kg/m as calculated from the following:   Height as of this encounter: 5' 1.5 (1.562 m).   Weight as of this encounter: 94.8 kg.  Code Status: full code.  DVT Prophylaxis:  SCD's Start: 12/09/23 1559 SCDs Start: 12/08/23 2335   Level of Care: Level of care:  ICU Family Communication: none at bedside.   Disposition Plan:     Remains inpatient appropriate:  pending clinical improvement.   Procedures:  Pericardiocentesis.   Consultants:   Cardiology.  Antimicrobials:   Anti-infectives (From admission, onward)    None        Medications  Scheduled Meds:  allopurinol   300 mg Oral Daily   Chlorhexidine  Gluconate Cloth  6 each Topical Daily   colchicine   0.6 mg Oral BID   vitamin B-12  500 mcg Oral Daily   doxepin   10 mg Oral Daily   ferrous sulfate   325 mg Oral Q breakfast   ibuprofen   600 mg Oral TID   insulin  aspart  0-15 Units Subcutaneous TID WC   lamoTRIgine   200 mg Oral Q1200   levothyroxine   50 mcg Oral Daily   oxybutynin   10 mg Oral QHS   pantoprazole   40 mg Oral Daily   pravastatin   20 mg Oral QHS   sertraline    100 mg Oral QHS   sodium chloride  flush  3 mL Intravenous Q12H   Continuous Infusions:   PRN Meds:.acetaminophen  **OR** acetaminophen , benzonatate , guaiFENesin -dextromethorphan , morphine  injection, ondansetron  **OR** ondansetron  (ZOFRAN ) IV, sodium chloride  flush    Subjective:   Brittany Acosta was seen and examined today.  Coughing a bit.  Objective:   Vitals:   12/11/23 1000 12/11/23 1100 12/11/23 1200 12/11/23 1500  BP: 138/62     Pulse: 85  89   Resp: (!) 22  20   Temp:  97.7 F (36.5 C)  (!) 97.4 F (36.3 C)  TempSrc:  Axillary  Axillary  SpO2: 97%  95%   Weight:      Height:        Intake/Output Summary (Last 24 hours) at 12/11/2023 1556 Last data filed at 12/11/2023 1500 Gross per 24 hour  Intake 42.3 ml  Output 1050 ml  Net -1007.7 ml   Filed Weights   12/08/23 1543 12/08/23 2303  Weight: 97.1 kg 94.8 kg     Exam General exam: Appears calm and comfortable  Respiratory system: Clear to auscultation. Respiratory effort normal. Cardiovascular system: S1 & S2 heard, RRR. No JVD,  Gastrointestinal system: Abdomen is nondistended, soft and nontender.  Central nervous system: Alert and oriented.  Extremities: no pedal edema.  Skin: No rashes, Psychiatry: Mood & affect appropriate.      Data Reviewed:  I have personally reviewed following labs and imaging studies   CBC Lab Results  Component Value Date   WBC 15.0 (H) 12/11/2023   RBC 3.46 (L) 12/11/2023   HGB 9.8 (L) 12/11/2023   HCT 28.9 (L) 12/11/2023   MCV 83.5 12/11/2023   MCH 28.3 12/11/2023   PLT 483 (H) 12/11/2023   MCHC 33.9 12/11/2023   RDW 14.7 12/11/2023   LYMPHSABS 2.0 12/11/2023   MONOABS 1.4 (H) 12/11/2023   EOSABS 0.7 (H) 12/11/2023   BASOSABS 0.1 12/11/2023     Last metabolic panel Lab Results  Component Value Date   NA 129 (L) 12/11/2023   K 4.1 12/11/2023   CL 96 (L) 12/11/2023   CO2 22 12/11/2023   BUN 9 12/11/2023   CREATININE 0.88 12/11/2023   GLUCOSE 158 (H)  12/11/2023   GFRNONAA >60 12/11/2023   GFRAA >60 09/14/2019   CALCIUM  8.6 (L) 12/11/2023   PROT 6.1 (L) 12/09/2023   ALBUMIN  2.8 (L) 12/09/2023   BILITOT 0.5 12/09/2023   ALKPHOS 78 12/09/2023   AST 14 (L) 12/09/2023   ALT 11  12/09/2023   ANIONGAP 11 12/11/2023    CBG (last 3)  Recent Labs    12/10/23 2110 12/11/23 0614 12/11/23 1123  GLUCAP 214* 159* 236*      Coagulation Profile: No results for input(s): INR, PROTIME in the last 168 hours.   Radiology Studies: ECHOCARDIOGRAM LIMITED Result Date: 12/10/2023    ECHOCARDIOGRAM LIMITED REPORT   Patient Name:   Brittany Acosta Date of Exam: 12/10/2023 Medical Rec #:  [id number]     Height:       61.5 in Accession #:    [id number]    Weight:       209.0 lb Date of Birth:  1950/10/24      BSA:          1.936 m Patient Age:    73 years      BP:           142/99 mmHg Patient Gender: F             HR:           93 bpm. Exam Location:  Inpatient Procedure: Limited Echo (Both Spectral and Color Flow Doppler were utilized            during procedure). Indications:    pericardial effusion  History:        Patient has prior history of Echocardiogram examinations, most                 recent 12/09/2023. Arrythmias:RBBB and Atrial Fibrillation,                 Signs/Symptoms:Edema; Risk Factors:Sleep Apnea, Diabetes,                 Hypertension and Dyslipidemia.  Sonographer:    Tinnie Barefoot RDCS Referring Phys: [phone] MICHAEL COOPER IMPRESSIONS  1. Limited Echo to evaluate for pericardial effusion, s/p pericardiocentesis on 12/09/2023.  2. No evidence of pericardial effusion.  3. Left ventricular ejection fraction, by estimation, is 60 to 65%. The left ventricle has normal function. The left ventricle has no regional wall motion abnormalities.  4. Right ventricular systolic function is normal. The right ventricular size is normal. There is normal pulmonary artery systolic pressure.  5. The inferior vena cava is normal in size with greater than 50%  respiratory variability, suggesting right atrial pressure of 3 mmHg. FINDINGS  Left Ventricle: Left ventricular ejection fraction, by estimation, is 60 to 65%. The left ventricle has normal function. The left ventricle has no regional wall motion abnormalities. The left ventricular internal cavity size was normal in size. There is  no left ventricular hypertrophy. Right Ventricle: The right ventricular size is normal. No increase in right ventricular wall thickness. Right ventricular systolic function is normal. There is normal pulmonary artery systolic pressure. The tricuspid regurgitant velocity is 2.50 m/s, and  with an assumed right atrial pressure of 3 mmHg, the estimated right ventricular systolic pressure is 28.0 mmHg. Left Atrium: Left atrial size was normal in size. Right Atrium: Right atrial size was normal in size. Pericardium: There is no evidence of pericardial effusion. Mitral Valve: The mitral valve is normal in structure. No evidence of mitral valve stenosis. Tricuspid Valve: The tricuspid valve is normal in structure. Tricuspid valve regurgitation is trivial. No evidence of tricuspid stenosis. Aortic Valve: The aortic valve has an indeterminant number of cusps. Aortic valve regurgitation is not visualized. No aortic stenosis is present. Pulmonic Valve: The pulmonic valve was normal in structure. Pulmonic valve regurgitation  is not visualized. No evidence of pulmonic stenosis. Aorta: Aortic root could not be assessed. Venous: The inferior vena cava is normal in size with greater than 50% respiratory variability, suggesting right atrial pressure of 3 mmHg. IAS/Shunts: No atrial level shunt detected by color flow Doppler. Additional Comments: Spectral Doppler performed. Color Doppler performed.  LEFT VENTRICLE PLAX 2D LVIDd:         4.20 cm LVIDs:         2.60 cm LV PW:         1.00 cm LV IVS:        1.10 cm  LEFT ATRIUM         Index LA diam:    3.60 cm 1.86 cm/m  AORTIC VALVE LVOT Vmax:   131.00  cm/s LVOT Vmean:  87.067 cm/s LVOT VTI:    0.220 m TRICUSPID VALVE TR Peak grad:   25.0 mmHg TR Vmax:        250.00 cm/s  SHUNTS Systemic VTI: 0.22 m Vishnu Priya Mallipeddi Electronically signed by Diannah Late Mallipeddi Signature Date/Time: 12/10/2023/11:28:08 AM    Final        Elgie Butter M.D. [facility] 12/11/2023, 3:56 PM  Available via Epic secure chat 7am-7pm After 7 pm, please refer to night coverage provider listed on amion.

## 2023-12-12 DIAGNOSIS — E1159 Type 2 diabetes mellitus with other circulatory complications: Secondary | ICD-10-CM | POA: Diagnosis not present

## 2023-12-12 DIAGNOSIS — E039 Hypothyroidism, unspecified: Secondary | ICD-10-CM | POA: Diagnosis not present

## 2023-12-12 DIAGNOSIS — I3139 Other pericardial effusion (noninflammatory): Secondary | ICD-10-CM | POA: Diagnosis not present

## 2023-12-12 DIAGNOSIS — E871 Hypo-osmolality and hyponatremia: Secondary | ICD-10-CM | POA: Diagnosis not present

## 2023-12-12 LAB — BASIC METABOLIC PANEL WITH GFR
Anion gap: 11 (ref 5–15)
BUN: 7 mg/dL — ABNORMAL LOW (ref 8–23)
CO2: 22 mmol/L (ref 22–32)
Calcium: 8.1 mg/dL — ABNORMAL LOW (ref 8.9–10.3)
Chloride: 94 mmol/L — ABNORMAL LOW (ref 98–111)
Creatinine, Ser: 0.61 mg/dL (ref 0.44–1.00)
GFR, Estimated: >60 mL/min (ref >60)
Glucose, Bld: 166 mg/dL — ABNORMAL HIGH (ref 70–99)
Potassium: 3.8 mmol/L (ref 3.5–5.1)
Sodium: 127 mmol/L — ABNORMAL LOW (ref 135–145)

## 2023-12-12 LAB — GLUCOSE, CAPILLARY
Glucose-Capillary: 146 mg/dL — ABNORMAL HIGH (ref 70–99)
Glucose-Capillary: 138 mg/dL — ABNORMAL HIGH (ref 70–99)
Glucose-Capillary: 128 mg/dL — ABNORMAL HIGH (ref 70–99)
Glucose-Capillary: 173 mg/dL — ABNORMAL HIGH (ref 70–99)

## 2023-12-12 LAB — CBC
HCT: 26.9 % — ABNORMAL LOW (ref 36.0–46.0)
Hemoglobin: 9 g/dL — ABNORMAL LOW (ref 12.0–15.0)
MCH: 27.8 pg (ref 26.0–34.0)
MCHC: 33.5 g/dL (ref 30.0–36.0)
MCV: 83 fL (ref 80.0–100.0)
Platelets: 569 K/uL — ABNORMAL HIGH (ref 150–400)
RBC: 3.24 MIL/uL — ABNORMAL LOW (ref 3.87–5.11)
RDW: 14.4 % (ref 11.5–15.5)
WBC: 15.2 K/uL — ABNORMAL HIGH (ref 4.0–10.5)
nRBC: 0 % (ref 0.0–0.2)

## 2023-12-12 LAB — SODIUM: Sodium: 127 mmol/L — ABNORMAL LOW (ref 135–145)

## 2023-12-12 LAB — BODY FLUID CULTURE W GRAM STAIN: Culture: NO GROWTH

## 2023-12-12 MED ORDER — LISINOPRIL 5 MG PO TABS
5.0000 mg | ORAL_TABLET | Freq: Every day | ORAL | Status: DC
  Administered 2023-12-12 – 2023-12-25 (×13): 5 mg via ORAL
  Filled 2023-12-12 (×13): qty 1

## 2023-12-12 MED ORDER — SODIUM CHLORIDE 1 G PO TABS: 1.0000 g | ORAL_TABLET | Freq: Two times a day (BID) | ORAL | Status: DC

## 2023-12-12 MED ORDER — PANTOPRAZOLE SODIUM 40 MG PO TBEC
40.0000 mg | DELAYED_RELEASE_TABLET | Freq: Two times a day (BID) | ORAL | Status: DC
Start: 2023-12-12 — End: 2024-01-02
  Administered 2023-12-12 – 2024-01-02 (×41): 40 mg via ORAL
  Filled 2023-12-12 (×41): qty 1

## 2023-12-12 MED ORDER — DOCUSATE SODIUM 100 MG PO CAPS
100.0000 mg | ORAL_CAPSULE | Freq: Two times a day (BID) | ORAL | Status: DC
  Administered 2023-12-12 – 2024-01-02 (×26): 100 mg via ORAL
  Filled 2023-12-12 (×31): qty 1

## 2023-12-12 MED ORDER — SODIUM CHLORIDE 1 G PO TABS
1.0000 g | ORAL_TABLET | Freq: Two times a day (BID) | ORAL | Status: DC
  Administered 2023-12-12 – 2023-12-23 (×23): 1 g via ORAL
  Filled 2023-12-12 (×23): qty 1

## 2023-12-12 MED ORDER — POLYETHYLENE GLYCOL 3350 17 G PO PACK
17.0000 g | PACK | Freq: Two times a day (BID) | ORAL | Status: DC | PRN
  Administered 2023-12-12 – 2023-12-29 (×3): 17 g via ORAL
  Filled 2023-12-12 (×3): qty 1

## 2023-12-12 NOTE — Consult Note (Signed)
 Advanced Heart Failure Team Consult Note   Primary Physician: [name], MD Cardiologist:  Shelda Bruckner, MD  Reason for Consultation: Pericardial Effusion   HPI:    Brittany Acosta is seen today for evaluation of peridcardial effusion at the request of Dr Thana.   Brittany Acosta is a 73 year old with a history of breast cancer treated with radiation, fibromyalgia, OSA, DM, HLD, and obesity.    Had Echo 09/2023 LVEF 55-60^ mild pericardial effusion.   She went to [facility] for pre op clearance for upcoming shoulder surgery but was sent to the ED due to cough and chest pain. CTA negative PE, mod-large effusion. CRP 23, sed rate 55, resp panel negative, HS Trop  28>29, Pro BNP 725.  Echo-LV EF 55-60%. Moderate-large pericardial effusion. Cardiology consulted. S/P Pericardiocentesis with 700 cc removed. Out slowed over the 72 hours.   Pericardiac fluid- No growth. Cytology pending.   Complaining of chest soreness.   Home Medications Prior to Admission medications   Medication Sig Start Date End Date Taking? Authorizing Provider  allopurinol  (ZYLOPRIM ) 300 MG tablet Take 1 tablet (300 mg total) by mouth daily. 11/22/23  Yes [name], MD  aspirin  EC 81 MG tablet Take 1 tablet (81 mg total) by mouth daily. Swallow whole. 01/25/20  Yes Bruckner Shelda, MD  Biotin  10000 MCG TABS Take 10,000 mcg by mouth at bedtime.   Yes [provider]  BLACK ELDERBERRY,BERRY-FLOWER, PO Take 1 capsule by mouth in the morning.   Yes [provider]  cetirizine (ZYRTEC) 10 MG tablet Take 20 mg by mouth in the morning.   Yes [provider]  Cholecalciferol  (VITAMIN D3) 125 MCG (5000 UT) CAPS Take 5,000 Units by mouth at bedtime.   Yes [provider]  CINNAMON PO Take 2,000 mg by mouth in the morning.   Yes [provider]  cyclobenzaprine  (FLEXERIL ) 10 MG tablet Take 1 tablet (10 mg) by mouth at bedtime as needed for muscle spasms. 11/22/23  Yes  [name], MD  doxepin  (SINEQUAN ) 10 MG capsule Take 1 capsule (10 mg total) by mouth daily. 11/22/23  Yes [name], MD  ferrous sulfate  325 (65 FE) MG tablet Take 325 mg by mouth in the morning.   Yes [provider]  fluticasone  (FLONASE ) 50 MCG/ACT nasal spray Place 2 sprays into both nostrils daily. Patient taking differently: Place 2 sprays into both nostrils daily as needed for allergies. 11/28/23  Yes [name], MD  folic acid  (FOLVITE ) 1 MG tablet Take 2 tablets (2 mg total) by mouth in the morning. 11/22/23  Yes [name], MD  furosemide  (LASIX ) 40 MG tablet Take 1 tablet (40 mg total) by mouth daily. 02/08/23  Yes [name], MD  Homeopathic Products (EARACHE DROPS OT) Place 1-2 drops in ear(s) 3 (three) times daily as needed (ear pain.).   Yes [provider]  insulin  degludec (TRESIBA  FLEXTOUCH) 100 UNIT/ML FlexTouch Pen Inject 26 Units into the skin in the morning. Patient taking differently: Inject 23 Units into the skin in the morning. 11/22/23  Yes [name], MD  lamoTRIgine  (LAMICTAL ) 200 MG tablet TAKE 1 TABLET (200 MG TOTAL) BY MOUTH DAILY AT 12 NOON. 02/08/23  Yes [name], MD  levothyroxine  (SYNTHROID ) 50 MCG tablet Take 1 tablet (50 mcg total) by mouth daily. 12/12/22  Yes [name], MD  lisinopril  (ZESTRIL ) 5 MG tablet Take 1 tablet (5 mg total)  by mouth daily. 02/08/23  Yes [name], MD  magnesium  oxide (MAG-OX) 400 (240 Mg) MG tablet Take 400 mg by mouth in the morning.   Yes [provider]  metFORMIN  (GLUCOPHAGE ) 500 MG tablet Take 2 tablets (1,000 mg total) by mouth every morning AND 1 tablet (500 mg total) every evening. 11/22/23  Yes [name], MD  omeprazole  (PRILOSEC) 20 MG capsule TAKE 1 CAPSULE (20 MG TOTAL) BY MOUTH DAILY. TAKE 30 MINUTES BEFORE A MEAL 02/08/23  Yes [name], MD  ondansetron  (ZOFRAN -ODT) 4 MG disintegrating tablet Take 1 tablet (4 mg total) by mouth every  4 (four) hours as needed for nausea/vomiting 11/18/22  Yes [name], MD  oxybutynin  (DITROPAN -XL) 10 MG 24 hr tablet Take 1 tablet (10 mg total) by mouth at bedtime. 11/22/23  Yes [name], MD  Polyethyl Glycol-Propyl Glycol (SYSTANE ULTRA) 0.4-0.3 % SOLN Place 1-2 drops into both eyes 3 (three) times daily as needed (dry/irritated eyes).   Yes [provider]  potassium chloride  SA (KLOR-CON  M) 20 MEQ tablet Take 1 tablet (20 mEq total) by mouth daily. 11/22/23  Yes [name], MD  pravastatin  (PRAVACHOL ) 20 MG tablet Take 1 tablet (20 mg total) by mouth at bedtime. 11/22/23  Yes [name], MD  sertraline  (ZOLOFT ) 100 MG tablet Take 1 tablet (100 mg total) by mouth at bedtime. 10/30/23  Yes [name], MD  sulfaSALAzine  (AZULFIDINE ) 500 MG tablet Take 2 tablets (1,000 mg total) by mouth 2 (two) times daily. 09/20/23  Yes   tamsulosin  (FLOMAX ) 0.4 MG CAPS capsule Take 1 capsule (0.4 mg total) by mouth at bedtime. 11/22/23  Yes [name], MD  glucose blood (TRUE METRIX BLOOD GLUCOSE TEST) test strip TEST BLOOD SUGAR EVERY DAY AND AS NEEDED 06/11/23   Webb, Padonda B, FNP  Lancets [facility] DELICA PLUS [place]) MISC Use as directed to monitor glucose daily and as needed. 06/14/23   Douglass Kenney NOVAK, FNP    Past Medical History: Past Medical History:  Diagnosis Date   Allergic rhinitis, cause unspecified    Arthritis    Bipolar disorder, unspecified (HCC)    depression   Cancer (HCC)    skin cancer on left leg, left breast cancer   Cataract    Chronic diastolic heart failure (HCC)    Depression    GERD (gastroesophageal reflux disease)    Headache    History of gout    haven't had it in several years (02/08/2012)   History of kidney stones    History of wrist fracture    rt wrist   Hypertension    Iron deficiency anemia    Morbid obesity (HCC)    Neuropathy due to secondary diabetes (HCC)    Nontoxic multinodular goiter    Osteoporosis     Personal history of radiation therapy    Pneumonia    PONV (postoperative nausea and vomiting)    Psoriatic arthritis (HCC)    PTSD (post-traumatic stress disorder)    Pure hypercholesterolemia    RBBB    Scleroderma (HCC)    Sleep apnea 10/2012   mild osa-did not need cpap -dr clance   Type II diabetes mellitus (HCC)    Unspecified hypothyroidism     Past Surgical History: Past Surgical History:  Procedure Laterality Date   ABDOMINAL HYSTERECTOMY  1976   BREAST EXCISIONAL BIOPSY Left    BREAST LUMPECTOMY Left 04/25/2018   BREAST LUMPECTOMY WITH RADIOACTIVE SEED  AND SENTINEL LYMPH NODE BIOPSY Left 04/25/2018   Procedure: LEFT BREAST LUMPECTOMY WITH RADIOACTIVE SEED AND SENTINEL LYMPH NODE BIOPSY;  Surgeon: Mikell Katz, MD;  Location: [facility] OR;  Service: General;  Laterality: Left;   BREAST SURGERY     CARDIAC CATHETERIZATION  2001   sees Dr Peter [place]   CATARACT EXTRACTION  2014   CATARACT EXTRACTION, BILATERAL  02/2011   epps   CHOLECYSTECTOMY  1985   ESOPHAGOGASTRODUODENOSCOPY (EGD) WITH PROPOFOL  N/A 10/14/2022   Procedure: ESOPHAGOGASTRODUODENOSCOPY (EGD) WITH PROPOFOL ;  Surgeon: Wilhelmenia Aloha Raddle., MD;  Location: [facility];  Service: Gastroenterology;  Laterality: N/A;   EUS N/A 10/14/2022   Procedure: UPPER ENDOSCOPIC ULTRASOUND (EUS) RADIAL;  Surgeon: Wilhelmenia Aloha Raddle., MD;  Location: [facility];  Service: Gastroenterology;  Laterality: N/A;   EXCISIONAL HEMORRHOIDECTOMY     dr cut out in his office (02/08/2012)   INCISIONAL BREAST BIOPSY  2000   right   JOINT REPLACEMENT     rt knee   KNEE ARTHROSCOPY  08/2009   right   KNEE ARTHROSCOPY WITH MEDIAL MENISECTOMY     left   Left Cystoscopy   1990   LEFT OOPHORECTOMY  1980   Lithotripsy (L) Kidney  1997   LUMBAR LAMINECTOMY/DECOMPRESSION MICRODISCECTOMY Left 07/01/2016   Procedure: Laminectomy for synovial cyst - left - Lumbar four-five;  Surgeon: Victory Gunnels, MD;  Location: [facility] OR;  Service:  Neurosurgery;  Laterality: Left;   PARTIAL MASTECTOMY WITH NEEDLE LOCALIZATION Left 09/01/2012   Procedure: PARTIAL MASTECTOMY WITH NEEDLE LOCALIZATION;  Surgeon: Elon CHRISTELLA Pacini, MD;  Location: [facility] OR;  Service: General;  Laterality: Left;   Percitania stone removed (L) Kidney  1992   PERICARDIOCENTESIS N/A 12/09/2023   Procedure: PERICARDIOCENTESIS;  Surgeon: Wonda Sharper, MD;  Location: [facility] INVASIVE CV LAB;  Service: Cardiovascular;  Laterality: N/A;   REVERSE SHOULDER ARTHROPLASTY Right 02/12/2021   Procedure: REVERSE SHOULDER ARTHROPLASTY;  Surgeon: Dozier Soulier, MD;  Location: [facility];  Service: Orthopedics;  Laterality: Right;   Right nasal surgery  08/1988   Right sinus removed  08/1989   tooth partial   ROTATOR CUFF REPAIR  2013   right shoulder x 2   SHOULDER ARTHROSCOPY WITH ROTATOR CUFF REPAIR AND SUBACROMIAL DECOMPRESSION Left 08/20/2013   Procedure: SHOULDER ARTHROSCOPY  AND SUBACROMIAL DECOMPRESSION;  Surgeon: Soulier Elsie Dozier, MD;  Location: [facility];  Service: Orthopedics;  Laterality: Left;  Left shoulder arthroscopy, debridement, subacromial decompression, distal clavical resection   THYROIDECTOMY  04/22/2011   Procedure: THYROIDECTOMY;  Surgeon: Vaughan JONETTA Ricker, MD;  Location: [facility] OR;  Service: ENT;  Laterality: N/A;  TOTAL THYROIDECOTMY   TOTAL KNEE ARTHROPLASTY  06/18/2011   Procedure: TOTAL KNEE ARTHROPLASTY;  Surgeon: Dempsey JINNY Sensor, MD;  Location: [facility] OR;  Service: Orthopedics;  Laterality: Left;  DEPUY   TOTAL KNEE ARTHROPLASTY  02/07/2012   Procedure: TOTAL KNEE ARTHROPLASTY;  Surgeon: Dempsey JINNY Sensor, MD;  Location: [facility] OR;  Service: Orthopedics;  Laterality: Right;   TUBAL LIGATION  1972    Family History: Family History  Problem Relation Age of Onset   Hypertension Mother    Lung cancer Mother        non-smoker   Diabetes Father    Hypertension Father    Hyperlipidemia Father    Hypertension Sister    Hypertension Sister     Hypertension Sister    Hypertension Brother    Hypertension Brother    Hypertension Brother    Stomach cancer Maternal Grandmother  Diabetes Daughter    Heart attack Other    Coronary artery disease Other    Colon cancer Neg Hx        unsure   Esophageal cancer Neg Hx    Liver cancer Neg Hx    Pancreatic cancer Neg Hx    Rectal cancer Neg Hx    Colon polyps Neg Hx     Social History: Social History   Socioeconomic History   Marital status: Married    Spouse name: Not on file   Number of children: 2   Years of education: 14   Highest education level: Not on file  Occupational History   Occupation: disability    Employer: RETIRED  Tobacco Use   Smoking status: Former    Current packs/day: 0.00    Average packs/day: 1 pack/day for 6.0 years (6.0 ttl pk-yrs)    Types: Cigarettes    Start date: 04/05/1977    Quit date: 04/06/1983    Years since quitting: 40.7   Smokeless tobacco: Never   Tobacco comments:    Married, lives with spouse. Disable- 2 grown kids-6 g-kids  Vaping Use   Vaping status: Never Used  Substance and Sexual Activity   Alcohol use: No    Comment: none   Drug use: No   Sexual activity: Not Currently  Other Topics Concern   Not on file  Social History Narrative   Married, lives with spouse - 2 adult children   Disabled -- started around 2000 for mental health reasons, Bipolar Disorder/PTSD   Husband works   Social Drivers of Corporate investment banker Strain: Low Risk  (09/05/2019)   Received from [facility]   Overall Financial Resource Strain (CARDIA)    Difficulty of Paying Living Expenses: Not very hard  Food Insecurity: No Food Insecurity (12/08/2023)   Hunger Vital Sign    Worried About Running Out of Food in the Last Year: Never true    Ran Out of Food in the Last Year: Never true  Transportation Needs: No Transportation Needs (12/08/2023)   PRAPARE - Administrator, Civil Service (Medical): No    Lack of Transportation  (Non-Medical): No  Physical Activity: Unknown (09/05/2019)   Received from [facility]   Exercise Vital Sign    On average, how many days per week do you engage in moderate to strenuous exercise (like a brisk walk)?: 0 days    Minutes of Exercise per Session: Not on file  Stress: No Stress Concern Present (09/05/2019)   Received from [facility] - Occupational Stress Questionnaire    Feeling of Stress : Only a little  Social Connections: Moderately Isolated (12/08/2023)   Social Connection and Isolation Panel    Frequency of Communication with Friends and Family: More than three times a week    Frequency of Social Gatherings with Friends and Family: Once a week    Attends Religious Services: Never    Database administrator or Organizations: No    Attends Engineer, structural: Never    Marital Status: Married    Allergies:  No Known Allergies  Objective:    Vital Signs:   Temp:  [97.4 F (36.3 C)-98.7 F (37.1 C)] 98.6 F (37 C) (09/08 0649) Pulse Rate:  [81-103] 85 (09/08 0815) Resp:  [17-30] 24 (09/08 0815) BP: (107-157)/(58-119) 144/86 (09/08 0800) SpO2:  [88 %-98 %] 96 % (09/08 0800) Last BM Date : 12/07/23  Weight change: Filed Weights   12/08/23 1543 12/08/23 2303  Weight: 97.1 kg 94.8 kg    Intake/Output:   Intake/Output Summary (Last 24 hours) at 12/12/2023 1000 Last data filed at 12/12/2023 0800 Gross per 24 hour  Intake --  Output 2825 ml  Net -2825 ml      Physical Exam    General:   No resp difficulty. Appears weak.  Neck: no JVD.  Cor: Regular rate & rhythm.  Lungs: clear Abdomen: soft, nontender, nondistended.  Extremities: no  edema Neuro: alert & oriented x3  Telemetry   SR  EKG    SR RBBB  Labs   Basic Metabolic Panel: Recent Labs  Lab 12/08/23 1544 12/09/23 0431 12/10/23 0312 12/11/23 0327 12/12/23 0333  NA 129* 129* 130* 129* 127*  K 3.7 3.3* 4.0 4.1 3.8  CL 90* 95* 97*  96* 94*  CO2 24 24 23 22 22   GLUCOSE 169* 125* 178* 158* 166*  BUN 11 9 9 9  7*  CREATININE 0.71 0.71 0.74 0.88 0.61  CALCIUM  9.5 8.3* 8.1* 8.6* 8.1*  MG  --   --  1.7  --   --     Liver Function Tests: Recent Labs  Lab 12/08/23 1700 12/09/23 0431  AST 19 14*  ALT 12 11  ALKPHOS 105 78  BILITOT 0.4 0.5  PROT 7.1 6.1*  ALBUMIN  3.8 2.8*   No results for input(s): LIPASE, AMYLASE in the last 168 hours. No results for input(s): AMMONIA in the last 168 hours.  CBC: Recent Labs  Lab 12/08/23 1544 12/09/23 0431 12/10/23 0312 12/11/23 0327 12/12/23 0333  WBC 14.1* 14.7* 16.8* 15.0* 15.2*  NEUTROABS  --   --  12.1* 10.9*  --   HGB 10.5* 9.8* 9.8* 9.8* 9.0*  HCT 31.9* 28.9* 29.9* 28.9* 26.9*  MCV 83.7 81.9 85.2 83.5 83.0  PLT 466* 473* 458* 483* 569*    Cardiac Enzymes: No results for input(s): CKTOTAL, CKMB, CKMBINDEX, TROPONINI in the last 168 hours.  BNP: BNP (last 3 results) No results for input(s): BNP in the last 8760 hours.  ProBNP (last 3 results) Recent Labs    12/08/23 1700  PROBNP 725.0*     CBG: Recent Labs  Lab 12/11/23 0614 12/11/23 1123 12/11/23 1607 12/11/23 2055 12/12/23 0646  GLUCAP 159* 236* 108* 223* 146*    Coagulation Studies: No results for input(s): LABPROT, INR in the last 72 hours.   Imaging   DG CHEST PORT 1 VIEW Result Date: 12/11/2023 CLINICAL DATA:  Cough, chest pain. EXAM: PORTABLE CHEST 1 VIEW COMPARISON:  December 08, 2023. FINDINGS: Stable cardiomegaly. Lungs are clear. Status post right shoulder arthroplasty. IMPRESSION: No active disease. Electronically Signed   By: Lynwood Landy Raddle M.D.   On: 12/11/2023 17:20     Medications:     Current Medications:  allopurinol   300 mg Oral Daily   Chlorhexidine  Gluconate Cloth  6 each Topical Daily   colchicine   0.6 mg Oral BID   vitamin B-12  500 mcg Oral Daily   doxepin   10 mg Oral Daily   ferrous sulfate   325 mg Oral Q breakfast   ibuprofen   600 mg  Oral TID   insulin  aspart  0-15 Units Subcutaneous TID WC   lamoTRIgine   200 mg Oral Q1200   levothyroxine   50 mcg Oral Daily   oxybutynin   10 mg Oral QHS   pantoprazole   40 mg Oral Daily   pravastatin   20 mg Oral QHS  sertraline   100 mg Oral QHS   sodium chloride  flush  3 mL Intravenous Q12H    Infusions:     Patient Profile  Brittany Carneiro is a 73 year old with a history of breast cancer treated with radiation, fibromyalgia, OSA, DM, HLD, and obesity.  Admitted pericardial effusion.   Assessment/Plan   Pericardial Effusion Echo LV ~ 55% mod-large effusion. Inflammatory markers elevated.  9/5 S/P Pericardiacentesis. Output slowing possible removal later today. Continue Ibuprofen  600 mg three times a day + colchicine . 0.6 mg twice a day.  Continue PPI.  Pericardial fluid- No growth  . Cytology pending.   Hyponatremia ? Possible related to Sertraline /Lamtripgin.  Per primary team   Hypothyroid -TSH 6.8  -On Levothyroxine .   Length of Stay: 4  Zylie Mumaw, NP  12/12/2023, 10:00 AM    Advanced Heart Failure Team Pager [phone] (M-F; 7a - 5p)  Please contact [facility] for night-coverage after hours (4p -7a ) and weekends on [email or web address]

## 2023-12-12 NOTE — Progress Notes (Signed)
 [facility] night cross cover note:   I was notified by the patient's RN  of pt's constipation, with most recent bowel movement reported to have occurred greater than 72 hours ago.  I subsequently added scheduled Colace as well as as needed MiraLAX .      Eva Pore, DO Hospitalist

## 2023-12-12 NOTE — Evaluation (Signed)
 Physical Therapy Evaluation Patient Details Name: Brittany Acosta MRN: [id number] DOB: September 04, 1950 Today's Date: 12/12/2023  History of Present Illness  Patient is a 73 y/o female admitted 12/08/23 due to SOB and CP.  Found to have moderate to large pericardial effusion, underwent pericardiocentesis with drain placement 12/09/23.  PMH positive for DM, HTN, psoriatic arthropathy, fibromyalgia, h/o gout, h/o breast CA, HLD.  Clinical Impression  Patient presents with decreased mobility due to generalized weakness, decreased activity tolerance and she will benefit from skilled PT in the acute setting and from rollator for home.  No PT follow up necessary at d/c.  Noted VSS on RA throughout.         If plan is discharge home, recommend the following: A little help with walking and/or transfers;Assist for transportation;A little help with bathing/dressing/bathroom   Can travel by private vehicle        Equipment Recommendations Rollator (4 wheels)  Recommendations for Other Services       Functional Status Assessment Patient has had a recent decline in their functional status and demonstrates the ability to make significant improvements in function in a reasonable and predictable amount of time.     Precautions / Restrictions Precautions Precautions: Fall Precaution/Restrictions Comments: L side accordian drain      Mobility  Bed Mobility Overal bed mobility: Needs Assistance Bed Mobility: Supine to Sit, Sit to Supine     Supine to sit: Contact guard Sit to supine: Supervision   General bed mobility comments: assist for lines/stability    Transfers Overall transfer level: Needs assistance Equipment used: 1 person hand held assist, Rolling walker (2 wheels) Transfers: Sit to/from Stand Sit to Stand: Contact guard assist, Supervision           General transfer comment: initially without device with CGA for safety; added walker after toileting in bathroom     Ambulation/Gait Ambulation/Gait assistance: Contact guard assist Gait Distance (Feet): 150 Feet Assistive device: Rolling walker (2 wheels) Gait Pattern/deviations: Step-through pattern, Decreased stride length       General Gait Details: pace appropriate for lines and situation, using walker with cues for direction and CGA for safety, on RA throughout SpO2 97%, HR stable.  Spouse in recliner in room though pt relates already up in chair today  Stairs            Wheelchair Mobility     Tilt Bed    Modified Rankin (Stroke Patients Only)       Balance Overall balance assessment: Needs assistance   Sitting balance-Leahy Scale: Good Sitting balance - Comments: performed toilet hygiene on toilet unaided   Standing balance support: No upper extremity supported Standing balance-Leahy Scale: Fair Standing balance comment: washing hands at sink no UE support                             Pertinent Vitals/Pain Pain Assessment Pain Assessment: No/denies pain    Home Living Family/patient expects to be discharged to:: Private residence Living Arrangements: Spouse/significant other Available Help at Discharge: Family Type of Home: House Home Access: Stairs to enter Entrance Stairs-Rails: None Entrance Stairs-Number of Steps: 2   Home Layout: One level Home Equipment: None      Prior Function Prior Level of Function : Independent/Modified Independent             Mobility Comments: feels weak at times, was planning to get a walker ADLs Comments: was scheduled for L  total shoulder and has diffculty with cooking due to shoulder pain, has houskeeper once every two weeks, was doing PT for shoulder but it's been awhile     Extremity/Trunk Assessment   Upper Extremity Assessment Upper Extremity Assessment: RUE deficits/detail RUE Deficits / Details: reports R shoulder pain with function/elevation, not formally tested    Lower Extremity  Assessment Lower Extremity Assessment: Overall WFL for tasks assessed    Cervical / Trunk Assessment Cervical / Trunk Assessment: Normal  Communication        Cognition Arousal: Alert Behavior During Therapy: WFL for tasks assessed/performed   PT - Cognitive impairments: No apparent impairments                         Following commands: Intact       Cueing       General Comments General comments (skin integrity, edema, etc.): toileted and completed toilet hygiene and hand washing with S to CGA    Exercises     Assessment/Plan    PT Assessment    PT Problem List         PT Treatment Interventions      PT Goals (Current goals can be found in the Care Plan section)  Acute Rehab PT Goals Patient Stated Goal: return home have walker for community PT Goal Formulation: With patient/family Time For Goal Achievement: 12/26/23 Potential to Achieve Goals: Good    Frequency       Co-evaluation               AM-PAC PT 6 Clicks Mobility  Outcome Measure Help needed turning from your back to your side while in a flat bed without using bedrails?: A Little Help needed moving from lying on your back to sitting on the side of a flat bed without using bedrails?: A Little Help needed moving to and from a bed to a chair (including a wheelchair)?: A Little Help needed standing up from a chair using your arms (e.g., wheelchair or bedside chair)?: A Little Help needed to walk in hospital room?: A Little Help needed climbing 3-5 steps with a railing? : A Little 6 Click Score: 18    End of Session   Activity Tolerance: Patient tolerated treatment well Patient left: in bed;with call bell/phone within reach   PT Visit Diagnosis: Other abnormalities of gait and mobility (R26.89)    Time: 1012-1040 PT Time Calculation (min) (ACUTE ONLY): 28 min   Charges:   PT Evaluation $PT Eval Low Complexity: 1 Low PT Treatments $Gait Training: 8-22 mins PT General  Charges $$ ACUTE PT VISIT: 1 Visit         Micheline Portal, PT Acute Rehabilitation Services Office:[phone] 12/12/2023   Montie Portal 12/12/2023, 12:07 PM

## 2023-12-12 NOTE — Progress Notes (Signed)
 [facility]                                                                               [name], is a 73 y.o. female, DOB - May 05, 1950, FMW:[id number] Admit date - 12/08/2023    Outpatient Primary MD for the patient is [name], MD  LOS - 4  days    Brief summary   Brittany Acosta is a 73 y.o. female with a past medical history of diabetes mellitus type 2 on insulin , essential hypertension, Psoriatec arthropathy, history of gout, history of breast cancer, hyperlipidemia who was in her usual state of health till about 2 to 3 days ago when she started developing shortness of breath along with chest pain.  CT scan chest  which raise concern for moderate to large pericardial effusion. Case was discussed with cardiology who recommended hospitalization for further evaluation.  She was taken for OR for pericardiocentesis.  Assessment & Plan    Assessment and Plan:   Pericardial effusion:  Unclear etiology, differential include uremic etiology, vs viral infection.  Echocardiogram showed moderate effusion, with JVD and symptomatic with sob and intermittent chest pain.  Cardiology on board , following , underwent pericardiocentesis with 9/5 with out. Pericardial fluid sent for analysis, it was bloody in appearance. Gram stain is negative and cultures are pending.  Cytology was sent and pending.  She was empirically started on colchicine  0.6 mg BID along with ibuprofen  600 mg TID.  Repeat echo shows resolution of the pericardial effusion, drain removed by cardiology.     Hypokalemia Replaced.   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  Pt reports poor oral intake, lack of appetite due to sob and chest pain and weight loss.  She appears euvolemic,  Sodium just hovering at 127 to 130.  TSH slightly high, free t4 elevated. Urine sodium is less than 30, and urine osmolality is 187. Serum osmo is 271 Am cortisol is 8.8  Suspect from medications ,  she remains asymptomatic.  Stopped zoloft , unable to find a reason why she is on lamictal .  She has been on it for many years.  Start salt tablets. Check sodium later tonight and if still low will call nephrology in am.     Hypothyroidism:  Resume synthroid .   Type 2 DM with hyperglycemia:  CBG (last 3)  Recent Labs    12/11/23 2055 12/12/23 0646 12/12/23 1157  GLUCAP 223* 146* 138*   Resume SSI , hemoglobin A1c is 5.6%/    Hyperlipidemia Resume pravastatin  20 mg daily.    Bipolar disorder Resume Zoloft    Worsening leukocytosis of unclear etiology She remains afebrile. Procalcitonin is negative Respiratory panel is negative. CTA chest is negative for pneumonia.  Pericardial fluid sent for analysis and gram stain is negative, cultures are pending.  UA shows mod leukocytes, with rare bacteria.  CXR is negative.  as she continues to have dry cough. Respiratory panel is negative.  Add protonix .    Normocytic anemia Baseline hemoglobin appears to be around 10.  Hemoglobin around 9.8 today.   Vitamin b12 deficiency/ Iron deficiency anemia Replacements ordered.     Estimated body mass index  is 38.85 kg/m as calculated from the following:   Height as of this encounter: 5' 1.5 (1.562 m).   Weight as of this encounter: 94.8 kg.  Code Status: full code.  DVT Prophylaxis:  SCD's Start: 12/09/23 1559 SCDs Start: 12/08/23 2335   Level of Care: Level of care: ICU Family Communication: none at bedside.   Disposition Plan:     Remains inpatient appropriate:  pending clinical improvement.   Procedures:  Pericardiocentesis.   Consultants:   Cardiology.  Antimicrobials:   Anti-infectives (From admission, onward)    None        Medications  Scheduled Meds:  allopurinol   300 mg Oral Daily   Chlorhexidine  Gluconate Cloth  6 each Topical Daily   colchicine   0.6 mg Oral BID   vitamin B-12  500 mcg Oral Daily   doxepin   10 mg Oral Daily   ferrous sulfate    325 mg Oral Q breakfast   ibuprofen   600 mg Oral TID   insulin  aspart  0-15 Units Subcutaneous TID WC   lamoTRIgine   200 mg Oral Q1200   levothyroxine   50 mcg Oral Daily   lisinopril   5 mg Oral Daily   oxybutynin   10 mg Oral QHS   pantoprazole   40 mg Oral Daily   pravastatin   20 mg Oral QHS   sodium chloride  flush  3 mL Intravenous Q12H   sodium chloride   1 g Oral BID WC   Continuous Infusions:   PRN Meds:.acetaminophen  **OR** acetaminophen , benzonatate , cyclobenzaprine , guaiFENesin -dextromethorphan , morphine  injection, ondansetron  **OR** ondansetron  (ZOFRAN ) IV, sodium chloride  flush    Subjective:   Brittany Acosta was seen and examined today.  Still coughing. No chest pain no pedal edema.  Objective:   Vitals:   12/12/23 0800 12/12/23 0815 12/12/23 1158 12/12/23 1211  BP: (!) 144/86   (!) 161/66  Pulse: 88 85    Resp: (!) 21 (!) 24    Temp:   98.1 F (36.7 C)   TempSrc:   Oral   SpO2: 96%     Weight:      Height:        Intake/Output Summary (Last 24 hours) at 12/12/2023 1426 Last data filed at 12/12/2023 1200 Gross per 24 hour  Intake --  Output 2830 ml  Net -2830 ml   Filed Weights   12/08/23 1543 12/08/23 2303  Weight: 97.1 kg 94.8 kg     Exam General exam: Appears calm and comfortable  Respiratory system: Clear to auscultation. Respiratory effort normal. Cardiovascular system: S1 & S2 heard, RRR. No JVD,  Gastrointestinal system: Abdomen is nondistended, soft and nontender. Central nervous system: Alert and oriented. No focal neurological deficits. Extremities: Symmetric 5 x 5 power. Skin: No rashes, Psychiatry:  Mood & affect appropriate.       Data Reviewed:  I have personally reviewed following labs and imaging studies   CBC Lab Results  Component Value Date   WBC 15.2 (H) 12/12/2023   RBC 3.24 (L) 12/12/2023   HGB 9.0 (L) 12/12/2023   HCT 26.9 (L) 12/12/2023   MCV 83.0 12/12/2023   MCH 27.8 12/12/2023   PLT 569 (H) 12/12/2023   MCHC  33.5 12/12/2023   RDW 14.4 12/12/2023   LYMPHSABS 2.0 12/11/2023   MONOABS 1.4 (H) 12/11/2023   EOSABS 0.7 (H) 12/11/2023   BASOSABS 0.1 12/11/2023     Last metabolic panel Lab Results  Component Value Date   NA 127 (L) 12/12/2023   K 3.8 12/12/2023   CL  94 (L) 12/12/2023   CO2 22 12/12/2023   BUN 7 (L) 12/12/2023   CREATININE 0.61 12/12/2023   GLUCOSE 166 (H) 12/12/2023   GFRNONAA >60 12/12/2023   GFRAA >60 09/14/2019   CALCIUM  8.1 (L) 12/12/2023   PROT 6.1 (L) 12/09/2023   ALBUMIN  2.8 (L) 12/09/2023   BILITOT 0.5 12/09/2023   ALKPHOS 78 12/09/2023   AST 14 (L) 12/09/2023   ALT 11 12/09/2023   ANIONGAP 11 12/12/2023    CBG (last 3)  Recent Labs    12/11/23 2055 12/12/23 0646 12/12/23 1157  GLUCAP 223* 146* 138*      Coagulation Profile: No results for input(s): INR, PROTIME in the last 168 hours.   Radiology Studies: DG CHEST PORT 1 VIEW Result Date: 12/11/2023 CLINICAL DATA:  Cough, chest pain. EXAM: PORTABLE CHEST 1 VIEW COMPARISON:  December 08, 2023. FINDINGS: Stable cardiomegaly. Lungs are clear. Status post right shoulder arthroplasty. IMPRESSION: No active disease. Electronically Signed   By: Lynwood Landy Raddle M.D.   On: 12/11/2023 17:20       Elgie Butter M.D. [facility] 12/12/2023, 2:26 PM  Available via Epic secure chat 7am-7pm After 7 pm, please refer to night coverage provider listed on amion.

## 2023-12-12 NOTE — Progress Notes (Signed)
   Heart failure team will sign off as of 12/12/23 Pericardial drain removed.   Follow up as an outpatient in the HF clinic ?   No  If no please follow up with [facility]  after discharge.   Back to Heart Care tomorrow.   OK to transfer out of the ICU  Johari Bennetts NP-C  1:59 PM

## 2023-12-13 ENCOUNTER — Inpatient Hospital Stay (HOSPITAL_COMMUNITY)

## 2023-12-13 DIAGNOSIS — E871 Hypo-osmolality and hyponatremia: Secondary | ICD-10-CM | POA: Diagnosis not present

## 2023-12-13 DIAGNOSIS — E1159 Type 2 diabetes mellitus with other circulatory complications: Secondary | ICD-10-CM | POA: Diagnosis not present

## 2023-12-13 DIAGNOSIS — I3139 Other pericardial effusion (noninflammatory): Secondary | ICD-10-CM | POA: Diagnosis not present

## 2023-12-13 DIAGNOSIS — E039 Hypothyroidism, unspecified: Secondary | ICD-10-CM | POA: Diagnosis not present

## 2023-12-13 LAB — BASIC METABOLIC PANEL WITH GFR
Anion gap: 11 (ref 5–15)
BUN: 9 mg/dL (ref 8–23)
CO2: 22 mmol/L (ref 22–32)
Calcium: 8.1 mg/dL — ABNORMAL LOW (ref 8.9–10.3)
Chloride: 92 mmol/L — ABNORMAL LOW (ref 98–111)
Creatinine, Ser: 0.84 mg/dL (ref 0.44–1.00)
GFR, Estimated: >60 mL/min (ref >60)
Glucose, Bld: 246 mg/dL — ABNORMAL HIGH (ref 70–99)
Potassium: 4 mmol/L (ref 3.5–5.1)
Sodium: 125 mmol/L — ABNORMAL LOW (ref 135–145)

## 2023-12-13 LAB — CBC WITH DIFFERENTIAL/PLATELET
Abs Immature Granulocytes: 0.16 K/uL — ABNORMAL HIGH (ref 0.00–0.07)
Basophils Absolute: 0 K/uL (ref 0.0–0.1)
Basophils Relative: 0 %
Eosinophils Absolute: 0.7 K/uL — ABNORMAL HIGH (ref 0.0–0.5)
Eosinophils Relative: 5 %
HCT: 26 % — ABNORMAL LOW (ref 36.0–46.0)
Hemoglobin: 8.8 g/dL — ABNORMAL LOW (ref 12.0–15.0)
Immature Granulocytes: 1 %
Lymphocytes Relative: 13 %
Lymphs Abs: 1.8 K/uL (ref 0.7–4.0)
MCH: 28 pg (ref 26.0–34.0)
MCHC: 33.8 g/dL (ref 30.0–36.0)
MCV: 82.8 fL (ref 80.0–100.0)
Monocytes Absolute: 1.4 K/uL — ABNORMAL HIGH (ref 0.1–1.0)
Monocytes Relative: 10 %
Neutro Abs: 10.3 K/uL — ABNORMAL HIGH (ref 1.7–7.7)
Neutrophils Relative %: 71 %
Platelets: 617 K/uL — ABNORMAL HIGH (ref 150–400)
RBC: 3.14 MIL/uL — ABNORMAL LOW (ref 3.87–5.11)
RDW: 14.5 % (ref 11.5–15.5)
WBC: 14.5 K/uL — ABNORMAL HIGH (ref 4.0–10.5)
nRBC: 0 % (ref 0.0–0.2)

## 2023-12-13 LAB — GLUCOSE, CAPILLARY
Glucose-Capillary: 278 mg/dL — ABNORMAL HIGH (ref 70–99)
Glucose-Capillary: 127 mg/dL — ABNORMAL HIGH (ref 70–99)
Glucose-Capillary: 104 mg/dL — ABNORMAL HIGH (ref 70–99)

## 2023-12-13 LAB — ECHOCARDIOGRAM LIMITED
Height: 61.5 in
Weight: 3435.65 [oz_av]

## 2023-12-13 LAB — CYTOLOGY - NON PAP

## 2023-12-13 LAB — SODIUM: Sodium: 131 mmol/L — ABNORMAL LOW (ref 135–145)

## 2023-12-13 MED ORDER — FUROSEMIDE 10 MG/ML IJ SOLN
20.0000 mg | Freq: Once | INTRAMUSCULAR | Status: AC
Start: 2023-12-14 — End: 2023-12-13
  Administered 2023-12-13: 20 mg via INTRAVENOUS
  Filled 2023-12-13: qty 2

## 2023-12-13 MED ORDER — ORAL CARE MOUTH RINSE: 15.0000 mL | OROMUCOSAL | Status: DC | PRN

## 2023-12-13 MED ORDER — CYCLOBENZAPRINE HCL 5 MG PO TABS
7.5000 mg | ORAL_TABLET | Freq: Three times a day (TID) | ORAL | Status: DC | PRN
  Administered 2023-12-13 – 2024-01-02 (×18): 7.5 mg via ORAL
  Filled 2023-12-13 (×24): qty 1.5

## 2023-12-13 MED ORDER — ALBUTEROL SULFATE (2.5 MG/3ML) 0.083% IN NEBU
2.5000 mg | INHALATION_SOLUTION | RESPIRATORY_TRACT | Status: DC | PRN
  Administered 2023-12-13 – 2023-12-24 (×8): 2.5 mg via RESPIRATORY_TRACT
  Filled 2023-12-13 (×8): qty 3

## 2023-12-13 MED ORDER — BENZONATATE 100 MG PO CAPS
200.0000 mg | ORAL_CAPSULE | Freq: Two times a day (BID) | ORAL | Status: DC
  Administered 2023-12-13 – 2023-12-25 (×25): 200 mg via ORAL
  Filled 2023-12-13 (×25): qty 2

## 2023-12-13 MED ORDER — HYDROCODONE BIT-HOMATROP MBR 5-1.5 MG/5ML PO SOLN
5.0000 mL | Freq: Four times a day (QID) | ORAL | Status: DC | PRN
  Administered 2023-12-13 – 2023-12-21 (×14): 5 mL via ORAL
  Filled 2023-12-13 (×15): qty 5

## 2023-12-13 MED ORDER — INSULIN GLARGINE 100 UNIT/ML ~~LOC~~ SOLN
15.0000 [IU] | SUBCUTANEOUS | Status: DC
  Administered 2023-12-13 – 2023-12-18 (×6): 15 [IU] via SUBCUTANEOUS
  Filled 2023-12-13 (×8): qty 0.15

## 2023-12-13 MED ORDER — SODIUM CHLORIDE 0.9 % IV SOLN
INTRAVENOUS | Status: AC
Start: 2023-12-13 — End: 2023-12-13

## 2023-12-13 MED ORDER — FUROSEMIDE 10 MG/ML IJ SOLN: 20.0000 mg | Freq: Once | INTRAMUSCULAR | Status: DC

## 2023-12-13 NOTE — Progress Notes (Signed)
 Pt was assisted to the bathroom by the Nurse Tech. Nurse tech reported to RN that the patient hit the back of their head when sitting on the toilet. Patient's neuro assessment was unchanged from baseline. Patient's head was examine, no signs of bleeding, bruising or swelling. Patient reported no headache, blurred vision or pain from the strike to the back of the head. Patient was educated about bending forward (taking a bow stance) to sit down. Patient was educated about the importance of having a staff member present and ready to help before the patient attempts to stand or sit, and the issues that could arise if the patient was non compliant with these interventions. Patient stated her understanding and agreed she would call anytime she needed to get up or sit down.   12/11/2024 2300 Rock Plan. RN

## 2023-12-13 NOTE — Progress Notes (Signed)
 Advanced Heart Failure Rounding Note  Cardiologist: Shelda Bruckner, MD  Chief Complaint: Cough Subjective:   9/8- Pericaridal drain removed.   Complaining of cough.    Objective:   Weight Range: 97.4 kg Body mass index is 39.92 kg/m.   Vital Signs:   Temp:  [97.5 F (36.4 C)-98.5 F (36.9 C)] 97.5 F (36.4 C) (09/09 0700) Pulse Rate:  [86-104] 93 (09/09 0700) Resp:  [14-33] 23 (09/09 0700) BP: (129-163)/(46-120) 154/88 (09/09 0700) SpO2:  [80 %-100 %] 98 % (09/09 0700) Weight:  [97.4 kg] 97.4 kg (09/09 0600) Last BM Date : 12/07/23  Weight change: Filed Weights   12/08/23 1543 12/08/23 2303 12/13/23 0600  Weight: 97.1 kg 94.8 kg 97.4 kg    Intake/Output:   Intake/Output Summary (Last 24 hours) at 12/13/2023 0818 Last data filed at 12/13/2023 0600 Gross per 24 hour  Intake 1193 ml  Output 4105 ml  Net -2912 ml      Physical Exam  General:   In bed. No resp difficulty Neck: no JVD.  Cor: Regular rate & rhythm.  Lungs: clear Abdomen: soft, nontender, nondistended.  Extremities: no  edema Neuro: alert & oriented x3   Telemetry   SR   EKG   N/A   Labs    CBC Recent Labs    12/11/23 0327 12/12/23 0333 12/13/23 0142  WBC 15.0* 15.2* 14.5*  NEUTROABS 10.9*  --  10.3*  HGB 9.8* 9.0* 8.8*  HCT 28.9* 26.9* 26.0*  MCV 83.5 83.0 82.8  PLT 483* 569* 617*   Basic Metabolic Panel Recent Labs    90/91/74 0333 12/12/23 2041 12/13/23 0142  NA 127* 127* 125*  K 3.8  --  4.0  CL 94*  --  92*  CO2 22  --  22  GLUCOSE 166*  --  246*  BUN 7*  --  9  CREATININE 0.61  --  0.84  CALCIUM  8.1*  --  8.1*   Liver Function Tests No results for input(s): AST, ALT, ALKPHOS, BILITOT, PROT, ALBUMIN  in the last 72 hours. No results for input(s): LIPASE, AMYLASE in the last 72 hours. Cardiac Enzymes No results for input(s): CKTOTAL, CKMB, CKMBINDEX, TROPONINI in the last 72 hours.  BNP: BNP (last 3 results) No results for  input(s): BNP in the last 8760 hours.  ProBNP (last 3 results) Recent Labs    12/08/23 1700  PROBNP 725.0*     D-Dimer No results for input(s): DDIMER in the last 72 hours. Hemoglobin A1C No results for input(s): HGBA1C in the last 72 hours. Fasting Lipid Panel No results for input(s): CHOL, HDL, LDLCALC, TRIG, CHOLHDL, LDLDIRECT in the last 72 hours. Thyroid  Function Tests No results for input(s): TSH, T4TOTAL, T3FREE, THYROIDAB in the last 72 hours.  Invalid input(s): FREET3  Other results:   Imaging    No results found.   Medications:     Scheduled Medications:  allopurinol   300 mg Oral Daily   Chlorhexidine  Gluconate Cloth  6 each Topical Daily   colchicine   0.6 mg Oral BID   vitamin B-12  500 mcg Oral Daily   docusate sodium   100 mg Oral BID   doxepin   10 mg Oral Daily   ferrous sulfate   325 mg Oral Q breakfast   ibuprofen   600 mg Oral TID   insulin  aspart  0-15 Units Subcutaneous TID WC   lamoTRIgine   200 mg Oral Q1200   levothyroxine   50 mcg Oral Daily   lisinopril   5  mg Oral Daily   oxybutynin   10 mg Oral QHS   pantoprazole   40 mg Oral BID   pravastatin   20 mg Oral QHS   sodium chloride  flush  3 mL Intravenous Q12H   sodium chloride   1 g Oral BID WC    Infusions:   PRN Medications: acetaminophen  **OR** acetaminophen , benzonatate , cyclobenzaprine , guaiFENesin -dextromethorphan , morphine  injection, ondansetron  **OR** ondansetron  (ZOFRAN ) IV, mouth rinse, polyethylene glycol, sodium chloride  flush    Patient Profile   Mrs Saad is a 73 year old with a history of breast cancer treated with radiation, fibromyalgia, OSA, DM, HLD, and obesity.   Admitted with pericardial effusion.   Assessment/Plan     Pericardial Effusion Echo LV ~ 55% mod-large effusion. Inflammatory markers elevated.  9/5 S/P Pericardiacentesis.  Continue Ibuprofen  600 mg three times a day + colchicine . 0.6 mg twice a day.  Continue PPI.   Pericardial fluid- No growth  . Cytology pending. Leigonella pending.  Pericardial drain removed 9/8. Will repeat ECHO today. --> EF remains stable. Minimal pericardial effusion per Dr Samule.    Hyponatremia ? Possible related to Sertraline /Lamtripgin.  Per primary team    Hypothyroid -TSH 6.8  -On Levothyroxine .   OOB mobilize. Ok to move out ot ICU.   Length of Stay: 5  Tasheem Elms, NP  12/13/2023, 8:18 AM  Advanced Heart Failure Team Pager [phone] (M-F; 7a - 5p)  Please contact [facility] for night-coverage after hours (5p -7a ) and weekends on [email or web address]

## 2023-12-13 NOTE — TOC Progression Note (Signed)
 Transition of Care [facility]) - Progression Note    Patient Details  Name: Brittany Acosta MRN: [id number] Date of Birth: [birth year]  Transition of Care [facility] CM/SW Contact  Justina Delcia Czar, RN Phone Number: [phone] 12/13/2023, 5:23 PM  Clinical Narrative:    Spoke to pt and husband at bedside. Pt states she had [facility] in the past for [facility]. States she does need [employer] for home. Contacted [facility] for [employer].   No HH is identified. Will continue to follow for dc needs.    Expected Discharge Plan: Home/Self Care Barriers to Discharge: Continued Medical Work up    Expected Discharge Plan and Services In-house Referral: NA Discharge Planning Services: CM Consult Post Acute Care Choice: Home Health Living arrangements for the past 2 months: Single Family Home                 DME Arranged: [facility] with seat DME Agency: [facility] Date DME Agency Contacted: 12/13/23 Time DME Agency Contacted: [phone] Representative spoke with at DME Agency: [facility] HH Arranged: NA           Social Drivers of Health (SDOH) Interventions SDOH Screenings   Food Insecurity: No Food Insecurity (12/08/2023)  Housing: Low Risk  (12/08/2023)  Transportation Needs: No Transportation Needs (12/08/2023)  Utilities: Not At Risk (12/08/2023)  Depression (PHQ2-9): Low Risk  (11/22/2023)  Financial Resource Strain: Low Risk  (09/05/2019)   Received from [facility]  Physical Activity: Unknown (09/05/2019)   Received from [facility]  Social Connections: Moderately Isolated (12/08/2023)  Stress: No Stress Concern Present (09/05/2019)   Received from [facility]  Tobacco Use: Medium Risk (12/09/2023)    Readmission Risk Interventions     No data to display

## 2023-12-13 NOTE — Progress Notes (Signed)
 [facility]                                                                               Estephanie Hubbs, is a 73 y.o. female, DOB - [birth year], FMW:[id number] Admit date - 12/08/2023    Outpatient Primary MD for the patient is [name], MD  LOS - 5  days    Brief summary   Brittany Acosta is a 73 y.o. female with a past medical history of diabetes mellitus type 2 on insulin , essential hypertension, Psoriatec arthropathy, history of gout, history of breast cancer, hyperlipidemia who was in her usual state of health till about 2 to 3 days ago when she started developing shortness of breath along with chest pain.  CT scan chest  which raise concern for moderate to large pericardial effusion. Case was discussed with cardiology who recommended hospitalization for further evaluation.  She was taken for OR for pericardiocentesis.  Assessment & Plan    Assessment and Plan:   Pericardial effusion:  Unclear etiology, differential include uremic etiology, vs viral infection.  ESR is 55, CRP is 23 Echocardiogram showed moderate effusion, with JVD and symptomatic with sob and intermittent chest pain.  Cardiology on board ,, underwent pericardiocentesis with 9/5 with out. Pericardial fluid sent for analysis, it was bloody in appearance. Gram stain is negative and cultures are pending.  Cytology was sent in view of her  history of breast cancer and pending.  She was empirically started on colchicine  0.6 mg BID along with ibuprofen  600 mg TID.  Repeat echo shows resolution of the pericardial effusion, drain removed by cardiology on 9/8.     Hypokalemia Replaced.   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  Pt reports poor oral intake, lack of appetite due to sob and chest pain and weight loss.  She appears euvolemic, serum sodium slowly worsening from 130 to 125.   TSH slightly high, free t4 elevated. Urine sodium is less than 30, and urine osmolality is 187.  Serum osmo is 271. Am cortisol is 8.8  Suspect from medications , she remains asymptomatic.  Stopped zoloft , unable to find a reason why she is on lamictal .  She has been on it for many years.  Since she underwent pericardiocentesis and almost 900 ml fluid drained, will give a fluid trial to see if sodium improves.  Repeat sodium this evening and monitor.    Hypothyroidism:  Resume synthroid . Abnormal thyroid  panel  with slightly elevated TSH and elevated free t4.  Recheck thyroid  panel in 4 weeks.    Type 2 DM with hyperglycemia:  CBG (last 3)  Recent Labs    12/13/23 0749 12/13/23 1132 12/13/23 1632  GLUCAP 278* 127* 104*   Resume SSI , hemoglobin A1c is 5.6%/    Hyperlipidemia Resume pravastatin  20 mg daily.    Bipolar disorder Resume Zoloft    Worsening leukocytosis of unclear etiology She remains afebrile. Procalcitonin is negative Respiratory panel is negative. CTA chest is negative for pneumonia.  Pericardial fluid sent for analysis and gram stain is negative, cultures are pending.  UA shows mod leukocytes, with rare bacteria.  CXR is negative.  as she continues  to have dry cough. Respiratory panel is negative.  Add protonix .    Normocytic anemia Baseline hemoglobin appears to be around 10.  Hemoglobin around 9.8 today.   Vitamin b12 deficiency/ Iron deficiency anemia Replacements ordered.    Persistent cough  Unclear etiology, ? Pericardial effusion,? Pericarditis? Pleuritis CXR is negative.  Increased protonix  to BID as she was started on scheduled Ibuprofen  and colchicine  for pericardial effusion.  ? From colchicine  Tried tessalon  and mucinex , without any relief.  Will start her on hycodan to see if she improves.  Monitor.    Estimated body mass index is 39.92 kg/m as calculated from the following:   Height as of this encounter: 5' 1.5 (1.562 m).   Weight as of this encounter: 97.4 kg.  Code Status: full code.  DVT Prophylaxis:  SCD's  Start: 12/09/23 1559 SCDs Start: 12/08/23 2335   Level of Care: Level of care: Telemetry Medical Family Communication: none at bedside.   Disposition Plan:     Remains inpatient appropriate:  pending clinical improvement.   Procedures:  Pericardiocentesis.   Consultants:   Cardiology.  Antimicrobials:   Anti-infectives (From admission, onward)    None        Medications  Scheduled Meds:  allopurinol   300 mg Oral Daily   benzonatate   200 mg Oral BID   Chlorhexidine  Gluconate Cloth  6 each Topical Daily   colchicine   0.6 mg Oral BID   vitamin B-12  500 mcg Oral Daily   docusate sodium   100 mg Oral BID   doxepin   10 mg Oral Daily   ferrous sulfate   325 mg Oral Q breakfast   ibuprofen   600 mg Oral TID   insulin  aspart  0-15 Units Subcutaneous TID WC   insulin  glargine  15 Units Subcutaneous Q24H   lamoTRIgine   200 mg Oral Q1200   levothyroxine   50 mcg Oral Daily   lisinopril   5 mg Oral Daily   oxybutynin   10 mg Oral QHS   pantoprazole   40 mg Oral BID   pravastatin   20 mg Oral QHS   sodium chloride  flush  3 mL Intravenous Q12H   sodium chloride   1 g Oral BID WC   Continuous Infusions:  sodium chloride  100 mL/hr at 12/13/23 1700    PRN Meds:.acetaminophen  **OR** acetaminophen , cyclobenzaprine , HYDROcodone  bit-homatropine, morphine  injection, ondansetron  **OR** ondansetron  (ZOFRAN ) IV, mouth rinse, polyethylene glycol, sodium chloride  flush    Subjective:   Brittany Acosta was seen and examined today.  Persistent cough for three days now.  Objective:   Vitals:   12/13/23 1400 12/13/23 1500 12/13/23 1600 12/13/23 1700  BP:   135/70   Pulse: 89 90 84 89  Resp: 20 (!) 34 (!) 22 (!) 28  Temp:      TempSrc:      SpO2: 97% 97% 96% 99%  Weight:      Height:        Intake/Output Summary (Last 24 hours) at 12/13/2023 1745 Last data filed at 12/13/2023 1700 Gross per 24 hour  Intake 1663.99 ml  Output 3000 ml  Net -1336.01 ml   Filed Weights   12/08/23 1543  12/08/23 2303 12/13/23 0600  Weight: 97.1 kg 94.8 kg 97.4 kg     Exam General exam: Appears calm and comfortable  Respiratory system: Clear to auscultation. Respiratory effort normal. Cardiovascular system: S1 & S2 heard, RRR. No JVD,  Gastrointestinal system: Abdomen is nondistended, soft and nontender.  Central nervous system: Alert and oriented. No focal neurological  deficits. Extremities: Symmetric 5 x 5 power. Skin: No rashes,  Psychiatry:  Mood & affect appropriate.        Data Reviewed:  I have personally reviewed following labs and imaging studies   CBC Lab Results  Component Value Date   WBC 14.5 (H) 12/13/2023   RBC 3.14 (L) 12/13/2023   HGB 8.8 (L) 12/13/2023   HCT 26.0 (L) 12/13/2023   MCV 82.8 12/13/2023   MCH 28.0 12/13/2023   PLT 617 (H) 12/13/2023   MCHC 33.8 12/13/2023   RDW 14.5 12/13/2023   LYMPHSABS 1.8 12/13/2023   MONOABS 1.4 (H) 12/13/2023   EOSABS 0.7 (H) 12/13/2023   BASOSABS 0.0 12/13/2023     Last metabolic panel Lab Results  Component Value Date   NA 131 (L) 12/13/2023   K 4.0 12/13/2023   CL 92 (L) 12/13/2023   CO2 22 12/13/2023   BUN 9 12/13/2023   CREATININE 0.84 12/13/2023   GLUCOSE 246 (H) 12/13/2023   GFRNONAA >60 12/13/2023   GFRAA >60 09/14/2019   CALCIUM  8.1 (L) 12/13/2023   PROT 6.1 (L) 12/09/2023   ALBUMIN  2.8 (L) 12/09/2023   BILITOT 0.5 12/09/2023   ALKPHOS 78 12/09/2023   AST 14 (L) 12/09/2023   ALT 11 12/09/2023   ANIONGAP 11 12/13/2023    CBG (last 3)  Recent Labs    12/13/23 0749 12/13/23 1132 12/13/23 1632  GLUCAP 278* 127* 104*      Coagulation Profile: No results for input(s): INR, PROTIME in the last 168 hours.   Radiology Studies: ECHOCARDIOGRAM LIMITED Result Date: 12/13/2023    ECHOCARDIOGRAM LIMITED REPORT   Patient Name:   Brittany Acosta Date of Exam: 12/13/2023 Medical Rec #:  [id number]     Height:       61.5 in Accession #:    [id number]    Weight:       214.7 lb Date of Birth:   04/15/1950      BSA:          1.958 m Patient Age:    73 years      BP:           148/98 mmHg Patient Gender: F             HR:           95 bpm. Exam Location:  Inpatient Procedure: Limited Echo (Both Spectral and Color Flow Doppler were utilized            during procedure). Indications:    Pericardial effusion I31.3  History:        Patient has prior history of Echocardiogram examinations, most                 recent 12/10/2023. Pericardial effusion S/P peracentsis, Limited                 echo to check for effusion.  Sonographer:    BERNARDA ROCKS Referring Phys: [id number] AMY D CLEGG IMPRESSIONS  1. Left ventricular ejection fraction, by estimation, is 65 to 70%. The left ventricle has normal function. The left ventricle has no regional wall motion abnormalities.  2. Right ventricular systolic function is normal. The right ventricular size is normal.  3. The mitral valve is normal in structure.  4. The aortic valve is normal in structure.  5. The inferior vena cava is normal in size with greater than 50% respiratory variability, suggesting right atrial pressure of 3 mmHg. Comparison(s): No significant change from  prior study. Prior images reviewed side by side. There is no evidence of recurrent pericardial effusion. FINDINGS  Left Ventricle: Left ventricular ejection fraction, by estimation, is 65 to 70%. The left ventricle has normal function. The left ventricle has no regional wall motion abnormalities. Right Ventricle: The right ventricular size is normal. Right ventricular systolic function is normal. Pericardium: There is no evidence of pericardial effusion. Presence of epicardial fat layer. Mitral Valve: The mitral valve is normal in structure. Tricuspid Valve: The tricuspid valve is normal in structure. Aortic Valve: The aortic valve is normal in structure. Pulmonic Valve: The pulmonic valve was not well visualized. Venous: The inferior vena cava is normal in size with greater than 50% respiratory variability,  suggesting right atrial pressure of 3 mmHg. Jerel Balding MD Electronically signed by Jerel Balding MD Signature Date/Time: 12/13/2023/1:34:20 PM    Final        Elgie Butter M.D. [facility] 12/13/2023, 5:45 PM  Available via Epic secure chat 7am-7pm After 7 pm, please refer to night coverage provider listed on amion.

## 2023-12-13 NOTE — Inpatient Diabetes Management (Addendum)
 Inpatient Diabetes Program Recommendations  AACE/ADA: New Consensus Statement on Inpatient Glycemic Control (2015)  Target Ranges:  Prepandial:   less than 140 mg/dL      Peak postprandial:   less than 180 mg/dL (1-2 hours)      Critically ill patients:  140 - 180 mg/dL   Lab Results  Component Value Date   GLUCAP 278 (H) 12/13/2023   HGBA1C 5.6 12/09/2023    Review of Glycemic Control  Latest Reference Range & Units 12/12/23 06:46 12/12/23 11:57 12/12/23 16:37 12/12/23 21:15 12/13/23 07:49  Glucose-Capillary 70 - 99 mg/dL 853 (H) 861 (H) 871 (H) 173 (H) 278 (H)   Diabetes history: DM 2 Outpatient Diabetes medications:  Metformin  1000 mg q AM and 500 mg q PM Tresiba  23 units q AM Current orders for Inpatient glycemic control:  Novolog  0-15 units tid with meals   Inpatient Diabetes Program Recommendations:    Note CBG's trending up.  Consider adding a portion of basal insulin  (takes Tresiba  23 units daily at home).  Consider adding Lantus  16 units daily while in the hospital.   Thanks,  Randall Bullocks, RN, BC-ADM Inpatient Diabetes Coordinator Pager [phone]  (8a-5p)

## 2023-12-13 NOTE — Consult Note (Addendum)
 [name] Nephrology Consultation Note  Requesting MD: Dr. Cherlyn, Elgie Reason for consult: Hyponatremia  HPI:  Brittany Acosta is a 73 y.o. female with past medical history significant for type 2 diabetes, hypertension, hypothyroidism, psoriatic arthropathy, history of gout, breast cancer, dyslipidemia who was presented with shortness of breath and chest pain seen as a consultation for the evaluation of hyponatremia. The patient was found to have moderate to large pericardial effusion status post pericardiocentesis on 9/5 and currently on treatment with NSAIDs and colchicine .  Echocardiogram with EF of 55% and had a moderate large effusion.  Inflammatory markers are elevated.  The pericardial drain was removed. The patient has had history of mild hyponatremia in the past.  Presented with serum sodium level of 129 which was progressively worsen to 125 today.  TSH was mildly elevated receiving Synthroid .  Urine osmolality was 187 and urine sodium level less than 30.  Serum osmolarity was 271.  She was restarted on normal saline this afternoon.  Also on salt tablet.  Patient stated her breathing is much better after pericardial fluid removal however still having some chest discomfort and overall not feeling well.  She was accompanied by her husband at the bedside.  The Zoloft  was discontinued because of worsening hyponatremia.  PMHx:   Past Medical History:  Diagnosis Date   Allergic rhinitis, cause unspecified    Arthritis    Bipolar disorder, unspecified (HCC)    depression   Cancer (HCC)    skin cancer on left leg, left breast cancer   Cataract    Chronic diastolic heart failure (HCC)    Depression    GERD (gastroesophageal reflux disease)    Headache    History of gout    haven't had it in several years (02/08/2012)   History of kidney stones    History of wrist fracture    rt wrist   Hypertension    Iron deficiency anemia    Morbid obesity (HCC)    Neuropathy due  to secondary diabetes (HCC)    Nontoxic multinodular goiter    Osteoporosis    Personal history of radiation therapy    Pneumonia    PONV (postoperative nausea and vomiting)    Psoriatic arthritis (HCC)    PTSD (post-traumatic stress disorder)    Pure hypercholesterolemia    RBBB    Scleroderma (HCC)    Sleep apnea 10/2012   mild osa-did not need cpap -dr clance   Type II diabetes mellitus (HCC)    Unspecified hypothyroidism     Past Surgical History:  Procedure Laterality Date   ABDOMINAL HYSTERECTOMY  1976   BREAST EXCISIONAL BIOPSY Left    BREAST LUMPECTOMY Left 04/25/2018   BREAST LUMPECTOMY WITH RADIOACTIVE SEED AND SENTINEL LYMPH NODE BIOPSY Left 04/25/2018   Procedure: LEFT BREAST LUMPECTOMY WITH RADIOACTIVE SEED AND SENTINEL LYMPH NODE BIOPSY;  Surgeon: Mikell Katz, MD;  Location: [facility] OR;  Service: General;  Laterality: Left;   BREAST SURGERY     CARDIAC CATHETERIZATION  2001   sees Dr Peter [place]   CATARACT EXTRACTION  2014   CATARACT EXTRACTION, BILATERAL  02/2011   epps   CHOLECYSTECTOMY  1985   ESOPHAGOGASTRODUODENOSCOPY (EGD) WITH PROPOFOL  N/A 10/14/2022   Procedure: ESOPHAGOGASTRODUODENOSCOPY (EGD) WITH PROPOFOL ;  Surgeon: Wilhelmenia Aloha Raddle., MD;  Location: [facility];  Service: Gastroenterology;  Laterality: N/A;   EUS N/A 10/14/2022   Procedure: UPPER ENDOSCOPIC ULTRASOUND (EUS) RADIAL;  Surgeon: Wilhelmenia Aloha Raddle., MD;  Location: [facility];  Service: Gastroenterology;  Laterality: N/A;   EXCISIONAL HEMORRHOIDECTOMY     dr cut out in his office (02/08/2012)   INCISIONAL BREAST BIOPSY  2000   right   JOINT REPLACEMENT     rt knee   KNEE ARTHROSCOPY  08/2009   right   KNEE ARTHROSCOPY WITH MEDIAL MENISECTOMY     left   Left Cystoscopy   1990   LEFT OOPHORECTOMY  1980   Lithotripsy (L) Kidney  1997   LUMBAR LAMINECTOMY/DECOMPRESSION MICRODISCECTOMY Left 07/01/2016   Procedure: Laminectomy for synovial cyst - left - Lumbar four-five;   Surgeon: Victory Gunnels, MD;  Location: [facility] OR;  Service: Neurosurgery;  Laterality: Left;   PARTIAL MASTECTOMY WITH NEEDLE LOCALIZATION Left 09/01/2012   Procedure: PARTIAL MASTECTOMY WITH NEEDLE LOCALIZATION;  Surgeon: Elon CHRISTELLA Pacini, MD;  Location: [facility] OR;  Service: General;  Laterality: Left;   Percitania stone removed (L) Kidney  1992   PERICARDIOCENTESIS N/A 12/09/2023   Procedure: PERICARDIOCENTESIS;  Surgeon: Wonda Sharper, MD;  Location: [facility] INVASIVE CV LAB;  Service: Cardiovascular;  Laterality: N/A;   REVERSE SHOULDER ARTHROPLASTY Right 02/12/2021   Procedure: REVERSE SHOULDER ARTHROPLASTY;  Surgeon: Dozier Soulier, MD;  Location: [facility];  Service: Orthopedics;  Laterality: Right;   Right nasal surgery  08/1988   Right sinus removed  08/1989   tooth partial   ROTATOR CUFF REPAIR  2013   right shoulder x 2   SHOULDER ARTHROSCOPY WITH ROTATOR CUFF REPAIR AND SUBACROMIAL DECOMPRESSION Left 08/20/2013   Procedure: SHOULDER ARTHROSCOPY  AND SUBACROMIAL DECOMPRESSION;  Surgeon: Soulier Elsie Dozier, MD;  Location: [facility];  Service: Orthopedics;  Laterality: Left;  Left shoulder arthroscopy, debridement, subacromial decompression, distal clavical resection   THYROIDECTOMY  04/22/2011   Procedure: THYROIDECTOMY;  Surgeon: Vaughan JONETTA Ricker, MD;  Location: [facility] OR;  Service: ENT;  Laterality: N/A;  TOTAL THYROIDECOTMY   TOTAL KNEE ARTHROPLASTY  06/18/2011   Procedure: TOTAL KNEE ARTHROPLASTY;  Surgeon: Dempsey JINNY Sensor, MD;  Location: [facility] OR;  Service: Orthopedics;  Laterality: Left;  DEPUY   TOTAL KNEE ARTHROPLASTY  02/07/2012   Procedure: TOTAL KNEE ARTHROPLASTY;  Surgeon: Dempsey JINNY Sensor, MD;  Location: [facility] OR;  Service: Orthopedics;  Laterality: Right;   TUBAL LIGATION  1972    Family Hx:  Family History  Problem Relation Age of Onset   Hypertension Mother    Lung cancer Mother        non-smoker   Diabetes Father    Hypertension Father    Hyperlipidemia Father     Hypertension Sister    Hypertension Sister    Hypertension Sister    Hypertension Brother    Hypertension Brother    Hypertension Brother    Stomach cancer Maternal Grandmother    Diabetes Daughter    Heart attack Other    Coronary artery disease Other    Colon cancer Neg Hx        unsure   Esophageal cancer Neg Hx    Liver cancer Neg Hx    Pancreatic cancer Neg Hx    Rectal cancer Neg Hx    Colon polyps Neg Hx     Social History:  reports that she quit smoking about 40 years ago. Her smoking use included cigarettes. She started smoking about 46 years ago. She has a 6 pack-year smoking history. She has never used smokeless tobacco. She reports that she does not drink alcohol and does not use drugs.  Allergies: No Known Allergies  Medications: Prior  to Admission medications   Medication Sig Start Date End Date Taking? Authorizing Provider  allopurinol  (ZYLOPRIM ) 300 MG tablet Take 1 tablet (300 mg total) by mouth daily. 11/22/23  Yes [name], MD  aspirin  EC 81 MG tablet Take 1 tablet (81 mg total) by mouth daily. Swallow whole. 01/25/20  Yes Lonni Slain, MD  Biotin  89999 MCG TABS Take 10,000 mcg by mouth at bedtime.   Yes [provider]  BLACK ELDERBERRY,BERRY-FLOWER, PO Take 1 capsule by mouth in the morning.   Yes [provider]  cetirizine (ZYRTEC) 10 MG tablet Take 20 mg by mouth in the morning.   Yes [provider]  Cholecalciferol  (VITAMIN D3) 125 MCG (5000 UT) CAPS Take 5,000 Units by mouth at bedtime.   Yes [provider]  CINNAMON PO Take 2,000 mg by mouth in the morning.   Yes [provider]  cyclobenzaprine  (FLEXERIL ) 10 MG tablet Take 1 tablet (10 mg) by mouth at bedtime as needed for muscle spasms. 11/22/23  Yes [name], MD  doxepin  (SINEQUAN ) 10 MG capsule Take 1 capsule (10 mg total) by mouth daily. 11/22/23  Yes [name], MD  ferrous sulfate  325 (65 FE) MG tablet Take 325 mg by mouth  in the morning.   Yes [provider]  fluticasone  (FLONASE ) 50 MCG/ACT nasal spray Place 2 sprays into both nostrils daily. Patient taking differently: Place 2 sprays into both nostrils daily as needed for allergies. 11/28/23  Yes [name], MD  folic acid  (FOLVITE ) 1 MG tablet Take 2 tablets (2 mg total) by mouth in the morning. 11/22/23  Yes [name], MD  furosemide  (LASIX ) 40 MG tablet Take 1 tablet (40 mg total) by mouth daily. 02/08/23  Yes [name], MD  Homeopathic Products (EARACHE DROPS OT) Place 1-2 drops in ear(s) 3 (three) times daily as needed (ear pain.).   Yes [provider]  insulin  degludec (TRESIBA  FLEXTOUCH) 100 UNIT/ML FlexTouch Pen Inject 26 Units into the skin in the morning. Patient taking differently: Inject 23 Units into the skin in the morning. 11/22/23  Yes [name], MD  lamoTRIgine  (LAMICTAL ) 200 MG tablet TAKE 1 TABLET (200 MG TOTAL) BY MOUTH DAILY AT 12 NOON. 02/08/23  Yes [name], MD  levothyroxine  (SYNTHROID ) 50 MCG tablet Take 1 tablet (50 mcg total) by mouth daily. 12/12/22  Yes [name], MD  lisinopril  (ZESTRIL ) 5 MG tablet Take 1 tablet (5 mg total) by mouth daily. 02/08/23  Yes [name], MD  magnesium  oxide (MAG-OX) 400 (240 Mg) MG tablet Take 400 mg by mouth in the morning.   Yes [provider]  metFORMIN  (GLUCOPHAGE ) 500 MG tablet Take 2 tablets (1,000 mg total) by mouth every morning AND 1 tablet (500 mg total) every evening. 11/22/23  Yes [name], MD  omeprazole  (PRILOSEC) 20 MG capsule TAKE 1 CAPSULE (20 MG TOTAL) BY MOUTH DAILY. TAKE 30 MINUTES BEFORE A MEAL 02/08/23  Yes [name], MD  ondansetron  (ZOFRAN -ODT) 4 MG disintegrating tablet Take 1 tablet (4 mg total) by mouth every 4 (four) hours as needed for nausea/vomiting 11/18/22  Yes [name], MD  oxybutynin  (DITROPAN -XL) 10 MG 24 hr tablet Take 1 tablet (10 mg total) by mouth at bedtime. 11/22/23  Yes [name], MD  Polyethyl Glycol-Propyl Glycol (SYSTANE ULTRA) 0.4-0.3 % SOLN Place 1-2 drops into both eyes 3 (three) times daily as needed (dry/irritated eyes).  Yes [provider]  potassium chloride  SA (KLOR-CON  M) 20 MEQ tablet Take 1 tablet (20 mEq total) by mouth daily. 11/22/23  Yes [name], MD  pravastatin  (PRAVACHOL ) 20 MG tablet Take 1 tablet (20 mg total) by mouth at bedtime. 11/22/23  Yes [name], MD  sertraline  (ZOLOFT ) 100 MG tablet Take 1 tablet (100 mg total) by mouth at bedtime. 10/30/23  Yes [name], MD  sulfaSALAzine  (AZULFIDINE ) 500 MG tablet Take 2 tablets (1,000 mg total) by mouth 2 (two) times daily. 09/20/23  Yes   tamsulosin  (FLOMAX ) 0.4 MG CAPS capsule Take 1 capsule (0.4 mg total) by mouth at bedtime. 11/22/23  Yes [name], MD  glucose blood (TRUE METRIX BLOOD GLUCOSE TEST) test strip TEST BLOOD SUGAR EVERY DAY AND AS NEEDED 06/11/23   Webb, Padonda B, FNP  Lancets [facility] DELICA PLUS [place]) MISC Use as directed to monitor glucose daily and as needed. 06/14/23   Webb, Padonda B, FNP    I have reviewed the patient's current medications.  Labs: Renal Panel: Recent Labs  Lab 12/09/23 0431 12/10/23 0312 12/11/23 0327 12/12/23 0333 12/12/23 2041 12/13/23 0142  NA 129* 130* 129* 127* 127* 125*  K 3.3* 4.0 4.1 3.8  --  4.0  CL 95* 97* 96* 94*  --  92*  CO2 24 23 22 22   --  22  GLUCOSE 125* 178* 158* 166*  --  246*  BUN 9 9 9  7*  --  9  CREATININE 0.71 0.74 0.88 0.61  --  0.84  CALCIUM  8.3* 8.1* 8.6* 8.1*  --  8.1*  MG  --  1.7  --   --   --   --      CBC:    Latest Ref Rng & Units 12/13/2023    1:42 AM 12/12/2023    3:33 AM 12/11/2023    3:27 AM  CBC  WBC 4.0 - 10.5 K/uL 14.5  15.2  15.0   Hemoglobin 12.0 - 15.0 g/dL 8.8  9.0  9.8   Hematocrit 36.0 - 46.0 % 26.0  26.9  28.9   Platelets 150 - 400 K/uL 617  569  483      Anemia Panel:  Recent Labs    11/22/23 1641 12/08/23 1544 12/09/23 0431 12/10/23 0312  12/11/23 0327 12/12/23 0333 12/13/23 0142  HGB 11.0*   < > 9.8* 9.8* 9.8* 9.0* 8.8*  MCV 84.4   < > 81.9 85.2 83.5 83.0 82.8  VITAMINB12 273  --  185  --   --   --   --   FOLATE  --   --  >20.0  --   --   --   --   FERRITIN 30.4  --  115  --   --   --   --   TIBC 296.8  --  231*  --   --   --   --   IRON 79  --  <10*  --   --   --   --   RETICCTPCT  --   --  1.2  --   --   --   --    < > = values in this interval not displayed.    Recent Labs  Lab 12/08/23 1700 12/09/23 0431  AST 19 14*  ALT 12 11  ALKPHOS 105 78  BILITOT 0.4 0.5  PROT 7.1 6.1*  ALBUMIN  3.8 2.8*    Lab Results  Component Value  Date   HGBA1C 5.6 12/09/2023    ROS:  Pertinent items noted in HPI and remainder of comprehensive ROS otherwise negative.  Physical Exam: Vitals:   12/13/23 1230 12/13/23 1300  BP:  (!) 127/53  Pulse:  91  Resp: (!) 22 (!) 23  Temp:    SpO2:  97%     General exam: Appears calm and comfortable  Respiratory system: Clear to auscultation. Respiratory effort normal. No wheezing or crackle Cardiovascular system: S1 & S2 heard, RRR.  No pedal edema. Gastrointestinal system: Abdomen is nondistended, soft and nontender. Normal bowel sounds heard. Central nervous system: Alert and oriented. No focal neurological deficits. Extremities: Symmetric 5 x 5 power. Skin: No rashes, lesions or ulcers Psychiatry: Judgement and insight appear normal. Mood & affect appropriate.   Assessment/Plan:  # Subacute hyponatremia likely due to combination of SIADH related with Zoloft /pain, hypothyroidism and may have some component of volume depletion. - Currently on normal saline and salt tablet,  this will be diagnostic as well.  Repeating lab.  Agree with discontinuing Zoloft .  Patient is clinically asymptomatic and has no neurological changes therefore no need for hypertonic saline. Repeating urine osmolality and urine lites.  # Hypokalemia: Potassium level improved.  # Pericardial effusion,  etiology unclear at this time.  Status post pericardiocentesis on 7/5.  Currently on colchicine  and NSAIDs per primary team and cardiology.  # Hypothyroidism: TSH elevated, on Synthroid .  Per primary team.  Thank you for the consult, we will continue to follow.   Marysa Wessner Amelie Romney 12/13/2023, 2:10 PM  [employer].

## 2023-12-13 NOTE — Progress Notes (Addendum)
 [facility] night cross cover note:   Patient has just arrived on 2W via transfer from [facility]. Per patient's RN, noted to have some wheezing on exam. No longer on ivf's. VS notable for the following: AF, HR in 90's, SBP in 150's mmHg; RR 18-22; O2 sat 97% on RA. Will add order for prn alb neb's. Lasix  recently held. I've also ordered updated bnp, cxr.     Eva Pore, DO Hospitalist

## 2023-12-14 DIAGNOSIS — Z9889 Other specified postprocedural states: Secondary | ICD-10-CM

## 2023-12-14 DIAGNOSIS — I4892 Unspecified atrial flutter: Secondary | ICD-10-CM

## 2023-12-14 DIAGNOSIS — I300 Acute nonspecific idiopathic pericarditis: Secondary | ICD-10-CM | POA: Diagnosis not present

## 2023-12-14 DIAGNOSIS — I3139 Other pericardial effusion (noninflammatory): Secondary | ICD-10-CM | POA: Diagnosis not present

## 2023-12-14 LAB — RENAL FUNCTION PANEL
Albumin: 2.2 g/dL — ABNORMAL LOW (ref 3.5–5.0)
Anion gap: 12 (ref 5–15)
BUN: 10 mg/dL (ref 8–23)
CO2: 22 mmol/L (ref 22–32)
Calcium: 8.2 mg/dL — ABNORMAL LOW (ref 8.9–10.3)
Chloride: 96 mmol/L — ABNORMAL LOW (ref 98–111)
Creatinine, Ser: 0.7 mg/dL (ref 0.44–1.00)
GFR, Estimated: >60 mL/min (ref >60)
Glucose, Bld: 156 mg/dL — ABNORMAL HIGH (ref 70–99)
Phosphorus: 4 mg/dL (ref 2.5–4.6)
Potassium: 3.5 mmol/L (ref 3.5–5.1)
Sodium: 130 mmol/L — ABNORMAL LOW (ref 135–145)

## 2023-12-14 LAB — CBC WITH DIFFERENTIAL/PLATELET
Abs Immature Granulocytes: 0.18 K/uL — ABNORMAL HIGH (ref 0.00–0.07)
Basophils Absolute: 0.1 K/uL (ref 0.0–0.1)
Basophils Relative: 0 %
Eosinophils Absolute: 0.8 K/uL — ABNORMAL HIGH (ref 0.0–0.5)
Eosinophils Relative: 6 %
HCT: 24.5 % — ABNORMAL LOW (ref 36.0–46.0)
Hemoglobin: 8.1 g/dL — ABNORMAL LOW (ref 12.0–15.0)
Immature Granulocytes: 2 %
Lymphocytes Relative: 15 %
Lymphs Abs: 1.9 K/uL (ref 0.7–4.0)
MCH: 27.5 pg (ref 26.0–34.0)
MCHC: 33.1 g/dL (ref 30.0–36.0)
MCV: 83.1 fL (ref 80.0–100.0)
Monocytes Absolute: 1.5 K/uL — ABNORMAL HIGH (ref 0.1–1.0)
Monocytes Relative: 12 %
Neutro Abs: 8 K/uL — ABNORMAL HIGH (ref 1.7–7.7)
Neutrophils Relative %: 65 %
Platelets: 592 K/uL — ABNORMAL HIGH (ref 150–400)
RBC: 2.95 MIL/uL — ABNORMAL LOW (ref 3.87–5.11)
RDW: 14.6 % (ref 11.5–15.5)
WBC: 12.4 K/uL — ABNORMAL HIGH (ref 4.0–10.5)
nRBC: 0 % (ref 0.0–0.2)

## 2023-12-14 LAB — LEGIONELLA PNEUMOPHILA SEROGP 1 UR AG: L. pneumophila Serogp 1 Ur Ag: NEGATIVE

## 2023-12-14 LAB — GLUCOSE, CAPILLARY
Glucose-Capillary: 156 mg/dL — ABNORMAL HIGH (ref 70–99)
Glucose-Capillary: 112 mg/dL — ABNORMAL HIGH (ref 70–99)
Glucose-Capillary: 132 mg/dL — ABNORMAL HIGH (ref 70–99)
Glucose-Capillary: 141 mg/dL — ABNORMAL HIGH (ref 70–99)

## 2023-12-14 LAB — SODIUM, URINE, RANDOM: Sodium, Ur: 35 mmol/L

## 2023-12-14 LAB — MAGNESIUM: Magnesium: 1.5 mg/dL — ABNORMAL LOW (ref 1.7–2.4)

## 2023-12-14 LAB — OSMOLALITY, URINE: Osmolality, Ur: 130 mosm/kg — ABNORMAL LOW (ref 300–900)

## 2023-12-14 LAB — BRAIN NATRIURETIC PEPTIDE: B Natriuretic Peptide: 244.9 pg/mL — ABNORMAL HIGH (ref 0.0–100.0)

## 2023-12-14 LAB — PROCALCITONIN: Procalcitonin: <0.1 ng/mL

## 2023-12-14 MED ORDER — AMIODARONE HCL IN DEXTROSE 360-4.14 MG/200ML-% IV SOLN
30.0000 mg/h | INTRAVENOUS | Status: DC
  Administered 2023-12-14 – 2023-12-16 (×5): 30 mg/h via INTRAVENOUS
  Filled 2023-12-14 (×5): qty 200

## 2023-12-14 MED ORDER — POTASSIUM CHLORIDE CRYS ER 20 MEQ PO TBCR
40.0000 meq | EXTENDED_RELEASE_TABLET | Freq: Once | ORAL | Status: AC
  Administered 2023-12-14: 40 meq via ORAL
  Filled 2023-12-14: qty 2

## 2023-12-14 MED ORDER — AMIODARONE HCL IN DEXTROSE 360-4.14 MG/200ML-% IV SOLN
60.0000 mg/h | INTRAVENOUS | Status: DC
Start: 2023-12-14 — End: 2023-12-14
  Administered 2023-12-14 (×2): 60 mg/h via INTRAVENOUS
  Filled 2023-12-14: qty 200

## 2023-12-14 MED ORDER — METOPROLOL TARTRATE 5 MG/5ML IV SOLN
5.0000 mg | INTRAVENOUS | Status: DC | PRN
  Administered 2023-12-14 (×2): 5 mg via INTRAVENOUS
  Filled 2023-12-14 (×2): qty 5

## 2023-12-14 MED ORDER — AMIODARONE LOAD VIA INFUSION
150.0000 mg | Freq: Once | INTRAVENOUS | Status: AC
  Administered 2023-12-14: 150 mg via INTRAVENOUS
  Filled 2023-12-14: qty 83.34

## 2023-12-14 NOTE — Plan of Care (Signed)
  Problem: Education: Goal: Knowledge of General Education information will improve Description: Including pain rating scale, medication(s)/side effects and non-pharmacologic comfort measures Outcome: Progressing   Problem: Health Behavior/Discharge Planning: Goal: Ability to manage health-related needs will improve Outcome: Progressing   Problem: Clinical Measurements: Goal: Ability to maintain clinical measurements within normal limits will improve Outcome: Progressing Goal: Will remain free from infection Outcome: Progressing Goal: Diagnostic test results will improve Outcome: Progressing Goal: Respiratory complications will improve Outcome: Progressing Goal: Cardiovascular complication will be avoided Outcome: Progressing   Problem: Activity: Goal: Risk for activity intolerance will decrease Outcome: Progressing   Problem: Nutrition: Goal: Adequate nutrition will be maintained Outcome: Progressing   Problem: Coping: Goal: Level of anxiety will decrease Outcome: Progressing   Problem: Elimination: Goal: Will not experience complications related to bowel motility Outcome: Progressing Goal: Will not experience complications related to urinary retention Outcome: Progressing   Problem: Pain Managment: Goal: General experience of comfort will improve and/or be controlled Outcome: Progressing   Problem: Safety: Goal: Ability to remain free from injury will improve Outcome: Progressing   Problem: Skin Integrity: Goal: Risk for impaired skin integrity will decrease Outcome: Progressing   Problem: Education: Goal: Understanding of cardiac disease, CV risk reduction, and recovery process will improve Outcome: Progressing Goal: Individualized Educational Video(s) Outcome: Progressing   Problem: Activity: Goal: Ability to tolerate increased activity will improve Outcome: Progressing   Problem: Cardiac: Goal: Ability to achieve and maintain adequate cardiovascular  perfusion will improve Outcome: Progressing   Problem: Health Behavior/Discharge Planning: Goal: Ability to safely manage health-related needs after discharge will improve Outcome: Progressing   Problem: Education: Goal: Ability to describe self-care measures that may prevent or decrease complications (Diabetes Survival Skills Education) will improve Outcome: Progressing Goal: Individualized Educational Video(s) Outcome: Progressing   Problem: Coping: Goal: Ability to adjust to condition or change in health will improve Outcome: Progressing   Problem: Fluid Volume: Goal: Ability to maintain a balanced intake and output will improve Outcome: Progressing   Problem: Health Behavior/Discharge Planning: Goal: Ability to identify and utilize available resources and services will improve Outcome: Progressing Goal: Ability to manage health-related needs will improve Outcome: Progressing   Problem: Metabolic: Goal: Ability to maintain appropriate glucose levels will improve Outcome: Progressing   Problem: Nutritional: Goal: Maintenance of adequate nutrition will improve Outcome: Progressing Goal: Progress toward achieving an optimal weight will improve Outcome: Progressing   Problem: Skin Integrity: Goal: Risk for impaired skin integrity will decrease Outcome: Progressing   Problem: Tissue Perfusion: Goal: Adequacy of tissue perfusion will improve Outcome: Progressing   Problem: Education: Goal: Understanding of CV disease, CV risk reduction, and recovery process will improve Outcome: Progressing Goal: Individualized Educational Video(s) Outcome: Progressing   Problem: Activity: Goal: Ability to return to baseline activity level will improve Outcome: Progressing   Problem: Cardiovascular: Goal: Ability to achieve and maintain adequate cardiovascular perfusion will improve Outcome: Progressing Goal: Vascular access site(s) Level 0-1 will be maintained Outcome:  Progressing   Problem: Health Behavior/Discharge Planning: Goal: Ability to safely manage health-related needs after discharge will improve Outcome: Progressing

## 2023-12-14 NOTE — Progress Notes (Addendum)
 [facility] night cross cover note:   Elevated HR into the 140's. Patient was reported to have had potential Afib while in 2H. Other VS at this time appear stable, including AF, SBP in the 110's mmHg; O2 sat high 90's to 100% on RA. Added prn iv lopressor  sustained HR > 130. Potassium level this AM: 3.5. I subsequently ordered kcl 40 meq po x 1 dose now, and added-on a magnesium  level. CXR performed a few hours ago showed no acute process. Cardiology previously consulted and are actively following this pt. Updated EKG ordered.    Eva Pore, DO Hospitalist

## 2023-12-14 NOTE — Progress Notes (Signed)
 Rounding Note    Patient Name: Brittany Acosta Date of Encounter: 12/14/2023   [facility] Cardiologist: Shelda Bruckner, MD   Subjective   Went into atrial fib/flutter around 6 PM last night, has had sustained heart rates in the 140 range. She does not feel palpitations, just generally feels weak and tired. Has some pleuritic chest pain.  Inpatient Medications    Scheduled Meds:  allopurinol   300 mg Oral Daily   benzonatate   200 mg Oral BID   Chlorhexidine  Gluconate Cloth  6 each Topical Daily   colchicine   0.6 mg Oral BID   vitamin B-12  500 mcg Oral Daily   docusate sodium   100 mg Oral BID   doxepin   10 mg Oral Daily   ferrous sulfate   325 mg Oral Q breakfast   ibuprofen   600 mg Oral TID   insulin  aspart  0-15 Units Subcutaneous TID WC   insulin  glargine  15 Units Subcutaneous Q24H   lamoTRIgine   200 mg Oral Q1200   levothyroxine   50 mcg Oral Daily   lisinopril   5 mg Oral Daily   oxybutynin   10 mg Oral QHS   pantoprazole   40 mg Oral BID   pravastatin   20 mg Oral QHS   sodium chloride  flush  3 mL Intravenous Q12H   sodium chloride   1 g Oral BID WC   Continuous Infusions:  PRN Meds: acetaminophen  **OR** acetaminophen , albuterol , cyclobenzaprine , HYDROcodone  bit-homatropine, metoprolol  tartrate, morphine  injection, ondansetron  **OR** ondansetron  (ZOFRAN ) IV, mouth rinse, polyethylene glycol, sodium chloride  flush   Vital Signs    Vitals:   12/13/23 2249 12/13/23 2333 12/14/23 0341 12/14/23 0727  BP: (!) 159/62  (!) 109/97 133/75  Pulse: 90  82 (!) 140  Resp: (!) 22   (!) 21  Temp: 98.2 F (36.8 C)  98.6 F (37 C) 97.8 F (36.6 C)  TempSrc: Oral  Oral   SpO2: 97% 96% 100% 98%  Weight:      Height:        Intake/Output Summary (Last 24 hours) at 12/14/2023 0759 Last data filed at 12/13/2023 2355 Gross per 24 hour  Intake 1771.51 ml  Output 1480 ml  Net 291.51 ml      12/13/2023    6:00 AM 12/08/2023   11:03 PM 12/08/2023    3:43 PM  Last 3  Weights  Weight (lbs) 214 lb 11.7 oz 209 lb 214 lb  Weight (kg) 97.4 kg 94.802 kg 97.07 kg      Telemetry    Went into sustained afib/flutter at 6 PM yesterday, sustained around 140 - Personally Reviewed  Physical Exam   GEN: No acute distress.   Neck: No JVD sitting upright Cardiac: tachycardic, regular, no murmurs, rubs, or gallops.  Respiratory: Clear to auscultation bilaterally. GI: Soft, nontender, non-distended  MS: trivial LE edema; No deformity. Neuro:  Nonfocal  Psych: Normal affect   New pertinent results (labs, ECG, imaging, cardiac studies)    Echoes, pericardiocentesis from this admission personally reviewed  Assessment & Plan    Pericardial effusion without tamponade Pericarditis -s/p pericardicentesis and drain placement 9/5, drain removed 9/8, repeat echo 9/9 without significant reaccumulation -fluid cell count consistent with inflammation -being treated with NSAIDs and colchicine   Atrial fibrillation/flutter -new around 6 PM 9/9 -initially irregular, but has been regular and right at ~140 beats/min, more consistent with flutter -no prior history of documented fib/flutter, suspect this is being driven by her pericarditis -with recent pericardial effusion/drain, and need for NSAIDs, will  try to convert her back to sinus with IV amiodarone  and then prevent recurrence with oral amiodarone  for the short term. If she remains in this rhythm for 48 hours, will need to consider anticoagulation and change in treatment strategy for pericarditis  Hyponatremia -nephrology consulted, appreciate recommendations    Signed, Shelda Bruckner, MD  12/14/2023, 7:59 AM

## 2023-12-14 NOTE — Progress Notes (Addendum)
 MD notified given patient wheezing, continuous cough, IVF discontinuation. Request for NEB tx and possible dose of lasix  given wheeze and lung sounds. CXR ordered by MD. 1x dose 20mg  lasix . And Neb tx given. Pt reports she typically takes 40mg  at home daily but has been held while in hospital. No swelling to BLE. Md notified of possible rhythm change just prior to transfer per report nurse on 2H. And concern of possible Afib, Pt reports pain in chest does feel like a flutter.

## 2023-12-14 NOTE — Progress Notes (Signed)
 PT Cancellation Note  Patient Details Name: Brittany Acosta MRN: [id number] DOB: 1950-12-04   Cancelled Treatment:    Reason Eval/Treat Not Completed: Medical issues which prohibited therapy; RN reports rate still 135 resting.  Will follow up another day.   Montie Portal 12/14/2023, 3:43 PM Micheline Portal, PT Acute Rehabilitation Services Office:[phone] 12/14/2023

## 2023-12-14 NOTE — Progress Notes (Signed)
 PROGRESS NOTE    Brittany Acosta  FMW:[id number] DOB: [birth year] DOA: 12/08/2023 PCP: [name], MD   Brief Narrative:    73 y.o. female with a past medical history of diabetes mellitus type 2 on insulin , essential hypertension, Psoriatec arthropathy, history of gout, history of breast cancer, hyperlipidemia who was in her usual state of health till about 2 to 3 days ago when she started developing shortness of breath along with chest pain.  CT scan chest  which raise concern for moderate to large pericardial effusion. Case was discussed with cardiology who recommended hospitalization for further evaluation.  She was taken for OR for pericardiocentesis. She went into Afib with RVR on the night of 9/9, started on amiodarone  drip on 9/10. Cardiology on board.  Assessment & Plan:  Principal Problem:   Pericardial effusion Active Problems:   Hypothyroidism   Hypertension associated with diabetes (HCC)   Poorly controlled type 2 diabetes mellitus with circulatory disorder (HCC)   Gout, tophaceous   Psoriatic arthropathy (HCC)   Hyponatremia    Afib/flutter with RVR: Developed on the night of 9/9. Started on amiodarone  drip after amio bolus on 9/10. Cardiology on board Continue tele monitoring.  Pericardial effusion:  Unclear etiology, differential include uremic etiology, vs viral infection.  ESR is 55, CRP is 23 Echocardiogram showed moderate effusion, with JVD and symptomatic with sob and intermittent chest pain.  S/p pericardiocentesis with 9/5 with out. Pericardial fluid sent for analysis, it was bloody in appearance. Gram stain is negative and cultures are pending.  Cytology was sent in view of her  history of breast cancer and is negative for malignancy. She was empirically started on colchicine  0.6 mg BID along with ibuprofen  600 mg TID.  Repeat echo shows resolution of the pericardial effusion, drain removed by cardiology on 9/8.        Hypokalemia Replaced.      Hyponatremia; secondary to SIADH/medications/Hypothyroidism.   TSH slightly high, free t4 elevated. Urine sodium is less than 30, and urine osmolality is 187. Serum osmo is 271. Am cortisol is 8.8  Suspect from medications , she remains asymptomatic.  Stopped zoloft  Since she underwent pericardiocentesis and almost 900 ml fluid drained Nephrology on board. F/u sodium levels daily     Hypothyroidism:  Resume synthroid . Abnormal thyroid  panel  with slightly elevated TSH and elevated free t4.  Recheck thyroid  panel in 4 weeks.      Type 2 DM with hyperglycemia:  Continue monitoring and SSI     Hyperlipidemia Resumed pravastatin  20 mg daily.      Bipolar disorder Resumed Zoloft      Normocytic anemia Baseline hemoglobin appears to be around 10.     Vitamin b12 deficiency/ Iron deficiency anemia Replacements ordered.      DVT prophylaxis: SCD's Start: 12/09/23 1559 SCDs Start: 12/08/23 2335     Code Status: Full Code Family Communication:  None at the bedside Status is: Inpatient Remains inpatient appropriate because: Afib with RVR, s/p pericardiocentesis    Subjective:  Feels tired and is complaining of persistent cough and left sided chest pain. She went into Afib/flutter last night. She will be started on amiodarone  drip this morning.  Examination:  General exam: Appears fatigued Respiratory system: Clear to auscultation. Respiratory effort normal. Cardiovascular system: Irregularly irregular rhythm, tachycardia,No pedal edema. Gastrointestinal system: Abdomen is nondistended, soft and nontender. No organomegaly or masses felt. Normal bowel sounds heard. Central nervous system: Alert and oriented. No focal neurological deficits. Extremities: Symmetric  5 x 5 power. Skin: No rashes, lesions or ulcers Psychiatry: Judgement and insight appear normal. Mood & affect appropriate.     Diet Orders (From admission, onward)     Start     Ordered   12/09/23  1559  Diet Heart Room service appropriate? Yes; Fluid consistency: Thin  Diet effective now       Question Answer Comment  Room service appropriate? Yes   Fluid consistency: Thin      12/09/23 1558            Objective: Vitals:   12/13/23 2333 12/14/23 0341 12/14/23 0727 12/14/23 0829  BP:  (!) 109/97 133/75 (!) 143/67  Pulse:  82 (!) 140 (!) 133  Resp:   (!) 21 (!) 21  Temp:  98.6 F (37 C) 97.8 F (36.6 C) 98.4 F (36.9 C)  TempSrc:  Oral  Oral  SpO2: 96% 100% 98% 100%  Weight:      Height:        Intake/Output Summary (Last 24 hours) at 12/14/2023 0922 Last data filed at 12/13/2023 2355 Gross per 24 hour  Intake 1646.51 ml  Output 980 ml  Net 666.51 ml   Filed Weights   12/08/23 1543 12/08/23 2303 12/13/23 0600  Weight: 97.1 kg 94.8 kg 97.4 kg    Scheduled Meds:  allopurinol   300 mg Oral Daily   amiodarone   150 mg Intravenous Once   benzonatate   200 mg Oral BID   Chlorhexidine  Gluconate Cloth  6 each Topical Daily   colchicine   0.6 mg Oral BID   vitamin B-12  500 mcg Oral Daily   docusate sodium   100 mg Oral BID   doxepin   10 mg Oral Daily   ferrous sulfate   325 mg Oral Q breakfast   ibuprofen   600 mg Oral TID   insulin  aspart  0-15 Units Subcutaneous TID WC   insulin  glargine  15 Units Subcutaneous Q24H   lamoTRIgine   200 mg Oral Q1200   levothyroxine   50 mcg Oral Daily   lisinopril   5 mg Oral Daily   oxybutynin   10 mg Oral QHS   pantoprazole   40 mg Oral BID   pravastatin   20 mg Oral QHS   sodium chloride  flush  3 mL Intravenous Q12H   sodium chloride   1 g Oral BID WC   Continuous Infusions:  amiodarone      Followed by   amiodarone       Nutritional status     Body mass index is 39.92 kg/m.  Data Reviewed:   CBC: Recent Labs  Lab 12/10/23 0312 12/11/23 0327 12/12/23 0333 12/13/23 0142 12/14/23 0222  WBC 16.8* 15.0* 15.2* 14.5* 12.4*  NEUTROABS 12.1* 10.9*  --  10.3* 8.0*  HGB 9.8* 9.8* 9.0* 8.8* 8.1*  HCT 29.9* 28.9* 26.9* 26.0*  24.5*  MCV 85.2 83.5 83.0 82.8 83.1  PLT 458* 483* 569* 617* 592*   Basic Metabolic Panel: Recent Labs  Lab 12/10/23 0312 12/11/23 0327 12/12/23 0333 12/12/23 2041 12/13/23 0142 12/13/23 1523 12/14/23 0221 12/14/23 0222  NA 130* 129* 127* 127* 125* 131*  --  130*  K 4.0 4.1 3.8  --  4.0  --   --  3.5  CL 97* 96* 94*  --  92*  --   --  96*  CO2 23 22 22   --  22  --   --  22  GLUCOSE 178* 158* 166*  --  246*  --   --  156*  BUN  9 9 7*  --  9  --   --  10  CREATININE 0.74 0.88 0.61  --  0.84  --   --  0.70  CALCIUM  8.1* 8.6* 8.1*  --  8.1*  --   --  8.2*  MG 1.7  --   --   --   --   --  1.5*  --   PHOS  --   --   --   --   --   --   --  4.0   GFR: Estimated Creatinine Clearance: 67.6 mL/min (by C-G formula based on SCr of 0.7 mg/dL). Liver Function Tests: Recent Labs  Lab 12/08/23 1700 12/09/23 0431 12/14/23 0222  AST 19 14*  --   ALT 12 11  --   ALKPHOS 105 78  --   BILITOT 0.4 0.5  --   PROT 7.1 6.1*  --   ALBUMIN  3.8 2.8* 2.2*   No results for input(s): LIPASE, AMYLASE in the last 168 hours. No results for input(s): AMMONIA in the last 168 hours. Coagulation Profile: No results for input(s): INR, PROTIME in the last 168 hours. Cardiac Enzymes: No results for input(s): CKTOTAL, CKMB, CKMBINDEX, TROPONINI in the last 168 hours. BNP (last 3 results) Recent Labs    12/08/23 1700  PROBNP 725.0*   HbA1C: No results for input(s): HGBA1C in the last 72 hours. CBG: Recent Labs  Lab 12/12/23 2115 12/13/23 0749 12/13/23 1132 12/13/23 1632 12/14/23 0726  GLUCAP 173* 278* 127* 104* 156*   Lipid Profile: No results for input(s): CHOL, HDL, LDLCALC, TRIG, CHOLHDL, LDLDIRECT in the last 72 hours. Thyroid  Function Tests: No results for input(s): TSH, T4TOTAL, FREET4, T3FREE, THYROIDAB in the last 72 hours. Anemia Panel: No results for input(s): VITAMINB12, FOLATE, FERRITIN, TIBC, IRON, RETICCTPCT in the last 72  hours. Sepsis Labs: Recent Labs  Lab 12/11/23 0327  PROCALCITON <0.10    Recent Results (from the past 240 hours)  Surgical pcr screen     Status: None   Collection Time: 12/08/23  2:54 PM   Specimen: Nasal Mucosa; Nasal Swab  Result Value Ref Range Status   MRSA, PCR NEGATIVE NEGATIVE Final   Staphylococcus aureus NEGATIVE NEGATIVE Final    Comment: (NOTE) The Xpert SA Assay (FDA approved for NASAL specimens in patients 41 years of age and older), is one component of a comprehensive surveillance program. It is not intended to diagnose infection nor to guide or monitor treatment. Performed at [facility], [address], [place], [place] [address]   Resp panel by RT-PCR (RSV, Flu A&B, Covid) Anterior Nasal Swab     Status: None   Collection Time: 12/08/23  4:53 PM   Specimen: Anterior Nasal Swab  Result Value Ref Range Status   SARS Coronavirus 2 by RT PCR NEGATIVE NEGATIVE Final    Comment: (NOTE) SARS-CoV-2 target nucleic acids are NOT DETECTED.  The SARS-CoV-2 RNA is generally detectable in upper respiratory specimens during the acute phase of infection. The lowest concentration of SARS-CoV-2 viral copies this assay can detect is 138 copies/mL. A negative result does not preclude SARS-Cov-2 infection and should not be used as the sole basis for treatment or other patient management decisions. A negative result may occur with  improper specimen collection/handling, submission of specimen other than nasopharyngeal swab, presence of viral mutation(s) within the areas targeted by this assay, and inadequate number of viral copies(<138 copies/mL). A negative result must be combined with clinical observations, patient  history, and epidemiological information. The expected result is Negative.  Fact Sheet for Patients:  BloggerCourse.com  Fact Sheet for Healthcare Providers:  SeriousBroker.it  This test  is no t yet approved or cleared by the [place]  FDA and  has been authorized for detection and/or diagnosis of SARS-CoV-2 by FDA under an Emergency Use Authorization (EUA). This EUA will remain  in effect (meaning this test can be used) for the duration of the COVID-19 declaration under Section 564(b)(1) of the Act, 21 U.S.C.section 360bbb-3(b)(1), unless the authorization is terminated  or revoked sooner.       Influenza A by PCR NEGATIVE NEGATIVE Final   Influenza B by PCR NEGATIVE NEGATIVE Final    Comment: (NOTE) The Xpert Xpress SARS-CoV-2/FLU/RSV plus assay is intended as an aid in the diagnosis of influenza from Nasopharyngeal swab specimens and should not be used as a sole basis for treatment. Nasal washings and aspirates are unacceptable for Xpert Xpress SARS-CoV-2/FLU/RSV testing.  Fact Sheet for Patients: BloggerCourse.com  Fact Sheet for Healthcare Providers: SeriousBroker.it  This test is not yet approved or cleared by the [place]  FDA and has been authorized for detection and/or diagnosis of SARS-CoV-2 by FDA under an Emergency Use Authorization (EUA). This EUA will remain in effect (meaning this test can be used) for the duration of the COVID-19 declaration under Section 564(b)(1) of the Act, 21 U.S.C. section 360bbb-3(b)(1), unless the authorization is terminated or revoked.     Resp Syncytial Virus by PCR NEGATIVE NEGATIVE Final    Comment: (NOTE) Fact Sheet for Patients: BloggerCourse.com  Fact Sheet for Healthcare Providers: SeriousBroker.it  This test is not yet approved or cleared by the [place]  FDA and has been authorized for detection and/or diagnosis of SARS-CoV-2 by FDA under an Emergency Use Authorization (EUA). This EUA will remain in effect (meaning this test can be used) for the duration of the COVID-19 declaration under Section  564(b)(1) of the Act, 21 U.S.C. section 360bbb-3(b)(1), unless the authorization is terminated or revoked.  Performed at [facility], [address], [place], [place] [address]   Body fluid culture w Gram Stain     Status: None   Collection Time: 12/09/23  3:04 PM   Specimen: PATH Cytology Misc. fluid; Body Fluid  Result Value Ref Range Status   Specimen Description PERICARDIAL  Final   Special Requests NONE  Final   Gram Stain   Final    FEW WBC PRESENT, PREDOMINANTLY MONONUCLEAR NO ORGANISMS SEEN    Culture   Final    NO GROWTH 3 DAYS Performed at [facility], [address], [place], [place] [address]    Report Status 12/12/2023 FINAL  Final  Respiratory (~20 pathogens) panel by PCR     Status: None   Collection Time: 12/11/23  4:02 PM   Specimen: Nasopharyngeal Swab; Respiratory  Result Value Ref Range Status   Adenovirus NOT DETECTED NOT DETECTED Final   Coronavirus 229E NOT DETECTED NOT DETECTED Final    Comment: (NOTE) The Coronavirus on the Respiratory Panel, DOES NOT test for the novel  Coronavirus (2019 nCoV)    Coronavirus HKU1 NOT DETECTED NOT DETECTED Final   Coronavirus NL63 NOT DETECTED NOT DETECTED Final   Coronavirus OC43 NOT DETECTED NOT DETECTED Final   Metapneumovirus NOT DETECTED NOT DETECTED Final   Rhinovirus / Enterovirus NOT DETECTED NOT DETECTED Final   Influenza A NOT DETECTED NOT DETECTED Final   Influenza B NOT DETECTED NOT DETECTED Final  Parainfluenza Virus 1 NOT DETECTED NOT DETECTED Final   Parainfluenza Virus 2 NOT DETECTED NOT DETECTED Final   Parainfluenza Virus 3 NOT DETECTED NOT DETECTED Final   Parainfluenza Virus 4 NOT DETECTED NOT DETECTED Final   Respiratory Syncytial Virus NOT DETECTED NOT DETECTED Final   Bordetella pertussis NOT DETECTED NOT DETECTED Final   Bordetella Parapertussis NOT DETECTED NOT DETECTED Final   Chlamydophila pneumoniae NOT DETECTED NOT DETECTED Final   Mycoplasma pneumoniae  NOT DETECTED NOT DETECTED Final    Comment: Performed at [facility], [address], [place], [place] [address]         Radiology Studies: DG Chest Port 1 View Result Date: 12/13/2023 CLINICAL DATA:  Wheezing EXAM: PORTABLE CHEST 1 VIEW COMPARISON:  12/11/2023 FINDINGS: Single frontal view of the chest demonstrates stable enlargement of the cardiac silhouette. No acute airspace disease, effusion, or pneumothorax. No acute bony abnormalities. IMPRESSION: 1. Stable chest, no acute process. Electronically Signed   By: Ozell Daring M.D.   On: 12/13/2023 23:59   ECHOCARDIOGRAM LIMITED Result Date: 12/13/2023    ECHOCARDIOGRAM LIMITED REPORT   Patient Name:   Brittany Acosta Date of Exam: 12/13/2023 Medical Rec #:  [id number]     Height:       61.5 in Accession #:    [id number]    Weight:       214.7 lb Date of Birth:  07-03-1950      BSA:          1.958 m Patient Age:    73 years      BP:           148/98 mmHg Patient Gender: F             HR:           95 bpm. Exam Location:  Inpatient Procedure: Limited Echo (Both Spectral and Color Flow Doppler were utilized            during procedure). Indications:    Pericardial effusion I31.3  History:        Patient has prior history of Echocardiogram examinations, most                 recent 12/10/2023. Pericardial effusion S/P peracentsis, Limited                 echo to check for effusion.  Sonographer:    BERNARDA ROCKS Referring Phys: [id number] AMY D CLEGG IMPRESSIONS  1. Left ventricular ejection fraction, by estimation, is 65 to 70%. The left ventricle has normal function. The left ventricle has no regional wall motion abnormalities.  2. Right ventricular systolic function is normal. The right ventricular size is normal.  3. The mitral valve is normal in structure.  4. The aortic valve is normal in structure.  5. The inferior vena cava is normal in size with greater than 50% respiratory variability, suggesting right atrial pressure of 3 mmHg. Comparison(s): No  significant change from prior study. Prior images reviewed side by side. There is no evidence of recurrent pericardial effusion. FINDINGS  Left Ventricle: Left ventricular ejection fraction, by estimation, is 65 to 70%. The left ventricle has normal function. The left ventricle has no regional wall motion abnormalities. Right Ventricle: The right ventricular size is normal. Right ventricular systolic function is normal. Pericardium: There is no evidence of pericardial effusion. Presence of epicardial fat layer. Mitral Valve: The mitral valve is normal in structure. Tricuspid Valve: The tricuspid valve is  normal in structure. Aortic Valve: The aortic valve is normal in structure. Pulmonic Valve: The pulmonic valve was not well visualized. Venous: The inferior vena cava is normal in size with greater than 50% respiratory variability, suggesting right atrial pressure of 3 mmHg. Jerel Balding MD Electronically signed by Jerel Balding MD Signature Date/Time: 12/13/2023/1:34:20 PM    Final            LOS: 6 days   Time spent= 35 mins    Deliliah Room, MD [facility]  If 7PM-7AM, please contact night-coverage  12/14/2023, 9:22 AM

## 2023-12-14 NOTE — Progress Notes (Cosign Needed)
 IV attempt RFA x1 unsuccessful. Primary RN made aware.

## 2023-12-14 NOTE — Progress Notes (Signed)
 [facility] NEPHROLOGY PROGRESS NOTE  Assessment/ Plan: Pt is a 73 y.o. yo female  with past medical history significant for type 2 diabetes, hypertension, hypothyroidism, psoriatic arthropathy, history of gout, breast cancer, dyslipidemia who was presented with shortness of breath and chest pain seen as a consultation for the evaluation of hyponatremia.   # Subacute hyponatremia likely due to combination of SIADH related with Zoloft /pain, hypothyroidism and may have some component of volume depletion. - Treated with normal saline and then salt tablet with improvement of serum sodium level to 130.  Currently IV fluid is off.   Agree with discontinuing Zoloft .   - Can discontinue salt tablet if sodium level remains 130 or above in next few days.   # Hypokalemia: Potassium level improved.   # Pericardial effusion, etiology unclear at this time.  Status post pericardiocentesis on 7/5.  Currently on colchicine  and NSAIDs per primary team and cardiology.   # Hypothyroidism: TSH elevated, on Synthroid .  Per primary team.  # A-fib with RVR: Seen by cardiology team, starting amiodarone .  Sign off, please call us  back with question.  Subjective: Seen and examined at bedside.  Patient was transferred from ICU to the floor.  Had a episode of A-fib with RVR this morning.  Urine output is 1.4 L and sodium level improved to 130.  Discussed with nursing staff. Objective Vital signs in last 24 hours: Vitals:   12/13/23 2333 12/14/23 0341 12/14/23 0727 12/14/23 0829  BP:  (!) 109/97 133/75 (!) 143/67  Pulse:  82 (!) 140 (!) 133  Resp:   (!) 21 (!) 21  Temp:  98.6 F (37 C) 97.8 F (36.6 C) 98.4 F (36.9 C)  TempSrc:  Oral  Oral  SpO2: 96% 100% 98% 100%  Weight:      Height:       Weight change:   Intake/Output Summary (Last 24 hours) at 12/14/2023 0949 Last data filed at 12/13/2023 2355 Gross per 24 hour  Intake 1641.51 ml  Output 980 ml  Net 661.51 ml       Labs: RENAL  PANEL Recent Labs  Lab 12/08/23 1700 12/09/23 0431 12/10/23 0312 12/11/23 0327 12/12/23 0333 12/12/23 2041 12/13/23 0142 12/13/23 1523 12/14/23 0221 12/14/23 0222  NA  --  129* 130* 129* 127* 127* 125* 131*  --  130*  K  --  3.3* 4.0 4.1 3.8  --  4.0  --   --  3.5  CL  --  95* 97* 96* 94*  --  92*  --   --  96*  CO2  --  24 23 22 22   --  22  --   --  22  GLUCOSE  --  125* 178* 158* 166*  --  246*  --   --  156*  BUN  --  9 9 9  7*  --  9  --   --  10  CREATININE  --  0.71 0.74 0.88 0.61  --  0.84  --   --  0.70  CALCIUM   --  8.3* 8.1* 8.6* 8.1*  --  8.1*  --   --  8.2*  MG  --   --  1.7  --   --   --   --   --  1.5*  --   PHOS  --   --   --   --   --   --   --   --   --  4.0  ALBUMIN  3.8 2.8*  --   --   --   --   --   --   --  2.2*    Liver Function Tests: Recent Labs  Lab 12/08/23 1700 12/09/23 0431 12/14/23 0222  AST 19 14*  --   ALT 12 11  --   ALKPHOS 105 78  --   BILITOT 0.4 0.5  --   PROT 7.1 6.1*  --   ALBUMIN  3.8 2.8* 2.2*   No results for input(s): LIPASE, AMYLASE in the last 168 hours. No results for input(s): AMMONIA in the last 168 hours. CBC: Recent Labs    11/22/23 1641 12/08/23 1544 12/09/23 0431 12/10/23 0312 12/11/23 0327 12/12/23 0333 12/13/23 0142 12/14/23 0222  HGB 11.0*   < > 9.8* 9.8* 9.8* 9.0* 8.8* 8.1*  MCV 84.4   < > 81.9 85.2 83.5 83.0 82.8 83.1  VITAMINB12 273  --  185  --   --   --   --   --   FOLATE  --   --  >20.0  --   --   --   --   --   FERRITIN 30.4  --  115  --   --   --   --   --   TIBC 296.8  --  231*  --   --   --   --   --   IRON 79  --  <10*  --   --   --   --   --   RETICCTPCT  --   --  1.2  --   --   --   --   --    < > = values in this interval not displayed.    Cardiac Enzymes: No results for input(s): CKTOTAL, CKMB, CKMBINDEX, TROPONINI in the last 168 hours. CBG: Recent Labs  Lab 12/12/23 2115 12/13/23 0749 12/13/23 1132 12/13/23 1632 12/14/23 0726  GLUCAP 173* 278* 127* 104* 156*     Iron Studies: No results for input(s): IRON, TIBC, TRANSFERRIN, FERRITIN in the last 72 hours. Studies/Results: DG Chest Port 1 View Result Date: 12/13/2023 CLINICAL DATA:  Wheezing EXAM: PORTABLE CHEST 1 VIEW COMPARISON:  12/11/2023 FINDINGS: Single frontal view of the chest demonstrates stable enlargement of the cardiac silhouette. No acute airspace disease, effusion, or pneumothorax. No acute bony abnormalities. IMPRESSION: 1. Stable chest, no acute process. Electronically Signed   By: Ozell Daring M.D.   On: 12/13/2023 23:59   ECHOCARDIOGRAM LIMITED Result Date: 12/13/2023    ECHOCARDIOGRAM LIMITED REPORT   Patient Name:   Brittany Acosta Date of Exam: 12/13/2023 Medical Rec #:  [id number]     Height:       61.5 in Accession #:    [id number]    Weight:       214.7 lb Date of Birth:  [birth year]      BSA:          1.958 m Patient Age:    73 years      BP:           148/98 mmHg Patient Gender: F             HR:           95 bpm. Exam Location:  Inpatient Procedure: Limited Echo (Both Spectral and Color Flow Doppler were utilized            during procedure). Indications:    Pericardial effusion I31.3  History:  Patient has prior history of Echocardiogram examinations, most                 recent 12/10/2023. Pericardial effusion S/P peracentsis, Limited                 echo to check for effusion.  Sonographer:    BERNARDA ROCKS Referring Phys: [id number] AMY D CLEGG IMPRESSIONS  1. Left ventricular ejection fraction, by estimation, is 65 to 70%. The left ventricle has normal function. The left ventricle has no regional wall motion abnormalities.  2. Right ventricular systolic function is normal. The right ventricular size is normal.  3. The mitral valve is normal in structure.  4. The aortic valve is normal in structure.  5. The inferior vena cava is normal in size with greater than 50% respiratory variability, suggesting right atrial pressure of 3 mmHg. Comparison(s): No significant change from prior  study. Prior images reviewed side by side. There is no evidence of recurrent pericardial effusion. FINDINGS  Left Ventricle: Left ventricular ejection fraction, by estimation, is 65 to 70%. The left ventricle has normal function. The left ventricle has no regional wall motion abnormalities. Right Ventricle: The right ventricular size is normal. Right ventricular systolic function is normal. Pericardium: There is no evidence of pericardial effusion. Presence of epicardial fat layer. Mitral Valve: The mitral valve is normal in structure. Tricuspid Valve: The tricuspid valve is normal in structure. Aortic Valve: The aortic valve is normal in structure. Pulmonic Valve: The pulmonic valve was not well visualized. Venous: The inferior vena cava is normal in size with greater than 50% respiratory variability, suggesting right atrial pressure of 3 mmHg. Jerel Croitoru MD Electronically signed by Jerel Balding MD Signature Date/Time: 12/13/2023/1:34:20 PM    Final     Medications: Infusions:  amiodarone      Followed by   amiodarone       Scheduled Medications:  allopurinol   300 mg Oral Daily   amiodarone   150 mg Intravenous Once   benzonatate   200 mg Oral BID   Chlorhexidine  Gluconate Cloth  6 each Topical Daily   colchicine   0.6 mg Oral BID   vitamin B-12  500 mcg Oral Daily   docusate sodium   100 mg Oral BID   doxepin   10 mg Oral Daily   ferrous sulfate   325 mg Oral Q breakfast   ibuprofen   600 mg Oral TID   insulin  aspart  0-15 Units Subcutaneous TID WC   insulin  glargine  15 Units Subcutaneous Q24H   lamoTRIgine   200 mg Oral Q1200   levothyroxine   50 mcg Oral Daily   lisinopril   5 mg Oral Daily   oxybutynin   10 mg Oral QHS   pantoprazole   40 mg Oral BID   pravastatin   20 mg Oral QHS   sodium chloride  flush  3 mL Intravenous Q12H   sodium chloride   1 g Oral BID WC    have reviewed scheduled and prn medications.  Physical Exam: General:NAD, comfortable Heart: Tachycardic, s1s2  nl Lungs:clear b/l, no crackle Abdomen:soft, Non-tender, non-distended Extremities:No edema Neurology: Alert awake and following commands  Brittany Acosta 12/14/2023,9:49 AM  LOS: 6 days

## 2023-12-14 NOTE — Significant Event (Signed)
 Rapid Response Event Note   Reason for Call :  HR 140s  Initial Focused Assessment:  Patient is alert and oriented.  She does complain of chest pain 7/10 and mild shortness of breath.  She has a chronic dry cough. Lung sounds diminished bases   BP 112/78  HR 142  RR 20-24  O2 sat 96% on RA   Interventions:  150mg  Amiodarone  bolus infused Amiodarone  gtt 60mg /hr started.  Repeat BP 135/80  HR 136  Plan of Care:  Transfer to cardiac telemetry    Elvin Portland, RN

## 2023-12-15 ENCOUNTER — Ambulatory Visit (HOSPITAL_COMMUNITY): Admission: RE | Admit: 2023-12-15 | Source: Home / Self Care | Admitting: Orthopedic Surgery

## 2023-12-15 ENCOUNTER — Encounter (HOSPITAL_COMMUNITY): Admission: RE | Payer: Self-pay | Source: Home / Self Care

## 2023-12-15 DIAGNOSIS — I3139 Other pericardial effusion (noninflammatory): Secondary | ICD-10-CM | POA: Diagnosis not present

## 2023-12-15 DIAGNOSIS — I4892 Unspecified atrial flutter: Secondary | ICD-10-CM | POA: Diagnosis not present

## 2023-12-15 DIAGNOSIS — I300 Acute nonspecific idiopathic pericarditis: Secondary | ICD-10-CM | POA: Diagnosis not present

## 2023-12-15 LAB — BASIC METABOLIC PANEL WITH GFR
Anion gap: 14 (ref 5–15)
BUN: 9 mg/dL (ref 8–23)
CO2: 22 mmol/L (ref 22–32)
Calcium: 8.5 mg/dL — ABNORMAL LOW (ref 8.9–10.3)
Chloride: 97 mmol/L — ABNORMAL LOW (ref 98–111)
Creatinine, Ser: 0.68 mg/dL (ref 0.44–1.00)
GFR, Estimated: >60 mL/min (ref >60)
Glucose, Bld: 145 mg/dL — ABNORMAL HIGH (ref 70–99)
Potassium: 4.3 mmol/L (ref 3.5–5.1)
Sodium: 133 mmol/L — ABNORMAL LOW (ref 135–145)

## 2023-12-15 LAB — GLUCOSE, CAPILLARY
Glucose-Capillary: 150 mg/dL — ABNORMAL HIGH (ref 70–99)
Glucose-Capillary: 191 mg/dL — ABNORMAL HIGH (ref 70–99)
Glucose-Capillary: 161 mg/dL — ABNORMAL HIGH (ref 70–99)
Glucose-Capillary: 105 mg/dL — ABNORMAL HIGH (ref 70–99)

## 2023-12-15 SURGERY — ARTHROPLASTY, SHOULDER, TOTAL, REVERSE
Anesthesia: Choice | Site: Shoulder | Laterality: Left

## 2023-12-15 MED ORDER — METOPROLOL TARTRATE 12.5 MG HALF TABLET
12.5000 mg | ORAL_TABLET | Freq: Four times a day (QID) | ORAL | Status: DC
  Administered 2023-12-15 – 2023-12-16 (×4): 12.5 mg via ORAL
  Filled 2023-12-15 (×4): qty 1

## 2023-12-15 NOTE — Plan of Care (Signed)
  Problem: Education: Goal: Knowledge of General Education information will improve Description: Including pain rating scale, medication(s)/side effects and non-pharmacologic comfort measures Outcome: Progressing   Problem: Health Behavior/Discharge Planning: Goal: Ability to manage health-related needs will improve Outcome: Progressing   Problem: Clinical Measurements: Goal: Ability to maintain clinical measurements within normal limits will improve Outcome: Progressing Goal: Will remain free from infection Outcome: Progressing Goal: Diagnostic test results will improve Outcome: Progressing Goal: Respiratory complications will improve Outcome: Progressing Goal: Cardiovascular complication will be avoided Outcome: Progressing   Problem: Activity: Goal: Risk for activity intolerance will decrease Outcome: Progressing   Problem: Nutrition: Goal: Adequate nutrition will be maintained Outcome: Progressing   Problem: Coping: Goal: Level of anxiety will decrease Outcome: Progressing   Problem: Elimination: Goal: Will not experience complications related to bowel motility Outcome: Progressing Goal: Will not experience complications related to urinary retention Outcome: Progressing   Problem: Pain Managment: Goal: General experience of comfort will improve and/or be controlled Outcome: Progressing   Problem: Safety: Goal: Ability to remain free from injury will improve Outcome: Progressing   Problem: Skin Integrity: Goal: Risk for impaired skin integrity will decrease Outcome: Progressing   Problem: Education: Goal: Understanding of cardiac disease, CV risk reduction, and recovery process will improve Outcome: Progressing Goal: Individualized Educational Video(s) Outcome: Progressing   Problem: Activity: Goal: Ability to tolerate increased activity will improve Outcome: Progressing   Problem: Cardiac: Goal: Ability to achieve and maintain adequate cardiovascular  perfusion will improve Outcome: Progressing   Problem: Health Behavior/Discharge Planning: Goal: Ability to safely manage health-related needs after discharge will improve Outcome: Progressing   Problem: Education: Goal: Ability to describe self-care measures that may prevent or decrease complications (Diabetes Survival Skills Education) will improve Outcome: Progressing Goal: Individualized Educational Video(s) Outcome: Progressing   Problem: Coping: Goal: Ability to adjust to condition or change in health will improve Outcome: Progressing   Problem: Fluid Volume: Goal: Ability to maintain a balanced intake and output will improve Outcome: Progressing   Problem: Health Behavior/Discharge Planning: Goal: Ability to identify and utilize available resources and services will improve Outcome: Progressing Goal: Ability to manage health-related needs will improve Outcome: Progressing   Problem: Metabolic: Goal: Ability to maintain appropriate glucose levels will improve Outcome: Progressing   Problem: Nutritional: Goal: Maintenance of adequate nutrition will improve Outcome: Progressing Goal: Progress toward achieving an optimal weight will improve Outcome: Progressing   Problem: Skin Integrity: Goal: Risk for impaired skin integrity will decrease Outcome: Progressing   Problem: Tissue Perfusion: Goal: Adequacy of tissue perfusion will improve Outcome: Progressing   Problem: Education: Goal: Understanding of CV disease, CV risk reduction, and recovery process will improve Outcome: Progressing Goal: Individualized Educational Video(s) Outcome: Progressing   Problem: Activity: Goal: Ability to return to baseline activity level will improve Outcome: Progressing   Problem: Cardiovascular: Goal: Ability to achieve and maintain adequate cardiovascular perfusion will improve Outcome: Progressing Goal: Vascular access site(s) Level 0-1 will be maintained Outcome:  Progressing   Problem: Health Behavior/Discharge Planning: Goal: Ability to safely manage health-related needs after discharge will improve Outcome: Progressing

## 2023-12-15 NOTE — Progress Notes (Signed)
 Rounding Note    Patient Name: Brittany Acosta Date of Encounter: 12/15/2023  [facility] Cardiologist: Shelda Bruckner, MD   Subjective   Remains in atrial flutter today in the 130s. Husband in room, discussed today. She is not having any chest pain at rest but does have sharp pain when she coughs. Lying flat in bed comfortably.  Inpatient Medications    Scheduled Meds:  allopurinol   300 mg Oral Daily   benzonatate   200 mg Oral BID   colchicine   0.6 mg Oral BID   docusate sodium   100 mg Oral BID   doxepin   10 mg Oral Daily   ferrous sulfate   325 mg Oral Q breakfast   ibuprofen   600 mg Oral TID   insulin  aspart  0-15 Units Subcutaneous TID WC   insulin  glargine  15 Units Subcutaneous Q24H   lamoTRIgine   200 mg Oral Q1200   levothyroxine   50 mcg Oral Daily   lisinopril   5 mg Oral Daily   metoprolol  tartrate  12.5 mg Oral Q6H   oxybutynin   10 mg Oral QHS   pantoprazole   40 mg Oral BID   pravastatin   20 mg Oral QHS   sodium chloride  flush  3 mL Intravenous Q12H   sodium chloride   1 g Oral BID WC   vitamin B-12  500 mcg Oral Daily   Continuous Infusions:  amiodarone  30 mg/hr (12/15/23 1018)   PRN Meds: acetaminophen  **OR** acetaminophen , albuterol , cyclobenzaprine , HYDROcodone  bit-homatropine, metoprolol  tartrate, morphine  injection, ondansetron  **OR** ondansetron  (ZOFRAN ) IV, mouth rinse, polyethylene glycol, sodium chloride  flush   Vital Signs    Vitals:   12/15/23 0029 12/15/23 0329 12/15/23 0730 12/15/23 1203  BP: 124/88 (!) 122/91 (!) 158/111 (!) 148/92  Pulse: (!) 121 (!) 132 (!) 135   Resp: 20 20 20 20   Temp: 97.6 F (36.4 C) 97.9 F (36.6 C) 98 F (36.7 C) 97.8 F (36.6 C)  TempSrc: Oral Oral Oral Oral  SpO2: 98% 98% 97% 95%  Weight:      Height:        Intake/Output Summary (Last 24 hours) at 12/15/2023 1234 Last data filed at 12/15/2023 1019 Gross per 24 hour  Intake 476.58 ml  Output 450 ml  Net 26.58 ml      12/13/2023    6:00  AM 12/08/2023   11:03 PM 12/08/2023    3:43 PM  Last 3 Weights  Weight (lbs) 214 lb 11.7 oz 209 lb 214 lb  Weight (kg) 97.4 kg 94.802 kg 97.07 kg      Telemetry    Atrial flutter, rates 130s - Personally Reviewed  Physical Exam   GEN: No acute distress.   Neck: No JVD  Cardiac: tachycardic, regular, no murmurs, rubs, or gallops.  Respiratory: Clear to auscultation bilaterally. GI: Soft, nontender, non-distended  MS: trivial LE edema; No deformity. Neuro:  Nonfocal  Psych: Normal affect   New pertinent results (labs, ECG, imaging, cardiac studies)    Echoes, pericardiocentesis from this admission personally reviewed  Assessment & Plan    Pericardial effusion without tamponade Pericarditis -s/p pericardicentesis and drain placement 9/5, drain removed 9/8, repeat echo 9/9 without significant reaccumulation -fluid cell count consistent with inflammation -being treated with NSAIDs and colchicine  -now no chest pain at rest, only pleuritic. If we need to add anticoagulation (below), would decrease NSAID dose now that she has no pain at rest  Atrial fibrillation/flutter -new around 6 PM 9/9 -initially irregular, but has been regular and right  at ~130 beats/min, fltutter waves seen -no prior history of documented fib/flutter, suspect this is being driven by her pericarditis -with recent pericardial effusion/drain, and need for NSAIDs, will try to convert her back to sinus with IV amiodarone  and then prevent recurrence with oral amiodarone  for the short term. If she remains in this rhythm for 48 hours, will need to consider anticoagulation and change in treatment strategy for pericarditis. I discussed this with patient and her husband. -will add metoprolol  to try to aid rate control, but we discussed that flutter can be stubborn to rate control. If this persists, would consider TEE-CV next week  Hyponatremia -nephrology consulted, appreciate recommendations, improving     Signed, Shelda Bruckner, MD  12/15/2023, 12:34 PM

## 2023-12-15 NOTE — Care Management Important Message (Addendum)
 Important Message  Patient Details  Name: Brittany Acosta MRN: [id number] Date of Birth: 1950-09-13   Important Message Given:  Yes - Medicare IM Patient left prior to IM delivery will send to the patient home address.     Jonthan Leite 12/15/2023, 8:59 AM

## 2023-12-15 NOTE — Progress Notes (Signed)
 PT Cancellation Note  Patient Details Name: Brittany Acosta MRN: [id number] DOB: 02/05/1951   Cancelled Treatment:    Reason Eval/Treat Not Completed: Medical issues which prohibited therapy.  Pt has had sustained HR in the 130's without significant changes with rate control meds. 12/15/2023  India HERO., PT [facility] [phone]  (office)   Brittany Acosta 12/15/2023, 4:10 PM

## 2023-12-15 NOTE — Progress Notes (Signed)
 PROGRESS NOTE    Brittany Acosta  FMW:[id number] DOB: 1950/04/21 DOA: 12/08/2023 PCP: [name], MD   Brief Narrative:    73 y.o. female with a past medical history of diabetes mellitus type 2 on insulin , essential hypertension, Psoriatec arthropathy, history of gout, history of breast cancer, hyperlipidemia who was in her usual state of health till about 2 to 3 days ago when she started developing shortness of breath along with chest pain.  CT scan chest  which raise concern for moderate to large pericardial effusion. Case was discussed with cardiology who recommended hospitalization for further evaluation.  She was taken for OR for pericardiocentesis. She went into Afib with RVR on the night of 9/9, started on amiodarone  drip on 9/10. Cardiology on board.  Assessment & Plan:  Principal Problem:   Pericardial effusion Active Problems:   Hypothyroidism   Hypertension associated with diabetes (HCC)   Poorly controlled type 2 diabetes mellitus with circulatory disorder (HCC)   Gout, tophaceous   Psoriatic arthropathy (HCC)   Hyponatremia    Afib/flutter with RVR: Developed on the night of 9/9. Started on amiodarone  drip after amio bolus on 9/10. Cardiology on board Continue tele monitoring. She will need to be started on anticoagulation. I will discuss with her and her husband about that.  Pericardial effusion:  Unclear etiology, differential include uremic etiology, vs viral infection.  ESR is 55, CRP is 23 Echocardiogram showed moderate effusion, with JVD and symptomatic with sob and intermittent chest pain.  S/p pericardiocentesis with 9/5 with out. Pericardial fluid sent for analysis, it was bloody in appearance. Gram stain is negative and cultures are pending.  Cytology was sent in view of her  history of breast cancer and is negative for malignancy. She was empirically started on colchicine  0.6 mg BID along with ibuprofen  600 mg TID.  Repeat echo shows resolution of  the pericardial effusion, drain removed by cardiology on 9/8.        Hypokalemia Replaced.     Hyponatremia; secondary to SIADH/medications/Hypothyroidism. Sodium is 133 today.  TSH slightly high, free t4 elevated. Urine sodium is less than 30, and urine osmolality is 187. Serum osmo is 271. Am cortisol is 8.8  Suspect from medications , she remains asymptomatic.  Stopped zoloft  Since she underwent pericardiocentesis and almost 900 ml fluid drained Nephrology on board. F/u sodium levels daily     Hypothyroidism:  Resume synthroid . Abnormal thyroid  panel  with slightly elevated TSH and elevated free t4.  Recheck thyroid  panel in 4 weeks.      Type 2 DM with hyperglycemia:  Continue monitoring and SSI     Hyperlipidemia Resumed pravastatin  20 mg daily.      Bipolar disorder Resumed Zoloft      Normocytic anemia Baseline hemoglobin appears to be around 10.     Vitamin b12 deficiency/ Iron deficiency anemia Replacements ordered.      DVT prophylaxis: SCD's Start: 12/09/23 1559 SCDs Start: 12/08/23 2335     Code Status: Full Code Family Communication:  Husband at the bedside Status is: Inpatient Remains inpatient appropriate because: Afib with RVR, s/p pericardiocentesis    Subjective:  No acute events overnight. She is on amiodarone  drip and HR is on the tachycardic side. Husband is present at the bedside.  Examination:  General exam: Appears fatigued Respiratory system: Clear to auscultation. Respiratory effort normal. Cardiovascular system: Irregularly irregular rhythm, tachycardia,No pedal edema. Gastrointestinal system: Abdomen is nondistended, soft and nontender. No organomegaly or masses felt. Normal  bowel sounds heard. Central nervous system: Alert and oriented. No focal neurological deficits. Extremities: Symmetric 5 x 5 power. Skin: No rashes, lesions or ulcers Psychiatry: Judgement and insight appear normal. Mood & affect  appropriate.     Diet Orders (From admission, onward)     Start     Ordered   12/09/23 1559  Diet Heart Room service appropriate? Yes; Fluid consistency: Thin  Diet effective now       Question Answer Comment  Room service appropriate? Yes   Fluid consistency: Thin      12/09/23 1558            Objective: Vitals:   12/15/23 0029 12/15/23 0329 12/15/23 0730 12/15/23 1203  BP: 124/88 (!) 122/91 (!) 158/111 (!) 148/92  Pulse: (!) 121 (!) 132 (!) 135   Resp: 20 20 20 20   Temp: 97.6 F (36.4 C) 97.9 F (36.6 C) 98 F (36.7 C) 97.8 F (36.6 C)  TempSrc: Oral Oral Oral Oral  SpO2: 98% 98% 97% 95%  Weight:      Height:        Intake/Output Summary (Last 24 hours) at 12/15/2023 1229 Last data filed at 12/15/2023 1019 Gross per 24 hour  Intake 476.58 ml  Output 450 ml  Net 26.58 ml   Filed Weights   12/08/23 1543 12/08/23 2303 12/13/23 0600  Weight: 97.1 kg 94.8 kg 97.4 kg    Scheduled Meds:  allopurinol   300 mg Oral Daily   benzonatate   200 mg Oral BID   colchicine   0.6 mg Oral BID   docusate sodium   100 mg Oral BID   doxepin   10 mg Oral Daily   ferrous sulfate   325 mg Oral Q breakfast   ibuprofen   600 mg Oral TID   insulin  aspart  0-15 Units Subcutaneous TID WC   insulin  glargine  15 Units Subcutaneous Q24H   lamoTRIgine   200 mg Oral Q1200   levothyroxine   50 mcg Oral Daily   lisinopril   5 mg Oral Daily   oxybutynin   10 mg Oral QHS   pantoprazole   40 mg Oral BID   pravastatin   20 mg Oral QHS   sodium chloride  flush  3 mL Intravenous Q12H   sodium chloride   1 g Oral BID WC   vitamin B-12  500 mcg Oral Daily   Continuous Infusions:  amiodarone  30 mg/hr (12/15/23 1018)    Nutritional status     Body mass index is 39.92 kg/m.  Data Reviewed:   CBC: Recent Labs  Lab 12/10/23 0312 12/11/23 0327 12/12/23 0333 12/13/23 0142 12/14/23 0222  WBC 16.8* 15.0* 15.2* 14.5* 12.4*  NEUTROABS 12.1* 10.9*  --  10.3* 8.0*  HGB 9.8* 9.8* 9.0* 8.8* 8.1*   HCT 29.9* 28.9* 26.9* 26.0* 24.5*  MCV 85.2 83.5 83.0 82.8 83.1  PLT 458* 483* 569* 617* 592*   Basic Metabolic Panel: Recent Labs  Lab 12/10/23 0312 12/11/23 0327 12/12/23 0333 12/12/23 2041 12/13/23 0142 12/13/23 1523 12/14/23 0221 12/14/23 0222 12/15/23 0441  NA 130* 129* 127* 127* 125* 131*  --  130* 133*  K 4.0 4.1 3.8  --  4.0  --   --  3.5 4.3  CL 97* 96* 94*  --  92*  --   --  96* 97*  CO2 23 22 22   --  22  --   --  22 22  GLUCOSE 178* 158* 166*  --  246*  --   --  156* 145*  BUN  9 9 7*  --  9  --   --  10 9  CREATININE 0.74 0.88 0.61  --  0.84  --   --  0.70 0.68  CALCIUM  8.1* 8.6* 8.1*  --  8.1*  --   --  8.2* 8.5*  MG 1.7  --   --   --   --   --  1.5*  --   --   PHOS  --   --   --   --   --   --   --  4.0  --    GFR: Estimated Creatinine Clearance: 67.6 mL/min (by C-G formula based on SCr of 0.68 mg/dL). Liver Function Tests: Recent Labs  Lab 12/08/23 1700 12/09/23 0431 12/14/23 0222  AST 19 14*  --   ALT 12 11  --   ALKPHOS 105 78  --   BILITOT 0.4 0.5  --   PROT 7.1 6.1*  --   ALBUMIN  3.8 2.8* 2.2*   No results for input(s): LIPASE, AMYLASE in the last 168 hours. No results for input(s): AMMONIA in the last 168 hours. Coagulation Profile: No results for input(s): INR, PROTIME in the last 168 hours. Cardiac Enzymes: No results for input(s): CKTOTAL, CKMB, CKMBINDEX, TROPONINI in the last 168 hours. BNP (last 3 results) Recent Labs    12/08/23 1700  PROBNP 725.0*   HbA1C: No results for input(s): HGBA1C in the last 72 hours. CBG: Recent Labs  Lab 12/14/23 1150 12/14/23 1652 12/14/23 2133 12/15/23 0807 12/15/23 1206  GLUCAP 112* 132* 141* 150* 191*   Lipid Profile: No results for input(s): CHOL, HDL, LDLCALC, TRIG, CHOLHDL, LDLDIRECT in the last 72 hours. Thyroid  Function Tests: No results for input(s): TSH, T4TOTAL, FREET4, T3FREE, THYROIDAB in the last 72 hours. Anemia Panel: No results for  input(s): VITAMINB12, FOLATE, FERRITIN, TIBC, IRON, RETICCTPCT in the last 72 hours. Sepsis Labs: Recent Labs  Lab 12/11/23 0327 12/14/23 0221  PROCALCITON <0.10 <0.10    Recent Results (from the past 240 hours)  Surgical pcr screen     Status: None   Collection Time: 12/08/23  2:54 PM   Specimen: Nasal Mucosa; Nasal Swab  Result Value Ref Range Status   MRSA, PCR NEGATIVE NEGATIVE Final   Staphylococcus aureus NEGATIVE NEGATIVE Final    Comment: (NOTE) The Xpert SA Assay (FDA approved for NASAL specimens in patients 14 years of age and older), is one component of a comprehensive surveillance program. It is not intended to diagnose infection nor to guide or monitor treatment. Performed at [facility], [address] [place], [place] [address]   Resp panel by RT-PCR (RSV, Flu A&B, Covid) Anterior Nasal Swab     Status: None   Collection Time: 12/08/23  4:53 PM   Specimen: Anterior Nasal Swab  Result Value Ref Range Status   SARS Coronavirus 2 by RT PCR NEGATIVE NEGATIVE Final    Comment: (NOTE) SARS-CoV-2 target nucleic acids are NOT DETECTED.  The SARS-CoV-2 RNA is generally detectable in upper respiratory specimens during the acute phase of infection. The lowest concentration of SARS-CoV-2 viral copies this assay can detect is 138 copies/mL. A negative result does not preclude SARS-Cov-2 infection and should not be used as the sole basis for treatment or other patient management decisions. A negative result may occur with  improper specimen collection/handling, submission of specimen other than nasopharyngeal swab, presence of viral mutation(s) within the areas targeted by this assay, and inadequate number of viral  copies(<138 copies/mL). A negative result must be combined with clinical observations, patient history, and epidemiological information. The expected result is Negative.  Fact Sheet for Patients:   BloggerCourse.com  Fact Sheet for Healthcare Providers:  SeriousBroker.it  This test is no t yet approved or cleared by the [place]  FDA and  has been authorized for detection and/or diagnosis of SARS-CoV-2 by FDA under an Emergency Use Authorization (EUA). This EUA will remain  in effect (meaning this test can be used) for the duration of the COVID-19 declaration under Section 564(b)(1) of the Act, 21 U.S.C.section 360bbb-3(b)(1), unless the authorization is terminated  or revoked sooner.       Influenza A by PCR NEGATIVE NEGATIVE Final   Influenza B by PCR NEGATIVE NEGATIVE Final    Comment: (NOTE) The Xpert Xpress SARS-CoV-2/FLU/RSV plus assay is intended as an aid in the diagnosis of influenza from Nasopharyngeal swab specimens and should not be used as a sole basis for treatment. Nasal washings and aspirates are unacceptable for Xpert Xpress SARS-CoV-2/FLU/RSV testing.  Fact Sheet for Patients: BloggerCourse.com  Fact Sheet for Healthcare Providers: SeriousBroker.it  This test is not yet approved or cleared by the [place]  FDA and has been authorized for detection and/or diagnosis of SARS-CoV-2 by FDA under an Emergency Use Authorization (EUA). This EUA will remain in effect (meaning this test can be used) for the duration of the COVID-19 declaration under Section 564(b)(1) of the Act, 21 U.S.C. section 360bbb-3(b)(1), unless the authorization is terminated or revoked.     Resp Syncytial Virus by PCR NEGATIVE NEGATIVE Final    Comment: (NOTE) Fact Sheet for Patients: BloggerCourse.com  Fact Sheet for Healthcare Providers: SeriousBroker.it  This test is not yet approved or cleared by the [place]  FDA and has been authorized for detection and/or diagnosis of SARS-CoV-2 by FDA under an Emergency Use  Authorization (EUA). This EUA will remain in effect (meaning this test can be used) for the duration of the COVID-19 declaration under Section 564(b)(1) of the Act, 21 U.S.C. section 360bbb-3(b)(1), unless the authorization is terminated or revoked.  Performed at [facility], [address] [place], [place] [address]   Body fluid culture w Gram Stain     Status: None   Collection Time: 12/09/23  3:04 PM   Specimen: PATH Cytology Misc. fluid; Body Fluid  Result Value Ref Range Status   Specimen Description PERICARDIAL  Final   Special Requests NONE  Final   Gram Stain   Final    FEW WBC PRESENT, PREDOMINANTLY MONONUCLEAR NO ORGANISMS SEEN    Culture   Final    NO GROWTH 3 DAYS Performed at [facility], [address], [place], [place] [address]    Report Status 12/12/2023 FINAL  Final  Respiratory (~20 pathogens) panel by PCR     Status: None   Collection Time: 12/11/23  4:02 PM   Specimen: Nasopharyngeal Swab; Respiratory  Result Value Ref Range Status   Adenovirus NOT DETECTED NOT DETECTED Final   Coronavirus 229E NOT DETECTED NOT DETECTED Final    Comment: (NOTE) The Coronavirus on the Respiratory Panel, DOES NOT test for the novel  Coronavirus (2019 nCoV)    Coronavirus HKU1 NOT DETECTED NOT DETECTED Final   Coronavirus NL63 NOT DETECTED NOT DETECTED Final   Coronavirus OC43 NOT DETECTED NOT DETECTED Final   Metapneumovirus NOT DETECTED NOT DETECTED Final   Rhinovirus / Enterovirus NOT DETECTED NOT DETECTED Final   Influenza A NOT DETECTED NOT DETECTED  Final   Influenza B NOT DETECTED NOT DETECTED Final   Parainfluenza Virus 1 NOT DETECTED NOT DETECTED Final   Parainfluenza Virus 2 NOT DETECTED NOT DETECTED Final   Parainfluenza Virus 3 NOT DETECTED NOT DETECTED Final   Parainfluenza Virus 4 NOT DETECTED NOT DETECTED Final   Respiratory Syncytial Virus NOT DETECTED NOT DETECTED Final   Bordetella pertussis NOT DETECTED NOT DETECTED Final    Bordetella Parapertussis NOT DETECTED NOT DETECTED Final   Chlamydophila pneumoniae NOT DETECTED NOT DETECTED Final   Mycoplasma pneumoniae NOT DETECTED NOT DETECTED Final    Comment: Performed at [facility], [address], [place], [place] [address]         Radiology Studies: DG Chest Port 1 View Result Date: 12/13/2023 CLINICAL DATA:  Wheezing EXAM: PORTABLE CHEST 1 VIEW COMPARISON:  12/11/2023 FINDINGS: Single frontal view of the chest demonstrates stable enlargement of the cardiac silhouette. No acute airspace disease, effusion, or pneumothorax. No acute bony abnormalities. IMPRESSION: 1. Stable chest, no acute process. Electronically Signed   By: Ozell Daring M.D.   On: 12/13/2023 23:59         LOS: 7 days   Time spent= 43 mins    Deliliah Room, MD [facility]  If 7PM-7AM, please contact night-coverage  12/15/2023, 12:29 PM

## 2023-12-16 DIAGNOSIS — I4892 Unspecified atrial flutter: Secondary | ICD-10-CM | POA: Diagnosis not present

## 2023-12-16 DIAGNOSIS — I300 Acute nonspecific idiopathic pericarditis: Secondary | ICD-10-CM | POA: Diagnosis not present

## 2023-12-16 DIAGNOSIS — I3139 Other pericardial effusion (noninflammatory): Secondary | ICD-10-CM | POA: Diagnosis not present

## 2023-12-16 LAB — BASIC METABOLIC PANEL WITH GFR
Anion gap: 13 (ref 5–15)
BUN: 8 mg/dL (ref 8–23)
CO2: 22 mmol/L (ref 22–32)
Calcium: 8.5 mg/dL — ABNORMAL LOW (ref 8.9–10.3)
Chloride: 98 mmol/L (ref 98–111)
Creatinine, Ser: 0.89 mg/dL (ref 0.44–1.00)
GFR, Estimated: >60 mL/min (ref >60)
Glucose, Bld: 122 mg/dL — ABNORMAL HIGH (ref 70–99)
Potassium: 4.2 mmol/L (ref 3.5–5.1)
Sodium: 133 mmol/L — ABNORMAL LOW (ref 135–145)

## 2023-12-16 LAB — CBC WITH DIFFERENTIAL/PLATELET
Abs Immature Granulocytes: 0.33 K/uL — ABNORMAL HIGH (ref 0.00–0.07)
Basophils Absolute: 0.1 K/uL (ref 0.0–0.1)
Basophils Relative: 1 %
Eosinophils Absolute: 0.9 K/uL — ABNORMAL HIGH (ref 0.0–0.5)
Eosinophils Relative: 7 %
HCT: 27 % — ABNORMAL LOW (ref 36.0–46.0)
Hemoglobin: 8.9 g/dL — ABNORMAL LOW (ref 12.0–15.0)
Immature Granulocytes: 3 %
Lymphocytes Relative: 22 %
Lymphs Abs: 3 K/uL (ref 0.7–4.0)
MCH: 27.6 pg (ref 26.0–34.0)
MCHC: 33 g/dL (ref 30.0–36.0)
MCV: 83.9 fL (ref 80.0–100.0)
Monocytes Absolute: 1.3 K/uL — ABNORMAL HIGH (ref 0.1–1.0)
Monocytes Relative: 10 %
Neutro Abs: 7.8 K/uL — ABNORMAL HIGH (ref 1.7–7.7)
Neutrophils Relative %: 57 %
Platelets: 838 K/uL — ABNORMAL HIGH (ref 150–400)
RBC: 3.22 MIL/uL — ABNORMAL LOW (ref 3.87–5.11)
RDW: 14.9 % (ref 11.5–15.5)
WBC: 13.3 K/uL — ABNORMAL HIGH (ref 4.0–10.5)
nRBC: 0 % (ref 0.0–0.2)

## 2023-12-16 LAB — GLUCOSE, CAPILLARY
Glucose-Capillary: 125 mg/dL — ABNORMAL HIGH (ref 70–99)
Glucose-Capillary: 209 mg/dL — ABNORMAL HIGH (ref 70–99)
Glucose-Capillary: 104 mg/dL — ABNORMAL HIGH (ref 70–99)
Glucose-Capillary: 108 mg/dL — ABNORMAL HIGH (ref 70–99)

## 2023-12-16 MED ORDER — AMIODARONE HCL 200 MG PO TABS
200.0000 mg | ORAL_TABLET | Freq: Every day | ORAL | Status: DC
  Administered 2023-12-23 – 2024-01-02 (×10): 200 mg via ORAL
  Filled 2023-12-16 (×10): qty 1

## 2023-12-16 MED ORDER — AMIODARONE HCL 200 MG PO TABS
200.0000 mg | ORAL_TABLET | Freq: Two times a day (BID) | ORAL | Status: AC
  Administered 2023-12-16 – 2023-12-22 (×13): 200 mg via ORAL
  Filled 2023-12-16 (×14): qty 1

## 2023-12-16 MED ORDER — METOPROLOL TARTRATE 25 MG PO TABS
25.0000 mg | ORAL_TABLET | Freq: Two times a day (BID) | ORAL | Status: DC
  Administered 2023-12-16 – 2024-01-02 (×30): 25 mg via ORAL
  Filled 2023-12-16 (×33): qty 1

## 2023-12-16 MED ORDER — METOPROLOL TARTRATE 25 MG PO TABS
25.0000 mg | ORAL_TABLET | Freq: Two times a day (BID) | ORAL | Status: DC
  Filled 2023-12-16: qty 1

## 2023-12-16 NOTE — TOC Progression Note (Signed)
 Transition of Care [facility]) - Progression Note    Patient Details  Name: Brittany Acosta MRN: [id number] Date of Birth: [birth year]  [facility] CM/SW Contact  Graves-Bigelow, Erminio Deems, RN Phone Number: 12/16/2023, 4:01 PM  Clinical Narrative: Patient was seen by physical therapy and the recommendations are for home with home health. Patient has received the rollator from [employer] and the spouse has taken it home. Patient is agreeable to home health services- wants an agency in network. Inpatient Case Manager submitted the referral to [facility] which is in network with the insurance. Start of care to begin within 24-48 hours post transition home. Patient will need HH PT/OT orders and F2F. Spouse will be able to transport home via private vehicle once stable. No further needs identified at this time.     Expected Discharge Plan: Home w Home Health Services Barriers to Discharge: No Barriers Identified  Expected Discharge Plan and Services In-house Referral: NA Discharge Planning Services: CM Consult Post Acute Care Choice: Home Health Living arrangements for the past 2 months: Single Family Home                 DME Arranged: Walker rolling with seat DME Agency: [facility] Date DME Agency Contacted: 12/13/23 Time DME Agency Contacted: [phone] Representative spoke with at DME Agency: Ryan HH Arranged: PT, OT [facility] Agency: [facility] Date [facility] Agency Contacted: 12/16/23 Time HH Agency Contacted: 1600 Representative spoke with at [facility] Agency: Amy   Social Drivers of Health (SDOH) Interventions SDOH Screenings   Food Insecurity: No Food Insecurity (12/08/2023)  Housing: Low Risk  (12/08/2023)  Transportation Needs: No Transportation Needs (12/08/2023)  Utilities: Not At Risk (12/08/2023)  Depression (PHQ2-9): Low Risk  (11/22/2023)  Financial Resource Strain: Low Risk  (09/05/2019)   Received from [facility]  Physical Activity: Unknown (09/05/2019)   Received from  [facility]  Social Connections: Moderately Isolated (12/08/2023)  Stress: No Stress Concern Present (09/05/2019)   [facility] from [facility]  Tobacco Use: Medium Risk (12/09/2023)    Readmission Risk Interventions     No data to display

## 2023-12-16 NOTE — Progress Notes (Signed)
 Physical Therapy Treatment Patient Details Name: Brittany Acosta MRN: [id number] DOB: 09-07-1950 Today's Date: 12/16/2023   History of Present Illness Patient is a 73 y/o female admitted 12/08/23 due to SOB and CP.  Found to have moderate to large pericardial effusion, underwent pericardiocentesis with drain placement 12/09/23.  PMH positive for DM, HTN, psoriatic arthropathy, fibromyalgia, h/o gout, h/o breast CA, HLD.    PT Comments  Pt is a little deconditioned from days of relative inactivity.  Expect pt to make good progress, but today was staggery and needed the RW.  Emphasis on transitions, sitting/standing balance and progression of gait with the IV pole, then the RW for stability.  Due to days of inactivity, pt could now use some follow up PT at home.      If plan is discharge home, recommend the following: A little help with walking and/or transfers;Assist for transportation;A little help with bathing/dressing/bathroom   Can travel by private vehicle        Equipment Recommendations  Rollator (4 wheels)    Recommendations for Other Services       Precautions / Restrictions Precautions Precautions: Fall Recall of Precautions/Restrictions: Intact     Mobility  Bed Mobility Overal bed mobility: Needs Assistance Bed Mobility: Supine to Sit, Sit to Supine       Sit to supine: Contact guard assist   General bed mobility comments: mild struggle, used the rail which she does not have at home    Transfers Overall transfer level: Needs assistance Equipment used: Ambulation equipment used, 1 person hand held assist Transfers: Sit to/from Stand Sit to Stand: Min assist           General transfer comment: light minimal stability assist    Ambulation/Gait Ambulation/Gait assistance: Min assist Gait Distance (Feet): 120 Feet Assistive device: IV Pole Gait Pattern/deviations: Step-through pattern, Decreased stride length Gait velocity: slower Gait velocity  interpretation: <1.31 ft/sec, indicative of household ambulator   General Gait Details: overall unsteady with mild stagger, drift and 1 big deviation trying to untangle from the lines with mod to recover.   Stairs             Wheelchair Mobility     Tilt Bed    Modified Rankin (Stroke Patients Only)       Balance Overall balance assessment: Needs assistance Sitting-balance support: Feet supported Sitting balance-Leahy Scale: Good     Standing balance support: Single extremity supported, No upper extremity supported, During functional activity Standing balance-Leahy Scale: Fair (to poor, likely from days of inactivity.) Standing balance comment: periods of instability static standing at EOB                            Communication Communication Communication: No apparent difficulties  Cognition Arousal: Alert Behavior During Therapy: WFL for tasks assessed/performed   PT - Cognitive impairments: No apparent impairments                         Following commands: Intact      Cueing    Exercises      General Comments        Pertinent Vitals/Pain Pain Assessment Pain Assessment: Faces Faces Pain Scale: No hurt Pain Intervention(s): Monitored during session    Home Living                          Prior Function  PT Goals (current goals can now be found in the care plan section) Acute Rehab PT Goals PT Goal Formulation: With patient/family Time For Goal Achievement: 12/26/23 Potential to Achieve Goals: Good Progress towards PT goals: Progressing toward goals    Frequency    Min 3X/week      PT Plan      Co-evaluation              AM-PAC PT 6 Clicks Mobility   Outcome Measure  Help needed turning from your back to your side while in a flat bed without using bedrails?: A Little Help needed moving from lying on your back to sitting on the side of a flat bed without using bedrails?: A  Little Help needed moving to and from a bed to a chair (including a wheelchair)?: A Little Help needed standing up from a chair using your arms (e.g., wheelchair or bedside chair)?: A Little Help needed to walk in hospital room?: A Little Help needed climbing 3-5 steps with a railing? : A Little 6 Click Score: 18    End of Session   Activity Tolerance: Patient tolerated treatment well Patient left: in bed;with call bell/phone within reach Nurse Communication: Mobility status PT Visit Diagnosis: Other abnormalities of gait and mobility (R26.89)     Time: 8949-8887 PT Time Calculation (min) (ACUTE ONLY): 22 min  Charges:    $Gait Training: 8-22 mins PT General Charges $$ ACUTE PT VISIT: 1 Visit                     12/16/2023  India HERO., PT Acute Rehabilitation Services [phone]  (office)   Brittany Acosta 12/16/2023, 11:22 AM

## 2023-12-16 NOTE — Progress Notes (Signed)
 PROGRESS NOTE    Brittany Acosta  FMW:[id number] DOB: [birth year] DOA: 12/08/2023 PCP: [name], MD   Brief Narrative:    73 y.o. female with a past medical history of diabetes mellitus type 2 on insulin , essential hypertension, Psoriatec arthropathy, history of gout, history of breast cancer, hyperlipidemia who was in her usual state of health till about 2 to 3 days ago when she started developing shortness of breath along with chest pain.  CT scan chest  which raise concern for moderate to large pericardial effusion. Case was discussed with cardiology who recommended hospitalization for further evaluation.  She was taken for OR for pericardiocentesis. She went into Afib with RVR on the night of 9/9, started on amiodarone  drip on 9/10. Cardiology on board. Converted to sinus rhythm now.  Assessment & Plan:  Principal Problem:   Pericardial effusion Active Problems:   Hypothyroidism   Hypertension associated with diabetes (HCC)   Poorly controlled type 2 diabetes mellitus with circulatory disorder (HCC)   Gout, tophaceous   Psoriatic arthropathy (HCC)   Hyponatremia   Atrial flutter (HCC)   Acute idiopathic pericarditis    Afib/flutter with RVR: Developed on the night of 9/9. Started on amiodarone  drip after amio bolus on 9/10. Cardiology on board Continue tele monitoring. Converted to sinus rhythm.  Pericardial effusion:  Unclear etiology, differential include uremic etiology, vs viral infection.  ESR is 55, CRP is 23 Echocardiogram showed moderate effusion, with JVD and symptomatic with sob and intermittent chest pain.  S/p pericardiocentesis with 9/5 with out. Pericardial fluid sent for analysis, it was bloody in appearance. Gram stain is negative and cultures are pending.  Cytology was sent in view of her  history of breast cancer and is negative for malignancy. She was empirically started on colchicine  0.6 mg BID along with ibuprofen  600 mg TID.  Repeat echo shows  resolution of the pericardial effusion, drain removed by cardiology on 9/8.      Hypokalemia Replaced.     Hyponatremia; secondary to SIADH/medications/Hypothyroidism. Sodium is 133 today.  TSH slightly high, free t4 elevated. Urine sodium is less than 30, and urine osmolality is 187. Serum osmo is 271. Am cortisol is 8.8  Suspect from medications , she remains asymptomatic.  Stopped zoloft  Since she underwent pericardiocentesis and almost 900 ml fluid drained Nephrology on board. F/u sodium levels daily     Hypothyroidism:  Resume synthroid . Abnormal thyroid  panel  with slightly elevated TSH and elevated free t4.  Recheck thyroid  panel in 4 weeks.      Type 2 DM with hyperglycemia:  Continue monitoring and SSI     Hyperlipidemia Resumed pravastatin  20 mg daily.      Bipolar disorder Resumed Zoloft      Normocytic anemia Baseline hemoglobin appears to be around 10.     Vitamin b12 deficiency/ Iron deficiency anemia Replacements ordered.      DVT prophylaxis: SCD's Start: 12/09/23 1559 SCDs Start: 12/08/23 2335     Code Status: Full Code Family Communication:  None at the bedside Status is: Inpatient Remains inpatient appropriate because: Afib with RVR, s/p pericardiocentesis    Subjective:  No acute events overnight. Feels much better. Converted back to sinus rhythm. She was sitting in the chair this morning.  Examination:  General exam: Appears calm and comfortable Respiratory system: Clear to auscultation. Respiratory effort normal. Cardiovascular system:sinus rhythm, normal heart rate,No pedal edema. Gastrointestinal system: Abdomen is nondistended, soft and nontender. No organomegaly or masses felt. Normal bowel  sounds heard. Central nervous system: Alert and oriented. No focal neurological deficits. Extremities: Symmetric 5 x 5 power. Skin: No rashes, lesions or ulcers Psychiatry: Judgement and insight appear normal. Mood & affect  appropriate.     Diet Orders (From admission, onward)     Start     Ordered   12/09/23 1559  Diet Heart Room service appropriate? Yes; Fluid consistency: Thin  Diet effective now       Question Answer Comment  Room service appropriate? Yes   Fluid consistency: Thin      12/09/23 1558            Objective: Vitals:   12/15/23 2010 12/15/23 2341 12/16/23 0116 12/16/23 0745  BP: 118/73 (!) 140/71 133/72 (!) 111/49  Pulse: (!) 134 77 75 64  Resp:  20    Temp:  98.7 F (37.1 C)    TempSrc:  Oral    SpO2:  99%  99%  Weight:      Height:        Intake/Output Summary (Last 24 hours) at 12/16/2023 1020 Last data filed at 12/16/2023 0937 Gross per 24 hour  Intake 597 ml  Output 700 ml  Net -103 ml   Filed Weights   12/08/23 1543 12/08/23 2303 12/13/23 0600  Weight: 97.1 kg 94.8 kg 97.4 kg    Scheduled Meds:  allopurinol   300 mg Oral Daily   benzonatate   200 mg Oral BID   colchicine   0.6 mg Oral BID   docusate sodium   100 mg Oral BID   doxepin   10 mg Oral Daily   ferrous sulfate   325 mg Oral Q breakfast   ibuprofen   600 mg Oral TID   insulin  aspart  0-15 Units Subcutaneous TID WC   insulin  glargine  15 Units Subcutaneous Q24H   lamoTRIgine   200 mg Oral Q1200   levothyroxine   50 mcg Oral Daily   lisinopril   5 mg Oral Daily   metoprolol  tartrate  12.5 mg Oral Q6H   oxybutynin   10 mg Oral QHS   pantoprazole   40 mg Oral BID   pravastatin   20 mg Oral QHS   sodium chloride  flush  3 mL Intravenous Q12H   sodium chloride   1 g Oral BID WC   vitamin B-12  500 mcg Oral Daily   Continuous Infusions:  amiodarone  30 mg/hr (12/16/23 0115)    Nutritional status     Body mass index is 39.92 kg/m.  Data Reviewed:   CBC: Recent Labs  Lab 12/10/23 0312 12/11/23 0327 12/12/23 0333 12/13/23 0142 12/14/23 0222 12/16/23 0438  WBC 16.8* 15.0* 15.2* 14.5* 12.4* 13.3*  NEUTROABS 12.1* 10.9*  --  10.3* 8.0* 7.8*  HGB 9.8* 9.8* 9.0* 8.8* 8.1* 8.9*  HCT 29.9* 28.9*  26.9* 26.0* 24.5* 27.0*  MCV 85.2 83.5 83.0 82.8 83.1 83.9  PLT 458* 483* 569* 617* 592* 838*   Basic Metabolic Panel: Recent Labs  Lab 12/10/23 0312 12/11/23 0327 12/12/23 0333 12/12/23 2041 12/13/23 0142 12/13/23 1523 12/14/23 0221 12/14/23 0222 12/15/23 0441 12/16/23 0438  NA 130*   < > 127*   < > 125* 131*  --  130* 133* 133*  K 4.0   < > 3.8  --  4.0  --   --  3.5 4.3 4.2  CL 97*   < > 94*  --  92*  --   --  96* 97* 98  CO2 23   < > 22  --  22  --   --  22 22 22   GLUCOSE 178*   < > 166*  --  246*  --   --  156* 145* 122*  BUN 9   < > 7*  --  9  --   --  10 9 8   CREATININE 0.74   < > 0.61  --  0.84  --   --  0.70 0.68 0.89  CALCIUM  8.1*   < > 8.1*  --  8.1*  --   --  8.2* 8.5* 8.5*  MG 1.7  --   --   --   --   --  1.5*  --   --   --   PHOS  --   --   --   --   --   --   --  4.0  --   --    < > = values in this interval not displayed.   GFR: Estimated Creatinine Clearance: 60.8 mL/min (by C-G formula based on SCr of 0.89 mg/dL). Liver Function Tests: Recent Labs  Lab 12/14/23 0222  ALBUMIN  2.2*   No results for input(s): LIPASE, AMYLASE in the last 168 hours. No results for input(s): AMMONIA in the last 168 hours. Coagulation Profile: No results for input(s): INR, PROTIME in the last 168 hours. Cardiac Enzymes: No results for input(s): CKTOTAL, CKMB, CKMBINDEX, TROPONINI in the last 168 hours. BNP (last 3 results) Recent Labs    12/08/23 1700  PROBNP 725.0*   HbA1C: No results for input(s): HGBA1C in the last 72 hours. CBG: Recent Labs  Lab 12/15/23 0807 12/15/23 1206 12/15/23 1620 12/15/23 2112 12/16/23 0804  GLUCAP 150* 191* 161* 105* 125*   Lipid Profile: No results for input(s): CHOL, HDL, LDLCALC, TRIG, CHOLHDL, LDLDIRECT in the last 72 hours. Thyroid  Function Tests: No results for input(s): TSH, T4TOTAL, FREET4, T3FREE, THYROIDAB in the last 72 hours. Anemia Panel: No results for input(s):  VITAMINB12, FOLATE, FERRITIN, TIBC, IRON, RETICCTPCT in the last 72 hours. Sepsis Labs: Recent Labs  Lab 12/11/23 0327 12/14/23 0221  PROCALCITON <0.10 <0.10    Recent Results (from the past 240 hours)  Surgical pcr screen     Status: None   Collection Time: 12/08/23  2:54 PM   Specimen: Nasal Mucosa; Nasal Swab  Result Value Ref Range Status   MRSA, PCR NEGATIVE NEGATIVE Final   Staphylococcus aureus NEGATIVE NEGATIVE Final    Comment: (NOTE) The Xpert SA Assay (FDA approved for NASAL specimens in patients 26 years of age and older), is one component of a comprehensive surveillance program. It is not intended to diagnose infection nor to guide or monitor treatment. Performed at [facility], [address], [place], [place] [address]   Resp panel by RT-PCR (RSV, Flu A&B, Covid) Anterior Nasal Swab     Status: None   Collection Time: 12/08/23  4:53 PM   Specimen: Anterior Nasal Swab  Result Value Ref Range Status   SARS Coronavirus 2 by RT PCR NEGATIVE NEGATIVE Final    Comment: (NOTE) SARS-CoV-2 target nucleic acids are NOT DETECTED.  The SARS-CoV-2 RNA is generally detectable in upper respiratory specimens during the acute phase of infection. The lowest concentration of SARS-CoV-2 viral copies this assay can detect is 138 copies/mL. A negative result does not preclude SARS-Cov-2 infection and should not be used as the sole basis for treatment or other patient management decisions. A negative result may occur with  improper specimen collection/handling, submission of specimen other than nasopharyngeal swab, presence of  viral mutation(s) within the areas targeted by this assay, and inadequate number of viral copies(<138 copies/mL). A negative result must be combined with clinical observations, patient history, and epidemiological information. The expected result is Negative.  Fact Sheet for Patients:   BloggerCourse.com  Fact Sheet for Healthcare Providers:  SeriousBroker.it  This test is no t yet approved or cleared by the [place]  FDA and  has been authorized for detection and/or diagnosis of SARS-CoV-2 by FDA under an Emergency Use Authorization (EUA). This EUA will remain  in effect (meaning this test can be used) for the duration of the COVID-19 declaration under Section 564(b)(1) of the Act, 21 U.S.C.section 360bbb-3(b)(1), unless the authorization is terminated  or revoked sooner.       Influenza A by PCR NEGATIVE NEGATIVE Final   Influenza B by PCR NEGATIVE NEGATIVE Final    Comment: (NOTE) The Xpert Xpress SARS-CoV-2/FLU/RSV plus assay is intended as an aid in the diagnosis of influenza from Nasopharyngeal swab specimens and should not be used as a sole basis for treatment. Nasal washings and aspirates are unacceptable for Xpert Xpress SARS-CoV-2/FLU/RSV testing.  Fact Sheet for Patients: BloggerCourse.com  Fact Sheet for Healthcare Providers: SeriousBroker.it  This test is not yet approved or cleared by the [place]  FDA and has been authorized for detection and/or diagnosis of SARS-CoV-2 by FDA under an Emergency Use Authorization (EUA). This EUA will remain in effect (meaning this test can be used) for the duration of the COVID-19 declaration under Section 564(b)(1) of the Act, 21 U.S.C. section 360bbb-3(b)(1), unless the authorization is terminated or revoked.     Resp Syncytial Virus by PCR NEGATIVE NEGATIVE Final    Comment: (NOTE) Fact Sheet for Patients: BloggerCourse.com  Fact Sheet for Healthcare Providers: SeriousBroker.it  This test is not yet approved or cleared by the [place]  FDA and has been authorized for detection and/or diagnosis of SARS-CoV-2 by FDA under an Emergency Use  Authorization (EUA). This EUA will remain in effect (meaning this test can be used) for the duration of the COVID-19 declaration under Section 564(b)(1) of the Act, 21 U.S.C. section 360bbb-3(b)(1), unless the authorization is terminated or revoked.  Performed at [facility], [address] [place], [place] [address]   Body fluid culture w Gram Stain     Status: None   Collection Time: 12/09/23  3:04 PM   Specimen: PATH Cytology Misc. fluid; Body Fluid  Result Value Ref Range Status   Specimen Description PERICARDIAL  Final   Special Requests NONE  Final   Gram Stain   Final    FEW WBC PRESENT, PREDOMINANTLY MONONUCLEAR NO ORGANISMS SEEN    Culture   Final    NO GROWTH 3 DAYS Performed at [facility], [address], [place], [place] [address]    Report Status 12/12/2023 FINAL  Final  Respiratory (~20 pathogens) panel by PCR     Status: None   Collection Time: 12/11/23  4:02 PM   Specimen: Nasopharyngeal Swab; Respiratory  Result Value Ref Range Status   Adenovirus NOT DETECTED NOT DETECTED Final   Coronavirus 229E NOT DETECTED NOT DETECTED Final    Comment: (NOTE) The Coronavirus on the Respiratory Panel, DOES NOT test for the novel  Coronavirus (2019 nCoV)    Coronavirus HKU1 NOT DETECTED NOT DETECTED Final   Coronavirus NL63 NOT DETECTED NOT DETECTED Final   Coronavirus OC43 NOT DETECTED NOT DETECTED Final   Metapneumovirus NOT DETECTED NOT DETECTED Final   Rhinovirus /  Enterovirus NOT DETECTED NOT DETECTED Final   Influenza A NOT DETECTED NOT DETECTED Final   Influenza B NOT DETECTED NOT DETECTED Final   Parainfluenza Virus 1 NOT DETECTED NOT DETECTED Final   Parainfluenza Virus 2 NOT DETECTED NOT DETECTED Final   Parainfluenza Virus 3 NOT DETECTED NOT DETECTED Final   Parainfluenza Virus 4 NOT DETECTED NOT DETECTED Final   Respiratory Syncytial Virus NOT DETECTED NOT DETECTED Final   Bordetella pertussis NOT DETECTED NOT DETECTED Final    Bordetella Parapertussis NOT DETECTED NOT DETECTED Final   Chlamydophila pneumoniae NOT DETECTED NOT DETECTED Final   Mycoplasma pneumoniae NOT DETECTED NOT DETECTED Final    Comment: Performed at [facility], [address], [place], [place] [address]         Radiology Studies: No results found.        LOS: 8 days   Time spent= 42 mins    Deliliah Room, MD [facility]  If 7PM-7AM, please contact night-coverage  12/16/2023, 10:20 AM

## 2023-12-16 NOTE — Progress Notes (Signed)
  Progress Note  Patient Name: Brittany Acosta Date of Encounter: 12/16/2023 [facility] Cardiologist: Shelda Bruckner, MD   Interval Summary   Still has some shortness of breath but feels like it has improved since yesterday.  Denies any chest pain, nausea, vomiting, fever, chills, melena, hematuria, and hematochezia.  Vital Signs Vitals:   12/15/23 2010 12/15/23 2341 12/16/23 0116 12/16/23 0745  BP: 118/73 (!) 140/71 133/72 (!) 111/49  Pulse: (!) 134 77 75 64  Resp:  20    Temp:  98.7 F (37.1 C)    TempSrc:  Oral    SpO2:  99%  99%  Weight:      Height:        Intake/Output Summary (Last 24 hours) at 12/16/2023 1345 Last data filed at 12/16/2023 9062 Gross per 24 hour  Intake 597 ml  Output 700 ml  Net -103 ml      12/13/2023    6:00 AM 12/08/2023   11:03 PM 12/08/2023    3:43 PM  Last 3 Weights  Weight (lbs) 214 lb 11.7 oz 209 lb 214 lb  Weight (kg) 97.4 kg 94.802 kg 97.07 kg      Telemetry/ECG  Converted from atrial fibrillation/flutter to sinus rhythm last night at about 10:30 PM.  Since then heart rates have been in the 60s to 80s- Personally Reviewed  Physical Exam  GEN: No acute distress.   Neck: No JVD Cardiac: RRR, no murmurs, rubs, or gallops.  Respiratory: Clear to auscultation bilaterally. GI: Soft, nontender, non-distended  MS: No edema  Assessment & Plan   Pericarditis Pericardial effusion without tamponade Had prior pericardiocentesis and drain placement on 12/09/2023.  On 12/12/2023 the drain was removed. Continue colchicine  0.6 mg twice daily Continue ibuprofen  600 mg 3 times daily Stop home aspirin  81 mg daily   New onset atrial fibrillation/flutter Atrial fibrillation started on 12/13/23 CHA2DS2-VASc Score = 5 [CHF History: 0, HTN History: 1, Diabetes History: 1, Stroke History: 0, Vascular Disease History: 1, Age Score: 1, Gender Score: 1].  Therefore, the patient's annual risk of stroke is 7.2 %.    On 12/14/23 IV amiodarone  and  bolus was started.  On 12/15/23 metoprolol  tartrate 12.5 mg every 6 hours was started.  Last night at about 10:30 PM she converted back to sinus rhythm. Stop IV amiodarone .  In total has received about 1.9 g. Start amiodarone  200 twice daily for 7 days.  Following this decrease to amiodarone  200 mg daily. Will avoid starting anticoagulation on this patient due to recent pericardiocentesis and drain increasing bleeding risk Stop metoprolol  tartrate 12.5 mg every 6 hours Start metoprolol   tartrate 25 mg twice daily   Hypertension Continue lisinopril  5 mg daily   Lower extremity edema Most recent echocardiogram on 12/13/2023 showed a normal LVEF of 65 to 70%, normal RV function, grossly normal valve function, and IVC with greater than 50% respiratory variability. Continue Lasix  40 mg daily. Continue potassium 20 mEq daily.   Hyperlipidemia Continue home pravastatin   Otherwise management per primary  [facility] will sign off.   The patient is ready for discharge today from a cardiac standpoint. Medication Recommendations:  as above Other recommendations (labs, testing, etc):  check LFT, PFT, and TSH with starting amiodarone . Follow up as an outpatient:  With Hao Meng on 12/28/23 For questions or updates, please contact [facility] Please consult [email or web address] for contact info under        Signed, Idolina Mantell, PA-C

## 2023-12-16 NOTE — Progress Notes (Signed)
 Mobility Specialist Progress Note:   12/16/23 0930  Mobility  Activity Ambulated with assistance  Level of Assistance Minimal assist, patient does 75% or more  Assistive Device Other (Comment) (HHA)  Distance Ambulated (ft) 10 ft  Activity Response Tolerated well  Mobility Referral Yes  Mobility visit 1 Mobility  Mobility Specialist Start Time (ACUTE ONLY) 0930  Mobility Specialist Stop Time (ACUTE ONLY) 0940  Mobility Specialist Time Calculation (min) (ACUTE ONLY) 10 min   Pt able to ambulate to/from bathroom with only minA needed to stand. HR 70s throughout. Pt with no c/o, happy to be out of the bed. Left in chair with all needs met.   Therisa Rana Mobility Specialist Please contact via SecureChat or  Rehab office at [phone]

## 2023-12-16 NOTE — Plan of Care (Signed)

## 2023-12-17 ENCOUNTER — Inpatient Hospital Stay (HOSPITAL_COMMUNITY)

## 2023-12-17 DIAGNOSIS — I3139 Other pericardial effusion (noninflammatory): Secondary | ICD-10-CM | POA: Diagnosis not present

## 2023-12-17 LAB — BASIC METABOLIC PANEL WITH GFR
Anion gap: 13 (ref 5–15)
BUN: 7 mg/dL — ABNORMAL LOW (ref 8–23)
CO2: 22 mmol/L (ref 22–32)
Calcium: 8.5 mg/dL — ABNORMAL LOW (ref 8.9–10.3)
Chloride: 93 mmol/L — ABNORMAL LOW (ref 98–111)
Creatinine, Ser: 0.72 mg/dL (ref 0.44–1.00)
GFR, Estimated: >60 mL/min (ref >60)
Glucose, Bld: 149 mg/dL — ABNORMAL HIGH (ref 70–99)
Potassium: 3.7 mmol/L (ref 3.5–5.1)
Sodium: 128 mmol/L — ABNORMAL LOW (ref 135–145)

## 2023-12-17 LAB — GLUCOSE, CAPILLARY
Glucose-Capillary: 155 mg/dL — ABNORMAL HIGH (ref 70–99)
Glucose-Capillary: 131 mg/dL — ABNORMAL HIGH (ref 70–99)
Glucose-Capillary: 87 mg/dL (ref 70–99)
Glucose-Capillary: 100 mg/dL — ABNORMAL HIGH (ref 70–99)

## 2023-12-17 LAB — CBC WITH DIFFERENTIAL/PLATELET
Abs Immature Granulocytes: 0.28 K/uL — ABNORMAL HIGH (ref 0.00–0.07)
Basophils Absolute: 0.1 K/uL (ref 0.0–0.1)
Basophils Relative: 1 %
Eosinophils Absolute: 0.7 K/uL — ABNORMAL HIGH (ref 0.0–0.5)
Eosinophils Relative: 6 %
HCT: 27.3 % — ABNORMAL LOW (ref 36.0–46.0)
Hemoglobin: 9.1 g/dL — ABNORMAL LOW (ref 12.0–15.0)
Immature Granulocytes: 2 %
Lymphocytes Relative: 11 %
Lymphs Abs: 1.4 K/uL (ref 0.7–4.0)
MCH: 27.6 pg (ref 26.0–34.0)
MCHC: 33.3 g/dL (ref 30.0–36.0)
MCV: 82.7 fL (ref 80.0–100.0)
Monocytes Absolute: 0.9 K/uL (ref 0.1–1.0)
Monocytes Relative: 7 %
Neutro Abs: 8.9 K/uL — ABNORMAL HIGH (ref 1.7–7.7)
Neutrophils Relative %: 73 %
Platelets: 793 K/uL — ABNORMAL HIGH (ref 150–400)
RBC: 3.3 MIL/uL — ABNORMAL LOW (ref 3.87–5.11)
RDW: 14.7 % (ref 11.5–15.5)
WBC: 12.2 K/uL — ABNORMAL HIGH (ref 4.0–10.5)
nRBC: 0 % (ref 0.0–0.2)

## 2023-12-17 MED ORDER — LOPERAMIDE HCL 2 MG PO CAPS
2.0000 mg | ORAL_CAPSULE | ORAL | Status: DC | PRN
  Administered 2023-12-17 – 2023-12-20 (×9): 2 mg via ORAL
  Filled 2023-12-17 (×9): qty 1

## 2023-12-17 MED ORDER — HYDRALAZINE HCL 20 MG/ML IJ SOLN
5.0000 mg | Freq: Four times a day (QID) | INTRAMUSCULAR | Status: DC | PRN
  Administered 2023-12-17 – 2023-12-26 (×2): 5 mg via INTRAVENOUS
  Filled 2023-12-17 (×2): qty 1

## 2023-12-17 NOTE — Progress Notes (Addendum)
 PROGRESS NOTE    Brittany Acosta  FMW:[id number] DOB: 02/02/1951 DOA: 12/08/2023 PCP: [name], MD   Brief Narrative:    73 y.o. female with a past medical history of diabetes mellitus type 2 on insulin , essential hypertension, Psoriatec arthropathy, history of gout, history of breast cancer, hyperlipidemia who was in her usual state of health till about 2 to 3 days ago when she started developing shortness of breath along with chest pain.  CT scan chest  which raise concern for moderate to large pericardial effusion. Case was discussed with cardiology who recommended hospitalization for further evaluation.  She was taken for OR for pericardiocentesis. She went into Afib with RVR on the night of 9/9, started on amiodarone  drip on 9/10. Cardiology on board. Converted to sinus rhythm now.  Assessment & Plan:  Principal Problem:   Pericardial effusion Active Problems:   Hypothyroidism   Hypertension associated with diabetes (HCC)   Poorly controlled type 2 diabetes mellitus with circulatory disorder (HCC)   Gout, tophaceous   Psoriatic arthropathy (HCC)   Hyponatremia   Atrial flutter (HCC)   Acute idiopathic pericarditis    Afib/flutter with RVR: Developed on the night of 9/9. Started on amiodarone  drip after amio bolus on 9/10. Cardiology on board Continue tele monitoring. Converted to sinus rhythm. If patients goes back into Afib/Aflutter, she will need to be started on [facility] for stroke prophylaxis.  Pericardial effusion:  Unclear etiology, differential include uremic etiology, vs viral infection.  ESR is 55, CRP is 23 Echocardiogram showed moderate effusion, with JVD and symptomatic with sob and intermittent chest pain.  S/p pericardiocentesis with 9/5 with out. Pericardial fluid sent for analysis, it was bloody in appearance. Gram stain is negative and cultures are pending.  Cytology was sent in view of her  history of breast cancer and is negative for malignancy. She  was empirically started on colchicine  0.6 mg BID along with ibuprofen  600 mg TID.  Repeat echo shows resolution of the pericardial effusion, drain removed by cardiology on 9/8.      Hypokalemia Prn repletion     Hyponatremia; secondary to SIADH/medications/Hypothyroidism. Sodium is 133 today.  TSH slightly high, free t4 elevated. Urine sodium is less than 30, and urine osmolality is 187. Serum osmo is 271. Am cortisol is 8.8  Suspect from medications , she remains asymptomatic.  Stopped zoloft  Since she underwent pericardiocentesis and almost 900 ml fluid drained Nephrology on board. F/u sodium levels daily     Hypothyroidism:  Resumed synthroid . Abnormal thyroid  panel  with slightly elevated TSH and elevated free t4.  Recheck thyroid  panel in 4 weeks.      Type 2 DM with hyperglycemia:  Continue monitoring and SSI     Hyperlipidemia Resumed pravastatin  20 mg daily.      Bipolar disorder Resumed Zoloft      Normocytic anemia Baseline hemoglobin appears to be around 10.     Vitamin b12 deficiency/ Iron deficiency anemia Replacements ordered.   Disposition: Lives with her husband. HHPT on discharge.     DVT prophylaxis: SCD's Start: 12/09/23 1559 SCDs Start: 12/08/23 2335     Code Status: Full Code Family Communication:  None at the bedside Status is: Inpatient Remains inpatient appropriate because: Afib with RVR, s/p pericardiocentesis    Subjective:  No acute events overnight. She felt nauseous this morning. She is also getting significantly short of breath with minimal exertion, We spoke about her physical deconditioning. We also spoke about getting a CXR  today.  Examination:  General exam: Appears calm and comfortable Respiratory system: Clear to auscultation. Respiratory effort normal. Cardiovascular system:sinus rhythm, normal heart rate,No pedal edema. Gastrointestinal system: Abdomen is nondistended, soft and nontender. No organomegaly or masses  felt. Normal bowel sounds heard. Central nervous system: Alert and oriented. No focal neurological deficits. Extremities: Symmetric 5 x 5 power. Skin: No rashes, lesions or ulcers Psychiatry: Judgement and insight appear normal. Mood & affect appropriate.     Diet Orders (From admission, onward)     Start     Ordered   12/09/23 1559  Diet Heart Room service appropriate? Yes; Fluid consistency: Thin  Diet effective now       Question Answer Comment  Room service appropriate? Yes   Fluid consistency: Thin      12/09/23 1558            Objective: Vitals:   12/16/23 2124 12/16/23 2331 12/17/23 0620 12/17/23 0734  BP: (!) 173/80 (!) 169/56 (!) 175/77 (!) 157/71  Pulse: 75 73  73  Resp: 16 20 20 18   Temp: 98.9 F (37.2 C) 98 F (36.7 C) (!) 97.5 F (36.4 C) 98.1 F (36.7 C)  TempSrc: Oral Oral Oral Oral  SpO2: 98% 97% 94% 95%  Weight:      Height:        Intake/Output Summary (Last 24 hours) at 12/17/2023 1012 Last data filed at 12/16/2023 2145 Gross per 24 hour  Intake 593 ml  Output --  Net 593 ml   Filed Weights   12/08/23 1543 12/08/23 2303 12/13/23 0600  Weight: 97.1 kg 94.8 kg 97.4 kg    Scheduled Meds:  allopurinol   300 mg Oral Daily   amiodarone   200 mg Oral BID   [START ON 12/23/2023] amiodarone   200 mg Oral Daily   benzonatate   200 mg Oral BID   colchicine   0.6 mg Oral BID   docusate sodium   100 mg Oral BID   doxepin   10 mg Oral Daily   ferrous sulfate   325 mg Oral Q breakfast   ibuprofen   600 mg Oral TID   insulin  aspart  0-15 Units Subcutaneous TID WC   insulin  glargine  15 Units Subcutaneous Q24H   lamoTRIgine   200 mg Oral Q1200   levothyroxine   50 mcg Oral Daily   lisinopril   5 mg Oral Daily   metoprolol  tartrate  25 mg Oral BID   oxybutynin   10 mg Oral QHS   pantoprazole   40 mg Oral BID   pravastatin   20 mg Oral QHS   sodium chloride  flush  3 mL Intravenous Q12H   sodium chloride   1 g Oral BID WC   vitamin B-12  500 mcg Oral Daily    Continuous Infusions:    Nutritional status     Body mass index is 39.92 kg/m.  Data Reviewed:   CBC: Recent Labs  Lab 12/11/23 0327 12/12/23 0333 12/13/23 0142 12/14/23 0222 12/16/23 0438 12/17/23 0908  WBC 15.0* 15.2* 14.5* 12.4* 13.3* 12.2*  NEUTROABS 10.9*  --  10.3* 8.0* 7.8* 8.9*  HGB 9.8* 9.0* 8.8* 8.1* 8.9* 9.1*  HCT 28.9* 26.9* 26.0* 24.5* 27.0* 27.3*  MCV 83.5 83.0 82.8 83.1 83.9 82.7  PLT 483* 569* 617* 592* 838* 793*   Basic Metabolic Panel: Recent Labs  Lab 12/12/23 0333 12/12/23 2041 12/13/23 0142 12/13/23 1523 12/14/23 0221 12/14/23 0222 12/15/23 0441 12/16/23 0438  NA 127*   < > 125* 131*  --  130* 133* 133*  K  3.8  --  4.0  --   --  3.5 4.3 4.2  CL 94*  --  92*  --   --  96* 97* 98  CO2 22  --  22  --   --  22 22 22   GLUCOSE 166*  --  246*  --   --  156* 145* 122*  BUN 7*  --  9  --   --  10 9 8   CREATININE 0.61  --  0.84  --   --  0.70 0.68 0.89  CALCIUM  8.1*  --  8.1*  --   --  8.2* 8.5* 8.5*  MG  --   --   --   --  1.5*  --   --   --   PHOS  --   --   --   --   --  4.0  --   --    < > = values in this interval not displayed.   GFR: Estimated Creatinine Clearance: 60.8 mL/min (by C-G formula based on SCr of 0.89 mg/dL). Liver Function Tests: Recent Labs  Lab 12/14/23 0222  ALBUMIN  2.2*   No results for input(s): LIPASE, AMYLASE in the last 168 hours. No results for input(s): AMMONIA in the last 168 hours. Coagulation Profile: No results for input(s): INR, PROTIME in the last 168 hours. Cardiac Enzymes: No results for input(s): CKTOTAL, CKMB, CKMBINDEX, TROPONINI in the last 168 hours. BNP (last 3 results) Recent Labs    12/08/23 1700  PROBNP 725.0*   HbA1C: No results for input(s): HGBA1C in the last 72 hours. CBG: Recent Labs  Lab 12/16/23 0804 12/16/23 1146 12/16/23 1644 12/16/23 2124 12/17/23 0731  GLUCAP 125* 209* 104* 108* 155*   Lipid Profile: No results for input(s): CHOL, HDL,  LDLCALC, TRIG, CHOLHDL, LDLDIRECT in the last 72 hours. Thyroid  Function Tests: No results for input(s): TSH, T4TOTAL, FREET4, T3FREE, THYROIDAB in the last 72 hours. Anemia Panel: No results for input(s): VITAMINB12, FOLATE, FERRITIN, TIBC, IRON, RETICCTPCT in the last 72 hours. Sepsis Labs: Recent Labs  Lab 12/11/23 0327 12/14/23 0221  PROCALCITON <0.10 <0.10    Recent Results (from the past 240 hours)  Surgical pcr screen     Status: None   Collection Time: 12/08/23  2:54 PM   Specimen: Nasal Mucosa; Nasal Swab  Result Value Ref Range Status   MRSA, PCR NEGATIVE NEGATIVE Final   Staphylococcus aureus NEGATIVE NEGATIVE Final    Comment: (NOTE) The Xpert SA Assay (FDA approved for NASAL specimens in patients 47 years of age and older), is one component of a comprehensive surveillance program. It is not intended to diagnose infection nor to guide or monitor treatment. Performed at [facility], [address] [place], [place] [address]   Resp panel by RT-PCR (RSV, Flu A&B, Covid) Anterior Nasal Swab     Status: None   Collection Time: 12/08/23  4:53 PM   Specimen: Anterior Nasal Swab  Result Value Ref Range Status   SARS Coronavirus 2 by RT PCR NEGATIVE NEGATIVE Final    Comment: (NOTE) SARS-CoV-2 target nucleic acids are NOT DETECTED.  The SARS-CoV-2 RNA is generally detectable in upper respiratory specimens during the acute phase of infection. The lowest concentration of SARS-CoV-2 viral copies this assay can detect is 138 copies/mL. A negative result does not preclude SARS-Cov-2 infection and should not be used as the sole basis for treatment or other patient management decisions. A negative result may occur with  improper specimen collection/handling, submission of specimen other than nasopharyngeal swab, presence of viral mutation(s) within the areas targeted by this assay, and inadequate number of viral copies(<138  copies/mL). A negative result must be combined with clinical observations, patient history, and epidemiological information. The expected result is Negative.  Fact Sheet for Patients:  BloggerCourse.com  Fact Sheet for Healthcare Providers:  SeriousBroker.it  This test is no t yet approved or cleared by the [place]  FDA and  has been authorized for detection and/or diagnosis of SARS-CoV-2 by FDA under an Emergency Use Authorization (EUA). This EUA will remain  in effect (meaning this test can be used) for the duration of the COVID-19 declaration under Section 564(b)(1) of the Act, 21 U.S.C.section 360bbb-3(b)(1), unless the authorization is terminated  or revoked sooner.       Influenza A by PCR NEGATIVE NEGATIVE Final   Influenza B by PCR NEGATIVE NEGATIVE Final    Comment: (NOTE) The Xpert Xpress SARS-CoV-2/FLU/RSV plus assay is intended as an aid in the diagnosis of influenza from Nasopharyngeal swab specimens and should not be used as a sole basis for treatment. Nasal washings and aspirates are unacceptable for Xpert Xpress SARS-CoV-2/FLU/RSV testing.  Fact Sheet for Patients: BloggerCourse.com  Fact Sheet for Healthcare Providers: SeriousBroker.it  This test is not yet approved or cleared by the [place]  FDA and has been authorized for detection and/or diagnosis of SARS-CoV-2 by FDA under an Emergency Use Authorization (EUA). This EUA will remain in effect (meaning this test can be used) for the duration of the COVID-19 declaration under Section 564(b)(1) of the Act, 21 U.S.C. section 360bbb-3(b)(1), unless the authorization is terminated or revoked.     Resp Syncytial Virus by PCR NEGATIVE NEGATIVE Final    Comment: (NOTE) Fact Sheet for Patients: BloggerCourse.com  Fact Sheet for Healthcare  Providers: SeriousBroker.it  This test is not yet approved or cleared by the [place]  FDA and has been authorized for detection and/or diagnosis of SARS-CoV-2 by FDA under an Emergency Use Authorization (EUA). This EUA will remain in effect (meaning this test can be used) for the duration of the COVID-19 declaration under Section 564(b)(1) of the Act, 21 U.S.C. section 360bbb-3(b)(1), unless the authorization is terminated or revoked.  Performed at [facility], [address] [place], [place] [address]   Body fluid culture w Gram Stain     Status: None   Collection Time: 12/09/23  3:04 PM   Specimen: PATH Cytology Misc. fluid; Body Fluid  Result Value Ref Range Status   Specimen Description PERICARDIAL  Final   Special Requests NONE  Final   Gram Stain   Final    FEW WBC PRESENT, PREDOMINANTLY MONONUCLEAR NO ORGANISMS SEEN    Culture   Final    NO GROWTH 3 DAYS Performed at [facility], [address], [place], [place] [address]    Report Status 12/12/2023 FINAL  Final  Respiratory (~20 pathogens) panel by PCR     Status: None   Collection Time: 12/11/23  4:02 PM   Specimen: Nasopharyngeal Swab; Respiratory  Result Value Ref Range Status   Adenovirus NOT DETECTED NOT DETECTED Final   Coronavirus 229E NOT DETECTED NOT DETECTED Final    Comment: (NOTE) The Coronavirus on the Respiratory Panel, DOES NOT test for the novel  Coronavirus (2019 nCoV)    Coronavirus HKU1 NOT DETECTED NOT DETECTED Final   Coronavirus NL63 NOT DETECTED NOT DETECTED Final   Coronavirus OC43 NOT DETECTED NOT DETECTED Final  Metapneumovirus NOT DETECTED NOT DETECTED Final   Rhinovirus / Enterovirus NOT DETECTED NOT DETECTED Final   Influenza A NOT DETECTED NOT DETECTED Final   Influenza B NOT DETECTED NOT DETECTED Final   Parainfluenza Virus 1 NOT DETECTED NOT DETECTED Final   Parainfluenza Virus 2 NOT DETECTED NOT DETECTED Final    Parainfluenza Virus 3 NOT DETECTED NOT DETECTED Final   Parainfluenza Virus 4 NOT DETECTED NOT DETECTED Final   Respiratory Syncytial Virus NOT DETECTED NOT DETECTED Final   Bordetella pertussis NOT DETECTED NOT DETECTED Final   Bordetella Parapertussis NOT DETECTED NOT DETECTED Final   Chlamydophila pneumoniae NOT DETECTED NOT DETECTED Final   Mycoplasma pneumoniae NOT DETECTED NOT DETECTED Final    Comment: Performed at [facility], [address] [place], [place] [address]         Radiology Studies: No results found.      LOS: 9 days   Time spent= 42 mins    Deliliah Room, MD [facility]  If 7PM-7AM, please contact night-coverage  12/17/2023, 10:12 AM

## 2023-12-17 NOTE — Plan of Care (Signed)
   Problem: Activity: Goal: Ability to tolerate increased activity will improve Outcome: Progressing   Problem: Cardiac: Goal: Ability to achieve and maintain adequate cardiovascular perfusion will improve Outcome: Progressing

## 2023-12-17 NOTE — Progress Notes (Signed)
 Chart reviewed, pt not seen. Maintaining SR. On amiodarone  orally.  Has f/u 9/24 in CV.   Cardiology will sign off.  Larah Kuntzman A Randon Somera, MD

## 2023-12-17 NOTE — Plan of Care (Signed)
   Problem: Education: Goal: Knowledge of General Education information will improve Description Including pain rating scale, medication(s)/side effects and non-pharmacologic comfort measures Outcome: Progressing

## 2023-12-18 DIAGNOSIS — I3139 Other pericardial effusion (noninflammatory): Secondary | ICD-10-CM | POA: Diagnosis not present

## 2023-12-18 LAB — CBC WITH DIFFERENTIAL/PLATELET
Abs Immature Granulocytes: 0.3 K/uL — ABNORMAL HIGH (ref 0.00–0.07)
Basophils Absolute: 0.1 K/uL (ref 0.0–0.1)
Basophils Relative: 1 %
Eosinophils Absolute: 0.6 K/uL — ABNORMAL HIGH (ref 0.0–0.5)
Eosinophils Relative: 6 %
HCT: 30.2 % — ABNORMAL LOW (ref 36.0–46.0)
Hemoglobin: 9.9 g/dL — ABNORMAL LOW (ref 12.0–15.0)
Immature Granulocytes: 3 %
Lymphocytes Relative: 18 %
Lymphs Abs: 2 K/uL (ref 0.7–4.0)
MCH: 27.7 pg (ref 26.0–34.0)
MCHC: 32.8 g/dL (ref 30.0–36.0)
MCV: 84.6 fL (ref 80.0–100.0)
Monocytes Absolute: 0.8 K/uL (ref 0.1–1.0)
Monocytes Relative: 7 %
Neutro Abs: 7.3 K/uL (ref 1.7–7.7)
Neutrophils Relative %: 65 %
Platelets: 754 K/uL — ABNORMAL HIGH (ref 150–400)
RBC: 3.57 MIL/uL — ABNORMAL LOW (ref 3.87–5.11)
RDW: 14.7 % (ref 11.5–15.5)
WBC: 11 K/uL — ABNORMAL HIGH (ref 4.0–10.5)
nRBC: 0 % (ref 0.0–0.2)

## 2023-12-18 LAB — BASIC METABOLIC PANEL WITH GFR
Anion gap: 15 (ref 5–15)
BUN: 8 mg/dL (ref 8–23)
CO2: 19 mmol/L — ABNORMAL LOW (ref 22–32)
Calcium: 8.6 mg/dL — ABNORMAL LOW (ref 8.9–10.3)
Chloride: 94 mmol/L — ABNORMAL LOW (ref 98–111)
Creatinine, Ser: 0.82 mg/dL (ref 0.44–1.00)
GFR, Estimated: >60 mL/min (ref >60)
Glucose, Bld: 77 mg/dL (ref 70–99)
Potassium: 3.4 mmol/L — ABNORMAL LOW (ref 3.5–5.1)
Sodium: 128 mmol/L — ABNORMAL LOW (ref 135–145)

## 2023-12-18 LAB — GASTROINTESTINAL PANEL BY PCR, STOOL (REPLACES STOOL CULTURE)

## 2023-12-18 LAB — GLUCOSE, CAPILLARY
Glucose-Capillary: 87 mg/dL (ref 70–99)
Glucose-Capillary: 144 mg/dL — ABNORMAL HIGH (ref 70–99)
Glucose-Capillary: 102 mg/dL — ABNORMAL HIGH (ref 70–99)
Glucose-Capillary: 123 mg/dL — ABNORMAL HIGH (ref 70–99)

## 2023-12-18 MED ORDER — SODIUM CHLORIDE 0.9 % IV SOLN: INTRAVENOUS | Status: AC

## 2023-12-18 MED ORDER — POTASSIUM CHLORIDE 20 MEQ PO PACK
40.0000 meq | PACK | Freq: Once | ORAL | Status: AC
  Administered 2023-12-18: 40 meq via ORAL
  Filled 2023-12-18: qty 2

## 2023-12-18 NOTE — Plan of Care (Signed)

## 2023-12-18 NOTE — Plan of Care (Signed)

## 2023-12-18 NOTE — Plan of Care (Signed)
  Problem: Elimination: Goal: Will not experience complications related to bowel motility Outcome: Progressing Goal: Will not experience complications related to urinary retention Outcome: Progressing   Problem: Pain Managment: Goal: General experience of comfort will improve and/or be controlled Outcome: Progressing

## 2023-12-18 NOTE — Progress Notes (Signed)
 Mobility Specialist Progress Note:   12/18/23 1000  Mobility  Activity Ambulated with assistance  Level of Assistance Contact guard assist, steadying assist  Assistive Device Front wheel walker  Distance Ambulated (ft) 80 ft  Activity Response Tolerated well  Mobility Referral Yes  Mobility visit 1 Mobility  Mobility Specialist Start Time (ACUTE ONLY) 1000  Mobility Specialist Stop Time (ACUTE ONLY) 1010  Mobility Specialist Time Calculation (min) (ACUTE ONLY) 10 min   Pt agreeable to mobility session. Required contact assist for steadying d/t x1 LOB when turning. Distance limited by fatigue. Pt back in chair with all needs met.  Therisa Rana Mobility Specialist Please contact via SecureChat or  Rehab office at [phone]

## 2023-12-18 NOTE — Progress Notes (Addendum)
 PROGRESS NOTE    Brittany Acosta  FMW:[id number] DOB: 18-Feb-1951 DOA: 12/08/2023 PCP: [name], MD   Brief Narrative:    73 y.o. female with a past medical history of diabetes mellitus type 2 on insulin , essential hypertension, Psoriatec arthropathy, history of gout, history of breast cancer, hyperlipidemia who was in her usual state of health till about 2 to 3 days ago when she started developing shortness of breath along with chest pain.  CT scan chest  which raise concern for moderate to large pericardial effusion. Case was discussed with cardiology who recommended hospitalization for further evaluation.  She was taken for OR for pericardiocentesis. She went into Afib with RVR on the night of 9/9, started on amiodarone  drip on 9/10. Cardiology on board. Converted to sinus rhythm now.  Assessment & Plan:  Principal Problem:   Pericardial effusion Active Problems:   Hypothyroidism   Hypertension associated with diabetes (HCC)   Poorly controlled type 2 diabetes mellitus with circulatory disorder (HCC)   Gout, tophaceous   Psoriatic arthropathy (HCC)   Hyponatremia   Atrial flutter (HCC)   Acute idiopathic pericarditis    Afib/flutter with RVR: Developed on the night of 9/9. Started on amiodarone  drip after amio bolus on 9/10. Cardiology on board Continue tele monitoring. Converted to sinus rhythm. If patients goes back into Afib/Aflutter, she will need to be started on [employer] for stroke prophylaxis.  Pericardial effusion:  Unclear etiology, differential include uremic etiology, vs viral infection.  ESR is 55, CRP is 23 Echocardiogram showed moderate effusion, with JVD and symptomatic with sob and intermittent chest pain.  S/p pericardiocentesis with 9/5 with out. Pericardial fluid sent for analysis, it was bloody in appearance. Gram stain is negative and cultures are pending.  Cytology was sent in view of her  history of breast cancer and is negative for malignancy. She  was empirically started on colchicine  0.6 mg BID along with ibuprofen  600 mg TID.  Repeat echo shows resolution of the pericardial effusion, drain removed by cardiology on 9/8.   Nausea, vomiting and diarrhea: Develop on 9/13. Getting imodium  prn along with anti-emetics. Denies abdominal pain Ordered GI panel by PCR     Hypokalemia Prn repletion     Hyponatremia; secondary to SIADH/medications/Hypothyroidism. Sodium is 128 today. She had few episodes of emesis and diarrhea on 9/13.  TSH slightly high, free t4 elevated. Urine sodium is less than 30, and urine osmolality is 187. Serum osmo is 271. Am cortisol is 8.8  Suspect from medications , she remains asymptomatic.  Stopped zoloft  Since she underwent pericardiocentesis and almost 900 ml fluid drained Nephrology on board. F/u sodium levels daily Ordered NS at 50cc/hr x 10 hours today because of hypovolemic hyponatremia.     Hypothyroidism:  Resumed synthroid . Abnormal thyroid  panel  with slightly elevated TSH and elevated free t4.  Recheck thyroid  panel in 4 weeks.      Type 2 DM with hyperglycemia:  Continue monitoring and SSI     Hyperlipidemia Resumed pravastatin  20 mg daily.      Bipolar disorder Resumed Zoloft      Normocytic anemia Baseline hemoglobin appears to be around 10.     Vitamin b12 deficiency/ Iron deficiency anemia Replacements ordered.   Disposition: Lives with her husband. HHPT on discharge.     DVT prophylaxis: SCD's Start: 12/09/23 1559 SCDs Start: 12/08/23 2335     Code Status: Full Code Family Communication:  None at the bedside Status is: Inpatient Remains inpatient appropriate  because: Afib with RVR, s/p pericardiocentesis    Subjective:  She had 4-5 episodes of diarrhea yesterday along with couple of episodes of emesis. She had no episodes this morning. She has shortness of breath on minimal exertion and doesn't feel ready to go home today.  Examination:  General exam:  Appears calm and comfortable Respiratory system: Clear to auscultation. Respiratory effort normal. Cardiovascular system:sinus rhythm, normal heart rate,No pedal edema. Gastrointestinal system: Abdomen is nondistended, soft and nontender. No organomegaly or masses felt. Normal bowel sounds heard. Central nervous system: Alert and oriented. No focal neurological deficits. Extremities: Symmetric 5 x 5 power. Skin: No rashes, lesions or ulcers Psychiatry: Judgement and insight appear normal. Mood & affect appropriate.     Diet Orders (From admission, onward)     Start     Ordered   12/09/23 1559  Diet Heart Room service appropriate? Yes; Fluid consistency: Thin  Diet effective now       Question Answer Comment  Room service appropriate? Yes   Fluid consistency: Thin      12/09/23 1558            Objective: Vitals:   12/17/23 2059 12/17/23 2307 12/18/23 0418 12/18/23 0731  BP: (!) 152/69 (!) 153/52 (!) 141/71 131/64  Pulse: 71 61 88 63  Resp:  20 20 19   Temp:  98.6 F (37 C) 97.7 F (36.5 C) 97.9 F (36.6 C)  TempSrc:  Oral Oral Oral  SpO2: 98% 98%  97%  Weight:      Height:        Intake/Output Summary (Last 24 hours) at 12/18/2023 0937 Last data filed at 12/17/2023 1121 Gross per 24 hour  Intake 240 ml  Output --  Net 240 ml   Filed Weights   12/08/23 1543 12/08/23 2303 12/13/23 0600  Weight: 97.1 kg 94.8 kg 97.4 kg    Scheduled Meds:  allopurinol   300 mg Oral Daily   amiodarone   200 mg Oral BID   [START ON 12/23/2023] amiodarone   200 mg Oral Daily   benzonatate   200 mg Oral BID   colchicine   0.6 mg Oral BID   docusate sodium   100 mg Oral BID   doxepin   10 mg Oral Daily   ferrous sulfate   325 mg Oral Q breakfast   ibuprofen   600 mg Oral TID   insulin  aspart  0-15 Units Subcutaneous TID WC   insulin  glargine  15 Units Subcutaneous Q24H   lamoTRIgine   200 mg Oral Q1200   levothyroxine   50 mcg Oral Daily   lisinopril   5 mg Oral Daily   metoprolol   tartrate  25 mg Oral BID   oxybutynin   10 mg Oral QHS   pantoprazole   40 mg Oral BID   pravastatin   20 mg Oral QHS   sodium chloride  flush  3 mL Intravenous Q12H   sodium chloride   1 g Oral BID WC   vitamin B-12  500 mcg Oral Daily   Continuous Infusions:    Nutritional status     Body mass index is 39.92 kg/m.  Data Reviewed:   CBC: Recent Labs  Lab 12/13/23 0142 12/14/23 0222 12/16/23 0438 12/17/23 0908 12/18/23 0417  WBC 14.5* 12.4* 13.3* 12.2* 11.0*  NEUTROABS 10.3* 8.0* 7.8* 8.9* 7.3  HGB 8.8* 8.1* 8.9* 9.1* 9.9*  HCT 26.0* 24.5* 27.0* 27.3* 30.2*  MCV 82.8 83.1 83.9 82.7 84.6  PLT 617* 592* 838* 793* 754*   Basic Metabolic Panel: Recent Labs  Lab 12/14/23 0221  12/14/23 0222 12/15/23 0441 12/16/23 0438 12/17/23 0908 12/18/23 0417  NA  --  130* 133* 133* 128* 128*  K  --  3.5 4.3 4.2 3.7 3.4*  CL  --  96* 97* 98 93* 94*  CO2  --  22 22 22 22  19*  GLUCOSE  --  156* 145* 122* 149* 77  BUN  --  10 9 8  7* 8  CREATININE  --  0.70 0.68 0.89 0.72 0.82  CALCIUM   --  8.2* 8.5* 8.5* 8.5* 8.6*  MG 1.5*  --   --   --   --   --   PHOS  --  4.0  --   --   --   --    GFR: Estimated Creatinine Clearance: 66 mL/min (by C-G formula based on SCr of 0.82 mg/dL). Liver Function Tests: Recent Labs  Lab 12/14/23 0222  ALBUMIN  2.2*   No results for input(s): LIPASE, AMYLASE in the last 168 hours. No results for input(s): AMMONIA in the last 168 hours. Coagulation Profile: No results for input(s): INR, PROTIME in the last 168 hours. Cardiac Enzymes: No results for input(s): CKTOTAL, CKMB, CKMBINDEX, TROPONINI in the last 168 hours. BNP (last 3 results) Recent Labs    12/08/23 1700  PROBNP 725.0*   HbA1C: No results for input(s): HGBA1C in the last 72 hours. CBG: Recent Labs  Lab 12/17/23 0731 12/17/23 1117 12/17/23 1630 12/17/23 1956 12/18/23 0733  GLUCAP 155* 131* 87 100* 87   Lipid Profile: No results for input(s): CHOL,  HDL, LDLCALC, TRIG, CHOLHDL, LDLDIRECT in the last 72 hours. Thyroid  Function Tests: No results for input(s): TSH, T4TOTAL, FREET4, T3FREE, THYROIDAB in the last 72 hours. Anemia Panel: No results for input(s): VITAMINB12, FOLATE, FERRITIN, TIBC, IRON, RETICCTPCT in the last 72 hours. Sepsis Labs: Recent Labs  Lab 12/14/23 0221  PROCALCITON <0.10    Recent Results (from the past 240 hours)  Surgical pcr screen     Status: None   Collection Time: 12/08/23  2:54 PM   Specimen: Nasal Mucosa; Nasal Swab  Result Value Ref Range Status   MRSA, PCR NEGATIVE NEGATIVE Final   Staphylococcus aureus NEGATIVE NEGATIVE Final    Comment: (NOTE) The Xpert SA Assay (FDA approved for NASAL specimens in patients 45 years of age and older), is one component of a comprehensive surveillance program. It is not intended to diagnose infection nor to guide or monitor treatment. Performed at [facility], [address] [place], [place] [address]   Resp panel by RT-PCR (RSV, Flu A&B, Covid) Anterior Nasal Swab     Status: None   Collection Time: 12/08/23  4:53 PM   Specimen: Anterior Nasal Swab  Result Value Ref Range Status   SARS Coronavirus 2 by RT PCR NEGATIVE NEGATIVE Final    Comment: (NOTE) SARS-CoV-2 target nucleic acids are NOT DETECTED.  The SARS-CoV-2 RNA is generally detectable in upper respiratory specimens during the acute phase of infection. The lowest concentration of SARS-CoV-2 viral copies this assay can detect is 138 copies/mL. A negative result does not preclude SARS-Cov-2 infection and should not be used as the sole basis for treatment or other patient management decisions. A negative result may occur with  improper specimen collection/handling, submission of specimen other than nasopharyngeal swab, presence of viral mutation(s) within the areas targeted by this assay, and inadequate number of viral copies(<138 copies/mL). A  negative result must be combined with clinical observations, patient history, and epidemiological information. The expected  result is Negative.  Fact Sheet for Patients:  BloggerCourse.com  Fact Sheet for Healthcare Providers:  SeriousBroker.it  This test is no t yet approved or cleared by the [place]  FDA and  has been authorized for detection and/or diagnosis of SARS-CoV-2 by FDA under an Emergency Use Authorization (EUA). This EUA will remain  in effect (meaning this test can be used) for the duration of the COVID-19 declaration under Section 564(b)(1) of the Act, 21 U.S.C.section 360bbb-3(b)(1), unless the authorization is terminated  or revoked sooner.       Influenza A by PCR NEGATIVE NEGATIVE Final   Influenza B by PCR NEGATIVE NEGATIVE Final    Comment: (NOTE) The Xpert Xpress SARS-CoV-2/FLU/RSV plus assay is intended as an aid in the diagnosis of influenza from Nasopharyngeal swab specimens and should not be used as a sole basis for treatment. Nasal washings and aspirates are unacceptable for Xpert Xpress SARS-CoV-2/FLU/RSV testing.  Fact Sheet for Patients: BloggerCourse.com  Fact Sheet for Healthcare Providers: SeriousBroker.it  This test is not yet approved or cleared by the [place]  FDA and has been authorized for detection and/or diagnosis of SARS-CoV-2 by FDA under an Emergency Use Authorization (EUA). This EUA will remain in effect (meaning this test can be used) for the duration of the COVID-19 declaration under Section 564(b)(1) of the Act, 21 U.S.C. section 360bbb-3(b)(1), unless the authorization is terminated or revoked.     Resp Syncytial Virus by PCR NEGATIVE NEGATIVE Final    Comment: (NOTE) Fact Sheet for Patients: BloggerCourse.com  Fact Sheet for Healthcare  Providers: SeriousBroker.it  This test is not yet approved or cleared by the [place]  FDA and has been authorized for detection and/or diagnosis of SARS-CoV-2 by FDA under an Emergency Use Authorization (EUA). This EUA will remain in effect (meaning this test can be used) for the duration of the COVID-19 declaration under Section 564(b)(1) of the Act, 21 U.S.C. section 360bbb-3(b)(1), unless the authorization is terminated or revoked.  Performed at [facility], [address], [place], [place] [address]   Body fluid culture w Gram Stain     Status: None   Collection Time: 12/09/23  3:04 PM   Specimen: PATH Cytology Misc. fluid; Body Fluid  Result Value Ref Range Status   Specimen Description PERICARDIAL  Final   Special Requests NONE  Final   Gram Stain   Final    FEW WBC PRESENT, PREDOMINANTLY MONONUCLEAR NO ORGANISMS SEEN    Culture   Final    NO GROWTH 3 DAYS Performed at [facility], [address], [place], [place] [address]    Report Status 12/12/2023 FINAL  Final  Respiratory (~20 pathogens) panel by PCR     Status: None   Collection Time: 12/11/23  4:02 PM   Specimen: Nasopharyngeal Swab; Respiratory  Result Value Ref Range Status   Adenovirus NOT DETECTED NOT DETECTED Final   Coronavirus 229E NOT DETECTED NOT DETECTED Final    Comment: (NOTE) The Coronavirus on the Respiratory Panel, DOES NOT test for the novel  Coronavirus (2019 nCoV)    Coronavirus HKU1 NOT DETECTED NOT DETECTED Final   Coronavirus NL63 NOT DETECTED NOT DETECTED Final   Coronavirus OC43 NOT DETECTED NOT DETECTED Final   Metapneumovirus NOT DETECTED NOT DETECTED Final   Rhinovirus / Enterovirus NOT DETECTED NOT DETECTED Final   Influenza A NOT DETECTED NOT DETECTED Final   Influenza B NOT DETECTED NOT DETECTED Final   Parainfluenza Virus 1 NOT DETECTED NOT DETECTED  Final   Parainfluenza Virus 2 NOT DETECTED NOT DETECTED Final    Parainfluenza Virus 3 NOT DETECTED NOT DETECTED Final   Parainfluenza Virus 4 NOT DETECTED NOT DETECTED Final   Respiratory Syncytial Virus NOT DETECTED NOT DETECTED Final   Bordetella pertussis NOT DETECTED NOT DETECTED Final   Bordetella Parapertussis NOT DETECTED NOT DETECTED Final   Chlamydophila pneumoniae NOT DETECTED NOT DETECTED Final   Mycoplasma pneumoniae NOT DETECTED NOT DETECTED Final    Comment: Performed at [facility], [address] [place], [place] [address]         Radiology Studies: DG CHEST PORT 1 VIEW Result Date: 12/17/2023 CLINICAL DATA:  Dyspnea EXAM: PORTABLE CHEST 1 VIEW COMPARISON:  12/13/2023 FINDINGS: Single frontal view of the chest demonstrates mild enlargement of the cardiac silhouette. No airspace disease, effusion, or pneumothorax. Stable right shoulder arthroplasty. No acute fractures. IMPRESSION: 1. Mild enlargement of the cardiac silhouette. 2. No acute airspace disease. Electronically Signed   By: Ozell Daring M.D.   On: 12/17/2023 14:52        LOS: 10 days   Time spent= 42 mins    Deliliah Room, MD [facility]  If 7PM-7AM, please contact night-coverage  12/18/2023, 9:37 AM

## 2023-12-19 DIAGNOSIS — I3139 Other pericardial effusion (noninflammatory): Secondary | ICD-10-CM | POA: Diagnosis not present

## 2023-12-19 LAB — BASIC METABOLIC PANEL WITH GFR
Anion gap: 12 (ref 5–15)
BUN: 7 mg/dL — ABNORMAL LOW (ref 8–23)
CO2: 22 mmol/L (ref 22–32)
Calcium: 8.7 mg/dL — ABNORMAL LOW (ref 8.9–10.3)
Chloride: 97 mmol/L — ABNORMAL LOW (ref 98–111)
Creatinine, Ser: 0.85 mg/dL (ref 0.44–1.00)
GFR, Estimated: >60 mL/min (ref >60)
Glucose, Bld: 67 mg/dL — ABNORMAL LOW (ref 70–99)
Potassium: 4.1 mmol/L (ref 3.5–5.1)
Sodium: 131 mmol/L — ABNORMAL LOW (ref 135–145)

## 2023-12-19 LAB — GLUCOSE, CAPILLARY
Glucose-Capillary: 114 mg/dL — ABNORMAL HIGH (ref 70–99)
Glucose-Capillary: 45 mg/dL — ABNORMAL LOW (ref 70–99)
Glucose-Capillary: 63 mg/dL — ABNORMAL LOW (ref 70–99)
Glucose-Capillary: 96 mg/dL (ref 70–99)
Glucose-Capillary: 86 mg/dL (ref 70–99)
Glucose-Capillary: 99 mg/dL (ref 70–99)

## 2023-12-19 LAB — MAGNESIUM: Magnesium: 1.5 mg/dL — ABNORMAL LOW (ref 1.7–2.4)

## 2023-12-19 MED ORDER — DEXTROSE 50 % IV SOLN
INTRAVENOUS | Status: AC
  Filled 2023-12-19: qty 50

## 2023-12-19 MED ORDER — INSULIN GLARGINE 100 UNIT/ML ~~LOC~~ SOLN
5.0000 [IU] | SUBCUTANEOUS | Status: DC
  Filled 2023-12-19: qty 0.05

## 2023-12-19 NOTE — Progress Notes (Signed)
   12/19/23 1516  What Happened  Was fall witnessed? No  Was patient injured? No  Patient found in bathroom  Found by Staff-comment  Stated prior activity bathroom-unassisted  Provider Notification  Provider Name/Title farhan rashid md  Date Provider Notified 12/19/23  Time Provider Notified 1534  Method of Notification Page  Notification Reason Fall  Provider response No new orders  Date of Provider Response 12/19/23  Time of Provider Response 1535  Adult Fall Risk Assessment  Risk Factor Category (scoring not indicated) High fall risk per protocol (document High fall risk)  Patient Fall Risk Level High fall risk  Adult Fall Risk Interventions  Required Bundle Interventions *See Row Information* High fall risk - low, moderate, and high requirements implemented  Vitals  Temp 97.8 F (36.6 C)  Temp Source Oral  BP (!) 178/69  MAP (mmHg) 103  BP Location Right Arm  BP Method Automatic  Patient Position (if appropriate) Sitting  Pulse Rate 66  Pulse Rate Source Monitor  ECG Heart Rate 66  Resp 19  Oxygen Therapy  SpO2 99 %  O2 Device Room Air  PCA/Epidural/Spinal Assessment  Respiratory Pattern Regular  Neurological  Neuro (WDL) WDL  Level of Consciousness Alert  Glasgow Coma Scale  Eye Opening 4  Best Verbal Response (NON-intubated) 5  Best Motor Response 6  Glasgow Coma Scale Score 15  Musculoskeletal  Musculoskeletal (WDL) WDL  Integumentary  Integumentary (WDL) WDL

## 2023-12-19 NOTE — Progress Notes (Deleted)
   12/19/23 1516  What Happened  Was fall witnessed? No  Was patient injured? No  Patient found in bathroom  Found by Staff-comment  Stated prior activity bathroom-unassisted  Provider Notification  Provider Name/Title farhan rashid md  Date Provider Notified 12/19/23  Time Provider Notified 1534  Method of Notification Page  Notification Reason Fall  Provider response No new orders  Date of Provider Response 12/19/23  Time of Provider Response 1535  Adult Fall Risk Assessment  Risk Factor Category (scoring not indicated) High fall risk per protocol (document High fall risk)  Patient Fall Risk Level High fall risk  Adult Fall Risk Interventions  Required Bundle Interventions *See Row Information* High fall risk - low, moderate, and high requirements implemented  Vitals  Temp 97.8 F (36.6 C)  Temp Source Oral  BP (!) 178/69  MAP (mmHg) 103  BP Location Right Arm  BP Method Automatic  Patient Position (if appropriate) Sitting  Pulse Rate 66  Pulse Rate Source Monitor  ECG Heart Rate 66  Resp 19  Oxygen Therapy  SpO2 99 %  O2 Device Room Air  Neurological  Neuro (WDL) WDL  Level of Consciousness Alert  Musculoskeletal  Musculoskeletal (WDL) WDL  Integumentary  Integumentary (WDL) WDL

## 2023-12-19 NOTE — Progress Notes (Signed)
 Physical Therapy Treatment Patient Details Name: Brittany Acosta MRN: [id number] DOB: 1950/04/29 Today's Date: 12/19/2023   History of Present Illness Patient is a 73 y/o female admitted 12/08/23 due to SOB and CP.  Found to have moderate to large pericardial effusion, underwent pericardiocentesis with drain placement 12/09/23.  PMH positive for DM, HTN, psoriatic arthropathy, fibromyalgia, h/o gout, h/o breast CA, HLD.    PT Comments  Pt with decreased activity tolerance this afternoon, reporting frequent trips to the bathroom due to repeated diarrhea throughout the morning have worn her out. The pt was able to complete sit-stand and pivot with CGA and single UE support on bed rail to complete pivotal steps to bed, but the pt declined all offers of continued mobility and seated exercises due to fatigue. Will continue to follow and progress as tolerated as pt is hopeful to return to greater level of independence for return home.    If plan is discharge home, recommend the following: A little help with walking and/or transfers;Assist for transportation;A little help with bathing/dressing/bathroom;Assistance with cooking/housework;Help with stairs or ramp for entrance   Can travel by private vehicle        Equipment Recommendations  Rollator (4 wheels)    Recommendations for Other Services       Precautions / Restrictions Precautions Precautions: Fall Recall of Precautions/Restrictions: Intact Restrictions Weight Bearing Restrictions Per Provider Order: No     Mobility  Bed Mobility Overal bed mobility: Needs Assistance Bed Mobility: Sit to Supine       Sit to supine: Supervision   General bed mobility comments: increased time and use of bed features for pt to scoot up in bed, but very little physical assist    Transfers Overall transfer level: Needs assistance Equipment used: 1 person hand held assist Transfers: Sit to/from Stand, Bed to chair/wheelchair/BSC Sit to Stand: Contact  guard assist   Step pivot transfers: Contact guard assist       General transfer comment: pt reaching for UE support on bed, no buckling or LOB with pivotal steps, declined all further exercise or mobility due to fatigue from repeated trips to bathroom    Ambulation/Gait Ambulation/Gait assistance: Min assist Gait Distance (Feet): 3 Feet Assistive device: None Gait Pattern/deviations: Decreased stride length, Step-to pattern, Trunk flexed Gait velocity: slower     General Gait Details: small lateral steps from chair to bed, declined all further exercise or mobility due to fatigue from repeated trips to bathroom      Balance Overall balance assessment: Needs assistance Sitting-balance support: Feet supported Sitting balance-Leahy Scale: Good     Standing balance support: Single extremity supported, No upper extremity supported, During functional activity Standing balance-Leahy Scale: Fair (to poor, likely from days of inactivity.) Standing balance comment: periods of instability static standing at EOB, reaching for UE support                            Communication Communication Communication: No apparent difficulties  Cognition Arousal: Alert Behavior During Therapy: WFL for tasks assessed/performed   PT - Cognitive impairments: No apparent impairments                       PT - Cognition Comments: not formally tested, but [facility] in session Following commands: Intact      Cueing Cueing Techniques: Verbal cues  Exercises      General Comments General comments (skin integrity, edema, etc.): pt with  frequent episodes of diarrhea today, VSS on RA      Pertinent Vitals/Pain Pain Assessment Pain Assessment: No/denies pain Faces Pain Scale: No hurt Pain Intervention(s): Monitored during session     PT Goals (current goals can now be found in the care plan section) Acute Rehab PT Goals Patient Stated Goal: to rest PT Goal Formulation: With  patient/family Time For Goal Achievement: 12/26/23 Potential to Achieve Goals: Good Progress towards PT goals: Not progressing toward goals - comment (limited by fatigue)    Frequency    Min 3X/week       AM-PAC PT 6 Clicks Mobility   Outcome Measure  Help needed turning from your back to your side while in a flat bed without using bedrails?: A Little Help needed moving from lying on your back to sitting on the side of a flat bed without using bedrails?: A Little Help needed moving to and from a bed to a chair (including a wheelchair)?: A Little Help needed standing up from a chair using your arms (e.g., wheelchair or bedside chair)?: A Little Help needed to walk in hospital room?: A Little Help needed climbing 3-5 steps with a railing? : A Little 6 Click Score: 18    End of Session   Activity Tolerance: Patient limited by fatigue Patient left: in bed;with call bell/phone within reach;with bed alarm set Nurse Communication: Mobility status PT Visit Diagnosis: Other abnormalities of gait and mobility (R26.89)     Time: 8771-8757 PT Time Calculation (min) (ACUTE ONLY): 14 min  Charges:    $Therapeutic Activity: 8-22 mins PT General Charges $$ ACUTE PT VISIT: 1 Visit                     Izetta Call, PT, DPT   Acute Rehabilitation Department Office [phone] Secure Chat Communication Preferred   Izetta JULIANNA Call 12/19/2023, 12:58 PM

## 2023-12-19 NOTE — Progress Notes (Addendum)
 PROGRESS NOTE    Brittany Acosta  FMW:[id number] DOB: 30-Jul-1950 DOA: 12/08/2023 PCP: [name], MD   Brief Narrative:    73 y.o. female with a past medical history of diabetes mellitus type 2 on insulin , essential hypertension, Psoriatec arthropathy, history of gout, history of breast cancer, hyperlipidemia who was in her usual state of health till about 2 to 3 days ago when she started developing shortness of breath along with chest pain.  CT scan chest  which raise concern for moderate to large pericardial effusion. Case was discussed with cardiology who recommended hospitalization for further evaluation.  She was taken for OR for pericardiocentesis. She went into Afib with RVR on the night of 9/9, started on amiodarone  drip on 9/10. Cardiology on board. Converted to sinus rhythm now. Now having diarrhea and vomiting and feels tired. She is also having issues with poor oral intake and hypoglycemia.  Assessment & Plan:  Principal Problem:   Pericardial effusion Active Problems:   Hypothyroidism   Hypertension associated with diabetes (HCC)   Poorly controlled type 2 diabetes mellitus with circulatory disorder (HCC)   Gout, tophaceous   Psoriatic arthropathy (HCC)   Hyponatremia   Atrial flutter (HCC)   Acute idiopathic pericarditis    Afib/flutter with RVR: Developed on the night of 9/9. Started on amiodarone  drip after amio bolus on 9/10. Cardiology on board Continue tele monitoring. Converted to sinus rhythm. If patients goes back into Afib/Aflutter, she will need to be started on [facility] for stroke prophylaxis.  Pericardial effusion:  Unclear etiology, differential include uremic etiology, vs viral infection.  ESR is 55, CRP is 23 Echocardiogram showed moderate effusion, with JVD and symptomatic with sob and intermittent chest pain.  S/p pericardiocentesis with 9/5 with out. Pericardial fluid sent for analysis, it was bloody in appearance. Gram stain is negative and  cultures are pending.  Cytology was sent in view of her  history of breast cancer and is negative for malignancy. She was empirically started on colchicine  0.6 mg BID along with ibuprofen  600 mg TID. Colchicine  dced on 9/15 because of profound diarrhea. Repeat echo shows resolution of the pericardial effusion, drain removed by cardiology on 9/8.   Nausea, vomiting and diarrhea: Developed on 9/13.  Denies abdominal pain. No fever GI panel by PCR is negative Dced colchicine  on 9/15. No indication for C.diff testing at this point. Discussed that with Dr Luiz, ID.     Hypokalemia Prn repletion     Hyponatremia; secondary to SIADH/medications/Hypothyroidism. Sodium is 128 today. She had few episodes of emesis and diarrhea on 9/13.  TSH slightly high, free t4 elevated. Urine sodium is less than 30, and urine osmolality is 187. Serum osmo is 271. Am cortisol is 8.8  Suspect from medications , she remains asymptomatic.  Stopped zoloft  Since she underwent pericardiocentesis and almost 900 ml fluid drained Nephrology on board. F/u sodium levels daily Ordered NS at 50cc/hr x 10 hours on 9/14 because of hypovolemic hyponatremia.     Hypothyroidism:  Resumed synthroid . Abnormal thyroid  panel  with slightly elevated TSH and elevated free t4.  Recheck thyroid  panel in 4 weeks.      Type 2 DM with hypoglycemia:  Dced lantus  on 9/15 because of hypoglycemia. Patient has poor oral intake. Continue with SSI.     Hyperlipidemia Resumed pravastatin  20 mg daily.      Bipolar disorder Resumed Zoloft      Normocytic anemia Stable    Vitamin b12 deficiency/ Iron deficiency anemia  Replacements ordered.   Disposition: Lives with her husband. HHPT on discharge.     DVT prophylaxis: SCD's Start: 12/09/23 1559 SCDs Start: 12/08/23 2335     Code Status: Full Code Family Communication:  None at the bedside Status is: Inpatient Remains inpatient appropriate because: Afib with RVR, s/p  pericardiocentesis, hypoglycemia, diarrhea    Subjective:  She still feels exhausted from the diarrheal episodes. Appetite is not good ans she has been hypoglycemic. I discussed with her about starting soft diet and discontinuing her lantus .  Examination:  General exam: Appears tired and fatigued Respiratory system: Clear to auscultation. Respiratory effort normal. Cardiovascular system:sinus rhythm, normal heart rate,No pedal edema. Gastrointestinal system: Abdomen is nondistended, soft and nontender. No organomegaly or masses felt. Normal bowel sounds heard. Central nervous system: Alert and oriented. No focal neurological deficits. Extremities: Symmetric 5 x 5 power. Skin: No rashes, lesions or ulcers Psychiatry: Judgement and insight appear normal. Mood & affect appropriate.     Diet Orders (From admission, onward)     Start     Ordered   12/19/23 0853  DIET SOFT Room service appropriate? Yes; Fluid consistency: Thin  Diet effective now       Question Answer Comment  Room service appropriate? Yes   Fluid consistency: Thin      12/19/23 0852            Objective: Vitals:   12/18/23 2035 12/18/23 2331 12/19/23 0318 12/19/23 0726  BP: 138/61 (!) 150/68 139/69 (!) 155/71  Pulse: 72 70 68   Resp: 19 20 18 18   Temp: 98 F (36.7 C) 97.9 F (36.6 C) 98.2 F (36.8 C) 97.9 F (36.6 C)  TempSrc: Oral Oral Oral Oral  SpO2: 96% 97% 99% 98%  Weight:      Height:       No intake or output data in the 24 hours ending 12/19/23 0944  Filed Weights   12/08/23 1543 12/08/23 2303 12/13/23 0600  Weight: 97.1 kg 94.8 kg 97.4 kg    Scheduled Meds:  allopurinol   300 mg Oral Daily   amiodarone   200 mg Oral BID   [START ON 12/23/2023] amiodarone   200 mg Oral Daily   benzonatate   200 mg Oral BID   docusate sodium   100 mg Oral BID   doxepin   10 mg Oral Daily   ferrous sulfate   325 mg Oral Q breakfast   ibuprofen   600 mg Oral TID   insulin  aspart  0-15 Units Subcutaneous  TID WC   lamoTRIgine   200 mg Oral Q1200   levothyroxine   50 mcg Oral Daily   lisinopril   5 mg Oral Daily   metoprolol  tartrate  25 mg Oral BID   oxybutynin   10 mg Oral QHS   pantoprazole   40 mg Oral BID   pravastatin   20 mg Oral QHS   sodium chloride  flush  3 mL Intravenous Q12H   sodium chloride   1 g Oral BID WC   vitamin B-12  500 mcg Oral Daily   Continuous Infusions:    Nutritional status     Body mass index is 39.92 kg/m.  Data Reviewed:   CBC: Recent Labs  Lab 12/13/23 0142 12/14/23 0222 12/16/23 0438 12/17/23 0908 12/18/23 0417  WBC 14.5* 12.4* 13.3* 12.2* 11.0*  NEUTROABS 10.3* 8.0* 7.8* 8.9* 7.3  HGB 8.8* 8.1* 8.9* 9.1* 9.9*  HCT 26.0* 24.5* 27.0* 27.3* 30.2*  MCV 82.8 83.1 83.9 82.7 84.6  PLT 617* 592* 838* 793* 754*   Basic  Metabolic Panel: Recent Labs  Lab 12/14/23 0221 12/14/23 0222 12/15/23 0441 12/16/23 0438 12/17/23 0908 12/18/23 0417 12/19/23 0535  NA  --  130* 133* 133* 128* 128* 131*  K  --  3.5 4.3 4.2 3.7 3.4* 4.1  CL  --  96* 97* 98 93* 94* 97*  CO2  --  22 22 22 22  19* 22  GLUCOSE  --  156* 145* 122* 149* 77 67*  BUN  --  10 9 8  7* 8 7*  CREATININE  --  0.70 0.68 0.89 0.72 0.82 0.85  CALCIUM   --  8.2* 8.5* 8.5* 8.5* 8.6* 8.7*  MG 1.5*  --   --   --   --   --  1.5*  PHOS  --  4.0  --   --   --   --   --    GFR: Estimated Creatinine Clearance: 63.7 mL/min (by C-G formula based on SCr of 0.85 mg/dL). Liver Function Tests: Recent Labs  Lab 12/14/23 0222  ALBUMIN  2.2*   No results for input(s): LIPASE, AMYLASE in the last 168 hours. No results for input(s): AMMONIA in the last 168 hours. Coagulation Profile: No results for input(s): INR, PROTIME in the last 168 hours. Cardiac Enzymes: No results for input(s): CKTOTAL, CKMB, CKMBINDEX, TROPONINI in the last 168 hours. BNP (last 3 results) Recent Labs    12/08/23 1700  PROBNP 725.0*   HbA1C: No results for input(s): HGBA1C in the last 72  hours. CBG: Recent Labs  Lab 12/18/23 1605 12/18/23 2030 12/19/23 0730 12/19/23 0758 12/19/23 0814  GLUCAP 102* 123* 63* 45* 114*   Lipid Profile: No results for input(s): CHOL, HDL, LDLCALC, TRIG, CHOLHDL, LDLDIRECT in the last 72 hours. Thyroid  Function Tests: No results for input(s): TSH, T4TOTAL, FREET4, T3FREE, THYROIDAB in the last 72 hours. Anemia Panel: No results for input(s): VITAMINB12, FOLATE, FERRITIN, TIBC, IRON, RETICCTPCT in the last 72 hours. Sepsis Labs: Recent Labs  Lab 12/14/23 0221  PROCALCITON <0.10    Recent Results (from the past 240 hours)  Body fluid culture w Gram Stain     Status: None   Collection Time: 12/09/23  3:04 PM   Specimen: PATH Cytology Misc. fluid; Body Fluid  Result Value Ref Range Status   Specimen Description PERICARDIAL  Final   Special Requests NONE  Final   Gram Stain   Final    FEW WBC PRESENT, PREDOMINANTLY MONONUCLEAR NO ORGANISMS SEEN    Culture   Final    NO GROWTH 3 DAYS Performed at [facility], [address] [place], [place] [address]    Report Status 12/12/2023 FINAL  Final  Respiratory (~20 pathogens) panel by PCR     Status: None   Collection Time: 12/11/23  4:02 PM   Specimen: Nasopharyngeal Swab; Respiratory  Result Value Ref Range Status   Adenovirus NOT DETECTED NOT DETECTED Final   Coronavirus 229E NOT DETECTED NOT DETECTED Final    Comment: (NOTE) The Coronavirus on the Respiratory Panel, DOES NOT test for the novel  Coronavirus (2019 nCoV)    Coronavirus HKU1 NOT DETECTED NOT DETECTED Final   Coronavirus NL63 NOT DETECTED NOT DETECTED Final   Coronavirus OC43 NOT DETECTED NOT DETECTED Final   Metapneumovirus NOT DETECTED NOT DETECTED Final   Rhinovirus / Enterovirus NOT DETECTED NOT DETECTED Final   Influenza A NOT DETECTED NOT DETECTED Final   Influenza B NOT DETECTED NOT DETECTED Final   Parainfluenza Virus 1 NOT DETECTED NOT DETECTED  Final    Parainfluenza Virus 2 NOT DETECTED NOT DETECTED Final   Parainfluenza Virus 3 NOT DETECTED NOT DETECTED Final   Parainfluenza Virus 4 NOT DETECTED NOT DETECTED Final   Respiratory Syncytial Virus NOT DETECTED NOT DETECTED Final   Bordetella pertussis NOT DETECTED NOT DETECTED Final   Bordetella Parapertussis NOT DETECTED NOT DETECTED Final   Chlamydophila pneumoniae NOT DETECTED NOT DETECTED Final   Mycoplasma pneumoniae NOT DETECTED NOT DETECTED Final    Comment: Performed at [facility], [address], [place], [place] [address]  Gastrointestinal Panel by PCR , Stool     Status: None   Collection Time: 12/18/23  9:41 AM   Specimen: Stool  Result Value Ref Range Status   Campylobacter species NOT DETECTED NOT DETECTED Final   Plesimonas shigelloides NOT DETECTED NOT DETECTED Final   Salmonella species NOT DETECTED NOT DETECTED Final   Yersinia enterocolitica NOT DETECTED NOT DETECTED Final   Vibrio species NOT DETECTED NOT DETECTED Final   Vibrio cholerae NOT DETECTED NOT DETECTED Final   Enteroaggregative E coli (EAEC) NOT DETECTED NOT DETECTED Final   Enteropathogenic E coli (EPEC) NOT DETECTED NOT DETECTED Final   Enterotoxigenic E coli (ETEC) NOT DETECTED NOT DETECTED Final   Shiga like toxin producing E coli (STEC) NOT DETECTED NOT DETECTED Final   Shigella/Enteroinvasive E coli (EIEC) NOT DETECTED NOT DETECTED Final   Cryptosporidium NOT DETECTED NOT DETECTED Final   Cyclospora cayetanensis NOT DETECTED NOT DETECTED Final   Entamoeba histolytica NOT DETECTED NOT DETECTED Final   Giardia lamblia NOT DETECTED NOT DETECTED Final   Adenovirus F40/41 NOT DETECTED NOT DETECTED Final   Astrovirus NOT DETECTED NOT DETECTED Final   Norovirus GI/GII NOT DETECTED NOT DETECTED Final   Rotavirus A NOT DETECTED NOT DETECTED Final   Sapovirus (I, II, IV, and V) NOT DETECTED NOT DETECTED Final    Comment: Performed at [facility], [address] [place], [place] [address]          Radiology Studies: DG CHEST PORT 1 VIEW Result Date: 12/17/2023 CLINICAL DATA:  Dyspnea EXAM: PORTABLE CHEST 1 VIEW COMPARISON:  12/13/2023 FINDINGS: Single frontal view of the chest demonstrates mild enlargement of the cardiac silhouette. No airspace disease, effusion, or pneumothorax. Stable right shoulder arthroplasty. No acute fractures. IMPRESSION: 1. Mild enlargement of the cardiac silhouette. 2. No acute airspace disease. Electronically Signed   By: Ozell Daring M.D.   On: 12/17/2023 14:52        LOS: 11 days   Time spent= 41 mins    Deliliah Room, MD [facility]  If 7PM-7AM, please contact night-coverage  12/19/2023, 9:44 AM

## 2023-12-19 NOTE — Plan of Care (Signed)

## 2023-12-20 DIAGNOSIS — I3139 Other pericardial effusion (noninflammatory): Secondary | ICD-10-CM | POA: Diagnosis not present

## 2023-12-20 LAB — CBC WITH DIFFERENTIAL/PLATELET
Abs Immature Granulocytes: 0.28 K/uL — ABNORMAL HIGH (ref 0.00–0.07)
Basophils Absolute: 0.1 K/uL (ref 0.0–0.1)
Basophils Relative: 0 %
Eosinophils Absolute: 0.4 K/uL (ref 0.0–0.5)
Eosinophils Relative: 3 %
HCT: 29.8 % — ABNORMAL LOW (ref 36.0–46.0)
Hemoglobin: 9.7 g/dL — ABNORMAL LOW (ref 12.0–15.0)
Immature Granulocytes: 2 %
Lymphocytes Relative: 11 %
Lymphs Abs: 1.3 K/uL (ref 0.7–4.0)
MCH: 27.8 pg (ref 26.0–34.0)
MCHC: 32.6 g/dL (ref 30.0–36.0)
MCV: 85.4 fL (ref 80.0–100.0)
Monocytes Absolute: 0.8 K/uL (ref 0.1–1.0)
Monocytes Relative: 7 %
Neutro Abs: 8.8 K/uL — ABNORMAL HIGH (ref 1.7–7.7)
Neutrophils Relative %: 77 %
Platelets: 668 K/uL — ABNORMAL HIGH (ref 150–400)
RBC: 3.49 MIL/uL — ABNORMAL LOW (ref 3.87–5.11)
RDW: 15.2 % (ref 11.5–15.5)
WBC: 11.5 K/uL — ABNORMAL HIGH (ref 4.0–10.5)
nRBC: 0 % (ref 0.0–0.2)

## 2023-12-20 LAB — BASIC METABOLIC PANEL WITH GFR
Anion gap: 13 (ref 5–15)
BUN: 8 mg/dL (ref 8–23)
CO2: 18 mmol/L — ABNORMAL LOW (ref 22–32)
Calcium: 8.8 mg/dL — ABNORMAL LOW (ref 8.9–10.3)
Chloride: 97 mmol/L — ABNORMAL LOW (ref 98–111)
Creatinine, Ser: 0.9 mg/dL (ref 0.44–1.00)
GFR, Estimated: >60 mL/min (ref >60)
Glucose, Bld: 116 mg/dL — ABNORMAL HIGH (ref 70–99)
Potassium: 4.5 mmol/L (ref 3.5–5.1)
Sodium: 128 mmol/L — ABNORMAL LOW (ref 135–145)

## 2023-12-20 LAB — GLUCOSE, CAPILLARY
Glucose-Capillary: 71 mg/dL (ref 70–99)
Glucose-Capillary: 84 mg/dL (ref 70–99)
Glucose-Capillary: 63 mg/dL — ABNORMAL LOW (ref 70–99)
Glucose-Capillary: 114 mg/dL — ABNORMAL HIGH (ref 70–99)
Glucose-Capillary: 126 mg/dL — ABNORMAL HIGH (ref 70–99)
Glucose-Capillary: 108 mg/dL — ABNORMAL HIGH (ref 70–99)
Glucose-Capillary: 131 mg/dL — ABNORMAL HIGH (ref 70–99)

## 2023-12-20 NOTE — Care Management Important Message (Signed)
 Important Message  Patient Details  Name: Brittany Acosta MRN: [id number] Date of Birth: 08/13/1950   Important Message Given:  Yes - Medicare IM     [name] 12/20/2023, 11:19 AM

## 2023-12-20 NOTE — Progress Notes (Signed)
 Suspect glucose finger sticks to be inaccurate. Lab at bedside now to draw BMP. Will assess glucose value on BMP results.

## 2023-12-20 NOTE — Progress Notes (Signed)
 Held Metoprolol  morning dose due to HR being 53.

## 2023-12-20 NOTE — Progress Notes (Signed)
 PROGRESS NOTE    Brittany Acosta  FMW:[id number] DOB: 1950-07-13 DOA: 12/08/2023 PCP: [name], MD   Brief Narrative:    73 y.o. female with a past medical history of diabetes mellitus type 2 on insulin , essential hypertension, Psoriatec arthropathy, history of gout, history of breast cancer, hyperlipidemia who was in her usual state of health till about 2 to 3 days ago when she started developing shortness of breath along with chest pain.  CT scan chest  which raise concern for moderate to large pericardial effusion. Case was discussed with cardiology who recommended hospitalization for further evaluation.  She was taken for OR for pericardiocentesis. She went into Afib with RVR on the night of 9/9, started on amiodarone  drip on 9/10. Cardiology on board. Converted to sinus rhythm now. Now having diarrhea and vomiting and feels tired. She is also having issues with poor oral intake and hypoglycemia. Dced colchicine  on 9/15. She is from home and lives with her husband.  Assessment & Plan:  Principal Problem:   Pericardial effusion Active Problems:   Hypothyroidism   Hypertension associated with diabetes (HCC)   Poorly controlled type 2 diabetes mellitus with circulatory disorder (HCC)   Gout, tophaceous   Psoriatic arthropathy (HCC)   Hyponatremia   Atrial flutter (HCC)   Acute idiopathic pericarditis    Afib/flutter with RVR: Developed on the night of 9/9. Started on amiodarone  drip after amio bolus on 9/10. Cardiology on board Continue tele monitoring. Converted to sinus rhythm. If patients goes back into Afib/Aflutter, she will need to be started on [facility] for stroke prophylaxis.  Pericardial effusion:  Unclear etiology, differential include uremic etiology, vs viral infection.  ESR is 55, CRP is 23 Echocardiogram showed moderate effusion, with JVD and symptomatic with sob and intermittent chest pain.  S/p pericardiocentesis with 9/5 with out. Pericardial fluid sent for  analysis, it was bloody in appearance. Gram stain is negative and cultures are pending.  Cytology was sent in view of her  history of breast cancer and is negative for malignancy. She was empirically started on colchicine  0.6 mg BID along with ibuprofen  600 mg TID. Colchicine  dced on 9/15 because of profound diarrhea. Repeat echo shows resolution of the pericardial effusion, drain removed by cardiology on 9/8.   Nausea, vomiting and diarrhea: Developed on 9/13.  Only active issue with diarrhea now. Denies abdominal pain. No fever GI panel by PCR is negative Dced colchicine  on 9/15. No indication for C.diff testing at this point. Discussed that with Dr Luiz, ID on 9/15.     Hypokalemia Prn repletion     Hyponatremia; secondary to SIADH/medications/Hypothyroidism. Sodium is 128 today. She had few episodes of emesis and diarrhea on 9/13.  TSH slightly high, free t4 elevated. Urine sodium is less than 30, and urine osmolality is 187. Serum osmo is 271. Am cortisol is 8.8  Suspect from medications , she remains asymptomatic.  Stopped zoloft  Since she underwent pericardiocentesis and almost 900 ml fluid drained Nephrology on board. F/u sodium levels daily Ordered NS at 50cc/hr x 10 hours on 9/14 because of hypovolemic hyponatremia.     Hypothyroidism:  Resumed synthroid . Abnormal thyroid  panel  with slightly elevated TSH and elevated free t4.  Recheck thyroid  panel in 4 weeks.      Type 2 DM with hypoglycemia:  Dced lantus  on 9/15 because of hypoglycemia. Patient has poor oral intake. Continue with SSI. May need dextrose  infusion.     Hyperlipidemia Resumed pravastatin  20 mg daily.  Bipolar disorder Resumed Zoloft      Normocytic anemia Stable    Vitamin b12 deficiency/ Iron deficiency anemia Replacements ordered.   Disposition: Lives with her husband. HHPT on discharge.     DVT prophylaxis: SCD's Start: 12/09/23 1559 SCDs Start: 12/08/23 2335     Code  Status: Full Code Family Communication:  Husband at the bedside Status is: Inpatient Remains inpatient appropriate because: Afib with RVR, s/p pericardiocentesis, hypoglycemia, diarrhea    Subjective:  She is still having diarrhea but feels like her stools are more formed now. She did fell yesterday in the bathroom but didn't suffer any injuries. She didn't pass out but felt weak in her legs. Her glucose is also low because of poor oral intake.  Examination:  General exam: Appears tired and fatigued Respiratory system: Clear to auscultation. Respiratory effort normal. Cardiovascular system:sinus rhythm, normal heart rate,No pedal edema. Gastrointestinal system: Abdomen is nondistended, soft and nontender. No organomegaly or masses felt. Normal bowel sounds heard. Central nervous system: Alert and oriented. No focal neurological deficits. Extremities: Symmetric 5 x 5 power. Skin: No rashes, lesions or ulcers Psychiatry: Judgement and insight appear normal. Mood & affect appropriate.     Diet Orders (From admission, onward)     Start     Ordered   12/19/23 0853  DIET SOFT Room service appropriate? Yes; Fluid consistency: Thin  Diet effective now       Question Answer Comment  Room service appropriate? Yes   Fluid consistency: Thin      12/19/23 0852            Objective: Vitals:   12/19/23 2335 12/20/23 0436 12/20/23 0437 12/20/23 0744  BP: 133/63 (!) 137/96  139/66  Pulse: (!) 59 (!) 57 (!) 55 (!) 53  Resp: 18 18  18   Temp: 98 F (36.7 C) 97.7 F (36.5 C)  98.1 F (36.7 C)  TempSrc: Oral Oral  Oral  SpO2: 96% 100% 100% 98%  Weight:      Height:        Intake/Output Summary (Last 24 hours) at 12/20/2023 1030 Last data filed at 12/20/2023 0037 Gross per 24 hour  Intake 360 ml  Output --  Net 360 ml    Filed Weights   12/08/23 1543 12/08/23 2303 12/13/23 0600  Weight: 97.1 kg 94.8 kg 97.4 kg    Scheduled Meds:  allopurinol   300 mg Oral Daily    amiodarone   200 mg Oral BID   [START ON 12/23/2023] amiodarone   200 mg Oral Daily   benzonatate   200 mg Oral BID   docusate sodium   100 mg Oral BID   doxepin   10 mg Oral Daily   ferrous sulfate   325 mg Oral Q breakfast   ibuprofen   600 mg Oral TID   insulin  aspart  0-15 Units Subcutaneous TID WC   lamoTRIgine   200 mg Oral Q1200   levothyroxine   50 mcg Oral Daily   lisinopril   5 mg Oral Daily   metoprolol  tartrate  25 mg Oral BID   oxybutynin   10 mg Oral QHS   pantoprazole   40 mg Oral BID   pravastatin   20 mg Oral QHS   sodium chloride  flush  3 mL Intravenous Q12H   sodium chloride   1 g Oral BID WC   vitamin B-12  500 mcg Oral Daily   Continuous Infusions:    Nutritional status     Body mass index is 39.92 kg/m.  Data Reviewed:   CBC: Recent Labs  Lab 12/14/23 0222 12/16/23 0438 12/17/23 0908 12/18/23 0417  WBC 12.4* 13.3* 12.2* 11.0*  NEUTROABS 8.0* 7.8* 8.9* 7.3  HGB 8.1* 8.9* 9.1* 9.9*  HCT 24.5* 27.0* 27.3* 30.2*  MCV 83.1 83.9 82.7 84.6  PLT 592* 838* 793* 754*   Basic Metabolic Panel: Recent Labs  Lab 12/13/23 1523 12/14/23 0221 12/14/23 0222 12/15/23 0441 12/16/23 0438 12/17/23 0908 12/18/23 0417 12/19/23 0535  NA  --   --  130* 133* 133* 128* 128* 131*  K   < >  --  3.5 4.3 4.2 3.7 3.4* 4.1  CL   < >  --  96* 97* 98 93* 94* 97*  CO2   < >  --  22 22 22 22  19* 22  GLUCOSE   < >  --  156* 145* 122* 149* 77 67*  BUN   < >  --  10 9 8  7* 8 7*  CREATININE   < >  --  0.70 0.68 0.89 0.72 0.82 0.85  CALCIUM    < >  --  8.2* 8.5* 8.5* 8.5* 8.6* 8.7*  MG  --  1.5*  --   --   --   --   --  1.5*  PHOS  --   --  4.0  --   --   --   --   --    < > = values in this interval not displayed.   GFR: Estimated Creatinine Clearance: 63.7 mL/min (by C-G formula based on SCr of 0.85 mg/dL). Liver Function Tests: Recent Labs  Lab 12/14/23 0222  ALBUMIN  2.2*   No results for input(s): LIPASE, AMYLASE in the last 168 hours. No results for input(s):  AMMONIA in the last 168 hours. Coagulation Profile: No results for input(s): INR, PROTIME in the last 168 hours. Cardiac Enzymes: No results for input(s): CKTOTAL, CKMB, CKMBINDEX, TROPONINI in the last 168 hours. BNP (last 3 results) Recent Labs    12/08/23 1700  PROBNP 725.0*   HbA1C: No results for input(s): HGBA1C in the last 72 hours. CBG: Recent Labs  Lab 12/19/23 1711 12/19/23 2047 12/20/23 0758 12/20/23 0823 12/20/23 0849  GLUCAP 86 99 84 71 63*   Lipid Profile: No results for input(s): CHOL, HDL, LDLCALC, TRIG, CHOLHDL, LDLDIRECT in the last 72 hours. Thyroid  Function Tests: No results for input(s): TSH, T4TOTAL, FREET4, T3FREE, THYROIDAB in the last 72 hours. Anemia Panel: No results for input(s): VITAMINB12, FOLATE, FERRITIN, TIBC, IRON, RETICCTPCT in the last 72 hours. Sepsis Labs: Recent Labs  Lab 12/14/23 0221  PROCALCITON <0.10    Recent Results (from the past 240 hours)  Respiratory (~20 pathogens) panel by PCR     Status: None   Collection Time: 12/11/23  4:02 PM   Specimen: Nasopharyngeal Swab; Respiratory  Result Value Ref Range Status   Adenovirus NOT DETECTED NOT DETECTED Final   Coronavirus 229E NOT DETECTED NOT DETECTED Final    Comment: (NOTE) The Coronavirus on the Respiratory Panel, DOES NOT test for the novel  Coronavirus (2019 nCoV)    Coronavirus HKU1 NOT DETECTED NOT DETECTED Final   Coronavirus NL63 NOT DETECTED NOT DETECTED Final   Coronavirus OC43 NOT DETECTED NOT DETECTED Final   Metapneumovirus NOT DETECTED NOT DETECTED Final   Rhinovirus / Enterovirus NOT DETECTED NOT DETECTED Final   Influenza A NOT DETECTED NOT DETECTED Final   Influenza B NOT DETECTED NOT DETECTED Final   Parainfluenza Virus 1 NOT DETECTED NOT DETECTED Final   Parainfluenza Virus 2  NOT DETECTED NOT DETECTED Final   Parainfluenza Virus 3 NOT DETECTED NOT DETECTED Final   Parainfluenza Virus 4 NOT DETECTED  NOT DETECTED Final   Respiratory Syncytial Virus NOT DETECTED NOT DETECTED Final   Bordetella pertussis NOT DETECTED NOT DETECTED Final   Bordetella Parapertussis NOT DETECTED NOT DETECTED Final   Chlamydophila pneumoniae NOT DETECTED NOT DETECTED Final   Mycoplasma pneumoniae NOT DETECTED NOT DETECTED Final    Comment: Performed at [facility], [address], [place], [place] [address]  Gastrointestinal Panel by PCR , Stool     Status: None   Collection Time: 12/18/23  9:41 AM   Specimen: Stool  Result Value Ref Range Status   Campylobacter species NOT DETECTED NOT DETECTED Final   Plesimonas shigelloides NOT DETECTED NOT DETECTED Final   Salmonella species NOT DETECTED NOT DETECTED Final   Yersinia enterocolitica NOT DETECTED NOT DETECTED Final   Vibrio species NOT DETECTED NOT DETECTED Final   Vibrio cholerae NOT DETECTED NOT DETECTED Final   Enteroaggregative E coli (EAEC) NOT DETECTED NOT DETECTED Final   Enteropathogenic E coli (EPEC) NOT DETECTED NOT DETECTED Final   Enterotoxigenic E coli (ETEC) NOT DETECTED NOT DETECTED Final   Shiga like toxin producing E coli (STEC) NOT DETECTED NOT DETECTED Final   Shigella/Enteroinvasive E coli (EIEC) NOT DETECTED NOT DETECTED Final   Cryptosporidium NOT DETECTED NOT DETECTED Final   Cyclospora cayetanensis NOT DETECTED NOT DETECTED Final   Entamoeba histolytica NOT DETECTED NOT DETECTED Final   Giardia lamblia NOT DETECTED NOT DETECTED Final   Adenovirus F40/41 NOT DETECTED NOT DETECTED Final   Astrovirus NOT DETECTED NOT DETECTED Final   Norovirus GI/GII NOT DETECTED NOT DETECTED Final   Rotavirus A NOT DETECTED NOT DETECTED Final   Sapovirus (I, II, IV, and V) NOT DETECTED NOT DETECTED Final    Comment: Performed at [facility], [address], [place], [place] [address]       Radiology Studies: No results found.     LOS: 12 days   Time spent= 41 mins    Deliliah Room, MD [facility]  If  7PM-7AM, please contact night-coverage  12/20/2023, 10:30 AM

## 2023-12-20 NOTE — Plan of Care (Signed)
 Atrial fibrillation/atrial flutter with RVR-resolved Patient's RN reported that heart rate around upper 50s throughout the day and remained between 56-58.  Patient is already on amiodarone .  Holding Lopressor  in the setting of persistent bradycardia

## 2023-12-20 NOTE — Plan of Care (Signed)
  Problem: Education: Goal: Knowledge of General Education information will improve Description: Including pain rating scale, medication(s)/side effects and non-pharmacologic comfort measures Outcome: Progressing   Problem: Health Behavior/Discharge Planning: Goal: Ability to manage health-related needs will improve Outcome: Progressing   Problem: Clinical Measurements: Goal: Ability to maintain clinical measurements within normal limits will improve Outcome: Progressing   Problem: Activity: Goal: Risk for activity intolerance will decrease Outcome: Progressing   Problem: Nutrition: Goal: Adequate nutrition will be maintained Outcome: Progressing   Problem: Coping: Goal: Level of anxiety will decrease Outcome: Progressing   Problem: Pain Managment: Goal: General experience of comfort will improve and/or be controlled Outcome: Progressing   Problem: Safety: Goal: Ability to remain free from injury will improve Outcome: Progressing   Problem: Skin Integrity: Goal: Risk for impaired skin integrity will decrease Outcome: Progressing   Problem: Coping: Goal: Ability to adjust to condition or change in health will improve Outcome: Progressing   Problem: Nutritional: Goal: Maintenance of adequate nutrition will improve Outcome: Progressing   Problem: Tissue Perfusion: Goal: Adequacy of tissue perfusion will improve Outcome: Progressing

## 2023-12-20 NOTE — Progress Notes (Signed)
 PT Cancellation Note  Patient Details Name: Brittany Acosta MRN: [id number] DOB: 13-Oct-1950   Cancelled Treatment:    Reason Eval/Treat Not Completed: (P) Other (comment) (pt just started eating dinner) Will continue efforts per PT plan of care as schedule permits.   Connell HERO [name] 12/20/2023, 5:50 PM

## 2023-12-20 NOTE — TOC Progression Note (Signed)
 Transition of Care [facility]) - Progression Note    Patient Details  Name: Brittany Acosta MRN: [id number] Date of Birth: [birth year]  Transition of [facility]) CM/SW Contact  Graves-Bigelow, Erminio Deems, RN Phone Number: 12/20/2023, 12:16 PM  Clinical Narrative:  Inpatient Case Manager continues to follow for additional disposition needs. Patient will have home health PT/OT with [facility]- orders and F2F needed. Inpatient Case Manager will continue to follow for additional needs.   Expected Discharge Plan: Home w Home Health Services Barriers to Discharge: No Barriers Identified  Expected Discharge Plan and Services In-house Referral: NA Discharge Planning Services: CM Consult Post Acute Care Choice: Home Health Living arrangements for the past 2 months: Single Family Home                 DME Arranged: Walker rolling with seat DME Agency: [facility] Date DME Agency Contacted: 12/13/23 Time DME Agency Contacted: [phone] Representative spoke with at DME Agency: [facility] HH Arranged: PT, OT [facility] Agency: [facility] Date [facility] Agency Contacted: 12/16/23 Time HH Agency Contacted: 1600 Representative spoke with at [facility] Agency: Amy Social Drivers of Health (SDOH) Interventions SDOH Screenings   Food Insecurity: No Food Insecurity (12/08/2023)  Housing: Low Risk  (12/08/2023)  Transportation Needs: No Transportation Needs (12/08/2023)  Utilities: Not At Risk (12/08/2023)  Depression (PHQ2-9): Low Risk  (11/22/2023)  Financial Resource Strain: Low Risk  (09/05/2019)   Received from [facility]  Physical Activity: Unknown (09/05/2019)   Received from [facility]  Social Connections: Moderately Isolated (12/08/2023)  Stress: No Stress Concern Present (09/05/2019)   Received from [facility]  Tobacco Use: Medium Risk (12/09/2023)   Readmission Risk Interventions     No data to display

## 2023-12-20 NOTE — Inpatient Diabetes Management (Signed)
 Inpatient Diabetes Program Recommendations  AACE/ADA: New Consensus Statement on Inpatient Glycemic Control (2015)  Target Ranges:  Prepandial:   less than 140 mg/dL      Peak postprandial:   less than 180 mg/dL (1-2 hours)      Critically ill patients:  140 - 180 mg/dL   Lab Results  Component Value Date   GLUCAP 114 (H) 12/20/2023   HGBA1C 5.6 12/09/2023    Review of Glycemic Control  Latest Reference Range & Units 12/20/23 08:23 12/20/23 08:49 12/20/23 11:18  Glucose-Capillary 70 - 99 mg/dL 71 63 (L) 885 (H)   Diabetes history: DM Outpatient Diabetes medications:  Tresiba  23 units daily Metformin  1000 mg q AM and 500 mg q PM Current orders for Inpatient glycemic control:  Novolog  0-15 units tid with meals   Inpatient Diabetes Program Recommendations:    Consider reducing Novolog  correction to 0-6 units tid with meals.   Intake reduced.   Thanks,  Randall Bullocks, RN, BC-ADM Inpatient Diabetes Coordinator Pager [phone]  (8a-5p)

## 2023-12-21 ENCOUNTER — Inpatient Hospital Stay (HOSPITAL_COMMUNITY)

## 2023-12-21 DIAGNOSIS — Z96611 Presence of right artificial shoulder joint: Secondary | ICD-10-CM | POA: Diagnosis not present

## 2023-12-21 DIAGNOSIS — R918 Other nonspecific abnormal finding of lung field: Secondary | ICD-10-CM | POA: Diagnosis not present

## 2023-12-21 DIAGNOSIS — I3139 Other pericardial effusion (noninflammatory): Secondary | ICD-10-CM | POA: Diagnosis not present

## 2023-12-21 DIAGNOSIS — R072 Precordial pain: Secondary | ICD-10-CM

## 2023-12-21 DIAGNOSIS — R0602 Shortness of breath: Secondary | ICD-10-CM | POA: Diagnosis not present

## 2023-12-21 DIAGNOSIS — J90 Pleural effusion, not elsewhere classified: Secondary | ICD-10-CM | POA: Diagnosis not present

## 2023-12-21 LAB — BASIC METABOLIC PANEL WITH GFR
Anion gap: 15 (ref 5–15)
BUN: 8 mg/dL (ref 8–23)
CO2: 21 mmol/L — ABNORMAL LOW (ref 22–32)
Calcium: 8.7 mg/dL — ABNORMAL LOW (ref 8.9–10.3)
Chloride: 91 mmol/L — ABNORMAL LOW (ref 98–111)
Creatinine, Ser: 1.02 mg/dL — ABNORMAL HIGH (ref 0.44–1.00)
GFR, Estimated: 58 mL/min — ABNORMAL LOW (ref >60)
Glucose, Bld: 110 mg/dL — ABNORMAL HIGH (ref 70–99)
Potassium: 4.1 mmol/L (ref 3.5–5.1)
Sodium: 127 mmol/L — ABNORMAL LOW (ref 135–145)

## 2023-12-21 LAB — GLUCOSE, CAPILLARY
Glucose-Capillary: 103 mg/dL — ABNORMAL HIGH (ref 70–99)
Glucose-Capillary: 142 mg/dL — ABNORMAL HIGH (ref 70–99)
Glucose-Capillary: 142 mg/dL — ABNORMAL HIGH (ref 70–99)

## 2023-12-21 LAB — OSMOLALITY: Osmolality: 269 mosm/kg — ABNORMAL LOW (ref 275–295)

## 2023-12-21 LAB — SODIUM, URINE, RANDOM: Sodium, Ur: 53 mmol/L

## 2023-12-21 LAB — OSMOLALITY, URINE: Osmolality, Ur: 177 mosm/kg — ABNORMAL LOW (ref 300–900)

## 2023-12-21 MED ORDER — FUROSEMIDE 10 MG/ML IJ SOLN
40.0000 mg | Freq: Once | INTRAMUSCULAR | Status: AC
  Administered 2023-12-21: 40 mg via INTRAVENOUS
  Filled 2023-12-21: qty 4

## 2023-12-21 MED ORDER — GUAIFENESIN 100 MG/5ML PO LIQD: 15.0000 mL | ORAL | Status: DC | PRN

## 2023-12-21 MED ORDER — GUAIFENESIN 100 MG/5ML PO LIQD
15.0000 mL | ORAL | Status: DC
Start: 2023-12-21 — End: 2023-12-24
  Administered 2023-12-21 – 2023-12-24 (×21): 15 mL via ORAL
  Filled 2023-12-21 (×21): qty 20

## 2023-12-21 NOTE — Progress Notes (Signed)
 Progress Note   Patient: Brittany Acosta FMW:[id number] DOB: 1950/12/18 DOA: 12/08/2023     13 DOS: the patient was seen and examined on 12/21/2023   Brief hospital course: 73 y.o. female with a past medical history of diabetes mellitus type 2 on insulin , essential hypertension, Psoriatec arthropathy, history of gout, history of breast cancer, hyperlipidemia who was in her usual state of health till about 2 to 3 days ago when she started developing shortness of breath along with chest pain.  CT scan chest  which raise concern for moderate to large pericardial effusion. Case was discussed with cardiology who recommended hospitalization for further evaluation.  She was taken for OR for pericardiocentesis. She went into Afib with RVR on the night of 9/9, started on amiodarone  drip on 9/10. Cardiology on board. Converted to sinus rhythm now. Now having diarrhea and vomiting and feels tired. She is also having issues with poor oral intake and hypoglycemia. Dced colchicine  on 9/15. She is from home and lives with her husband.  Assessment and Plan: Afib/flutter with RVR: Developed on the night of 9/9. Started on amiodarone  drip after amio bolus on 9/10. Cardiology on board Continue tele monitoring. Converted to sinus rhythm. If patients goes back into Afib/Aflutter, she will need to be started on [facility] for stroke prophylaxis.   Pericardial effusion:  Unclear etiology, differential include uremic etiology, vs viral infection.  ESR is 55, CRP is 23 Echocardiogram showed moderate effusion, with JVD and symptomatic with sob and intermittent chest pain.  S/p pericardiocentesis with 9/5 with out. Pericardial fluid sent for analysis, it was bloody in appearance. Gram stain is negative and cultures are pending.  Cytology was sent in view of her  history of breast cancer and is negative for malignancy. She was empirically started on colchicine  0.6 mg BID along with ibuprofen  600 mg TID. Colchicine  dced on 9/15  because of profound diarrhea. Repeat echo shows resolution of the pericardial effusion, drain removed by cardiology on 9/8.    Nausea, vomiting and diarrhea: Developed on 9/13.   -diarrhea improved Denies abdominal pain. No fever GI panel by PCR is negative Dced colchicine  on 9/15. No indication for C.diff testing at this point. Dr. Dino discussed that with Dr Luiz, ID on 9/15.    Hypokalemia Prn repletion    Hyponatremia; secondary to SIADH/medications/Hypothyroidism. Sodium is 128 today. She had few episodes of emesis and diarrhea on 9/13.  TSH slightly high, free t4 elevated. Urine sodium is less than 30, and urine osmolality is 187. Serum osmo is 271. Am cortisol is 8.8  Stopped zoloft  Clinically appears overloaded. CXR reviewed, suggestive of vol overload Thus far over 1300cc fluid intake reported by staff -Will place on 1200cc fluid restriction and give dose of IV lasix  -Pt is complaining of increased sob with orthopnea today     Hypothyroidism:  Resumed synthroid . Abnormal thyroid  panel  with slightly elevated TSH and elevated free t4.  Recheck thyroid  panel in 4 weeks.      Type 2 DM with hypoglycemia:  Dced lantus  on 9/15 because of hypoglycemia. Patient has poor oral intake. Continue with SSI. May need dextrose  infusion.     Hyperlipidemia Resumed pravastatin  20 mg daily.      Bipolar disorder Resumed Zoloft      Normocytic anemia Stable     Vitamin b12 deficiency/ Iron deficiency anemia Replacements ordered.    Disposition: Lives with her husband. HHPT on discharge.         Subjective: More sob  this AM. Diarrhea improved  Physical Exam: Vitals:   12/21/23 0510 12/21/23 0733 12/21/23 1029 12/21/23 1559  BP: 120/62 (!) 159/75 (!) 180/60 (!) 118/57  Pulse: (!) 55 71 69 (!) 54  Resp: 19 18 18 18   Temp: 97.6 F (36.4 C) 98 F (36.7 C) 97.8 F (36.6 C) 98 F (36.7 C)  TempSrc: Oral Oral Oral Oral  SpO2:  98% 96% 98%  Weight:      Height:        General exam: Awake, laying in bed, in nad Respiratory system: increased respiratory effort, no wheezing Cardiovascular system: regular rate, s1, s2 Gastrointestinal system: Soft, nondistended, positive BS Central nervous system: CN2-12 grossly intact, strength intact Extremities: Perfused, no clubbing Skin: Normal skin turgor, BLE edema Psychiatry: Mood normal // no visual hallucinations   Data Reviewed:  Labs reviewed: Na 127, K 4.1, Cr 1.02, Osm 269  Family Communication: Pt in room, family at bedside  Disposition: Status is: Inpatient Remains inpatient appropriate because: severity of illness  Planned Discharge Destination: Home     Author: Garnette Pelt, MD 12/21/2023 6:00 PM  For on call review [email or web address].

## 2023-12-21 NOTE — Progress Notes (Signed)
 Mobility Specialist Progress Note:   12/21/23 0915  Mobility  Activity Ambulated with assistance  Level of Assistance Contact guard assist, steadying assist  Assistive Device Front wheel walker  Distance Ambulated (ft) 100 ft  Activity Response Tolerated well  Mobility Referral Yes  Mobility visit 1 Mobility  Mobility Specialist Start Time (ACUTE ONLY) 0915  Mobility Specialist Stop Time (ACUTE ONLY) 0926  Mobility Specialist Time Calculation (min) (ACUTE ONLY) 11 min   Pt agreeable to mobility session. Required only CGA to ambulate with RW. BP soft this am, however 136/80 (93) post-mobility. Pt c/o generalized weakness throughout, back in bed with all needs met. Alarm on.  Therisa Rana Mobility Specialist Please contact via SecureChat or  Rehab office at [phone]

## 2023-12-21 NOTE — Progress Notes (Signed)
 Physical Therapy Treatment Patient Details Name: Brittany Acosta MRN: [id number] DOB: January 22, 1951 Today's Date: 12/21/2023   History of Present Illness Patient is a 73 y/o female admitted 12/08/23 due to SOB and CP.  Found to have moderate to large pericardial effusion, underwent pericardiocentesis with drain placement 12/09/23.  PMH positive for DM, HTN, psoriatic arthropathy, fibromyalgia, h/o gout, h/o breast CA, HLD.    PT Comments  The pt was agreeable to session after breathing treatment, reports continues difficulty breathing but SpO2 97-99% on RA. The pt  was open to trial of rollator, but with ambulation found it to be too mobile, needing up to minA to steady the rollator with gait and modA to correct LOB when attempting to pivot without it. VSS on RA with all mobility, good progression of gait this afternoon given pt has already ambulated with mobility this morning as well. Will continue efforts acutely.    If plan is discharge home, recommend the following: A little help with walking and/or transfers;Assist for transportation;A little help with bathing/dressing/bathroom;Assistance with cooking/housework;Help with stairs or ramp for entrance   Can travel by private vehicle        Equipment Recommendations  Rolling walker (2 wheels)    Recommendations for Other Services       Precautions / Restrictions Precautions Precautions: Fall Recall of Precautions/Restrictions: Intact Restrictions Weight Bearing Restrictions Per Provider Order: No     Mobility  Bed Mobility Overal bed mobility: Needs Assistance Bed Mobility: Supine to Sit     Supine to sit: Supervision, HOB elevated, Used rails     General bed mobility comments: increased time, use of rails    Transfers Overall transfer level: Needs assistance Equipment used: Rollator (4 wheels) Transfers: Sit to/from Stand, Bed to chair/wheelchair/BSC Sit to Stand: Contact guard assist, Min assist   Step pivot transfers: Mod  assist       General transfer comment: CGA to minA to rise from EOB and rollator seat, cues to turn after stand to use rollator and assist to maintain balance. LOB when attempting to pivot to recliner without holding rollator needing modA    Ambulation/Gait Ambulation/Gait assistance: Min assist Gait Distance (Feet): 50 Feet (+ 50 ft) Assistive device: Rollator (4 wheels) Gait Pattern/deviations: Decreased stride length, Step-to pattern, Trunk flexed, Staggering left, Staggering right, Drifts right/left Gait velocity: slower     General Gait Details: slow, guarded gait, increased assist due to mobility of rollator needing assist to manage around obstacles.     Balance Overall balance assessment: Needs assistance Sitting-balance support: Feet supported Sitting balance-Leahy Scale: Good     Standing balance support: Single extremity supported, No upper extremity supported, During functional activity Standing balance-Leahy Scale: Fair (to poor, likely from days of inactivity.) Standing balance comment: up to modA to correct LOB, minA due to mobility of rollator                            Communication Communication Communication: No apparent difficulties  Cognition Arousal: Alert Behavior During Therapy: WFL for tasks assessed/performed, Flat affect   PT - Cognitive impairments: No apparent impairments                       PT - Cognition Comments: flat affect but agreeable with encouragement. not formally assessed Following commands: Intact      Cueing Cueing Techniques: Verbal cues  Exercises      General Comments General comments (  skin integrity, edema, etc.): VSS on RA      Pertinent Vitals/Pain Pain Assessment Pain Assessment: No/denies pain Faces Pain Scale: No hurt Pain Intervention(s): Monitored during session     PT Goals (current goals can now be found in the care plan section) Acute Rehab PT Goals Patient Stated Goal: to improve  breathing PT Goal Formulation: With patient/family Time For Goal Achievement: 12/26/23 Potential to Achieve Goals: Good Progress towards PT goals: Progressing toward goals    Frequency    Min 3X/week       AM-PAC PT 6 Clicks Mobility   Outcome Measure  Help needed turning from your back to your side while in a flat bed without using bedrails?: A Little Help needed moving from lying on your back to sitting on the side of a flat bed without using bedrails?: A Little Help needed moving to and from a bed to a chair (including a wheelchair)?: A Little Help needed standing up from a chair using your arms (e.g., wheelchair or bedside chair)?: A Little Help needed to walk in hospital room?: A Little Help needed climbing 3-5 steps with a railing? : A Little 6 Click Score: 18    End of Session Equipment Utilized During Treatment: Gait belt Activity Tolerance: Patient limited by fatigue Patient left: with call bell/phone within reach;in chair;with chair alarm set Nurse Communication: Mobility status PT Visit Diagnosis: Other abnormalities of gait and mobility (R26.89)     Time: 8676-8654 PT Time Calculation (min) (ACUTE ONLY): 22 min  Charges:    $Gait Training: 8-22 mins PT General Charges $$ ACUTE PT VISIT: 1 Visit                     Izetta Call, PT, DPT   Acute Rehabilitation Department Office [phone] Secure Chat Communication Preferred   Izetta JULIANNA Call 12/21/2023, 4:02 PM

## 2023-12-21 NOTE — Hospital Course (Signed)
 73 y.o. female with a past medical history of diabetes mellitus type 2 on insulin , essential hypertension, Psoriatec arthropathy, history of gout, history of breast cancer, hyperlipidemia who was in her usual state of health till about 2 to 3 days ago when she started developing shortness of breath along with chest pain.  CT scan chest  which raise concern for moderate to large pericardial effusion. Case was discussed with cardiology who recommended hospitalization for further evaluation.  She was taken for OR for pericardiocentesis. She went into Afib with RVR on the night of 9/9, started on amiodarone  drip on 9/10. Cardiology on board. Converted to sinus rhythm now. Now having diarrhea and vomiting and feels tired. She is also having issues with poor oral intake and hypoglycemia. Dced colchicine  on 9/15. She is from home and lives with her husband.

## 2023-12-22 DIAGNOSIS — I3139 Other pericardial effusion (noninflammatory): Secondary | ICD-10-CM | POA: Diagnosis not present

## 2023-12-22 DIAGNOSIS — R072 Precordial pain: Secondary | ICD-10-CM | POA: Diagnosis not present

## 2023-12-22 LAB — GLUCOSE, CAPILLARY
Glucose-Capillary: 170 mg/dL — ABNORMAL HIGH (ref 70–99)
Glucose-Capillary: 109 mg/dL — ABNORMAL HIGH (ref 70–99)
Glucose-Capillary: 123 mg/dL — ABNORMAL HIGH (ref 70–99)
Glucose-Capillary: 152 mg/dL — ABNORMAL HIGH (ref 70–99)

## 2023-12-22 LAB — CBC
HCT: 28.2 % — ABNORMAL LOW (ref 36.0–46.0)
Hemoglobin: 9.2 g/dL — ABNORMAL LOW (ref 12.0–15.0)
MCH: 27.3 pg (ref 26.0–34.0)
MCHC: 32.6 g/dL (ref 30.0–36.0)
MCV: 83.7 fL (ref 80.0–100.0)
Platelets: 609 K/uL — ABNORMAL HIGH (ref 150–400)
RBC: 3.37 MIL/uL — ABNORMAL LOW (ref 3.87–5.11)
RDW: 14.8 % (ref 11.5–15.5)
WBC: 19.3 K/uL — ABNORMAL HIGH (ref 4.0–10.5)
nRBC: 0 % (ref 0.0–0.2)

## 2023-12-22 LAB — COMPREHENSIVE METABOLIC PANEL WITH GFR
ALT: 16 U/L (ref 0–44)
AST: 18 U/L (ref 15–41)
Albumin: 2.4 g/dL — ABNORMAL LOW (ref 3.5–5.0)
Alkaline Phosphatase: 128 U/L — ABNORMAL HIGH (ref 38–126)
Anion gap: 15 (ref 5–15)
BUN: 11 mg/dL (ref 8–23)
CO2: 20 mmol/L — ABNORMAL LOW (ref 22–32)
Calcium: 8.7 mg/dL — ABNORMAL LOW (ref 8.9–10.3)
Chloride: 90 mmol/L — ABNORMAL LOW (ref 98–111)
Creatinine, Ser: 0.98 mg/dL (ref 0.44–1.00)
GFR, Estimated: >60 mL/min (ref >60)
Glucose, Bld: 145 mg/dL — ABNORMAL HIGH (ref 70–99)
Potassium: 4 mmol/L (ref 3.5–5.1)
Sodium: 125 mmol/L — ABNORMAL LOW (ref 135–145)
Total Bilirubin: 0.5 mg/dL (ref 0.0–1.2)
Total Protein: 5.8 g/dL — ABNORMAL LOW (ref 6.5–8.1)

## 2023-12-22 LAB — SODIUM: Sodium: 126 mmol/L — ABNORMAL LOW (ref 135–145)

## 2023-12-22 MED ORDER — FUROSEMIDE 10 MG/ML IJ SOLN
40.0000 mg | Freq: Two times a day (BID) | INTRAMUSCULAR | Status: DC
  Administered 2023-12-22: 40 mg via INTRAVENOUS
  Filled 2023-12-22: qty 4

## 2023-12-22 MED ORDER — ALBUMIN HUMAN 25 % IV SOLN
25.0000 g | Freq: Once | INTRAVENOUS | Status: AC
  Administered 2023-12-22: 25 g via INTRAVENOUS
  Filled 2023-12-22: qty 100

## 2023-12-22 NOTE — Progress Notes (Signed)
 Mobility Specialist Progress Note:   12/22/23 1211  Mobility  Activity Pivoted/transferred from chair to bed  Level of Assistance Minimal assist, patient does 75% or more  Assistive Device Other (Comment) (HHA)  Distance Ambulated (ft) 3 ft  Activity Response Tolerated fair  Mobility Referral Yes  Mobility visit 1 Mobility  Mobility Specialist Start Time (ACUTE ONLY) 1211  Mobility Specialist Stop Time (ACUTE ONLY) 1223  Mobility Specialist Time Calculation (min) (ACUTE ONLY) 12 min   Pt presenting with incr weakness this session. Required minA to stand from recliner. First attempt pt needed to sit back down immediately d/t knee instability. Second attempt pt able to stand and pivot back to bed. C/o significant fatigue today. Left with all needs met, alarm on.  Therisa Rana Mobility Specialist Please contact via SecureChat or  Rehab office at [phone]

## 2023-12-22 NOTE — Progress Notes (Signed)
 Progress Note   Patient: Brittany Acosta FMW:[id number] DOB: 10/19/1950 DOA: 12/08/2023     14 DOS: the patient was seen and examined on 12/22/2023   Brief hospital course: 73 y.o. female with a past medical history of diabetes mellitus type 2 on insulin , essential hypertension, Psoriatec arthropathy, history of gout, history of breast cancer, hyperlipidemia who was in her usual state of health till about 2 to 3 days ago when she started developing shortness of breath along with chest pain.  CT scan chest  which raise concern for moderate to large pericardial effusion. Case was discussed with cardiology who recommended hospitalization for further evaluation.  She was taken for OR for pericardiocentesis. She went into Afib with RVR on the night of 9/9, started on amiodarone  drip on 9/10. Cardiology on board. Converted to sinus rhythm now. Now having diarrhea and vomiting and feels tired. She is also having issues with poor oral intake and hypoglycemia. Dced colchicine  on 9/15. She is from home and lives with her husband.  Assessment and Plan: Afib/flutter with RVR: Developed on the night of 9/9. Started on amiodarone  drip after amio bolus on 9/10. Cardiology on board Continue tele monitoring. Converted to sinus rhythm. If patients goes back into Afib/Aflutter, she will need to be started on [facility] for stroke prophylaxis.   Pericardial effusion:  Unclear etiology, differential include uremic etiology, vs viral infection.  ESR is 55, CRP is 23 Echocardiogram showed moderate effusion, with JVD and symptomatic with sob and intermittent chest pain.  S/p pericardiocentesis with 9/5 with out. Pericardial fluid sent for analysis, it was bloody in appearance. Gram stain is negative and cultures are pending.  Cytology was sent in view of her  history of breast cancer and is negative for malignancy. She was empirically started on colchicine  0.6 mg BID along with ibuprofen  600 mg TID. Colchicine  dced on 9/15  because of profound diarrhea. Repeat echo shows resolution of the pericardial effusion, drain removed by cardiology on 9/8.    Nausea, vomiting and diarrhea: Developed on 9/13.   -diarrhea now resolved Denies abdominal pain. No fever GI panel by PCR is negative Dced colchicine  on 9/15. No indication for C.diff testing at this point. Dr. Dino discussed that with Dr Luiz, ID on 9/15.    Hypokalemia Prn repletion    Hyponatremia; secondary to SIADH/medications/Hypothyroidism/hypervolemia  TSH slightly high, free t4 elevated. Urine sodium is less than 30, and urine osmolality is 187. Serum osmo is 271. Recent Am cortisol is 8.8  Stopped zoloft  Clinically appears overloaded. CXR reviewed, suggestive of vol overload Staff reports family bringing in PO fluids despite 1200cc restriction Baseline Wt around 97kg, peaked to 103.7kg on 9/17 Cont IV lasix . Will give one dose of IV albumin  Repeat Na slightly improved to 126 Recheck lytes in AM    Hypothyroidism:  Resumed synthroid . Abnormal thyroid  panel  with slightly elevated TSH and elevated free t4.  Recheck thyroid  panel in 4 weeks.      Type 2 DM with hypoglycemia:  Dced lantus  on 9/15 because of hypoglycemia. Patient has poor oral intake. Continue with SSI. May need dextrose  infusion.     Hyperlipidemia Resumed pravastatin  20 mg daily.      Bipolar disorder Resumed Zoloft      Normocytic anemia Stable     Vitamin b12 deficiency/ Iron deficiency anemia Replacements ordered.    Disposition: Lives with her husband. HHPT on discharge.         Subjective: No further diarrhea. Admits to drinking  ample amounts of fluids this AM. Reports continued LE edema  Physical Exam: Vitals:   12/22/23 0824 12/22/23 0845 12/22/23 1145 12/22/23 1624  BP: (!) 136/51 (!) 130/51 (!) 112/53 (!) 125/45  Pulse: 76 77 (!) 58 66  Resp: 20  20 20   Temp: 98.2 F (36.8 C)  98.1 F (36.7 C) 98.3 F (36.8 C)  TempSrc: Oral  Oral Oral   SpO2: 98%  96% 100%  Weight:   102.4 kg   Height:       General exam: Conversant, in no acute distress Respiratory system: normal chest rise, clear, no audible wheezing Cardiovascular system: regular rhythm, s1-s2 Gastrointestinal system: Nondistended, nontender, pos BS Central nervous system: No seizures, no tremors Extremities: No cyanosis, BLE pitting edema Skin: No rashes, no pallor Psychiatry: Affect normal // no auditory hallucinations   Data Reviewed:  Labs reviewed: Na 125->126, K 4.0, Cr 0.98, Alk phos 128, TB 0.5, WBC 19.3, Hgb 9.2, Plts 609  Family Communication: Pt in room, family not at bedside  Disposition: Status is: Inpatient Remains inpatient appropriate because: severity of illness  Planned Discharge Destination: Home     Author: Garnette Pelt, MD 12/22/2023 5:57 PM  For on call review [email or web address].

## 2023-12-22 NOTE — Plan of Care (Signed)

## 2023-12-23 DIAGNOSIS — R072 Precordial pain: Secondary | ICD-10-CM | POA: Diagnosis not present

## 2023-12-23 DIAGNOSIS — I3139 Other pericardial effusion (noninflammatory): Secondary | ICD-10-CM | POA: Diagnosis not present

## 2023-12-23 LAB — COMPREHENSIVE METABOLIC PANEL WITH GFR
ALT: 14 U/L (ref 0–44)
AST: 15 U/L (ref 15–41)
Albumin: 2.4 g/dL — ABNORMAL LOW (ref 3.5–5.0)
Alkaline Phosphatase: 109 U/L (ref 38–126)
Anion gap: 10 (ref 5–15)
BUN: 12 mg/dL (ref 8–23)
CO2: 21 mmol/L — ABNORMAL LOW (ref 22–32)
Calcium: 8.3 mg/dL — ABNORMAL LOW (ref 8.9–10.3)
Chloride: 93 mmol/L — ABNORMAL LOW (ref 98–111)
Creatinine, Ser: 1.11 mg/dL — ABNORMAL HIGH (ref 0.44–1.00)
GFR, Estimated: 52 mL/min — ABNORMAL LOW (ref >60)
Glucose, Bld: 134 mg/dL — ABNORMAL HIGH (ref 70–99)
Potassium: 3.7 mmol/L (ref 3.5–5.1)
Sodium: 124 mmol/L — ABNORMAL LOW (ref 135–145)
Total Bilirubin: 0.7 mg/dL (ref 0.0–1.2)
Total Protein: 5.5 g/dL — ABNORMAL LOW (ref 6.5–8.1)

## 2023-12-23 LAB — BASIC METABOLIC PANEL WITH GFR
Anion gap: 14 (ref 5–15)
BUN: 16 mg/dL (ref 8–23)
CO2: 20 mmol/L — ABNORMAL LOW (ref 22–32)
Calcium: 8.6 mg/dL — ABNORMAL LOW (ref 8.9–10.3)
Chloride: 88 mmol/L — ABNORMAL LOW (ref 98–111)
Creatinine, Ser: 1.28 mg/dL — ABNORMAL HIGH (ref 0.44–1.00)
GFR, Estimated: 44 mL/min — ABNORMAL LOW (ref >60)
Glucose, Bld: 157 mg/dL — ABNORMAL HIGH (ref 70–99)
Potassium: 4.1 mmol/L (ref 3.5–5.1)
Sodium: 122 mmol/L — ABNORMAL LOW (ref 135–145)

## 2023-12-23 LAB — GLUCOSE, CAPILLARY
Glucose-Capillary: 127 mg/dL — ABNORMAL HIGH (ref 70–99)
Glucose-Capillary: 137 mg/dL — ABNORMAL HIGH (ref 70–99)
Glucose-Capillary: 145 mg/dL — ABNORMAL HIGH (ref 70–99)
Glucose-Capillary: 199 mg/dL — ABNORMAL HIGH (ref 70–99)

## 2023-12-23 LAB — CBC
HCT: 24.7 % — ABNORMAL LOW (ref 36.0–46.0)
Hemoglobin: 8.1 g/dL — ABNORMAL LOW (ref 12.0–15.0)
MCH: 27.2 pg (ref 26.0–34.0)
MCHC: 32.8 g/dL (ref 30.0–36.0)
MCV: 82.9 fL (ref 80.0–100.0)
Platelets: 602 K/uL — ABNORMAL HIGH (ref 150–400)
RBC: 2.98 MIL/uL — ABNORMAL LOW (ref 3.87–5.11)
RDW: 14.8 % (ref 11.5–15.5)
WBC: 19 K/uL — ABNORMAL HIGH (ref 4.0–10.5)
nRBC: 0 % (ref 0.0–0.2)

## 2023-12-23 LAB — MAGNESIUM: Magnesium: 1.2 mg/dL — ABNORMAL LOW (ref 1.7–2.4)

## 2023-12-23 MED ORDER — ALBUMIN HUMAN 25 % IV SOLN
25.0000 g | Freq: Once | INTRAVENOUS | Status: AC
  Administered 2023-12-23: 25 g via INTRAVENOUS
  Filled 2023-12-23: qty 100

## 2023-12-23 MED ORDER — MAGNESIUM SULFATE 4 GM/100ML IV SOLN
4.0000 g | Freq: Once | INTRAVENOUS | Status: AC
  Administered 2023-12-23: 4 g via INTRAVENOUS
  Filled 2023-12-23: qty 100

## 2023-12-23 MED ORDER — AZITHROMYCIN 250 MG PO TABS
500.0000 mg | ORAL_TABLET | Freq: Every day | ORAL | Status: AC
Start: 2023-12-23 — End: 2023-12-28
  Administered 2023-12-23 – 2023-12-27 (×4): 500 mg via ORAL
  Filled 2023-12-23 (×4): qty 2

## 2023-12-23 MED ORDER — SODIUM CHLORIDE 0.9 % IV SOLN
2.0000 g | INTRAVENOUS | Status: AC
  Administered 2023-12-23 – 2023-12-27 (×5): 2 g via INTRAVENOUS
  Filled 2023-12-23 (×5): qty 20

## 2023-12-23 MED ORDER — TOLVAPTAN 15 MG PO TABS
15.0000 mg | ORAL_TABLET | Freq: Once | ORAL | Status: AC
  Administered 2023-12-23: 15 mg via ORAL
  Filled 2023-12-23: qty 1

## 2023-12-23 NOTE — Progress Notes (Signed)
 [facility] Progress Note  Name: Brittany Acosta MRN: [id number] DOB: December 03, 1950  Chief Complaint:  Shortness of breath and chest pain  Subjective:  Brittany Acosta is a 73 y.o. female with a history of type 2 diabetes, hypertension, scleroderma, hypothyroidism, and gout who presented to the hospital with shortness of breath and chest pain on 9/4.  She was found to have a large pericardial effusion as well as pericarditis.  She underwent pericardiocentesis earlier this admission on 9/5 with 700 mL out.  Per charting, fluid was bloody bu negative for malignancy.  She has been on colchicine  and ibuprofen .  Colchicine  was later discontinued due to diarrhea.  Nephrology is reconsulted for assistance with management of hyponatremia.  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  She was also felt to possibly be dehydrated.  She received normal saline and salt tabs and sodium improved to 130.  Zoloft  was discontinued.  She then appeared to be developing overload and so she received Lasix  and albumin  and was placed on 1200 cc fluid restriction.  Sodium was 125 then 126 and now has dropped to 124.  Nephrology is reconsulted.  She had 1.4 liters UOP over 9/18.  No malignancy noted on pericardiocentesis fluid (I reviewed the 9/5 path report).  Weight is up most recently.  She has been on doxepin  at home and here.     Review of systems:  Some shortness of breath  Denies n/v Some chest pain if she is coughing       Intake/Output Summary (Last 24 hours) at 12/23/2023 1750 Last data filed at 12/23/2023 1700 Gross per 24 hour  Intake 817.29 ml  Output 1400 ml  Net -582.71 ml    Vitals:  Vitals:   12/22/23 2337 12/23/23 0316 12/23/23 1051 12/23/23 1634  BP: 134/79 (!) 111/49 (!) 116/50 (!) 108/49  Pulse: 68 68 64 (!) 59  Resp: 16 16  18   Temp: 98.9 F (37.2 C) 98.6 F (37 C)  97.7 F (36.5 C)  TempSrc:  Oral Oral  Oral  SpO2: 96% 99%  99%  Weight:  100.7 kg    Height:         Physical Exam:  General elderly female in chair in no acute distress HEENT normocephalic atraumatic extraocular movements intact sclera anicteric Neck supple trachea midline Lungs clear to auscultation bilaterally normal work of breathing at rest  Heart S1S2 no rub appreciated Abdomen soft nontender nondistended Extremities 1-2+ edema  Psych normal mood and affect Neuro awake on arrival; alert and oriented x 3 provides hx and follows commands   Medications reviewed   Labs:     Latest Ref Rng & Units 12/23/2023    4:54 AM 12/22/2023   12:12 PM 12/22/2023    4:55 AM  BMP  Glucose 70 - 99 mg/dL 865   854   BUN 8 - 23 mg/dL 12   11   Creatinine 9.55 - 1.00 mg/dL 8.88   9.01   Sodium 864 - 145 mmol/L 124  126  125   Potassium 3.5 - 5.1 mmol/L 3.7   4.0   Chloride 98 - 111 mmol/L 93   90   CO2 22 - 32 mmol/L 21   20   Calcium  8.9 - 10.3 mg/dL 8.3   8.7      Assessment/Plan:    # Hyponatremia   - Previously felt 2/2 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 More recently concern re: overload.  She has also been on doxepin  here (and at home) which can cause SIADH.  No malignancy noted on pericardiocentesis fluid - Would discontinue doxepin   - tolvaptan  15 mg once now  - check sodium every 8 hours x 3 - Can stop salt tabs - may help with the edema - Unable to discontinue NSAID's given the pericarditis per team - Agree with daily weights - strict ins/outs   # Pericardial effusion - Etiology is unclear at this time - Status post pericardiocentesis on 9/5 - On NSAIDs  # Hypothyroidism - TSH slightly elevated - On Synthroid  per primary team  Please do not hesitate to contact me with any questions regarding our patient  Katheryn JAYSON Saba, MD 12/23/2023 6:18 PM

## 2023-12-23 NOTE — Plan of Care (Signed)
  Problem: Education: Goal: Knowledge of General Education information will improve Description: Including pain rating scale, medication(s)/side effects and non-pharmacologic comfort measures Outcome: Progressing   Problem: Clinical Measurements: Goal: Will remain free from infection Outcome: Progressing   Problem: Clinical Measurements: Goal: Diagnostic test results will improve Outcome: Progressing   Problem: Clinical Measurements: Goal: Respiratory complications will improve Outcome: Progressing   Problem: Clinical Measurements: Goal: Cardiovascular complication will be avoided Outcome: Progressing   Problem: Coping: Goal: Level of anxiety will decrease Outcome: Progressing   Problem: Elimination: Goal: Will not experience complications related to bowel motility Outcome: Progressing   Problem: Skin Integrity: Goal: Risk for impaired skin integrity will decrease Outcome: Progressing   Problem: Health Behavior/Discharge Planning: Goal: Ability to manage health-related needs will improve Outcome: Progressing   Problem: Metabolic: Goal: Ability to maintain appropriate glucose levels will improve Outcome: Progressing   Problem: Skin Integrity: Goal: Risk for impaired skin integrity will decrease Outcome: Progressing   Problem: Activity: Goal: Ability to return to baseline activity level will improve Outcome: Progressing

## 2023-12-23 NOTE — Care Management Important Message (Signed)
 Important Message  Patient Details  Name: Brittany Acosta MRN: [id number] Date of Birth: [birth year]   Important Message Given:  Yes - Medicare IM     [name] 12/23/2023, 9:48 AM

## 2023-12-23 NOTE — Progress Notes (Signed)
 Progress Note   Patient: Brittany Acosta FMW:[id number] DOB: [birth year] DOA: 12/08/2023     15 DOS: the patient was seen and examined on 12/23/2023   Brief hospital course: 73 y.o. female with a past medical history of diabetes mellitus type 2 on insulin , essential hypertension, Psoriatec arthropathy, history of gout, history of breast cancer, hyperlipidemia who was in her usual state of health till about 2 to 3 days ago when she started developing shortness of breath along with chest pain.  CT scan chest  which raise concern for moderate to large pericardial effusion. Case was discussed with cardiology who recommended hospitalization for further evaluation.  She was taken for OR for pericardiocentesis. She went into Afib with RVR on the night of 9/9, started on amiodarone  drip on 9/10. Cardiology on board. Converted to sinus rhythm now. Now having diarrhea and vomiting and feels tired. She is also having issues with poor oral intake and hypoglycemia. Dced colchicine  on 9/15. She is from home and lives with her husband.  Assessment and Plan: Afib/flutter with RVR: Developed on the night of 9/9. Started on amiodarone  drip after amio bolus on 9/10. Cardiology had been following Continue tele monitoring. Converted to sinus rhythm. If patients goes back into Afib/Aflutter, she will need to be started on [facility] for stroke prophylaxis.   Pericardial effusion:  Unclear etiology, differential include uremic etiology, vs viral infection.  ESR is 55, CRP is 23 Echocardiogram showed moderate effusion, with JVD and symptomatic with sob and intermittent chest pain.  S/p pericardiocentesis with 9/5 with out. Pericardial fluid sent for analysis, it was bloody in appearance. Gram stain is negative and cultures are pending.  Cytology was sent in view of her  history of breast cancer and is negative for malignancy. She was empirically started on colchicine  0.6 mg BID along with ibuprofen  600 mg TID. Colchicine   dced on 9/15 because of profound diarrhea. Repeat echo shows resolution of the pericardial effusion, drain removed by cardiology on 9/8.    Nausea, vomiting and diarrhea: Developed on 9/13.   -diarrhea now resolved Denies abdominal pain. No fever GI panel by PCR is negative Dced colchicine  on 9/15. No indication for C.diff testing at this point. Dr. Dino discussed that with Dr Luiz, ID on 9/15.    Hypokalemia Prn repletion    Hyponatremia; secondary to SIADH/medications/Hypothyroidism/hypervolemia  TSH slightly high, free t4 elevated.  Recent Am cortisol is 8.8  Stopped zoloft  Clinically appears overloaded. Recent CXR reviewed, suggestive of vol overload Staff reports family bringing in PO fluids despite 1200cc restriction Baseline Wt around 97kg, peaked to 103.7kg on 9/17 Given doses of IV lasix  with IV albumin  Repeat Na slightly improved to 126 but worsened to 124 today Held lasix  for now and re-engaged Nephrology. Recs to hold doxepin  and give dose of tolvaptan . Stop salt tabs    Hypothyroidism:  Resumed synthroid . Abnormal thyroid  panel  with slightly elevated TSH and elevated free t4.  Recheck thyroid  panel in 4 weeks.      Type 2 DM with hypoglycemia:  Dced lantus  on 9/15 because of hypoglycemia. Patient has poor oral intake. Continue with SSI. May need dextrose  infusion.     Hyperlipidemia Resumed pravastatin  20 mg daily.      Bipolar disorder Resumed Zoloft      Normocytic anemia Stable     Vitamin b12 deficiency/ Iron deficiency anemia Replacements ordered.    Disposition: Lives with her husband. HHPT on discharge.         Subjective:  Feeling weaker today  Physical Exam: Vitals:   12/22/23 2337 12/23/23 0316 12/23/23 1051 12/23/23 1634  BP: 134/79 (!) 111/49 (!) 116/50 (!) 108/49  Pulse: 68 68 64 (!) 59  Resp: 16 16  18   Temp: 98.9 F (37.2 C) 98.6 F (37 C)  97.7 F (36.5 C)  TempSrc: Oral Oral  Oral  SpO2: 96% 99%  99%  Weight:   100.7 kg    Height:       General exam: Awake, laying in bed, in nad Respiratory system: Normal respiratory effort, no wheezing Cardiovascular system: regular rate, s1, s2 Gastrointestinal system: Soft, nondistended, positive BS Central nervous system: CN2-12 grossly intact, strength intact Extremities: Perfused, no clubbing Skin: Normal skin turgor, no notable skin lesions seen Psychiatry: Mood normal // no visual hallucinations   Data Reviewed:  Labs reviewed: Na 124, K 3.7, Cr 1.11, WBC 19, Hgb 8.1, Plts 602   Family Communication: Pt in room, family not at bedside  Disposition: Status is: Inpatient Remains inpatient appropriate because: severity of illness  Planned Discharge Destination: Home     Author: Garnette Pelt, MD 12/23/2023 6:18 PM  For on call review [email or web address].

## 2023-12-23 NOTE — Plan of Care (Signed)
  Problem: Education: Goal: Knowledge of General Education information will improve Description: Including pain rating scale, medication(s)/side effects and non-pharmacologic comfort measures Outcome: Progressing   Problem: Clinical Measurements: Goal: Respiratory complications will improve Outcome: Progressing Goal: Cardiovascular complication will be avoided Outcome: Progressing   Problem: Activity: Goal: Risk for activity intolerance will decrease Outcome: Progressing   Problem: Nutrition: Goal: Adequate nutrition will be maintained Outcome: Progressing   Problem: Elimination: Goal: Will not experience complications related to bowel motility Outcome: Progressing Goal: Will not experience complications related to urinary retention Outcome: Progressing   Problem: Pain Managment: Goal: General experience of comfort will improve and/or be controlled Outcome: Progressing   Problem: Skin Integrity: Goal: Risk for impaired skin integrity will decrease Outcome: Progressing   Problem: Coping: Goal: Level of anxiety will decrease Outcome: Not Progressing

## 2023-12-23 NOTE — Progress Notes (Signed)
 PT Cancellation Note  Patient Details Name: Brittany Acosta MRN: [id number] DOB: 1950/06/06   Cancelled Treatment:    Reason Eval/Treat Not Completed: (P) Other (comment) (pt sleeping very deeply, did not awaken to loud voice.) MD notified pt would benefit from OT consult given increased debility past couple PT/mobility sessions per chart review. Will continue efforts per PT plan of care as schedule permits.   [name] 12/23/2023, 2:28 PM

## 2023-12-23 NOTE — Progress Notes (Signed)
 Mobility Specialist Progress Note:    12/23/23 1506  Mobility  Activity Dangled on edge of bed (x2 STS. Leg Ext, Leg Curls, Ankle Pumps)  Level of Assistance Minimal assist, patient does 75% or more  Assistive Device Other (Comment) (HHA)  Range of Motion/Exercises Left leg;Right leg  Activity Response Tolerated poorly  Mobility Referral Yes  Mobility visit 1 Mobility  Mobility Specialist Start Time (ACUTE ONLY) 1506  Mobility Specialist Stop Time (ACUTE ONLY) 1511  Mobility Specialist Time Calculation (min) (ACUTE ONLY) 5 min   Pt pleasant but felt weak and wasn't sure if she would be able to do anything. Pt able to do some exercises and 2 stands before feeling to fatigued. Returned pt to recliner w/ all needs met.   Venetia Keel Mobility Specialist Please Neurosurgeon or Rehab Office at [phone]

## 2023-12-24 ENCOUNTER — Inpatient Hospital Stay (HOSPITAL_COMMUNITY)

## 2023-12-24 DIAGNOSIS — R072 Precordial pain: Secondary | ICD-10-CM | POA: Diagnosis not present

## 2023-12-24 DIAGNOSIS — I3139 Other pericardial effusion (noninflammatory): Secondary | ICD-10-CM | POA: Diagnosis not present

## 2023-12-24 LAB — COMPREHENSIVE METABOLIC PANEL WITH GFR
ALT: 13 U/L (ref 0–44)
AST: 17 U/L (ref 15–41)
Albumin: 2.7 g/dL — ABNORMAL LOW (ref 3.5–5.0)
Alkaline Phosphatase: 121 U/L (ref 38–126)
Anion gap: 14 (ref 5–15)
BUN: 16 mg/dL (ref 8–23)
CO2: 19 mmol/L — ABNORMAL LOW (ref 22–32)
Calcium: 8.6 mg/dL — ABNORMAL LOW (ref 8.9–10.3)
Chloride: 91 mmol/L — ABNORMAL LOW (ref 98–111)
Creatinine, Ser: 1.27 mg/dL — ABNORMAL HIGH (ref 0.44–1.00)
GFR, Estimated: 45 mL/min — ABNORMAL LOW (ref >60)
Glucose, Bld: 138 mg/dL — ABNORMAL HIGH (ref 70–99)
Potassium: 3.6 mmol/L (ref 3.5–5.1)
Sodium: 124 mmol/L — ABNORMAL LOW (ref 135–145)
Total Bilirubin: 0.3 mg/dL (ref 0.0–1.2)
Total Protein: 6.2 g/dL — ABNORMAL LOW (ref 6.5–8.1)

## 2023-12-24 LAB — GLUCOSE, CAPILLARY
Glucose-Capillary: 185 mg/dL — ABNORMAL HIGH (ref 70–99)
Glucose-Capillary: 200 mg/dL — ABNORMAL HIGH (ref 70–99)
Glucose-Capillary: 126 mg/dL — ABNORMAL HIGH (ref 70–99)
Glucose-Capillary: 133 mg/dL — ABNORMAL HIGH (ref 70–99)

## 2023-12-24 LAB — HIV ANTIBODY (ROUTINE TESTING W REFLEX): HIV Screen 4th Generation wRfx: NONREACTIVE

## 2023-12-24 LAB — CBC
HCT: 26.5 % — ABNORMAL LOW (ref 36.0–46.0)
Hemoglobin: 8.6 g/dL — ABNORMAL LOW (ref 12.0–15.0)
MCH: 27.4 pg (ref 26.0–34.0)
MCHC: 32.5 g/dL (ref 30.0–36.0)
MCV: 84.4 fL (ref 80.0–100.0)
Platelets: 629 K/uL — ABNORMAL HIGH (ref 150–400)
RBC: 3.14 MIL/uL — ABNORMAL LOW (ref 3.87–5.11)
RDW: 15.5 % (ref 11.5–15.5)
WBC: 18 K/uL — ABNORMAL HIGH (ref 4.0–10.5)
nRBC: 0.2 % (ref 0.0–0.2)

## 2023-12-24 LAB — SODIUM
Sodium: 124 mmol/L — ABNORMAL LOW (ref 135–145)
Sodium: 126 mmol/L — ABNORMAL LOW (ref 135–145)

## 2023-12-24 MED ORDER — IBUPROFEN 200 MG PO TABS
400.0000 mg | ORAL_TABLET | Freq: Three times a day (TID) | ORAL | Status: DC
  Administered 2023-12-24 – 2023-12-29 (×14): 400 mg via ORAL
  Filled 2023-12-24 (×14): qty 2

## 2023-12-24 MED ORDER — FUROSEMIDE 10 MG/ML IJ SOLN
40.0000 mg | Freq: Once | INTRAMUSCULAR | Status: AC
  Administered 2023-12-24: 40 mg via INTRAVENOUS
  Filled 2023-12-24: qty 4

## 2023-12-24 MED ORDER — HYDROCOD POLI-CHLORPHE POLI ER 10-8 MG/5ML PO SUER
5.0000 mL | Freq: Two times a day (BID) | ORAL | Status: DC
  Administered 2023-12-24 – 2024-01-02 (×18): 5 mL via ORAL
  Filled 2023-12-24 (×18): qty 5

## 2023-12-24 NOTE — Progress Notes (Signed)
 Progress Note   Patient: Brittany Acosta FMW:[id number] DOB: February 25, 1951 DOA: 12/08/2023     16 DOS: the patient was seen and examined on 12/24/2023   Brief hospital course: 73 y.o. female with a past medical history of diabetes mellitus type 2 on insulin , essential hypertension, Psoriatec arthropathy, history of gout, history of breast cancer, hyperlipidemia who was in her usual state of health till about 2 to 3 days ago when she started developing shortness of breath along with chest pain.  CT scan chest  which raise concern for moderate to large pericardial effusion. Case was discussed with cardiology who recommended hospitalization for further evaluation.  She was taken for OR for pericardiocentesis. She went into Afib with RVR on the night of 9/9, started on amiodarone  drip on 9/10. Cardiology on board. Converted to sinus rhythm now. Now having diarrhea and vomiting and feels tired. She is also having issues with poor oral intake and hypoglycemia. Dced colchicine  on 9/15. She is from home and lives with her husband.  Assessment and Plan: Afib/flutter with RVR: Developed on the night of 9/9. Started on amiodarone  drip after amio bolus on 9/10. Cardiology had been following Continue tele monitoring. Converted to sinus rhythm. If patients goes back into Afib/Aflutter, she will need to be started on [facility] for stroke prophylaxis.   Pericardial effusion:  Unclear etiology, differential include uremic etiology, vs viral infection.  ESR is 55, CRP is 23 Echocardiogram showed moderate effusion, with JVD and symptomatic with sob and intermittent chest pain.  S/p pericardiocentesis with 9/5 with out. Pericardial fluid sent for analysis, it was bloody in appearance. Gram stain is negative and cultures are pending.  Cytology was sent in view of her  history of breast cancer and is negative for malignancy. She was empirically started on colchicine  0.6 mg BID along with ibuprofen  600 mg TID. Colchicine   dced on 9/15 because of profound diarrhea. Repeat echo shows resolution of the pericardial effusion, drain removed by cardiology on 9/8.  Reduced ibuprofen  dose to 400mg  TID given rising Cr   Nausea, vomiting and diarrhea: Developed on 9/13.   -diarrhea now resolved Denies abdominal pain. No fever GI panel by PCR is negative Dced colchicine  on 9/15.    Hypokalemia Prn repletion    Hyponatremia; secondary to SIADH/medications/Hypothyroidism/hypervolemia  TSH slightly high, free t4 elevated.  Recent Am cortisol is 8.8  Stopped zoloft  Clinically appears overloaded. Recent CXR reviewed, suggestive of vol overload Staff reports family bringing in PO fluids despite 1200cc restriction Baseline Wt around 97kg, peaked to 103.7kg on 9/17 Given doses of IV lasix  with IV albumin  Repeat Na slightly improved to 126 but worsened to 124 today Held lasix  for now and re-engaged Nephrology. Recs to hold doxepin  and give dose of tolvaptan . Stop salt tabs    Hypothyroidism:  Resumed synthroid . Abnormal thyroid  panel  with slightly elevated TSH and elevated free t4.  Recheck thyroid  panel in 4 weeks.      Type 2 DM with hypoglycemia:  Dced lantus  on 9/15 because of hypoglycemia. Patient has poor oral intake. Continue with SSI.     Hyperlipidemia Resumed pravastatin  20 mg daily.      Bipolar disorder Resumed Zoloft      Normocytic anemia Stable     Vitamin b12 deficiency/ Iron deficiency anemia Replacements ordered.    Ileus Abd more distended today and pt appears visibly uncomfortable Reports BM overnight and reports passing flatus Abd xray confirms ileus. Stopped oxybutynin   Downgraded to clears for now, resume  diet as tolerated Encourage ambulation with therapy  Possible pneumonia Increased nonproductive cough recently CXR with opacity in L retrocardiac lung base Afebrile. WBC up to 19.3k Empirically started rocephin  with azithro Will change robitussin to tussionex       Subjective: Feeling more bloated today. Is passing flatus. Mainly complaining of on-going cough  Physical Exam: Vitals:   12/23/23 2345 12/24/23 0517 12/24/23 0834 12/24/23 1122  BP: (!) 117/57 112/66 (!) 132/43 (!) 131/57  Pulse: 68 61 63 64  Resp: (!) 22 (!) 22 (!) 24 18  Temp: 98 F (36.7 C) 98.3 F (36.8 C) 97.7 F (36.5 C) (!) 97.5 F (36.4 C)  TempSrc: Oral Oral Oral Oral  SpO2: 100% 98% 95% 95%  Weight:  103.7 kg    Height:       General exam: Awake, laying in bed, appears uncomfortable Respiratory system: Increased resp effort, actively coughing Cardiovascular system: regular rate, s1, s2 Gastrointestinal system: distended, decreased BS, tympanic Central nervous system: CN2-12 grossly intact, strength intact Extremities: Perfused, no clubbing Skin: Normal skin turgor, no notable skin lesions seen Psychiatry: Mood normal // affect seems normal  Data Reviewed:  Labs reviewed: Na 124, K 3.6, Cr 1.27, WBC 18.0, Hgb 8.6, Plts 629   Family Communication: Pt in room, family at bedside  Disposition: Status is: Inpatient Remains inpatient appropriate because: severity of illness  Planned Discharge Destination: Home     Author: Garnette Pelt, MD 12/24/2023 3:59 PM  For on call review [email or web address].

## 2023-12-24 NOTE — Progress Notes (Signed)
 [facility] Progress Note  Name: Brittany Acosta MRN: [id number] DOB: 1950-11-07  Chief Complaint:  Shortness of breath and chest pain  Subjective:  She had what appears to be 1.8 liters UOP over 9/19.  (1.4 liters and an entry of 403x under occurrences).  Her labs overnight were delayed. Spoke with her daughter at bedside about fluid status and meds.     Review of systems:    Some shortness of breath  Reports nausea but states this is really just after a coughing fit Some chest pain if she is coughing   ---------------------- Background on re-consult:  ANNAI Acosta is a 73 y.o. female with a history of type 2 diabetes, hypertension, scleroderma, hypothyroidism, and gout who presented to the hospital with shortness of breath and chest pain on 9/4.  She was found to have a large pericardial effusion as well as pericarditis.  She underwent pericardiocentesis earlier this admission on 9/5 with 700 mL out.  Per charting, fluid was bloody bu negative for malignancy.  She has been on colchicine  and ibuprofen .  Colchicine  was later discontinued due to diarrhea.  Nephrology is reconsulted for assistance with management of hyponatremia.  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  She was also felt to possibly be dehydrated.  She received normal saline and salt tabs and sodium improved to 130.  Zoloft  was discontinued.  She then appeared to be developing overload and so she received Lasix  and albumin  and was placed on 1200 cc fluid restriction.  Sodium was 125 then 126 and now has dropped to 124.  Nephrology is reconsulted.  She had 1.4 liters UOP over 9/18.  No malignancy noted on pericardiocentesis fluid (I reviewed the 9/5 path report).  Weight is up most recently.  She has been on doxepin  at home and here.      Intake/Output Summary (Last 24 hours) at 12/24/2023 1224 Last data filed at 12/24/2023  0800 Gross per 24 hour  Intake 1620 ml  Output 1000 ml  Net 620 ml    Vitals:  Vitals:   12/23/23 2345 12/24/23 0517 12/24/23 0834 12/24/23 1122  BP: (!) 117/57 112/66 (!) 132/43 (!) 131/57  Pulse: 68 61 63 64  Resp: (!) 22 (!) 22 (!) 24 18  Temp: 98 F (36.7 C) 98.3 F (36.8 C) 97.7 F (36.5 C) (!) 97.5 F (36.4 C)  TempSrc: Oral Oral Oral Oral  SpO2: 100% 98% 95% 95%  Weight:  103.7 kg    Height:         Physical Exam:    General elderly female in chair in no acute distress HEENT normocephalic atraumatic extraocular movements intact sclera anicteric Neck supple trachea midline Lungs clear to auscultation bilaterally normal work of breathing at rest  Heart S1S2 no rub appreciated Abdomen soft nontender nondistended Extremities 2+ edema  Psych normal mood and affect Neuro awake on arrival; alert and oriented x 3 provides hx and follows commands   Medications reviewed   Labs:     Latest Ref Rng & Units 12/24/2023    6:01 AM 12/24/2023    6:00 AM 12/23/2023    7:04 PM  BMP  Glucose 70 - 99 mg/dL 861   842   BUN 8 - 23 mg/dL 16   16   Creatinine 9.55 - 1.00 mg/dL 8.72   8.71   Sodium 864 - 145 mmol/L 124  124  122  Potassium 3.5 - 5.1 mmol/L 3.6   4.1   Chloride 98 - 111 mmol/L 91   88   CO2 22 - 32 mmol/L 19   20   Calcium  8.9 - 10.3 mg/dL 8.6   8.6      Assessment/Plan:    # Hyponatremia   - Previously felt 2/2 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 More recently concern re: overload.  She has also been on doxepin  here (and at home) which can cause SIADH.  No malignancy noted on pericardiocentesis fluid.  Weights are up from prior.  I have discontinued doxepin  (last dose on 9/19).  ------------------------------- - s/p tolvaptan  15 mg once on 9/19 - Sodium today at 1400  - Gave lasix  once this AM, as well   - We have stopped the salt tabs (which may help with the edema) - Unable to discontinue NSAID's given the pericarditis per team -  Agree with daily weights - strict ins/outs  - Will resume fluid restriction later today (24 hours after tolvaptan  would be 19:46 today)  # Pericardial effusion - Etiology is unclear at this time - Status post pericardiocentesis on 9/5 - On NSAIDs  # Hypothyroidism - TSH slightly elevated - On Synthroid  per primary team  # Elevated creatinine - Her creatinine is rising likely due to the scheduled NSAID's - would decrease or adjust regimen as able as I am concerned that she will have worsening AKI - We are diuresing as above to optimize volume status   # HTN  - Acceptable    Disposition - continue inpatient monitoring   Katheryn JAYSON Saba, MD 12/24/2023 12:53 PM

## 2023-12-24 NOTE — Progress Notes (Signed)
 Mobility Specialist Progress Note:    12/24/23 1335  Mobility  Activity  (bed level exercises)  Level of Assistance Minimal assist, patient does 75% or more  Activity Response Tolerated well  Mobility Referral Yes  Mobility visit 1 Mobility  Mobility Specialist Start Time (ACUTE ONLY) 1048  Mobility Specialist Stop Time (ACUTE ONLY) 1105  Mobility Specialist Time Calculation (min) (ACUTE ONLY) 17 min   Pt received in bed declining OOB mobility stating she just got back to bed from the toilet and even that was a challenge. After education on the importance of mobility pt agreeable to perform bed level exercises. Left in bed w/ call bell and personal belongings in reach. All needs met.   Thersia Minder Mobility Specialist  Please contact vis Secure Chat or  Rehab Office [phone]

## 2023-12-24 NOTE — Evaluation (Signed)
 Occupational Therapy Evaluation Patient Details Name: Brittany Acosta MRN: [id number] DOB: 1950-10-04 Today's Date: 12/24/2023   History of Present Illness   Patient is a 73 y/o female admitted 12/08/23 due to SOB and CP.  Found to have moderate to large pericardial effusion, underwent pericardiocentesis with drain placement 12/09/23.  PMH positive for DM, HTN, psoriatic arthropathy, fibromyalgia, h/o gout, h/o breast CA, HLD.     Clinical Impressions Pt admitted based on above, and was seen based on problem list below. PTA pt was independent with ADLs and IADLs. Today pt is requiring set up  to total for ADLs. Functional transfers are  mod assist with RW. Pt with poor standing balance and activity tolerance. Discussed with pt and family, based on today's performance, recommending  <3 hours of skilled rehab daily. OT will continue to follow acutely to maximize functional independence.     If plan is discharge home, recommend the following:   A lot of help with walking and/or transfers;A lot of help with bathing/dressing/bathroom;Help with stairs or ramp for entrance     Functional Status Assessment   Patient has had a recent decline in their functional status and demonstrates the ability to make significant improvements in function in a reasonable and predictable amount of time.     Equipment Recommendations   BSC/3in1      Precautions/Restrictions   Precautions Precautions: Fall Recall of Precautions/Restrictions: Intact Restrictions Weight Bearing Restrictions Per Provider Order: No     Mobility Bed Mobility Overal bed mobility: Needs Assistance        Transfers Overall transfer level: Needs assistance Equipment used: Rolling walker (2 wheels) Transfers: Sit to/from Stand, Bed to chair/wheelchair/BSC Sit to Stand: Mod assist     Step pivot transfers: Mod assist     General transfer comment: Assist to stand from elevated height, mod assist for cueing pt tending  to sit prematurely      Balance Overall balance assessment: Needs assistance Sitting-balance support: Feet supported Sitting balance-Leahy Scale: Fair     Standing balance support: Bilateral upper extremity supported, During functional activity, Reliant on assistive device for balance Standing balance-Leahy Scale: Poor     ADL either performed or assessed with clinical judgement   ADL Overall ADL's : Needs assistance/impaired Eating/Feeding: Set up;Sitting   Grooming: Set up;Sitting   Upper Body Bathing: Set up;Sitting   Lower Body Bathing: Maximal assistance;Sit to/from stand   Upper Body Dressing : Set up;Sitting   Lower Body Dressing: Maximal assistance;Sit to/from stand   Toilet Transfer: Moderate assistance;Stand-pivot;Rolling walker (2 wheels);BSC/3in1 Toilet Transfer Details (indicate cue type and reason): Mod assist to stand from elevated bed height Toileting- Clothing Manipulation and Hygiene: Total assistance;Sit to/from stand Toileting - Clothing Manipulation Details (indicate cue type and reason): Pt unable to perform perihygiene in standing     Functional mobility during ADLs: Moderate assistance;Rolling walker (2 wheels) General ADL Comments: Pt with decreased activity tolerance, poor standing balance     Vision Baseline Vision/History: 1 Wears glasses Vision Assessment?: No apparent visual deficits            Pertinent Vitals/Pain Pain Assessment Pain Assessment: No/denies pain Pain Intervention(s): Monitored during session     Extremity/Trunk Assessment Upper Extremity Assessment Upper Extremity Assessment: RUE deficits/detail RUE Deficits / Details: reports R shoulder pain with function/elevation, not formally tested   Lower Extremity Assessment Lower Extremity Assessment: Defer to PT evaluation   Cervical / Trunk Assessment Cervical / Trunk Assessment: Normal   Communication Communication Communication: No  apparent difficulties    Cognition Arousal: Alert Behavior During Therapy: WFL for tasks assessed/performed, Flat affect     Following commands: Intact       Cueing  General Comments   Cueing Techniques: Verbal cues  Husband and son present for session and supportive           Home Living Family/patient expects to be discharged to:: Private residence Living Arrangements: Spouse/significant other Available Help at Discharge: Family Type of Home: House Home Access: Stairs to enter Secretary/administrator of Steps: 2 Entrance Stairs-Rails: None Home Layout: One level     Bathroom Shower/Tub: Chief Strategy Officer: Standard Bathroom Accessibility: Yes   Home Equipment: Grab bars - tub/shower;Rollator (4 wheels)          Prior Functioning/Environment Prior Level of Function : Independent/Modified Independent         Mobility Comments: PTA No AD, denies falls. RN reporting x2 near falls off BSC this week ADLs Comments: Wears briefs at baseline    OT Problem List: Decreased strength;Decreased range of motion;Decreased activity tolerance;Impaired balance (sitting and/or standing);Decreased safety awareness;Cardiopulmonary status limiting activity   OT Treatment/Interventions: Self-care/ADL training;Therapeutic exercise;Energy conservation;Therapeutic activities;Patient/family education;Balance training;DME and/or AE instruction      OT Goals(Current goals can be found in the care plan section)   Acute Rehab OT Goals Patient Stated Goal: To rest OT Goal Formulation: With patient Time For Goal Achievement: 01/07/24 Potential to Achieve Goals: Good   OT Frequency:  Min 2X/week    Co-evaluation              AM-PAC OT 6 Clicks Daily Activity     Outcome Measure Help from another person eating meals?: None Help from another person taking care of personal grooming?: A Little Help from another person toileting, which includes using toliet, bedpan, or urinal?:  Total Help from another person bathing (including washing, rinsing, drying)?: A Lot Help from another person to put on and taking off regular upper body clothing?: A Little Help from another person to put on and taking off regular lower body clothing?: A Lot 6 Click Score: 15   End of Session Equipment Utilized During Treatment: Gait belt;Rolling walker (2 wheels) Nurse Communication: Mobility status  Activity Tolerance: Patient tolerated treatment well Patient left: in bed;with call bell/phone within reach;with bed alarm set  OT Visit Diagnosis: Unsteadiness on feet (R26.81);Other abnormalities of gait and mobility (R26.89);Muscle weakness (generalized) (M62.81)                Time: 1524-1610 OT Time Calculation (min): 46 min Charges:  OT General Charges $OT Visit: 1 Visit OT Evaluation $OT Eval Moderate Complexity: 1 Mod OT Treatments $Self Care/Home Management : 23-37 mins  Adrianne BROCKS, OT  Acute Rehabilitation Services Office [phone] Secure chat preferred   Adrianne GORMAN Savers 12/24/2023, 4:20 PM

## 2023-12-24 NOTE — Plan of Care (Signed)
  Problem: Clinical Measurements: Goal: Cardiovascular complication will be avoided Outcome: Progressing   Problem: Activity: Goal: Risk for activity intolerance will decrease Outcome: Progressing   Problem: Nutrition: Goal: Adequate nutrition will be maintained Outcome: Progressing   Problem: Pain Managment: Goal: General experience of comfort will improve and/or be controlled Outcome: Progressing   Problem: Safety: Goal: Ability to remain free from injury will improve Outcome: Progressing

## 2023-12-25 ENCOUNTER — Inpatient Hospital Stay (HOSPITAL_COMMUNITY)

## 2023-12-25 DIAGNOSIS — R072 Precordial pain: Secondary | ICD-10-CM | POA: Diagnosis not present

## 2023-12-25 DIAGNOSIS — I3139 Other pericardial effusion (noninflammatory): Secondary | ICD-10-CM | POA: Diagnosis not present

## 2023-12-25 LAB — RENAL FUNCTION PANEL
Albumin: 2.6 g/dL — ABNORMAL LOW (ref 3.5–5.0)
Anion gap: 15 (ref 5–15)
BUN: 17 mg/dL (ref 8–23)
CO2: 21 mmol/L — ABNORMAL LOW (ref 22–32)
Calcium: 8.7 mg/dL — ABNORMAL LOW (ref 8.9–10.3)
Chloride: 92 mmol/L — ABNORMAL LOW (ref 98–111)
Creatinine, Ser: 1.02 mg/dL — ABNORMAL HIGH (ref 0.44–1.00)
GFR, Estimated: 58 mL/min — ABNORMAL LOW (ref >60)
Glucose, Bld: 123 mg/dL — ABNORMAL HIGH (ref 70–99)
Phosphorus: 4 mg/dL (ref 2.5–4.6)
Potassium: 3.6 mmol/L (ref 3.5–5.1)
Sodium: 128 mmol/L — ABNORMAL LOW (ref 135–145)

## 2023-12-25 LAB — GLUCOSE, CAPILLARY
Glucose-Capillary: 140 mg/dL — ABNORMAL HIGH (ref 70–99)
Glucose-Capillary: 146 mg/dL — ABNORMAL HIGH (ref 70–99)
Glucose-Capillary: 108 mg/dL — ABNORMAL HIGH (ref 70–99)
Glucose-Capillary: 141 mg/dL — ABNORMAL HIGH (ref 70–99)

## 2023-12-25 MED ORDER — BENZONATATE 100 MG PO CAPS
200.0000 mg | ORAL_CAPSULE | Freq: Three times a day (TID) | ORAL | Status: DC
  Administered 2023-12-25 – 2024-01-02 (×23): 200 mg via ORAL
  Filled 2023-12-25 (×23): qty 2

## 2023-12-25 MED ORDER — FUROSEMIDE 10 MG/ML IJ SOLN
40.0000 mg | Freq: Once | INTRAMUSCULAR | Status: AC
  Administered 2023-12-25: 40 mg via INTRAVENOUS
  Filled 2023-12-25: qty 4

## 2023-12-25 MED ORDER — GUAIFENESIN 100 MG/5ML PO LIQD
5.0000 mL | ORAL | Status: DC | PRN
  Administered 2023-12-25 – 2023-12-27 (×5): 5 mL via ORAL
  Filled 2023-12-25 (×6): qty 10

## 2023-12-25 NOTE — Progress Notes (Signed)
   12/25/23 0300  What Happened  Was fall witnessed? Yes  Who witnessed fall? Courtney, NT  Patients activity before fall bathroom-assisted (bedside commode)  Point of contact buttocks  Was patient injured? No  Patient found on floor  Found by Staff-comment Carylon assisting patient to [facility] with walker)  Provider Notification  Provider Name/Title Dr. Charlton  Date Provider Notified 12/25/23  Time Provider Notified [phone]  Method of Notification Page  Notification Reason Fall  Provider response No new orders  Date of Provider Response 12/25/23  Time of Provider Response 0311  Follow Up  Family notified Yes - comment (left voice mail to return call for spouse, Loxley Cibrian)  Time family notified [phone]  Additional tests No  Adult Fall Risk Assessment  Risk Factor Category (scoring not indicated) Fall has occurred during this admission (document High fall risk)  Patient Fall Risk Level High fall risk  Adult Fall Risk Interventions  Required Bundle Interventions *See Row Information* High fall risk - low, moderate, and high requirements implemented  Additional Interventions PT/OT need assessed if change in mobility from baseline;Use of appropriate toileting equipment (bedpan, BSC, etc.)  Screening for Fall Injury Risk (To be completed on HIGH fall risk patients) - Assessing Need for Floor Mats  Risk For Fall Injury- Criteria for Floor Mats Previous fall this admission  Will Implement Floor Mats Yes  Vitals  Temp 97.7 F (36.5 C)  Temp Source Axillary  BP (!) 139/90  BP Location Right Arm  BP Method Automatic  Patient Position (if appropriate) Lying  Pulse Rate 65  Pulse Rate Source Monitor  Cardiac Rhythm NSR  Resp 18  Oxygen Therapy  O2 Device Room Air  Pain Assessment  Pain Scale 0-10  Pain Score 0 (patient stated her feelings were hurt that she fell)  Patients Stated Pain Goal 0  Neurological  Neuro (WDL) WDL  Level of Consciousness Alert  Orientation Level Oriented X4   Cognition Appropriate at baseline  Speech Clear  R Pupil Size (mm) 5  R Pupil Shape Round  R Pupil Reaction Brisk  L Pupil Size (mm) 5  L Pupil Shape Round  L Pupil Reaction Brisk  R Hand Grip Moderate  L Hand Grip Moderate  RUE Motor Strength 4  LUE Motor Strength 4  RLE Motor Strength 4  LLE Motor Strength 4  Neuro Symptoms None  Glasgow Coma Scale  Eye Opening 4  Best Verbal Response (NON-intubated) 5  Best Motor Response 6  Glasgow Coma Scale Score 15  Musculoskeletal  Musculoskeletal (WDL) X  Assistive Device BSC;Front wheel walker  Generalized Weakness Yes  Weight Bearing Restrictions Per Provider Order No  Integumentary  Integumentary (WDL) X  Skin Color Appropriate for ethnicity  Skin Condition Dry  Skin Integrity Ecchymosis  Ecchymosis Location Arm  Ecchymosis Location Orientation Bilateral  Erythema/Redness Location Arm  Erythema/Redness Location Orientation Right  Skin Turgor Non-tenting

## 2023-12-25 NOTE — Progress Notes (Signed)
 Patient requesting another medication alternative for cough.  She can have robitussin at this time.  She declines to take.  She states that nothing we give her is helping.  Messaged Dr. Charlton via secure chat to request cough medication.

## 2023-12-25 NOTE — Progress Notes (Signed)
 Physical Therapy Treatment Patient Details Name: Brittany Acosta MRN: [id number] DOB: 11/25/1950 Today's Date: 12/25/2023   History of Present Illness Patient is a 73 y/o female admitted 12/08/23 due to SOB and CP.  Found to have moderate to large pericardial effusion, underwent pericardiocentesis with drain placement 12/09/23.  PMH positive for DM, HTN, psoriatic arthropathy, fibromyalgia, h/o gout, h/o breast CA, HLD.    PT Comments  Pt has become weaker over the last several days and had assisted fall last night. Used Stedy today for transfer to/from bsc. Will need 2nd person to attempt amb again. Suspect hyponatremia and fluid overload are playing significant part in her worsening weakness. Do not feel she can manage at home and feel she will benefit from continued inpatient follow up therapy, <3 hours/day.      If plan is discharge home, recommend the following: A lot of help with walking and/or transfers;A lot of help with bathing/dressing/bathroom;Assistance with cooking/housework;Assist for transportation   Can travel by private vehicle     No  Equipment Recommendations  Rolling walker (2 wheels);Wheelchair cushion (measurements PT);Wheelchair (measurements PT)    Recommendations for Other Services       Precautions / Restrictions Precautions Precautions: Fall Recall of Precautions/Restrictions: Intact Restrictions Weight Bearing Restrictions Per Provider Order: No     Mobility  Bed Mobility Overal bed mobility: Needs Assistance Bed Mobility: Sit to Supine       Sit to supine: Supervision        Transfers Overall transfer level: Needs assistance Equipment used: Ambulation equipment used Transfers: Sit to/from Stand, Bed to chair/wheelchair/BSC Sit to Stand: Mod assist, From elevated surface, Min assist           General transfer comment: Assist to power up. Mod assist from low surface. Min assist from elevated surface. Stedy for bed to bsc to bed. Transfer via  Lift Equipment: Stedy  Ambulation/Gait               General Gait Details: Did not attempt   Stairs             Wheelchair Mobility     Tilt Bed    Modified Rankin (Stroke Patients Only)       Balance Overall balance assessment: Needs assistance Sitting-balance support: Feet supported Sitting balance-Leahy Scale: Fair     Standing balance support: Bilateral upper extremity supported, During functional activity, Reliant on assistive device for balance Standing balance-Leahy Scale: Poor Standing balance comment: Stedy and CGA for static standing                            Communication Communication Communication: No apparent difficulties  Cognition Arousal: Alert Behavior During Therapy: WFL for tasks assessed/performed, Flat affect   PT - Cognitive impairments: No apparent impairments                         Following commands: Intact      Cueing Cueing Techniques: Verbal cues, Tactile cues  Exercises      General Comments        Pertinent Vitals/Pain Pain Assessment Pain Assessment: No/denies pain    Home Living                          Prior Function            PT Goals (current goals can now be found in  the care plan section) Acute Rehab PT Goals PT Goal Formulation: With patient Time For Goal Achievement: 01/08/24 Potential to Achieve Goals: Fair Progress towards PT goals: Goals downgraded-see care plan    Frequency    Min 2X/week      PT Plan      Co-evaluation              AM-PAC PT 6 Clicks Mobility   Outcome Measure  Help needed turning from your back to your side while in a flat bed without using bedrails?: A Little Help needed moving from lying on your back to sitting on the side of a flat bed without using bedrails?: A Little Help needed moving to and from a bed to a chair (including a wheelchair)?: Total Help needed standing up from a chair using your arms (e.g.,  wheelchair or bedside chair)?: A Lot Help needed to walk in hospital room?: Total Help needed climbing 3-5 steps with a railing? : Total 6 Click Score: 11    End of Session Equipment Utilized During Treatment: Gait belt Activity Tolerance: Patient tolerated treatment well Patient left: in bed;with call bell/phone within reach;with bed alarm set;with family/visitor present Nurse Communication: Mobility status;Need for lift equipment PT Visit Diagnosis: Other abnormalities of gait and mobility (R26.89);Muscle weakness (generalized) (M62.81);History of falling (Z91.81)     Time: 8953-8879 PT Time Calculation (min) (ACUTE ONLY): 34 min  Charges:    $Therapeutic Activity: 23-37 mins PT General Charges $$ ACUTE PT VISIT: 1 Visit                     [facility] PT Acute Rehabilitation Services Office [phone]    [facility] ORN [facility] 12/25/2023, 2:01 PM

## 2023-12-25 NOTE — Progress Notes (Signed)
 [facility] Progress Note  Name: Brittany Acosta MRN: [id number] DOB: 10-Jan-1951  Chief Complaint:  Shortness of breath and chest pain  Subjective:  She had 675 mL UOP over 9/20 as well as 3 unmeasured urine voids.  She fell last night.  PT has seen her.  She has been more deconditioned recently.  I updated her husband at bedside.        Review of systems:     Some shortness of breath  No n/v Some chest pain if she is coughing   ---------------------- Background on re-consult:  Brittany Acosta is a 73 y.o. female with a history of type 2 diabetes, hypertension, scleroderma, hypothyroidism, and gout who presented to the hospital with shortness of breath and chest pain on 9/4.  She was found to have a large pericardial effusion as well as pericarditis.  She underwent pericardiocentesis earlier this admission on 9/5 with 700 mL out.  Per charting, fluid was bloody bu negative for malignancy.  She has been on colchicine  and ibuprofen .  Colchicine  was later discontinued due to diarrhea.  Nephrology is reconsulted for assistance with management of hyponatremia.  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  She was also felt to possibly be dehydrated.  She received normal saline and salt tabs and sodium improved to 130.  Zoloft  was discontinued.  She then appeared to be developing overload and so she received Lasix  and albumin  and was placed on 1200 cc fluid restriction.  Sodium was 125 then 126 and now has dropped to 124.  Nephrology is reconsulted.  She had 1.4 liters UOP over 9/18.  No malignancy noted on pericardiocentesis fluid (I reviewed the 9/5 path report).  Weight is up most recently.  She has been on doxepin  at home and here.      Intake/Output Summary (Last 24 hours) at 12/25/2023 1055 Last data filed at 12/25/2023 0748 Gross per 24 hour  Intake 1300.11 ml  Output 1150 ml  Net 150.11 ml     Vitals:  Vitals:   12/25/23 0300 12/25/23 0413 12/25/23 0748 12/25/23 0900  BP: (!) 139/90   (!) 145/59  Pulse: 65  70   Resp: 18  18   Temp: 97.7 F (36.5 C)  97.7 F (36.5 C)   TempSrc: Axillary  Oral   SpO2:    97%  Weight:  102.4 kg    Height:         Physical Exam:     General elderly female in chair in no acute distress HEENT normocephalic atraumatic extraocular movements intact sclera anicteric Neck supple trachea midline Lungs clear to auscultation bilaterally normal work of breathing at rest  Heart S1S2 no rub appreciated Abdomen soft nontender nondistended Extremities 1-2+ edema  Psych normal mood and affect Neuro awake on arrival; alert and oriented x 3 provides hx and follows commands; subjectively weak on gross exam    Medications reviewed   Labs:     Latest Ref Rng & Units 12/25/2023    3:24 AM 12/24/2023    3:27 PM 12/24/2023    6:01 AM  BMP  Glucose 70 - 99 mg/dL 876   861   BUN 8 - 23 mg/dL 17   16   Creatinine 9.55 - 1.00 mg/dL 8.97   8.72   Sodium 864 - 145 mmol/L 128  126  124   Potassium 3.5 - 5.1 mmol/L 3.6   3.6  Chloride 98 - 111 mmol/L 92   91   CO2 22 - 32 mmol/L 21   19   Calcium  8.9 - 10.3 mg/dL 8.7   8.6      Assessment/Plan:    # Hyponatremia   - Previously felt 2/2 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 Initially was felt to be dehydrated but more recently concern re: overload.  She has also been on doxepin  here (and at home) which can also  cause SIADH.  No malignancy noted on pericardiocentesis fluid.  Weights are up from prior.  I have discontinued doxepin  (last dose on 9/19).  She is s/p tolvaptan  15 mg once on 9/19 ------------------------------- - Lasix  40 mg IV once today     - We have stopped the salt tabs (which may help with the edema, as well) - Unable to discontinue NSAID's given the pericarditis per team - Agree with daily weights - strict ins/outs   # Pericardial effusion - Etiology is unclear at this time - Status  post pericardiocentesis on 9/5 - On NSAIDs  # Hypothyroidism - TSH slightly elevated - On Synthroid  per primary team  # Elevated creatinine - Her creatinine is rising likely due to the scheduled NSAID's - would decrease or adjust regimen as able as I am concerned that she will have worsening AKI (see that ibuprofen  was decreased - thank you) - We are diuresing as above to optimize volume status  - Given the same, will discontinue lisinopril  5 mg daily to reduce her risk of AKI and this can be reassessed outpatient   # HTN  - Acceptable    Disposition - continue inpatient monitoring   Katheryn JAYSON Saba, MD 12/25/2023 11:33 AM

## 2023-12-25 NOTE — Progress Notes (Addendum)
 Progress Note   Patient: Brittany Acosta FMW:[id number] DOB: 1950-12-23 DOA: 12/08/2023     17 DOS: the patient was seen and examined on 12/25/2023   Brief hospital course: 73 y.o. female with a past medical history of diabetes mellitus type 2 on insulin , essential hypertension, Psoriatec arthropathy, history of gout, history of breast cancer, hyperlipidemia who was in her usual state of health till about 2 to 3 days ago when she started developing shortness of breath along with chest pain.  CT scan chest  which raise concern for moderate to large pericardial effusion. Case was discussed with cardiology who recommended hospitalization for further evaluation.  She was taken for OR for pericardiocentesis. She went into Afib with RVR on the night of 9/9, started on amiodarone  drip on 9/10. Cardiology on board. Converted to sinus rhythm now. Now having diarrhea and vomiting and feels tired. She is also having issues with poor oral intake and hypoglycemia. Dced colchicine  on 9/15. She is from home and lives with her husband.  Assessment and Plan: Afib/flutter with RVR: Developed on the night of 9/9. Started on amiodarone  drip after amio bolus on 9/10. Cardiology had been following Continue tele monitoring. Converted to sinus rhythm. If patients goes back into Afib/Aflutter, she will need to be started on [facility] for stroke prophylaxis.   Pericardial effusion:  Unclear etiology, differential include uremic etiology, vs viral infection.  ESR is 55, CRP is 23 Echocardiogram showed moderate effusion, with JVD and symptomatic with sob and intermittent chest pain.  S/p pericardiocentesis with 9/5 with out. Pericardial fluid sent for analysis, it was bloody in appearance. Gram stain is negative and cultures are pending.  Cytology was sent in view of her  history of breast cancer and is negative for malignancy. She was empirically started on colchicine  0.6 mg BID along with ibuprofen  600 mg TID. Colchicine   dced on 9/15 because of profound diarrhea. Repeat echo shows resolution of the pericardial effusion, drain removed by cardiology on 9/8.  Now on reduced ibuprofen  dose of 400mg  TID given rising Cr   Nausea, vomiting and diarrhea: Developed on 9/13.   -diarrhea now resolved Denies abdominal pain. No fever GI panel by PCR is negative Dced colchicine  on 9/15.    Hypokalemia Prn repletion    Hyponatremia; secondary to SIADH/medications/Hypothyroidism/hypervolemia  TSH slightly high, free t4 elevated.  Recent Am cortisol is 8.8  Stopped zoloft  Clinically appears overloaded. Recent CXR reviewed, suggestive of vol overload Staff reports family bringing in PO fluids despite 1200cc restriction Baseline Wt around 97kg, peaked to 103.7kg on 9/17 Given doses of IV lasix  with IV albumin  Held lasix  for now and re-engaged Nephrology. Recs to hold doxepin  and given dose of tolvaptan  9/19. Stop salt tabs.  -Na now improving    Hypothyroidism:  Resumed synthroid . Abnormal thyroid  panel  with slightly elevated TSH and elevated free t4.  Recheck thyroid  panel in 4 weeks.      Type 2 DM with hypoglycemia:  Dced lantus  on 9/15 because of hypoglycemia. Patient has poor oral intake. Continue with SSI.     Hyperlipidemia Resumed pravastatin  20 mg daily.      Bipolar disorder Resumed Zoloft      Normocytic anemia Stable     Vitamin b12 deficiency/ Iron deficiency anemia Replacements ordered.    Ileus Abd more distended today and pt appears visibly uncomfortable Reports BM overnight and reports passing flatus Abd xray confirms ileus. Stopped oxybutynin   Downgraded to clears for now, resume diet as tolerated Encourage  ambulation with therapy  Possible pneumonia Increased nonproductive cough recently CXR with opacity in L retrocardiac lung base Afebrile. WBC up to 19.3k Empirically started rocephin  with azithro Continue antitussive  as needed  Fall -Pt reports controlled fall  overnight when trying to get to commode -Report mentions no pain or injury. On further questioning, pt states mild R hip pain. Also recalled hitting back of head lightly against hard arm rest on chair, no injury. Currently w/o head pain. No change in neuro function -Will check R hip xray     Subjective: continues coughing. Had controlled fall overnight when getting to commode. Today, does report some mild R hip pain  Physical Exam: Vitals:   12/25/23 1337 12/25/23 1338 12/25/23 1339 12/25/23 1613  BP:  (!) 152/75  (!) 153/70  Pulse:    74  Resp:    18  Temp:    98.5 F (36.9 C)  TempSrc:    Oral  SpO2: 100% 96% 100% 96%  Weight:      Height:       General exam: Conversant, in no acute distress Respiratory system: normal chest rise, clear, no audible wheezing Cardiovascular system: regular rhythm, s1-s2 Gastrointestinal system: Nondistended, nontender, pos BS Central nervous system: No seizures, no tremors Extremities: No cyanosis, no joint deformities Skin: No rashes, no pallor Psychiatry: Affect normal // no auditory hallucinations   Data Reviewed:  Labs reviewed: Na 128, K 3.6, Cr 1.02   Family Communication: Pt in room, family at bedside  Disposition: Status is: Inpatient Remains inpatient appropriate because: severity of illness  Planned Discharge Destination: Skilled nursing facility    Author: Garnette Pelt, MD 12/25/2023 4:39 PM  For on call review [email or web address].

## 2023-12-26 DIAGNOSIS — I3139 Other pericardial effusion (noninflammatory): Secondary | ICD-10-CM | POA: Diagnosis not present

## 2023-12-26 LAB — COMPREHENSIVE METABOLIC PANEL WITH GFR
ALT: 16 U/L (ref 0–44)
AST: 21 U/L (ref 15–41)
Albumin: 2.5 g/dL — ABNORMAL LOW (ref 3.5–5.0)
Alkaline Phosphatase: 98 U/L (ref 38–126)
Anion gap: 16 — ABNORMAL HIGH (ref 5–15)
BUN: 9 mg/dL (ref 8–23)
CO2: 21 mmol/L — ABNORMAL LOW (ref 22–32)
Calcium: 8.9 mg/dL (ref 8.9–10.3)
Chloride: 99 mmol/L (ref 98–111)
Creatinine, Ser: 0.77 mg/dL (ref 0.44–1.00)
GFR, Estimated: >60 mL/min (ref >60)
Glucose, Bld: 148 mg/dL — ABNORMAL HIGH (ref 70–99)
Potassium: 3.3 mmol/L — ABNORMAL LOW (ref 3.5–5.1)
Sodium: 136 mmol/L (ref 135–145)
Total Bilirubin: 0.6 mg/dL (ref 0.0–1.2)
Total Protein: 5.7 g/dL — ABNORMAL LOW (ref 6.5–8.1)

## 2023-12-26 LAB — CBC
HCT: 26 % — ABNORMAL LOW (ref 36.0–46.0)
Hemoglobin: 8.6 g/dL — ABNORMAL LOW (ref 12.0–15.0)
MCH: 27.4 pg (ref 26.0–34.0)
MCHC: 33.1 g/dL (ref 30.0–36.0)
MCV: 82.8 fL (ref 80.0–100.0)
Platelets: 778 K/uL — ABNORMAL HIGH (ref 150–400)
RBC: 3.14 MIL/uL — ABNORMAL LOW (ref 3.87–5.11)
RDW: 15.2 % (ref 11.5–15.5)
WBC: 10.8 K/uL — ABNORMAL HIGH (ref 4.0–10.5)
nRBC: 0 % (ref 0.0–0.2)

## 2023-12-26 LAB — GLUCOSE, CAPILLARY
Glucose-Capillary: 137 mg/dL — ABNORMAL HIGH (ref 70–99)
Glucose-Capillary: 149 mg/dL — ABNORMAL HIGH (ref 70–99)
Glucose-Capillary: 111 mg/dL — ABNORMAL HIGH (ref 70–99)
Glucose-Capillary: 99 mg/dL (ref 70–99)

## 2023-12-26 MED ORDER — QUETIAPINE FUMARATE 25 MG PO TABS
25.0000 mg | ORAL_TABLET | Freq: Every day | ORAL | Status: DC
  Administered 2023-12-26 – 2024-01-01 (×7): 25 mg via ORAL
  Filled 2023-12-26 (×7): qty 1

## 2023-12-26 MED ORDER — HALOPERIDOL LACTATE 5 MG/ML IJ SOLN: 5.0000 mg | Freq: Four times a day (QID) | INTRAMUSCULAR | Status: DC | PRN

## 2023-12-26 NOTE — Progress Notes (Signed)
 Phlebotomy personnel found patient lying horizontal in bed.  Bed alarm was on.  Upon assessment, patient stated she was trying to get up to go to the bathroom, patient oriented to month and year, stated she knew she should be in the hospital but did not believe she was in the hospital.  CBG 137, no other neurological deficits.  VSS WNL, sats 97% on room air.  Patient also removed heart monitor and not cooperating to have it put back on.  Dr. Charlton notified and advised to continue to monitor patient behavior for now.

## 2023-12-26 NOTE — TOC PASRR Note (Signed)
 RE: Brittany Acosta  Date of Birth: January 08, 1951  Date: 12/26/2023    To Whom It May Concern:   Please be advised that the above-named patient will require a short-term nursing home stay - anticipated 30 days or less for rehabilitation and strengthening. The plan is for return home.

## 2023-12-26 NOTE — Progress Notes (Signed)
 Progress Note   Patient: Brittany Acosta FMW:[id number] DOB: 26-Mar-1951 DOA: 12/08/2023     18 DOS: the patient was seen and examined on 12/26/2023   Brief hospital course: 73 y.o. female with a past medical history of diabetes mellitus type 2 on insulin , essential hypertension, Psoriatec arthropathy, history of gout, history of breast cancer, hyperlipidemia who was in her usual state of health till about 2 to 3 days ago when she started developing shortness of breath along with chest pain.  CT scan chest  which raise concern for moderate to large pericardial effusion. Case was discussed with cardiology who recommended hospitalization for further evaluation.  She was taken for OR for pericardiocentesis. She went into Afib with RVR on the night of 9/9, started on amiodarone  drip on 9/10. Cardiology on board. Converted to sinus rhythm now. Now having diarrhea and vomiting and feels tired. She is also having issues with poor oral intake and hypoglycemia. Dced colchicine  on 9/15. She is from home and lives with her husband.  Assessment and Plan: Afib/flutter with RVR: Developed on the night of 9/9. Started on amiodarone  drip after amio bolus on 9/10. Cardiology had been following Continue tele monitoring. Converted to sinus rhythm. If patients goes back into Afib/Aflutter, she will need to be started on [facility] for stroke prophylaxis.   Pericardial effusion:  Unclear etiology, differential include uremic etiology, vs viral infection.  ESR is 55, CRP is 23 Echocardiogram showed moderate effusion, with JVD and symptomatic with sob and intermittent chest pain.  S/p pericardiocentesis with 9/5 with out. Pericardial fluid sent for analysis, it was bloody in appearance. Gram stain is negative and cultures are pending.  Cytology was sent in view of her  history of breast cancer and is negative for malignancy. She was empirically started on colchicine  0.6 mg BID along with ibuprofen  600 mg TID. Colchicine   dced on 9/15 because of profound diarrhea. Repeat echo shows resolution of the pericardial effusion, drain removed by cardiology on 9/8.  Now on reduced ibuprofen  dose of 400mg  TID given rising Cr   Nausea, vomiting and diarrhea: Developed on 9/13.   -diarrhea now resolved Denies abdominal pain. No fever GI panel by PCR is negative Dced colchicine  on 9/15.    Hypokalemia Will correct    Hyponatremia; secondary to SIADH/medications/Hypothyroidism/hypervolemia  TSH slightly high, free t4 elevated.  Recent Am cortisol is 8.8  Stopped zoloft  Clinically appears overloaded. Recent CXR reviewed, suggestive of vol overload Staff reports family bringing in PO fluids despite 1200cc restriction Baseline Wt around 97kg, peaked to 103.7kg on 9/17 Given doses of IV lasix  with IV albumin  Held lasix  for now and re-engaged Nephrology. Recs to hold doxepin  and given dose of tolvaptan  9/19. Stop salt tabs.  -Na now improved    Hypothyroidism:  Resumed synthroid . Abnormal thyroid  panel  with slightly elevated TSH and elevated free t4.  Recheck thyroid  panel in 4 weeks.     Type 2 DM with hypoglycemia:  Dced lantus  on 9/15 because of hypoglycemia. Patient has poor oral intake. Continue with SSI.    Hyperlipidemia Resumed pravastatin  20 mg daily.      Bipolar disorder Resumed Zoloft      Normocytic anemia Stable     Vitamin b12 deficiency/ Iron deficiency anemia Replacements ordered.    Ileus Abd more distended today and pt appears visibly uncomfortable Reports BM overnight and reports passing flatus Abd xray confirms ileus. Stopped oxybutynin   Improving Will advance to full liquid  Possible pneumonia Increased nonproductive cough  recently CXR with opacity in L retrocardiac lung base Afebrile. WBC up to 19.3k, now trending down Empirically started rocephin  with azithro Continue antitussive  as needed  Fall -Pt reports controlled fall overnight when trying to get to  commode -Report mentions no pain or injury. On further questioning, pt states mild R hip pain. Also recalled hitting back of head lightly against hard arm rest on chair, no injury. Currently w/o head pain. No change in neuro function -Hip xray neg     Subjective: Did not sleep well overnight. Noted to be confused early this AM  Physical Exam: Vitals:   12/26/23 0459 12/26/23 0500 12/26/23 0723 12/26/23 1200  BP: (!) 126/59  (!) 152/62 (!) 163/56  Pulse: 61   81  Resp: 18  13 15   Temp: 97.7 F (36.5 C)  98 F (36.7 C) 100 F (37.8 C)  TempSrc: Oral   Oral  SpO2: 98%  (!) 87% 97%  Weight:  100.3 kg    Height:       General exam: Conversant, in no acute distress Respiratory system: normal chest rise, clear, no audible wheezing Cardiovascular system: regular rhythm, s1-s2 Gastrointestinal system: Nondistended, nontender, pos BS Central nervous system: No seizures, no tremors Extremities: No cyanosis, no joint deformities Skin: No rashes, no pallor Psychiatry: Affect normal // no auditory hallucinations   Data Reviewed:  Labs reviewed: Na 136, K 3.3 Cr 0.77, WBC 10.8, hgb 8.6, Plts 778   Family Communication: Pt in room, family at bedside  Disposition: Status is: Inpatient Remains inpatient appropriate because: severity of illness  Planned Discharge Destination: Skilled nursing facility    Author: Garnette Pelt, MD 12/26/2023 5:58 PM  For on call review [email or web address].

## 2023-12-26 NOTE — [place] FL2 (Signed)
 [place]  MEDICAID FL2 LEVEL OF CARE FORM     IDENTIFICATION  Patient Name: Brittany Acosta Birthdate: 1950/08/18 Sex: female Admission Date (Current Location): 12/08/2023  [facility] and [place] Number:  Producer, television/film/video and Address:  [facility] [place], [address], [place], [place] [address]      Provider Number: [id number]  Attending Physician Name and Address:  Cindy Garnette POUR, MD  Relative Name and Phone Number:  [name] (spouse)  [phone]    Current Level of Care: Hospital Recommended Level of Care: Skilled Nursing Facility Prior Approval Number:    Date Approved/Denied:   PASRR Number: PASRR number under review  Discharge Plan: SNF    Current Diagnoses: Patient Active Problem List   Diagnosis Date Noted   Atrial flutter (HCC) 12/15/2023   Acute idiopathic pericarditis 12/15/2023   Pericardial effusion 12/08/2023   Hyponatremia 12/08/2023   Hypomagnesemia 09/09/2022   B12 deficiency 09/09/2022   Iron deficiency anemia due to chronic blood loss 08/10/2022   Psoriatic arthropathy (HCC) 09/20/2021   Carpal tunnel syndrome of left wrist 07/02/2020   Pain of left hand 07/02/2020   Rheumatoid arthritis (HCC) 05/07/2020   Inflammatory polyarthritis (HCC) 05/07/2020   Bilateral leg edema 01/25/2020   Family history of heart disease 01/25/2020   Essential hypertension 01/25/2020   Type 2 diabetes mellitus with obesity (HCC) 01/25/2020   Right carotid bruit 12/25/2018   Stress incontinence in female 12/06/2018   Antalgic gait 12/02/2018   Malignant neoplasm of upper-outer quadrant of left breast in female, estrogen receptor positive (HCC) 05/10/2018   Temporomandibular joint (TMJ) pain 03/23/2018   Tophus of toe concurrent with and due to gout 12/16/2017   Mass in the abdomen 03/17/2017   Degenerative lumbar spinal stenosis 02/10/2017   Spondylolisthesis at L4-L5 level 02/10/2017   Cervical radiculopathy 01/18/2017   Nausea 12/23/2016    Depression, major, recurrent, moderate (HCC) 11/19/2016   Diabetic neuropathy, painful (HCC) 11/19/2016   Hypothyroidism, 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/19/2016   OSA (obstructive sleep apnea) 11/19/2016   PTSD (post-traumatic stress disorder) 11/19/2016   Chronic low back pain 11/19/2016   Gout, 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   Poorly controlled type 2 diabetes mellitus with circulatory disorder (HCC) 06/10/2015   Right bundle branch block 04/18/2015   Bifascicular block 04/18/2015   Radiculopathy, lumbar region 05/31/2014   Greater trochanteric bursitis of left hip 04/22/2014   Achilles tendinosis 02/19/2014   Gait instability 11/14/2013   Recurrent falls 11/14/2013   Multiple fractures 11/14/2013   Calculus of kidney 07/25/2013   Hypercalciuria 07/25/2013   Pain in joint involving ankle and foot 07/02/2013   Peripheral polyneuropathy 09/13/2012   Migraine 07/10/2012   Osteoarthritis of right knee 02/10/2012   Osteoarthritis of left knee 06/21/2011   GERD (gastroesophageal reflux disease) 02/09/2011   Vitamin D  deficiency 08/27/2010   Fibromyalgia 03/23/2010   Goiter 03/24/2009   Low serum potassium level 09/26/2008   Edema 09/06/2008   Morbid (severe) obesity due to excess calories (HCC) 09/03/2008   Hypothyroidism 06/10/2008   Hypercholesterolemia 06/10/2008   Bipolar 1 disorder (HCC) 06/10/2008   Hypertension associated with diabetes (HCC) 06/10/2008   Allergic rhinitis 06/10/2008    Orientation RESPIRATION BLADDER Height & Weight     Self, Time, Situation, Place  Normal Continent, External catheter (External Urinary Catheter) Weight: 221 lb 1.9 oz (100.3 kg) Height:  5' 1.5 (156.2 cm)  BEHAVIORAL SYMPTOMS/MOOD NEUROLOGICAL BOWEL NUTRITION STATUS      Continent Diet (Please see discharge summary)  AMBULATORY STATUS COMMUNICATION OF NEEDS Skin   Extensive Assist Verbally Other (Comment) (Ecchymosis,arm, Wound/Incision LDAs)                       Personal Care Assistance Level of  Assistance  Bathing, Feeding, Dressing Bathing Assistance: Maximum assistance Feeding assistance: Independent Dressing Assistance: Maximum assistance     Functional Limitations Info  Sight, Hearing, Speech Sight Info:  Financial trader) Hearing Info: Adequate Speech Info: Adequate    SPECIAL CARE FACTORS FREQUENCY  PT (By licensed PT), OT (By licensed OT)     PT Frequency: 5x min weekly OT Frequency: 5x min weekly            Contractures Contractures Info: Not present    Additional Factors Info  Code Status, Allergies Code Status Info: FULL Allergies Info: NKA           Current Medications (12/26/2023):  This is the current hospital active medication list Current Facility-Administered Medications  Medication Dose Route Frequency Provider Last Rate Last Admin   acetaminophen  (TYLENOL ) tablet 650 mg  650 mg Oral Q6H PRN Akula, Vijaya, MD   650 mg at 12/21/23 0545   Or   acetaminophen  (TYLENOL ) suppository 650 mg  650 mg Rectal Q6H PRN Cherlyn Labella, MD       albuterol  (PROVENTIL ) (2.5 MG/3ML) 0.083% nebulizer solution 2.5 mg  2.5 mg Nebulization Q4H PRN Howerter, Justin B, DO   2.5 mg at 12/24/23 0237   allopurinol  (ZYLOPRIM ) tablet 300 mg  300 mg Oral Daily Akula, Vijaya, MD   300 mg at 12/25/23 9096   amiodarone  (PACERONE ) tablet 200 mg  200 mg Oral Daily Adams, Zane, PA-C   200 mg at 12/25/23 9096   azithromycin  (ZITHROMAX ) tablet 500 mg  500 mg Oral Daily Cindy Garnette POUR, MD   500 mg at 12/25/23 9096   benzonatate  (TESSALON ) capsule 200 mg  200 mg Oral TID Cindy Garnette POUR, MD   200 mg at 12/25/23 2114   cefTRIAXone  (ROCEPHIN ) 2 g in sodium chloride  0.9 % 100 mL IVPB  2 g Intravenous Q24H Cindy Garnette POUR, MD   Stopped at 12/25/23 2151   chlorpheniramine-HYDROcodone  (TUSSIONEX) 10-8 MG/5ML suspension 5 mL  5 mL Oral Q12H Cindy Garnette POUR, MD   5 mL at 12/26/23 0501   cyclobenzaprine  (FLEXERIL ) tablet 7.5 mg  7.5 mg Oral TID PRN Akula, Vijaya, MD   7.5 mg at 12/23/23 2119    docusate sodium  (COLACE) capsule 100 mg  100 mg Oral BID Akula, Vijaya, MD   100 mg at 12/25/23 2114   ferrous sulfate  tablet 325 mg  325 mg Oral Q breakfast Akula, Vijaya, MD   325 mg at 12/25/23 0903   guaiFENesin  (ROBITUSSIN) 100 MG/5ML liquid 5 mL  5 mL Oral Q4H PRN Cindy Garnette POUR, MD   5 mL at 12/25/23 2113   haloperidol  lactate (HALDOL ) injection 5 mg  5 mg Intravenous Q6H PRN Cindy Garnette POUR, MD       hydrALAZINE  (APRESOLINE ) injection 5 mg  5 mg Intravenous Q6H PRN Sundil, Subrina, MD   5 mg at 12/17/23 0644   ibuprofen  (ADVIL ) tablet 400 mg  400 mg Oral TID Cindy Garnette POUR, MD   400 mg at 12/25/23 2114   insulin  aspart (novoLOG ) injection 0-15 Units  0-15 Units Subcutaneous TID WC Akula, Vijaya, MD   2 Units at 12/25/23 1238   lamoTRIgine  (LAMICTAL ) tablet 200 mg  200 mg Oral Q1200 Akula, Vijaya, MD  200 mg at 12/25/23 1237   levothyroxine  (SYNTHROID ) tablet 50 mcg  50 mcg Oral Daily Akula, Vijaya, MD   50 mcg at 12/26/23 0501   metoprolol  tartrate (LOPRESSOR ) tablet 25 mg  25 mg Oral BID Sundil, Subrina, MD   25 mg at 12/25/23 2115   morphine  (PF) 2 MG/ML injection 2 mg  2 mg Intravenous Q1H PRN Akula, Vijaya, MD   2 mg at 12/24/23 0759   ondansetron  (ZOFRAN ) tablet 4 mg  4 mg Oral Q6H PRN Akula, Vijaya, MD   4 mg at 12/20/23 1130   Or   ondansetron  (ZOFRAN ) injection 4 mg  4 mg Intravenous Q6H PRN Akula, Vijaya, MD   4 mg at 12/21/23 0005   Oral care mouth rinse  15 mL Mouth Rinse PRN Akula, Vijaya, MD       pantoprazole  (PROTONIX ) EC tablet 40 mg  40 mg Oral BID Akula, Vijaya, MD   40 mg at 12/25/23 2114   polyethylene glycol (MIRALAX  / GLYCOLAX ) packet 17 g  17 g Oral BID PRN Akula, Vijaya, MD   17 g at 12/12/23 2122   pravastatin  (PRAVACHOL ) tablet 20 mg  20 mg Oral QHS Akula, Vijaya, MD   20 mg at 12/25/23 2115   QUEtiapine  (SEROQUEL ) tablet 25 mg  25 mg Oral QHS Cindy Garnette POUR, MD       sodium chloride  flush (NS) 0.9 % injection 3 mL  3 mL Intravenous Q12H Akula, Vijaya, MD   3  mL at 12/25/23 2124   sodium chloride  flush (NS) 0.9 % injection 3 mL  3 mL Intravenous PRN Akula, Vijaya, MD       vitamin B-12 (CYANOCOBALAMIN ) tablet 500 mcg  500 mcg Oral Daily Akula, Vijaya, MD   500 mcg at 12/25/23 0903     Discharge Medications: Please see discharge summary for a list of discharge medications.  Relevant Imaging Results:  Relevant Lab Results:   Additional Information [id number]  Isaiah Public, LCSWA

## 2023-12-26 NOTE — TOC Progression Note (Addendum)
 [facility]) - Progression Note    Patient Details  Name: Brittany Acosta MRN: [id number] Date of Birth: 12/06/1950  [facility]) CM/SW Contact  Isaiah Public, LCSWA Phone Number: 12/26/2023, 11:48 AM  Clinical Narrative:     CSW received consult for possible SNF placement at time of discharge. Due to patients current orientation CSW spoke with patients spouse Laurier regarding PT recommendation of SNF placement at time of discharge. Patients spouse reports that PTA patient comes from home with him.Patients spouse expressed understanding of PT recommendation and is agreeable to SNF placement for patient at time of discharge. Patients spouse gave CSW permission to fax out initial referral for possible SNF placement. CSW discussed insurance authorization process and will provide Medicare SNF ratings list with accepted SNF bed offers. Patients spouse informed CSW that patient has a PCP RONAL Carrel MD.No further questions reported at this time. CSW to continue to follow and assist with discharge planning needs.   Update- Patients passr pending. CSW awaiting 30 day note and FL2 to be cosigned by MD then CSW will submit requested clinicals to [place] must for review.   Expected Discharge Plan: Skilled Nursing Facility Barriers to Discharge: Continued Medical Work up               Expected Discharge Plan and Services In-house Referral: Clinical Social Work Discharge Planning Services: CM Consult Post Acute Care Choice: Home Health Living arrangements for the past 2 months: Single Family Home                 DME Arranged: Walker rolling with seat DME Agency: [facility] Date DME Agency Contacted: 12/13/23 Time DME Agency Contacted: [phone] Representative spoke with at DME Agency: Ryan HH Arranged: PT, OT [facility] Agency: [facility] Date [facility] Agency Contacted: 12/16/23 Time HH Agency Contacted: 1600 Representative spoke with at [facility] Agency: Amy   Social Drivers of  Health (SDOH) Interventions SDOH Screenings   Food Insecurity: No Food Insecurity (12/08/2023)  Housing: Low Risk  (12/08/2023)  Transportation Needs: No Transportation Needs (12/08/2023)  Utilities: Not At Risk (12/08/2023)  Depression (PHQ2-9): Low Risk  (11/22/2023)  Financial Resource Strain: Low Risk  (09/05/2019)   Received from [facility]  Physical Activity: Unknown (09/05/2019)   Received from [facility]  Social Connections: Moderately Isolated (12/08/2023)  Stress: No Stress Concern Present (09/05/2019)   Received from [facility]  Tobacco Use: Medium Risk (12/09/2023)    Readmission Risk Interventions     No data to display

## 2023-12-26 NOTE — Progress Notes (Addendum)
 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 Family at bedside, updated 2.7 L urine output yesterday with only 1.1 L documented in Sodium yesterday 128 which was an improvement No confusion, headaches, vision changes, nausea/vomiting  09/21 0701 - 09/22 0700 In: 1160 [P.O.:960; IV Piggyback:200] Out: 2650 [Urine:2650]  Filed Weights   12/24/23 0517 12/25/23 0413 12/26/23 0500  Weight: 103.7 kg 102.4 kg 100.3 kg    Scheduled Meds:  allopurinol   300 mg Oral Daily   amiodarone   200 mg Oral Daily   azithromycin   500 mg Oral Daily   benzonatate   200 mg Oral TID   chlorpheniramine-HYDROcodone   5 mL Oral Q12H   docusate sodium   100 mg Oral BID   ferrous sulfate   325 mg Oral Q breakfast   ibuprofen   400 mg Oral TID   insulin  aspart  0-15 Units Subcutaneous TID WC   lamoTRIgine   200 mg Oral Q1200   levothyroxine   50 mcg Oral Daily   metoprolol  tartrate  25 mg Oral BID   pantoprazole   40 mg Oral BID   pravastatin   20 mg Oral QHS   QUEtiapine   25 mg Oral QHS   sodium chloride  flush  3 mL Intravenous Q12H   vitamin B-12  500 mcg Oral Daily   Continuous Infusions:  cefTRIAXone  (ROCEPHIN )  IV Stopped (12/25/23 2151)   PRN Meds:.acetaminophen  **OR** acetaminophen , albuterol , cyclobenzaprine , guaiFENesin , haloperidol  lactate, hydrALAZINE , morphine  injection, ondansetron  **OR** ondansetron  (ZOFRAN ) IV, mouth rinse, polyethylene glycol, sodium chloride  flush  Current Labs: reviewed   Latest Reference Range & Units 12/14/23 08:25 12/21/23 20:18  Osmolality, Urine 300 - 900 mOsm/kg 130 (L) 177 (L)  Sodium, Urine mmol/L 35 53  (L): Data is abnormally low   Physical Exam:  Blood pressure (!) 163/56, pulse 81, temperature 100 F (37.8 C), temperature source Oral, resp. rate 15, height 5' 1.5 (1.562 m), weight 100.3 kg, SpO2 97%. 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 Anemia, per primary  P If Serum sodium stable to improved okay for ongoing outpatient follow-up via PCP Will check back in on labs later today   Bernardino Gasman MD 12/26/2023, 12:22 PM   SNa up to 136 and SCR 0.8.  No further suggestions.  Cont fluid restriction and can f/u with PCP at discharge. WIll sign off at this time. RS  Recent Labs  Lab 12/23/23 1904 12/24/23 0600 12/24/23 0601 12/24/23 1527 12/25/23 0324  NA 122*   < > 124* 126* 128*  K 4.1  --  3.6  --  3.6  CL 88*  --  91*  --  92*  CO2 20*  --  19*  --  21*  GLUCOSE 157*  --  138*  --  123*  BUN 16  --  16  --  17  CREATININE 1.28*  --  1.27*  --  1.02*  CALCIUM  8.6*  --  8.6*  --  8.7*  PHOS  --   --   --   --  4.0   < > = values in this interval not displayed.   Recent Labs  Lab 12/20/23 1106 12/22/23 0455 12/23/23 0454 12/24/23 0600  12/26/23 0953  WBC 11.5*   < > 19.0* 18.0* 10.8*  NEUTROABS 8.8*  --   --   --   --   HGB 9.7*   < > 8.1* 8.6* 8.6*  HCT 29.8*   < > 24.7* 26.5* 26.0*  MCV 85.4   < > 82.9 84.4 82.8  PLT 668*   < > 602* 629* 778*   < > = values in this interval not displayed.

## 2023-12-27 LAB — CBC
HCT: 26.9 % — ABNORMAL LOW (ref 36.0–46.0)
Hemoglobin: 8.8 g/dL — ABNORMAL LOW (ref 12.0–15.0)
MCH: 27.2 pg (ref 26.0–34.0)
MCHC: 32.7 g/dL (ref 30.0–36.0)
MCV: 83.3 fL (ref 80.0–100.0)
Platelets: 813 K/uL — ABNORMAL HIGH (ref 150–400)
RBC: 3.23 MIL/uL — ABNORMAL LOW (ref 3.87–5.11)
RDW: 15.3 % (ref 11.5–15.5)
WBC: 9.6 K/uL (ref 4.0–10.5)
nRBC: 0 % (ref 0.0–0.2)

## 2023-12-27 LAB — GLUCOSE, CAPILLARY
Glucose-Capillary: 108 mg/dL — ABNORMAL HIGH (ref 70–99)
Glucose-Capillary: 121 mg/dL — ABNORMAL HIGH (ref 70–99)
Glucose-Capillary: 161 mg/dL — ABNORMAL HIGH (ref 70–99)
Glucose-Capillary: 68 mg/dL — ABNORMAL LOW (ref 70–99)
Glucose-Capillary: 91 mg/dL (ref 70–99)

## 2023-12-27 LAB — COMPREHENSIVE METABOLIC PANEL WITH GFR
ALT: 16 U/L (ref 0–44)
AST: 23 U/L (ref 15–41)
Albumin: 2.3 g/dL — ABNORMAL LOW (ref 3.5–5.0)
Alkaline Phosphatase: 93 U/L (ref 38–126)
Anion gap: 11 (ref 5–15)
BUN: 5 mg/dL — ABNORMAL LOW (ref 8–23)
CO2: 25 mmol/L (ref 22–32)
Calcium: 8.3 mg/dL — ABNORMAL LOW (ref 8.9–10.3)
Chloride: 101 mmol/L (ref 98–111)
Creatinine, Ser: 0.63 mg/dL (ref 0.44–1.00)
GFR, Estimated: >60 mL/min (ref >60)
Glucose, Bld: 107 mg/dL — ABNORMAL HIGH (ref 70–99)
Potassium: 2.6 mmol/L — CL (ref 3.5–5.1)
Sodium: 137 mmol/L (ref 135–145)
Total Bilirubin: 0.3 mg/dL (ref 0.0–1.2)
Total Protein: 5.5 g/dL — ABNORMAL LOW (ref 6.5–8.1)

## 2023-12-27 LAB — BASIC METABOLIC PANEL WITH GFR
Anion gap: 13 (ref 5–15)
BUN: <5 mg/dL — ABNORMAL LOW (ref 8–23)
CO2: 25 mmol/L (ref 22–32)
Calcium: 8.8 mg/dL — ABNORMAL LOW (ref 8.9–10.3)
Chloride: 97 mmol/L — ABNORMAL LOW (ref 98–111)
Creatinine, Ser: 0.55 mg/dL (ref 0.44–1.00)
GFR, Estimated: >60 mL/min (ref >60)
Glucose, Bld: 110 mg/dL — ABNORMAL HIGH (ref 70–99)
Potassium: 4 mmol/L (ref 3.5–5.1)
Sodium: 135 mmol/L (ref 135–145)

## 2023-12-27 LAB — MAGNESIUM: Magnesium: 1.3 mg/dL — ABNORMAL LOW (ref 1.7–2.4)

## 2023-12-27 MED ORDER — POTASSIUM CHLORIDE 10 MEQ/100ML IV SOLN
10.0000 meq | INTRAVENOUS | Status: AC
  Administered 2023-12-27 (×4): 10 meq via INTRAVENOUS
  Filled 2023-12-27 (×2): qty 100

## 2023-12-27 MED ORDER — GUAIFENESIN 100 MG/5ML PO LIQD: 10.0000 mL | ORAL | Status: DC | PRN

## 2023-12-27 MED ORDER — AMLODIPINE BESYLATE 5 MG PO TABS
5.0000 mg | ORAL_TABLET | Freq: Every day | ORAL | Status: DC
  Administered 2023-12-27 – 2023-12-29 (×3): 5 mg via ORAL
  Filled 2023-12-27 (×3): qty 1

## 2023-12-27 MED ORDER — MAGNESIUM SULFATE 4 GM/100ML IV SOLN
4.0000 g | Freq: Once | INTRAVENOUS | Status: AC
  Administered 2023-12-27: 4 g via INTRAVENOUS
  Filled 2023-12-27 (×2): qty 100

## 2023-12-27 MED ORDER — HYDRALAZINE HCL 20 MG/ML IJ SOLN
10.0000 mg | Freq: Four times a day (QID) | INTRAMUSCULAR | Status: DC | PRN
  Administered 2023-12-29 – 2023-12-30 (×3): 10 mg via INTRAVENOUS
  Filled 2023-12-27 (×4): qty 1

## 2023-12-27 MED ORDER — POTASSIUM CHLORIDE CRYS ER 20 MEQ PO TBCR
40.0000 meq | EXTENDED_RELEASE_TABLET | ORAL | Status: AC
Start: 2023-12-27 — End: 2023-12-27
  Administered 2023-12-27 (×2): 40 meq via ORAL
  Filled 2023-12-27 (×2): qty 2

## 2023-12-27 MED ORDER — MAGIC MOUTHWASH W/LIDOCAINE
10.0000 mL | Freq: Three times a day (TID) | ORAL | Status: DC | PRN
  Filled 2023-12-27: qty 10

## 2023-12-27 MED ORDER — LORATADINE 10 MG PO TABS
10.0000 mg | ORAL_TABLET | Freq: Every day | ORAL | Status: DC
  Administered 2023-12-27 – 2024-01-02 (×7): 10 mg via ORAL
  Filled 2023-12-27 (×7): qty 1

## 2023-12-27 NOTE — TOC Progression Note (Addendum)
 Transition of Care [facility]) - Progression Note    Patient Details  Name: Brittany Acosta MRN: [id number] Date of Birth: Feb 24, 1951  Transition of Care [facility]) CM/SW Contact  Isaiah Public, LCSWA Phone Number: 12/27/2023, 11:15 AM  Clinical Narrative:     CSW provided medicare compare ratings list of accepted SNF bed offers. Patient accepted SN[facility] bed offer with [facility]. Darrien with [facility] confirmed SNF bed for patient. CSW LVM with Tammy with [facility]. CSW awaiting call back to start insurance authorization for SNF and PTAR for patient.CSW submitted requested clinicals to [place] must for review. CSW will continue to follow.  Update- CSW received call back from Tammy with [facility]. CSW requested to start insurance authorization for SNF and PTAR. Insurance authorization currently pending for SNF and PTAR. CSW will continue to follow.  Update- Patients Passr has been approved [id number].  Expected Discharge Plan: Skilled Nursing Facility Barriers to Discharge: Continued Medical Work up               Expected Discharge Plan and Services In-house Referral: Clinical Social Work Discharge Planning Services: CM Consult Post Acute Care Choice: Home Health Living arrangements for the past 2 months: Single Family Home                 DME Arranged: Walker rolling with seat DME Agency: [facility] Date DME Agency Contacted: 12/13/23 Time DME Agency Contacted: [phone] Representative spoke with at DME Agency: Ryan HH Arranged: PT, OT [facility] Agency: [facility] Date [facility] Agency Contacted: 12/16/23 Time HH Agency Contacted: 1600 Representative spoke with at [facility] Agency: Amy   Social Drivers of Health (SDOH) Interventions SDOH Screenings   Food Insecurity: No Food Insecurity (12/08/2023)  Housing: Low Risk  (12/08/2023)  Transportation Needs: No Transportation Needs (12/08/2023)  Utilities: Not At Risk (12/08/2023)  Depression (PHQ2-9): Low Risk  (11/22/2023)  Financial Resource Strain: Low Risk   (09/05/2019)   Received from [facility]  Physical Activity: Unknown (09/05/2019)   [facility]  Social Connections: Moderately Isolated (12/08/2023)  Stress: No Stress Concern Present (09/05/2019)   Received from [facility]  Tobacco Use: Medium Risk (12/09/2023)    Readmission Risk Interventions     No data to display

## 2023-12-27 NOTE — Progress Notes (Addendum)
 Progress Note   Patient: Brittany Acosta FMW:[id number] DOB: June 12, 1950 DOA: 12/08/2023     19 DOS: the patient was seen and examined on 12/27/2023   Brief hospital course: 73 y.o. female with a past medical history of diabetes mellitus type 2 on insulin , essential hypertension, Psoriatec arthropathy, history of gout, history of breast cancer, hyperlipidemia who was in her usual state of health till about 2 to 3 days ago when she started developing shortness of breath along with chest pain.  CT scan chest  which raise concern for moderate to large pericardial effusion. Case was discussed with cardiology who recommended hospitalization for further evaluation.  She was taken for OR for pericardiocentesis. She went into Afib with RVR on the night of 9/9, started on amiodarone  drip on 9/10. Cardiology on board. Converted to sinus rhythm now. Now having diarrhea and vomiting and feels tired. She is also having issues with poor oral intake and hypoglycemia. Dced colchicine  on 9/15. She is from home and lives with her husband.  Assessment and Plan: Afib/flutter with RVR: Developed on the night of 9/9. Started on amiodarone  drip after amio bolus on 9/10. Cardiology had been following Continue tele monitoring. Converted to sinus rhythm. If patients goes back into Afib/Aflutter, she will need to be started on [facility] for stroke prophylaxis.   Pericardial effusion:  Unclear etiology, differential include uremic etiology, vs viral infection.  ESR is 55, CRP is 23 Echocardiogram showed moderate effusion, with JVD and symptomatic with sob and intermittent chest pain.  S/p pericardiocentesis with 9/5 with out. Pericardial fluid sent for analysis, it was bloody in appearance. Gram stain is negative and cultures are pending.  Cytology was sent in view of her  history of breast cancer and is negative for malignancy. She was empirically started on colchicine  0.6 mg BID along with ibuprofen  600 mg TID. Colchicine   dced on 9/15 because of profound diarrhea. Repeat echo shows resolution of the pericardial effusion, drain removed by cardiology on 9/8.  Now on reduced ibuprofen  dose of 400mg  TID given rising Cr   Nausea, vomiting and diarrhea: Developed on 9/13.   -diarrhea now resolved Denies abdominal pain. No fever GI panel by PCR is negative Dced colchicine  on 9/15.    Hypokalemia Will correct    Hyponatremia; secondary to SIADH/medications/Hypothyroidism/hypervolemia  TSH slightly high, free t4 elevated.  Recent Am cortisol is 8.8  Stopped zoloft  Clinically appears overloaded. Recent CXR reviewed, suggestive of vol overload Staff reports family bringing in PO fluids despite 1200cc restriction Baseline Wt around 97kg, peaked to 103.7kg on 9/17 Given doses of IV lasix  with IV albumin  Held lasix  for now and re-engaged Nephrology. Recs to hold doxepin  and given dose of tolvaptan  9/19. Stop salt tabs.  -Na now improved -Nephrology signed off    Hypothyroidism:  Resumed synthroid . Abnormal thyroid  panel  with slightly elevated TSH and elevated free t4.  Recheck thyroid  panel in 4 weeks.     Type 2 DM with hypoglycemia:  Dced lantus  on 9/15 because of hypoglycemia. Patient has poor oral intake. Continue with SSI.    Hyperlipidemia Resumed pravastatin  20 mg daily.      Bipolar disorder Resumed Zoloft      Normocytic anemia Stable     Vitamin b12 deficiency/ Iron deficiency anemia Replacements ordered.    Ileus Recent abd xray confirms ileus. Stopped oxybutynin   Now improving Will advance to full liquid today. Cont to advance as tolerated  Possible pneumonia Increased nonproductive cough recently CXR with opacity in  L retrocardiac lung base Afebrile. WBC up to 19.3k Empirically started rocephin  with azithro Continue antitussive  as needed WBC has normalized  Fall -Pt reports controlled fall overnight when trying to get to commode -Report mentions no pain or injury. On  further questioning, pt states mild R hip pain. Also recalled hitting back of head lightly against hard arm rest on chair, no injury. Currently w/o head pain. No change in neuro function -Hip xray neg  HTN -holding ACEI given recent concern for SIADH and ARF -Start norvasc  5mg      Subjective: Slept much better overnight with seroquel . Continues to cough frequently despite mutliple anti-tussives  Physical Exam: Vitals:   12/27/23 1129 12/27/23 1134 12/27/23 1500 12/27/23 1628  BP:  (!) 157/89 (!) 138/54 (!) 173/67  Pulse:  66 67 69  Resp:  19 17 16   Temp:  97.6 F (36.4 C)  97.8 F (36.6 C)  TempSrc:  Oral  Oral  SpO2: 100%     Weight:      Height:       General exam: Awake, laying in bed, in nad Respiratory system: Normal respiratory effort, actively dry-coughing Cardiovascular system: regular rate, s1, s2 Gastrointestinal system: Soft, nondistended, positive BS Central nervous system: CN2-12 grossly intact, strength intact Extremities: Perfused, no clubbing Skin: Normal skin turgor, no notable skin lesions seen Psychiatry: Mood normal // no visual hallucinations   Data Reviewed:  Labs reviewed: Na 135, K 4.0, Cr 0.55, WBC 9.6, Hgb 8.8, Plts 813   Family Communication: Pt in room, family at bedside  Disposition: Status is: Inpatient Remains inpatient appropriate because: severity of illness  Planned Discharge Destination: Skilled nursing facility    Author: Garnette Pelt, MD 12/27/2023 5:53 PM  For on call review [email or web address].

## 2023-12-27 NOTE — Progress Notes (Signed)
 Physical Therapy Treatment Patient Details Name: Brittany Acosta MRN: [id number] DOB: 02/26/1951 Today's Date: 12/27/2023   History of Present Illness Patient is a 73 y/o female admitted 12/08/23 due to SOB and CP.  Found to have moderate to large pericardial effusion, underwent pericardiocentesis with drain placement 12/09/23.  PMH positive for DM, HTN, psoriatic arthropathy, fibromyalgia, h/o gout, h/o breast CA, HLD.    PT Comments  Pt reports feeling better but not great. Pt able to transfer to chair with modA and RW with noted mild shakiness and report of lightheadedness. Pt with VSS. Pt fatigues quickly and also had a persistent dry cough t/o session. Acute PT to cont to follow. Continue to recommend inpatient rehab program < 3 hrs a day to progress towards safe mod I level of function prior to return home with spouse.    If plan is discharge home, recommend the following: A lot of help with walking and/or transfers;A lot of help with bathing/dressing/bathroom;Assistance with cooking/housework;Assist for transportation   Can travel by private vehicle     No  Equipment Recommendations  Rolling walker (2 wheels);Wheelchair cushion (measurements PT);Wheelchair (measurements PT)    Recommendations for Other Services       Precautions / Restrictions Precautions Precautions: Fall Recall of Precautions/Restrictions: Intact Restrictions Weight Bearing Restrictions Per Provider Order: No     Mobility  Bed Mobility Overal bed mobility: Needs Assistance Bed Mobility: Supine to Sit     Supine to sit: Supervision, HOB elevated, Used rails     General bed mobility comments: increased time, use of rails    Transfers Overall transfer level: Needs assistance Equipment used: Rolling walker (2 wheels) Transfers: Sit to/from Stand, Bed to chair/wheelchair/BSC Sit to Stand: From elevated surface, Min assist   Step pivot transfers: Mod assist       General transfer comment: Assist to  power up from EOB, modA to manage RW during step pvt to chair, increased time. pt c/o of feeling lightheaded however VSS    Ambulation/Gait               General Gait Details: Did not attempt due to mild shakiness during step pvt transfer   Stairs             Wheelchair Mobility     Tilt Bed    Modified Rankin (Stroke Patients Only)       Balance Overall balance assessment: Needs assistance Sitting-balance support: Feet supported Sitting balance-Leahy Scale: Fair     Standing balance support: Bilateral upper extremity supported, During functional activity, Reliant on assistive device for balance Standing balance-Leahy Scale: Poor Standing balance comment: reliant on RW                            Communication Communication Communication: No apparent difficulties  Cognition Arousal: Alert Behavior During Therapy: WFL for tasks assessed/performed, Flat affect   PT - Cognitive impairments: No apparent impairments                       PT - Cognition Comments: flat affect but agreeable to therapy Following commands: Intact      Cueing Cueing Techniques: Verbal cues, Tactile cues  Exercises General Exercises - Lower Extremity Ankle Circles/Pumps: AROM, Both, 10 reps, Supine Quad Sets: AROM, Both, 10 reps, Supine Gluteal Sets: AROM, Both, 5 reps, Supine    General Comments General comments (skin integrity, edema, etc.): VSS  Pertinent Vitals/Pain Pain Assessment Pain Assessment: No/denies pain Faces Pain Scale: No hurt Breathing: normal    Home Living                          Prior Function            PT Goals (current goals can now be found in the care plan section) Acute Rehab PT Goals Patient Stated Goal: to improve breathing PT Goal Formulation: With patient Time For Goal Achievement: 01/08/24 Potential to Achieve Goals: Fair Progress towards PT goals: Progressing toward goals    Frequency     Min 2X/week      PT Plan      Co-evaluation              AM-PAC PT 6 Clicks Mobility   Outcome Measure  Help needed turning from your back to your side while in a flat bed without using bedrails?: A Little Help needed moving from lying on your back to sitting on the side of a flat bed without using bedrails?: A Little Help needed moving to and from a bed to a chair (including a wheelchair)?: Total Help needed standing up from a chair using your arms (e.g., wheelchair or bedside chair)?: A Lot Help needed to walk in hospital room?: Total Help needed climbing 3-5 steps with a railing? : Total 6 Click Score: 11    End of Session Equipment Utilized During Treatment: Gait belt Activity Tolerance: Patient tolerated treatment well Patient left: with call bell/phone within reach;with family/visitor present;in chair;with chair alarm set Nurse Communication: Mobility status;Need for lift equipment PT Visit Diagnosis: Other abnormalities of gait and mobility (R26.89);Muscle weakness (generalized) (M62.81);History of falling (Z91.81)     Time: 8890-8868 PT Time Calculation (min) (ACUTE ONLY): 22 min  Charges:    $Therapeutic Activity: 8-22 mins PT General Charges $$ ACUTE PT VISIT: 1 Visit                     Norene Ames, PT, DPT Acute Rehabilitation Services Secure chat preferred Office #: [phone]    Norene CHRISTELLA Ames 12/27/2023, 1:04 PM

## 2023-12-27 NOTE — Care Management Important Message (Signed)
 Important Message  Patient Details  Name: Brittany Acosta MRN: [id number] Date of Birth: 1950-04-26   Important Message Given:  Yes - Medicare IM     [name] 12/27/2023, 10:26 AM

## 2023-12-28 ENCOUNTER — Ambulatory Visit: Attending: Internal Medicine | Admitting: Physician Assistant

## 2023-12-28 ENCOUNTER — Encounter: Admitting: Internal Medicine

## 2023-12-28 DIAGNOSIS — I3139 Other pericardial effusion (noninflammatory): Secondary | ICD-10-CM | POA: Diagnosis not present

## 2023-12-28 LAB — GLUCOSE, CAPILLARY
Glucose-Capillary: 137 mg/dL — ABNORMAL HIGH (ref 70–99)
Glucose-Capillary: 131 mg/dL — ABNORMAL HIGH (ref 70–99)
Glucose-Capillary: 117 mg/dL — ABNORMAL HIGH (ref 70–99)
Glucose-Capillary: 123 mg/dL — ABNORMAL HIGH (ref 70–99)
Glucose-Capillary: 99 mg/dL (ref 70–99)

## 2023-12-28 LAB — MAGNESIUM: Magnesium: 1.8 mg/dL (ref 1.7–2.4)

## 2023-12-28 LAB — COMPREHENSIVE METABOLIC PANEL WITH GFR
ALT: 16 U/L (ref 0–44)
AST: 23 U/L (ref 15–41)
Albumin: 2.6 g/dL — ABNORMAL LOW (ref 3.5–5.0)
Alkaline Phosphatase: 103 U/L (ref 38–126)
Anion gap: 10 (ref 5–15)
BUN: <5 mg/dL — ABNORMAL LOW (ref 8–23)
CO2: 24 mmol/L (ref 22–32)
Calcium: 8.9 mg/dL (ref 8.9–10.3)
Chloride: 100 mmol/L (ref 98–111)
Creatinine, Ser: 0.59 mg/dL (ref 0.44–1.00)
GFR, Estimated: >60 mL/min
Glucose, Bld: 139 mg/dL — ABNORMAL HIGH (ref 70–99)
Potassium: 4.5 mmol/L (ref 3.5–5.1)
Sodium: 134 mmol/L — ABNORMAL LOW (ref 135–145)
Total Bilirubin: 0.4 mg/dL (ref 0.0–1.2)
Total Protein: 5.8 g/dL — ABNORMAL LOW (ref 6.5–8.1)

## 2023-12-28 LAB — CBC
HCT: 26.3 % — ABNORMAL LOW (ref 36.0–46.0)
Hemoglobin: 8.5 g/dL — ABNORMAL LOW (ref 12.0–15.0)
MCH: 26.9 pg (ref 26.0–34.0)
MCHC: 32.3 g/dL (ref 30.0–36.0)
MCV: 83.2 fL (ref 80.0–100.0)
Platelets: 768 K/uL — ABNORMAL HIGH (ref 150–400)
RBC: 3.16 MIL/uL — ABNORMAL LOW (ref 3.87–5.11)
RDW: 15.5 % (ref 11.5–15.5)
WBC: 10.6 K/uL — ABNORMAL HIGH (ref 4.0–10.5)
nRBC: 0 % (ref 0.0–0.2)

## 2023-12-28 MED ORDER — LISINOPRIL 5 MG PO TABS
5.0000 mg | ORAL_TABLET | Freq: Every day | ORAL | Status: DC
Start: 2023-12-28 — End: 2023-12-29
  Administered 2023-12-28 – 2023-12-29 (×2): 5 mg via ORAL
  Filled 2023-12-28 (×2): qty 1

## 2023-12-28 NOTE — Progress Notes (Signed)
 Occupational Therapy Treatment Patient Details Name: Brittany Acosta MRN: [id number] DOB: Nov 18, 1950 Today's Date: 12/28/2023   History of present illness Patient is a 73 y/o female admitted 12/08/23 due to SOB and CP.  Found to have moderate to large pericardial effusion, underwent pericardiocentesis with drain placement 12/09/23.  PMH positive for DM, HTN, psoriatic arthropathy, fibromyalgia, h/o gout, h/o breast CA, HLD.   OT comments  Pt progressing well towards goals. Progressed to complete LB bathing at sink with min assist and LB dressing with mod assist. Pt also progressed to complete ~77ft of functional mobility to sink, and then back to recliner CGA with use of RW. Pt still limited by decreased activity tolerance, reporting fatigue despite completing majority of task seated. Continue to recommend <3 hours of skilled rehab daily to optimize independence levels. Will continue to follow acutely.       If plan is discharge home, recommend the following:  A lot of help with walking and/or transfers;A lot of help with bathing/dressing/bathroom;Help with stairs or ramp for entrance   Equipment Recommendations  BSC/3in1       Precautions / Restrictions Precautions Precautions: Fall Recall of Precautions/Restrictions: Intact Restrictions Weight Bearing Restrictions Per Provider Order: No       Mobility Bed Mobility   General bed mobility comments: Received in recliner    Transfers Overall transfer level: Needs assistance Equipment used: Rolling walker (2 wheels) Transfers: Sit to/from Stand Sit to Stand: Contact guard assist           General transfer comment: CGA for balance especially with fatigue. Tolerating ~24ft x2 mobility with RW     Balance Overall balance assessment: Needs assistance Sitting-balance support: Feet supported Sitting balance-Leahy Scale: Fair     Standing balance support: Bilateral upper extremity supported, During functional activity, Reliant on  assistive device for balance Standing balance-Leahy Scale: Poor Standing balance comment: Reliant on BUE support       ADL either performed or assessed with clinical judgement   ADL Overall ADL's : Needs assistance/impaired     Grooming: Oral care;Contact guard assist;Standing Grooming Details (indicate cue type and reason): CGA for balance Upper Body Bathing: Set up;Sitting   Lower Body Bathing: Minimal assistance;Sit to/from stand Lower Body Bathing Details (indicate cue type and reason): Assist for bottom, pt able to reach lower legs while seated Upper Body Dressing : Set up;Sitting   Lower Body Dressing: Moderate assistance;Sit to/from stand Lower Body Dressing Details (indicate cue type and reason): Assist to thread legs and pull above waist Toilet Transfer: Contact guard assist;Ambulation;Rolling walker (2 wheels) Toilet Transfer Details (indicate cue type and reason): Simulated in room         Functional mobility during ADLs: Contact guard assist;Rolling walker (2 wheels) General ADL Comments: Improved standing balance, fatigues quickly even with seated tasks    Extremity/Trunk Assessment Upper Extremity Assessment Upper Extremity Assessment: RUE deficits/detail RUE Deficits / Details: reports R shoulder pain with function/elevation, not formally tested   Lower Extremity Assessment Lower Extremity Assessment: Defer to PT evaluation        Vision   Vision Assessment?: No apparent visual deficits         Communication Communication Communication: No apparent difficulties   Cognition Arousal: Alert Behavior During Therapy: WFL for tasks assessed/performed, Flat affect Cognition: No apparent impairments             OT - Cognition Comments: Mild memory deficits but largely [facility]         Following  commands: Intact        Cueing   Cueing Techniques: Verbal cues, Tactile cues        General Comments VSS on RA    Pertinent Vitals/ Pain       Pain  Assessment Pain Assessment: No/denies pain Pain Intervention(s): Monitored during session   Frequency  Min 2X/week        Progress Toward Goals  OT Goals(current goals can now be found in the care plan section)  Progress towards OT goals: Progressing toward goals  Acute Rehab OT Goals Patient Stated Goal: To get better OT Goal Formulation: With patient Time For Goal Achievement: 01/07/24 Potential to Achieve Goals: Good ADL Goals Pt Will Perform Lower Body Dressing: with min assist;sit to/from stand Pt Will Transfer to Toilet: with min assist;ambulating;regular height toilet Pt Will Perform Toileting - Clothing Manipulation and hygiene: with min assist;sitting/lateral leans;sit to/from stand  Plan         AM-PAC OT 6 Clicks Daily Activity     Outcome Measure   Help from another person eating meals?: None Help from another person taking care of personal grooming?: A Little Help from another person toileting, which includes using toliet, bedpan, or urinal?: Total Help from another person bathing (including washing, rinsing, drying)?: A Little Help from another person to put on and taking off regular upper body clothing?: A Little Help from another person to put on and taking off regular lower body clothing?: A Lot 6 Click Score: 16    End of Session Equipment Utilized During Treatment: Gait belt;Rolling walker (2 wheels)  OT Visit Diagnosis: Unsteadiness on feet (R26.81);Other abnormalities of gait and mobility (R26.89);Muscle weakness (generalized) (M62.81)   Activity Tolerance Patient tolerated treatment well   Patient Left in chair;with call bell/phone within reach;with chair alarm set   Nurse Communication Mobility status        Time: 8891-8846 OT Time Calculation (min): 45 min  Charges: OT General Charges $OT Visit: 1 Visit OT Treatments $Self Care/Home Management : 38-52 mins  Brittany Acosta, OT  Acute Rehabilitation Services Office  [phone] Secure chat preferred   Brittany Acosta Savers 12/28/2023, 2:06 PM

## 2023-12-28 NOTE — Inpatient Diabetes Management (Signed)
 Inpatient Diabetes Program Recommendations  AACE/ADA: New Consensus Statement on Inpatient Glycemic Control (2015)  Target Ranges:  Prepandial:   less than 140 mg/dL      Peak postprandial:   less than 180 mg/dL (1-2 hours)      Critically ill patients:  140 - 180 mg/dL   Lab Results  Component Value Date   GLUCAP 117 (H) 12/28/2023   HGBA1C 5.6 12/09/2023    Review of Glycemic Control  Latest Reference Range & Units 12/28/23 05:42 12/28/23 07:49 12/28/23 11:55  Glucose-Capillary 70 - 99 mg/dL 862 (H) 868 (H) 882 (H)   Diabetes history: DM  Outpatient Diabetes medications:  Tresiba  23 units q AM, Metformin  1000 mg bid Current orders for Inpatient glycemic control:  Novolog  0-15 units tid with meals Inpatient Diabetes Program Recommendations:   May consider reducing Novolog  correction to sensitive (0-9 units) tid with meals.   Thanks,  Randall Bullocks, RN, BC-ADM Inpatient Diabetes Coordinator Pager [phone]  (8a-5p)

## 2023-12-28 NOTE — TOC Progression Note (Signed)
 Transition of Care [facility]) - Progression Note    Patient Details  Name: Brittany Acosta MRN: [id number] Date of Birth: 1951-03-18  [facility] CM/SW Contact  Isaiah Public, LCSWA Phone Number: 12/28/2023, 1:46 PM  Clinical Narrative:     CSW spoke with [place] with [facility] who informed CSW that patients insurance authorization for SNF and PTAR is currently under review. CSW will continue to follow.   Expected Discharge Plan: Skilled Nursing Facility Barriers to Discharge: Continued Medical Work up               Expected Discharge Plan and Services In-house Referral: Clinical Social Work Discharge Planning Services: CM Consult Post Acute Care Choice: Home Health Living arrangements for the past 2 months: Single Family Home                 DME Arranged: Walker rolling with seat DME Agency: [facility] Date DME Agency Contacted: 12/13/23 Time DME Agency Contacted: [phone] Representative spoke with at DME Agency: Ryan HH Arranged: PT, OT [facility] Agency: [facility] Date [facility] Agency Contacted: 12/16/23 Time HH Agency Contacted: 1600 Representative spoke with at [facility] Agency: Amy   Social Drivers of Health (SDOH) Interventions SDOH Screenings   Food Insecurity: No Food Insecurity (12/08/2023)  Housing: Low Risk  (12/08/2023)  Transportation Needs: No Transportation Needs (12/08/2023)  Utilities: Not At Risk (12/08/2023)  Depression (PHQ2-9): Low Risk  (11/22/2023)  Financial Resource Strain: Low Risk  (09/05/2019)   Received from [facility]  Physical Activity: Unknown (09/05/2019)   Received from [facility]  Social Connections: Moderately Isolated (12/08/2023)  Stress: No Stress Concern Present (09/05/2019)   Received from [facility]  Tobacco Use: Medium Risk (12/09/2023)    Readmission Risk Interventions     No data to display

## 2023-12-28 NOTE — Plan of Care (Signed)
 Discussed with patient plan of care for the evening, pain management and medications with some teach back displayed.  Problem: Education: Goal: Knowledge of General Education information will improve Description: Including pain rating scale, medication(s)/side effects and non-pharmacologic comfort measures Outcome: Progressing   Problem: Pain Managment: Goal: General experience of comfort will improve and/or be controlled Outcome: Progressing

## 2023-12-28 NOTE — Progress Notes (Signed)
 [facility] PROGRESS NOTE    Progress Note  Brittany Acosta  FMW:[id number] DOB: 23-Mar-1951 DOA: 12/08/2023 PCP: [name], MD     Brief Narrative:   Brittany Acosta is an 73 y.o. female past medical history of mellitus type 2 insulin -dependent last hemoglobin A1c of 5.6, essential hypertension history of breast cancer started developing shortness of breath and chest pain about 3 days prior to admission, CT angio of the chest was concerning for a large pericardial effusion, she status post pericardiocentesis in the OR, developed A-fib with RVR on 12/13/2023 started on amiodarone  drip converted back to sinus rhythm.  Developed poor oral intake eventually developed hypoglycemia, colchicine  DC on 12/19/2023. Assessment/Plan:   Acute idiopathic pericarditis/pericardial effusion: Of unclear etiology, she was started empirically on colchicine  and ibuprofen . She has completed her treatment of colchicine  on 12/19/2023 due to profound diarrhea. Repeated 2D echo showed resolution of pericardial effusion drain removed by cardiology on 12/12/2023.  Ibuprofen  with dose reduced to 400 mg 3 times daily.  A-fib with RVR: Started on amiodarone  drip converted back to sinus rhythm. She will continue amiodarone  orally as an outpatient.  Was not started on anticoagulation as this was her first episode and is thought to be secondary to her recent pericardiocentesis  Nausea vomiting and diarrhea: Now resolved question due to colchicine .  Hypokalemia: Repleted now resolved.  Hypervolemic hyponatremia: Resolved with IV diuresis.  Hypothyroidism: Continue Synthroid .  Diabetes mellitus type 2: Continue sliding scale insulin , her A1c is 5.6.  Hyperlipidemia, Continue statins.  Bipolar disorder: Continue Zoloft .  Normocytic anemia: Hemoglobin appears to be stable continue to monitor intermittently.  Vitamin B12 deficiency/iron deficiency anemia: Continue B12 replacement.  Ileus: Oxybutynin  has  been stopped this has resolved, advance to a soft diet.  Possible pneumonia: Completed course of antibiotics in house.  Mechanical fall overnight: She relates she lightly hit her head. Currently no head pain. Hip x-ray is negative.  Essential hypertension: On Norvasc , blood pressure still elevated start low-dose lisinopril .  DVT prophylaxis: lovenox  Family Communication:none Status is: Inpatient Remains inpatient appropriate because: Acute idiopathic pericarditis    Code Status:     Code Status Orders  (From admission, onward)           Start     Ordered   12/08/23 2335  Full code  Continuous       Question:  By:  Answer:  Consent: discussion documented in EHR   12/08/23 2335           Code Status History     Date Active Date Inactive Code Status Order ID Comments User Context   07/01/2016 1358 07/02/2016 0019 Full Code [id number]  Louis Shove, MD Inpatient   02/07/2012 1400 02/10/2012 2005 Full Code [id number]  Cloria Rojelio Gunner, RN Inpatient   06/18/2011 1657 06/21/2011 2010 Full Code [id number]  Stephens Landry LABOR, RN Inpatient         IV Access:   Peripheral IV   Procedures and diagnostic studies:   No results found.   Medical Consultants:   None.   Subjective:    Brittany Acosta no complaints today.  Objective:    Vitals:   12/28/23 0200 12/28/23 0321 12/28/23 0437 12/28/23 0750  BP:  (!) 163/83  (!) 151/61  Pulse:  69  73  Resp:  19  18  Temp:  98 F (36.7 C)  97.7 F (36.5 C)  TempSrc:  Oral  Oral  SpO2: 97% 100%    Weight:  100.5 kg   Height:       SpO2: 100 % O2 Flow Rate (L/min): 2 L/min FiO2 (%): 28 %   Intake/Output Summary (Last 24 hours) at 12/28/2023 0800 Last data filed at 12/28/2023 0321 Gross per 24 hour  Intake 1560 ml  Output 1825 ml  Net -265 ml   Filed Weights   12/26/23 0500 12/27/23 0500 12/28/23 0437  Weight: 100.3 kg 98.8 kg 100.5 kg    Exam: General exam: In no acute distress. Respiratory system:  Good air movement and clear to auscultation. Cardiovascular system: S1 & S2 heard, RRR. No JVD. Gastrointestinal system: Abdomen is nondistended, soft and nontender.  Extremities: No pedal edema. Skin: No rashes, lesions or ulcers Psychiatry: Judgement and insight appear normal. Mood & affect appropriate.    Data Reviewed:    Labs: Basic Metabolic Panel: Recent Labs  Lab 12/23/23 0454 12/23/23 1904 12/24/23 0601 12/24/23 1527 12/25/23 0324 12/26/23 0953 12/27/23 0446 12/27/23 1330  NA 124*   < > 124* 126* 128* 136 137 135  K 3.7   < > 3.6  --  3.6 3.3* 2.6* 4.0  CL 93*   < > 91*  --  92* 99 101 97*  CO2 21*   < > 19*  --  21* 21* 25 25  GLUCOSE 134*   < > 138*  --  123* 148* 107* 110*  BUN 12   < > 16  --  17 9 5* <5*  CREATININE 1.11*   < > 1.27*  --  1.02* 0.77 0.63 0.55  CALCIUM  8.3*   < > 8.6*  --  8.7* 8.9 8.3* 8.8*  MG 1.2*  --   --   --   --   --  1.3*  --   PHOS  --   --   --   --  4.0  --   --   --    < > = values in this interval not displayed.   GFR Estimated Creatinine Clearance: 68.8 mL/min (by C-G formula based on SCr of 0.55 mg/dL). Liver Function Tests: Recent Labs  Lab 12/22/23 0455 12/23/23 0454 12/24/23 0601 12/25/23 0324 12/26/23 0953 12/27/23 0446  AST 18 15 17   --  21 23  ALT 16 14 13   --  16 16  ALKPHOS 128* 109 121  --  98 93  BILITOT 0.5 0.7 0.3  --  0.6 0.3  PROT 5.8* 5.5* 6.2*  --  5.7* 5.5*  ALBUMIN  2.4* 2.4* 2.7* 2.6* 2.5* 2.3*   No results for input(s): LIPASE, AMYLASE in the last 168 hours. No results for input(s): AMMONIA in the last 168 hours. Coagulation profile No results for input(s): INR, PROTIME in the last 168 hours. COVID-19 Labs  No results for input(s): DDIMER, FERRITIN, LDH, CRP in the last 72 hours.  Lab Results  Component Value Date   SARSCOV2NAA NEGATIVE 12/08/2023   SARSCOV2NAA Negative 06/16/2021   SARSCOV2NAA RESULT: NEGATIVE 02/10/2021   SARSCOV2NAA Detected (A) 04/24/2020     CBC: Recent Labs  Lab 12/23/23 0454 12/24/23 0600 12/26/23 0953 12/27/23 0446 12/28/23 0544  WBC 19.0* 18.0* 10.8* 9.6 10.6*  HGB 8.1* 8.6* 8.6* 8.8* 8.5*  HCT 24.7* 26.5* 26.0* 26.9* 26.3*  MCV 82.9 84.4 82.8 83.3 83.2  PLT 602* 629* 778* 813* 768*   Cardiac Enzymes: No results for input(s): CKTOTAL, CKMB, CKMBINDEX, TROPONINI in the last 168 hours. BNP (last 3 results) Recent Labs    12/08/23 1700  PROBNP  725.0*   CBG: Recent Labs  Lab 12/27/23 1627 12/27/23 2103 12/27/23 2227 12/28/23 0542 12/28/23 0749  GLUCAP 161* 68* 91 137* 131*   D-Dimer: No results for input(s): DDIMER in the last 72 hours. Hgb A1c: No results for input(s): HGBA1C in the last 72 hours. Lipid Profile: No results for input(s): CHOL, HDL, LDLCALC, TRIG, CHOLHDL, LDLDIRECT in the last 72 hours. Thyroid  function studies: No results for input(s): TSH, T4TOTAL, T3FREE, THYROIDAB in the last 72 hours.  Invalid input(s): FREET3 Anemia work up: No results for input(s): VITAMINB12, FOLATE, FERRITIN, TIBC, IRON, RETICCTPCT in the last 72 hours. Sepsis Labs: Recent Labs  Lab 12/24/23 0600 12/26/23 0953 12/27/23 0446 12/28/23 0544  WBC 18.0* 10.8* 9.6 10.6*   Microbiology Recent Results (from the past 240 hours)  Gastrointestinal Panel by PCR , Stool     Status: None   Collection Time: 12/18/23  9:41 AM   Specimen: Stool  Result Value Ref Range Status   Campylobacter species NOT DETECTED NOT DETECTED Final   Plesimonas shigelloides NOT DETECTED NOT DETECTED Final   Salmonella species NOT DETECTED NOT DETECTED Final   Yersinia enterocolitica NOT DETECTED NOT DETECTED Final   Vibrio species NOT DETECTED NOT DETECTED Final   Vibrio cholerae NOT DETECTED NOT DETECTED Final   Enteroaggregative E coli (EAEC) NOT DETECTED NOT DETECTED Final   Enteropathogenic E coli (EPEC) NOT DETECTED NOT DETECTED Final   Enterotoxigenic E coli (ETEC) NOT  DETECTED NOT DETECTED Final   Shiga like toxin producing E coli (STEC) NOT DETECTED NOT DETECTED Final   Shigella/Enteroinvasive E coli (EIEC) NOT DETECTED NOT DETECTED Final   Cryptosporidium NOT DETECTED NOT DETECTED Final   Cyclospora cayetanensis NOT DETECTED NOT DETECTED Final   Entamoeba histolytica NOT DETECTED NOT DETECTED Final   Giardia lamblia NOT DETECTED NOT DETECTED Final   Adenovirus F40/41 NOT DETECTED NOT DETECTED Final   Astrovirus NOT DETECTED NOT DETECTED Final   Norovirus GI/GII NOT DETECTED NOT DETECTED Final   Rotavirus A NOT DETECTED NOT DETECTED Final   Sapovirus (I, II, IV, and V) NOT DETECTED NOT DETECTED Final    Comment: Performed at [facility], [address] [place], [place] [address]     Medications:    allopurinol   300 mg Oral Daily   amiodarone   200 mg Oral Daily   amLODipine   5 mg Oral Daily   azithromycin   500 mg Oral Daily   benzonatate   200 mg Oral TID   chlorpheniramine-HYDROcodone   5 mL Oral Q12H   docusate sodium   100 mg Oral BID   ferrous sulfate   325 mg Oral Q breakfast   ibuprofen   400 mg Oral TID   insulin  aspart  0-15 Units Subcutaneous TID WC   lamoTRIgine   200 mg Oral Q1200   levothyroxine   50 mcg Oral Daily   loratadine   10 mg Oral Daily   metoprolol  tartrate  25 mg Oral BID   pantoprazole   40 mg Oral BID   pravastatin   20 mg Oral QHS   QUEtiapine   25 mg Oral QHS   sodium chloride  flush  3 mL Intravenous Q12H   vitamin B-12  500 mcg Oral Daily   Continuous Infusions:    LOS: 20 days   Erle Odell Castor  [facility]  12/28/2023, 8:00 AM

## 2023-12-29 ENCOUNTER — Inpatient Hospital Stay (HOSPITAL_COMMUNITY)

## 2023-12-29 DIAGNOSIS — I3139 Other pericardial effusion (noninflammatory): Secondary | ICD-10-CM | POA: Diagnosis not present

## 2023-12-29 DIAGNOSIS — R0600 Dyspnea, unspecified: Secondary | ICD-10-CM

## 2023-12-29 LAB — CBC WITH DIFFERENTIAL/PLATELET
Abs Immature Granulocytes: 0.22 K/uL — ABNORMAL HIGH (ref 0.00–0.07)
Basophils Absolute: 0.1 K/uL (ref 0.0–0.1)
Basophils Relative: 1 %
Eosinophils Absolute: 1 K/uL — ABNORMAL HIGH (ref 0.0–0.5)
Eosinophils Relative: 8 %
HCT: 28.7 % — ABNORMAL LOW (ref 36.0–46.0)
Hemoglobin: 9.4 g/dL — ABNORMAL LOW (ref 12.0–15.0)
Immature Granulocytes: 2 %
Lymphocytes Relative: 25 %
Lymphs Abs: 3.1 K/uL (ref 0.7–4.0)
MCH: 26.9 pg (ref 26.0–34.0)
MCHC: 32.8 g/dL (ref 30.0–36.0)
MCV: 82.2 fL (ref 80.0–100.0)
Monocytes Absolute: 0.8 K/uL (ref 0.1–1.0)
Monocytes Relative: 6 %
Neutro Abs: 7.2 K/uL (ref 1.7–7.7)
Neutrophils Relative %: 58 %
Platelets: 794 K/uL — ABNORMAL HIGH (ref 150–400)
RBC: 3.49 MIL/uL — ABNORMAL LOW (ref 3.87–5.11)
RDW: 15.9 % — ABNORMAL HIGH (ref 11.5–15.5)
WBC: 12.4 K/uL — ABNORMAL HIGH (ref 4.0–10.5)
nRBC: 0 % (ref 0.0–0.2)

## 2023-12-29 LAB — GLUCOSE, CAPILLARY
Glucose-Capillary: 129 mg/dL — ABNORMAL HIGH (ref 70–99)
Glucose-Capillary: 128 mg/dL — ABNORMAL HIGH (ref 70–99)
Glucose-Capillary: 109 mg/dL — ABNORMAL HIGH (ref 70–99)
Glucose-Capillary: 131 mg/dL — ABNORMAL HIGH (ref 70–99)

## 2023-12-29 LAB — SEDIMENTATION RATE: Sed Rate: 30 mm/h — ABNORMAL HIGH (ref 0–22)

## 2023-12-29 LAB — C-REACTIVE PROTEIN: CRP: 1.5 mg/dL — ABNORMAL HIGH (ref <1.0)

## 2023-12-29 MED ORDER — LISINOPRIL 10 MG PO TABS
10.0000 mg | ORAL_TABLET | Freq: Every day | ORAL | Status: DC
  Administered 2023-12-30 – 2024-01-02 (×4): 10 mg via ORAL
  Filled 2023-12-29 (×4): qty 1

## 2023-12-29 MED ORDER — POLYETHYLENE GLYCOL 3350 17 G PO PACK
17.0000 g | PACK | Freq: Two times a day (BID) | ORAL | Status: AC
  Administered 2023-12-29 – 2023-12-30 (×3): 17 g via ORAL
  Filled 2023-12-29 (×3): qty 1

## 2023-12-29 MED ORDER — IBUPROFEN 200 MG PO TABS
200.0000 mg | ORAL_TABLET | Freq: Three times a day (TID) | ORAL | Status: DC
Start: 2023-12-29 — End: 2024-01-02
  Administered 2023-12-29 – 2024-01-02 (×12): 200 mg via ORAL
  Filled 2023-12-29 (×12): qty 1

## 2023-12-29 MED ORDER — AMLODIPINE BESYLATE 10 MG PO TABS
10.0000 mg | ORAL_TABLET | Freq: Every day | ORAL | Status: DC
  Administered 2023-12-30 – 2024-01-02 (×4): 10 mg via ORAL
  Filled 2023-12-29 (×4): qty 1

## 2023-12-29 MED ORDER — VANCOMYCIN HCL IN DEXTROSE 1-5 GM/200ML-% IV SOLN
1000.0000 mg | INTRAVENOUS | Status: DC
  Administered 2023-12-30: 1000 mg via INTRAVENOUS
  Filled 2023-12-29: qty 200

## 2023-12-29 MED ORDER — HYDRALAZINE HCL 25 MG PO TABS
25.0000 mg | ORAL_TABLET | Freq: Three times a day (TID) | ORAL | Status: DC
  Administered 2023-12-29 – 2024-01-02 (×12): 25 mg via ORAL
  Filled 2023-12-29 (×12): qty 1

## 2023-12-29 MED ORDER — VANCOMYCIN HCL 2000 MG/400ML IV SOLN
2000.0000 mg | INTRAVENOUS | Status: AC
  Administered 2023-12-29: 2000 mg via INTRAVENOUS
  Filled 2023-12-29: qty 400

## 2023-12-29 NOTE — Plan of Care (Signed)
 Problem: Education: Goal: Knowledge of General Education information will improve Description: Including pain rating scale, medication(s)/side effects and non-pharmacologic comfort measures Outcome: Progressing   Problem: Health Behavior/Discharge Planning: Goal: Ability to manage health-related needs will improve Outcome: Progressing   Problem: Clinical Measurements: Goal: Ability to maintain clinical measurements within normal limits will improve Outcome: Progressing Goal: Will remain free from infection Outcome: Progressing Goal: Diagnostic test results will improve Outcome: Progressing Goal: Respiratory complications will improve Outcome: Progressing Goal: Cardiovascular complication will be avoided Outcome: Progressing   Problem: Activity: Goal: Risk for activity intolerance will decrease Outcome: Progressing   Problem: Nutrition: Goal: Adequate nutrition will be maintained Outcome: Progressing   Problem: Coping: Goal: Level of anxiety will decrease Outcome: Progressing   Problem: Elimination: Goal: Will not experience complications related to bowel motility Outcome: Progressing Goal: Will not experience complications related to urinary retention Outcome: Progressing   Problem: Pain Managment: Goal: General experience of comfort will improve and/or be controlled Outcome: Progressing   Problem: Safety: Goal: Ability to remain free from injury will improve Outcome: Progressing   Problem: Skin Integrity: Goal: Risk for impaired skin integrity will decrease Outcome: Progressing   Problem: Education: Goal: Understanding of cardiac disease, CV risk reduction, and recovery process will improve Outcome: Progressing Goal: Individualized Educational Video(s) Outcome: Progressing   Problem: Activity: Goal: Ability to tolerate increased activity will improve Outcome: Progressing   Problem: Cardiac: Goal: Ability to achieve and maintain adequate cardiovascular  perfusion will improve Outcome: Progressing   Problem: Health Behavior/Discharge Planning: Goal: Ability to safely manage health-related needs after discharge will improve Outcome: Progressing   Problem: Education: Goal: Ability to describe self-care measures that may prevent or decrease complications (Diabetes Survival Skills Education) will improve Outcome: Progressing Goal: Individualized Educational Video(s) Outcome: Progressing   Problem: Coping: Goal: Ability to adjust to condition or change in health will improve Outcome: Progressing   Problem: Fluid Volume: Goal: Ability to maintain a balanced intake and output will improve Outcome: Progressing   Problem: Health Behavior/Discharge Planning: Goal: Ability to identify and utilize available resources and services will improve Outcome: Progressing Goal: Ability to manage health-related needs will improve Outcome: Progressing   Problem: Metabolic: Goal: Ability to maintain appropriate glucose levels will improve Outcome: Progressing   Problem: Nutritional: Goal: Maintenance of adequate nutrition will improve Outcome: Progressing Goal: Progress toward achieving an optimal weight will improve Outcome: Progressing   Problem: Skin Integrity: Goal: Risk for impaired skin integrity will decrease Outcome: Progressing   Problem: Tissue Perfusion: Goal: Adequacy of tissue perfusion will improve Outcome: Progressing   Problem: Education: Goal: Understanding of CV disease, CV risk reduction, and recovery process will improve Outcome: Progressing Goal: Individualized Educational Video(s) Outcome: Progressing   Problem: Activity: Goal: Ability to return to baseline activity level will improve Outcome: Progressing   Problem: Cardiovascular: Goal: Ability to achieve and maintain adequate cardiovascular perfusion will improve Outcome: Progressing Goal: Vascular access site(s) Level 0-1 will be maintained Outcome:  Progressing   Problem: Health Behavior/Discharge Planning: Goal: Ability to safely manage health-related needs after discharge will improve Outcome: Progressing   Problem: Activity: Goal: Ability to tolerate increased activity will improve Outcome: Progressing   Problem: Clinical Measurements: Goal: Ability to maintain a body temperature in the normal range will improve Outcome: Progressing   Problem: Respiratory: Goal: Ability to maintain adequate ventilation will improve Outcome: Progressing Goal: Ability to maintain a clear airway will improve Outcome: Progressing   Problem: Activity: Goal: Ability to tolerate increased activity will improve Outcome: Progressing  Problem: Clinical Measurements: Goal: Ability to maintain a body temperature in the normal range will improve Outcome: Progressing   Problem: Respiratory: Goal: Ability to maintain adequate ventilation will improve Outcome: Progressing Goal: Ability to maintain a clear airway will improve Outcome: Progressing

## 2023-12-29 NOTE — Progress Notes (Signed)
 Pharmacy Antibiotic Note  Brittany Acosta is a 73 y.o. female admitted on 12/08/2023 with pneumonia.  Pharmacy has been consulted for Cefepime and Vancomycin  dosing.  Plan: Cefepime 2gm IV q8h Vancomycin  2000 mg IV now then 1000 mg IV Q 24 hrs. Goal AUC 400-550. Expected AUC: 450 SCr used: 0.8  Vd coeff : 0.5 Will f/u renal function, micro data, and pt's clinical condition Vanc levels prn   Height: 5' 1.5 (156.2 cm) Weight: 99.9 kg (220 lb 3.8 oz) IBW/kg (Calculated) : 48.95  Temp (24hrs), Avg:98.1 F (36.7 C), Min:97.9 F (36.6 C), Max:98.5 F (36.9 C)  Recent Labs  Lab 12/23/23 0454 12/23/23 1904 12/24/23 0600 12/24/23 0601 12/25/23 0324 12/26/23 0953 12/27/23 0446 12/27/23 1330 12/28/23 0544  WBC 19.0*  --  18.0*  --   --  10.8* 9.6  --  10.6*  CREATININE 1.11*   < >  --    < > 1.02* 0.77 0.63 0.55 0.59   < > = values in this interval not displayed.    Estimated Creatinine Clearance: 68.6 mL/min (by C-G formula based on SCr of 0.59 mg/dL).    No Known Allergies  Antimicrobials this admission: 9/25 Vanc >>  9/25 Cefepime >>   Microbiology results: Pending  Thank you for allowing pharmacy to be a part of this patient's care.  Vito Ralph, PharmD, BCPS Please see amion for complete clinical pharmacist phone list 12/29/2023 8:01 PM

## 2023-12-29 NOTE — TOC Progression Note (Signed)
 Transition of Care [facility]) - Progression Note    Patient Details  Name: Brittany Acosta MRN: [id number] Date of Birth: Feb 08, 1951  [facility] CM/SW Contact  Isaiah Public, LCSWA Phone Number: 12/29/2023, 10:30 AM  Clinical Narrative:     CSW received call from [place] with [facility] who informed CSW that patients insurance authorization for SNF and [facility] has been approved. SNF approval # [id number] and PTAR # [id number]. Darrien with [facility] confirmed facility can accept patient when medically ready. CSW informed MD.   Expected Discharge Plan: Skilled Nursing Facility Barriers to Discharge: Continued Medical Work up               Expected Discharge Plan and Services In-house Referral: Clinical Social Work Discharge Planning Services: CM Consult Post Acute Care Choice: Home Health Living arrangements for the past 2 months: Single Family Home                 DME Arranged: Walker rolling with seat DME Agency: [facility] Date DME Agency Contacted: 12/13/23 Time DME Agency Contacted: [phone] Representative spoke with at DME Agency: Ryan HH Arranged: PT, OT [facility] Agency: [facility] Date [facility] Agency Contacted: 12/16/23 Time HH Agency Contacted: 1600 Representative spoke with at [facility] Agency: Amy   Social Drivers of Health (SDOH) Interventions SDOH Screenings   Food Insecurity: No Food Insecurity (12/08/2023)  Housing: Low Risk  (12/08/2023)  Transportation Needs: No Transportation Needs (12/08/2023)  Utilities: Not At Risk (12/08/2023)  Depression (PHQ2-9): Low Risk  (11/22/2023)  Financial Resource Strain: Low Risk  (09/05/2019)   Received from [facility]  Physical Activity: Unknown (09/05/2019)   Received from [facility]  Social Connections: Moderately Isolated (12/08/2023)  Stress: No Stress Concern Present (09/05/2019)   [facility] from [facility]  Tobacco Use: Medium Risk (12/09/2023)    Readmission Risk Interventions     No data to display

## 2023-12-29 NOTE — Progress Notes (Signed)
  Progress Note  Patient Name: Brittany Acosta Date of Encounter: 12/29/2023 [facility] Cardiologist: Shelda Bruckner, MD   Interval Summary   73 year old female with pericardial effusion s/p pericardiocentesis with drain placement  Cardiology originally signed off 12/17/2023 Asked to re-round in the setting of shortness of breath  Vital Signs Vitals:   12/29/23 0833 12/29/23 1159 12/29/23 1207 12/29/23 1539  BP: (!) 171/64 (!) 178/60 (!) 178/60 (!) 155/79  Pulse: 82 67  71  Resp:  19    Temp:  97.9 F (36.6 C)    TempSrc:  Oral    SpO2:  100%  97%  Weight:      Height:        Intake/Output Summary (Last 24 hours) at 12/29/2023 1555 Last data filed at 12/29/2023 0834 Gross per 24 hour  Intake 123 ml  Output 2050 ml  Net -1927 ml      12/29/2023    2:52 AM 12/28/2023    4:37 AM 12/27/2023    5:00 AM  Last 3 Weights  Weight (lbs) 220 lb 3.8 oz 221 lb 9 oz 217 lb 13 oz  Weight (kg) 99.9 kg 100.5 kg 98.8 kg     Telemetry/ECG  Sinus rhythm, HR 70s - Personally Reviewed  Physical Exam  GEN: No acute distress.   Neck: No JVD Cardiac: RRR, no murmurs, rubs, or gallops.  Respiratory: Clear to auscultation bilaterally. GI: Soft, nontender, non-distended  MS: No edema  Assessment & Plan   Pericardial effusion s/p pericardiocentesis Pericarditis Shortness of breath Underwent pericardiocentesis and drain placement 12/09/2023 Drain removed 9/8 Repeat echocardiogram 9/9 without reaccumulation Previously being treated with NSAIDs and colchicine  Cardiology signed off 12/17/2023 Hospitalist discontinued colchicine  9/15 due to N/V/D with improvement in symptoms  Cardiology asked to re-round on 9/25 due to recurrent shortness of breath Patient reports onset of shortness of breath started 9/24 PM Short of breath with mild activity as well as at rest CRP 23.1, ESR 55 on 9/5 Currently on ibuprofen  200 mg 3 times daily, recently decreased  Repeat limited  echocardiogram to evaluate for recurrence of pericardial effusion    Recheck CRP and ESR, last checked 9/5, can consider initiation of steroids if unable to tolerate colchicine  and inflammatory markers elevated   New onset A-fib/flutter with RVR New this admission Suspected from by her pericarditis Started on IV amiodarone , converted to NSR Not currently requiring DOAC due to initial presentation Plan to discuss DOAC if A-fib/flutter recurs Appears to be maintaining NSR  Currently on amiodarone  200 mg daily  Per primary Hypothyroidism Hypertension Diabetes Electrolyte disturbances Hyperlipidemia Bipolar disorder Ileus Possible pneumonia Mechanical fall Anemia    For questions or updates, please contact [facility] Please consult [email or web address] for contact info under         Signed, Waddell DELENA Donath, PA-C

## 2023-12-29 NOTE — Progress Notes (Signed)
 Mobility Specialist Progress Note;   12/29/23 1040  Mobility  Activity Ambulated with assistance (in room)  Level of Assistance Contact guard assist, steadying assist  Assistive Device Front wheel walker  Distance Ambulated (ft) 20 ft  Activity Response Tolerated well  Mobility Referral Yes  Mobility visit 1 Mobility  Mobility Specialist Start Time (ACUTE ONLY) 1040  Mobility Specialist Stop Time (ACUTE ONLY) 1046  Mobility Specialist Time Calculation (min) (ACUTE ONLY) 6 min   Pt in chair upon arrival, agreeable to mobility. Required MinG assistance during in room ambulation for safety. Deferred further ambulation d/t fatigue. VSS throughout. Pt returned back to chair and left with all needs met, alarm on.   Lauraine Erm Mobility Specialist Please contact via SecureChat or [employer] [phone]

## 2023-12-29 NOTE — Progress Notes (Signed)
 PT Cancellation Note  Patient Details Name: Brittany Acosta MRN: [id number] DOB: 1950-07-11   Cancelled Treatment:    Reason Eval/Treat Not Completed: Patient at procedure or test/unavailable.  Pt presently in CT scan, will see as able 9/26 12/29/2023  India HERO., PT Acute Rehabilitation Services [phone]  (office)   Vinie GAILS Terree Gaultney 12/29/2023, 5:09 PM

## 2023-12-29 NOTE — Progress Notes (Signed)
 [facility] PROGRESS NOTE    Progress Note  Brittany Acosta  FMW:[id number] DOB: 02-03-1951 DOA: 12/08/2023 PCP: [name], MD    Brief Narrative:   Brittany Acosta is an 73 y.o. female past medical history of mellitus type 2 insulin -dependent last hemoglobin A1c of 5.6, essential hypertension history of breast cancer started developing shortness of breath and chest pain about 3 days prior to admission, CT angio of the chest was concerning for a large pericardial effusion, she status post pericardiocentesis in the OR, developed A-fib with RVR on 12/13/2023 started on amiodarone  drip converted back to sinus rhythm.  Developed poor oral intake eventually developed hypoglycemia, colchicine  DC on 12/19/2023. Assessment/Plan:   Acute idiopathic pericarditis/pericardial effusion: Of unclear etiology, she was started empirically on colchicine  and ibuprofen . She has completed her treatment of colchicine  on 12/19/2023 due to profound diarrhea. Repeated 2D echo showed resolution of pericardial effusion drain removed by cardiology on 12/12/2023.  Ibuprofen  with dose reduced to 400 mg 3 times daily.  A-fib with RVR: Started on amiodarone  drip converted back to sinus rhythm. She will continue amiodarone  orally as an outpatient.  Was not started on anticoagulation as this was her first episode and is thought to be secondary to her recent pericardiocentesis  Nausea vomiting and diarrhea: Now resolved question due to colchicine .  Hypokalemia: Repleted now resolved.  Hypervolemic hyponatremia: Resolved with IV diuresis.  Hypothyroidism: Continue Synthroid .  Diabetes mellitus type 2: Continue sliding scale insulin , her A1c is 5.6.  Hyperlipidemia: Continue statins.  Bipolar disorder: Continue Zoloft .  Normocytic anemia: Hemoglobin appears to be stable continue to monitor intermittently.  Vitamin B12 deficiency/iron deficiency anemia: Continue B12 replacement.  Ileus: Oxybutynin  has  been stopped this has resolved, advance to a soft diet.  Possible pneumonia: Completed course of antibiotics in house.  Mechanical fall overnight: She relates she lightly hit her head. Currently no head pain. Hip x-ray is negative.  Essential hypertension: Increase Norvasc  and lisinopril . Continue check blood pressure and titrate antihypertensive medication as needed.  Dyspnea: In sinus rhythm rate controlled. She completed course of antibiotics. Repeat chest x-ray. She is satting greater 93% on room air.   DVT prophylaxis: lovenox  Family Communication:none Status is: Inpatient Remains inpatient appropriate because: Acute idiopathic pericarditis    Code Status:     Code Status Orders  (From admission, onward)           Start     Ordered   12/08/23 2335  Full code  Continuous       Question:  By:  Answer:  Consent: discussion documented in EHR   12/08/23 2335           Code Status History     Date Active Date Inactive Code Status Order ID Comments User Context   07/01/2016 1358 07/02/2016 0019 Full Code [id number]  Louis Shove, MD Inpatient   02/07/2012 1400 02/10/2012 2005 Full Code [id number]  Cloria Rojelio Gunner, RN Inpatient   06/18/2011 1657 06/21/2011 2010 Full Code [id number]  Stephens Landry LABOR, RN Inpatient         IV Access:   Peripheral IV   Procedures and diagnostic studies:   No results found.   Medical Consultants:   None.   Subjective:    Brittany Acosta complaining of dyspnea today  Objective:    Vitals:   12/29/23 0822 12/29/23 0830 12/29/23 0832 12/29/23 0833  BP: (!) 171/64 (!) 171/64 (!) 171/64 (!) 171/64  Pulse: 82   82  Resp:  18     Temp: 97.9 F (36.6 C)     TempSrc: Oral     SpO2:      Weight:      Height:       SpO2: 98 % O2 Flow Rate (L/min): 2 L/min FiO2 (%): 28 %   Intake/Output Summary (Last 24 hours) at 12/29/2023 1003 Last data filed at 12/29/2023 0834 Gross per 24 hour  Intake 123 ml  Output 2050 ml   Net -1927 ml   Filed Weights   12/27/23 0500 12/28/23 0437 12/29/23 0252  Weight: 98.8 kg 100.5 kg 99.9 kg    Exam: General exam: In no acute distress. Respiratory system: Air movement clear to auscultation bilaterally Cardiovascular system: S1 & S2 heard, RRR. No JVD. Gastrointestinal system: Abdomen is nondistended, soft and nontender.  Extremities: No pedal edema. Skin: No rashes, lesions or ulcers Psychiatry: Judgement and insight appear normal. Mood & affect appropriate.  Data Reviewed:    Labs: Basic Metabolic Panel: Recent Labs  Lab 12/23/23 0454 12/23/23 1904 12/25/23 0324 12/26/23 0953 12/27/23 0446 12/27/23 1330 12/28/23 0544 12/28/23 0854  NA 124*   < > 128* 136 137 135 134*  --   K 3.7   < > 3.6 3.3* 2.6* 4.0 4.5  --   CL 93*   < > 92* 99 101 97* 100  --   CO2 21*   < > 21* 21* 25 25 24   --   GLUCOSE 134*   < > 123* 148* 107* 110* 139*  --   BUN 12   < > 17 9 5* <5* <5*  --   CREATININE 1.11*   < > 1.02* 0.77 0.63 0.55 0.59  --   CALCIUM  8.3*   < > 8.7* 8.9 8.3* 8.8* 8.9  --   MG 1.2*  --   --   --  1.3*  --   --  1.8  PHOS  --   --  4.0  --   --   --   --   --    < > = values in this interval not displayed.   GFR Estimated Creatinine Clearance: 68.6 mL/min (by C-G formula based on SCr of 0.59 mg/dL). Liver Function Tests: Recent Labs  Lab 12/23/23 0454 12/24/23 0601 12/25/23 0324 12/26/23 0953 12/27/23 0446 12/28/23 0544  AST 15 17  --  21 23 23   ALT 14 13  --  16 16 16   ALKPHOS 109 121  --  98 93 103  BILITOT 0.7 0.3  --  0.6 0.3 0.4  PROT 5.5* 6.2*  --  5.7* 5.5* 5.8*  ALBUMIN  2.4* 2.7* 2.6* 2.5* 2.3* 2.6*   No results for input(s): LIPASE, AMYLASE in the last 168 hours. No results for input(s): AMMONIA in the last 168 hours. Coagulation profile No results for input(s): INR, PROTIME in the last 168 hours. COVID-19 Labs  No results for input(s): DDIMER, FERRITIN, LDH, CRP in the last 72 hours.  Lab Results   Component Value Date   SARSCOV2NAA NEGATIVE 12/08/2023   SARSCOV2NAA Negative 06/16/2021   SARSCOV2NAA RESULT: NEGATIVE 02/10/2021   SARSCOV2NAA Detected (A) 04/24/2020    CBC: Recent Labs  Lab 12/23/23 0454 12/24/23 0600 12/26/23 0953 12/27/23 0446 12/28/23 0544  WBC 19.0* 18.0* 10.8* 9.6 10.6*  HGB 8.1* 8.6* 8.6* 8.8* 8.5*  HCT 24.7* 26.5* 26.0* 26.9* 26.3*  MCV 82.9 84.4 82.8 83.3 83.2  PLT 602* 629* 778* 813* 768*   Cardiac Enzymes: No  results for input(s): CKTOTAL, CKMB, CKMBINDEX, TROPONINI in the last 168 hours. BNP (last 3 results) Recent Labs    12/08/23 1700  PROBNP 725.0*   CBG: Recent Labs  Lab 12/28/23 0749 12/28/23 1155 12/28/23 1622 12/28/23 2059 12/29/23 0818  GLUCAP 131* 117* 123* 99 129*   D-Dimer: No results for input(s): DDIMER in the last 72 hours. Hgb A1c: No results for input(s): HGBA1C in the last 72 hours. Lipid Profile: No results for input(s): CHOL, HDL, LDLCALC, TRIG, CHOLHDL, LDLDIRECT in the last 72 hours. Thyroid  function studies: No results for input(s): TSH, T4TOTAL, T3FREE, THYROIDAB in the last 72 hours.  Invalid input(s): FREET3 Anemia work up: No results for input(s): VITAMINB12, FOLATE, FERRITIN, TIBC, IRON, RETICCTPCT in the last 72 hours. Sepsis Labs: Recent Labs  Lab 12/24/23 0600 12/26/23 0953 12/27/23 0446 12/28/23 0544  WBC 18.0* 10.8* 9.6 10.6*   Microbiology No results found for this or any previous visit (from the past 240 hours).    Medications:    allopurinol   300 mg Oral Daily   amiodarone   200 mg Oral Daily   amLODipine   5 mg Oral Daily   benzonatate   200 mg Oral TID   chlorpheniramine-HYDROcodone   5 mL Oral Q12H   docusate sodium   100 mg Oral BID   ferrous sulfate   325 mg Oral Q breakfast   ibuprofen   400 mg Oral TID   insulin  aspart  0-15 Units Subcutaneous TID WC   lamoTRIgine   200 mg Oral Q1200   levothyroxine   50 mcg Oral Daily    lisinopril   5 mg Oral Daily   loratadine   10 mg Oral Daily   metoprolol  tartrate  25 mg Oral BID   pantoprazole   40 mg Oral BID   pravastatin   20 mg Oral QHS   QUEtiapine   25 mg Oral QHS   sodium chloride  flush  3 mL Intravenous Q12H   vitamin B-12  500 mcg Oral Daily   Continuous Infusions:    LOS: 21 days   Erle Odell Castor  [facility]  12/29/2023, 10:03 AM

## 2023-12-29 NOTE — Care Management Important Message (Signed)
 Important Message  Patient Details  Name: Brittany Acosta MRN: [id number] Date of Birth: June 06, 1950   Important Message Given:  Yes - Medicare IM     [name] 12/29/2023, 10:25 AM

## 2023-12-30 ENCOUNTER — Inpatient Hospital Stay (HOSPITAL_COMMUNITY)

## 2023-12-30 DIAGNOSIS — R0609 Other forms of dyspnea: Secondary | ICD-10-CM

## 2023-12-30 DIAGNOSIS — I3139 Other pericardial effusion (noninflammatory): Secondary | ICD-10-CM | POA: Diagnosis not present

## 2023-12-30 DIAGNOSIS — J90 Pleural effusion, not elsewhere classified: Secondary | ICD-10-CM

## 2023-12-30 DIAGNOSIS — I300 Acute nonspecific idiopathic pericarditis: Secondary | ICD-10-CM | POA: Diagnosis not present

## 2023-12-30 LAB — CBC WITH DIFFERENTIAL/PLATELET
Abs Immature Granulocytes: 0.2 K/uL — ABNORMAL HIGH (ref 0.00–0.07)
Basophils Absolute: 0.1 K/uL (ref 0.0–0.1)
Basophils Relative: 1 %
Eosinophils Absolute: 1 K/uL — ABNORMAL HIGH (ref 0.0–0.5)
Eosinophils Relative: 8 %
HCT: 31.8 % — ABNORMAL LOW (ref 36.0–46.0)
Hemoglobin: 10.3 g/dL — ABNORMAL LOW (ref 12.0–15.0)
Immature Granulocytes: 2 %
Lymphocytes Relative: 22 %
Lymphs Abs: 2.5 K/uL (ref 0.7–4.0)
MCH: 27.1 pg (ref 26.0–34.0)
MCHC: 32.4 g/dL (ref 30.0–36.0)
MCV: 83.7 fL (ref 80.0–100.0)
Monocytes Absolute: 0.6 K/uL (ref 0.1–1.0)
Monocytes Relative: 5 %
Neutro Abs: 7.1 K/uL (ref 1.7–7.7)
Neutrophils Relative %: 62 %
Platelets: 749 K/uL — ABNORMAL HIGH (ref 150–400)
RBC: 3.8 MIL/uL — ABNORMAL LOW (ref 3.87–5.11)
RDW: 16.4 % — ABNORMAL HIGH (ref 11.5–15.5)
WBC: 11.5 K/uL — ABNORMAL HIGH (ref 4.0–10.5)
nRBC: 0 % (ref 0.0–0.2)

## 2023-12-30 LAB — GLUCOSE, CAPILLARY
Glucose-Capillary: 131 mg/dL — ABNORMAL HIGH (ref 70–99)
Glucose-Capillary: 116 mg/dL — ABNORMAL HIGH (ref 70–99)
Glucose-Capillary: 147 mg/dL — ABNORMAL HIGH (ref 70–99)
Glucose-Capillary: 134 mg/dL — ABNORMAL HIGH (ref 70–99)

## 2023-12-30 LAB — ECHOCARDIOGRAM LIMITED
Area-P 1/2: 2.5 cm2
Calc EF: 73.5 %
Height: 61.5 in
S' Lateral: 2.9 cm
Single Plane A2C EF: 65.7 %
Single Plane A4C EF: 79.9 %
Weight: 3419.2 [oz_av]

## 2023-12-30 LAB — MAGNESIUM: Magnesium: 1.4 mg/dL — ABNORMAL LOW (ref 1.7–2.4)

## 2023-12-30 LAB — MRSA NEXT GEN BY PCR, NASAL: MRSA by PCR Next Gen: NOT DETECTED

## 2023-12-30 MED ORDER — FUROSEMIDE 10 MG/ML IJ SOLN
40.0000 mg | Freq: Once | INTRAMUSCULAR | Status: AC
Start: 2023-12-30 — End: 2023-12-30
  Administered 2023-12-30: 40 mg via INTRAVENOUS
  Filled 2023-12-30: qty 4

## 2023-12-30 MED ORDER — MAGNESIUM SULFATE 4 GM/100ML IV SOLN
4.0000 g | Freq: Once | INTRAVENOUS | Status: AC
  Administered 2023-12-30: 4 g via INTRAVENOUS
  Filled 2023-12-30: qty 100

## 2023-12-30 NOTE — Progress Notes (Signed)
  Echocardiogram 2D Echocardiogram has been performed.  Brittany Acosta 12/30/2023, 3:39 PM

## 2023-12-30 NOTE — Progress Notes (Signed)
  Progress Note  Patient Name: Brittany Acosta Date of Encounter: 12/30/2023 [facility] Cardiologist: Shelda Bruckner, MD   Interval Summary   Repeat limited echo scheduled for today. Repeat ESR 30, CRP 1.5. (down from 55 and 23.1). CT showed small right and moderate left pleural effusions - smaller pericardial effusion. She was net negative 1.1L yesterday- overall 5.6L negative.  Vital Signs Vitals:   12/30/23 0611 12/30/23 0614 12/30/23 0621 12/30/23 0750  BP: 131/64 131/64  (!) 141/56  Pulse:    67  Resp:    16  Temp:    97.7 F (36.5 C)  TempSrc:    Oral  SpO2:    95%  Weight:   96.9 kg   Height:        Intake/Output Summary (Last 24 hours) at 12/30/2023 1006 Last data filed at 12/30/2023 9376 Gross per 24 hour  Intake 360 ml  Output 1100 ml  Net -740 ml      12/30/2023    6:21 AM 12/29/2023    2:52 AM 12/28/2023    4:37 AM  Last 3 Weights  Weight (lbs) 213 lb 11.2 oz 220 lb 3.8 oz 221 lb 9 oz  Weight (kg) 96.934 kg 99.9 kg 100.5 kg     Telemetry/ECG  Sinus rhythm, HR 70s - Personally Reviewed  Physical Exam  GEN: No acute distress.   Neck: No JVD Cardiac: RRR, no murmurs, rubs, or gallops.  Respiratory: Clear to auscultation bilaterally. GI: Soft, nontender, non-distended  MS: No edema  Assessment & Plan   Pericardial effusion s/p pericardiocentesis Pericarditis Shortness of breath Underwent pericardiocentesis and drain placement 12/09/2023 Drain removed 9/8 Repeat echocardiogram 9/9 without reaccumulation Previously being treated with NSAIDs and colchicine  Cardiology signed off 12/17/2023 Hospitalist discontinued colchicine  9/15 due to N/V/D with improvement in symptoms  Cardiology asked to re-round on 9/25 due to recurrent shortness of breath Patient reports onset of shortness of breath started 9/24 PM Short of breath with mild activity as well as at rest CRP 23.1, ESR 55 on 9/5 Currently on ibuprofen  200 mg 3 times daily, recently  decreased  Repeat limited echocardiogram to evaluate for recurrence of pericardial effusion today Rechecked CRP and ESR show improvement Small to moderate bilateral pleural effusions, probably playing a role in her dyspnea Will give additional lasix  40 mg x 1 today, repeat BNP tomorrow  New onset A-fib/flutter with RVR New this admission Suspected from by her pericarditis Started on IV amiodarone , converted to NSR Not currently requiring DOAC due to initial presentation Plan to discuss DOAC if A-fib/flutter recurs Currently on amiodarone  200 mg daily Maintaining sinus rhythm  Per primary Hypothyroidism Hypertension Diabetes Electrolyte disturbances Hyperlipidemia Bipolar disorder Ileus Possible pneumonia Mechanical fall Anemia    For questions or updates, please contact [facility] Please consult [email or web address] for contact info under   Vinie KYM Maxcy, MD, [facility], FNLA, FACP    [facility]  Medical Director of the [facility]  Card[facility]ovascular Risk Reduction [facility] Diplomate of the American Board of Clinical Lipidology Attending Cardiologist  Direct Dial: [phone]  Fax: [phone]  Website:  [email or web address]  Vinie JAYSON Maxcy, MD

## 2023-12-30 NOTE — TOC Progression Note (Signed)
 Transition of Care [facility]) - Progression Note    Patient Details  Name: Brittany Acosta MRN: [id number] Date of Birth: 1950-04-16  Transition of Care [facility]) CM/SW Contact  Isaiah Public, LCSWA Phone Number: 12/30/2023, 10:19 AM  Clinical Narrative:     Patient has SNF bed at [facility] place when medically ready. Insurance authorization has been approved for SNF and PTAR.CSW will continue to follow.  Expected Discharge Plan: Skilled Nursing Facility Barriers to Discharge: Continued Medical Work up               Expected Discharge Plan and Services In-house Referral: Clinical Social Work Discharge Planning Services: CM Consult Post Acute Care Choice: Home Health Living arrangements for the past 2 months: Single Family Home                 DME Arranged: Walker rolling with seat DME Agency: [facility] Date DME Agency Contacted: 12/13/23 Time DME Agency Contacted: [phone] Representative spoke with at DME Agency: Ryan HH Arranged: PT, OT [facility] Agency: [facility] Date [facility] Agency Contacted: 12/16/23 Time HH Agency Contacted: 1600 Representative spoke with at [facility] Agency: Amy   Social Drivers of Health (SDOH) Interventions SDOH Screenings   Food Insecurity: No Food Insecurity (12/08/2023)  Housing: Low Risk  (12/08/2023)  Transportation Needs: No Transportation Needs (12/08/2023)  Utilities: Not At Risk (12/08/2023)  Depression (PHQ2-9): Low Risk  (11/22/2023)  Financial Resource Strain: Low Risk  (09/05/2019)   Received from [facility]  Physical Activity: Unknown (09/05/2019)   Received from [facility]  Social Connections: Moderately Isolated (12/08/2023)  Stress: No Stress Concern Present (09/05/2019)   Received from [facility]  Tobacco Use: Medium Risk (12/09/2023)    Readmission Risk Interventions     No data to display

## 2023-12-30 NOTE — Plan of Care (Signed)
  Problem: Clinical Measurements: Goal: Cardiovascular complication will be avoided Outcome: Progressing   Problem: Activity: Goal: Risk for activity intolerance will decrease Outcome: Progressing   Problem: Nutrition: Goal: Adequate nutrition will be maintained Outcome: Progressing   Problem: Coping: Goal: Level of anxiety will decrease Outcome: Progressing   Problem: Safety: Goal: Ability to remain free from injury will improve Outcome: Progressing   

## 2023-12-30 NOTE — Plan of Care (Signed)
 Problem: Education: Goal: Knowledge of General Education information will improve Description: Including pain rating scale, medication(s)/side effects and non-pharmacologic comfort measures Outcome: Progressing   Problem: Health Behavior/Discharge Planning: Goal: Ability to manage health-related needs will improve Outcome: Progressing   Problem: Clinical Measurements: Goal: Ability to maintain clinical measurements within normal limits will improve Outcome: Progressing Goal: Will remain free from infection Outcome: Progressing Goal: Diagnostic test results will improve Outcome: Progressing Goal: Respiratory complications will improve Outcome: Progressing Goal: Cardiovascular complication will be avoided Outcome: Progressing   Problem: Activity: Goal: Risk for activity intolerance will decrease Outcome: Progressing   Problem: Nutrition: Goal: Adequate nutrition will be maintained Outcome: Progressing   Problem: Coping: Goal: Level of anxiety will decrease Outcome: Progressing   Problem: Elimination: Goal: Will not experience complications related to bowel motility Outcome: Progressing Goal: Will not experience complications related to urinary retention Outcome: Progressing   Problem: Pain Managment: Goal: General experience of comfort will improve and/or be controlled Outcome: Progressing   Problem: Safety: Goal: Ability to remain free from injury will improve Outcome: Progressing   Problem: Skin Integrity: Goal: Risk for impaired skin integrity will decrease Outcome: Progressing   Problem: Education: Goal: Understanding of cardiac disease, CV risk reduction, and recovery process will improve Outcome: Progressing Goal: Individualized Educational Video(s) Outcome: Progressing   Problem: Activity: Goal: Ability to tolerate increased activity will improve Outcome: Progressing   Problem: Cardiac: Goal: Ability to achieve and maintain adequate cardiovascular  perfusion will improve Outcome: Progressing   Problem: Health Behavior/Discharge Planning: Goal: Ability to safely manage health-related needs after discharge will improve Outcome: Progressing   Problem: Education: Goal: Ability to describe self-care measures that may prevent or decrease complications (Diabetes Survival Skills Education) will improve Outcome: Progressing Goal: Individualized Educational Video(s) Outcome: Progressing   Problem: Coping: Goal: Ability to adjust to condition or change in health will improve Outcome: Progressing   Problem: Fluid Volume: Goal: Ability to maintain a balanced intake and output will improve Outcome: Progressing   Problem: Health Behavior/Discharge Planning: Goal: Ability to identify and utilize available resources and services will improve Outcome: Progressing Goal: Ability to manage health-related needs will improve Outcome: Progressing   Problem: Metabolic: Goal: Ability to maintain appropriate glucose levels will improve Outcome: Progressing   Problem: Nutritional: Goal: Maintenance of adequate nutrition will improve Outcome: Progressing Goal: Progress toward achieving an optimal weight will improve Outcome: Progressing   Problem: Skin Integrity: Goal: Risk for impaired skin integrity will decrease Outcome: Progressing   Problem: Tissue Perfusion: Goal: Adequacy of tissue perfusion will improve Outcome: Progressing   Problem: Education: Goal: Understanding of CV disease, CV risk reduction, and recovery process will improve Outcome: Progressing Goal: Individualized Educational Video(s) Outcome: Progressing   Problem: Activity: Goal: Ability to return to baseline activity level will improve Outcome: Progressing   Problem: Cardiovascular: Goal: Ability to achieve and maintain adequate cardiovascular perfusion will improve Outcome: Progressing Goal: Vascular access site(s) Level 0-1 will be maintained Outcome:  Progressing   Problem: Health Behavior/Discharge Planning: Goal: Ability to safely manage health-related needs after discharge will improve Outcome: Progressing   Problem: Activity: Goal: Ability to tolerate increased activity will improve Outcome: Progressing   Problem: Clinical Measurements: Goal: Ability to maintain a body temperature in the normal range will improve Outcome: Progressing   Problem: Respiratory: Goal: Ability to maintain adequate ventilation will improve Outcome: Progressing Goal: Ability to maintain a clear airway will improve Outcome: Progressing   Problem: Activity: Goal: Ability to tolerate increased activity will improve Outcome: Progressing  Problem: Clinical Measurements: Goal: Ability to maintain a body temperature in the normal range will improve Outcome: Progressing   Problem: Respiratory: Goal: Ability to maintain adequate ventilation will improve Outcome: Progressing Goal: Ability to maintain a clear airway will improve Outcome: Progressing

## 2023-12-30 NOTE — Progress Notes (Signed)
 Physical Therapy Treatment Patient Details Name: Brittany Acosta MRN: [id number] DOB: 02/21/1951 Today's Date: 12/30/2023   History of Present Illness Patient is a 73 y/o female admitted 12/08/23 due to SOB and CP.  Found to have moderate to large pericardial effusion, underwent pericardiocentesis with drain placement 12/09/23.  PMH positive for DM, HTN, psoriatic arthropathy, fibromyalgia, h/o gout, h/o breast CA, HLD.    PT Comments  Much improvement toward goals, still deconditioned.  Emphasis on warm up exercise and transitions, sit to stand without compensation and gait stability/stamina work with RW and min to light mod assist.     If plan is discharge home, recommend the following: A little help with walking and/or transfers;A little help with bathing/dressing/bathroom;Assist for transportation;Help with stairs or ramp for entrance   Can travel by private vehicle     No  Equipment Recommendations  BSC/3in1;Rolling walker (2 wheels)    Recommendations for Other Services       Precautions / Restrictions Precautions Precautions: Fall Recall of Precautions/Restrictions: Intact     Mobility  Bed Mobility Overal bed mobility: Needs Assistance Bed Mobility: Supine to Sit     Supine to sit: Supervision (no rails, no elevated HOB) Sit to supine: Supervision   General bed mobility comments: no assist needed.    Transfers Overall transfer level: Needs assistance   Transfers: Sit to/from Stand Sit to Stand: Supervision           General transfer comment: cues for safer hand placement,  no assist    Ambulation/Gait Ambulation/Gait assistance: Contact guard assist, Min assist Gait Distance (Feet): 160 Feet Assistive device: Rolling walker (2 wheels) Gait Pattern/deviations: Step-through pattern, Decreased stride length Gait velocity: slower Gait velocity interpretation: <1.8 ft/sec, indicate of risk for recurrent falls   General Gait Details: With the RW, pt was  generally steady, but as she fatigued became more unsteady, mild wob, but SpO2 mid 90's , HR 80's.   Stairs             Wheelchair Mobility     Tilt Bed    Modified Rankin (Stroke Patients Only)       Balance Overall balance assessment: Needs assistance Sitting-balance support: Feet supported Sitting balance-Leahy Scale: Fair     Standing balance support: No upper extremity supported, Bilateral upper extremity supported, During functional activity Standing balance-Leahy Scale: Fair Standing balance comment: reliant on light external support, but becoming less dependent.                            Communication Communication Communication: No apparent difficulties  Cognition Arousal: Alert Behavior During Therapy: WFL for tasks assessed/performed, Flat affect   PT - Cognitive impairments: No apparent impairments                         Following commands: Intact      Cueing Cueing Techniques: Verbal cues, Tactile cues  Exercises Other Exercises Other Exercises: bil hip/knee flex/ext ROM with graded assist/resistance x 10 reps Other Exercises: bicep/tricep presses with graded resistance x 10 reps bil    General Comments        Pertinent Vitals/Pain Pain Assessment Pain Assessment: Faces Faces Pain Scale: No hurt    Home Living                          Prior Function  PT Goals (current goals can now be found in the care plan section) Acute Rehab PT Goals Patient Stated Goal: to improve breathing PT Goal Formulation: With patient Time For Goal Achievement: 01/08/24 Potential to Achieve Goals: Fair Progress towards PT goals: Progressing toward goals    Frequency    Min 2X/week      PT Plan      Co-evaluation              AM-PAC PT 6 Clicks Mobility   Outcome Measure  Help needed turning from your back to your side while in a flat bed without using bedrails?: None Help needed moving  from lying on your back to sitting on the side of a flat bed without using bedrails?: None Help needed moving to and from a bed to a chair (including a wheelchair)?: A Little Help needed standing up from a chair using your arms (e.g., wheelchair or bedside chair)?: A Little Help needed to walk in hospital room?: A Little Help needed climbing 3-5 steps with a railing? : Total 6 Click Score: 18    End of Session   Activity Tolerance: Patient tolerated treatment well Patient left: in bed;with call bell/phone within reach Nurse Communication: Mobility status PT Visit Diagnosis: Other abnormalities of gait and mobility (R26.89);Difficulty in walking, not elsewhere classified (R26.2)     Time: 8651-8584 PT Time Calculation (min) (ACUTE ONLY): 27 min  Charges:    $Gait Training: 8-22 mins $Therapeutic Exercise: 23-37 mins PT General Charges $$ ACUTE PT VISIT: 1 Visit                     12/30/2023  Brittany Acosta., PT Acute Rehabilitation Services [phone]  (office)   Vinie GAILS Drae Mitzel 12/30/2023, 2:30 PM

## 2023-12-30 NOTE — Progress Notes (Signed)
 [facility] PROGRESS NOTE    Progress Note  Brittany Acosta  FMW:[id number] DOB: 1950/12/14 DOA: 12/08/2023 PCP: [name], MD    Brief Narrative:   Brittany Acosta is an 73 y.o. female past medical history of mellitus type 2 insulin -dependent last hemoglobin A1c of 5.6, essential hypertension history of breast cancer started developing shortness of breath and chest pain about 3 days prior to admission, CT angio of the chest was concerning for a large pericardial effusion, she status post pericardiocentesis in the OR, developed A-fib with RVR on 12/13/2023 started on amiodarone  drip converted back to sinus rhythm.  Developed poor oral intake eventually developed hypoglycemia, colchicine  DC on 12/19/2023. Assessment/Plan:   Acute idiopathic pericarditis/pericardial effusion: Of unclear etiology, she was started empirically on colchicine  and ibuprofen . She has completed her treatment of colchicine  on 12/19/2023 due to profound diarrhea. Repeated 2D echo showed resolution of pericardial effusion drain removed by cardiology on 12/12/2023.  Ibuprofen  with dose reduced to 400 mg 3 times daily.  A-fib with RVR: Started on amiodarone  drip converted back to sinus rhythm. She will continue amiodarone  orally as an outpatient.  Was not started on anticoagulation as this was her first episode and is thought to be secondary to her recent pericardiocentesis  Nausea vomiting and diarrhea: Now resolved question due to colchicine .  Hypokalemia: Repleted now resolved.  Hypervolemic hyponatremia: Resolved with IV diuresis.  Hypothyroidism: Continue Synthroid .  Diabetes mellitus type 2: Continue sliding scale insulin , her A1c is 5.6.  Hyperlipidemia: Continue statins.  Bipolar disorder: Continue Zoloft .  Normocytic anemia: Hemoglobin appears to be stable continue to monitor intermittently.  Vitamin B12 deficiency/iron deficiency anemia: Continue B12 replacement.  Ileus: Oxybutynin  has  been stopped this has resolved, advance to a soft diet.  Possible pneumonia: Completed course of antibiotics in house.  Mechanical fall overnight: She relates she lightly hit her head. Currently no head pain. Hip x-ray is negative.  Essential hypertension: Continue Norvasc  and lisinopril .  Blood pressure is improved.  Dyspnea: In sinus rhythm rate controlled.  Cardiology was reconsulted recommended to repeat a 2D echo pending ESR 30 and CRP 0.5 CT of the chest showed small right and moderate left pleural effusion with basilar consolidation progress since prior study.  With decreasing pericardial effusion. Start empirically on IV Vanco and cefepime. Still saturating greater than 96% on room air.  DVT prophylaxis: lovenox  Family Communication:none Status is: Inpatient Remains inpatient appropriate because: Acute idiopathic pericarditis    Code Status:     Code Status Orders  (From admission, onward)           Start     Ordered   12/08/23 2335  Full code  Continuous       Question:  By:  Answer:  Consent: discussion documented in EHR   12/08/23 2335           Code Status History     Date Active Date Inactive Code Status Order ID Comments User Context   07/01/2016 1358 07/02/2016 0019 Full Code [id number]  Louis Shove, MD Inpatient   02/07/2012 1400 02/10/2012 2005 Full Code [id number]  Cloria Rojelio Gunner, RN Inpatient   06/18/2011 1657 06/21/2011 2010 Full Code [id number]  Stephens Landry LABOR, RN Inpatient         IV Access:   Peripheral IV   Procedures and diagnostic studies:   CT CHEST WO CONTRAST Result Date: 12/29/2023 CLINICAL DATA:  Chronic dyspnea of unclear etiology.  Chest pain. EXAM: CT CHEST WITHOUT CONTRAST TECHNIQUE:  Multidetector CT imaging of the chest was performed following the standard protocol without IV contrast. RADIATION DOSE REDUCTION: This exam was performed according to the departmental dose-optimization program which includes automated  exposure control, adjustment of the mA and/or kV according to patient size and/or use of iterative reconstruction technique. COMPARISON:  Chest radiograph December 29, 2023. CT chest December 08, 2023. FINDINGS: Cardiovascular: Normal heart size. Small pericardial effusion. Normal caliber thoracic aorta. No significant vascular calcification. Mediastinum/Nodes: Esophagus is decompressed. Scattered lymph nodes are not pathologically enlarged. Thyroid  gland is unremarkable. Lungs/Pleura: Small right and moderate left pleural effusions. Basilar atelectasis or consolidation bilaterally. This could represent compressive atelectasis or pneumonia. These changes have progressed since the prior study. Several scattered pulmonary nodules are demonstrated. Largest is in the left upper lung peripherally measuring four mm diameter. Nodules are unchanged since prior study. No imaging followup is indicated based on size criteria. No pneumothorax. Upper Abdomen: Surgical absence of the gallbladder. No acute abnormalities. Musculoskeletal: Degenerative changes in the spine. Postoperative change in the right shoulder. Left shoulder is incompletely visualized but demonstrates cystic changes, likely representing degenerative cyst. No change since prior study. No acute bony abnormalities. IMPRESSION: Small right and moderate left pleural effusions with basilar consolidation or atelectasis. This is progressed since prior study. Small pericardial effusion is decreased. Otherwise, no change. Electronically Signed   By: Elsie Gravely M.D.   On: 12/29/2023 19:42   DG CHEST PORT 1 VIEW Result Date: 12/29/2023 CLINICAL DATA:  Pericardial effusion, chest pain, and dyspnea EXAM: PORTABLE CHEST 1 VIEW COMPARISON:  Chest radiograph dated 12/24/2023 FINDINGS: Normal lung volumes. Persistent dense left retrocardiac opacity. Trace blunting of left costophrenic angle. No pneumothorax. Similar enlarged cardiomediastinal silhouette. Right  shoulder arthroplasty. IMPRESSION: 1. Persistent dense left retrocardiac opacity, which may represent atelectasis, aspiration, or pneumonia. 2. Trace left pleural effusion. 3. Similar enlarged cardiomediastinal silhouette. Electronically Signed   By: Limin  Xu M.D.   On: 12/29/2023 14:23     Medical Consultants:   None.   Subjective:    Brittany Acosta relates her breathing is slightly better today.  Objective:    Vitals:   12/30/23 0611 12/30/23 0614 12/30/23 0621 12/30/23 0750  BP: 131/64 131/64    Pulse:    67  Resp:    16  Temp:    97.7 F (36.5 C)  TempSrc:    Oral  SpO2:      Weight:   96.9 kg   Height:       SpO2: 99 % O2 Flow Rate (L/min): 2 L/min FiO2 (%): 28 %   Intake/Output Summary (Last 24 hours) at 12/30/2023 0846 Last data filed at 12/30/2023 9376 Gross per 24 hour  Intake 360 ml  Output 1100 ml  Net -740 ml   Filed Weights   12/28/23 0437 12/29/23 0252 12/30/23 0621  Weight: 100.5 kg 99.9 kg 96.9 kg    Exam: General exam: In no acute distress. Respiratory system: Good air movement with decreased sounds on the left Cardiovascular system: S1 & S2 heard, RRR. No JVD. Gastrointestinal system: Abdomen is nondistended, soft and nontender.  Extremities: No pedal edema. Skin: No rashes, lesions or ulcers Psychiatry: Judgement and insight appear normal. Mood & affect appropriate.  Data Reviewed:    Labs: Basic Metabolic Panel: Recent Labs  Lab 12/25/23 0324 12/26/23 0953 12/27/23 0446 12/27/23 1330 12/28/23 0544 12/28/23 0854  NA 128* 136 137 135 134*  --   K 3.6 3.3* 2.6* 4.0 4.5  --  CL 92* 99 101 97* 100  --   CO2 21* 21* 25 25 24   --   GLUCOSE 123* 148* 107* 110* 139*  --   BUN 17 9 5* <5* <5*  --   CREATININE 1.02* 0.77 0.63 0.55 0.59  --   CALCIUM  8.7* 8.9 8.3* 8.8* 8.9  --   MG  --   --  1.3*  --   --  1.8  PHOS 4.0  --   --   --   --   --    GFR Estimated Creatinine Clearance: 67.4 mL/min (by C-G formula based on SCr of 0.59  mg/dL). Liver Function Tests: Recent Labs  Lab 12/24/23 0601 12/25/23 0324 12/26/23 0953 12/27/23 0446 12/28/23 0544  AST 17  --  21 23 23   ALT 13  --  16 16 16   ALKPHOS 121  --  98 93 103  BILITOT 0.3  --  0.6 0.3 0.4  PROT 6.2*  --  5.7* 5.5* 5.8*  ALBUMIN  2.7* 2.6* 2.5* 2.3* 2.6*   No results for input(s): LIPASE, AMYLASE in the last 168 hours. No results for input(s): AMMONIA in the last 168 hours. Coagulation profile No results for input(s): INR, PROTIME in the last 168 hours. COVID-19 Labs  Recent Labs    12/29/23 1634  CRP 1.5*    Lab Results  Component Value Date   SARSCOV2NAA NEGATIVE 12/08/2023   SARSCOV2NAA Negative 06/16/2021   SARSCOV2NAA RESULT: NEGATIVE 02/10/2021   SARSCOV2NAA Detected (A) 04/24/2020    CBC: Recent Labs  Lab 12/26/23 0953 12/27/23 0446 12/28/23 0544 12/29/23 1848 12/30/23 0701  WBC 10.8* 9.6 10.6* 12.4* 11.5*  NEUTROABS  --   --   --  7.2 7.1  HGB 8.6* 8.8* 8.5* 9.4* 10.3*  HCT 26.0* 26.9* 26.3* 28.7* 31.8*  MCV 82.8 83.3 83.2 82.2 83.7  PLT 778* 813* 768* 794* 749*   Cardiac Enzymes: No results for input(s): CKTOTAL, CKMB, CKMBINDEX, TROPONINI in the last 168 hours. BNP (last 3 results) Recent Labs    12/08/23 1700  PROBNP 725.0*   CBG: Recent Labs  Lab 12/29/23 0818 12/29/23 1157 12/29/23 1653 12/29/23 2211 12/30/23 0750  GLUCAP 129* 128* 109* 131* 131*   D-Dimer: No results for input(s): DDIMER in the last 72 hours. Hgb A1c: No results for input(s): HGBA1C in the last 72 hours. Lipid Profile: No results for input(s): CHOL, HDL, LDLCALC, TRIG, CHOLHDL, LDLDIRECT in the last 72 hours. Thyroid  function studies: No results for input(s): TSH, T4TOTAL, T3FREE, THYROIDAB in the last 72 hours.  Invalid input(s): FREET3 Anemia work up: No results for input(s): VITAMINB12, FOLATE, FERRITIN, TIBC, IRON, RETICCTPCT in the last 72 hours. Sepsis Labs: Recent  Labs  Lab 12/27/23 0446 12/28/23 0544 12/29/23 1848 12/30/23 0701  WBC 9.6 10.6* 12.4* 11.5*   Microbiology No results found for this or any previous visit (from the past 240 hours).    Medications:    allopurinol   300 mg Oral Daily   amiodarone   200 mg Oral Daily   amLODipine   10 mg Oral Daily   benzonatate   200 mg Oral TID   chlorpheniramine-HYDROcodone   5 mL Oral Q12H   docusate sodium   100 mg Oral BID   ferrous sulfate   325 mg Oral Q breakfast   hydrALAZINE   25 mg Oral Q8H   ibuprofen   200 mg Oral TID   insulin  aspart  0-15 Units Subcutaneous TID WC   lamoTRIgine   200 mg Oral Q1200   levothyroxine   50 mcg Oral Daily   lisinopril   10 mg Oral Daily   loratadine   10 mg Oral Daily   metoprolol  tartrate  25 mg Oral BID   pantoprazole   40 mg Oral BID   polyethylene glycol  17 g Oral BID   pravastatin   20 mg Oral QHS   QUEtiapine   25 mg Oral QHS   sodium chloride  flush  3 mL Intravenous Q12H   vitamin B-12  500 mcg Oral Daily   Continuous Infusions:  vancomycin         LOS: 22 days   Erle Odell Castor  [facility]  12/30/2023, 8:46 AM

## 2023-12-31 DIAGNOSIS — J90 Pleural effusion, not elsewhere classified: Secondary | ICD-10-CM | POA: Diagnosis not present

## 2023-12-31 DIAGNOSIS — M069 Rheumatoid arthritis, unspecified: Secondary | ICD-10-CM | POA: Diagnosis not present

## 2023-12-31 DIAGNOSIS — F331 Major depressive disorder, recurrent, moderate: Secondary | ICD-10-CM | POA: Diagnosis not present

## 2023-12-31 DIAGNOSIS — M4316 Spondylolisthesis, lumbar region: Secondary | ICD-10-CM | POA: Diagnosis not present

## 2023-12-31 DIAGNOSIS — D500 Iron deficiency anemia secondary to blood loss (chronic): Secondary | ICD-10-CM | POA: Diagnosis not present

## 2023-12-31 DIAGNOSIS — M6281 Muscle weakness (generalized): Secondary | ICD-10-CM | POA: Diagnosis not present

## 2023-12-31 DIAGNOSIS — I4892 Unspecified atrial flutter: Secondary | ICD-10-CM | POA: Diagnosis not present

## 2023-12-31 DIAGNOSIS — I3139 Other pericardial effusion (noninflammatory): Secondary | ICD-10-CM | POA: Diagnosis not present

## 2023-12-31 DIAGNOSIS — I300 Acute nonspecific idiopathic pericarditis: Secondary | ICD-10-CM | POA: Diagnosis not present

## 2023-12-31 LAB — CBC
HCT: 30.3 % — ABNORMAL LOW (ref 36.0–46.0)
Hemoglobin: 9.5 g/dL — ABNORMAL LOW (ref 12.0–15.0)
MCH: 26.6 pg (ref 26.0–34.0)
MCHC: 31.4 g/dL (ref 30.0–36.0)
MCV: 84.9 fL (ref 80.0–100.0)
Platelets: 450 K/uL — ABNORMAL HIGH (ref 150–400)
RBC: 3.57 MIL/uL — ABNORMAL LOW (ref 3.87–5.11)
RDW: 16.7 % — ABNORMAL HIGH (ref 11.5–15.5)
WBC: 9.1 K/uL (ref 4.0–10.5)
nRBC: 0 % (ref 0.0–0.2)

## 2023-12-31 LAB — BASIC METABOLIC PANEL WITH GFR
Anion gap: 12 (ref 5–15)
BUN: <5 mg/dL — ABNORMAL LOW (ref 8–23)
CO2: 29 mmol/L (ref 22–32)
Calcium: 9 mg/dL (ref 8.9–10.3)
Chloride: 95 mmol/L — ABNORMAL LOW (ref 98–111)
Creatinine, Ser: 0.66 mg/dL (ref 0.44–1.00)
GFR, Estimated: >60 mL/min (ref >60)
Glucose, Bld: 125 mg/dL — ABNORMAL HIGH (ref 70–99)
Potassium: 3.4 mmol/L — ABNORMAL LOW (ref 3.5–5.1)
Sodium: 136 mmol/L (ref 135–145)

## 2023-12-31 LAB — GLUCOSE, CAPILLARY
Glucose-Capillary: 122 mg/dL — ABNORMAL HIGH (ref 70–99)
Glucose-Capillary: 96 mg/dL (ref 70–99)
Glucose-Capillary: 160 mg/dL — ABNORMAL HIGH (ref 70–99)
Glucose-Capillary: 126 mg/dL — ABNORMAL HIGH (ref 70–99)

## 2023-12-31 LAB — BRAIN NATRIURETIC PEPTIDE: B Natriuretic Peptide: 364.7 pg/mL — ABNORMAL HIGH (ref 0.0–100.0)

## 2023-12-31 MED ORDER — SODIUM CHLORIDE 0.9% FLUSH: 10.0000 mL | INTRAVENOUS | Status: DC | PRN

## 2023-12-31 MED ORDER — SODIUM CHLORIDE 0.9% FLUSH
10.0000 mL | Freq: Two times a day (BID) | INTRAVENOUS | Status: DC
  Administered 2023-12-31 – 2024-01-02 (×6): 10 mL

## 2023-12-31 MED ORDER — MAGNESIUM OXIDE -MG SUPPLEMENT 400 (240 MG) MG PO TABS
400.0000 mg | ORAL_TABLET | Freq: Two times a day (BID) | ORAL | Status: AC
  Administered 2023-12-31 (×2): 400 mg via ORAL
  Filled 2023-12-31 (×2): qty 1

## 2023-12-31 MED ORDER — POTASSIUM CHLORIDE CRYS ER 20 MEQ PO TBCR
40.0000 meq | EXTENDED_RELEASE_TABLET | Freq: Two times a day (BID) | ORAL | Status: AC
  Administered 2023-12-31 (×2): 40 meq via ORAL
  Filled 2023-12-31 (×2): qty 2

## 2023-12-31 NOTE — Plan of Care (Signed)
   Problem: Education: Goal: Knowledge of General Education information will improve Description Including pain rating scale, medication(s)/side effects and non-pharmacologic comfort measures Outcome: Progressing   Problem: Health Behavior/Discharge Planning: Goal: Ability to manage health-related needs will improve Outcome: Progressing

## 2023-12-31 NOTE — Plan of Care (Signed)
  Problem: Education: Goal: Knowledge of General Education information will improve Description: Including pain rating scale, medication(s)/side effects and non-pharmacologic comfort measures Outcome: Progressing   Problem: Health Behavior/Discharge Planning: Goal: Ability to manage health-related needs will improve Outcome: Progressing   Problem: Clinical Measurements: Goal: Ability to maintain clinical measurements within normal limits will improve Outcome: Progressing Goal: Will remain free from infection Outcome: Progressing Goal: Diagnostic test results will improve Outcome: Progressing Goal: Respiratory complications will improve Outcome: Progressing Goal: Cardiovascular complication will be avoided Outcome: Progressing   Problem: Activity: Goal: Risk for activity intolerance will decrease Outcome: Progressing   Problem: Nutrition: Goal: Adequate nutrition will be maintained Outcome: Progressing   Problem: Coping: Goal: Level of anxiety will decrease Outcome: Progressing   Problem: Safety: Goal: Ability to remain free from injury will improve Outcome: Progressing   

## 2023-12-31 NOTE — Progress Notes (Signed)

## 2023-12-31 NOTE — Progress Notes (Signed)
  Progress Note  Patient Name: Brittany Acosta Date of Encounter: 12/31/2023 [facility] Cardiologist: Shelda Bruckner, MD   Interval Summary   Repeat limited echo did not show recurrent of effusion.  Vital Signs Vitals:   12/30/23 2319 12/31/23 0423 12/31/23 0500 12/31/23 0756  BP: (!) 169/61 (!) 158/52  (!) 152/69  Pulse: 75 (!) 58  69  Resp: 16 18  20   Temp: 98 F (36.7 C) (!) 97.3 F (36.3 C)  98.4 F (36.9 C)  TempSrc: Oral Oral  Oral  SpO2: 98% 95%    Weight:   94.8 kg   Height:        Intake/Output Summary (Last 24 hours) at 12/31/2023 1223 Last data filed at 12/31/2023 0846 Gross per 24 hour  Intake 1000.99 ml  Output 500 ml  Net 500.99 ml      12/31/2023    5:00 AM 12/30/2023    6:21 AM 12/29/2023    2:52 AM  Last 3 Weights  Weight (lbs) 208 lb 14.4 oz 213 lb 11.2 oz 220 lb 3.8 oz  Weight (kg) 94.756 kg 96.934 kg 99.9 kg     Telemetry/ECG  Sinus rhythm, HR 70s - Personally Reviewed  Physical Exam  GEN: No acute distress.   Neck: No JVD Cardiac: RRR, no murmurs, rubs, or gallops.  Respiratory: Clear to auscultation bilaterally. GI: Soft, nontender, non-distended  MS: No edema  Assessment & Plan   Pericardial effusion s/p pericardiocentesis Pericarditis Shortness of breath Underwent pericardiocentesis and drain placement 12/09/2023 Drain removed 9/8 Repeat echocardiogram 9/9 without reaccumulation Previously being treated with NSAIDs and colchicine  Cardiology signed off 12/17/2023 Hospitalist discontinued colchicine  9/15 due to N/V/D with improvement in symptoms  Cardiology asked to re-round on 9/25 due to recurrent shortness of breath Patient reports onset of shortness of breath started 9/24 PM Short of breath with mild activity as well as at rest CRP 23.1, ESR 55 on 9/5 Currently on ibuprofen  200 mg 3 times daily, recently decreased  Repeat TTE did not show re accumulation of effusion or evidence of constriction Rechecked CRP and ESR  show improvement Small to moderate bilateral pleural effusions, probably playing a role in her dyspnea   New onset A-fib/flutter with RVR New this admission Suspected from by her pericarditis Started on IV amiodarone , converted to NSR Not currently on DOAC due to initial presentation with pericarditis Plan to discuss DOAC if A-fib/flutter recurs Currently on amiodarone  200 mg daily Maintaining sinus rhythm  Per primary Hypothyroidism Hypertension Diabetes Electrolyte disturbances Hyperlipidemia Bipolar disorder Ileus Possible pneumonia Mechanical fall Anemia    We will sign off. Please contact us  with any concerns.  For questions or updates, please contact [facility] Please consult [email or web address] for contact info under     [name], MD

## 2023-12-31 NOTE — TOC Progression Note (Signed)
 Transition of Care [facility]) - Progression Note    Patient Details  Name: Brittany Acosta MRN: [id number] Date of Birth: 17-Oct-1950  Transition of Care [facility]) CM/SW Contact  Isaiah Public, LCSWA Phone Number: 12/31/2023, 11:58 AM  Clinical Narrative:     Patient has SNF bed at [facility] place when medically ready. Insurance authorization has been approved for SNF and PTAR. CSW will continue to follow.  Expected Discharge Plan: Skilled Nursing Facility Barriers to Discharge: Continued Medical Work up               Expected Discharge Plan and Services In-house Referral: Clinical Social Work Discharge Planning Services: CM Consult Post Acute Care Choice: Home Health Living arrangements for the past 2 months: Single Family Home                 DME Arranged: Walker rolling with seat DME Agency: [facility] Date DME Agency Contacted: 12/13/23 Time DME Agency Contacted: [phone] Representative spoke with at DME Agency: Ryan HH Arranged: PT, OT [facility] Agency: [facility] Date [facility] Agency Contacted: 12/16/23 Time HH Agency Contacted: 1600 Representative spoke with at [facility] Agency: Amy   Social Drivers of Health (SDOH) Interventions SDOH Screenings   Food Insecurity: No Food Insecurity (12/08/2023)  Housing: Low Risk  (12/08/2023)  Transportation Needs: No Transportation Needs (12/08/2023)  Utilities: Not At Risk (12/08/2023)  Depression (PHQ2-9): Low Risk  (11/22/2023)  Financial Resource Strain: Low Risk  (09/05/2019)   Received from [facility]  Physical Activity: Unknown (09/05/2019)   Received from [facility]  Social Connections: Moderately Isolated (12/08/2023)  Stress: No Stress Concern Present (09/05/2019)   Received from [facility]  Tobacco Use: Medium Risk (12/09/2023)    Readmission Risk Interventions     No data to display

## 2023-12-31 NOTE — Progress Notes (Signed)
 Mobility Specialist Progress Note:    12/31/23 1525  Mobility  Activity Respositioned in chair (Heel Raises, Leg Ext, Leg Curl, Leg lifts x10)  Level of Assistance Standby assist, set-up cues, supervision of patient - no hands on  Assistive Device None  Range of Motion/Exercises Left leg;Right leg  Activity Response Tolerated fair  Mobility Referral Yes  Mobility visit 1 Mobility  Mobility Specialist Start Time (ACUTE ONLY) 1525  Mobility Specialist Stop Time (ACUTE ONLY) 1558  Mobility Specialist Time Calculation (min) (ACUTE ONLY) 33 min   Received pt in recliner eating lunch and needing to get meds from RN. While waiting for RN, performed exercise w/ pt who seemed to do well. No c/o any symptoms and stated that she is starting to feel stronger. Left pt in recliner w/ all needs met.   Venetia Keel Mobility Specialist Please Neurosurgeon or Rehab Office at [phone]

## 2023-12-31 NOTE — Progress Notes (Signed)
 [facility] PROGRESS NOTE    Progress Note  Brittany Acosta  FMW:[id number] DOB: [birth year] DOA: 12/08/2023 PCP: [name], MD    Brief Narrative:   Brittany Acosta is an 73 y.o. female past medical history of mellitus type 2 insulin -dependent last hemoglobin A1c of 5.6, essential hypertension history of breast cancer started developing shortness of breath and chest pain about 3 days prior to admission, CT angio of the chest was concerning for a large pericardial effusion, she status post pericardiocentesis in the OR, developed A-fib with RVR on 12/13/2023 started on amiodarone  drip converted back to sinus rhythm.  Developed poor oral intake eventually developed hypoglycemia, colchicine  DC on 12/19/2023. Assessment/Plan:   Acute idiopathic pericarditis/pericardial effusion: Of unclear etiology, she was started empirically on colchicine  and ibuprofen . She has completed her treatment of colchicine  on 12/19/2023 due to profound diarrhea. Repeated 2D echo showed resolution of pericardial effusion drain removed by cardiology on 12/12/2023.  Ibuprofen  with dose reduced to 400 mg 3 times daily.  A-fib with RVR: Started on amiodarone  drip converted back to sinus rhythm. She will continue amiodarone  orally as an outpatient.  Was not started on anticoagulation as this was her first episode and is thought to be secondary to her recent pericardiocentesis.  Nausea vomiting and diarrhea: Now resolved question due to colchicine .  Hypokalemia: Replete recheck in the morning.  Hypomagnesemia: Replete recheck in the morning  Hypervolemic hyponatremia: Resolved with IV diuresis.  Hypothyroidism: Continue Synthroid .  Diabetes mellitus type 2: Continue sliding scale insulin , her A1c is 5.6.  Hyperlipidemia: Continue statins.  Bipolar disorder: Continue Zoloft .  Normocytic anemia: Hemoglobin appears to be stable continue to monitor intermittently.  Vitamin B12 deficiency/iron deficiency  anemia: Continue B12 replacement.  Ileus: Oxybutynin  has been stopped this has resolved, advance to a soft diet.  Possible pneumonia: Completed course of antibiotics in house.  Mechanical fall overnight: She relates she lightly hit her head. Currently no head pain. Hip x-ray is negative.  Essential hypertension: Continue Norvasc  and lisinopril .  Blood pressure is improved.  Dyspnea due to multifocal pneumonia on: In sinus rhythm rate controlled.  Cardiology was reconsulted. ESR 30 and CRP 0.5 CT of the chest showed small right and moderate left pleural effusion with basilar consolidation progress since prior study.  With decreasing pericardial effusion. Cardiology was consulted 2D echo was done that showed an EF of 60%, no evidence of pericardial effusion. Continue IV cefepime.  Has remained greater than 95% on room air Has remained febrile, CBC is pending this morning.  DVT prophylaxis: lovenox  Family Communication:none Status is: Inpatient Remains inpatient appropriate because: Acute idiopathic pericarditis    Code Status:     Code Status Orders  (From admission, onward)           Start     Ordered   12/08/23 2335  Full code  Continuous       Question:  By:  Answer:  Consent: discussion documented in EHR   12/08/23 2335           Code Status History     Date Active Date Inactive Code Status Order ID Comments User Context   07/01/2016 1358 07/02/2016 0019 Full Code [id number]  Louis Shove, MD Inpatient   02/07/2012 1400 02/10/2012 2005 Full Code [id number]  Cloria Rojelio Gunner, RN Inpatient   06/18/2011 1657 06/21/2011 2010 Full Code [id number]  Stephens Landry LABOR, RN Inpatient         IV Access:   Peripheral IV  Procedures and diagnostic studies:   ECHOCARDIOGRAM LIMITED Result Date: 12/30/2023    ECHOCARDIOGRAM LIMITED REPORT   Patient Name:   Brittany Acosta Date of Exam: 12/30/2023 Medical Rec #:  [id number]     Height:       61.5 in Accession #:     [id number]    Weight:       213.7 lb Date of Birth:  06/02/1950      BSA:          1.955 m Patient Age:    73 years      BP:           131/69 mmHg Patient Gender: F             HR:           68 bpm. Exam Location:  Inpatient Procedure: Limited Echo, Limited Color Doppler and Cardiac Doppler (Both            Spectral and Color Flow Doppler were utilized during procedure). Indications:     R/O Pericardial effusion  History:         Patient has prior history of Echocardiogram examinations.                  Signs/Symptoms:Dyspnea.  Sonographer:     Norleen Amour Referring Phys:  [id number] WADDELL A PARCELLS Diagnosing Phys: Oneil Parchment MD IMPRESSIONS  1. Left ventricular ejection fraction, by estimation, is 60 to 65%. The left ventricle has normal function.  2. Right ventricular systolic function is normal. There is normal pulmonary artery systolic pressure.  3. The mitral valve is normal in structure. Mild mitral valve regurgitation.  4. Tricuspid valve regurgitation is mild to moderate.  5. The aortic valve is tricuspid. Aortic valve regurgitation is not visualized. No aortic stenosis is present.  6. The inferior vena cava is normal in size with greater than 50% respiratory variability, suggesting right atrial pressure of 3 mmHg. FINDINGS  Left Ventricle: Left ventricular ejection fraction, by estimation, is 60 to 65%. The left ventricle has normal function. There is no left ventricular hypertrophy. Right Ventricle: Right ventricular systolic function is normal. There is normal pulmonary artery systolic pressure. The tricuspid regurgitant velocity is 2.70 m/s, and with an assumed right atrial pressure of 3 mmHg, the estimated right ventricular systolic pressure is 32.2 mmHg. Left Atrium: Left atrial size was normal in size. Right Atrium: Right atrial size was normal in size. Pericardium: There is no evidence of pericardial effusion. Mitral Valve: The mitral valve is normal in structure. Mild mitral valve regurgitation.  Tricuspid Valve: The tricuspid valve is normal in structure. Tricuspid valve regurgitation is mild to moderate. Aortic Valve: The aortic valve is tricuspid. Aortic valve regurgitation is not visualized. No aortic stenosis is present. Aorta: The aortic root is normal in size and structure. Venous: The inferior vena cava is normal in size with greater than 50% respiratory variability, suggesting right atrial pressure of 3 mmHg. LEFT VENTRICLE PLAX 2D LVIDd:         4.60 cm     Diastology LVIDs:         2.90 cm     LV e' medial:    6.96 cm/s LV PW:         0.80 cm     LV E/e' medial:  18.7 LV IVS:        1.10 cm     LV e' lateral:   12.10 cm/s  LV E/e' lateral: 10.7  LV Volumes (MOD) LV vol d, MOD A2C: 60.3 ml LV vol d, MOD A4C: 82.9 ml LV vol s, MOD A2C: 20.7 ml LV vol s, MOD A4C: 16.7 ml LV SV MOD A2C:     39.6 ml LV SV MOD A4C:     82.9 ml LV SV MOD BP:      52.2 ml RIGHT VENTRICLE             IVC RV S prime:     11.70 cm/s  IVC diam: 1.30 cm TAPSE (M-mode): 2.0 cm LEFT ATRIUM         Index LA diam:    3.70 cm 1.89 cm/m  PULMONIC VALVE PV Vmax:       1.51 m/s PV Peak grad:  9.1 mmHg  MITRAL VALVE                TRICUSPID VALVE MV Area (PHT): 2.50 cm     TR Peak grad:   29.2 mmHg MV Decel Time: 304 msec     TR Vmax:        270.00 cm/s MV E velocity: 130.00 cm/s MV A velocity: 88.10 cm/s MV E/A ratio:  1.48 Oneil Parchment MD Electronically signed by Oneil Parchment MD Signature Date/Time: 12/30/2023/3:56:54 PM    Final (Updated)    CT CHEST WO CONTRAST Result Date: 12/29/2023 CLINICAL DATA:  Chronic dyspnea of unclear etiology.  Chest pain. EXAM: CT CHEST WITHOUT CONTRAST TECHNIQUE: Multidetector CT imaging of the chest was performed following the standard protocol without IV contrast. RADIATION DOSE REDUCTION: This exam was performed according to the departmental dose-optimization program which includes automated exposure control, adjustment of the mA and/or kV according to patient size and/or  use of iterative reconstruction technique. COMPARISON:  Chest radiograph December 29, 2023. CT chest December 08, 2023. FINDINGS: Cardiovascular: Normal heart size. Small pericardial effusion. Normal caliber thoracic aorta. No significant vascular calcification. Mediastinum/Nodes: Esophagus is decompressed. Scattered lymph nodes are not pathologically enlarged. Thyroid  gland is unremarkable. Lungs/Pleura: Small right and moderate left pleural effusions. Basilar atelectasis or consolidation bilaterally. This could represent compressive atelectasis or pneumonia. These changes have progressed since the prior study. Several scattered pulmonary nodules are demonstrated. Largest is in the left upper lung peripherally measuring four mm diameter. Nodules are unchanged since prior study. No imaging followup is indicated based on size criteria. No pneumothorax. Upper Abdomen: Surgical absence of the gallbladder. No acute abnormalities. Musculoskeletal: Degenerative changes in the spine. Postoperative change in the right shoulder. Left shoulder is incompletely visualized but demonstrates cystic changes, likely representing degenerative cyst. No change since prior study. No acute bony abnormalities. IMPRESSION: Small right and moderate left pleural effusions with basilar consolidation or atelectasis. This is progressed since prior study. Small pericardial effusion is decreased. Otherwise, no change. Electronically Signed   By: Elsie Gravely M.D.   On: 12/29/2023 19:42   DG CHEST PORT 1 VIEW Result Date: 12/29/2023 CLINICAL DATA:  Pericardial effusion, chest pain, and dyspnea EXAM: PORTABLE CHEST 1 VIEW COMPARISON:  Chest radiograph dated 12/24/2023 FINDINGS: Normal lung volumes. Persistent dense left retrocardiac opacity. Trace blunting of left costophrenic angle. No pneumothorax. Similar enlarged cardiomediastinal silhouette. Right shoulder arthroplasty. IMPRESSION: 1. Persistent dense left retrocardiac opacity, which  may represent atelectasis, aspiration, or pneumonia. 2. Trace left pleural effusion. 3. Similar enlarged cardiomediastinal silhouette. Electronically Signed   By: Limin  Xu M.D.   On: 12/29/2023 14:23     Medical Consultants:   None.  Subjective:    Brittany Acosta relates her breathing is significantly better today.  Objective:    Vitals:   12/30/23 2319 12/31/23 0423 12/31/23 0500 12/31/23 0756  BP: (!) 169/61 (!) 158/52    Pulse: 75 (!) 58  69  Resp: 16 18  20   Temp: 98 F (36.7 C) (!) 97.3 F (36.3 C)  98.4 F (36.9 C)  TempSrc: Oral Oral  Oral  SpO2: 98% 95%    Weight:   94.8 kg   Height:       SpO2: 95 % O2 Flow Rate (L/min): 2 L/min FiO2 (%): 28 %   Intake/Output Summary (Last 24 hours) at 12/31/2023 0833 Last data filed at 12/31/2023 0759 Gross per 24 hour  Intake 327.99 ml  Output 500 ml  Net -172.01 ml   Filed Weights   12/29/23 0252 12/30/23 0621 12/31/23 0500  Weight: 99.9 kg 96.9 kg 94.8 kg    Exam: General exam: In no acute distress. Respiratory system: Good air movement and clear to auscultation. Cardiovascular system: S1 & S2 heard, RRR. No JVD. Gastrointestinal system: Abdomen is nondistended, soft and nontender.  Extremities: No pedal edema. Skin: No rashes, lesions or ulcers Psychiatry: Judgement and insight appear normal. Mood & affect appropriate.  Data Reviewed:    Labs: Basic Metabolic Panel: Recent Labs  Lab 12/25/23 0324 12/26/23 0953 12/27/23 0446 12/27/23 1330 12/28/23 0544 12/28/23 0854 12/30/23 0827 12/31/23 0430  NA 128* 136 137 135 134*  --   --  136  K 3.6 3.3* 2.6* 4.0 4.5  --   --  3.4*  CL 92* 99 101 97* 100  --   --  95*  CO2 21* 21* 25 25 24   --   --  29  GLUCOSE 123* 148* 107* 110* 139*  --   --  125*  BUN 17 9 5* <5* <5*  --   --  <5*  CREATININE 1.02* 0.77 0.63 0.55 0.59  --   --  0.66  CALCIUM  8.7* 8.9 8.3* 8.8* 8.9  --   --  9.0  MG  --   --  1.3*  --   --  1.8 1.4*  --   PHOS 4.0  --   --   --    --   --   --   --    GFR Estimated Creatinine Clearance: 66.5 mL/min (by C-G formula based on SCr of 0.66 mg/dL). Liver Function Tests: Recent Labs  Lab 12/25/23 0324 12/26/23 0953 12/27/23 0446 12/28/23 0544  AST  --  21 23 23   ALT  --  16 16 16   ALKPHOS  --  98 93 103  BILITOT  --  0.6 0.3 0.4  PROT  --  5.7* 5.5* 5.8*  ALBUMIN  2.6* 2.5* 2.3* 2.6*   No results for input(s): LIPASE, AMYLASE in the last 168 hours. No results for input(s): AMMONIA in the last 168 hours. Coagulation profile No results for input(s): INR, PROTIME in the last 168 hours. COVID-19 Labs  Recent Labs    12/29/23 1634  CRP 1.5*    Lab Results  Component Value Date   SARSCOV2NAA NEGATIVE 12/08/2023   SARSCOV2NAA Negative 06/16/2021   SARSCOV2NAA RESULT: NEGATIVE 02/10/2021   SARSCOV2NAA Detected (A) 04/24/2020    CBC: Recent Labs  Lab 12/26/23 0953 12/27/23 0446 12/28/23 0544 12/29/23 1848 12/30/23 0701  WBC 10.8* 9.6 10.6* 12.4* 11.5*  NEUTROABS  --   --   --  7.2 7.1  HGB 8.6* 8.8* 8.5* 9.4* 10.3*  HCT 26.0* 26.9* 26.3* 28.7* 31.8*  MCV 82.8 83.3 83.2 82.2 83.7  PLT 778* 813* 768* 794* 749*   Cardiac Enzymes: No results for input(s): CKTOTAL, CKMB, CKMBINDEX, TROPONINI in the last 168 hours. BNP (last 3 results) Recent Labs    12/08/23 1700  PROBNP 725.0*   CBG: Recent Labs  Lab 12/30/23 0750 12/30/23 1215 12/30/23 1626 12/30/23 2100 12/31/23 0754  GLUCAP 131* 116* 147* 134* 122*   D-Dimer: No results for input(s): DDIMER in the last 72 hours. Hgb A1c: No results for input(s): HGBA1C in the last 72 hours. Lipid Profile: No results for input(s): CHOL, HDL, LDLCALC, TRIG, CHOLHDL, LDLDIRECT in the last 72 hours. Thyroid  function studies: No results for input(s): TSH, T4TOTAL, T3FREE, THYROIDAB in the last 72 hours.  Invalid input(s): FREET3 Anemia work up: No results for input(s): VITAMINB12, FOLATE, FERRITIN,  TIBC, IRON, RETICCTPCT in the last 72 hours. Sepsis Labs: Recent Labs  Lab 12/27/23 0446 12/28/23 0544 12/29/23 1848 12/30/23 0701  WBC 9.6 10.6* 12.4* 11.5*   Microbiology Recent Results (from the past 240 hours)  MRSA Next Gen by PCR, Nasal     Status: None   Collection Time: 12/30/23 10:02 AM   Specimen: Nasal Mucosa; Nasal Swab  Result Value Ref Range Status   MRSA by PCR Next Gen NOT DETECTED NOT DETECTED Final    Comment: (NOTE) The GeneXpert MRSA Assay (FDA approved for NASAL specimens only), is one component of a comprehensive MRSA colonization surveillance program. It is not intended to diagnose MRSA infection nor to guide or monitor treatment for MRSA infections. Test performance is not FDA approved in patients less than 41 years old. Performed at [facility], [address] [place], [place] [address]       Medications:    allopurinol   300 mg Oral Daily   amiodarone   200 mg Oral Daily   amLODipine   10 mg Oral Daily   benzonatate   200 mg Oral TID   chlorpheniramine-HYDROcodone   5 mL Oral Q12H   docusate sodium   100 mg Oral BID   ferrous sulfate   325 mg Oral Q breakfast   hydrALAZINE   25 mg Oral Q8H   ibuprofen   200 mg Oral TID   insulin  aspart  0-15 Units Subcutaneous TID WC   lamoTRIgine   200 mg Oral Q1200   levothyroxine   50 mcg Oral Daily   lisinopril   10 mg Oral Daily   loratadine   10 mg Oral Daily   magnesium  oxide  400 mg Oral BID   metoprolol  tartrate  25 mg Oral BID   pantoprazole   40 mg Oral BID   polyethylene glycol  17 g Oral BID   potassium chloride   40 mEq Oral BID   pravastatin   20 mg Oral QHS   QUEtiapine   25 mg Oral QHS   sodium chloride  flush  10-40 mL Intracatheter Q12H   sodium chloride  flush  3 mL Intravenous Q12H   vitamin B-12  500 mcg Oral Daily   Continuous Infusions:      LOS: 23 days   Brittany Acosta  [facility]  12/31/2023, 8:33 AM

## 2024-01-01 DIAGNOSIS — I3139 Other pericardial effusion (noninflammatory): Secondary | ICD-10-CM | POA: Diagnosis not present

## 2024-01-01 LAB — CBC
HCT: 31.1 % — ABNORMAL LOW (ref 36.0–46.0)
Hemoglobin: 9.7 g/dL — ABNORMAL LOW (ref 12.0–15.0)
MCH: 26.6 pg (ref 26.0–34.0)
MCHC: 31.2 g/dL (ref 30.0–36.0)
MCV: 85.2 fL (ref 80.0–100.0)
Platelets: 554 K/uL — ABNORMAL HIGH (ref 150–400)
RBC: 3.65 MIL/uL — ABNORMAL LOW (ref 3.87–5.11)
RDW: 16.5 % — ABNORMAL HIGH (ref 11.5–15.5)
WBC: 14.7 K/uL — ABNORMAL HIGH (ref 4.0–10.5)
nRBC: 0 % (ref 0.0–0.2)

## 2024-01-01 LAB — BASIC METABOLIC PANEL WITH GFR
Anion gap: 9 (ref 5–15)
BUN: 5 mg/dL — ABNORMAL LOW (ref 8–23)
CO2: 27 mmol/L (ref 22–32)
Calcium: 8.8 mg/dL — ABNORMAL LOW (ref 8.9–10.3)
Chloride: 99 mmol/L (ref 98–111)
Creatinine, Ser: 0.8 mg/dL (ref 0.44–1.00)
GFR, Estimated: >60 mL/min (ref >60)
Glucose, Bld: 132 mg/dL — ABNORMAL HIGH (ref 70–99)
Potassium: 4.2 mmol/L (ref 3.5–5.1)
Sodium: 135 mmol/L (ref 135–145)

## 2024-01-01 LAB — GLUCOSE, CAPILLARY
Glucose-Capillary: 126 mg/dL — ABNORMAL HIGH (ref 70–99)
Glucose-Capillary: 137 mg/dL — ABNORMAL HIGH (ref 70–99)
Glucose-Capillary: 115 mg/dL — ABNORMAL HIGH (ref 70–99)
Glucose-Capillary: 128 mg/dL — ABNORMAL HIGH (ref 70–99)

## 2024-01-01 LAB — MAGNESIUM: Magnesium: 1.7 mg/dL (ref 1.7–2.4)

## 2024-01-01 MED ORDER — DOXYCYCLINE HYCLATE 100 MG PO TABS
100.0000 mg | ORAL_TABLET | Freq: Two times a day (BID) | ORAL | Status: DC
  Administered 2024-01-01 – 2024-01-02 (×3): 100 mg via ORAL
  Filled 2024-01-01 (×3): qty 1

## 2024-01-01 MED ORDER — AMOXICILLIN-POT CLAVULANATE 875-125 MG PO TABS
1.0000 | ORAL_TABLET | Freq: Two times a day (BID) | ORAL | Status: DC
  Administered 2024-01-01 – 2024-01-02 (×3): 1 via ORAL
  Filled 2024-01-01 (×3): qty 1

## 2024-01-01 NOTE — Progress Notes (Signed)
 [facility] PROGRESS NOTE    Progress Note  Brittany Acosta  FMW:[id number] DOB: 1950-10-04 DOA: 12/08/2023 PCP: [name], MD    Brief Narrative:   Brittany Acosta is an 73 y.o. female past medical history of mellitus type 2 insulin -dependent last hemoglobin A1c of 5.6, essential hypertension history of breast cancer started developing shortness of breath and chest pain about 3 days prior to admission, CT angio of the chest was concerning for a large pericardial effusion, she status post pericardiocentesis in the OR, developed A-fib with RVR on 12/13/2023 started on amiodarone  drip converted back to sinus rhythm.  Developed poor oral intake eventually developed hypoglycemia, colchicine  DC on 12/19/2023. Assessment/Plan:   Acute idiopathic pericarditis/pericardial effusion: Of unclear etiology, she was started empirically on colchicine  and ibuprofen . She has completed her treatment of colchicine  on 12/19/2023 due to profound diarrhea. Repeated 2D echo showed resolution of pericardial effusion drain removed by cardiology on 12/12/2023.  Ibuprofen  with dose reduced to 400 mg 3 times daily.  A-fib with RVR: Started on amiodarone  drip converted back to sinus rhythm. She will continue amiodarone  orally as an outpatient.  Was not started on anticoagulation as this was her first episode and is thought to be secondary to her recent pericardiocentesis.  Nausea vomiting and diarrhea: Now resolved question due to colchicine .  Hypokalemia: Replete recheck in the morning.  Hypomagnesemia: Replete recheck in the morning  Hypervolemic hyponatremia: Resolved with IV diuresis.  Hypothyroidism: Continue Synthroid .  Diabetes mellitus type 2: Continue sliding scale insulin , her A1c is 5.6.  Hyperlipidemia: Continue statins.  Bipolar disorder: Continue Zoloft .  Normocytic anemia: Hemoglobin appears to be stable continue to monitor intermittently.  Vitamin B12 deficiency/iron deficiency  anemia: Continue B12 replacement.  Ileus: Oxybutynin  has been stopped this has resolved, advance to a soft diet.  Mechanical fall overnight: She relates she lightly hit her head. Currently no head pain. Hip x-ray is negative.  Essential hypertension: Continue Norvasc  and lisinopril .  Blood pressure is improved.  New onset multifocal pneumonia: CT of the chest showed small right and moderate left pleural effusion with basilar consolidation progress since prior study.  With decreasing pericardial effusion. Cardiology was consulted 2D echo was done that showed an EF of 60%, no evidence of pericardial effusion. She was started empirically on IV cefepime, her leukocytosis improved she relates her dyspnea has improved. She has remained afebrile. Hopefully can be discharged tomorrow to skilled nursing facility.  DVT prophylaxis: lovenox  Family Communication:none Status is: Inpatient Remains inpatient appropriate because: Acute idiopathic pericarditis    Code Status:     Code Status Orders  (From admission, onward)           Start     Ordered   12/08/23 2335  Full code  Continuous       Question:  By:  Answer:  Consent: discussion documented in EHR   12/08/23 2335           Code Status History     Date Active Date Inactive Code Status Order ID Comments User Context   07/01/2016 1358 07/02/2016 0019 Full Code [id number]  Louis Shove, MD Inpatient   02/07/2012 1400 02/10/2012 2005 Full Code [id number]  Cloria Rojelio Gunner, RN Inpatient   06/18/2011 1657 06/21/2011 2010 Full Code [id number]  Stephens Landry LABOR, RN Inpatient         IV Access:   Peripheral IV   Procedures and diagnostic studies:   ECHOCARDIOGRAM LIMITED Result Date: 12/30/2023    ECHOCARDIOGRAM LIMITED  REPORT   Patient Name:   Brittany Acosta Date of Exam: 12/30/2023 Medical Rec #:  [id number]     Height:       61.5 in Accession #:    [id number]    Weight:       213.7 lb Date of Birth:  10-23-1950      BSA:           1.955 m Patient Age:    73 years      BP:           131/69 mmHg Patient Gender: F             HR:           68 bpm. Exam Location:  Inpatient Procedure: Limited Echo, Limited Color Doppler and Cardiac Doppler (Both            Spectral and Color Flow Doppler were utilized during procedure). Indications:     R/O Pericardial effusion  History:         Patient has prior history of Echocardiogram examinations.                  Signs/Symptoms:Dyspnea.  Sonographer:     Norleen Amour Referring Phys:  [id number] WADDELL A PARCELLS Diagnosing Phys: Oneil Parchment MD IMPRESSIONS  1. Left ventricular ejection fraction, by estimation, is 60 to 65%. The left ventricle has normal function.  2. Right ventricular systolic function is normal. There is normal pulmonary artery systolic pressure.  3. The mitral valve is normal in structure. Mild mitral valve regurgitation.  4. Tricuspid valve regurgitation is mild to moderate.  5. The aortic valve is tricuspid. Aortic valve regurgitation is not visualized. No aortic stenosis is present.  6. The inferior vena cava is normal in size with greater than 50% respiratory variability, suggesting right atrial pressure of 3 mmHg. FINDINGS  Left Ventricle: Left ventricular ejection fraction, by estimation, is 60 to 65%. The left ventricle has normal function. There is no left ventricular hypertrophy. Right Ventricle: Right ventricular systolic function is normal. There is normal pulmonary artery systolic pressure. The tricuspid regurgitant velocity is 2.70 m/s, and with an assumed right atrial pressure of 3 mmHg, the estimated right ventricular systolic pressure is 32.2 mmHg. Left Atrium: Left atrial size was normal in size. Right Atrium: Right atrial size was normal in size. Pericardium: There is no evidence of pericardial effusion. Mitral Valve: The mitral valve is normal in structure. Mild mitral valve regurgitation. Tricuspid Valve: The tricuspid valve is normal in structure. Tricuspid valve  regurgitation is mild to moderate. Aortic Valve: The aortic valve is tricuspid. Aortic valve regurgitation is not visualized. No aortic stenosis is present. Aorta: The aortic root is normal in size and structure. Venous: The inferior vena cava is normal in size with greater than 50% respiratory variability, suggesting right atrial pressure of 3 mmHg. LEFT VENTRICLE PLAX 2D LVIDd:         4.60 cm     Diastology LVIDs:         2.90 cm     LV e' medial:    6.96 cm/s LV PW:         0.80 cm     LV E/e' medial:  18.7 LV IVS:        1.10 cm     LV e' lateral:   12.10 cm/s  LV E/e' lateral: 10.7  LV Volumes (MOD) LV vol d, MOD A2C: 60.3 ml LV vol d, MOD A4C: 82.9 ml LV vol s, MOD A2C: 20.7 ml LV vol s, MOD A4C: 16.7 ml LV SV MOD A2C:     39.6 ml LV SV MOD A4C:     82.9 ml LV SV MOD BP:      52.2 ml RIGHT VENTRICLE             IVC RV S prime:     11.70 cm/s  IVC diam: 1.30 cm TAPSE (M-mode): 2.0 cm LEFT ATRIUM         Index LA diam:    3.70 cm 1.89 cm/m  PULMONIC VALVE PV Vmax:       1.51 m/s PV Peak grad:  9.1 mmHg  MITRAL VALVE                TRICUSPID VALVE MV Area (PHT): 2.50 cm     TR Peak grad:   29.2 mmHg MV Decel Time: 304 msec     TR Vmax:        270.00 cm/s MV E velocity: 130.00 cm/s MV A velocity: 88.10 cm/s MV E/A ratio:  1.48 Oneil Parchment MD Electronically signed by Oneil Parchment MD Signature Date/Time: 12/30/2023/3:56:54 PM    Final (Updated)      Medical Consultants:   None.   Subjective:    Brittany Acosta feels significantly better this morning.  Objective:    Vitals:   12/31/23 2056 01/01/24 0051 01/01/24 0415 01/01/24 0422  BP: 99/82 (!) 152/62  (!) 171/64  Pulse: 72 67    Resp: 18 18 18    Temp: 98.8 F (37.1 C) 97.6 F (36.4 C) 97.9 F (36.6 C)   TempSrc: Oral Oral Oral   SpO2: 96% 92% 99%   Weight:      Height:       SpO2: 99 % O2 Flow Rate (L/min): 2 L/min FiO2 (%): 28 %   Intake/Output Summary (Last 24 hours) at 01/01/2024 0700 Last data filed  at 12/31/2023 1200 Gross per 24 hour  Intake 1213 ml  Output --  Net 1213 ml   Filed Weights   12/29/23 0252 12/30/23 0621 12/31/23 0500  Weight: 99.9 kg 96.9 kg 94.8 kg    Exam: General exam: In no acute distress. Respiratory system: Good air movement and clear to auscultation. Cardiovascular system: S1 & S2 heard, RRR. No JVD. Gastrointestinal system: Abdomen is nondistended, soft and nontender.  Extremities: No pedal edema. Skin: No rashes, lesions or ulcers Psychiatry: Judgement and insight appear normal. Mood & affect appropriate.  Data Reviewed:    Labs: Basic Metabolic Panel: Recent Labs  Lab 12/27/23 0446 12/27/23 1330 12/28/23 0544 12/28/23 0854 12/30/23 0827 12/31/23 0430 01/01/24 0330  NA 137 135 134*  --   --  136 135  K 2.6* 4.0 4.5  --   --  3.4* 4.2  CL 101 97* 100  --   --  95* 99  CO2 25 25 24   --   --  29 27  GLUCOSE 107* 110* 139*  --   --  125* 132*  BUN 5* <5* <5*  --   --  <5* 5*  CREATININE 0.63 0.55 0.59  --   --  0.66 0.80  CALCIUM  8.3* 8.8* 8.9  --   --  9.0 8.8*  MG 1.3*  --   --  1.8 1.4*  --  1.7  GFR Estimated Creatinine Clearance: 66.5 mL/min (by C-G formula based on SCr of 0.8 mg/dL). Liver Function Tests: Recent Labs  Lab 12/26/23 0953 12/27/23 0446 12/28/23 0544  AST 21 23 23   ALT 16 16 16   ALKPHOS 98 93 103  BILITOT 0.6 0.3 0.4  PROT 5.7* 5.5* 5.8*  ALBUMIN  2.5* 2.3* 2.6*   No results for input(s): LIPASE, AMYLASE in the last 168 hours. No results for input(s): AMMONIA in the last 168 hours. Coagulation profile No results for input(s): INR, PROTIME in the last 168 hours. COVID-19 Labs  Recent Labs    12/29/23 1634  CRP 1.5*    Lab Results  Component Value Date   SARSCOV2NAA NEGATIVE 12/08/2023   SARSCOV2NAA Negative 06/16/2021   SARSCOV2NAA RESULT: NEGATIVE 02/10/2021   SARSCOV2NAA Detected (A) 04/24/2020    CBC: Recent Labs  Lab 12/27/23 0446 12/28/23 0544 12/29/23 1848 12/30/23 0701  12/31/23 0953  WBC 9.6 10.6* 12.4* 11.5* 9.1  NEUTROABS  --   --  7.2 7.1  --   HGB 8.8* 8.5* 9.4* 10.3* 9.5*  HCT 26.9* 26.3* 28.7* 31.8* 30.3*  MCV 83.3 83.2 82.2 83.7 84.9  PLT 813* 768* 794* 749* 450*   Cardiac Enzymes: No results for input(s): CKTOTAL, CKMB, CKMBINDEX, TROPONINI in the last 168 hours. BNP (last 3 results) Recent Labs    12/08/23 1700  PROBNP 725.0*   CBG: Recent Labs  Lab 12/30/23 2100 12/31/23 0754 12/31/23 1234 12/31/23 1645 12/31/23 2051  GLUCAP 134* 122* 96 160* 126*   D-Dimer: No results for input(s): DDIMER in the last 72 hours. Hgb A1c: No results for input(s): HGBA1C in the last 72 hours. Lipid Profile: No results for input(s): CHOL, HDL, LDLCALC, TRIG, CHOLHDL, LDLDIRECT in the last 72 hours. Thyroid  function studies: No results for input(s): TSH, T4TOTAL, T3FREE, THYROIDAB in the last 72 hours.  Invalid input(s): FREET3 Anemia work up: No results for input(s): VITAMINB12, FOLATE, FERRITIN, TIBC, IRON, RETICCTPCT in the last 72 hours. Sepsis Labs: Recent Labs  Lab 12/28/23 0544 12/29/23 1848 12/30/23 0701 12/31/23 0953  WBC 10.6* 12.4* 11.5* 9.1   Microbiology Recent Results (from the past 240 hours)  MRSA Next Gen by PCR, Nasal     Status: None   Collection Time: 12/30/23 10:02 AM   Specimen: Nasal Mucosa; Nasal Swab  Result Value Ref Range Status   MRSA by PCR Next Gen NOT DETECTED NOT DETECTED Final    Comment: (NOTE) The GeneXpert MRSA Assay (FDA approved for NASAL specimens only), is one component of a comprehensive MRSA colonization surveillance program. It is not intended to diagnose MRSA infection nor to guide or monitor treatment for MRSA infections. Test performance is not FDA approved in patients less than 37 years old. Performed at [facility], [address] [place], [place] [address]       Medications:    allopurinol   300 mg Oral Daily   amiodarone    200 mg Oral Daily   amLODipine   10 mg Oral Daily   benzonatate   200 mg Oral TID   chlorpheniramine-HYDROcodone   5 mL Oral Q12H   docusate sodium   100 mg Oral BID   ferrous sulfate   325 mg Oral Q breakfast   hydrALAZINE   25 mg Oral Q8H   ibuprofen   200 mg Oral TID   insulin  aspart  0-15 Units Subcutaneous TID WC   lamoTRIgine   200 mg Oral Q1200   levothyroxine   50 mcg Oral Daily   lisinopril   10 mg Oral  Daily   loratadine   10 mg Oral Daily   metoprolol  tartrate  25 mg Oral BID   pantoprazole   40 mg Oral BID   pravastatin   20 mg Oral QHS   QUEtiapine   25 mg Oral QHS   sodium chloride  flush  10-40 mL Intracatheter Q12H   sodium chloride  flush  3 mL Intravenous Q12H   vitamin B-12  500 mcg Oral Daily   Continuous Infusions:      LOS: 24 days   Erle Odell Castor  [facility]  01/01/2024, 7:00 AM

## 2024-01-01 NOTE — Progress Notes (Signed)
 Mobility Specialist Progress Note:    01/01/24 1517  Mobility  Activity Ambulated with assistance  Level of Assistance Contact guard assist, steadying assist  Assistive Device Front wheel walker  Distance Ambulated (ft) 100 ft  Activity Response Tolerated fair  Mobility Referral Yes  Mobility visit 1 Mobility  Mobility Specialist Start Time (ACUTE ONLY) 1517  Mobility Specialist Stop Time (ACUTE ONLY) 1530  Mobility Specialist Time Calculation (min) (ACUTE ONLY) 13 min   Pt in recliner agreeable to session. No c/o any symptoms. Pt at times moving and ambulating well but throughout when rushing or moving too fast, had several bouts of unsteadiness on her feet. Constant cueing to slow down needed. Returned pt to recliner w/ all needs met.   Venetia Keel Mobility Specialist Please Neurosurgeon or Rehab Office at [phone]

## 2024-01-02 ENCOUNTER — Other Ambulatory Visit (HOSPITAL_COMMUNITY): Payer: Self-pay

## 2024-01-02 DIAGNOSIS — I300 Acute nonspecific idiopathic pericarditis: Secondary | ICD-10-CM | POA: Diagnosis not present

## 2024-01-02 DIAGNOSIS — N3281 Overactive bladder: Secondary | ICD-10-CM | POA: Diagnosis not present

## 2024-01-02 DIAGNOSIS — K529 Noninfective gastroenteritis and colitis, unspecified: Secondary | ICD-10-CM | POA: Diagnosis not present

## 2024-01-02 DIAGNOSIS — M069 Rheumatoid arthritis, unspecified: Secondary | ICD-10-CM | POA: Diagnosis not present

## 2024-01-02 DIAGNOSIS — I480 Paroxysmal atrial fibrillation: Secondary | ICD-10-CM | POA: Diagnosis not present

## 2024-01-02 DIAGNOSIS — M6281 Muscle weakness (generalized): Secondary | ICD-10-CM | POA: Diagnosis not present

## 2024-01-02 DIAGNOSIS — M109 Gout, unspecified: Secondary | ICD-10-CM | POA: Diagnosis not present

## 2024-01-02 DIAGNOSIS — M797 Fibromyalgia: Secondary | ICD-10-CM | POA: Diagnosis not present

## 2024-01-02 DIAGNOSIS — J189 Pneumonia, unspecified organism: Secondary | ICD-10-CM | POA: Diagnosis not present

## 2024-01-02 DIAGNOSIS — I3139 Other pericardial effusion (noninflammatory): Secondary | ICD-10-CM | POA: Diagnosis not present

## 2024-01-02 DIAGNOSIS — R2681 Unsteadiness on feet: Secondary | ICD-10-CM | POA: Diagnosis not present

## 2024-01-02 DIAGNOSIS — R531 Weakness: Secondary | ICD-10-CM | POA: Diagnosis not present

## 2024-01-02 DIAGNOSIS — E119 Type 2 diabetes mellitus without complications: Secondary | ICD-10-CM | POA: Diagnosis not present

## 2024-01-02 DIAGNOSIS — K219 Gastro-esophageal reflux disease without esophagitis: Secondary | ICD-10-CM | POA: Diagnosis not present

## 2024-01-02 DIAGNOSIS — C50412 Malignant neoplasm of upper-outer quadrant of left female breast: Secondary | ICD-10-CM | POA: Diagnosis not present

## 2024-01-02 DIAGNOSIS — I10 Essential (primary) hypertension: Secondary | ICD-10-CM | POA: Diagnosis not present

## 2024-01-02 DIAGNOSIS — J302 Other seasonal allergic rhinitis: Secondary | ICD-10-CM | POA: Diagnosis not present

## 2024-01-02 LAB — BASIC METABOLIC PANEL WITH GFR
Anion gap: 11 (ref 5–15)
BUN: 5 mg/dL — ABNORMAL LOW (ref 8–23)
CO2: 25 mmol/L (ref 22–32)
Calcium: 8.9 mg/dL (ref 8.9–10.3)
Chloride: 98 mmol/L (ref 98–111)
Creatinine, Ser: 0.7 mg/dL (ref 0.44–1.00)
GFR, Estimated: >60 mL/min (ref >60)
Glucose, Bld: 123 mg/dL — ABNORMAL HIGH (ref 70–99)
Potassium: 4.1 mmol/L (ref 3.5–5.1)
Sodium: 134 mmol/L — ABNORMAL LOW (ref 135–145)

## 2024-01-02 LAB — GLUCOSE, CAPILLARY: Glucose-Capillary: 105 mg/dL — ABNORMAL HIGH (ref 70–99)

## 2024-01-02 MED ORDER — DOXYCYCLINE HYCLATE 100 MG PO TABS: 100.0000 mg | ORAL_TABLET | Freq: Two times a day (BID) | ORAL | Status: AC

## 2024-01-02 MED ORDER — IBUPROFEN 200 MG PO TABS
200.0000 mg | ORAL_TABLET | Freq: Three times a day (TID) | ORAL | 0 refills | Status: AC
  Filled 2024-01-02: qty 6, 2d supply, fill #0

## 2024-01-02 MED ORDER — AMOXICILLIN-POT CLAVULANATE 875-125 MG PO TABS: 1.0000 | ORAL_TABLET | Freq: Two times a day (BID) | ORAL | Status: AC

## 2024-01-02 MED ORDER — AMLODIPINE BESYLATE 10 MG PO TABS: 10.0000 mg | ORAL_TABLET | Freq: Every day | ORAL | Status: DC

## 2024-01-02 MED ORDER — AMIODARONE HCL 200 MG PO TABS: 200.0000 mg | ORAL_TABLET | Freq: Every day | ORAL | Status: DC

## 2024-01-02 MED ORDER — HYDRALAZINE HCL 25 MG PO TABS: 25.0000 mg | ORAL_TABLET | Freq: Three times a day (TID) | ORAL | Status: DC

## 2024-01-02 NOTE — TOC Progression Note (Signed)
 Transition of Care [facility]) - Progression Note    Patient Details  Name: Brittany Acosta MRN: [id number] Date of Birth: 1951/03/30  Transition of Care [facility]) CM/SW Contact  Isaiah Public, LCSWA Phone Number: 01/02/2024, 10:10 AM  Clinical Narrative:     [facility] with [facility] confirmed facility can accept patient today.   Expected Discharge Plan: Skilled Nursing Facility Barriers to Discharge: Continued Medical Work up               Expected Discharge Plan and Services In-house Referral: Clinical Social Work Discharge Planning Services: CM Consult Post Acute Care Choice: Home Health Living arrangements for the past 2 months: Single Family Home Expected Discharge Date: 01/02/24               DME Arranged: Vannie rolling with seat DME Agency: [facility] Date DME Agency Contacted: 12/13/23 Time DME Agency Contacted: [phone] Representative spoke with at DME Agency: Ryan [facility] Arranged: PT, OT [facility] Agency: [facility] Date [facility] Agency Contacted: 12/16/23 Time HH Agency Contacted: 1600 Representative spoke with at [facility] Agency: Amy   Social Drivers of Health (SDOH) Interventions SDOH Screenings   Food Insecurity: No Food Insecurity (12/08/2023)  Housing: Low Risk  (12/08/2023)  Transportation Needs: No Transportation Needs (12/08/2023)  Utilities: Not At Risk (12/08/2023)  Depression (PHQ2-9): Low Risk  (11/22/2023)  Financial Resource Strain: Low Risk  (09/05/2019)   Received from [facility]  Physical Activity: Unknown (09/05/2019)   Received from [facility]  Social Connections: Moderately Isolated (12/08/2023)  Stress: No Stress Concern Present (09/05/2019)   Received from [facility]  Tobacco Use: Medium Risk (12/09/2023)    Readmission Risk Interventions     No data to display

## 2024-01-02 NOTE — TOC Transition Note (Signed)
 Transition of Care [facility]) - Discharge Note   Patient Details  Name: Brittany Acosta MRN: [id number] Date of Birth: 12-13-1950  [facility]) CM/SW Contact:  Isaiah Public, LCSWA Phone Number: 01/02/2024, 10:36 AM   Clinical Narrative:     [facility]   Anticipated DC date: 01/02/2024  Family notified: Laurier  Transport by: [facility]  ?  Per MD patient ready for DC to [facility] . RN, patient, patient's family, and facility notified of DC. Discharge Summary sent to facility. RN given number for report [phone] RM# [phone]. DC packet on chart. Ambulance transport requested for patient.  CSW signing off.   Final next level of care: Skilled Nursing Facility Barriers to Discharge: Continued Medical Work up   Patient Goals and CMS Choice Patient states their goals for this hospitalization and ongoing recovery are:: SNF CMS Medicare.gov Compare Post Acute Care list provided to:: Patient Choice offered to / list presented to : Patient, Spouse      Discharge Placement              Patient chooses bed at: [facility] Patient to be transferred to facility by: [facility] Name of family member notified: Laurier Patient and family notified of of transfer: 01/02/24  Discharge Plan and Services Additional resources added to the After Visit Summary for   In-house Referral: Clinical Social Work Discharge Planning Services: CM Consult Post Acute Care Choice: Home Health          DME Arranged: Vannie rolling with seat DME Agency: [facility] Date DME Agency Contacted: 12/13/23 Time DME Agency Contacted: [phone] Representative spoke with at DME Agency: Ryan [facility] Arranged: PT, OT [facility] Agency: [facility] Date [facility] Agency Contacted: 12/16/23 Time HH Agency Contacted: 1600 Representative spoke with at [facility] Agency: Amy  Social Drivers of Health (SDOH) Interventions SDOH Screenings   Food Insecurity: No Food Insecurity (12/08/2023)  Housing: Low Risk   (12/08/2023)  Transportation Needs: No Transportation Needs (12/08/2023)  Utilities: Not At Risk (12/08/2023)  Depression (PHQ2-9): Low Risk  (11/22/2023)  Financial Resource Strain: Low Risk  (09/05/2019)   Received from [facility]  Physical Activity: Unknown (09/05/2019)   [facility]  Social Connections: Moderately Isolated (12/08/2023)  Stress: No Stress Concern Present (09/05/2019)   Received from [facility]  Tobacco Use: Medium Risk (12/09/2023)     Readmission Risk Interventions     No data to display

## 2024-01-02 NOTE — Discharge Summary (Signed)
 Physician Discharge Summary  Brittany Acosta FMW:[id number] DOB: 1950/05/21 DOA: 12/08/2023  PCP: [name], MD  Admit date: 12/08/2023 Discharge date: 01/02/2024  Admitted From: Home Disposition:  SNF  Recommendations for Outpatient Follow-up:  Follow up with PCP in 1-2 weeks Please obtain BMP/CBC in one week   Home Health:No Equipment/Devices:None  Discharge Condition:Stable CODE STATUS:Full Diet recommendation: Heart Healthy   Brief/Interim Summary:  73 y.o. female past medical history of mellitus type 2 insulin -dependent last hemoglobin A1c of 5.6, essential hypertension history of breast cancer started developing shortness of breath and chest pain about 3 days prior to admission, CT angio of the chest was concerning for a large pericardial effusion, she status post pericardiocentesis in the OR, developed A-fib with RVR on 12/13/2023 started on amiodarone  drip converted back to sinus rhythm.  Developed poor oral intake eventually developed hypoglycemia, colchicine  DC on 12/19/2023.   Discharge Diagnoses:  Principal Problem:   Pericardial effusion Active Problems:   Hypothyroidism   Hypertension associated with diabetes (HCC)   Dyspnea   Poorly controlled type 2 diabetes mellitus with circulatory disorder (HCC)   Gout, tophaceous   Psoriatic arthropathy (HCC)   Hyponatremia   Atrial flutter (HCC)   Acute idiopathic pericarditis   Pleural effusion, bilateral  Acute idiopathic pericarditis/pericardial effusion: Unclear etiology started on colchicine  and ibuprofen . Colchicine  had to be discontinued 12/19/2023 due to profound diarrhea and nausea vomiting. Repeated 2D echo showed resolution of effusion. She will continue ibuprofen  as an outpatient for 2 more days.  Paroxysmal A-fib with RVR: Started on amiodarone  drip now converted back to sinus rhythm. She will continue amiodarone  as an outpatient. Cardiology recommended to hold anticoagulation as this was likely due to her  pericardial effusion.  Nausea vomiting and diarrhea: Likely due to colchicine  now resolved.  Hypokalemia: Repleted now improved.  Hypomagnesemia: Repleted now improved.  Hypervolemic hyponatremia: Resolved with diuresis.  Hypothyroidism: Continue Synthroid .  New onset multifocal pneumonia: CT of the chest shows small right and left pleural effusion with basilar consolidation. She will start empirically on IV vancomycin  and her fever resolved shortness of breath improved. She was transition to oral Doxy and Augmentin  which she will continue 7 days and outpatient. She  remained afebrile since then.  Discharge Instructions  Discharge Instructions     Diet - low sodium heart healthy   Complete by: As directed    Increase activity slowly   Complete by: As directed       Allergies as of 01/02/2024   No Known Allergies      Medication List     STOP taking these medications    Biotin  10000 MCG Tabs   BLACK ELDERBERRY(BERRY-FLOWER) PO   cetirizine 10 MG tablet Commonly known as: ZYRTEC   CINNAMON PO       TAKE these medications    allopurinol  300 MG tablet Commonly known as: ZYLOPRIM  Take 1 tablet (300 mg total) by mouth daily.   amiodarone  200 MG tablet Commonly known as: PACERONE  Take 1 tablet (200 mg total) by mouth daily.   amLODipine  10 MG tablet Commonly known as: NORVASC  Take 1 tablet (10 mg total) by mouth daily.   amoxicillin -clavulanate 875-125 MG tablet Commonly known as: AUGMENTIN  Take 1 tablet by mouth every 12 (twelve) hours for 7 days.   aspirin  EC 81 MG tablet Take 1 tablet (81 mg total) by mouth daily. Swallow whole.   cyclobenzaprine  10 MG tablet Commonly known as: FLEXERIL  Take 1 tablet (10 mg) by mouth at bedtime  as needed for muscle spasms.   doxepin  10 MG capsule Commonly known as: SINEQUAN  Take 1 capsule (10 mg total) by mouth daily.   doxycycline  100 MG tablet Commonly known as: VIBRA -TABS Take 1 tablet (100 mg  total) by mouth every 12 (twelve) hours for 7 days.   EARACHE DROPS OT Place 1-2 drops in ear(s) 3 (three) times daily as needed (ear pain.).   ferrous sulfate  325 (65 FE) MG tablet Take 325 mg by mouth in the morning.   fluticasone  50 MCG/ACT nasal spray Commonly known as: FLONASE  Place 2 sprays into both nostrils daily. What changed:  when to take this reasons to take this   folic acid  1 MG tablet Commonly known as: FOLVITE  Take 2 tablets (2 mg total) by mouth in the morning.   furosemide  40 MG tablet Commonly known as: LASIX  Take 1 tablet (40 mg total) by mouth daily.   hydrALAZINE  25 MG tablet Commonly known as: APRESOLINE  Take 1 tablet (25 mg total) by mouth every 8 (eight) hours.   ibuprofen  200 MG tablet Commonly known as: ADVIL  Take 1 tablet (200 mg total) by mouth 3 (three) times daily for 2 days.   lamoTRIgine  200 MG tablet Commonly known as: LAMICTAL  TAKE 1 TABLET (200 MG TOTAL) BY MOUTH DAILY AT 12 NOON.   levothyroxine  50 MCG tablet Commonly known as: SYNTHROID  Take 1 tablet (50 mcg total) by mouth daily.   lisinopril  5 MG tablet Commonly known as: ZESTRIL  Take 1 tablet (5 mg total) by mouth daily.   magnesium  oxide 400 (240 Mg) MG tablet Commonly known as: MAG-OX Take 400 mg by mouth in the morning.   metFORMIN  500 MG tablet Commonly known as: GLUCOPHAGE  Take 2 tablets (1,000 mg total) by mouth every morning AND 1 tablet (500 mg total) every evening.   omeprazole  20 MG capsule Commonly known as: PRILOSEC TAKE 1 CAPSULE (20 MG TOTAL) BY MOUTH DAILY. TAKE 30 MINUTES BEFORE A MEAL   ondansetron  4 MG disintegrating tablet Commonly known as: ZOFRAN -ODT Take 1 tablet (4 mg total) by mouth every 4 (four) hours as needed for nausea/vomiting   OneTouch Delica Plus Lancet33G Misc Use as directed to monitor glucose daily and as needed.   OneTouch Verio test strip Generic drug: glucose blood TEST BLOOD SUGAR EVERY DAY AND AS NEEDED   oxybutynin  10  MG 24 hr tablet Commonly known as: DITROPAN -XL Take 1 tablet (10 mg total) by mouth at bedtime.   potassium chloride  SA 20 MEQ tablet Commonly known as: KLOR-CON  M Take 1 tablet (20 mEq total) by mouth daily.   pravastatin  20 MG tablet Commonly known as: PRAVACHOL  Take 1 tablet (20 mg total) by mouth at bedtime.   sertraline  100 MG tablet Commonly known as: ZOLOFT  Take 1 tablet (100 mg total) by mouth at bedtime.   sulfaSALAzine  500 MG tablet Commonly known as: AZULFIDINE  Take 2 tablets (1,000 mg total) by mouth 2 (two) times daily.   Systane Ultra 0.4-0.3 % Soln Generic drug: Polyethyl Glycol-Propyl Glycol Place 1-2 drops into both eyes 3 (three) times daily as needed (dry/irritated eyes).   tamsulosin  0.4 MG Caps capsule Commonly known as: FLOMAX  Take 1 capsule (0.4 mg total) by mouth at bedtime.   Tresiba  FlexTouch 100 UNIT/ML FlexTouch Pen Generic drug: insulin  degludec Inject 26 Units into the skin in the morning. What changed: how much to take   Vitamin D3 125 MCG (5000 UT) Caps Take 5,000 Units by mouth at bedtime.  Durable Medical Equipment  (From admission, onward)           Start     Ordered   12/13/23 1636  For home use only DME 4 wheeled rolling walker with seat  Once       Question:  Patient needs a walker to treat with the following condition  Answer:  Physical deconditioning   12/13/23 1636            Contact information for follow-up providers     [name], MD Follow up.   Specialty: Family Medicine Why: please call to arrange hospital follow up appt in 7-14 days Contact information: [address] [place] [address] [phone]         [facility] Follow up.   Why: Physical and Occupational Therapy-office to call with visit times. Contact information: [address] [place] [address] [phone]              Contact information for after-discharge care      Destination     [facility]   Service: Skilled Nursing Contact information: [address] [place]  [address] [phone]                    No Known Allergies  Consultations: Cardiology   Procedures/Studies: ECHOCARDIOGRAM LIMITED Result Date: 12/30/2023    ECHOCARDIOGRAM LIMITED REPORT   Patient Name:   Brittany Acosta Date of Exam: 12/30/2023 Medical Rec #:  [id number]     Height:       61.5 in Accession #:    [id number]    Weight:       213.7 lb Date of Birth:  February 23, 1951      BSA:          1.955 m Patient Age:    73 years      BP:           131/69 mmHg Patient Gender: F             HR:           68 bpm. Exam Location:  Inpatient Procedure: Limited Echo, Limited Color Doppler and Cardiac Doppler (Both            Spectral and Color Flow Doppler were utilized during procedure). Indications:     R/O Pericardial effusion  History:         Patient has prior history of Echocardiogram examinations.                  Signs/Symptoms:Dyspnea.  Sonographer:     Norleen Amour Referring Phys:  [id number] WADDELL A PARCELLS Diagnosing Phys: Oneil Parchment MD IMPRESSIONS  1. Left ventricular ejection fraction, by estimation, is 60 to 65%. The left ventricle has normal function.  2. Right ventricular systolic function is normal. There is normal pulmonary artery systolic pressure.  3. The mitral valve is normal in structure. Mild mitral valve regurgitation.  4. Tricuspid valve regurgitation is mild to moderate.  5. The aortic valve is tricuspid. Aortic valve regurgitation is not visualized. No aortic stenosis is present.  6. The inferior vena cava is normal in size with greater than 50% respiratory variability, suggesting right atrial pressure of 3 mmHg. FINDINGS  Left Ventricle: Left ventricular ejection fraction, by estimation, is 60 to 65%. The left ventricle has normal function. There is no left ventricular hypertrophy. Right Ventricle: Right ventricular  systolic function  is normal. There is normal pulmonary artery systolic pressure. The tricuspid regurgitant velocity is 2.70 m/s, and with an assumed right atrial pressure of 3 mmHg, the estimated right ventricular systolic pressure is 32.2 mmHg. Left Atrium: Left atrial size was normal in size. Right Atrium: Right atrial size was normal in size. Pericardium: There is no evidence of pericardial effusion. Mitral Valve: The mitral valve is normal in structure. Mild mitral valve regurgitation. Tricuspid Valve: The tricuspid valve is normal in structure. Tricuspid valve regurgitation is mild to moderate. Aortic Valve: The aortic valve is tricuspid. Aortic valve regurgitation is not visualized. No aortic stenosis is present. Aorta: The aortic root is normal in size and structure. Venous: The inferior vena cava is normal in size with greater than 50% respiratory variability, suggesting right atrial pressure of 3 mmHg. LEFT VENTRICLE PLAX 2D LVIDd:         4.60 cm     Diastology LVIDs:         2.90 cm     LV e' medial:    6.96 cm/s LV PW:         0.80 cm     LV E/e' medial:  18.7 LV IVS:        1.10 cm     LV e' lateral:   12.10 cm/s                            LV E/e' lateral: 10.7  LV Volumes (MOD) LV vol d, MOD A2C: 60.3 ml LV vol d, MOD A4C: 82.9 ml LV vol s, MOD A2C: 20.7 ml LV vol s, MOD A4C: 16.7 ml LV SV MOD A2C:     39.6 ml LV SV MOD A4C:     82.9 ml LV SV MOD BP:      52.2 ml RIGHT VENTRICLE             IVC RV S prime:     11.70 cm/s  IVC diam: 1.30 cm TAPSE (M-mode): 2.0 cm LEFT ATRIUM         Index LA diam:    3.70 cm 1.89 cm/m  PULMONIC VALVE PV Vmax:       1.51 m/s PV Peak grad:  9.1 mmHg  MITRAL VALVE                TRICUSPID VALVE MV Area (PHT): 2.50 cm     TR Peak grad:   29.2 mmHg MV Decel Time: 304 msec     TR Vmax:        270.00 cm/s MV E velocity: 130.00 cm/s MV A velocity: 88.10 cm/s MV E/A ratio:  1.48 Oneil Parchment MD Electronically signed by Oneil Parchment MD Signature Date/Time: 12/30/2023/3:56:54 PM     Final (Updated)    CT CHEST WO CONTRAST Result Date: 12/29/2023 CLINICAL DATA:  Chronic dyspnea of unclear etiology.  Chest pain. EXAM: CT CHEST WITHOUT CONTRAST TECHNIQUE: Multidetector CT imaging of the chest was performed following the standard protocol without IV contrast. RADIATION DOSE REDUCTION: This exam was performed according to the departmental dose-optimization program which includes automated exposure control, adjustment of the mA and/or kV according to patient size and/or use of iterative reconstruction technique. COMPARISON:  Chest radiograph December 29, 2023. CT chest December 08, 2023. FINDINGS: Cardiovascular: Normal heart size. Small pericardial effusion. Normal caliber thoracic aorta. No significant vascular calcification. Mediastinum/Nodes: Esophagus is decompressed. Scattered lymph nodes are not pathologically enlarged. Thyroid  gland is  unremarkable. Lungs/Pleura: Small right and moderate left pleural effusions. Basilar atelectasis or consolidation bilaterally. This could represent compressive atelectasis or pneumonia. These changes have progressed since the prior study. Several scattered pulmonary nodules are demonstrated. Largest is in the left upper lung peripherally measuring four mm diameter. Nodules are unchanged since prior study. No imaging followup is indicated based on size criteria. No pneumothorax. Upper Abdomen: Surgical absence of the gallbladder. No acute abnormalities. Musculoskeletal: Degenerative changes in the spine. Postoperative change in the right shoulder. Left shoulder is incompletely visualized but demonstrates cystic changes, likely representing degenerative cyst. No change since prior study. No acute bony abnormalities. IMPRESSION: Small right and moderate left pleural effusions with basilar consolidation or atelectasis. This is progressed since prior study. Small pericardial effusion is decreased. Otherwise, no change. Electronically Signed   By: Elsie Gravely M.D.   On: 12/29/2023 19:42   DG CHEST PORT 1 VIEW Result Date: 12/29/2023 CLINICAL DATA:  Pericardial effusion, chest pain, and dyspnea EXAM: PORTABLE CHEST 1 VIEW COMPARISON:  Chest radiograph dated 12/24/2023 FINDINGS: Normal lung volumes. Persistent dense left retrocardiac opacity. Trace blunting of left costophrenic angle. No pneumothorax. Similar enlarged cardiomediastinal silhouette. Right shoulder arthroplasty. IMPRESSION: 1. Persistent dense left retrocardiac opacity, which may represent atelectasis, aspiration, or pneumonia. 2. Trace left pleural effusion. 3. Similar enlarged cardiomediastinal silhouette. Electronically Signed   By: Limin  Xu M.D.   On: 12/29/2023 14:23   DG HIP UNILAT WITH PELVIS 2-3 VIEWS RIGHT Result Date: 12/25/2023 CLINICAL DATA:  Fall with right hip pain. EXAM: DG HIP (WITH OR WITHOUT PELVIS) 2-3V RIGHT COMPARISON:  12/24/2023. FINDINGS: There is no evidence of acute hip fracture or dislocation. Mild degenerative changes are noted at the hips bilaterally and in the lower lumbar spine. Lumbar spinal fusion hardware is present. IMPRESSION: No acute fracture or dislocation. Electronically Signed   By: Leita Birmingham M.D.   On: 12/25/2023 17:54   DG CHEST PORT 1 VIEW Result Date: 12/24/2023 CLINICAL DATA:  Pneumonia.  Ileus. EXAM: PORTABLE CHEST 1 VIEW COMPARISON:  12/21/2023 FINDINGS: Stable mild cardiomegaly. Stable opacity in left retrocardiac lung base, which may be due to atelectasis or infiltrate. Right lung is clear. Right shoulder prosthesis again noted. IMPRESSION: Stable left retrocardiac atelectasis versus infiltrate. Electronically Signed   By: Norleen DELENA Kil M.D.   On: 12/24/2023 14:54   DG Abd Portable 1V Result Date: 12/24/2023 CLINICAL DATA:  Ileus. EXAM: PORTABLE ABDOMEN - 1 VIEW COMPARISON:  None Available. FINDINGS: Diffuse mild dilatation of small bowel and colon, consistent with ileus pattern. Right upper quadrant surgical clips noted. Lumbar spine  fusion hardware also noted. IMPRESSION: Diffuse dilatation of small bowel and colon, consistent with ileus pattern. Electronically Signed   By: Norleen DELENA Kil M.D.   On: 12/24/2023 14:53   DG CHEST PORT 1 VIEW Result Date: 12/21/2023 EXAM: 1 VIEW XRAY OF THE CHEST 12/21/2023 11:34:00 AM COMPARISON: 12/17/2023 CLINICAL HISTORY: SOB (shortness of breath) 141880. Reason for exam: SOB FINDINGS: LUNGS AND PLEURA: Increased left basilar opacity. Mild diffuse interstitial opacities. Trace left pleural effusion. No pneumothorax. HEART AND MEDIASTINUM: Cardiomegaly unchanged. BONES AND SOFT TISSUES: Right shoulder arthroplasty noted. Left axillary surgical clips noted. No acute osseous abnormality. IMPRESSION: 1. Increased left basilar opacities. 2. Trace left pleural effusion. 3. Cardiomegaly, unchanged. Electronically signed by: Donnice Mania MD 12/21/2023 12:47 PM EDT RP Workstation: [id number]   DG CHEST PORT 1 VIEW Result Date: 12/17/2023 CLINICAL DATA:  Dyspnea EXAM: PORTABLE CHEST 1 VIEW COMPARISON:  12/13/2023 FINDINGS: Single frontal  view of the chest demonstrates mild enlargement of the cardiac silhouette. No airspace disease, effusion, or pneumothorax. Stable right shoulder arthroplasty. No acute fractures. IMPRESSION: 1. Mild enlargement of the cardiac silhouette. 2. No acute airspace disease. Electronically Signed   By: Ozell Daring M.D.   On: 12/17/2023 14:52   DG Chest Port 1 View Result Date: 12/13/2023 CLINICAL DATA:  Wheezing EXAM: PORTABLE CHEST 1 VIEW COMPARISON:  12/11/2023 FINDINGS: Single frontal view of the chest demonstrates stable enlargement of the cardiac silhouette. No acute airspace disease, effusion, or pneumothorax. No acute bony abnormalities. IMPRESSION: 1. Stable chest, no acute process. Electronically Signed   By: Ozell Daring M.D.   On: 12/13/2023 23:59   ECHOCARDIOGRAM LIMITED Result Date: 12/13/2023    ECHOCARDIOGRAM LIMITED REPORT   Patient Name:   Brittany Acosta Date of  Exam: 12/13/2023 Medical Rec #:  [id number]     Height:       61.5 in Accession #:    [id number]    Weight:       214.7 lb Date of Birth:  1951-02-03      BSA:          1.958 m Patient Age:    73 years      BP:           148/98 mmHg Patient Gender: F             HR:           95 bpm. Exam Location:  Inpatient Procedure: Limited Echo (Both Spectral and Color Flow Doppler were utilized            during procedure). Indications:    Pericardial effusion I31.3  History:        Patient has prior history of Echocardiogram examinations, most                 recent 12/10/2023. Pericardial effusion S/P peracentsis, Limited                 echo to check for effusion.  Sonographer:    BERNARDA ROCKS Referring Phys: [id number] AMY D CLEGG IMPRESSIONS  1. Left ventricular ejection fraction, by estimation, is 65 to 70%. The left ventricle has normal function. The left ventricle has no regional wall motion abnormalities.  2. Right ventricular systolic function is normal. The right ventricular size is normal.  3. The mitral valve is normal in structure.  4. The aortic valve is normal in structure.  5. The inferior vena cava is normal in size with greater than 50% respiratory variability, suggesting right atrial pressure of 3 mmHg. Comparison(s): No significant change from prior study. Prior images reviewed side by side. There is no evidence of recurrent pericardial effusion. FINDINGS  Left Ventricle: Left ventricular ejection fraction, by estimation, is 65 to 70%. The left ventricle has normal function. The left ventricle has no regional wall motion abnormalities. Right Ventricle: The right ventricular size is normal. Right ventricular systolic function is normal. Pericardium: There is no evidence of pericardial effusion. Presence of epicardial fat layer. Mitral Valve: The mitral valve is normal in structure. Tricuspid Valve: The tricuspid valve is normal in structure. Aortic Valve: The aortic valve is normal in structure. Pulmonic Valve: The  pulmonic valve was not well visualized. Venous: The inferior vena cava is normal in size with greater than 50% respiratory variability, suggesting right atrial pressure of 3 mmHg. Jerel Balding MD Electronically signed by Jerel Balding MD Signature Date/Time: 12/13/2023/1:34:20 PM  Final    DG CHEST PORT 1 VIEW Result Date: 12/11/2023 CLINICAL DATA:  Cough, chest pain. EXAM: PORTABLE CHEST 1 VIEW COMPARISON:  December 08, 2023. FINDINGS: Stable cardiomegaly. Lungs are clear. Status post right shoulder arthroplasty. IMPRESSION: No active disease. Electronically Signed   By: Lynwood Landy Raddle M.D.   On: 12/11/2023 17:20   ECHOCARDIOGRAM LIMITED Result Date: 12/10/2023    ECHOCARDIOGRAM LIMITED REPORT   Patient Name:   Brittany Acosta Date of Exam: 12/10/2023 Medical Rec #:  [id number]     Height:       61.5 in Accession #:    [id number]    Weight:       209.0 lb Date of Birth:  04/11/1950      BSA:          1.936 m Patient Age:    73 years      BP:           142/99 mmHg Patient Gender: F             HR:           93 bpm. Exam Location:  Inpatient Procedure: Limited Echo (Both Spectral and Color Flow Doppler were utilized            during procedure). Indications:    pericardial effusion  History:        Patient has prior history of Echocardiogram examinations, most                 recent 12/09/2023. Arrythmias:RBBB and Atrial Fibrillation,                 Signs/Symptoms:Edema; Risk Factors:Sleep Apnea, Diabetes,                 Hypertension and Dyslipidemia.  Sonographer:    Tinnie Barefoot RDCS Referring Phys: [phone] MICHAEL COOPER IMPRESSIONS  1. Limited Echo to evaluate for pericardial effusion, s/p pericardiocentesis on 12/09/2023.  2. No evidence of pericardial effusion.  3. Left ventricular ejection fraction, by estimation, is 60 to 65%. The left ventricle has normal function. The left ventricle has no regional wall motion abnormalities.  4. Right ventricular systolic function is normal. The right ventricular size  is normal. There is normal pulmonary artery systolic pressure.  5. The inferior vena cava is normal in size with greater than 50% respiratory variability, suggesting right atrial pressure of 3 mmHg. FINDINGS  Left Ventricle: Left ventricular ejection fraction, by estimation, is 60 to 65%. The left ventricle has normal function. The left ventricle has no regional wall motion abnormalities. The left ventricular internal cavity size was normal in size. There is  no left ventricular hypertrophy. Right Ventricle: The right ventricular size is normal. No increase in right ventricular wall thickness. Right ventricular systolic function is normal. There is normal pulmonary artery systolic pressure. The tricuspid regurgitant velocity is 2.50 m/s, and  with an assumed right atrial pressure of 3 mmHg, the estimated right ventricular systolic pressure is 28.0 mmHg. Left Atrium: Left atrial size was normal in size. Right Atrium: Right atrial size was normal in size. Pericardium: There is no evidence of pericardial effusion. Mitral Valve: The mitral valve is normal in structure. No evidence of mitral valve stenosis. Tricuspid Valve: The tricuspid valve is normal in structure. Tricuspid valve regurgitation is trivial. No evidence of tricuspid stenosis. Aortic Valve: The aortic valve has an indeterminant number of cusps. Aortic valve regurgitation is not visualized. No aortic stenosis is present. Pulmonic Valve: The  pulmonic valve was normal in structure. Pulmonic valve regurgitation is not visualized. No evidence of pulmonic stenosis. Aorta: Aortic root could not be assessed. Venous: The inferior vena cava is normal in size with greater than 50% respiratory variability, suggesting right atrial pressure of 3 mmHg. IAS/Shunts: No atrial level shunt detected by color flow Doppler. Additional Comments: Spectral Doppler performed. Color Doppler performed.  LEFT VENTRICLE PLAX 2D LVIDd:         4.20 cm LVIDs:         2.60 cm LV PW:          1.00 cm LV IVS:        1.10 cm  LEFT ATRIUM         Index LA diam:    3.60 cm 1.86 cm/m  AORTIC VALVE LVOT Vmax:   131.00 cm/s LVOT Vmean:  87.067 cm/s LVOT VTI:    0.220 m TRICUSPID VALVE TR Peak grad:   25.0 mmHg TR Vmax:        250.00 cm/s  SHUNTS Systemic VTI: 0.22 m Vishnu Priya Mallipeddi Electronically signed by Diannah Late Mallipeddi Signature Date/Time: 12/10/2023/11:28:08 AM    Final    ECHOCARDIOGRAM LIMITED Result Date: 12/09/2023    ECHOCARDIOGRAM LIMITED REPORT   Patient Name:   Brittany Acosta Date of Exam: 12/09/2023 Medical Rec #:  [id number]     Height:       61.5 in Accession #:    [id number]    Weight:       209.0 lb Date of Birth:  04/16/1950      BSA:          1.936 m Patient Age:    73 years      BP:           140/70 mmHg Patient Gender: F             HR:           93 bpm. Exam Location:  Inpatient Procedure: Limited Echo (Both Spectral and Color Flow Doppler were utilized            during procedure). Indications:    Pericardiocentisis  History:        Patient has prior history of Echocardiogram examinations, most                 recent 12/09/2023. Pericardial Disease.  Sonographer:    Jayson Gaskins Referring Phys: [id number] MICHAEL COOPER IMPRESSIONS  1. Left ventricular ejection fraction, by estimation, is 60 to 65%. The left ventricle has normal function.  2. Limited Echocardiogram - Echo guided Pericardiocentisis.  3. Pre-procedure: Moderate size, circumferential, RA collapse.  4. 700cc of fluid drained.  5. Post-procedure: LVEF 60-65% and no pericardial effusion noted on apical 4 image. FINDINGS  Left Ventricle: Left ventricular ejection fraction, by estimation, is 60 to 65%. The left ventricle has normal function. [employer] Electronically signed by Madonna Large Signature Date/Time: 12/09/2023/4:46:06 PM    Final    CARDIAC CATHETERIZATION Result Date: 12/09/2023 Successful pericardiocentesis under fluoroscopic and echo guidance with removal of 700 cc bloody pericardial fluid Recommend: drain  secured in place. Monitor output, limited echo in am. Fluid sent to lab for analysis.   ECHOCARDIOGRAM LIMITED Result Date: 12/09/2023    ECHOCARDIOGRAM LIMITED REPORT   Patient Name:   Brittany Acosta Date of Exam: 12/09/2023 Medical Rec #:  [id number]     Height:       61.5 in Accession #:  [id number]    Weight:       209.0 lb Date of Birth:  [birth year]      BSA:          1.936 m Patient Age:    73 years      BP:           151/76 mmHg Patient Gender: F             HR:           88 bpm. Exam Location:  Inpatient Procedure: 2D Echo, Limited Echo, Cardiac Doppler and Color Doppler (Both            Spectral and Color Flow Doppler were utilized during procedure). Indications:    Pericardial effusion  History:        Patient has prior history of Echocardiogram examinations, most                 recent 09/28/2023. Risk Factors:Hypertension, Diabetes,                 Dyslipidemia and Sleep Apnea.  Sonographer:    Therisa Crouch Referring Phys: [id number] JOETTE PEBBLES IMPRESSIONS  1. Limited Echocardiogram, see echo dataed 09/29/2023 for additional details.  2. Left ventricular ejection fraction, by estimation, is 65 to 70%. The left ventricle has normal function.  3. Moderate size pericardial effusion, circumferential, right atrial collapse in systole, respiratory variation across the mitral valve approximately 25%, but IVC compressible. Findings to suggest possible early tamponade physiology. Patient is hypertensive and HR about 90-100bpm. Clinical correlation required - if considering conservative measure recommend repeating echo in 24hr or sooner if change in clinical status. Will update rounding cardiology team.  4. The inferior vena cava is normal in size with greater than 50% respiratory variability, suggesting right atrial pressure of 3 mmHg. Comparison(s): A prior study was performed on 09/29/2023. Pericardial effusion is new. FINDINGS  Left Ventricle: Left ventricular ejection fraction, by estimation, is 65 to 70%. The  left ventricle has normal function. Pericardium: Moderate size pericardial effusion, circumferential, right atrial collapse in systole, respiratory variation across the mitral valve approximately 25%, but IVC compressible. Findings to suggest possible early tamponade physiology. Patient is  hypertensive and HR about 90-100bpm. Clinical correlation required - if considering conservative measure recommend repeating echo in 24hr or sooner if change in clinical status. Will update rounding cardiology team. Venous: The inferior vena cava is normal in size with greater than 50% respiratory variability, suggesting right atrial pressure of 3 mmHg.  LV Volumes (MOD) LV vol d, MOD A2C: 48.6 ml LV vol d, MOD A4C: 55.3 ml LV vol s, MOD A2C: 13.9 ml LV vol s, MOD A4C: 19.1 ml LV SV MOD A2C:     34.7 ml LV SV MOD A4C:     55.3 ml LV SV MOD BP:      35.9 ml IVC IVC diam: 1.80 cm [employer] Electronically signed by Madonna Large Signature Date/Time: 12/09/2023/10:27:03 AM    Final    CT Angio Chest PE W and/or Wo Contrast Result Date: 12/08/2023 CLINICAL DATA:  High probability for PE.  Chest pain. EXAM: CT ANGIOGRAPHY CHEST WITH CONTRAST TECHNIQUE: Multidetector CT imaging of the chest was performed using the standard protocol during bolus administration of intravenous contrast. Multiplanar CT image reconstructions and MIPs were obtained to evaluate the vascular anatomy. RADIATION DOSE REDUCTION: This exam was performed according to the departmental dose-optimization program which includes automated exposure control, adjustment of the mA and/or kV  according to patient size and/or use of iterative reconstruction technique. CONTRAST:  75mL OMNIPAQUE  IOHEXOL  350 MG/ML SOLN COMPARISON:  None Available. FINDINGS: Cardiovascular: Satisfactory opacification of the pulmonary arteries to the segmental level. No evidence of pulmonary embolism. Normal heart size. There is a moderate-to-large sized pericardial effusion. Mediastinum/Nodes: No  enlarged mediastinal, hilar, or axillary lymph nodes. Thyroid  gland, trachea, and esophagus demonstrate no significant findings. Lungs/Pleura: There is a trace left pleural effusion. There is minimal atelectasis in the left lower lobe. The lungs are otherwise clear. There is no pneumothorax. Upper Abdomen: No acute abnormality. Cholecystectomy clips are present. Musculoskeletal: There severe degenerative changes of the left shoulder. Right shoulder arthroplasty is present. Review of the MIP images confirms the above findings. IMPRESSION: 1. No evidence for pulmonary embolism. 2. Moderate-to-large sized pericardial effusion. 3. Trace left pleural effusion. Electronically Signed   By: Greig Pique M.D.   On: 12/08/2023 19:03   DG Chest 2 View Result Date: 12/08/2023 CLINICAL DATA:  Chest pain. EXAM: CHEST - 2 VIEW COMPARISON:  Chest radiograph dated 01/29/2021. FINDINGS: No focal consolidation, pleural effusion or pneumothorax. Top-normal cardiac size. No acute osseous pathology. Over shoulder arthroplasty. IMPRESSION: No active cardiopulmonary disease. Electronically Signed   By: Vanetta Chou M.D.   On: 12/08/2023 17:23     Subjective: She feels great in a good mood this morning she relates her shortness of breath has resolved completely.  And feels back to baseline from a breathing standpoint.  Discharge Exam: Vitals:   01/02/24 0759 01/02/24 0800  BP: (!) 161/75 (!) 161/75  Pulse: 69   Resp: 19   Temp: 98.4 F (36.9 C)   SpO2: 97%    Vitals:   01/01/24 2324 01/02/24 0616 01/02/24 0759 01/02/24 0800  BP: (!) 96/50 (!) 139/50 (!) 161/75 (!) 161/75  Pulse: (!) 55 85 69   Resp: 17 19 19    Temp: 97.7 F (36.5 C) 98.8 F (37.1 C) 98.4 F (36.9 C)   TempSrc: Oral Oral Oral   SpO2: 97%  97%   Weight:  94.7 kg    Height:        General: Pt is alert, awake, not in acute distress Cardiovascular: RRR, S1/S2 +, no rubs, no gallops Respiratory: CTA bilaterally, no wheezing, no  rhonchi Abdominal: Soft, NT, ND, bowel sounds + Extremities: no edema, no cyanosis    The results of significant diagnostics from this hospitalization (including imaging, microbiology, ancillary and laboratory) are listed below for reference.     Microbiology: Recent Results (from the past 240 hours)  MRSA Next Gen by PCR, Nasal     Status: None   Collection Time: 12/30/23 10:02 AM   Specimen: Nasal Mucosa; Nasal Swab  Result Value Ref Range Status   MRSA by PCR Next Gen NOT DETECTED NOT DETECTED Final    Comment: (NOTE) The GeneXpert MRSA Assay (FDA approved for NASAL specimens only), is one component of a comprehensive MRSA colonization surveillance program. It is not intended to diagnose MRSA infection nor to guide or monitor treatment for MRSA infections. Test performance is not FDA approved in patients less than 76 years old. Performed at [facility], [address] [place], [place] [address]      Labs: BNP (last 3 results) Recent Labs    12/14/23 0222 12/31/23 0430  BNP 244.9* 364.7*   Basic Metabolic Panel: Recent Labs  Lab 12/27/23 0446 12/27/23 1330 12/28/23 0544 12/28/23 0854 12/30/23 0827 12/31/23 0430 01/01/24 0330 01/02/24 0612  NA  137 135 134*  --   --  136 135 134*  K 2.6* 4.0 4.5  --   --  3.4* 4.2 4.1  CL 101 97* 100  --   --  95* 99 98  CO2 25 25 24   --   --  29 27 25   GLUCOSE 107* 110* 139*  --   --  125* 132* 123*  BUN 5* <5* <5*  --   --  <5* 5* 5*  CREATININE 0.63 0.55 0.59  --   --  0.66 0.80 0.70  CALCIUM  8.3* 8.8* 8.9  --   --  9.0 8.8* 8.9  MG 1.3*  --   --  1.8 1.4*  --  1.7  --    Liver Function Tests: Recent Labs  Lab 12/26/23 0953 12/27/23 0446 12/28/23 0544  AST 21 23 23   ALT 16 16 16   ALKPHOS 98 93 103  BILITOT 0.6 0.3 0.4  PROT 5.7* 5.5* 5.8*  ALBUMIN  2.5* 2.3* 2.6*   No results for input(s): LIPASE, AMYLASE in the last 168 hours. No results for input(s): AMMONIA in the last 168 hours. CBC: Recent  Labs  Lab 12/28/23 0544 12/29/23 1848 12/30/23 0701 12/31/23 0953 01/01/24 2058  WBC 10.6* 12.4* 11.5* 9.1 14.7*  NEUTROABS  --  7.2 7.1  --   --   HGB 8.5* 9.4* 10.3* 9.5* 9.7*  HCT 26.3* 28.7* 31.8* 30.3* 31.1*  MCV 83.2 82.2 83.7 84.9 85.2  PLT 768* 794* 749* 450* 554*   Cardiac Enzymes: No results for input(s): CKTOTAL, CKMB, CKMBINDEX, TROPONINI in the last 168 hours. BNP: Invalid input(s): POCBNP CBG: Recent Labs  Lab 01/01/24 0754 01/01/24 1204 01/01/24 1641 01/01/24 2057 01/02/24 0801  GLUCAP 126* 137* 115* 128* 105*   D-Dimer No results for input(s): DDIMER in the last 72 hours. Hgb A1c No results for input(s): HGBA1C in the last 72 hours. Lipid Profile No results for input(s): CHOL, HDL, LDLCALC, TRIG, CHOLHDL, LDLDIRECT in the last 72 hours. Thyroid  function studies No results for input(s): TSH, T4TOTAL, T3FREE, THYROIDAB in the last 72 hours.  Invalid input(s): FREET3 Anemia work up No results for input(s): VITAMINB12, FOLATE, FERRITIN, TIBC, IRON, RETICCTPCT in the last 72 hours. Urinalysis    Component Value Date/Time   COLORURINE YELLOW 12/10/2023 1953   APPEARANCEUR HAZY (A) 12/10/2023 1953   LABSPEC 1.012 12/10/2023 1953   PHURINE 5.0 12/10/2023 1953   GLUCOSEU NEGATIVE 12/10/2023 1953   GLUCOSEU NEGATIVE 12/26/2012 0934   HGBUR SMALL (A) 12/10/2023 1953   BILIRUBINUR NEGATIVE 12/10/2023 1953   BILIRUBINUR neg 11/02/2013 1522   KETONESUR NEGATIVE 12/10/2023 1953   PROTEINUR NEGATIVE 12/10/2023 1953   UROBILINOGEN 0.2 11/02/2013 1522   UROBILINOGEN 0.2 12/26/2012 0934   NITRITE NEGATIVE 12/10/2023 1953   LEUKOCYTESUR MODERATE (A) 12/10/2023 1953   Sepsis Labs Recent Labs  Lab 12/29/23 1848 12/30/23 0701 12/31/23 0953 01/01/24 2058  WBC 12.4* 11.5* 9.1 14.7*   Microbiology Recent Results (from the past 240 hours)  MRSA Next Gen by PCR, Nasal     Status: None   Collection Time:  12/30/23 10:02 AM   Specimen: Nasal Mucosa; Nasal Swab  Result Value Ref Range Status   MRSA by PCR Next Gen NOT DETECTED NOT DETECTED Final    Comment: (NOTE) The GeneXpert MRSA Assay (FDA approved for NASAL specimens only), is one component of a comprehensive MRSA colonization surveillance program. It is not intended to diagnose MRSA infection nor to guide or monitor treatment  for MRSA infections. Test performance is not FDA approved in patients less than 54 years old. Performed at [facility], [address] [place], [place] [address]      Time coordinating discharge: Over 35 minutes  SIGNED:   Erle Odell Castor, MD  [facility] 01/02/2024, 8:16 AM Pager   If 7PM-7AM, please contact night-coverage [email or web address] Password [email or web address]

## 2024-01-04 DIAGNOSIS — K219 Gastro-esophageal reflux disease without esophagitis: Secondary | ICD-10-CM | POA: Diagnosis not present

## 2024-01-04 DIAGNOSIS — J302 Other seasonal allergic rhinitis: Secondary | ICD-10-CM | POA: Diagnosis not present

## 2024-01-04 DIAGNOSIS — M6281 Muscle weakness (generalized): Secondary | ICD-10-CM | POA: Diagnosis not present

## 2024-01-04 DIAGNOSIS — I10 Essential (primary) hypertension: Secondary | ICD-10-CM | POA: Diagnosis not present

## 2024-01-04 DIAGNOSIS — N3281 Overactive bladder: Secondary | ICD-10-CM | POA: Diagnosis not present

## 2024-01-06 ENCOUNTER — Telehealth: Payer: Self-pay | Admitting: Pharmacist

## 2024-01-06 NOTE — Progress Notes (Signed)
 Pharmacy Quality Measure Review  This patient is appearing on a report for being at risk of failing the adherence measure for hypertension (ACEi/ARB) medications this calendar year.   Medication: lisinopril   Last fill date: 08/31/2023 for 90 day supply  Reviewed recent refill history in Epic and it shows RF was actually picked up 09/06/2023 for 90 DS. Patient has been in hospital for about 25 days in Spetember so has not needed RF. She did fill lisinopril  for 30 DS on 01/02/2024 when she was discharged from hospital. She has 8 refills remaining. Next appointment with PCP is not currently set up.   I tried to call patient but had to leave a message on her VM. I was hoping to review her medications since she was recently discharged.   Insurance report was not up to date. No action needed at this time.  Will continue to follow adherence.   Madelin Ray, PharmD Clinical Pharmacist [facility]  Population Health [phone]

## 2024-01-10 DIAGNOSIS — E119 Type 2 diabetes mellitus without complications: Secondary | ICD-10-CM | POA: Diagnosis not present

## 2024-01-10 DIAGNOSIS — K529 Noninfective gastroenteritis and colitis, unspecified: Secondary | ICD-10-CM | POA: Diagnosis not present

## 2024-01-10 DIAGNOSIS — K219 Gastro-esophageal reflux disease without esophagitis: Secondary | ICD-10-CM | POA: Diagnosis not present

## 2024-01-10 DIAGNOSIS — I10 Essential (primary) hypertension: Secondary | ICD-10-CM | POA: Diagnosis not present

## 2024-01-10 DIAGNOSIS — J189 Pneumonia, unspecified organism: Secondary | ICD-10-CM | POA: Diagnosis not present

## 2024-01-10 DIAGNOSIS — R531 Weakness: Secondary | ICD-10-CM | POA: Diagnosis not present

## 2024-01-10 DIAGNOSIS — I3139 Other pericardial effusion (noninflammatory): Secondary | ICD-10-CM | POA: Diagnosis not present

## 2024-01-10 DIAGNOSIS — I480 Paroxysmal atrial fibrillation: Secondary | ICD-10-CM | POA: Diagnosis not present

## 2024-01-10 DIAGNOSIS — M109 Gout, unspecified: Secondary | ICD-10-CM | POA: Diagnosis not present

## 2024-01-13 DIAGNOSIS — J302 Other seasonal allergic rhinitis: Secondary | ICD-10-CM | POA: Diagnosis not present

## 2024-01-13 DIAGNOSIS — M109 Gout, unspecified: Secondary | ICD-10-CM | POA: Diagnosis not present

## 2024-01-13 DIAGNOSIS — E119 Type 2 diabetes mellitus without complications: Secondary | ICD-10-CM | POA: Diagnosis not present

## 2024-01-13 DIAGNOSIS — J189 Pneumonia, unspecified organism: Secondary | ICD-10-CM | POA: Diagnosis not present

## 2024-01-13 DIAGNOSIS — N3281 Overactive bladder: Secondary | ICD-10-CM | POA: Diagnosis not present

## 2024-01-13 DIAGNOSIS — R531 Weakness: Secondary | ICD-10-CM | POA: Diagnosis not present

## 2024-01-13 DIAGNOSIS — K529 Noninfective gastroenteritis and colitis, unspecified: Secondary | ICD-10-CM | POA: Diagnosis not present

## 2024-01-13 DIAGNOSIS — M6281 Muscle weakness (generalized): Secondary | ICD-10-CM | POA: Diagnosis not present

## 2024-01-13 DIAGNOSIS — I480 Paroxysmal atrial fibrillation: Secondary | ICD-10-CM | POA: Diagnosis not present

## 2024-01-18 DIAGNOSIS — J302 Other seasonal allergic rhinitis: Secondary | ICD-10-CM | POA: Diagnosis not present

## 2024-01-18 DIAGNOSIS — M6281 Muscle weakness (generalized): Secondary | ICD-10-CM | POA: Diagnosis not present

## 2024-01-18 DIAGNOSIS — N3281 Overactive bladder: Secondary | ICD-10-CM | POA: Diagnosis not present

## 2024-01-18 DIAGNOSIS — E119 Type 2 diabetes mellitus without complications: Secondary | ICD-10-CM | POA: Diagnosis not present

## 2024-01-18 DIAGNOSIS — I480 Paroxysmal atrial fibrillation: Secondary | ICD-10-CM | POA: Diagnosis not present

## 2024-01-18 DIAGNOSIS — M109 Gout, unspecified: Secondary | ICD-10-CM | POA: Diagnosis not present

## 2024-01-20 ENCOUNTER — Telehealth: Payer: Self-pay

## 2024-01-20 NOTE — Transitions of Care (Post Inpatient/ED Visit) (Unsigned)
   01/20/2024  Name: ANGLIA BLAKLEY MRN: [id number] DOB: 25-Feb-1951  Today's TOC FU Call Status: Today's TOC FU Call Status:: Unsuccessful Call (1st Attempt) Unsuccessful Call (1st Attempt) Date: 01/20/24  Attempted to reach the patient regarding the most recent Inpatient/ED visit.  Follow Up Plan: Additional outreach attempts will be made to reach the patient to complete the Transitions of Care (Post Inpatient/ED visit) call.   Signature  Julian Lemmings, LPN [facility] Nurse Health Advisor Direct Dial [phone]

## 2024-01-23 NOTE — Transitions of Care (Post Inpatient/ED Visit) (Signed)
 01/23/2024  Name: Brittany Acosta MRN: [id number] DOB: [birth year]  Today's TOC FU Call Status: Today's TOC FU Call Status:: Successful TOC FU Call Completed Unsuccessful Call (1st Attempt) Date: 01/20/24 [facility] FU Call Complete Date: 01/23/24 Patient's Name and Date of Birth confirmed.  Transition Care Management Follow-up Telephone Call Date of Discharge: 01/19/24 Discharge Facility: Other ([facility]) Name of Other ([facility]) Discharge Facility: [facility] Type of Discharge: Inpatient Admission Primary Inpatient Discharge Diagnosis:: aflutter How have you been since you were released from the hospital?: Better Any questions or concerns?: No  Items Reviewed: Did you receive and understand the discharge instructions provided?: Yes Medications obtained,verified, and reconciled?: Yes (Medications Reviewed) Any new allergies since your discharge?: No Dietary orders reviewed?: Yes Do you have support at home?: Yes People in Home [RPT]: spouse  Medications Reviewed Today: Medications Reviewed Today     Reviewed by Emmitt Pan, LPN (Licensed Practical Nurse) on 01/23/24 at 1217  Med List Status: <None>   Medication Order Taking? Sig Documenting Provider Last Dose Status Informant  allopurinol  (ZYLOPRIM ) 300 MG tablet [id number] Yes Take 1 tablet (300 mg total) by mouth daily. [name], MD  Active Self, Pharmacy Records  amiodarone  (PACERONE ) 200 MG tablet [id number] Yes Take 1 tablet (200 mg total) by mouth daily. Odell Celinda Balo, MD  Active   amLODipine  (NORVASC ) 10 MG tablet [id number] Yes Take 1 tablet (10 mg total) by mouth daily. Odell Celinda Balo, MD  Active   aspirin  EC 81 MG tablet [id number] Yes Take 1 tablet (81 mg total) by mouth daily. Swallow whole. Lonni Slain, MD  Active Self, Pharmacy Records  Cholecalciferol  (VITAMIN D3) 125 MCG (5000 UT) CAPS [id number] Yes Take 5,000 Units by mouth at bedtime. [provider]  Active Self,  Pharmacy Records  cyclobenzaprine  (FLEXERIL ) 10 MG tablet [id number] Yes Take 1 tablet (10 mg) by mouth at bedtime as needed for muscle spasms. [name], MD  Active Self, Pharmacy Records  doxepin  (SINEQUAN ) 10 MG capsule [id number] Yes Take 1 capsule (10 mg total) by mouth daily. [name], MD  Active Self, Pharmacy Records  ferrous sulfate  325 [phone] FE) MG tablet [id number] Yes Take 325 mg by mouth in the morning. [provider]  Active Self, Pharmacy Records  fluticasone  (FLONASE ) 50 MCG/ACT nasal spray [id number] Yes Place 2 sprays into both nostrils daily.  Patient taking differently: Place 2 sprays into both nostrils daily as needed for allergies.   [name], MD  Active Self, Pharmacy Records  folic acid  (FOLVITE ) 1 MG tablet [id number] Yes Take 2 tablets (2 mg total) by mouth in the morning. [name], MD  Active Self, Pharmacy Records  furosemide  (LASIX ) 40 MG tablet [id number] Yes Take 1 tablet (40 mg total) by mouth daily. [name], MD  Active Self, Pharmacy Records  glucose blood (TRUE METRIX BLOOD GLUCOSE TEST) test strip [id number] Yes TEST BLOOD SUGAR EVERY DAY AND AS NEEDED Webb, Padonda B, FNP  Active Self, Pharmacy Records  Homeopathic Products (EARACHE DROPS OT) [id number] Yes Place 1-2 drops in ear(s) 3 (three) times daily as needed (ear pain.). [provider]  Active Self, Pharmacy Records  hydrALAZINE  (APRESOLINE ) 25 MG tablet [id number] Yes Take 1 tablet (25 mg total) by mouth every 8 (eight) hours. Odell Celinda Balo, MD  Active   insulin  degludec (TRESIBA  FLEXTOUCH) 100 UNIT/ML FlexTouch Pen [id number] Yes Inject 26 Units into the skin in the morning.  Patient taking differently: Inject 23 Units  into the skin in the morning.   [name], MD  Active Self, Pharmacy Records  lamoTRIgine  (LAMICTAL ) 200 MG tablet [id number] Yes TAKE 1 TABLET (200 MG TOTAL) BY MOUTH DAILY AT 12 NOON. [name], MD  Active Self, Pharmacy  Records  Lancets [facility] [facility] [place]) [place] [id number] Yes Use as directed to monitor glucose daily and as needed. Webb, Padonda B, FNP  Active Self, Pharmacy Records  levothyroxine  (SYNTHROID ) 50 MCG tablet [id number] Yes Take 1 tablet (50 mcg total) by mouth daily. [name], MD  Active Self, Pharmacy Records  lisinopril  (ZESTRIL ) 5 MG tablet [id number] Yes Take 1 tablet (5 mg total) by mouth daily. [name], MD  Active Self, Pharmacy Records  magnesium  oxide (MAG-OX) 400 (240 Mg) MG tablet [id number] Yes Take 400 mg by mouth in the morning. [provider]  Active Self, Pharmacy Records  metFORMIN  (GLUCOPHAGE ) 500 MG tablet [id number] Yes Take 2 tablets (1,000 mg total) by mouth every morning AND 1 tablet (500 mg total) every evening. [name], MD  Active Self, Pharmacy Records  omeprazole  [facility]) 20 MG capsule [id number] Yes TAKE 1 CAPSULE (20 MG TOTAL) BY MOUTH DAILY. TAKE 30 MINUTES BEFORE A MEAL [name], MD  Active Self, Pharmacy Records  ondansetron  (ZOFRAN -ODT) 4 MG disintegrating tablet [id number] Yes Take 1 tablet (4 mg total) by mouth every 4 (four) hours as needed for nausea/vomiting [name], MD  Active Self, Pharmacy Records  oxybutynin  (DITROPAN -XL) 10 MG 24 hr tablet [id number] Yes Take 1 tablet (10 mg total) by mouth at bedtime. [name], MD  Active Self, Pharmacy Records  Polyethyl Glycol-Propyl Glycol (SYSTANE ULTRA) 0.4-0.3 % SOLN [id number] Yes Place 1-2 drops into both eyes 3 (three) times daily as needed (dry/irritated eyes). [provider]  Active Self, Pharmacy Records  potassium chloride  SA (KLOR-CON  M) 20 MEQ tablet [id number] Yes Take 1 tablet (20 mEq total) by mouth daily. [name], MD  Active Self, Pharmacy Records  pravastatin  (PRAVACHOL ) 20 MG tablet [id number] Yes Take 1 tablet (20 mg total) by mouth at bedtime. [name], MD  Active Self, Pharmacy Records  sertraline  (ZOLOFT ) 100 MG  tablet [id number] Yes Take 1 tablet (100 mg total) by mouth at bedtime. [name], MD  Active Self, Pharmacy Records  sulfaSALAzine  (AZULFIDINE ) 500 MG tablet [id number] Yes Take 2 tablets (1,000 mg total) by mouth 2 (two) times daily.   Active Self, Pharmacy Records  tamsulosin  (FLOMAX ) 0.4 MG CAPS capsule [id number] Yes Take 1 capsule (0.4 mg total) by mouth at bedtime. [name], MD  Active Self, Pharmacy Records            Home Care and Equipment/Supplies: Were Home Health Services Ordered?: NA Any new equipment or medical supplies ordered?: NA  Functional Questionnaire: Do you need assistance with bathing/showering or dressing?: No Do you need assistance with meal preparation?: No Do you need assistance with eating?: No Do you have difficulty maintaining continence: No Do you need assistance with getting out of bed/getting out of a chair/moving?: No Do you have difficulty managing or taking your medications?: No  Follow up appointments reviewed: PCP Follow-up appointment confirmed?: Yes Date of PCP follow-up appointment?: 02/03/24 Follow-up Provider: [facility] Follow-up appointment confirmed?: NA Do you need transportation to your follow-up appointment?: No Do you understand care options if your condition(s) worsen?: Yes-patient verbalized understanding    SIGNATURE Julian Lemmings, LPN [facility] Nurse Health Advisor Direct  Dial [phone]

## 2024-01-24 ENCOUNTER — Inpatient Hospital Stay: Admitting: Family Medicine

## 2024-01-26 ENCOUNTER — Other Ambulatory Visit (HOSPITAL_COMMUNITY): Payer: Self-pay

## 2024-01-26 ENCOUNTER — Other Ambulatory Visit: Payer: Self-pay

## 2024-01-26 ENCOUNTER — Other Ambulatory Visit: Payer: Self-pay | Admitting: Family Medicine

## 2024-01-26 DIAGNOSIS — E890 Postprocedural hypothyroidism: Secondary | ICD-10-CM

## 2024-01-26 MED ORDER — LEVOTHYROXINE SODIUM 50 MCG PO TABS
50.0000 ug | ORAL_TABLET | Freq: Every day | ORAL | 3 refills | Status: DC
  Filled 2024-01-26: qty 90, 90d supply, fill #0

## 2024-01-27 ENCOUNTER — Other Ambulatory Visit (HOSPITAL_COMMUNITY): Payer: Self-pay

## 2024-02-03 ENCOUNTER — Encounter: Payer: Self-pay | Admitting: Family Medicine

## 2024-02-03 ENCOUNTER — Ambulatory Visit: Admitting: Family Medicine

## 2024-02-03 ENCOUNTER — Other Ambulatory Visit: Payer: Self-pay

## 2024-02-03 ENCOUNTER — Observation Stay (HOSPITAL_BASED_OUTPATIENT_CLINIC_OR_DEPARTMENT_OTHER)
Admission: EM | Admit: 2024-02-03 | Discharge: 2024-02-04 | Disposition: A | Discharge status: Home / Self Care | Source: Ambulatory Visit | Attending: Internal Medicine | Admitting: Internal Medicine

## 2024-02-03 ENCOUNTER — Emergency Department (HOSPITAL_BASED_OUTPATIENT_CLINIC_OR_DEPARTMENT_OTHER): Admitting: Radiology

## 2024-02-03 ENCOUNTER — Encounter (HOSPITAL_BASED_OUTPATIENT_CLINIC_OR_DEPARTMENT_OTHER): Payer: Self-pay

## 2024-02-03 VITALS — BP 122/74 | HR 84 | Temp 97.5°F | Ht 61.5 in | Wt 200.2 lb

## 2024-02-03 DIAGNOSIS — R7989 Other specified abnormal findings of blood chemistry: Secondary | ICD-10-CM | POA: Diagnosis not present

## 2024-02-03 DIAGNOSIS — K219 Gastro-esophageal reflux disease without esophagitis: Secondary | ICD-10-CM | POA: Diagnosis present

## 2024-02-03 DIAGNOSIS — E66812 Obesity, class 2: Secondary | ICD-10-CM | POA: Diagnosis not present

## 2024-02-03 DIAGNOSIS — I499 Cardiac arrhythmia, unspecified: Secondary | ICD-10-CM | POA: Diagnosis not present

## 2024-02-03 DIAGNOSIS — F319 Bipolar disorder, unspecified: Secondary | ICD-10-CM | POA: Diagnosis not present

## 2024-02-03 DIAGNOSIS — I2089 Other forms of angina pectoris: Secondary | ICD-10-CM | POA: Diagnosis present

## 2024-02-03 DIAGNOSIS — I700 Atherosclerosis of aorta: Secondary | ICD-10-CM | POA: Diagnosis not present

## 2024-02-03 DIAGNOSIS — Z79899 Other long term (current) drug therapy: Secondary | ICD-10-CM | POA: Insufficient documentation

## 2024-02-03 DIAGNOSIS — E039 Hypothyroidism, unspecified: Secondary | ICD-10-CM | POA: Diagnosis present

## 2024-02-03 DIAGNOSIS — E1140 Type 2 diabetes mellitus with diabetic neuropathy, unspecified: Principal | ICD-10-CM

## 2024-02-03 DIAGNOSIS — I444 Left anterior fascicular block: Secondary | ICD-10-CM | POA: Insufficient documentation

## 2024-02-03 DIAGNOSIS — E7800 Pure hypercholesterolemia, unspecified: Secondary | ICD-10-CM | POA: Diagnosis not present

## 2024-02-03 DIAGNOSIS — Z9889 Other specified postprocedural states: Secondary | ICD-10-CM | POA: Diagnosis not present

## 2024-02-03 DIAGNOSIS — Z794 Long term (current) use of insulin: Secondary | ICD-10-CM | POA: Diagnosis not present

## 2024-02-03 DIAGNOSIS — Z1152 Encounter for screening for COVID-19: Secondary | ICD-10-CM | POA: Diagnosis not present

## 2024-02-03 DIAGNOSIS — E119 Type 2 diabetes mellitus without complications: Secondary | ICD-10-CM | POA: Diagnosis not present

## 2024-02-03 DIAGNOSIS — R059 Cough, unspecified: Secondary | ICD-10-CM | POA: Diagnosis not present

## 2024-02-03 DIAGNOSIS — I771 Stricture of artery: Secondary | ICD-10-CM | POA: Diagnosis not present

## 2024-02-03 DIAGNOSIS — I3139 Other pericardial effusion (noninflammatory): Secondary | ICD-10-CM | POA: Diagnosis not present

## 2024-02-03 DIAGNOSIS — I2081 Angina pectoris with coronary microvascular dysfunction: Secondary | ICD-10-CM | POA: Diagnosis not present

## 2024-02-03 DIAGNOSIS — R0789 Other chest pain: Principal | ICD-10-CM | POA: Insufficient documentation

## 2024-02-03 DIAGNOSIS — I10 Essential (primary) hypertension: Secondary | ICD-10-CM | POA: Diagnosis not present

## 2024-02-03 DIAGNOSIS — I959 Hypotension, unspecified: Secondary | ICD-10-CM | POA: Diagnosis not present

## 2024-02-03 DIAGNOSIS — I452 Bifascicular block: Secondary | ICD-10-CM | POA: Diagnosis not present

## 2024-02-03 DIAGNOSIS — R051 Acute cough: Secondary | ICD-10-CM | POA: Diagnosis not present

## 2024-02-03 DIAGNOSIS — R079 Chest pain, unspecified: Principal | ICD-10-CM | POA: Diagnosis present

## 2024-02-03 DIAGNOSIS — M797 Fibromyalgia: Secondary | ICD-10-CM | POA: Diagnosis present

## 2024-02-03 DIAGNOSIS — I480 Paroxysmal atrial fibrillation: Secondary | ICD-10-CM | POA: Insufficient documentation

## 2024-02-03 DIAGNOSIS — Z7982 Long term (current) use of aspirin: Secondary | ICD-10-CM | POA: Diagnosis not present

## 2024-02-03 DIAGNOSIS — R946 Abnormal results of thyroid function studies: Secondary | ICD-10-CM | POA: Insufficient documentation

## 2024-02-03 DIAGNOSIS — Z6836 Body mass index (BMI) 36.0-36.9, adult: Secondary | ICD-10-CM | POA: Diagnosis not present

## 2024-02-03 DIAGNOSIS — Z743 Need for continuous supervision: Secondary | ICD-10-CM | POA: Diagnosis not present

## 2024-02-03 DIAGNOSIS — I152 Hypertension secondary to endocrine disorders: Secondary | ICD-10-CM | POA: Diagnosis present

## 2024-02-03 LAB — BASIC METABOLIC PANEL WITH GFR
Anion gap: 14 (ref 5–15)
BUN: 13 mg/dL (ref 8–23)
CO2: 26 mmol/L (ref 22–32)
Calcium: 10.2 mg/dL (ref 8.9–10.3)
Chloride: 94 mmol/L — ABNORMAL LOW (ref 98–111)
Creatinine, Ser: 0.68 mg/dL (ref 0.44–1.00)
GFR, Estimated: >60 mL/min (ref >60)
Glucose, Bld: 104 mg/dL — ABNORMAL HIGH (ref 70–99)
Potassium: 4.4 mmol/L (ref 3.5–5.1)
Sodium: 135 mmol/L (ref 135–145)

## 2024-02-03 LAB — CBC
HCT: 32.4 % — ABNORMAL LOW (ref 36.0–46.0)
Hemoglobin: 10.6 g/dL — ABNORMAL LOW (ref 12.0–15.0)
MCH: 27.5 pg (ref 26.0–34.0)
MCHC: 32.7 g/dL (ref 30.0–36.0)
MCV: 83.9 fL (ref 80.0–100.0)
Platelets: 454 K/uL — ABNORMAL HIGH (ref 150–400)
RBC: 3.86 MIL/uL — ABNORMAL LOW (ref 3.87–5.11)
RDW: 17.2 % — ABNORMAL HIGH (ref 11.5–15.5)
WBC: 13.1 K/uL — ABNORMAL HIGH (ref 4.0–10.5)
nRBC: 0 % (ref 0.0–0.2)

## 2024-02-03 LAB — SEDIMENTATION RATE: Sed Rate: 35 mm/h — ABNORMAL HIGH (ref 0–22)

## 2024-02-03 LAB — TROPONIN T, HIGH SENSITIVITY
Troponin T High Sensitivity: 39 ng/L — ABNORMAL HIGH (ref 0–19)
Troponin T High Sensitivity: 36 ng/L — ABNORMAL HIGH (ref 0–19)

## 2024-02-03 LAB — TROPONIN I (HIGH SENSITIVITY)
Troponin I (High Sensitivity): 7 ng/L (ref <18)
Troponin I (High Sensitivity): 8 ng/L (ref <18)

## 2024-02-03 LAB — SARS CORONAVIRUS 2 BY RT PCR: SARS Coronavirus 2 by RT PCR: NEGATIVE

## 2024-02-03 LAB — C-REACTIVE PROTEIN: CRP: 0.8 mg/dL (ref <1.0)

## 2024-02-03 MED ORDER — FLUTICASONE PROPIONATE 50 MCG/ACT NA SUSP
2.0000 | Freq: Every day | NASAL | Status: DC
  Filled 2024-02-03: qty 16

## 2024-02-03 MED ORDER — ONDANSETRON HCL 4 MG/2ML IJ SOLN: 4.0000 mg | Freq: Four times a day (QID) | INTRAMUSCULAR | Status: DC | PRN

## 2024-02-03 MED ORDER — PANTOPRAZOLE SODIUM 40 MG PO TBEC
40.0000 mg | DELAYED_RELEASE_TABLET | Freq: Every day | ORAL | Status: DC
Start: 2024-02-04 — End: 2024-02-04
  Administered 2024-02-04: 40 mg via ORAL
  Filled 2024-02-03: qty 1

## 2024-02-03 MED ORDER — CYCLOBENZAPRINE HCL 10 MG PO TABS: 10.0000 mg | ORAL_TABLET | Freq: Every evening | ORAL | Status: DC | PRN

## 2024-02-03 MED ORDER — LAMOTRIGINE 100 MG PO TABS
200.0000 mg | ORAL_TABLET | Freq: Every day | ORAL | Status: DC
  Administered 2024-02-04: 200 mg via ORAL
  Filled 2024-02-03: qty 2

## 2024-02-03 MED ORDER — INSULIN ASPART 100 UNIT/ML IJ SOLN: 0.0000 [IU] | Freq: Three times a day (TID) | INTRAMUSCULAR | Status: DC

## 2024-02-03 MED ORDER — TAMSULOSIN HCL 0.4 MG PO CAPS
0.4000 mg | ORAL_CAPSULE | Freq: Every day | ORAL | Status: DC
  Administered 2024-02-03: 0.4 mg via ORAL
  Filled 2024-02-03: qty 1

## 2024-02-03 MED ORDER — SERTRALINE HCL 100 MG PO TABS
100.0000 mg | ORAL_TABLET | Freq: Every day | ORAL | Status: DC
  Administered 2024-02-03: 100 mg via ORAL
  Filled 2024-02-03: qty 1

## 2024-02-03 MED ORDER — DILTIAZEM HCL ER COATED BEADS 180 MG PO CP24
180.0000 mg | ORAL_CAPSULE | Freq: Every day | ORAL | Status: DC
  Administered 2024-02-03 – 2024-02-04 (×2): 180 mg via ORAL
  Filled 2024-02-03 (×2): qty 1

## 2024-02-03 MED ORDER — ACETAMINOPHEN 325 MG PO TABS: 650.0000 mg | ORAL_TABLET | Freq: Four times a day (QID) | ORAL | Status: DC | PRN

## 2024-02-03 MED ORDER — ONDANSETRON HCL 4 MG PO TABS: 4.0000 mg | ORAL_TABLET | Freq: Four times a day (QID) | ORAL | Status: DC | PRN

## 2024-02-03 MED ORDER — FUROSEMIDE 40 MG PO TABS
40.0000 mg | ORAL_TABLET | Freq: Every day | ORAL | Status: DC
  Administered 2024-02-04: 40 mg via ORAL
  Filled 2024-02-03: qty 1

## 2024-02-03 MED ORDER — ALLOPURINOL 300 MG PO TABS
300.0000 mg | ORAL_TABLET | Freq: Every day | ORAL | Status: DC
Start: 2024-02-03 — End: 2024-02-04
  Administered 2024-02-03 – 2024-02-04 (×2): 300 mg via ORAL
  Filled 2024-02-03 (×2): qty 1

## 2024-02-03 MED ORDER — INSULIN ASPART 100 UNIT/ML IJ SOLN: 0.0000 [IU] | Freq: Every day | INTRAMUSCULAR | Status: DC

## 2024-02-03 MED ORDER — ASPIRIN 81 MG PO CHEW
324.0000 mg | CHEWABLE_TABLET | Freq: Once | ORAL | Status: AC
  Administered 2024-02-03: 324 mg via ORAL
  Filled 2024-02-03: qty 4

## 2024-02-03 MED ORDER — FERROUS SULFATE 325 (65 FE) MG PO TABS
325.0000 mg | ORAL_TABLET | Freq: Every morning | ORAL | Status: DC
  Administered 2024-02-04: 325 mg via ORAL
  Filled 2024-02-03: qty 1

## 2024-02-03 MED ORDER — LEVOTHYROXINE SODIUM 50 MCG PO TABS
50.0000 ug | ORAL_TABLET | Freq: Every day | ORAL | Status: DC
  Administered 2024-02-04: 50 ug via ORAL
  Filled 2024-02-03: qty 1

## 2024-02-03 MED ORDER — INSULIN GLARGINE-YFGN 100 UNIT/ML ~~LOC~~ SOLN
15.0000 [IU] | Freq: Every day | SUBCUTANEOUS | Status: DC
  Administered 2024-02-04: 15 [IU] via SUBCUTANEOUS
  Filled 2024-02-03: qty 0.15

## 2024-02-03 MED ORDER — DOXEPIN HCL 10 MG PO CAPS
10.0000 mg | ORAL_CAPSULE | Freq: Every day | ORAL | Status: DC
Start: 2024-02-03 — End: 2024-02-04
  Administered 2024-02-03 – 2024-02-04 (×2): 10 mg via ORAL
  Filled 2024-02-03 (×2): qty 1

## 2024-02-03 MED ORDER — VITAMIN D 25 MCG (1000 UNIT) PO TABS
5000.0000 [IU] | ORAL_TABLET | Freq: Every day | ORAL | Status: DC
  Administered 2024-02-03: 5000 [IU] via ORAL
  Filled 2024-02-03: qty 5

## 2024-02-03 MED ORDER — ASPIRIN 81 MG PO TBEC
81.0000 mg | DELAYED_RELEASE_TABLET | Freq: Every day | ORAL | Status: DC
  Administered 2024-02-04: 81 mg via ORAL
  Filled 2024-02-03: qty 1

## 2024-02-03 MED ORDER — ACETAMINOPHEN 650 MG RE SUPP: 650.0000 mg | Freq: Four times a day (QID) | RECTAL | Status: DC | PRN

## 2024-02-03 MED ORDER — OXYBUTYNIN CHLORIDE ER 10 MG PO TB24
10.0000 mg | ORAL_TABLET | Freq: Every day | ORAL | Status: DC
  Administered 2024-02-03: 10 mg via ORAL
  Filled 2024-02-03 (×2): qty 1

## 2024-02-03 MED ORDER — INSULIN DEGLUDEC 100 UNIT/ML ~~LOC~~ SOPN: 15.0000 [IU] | PEN_INJECTOR | Freq: Every morning | SUBCUTANEOUS | Status: DC

## 2024-02-03 MED ORDER — FOLIC ACID 1 MG PO TABS
2.0000 mg | ORAL_TABLET | Freq: Every morning | ORAL | Status: DC
  Administered 2024-02-04: 2 mg via ORAL
  Filled 2024-02-03: qty 2

## 2024-02-03 MED ORDER — PRAVASTATIN SODIUM 10 MG PO TABS
20.0000 mg | ORAL_TABLET | Freq: Every day | ORAL | Status: DC
  Administered 2024-02-03: 20 mg via ORAL
  Filled 2024-02-03: qty 2

## 2024-02-03 MED ORDER — LISINOPRIL 5 MG PO TABS
5.0000 mg | ORAL_TABLET | Freq: Every day | ORAL | Status: DC
  Filled 2024-02-03: qty 1

## 2024-02-03 MED ORDER — NITROGLYCERIN 0.4 MG SL SUBL
0.4000 mg | SUBLINGUAL_TABLET | SUBLINGUAL | Status: DC | PRN
  Administered 2024-02-03: 0.4 mg via SUBLINGUAL
  Filled 2024-02-03: qty 1

## 2024-02-03 NOTE — ED Provider Notes (Signed)
 [place] EMERGENCY DEPARTMENT AT [facility] Provider Note   CSN: [id number] Arrival date & time: 02/03/24  1147     Patient presents with: Chest Pain   Brittany Acosta is a 73 y.o. female.   HPI 73 year old female presents today complaining of cough that began last week.  She is also complaining of pain in her chest.  She states that the pain has been present off and on for several days.  She currently is having pain at 3-4 out of 10.  She describes it as pressure.  She has not had similar symptoms in the past.  She reports that she was recently in the hospital for fluid in her chest. Reviewed discharge summary from 01/02/2024.  Patient had pericardial effusion,, paroxysmal A-fib, which resolved and she was back in normal sinus rhythm, multifocal pneumonia treated with antibiotics and discharged on Doxy and Augmentin .  Patient went to rehab and has been home for approximately 2 weeks.    Prior to Admission medications   Medication Sig Start Date End Date Taking? Authorizing Provider  allopurinol  (ZYLOPRIM ) 300 MG tablet Take 1 tablet (300 mg total) by mouth daily. 11/22/23   [name], MD  aspirin  EC 81 MG tablet Take 1 tablet (81 mg total) by mouth daily. Swallow whole. 01/25/20   Lonni Slain, MD  Cholecalciferol  (VITAMIN D3) 125 MCG (5000 UT) CAPS Take 5,000 Units by mouth at bedtime.    [provider]  cyclobenzaprine  (FLEXERIL ) 10 MG tablet Take 1 tablet (10 mg) by mouth at bedtime as needed for muscle spasms. 11/22/23   [name], MD  doxepin  (SINEQUAN ) 10 MG capsule Take 1 capsule (10 mg total) by mouth daily. 11/22/23   [name], MD  ferrous sulfate  325 (65 FE) MG tablet Take 325 mg by mouth in the morning.    [provider]  fluticasone  (FLONASE ) 50 MCG/ACT nasal spray Place 2 sprays into both nostrils daily. 11/28/23   [name], MD  folic acid  (FOLVITE ) 1 MG tablet Take 2 tablets (2 mg total) by mouth in the  morning. 11/22/23   [name], MD  furosemide  (LASIX ) 40 MG tablet Take 1 tablet (40 mg total) by mouth daily. 02/08/23   [name], MD  glucose blood (TRUE METRIX BLOOD GLUCOSE TEST) test strip TEST BLOOD SUGAR EVERY DAY AND AS NEEDED 06/11/23   Webb, Padonda B, FNP  Homeopathic Products (EARACHE DROPS OT) Place 1-2 drops in ear(s) 3 (three) times daily as needed (ear pain.).    [provider]  insulin  degludec (TRESIBA  FLEXTOUCH) 100 UNIT/ML FlexTouch Pen Inject 26 Units into the skin in the morning. 11/22/23   [name], MD  lamoTRIgine  (LAMICTAL ) 200 MG tablet TAKE 1 TABLET (200 MG TOTAL) BY MOUTH DAILY AT 12 NOON. 02/08/23   [name], MD  Lancets [facility] DELICA PLUS [place]) MISC Use as directed to monitor glucose daily and as needed. 06/14/23   Webb, Padonda B, FNP  levothyroxine  (SYNTHROID ) 50 MCG tablet Take 1 tablet (50 mcg total) by mouth daily. 01/26/24   [name], MD  lisinopril  (ZESTRIL ) 5 MG tablet Take 1 tablet (5 mg total) by mouth daily. 02/08/23   [name], MD  magnesium  oxide (MAG-OX) 400 (240 Mg) MG tablet Take 400 mg by mouth in the morning.    [provider]  metFORMIN  (GLUCOPHAGE ) 500 MG tablet Take 2 tablets (1,000 mg total) by mouth every morning AND 1  tablet (500 mg total) every evening. 11/22/23   [name], MD  omeprazole  (PRILOSEC) 20 MG capsule TAKE 1 CAPSULE (20 MG TOTAL) BY MOUTH DAILY. TAKE 30 MINUTES BEFORE A MEAL 02/08/23   [name], MD  ondansetron  (ZOFRAN -ODT) 4 MG disintegrating tablet Take 1 tablet (4 mg total) by mouth every 4 (four) hours as needed for nausea/vomiting 11/18/22   [name], MD  oxybutynin  (DITROPAN -XL) 10 MG 24 hr tablet Take 1 tablet (10 mg total) by mouth at bedtime. 11/22/23   [name], MD  Polyethyl Glycol-Propyl Glycol (SYSTANE ULTRA) 0.4-0.3 % SOLN Place 1-2 drops into both eyes 3 (three) times daily as needed (dry/irritated eyes).    [provider]  potassium chloride  SA (KLOR-CON  M) 20 MEQ tablet Take 1 tablet (20 mEq total) by mouth daily. 11/22/23   [name], MD  pravastatin  (PRAVACHOL ) 20 MG tablet Take 1 tablet (20 mg total) by mouth at bedtime. 11/22/23   [name], MD  sertraline  (ZOLOFT ) 100 MG tablet Take 1 tablet (100 mg total) by mouth at bedtime. 10/30/23   [name], MD  tamsulosin  (FLOMAX ) 0.4 MG CAPS capsule Take 1 capsule (0.4 mg total) by mouth at bedtime. 11/22/23   [name], MD    Allergies: Patient has no known allergies.    Review of Systems  Updated Vital Signs BP (!) 147/52   Pulse 89   Temp 98.2 F (36.8 C) (Oral)   Resp 20   Ht 1.562 m (5' 1.5)   Wt 90.3 kg   SpO2 99%   BMI 36.99 kg/m   Physical Exam Vitals reviewed.  Constitutional:      General: She is not in acute distress.    Appearance: She is well-developed.  HENT:     Head: Normocephalic.  Eyes:     Pupils: Pupils are equal, round, and reactive to light.  Cardiovascular:     Rate and Rhythm: Normal rate and regular rhythm.     Heart sounds: Normal heart sounds.  Pulmonary:     Effort: Pulmonary effort is normal.     Breath sounds: Normal breath sounds.  Abdominal:     General: Bowel sounds are normal.     Palpations: Abdomen is soft.  Musculoskeletal:        General: Normal range of motion.     Cervical back: Normal range of motion.  Skin:    General: Skin is warm and dry.     Capillary Refill: Capillary refill takes less than 2 seconds.  Neurological:     General: No focal deficit present.     Mental Status: She is alert.  Psychiatric:        Mood and Affect: Mood normal.     (all labs ordered are listed, but only abnormal results are displayed) Labs Reviewed  BASIC METABOLIC PANEL WITH GFR - Abnormal; Notable for the following components:      Result Value   Chloride 94 (*)    Glucose, Bld 104 (*)    All other components within normal limits  CBC - Abnormal; Notable for the  following components:   WBC 13.1 (*)    RBC 3.86 (*)    Hemoglobin 10.6 (*)    HCT 32.4 (*)    RDW 17.2 (*)    Platelets 454 (*)    All other components within normal limits  TROPONIN T, HIGH SENSITIVITY - Abnormal; Notable for the following components:  Troponin T High Sensitivity 39 (*)    All other components within normal limits    EKG: EKG Interpretation Date/Time:  Friday February 03 2024 11:53:58 EDT Ventricular Rate:  96 PR Interval:  130 QRS Duration:  136 QT Interval:  396 QTC Calculation: 500 R Axis:   -66  Text Interpretation: Normal sinus rhythm with sinus arrhythmia Right bundle branch block Left anterior fascicular block Bifascicular block Minimal voltage criteria for LVH, may be normal variant ( R in aVL ) Abnormal ECG When compared with ECG of 29-Dec-2023 13:08, Nonspecific T wave abnormality no longer evident in Inferior leads Confirmed by Levander Houston [phone]) on 02/03/2024 11:59:55 AM  Radiology: [facility] Chest 2 View Result Date: 02/03/2024 CLINICAL DATA:  chest pain EXAM: CHEST - 2 VIEW COMPARISON:  12/29/2023 FINDINGS: No focal airspace consolidation, pleural effusion, or pneumothorax. No cardiomegaly. Tortuous aorta with aortic atherosclerosis. No acute fracture or destructive lesions. Multilevel thoracic osteophytosis. Right glenohumeral joint arthroplasty. IMPRESSION: No acute cardiopulmonary abnormality. Electronically Signed   By: Rogelia Myers M.D.   On: 02/03/2024 12:44     Procedures   Medications Ordered in the ED - No data to display  Clinical Course as of 02/03/24 1556  Fri Feb 03, 2024  1554 Troponin T High Sensitivity(!): 36 [DR]    Clinical Course User Index [DR] Levander Houston, MD                                 Medical Decision Making Amount and/or Complexity of Data Reviewed Labs: ordered. Decision-making details documented in ED Course. Radiology: ordered.  Risk OTC drugs. Prescription drug management. Decision regarding  hospitalization.   73 year old female history of pericardial effusion, paroxysmal A-fib, presents today to ED with chest pain and cough.  Cough began last week.  She has not had fever associated with this. Differential diagnosis includes was not limited to infectious etiology, volume overload, medication effect, URI, Patient evaluated with chest x-Kyriakos Babler that does not show any focal infiltrates. COVID, flu, RSV pending Patient with chest pain.  There is some association with her coughing but currently she is having chest pain laying that 4 out of 10 without any coughing.  EKG is significant for new left anterior fascicular block Troponin is slightly elevated at 34. Patient is having ongoing chest pain here.  Plan treatment with nitroglycerin . Pain improved after nitroglycerin  Care discussed with Dr. Soledad who accepts for admission.     Final diagnoses:  None    ED Discharge Orders     None          Levander Houston, MD 02/03/24 1601

## 2024-02-03 NOTE — Progress Notes (Signed)
 Subjective:     Patient ID: Brittany Acosta, female    DOB: June 07, 1950, 73 y.o.   MRN: [id number]  Chief Complaint  Patient presents with   Hospitalization Follow-up    Pt was in hospital due to having fluid on her heart    Discussed the use of AI scribe software for clinical note transcription with the patient, who gave verbal consent to proceed.  History of Present Illness Brittany Acosta is a 73 year old female with pericarditis and pericardial effusion who presents with recurrent cough and shortness of breath. She is accompanied by her husband.  She was hospitalized from September 4th to September 29th for pericarditis and pericardial effusion, during which she underwent a pericardiocentesis and was treated with an amiodarone  drip for atrial fibrillation. She also received antibiotics for suspected pneumonia.  she experienced diarrhea from colchicine  and switched to ibuprofen . Went to rehab for 2 wks  Recently, she has experienced a return of her cough, which has persisted for the past week. The cough is productive, though she is not expectorating a significant amount of phlegm. She notes that this is how her symptoms initially began prior to her hospitalization. She has been experiencing mild shortness of breath but denies fever, chest pain, or leg swelling. She has been using over-the-counter medications, including two bottles of cough syrup and a box of Sudafed, without relief.  During the review of symptoms, she confirms a persistent cough and some shortness of breath. Her husband mentions that she coughs 'all night long.'    Health Maintenance Due  Topic Date Due   Diabetic kidney evaluation - Urine ACR  12/24/2009   Medicare Annual Wellness (AWV)  09/04/2020   OPHTHALMOLOGY EXAM  06/07/2023   FOOT EXAM  12/10/2023    Past Medical History:  Diagnosis Date   Allergic rhinitis, cause unspecified    Arthritis    Bipolar disorder, unspecified (HCC)    depression   Cancer (HCC)     skin cancer on left leg, left breast cancer   Cataract    Chronic diastolic heart failure (HCC)    Depression    GERD (gastroesophageal reflux disease)    Headache    History of gout    haven't had it in several years (02/08/2012)   History of kidney stones    History of wrist fracture    rt wrist   Hypertension    Iron deficiency anemia    Morbid obesity (HCC)    Neuropathy due to secondary diabetes (HCC)    Nontoxic multinodular goiter    Osteoporosis    Personal history of radiation therapy    Pneumonia    PONV (postoperative nausea and vomiting)    Psoriatic arthritis (HCC)    PTSD (post-traumatic stress disorder)    Pure hypercholesterolemia    RBBB    Scleroderma (HCC)    Sleep apnea 10/2012   mild osa-did not need cpap -dr clance   Type II diabetes mellitus (HCC)    Unspecified hypothyroidism     Past Surgical History:  Procedure Laterality Date   ABDOMINAL HYSTERECTOMY  1976   BREAST EXCISIONAL BIOPSY Left    BREAST LUMPECTOMY Left 04/25/2018   BREAST LUMPECTOMY WITH RADIOACTIVE SEED AND SENTINEL LYMPH NODE BIOPSY Left 04/25/2018   Procedure: LEFT BREAST LUMPECTOMY WITH RADIOACTIVE SEED AND SENTINEL LYMPH NODE BIOPSY;  Surgeon: Mikell Katz, MD;  Location: [facility] OR;  Service: General;  Laterality: Left;   BREAST SURGERY     CARDIAC  CATHETERIZATION  2001   sees Dr Peter Jordan   CATARACT EXTRACTION  2014   CATARACT EXTRACTION, BILATERAL  02/2011   epps   CHOLECYSTECTOMY  1985   ESOPHAGOGASTRODUODENOSCOPY (EGD) WITH PROPOFOL  N/A 10/14/2022   Procedure: ESOPHAGOGASTRODUODENOSCOPY (EGD) WITH PROPOFOL ;  Surgeon: Wilhelmenia Aloha Raddle., MD;  Location: [facility] ENDOSCOPY;  Service: Gastroenterology;  Laterality: N/A;   EUS N/A 10/14/2022   Procedure: UPPER ENDOSCOPIC ULTRASOUND (EUS) RADIAL;  Surgeon: Wilhelmenia Aloha Raddle., MD;  Location: [facility];  Service: Gastroenterology;  Laterality: N/A;   EXCISIONAL HEMORRHOIDECTOMY     dr cut out in his office  (02/08/2012)   INCISIONAL BREAST BIOPSY  2000   right   JOINT REPLACEMENT     rt knee   KNEE ARTHROSCOPY  08/2009   right   KNEE ARTHROSCOPY WITH MEDIAL MENISECTOMY     left   Left Cystoscopy   1990   LEFT OOPHORECTOMY  1980   Lithotripsy (L) Kidney  1997   LUMBAR LAMINECTOMY/DECOMPRESSION MICRODISCECTOMY Left 07/01/2016   Procedure: Laminectomy for synovial cyst - left - Lumbar four-five;  Surgeon: Victory Gunnels, MD;  Location: [facility] OR;  Service: Neurosurgery;  Laterality: Left;   PARTIAL MASTECTOMY WITH NEEDLE LOCALIZATION Left 09/01/2012   Procedure: PARTIAL MASTECTOMY WITH NEEDLE LOCALIZATION;  Surgeon: Elon CHRISTELLA Pacini, MD;  Location: [facility] OR;  Service: General;  Laterality: Left;   Percitania stone removed (L) Kidney  1992   PERICARDIOCENTESIS N/A 12/09/2023   Procedure: PERICARDIOCENTESIS;  Surgeon: Wonda Sharper, MD;  Location: [facility] INVASIVE CV LAB;  Service: Cardiovascular;  Laterality: N/A;   REVERSE SHOULDER ARTHROPLASTY Right 02/12/2021   Procedure: REVERSE SHOULDER ARTHROPLASTY;  Surgeon: Dozier Soulier, MD;  Location: [facility] ORS;  Service: Orthopedics;  Laterality: Right;   Right nasal surgery  08/1988   Right sinus removed  08/1989   tooth partial   ROTATOR CUFF REPAIR  2013   right shoulder x 2   SHOULDER ARTHROSCOPY WITH ROTATOR CUFF REPAIR AND SUBACROMIAL DECOMPRESSION Left 08/20/2013   Procedure: SHOULDER ARTHROSCOPY  AND SUBACROMIAL DECOMPRESSION;  Surgeon: Soulier Elsie Dozier, MD;  Location: [facility];  Service: Orthopedics;  Laterality: Left;  Left shoulder arthroscopy, debridement, subacromial decompression, distal clavical resection   THYROIDECTOMY  04/22/2011   Procedure: THYROIDECTOMY;  Surgeon: Vaughan JONETTA Ricker, MD;  Location: [facility] OR;  Service: ENT;  Laterality: N/A;  TOTAL THYROIDECOTMY   TOTAL KNEE ARTHROPLASTY  06/18/2011   Procedure: TOTAL KNEE ARTHROPLASTY;  Surgeon: Dempsey JINNY Sensor, MD;  Location: [facility] OR;  Service: Orthopedics;  Laterality: Left;   DEPUY   TOTAL KNEE ARTHROPLASTY  02/07/2012   Procedure: TOTAL KNEE ARTHROPLASTY;  Surgeon: Dempsey JINNY Sensor, MD;  Location: [facility] OR;  Service: Orthopedics;  Laterality: Right;   TUBAL LIGATION  1972     Current Outpatient Medications:    allopurinol  (ZYLOPRIM ) 300 MG tablet, Take 1 tablet (300 mg total) by mouth daily., Disp: 90 tablet, Rfl: 3   aspirin  EC 81 MG tablet, Take 1 tablet (81 mg total) by mouth daily. Swallow whole., Disp: 90 tablet, Rfl: 3   Cholecalciferol  (VITAMIN D3) 125 MCG (5000 UT) CAPS, Take 5,000 Units by mouth at bedtime., Disp: , Rfl:    cyclobenzaprine  (FLEXERIL ) 10 MG tablet, Take 1 tablet (10 mg) by mouth at bedtime as needed for muscle spasms., Disp: 90 tablet, Rfl: 0   doxepin  (SINEQUAN ) 10 MG capsule, Take 1 capsule (10 mg total) by mouth daily., Disp: 90 capsule, Rfl: 1   ferrous sulfate  325 (65 FE)  MG tablet, Take 325 mg by mouth in the morning., Disp: , Rfl:    fluticasone  (FLONASE ) 50 MCG/ACT nasal spray, Place 2 sprays into both nostrils daily., Disp: 48 g, Rfl: 2   folic acid  (FOLVITE ) 1 MG tablet, Take 2 tablets (2 mg total) by mouth in the morning., Disp: 180 tablet, Rfl: 0   furosemide  (LASIX ) 40 MG tablet, Take 1 tablet (40 mg total) by mouth daily., Disp: 90 tablet, Rfl: 10   glucose blood (TRUE METRIX BLOOD GLUCOSE TEST) test strip, TEST BLOOD SUGAR EVERY DAY AND AS NEEDED, Disp: 200 strip, Rfl: 4   Homeopathic Products (EARACHE DROPS OT), Place 1-2 drops in ear(s) 3 (three) times daily as needed (ear pain.)., Disp: , Rfl:    insulin  degludec (TRESIBA  FLEXTOUCH) 100 UNIT/ML FlexTouch Pen, Inject 26 Units into the skin in the morning., Disp: 30 mL, Rfl: 3   lamoTRIgine  (LAMICTAL ) 200 MG tablet, TAKE 1 TABLET (200 MG TOTAL) BY MOUTH DAILY AT 12 NOON., Disp: 90 tablet, Rfl: 10   Lancets (ONETOUCH DELICA PLUS LANCET33G) MISC, Use as directed to monitor glucose daily and as needed., Disp: 200 each, Rfl: 3   levothyroxine  (SYNTHROID ) 50 MCG tablet, Take 1 tablet  (50 mcg total) by mouth daily., Disp: 90 tablet, Rfl: 3   lisinopril  (ZESTRIL ) 5 MG tablet, Take 1 tablet (5 mg total) by mouth daily., Disp: 90 tablet, Rfl: 10   magnesium  oxide (MAG-OX) 400 (240 Mg) MG tablet, Take 400 mg by mouth in the morning., Disp: , Rfl:    metFORMIN  (GLUCOPHAGE ) 500 MG tablet, Take 2 tablets (1,000 mg total) by mouth every morning AND 1 tablet (500 mg total) every evening., Disp: 270 tablet, Rfl: 0   omeprazole  (PRILOSEC) 20 MG capsule, TAKE 1 CAPSULE (20 MG TOTAL) BY MOUTH DAILY. TAKE 30 MINUTES BEFORE A MEAL, Disp: 90 capsule, Rfl: 10   ondansetron  (ZOFRAN -ODT) 4 MG disintegrating tablet, Take 1 tablet (4 mg total) by mouth every 4 (four) hours as needed for nausea/vomiting, Disp: 30 tablet, Rfl: 3   oxybutynin  (DITROPAN -XL) 10 MG 24 hr tablet, Take 1 tablet (10 mg total) by mouth at bedtime., Disp: 90 tablet, Rfl: 3   Polyethyl Glycol-Propyl Glycol (SYSTANE ULTRA) 0.4-0.3 % SOLN, Place 1-2 drops into both eyes 3 (three) times daily as needed (dry/irritated eyes)., Disp: , Rfl:    potassium chloride  SA (KLOR-CON  M) 20 MEQ tablet, Take 1 tablet (20 mEq total) by mouth daily., Disp: 90 tablet, Rfl: 0   pravastatin  (PRAVACHOL ) 20 MG tablet, Take 1 tablet (20 mg total) by mouth at bedtime., Disp: 90 tablet, Rfl: 2   sertraline  (ZOLOFT ) 100 MG tablet, Take 1 tablet (100 mg total) by mouth at bedtime., Disp: 90 tablet, Rfl: 1   tamsulosin  (FLOMAX ) 0.4 MG CAPS capsule, Take 1 capsule (0.4 mg total) by mouth at bedtime., Disp: 90 capsule, Rfl: 3  No Known Allergies ROS neg/noncontributory except as noted HPI/below      Objective:     BP 122/74 (BP Location: Left Arm, Patient Position: Sitting, Cuff Size: Large)   Pulse 84   Temp (!) 97.5 F (36.4 C) (Temporal)   Ht 5' 1.5 (1.562 m)   Wt 200 lb 4 oz (90.8 kg)   SpO2 90%   BMI 37.22 kg/m  Wt Readings from Last 3 Encounters:  02/03/24 199 lb (90.3 kg)  02/03/24 200 lb 4 oz (90.8 kg)  01/02/24 208 lb 11.2 oz (94.7  kg)    Physical Exam VITALS: SaO2- 90%  GENERAL: Well developed well nourished no acute distress HEAD EYES EARS NOSE THROAT: Normocephalic atraumatic, conjunctiva not injected, sclera nonicteric CARDIAC:irreg rate and rhythm, S1 S2 present , no murmur, dorsalis pedis 1 plus bilaterally NECK: Supple, no thyromegaly, no nodes, no carotid bruits LUNGS: Clear to auscultation bilaterally, no wheezes ABDOMEN: Bowel sounds present, soft, non tender non distended, no hepatosplenomegaly, no masses EXTREMITIES: No edema.  Legs very dry. MUSCULOSKELETAL: No gross abnormalities NEUROLOGICAL: Alert and oriented x3, cranial nerves II through XII intact PSYCHIATRIC: Normal mood, good eye contact  Reviewed hosp records       Assessment & Plan:  Acute cough  Irregular heart rhythm  Pericardial effusion    Assessment and Plan-computer locked up Assessment & Plan Pericarditis and pericardial effusion, status post pericardiocentesis   She was recently hospitalized from September 4th to September 29th for pericarditis and pericardial effusion, treated with pericardiocentesis. Current symptoms of cough and shortness of breath raise concern for recurrence of pericardial effusion or other cardiac issues. Oxygen saturation is at 90%, which is concerning. The EKG machine is broken, preventing in-office confirmation of cardiac status. Immediate evaluation is necessary due to the risk of fluid accumulation and potential cardiac complications. Refer to the emergency room for further evaluation and management. Consider CT scan or echocardiogram to assess for fluid accumulation.   Atrial fibrillation   Atrial fibrillation was treated with an amiodarone  drip during recent hospitalization and resolved.  Thought to be d/t pericardiocentesis. Current symptoms of cough and shortness of breath may indicate recurrence of atrial fibrillation. The EKG machine is broken, preventing in-office confirmation. Immediate  evaluation is necessary due to the potential for rapid deterioration. Refer to the emergency room for EKG and further evaluation. Monitor for signs of rapid heart rate or irregular rhythm.  Cough and possible lower respiratory infection   Cough has recurred over the past week, with no relief from over-the-counter medications. Differential diagnosis includes lower respiratory infection or cardiac-related cough due to atrial fibrillation or pericardial effusion. Sudafed use is contraindicated due to potential cardiac side effects. Refer to the emergency room further evaluation. Discontinue Sudafed use due to potential cardiac side effects.     Return in about 3 weeks (around 02/24/2024) for chronic follow-up.  Jenkins CHRISTELLA Carrel, MD

## 2024-02-03 NOTE — Patient Instructions (Signed)
To ER

## 2024-02-03 NOTE — ED Notes (Signed)
 Called Brittany Acosta at [facility] for transport

## 2024-02-03 NOTE — Consult Note (Addendum)
 Cardiology Consultation   Patient ID: Brittany Acosta MRN: [id number]; DOB: 08/31/1950  Admit date: 02/03/2024 Date of Consult: 02/03/2024  PCP:  [name], MD   [facility] Providers Cardiologist:  Shelda Bruckner, MD        Patient Profile: Brittany Acosta is a 73 y.o. female with a hx of pericarditis with pericardial effusion s/p pericardiocentesis, paroxysmal atrial fib/flutter, hypertension, breast CA previously treated with radiation, OSA, type II diabetes with diabetic neuropathy, hypercholesterolemia, obesity, fibromyalgia, bipolar disorder, GERD, kidney stones, anemia, PTSD, RBBB + LAFB, ?scleroderma who is being seen 02/03/2024 for the evaluation of chest pain at the request of Dr. Christobal.  History of Present Illness:  Ms. Bramble previously saw Dr. Bruckner in outpatient setting for lower extremity edema felt due to venous insufficiency, RBBB/LAFB, and preoperative evaluation. Prior coronary CTA 09/2023 showed CAC of 0.636 (32%ile), minimal nonobstructive CAD in dLAD, small pericardial effusion at that time. Chart says hx of scleroderma but patient doesn't recall this - reports she was remotely checked out for lupus which was negative for this.  She was sent to ED for CP/cough in September. She was subsequently found to have moderate-large pericardial effusion with pericarditis and had prolonged admission 9/4-9/29/25. She required pericardiocentesis and drain placement 9/5, removed 12/12/23. She was treated with NSAIDS/colchicine . During admission she had newly documented episode of paroxysmal atrial fib/flutter felt triggered by pericarditis, converted to NSR on IV amiodarone  and transitioned to oral amiodarone . DOAC was deferred given acute pericarditis. She required prolonged hospitalization for a number of metabolic derangements, possible multifocal pneumonia, nausea, vomiting and diarrhea, the latter of which which may have been related to colchicine  therefore  discontinued. Cardiology was re-engaged 12/29/23 due to dyspnea and chest discomfort. She was still maintaining NSR. Limited echo 12/30/23 showed no recurrent pericardial effusion, EF 60-65%, normal RV, mild MR. Persistent pleural effusions were felt to be playing a role. Her ibuprofen  had been gradually tapered downward. She was discharged with new amiodarone , amlodipine , Augmentin , doxycycline , hydralazine  and ibuprofen  (200mg  TID x 2 more days). She got sporadic dosing of IV Lasix  during admission, last on 9/26.  She went home after SNF a few weeks ago. She returned to the ED this AM with recurrent intermittent left sided chest pain radiating to her back and onset of coughing this week. She has noticed a yellow sputum productive cough without fevers or chills. She has noticed a sensation of vague chest pain particularly exacerbated by coughing. It happens about 5 times a day lasting minutes to hours, at times occurring independent of coughing without otherwise clear trigger. Certain positions aggravate it but these vary and is not clearly worse with recumbency or leaning forward. She is laying back without pain presently. It is not pleuritic. She cannot reproduce it with exertion. Unclear if it can be worsened with palpation. No SOB, edema, syncope, palpitations. WBC elevated at 13.1, Hgb 10.6 with mild thrombocytosis similar to prior, hsTroponin 39->36 at outside facility (previously 29-28 in 12/2023), repeat here negative. Covid neg. CXR NAD. No pleural effusions. Denies complaint at present time. She is maintaining NSR, no longer on amiodarone , states not prescribed at [facility] discharge. O2 sat reported to be 90% at PCP office, thankfully 97-100% here.   Past Medical History:  Diagnosis Date   Allergic rhinitis, cause unspecified    Arthritis    Bipolar disorder, unspecified (HCC)    depression   Cancer (HCC)    skin cancer on left leg, left breast cancer  Cataract    Chronic diastolic heart failure  (HCC)    Depression    GERD (gastroesophageal reflux disease)    Headache    History of gout    haven't had it in several years (02/08/2012)   History of kidney stones    History of wrist fracture    rt wrist   Hypertension    Iron deficiency anemia    Morbid obesity (HCC)    Neuropathy due to secondary diabetes (HCC)    Nontoxic multinodular goiter    Osteoporosis    Personal history of radiation therapy    Pneumonia    PONV (postoperative nausea and vomiting)    Psoriatic arthritis (HCC)    PTSD (post-traumatic stress disorder)    Pure hypercholesterolemia    RBBB    Scleroderma (HCC)    Sleep apnea 10/2012   mild osa-did not need cpap -dr clance   Type II diabetes mellitus (HCC)    Unspecified hypothyroidism     Past Surgical History:  Procedure Laterality Date   ABDOMINAL HYSTERECTOMY  1976   BREAST EXCISIONAL BIOPSY Left    BREAST LUMPECTOMY Left 04/25/2018   BREAST LUMPECTOMY WITH RADIOACTIVE SEED AND SENTINEL LYMPH NODE BIOPSY Left 04/25/2018   Procedure: LEFT BREAST LUMPECTOMY WITH RADIOACTIVE SEED AND SENTINEL LYMPH NODE BIOPSY;  Surgeon: Mikell Katz, MD;  Location: [facility] OR;  Service: General;  Laterality: Left;   BREAST SURGERY     CARDIAC CATHETERIZATION  2001   sees Dr Peter Jordan   CATARACT EXTRACTION  2014   CATARACT EXTRACTION, BILATERAL  02/2011   epps   CHOLECYSTECTOMY  1985   ESOPHAGOGASTRODUODENOSCOPY (EGD) WITH PROPOFOL  N/A 10/14/2022   Procedure: ESOPHAGOGASTRODUODENOSCOPY (EGD) WITH PROPOFOL ;  Surgeon: Wilhelmenia Aloha Raddle., MD;  Location: [facility];  Service: Gastroenterology;  Laterality: N/A;   EUS N/A 10/14/2022   Procedure: UPPER ENDOSCOPIC ULTRASOUND (EUS) RADIAL;  Surgeon: Wilhelmenia Aloha Raddle., MD;  Location: [facility];  Service: Gastroenterology;  Laterality: N/A;   EXCISIONAL HEMORRHOIDECTOMY     dr cut out in his office (02/08/2012)   INCISIONAL BREAST BIOPSY  2000   right   JOINT REPLACEMENT     rt knee   KNEE  ARTHROSCOPY  08/2009   right   KNEE ARTHROSCOPY WITH MEDIAL MENISECTOMY     left   Left Cystoscopy   1990   LEFT OOPHORECTOMY  1980   Lithotripsy (L) Kidney  1997   LUMBAR LAMINECTOMY/DECOMPRESSION MICRODISCECTOMY Left 07/01/2016   Procedure: Laminectomy for synovial cyst - left - Lumbar four-five;  Surgeon: Victory Gunnels, MD;  Location: [facility] OR;  Service: Neurosurgery;  Laterality: Left;   PARTIAL MASTECTOMY WITH NEEDLE LOCALIZATION Left 09/01/2012   Procedure: PARTIAL MASTECTOMY WITH NEEDLE LOCALIZATION;  Surgeon: Elon CHRISTELLA Pacini, MD;  Location: [facility] OR;  Service: General;  Laterality: Left;   Percitania stone removed (L) Kidney  1992   PERICARDIOCENTESIS N/A 12/09/2023   Procedure: PERICARDIOCENTESIS;  Surgeon: Wonda Sharper, MD;  Location: [facility] INVASIVE CV LAB;  Service: Cardiovascular;  Laterality: N/A;   REVERSE SHOULDER ARTHROPLASTY Right 02/12/2021   Procedure: REVERSE SHOULDER ARTHROPLASTY;  Surgeon: Dozier Soulier, MD;  Location: [facility]S;  Service: Orthopedics;  Laterality: Right;   Right nasal surgery  08/1988   Right sinus removed  08/1989   tooth partial   ROTATOR CUFF REPAIR  2013   right shoulder x 2   SHOULDER ARTHROSCOPY WITH ROTATOR CUFF REPAIR AND SUBACROMIAL DECOMPRESSION Left 08/20/2013   Procedure: SHOULDER ARTHROSCOPY  AND SUBACROMIAL DECOMPRESSION;  Surgeon: Eva Elsie Herring, MD;  Location: [facility];  Service: Orthopedics;  Laterality: Left;  Left shoulder arthroscopy, debridement, subacromial decompression, distal clavical resection   THYROIDECTOMY  04/22/2011   Procedure: THYROIDECTOMY;  Surgeon: Vaughan JONETTA Ricker, MD;  Location: [facility] OR;  Service: ENT;  Laterality: N/A;  TOTAL THYROIDECOTMY   TOTAL KNEE ARTHROPLASTY  06/18/2011   Procedure: TOTAL KNEE ARTHROPLASTY;  Surgeon: Dempsey JINNY Sensor, MD;  Location: [facility] OR;  Service: Orthopedics;  Laterality: Left;  DEPUY   TOTAL KNEE ARTHROPLASTY  02/07/2012   Procedure: TOTAL KNEE ARTHROPLASTY;  Surgeon: Dempsey JINNY Sensor, MD;  Location: [facility] OR;  Service: Orthopedics;  Laterality: Right;   TUBAL LIGATION  1972     Home Medications:  Prior to Admission medications   Medication Sig Start Date End Date Taking? Authorizing Provider  allopurinol  (ZYLOPRIM ) 300 MG tablet Take 1 tablet (300 mg total) by mouth daily. 11/22/23   [name], MD  aspirin  EC 81 MG tablet Take 1 tablet (81 mg total) by mouth daily. Swallow whole. 01/25/20   Lonni Slain, MD  Cholecalciferol  (VITAMIN D3) 125 MCG (5000 UT) CAPS Take 5,000 Units by mouth at bedtime.    [provider]  cyclobenzaprine  (FLEXERIL ) 10 MG tablet Take 1 tablet (10 mg) by mouth at bedtime as needed for muscle spasms. 11/22/23   [name], MD  doxepin  (SINEQUAN ) 10 MG capsule Take 1 capsule (10 mg total) by mouth daily. 11/22/23   [name], MD  ferrous sulfate  325 (65 FE) MG tablet Take 325 mg by mouth in the morning.    [provider]  fluticasone  (FLONASE ) 50 MCG/ACT nasal spray Place 2 sprays into both nostrils daily. 11/28/23   [name], MD  folic acid  (FOLVITE ) 1 MG tablet Take 2 tablets (2 mg total) by mouth in the morning. 11/22/23   [name], MD  furosemide  (LASIX ) 40 MG tablet Take 1 tablet (40 mg total) by mouth daily. 02/08/23   [name], MD  glucose blood (TRUE METRIX BLOOD GLUCOSE TEST) test strip TEST BLOOD SUGAR EVERY DAY AND AS NEEDED 06/11/23   Webb, Padonda B, FNP  Homeopathic Products (EARACHE DROPS OT) Place 1-2 drops in ear(s) 3 (three) times daily as needed (ear pain.).    [provider]  insulin  degludec (TRESIBA  FLEXTOUCH) 100 UNIT/ML FlexTouch Pen Inject 26 Units into the skin in the morning. 11/22/23   [name], MD  lamoTRIgine  (LAMICTAL ) 200 MG tablet TAKE 1 TABLET (200 MG TOTAL) BY MOUTH DAILY AT 12 NOON. 02/08/23   [name], MD  Lancets [facility] DELICA PLUS [place]) MISC Use as directed to monitor glucose daily and as needed. 06/14/23   Webb,  Padonda B, FNP  levothyroxine  (SYNTHROID ) 50 MCG tablet Take 1 tablet (50 mcg total) by mouth daily. 01/26/24   [name], MD  lisinopril  (ZESTRIL ) 5 MG tablet Take 1 tablet (5 mg total) by mouth daily. 02/08/23   [name], MD  magnesium  oxide (MAG-OX) 400 (240 Mg) MG tablet Take 400 mg by mouth in the morning.    [provider]  metFORMIN  (GLUCOPHAGE ) 500 MG tablet Take 2 tablets (1,000 mg total) by mouth every morning AND 1 tablet (500 mg total) every evening. 11/22/23   [name], MD  omeprazole  (PRILOSEC) 20 MG capsule TAKE 1 CAPSULE (20 MG TOTAL) BY MOUTH DAILY. TAKE 30 MINUTES BEFORE A MEAL 02/08/23   [name],  MD  ondansetron  (ZOFRAN -ODT) 4 MG disintegrating tablet Take 1 tablet (4 mg total) by mouth every 4 (four) hours as needed for nausea/vomiting 11/18/22   [name], MD  oxybutynin  (DITROPAN -XL) 10 MG 24 hr tablet Take 1 tablet (10 mg total) by mouth at bedtime. 11/22/23   [name], MD  Polyethyl Glycol-Propyl Glycol (SYSTANE ULTRA) 0.4-0.3 % SOLN Place 1-2 drops into both eyes 3 (three) times daily as needed (dry/irritated eyes).    [provider]  potassium chloride  SA (KLOR-CON  M) 20 MEQ tablet Take 1 tablet (20 mEq total) by mouth daily. 11/22/23   [name], MD  pravastatin  (PRAVACHOL ) 20 MG tablet Take 1 tablet (20 mg total) by mouth at bedtime. 11/22/23   [name], MD  sertraline  (ZOLOFT ) 100 MG tablet Take 1 tablet (100 mg total) by mouth at bedtime. 10/30/23   [name], MD  tamsulosin  (FLOMAX ) 0.4 MG CAPS capsule Take 1 capsule (0.4 mg total) by mouth at bedtime. 11/22/23   [name], MD    Scheduled Meds:  allopurinol   300 mg Oral Daily   [START ON 02/04/2024] aspirin  EC  81 mg Oral Daily   cholecalciferol   5,000 Units Oral QHS   doxepin   10 mg Oral Daily   [START ON 02/04/2024] ferrous sulfate   325 mg Oral q AM   [START ON 02/04/2024] fluticasone   2 spray Each Nare Daily   [START ON  02/04/2024] folic acid   2 mg Oral q AM   [START ON 02/04/2024] furosemide   40 mg Oral Daily   insulin  aspart  0-5 Units Subcutaneous QHS   [START ON 02/04/2024] insulin  aspart  0-9 Units Subcutaneous TID WC   [START ON 02/04/2024] insulin  glargine-yfgn  15 Units Subcutaneous Daily   [START ON 02/04/2024] lamoTRIgine   200 mg Oral Q1200   [START ON 02/04/2024] levothyroxine   50 mcg Oral Daily   [START ON 02/04/2024] lisinopril   5 mg Oral Daily   oxybutynin   10 mg Oral QHS   [START ON 02/04/2024] pantoprazole   40 mg Oral Daily   pravastatin   20 mg Oral QHS   sertraline   100 mg Oral QHS   tamsulosin   0.4 mg Oral QHS   Continuous Infusions:  PRN Meds: acetaminophen  **OR** acetaminophen , cyclobenzaprine , nitroGLYCERIN , ondansetron  **OR** ondansetron  (ZOFRAN ) IV  Allergies:   No Known Allergies  Social History:   Social History   Socioeconomic History   Marital status: Married    Spouse name: Not on file   Number of children: 2   Years of education: 14   Highest education level: Not on file  Occupational History   Occupation: disability    Employer: RETIRED  Tobacco Use   Smoking status: Former    Current packs/day: 0.00    Average packs/day: 1 pack/day for 6.0 years (6.0 ttl pk-yrs)    Types: Cigarettes    Start date: 04/05/1977    Quit date: 04/06/1983    Years since quitting: 40.8   Smokeless tobacco: Never   Tobacco comments:    Married, lives with spouse. Disable- 2 grown kids-6 g-kids  Vaping Use   Vaping status: Never Used  Substance and Sexual Activity   Alcohol use: No    Comment: none   Drug use: No   Sexual activity: Not Currently  Other Topics Concern   Not on file  Social History Narrative   Married, lives with spouse - 2 adult children   Disabled -- started around 2000 for mental health  reasons, Bipolar Disorder/PTSD   Husband works   Teacher, Early Years/pre Strain: Low Risk  (09/05/2019)   Received from [facility]   Overall Financial  Resource Strain (CARDIA)    Difficulty of Paying Living Expenses: Not very hard  Food Insecurity: No Food Insecurity (12/08/2023)   Hunger Vital Sign    Worried About Running Out of Food in the Last Year: Never true    Ran Out of Food in the Last Year: Never true  Transportation Needs: No Transportation Needs (12/08/2023)   PRAPARE - Administrator, Civil Service (Medical): No    Lack of Transportation (Non-Medical): No  Physical Activity: Unknown (09/05/2019)   Received from [facility]   Exercise Vital Sign    On average, how many days per week do you engage in moderate to strenuous exercise (like a brisk walk)?: 0 days    Minutes of Exercise per Session: Not on file  Stress: No Stress Concern Present (09/05/2019)   Received from [facility] - Occupational Stress Questionnaire    Feeling of Stress : Only a little  Social Connections: Moderately Isolated (12/08/2023)   Social Connection and Isolation Panel    Frequency of Communication with Friends and Family: More than three times a week    Frequency of Social Gatherings with Friends and Family: Once a week    Attends Religious Services: Never    Database Administrator or Organizations: No    Attends Banker Meetings: Never    Marital Status: Married  Catering Manager Violence: Not At Risk (12/08/2023)   Humiliation, Afraid, Rape, and Kick questionnaire    Fear of Current or Ex-Partner: No    Emotionally Abused: No    Physically Abused: No    Sexually Abused: No    Family History:    Family History  Problem Relation Age of Onset   Hypertension Mother    Lung cancer Mother        non-smoker   Diabetes Father    Hypertension Father    Hyperlipidemia Father    Hypertension Sister    Hypertension Sister    Hypertension Sister    Hypertension Brother    Hypertension Brother    Hypertension Brother    Stomach cancer Maternal Grandmother    Diabetes Daughter     Heart attack Other    Coronary artery disease Other    Colon cancer Neg Hx        unsure   Esophageal cancer Neg Hx    Liver cancer Neg Hx    Pancreatic cancer Neg Hx    Rectal cancer Neg Hx    Colon polyps Neg Hx      ROS:  Please see the history of present illness.  All other ROS reviewed and negative.     Physical Exam/Data: Vitals:   02/03/24 1521 02/03/24 1554 02/03/24 1615 02/03/24 1722  BP: 132/70  (!) 143/59 (!) 151/61  Pulse: 87  81 79  Resp: (!) 22  20 20   Temp:  98.1 F (36.7 C)  98.4 F (36.9 C)  TempSrc:  Oral  Axillary  SpO2: 100%  99% 99%  Weight:      Height:       No intake or output data in the 24 hours ending 02/03/24 1814    02/03/2024   11:54 AM 02/03/2024   10:13 AM 01/02/2024    6:16 AM  Last 3 Weights  Weight (lbs) 199 lb 200 lb 4 oz 208 lb 11.2 oz  Weight (kg) 90.266 kg 90.833 kg 94.666 kg     Body mass index is 36.99 kg/m.  General: Well developed, well nourished WF, in no acute distress. Head: Normocephalic, atraumatic, sclera non-icteric, no xanthomas, nares are without discharge. Neck: Negative for carotid bruits. JVP not elevated. Lungs: Clear bilaterally to auscultation without wheezes, rales, or rhonchi. Breathing is unlabored. Heart: RRR S1 S2 without murmurs, rubs, or gallops.  Abdomen: Soft, non-tender, non-distended with normoactive bowel sounds. No rebound/guarding. Extremities: No clubbing or cyanosis. No edema. Distal pedal pulses are 2+ and equal bilaterally. Neuro: Alert and oriented X 3. Moves all extremities spontaneously. Psych:  Responds to questions appropriately with a normal affect.   EKG:  The EKG was personally reviewed and demonstrates:  NSR 96bpm, RBBB, LAFB, occasional PAC, nonspecific STTW changes  Telemetry:  Telemetry was personally reviewed and demonstrates:  NSR, occ PAC  Relevant CV Studies: See EMR for numerous echoes  Laboratory Data: High Sensitivity Troponin:  No results for input(s):  TROPONINIHS in the last 720 hours.   Chemistry Recent Labs  Lab 02/03/24 1210  NA 135  K 4.4  CL 94*  CO2 26  GLUCOSE 104*  BUN 13  CREATININE 0.68  CALCIUM  10.2  GFRNONAA >60  ANIONGAP 14    No results for input(s): PROT, ALBUMIN , AST, ALT, ALKPHOS, BILITOT in the last 168 hours. Lipids No results for input(s): CHOL, TRIG, HDL, LABVLDL, LDLCALC, CHOLHDL in the last 168 hours.  Hematology Recent Labs  Lab 02/03/24 1210  WBC 13.1*  RBC 3.86*  HGB 10.6*  HCT 32.4*  MCV 83.9  MCH 27.5  MCHC 32.7  RDW 17.2*  PLT 454*   Thyroid  No results for input(s): TSH, FREET4 in the last 168 hours.  BNPNo results for input(s): BNP, PROBNP in the last 168 hours.  DDimer No results for input(s): DDIMER in the last 168 hours.  Radiology/Studies:  DG Chest 2 View Result Date: 02/03/2024 CLINICAL DATA:  chest pain EXAM: CHEST - 2 VIEW COMPARISON:  12/29/2023 FINDINGS: No focal airspace consolidation, pleural effusion, or pneumothorax. No cardiomegaly. Tortuous aorta with aortic atherosclerosis. No acute fracture or destructive lesions. Multilevel thoracic osteophytosis. Right glenohumeral joint arthroplasty. IMPRESSION: No acute cardiopulmonary abnormality. Electronically Signed   By: Rogelia Myers M.D.   On: 02/03/2024 12:44     Assessment and Plan:  1. Atypical chest pain, marginal troponin elevation 2. Recent pericarditis s/p pericardiocentesis with drain 3. ? URI symptoms - productive cough with yellow sputum, leukocytosis - chest pain is somewhat atypical, not like prior pericarditis per patient, associated with recurrent URI symptoms, at times with coughing - hsTroponins low/flat at outside [facility], but here completely normal x1 - EKG shows NSR with known RBBB/LAFB - will add ESR/CRP to labs and await echo - note CXR shows no cardiomegaly or pleural effusions this admission - may need to consider CMRI if able to do over weekend - reasonable  to trial NSAIDS if this continues to be an issue but will be seen by MD with further recs - defer URI eval/mgmt to primary team - if hypoxia recurs, consider eval for PE  4. Recent transient afib/flutter in setting of #2 without clear recurrence - no longer on amiodarone , OK to hold off and follow rhythm for now - not on DOAC given recent transient occurrence in setting of #2 - follow on telemetry  5. RBBB + LAFB -  stable  6. Recently abnormal TSH - TSH 6.8 in 12/2023 - repeat in AM with free T4  7. HTN - per primary team  Risk Assessment/Risk Scores:         CHA2DS2-VASc Score = 5   This indicates a 7.2% annual risk of stroke. The patient's score is based upon: CHF History: 0 HTN History: 1 Diabetes History: 1 Stroke History: 0 Vascular Disease History: 1 Age Score: 1 Gender Score: 1      For questions or updates, please contact [facility] Please consult [email or web address] for contact info under      Signed, Raphael LOISE Bring, PA-C  02/03/2024 6:14 PM

## 2024-02-03 NOTE — Hospital Course (Addendum)
 Brittany Acosta is a 73 y.o. female with PMH of recent acute idiopathic pericarditis/pericardial effusion needing pericardiocentesis, echo subsequently showing resolution of effusion, paroxysmal A-fib, hyponatremia, hypothyroidism diabetes, hypertension,, hyperlipidemia who presented with chest pain present on and off for as week, described as pressure sensation, mostly at rest and at night, non exertional, with associated productive cough. Pain felt like similar symptoms in the past when she was recently hospitalized and discharged 01/02/2024-with pericardial effusion -had pericardiocentesis , new onset A-fib along with multifocal pneumonia and discharged to rehab for 2 weeks.  [facility]: Vitals fairly stable troponin 36>39  COVID flu RSV  negative. CXR-did not show any focal infiltrates EKG with new left anterior fascicular block given ongoing chest pain transferred to [facility].  Rest of the labs with mild leukocytosis which is downtrending at 13 from recent 14.7, hemoglobin 10.6 stable. Patient was admitted seen by cardiology Cardizem started with improvement in the chest pain. She is cleared for discharge home by cardiology  Subjective: Seen and examined this morning Chest pain improved after Cardizem Having some cough and lisinopril  discontinued Overnight afebrile on room air BP stable, labs overall unremarkable except for mild hypokalemia CRP 0.8 troponin has been -7, 8 TSH 4.5 free T4- 1.44  Discharge Diagnoses:   Chest pain Elevated troponin mild flat: Presented with substernal chest pain nonradiating at rest EKG NSR, RBBB, initial trops in 30s, but repeat troponin negativex2 had CT cardiac in June 2025,NM PET/CT in May 2025. Chest x-ray no acute infectious finding Recent pericarditis back in September, but she has been normal ESR about the same and 35, cardiology input appreciated  Started on Cardizem, continue home aspirin  81, statin Seen by cardiology this morning  okay for discharge home and they have arranged outpatient follow-up  Cough: ??  Drug-induced.  On lisinopril  chronically, chest x-ray unremarkable.  PAF: Transient/one-time occurrence in the setting of pericarditis on last admission not on DOAC.  Monitor  Recent pericarditis/pericardial effusion needing pericardiocentesis in September Chest x-ray unremarkable.  Hypothyroidism TSH of 4.5 and free T4 UP AT 1.4- will decrease Synthroid  50 to 37.5 mcg as a month ago TSH was 6.8 free T41.3-needs repeat in 3 weeks to adjust  Hypercholesterolemia Continue pravastatin   Bipolar 1 disorder Fibromyalgia: Continue home meds  Hypertension BP fairly controlled.  Continue lisinopril , on CCB as above  GERD: Continue PPI  Diabetes mellitus on long-term insulin /metformin : Resume insulin  regimen, SSI hold p.o. 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  DVT prophylaxis: SCDs Start: 02/03/24 1805 Code Status:   Code Status: Full Code Family Communication: plan of care discussed with patient at bedside. Patient status is: Remains hospitalized because of severity of illness Level of care: Telemetry   Dispo: The patient is from: home            Anticipated disposition:  home today Objective: Vitals last 24 hrs: Vitals:   02/03/24 1722 02/03/24 1928 02/04/24 0402 02/04/24 0806  BP: (!) 151/61 (!) 163/60 (!) 141/69 (!) 146/57  Pulse: 79 92 86 81  Resp: 20 20 18 17   Temp: 98.4 F (36.9 C) 97.6 F (36.4 C) 98.2 F (36.8 C) 98.1 F (36.7 C)  TempSrc: Axillary Oral Oral Oral  SpO2: 99% 100% 96% 96%  Weight:      Height:  Physical Examination: General exam: [id number] HEENT:Oral mucosa moist, Ear/Nose WNL grossly Respiratory system: Bilaterally clear BS,no use of accessory muscle Cardiovascular system: S1 & S2 +, No  JVD. Gastrointestinal system: Abdomen soft,NT,ND, BS+ Nervous System: Alert, awake, moving all extremities,and following commands. Extremities: extremities warm, leg edema neg Skin: Warm, no rashes MSK: Normal muscle bulk,tone, power

## 2024-02-03 NOTE — H&P (Signed)
 History and Physical    Brittany Acosta FMW:[id number] DOB: Dec 20, 1950 DOA: 02/03/2024  PCP: [name], MD   Patient coming from:  PCP to ED and to [facility] as direct admit  Chief Complaint  Patient presents with   Chest Pain   Brittany Acosta is a 73 y.o. female with PMH of recent acute idiopathic pericarditis/pericardial effusion needing pericardiocentesis, echo subsequently showing resolution of effusion, paroxysmal A-fib, hyponatremia, hypothyroidism diabetes, hypertension,, hyperlipidemia who presented with chest pain present on and off for as week, described as pressure sensation, mostly at rest and at night, non exertional, with associated productive cough. Pain felt like similar symptoms in the past when she was recently hospitalized and discharged 01/02/2024-with pericardial effusion -had pericardiocentesis , new onset A-fib along with multifocal pneumonia and discharged to rehab for 2 weeks.  She was sent to [facility]: Vitals fairly stable troponin 36>39  COVID flu RSV  negative. CXR-did not show any focal infiltrates EKG with new left anterior fascicular block given ongoing chest pain transferred to [facility]  Rest of the labs with mild leukocytosis which is downtrending at 13 from recent 14.7, hemoglobin 10.6 stable.  Currently on my eval on floor seh has no chest  pain and is comfortable She denies any nausea, vomiting,shortness of breath, fever, chills, focal weakness, numbness tingling, speech difficulties. No calf or leg swelling Has mild headaches  Assessment and plan:  Chest pain in adult Elevated troponin mild flat EKG changes with left anterior fascicular block: Given patient's chest pain transferred to [facility] for further evaluation, check serial troponin.  Will consult cardiology  Continue symptomatic management.  Will obtain echocardiogram given recent pericardial effusion.  Patient pericarditis/pericardial effusion needing pericardiocentesis in  September Chest x-ray unremarkable.  Recent PAF: Resume amiodarone  recently discharged without anticoagulation given recent pericardiocentesis  Hypothyroidism cont Synthroid   Hypercholesterolemia Continue pravastatin   Bipolar 1 disorder Fibromyalgia  Hypertension BP fairly controlled.  Resume lisinopril   GERD: Continue PPI  Diabetes mellitus on long-term insulin /metformin : Resume insulin  regimen, SSI hold p.o. meds  Class II Obesity w/ Body mass index is 36.99 kg/m.: Will benefit with PCP follow-up, weight loss,healthy lifestyle and outpatient sleep eval if not done.  DVT prophylaxis:  Code Status:   Code Status: Prior Family Communication: plan of care discussed with patient at bedside. Patient status is: Remains hospitalized because of severity of illness Level of care: Telemetry   Dispo: The patient is from: home            Anticipated disposition: TBD Objective: Vitals last 24 hrs: Vitals:   02/03/24 1521 02/03/24 1554 02/03/24 1615 02/03/24 1722  BP: 132/70  (!) 143/59 (!) 151/61  Pulse: 87  81 79  Resp: (!) 22  20 20   Temp:  98.1 F (36.7 C)  98.4 F (36.9 C)  TempSrc:  Oral  Axillary  SpO2: 100%  99% 99%  Weight:      Height:        Physical Examination: General exam: alert awake, oriented, older than stated age HEENT:Oral mucosa moist, Ear/Nose WNL grossly Respiratory system: Bilaterally clear BS,no use of accessory muscle Cardiovascular system: S1 & S2 +, No JVD. Gastrointestinal system: Abdomen soft,NT,ND, BS+ Nervous System: Alert, awake, moving all extremities,and following commands. Extremities: extremities warm, leg edema neg Skin: Warm, no rashes MSK: Normal muscle bulk,tone, power   Medications reviewed:  Scheduled Meds:  allopurinol   300 mg Oral Daily   [START ON 02/04/2024] aspirin  EC  81 mg Oral  Daily   cholecalciferol   5,000 Units Oral QHS   doxepin   10 mg Oral Daily   [START ON 02/04/2024] ferrous sulfate   325 mg Oral q AM    [START ON 02/04/2024] fluticasone   2 spray Each Nare Daily   [START ON 02/04/2024] folic acid   2 mg Oral q AM   [START ON 02/04/2024] furosemide   40 mg Oral Daily   insulin  aspart  0-5 Units Subcutaneous QHS   [START ON 02/04/2024] insulin  aspart  0-9 Units Subcutaneous TID WC   [START ON 02/04/2024] insulin  degludec  15 Units Subcutaneous q AM   [START ON 02/04/2024] lamoTRIgine   200 mg Oral Q1200   levothyroxine   50 mcg Oral Daily   [START ON 02/04/2024] lisinopril   5 mg Oral Daily   oxybutynin   10 mg Oral QHS   pantoprazole   40 mg Oral Daily   pravastatin   20 mg Oral QHS   sertraline   100 mg Oral QHS   tamsulosin   0.4 mg Oral QHS   Continuous Infusions: Diet: Diet Order             Diet Carb Modified Fluid consistency: Thin; Room service appropriate? Yes  Diet effective now                     Severity of Illness: The appropriate patient status for this patient is OBSERVATION. Observation status is judged to be reasonable and necessary in order to provide the required intensity of service to ensure the patient's safety. The patient's presenting symptoms, physical exam findings, and initial radiographic and laboratory data in the context of their medical condition is felt to place them at decreased risk for further clinical deterioration. Furthermore, it is anticipated that the patient will be medically stable for discharge from the hospital within 2 midnights of admission.   Family Communication: Admission, patients condition and plan of care including tests being ordered have been discussed with the patient a who indicate understanding and agree with the plan and Code Status.  Consults called:  cardiology  Review of Systems: All systems were reviewed and were negative except as mentioned in HPI above. Negative for fever Negative for chest pain Negative for shortness of breath  Past Medical History:  Diagnosis Date   Allergic rhinitis, cause unspecified    Arthritis     Bipolar disorder, unspecified (HCC)    depression   Cancer (HCC)    skin cancer on left leg, left breast cancer   Cataract    Chronic diastolic heart failure (HCC)    Depression    GERD (gastroesophageal reflux disease)    Headache    History of gout    haven't had it in several years (02/08/2012)   History of kidney stones    History of wrist fracture    rt wrist   Hypertension    Iron deficiency anemia    Morbid obesity (HCC)    Neuropathy due to secondary diabetes (HCC)    Nontoxic multinodular goiter    Osteoporosis    Personal history of radiation therapy    Pneumonia    PONV (postoperative nausea and vomiting)    Psoriatic arthritis (HCC)    PTSD (post-traumatic stress disorder)    Pure hypercholesterolemia    RBBB    Scleroderma (HCC)    Sleep apnea 10/2012   mild osa-did not need cpap -dr clance   Type II diabetes mellitus (HCC)    Unspecified hypothyroidism     Past Surgical History:  Procedure Laterality Date   ABDOMINAL HYSTERECTOMY  1976   BREAST EXCISIONAL BIOPSY Left    BREAST LUMPECTOMY Left 04/25/2018   BREAST LUMPECTOMY WITH RADIOACTIVE SEED AND SENTINEL LYMPH NODE BIOPSY Left 04/25/2018   Procedure: LEFT BREAST LUMPECTOMY WITH RADIOACTIVE SEED AND SENTINEL LYMPH NODE BIOPSY;  Surgeon: Mikell Katz, MD;  Location: [facility] OR;  Service: General;  Laterality: Left;   BREAST SURGERY     CARDIAC CATHETERIZATION  2001   sees Dr Peter Jordan   CATARACT EXTRACTION  2014   CATARACT EXTRACTION, BILATERAL  02/2011   epps   CHOLECYSTECTOMY  1985   ESOPHAGOGASTRODUODENOSCOPY (EGD) WITH PROPOFOL  N/A 10/14/2022   Procedure: ESOPHAGOGASTRODUODENOSCOPY (EGD) WITH PROPOFOL ;  Surgeon: Wilhelmenia Aloha Raddle., MD;  Location: [facility];  Service: Gastroenterology;  Laterality: N/A;   EUS N/A 10/14/2022   Procedure: UPPER ENDOSCOPIC ULTRASOUND (EUS) RADIAL;  Surgeon: Wilhelmenia Aloha Raddle., MD;  Location: [facility];  Service: Gastroenterology;  Laterality: N/A;    EXCISIONAL HEMORRHOIDECTOMY     dr cut out in his office (02/08/2012)   INCISIONAL BREAST BIOPSY  2000   right   JOINT REPLACEMENT     rt knee   KNEE ARTHROSCOPY  08/2009   right   KNEE ARTHROSCOPY WITH MEDIAL MENISECTOMY     left   Left Cystoscopy   1990   LEFT OOPHORECTOMY  1980   Lithotripsy (L) Kidney  1997   LUMBAR LAMINECTOMY/DECOMPRESSION MICRODISCECTOMY Left 07/01/2016   Procedure: Laminectomy for synovial cyst - left - Lumbar four-five;  Surgeon: Victory Gunnels, MD;  Location: [facility] OR;  Service: Neurosurgery;  Laterality: Left;   PARTIAL MASTECTOMY WITH NEEDLE LOCALIZATION Left 09/01/2012   Procedure: PARTIAL MASTECTOMY WITH NEEDLE LOCALIZATION;  Surgeon: Elon CHRISTELLA Pacini, MD;  Location: [facility] OR;  Service: General;  Laterality: Left;   Percitania stone removed (L) Kidney  1992   PERICARDIOCENTESIS N/A 12/09/2023   Procedure: PERICARDIOCENTESIS;  Surgeon: Wonda Sharper, MD;  Location: [facility] INVASIVE CV LAB;  Service: Cardiovascular;  Laterality: N/A;   REVERSE SHOULDER ARTHROPLASTY Right 02/12/2021   Procedure: REVERSE SHOULDER ARTHROPLASTY;  Surgeon: Dozier Soulier, MD;  Location: [facility];  Service: Orthopedics;  Laterality: Right;   Right nasal surgery  08/1988   Right sinus removed  08/1989   tooth partial   ROTATOR CUFF REPAIR  2013   right shoulder x 2   SHOULDER ARTHROSCOPY WITH ROTATOR CUFF REPAIR AND SUBACROMIAL DECOMPRESSION Left 08/20/2013   Procedure: SHOULDER ARTHROSCOPY  AND SUBACROMIAL DECOMPRESSION;  Surgeon: Soulier Elsie Dozier, MD;  Location: [facility];  Service: Orthopedics;  Laterality: Left;  Left shoulder arthroscopy, debridement, subacromial decompression, distal clavical resection   THYROIDECTOMY  04/22/2011   Procedure: THYROIDECTOMY;  Surgeon: Vaughan JONETTA Ricker, MD;  Location: [facility] OR;  Service: ENT;  Laterality: N/A;  TOTAL THYROIDECOTMY   TOTAL KNEE ARTHROPLASTY  06/18/2011   Procedure: TOTAL KNEE ARTHROPLASTY;  Surgeon: Dempsey JINNY Sensor, MD;   Location: [facility] OR;  Service: Orthopedics;  Laterality: Left;  DEPUY   TOTAL KNEE ARTHROPLASTY  02/07/2012   Procedure: TOTAL KNEE ARTHROPLASTY;  Surgeon: Dempsey JINNY Sensor, MD;  Location: [facility] OR;  Service: Orthopedics;  Laterality: Right;   TUBAL LIGATION  1972     reports that she quit smoking about 40 years ago. Her smoking use included cigarettes. She started smoking about 46 years ago. She has a 6 pack-year smoking history. She has never used smokeless tobacco. She reports that she does not drink alcohol and does not use drugs.  No  Known Allergies  Family History  Problem Relation Age of Onset   Hypertension Mother    Lung cancer Mother        non-smoker   Diabetes Father    Hypertension Father    Hyperlipidemia Father    Hypertension Sister    Hypertension Sister    Hypertension Sister    Hypertension Brother    Hypertension Brother    Hypertension Brother    Stomach cancer Maternal Grandmother    Diabetes Daughter    Heart attack Other    Coronary artery disease Other    Colon cancer Neg Hx        unsure   Esophageal cancer Neg Hx    Liver cancer Neg Hx    Pancreatic cancer Neg Hx    Rectal cancer Neg Hx    Colon polyps Neg Hx      Prior to Admission medications   Medication Sig Start Date End Date Taking? Authorizing Provider  allopurinol  (ZYLOPRIM ) 300 MG tablet Take 1 tablet (300 mg total) by mouth daily. 11/22/23   [name], MD  aspirin  EC 81 MG tablet Take 1 tablet (81 mg total) by mouth daily. Swallow whole. 01/25/20   Lonni Slain, MD  Cholecalciferol  (VITAMIN D3) 125 MCG (5000 UT) CAPS Take 5,000 Units by mouth at bedtime.    [provider]  cyclobenzaprine  (FLEXERIL ) 10 MG tablet Take 1 tablet (10 mg) by mouth at bedtime as needed for muscle spasms. 11/22/23   [name], MD  doxepin  (SINEQUAN ) 10 MG capsule Take 1 capsule (10 mg total) by mouth daily. 11/22/23   [name], MD  ferrous sulfate  325 (65 FE) MG tablet Take 325 mg  by mouth in the morning.    [provider]  fluticasone  (FLONASE ) 50 MCG/ACT nasal spray Place 2 sprays into both nostrils daily. 11/28/23   [name], MD  folic acid  (FOLVITE ) 1 MG tablet Take 2 tablets (2 mg total) by mouth in the morning. 11/22/23   [name], MD  furosemide  (LASIX ) 40 MG tablet Take 1 tablet (40 mg total) by mouth daily. 02/08/23   [name], MD  glucose blood (TRUE METRIX BLOOD GLUCOSE TEST) test strip TEST BLOOD SUGAR EVERY DAY AND AS NEEDED 06/11/23   Webb, Padonda B, FNP  Homeopathic Products (EARACHE DROPS OT) Place 1-2 drops in ear(s) 3 (three) times daily as needed (ear pain.).    [provider]  insulin  degludec (TRESIBA  FLEXTOUCH) 100 UNIT/ML FlexTouch Pen Inject 26 Units into the skin in the morning. 11/22/23   [name], MD  lamoTRIgine  (LAMICTAL ) 200 MG tablet TAKE 1 TABLET (200 MG TOTAL) BY MOUTH DAILY AT 12 NOON. 02/08/23   [name], MD  Lancets [facility] DELICA PLUS [place]) MISC Use as directed to monitor glucose daily and as needed. 06/14/23   Webb, Padonda B, FNP  levothyroxine  (SYNTHROID ) 50 MCG tablet Take 1 tablet (50 mcg total) by mouth daily. 01/26/24   [name], MD  lisinopril  (ZESTRIL ) 5 MG tablet Take 1 tablet (5 mg total) by mouth daily. 02/08/23   [name], MD  magnesium  oxide (MAG-OX) 400 (240 Mg) MG tablet Take 400 mg by mouth in the morning.    [provider]  metFORMIN  (GLUCOPHAGE ) 500 MG tablet Take 2 tablets (1,000 mg total) by mouth every morning AND 1 tablet (500 mg total) every evening. 11/22/23   [name], MD  omeprazole  (PRILOSEC) 20  MG capsule TAKE 1 CAPSULE (20 MG TOTAL) BY MOUTH DAILY. TAKE 30 MINUTES BEFORE A MEAL 02/08/23   [name], MD  ondansetron  (ZOFRAN -ODT) 4 MG disintegrating tablet Take 1 tablet (4 mg total) by mouth every 4 (four) hours as needed for nausea/vomiting 11/18/22   [name], MD  oxybutynin  (DITROPAN -XL) 10 MG 24 hr tablet  Take 1 tablet (10 mg total) by mouth at bedtime. 11/22/23   [name], MD  Polyethyl Glycol-Propyl Glycol (SYSTANE ULTRA) 0.4-0.3 % SOLN Place 1-2 drops into both eyes 3 (three) times daily as needed (dry/irritated eyes).    [provider]  potassium chloride  SA (KLOR-CON  M) 20 MEQ tablet Take 1 tablet (20 mEq total) by mouth daily. 11/22/23   [name], MD  pravastatin  (PRAVACHOL ) 20 MG tablet Take 1 tablet (20 mg total) by mouth at bedtime. 11/22/23   [name], MD  sertraline  (ZOLOFT ) 100 MG tablet Take 1 tablet (100 mg total) by mouth at bedtime. 10/30/23   [name], MD  tamsulosin  (FLOMAX ) 0.4 MG CAPS capsule Take 1 capsule (0.4 mg total) by mouth at bedtime. 11/22/23   [name], MD   r   Labs on Admission: I have personally reviewed following labs and imaging studies  CBC: Recent Labs  Lab 02/03/24 1210  WBC 13.1*  HGB 10.6*  HCT 32.4*  MCV 83.9  PLT 454*   Basic Metabolic Panel: Recent Labs  Lab 02/03/24 1210  NA 135  K 4.4  CL 94*  CO2 26  GLUCOSE 104*  BUN 13  CREATININE 0.68  CALCIUM  10.2   Estimated Creatinine Clearance: 64.8 mL/min (by C-G formula based on SCr of 0.68 mg/dL). No results for input(s): AST, ALT, ALKPHOS, BILITOT, PROT, ALBUMIN  in the last 168 hours. No results for input(s): LIPASE, AMYLASE in the last 168 hours. No results for input(s): AMMONIA in the last 168 hours. Coagulation Profile: No results for input(s): INR, PROTIME in the last 168 hours. Cardiac Panel (last 3 results) No results for input(s): CKTOTAL, CKMB, TROPONINIHS, RELINDX in the last 72 hours.  BNP (last 3 results) Recent Labs    12/08/23 1700  PROBNP 725.0*   HbA1C: No results for input(s): HGBA1C in the last 72 hours. CBG: No results for input(s): GLUCAP in the last 168 hours. Lipid Profile: No results for input(s): CHOL, HDL, LDLCALC, TRIG, CHOLHDL, LDLDIRECT in the last 72  hours. Thyroid  Function Tests: No results for input(s): TSH, T4TOTAL, FREET4, T3FREE, THYROIDAB in the last 72 hours. Urine analysis:    Component Value Date/Time   COLORURINE YELLOW 12/10/2023 1953   APPEARANCEUR HAZY (A) 12/10/2023 1953   LABSPEC 1.012 12/10/2023 1953   PHURINE 5.0 12/10/2023 1953   GLUCOSEU NEGATIVE 12/10/2023 1953   GLUCOSEU NEGATIVE 12/26/2012 0934   HGBUR SMALL (A) 12/10/2023 1953   BILIRUBINUR NEGATIVE 12/10/2023 1953   BILIRUBINUR neg 11/02/2013 1522   KETONESUR NEGATIVE 12/10/2023 1953   PROTEINUR NEGATIVE 12/10/2023 1953   UROBILINOGEN 0.2 11/02/2013 1522   UROBILINOGEN 0.2 12/26/2012 0934   NITRITE NEGATIVE 12/10/2023 1953   LEUKOCYTESUR MODERATE (A) 12/10/2023 1953    Radiological Exams on Admission: DG Chest 2 View Result Date: 02/03/2024 CLINICAL DATA:  chest pain EXAM: CHEST - 2 VIEW COMPARISON:  12/29/2023 FINDINGS: No focal airspace consolidation, pleural effusion, or pneumothorax. No cardiomegaly. Tortuous aorta with aortic atherosclerosis. No acute fracture or destructive lesions. Multilevel thoracic osteophytosis. Right glenohumeral joint arthroplasty. IMPRESSION: No acute cardiopulmonary abnormality. Electronically Signed  By: Rogelia Myers M.D.   On: 02/03/2024 12:44      Mennie LAMY MD [facility]  If 7PM-7AM, please contact night-coverage [email or web address]  02/03/2024, 6:05 PM

## 2024-02-03 NOTE — ED Triage Notes (Signed)
 Onset of coughing this week .  Chest pain for a couple of days.Left side chest pain radiates into back.  Recently left hospital for fluid on lungs and AF

## 2024-02-03 NOTE — ED Notes (Signed)
 Called Brittany Acosta at [facility] for hosp consult

## 2024-02-04 ENCOUNTER — Other Ambulatory Visit (HOSPITAL_COMMUNITY): Payer: Self-pay

## 2024-02-04 ENCOUNTER — Observation Stay (HOSPITAL_COMMUNITY)

## 2024-02-04 DIAGNOSIS — I2081 Angina pectoris with coronary microvascular dysfunction: Secondary | ICD-10-CM | POA: Diagnosis not present

## 2024-02-04 DIAGNOSIS — R059 Cough, unspecified: Secondary | ICD-10-CM

## 2024-02-04 DIAGNOSIS — R079 Chest pain, unspecified: Secondary | ICD-10-CM | POA: Diagnosis not present

## 2024-02-04 DIAGNOSIS — I480 Paroxysmal atrial fibrillation: Secondary | ICD-10-CM | POA: Diagnosis not present

## 2024-02-04 LAB — CBC
HCT: 31.3 % — ABNORMAL LOW (ref 36.0–46.0)
Hemoglobin: 10.2 g/dL — ABNORMAL LOW (ref 12.0–15.0)
MCH: 27.2 pg (ref 26.0–34.0)
MCHC: 32.6 g/dL (ref 30.0–36.0)
MCV: 83.5 fL (ref 80.0–100.0)
Platelets: 431 K/uL — ABNORMAL HIGH (ref 150–400)
RBC: 3.75 MIL/uL — ABNORMAL LOW (ref 3.87–5.11)
RDW: 17.3 % — ABNORMAL HIGH (ref 11.5–15.5)
WBC: 9.6 K/uL (ref 4.0–10.5)
nRBC: 0 % (ref 0.0–0.2)

## 2024-02-04 LAB — BASIC METABOLIC PANEL WITH GFR
Anion gap: 13 (ref 5–15)
BUN: 11 mg/dL (ref 8–23)
CO2: 28 mmol/L (ref 22–32)
Calcium: 9 mg/dL (ref 8.9–10.3)
Chloride: 94 mmol/L — ABNORMAL LOW (ref 98–111)
Creatinine, Ser: 0.88 mg/dL (ref 0.44–1.00)
GFR, Estimated: >60 mL/min (ref >60)
Glucose, Bld: 90 mg/dL (ref 70–99)
Potassium: 3.4 mmol/L — ABNORMAL LOW (ref 3.5–5.1)
Sodium: 135 mmol/L (ref 135–145)

## 2024-02-04 LAB — GLUCOSE, CAPILLARY: Glucose-Capillary: 106 mg/dL — ABNORMAL HIGH (ref 70–99)

## 2024-02-04 LAB — TSH: TSH: 4.599 u[IU]/mL — ABNORMAL HIGH (ref 0.350–4.500)

## 2024-02-04 LAB — T4, FREE: Free T4: 1.44 ng/dL — ABNORMAL HIGH (ref 0.61–1.12)

## 2024-02-04 MED ORDER — DILTIAZEM HCL ER COATED BEADS 180 MG PO CP24
180.0000 mg | ORAL_CAPSULE | Freq: Every day | ORAL | 0 refills | Status: DC
  Filled 2024-02-04: qty 30, 30d supply, fill #0

## 2024-02-04 MED ORDER — LEVOTHYROXINE SODIUM 75 MCG PO TABS: 37.5000 ug | ORAL_TABLET | Freq: Every day | ORAL | Status: DC

## 2024-02-04 MED ORDER — LEVOTHYROXINE SODIUM 75 MCG PO TABS
37.5000 ug | ORAL_TABLET | Freq: Every day | ORAL | 0 refills | Status: DC
  Filled 2024-02-04: qty 15, 30d supply, fill #0

## 2024-02-04 NOTE — Progress Notes (Addendum)
 Progress Note  Patient Name: Brittany Acosta Date of Encounter: 02/04/2024  Primary Cardiologist: Shelda Bruckner, MD  Subjective   Chest pain is improved after starting diltiazem.  Still has cough but that this is also improved according to the patient.  Inpatient Medications    Scheduled Meds:  allopurinol   300 mg Oral Daily   aspirin  EC  81 mg Oral Daily   cholecalciferol   5,000 Units Oral QHS   diltiazem  180 mg Oral Daily   doxepin   10 mg Oral Daily   ferrous sulfate   325 mg Oral q AM   fluticasone   2 spray Each Nare Daily   folic acid   2 mg Oral q AM   furosemide   40 mg Oral Daily   insulin  aspart  0-5 Units Subcutaneous QHS   insulin  aspart  0-9 Units Subcutaneous TID WC   insulin  glargine-yfgn  15 Units Subcutaneous Daily   lamoTRIgine   200 mg Oral Q1200   [START ON 02/05/2024] levothyroxine   37.5 mcg Oral Daily   lisinopril   5 mg Oral Daily   oxybutynin   10 mg Oral QHS   pantoprazole   40 mg Oral Daily   pravastatin   20 mg Oral QHS   sertraline   100 mg Oral QHS   tamsulosin   0.4 mg Oral QHS   Continuous Infusions:  PRN Meds: acetaminophen  **OR** acetaminophen , cyclobenzaprine , nitroGLYCERIN , ondansetron  **OR** ondansetron  (ZOFRAN ) IV   Vital Signs    Vitals:   02/03/24 1722 02/03/24 1928 02/04/24 0402 02/04/24 0806  BP: (!) 151/61 (!) 163/60 (!) 141/69 (!) 146/57  Pulse: 79 92 86 81  Resp: 20 20 18 17   Temp: 98.4 F (36.9 C) 97.6 F (36.4 C) 98.2 F (36.8 C) 98.1 F (36.7 C)  TempSrc: Axillary Oral Oral Oral  SpO2: 99% 100% 96% 96%  Weight:      Height:        Intake/Output Summary (Last 24 hours) at 02/04/2024 1007 Last data filed at 02/04/2024 0403 Gross per 24 hour  Intake 356 ml  Output --  Net 356 ml   Filed Weights   02/03/24 1154  Weight: 90.3 kg    Telemetry     Personally reviewed.  NSR.  ECG    Not performed today.  Physical Exam   GEN: No acute distress.   Neck: No JVD. Cardiac: RRR, no murmur, rub, or gallop.   Respiratory: Nonlabored. Clear to auscultation bilaterally. GI: Soft, nontender, bowel sounds present. MS: No edema; No deformity. Neuro:  Nonfocal. Psych: Alert and oriented x 3. Normal affect.  Labs    Chemistry Recent Labs  Lab 02/03/24 1210 02/04/24 0411  NA 135 135  K 4.4 3.4*  CL 94* 94*  CO2 26 28  GLUCOSE 104* 90  BUN 13 11  CREATININE 0.68 0.88  CALCIUM  10.2 9.0  GFRNONAA >60 >60  ANIONGAP 14 13     Hematology Recent Labs  Lab 02/03/24 1210 02/04/24 0411  WBC 13.1* 9.6  RBC 3.86* 3.75*  HGB 10.6* 10.2*  HCT 32.4* 31.3*  MCV 83.9 83.5  MCH 27.5 27.2  MCHC 32.7 32.6  RDW 17.2* 17.3*  PLT 454* 431*    Cardiac Enzymes Recent Labs  Lab 02/03/24 1847 02/03/24 2055  TROPONINIHS 7 8    BNPNo results for input(s): BNP, PROBNP in the last 168 hours.   DDimerNo results for input(s): DDIMER in the last 168 hours.   Radiology    DG Chest 2 View Result Date: 02/03/2024 CLINICAL DATA:  chest pain EXAM: CHEST - 2 VIEW COMPARISON:  12/29/2023 FINDINGS: No focal airspace consolidation, pleural effusion, or pneumothorax. No cardiomegaly. Tortuous aorta with aortic atherosclerosis. No acute fracture or destructive lesions. Multilevel thoracic osteophytosis. Right glenohumeral joint arthroplasty. IMPRESSION: No acute cardiopulmonary abnormality. Electronically Signed   By: Rogelia Myers M.D.   On: 02/03/2024 12:44    Assessment & Plan   Chest pain secondary to ?CMD - Presented with substernal nonradiating chest pressure mainly at rest occurring multiple times throughout the day x 1 week.  Nonreproducible on palpation. - EKG showed NSR, RBBB. - High-sensitivity Troponin I within normal limits.  High-sensitivity Troponin T mildly elevated, 39 at OSH.  Prior high-sensitivity troponin T were also elevated around 1 month ago, 28, 29. - CT cardiac in June 2025 showed coronary calcium  score of 0.636, 32nd percentile for age and sex matched control. TPV is  mild.  Minimal nonobstructive CAD (less than 24% minimal mixed plaque in the distal LAD) noted. - NM PET/CT in May 2025 showed decrease in myocardial blood flow reserve, 1.71 (normal more than 2). - She does have chest pressure, cough, leukocytosis however chest x-ray did not reveal any infectious etiology.  Follow-up on procalcitonin. 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  She noticed a significant improvement in her chest pains but still has some. - CRP normal.  ESR 35.  Her chest pain history is not convincing for pericarditis.  Cough - ??  Drug-induced.  On lisinopril  but chronically.  Defer to primary team.   Paroxysmal atrial fibrillation - Transient/one-time occurrence in the setting of recent pericarditis/pericardial effusion.  Not on DOAC.  Will monitor.  Telemetry reviewed, in NSR.  [facility] will sign off.   Medication Recommendations: Continue current medications Other recommendations (labs, testing, etc): None Follow up as an outpatient: Scheduled cardiology outpatient follow-up in 1 month on discharge  30 minutes spent in reviewing prior medical records, specialist notes, more than 3 labs, discussion and documentation.  Signed, Ayako Tapanes SHAUNNA Maywood, MD  02/04/2024, 10:07 AM

## 2024-02-04 NOTE — Plan of Care (Signed)

## 2024-02-04 NOTE — Discharge Summary (Signed)
 Physician Discharge Summary  Brittany Acosta FMW:[id number] DOB: Aug 20, 1950 DOA: 02/03/2024  PCP: [name], MD  Admit date: 02/03/2024 Discharge date: 02/04/2024 Recommendations for Outpatient Follow-up:  Follow up with PCP in 1 weeks-call for appointment Please obtain BMP/CBC in one week  Discharge Dispo: Home Discharge Condition: Stable Code Status:   Code Status: Full Code Diet recommendation:  Diet Order             Diet Carb Modified           Diet Carb Modified Fluid consistency: Thin; Room service appropriate? Yes  Diet effective now                    Brief/Interim Summary: Brittany Acosta is a 73 y.o. female with PMH of recent acute idiopathic pericarditis/pericardial effusion needing pericardiocentesis, echo subsequently showing resolution of effusion, paroxysmal A-fib, hyponatremia, hypothyroidism diabetes, hypertension,, hyperlipidemia who presented with chest pain present on and off for as week, described as pressure sensation, mostly at rest and at night, non exertional, with associated productive cough. Pain felt like similar symptoms in the past when she was recently hospitalized and discharged 01/02/2024-with pericardial effusion -had pericardiocentesis , new onset A-fib along with multifocal pneumonia and discharged to rehab for 2 weeks.  She was [facility]: Vitals fairly stable troponin 36>39  COVID flu RSV  negative. CXR-did not show any focal infiltrates EKG with new left anterior fascicular block given ongoing chest pain transferred to [facility].  Rest of the labs with mild leukocytosis which is downtrending at 13 from recent 14.7, hemoglobin 10.6 stable. Patient was admitted seen by cardiology Cardizem started with improvement in the chest pain. She is cleared for discharge home by cardiology  Subjective: Seen and examined this morning Chest pain improved after Cardizem Having some cough and lisinopril  discontinued Overnight afebrile on room  air BP stable, labs overall unremarkable except for mild hypokalemia CRP 0.8 troponin has been -7, 8 TSH 4.5 free T4- 1.44  Discharge Diagnoses:   Chest pain Elevated troponin mild flat: Presented with substernal chest pain nonradiating at rest EKG NSR, RBBB, initial trops in 30s, but repeat troponin negativex2 had CT cardiac in June 2025,NM PET/CT in May 2025. Chest x-ray no acute infectious finding Recent pericarditis back in September, but she has been normal ESR about the same and 35, cardiology input appreciated  Started on Cardizem, continue home aspirin  81, statin Seen by cardiology this morning okay for discharge home and they have arranged outpatient follow-up  Cough: ??  Drug-induced.  On lisinopril  chronically, chest x-ray unremarkable.  PAF: Transient/one-time occurrence in the setting of pericarditis on last admission not on DOAC.  Monitor  Recent pericarditis/pericardial effusion needing pericardiocentesis in September Chest x-ray unremarkable.  Hypothyroidism TSH of 4.5 and free T4 UP AT 1.4- will decrease Synthroid  50 to 37.5 mcg as a month ago TSH was 6.8 free T41.3-needs repeat in 3 weeks to adjust  Hypercholesterolemia Continue pravastatin   Bipolar 1 disorder Fibromyalgia: Continue home meds  Hypertension BP fairly controlled.  Continue lisinopril , on CCB as above  GERD: Continue PPI  Diabetes mellitus on long-term insulin /metformin : Resume insulin  regimen, SSI hold p.o. 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  DVT prophylaxis: SCDs Start: 02/03/24 1805 Code Status:   Code Status: Full Code  Family Communication: plan of care discussed with patient at bedside. Patient status is: Remains hospitalized because of severity of illness Level of care: Telemetry   Dispo: The patient is  from: home            Anticipated disposition:  home today Objective: Vitals last 24 hrs: Vitals:   02/03/24 1722 02/03/24 1928 02/04/24 0402 02/04/24 0806  BP: (!) 151/61 (!) 163/60 (!) 141/69 (!) 146/57  Pulse: 79 92 86 81  Resp: 20 20 18 17   Temp: 98.4 F (36.9 C) 97.6 F (36.4 C) 98.2 F (36.8 C) 98.1 F (36.7 C)  TempSrc: Axillary Oral Oral Oral  SpO2: 99% 100% 96% 96%  Weight:      Height:        Physical Examination: General exam: [id number] HEENT:Oral mucosa moist, Ear/Nose WNL grossly Respiratory system: Bilaterally clear BS,no use of accessory muscle Cardiovascular system: S1 & S2 +, No JVD. Gastrointestinal system: Abdomen soft,NT,ND, BS+ Nervous System: Alert, awake, moving all extremities,and following commands. Extremities: extremities warm, leg edema neg Skin: Warm, no rashes MSK: Normal muscle bulk,tone, power     Consultation: See note.  Discharge Instructions  Discharge Instructions     Diet Carb Modified   Complete by: As directed    Discharge instructions   Complete by: As directed    Please call call MD or return to ER for similar or worsening recurring problem that brought you to hospital or if any fever,nausea/vomiting,abdominal pain, uncontrolled pain, chest pain,  shortness of breath or any other alarming symptoms.  Please follow-up your doctor as instructed in a week time and call the office for appointment.  Please avoid alcohol, smoking, or any other illicit substance and maintain healthy habits including taking your regular medications as prescribed.  You were cared for by a hospitalist during your hospital stay. If you have any questions about your discharge medications or the care you received while you were in the hospital after you are discharged, you can call the unit and ask to speak with the hospitalist on call if the hospitalist that took care of you is not available.  Once you are discharged, your primary care physician will handle  any further medical issues. Please note that NO REFILLS for any discharge medications will be authorized once you are discharged, as it is imperative that you return to your primary care physician (or establish a relationship with a primary care physician if you do not have one) for your aftercare needs so that they can reassess your need for medications and monitor your lab values   Increase activity slowly   Complete by: As directed       Allergies as of 02/04/2024   No Known Allergies      Medication List     PAUSE taking these medications    lisinopril  5 MG tablet Wait to take this until your doctor or other care provider tells you to start again. Commonly known as: ZESTRIL  Take 1 tablet (5 mg total) by mouth daily.       TAKE these medications    allopurinol  300 MG tablet Commonly known as: ZYLOPRIM  Take 1 tablet (300 mg total) by mouth daily.   aspirin  EC 81 MG tablet Take 1 tablet (81 mg total) by mouth daily. Swallow whole.   cyclobenzaprine  10 MG tablet Commonly known as: FLEXERIL  Take 1 tablet (10 mg) by mouth at bedtime as needed for muscle spasms.   diltiazem 180 MG 24 hr capsule Commonly known  as: CARDIZEM CD Take 1 capsule (180 mg total) by mouth daily. Start taking on: February 05, 2024   doxepin  10 MG capsule Commonly known as: SINEQUAN  Take 1 capsule (10 mg total) by mouth daily.   EARACHE DROPS OT Place 1-2 drops in ear(s) 3 (three) times daily as needed (ear pain.).   ferrous sulfate  325 (65 FE) MG tablet Take 325 mg by mouth in the morning.   fluticasone  50 MCG/ACT nasal spray Commonly known as: FLONASE  Place 2 sprays into both nostrils daily.   folic acid  1 MG tablet Commonly known as: FOLVITE  Take 2 tablets (2 mg total) by mouth in the morning.   furosemide  40 MG tablet Commonly known as: LASIX  Take 1 tablet (40 mg total) by mouth daily.   lamoTRIgine  200 MG tablet Commonly known as: LAMICTAL  TAKE 1 TABLET (200 MG TOTAL) BY MOUTH  DAILY AT 12 NOON.   levothyroxine  75 MCG tablet Commonly known as: SYNTHROID  Take 0.5 tablets (37.5 mcg total) by mouth daily. Start taking on: February 05, 2024 What changed:  medication strength how much to take   magnesium  oxide 400 (240 Mg) MG tablet Commonly known as: MAG-OX Take 400 mg by mouth in the morning.   metFORMIN  500 MG tablet Commonly known as: GLUCOPHAGE  Take 2 tablets (1,000 mg total) by mouth every morning AND 1 tablet (500 mg total) every evening.   omeprazole  20 MG capsule Commonly known as: PRILOSEC TAKE 1 CAPSULE (20 MG TOTAL) BY MOUTH DAILY. TAKE 30 MINUTES BEFORE A MEAL   ondansetron  4 MG disintegrating tablet Commonly known as: ZOFRAN -ODT Take 1 tablet (4 mg total) by mouth every 4 (four) hours as needed for nausea/vomiting   OneTouch Delica Plus Lancet33G Misc Use as directed to monitor glucose daily and as needed.   OneTouch Verio test strip Generic drug: glucose blood TEST BLOOD SUGAR EVERY DAY AND AS NEEDED   oxybutynin  10 MG 24 hr tablet Commonly known as: DITROPAN -XL Take 1 tablet (10 mg total) by mouth at bedtime.   potassium chloride  SA 20 MEQ tablet Commonly known as: KLOR-CON  M Take 1 tablet (20 mEq total) by mouth daily.   pravastatin  20 MG tablet Commonly known as: PRAVACHOL  Take 1 tablet (20 mg total) by mouth at bedtime.   sertraline  100 MG tablet Commonly known as: ZOLOFT  Take 1 tablet (100 mg total) by mouth at bedtime.   Systane Ultra 0.4-0.3 % Soln Generic drug: Polyethyl Glycol-Propyl Glycol Place 1-2 drops into both eyes 3 (three) times daily as needed (dry/irritated eyes).   tamsulosin  0.4 MG Caps capsule Commonly known as: FLOMAX  Take 1 capsule (0.4 mg total) by mouth at bedtime.   Tresiba  FlexTouch 100 UNIT/ML FlexTouch Pen Generic drug: insulin  degludec Inject 26 Units into the skin in the morning.   Vitamin D3 125 MCG (5000 UT) Caps Take 5,000 Units by mouth at bedtime.        Follow-up Information      Vannie Reche RAMAN, NP Follow up.   Specialty: Cardiology Why: Hospital follow-up with Cardiology scheduled for 02/24/2024 at 11:20am. Please arrive 15 minutes early for check-in. If this date/ time does not work for you, please call our office to reschedule. Contact information: [address] [place] [address] [phone]         [name], MD Follow up in 1 week(s).   Specialty: Family Medicine Contact information: [address] [place] [address] [phone]  No Known Allergies  The results of significant diagnostics from this hospitalization (including imaging, microbiology, ancillary and laboratory) are listed below for reference.    Microbiology: Recent Results (from the past 240 hours)  SARS Coronavirus 2 by RT PCR (hospital order, performed in [facility] hospital lab) *cepheid single result test* Anterior Nasal Swab     Status: None   Collection Time: 02/03/24  1:25 PM   Specimen: Anterior Nasal Swab  Result Value Ref Range Status   SARS Coronavirus 2 by RT PCR NEGATIVE NEGATIVE Final    Comment: (NOTE) SARS-CoV-2 target nucleic acids are NOT DETECTED.  The SARS-CoV-2 RNA is generally detectable in upper and lower respiratory specimens during the acute phase of infection. The lowest concentration of SARS-CoV-2 viral copies this assay can detect is 250 copies / mL. A negative result does not preclude SARS-CoV-2 infection and should not be used as the sole basis for treatment or other patient management decisions.  A negative result may occur with improper specimen collection / handling, submission of specimen other than nasopharyngeal swab, presence of viral mutation(s) within the areas targeted by this assay, and inadequate number of viral copies (<250 copies / mL). A negative result must be combined with clinical observations, patient history, and epidemiological information.  Fact Sheet for Patients:    roadlaptop.co.za  Fact Sheet for Healthcare Providers: http://kim-miller.com/  This test is not yet approved or  cleared by the [place]  FDA and has been authorized for detection and/or diagnosis of SARS-CoV-2 by FDA under an Emergency Use Authorization (EUA).  This EUA will remain in effect (meaning this test can be used) for the duration of the COVID-19 declaration under Section 564(b)(1) of the Act, 21 U.S.C. section 360bbb-3(b)(1), unless the authorization is terminated or revoked sooner.  Performed at [facility], [address] [place], [place] [address]     Procedures/Studies: DG Chest 2 View Result Date: 02/03/2024 CLINICAL DATA:  chest pain EXAM: CHEST - 2 VIEW COMPARISON:  12/29/2023 FINDINGS: No focal airspace consolidation, pleural effusion, or pneumothorax. No cardiomegaly. Tortuous aorta with aortic atherosclerosis. No acute fracture or destructive lesions. Multilevel thoracic osteophytosis. Right glenohumeral joint arthroplasty. IMPRESSION: No acute cardiopulmonary abnormality. Electronically Signed   By: Rogelia Myers M.D.   On: 02/03/2024 12:44    Labs: BNP (last 3 results) Recent Labs    12/14/23 0222 12/31/23 0430  BNP 244.9* 364.7*   Basic Metabolic Panel: Recent Labs  Lab 02/03/24 1210 02/04/24 0411  NA 135 135  K 4.4 3.4*  CL 94* 94*  CO2 26 28  GLUCOSE 104* 90  BUN 13 11  CREATININE 0.68 0.88  CALCIUM  10.2 9.0   Liver Function Tests: No results for input(s): AST, ALT, ALKPHOS, BILITOT, PROT, ALBUMIN  in the last 168 hours. No results for input(s): LIPASE, AMYLASE in the last 168 hours. No results for input(s): AMMONIA in the last 168 hours. CBC: Recent Labs  Lab 02/03/24 1210 02/04/24 0411  WBC 13.1* 9.6  HGB 10.6* 10.2*  HCT 32.4* 31.3*  MCV 83.9 83.5  PLT 454* 431*   CBG: Recent Labs  Lab 02/04/24 0809  GLUCAP 106*   Recent Labs     02/04/24 0411  TSH 4.599*   Urinalysis    Component Value Date/Time   COLORURINE YELLOW 12/10/2023 1953   APPEARANCEUR HAZY (A) 12/10/2023 1953   LABSPEC 1.012 12/10/2023 1953   PHURINE 5.0 12/10/2023 1953   GLUCOSEU NEGATIVE 12/10/2023 1953   GLUCOSEU NEGATIVE 12/26/2012 0934  HGBUR SMALL (A) 12/10/2023 1953   BILIRUBINUR NEGATIVE 12/10/2023 1953   BILIRUBINUR neg 11/02/2013 1522   KETONESUR NEGATIVE 12/10/2023 1953   PROTEINUR NEGATIVE 12/10/2023 1953   UROBILINOGEN 0.2 11/02/2013 1522   UROBILINOGEN 0.2 12/26/2012 0934   NITRITE NEGATIVE 12/10/2023 1953   LEUKOCYTESUR MODERATE (A) 12/10/2023 1953   Sepsis Labs Recent Labs  Lab 02/03/24 1210 02/04/24 0411  WBC 13.1* 9.6   Microbiology Recent Results (from the past 240 hours)  SARS Coronavirus 2 by RT PCR (hospital order, performed in [facility] hospital lab) *cepheid single result test* Anterior Nasal Swab     Status: None   Collection Time: 02/03/24  1:25 PM   Specimen: Anterior Nasal Swab  Result Value Ref Range Status   SARS Coronavirus 2 by RT PCR NEGATIVE NEGATIVE Final    Comment: (NOTE) SARS-CoV-2 target nucleic acids are NOT DETECTED.  The SARS-CoV-2 RNA is generally detectable in upper and lower respiratory specimens during the acute phase of infection. The lowest concentration of SARS-CoV-2 viral copies this assay can detect is 250 copies / mL. A negative result does not preclude SARS-CoV-2 infection and should not be used as the sole basis for treatment or other patient management decisions.  A negative result may occur with improper specimen collection / handling, submission of specimen other than nasopharyngeal swab, presence of viral mutation(s) within the areas targeted by this assay, and inadequate number of viral copies (<250 copies / mL). A negative result must be combined with clinical observations, patient history, and epidemiological information.  Fact Sheet for Patients:    roadlaptop.co.za  Fact Sheet for Healthcare Providers: http://kim-miller.com/  This test is not yet approved or  cleared by the [place]  FDA and has been authorized for detection and/or diagnosis of SARS-CoV-2 by FDA under an Emergency Use Authorization (EUA).  This EUA will remain in effect (meaning this test can be used) for the duration of the COVID-19 declaration under Section 564(b)(1) of the Act, 21 U.S.C. section 360bbb-3(b)(1), unless the authorization is terminated or revoked sooner.  Performed at [facility], [address] [place], [place] [address]    Time coordinating discharge: 25 minutes  SIGNED: Mennie LAMY, MD  [facility] 02/04/2024, 2:37 PM  If 7PM-7AM, please contact night-coverage [email or web address]

## 2024-02-04 NOTE — Plan of Care (Signed)
 Problem: Education: Goal: Knowledge of General Education information will improve Description: Including pain rating scale, medication(s)/side effects and non-pharmacologic comfort measures 02/04/2024 1210 by Marylu Randine NOVAK, RN Outcome: Adequate for Discharge 02/04/2024 1209 by Marylu Randine NOVAK, RN Outcome: Progressing   Problem: Health Behavior/Discharge Planning: Goal: Ability to manage health-related needs will improve 02/04/2024 1210 by Marylu Randine NOVAK, RN Outcome: Adequate for Discharge 02/04/2024 1209 by Marylu Randine NOVAK, RN Outcome: Progressing   Problem: Clinical Measurements: Goal: Ability to maintain clinical measurements within normal limits will improve 02/04/2024 1210 by Marylu Randine NOVAK, RN Outcome: Adequate for Discharge 02/04/2024 1209 by Marylu Randine NOVAK, RN Outcome: Progressing Goal: Will remain free from infection 02/04/2024 1210 by Marylu Randine NOVAK, RN Outcome: Adequate for Discharge 02/04/2024 1209 by Marylu Randine NOVAK, RN Outcome: Progressing Goal: Diagnostic test results will improve 02/04/2024 1210 by Marylu Randine NOVAK, RN Outcome: Adequate for Discharge 02/04/2024 1209 by Marylu Randine NOVAK, RN Outcome: Progressing Goal: Respiratory complications will improve 02/04/2024 1210 by Marylu Randine NOVAK, RN Outcome: Adequate for Discharge 02/04/2024 1209 by Marylu Randine NOVAK, RN Outcome: Progressing Goal: Cardiovascular complication will be avoided 02/04/2024 1210 by Marylu Randine NOVAK, RN Outcome: Adequate for Discharge 02/04/2024 1209 by Marylu Randine NOVAK, RN Outcome: Progressing   Problem: Activity: Goal: Risk for activity intolerance will decrease 02/04/2024 1210 by Marylu Randine NOVAK, RN Outcome: Adequate for Discharge 02/04/2024 1209 by Marylu Randine NOVAK, RN Outcome: Progressing   Problem: Nutrition: Goal: Adequate nutrition will be maintained 02/04/2024 1210 by Marylu Randine NOVAK, RN Outcome: Adequate for Discharge 02/04/2024 1209 by Marylu Randine NOVAK, RN Outcome: Progressing    Problem: Coping: Goal: Level of anxiety will decrease 02/04/2024 1210 by Marylu Randine NOVAK, RN Outcome: Adequate for Discharge 02/04/2024 1209 by Marylu Randine NOVAK, RN Outcome: Progressing   Problem: Elimination: Goal: Will not experience complications related to bowel motility 02/04/2024 1210 by Marylu Randine NOVAK, RN Outcome: Adequate for Discharge 02/04/2024 1209 by Marylu Randine NOVAK, RN Outcome: Progressing Goal: Will not experience complications related to urinary retention 02/04/2024 1210 by Marylu Randine NOVAK, RN Outcome: Adequate for Discharge 02/04/2024 1209 by Marylu Randine NOVAK, RN Outcome: Progressing   Problem: Pain Managment: Goal: General experience of comfort will improve and/or be controlled 02/04/2024 1210 by Marylu Randine NOVAK, RN Outcome: Adequate for Discharge 02/04/2024 1209 by Marylu Randine NOVAK, RN Outcome: Progressing   Problem: Safety: Goal: Ability to remain free from injury will improve 02/04/2024 1210 by Marylu Randine NOVAK, RN Outcome: Adequate for Discharge 02/04/2024 1209 by Marylu Randine NOVAK, RN Outcome: Progressing   Problem: Skin Integrity: Goal: Risk for impaired skin integrity will decrease 02/04/2024 1210 by Marylu Randine NOVAK, RN Outcome: Adequate for Discharge 02/04/2024 1209 by Marylu Randine NOVAK, RN Outcome: Progressing   Problem: Education: Goal: Ability to describe self-care measures that may prevent or decrease complications (Diabetes Survival Skills Education) will improve 02/04/2024 1210 by Marylu Randine NOVAK, RN Outcome: Adequate for Discharge 02/04/2024 1209 by Marylu Randine NOVAK, RN Outcome: Progressing Goal: Individualized Educational Video(s) 02/04/2024 1210 by Marylu Randine NOVAK, RN Outcome: Adequate for Discharge 02/04/2024 1209 by Marylu Randine NOVAK, RN Outcome: Progressing   Problem: Coping: Goal: Ability to adjust to condition or change in health will improve 02/04/2024 1210 by Marylu Randine NOVAK, RN Outcome: Adequate for Discharge 02/04/2024 1209 by Marylu Randine NOVAK,  RN Outcome: Progressing   Problem: Fluid Volume: Goal: Ability to maintain a balanced intake and output will improve 02/04/2024 1210 by Marylu Randine NOVAK, RN Outcome: Adequate for Discharge 02/04/2024  1209 by Marylu Randine NOVAK, RN Outcome: Progressing   Problem: Health Behavior/Discharge Planning: Goal: Ability to identify and utilize available resources and services will improve 02/04/2024 1210 by Marylu Randine NOVAK, RN Outcome: Adequate for Discharge 02/04/2024 1209 by Marylu Randine NOVAK, RN Outcome: Progressing Goal: Ability to manage health-related needs will improve 02/04/2024 1210 by Marylu Randine NOVAK, RN Outcome: Adequate for Discharge 02/04/2024 1209 by Marylu Randine NOVAK, RN Outcome: Progressing   Problem: Metabolic: Goal: Ability to maintain appropriate glucose levels will improve 02/04/2024 1210 by Marylu Randine NOVAK, RN Outcome: Adequate for Discharge 02/04/2024 1209 by Marylu Randine NOVAK, RN Outcome: Progressing   Problem: Nutritional: Goal: Maintenance of adequate nutrition will improve 02/04/2024 1210 by Marylu Randine NOVAK, RN Outcome: Adequate for Discharge 02/04/2024 1209 by Marylu Randine NOVAK, RN Outcome: Progressing Goal: Progress toward achieving an optimal weight will improve 02/04/2024 1210 by Marylu Randine NOVAK, RN Outcome: Adequate for Discharge 02/04/2024 1209 by Marylu Randine NOVAK, RN Outcome: Progressing   Problem: Skin Integrity: Goal: Risk for impaired skin integrity will decrease 02/04/2024 1210 by Marylu Randine NOVAK, RN Outcome: Adequate for Discharge 02/04/2024 1209 by Marylu Randine NOVAK, RN Outcome: Progressing   Problem: Tissue Perfusion: Goal: Adequacy of tissue perfusion will improve 02/04/2024 1210 by Marylu Randine NOVAK, RN Outcome: Adequate for Discharge 02/04/2024 1209 by Marylu Randine NOVAK, RN Outcome: Progressing

## 2024-02-04 NOTE — Care Management Obs Status (Signed)
 MEDICARE OBSERVATION STATUS NOTIFICATION   Patient Details  Name: Brittany Acosta MRN: [id number] Date of Birth: 15-Jan-1951   Medicare Observation Status Notification Given:  Yes    Genia Perin G., RN 02/04/2024, 10:05 AM

## 2024-02-06 ENCOUNTER — Other Ambulatory Visit: Payer: Self-pay

## 2024-02-06 ENCOUNTER — Other Ambulatory Visit (HOSPITAL_COMMUNITY): Payer: Self-pay

## 2024-02-06 LAB — GLUCOSE, CAPILLARY: Glucose-Capillary: 76 mg/dL (ref 70–99)

## 2024-02-13 ENCOUNTER — Encounter: Payer: Self-pay | Admitting: Pharmacist

## 2024-02-13 NOTE — Progress Notes (Signed)
 Pharmacy Quality Measure Review  This patient is appearing on a report for being at risk of failing the adherence measure for hypertension (ACEi/ARB) medications this calendar year.   Medication: lisinopril   Last fill date: 01/02/2024  for 30day supply  Reviewed recent refill history in Epic and it shows last refill was 01/27/2024 for 90 DS. However, patient was hospitalized 02/03/2024 to 02/04/2024 for cough. Currently lisinopril  is paused to see if it might be contributing to her chronic cough.   Insurance report was not up to date. No action needed at this time.     Madelin Ray, PharmD Clinical Pharmacist [facility]  Population Health [phone]

## 2024-02-17 ENCOUNTER — Other Ambulatory Visit (HOSPITAL_BASED_OUTPATIENT_CLINIC_OR_DEPARTMENT_OTHER): Payer: Self-pay

## 2024-02-21 ENCOUNTER — Encounter: Payer: Self-pay | Admitting: Family Medicine

## 2024-02-21 ENCOUNTER — Ambulatory Visit (INDEPENDENT_AMBULATORY_CARE_PROVIDER_SITE_OTHER): Admitting: Family Medicine

## 2024-02-21 ENCOUNTER — Other Ambulatory Visit (HOSPITAL_COMMUNITY): Payer: Self-pay

## 2024-02-21 ENCOUNTER — Other Ambulatory Visit: Payer: Self-pay

## 2024-02-21 VITALS — BP 130/80 | HR 78 | Temp 97.7°F | Ht 61.5 in | Wt 203.5 lb

## 2024-02-21 DIAGNOSIS — R10A2 Flank pain, left side: Secondary | ICD-10-CM

## 2024-02-21 DIAGNOSIS — I10 Essential (primary) hypertension: Secondary | ICD-10-CM

## 2024-02-21 DIAGNOSIS — L4050 Arthropathic psoriasis, unspecified: Secondary | ICD-10-CM | POA: Diagnosis not present

## 2024-02-21 DIAGNOSIS — Z794 Long term (current) use of insulin: Secondary | ICD-10-CM

## 2024-02-21 DIAGNOSIS — E119 Type 2 diabetes mellitus without complications: Secondary | ICD-10-CM | POA: Diagnosis not present

## 2024-02-21 DIAGNOSIS — R600 Localized edema: Secondary | ICD-10-CM | POA: Diagnosis not present

## 2024-02-21 DIAGNOSIS — E039 Hypothyroidism, unspecified: Secondary | ICD-10-CM | POA: Diagnosis not present

## 2024-02-21 DIAGNOSIS — R0789 Other chest pain: Secondary | ICD-10-CM | POA: Diagnosis not present

## 2024-02-21 LAB — POC URINALSYSI DIPSTICK (AUTOMATED)
Bilirubin, UA: NEGATIVE
Glucose, UA: NEGATIVE
Ketones, UA: NEGATIVE
Leukocytes, UA: NEGATIVE
Nitrite, UA: NEGATIVE
Protein, UA: NEGATIVE
Spec Grav, UA: 1.01 (ref 1.010–1.025)
Urobilinogen, UA: NEGATIVE U/dL — AB
pH, UA: 6.5 (ref 5.0–8.0)

## 2024-02-21 MED ORDER — SULFASALAZINE 500 MG PO TBEC
500.0000 mg | DELAYED_RELEASE_TABLET | Freq: Four times a day (QID) | ORAL | 1 refills | Status: AC
  Filled 2024-02-21: qty 360, 90d supply, fill #0

## 2024-02-21 MED ORDER — DILTIAZEM HCL ER COATED BEADS 180 MG PO CP24
180.0000 mg | ORAL_CAPSULE | Freq: Every day | ORAL | 0 refills | Status: DC
  Filled 2024-02-21: qty 30, 30d supply, fill #0

## 2024-02-21 MED ORDER — METHOCARBAMOL 500 MG PO TABS
500.0000 mg | ORAL_TABLET | Freq: Four times a day (QID) | ORAL | 3 refills | Status: AC | PRN
  Filled 2024-02-21: qty 60, 15d supply, fill #0

## 2024-02-21 NOTE — Progress Notes (Signed)
 Subjective:     Patient ID: KEYANA GUEVARA, female    DOB: [birth year], 73 y.o.   MRN: [id number]  Chief Complaint  Patient presents with   Hypertension    Pt went back to hospital after last visit here to follow up    Discussed the use of AI scribe software for clinical note transcription with the patient, who gave verbal consent to proceed.  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 She is accompanied by her husband.  She was hospitalized from October 30 to November 1 for chest pain and heart racing. During her stay, an echocardiogram showed resolution of fluid from previous pericarditis. She was started on diltiazem during her hospitalization. No myocardial infarction occurred during this episode.  Since discharge, she experienced a tight feeling in her left upper arm lasting over a day, without associated chest pain. She also reports swelling in her ankles since starting diltiazem. She has been taking diltiazem 180 mg daily and furosemide  40 mg every morning. She has not been using compression stockings due to difficulty putting them on.  She reports symptoms suggestive of a nephrolithiasis starting yesterday, with pain in the left lower abdomen radiating to the back, frequent urination, and mild dysuria. She reports a history of frequent kidney stones over 20 years ago and recalls being treated with potassium at [facility], which resolved her symptoms. No hematuria, fever, or chills. She is taking tamsulosin  at night.  She has been experiencing dyspnea and has been elevating her feet to manage swelling. She notes a weight gain of four pounds since her hospital stay. She is currently taking several medications including allopurinol , aspirin , cyclobenzaprine  as needed, doxepin , fluticasone , folic acid , lamotrigine , and Tresiba . She mistakenly took a full dose of  levothyroxine  75 mcg instead of the prescribed half dose. She resumed sulfasalazine  for psoriatic arthritis, taking two tablets twice a day, despite it being removed from her medication list during hospitalization.  She has not started physical therapy due to ongoing health issues. Her blood pressure at home has been slightly elevated, and she is not currently taking lisinopril  d/t cough.    Health Maintenance Due  Topic Date Due   Diabetic kidney evaluation - Urine ACR  12/24/2009   Medicare Annual Wellness (AWV)  09/04/2020   OPHTHALMOLOGY EXAM  06/07/2023   FOOT EXAM  12/10/2023    Past Medical History:  Diagnosis Date   Allergic rhinitis, cause unspecified    Arthritis    Bipolar disorder, unspecified (HCC)    depression   Cancer (HCC)    skin cancer on left leg, left breast cancer   Cataract    Chronic diastolic heart failure (HCC)    Depression    GERD (gastroesophageal reflux disease)    Headache    History of gout    haven't had it in several years (02/08/2012)   History of kidney stones    History of wrist fracture    rt wrist   Hypertension    Iron deficiency anemia    Morbid obesity (HCC)    Neuropathy due to secondary diabetes (HCC)    Nontoxic multinodular goiter    Osteoporosis    Personal history of radiation therapy    Pneumonia    PONV (postoperative nausea and vomiting)    Psoriatic arthritis (HCC)    PTSD (post-traumatic stress disorder)    Pure hypercholesterolemia  RBBB    Scleroderma (HCC)    Sleep apnea 10/2012   mild osa-did not need cpap -dr clance   Type II diabetes mellitus (HCC)    Unspecified hypothyroidism     Past Surgical History:  Procedure Laterality Date   ABDOMINAL HYSTERECTOMY  1976   BREAST EXCISIONAL BIOPSY Left    BREAST LUMPECTOMY Left 04/25/2018   BREAST LUMPECTOMY WITH RADIOACTIVE SEED AND SENTINEL LYMPH NODE BIOPSY Left 04/25/2018   Procedure: LEFT BREAST LUMPECTOMY WITH RADIOACTIVE SEED AND SENTINEL LYMPH NODE  BIOPSY;  Surgeon: Mikell Katz, MD;  Location: [facility] OR;  Service: General;  Laterality: Left;   BREAST SURGERY     CARDIAC CATHETERIZATION  2001   sees Dr Peter Jordan   CATARACT EXTRACTION  2014   CATARACT EXTRACTION, BILATERAL  02/2011   epps   CHOLECYSTECTOMY  1985   ESOPHAGOGASTRODUODENOSCOPY (EGD) WITH PROPOFOL  N/A 10/14/2022   Procedure: ESOPHAGOGASTRODUODENOSCOPY (EGD) WITH PROPOFOL ;  Surgeon: Wilhelmenia Aloha Raddle., MD;  Location: [facility];  Service: Gastroenterology;  Laterality: N/A;   EUS N/A 10/14/2022   Procedure: UPPER ENDOSCOPIC ULTRASOUND (EUS) RADIAL;  Surgeon: Wilhelmenia Aloha Raddle., MD;  Location: [facility];  Service: Gastroenterology;  Laterality: N/A;   EXCISIONAL HEMORRHOIDECTOMY     dr cut out in his office (02/08/2012)   INCISIONAL BREAST BIOPSY  2000   right   JOINT REPLACEMENT     rt knee   KNEE ARTHROSCOPY  08/2009   right   KNEE ARTHROSCOPY WITH MEDIAL MENISECTOMY     left   Left Cystoscopy   1990   LEFT OOPHORECTOMY  1980   Lithotripsy (L) Kidney  1997   LUMBAR LAMINECTOMY/DECOMPRESSION MICRODISCECTOMY Left 07/01/2016   Procedure: Laminectomy for synovial cyst - left - Lumbar four-five;  Surgeon: Victory Gunnels, MD;  Location: [facility] OR;  Service: Neurosurgery;  Laterality: Left;   PARTIAL MASTECTOMY WITH NEEDLE LOCALIZATION Left 09/01/2012   Procedure: PARTIAL MASTECTOMY WITH NEEDLE LOCALIZATION;  Surgeon: Elon CHRISTELLA Pacini, MD;  Location: [facility] OR;  Service: General;  Laterality: Left;   Percitania stone removed (L) Kidney  1992   PERICARDIOCENTESIS N/A 12/09/2023   Procedure: PERICARDIOCENTESIS;  Surgeon: Wonda Sharper, MD;  Location: [facility] IN[facility] CV [facility];  Service: Cardiovascular;  Laterality: N/A;   REVERSE SHOULDER ARTHROPLASTY Right 02/12/2021   Procedure: REVERSE SHOULDER ARTHROPLASTY;  Surgeon: Dozier Soulier, MD;  Location: [facility];  Service: Orthopedics;  Laterality: Right;   Right nasal surgery  08/1988   Right sinus removed  08/1989   tooth  partial   ROTATOR CUFF REPAIR  2013   right shoulder x 2   SHOULDER ARTHROSCOPY WITH ROTATOR CUFF REPAIR AND SUBACROMIAL DECOMPRESSION Left 08/20/2013   Procedure: SHOULDER ARTHROSCOPY  AND SUBACROMIAL DECOMPRESSION;  Surgeon: Soulier Elsie Dozier, MD;  Location: [facility];  Service: Orthopedics;  Laterality: Left;  Left shoulder arthroscopy, debridement, subacromial decompression, distal clavical resection   THYROIDECTOMY  04/22/2011   Procedure: THYROIDECTOMY;  Surgeon: Vaughan JONETTA Ricker, MD;  Location: [facility] OR;  Service: ENT;  Laterality: N/A;  TOTAL THYROIDECOTMY   TOTAL KNEE ARTHROPLASTY  06/18/2011   Procedure: TOTAL KNEE ARTHROPLASTY;  Surgeon: Dempsey JINNY Sensor, MD;  Location: [facility];  Service: Orthopedics;  Laterality: Left;  DEPUY   TOTAL KNEE ARTHROPLASTY  02/07/2012   Procedure: TOTAL KNEE ARTHROPLASTY;  Surgeon: Dempsey JINNY Sensor, MD;  Location: [facility];  Service: Orthopedics;  Laterality: Right;   TUBAL LIGATION  1972     Current Outpatient Medications:    allopurinol  (ZYLOPRIM )  300 MG tablet, Take 1 tablet (300 mg total) by mouth daily., Disp: 90 tablet, Rfl: 3   aspirin  EC 81 MG tablet, Take 1 tablet (81 mg total) by mouth daily. Swallow whole., Disp: 90 tablet, Rfl: 3   Cholecalciferol  (VITAMIN D3) 125 MCG (5000 UT) CAPS, Take 5,000 Units by mouth at bedtime., Disp: , Rfl:    cyclobenzaprine  (FLEXERIL ) 10 MG tablet, Take 1 tablet (10 mg) by mouth at bedtime as needed for muscle spasms., Disp: 90 tablet, Rfl: 0   doxepin  (SINEQUAN ) 10 MG capsule, Take 1 capsule (10 mg total) by mouth daily., Disp: 90 capsule, Rfl: 1   ferrous sulfate  325 (65 FE) MG tablet, Take 325 mg by mouth in the morning., Disp: , Rfl:    fluticasone  (FLONASE ) 50 MCG/ACT nasal spray, Place 2 sprays into both nostrils daily., Disp: 48 g, Rfl: 2   folic acid  (FOLVITE ) 1 MG tablet, Take 2 tablets (2 mg total) by mouth in the morning., Disp: 180 tablet, Rfl: 0   furosemide  (LASIX ) 40 MG tablet, Take 1  tablet (40 mg total) by mouth daily., Disp: 90 tablet, Rfl: 10   glucose blood (TRUE METRIX BLOOD GLUCOSE TEST) test strip, TEST BLOOD SUGAR EVERY DAY AND AS NEEDED, Disp: 200 strip, Rfl: 4   Homeopathic Products (EARACHE DROPS OT), Place 1-2 drops in ear(s) 3 (three) times daily as needed (ear pain.)., Disp: , Rfl:    insulin  degludec (TRESIBA  FLEXTOUCH) 100 UNIT/ML FlexTouch Pen, Inject 26 Units into the skin in the morning., Disp: 30 mL, Rfl: 3   lamoTRIgine  (LAMICTAL ) 200 MG tablet, TAKE 1 TABLET (200 MG TOTAL) BY MOUTH DAILY AT 12 NOON., Disp: 90 tablet, Rfl: 10   Lancets (ONETOUCH DELICA PLUS LANCET33G) MISC, Use as directed to monitor glucose daily and as needed., Disp: 200 each, Rfl: 3   levothyroxine  (SYNTHROID ) 75 MCG tablet, Take 0.5 tablets (37.5 mcg total) by mouth daily., Disp: 15 tablet, Rfl: 0   [Paused] lisinopril  (ZESTRIL ) 5 MG tablet, Take 1 tablet (5 mg total) by mouth daily., Disp: 90 tablet, Rfl: 10   magnesium  oxide (MAG-OX) 400 (240 Mg) MG tablet, Take 400 mg by mouth in the morning., Disp: , Rfl:    metFORMIN  (GLUCOPHAGE ) 500 MG tablet, Take 2 tablets (1,000 mg total) by mouth every morning AND 1 tablet (500 mg total) every evening., Disp: 270 tablet, Rfl: 0   methocarbamol  (ROBAXIN ) 500 MG tablet, Take 1 tablet (500 mg total) by mouth every 6 (six) hours as needed for muscle spasms., Disp: 60 tablet, Rfl: 3   omeprazole  (PRILOSEC) 20 MG capsule, TAKE 1 CAPSULE (20 MG TOTAL) BY MOUTH DAILY. TAKE 30 MINUTES BEFORE A MEAL, Disp: 90 capsule, Rfl: 10   ondansetron  (ZOFRAN -ODT) 4 MG disintegrating tablet, Take 1 tablet (4 mg total) by mouth every 4 (four) hours as needed for nausea/vomiting, Disp: 30 tablet, Rfl: 3   oxybutynin  (DITROPAN -XL) 10 MG 24 hr tablet, Take 1 tablet (10 mg total) by mouth at bedtime., Disp: 90 tablet, Rfl: 3   Polyethyl Glycol-Propyl Glycol (SYSTANE ULTRA) 0.4-0.3 % SOLN, Place 1-2 drops into both eyes 3 (three) times daily as needed (dry/irritated eyes).,  Disp: , Rfl:    potassium chloride  SA (KLOR-CON  M) 20 MEQ tablet, Take 1 tablet (20 mEq total) by mouth daily., Disp: 90 tablet, Rfl: 0   pravastatin  (PRAVACHOL ) 20 MG tablet, Take 1 tablet (20 mg total) by mouth at bedtime., Disp: 90 tablet, Rfl: 2   sertraline  (ZOLOFT ) 100 MG tablet,  Take 1 tablet (100 mg total) by mouth at bedtime., Disp: 90 tablet, Rfl: 1   tamsulosin  (FLOMAX ) 0.4 MG CAPS capsule, Take 1 capsule (0.4 mg total) by mouth at bedtime., Disp: 90 capsule, Rfl: 3   diltiazem (CARDIZEM CD) 180 MG 24 hr capsule, Take 1 capsule (180 mg total) by mouth daily., Disp: 30 capsule, Rfl: 0   sulfaSALAzine  (AZULFIDINE ) 500 MG EC tablet, Take 1 tablet (500 mg total) by mouth 4 (four) times daily., Disp: 360 tablet, Rfl: 1  No Known Allergies ROS neg/noncontributory except as noted HPI/below      Objective:     BP 130/80   Pulse 78   Temp 97.7 F (36.5 C) (Temporal)   Ht 5' 1.5 (1.562 m)   Wt 203 lb 8 oz (92.3 kg)   SpO2 97%   BMI 37.83 kg/m  Wt Readings from Last 3 Encounters:  02/21/24 203 lb 8 oz (92.3 kg)  02/03/24 199 lb (90.3 kg)  02/03/24 200 lb 4 oz (90.8 kg)    Physical Exam VITALS: BP- 130/80 GENERAL: Well developed, well nourished, no acute distress. HEAD EYES EARS NOSE THROAT: Normocephalic, atraumatic, conjunctiva not injected, sclera nonicteric. CARDIAC: Regular rate and rhythm, S1 S2 present, no murmur, dorsalis pedis 1 plus bilaterally. NECK: Supple, no thyromegaly, no nodes, no carotid bruits. LUNGS: Clear to auscultation bilaterally, no wheezes. ABDOMEN: Bowel sounds present, soft, non-tender, non-distended, no hepatosplenomegaly, no masses. Tenderness in L lower back with muscle spasms. EXTREMITIES: Ankle swelling present. MUSCULOSKELETAL: No gross abnormalities. NEUROLOGICAL: Alert and oriented x3, cranial nerves II through XII intact. PSYCHIATRIC: Normal mood, good eye contact.   Results for orders placed or performed in visit on 02/21/24  POCT  Urinalysis Dipstick (Automated)   Collection Time: 02/21/24  4:12 PM  Result Value Ref Range   Color, UA yellow    Clarity, UA clear    Glucose, UA Negative Negative   Bilirubin, UA neg    Ketones, UA neg    Spec Grav, UA 1.010 1.010 - 1.025   Blood, UA nrg    pH, UA 6.5 5.0 - 8.0   Protein, UA Negative Negative   Urobilinogen, UA negative (A) 0.2 or 1.0 E.U./dL   Nitrite, UA neg    Leukocytes, UA Negative Negative   *Note: Due to a large number of results and/or encounters for the requested time period, some results have not been displayed. A complete set of results can be found in Results Review.       Assessment & Plan:  Atypical chest pain  Essential hypertension -     dilTIAZem HCl ER Coated Beads; Take 1 capsule (180 mg total) by mouth daily.  Dispense: 30 capsule; Refill: 0 -     CBC with Differential/Platelet -     Comprehensive metabolic panel with GFR  Left flank pain -     POCT Urinalysis Dipstick (Automated)  Other orders -     Methocarbamol ; Take 1 tablet (500 mg total) by mouth every 6 (six) hours as needed for muscle spasms.  Dispense: 60 tablet; Refill: 3 -     sulfaSALAzine ; Take 1 tablet (500 mg total) by mouth 4 (four) times daily.  Dispense: 360 tablet; Refill: 1    Assessment and Plan Assessment & Plan Hospital Follow-up for Chest Pain   She was recently hospitalized for chest pain. An echocardiogram showed resolution of pericardial effusion.  is managed with diltiazem. There was no myocardial infarction. She experienced a recent episode of left upper  arm tightness, possibly related to shoulder issues. Continue diltiazem 180 mg daily. Follow up with a cardiologist later this week.  L flank pain  She has left-sided back pain radiating to the lower abdomen, DD: nephrolithiasis, back spasm, bowel, other(no rash of shingles). There is no hematuria or infection. The pain is constant , affecting mobility. She prefers to monitor symptoms rather than immediate  imaging. A urinalysis is ordered to check for blood or infection. Increase tamsulosin  to twice daily if needed. Monitor symptoms and consider a CT scan if pain worsens or hematuria develops.  Peripheral Edema   She has persistent peripheral edema, primarily in the ankles, since hospitalization. It is managed with furosemide . Continue furosemide  40 mg daily. Elevate legs to reduce swelling. Consider compression stockings if feasible.  Essential Hypertension   Her blood pressure is slightly elevated but reck normal. She is currently on diltiazem and furosemide . Previous lisinopril  was discontinued. Blood pressure is being monitored at home. Monitor blood pressure at home. Consider reintroducing lisinopril  if blood pressure remains elevated and no cough develops.  Psoriatic Arthritis   Psoriatic arthritis is managed with sulfasalazine . There was recent confusion regarding the medication list, but sulfasalazine  is continued for arthritis management. Continue sulfasalazine  2 tablets twice daily.  Type 2 Diabetes Mellitus   Type 2 diabetes is managed with insulin  and metformin . Recent medication list discrepancies were noted, but the insulin  regimen is confirmed. Continue the current insulin  and metformin  regimen.  Hypothyroidism   Hypothyroidism is managed with levothyroxine . There was recent confusion regarding dosage, but she is currently taking a full tablet despite the recommendation for a half tablet. Adjust levothyroxine  to half tablet as previously recommended.  Meds reconciled and changes as above.   Return in about 4 weeks (around 03/20/2024) for chronic follow-up.  Jenkins CHRISTELLA Carrel, MD

## 2024-02-21 NOTE — Patient Instructions (Signed)
 It was very nice to see you today!     PLEASE NOTE:  If you had any lab tests please let us  know if you have not heard back within a few days. You may see your results on MyChart before we have a chance to review them but we will give you a call once they are reviewed by us . If we ordered any referrals today, please let us  know if you have not heard from their office within the next week.   Please try these tips to maintain a healthy lifestyle:  Eat most of your calories during the day when you are active. Eliminate processed foods including packaged sweets (pies, cakes, cookies), reduce intake of potatoes, white bread, white pasta, and white rice. Look for whole grain options, oat flour or almond flour.  Each meal should contain half fruits/vegetables, one quarter protein, and one quarter carbs (no bigger than a computer mouse).  Cut down on sweet beverages. This includes juice, soda, and sweet tea. Also watch fruit intake, though this is a healthier sweet option, it still contains natural sugar! Limit to 3 servings daily.  Drink at least 1 glass of water with each meal and aim for at least 8 glasses per day  Exercise at least 150 minutes every week.

## 2024-02-22 ENCOUNTER — Ambulatory Visit: Payer: Self-pay | Admitting: Family Medicine

## 2024-02-22 LAB — COMPREHENSIVE METABOLIC PANEL WITH GFR
ALT: 13 U/L (ref 0–35)
AST: 19 U/L (ref 0–37)
Albumin: 4.2 g/dL (ref 3.5–5.2)
Alkaline Phosphatase: 104 U/L (ref 39–117)
BUN: 10 mg/dL (ref 6–23)
CO2: 30 meq/L (ref 19–32)
Calcium: 9.7 mg/dL (ref 8.4–10.5)
Chloride: 95 meq/L — ABNORMAL LOW (ref 96–112)
Creatinine, Ser: 0.59 mg/dL (ref 0.40–1.20)
GFR: 89.34 mL/min (ref >60.00)
Glucose, Bld: 75 mg/dL (ref 70–99)
Potassium: 3.5 meq/L (ref 3.5–5.1)
Sodium: 137 meq/L (ref 135–145)
Total Bilirubin: 0.3 mg/dL (ref 0.2–1.2)
Total Protein: 7.5 g/dL (ref 6.0–8.3)

## 2024-02-22 LAB — CBC WITH DIFFERENTIAL/PLATELET
Basophils Absolute: 0.1 K/uL (ref 0.0–0.1)
Basophils Relative: 0.8 % (ref 0.0–3.0)
Eosinophils Absolute: 0.3 K/uL (ref 0.0–0.7)
Eosinophils Relative: 3.1 % (ref 0.0–5.0)
HCT: 34.4 % — ABNORMAL LOW (ref 36.0–46.0)
Hemoglobin: 11.1 g/dL — ABNORMAL LOW (ref 12.0–15.0)
Lymphocytes Relative: 27 % (ref 12.0–46.0)
Lymphs Abs: 2.9 K/uL (ref 0.7–4.0)
MCHC: 32.2 g/dL (ref 30.0–36.0)
MCV: 83.2 fl (ref 78.0–100.0)
Monocytes Absolute: 0.7 K/uL (ref 0.1–1.0)
Monocytes Relative: 6.2 % (ref 3.0–12.0)
Neutro Abs: 6.7 K/uL (ref 1.4–7.7)
Neutrophils Relative %: 62.9 % (ref 43.0–77.0)
Platelets: 592 K/uL — ABNORMAL HIGH (ref 150.0–400.0)
RBC: 4.13 Mil/uL (ref 3.87–5.11)
RDW: 18.9 % — ABNORMAL HIGH (ref 11.5–15.5)
WBC: 10.7 K/uL — ABNORMAL HIGH (ref 4.0–10.5)

## 2024-02-22 NOTE — Progress Notes (Signed)
 Anemia some better but continue meds Potassium barely normal.  Needs to take the 10meq tabs daily

## 2024-02-23 NOTE — Progress Notes (Signed)
 Pt has read results smk

## 2024-02-24 ENCOUNTER — Ambulatory Visit (HOSPITAL_BASED_OUTPATIENT_CLINIC_OR_DEPARTMENT_OTHER): Admitting: Family

## 2024-02-24 ENCOUNTER — Other Ambulatory Visit (HOSPITAL_COMMUNITY): Payer: Self-pay

## 2024-02-24 ENCOUNTER — Encounter (HOSPITAL_BASED_OUTPATIENT_CLINIC_OR_DEPARTMENT_OTHER): Payer: Self-pay | Admitting: Family

## 2024-02-24 VITALS — BP 132/80 | HR 93 | Ht 61.5 in | Wt 203.9 lb

## 2024-02-24 DIAGNOSIS — I480 Paroxysmal atrial fibrillation: Secondary | ICD-10-CM

## 2024-02-24 DIAGNOSIS — I452 Bifascicular block: Secondary | ICD-10-CM

## 2024-02-24 DIAGNOSIS — I10 Essential (primary) hypertension: Secondary | ICD-10-CM

## 2024-02-24 DIAGNOSIS — Z8679 Personal history of other diseases of the circulatory system: Secondary | ICD-10-CM | POA: Diagnosis not present

## 2024-02-24 MED ORDER — DILTIAZEM HCL ER COATED BEADS 180 MG PO CP24
180.0000 mg | ORAL_CAPSULE | Freq: Every day | ORAL | 1 refills | Status: AC
  Filled 2024-02-24 – 2024-02-27 (×2): qty 90, 90d supply, fill #0

## 2024-02-24 NOTE — Patient Instructions (Addendum)
 Medication Instructions:  Continue your current medications.  We have sent a refill of Diltiazem  to [facility]   *If you need a refill on your cardiac medications before your next appointment, please call your pharmacy*  Follow-Up: At [facility], you and your health needs are our priority.  As part of our continuing mission to provide you with exceptional heart care, our providers are all part of one team.  This team includes your primary Cardiologist (physician) and Advanced Practice Providers or APPs (Physician Assistants and Nurse Practitioners) who all work together to provide you with the care you need, when you need it.  Your next appointment:   3 month(s)  Provider:   Shelda Bruckner, MD or Reche Finder, NP    We recommend signing up for the patient portal called MyChart.  Sign up information is provided on this After Visit Summary.  MyChart is used to connect with patients for Virtual Visits (Telemedicine).  Patients are able to view lab/test results, encounter notes, upcoming appointments, etc.  Non-urgent messages can be sent to your provider as well.   To learn more about what you can do with MyChart, go to [email or web address].   Other Instructions  Recommended putting heat pack on your left shoulder and left arm to help with pain.   To prevent or reduce lower extremity swelling: Eat a low salt diet. Salt makes the body hold onto extra fluid which causes swelling. Sit with legs elevated. For example, in the recliner or on an ottoman.  Wear knee-high compression stockings during the daytime. Ones labeled 15-20 mmHg provide good compression.

## 2024-02-24 NOTE — Progress Notes (Unsigned)
  Cardiology Office Note   Date:  02/24/2024  ID:  [name], [name] [birth year], MRN [id number] PCP: [name], MD  [facility] Providers Cardiologist:  Shelda Bruckner, MD { Click to update primary MD,subspecialty MD or APP then REFRESH:1}    History of Present Illness  Brittany Acosta is a 73 y.o. female ***RBBB/LAFB, hypothyroidism, pericarditis ***, PAF ***, bipolar 1, FM, HTN, GERD, DM2, obesity  Coronary CTA 09/2023 calcium  score of 0.636 placing her in the 32nd percentile with total plaque volume 14 mm cube.  She had minimal nonobstructive disease with less than 24% stenosis of the LAD.  She also had minimal aortic atherosclerosis.  There was a small circumferential pericardial effusion.  Admitted 9/4-9/29/25 with acute idiopathic pericarditis and pericardial effusion. Required pericardiocentesis 12/09/23. Initially treated with colchicine  which had to be discontinued 12/19/23 due to profound diarrhea, nausea, and vomiting. Repeat echo *** with resolution of effusion. She had brief episode of PAF treated with Amiodarone . She was not started on [facility] ***.   Admitted 10/31-11/1/25 with chest pain.  She was started on diltiazem  180 mg daily for possible coronary microvascular dysfunction due to MBF are 1.7.  PResents today for follow up with her spouse. She reports no chest pain, pressure, rightness. She has some left upper arm discomfort which is her dominant arm. She has not tried any OTC agents nor heat. It is most notable at night when laying down, she lays on her right side. She notes she is pending possible shoulder replacement. One xam she has no appreciable edema, however reports her right has terrible swelling. She is taking Lasix  40mg  daily as prescribed by her primary care provider. She has not been checking her BP at home. Since stopping LIsinopril , her cough is much improved.   ROS: Please see the history of present illness.    All other systems reviewed and are  negative.   Studies Reviewed      *** Risk Assessment/Calculations  CHA2DS2-VASc Score = 5  {Confirm score is correct.  If not, click here to update score.  REFRESH note.  :1} This indicates a 7.2% annual risk of stroke. The patient's score is based upon: CHF History: 0 HTN History: 1 Diabetes History: 1 Stroke History: 0 Vascular Disease History: 1 Age Score: 1 Gender Score: 1   {This patient has a significant risk of stroke if diagnosed with atrial fibrillation.  Please consider VKA or DOAC agent for anticoagulation if the bleeding risk is acceptable.   You can also use the SmartPhrase .[email or web address] for documentation.   :[id number]}         Physical Exam VS:  BP 132/80   Pulse 93   Ht 5' 1.5 (1.562 m)   Wt 203 lb 14.4 oz (92.5 kg)   SpO2 96%   BMI 37.90 kg/m        Wt Readings from Last 3 Encounters:  02/24/24 203 lb 14.4 oz (92.5 kg)  02/21/24 203 lb 8 oz (92.3 kg)  02/03/24 199 lb (90.3 kg)    GEN: Well nourished, well developed in no acute distress NECK: No JVD; No carotid bruits CARDIAC: ***RRR, no murmurs, rubs, gallops RESPIRATORY:  Clear to auscultation without rales, wheezing or rhonchi  ABDOMEN: Soft, non-tender, non-distended EXTREMITIES:  No edema; No deformity   ASSESSMENT AND PLAN ***       Dispo: ***  Signed, Reche GORMAN Finder, NP

## 2024-02-25 ENCOUNTER — Other Ambulatory Visit (HOSPITAL_COMMUNITY): Payer: Self-pay

## 2024-02-27 ENCOUNTER — Other Ambulatory Visit: Payer: Self-pay

## 2024-02-27 ENCOUNTER — Other Ambulatory Visit (HOSPITAL_COMMUNITY): Payer: Self-pay

## 2024-02-28 ENCOUNTER — Ambulatory Visit

## 2024-02-28 ENCOUNTER — Encounter: Payer: Self-pay | Admitting: Internal Medicine

## 2024-02-28 ENCOUNTER — Ambulatory Visit: Attending: Internal Medicine | Admitting: Internal Medicine

## 2024-02-28 VITALS — BP 189/86 | HR 75 | Temp 97.7°F | Resp 16 | Ht 61.5 in | Wt 199.2 lb

## 2024-02-28 DIAGNOSIS — M0600 Rheumatoid arthritis without rheumatoid factor, unspecified site: Secondary | ICD-10-CM | POA: Diagnosis not present

## 2024-02-28 DIAGNOSIS — L4050 Arthropathic psoriasis, unspecified: Secondary | ICD-10-CM | POA: Diagnosis not present

## 2024-02-28 DIAGNOSIS — M6788 Other specified disorders of synovium and tendon, other site: Secondary | ICD-10-CM | POA: Diagnosis not present

## 2024-02-28 NOTE — Progress Notes (Signed)
 Office Visit Note  Patient: Brittany Acosta             Date of Birth: [birth year]           MRN: [id number]             PCP: [name], MD Referring: [name], MD Visit Date: 02/28/2024 Occupation: Data Unavailable  Subjective:  New Patient (Initial Visit) and Joint Pain  Discussed the use of AI scribe software for clinical note transcription with the patient, who gave verbal consent to proceed.  History of Present Illness   Brittany Acosta is a 73 year old female with psoriatic arthritis who presents with worsening joint pain and swelling. She is accompanied by her husband, Rogue. She was previously seeing Dr. Curt from [facility] for evaluation of her arthritis and on treatment with sulfasalazine  1000 mg BID.  She has a history of psoriatic arthritis, initially diagnosed as rheumatoid arthritis, which was later changed to psoriatic arthritis about a year or two ago. She experiences significant swelling and pain in her hands, particularly in her fingers, making it difficult to perform daily activities. She is currently taking sulfasalazine  for her arthritis. She previously tried methotrexate but discontinued it due to mouth ulcers. She has not been on any other specific arthritis medications recently, as she declined injectable medications due to their high cost.  She was recently hospitalized for a pericardial effusion, which was discovered during pre-operative exams for planned shoulder surgery. She spent a month in the hospital but is no longer on colchicine , which she took for about a month.  She has a history of breast cancer, for which she underwent a lobectomy and local radiation on her left breast. She completed five years of anastrozole  treatment, which she stopped in May. She did not undergo systemic chemotherapy.  She reports swelling in her ankles, predominantly on the right side, and has had both knees replaced. She also mentions a history of  shoulder issues, having had two rotator cuff surgeries on her right shoulder, which is now worn out. She was planning a reverse shoulder replacement but postponed it due to her recent hospitalization.  She notes changes in her fingernails, which have become unusually hard, and reports that all her joints hurt, with her hands being the worst affected. She has not been using any topical treatments or additional medications for her arthritis recently.       Activities of Daily Living:  Patient reports morning stiffness for 1-2 hours.   Patient Reports nocturnal pain.  Difficulty dressing/grooming: Denies Difficulty climbing stairs: Reports Difficulty getting out of chair: Denies Difficulty using hands for taps, buttons, cutlery, and/or writing: Denies  Review of Systems  Constitutional:  Positive for fatigue.  HENT:  Positive for mouth sores and mouth dryness.   Eyes:  Negative for dryness.  Respiratory:  Negative for shortness of breath.   Cardiovascular:  Negative for chest pain and palpitations.  Gastrointestinal:  Positive for constipation. Negative for blood in stool and diarrhea.  Endocrine: Negative for increased urination.  Genitourinary:  Negative for involuntary urination.  Musculoskeletal:  Positive for joint pain, gait problem, joint pain, joint swelling, myalgias, muscle weakness, morning stiffness, muscle tenderness and myalgias.  Skin:  Positive for hair loss and sensitivity to sunlight. Negative for color change and rash.  Allergic/Immunologic: Negative for susceptible to infections.  Neurological:  Positive for dizziness and headaches.  Hematological:  Negative for swollen glands.  Psychiatric/Behavioral:  Positive for  sleep disturbance. Negative for depressed mood. The patient is nervous/anxious.     PMFS History:  Patient Active Problem List   Diagnosis Date Noted   Chest pain in adult 02/03/2024   Pleural effusion, bilateral 12/30/2023   Atrial flutter (HCC)  12/15/2023   Acute idiopathic pericarditis 12/15/2023   Pericardial effusion 12/08/2023   Hyponatremia 12/08/2023   Hypomagnesemia 09/09/2022   B12 deficiency 09/09/2022   Iron deficiency anemia due to chronic blood loss 08/10/2022   Psoriatic arthropathy (HCC) 09/20/2021   Carpal tunnel syndrome of left wrist 07/02/2020   Pain of left hand 07/02/2020   Rheumatoid arthritis (HCC) 05/07/2020   Bilateral leg edema 01/25/2020   Family history of heart disease 01/25/2020   Type 2 diabetes mellitus with obesity 01/25/2020   Right carotid bruit 12/25/2018   Stress incontinence in female 12/06/2018   Antalgic gait 12/02/2018   Malignant neoplasm of upper-outer quadrant of left breast in female, estrogen receptor positive (HCC) 05/10/2018   Temporomandibular joint (TMJ) pain 03/23/2018   Tophus of toe concurrent with and due to gout 12/16/2017   Hypotension 03/25/2017   Mass in the abdomen 03/17/2017   Degenerative lumbar spinal stenosis 02/10/2017   Spondylolisthesis at L4-L5 level 02/10/2017   Cervical radiculopathy 01/18/2017   Nausea 12/23/2016   Depression, major, recurrent, moderate (HCC) 11/19/2016   Diabetic neuropathy, painful (HCC) 11/19/2016   Hypothyroidism, 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/19/2016   OSA (obstructive sleep apnea) 11/19/2016   PTSD (post-traumatic stress disorder) 11/19/2016   Arthritis 11/19/2016   Chronic low back pain 11/19/2016   Gout, 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   Poorly controlled type 2 diabetes mellitus with circulatory disorder (HCC) 06/10/2015   Family history of early CAD 04/18/2015   Right bundle branch block 04/18/2015   Bifascicular block 04/18/2015   Radiculopathy, lumbar region 05/31/2014   Greater trochanteric bursitis of left hip 04/22/2014   Achilles tendinosis 02/19/2014   Gait instability 11/14/2013   Recurrent falls 11/14/2013   Multiple fractures 11/14/2013   Kidney stones 07/25/2013   Hypercalciuria 07/25/2013   Pain in joint involving ankle and foot  07/02/2013   Peripheral polyneuropathy 09/13/2012   Abnormal mammogram 07/28/2012   Migraine 07/10/2012   Osteoarthritis of right knee 02/10/2012   Osteoarthritis of left knee 06/21/2011   GERD (gastroesophageal reflux disease) 02/09/2011   Dyspnea 02/09/2011   Vitamin D  deficiency 08/27/2010   Fibromyalgia 03/23/2010   Goiter 03/24/2009   Nontoxic multinodular goiter 03/24/2009   Low serum potassium level 09/26/2008   Hypocitraturia 09/06/2008   Obesity, morbid (HCC) 09/03/2008   Hypothyroidism 06/10/2008   Hypercholesterolemia 06/10/2008   Bipolar 1 disorder (HCC) 06/10/2008   Hypertension associated with diabetes (HCC) 06/10/2008   Allergic rhinitis 06/10/2008   Asymptomatic postmenopausal status 06/10/2008    Past Medical History:  Diagnosis Date   Allergic rhinitis, cause unspecified    Arthritis    Bipolar disorder, unspecified (HCC)    depression   Cancer (HCC)    skin cancer on left leg, left breast cancer   Cataract    Chronic diastolic heart failure (HCC)    Depression    GERD (gastroesophageal reflux disease)    Headache    History of gout    haven't had it in several years (02/08/2012)   History of kidney stones    History of wrist fracture    rt wrist   Hypertension    Iron deficiency anemia    Morbid obesity (HCC)    Neuropathy due to secondary diabetes (HCC)  Nontoxic multinodular goiter    Osteoporosis    Personal history of radiation therapy    Pneumonia    PONV (postoperative nausea and vomiting)    Psoriatic arthritis (HCC)    PTSD (post-traumatic stress disorder)    Pure hypercholesterolemia    RBBB    Scleroderma (HCC)    Sleep apnea 10/2012   mild osa-did not need cpap -dr clance   Type II diabetes mellitus (HCC)    Unspecified hypothyroidism     Family History  Problem Relation Age of Onset   Hypertension Mother    Lung cancer Mother        non-smoker   Diabetes Father    Hypertension Father    Hyperlipidemia Father     Hypertension Sister    Hypertension Sister    Hypertension Sister    Hypertension Brother    Hypertension Brother    Hypertension Brother    Stomach cancer Maternal Grandmother    Diabetes Daughter    Heart attack Other    Coronary artery disease Other    Diabetes Son    Colon cancer Neg Hx        unsure   Esophageal cancer Neg Hx    Liver cancer Neg Hx    Pancreatic cancer Neg Hx    Rectal cancer Neg Hx    Colon polyps Neg Hx    Past Surgical History:  Procedure Laterality Date   ABDOMINAL HYSTERECTOMY  1976   BREAST EXCISIONAL BIOPSY Left    BREAST LUMPECTOMY Left 04/25/2018   BREAST LUMPECTOMY WITH RADIOACTIVE SEED AND SENTINEL LYMPH NODE BIOPSY Left 04/25/2018   Procedure: LEFT BREAST LUMPECTOMY WITH RADIOACTIVE SEED AND SENTINEL LYMPH NODE BIOPSY;  Surgeon: Mikell Katz, MD;  Location: [facility] OR;  Service: General;  Laterality: Left;   BREAST SURGERY     CARDIAC CATHETERIZATION  2001   sees Dr Peter Jordan   CATARACT EXTRACTION  2014   CATARACT EXTRACTION, BILATERAL  02/2011   epps   CHOLECYSTECTOMY  1985   ESOPHAGOGASTRODUODENOSCOPY (EGD) WITH PROPOFOL  N/A 10/14/2022   Procedure: ESOPHAGOGASTRODUODENOSCOPY (EGD) WITH PROPOFOL ;  Surgeon: Wilhelmenia Aloha Raddle., MD;  Location: [facility];  Service: Gastroenterology;  Laterality: N/A;   EUS N/A 10/14/2022   Procedure: UPPER ENDOSCOPIC ULTRASOUND (EUS) RADIAL;  Surgeon: Wilhelmenia Aloha Raddle., MD;  Location: [facility];  Service: Gastroenterology;  Laterality: N/A;   EXCISIONAL HEMORRHOIDECTOMY     dr cut out in his office (02/08/2012)   INCISIONAL BREAST BIOPSY  2000   right   JOINT REPLACEMENT     rt knee   KNEE ARTHROSCOPY  08/2009   right   KNEE ARTHROSCOPY WITH MEDIAL MENISECTOMY     left   Left Cystoscopy   1990   LEFT OOPHORECTOMY  1980   Lithotripsy (L) Kidney  1997   LUMBAR LAMINECTOMY/DECOMPRESSION MICRODISCECTOMY Left 07/01/2016   Procedure: Laminectomy for synovial cyst - left - Lumbar  four-five;  Surgeon: Victory Gunnels, MD;  Location: [facility] OR;  Service: Neurosurgery;  Laterality: Left;   PARTIAL MASTECTOMY WITH NEEDLE LOCALIZATION Left 09/01/2012   Procedure: PARTIAL MASTECTOMY WITH NEEDLE LOCALIZATION;  Surgeon: Elon CHRISTELLA Pacini, MD;  Location: [facility] OR;  Service: General;  Laterality: Left;   Percitania stone removed (L) Kidney  1992   PERICARDIOCENTESIS N/A 12/09/2023   Procedure: PERICARDIOCENTESIS;  Surgeon: Wonda Sharper, MD;  Location: [facility] INVASIVE CV LAB;  Service: Cardiovascular;  Laterality: N/A;   REVERSE SHOULDER ARTHROPLASTY Right 02/12/2021   Procedure: REVERSE SHOULDER ARTHROPLASTY;  Surgeon:  Dozier Soulier, MD;  Location: [facility];  Service: Orthopedics;  Laterality: Right;   Right nasal surgery  08/1988   Right sinus removed  08/1989   tooth partial   ROTATOR CUFF REPAIR  2013   right shoulder x 2   SHOULDER ARTHROSCOPY WITH ROTATOR CUFF REPAIR AND SUBACROMIAL DECOMPRESSION Left 08/20/2013   Procedure: SHOULDER ARTHROSCOPY  AND SUBACROMIAL DECOMPRESSION;  Surgeon: Soulier Elsie Dozier, MD;  Location: [facility];  Service: Orthopedics;  Laterality: Left;  Left shoulder arthroscopy, debridement, subacromial decompression, distal clavical resection   THYROIDECTOMY  04/22/2011   Procedure: THYROIDECTOMY;  Surgeon: Vaughan JONETTA Ricker, MD;  [facility];  Service: ENT;  Laterality: N/A;  TOTAL THYROIDECOTMY   TOTAL KNEE ARTHROPLASTY  06/18/2011   Procedure: TOTAL KNEE ARTHROPLASTY;  Surgeon: Dempsey JINNY Sensor, MD;  Location: [facility] OR;  Service: Orthopedics;  Laterality: Left;  DEPUY   TOTAL KNEE ARTHROPLASTY  02/07/2012   Procedure: TOTAL KNEE ARTHROPLASTY;  Surgeon: Dempsey JINNY Sensor, MD;  Location: [facility] OR;  Service: Orthopedics;  Laterality: Right;   TUBAL LIGATION  1972   Social History   Tobacco Use   Smoking status: Former    Current packs/day: 0.00    Average packs/day: 1 pack/day for 6.0 years (6.0 ttl pk-yrs)    Types: Cigarettes    Start date:  04/05/1977    Quit date: 04/06/1983    Years since quitting: 40.9    Passive exposure: Never   Smokeless tobacco: Never   Tobacco comments:    Married, lives with spouse. Disable- 2 grown kids-6 g-kids  Vaping Use   Vaping status: Never Used  Substance Use Topics   Alcohol use: No    Comment: none   Drug use: No   Social History   Social History Narrative   Married, lives with spouse - 2 adult children   Disabled -- started around 2000 for mental health reasons, Bipolar Disorder/PTSD   Husband works     Immunization History  Administered Date(s) Administered   Fluad Quad(high Dose 65+) 01/22/2021, 12/30/2021, 12/20/2023   INFLUENZA, HIGH DOSE SEASONAL PF 12/06/2012, 12/18/2014, 12/15/2015, 12/15/2016, 01/07/2018   Influenza Split 01/08/2008, 01/13/2009, 04/23/2011, 01/05/2012   Influenza Whole 01/08/2008, 01/13/2009   Influenza, Quadrivalent, Recombinant, Inj, Pf 12/12/2017   Influenza,inj,Quad PF,6+ Mos 12/06/2012, 12/18/2014   Influenza,trivalent, recombinat, inj, PF 04/23/2011, 01/05/2012   Influenza-Unspecified 04/23/2011, 01/05/2012, 12/06/2012, 12/18/2014, 01/16/2020   PFIZER(Purple Top)SARS-COV-2 Vaccination 06/14/2019, 07/10/2019, 12/21/2019   Pneumococcal Conjugate-13 08/08/2017   Pneumococcal Polysaccharide-23 04/23/2011, 06/24/2020   Td 04/05/2004, 04/30/2013, 09/14/2017   Td (Adult),5 Lf Tetanus Toxid, Preservative Free 04/05/2004, 04/30/2013, 09/14/2017   Tdap 09/13/2017   Zoster Recombinant(Shingrix) 09/14/2017, 12/02/2017   Zoster, Live 09/07/2013   Zoster, Unspecified 12/02/2017     Objective: Vital Signs: BP (!) 189/86 (BP Location: Left Arm, Patient Position: Sitting, Cuff Size: Normal)   Pulse 75   Temp 97.7 F (36.5 C)   Resp 16   Ht 5' 1.5 (1.562 m)   Wt 199 lb 3.2 oz (90.4 kg)   BMI 37.03 kg/m    Physical Exam Constitutional:      Appearance: She is obese.  HENT:     Mouth/Throat:     Mouth: Mucous membranes are moist.     Pharynx:  Oropharynx is clear.  Eyes:     Conjunctiva/sclera: Conjunctivae normal.  Cardiovascular:     Rate and Rhythm: Normal rate and regular rhythm.  Pulmonary:     Effort: Pulmonary effort is normal.  Breath sounds: Normal breath sounds.  Lymphadenopathy:     Cervical: No cervical adenopathy.  Skin:    General: Skin is warm and dry.     Comments: Edema localized to ankle and foot b/l  Neurological:     Mental Status: She is alert.  Psychiatric:        Mood and Affect: Mood normal.      Musculoskeletal Exam:  Shoulders full ROM no tenderness or swelling Elbows full ROM no tenderness or swelling Wrists full ROM no tenderness or swelling Fingers heberdon's nodes, worst deformities 2nd-3rd DIPs with some lateral deviation and erythema, no palpable synovitis, no dactylitis Knees full ROM postoperative changes, right knee some laxity/crepitus with movement, no effusions Ankles full ROM no tenderness or swelling, nontender nodules or swelling on posterior of achilles tendon b/l   Investigation: No additional findings.  Imaging: DG Chest 2 View Result Date: 02/03/2024 CLINICAL DATA:  chest pain EXAM: CHEST - 2 VIEW COMPARISON:  12/29/2023 FINDINGS: No focal airspace consolidation, pleural effusion, or pneumothorax. No cardiomegaly. Tortuous aorta with aortic atherosclerosis. No acute fracture or destructive lesions. Multilevel thoracic osteophytosis. Right glenohumeral joint arthroplasty. IMPRESSION: No acute cardiopulmonary abnormality. Electronically Signed   By: Rogelia Myers M.D.   On: 02/03/2024 12:44    Recent Labs: Lab Results  Component Value Date   WBC 10.7 (H) 02/21/2024   HGB 11.1 (L) 02/21/2024   PLT 592.0 (H) 02/21/2024   NA 137 02/21/2024   K 3.5 02/21/2024   CL 95 (L) 02/21/2024   CO2 30 02/21/2024   GLUCOSE 75 02/21/2024   BUN 10 02/21/2024   CREATININE 0.59 02/21/2024   BILITOT 0.3 02/21/2024   ALKPHOS 104 02/21/2024   AST 19 02/21/2024   ALT 13  02/21/2024   PROT 7.5 02/21/2024   ALBUMIN  4.2 02/21/2024   CALCIUM  9.7 02/21/2024   GFRAA >60 09/14/2019    Speciality Comments: No specialty comments available.  Procedures:  No procedures performed Allergies: Patient has no known allergies.   Assessment / Plan:     Visit Diagnoses: Psoriatic arthropathy (HCC) - Plan: Sedimentation rate, C-reactive protein, XR Hand 2 View Right, XR Hand 2 View Left, XR Foot 2 Views Left, XR Foot 2 Views Right Psoriatic arthritis with significant joint inflammation and functional impairment, primarily affecting hands and ankles. Current sulfasalazine  treatment insufficient. On 500 mg x4 daily. Differential includes osteoarthritis. Consider leflunomide  or Otezla if inflammation markers high or erosive damage present. - Ordered x-rays of hands and feet to assess joint damage. - Ordered blood tests to evaluate inflammation markers. - Provided information on leflunomide  and Otezla as potential treatment options.  Rheumatoid arthritis with negative rheumatoid factor, involving unspecified site (HCC) Overall picture would be more suggestive to me for psoriatic arthritis given seronegative disease make sure there are no specific characteristics on x-ray imaging.  History of breast cancer, left breast, status post lobectomy and radiation Breast cancer treated with lobectomy and radiation. Anastrozole  discontinued in May after five years. No current issues related to breast cancer. - Reviewed bone density test results from last year.        Orders: Orders Placed This Encounter  Procedures   XR Hand 2 View Right   XR Hand 2 View Left   XR Foot 2 Views Left   XR Foot 2 Views Right   Sedimentation rate   C-reactive protein   No orders of the defined types were placed in this encounter.   Follow-Up Instructions: Return in about 4 weeks (  around 03/27/2024) for New pt PsA/?RA on SSZ f/u 42mo.   Lonni LELON Ester, MD  Note - This record has been  created using [employer].  Chart creation errors have been sought, but may not always  have been located. Such creation errors do not reflect on  the standard of medical care.

## 2024-02-28 NOTE — Patient Instructions (Signed)
 Brittany Acosta

## 2024-02-29 LAB — C-REACTIVE PROTEIN: CRP: 12.3 mg/L — ABNORMAL HIGH (ref <8.0)

## 2024-02-29 LAB — SEDIMENTATION RATE: Sed Rate: 28 mm/h (ref 0–30)

## 2024-03-06 ENCOUNTER — Ambulatory Visit: Admitting: Physician Assistant

## 2024-03-19 ENCOUNTER — Other Ambulatory Visit (HOSPITAL_COMMUNITY): Payer: Self-pay

## 2024-03-20 ENCOUNTER — Encounter: Payer: Self-pay | Admitting: Family Medicine

## 2024-03-20 ENCOUNTER — Ambulatory Visit: Admitting: Family Medicine

## 2024-03-20 ENCOUNTER — Other Ambulatory Visit (HOSPITAL_COMMUNITY): Payer: Self-pay

## 2024-03-20 VITALS — BP 126/74 | HR 82 | Temp 97.3°F | Ht 61.5 in | Wt 201.1 lb

## 2024-03-20 DIAGNOSIS — I10 Essential (primary) hypertension: Secondary | ICD-10-CM

## 2024-03-20 DIAGNOSIS — Z794 Long term (current) use of insulin: Secondary | ICD-10-CM

## 2024-03-20 DIAGNOSIS — E7800 Pure hypercholesterolemia, unspecified: Secondary | ICD-10-CM

## 2024-03-20 DIAGNOSIS — E1159 Type 2 diabetes mellitus with other circulatory complications: Secondary | ICD-10-CM

## 2024-03-20 DIAGNOSIS — E890 Postprocedural hypothyroidism: Secondary | ICD-10-CM | POA: Diagnosis not present

## 2024-03-20 DIAGNOSIS — E1165 Type 2 diabetes mellitus with hyperglycemia: Secondary | ICD-10-CM | POA: Diagnosis not present

## 2024-03-20 DIAGNOSIS — Z7984 Long term (current) use of oral hypoglycemic drugs: Secondary | ICD-10-CM

## 2024-03-20 DIAGNOSIS — L4050 Arthropathic psoriasis, unspecified: Secondary | ICD-10-CM

## 2024-03-20 DIAGNOSIS — E538 Deficiency of other specified B group vitamins: Secondary | ICD-10-CM

## 2024-03-20 LAB — CBC WITH DIFFERENTIAL/PLATELET
Basophils Absolute: 0 K/uL (ref 0.0–0.1)
Basophils Relative: 0.6 % (ref 0.0–3.0)
Eosinophils Absolute: 0.3 K/uL (ref 0.0–0.7)
Eosinophils Relative: 4.6 % (ref 0.0–5.0)
HCT: 34.7 % — ABNORMAL LOW (ref 36.0–46.0)
Hemoglobin: 11.4 g/dL — ABNORMAL LOW (ref 12.0–15.0)
Lymphocytes Relative: 35.5 % (ref 12.0–46.0)
Lymphs Abs: 2.5 K/uL (ref 0.7–4.0)
MCHC: 32.9 g/dL (ref 30.0–36.0)
MCV: 82.4 fl (ref 78.0–100.0)
Monocytes Absolute: 0.4 K/uL (ref 0.1–1.0)
Monocytes Relative: 5.8 % (ref 3.0–12.0)
Neutro Abs: 3.7 K/uL (ref 1.4–7.7)
Neutrophils Relative %: 53.5 % (ref 43.0–77.0)
Platelets: 440 K/uL — ABNORMAL HIGH (ref 150.0–400.0)
RBC: 4.21 Mil/uL (ref 3.87–5.11)
RDW: 17.8 % — ABNORMAL HIGH (ref 11.5–15.5)
WBC: 7 K/uL (ref 4.0–10.5)

## 2024-03-20 LAB — COMPREHENSIVE METABOLIC PANEL WITH GFR
ALT: 12 U/L (ref 3–35)
AST: 20 U/L (ref 5–37)
Albumin: 3.9 g/dL (ref 3.5–5.2)
Alkaline Phosphatase: 100 U/L (ref 39–117)
BUN: 15 mg/dL (ref 6–23)
CO2: 30 meq/L (ref 19–32)
Calcium: 9.4 mg/dL (ref 8.4–10.5)
Chloride: 99 meq/L (ref 96–112)
Creatinine, Ser: 0.6 mg/dL (ref 0.40–1.20)
GFR: 88.93 mL/min (ref >60.00)
Glucose, Bld: 68 mg/dL — ABNORMAL LOW (ref 70–99)
Potassium: 4 meq/L (ref 3.5–5.1)
Sodium: 138 meq/L (ref 135–145)
Total Bilirubin: 0.2 mg/dL (ref 0.2–1.2)
Total Protein: 7 g/dL (ref 6.0–8.3)

## 2024-03-20 LAB — TSH: TSH: 5.71 u[IU]/mL — ABNORMAL HIGH (ref 0.35–5.50)

## 2024-03-20 LAB — T4, FREE: Free T4: 0.81 ng/dL (ref 0.60–1.60)

## 2024-03-20 LAB — MICROALBUMIN / CREATININE URINE RATIO
Creatinine,U: 49 mg/dL
Microalb Creat Ratio: 52.7 mg/g — ABNORMAL HIGH (ref 0.0–30.0)
Microalb, Ur: 2.6 mg/dL — ABNORMAL HIGH (ref 0.7–1.9)

## 2024-03-20 LAB — T3, FREE: T3, Free: 3 pg/mL (ref 2.3–4.2)

## 2024-03-20 LAB — HEMOGLOBIN A1C: Hgb A1c MFr Bld: 6.2 % (ref 4.6–6.5)

## 2024-03-20 MED ORDER — DOXEPIN HCL 10 MG PO CAPS
10.0000 mg | ORAL_CAPSULE | Freq: Every day | ORAL | 1 refills | Status: AC
  Filled 2024-03-20: qty 90, 90d supply, fill #0

## 2024-03-20 MED ORDER — SERTRALINE HCL 100 MG PO TABS
100.0000 mg | ORAL_TABLET | Freq: Every day | ORAL | 1 refills | Status: AC
  Filled 2024-03-20 – 2024-04-27 (×2): qty 90, 90d supply, fill #0

## 2024-03-20 MED ORDER — CYCLOBENZAPRINE HCL 10 MG PO TABS
10.0000 mg | ORAL_TABLET | Freq: Every evening | ORAL | 0 refills | Status: AC | PRN
  Filled 2024-03-20: qty 90, 90d supply, fill #0

## 2024-03-20 MED ORDER — POTASSIUM CHLORIDE CRYS ER 20 MEQ PO TBCR
20.0000 meq | EXTENDED_RELEASE_TABLET | Freq: Every day | ORAL | 0 refills | Status: AC
  Filled 2024-03-20 – 2024-04-23 (×2): qty 90, 90d supply, fill #0

## 2024-03-20 MED ORDER — FOLIC ACID 1 MG PO TABS
2.0000 mg | ORAL_TABLET | Freq: Every morning | ORAL | 0 refills | Status: AC
  Filled 2024-03-20: qty 180, 90d supply, fill #0

## 2024-03-20 MED ORDER — METFORMIN HCL 500 MG PO TABS
ORAL_TABLET | ORAL | 0 refills | Status: AC
  Filled 2024-03-20 – 2024-05-03 (×2): qty 270, 90d supply, fill #0

## 2024-03-20 MED ORDER — LEVOTHYROXINE SODIUM 75 MCG PO TABS
37.5000 ug | ORAL_TABLET | Freq: Every day | ORAL | 1 refills | Status: AC
  Filled 2024-03-20: qty 45, 90d supply, fill #0

## 2024-03-20 NOTE — Patient Instructions (Addendum)
 Try aleve  twice/day for few days  Your Vitamin B12 is low or low normal-please take B12 vitamins 2000mcg(B complex vitamins are fine as well).

## 2024-03-20 NOTE — Progress Notes (Signed)
 Subjective:     Patient ID: Brittany Acosta, female    DOB: October 19, 1950, 73 y.o.   MRN: [id number]  Chief Complaint  Patient presents with   Diabetes   Hypertension    Pt is here to go over chronic issues    Discussed the use of AI scribe software for clinical note transcription with the patient, who gave verbal consent to proceed.  History of Present Illness Brittany Acosta is a 73 year old female with hypertension and psoriatic arthritis who presents for chronic follow-up. She is accompanied by her husband.  She manages her hypertension with diltiazem  180 mg and furosemide  40 mg. Her cough resolved after discontinuing lisinopril . She has not been checking her blood pressure at home as her husband was supposed to bring the monitor. No heart fluttering or chest pains.  She experiences significant pain in her hands, described as sharp pain in the fingers, with associated swelling. She has a history of psoriatic arthritis and osteoarthritis. She is currently taking sulfasalazine . She saw rheum and X-rays of her hands have been taken, but results are pending. Recent blood work showed an elevated CRP, but her sed rate was normal. She uses magnesium  cream and compression gloves for relief, but these provide only short-term relief. She has not been taking ibuprofen  or similar medications due to concerns about her heart and kidneys.  Her diabetes is managed with Tresiba  23 units daily and metformin   (1000 mg in the morning and 500 mg at night). Her blood sugars have been stable recently.  She is also taking pravastatin  for cholesterol - no issues  lamotrigine  for mood stabilization-doing well.  No SI ,  She has a history of hypothyroidism and is currently on a reduced dose of Synthroid  (half of a 75 mcg pill) since her hospital discharge on November 1st.  She is also taking allopurinol , aspirin , vitamin D , cyclobenzaprine  as needed, doxepin  for sleep, folic acid , and magnesium . She has a history of  low B12 levels, which she is not currently supplementing.    Health Maintenance Due  Topic Date Due   Medicare Annual Wellness (AWV)  09/04/2020   OPHTHALMOLOGY EXAM  06/07/2023   Bone Density Scan  06/17/2023   FOOT EXAM  12/10/2023    Past Medical History:  Diagnosis Date   Allergic rhinitis, cause unspecified    Arthritis    Bipolar disorder, unspecified (HCC)    depression   Cancer (HCC)    skin cancer on left leg, left breast cancer   Cataract    Chronic diastolic heart failure (HCC)    Depression    GERD (gastroesophageal reflux disease)    Headache    History of gout    haven't had it in several years (02/08/2012)   History of kidney stones    History of wrist fracture    rt wrist   Hypertension    Iron deficiency anemia    Morbid obesity (HCC)    Neuropathy due to secondary diabetes (HCC)    Nontoxic multinodular goiter    Osteoporosis    Personal history of radiation therapy    Pneumonia    PONV (postoperative nausea and vomiting)    Psoriatic arthritis (HCC)    PTSD (post-traumatic stress disorder)    Pure hypercholesterolemia    RBBB    Scleroderma (HCC)    Sleep apnea 10/2012   mild osa-did not need cpap -dr clance   Type II diabetes mellitus (HCC)    Unspecified hypothyroidism  Past Surgical History:  Procedure Laterality Date   ABDOMINAL HYSTERECTOMY  1976   BREAST EXCISIONAL BIOPSY Left    BREAST LUMPECTOMY Left 04/25/2018   BREAST LUMPECTOMY WITH RADIOACTIVE SEED AND SENTINEL LYMPH NODE BIOPSY Left 04/25/2018   Procedure: LEFT BREAST LUMPECTOMY WITH RADIOACTIVE SEED AND SENTINEL LYMPH NODE BIOPSY;  Surgeon: Mikell Katz, MD;  Location: [facility] OR;  Service: General;  Laterality: Left;   BREAST SURGERY     CARDIAC CATHETERIZATION  2001   sees Dr Peter Jordan   CATARACT EXTRACTION  2014   CATARACT EXTRACTION, BILATERAL  02/2011   epps   CHOLECYSTECTOMY  1985   ESOPHAGOGASTRODUODENOSCOPY (EGD) WITH PROPOFOL  N/A 10/14/2022   Procedure:  ESOPHAGOGASTRODUODENOSCOPY (EGD) WITH PROPOFOL ;  Surgeon: Wilhelmenia Aloha Raddle., MD;  Location: [facility];  Service: Gastroenterology;  Laterality: N/A;   EUS N/A 10/14/2022   Procedure: UPPER ENDOSCOPIC ULTRASOUND (EUS) RADIAL;  Surgeon: Wilhelmenia Aloha Raddle., MD;  Location: [facility];  Service: Gastroenterology;  Laterality: N/A;   EXCISIONAL HEMORRHOIDECTOMY     dr cut out in his office (02/08/2012)   INCISIONAL BREAST BIOPSY  2000   right   JOINT REPLACEMENT     rt knee   KNEE ARTHROSCOPY  08/2009   right   KNEE ARTHROSCOPY WITH MEDIAL MENISECTOMY     left   Left Cystoscopy   1990   LEFT OOPHORECTOMY  1980   Lithotripsy (L) Kidney  1997   LUMBAR LAMINECTOMY/DECOMPRESSION MICRODISCECTOMY Left 07/01/2016   Procedure: Laminectomy for synovial cyst - left - Lumbar four-five;  Surgeon: Victory Gunnels, MD;  Location: [facility] OR;  Service: Neurosurgery;  Laterality: Left;   PARTIAL MASTECTOMY WITH NEEDLE LOCALIZATION Left 09/01/2012   Procedure: PARTIAL MASTECTOMY WITH NEEDLE LOCALIZATION;  Surgeon: Elon CHRISTELLA Pacini, MD;  Location: [facility] OR;  Service: General;  Laterality: Left;   Percitania stone removed (L) Kidney  1992   PERICARDIOCENTESIS N/A 12/09/2023   Procedure: PERICARDIOCENTESIS;  Surgeon: Wonda Sharper, MD;  Location: [facility];  Service: Cardiovascular;  Laterality: N/A;   REVERSE SHOULDER ARTHROPLASTY Right 02/12/2021   Procedure: REVERSE SHOULDER ARTHROPLASTY;  Surgeon: Dozier Soulier, MD;  Location: [facility];  Service: Orthopedics;  Laterality: Right;   Right nasal surgery  08/1988   Right sinus removed  08/1989   tooth partial   ROTATOR CUFF REPAIR  2013   right shoulder x 2   SHOULDER ARTHROSCOPY WITH ROTATOR CUFF REPAIR AND SUBACROMIAL DECOMPRESSION Left 08/20/2013   Procedure: SHOULDER ARTHROSCOPY  AND SUBACROMIAL DECOMPRESSION;  Surgeon: Soulier Elsie Dozier, MD;  Location: [facility];  Service: Orthopedics;  Laterality: Left;  Left shoulder  arthroscopy, debridement, subacromial decompression, distal clavical resection   THYROIDECTOMY  04/22/2011   Procedure: THYROIDECTOMY;  Surgeon: Vaughan JONETTA Ricker, MD;  Location: [facility] OR;  Service: ENT;  Laterality: N/A;  TOTAL THYROIDECOTMY   TOTAL KNEE ARTHROPLASTY  06/18/2011   Procedure: TOTAL KNEE ARTHROPLASTY;  Surgeon: Dempsey JINNY Sensor, MD;  Location: [facility] OR;  Service: Orthopedics;  Laterality: Left;  DEPUY   TOTAL KNEE ARTHROPLASTY  02/07/2012   Procedure: TOTAL KNEE ARTHROPLASTY;  Surgeon: Dempsey JINNY Sensor, MD;  Location: [facility] OR;  Service: Orthopedics;  Laterality: Right;   TUBAL LIGATION  1972    Current Medications[1]  Allergies[2] ROS neg/noncontributory except as noted HPI/below      Objective:     BP 126/74 (BP Location: Left Arm, Patient Position: Sitting)   Pulse 82   Temp (!) 97.3 F (36.3 C) (Temporal)   Ht 5'  1.5 (1.562 m)   Wt 201 lb 2 oz (91.2 kg)   SpO2 97%   BMI 37.39 kg/m  Wt Readings from Last 3 Encounters:  03/20/24 201 lb 2 oz (91.2 kg)  02/28/24 199 lb 3.2 oz (90.4 kg)  02/24/24 203 lb 14.4 oz (92.5 kg)    Physical Exam GENERAL: Well developed, well nourished, no acute distress. HEAD EYES EARS NOSE THROAT: Normocephalic, atraumatic, conjunctiva not injected, sclera nonicteric. CARDIAC: Ectopic beats present, regular rate and rhythm, S1 S2 present, no murmur, dorsalis pedis 1 plus bilaterally. NECK: Supple, no thyromegaly, no nodes, no carotid bruits. LUNGS: Clear to auscultation bilaterally, no wheezes. ABDOMEN: Bowel sounds present, soft, non-tender, non-distended, no hepatosplenomegaly, no masses. EXTREMITIES: minimal Swelling present in ankles. MUSCULOSKELETAL: No gross abnormalities. NEUROLOGICAL: Alert and oriented x3, cranial nerves II through XII intact. PSYCHIATRIC: Normal mood, good eye contact.       Assessment & Plan:  Hypothyroidism, postsurgical -     TSH -     T4, free -     T3, free  Essential hypertension -     CBC with  Differential/Platelet -     Comprehensive metabolic panel with GFR  Poorly controlled type 2 diabetes mellitus with circulatory disorder (HCC) -     Hemoglobin A1c -     Microalbumin / creatinine urine ratio -     Comprehensive metabolic panel with GFR  Hypercholesterolemia  B12 deficiency  Psoriatic arthropathy (HCC)  Long term current use of oral hypoglycemic drug  Encounter for long-term (current) use of insulin  (HCC)  Other orders -     Cyclobenzaprine  HCl; Take 1 tablet (10 mg) by mouth at bedtime as needed for muscle spasms.  Dispense: 90 tablet; Refill: 0 -     Doxepin  HCl; Take 1 capsule (10 mg total) by mouth daily.  Dispense: 90 capsule; Refill: 1 -     Folic Acid ; Take 2 tablets (2 mg total) by mouth in the morning.  Dispense: 180 tablet; Refill: 0 -     Levothyroxine  Sodium; Take 0.5 tablets (37.5 mcg total) by mouth daily.  Dispense: 90 tablet; Refill: 1 -     metFORMIN  HCl; Take 2 tablets (1,000 mg total) by mouth every morning AND 1 tablet (500 mg total) every evening.  Dispense: 270 tablet; Refill: 0 -     Potassium Chloride  Crys ER; Take 1 tablet (20 mEq total) by mouth daily.  Dispense: 90 tablet; Refill: 0 -     Sertraline  HCl; Take 1 tablet (100 mg total) by mouth at bedtime.  Dispense: 90 tablet; Refill: 1    Assessment and Plan Assessment & Plan Psoriatic arthritis and hand osteoarthritis   She experiences chronic pain and swelling in her hands, especially in the crooked fingers, with elevated CRP indicating inflammation. The differential diagnosis includes psoriatic arthritis and rheumatoid arthritis and OA. X-ray results and further evaluation by a rheumatologist are pending. Current management includes sulfasalazine  and allopurinol . Pain management is challenging due to potential kidney and heart concerns with NSAIDs. Continue sulfasalazine  and allopurinol . Try Aleve  twice daily for a few days for pain management, monitoring for increased leg swelling or fluid  retention. Follow up with the rheumatologist for x-ray results and further management.  Type 2 diabetes mellitus with circulatory complications   Her blood sugar levels are well-controlled with Tresiba  and metformin . No recent episodes of hypoglycemia reported. Continue Tresiba  23 units daily and metformin  1000 mg in the morning and 500 mg at night. Metformin  prescription refilled.  Essential hypertension   Blood pressure is managed with diltiazem  and furosemide . No recent home blood pressure monitoring reported. She has no symptoms of heart fluttering or chest pain. Encourage home blood pressure monitoring. Continue diltiazem  and furosemide .  Postprocedural hypothyroidism   Her thyroid  dose was adjusted to 37.5 mcg post-hospitalization. Monitoring is required to assess stability.  Hypercholesterolemia   Cholesterol is managed with pravastatin . Continue pravastatin .  Vitamin B12 deficiency   Low B12 levels noted, which can affect neuropathy and anemia. She is not currently taking B12 supplements. Start high-dose B12 supplementation.     Return in about 3 months (around 06/18/2024) for chronic follow-up.  Jenkins CHRISTELLA Carrel, MD     [1]  Current Outpatient Medications:    allopurinol  (ZYLOPRIM ) 300 MG tablet, Take 1 tablet (300 mg total) by mouth daily., Disp: 90 tablet, Rfl: 3   aspirin  EC 81 MG tablet, Take 1 tablet (81 mg total) by mouth daily. Swallow whole., Disp: 90 tablet, Rfl: 3   Cholecalciferol  (VITAMIN D3) 125 MCG (5000 UT) CAPS, Take 5,000 Units by mouth at bedtime., Disp: , Rfl:    diltiazem  (CARDIZEM  CD) 180 MG 24 hr capsule, Take 1 capsule (180 mg total) by mouth daily., Disp: 90 capsule, Rfl: 1   ferrous sulfate  325 (65 FE) MG tablet, Take 325 mg by mouth in the morning., Disp: , Rfl:    fluticasone  (FLONASE ) 50 MCG/ACT nasal spray, Place 2 sprays into both nostrils daily., Disp: 48 g, Rfl: 2   furosemide  (LASIX ) 40 MG tablet, Take 1 tablet (40 mg total) by mouth daily., Disp:  90 tablet, Rfl: 10   glucose blood (TRUE METRIX BLOOD GLUCOSE TEST) test strip, TEST BLOOD SUGAR EVERY DAY AND AS NEEDED, Disp: 200 strip, Rfl: 4   Homeopathic Products (EARACHE DROPS OT), Place 1-2 drops in ear(s) 3 (three) times daily as needed (ear pain.)., Disp: , Rfl:    insulin  degludec (TRESIBA  FLEXTOUCH) 100 UNIT/ML FlexTouch Pen, Inject 26 Units into the skin in the morning., Disp: 30 mL, Rfl: 3   lamoTRIgine  (LAMICTAL ) 200 MG tablet, TAKE 1 TABLET (200 MG TOTAL) BY MOUTH DAILY AT 12 NOON., Disp: 90 tablet, Rfl: 10   Lancets (ONETOUCH DELICA PLUS LANCET33G) MISC, Use as directed to monitor glucose daily and as needed., Disp: 200 each, Rfl: 3   magnesium  oxide (MAG-OX) 400 (240 Mg) MG tablet, Take 400 mg by mouth in the morning., Disp: , Rfl:    methocarbamol  (ROBAXIN ) 500 MG tablet, Take 1 tablet (500 mg total) by mouth every 6 (six) hours as needed for muscle spasms., Disp: 60 tablet, Rfl: 3   omeprazole  (PRILOSEC) 20 MG capsule, TAKE 1 CAPSULE (20 MG TOTAL) BY MOUTH DAILY. TAKE 30 MINUTES BEFORE A MEAL, Disp: 90 capsule, Rfl: 10   ondansetron  (ZOFRAN -ODT) 4 MG disintegrating tablet, Take 1 tablet (4 mg total) by mouth every 4 (four) hours as needed for nausea/vomiting, Disp: 30 tablet, Rfl: 3   oxybutynin  (DITROPAN -XL) 10 MG 24 hr tablet, Take 1 tablet (10 mg total) by mouth at bedtime., Disp: 90 tablet, Rfl: 3   Polyethyl Glycol-Propyl Glycol (SYSTANE ULTRA) 0.4-0.3 % SOLN, Place 1-2 drops into both eyes 3 (three) times daily as needed (dry/irritated eyes)., Disp: , Rfl:    pravastatin  (PRAVACHOL ) 20 MG tablet, Take 1 tablet (20 mg total) by mouth at bedtime., Disp: 90 tablet, Rfl: 2   sulfaSALAzine  (AZULFIDINE ) 500 MG EC tablet, Take 1 tablet (500 mg total) by mouth 4 (four) times daily., Disp: 360 tablet,  Rfl: 1   tamsulosin  (FLOMAX ) 0.4 MG CAPS capsule, Take 1 capsule (0.4 mg total) by mouth at bedtime., Disp: 90 capsule, Rfl: 3   cyclobenzaprine  (FLEXERIL ) 10 MG tablet, Take 1 tablet  (10 mg) by mouth at bedtime as needed for muscle spasms., Disp: 90 tablet, Rfl: 0   doxepin  (SINEQUAN ) 10 MG capsule, Take 1 capsule (10 mg total) by mouth daily., Disp: 90 capsule, Rfl: 1   folic acid  (FOLVITE ) 1 MG tablet, Take 2 tablets (2 mg total) by mouth in the morning., Disp: 180 tablet, Rfl: 0   levothyroxine  (SYNTHROID ) 75 MCG tablet, Take 0.5 tablets (37.5 mcg total) by mouth daily., Disp: 90 tablet, Rfl: 1   metFORMIN  (GLUCOPHAGE ) 500 MG tablet, Take 2 tablets (1,000 mg total) by mouth every morning AND 1 tablet (500 mg total) every evening., Disp: 270 tablet, Rfl: 0   potassium chloride  SA (KLOR-CON  M) 20 MEQ tablet, Take 1 tablet (20 mEq total) by mouth daily., Disp: 90 tablet, Rfl: 0   sertraline  (ZOLOFT ) 100 MG tablet, Take 1 tablet (100 mg total) by mouth at bedtime., Disp: 90 tablet, Rfl: 1 [2] No Known Allergies

## 2024-03-25 ENCOUNTER — Ambulatory Visit: Payer: Self-pay | Admitting: Family Medicine

## 2024-03-25 NOTE — Progress Notes (Signed)
"   Sugars are well controlled-even a little low-decrease Tresiba  to 20 units daily Some protein in urine.  Off lisinopril  due to cough.  Add losartan  25mg -take 1/2 tab daily to protect kidneys.  Monitor bp's.  Check bmp in 1 month (dx high risk meds), to make sure ok with it.   "

## 2024-03-26 NOTE — Progress Notes (Signed)
 Pt has read results [facility]

## 2024-03-27 ENCOUNTER — Ambulatory Visit: Attending: Internal Medicine | Admitting: Internal Medicine

## 2024-03-27 ENCOUNTER — Encounter: Payer: Self-pay | Admitting: Internal Medicine

## 2024-03-27 VITALS — BP 149/84 | HR 74 | Temp 97.1°F | Resp 16 | Ht 61.5 in | Wt 204.4 lb

## 2024-03-27 DIAGNOSIS — L4050 Arthropathic psoriasis, unspecified: Secondary | ICD-10-CM | POA: Diagnosis not present

## 2024-03-27 NOTE — Progress Notes (Signed)
 "  Office Visit Note  Patient: Brittany Acosta             Date of Birth: 1950/12/31           MRN: [id number]             PCP: [name], MD Referring: [name], MD Visit Date: 03/27/2024   Subjective:  Medication Management (Xray results )  Discussed the use of AI scribe software for clinical note transcription with the patient, who gave verbal consent to proceed.  History of Present Illness   Brittany Acosta is a 73 year old female with psoriatic arthritis on sulfasalazine  1000 mg BID who presents with increased hand pain.  She has experienced increased pain in her hands, particularly in the fingers, over the past week. The pain is described as shooting up her fingers, especially the swollen ones, and was severe enough to cause tears last week. No pain in her wrists, and her feet are currently okay.  She has a history of psoriatic arthritis, which has caused joint damage, as evidenced by previous x-rays. Despite being on sulfasalazine , she continues to experience intermittent pain and inflammation in her joints.  Recent blood tests showed an elevated CRP level of 12.9.  She also experiences mouth sores intermittently, sometimes disappearing for two weeks before reappearing. Her flare-ups do not usually last more than a couple of days at a time.      Previous HPI 02/28/24 Brittany Acosta is a 73 year old female with psoriatic arthritis who presents with worsening joint pain and swelling. She is accompanied by her husband, Rogue. She was previously seeing Dr. Curt from [facility] for evaluation of her arthritis and on treatment with sulfasalazine  1000 mg BID.   She has a history of psoriatic arthritis, initially diagnosed as rheumatoid arthritis, which was later changed to psoriatic arthritis about a year or two ago. She experiences significant swelling and pain in her hands, particularly in her fingers, making it difficult to perform daily activities. She is  currently taking sulfasalazine  for her arthritis. She previously tried methotrexate but discontinued it due to mouth ulcers. She has not been on any other specific arthritis medications recently, as she declined injectable medications due to their high cost.   She was recently hospitalized for a pericardial effusion, which was discovered during pre-operative exams for planned shoulder surgery. She spent a month in the hospital but is no longer on colchicine , which she took for about a month.   She has a history of breast cancer, for which she underwent a lobectomy and local radiation on her left breast. She completed five years of anastrozole  treatment, which she stopped in May. She did not undergo systemic chemotherapy.   She reports swelling in her ankles, predominantly on the right side, and has had both knees replaced. She also mentions a history of shoulder issues, having had two rotator cuff surgeries on her right shoulder, which is now worn out. She was planning a reverse shoulder replacement but postponed it due to her recent hospitalization.   She notes changes in her fingernails, which have become unusually hard, and reports that all her joints hurt, with her hands being the worst affected. She has not been using any topical treatments or additional medications for her arthritis recently.    Review of Systems  Constitutional:  Positive for fatigue.  HENT:  Positive for mouth sores and mouth dryness.   Eyes:  Positive for dryness.  Respiratory:  Negative for shortness of breath.   Cardiovascular:  Negative for chest pain and palpitations.  Gastrointestinal:  Positive for constipation. Negative for blood in stool and diarrhea.  Endocrine: Negative for increased urination.  Genitourinary:  Negative for involuntary urination.  Musculoskeletal:  Positive for joint pain, gait problem, joint pain, joint swelling, myalgias, muscle weakness, morning stiffness, muscle tenderness and myalgias.   Skin:  Positive for hair loss. Negative for color change, rash and sensitivity to sunlight.  Allergic/Immunologic: Negative for susceptible to infections.  Neurological:  Positive for dizziness and headaches.  Hematological:  Negative for swollen glands.  Psychiatric/Behavioral:  Positive for sleep disturbance. Negative for depressed mood. The patient is nervous/anxious.     PMFS History:  Patient Active Problem List   Diagnosis Date Noted   Chest pain in adult 02/03/2024   Pleural effusion, bilateral 12/30/2023   Atrial flutter (HCC) 12/15/2023   Acute idiopathic pericarditis 12/15/2023   Pericardial effusion 12/08/2023   Hyponatremia 12/08/2023   Hypomagnesemia 09/09/2022   B12 deficiency 09/09/2022   Iron deficiency anemia due to chronic blood loss 08/10/2022   Psoriatic arthropathy (HCC) 09/20/2021   Carpal tunnel syndrome of left wrist 07/02/2020   Pain of left hand 07/02/2020   Bilateral leg edema 01/25/2020   Family history of heart disease 01/25/2020   Type 2 diabetes mellitus with obesity 01/25/2020   Right carotid bruit 12/25/2018   Stress incontinence in female 12/06/2018   Antalgic gait 12/02/2018   Malignant neoplasm of upper-outer quadrant of left breast in female, estrogen receptor positive (HCC) 05/10/2018   Temporomandibular joint (TMJ) pain 03/23/2018   Tophus of toe concurrent with and due to gout 12/16/2017   Hypotension 03/25/2017   Mass in the abdomen 03/17/2017   Degenerative lumbar spinal stenosis 02/10/2017   Spondylolisthesis at L4-L5 level 02/10/2017   Cervical radiculopathy 01/18/2017   Nausea 12/23/2016   Depression, major, recurrent, moderate (HCC) 11/19/2016   Diabetic neuropathy, painful (HCC) 11/19/2016   Hypothyroidism, 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/19/2016   OSA (obstructive sleep apnea) 11/19/2016   PTSD (post-traumatic stress disorder) 11/19/2016   Arthritis 11/19/2016   Chronic low back pain 11/19/2016   Gout, 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   Poorly  controlled type 2 diabetes mellitus with circulatory disorder (HCC) 06/10/2015   Family history of early CAD 04/18/2015   Right bundle branch block 04/18/2015   Bifascicular block 04/18/2015   Radiculopathy, lumbar region 05/31/2014   Greater trochanteric bursitis of left hip 04/22/2014   Achilles tendinosis 02/19/2014   Gait instability 11/14/2013   Recurrent falls 11/14/2013   Multiple fractures 11/14/2013   Kidney stones 07/25/2013   Hypercalciuria 07/25/2013   Pain in joint involving ankle and foot 07/02/2013   Peripheral polyneuropathy 09/13/2012   Abnormal mammogram 07/28/2012   Migraine 07/10/2012   Osteoarthritis of right knee 02/10/2012   Osteoarthritis of left knee 06/21/2011   GERD (gastroesophageal reflux disease) 02/09/2011   Dyspnea 02/09/2011   Vitamin D  deficiency 08/27/2010   Fibromyalgia 03/23/2010   Goiter 03/24/2009   Nontoxic multinodular goiter 03/24/2009   Low serum potassium level 09/26/2008   Hypocitraturia 09/06/2008   Obesity, morbid (HCC) 09/03/2008   Hypothyroidism 06/10/2008   Hypercholesterolemia 06/10/2008   Bipolar 1 disorder (HCC) 06/10/2008   Hypertension associated with diabetes (HCC) 06/10/2008   Allergic rhinitis 06/10/2008   Asymptomatic postmenopausal status 06/10/2008    Past Medical History:  Diagnosis Date   Allergic rhinitis, cause unspecified    Arthritis    Bipolar disorder, unspecified (HCC)    depression  Cancer (HCC)    skin cancer on left leg, left breast cancer   Cataract    Chronic diastolic heart failure (HCC)    Depression    GERD (gastroesophageal reflux disease)    Headache    History of gout    haven't had it in several years (02/08/2012)   History of kidney stones    History of wrist fracture    rt wrist   Hypertension    Iron deficiency anemia    Morbid obesity (HCC)    Neuropathy due to secondary diabetes (HCC)    Nontoxic multinodular goiter    Osteoporosis    Personal history of radiation therapy     Pneumonia    PONV (postoperative nausea and vomiting)    Psoriatic arthritis (HCC)    PTSD (post-traumatic stress disorder)    Pure hypercholesterolemia    RBBB    Scleroderma (HCC)    Sleep apnea 10/2012   mild osa-did not need cpap -dr clance   Type II diabetes mellitus (HCC)    Unspecified hypothyroidism     Family History  Problem Relation Age of Onset   Hypertension Mother    Lung cancer Mother        non-smoker   Diabetes Father    Hypertension Father    Hyperlipidemia Father    Hypertension Sister    Hypertension Sister    Hypertension Sister    Hypertension Brother    Hypertension Brother    Hypertension Brother    Stomach cancer Maternal Grandmother    Diabetes Daughter    Heart attack Other    Coronary artery disease Other    Diabetes Son    Colon cancer Neg Hx        unsure   Esophageal cancer Neg Hx    Liver cancer Neg Hx    Pancreatic cancer Neg Hx    Rectal cancer Neg Hx    Colon polyps Neg Hx    Past Surgical History:  Procedure Laterality Date   ABDOMINAL HYSTERECTOMY  1976   BREAST EXCISIONAL BIOPSY Left    BREAST LUMPECTOMY Left 04/25/2018   BREAST LUMPECTOMY WITH RADIOACTIVE SEED AND SENTINEL LYMPH NODE BIOPSY Left 04/25/2018   Procedure: LEFT BREAST LUMPECTOMY WITH RADIOACTIVE SEED AND SENTINEL LYMPH NODE BIOPSY;  Surgeon: Mikell Katz, MD;  Location: [facility] OR;  Service: General;  Laterality: Left;   BREAST SURGERY     CARDIAC CATHETERIZATION  2001   sees Dr Peter Jordan   CATARACT EXTRACTION  2014   CATARACT EXTRACTION, BILATERAL  02/2011   epps   CHOLECYSTECTOMY  1985   ESOPHAGOGASTRODUODENOSCOPY (EGD) WITH PROPOFOL  N/A 10/14/2022   Procedure: ESOPHAGOGASTRODUODENOSCOPY (EGD) WITH PROPOFOL ;  Surgeon: Wilhelmenia Aloha Raddle., MD;  Location: [facility];  Service: Gastroenterology;  Laterality: N/A;   EUS N/A 10/14/2022   Procedure: UPPER ENDOSCOPIC ULTRASOUND (EUS) RADIAL;  Surgeon: Wilhelmenia Aloha Raddle., MD;  Location: [facility];  Service: Gastroenterology;  Laterality: N/A;   EXCISIONAL HEMORRHOIDECTOMY     dr cut out in his office (02/08/2012)   INCISIONAL BREAST BIOPSY  2000   right   JOINT REPLACEMENT     rt knee   KNEE ARTHROSCOPY  08/2009   right   KNEE ARTHROSCOPY WITH MEDIAL MENISECTOMY     left   Left Cystoscopy   1990   LEFT OOPHORECTOMY  1980   Lithotripsy (L) Kidney  1997   LUMBAR LAMINECTOMY/DECOMPRESSION MICRODISCECTOMY Left 07/01/2016   Procedure: Laminectomy for synovial cyst - left -  Lumbar four-five;  Surgeon: Victory Gunnels, MD;  Location: [facility] OR;  Service: Neurosurgery;  Laterality: Left;   PARTIAL MASTECTOMY WITH NEEDLE LOCALIZATION Left 09/01/2012   Procedure: PARTIAL MASTECTOMY WITH NEEDLE LOCALIZATION;  Surgeon: Elon CHRISTELLA Pacini, MD;  Location: [facility] OR;  Service: General;  Laterality: Left;   Percitania stone removed (L) Kidney  1992   PERICARDIOCENTESIS N/A 12/09/2023   Procedure: PERICARDIOCENTESIS;  Surgeon: Wonda Sharper, MD;  Location: [facility] INVASIVE CV LAB;  Service: Cardiovascular;  Laterality: N/A;   REVERSE SHOULDER ARTHROPLASTY Right 02/12/2021   Procedure: REVERSE SHOULDER ARTHROPLASTY;  Surgeon: Dozier Soulier, MD;  Location: [facility]S;  Service: Orthopedics;  Laterality: Right;   Right nasal surgery  08/1988   Right sinus removed  08/1989   tooth partial   ROTATOR CUFF REPAIR  2013   right shoulder x 2   SHOULDER ARTHROSCOPY WITH ROTATOR CUFF REPAIR AND SUBACROMIAL DECOMPRESSION Left 08/20/2013   Procedure: SHOULDER ARTHROSCOPY  AND SUBACROMIAL DECOMPRESSION;  Surgeon: Soulier Elsie Dozier, MD;  Location: [facility];  Service: Orthopedics;  Laterality: Left;  Left shoulder arthroscopy, debridement, subacromial decompression, distal clavical resection   THYROIDECTOMY  04/22/2011   Procedure: THYROIDECTOMY;  Surgeon: Vaughan JONETTA Ricker, MD;  Location: [facility] OR;  Service: ENT;  Laterality: N/A;  TOTAL THYROIDECOTMY   TOTAL KNEE ARTHROPLASTY  06/18/2011    Procedure: TOTAL KNEE ARTHROPLASTY;  Surgeon: Dempsey JINNY Sensor, MD;  Location: [facility] OR;  Service: Orthopedics;  Laterality: Left;  DEPUY   TOTAL KNEE ARTHROPLASTY  02/07/2012   Procedure: TOTAL KNEE ARTHROPLASTY;  Surgeon: Dempsey JINNY Sensor, MD;  Location: [facility] OR;  Service: Orthopedics;  Laterality: Right;   TUBAL LIGATION  1972   Social History   Social History Narrative   Married, lives with spouse - 2 adult children   Disabled -- started around 2000 for mental health reasons, Bipolar Disorder/PTSD   Husband works   Immunization History  Administered Date(s) Administered   Fluad Quad(high Dose 65+) 01/22/2021, 12/30/2021, 12/20/2023   INFLUENZA, HIGH DOSE SEASONAL PF 12/06/2012, 12/18/2014, 12/15/2015, 12/15/2016, 01/07/2018   Influenza Split 01/08/2008, 01/13/2009, 04/23/2011, 01/05/2012   Influenza Whole 01/08/2008, 01/13/2009   Influenza, Quadrivalent, Recombinant, Inj, Pf 12/12/2017   Influenza,inj,Quad PF,6+ Mos 12/06/2012, 12/18/2014   Influenza,trivalent, recombinat, inj, PF 04/23/2011, 01/05/2012   Influenza-Unspecified 04/23/2011, 01/05/2012, 12/06/2012, 12/18/2014, 01/16/2020   PFIZER(Purple Top)SARS-COV-2 Vaccination 06/14/2019, 07/10/2019, 12/21/2019   Pneumococcal Conjugate-13 08/08/2017   Pneumococcal Polysaccharide-23 04/23/2011, 06/24/2020   Td 04/05/2004, 04/30/2013, 09/14/2017   Td (Adult),5 Lf Tetanus Toxid, Preservative Free 04/05/2004, 04/30/2013, 09/14/2017   Tdap 09/13/2017   Zoster Recombinant(Shingrix) 09/14/2017, 12/02/2017   Zoster, Live 09/07/2013   Zoster, Unspecified 12/02/2017     Objective: Vital Signs: BP (!) 186/96   Pulse 78   Temp (!) 97.1 F (36.2 C)   Resp 16   Ht 5' 1.5 (1.562 m)   Wt 204 lb 6.4 oz (92.7 kg)   BMI 38.00 kg/m    Physical Exam Eyes:     Conjunctiva/sclera: Conjunctivae normal.  Cardiovascular:     Rate and Rhythm: Normal rate and regular rhythm.  Pulmonary:     Effort: Pulmonary effort is normal.     Breath sounds:  Normal breath sounds.  Lymphadenopathy:     Cervical: No cervical adenopathy.  Skin:    General: Skin is warm and dry.  Neurological:     Mental Status: She is alert.  Psychiatric:        Mood and Affect: Mood normal.  Musculoskeletal Exam:  Shoulders full ROM no tenderness or swelling Elbows full ROM no tenderness or swelling Wrists full ROM no tenderness or swelling Fingers heberdon's nodes, worst deformities 2nd-3rd DIPs with some lateral deviation and erythema and mildly tender to pressure, no palpable synovitis, no dactylitis Knees full ROM postoperative changes, right knee some laxity/crepitus with movement, no effusions Ankles full ROM no tenderness or swelling, nontender nodules or swelling on posterior of achilles tendon b/l   Investigation: No additional findings.  Imaging: No results found.  Recent Labs: Lab Results  Component Value Date   WBC 7.0 03/20/2024   HGB 11.4 (L) 03/20/2024   PLT 440.0 (H) 03/20/2024   NA 138 03/20/2024   K 4.0 03/20/2024   CL 99 03/20/2024   CO2 30 03/20/2024   GLUCOSE 68 (L) 03/20/2024   BUN 15 03/20/2024   CREATININE 0.60 03/20/2024   BILITOT 0.2 03/20/2024   ALKPHOS 100 03/20/2024   AST 20 03/20/2024   ALT 12 03/20/2024   PROT 7.0 03/20/2024   ALBUMIN  3.9 03/20/2024   CALCIUM  9.4 03/20/2024   GFRAA >60 09/14/2019    Speciality Comments: No specialty comments available.  Procedures:  No procedures performed Allergies: Patient has no known allergies.   Assessment / Plan:     Visit Diagnoses: Psoriatic arthropathy (HCC) Psoriatic arthritis Active inflammation with elevated CRP and joint damage on X-rays. Current sulfasalazine  treatment insufficient.  Based on erosive joint damage and clinical picture will recommend switch to Otezla as next.  If she cannot tolerate this or sees poor response would also consider leflunomide  but less likely to do well given methotrexate intolerance. - Otezla (apremilast) 30 mg twice  daily after titration - Continue sulfasalazine  until Otezla is started. - Monitor for side effects, particularly diarrhea, and adjust treatment if necessary. - Consider Arava  (leflunomide ) if Otezla is not feasible, with regular liver function tests every three months. - Educated on potential flare-ups during transition period and advised to report prolonged flare-ups.  Oral mucositis Intermittent episodes expected to improve with Otezla. - Initiated Otezla (apremilast) for oral mucositis.       Orders: No orders of the defined types were placed in this encounter.  No orders of the defined types were placed in this encounter.    Follow-Up Instructions: No follow-ups on file.   Lonni LELON Ester, MD  Note - This record has been created using [employer].  Chart creation errors have been sought, but may not always  have been located. Such creation errors do not reflect on  the standard of medical care. "

## 2024-03-28 ENCOUNTER — Telehealth: Payer: Self-pay

## 2024-03-28 NOTE — Telephone Encounter (Signed)
 Copied from CRM #[id number]. Topic: Clinical - Prescription Issue >> Mar 28, 2024 11:22 AM Macario HERO wrote: Reason for CRM: Patient called said her provider prescribed Losartan  in her lab results but never received medication at the pharmacy. Requesting clarity from nurse.

## 2024-03-30 ENCOUNTER — Other Ambulatory Visit: Payer: Self-pay | Admitting: Family Medicine

## 2024-03-30 ENCOUNTER — Other Ambulatory Visit (HOSPITAL_COMMUNITY): Payer: Self-pay

## 2024-03-30 DIAGNOSIS — Z79899 Other long term (current) drug therapy: Secondary | ICD-10-CM

## 2024-03-30 MED ORDER — LOSARTAN POTASSIUM 25 MG PO TABS
12.5000 mg | ORAL_TABLET | Freq: Every day | ORAL | 1 refills | Status: DC
  Filled 2024-03-30: qty 30, 60d supply, fill #0

## 2024-03-30 NOTE — Telephone Encounter (Signed)
 LVM for Patient to schedule Lab only appointment

## 2024-04-06 ENCOUNTER — Other Ambulatory Visit (INDEPENDENT_AMBULATORY_CARE_PROVIDER_SITE_OTHER)

## 2024-04-06 DIAGNOSIS — Z79899 Other long term (current) drug therapy: Secondary | ICD-10-CM

## 2024-04-06 LAB — BASIC METABOLIC PANEL WITH GFR
BUN: 21 mg/dL (ref 6–23)
CO2: 31 meq/L (ref 19–32)
Calcium: 9.1 mg/dL (ref 8.4–10.5)
Chloride: 95 meq/L — ABNORMAL LOW (ref 96–112)
Creatinine, Ser: 0.71 mg/dL (ref 0.40–1.20)
GFR: 84.21 mL/min
Glucose, Bld: 109 mg/dL — ABNORMAL HIGH (ref 70–99)
Potassium: 4 meq/L (ref 3.5–5.1)
Sodium: 136 meq/L (ref 135–145)

## 2024-04-08 ENCOUNTER — Ambulatory Visit: Payer: Self-pay | Admitting: Family Medicine

## 2024-04-08 NOTE — Progress Notes (Signed)
 Ok.  Same meds

## 2024-04-09 NOTE — Progress Notes (Signed)
Pt has read results.

## 2024-04-23 ENCOUNTER — Other Ambulatory Visit: Payer: Self-pay

## 2024-04-23 ENCOUNTER — Other Ambulatory Visit: Payer: Self-pay | Admitting: Family Medicine

## 2024-04-23 ENCOUNTER — Other Ambulatory Visit (HOSPITAL_COMMUNITY): Payer: Self-pay

## 2024-04-23 MED ORDER — FUROSEMIDE 40 MG PO TABS
40.0000 mg | ORAL_TABLET | Freq: Every day | ORAL | 1 refills | Status: AC
  Filled 2024-04-23: qty 90, 90d supply, fill #0

## 2024-04-23 MED ORDER — LAMOTRIGINE 200 MG PO TABS
200.0000 mg | ORAL_TABLET | Freq: Every day | ORAL | 1 refills | Status: AC
  Filled 2024-04-23: qty 90, 90d supply, fill #0

## 2024-04-23 MED ORDER — OMEPRAZOLE 20 MG PO CPDR
20.0000 mg | DELAYED_RELEASE_CAPSULE | Freq: Every day | ORAL | 1 refills | Status: AC
  Filled 2024-04-23: qty 90, 90d supply, fill #0

## 2024-04-24 ENCOUNTER — Other Ambulatory Visit (HOSPITAL_COMMUNITY): Payer: Self-pay

## 2024-04-27 ENCOUNTER — Other Ambulatory Visit (HOSPITAL_COMMUNITY): Payer: Self-pay

## 2024-05-03 ENCOUNTER — Other Ambulatory Visit (HOSPITAL_COMMUNITY): Payer: Self-pay

## 2024-05-03 ENCOUNTER — Other Ambulatory Visit: Payer: Self-pay

## 2024-05-09 NOTE — Progress Notes (Signed)
 " Cardiology Office Note:  .   Date:  05/10/2024  ID:  Brittany Acosta, DOB Mar 18, 1951, MRN [id number] PCP: [name], MD  [facility] Providers Cardiologist:  Shelda Bruckner, MD {  History of Present Illness: .   Brittany Acosta is a 74 y.o. female with a hx of hypertension, OSA, type II diabetes with diabetic neuropathy, hypercholesterolemia, obesity, fibromyalgia who is seen for follow up.  Today: Blood pressures have been higher than normal. No recent changes. No recent illnesses. Has chronic severe pain in her hands.  Started losartan  in December for kidney protection, taking 1/2 tab of losartan  25 mg and tolerating. Renal function ok after starting this. Discussed increasing to full tab and monitoring blood pressures.  Swelling in her legs continues to be chronic issue. Comes and goes, stable, no acute worsening.  ROS: Denies chest pain, shortness of breath at rest or with normal exertion. No PND, orthopnea, or unexpected weight gain. No syncope or palpitations. ROS otherwise negative except as noted.   Studies Reviewed: SABRA    EKG:       Physical Exam:   VS:  BP (!) 140/76 (BP Location: Right Arm, Patient Position: Sitting, Cuff Size: Normal)   Pulse 74   Ht 5' 1.5 (1.562 m)   Wt 208 lb 14.4 oz (94.8 kg)   SpO2 96%   BMI 38.83 kg/m    Wt Readings from Last 3 Encounters:  05/10/24 208 lb 14.4 oz (94.8 kg)  03/27/24 204 lb 6.4 oz (92.7 kg)  03/20/24 201 lb 2 oz (91.2 kg)    GEN: Well nourished, well developed in no acute distress HEENT: Normal, moist mucous membranes NECK: No JVD CARDIAC: regular rhythm with occasional ectopy, normal S1 and S2, no rubs or gallops. No murmur. VASCULAR: Radial and DP pulses 2+ bilaterally. R carotid bruit RESPIRATORY:  Clear to auscultation without rales, wheezing or rhonchi  ABDOMEN: Soft, non-tender, non-distended MUSCULOSKELETAL:  Ambulates independently SKIN: Warm and dry, no edema NEUROLOGIC:  Alert and oriented x 3.  No focal neuro deficits noted. PSYCHIATRIC:  Normal affect    ASSESSMENT AND PLAN: .    Hypertension: -not at goal today -changed from lisinopril  to losartan , will increase dose to 25 mg daily and monitor BP. If BP not consistently <130/80, can increase the dose further at her follow up with Dr. Wendolyn in just over a month -recheck BMET. If K>4 suspect she can stop the potassium supplementation -continue furosemide  -continue diltiazem  as below  Possible microvascular dysfunction Trivial CAD Family history of CV disease Type II diabetes, with obesity -admitted 01/2024 with chest pain, started on diltiazem  due to concern for microvascular dysfunction from cardiac PET 08/2023. No chest pain since -prior coronary CT in 2022 without significant CAD beyond calcification at ostial left main, Ca score of 2 -continue aspirin  -she is on pravastatin . We having discussed changing to a higher intensity statin, she declines as she is tolerating this and is concerned about myalgia  History of pericardial effusion s/p pericardiocentesis 12/2023 2/2 acute idiopathic pericarditis -did not tolerate colchicine  -has brief afib but felt to be 2/2 pericarditis  Lower extremity edema, intermittent -most consistent with venous insufficiency -reviewed prior testing -counseled on compression stockings, elevation    Abnormal ECG: RBBB, LAFB (bifasicular block) -no syncope or red flag symptoms   R carotid bruit -duplex without plaque/stenosis. May just be tortuous vessel  CV risk counseling and prevention -recommend heart healthy/Mediterranean diet, with whole grains, fruits, vegetable, fish, lean  meats, nuts, and olive oil. Limit salt. -recommend moderate walking, 3-5 times/week for 30-50 minutes each session. Aim for at least 150 minutes.week. Goal should be pace of 3 miles/hours, or walking 1.5 miles in 30 minutes -recommend avoidance of tobacco products. Avoid excess alcohol.  Dispo: follow up in 6  months or sooner as help  Signed, Shelda Bruckner, MD   Shelda Bruckner, MD, PhD, [facility] [place]  [facility]  [place]  Heart & Vascular at [facility] at [facility] [address] [place], [place] [address] [phone]   "

## 2024-05-10 ENCOUNTER — Ambulatory Visit (INDEPENDENT_AMBULATORY_CARE_PROVIDER_SITE_OTHER): Admitting: Cardiology

## 2024-05-10 ENCOUNTER — Encounter (HOSPITAL_BASED_OUTPATIENT_CLINIC_OR_DEPARTMENT_OTHER): Payer: Self-pay | Admitting: Cardiology

## 2024-05-10 ENCOUNTER — Other Ambulatory Visit (HOSPITAL_COMMUNITY): Payer: Self-pay

## 2024-05-10 VITALS — BP 140/76 | HR 74 | Ht 61.5 in | Wt 208.9 lb

## 2024-05-10 DIAGNOSIS — R9431 Abnormal electrocardiogram [ECG] [EKG]: Secondary | ICD-10-CM

## 2024-05-10 DIAGNOSIS — I2089 Other forms of angina pectoris: Secondary | ICD-10-CM

## 2024-05-10 DIAGNOSIS — R600 Localized edema: Secondary | ICD-10-CM

## 2024-05-10 DIAGNOSIS — I10 Essential (primary) hypertension: Secondary | ICD-10-CM | POA: Diagnosis not present

## 2024-05-10 DIAGNOSIS — Z8679 Personal history of other diseases of the circulatory system: Secondary | ICD-10-CM | POA: Diagnosis not present

## 2024-05-10 DIAGNOSIS — Z79899 Other long term (current) drug therapy: Secondary | ICD-10-CM | POA: Diagnosis not present

## 2024-05-10 MED ORDER — LOSARTAN POTASSIUM 25 MG PO TABS
25.0000 mg | ORAL_TABLET | Freq: Every day | ORAL | 1 refills | Status: AC
  Filled 2024-05-10: qty 30, 30d supply, fill #0

## 2024-05-10 NOTE — Patient Instructions (Addendum)
 Medication Instructions:   Increase the losartan  to a full pill of 25 mg daily.  *If you need a refill on your cardiac medications before your next appointment, please call your pharmacy*  Lab Work:  Your physician recommends that you return for lab work in two weeks. No fasting.    If you have labs (blood work) drawn today and your tests are completely normal, you will receive your results only by: MyChart Message (if you have MyChart) OR A paper copy in the mail If you have any lab test that is abnormal or we need to change your treatment, we will call you to review the results.  Testing/Procedures:  None ordered.  Follow-Up: At [facility], you and your health needs are our priority.  As part of our continuing mission to provide you with exceptional heart care, our providers are all part of one team.  This team includes your primary Cardiologist (physician) and Advanced Practice Providers or APPs (Physician Assistants and Nurse Practitioners) who all work together to provide you with the care you need, when you need it.  Your next appointment:   6 month(s)  Provider:   Shelda Bruckner, MD, Rosaline Bane, NP, or Reche Finder, NP    We recommend signing up for the patient portal called MyChart.  Sign up information is provided on this After Visit Summary.  MyChart is used to connect with patients for Virtual Visits (Telemedicine).  Patients are able to view lab/test results, encounter notes, upcoming appointments, etc.  Non-urgent messages can be sent to your provider as well.   To learn more about what you can do with MyChart, go to [email or web address].   Other Instructions  Your physician wants you to follow-up in: 6 months.  You will receive a reminder letter in the mail two months in advance. If you don't receive a letter, please call our office to schedule the follow-up appointment.             Check blood pressures at home (see instructions  below)   how to check blood pressure:  -sit comfortably in a chair, feet uncrossed and flat on floor, for 5-10 minutes  -arm ideally should rest at the level of the heart. However, arm should be relaxed and not tense (for example, do not hold the arm up unsupported)  -avoid exercise, caffeine, and tobacco for at least 30 minutes prior to BP reading  -don't take BP cuff reading over clothes (always place on skin directly)  -I prefer to know how well the medication is working, so I would like you to take your readings 1-2 hours after taking your blood pressure medication if possible

## 2024-06-18 ENCOUNTER — Ambulatory Visit: Admitting: Family Medicine

## 2024-06-26 ENCOUNTER — Ambulatory Visit: Admitting: Internal Medicine
